# Patient Record
Sex: Female | Born: 1945 | Race: White | Hispanic: No | Marital: Single | Smoking: Former smoker
Health system: Southern US, Community
[De-identification: ages and names within clinical notes are randomized; demographics above are authoritative.]

## PROBLEM LIST (undated history)

## (undated) DIAGNOSIS — K648 Other hemorrhoids: Secondary | ICD-10-CM

## (undated) DIAGNOSIS — Z9289 Personal history of other medical treatment: Secondary | ICD-10-CM

## (undated) DIAGNOSIS — Z923 Personal history of irradiation: Secondary | ICD-10-CM

## (undated) DIAGNOSIS — F419 Anxiety disorder, unspecified: Secondary | ICD-10-CM

## (undated) DIAGNOSIS — K5790 Diverticulosis of intestine, part unspecified, without perforation or abscess without bleeding: Secondary | ICD-10-CM

## (undated) DIAGNOSIS — J45909 Unspecified asthma, uncomplicated: Secondary | ICD-10-CM

## (undated) DIAGNOSIS — I4819 Other persistent atrial fibrillation: Secondary | ICD-10-CM

## (undated) DIAGNOSIS — C3490 Malignant neoplasm of unspecified part of unspecified bronchus or lung: Secondary | ICD-10-CM

## (undated) DIAGNOSIS — J189 Pneumonia, unspecified organism: Secondary | ICD-10-CM

## (undated) DIAGNOSIS — Z8719 Personal history of other diseases of the digestive system: Secondary | ICD-10-CM

## (undated) DIAGNOSIS — I495 Sick sinus syndrome: Secondary | ICD-10-CM

## (undated) DIAGNOSIS — M1990 Unspecified osteoarthritis, unspecified site: Secondary | ICD-10-CM

## (undated) DIAGNOSIS — Z950 Presence of cardiac pacemaker: Secondary | ICD-10-CM

## (undated) DIAGNOSIS — Z87442 Personal history of urinary calculi: Secondary | ICD-10-CM

## (undated) DIAGNOSIS — K219 Gastro-esophageal reflux disease without esophagitis: Secondary | ICD-10-CM

## (undated) DIAGNOSIS — Z9221 Personal history of antineoplastic chemotherapy: Secondary | ICD-10-CM

## (undated) DIAGNOSIS — I480 Paroxysmal atrial fibrillation: Principal | ICD-10-CM

## (undated) DIAGNOSIS — R911 Solitary pulmonary nodule: Secondary | ICD-10-CM

## (undated) DIAGNOSIS — K8020 Calculus of gallbladder without cholecystitis without obstruction: Secondary | ICD-10-CM

## (undated) DIAGNOSIS — Z5112 Encounter for antineoplastic immunotherapy: Secondary | ICD-10-CM

## (undated) DIAGNOSIS — I4892 Unspecified atrial flutter: Secondary | ICD-10-CM

## (undated) DIAGNOSIS — J309 Allergic rhinitis, unspecified: Secondary | ICD-10-CM

## (undated) HISTORY — PX: ESOPHAGOGASTRODUODENOSCOPY: SHX1529

## (undated) HISTORY — DX: Encounter for antineoplastic immunotherapy: Z51.12

## (undated) HISTORY — DX: Paroxysmal atrial fibrillation: I48.0

## (undated) HISTORY — DX: Solitary pulmonary nodule: R91.1

## (undated) HISTORY — DX: Unspecified asthma, uncomplicated: J45.909

## (undated) HISTORY — DX: Calculus of gallbladder without cholecystitis without obstruction: K80.20

## (undated) HISTORY — PX: PARTIAL HYSTERECTOMY: SHX80

## (undated) HISTORY — DX: Unspecified atrial flutter: I48.92

## (undated) HISTORY — PX: COLONOSCOPY: SHX174

## (undated) HISTORY — DX: Allergic rhinitis, unspecified: J30.9

## (undated) HISTORY — DX: Personal history of urinary calculi: Z87.442

## (undated) HISTORY — DX: Other persistent atrial fibrillation: I48.19

## (undated) HISTORY — DX: Sick sinus syndrome: I49.5

---

## 2012-06-20 DIAGNOSIS — I4891 Unspecified atrial fibrillation: Secondary | ICD-10-CM

## 2012-06-22 ENCOUNTER — Other Ambulatory Visit: Payer: Self-pay | Admitting: *Deleted

## 2012-06-22 DIAGNOSIS — I480 Paroxysmal atrial fibrillation: Secondary | ICD-10-CM

## 2012-06-24 ENCOUNTER — Encounter (INDEPENDENT_AMBULATORY_CARE_PROVIDER_SITE_OTHER): Payer: Medicare Other | Admitting: *Deleted

## 2012-06-24 ENCOUNTER — Encounter: Payer: Self-pay | Admitting: *Deleted

## 2012-06-24 DIAGNOSIS — I4891 Unspecified atrial fibrillation: Secondary | ICD-10-CM

## 2012-06-24 DIAGNOSIS — I4892 Unspecified atrial flutter: Secondary | ICD-10-CM

## 2012-06-24 DIAGNOSIS — I480 Paroxysmal atrial fibrillation: Secondary | ICD-10-CM

## 2012-07-08 ENCOUNTER — Ambulatory Visit (INDEPENDENT_AMBULATORY_CARE_PROVIDER_SITE_OTHER): Payer: Medicare Other | Admitting: Physician Assistant

## 2012-07-08 ENCOUNTER — Encounter: Payer: Self-pay | Admitting: Physician Assistant

## 2012-07-08 VITALS — BP 147/80 | HR 70 | Ht 62.0 in | Wt 158.8 lb

## 2012-07-08 DIAGNOSIS — I4891 Unspecified atrial fibrillation: Secondary | ICD-10-CM

## 2012-07-08 DIAGNOSIS — I480 Paroxysmal atrial fibrillation: Secondary | ICD-10-CM

## 2012-07-08 MED ORDER — DILTIAZEM HCL ER COATED BEADS 120 MG PO CP24: 120.0000 mg | ORAL_CAPSULE | Freq: Every day | ORAL | Status: DC

## 2012-07-08 MED ORDER — ASPIRIN EC 81 MG PO TBEC: 81.0000 mg | DELAYED_RELEASE_TABLET | Freq: Every day | ORAL | Status: DC

## 2012-07-08 NOTE — Assessment & Plan Note (Addendum)
The results of the recent 48-hour Holter monitor were reviewed with Dr. Antoine Poche, who felt that the rhythm strips are most suggestive of atrial flutter. However, the initial twelve-lead EKG at [facility] was most suggestive of atrial fibrillation, at approximately 140 bpm. Given this, plan is to refer her to Dr. Johney Frame of our EP service, to discuss the various possible therapeutic options for her. Although we considered the possibility of RF ablation, we note that this may not be feasible, given documented strips which appeared to be more suggestive of atrial fibrillation. She may be a good candidate for flecainide therapy. Of note, she presents with no known history of heart disease, and we will screen with a baseline echocardiogram for assessment of EF and rule out of any underlying structural abnormalities. In the meanwhile, we will decrease Cardizem to 120 mg daily, in light of the noted multiple pauses. We will also start her on ASA 81 mg daily, given her CHADS score of 0. If echocardiogram is within normal limits, then no further workup is indicated and we will have her return to Dr. Antoine Poche, here in our [place] clinic, on an as-needed basis.

## 2012-07-08 NOTE — Patient Instructions (Addendum)
   Echo  Office will contact with results  Begin Aspirin 81mg  daily  Decrease caffeine  Decrease Cardizem CD to 120mg  daily  Refer to Dr. Johney Frame  Continue all current other medications.  Follow up as needed

## 2012-07-08 NOTE — Progress Notes (Signed)
Primary Cardiologist: Rollene Rotunda, MD (new)  HPI: Post hospital followup from [facility], seen in consultation for new onset PAF. Assessed a CHADS of 0, and discharged on calcium channel blocker. Recommendation was to pursue outpatient evaluation with a 48-hour Holter monitor to assess AF burden.   - Holter Monitor: Intermittent AF/flutter; max HR approximately 160 bpm; multiple post termination pauses of greater than 2.5 seconds, 1 pause of 3.1 seconds.  Since her recent rehospitalization, she denies any recurrent palpitations. Of note, she did not actually feel tachycardia palpitations at time of her presentation, but has had these symptoms intermittently in the past. She actually presented because of symptoms related to her sinusitis.  While wearing the Holter monitor, she did experience occasional "weak spells", but denied any frank syncope or falls.  Allergies  Allergen Reactions  . Penicillins   . Prednisone   . Septra (Sulfamethoxazole W-Trimethoprim)   . Vioxx (Rofecoxib)     Current Outpatient Prescriptions  Medication Sig Dispense Refill  . fluticasone (FLONASE) 50 MCG/ACT nasal spray Place 2 sprays into the nose daily.      [name] aspirin EC 81 MG tablet Take 1 tablet (81 mg total) by mouth daily.      [name] diltiazem (CARDIZEM CD) 120 MG 24 hr capsule Take 1 capsule (120 mg total) by mouth daily.  30 capsule  6    Past Medical History  Diagnosis Date  . Asthma   . History of sinusitis   . Allergic rhinitis   . History of kidney stones   . Paroxysmal atrial fibrillation     CHADS score: 0    Past Surgical History  Procedure Date  . Partial hysterectomy     History   Social History  . Marital Status: Divorced    Spouse Name: N/A    Number of Children: N/A  . Years of Education: N/A   Occupational History  . Not on file.   Social History Main Topics  . Smoking status: Former Smoker -- 2.0 packs/day for 40 years    Types: Cigarettes    Quit date: 08/04/1981  .  Smokeless tobacco: Not on file  . Alcohol Use: No  . Drug Use: No  . Sexually Active: Not on file   Other Topics Concern  . Not on file   Social History Narrative   Patient lives alonePatient has 1 living daughter     Family History  Problem Relation Age of Onset  . Heart disease Mother   . Cancer Mother   . Diabetes Mother   . Asthma Father   . Heart disease Father   . Cancer Brother   . Diabetes Sister   . Cancer Sister   . Diabetes Brother   . Heart attack Son     ROS: no nausea, vomiting; no fever, chills; no melena, hematochezia; no claudication  PHYSICAL EXAM: BP 147/80  Pulse 70  Ht 5\' 2"  (1.575 m)  Wt 158 lb 12.8 oz (72.031 kg)  BMI 29.04 kg/m2 GENERAL: 66 year old female; NAD HEENT: NCAT, PERRLA, EOMI; sclera clear; no xanthelasma NECK: palpable bilateral carotid pulses, no bruits; no JVD; no TM LUNGS: CTA bilaterally CARDIAC: RRR (S1, S2); no significant murmurs; no rubs or gallops ABDOMEN: Protuberant EXTREMETIES: no significant peripheral edema SKIN: warm/dry; no obvious rash/lesions MUSCULOSKELETAL: no joint deformity NEURO: no focal deficit; NL affect   EKG: reviewed and available in Electronic Records   ASSESSMENT & PLAN:  Paroxysmal atrial fibrillation The results of the recent 48-hour Holter monitor were  reviewed with Dr. Antoine Poche, who felt that the rhythm strips are most suggestive of atrial flutter. However, the initial twelve-lead EKG at [facility] was most suggestive of atrial fibrillation, at approximately 140 bpm. Given this, plan is to refer her to Dr. Johney Frame of our EP service, to discuss the various possible therapeutic options for her. Although we considered the possibility of RF ablation, we note that this may not be feasible, given documented strips which appeared to be more suggestive of atrial fibrillation. She may be a good candidate for flecainide therapy. Of note, she presents with no known history of heart disease, and we  will screen with a baseline echocardiogram for assessment of EF and rule out of any underlying structural abnormalities. In the meanwhile, we will decrease Cardizem to 120 mg daily, in light of the noted multiple pauses. We will also start her on ASA 81 mg daily, given her CHADS score of 0. If echocardiogram is within normal limits, then no further workup is indicated and we will have her return to Dr. Antoine Poche, here in our [place] clinic, on an as-needed basis.    Gene Melodie Ashworth, PAC

## 2012-07-12 ENCOUNTER — Other Ambulatory Visit: Payer: Self-pay

## 2012-08-05 ENCOUNTER — Other Ambulatory Visit (INDEPENDENT_AMBULATORY_CARE_PROVIDER_SITE_OTHER): Payer: Medicaid Other

## 2012-08-05 ENCOUNTER — Other Ambulatory Visit: Payer: Self-pay

## 2012-08-05 DIAGNOSIS — I480 Paroxysmal atrial fibrillation: Secondary | ICD-10-CM

## 2012-08-05 DIAGNOSIS — I4891 Unspecified atrial fibrillation: Secondary | ICD-10-CM

## 2012-08-06 ENCOUNTER — Telehealth: Payer: Self-pay | Admitting: *Deleted

## 2012-08-06 NOTE — Telephone Encounter (Signed)
Message copied by Lesle Chris on Fri Aug 06, 2012 10:51 AM ------      Message from: Nancy Hodges      Created: Thu Aug 05, 2012  1:57 PM       NL LVF. No further w/u. Pt to f/u with [facility] on prn basis.

## 2012-08-06 NOTE — Telephone Encounter (Signed)
Notes Recorded by Lesle Chris, LPN on 08/09/1094 at 10:51 AM Patient notified. ------  Notes Recorded by Lesle Chris, LPN on 0/11/5407 at 9:28 AM Left message to return call.

## 2012-08-20 ENCOUNTER — Institutional Professional Consult (permissible substitution): Payer: Medicare Other | Admitting: Internal Medicine

## 2012-09-06 ENCOUNTER — Encounter: Payer: Self-pay | Admitting: Internal Medicine

## 2012-09-06 ENCOUNTER — Ambulatory Visit (INDEPENDENT_AMBULATORY_CARE_PROVIDER_SITE_OTHER): Payer: Medicare Other | Admitting: Internal Medicine

## 2012-09-06 VITALS — BP 114/68 | HR 76 | Ht 62.0 in | Wt 162.0 lb

## 2012-09-06 DIAGNOSIS — I480 Paroxysmal atrial fibrillation: Secondary | ICD-10-CM

## 2012-09-06 DIAGNOSIS — I4891 Unspecified atrial fibrillation: Secondary | ICD-10-CM

## 2012-09-06 DIAGNOSIS — I495 Sick sinus syndrome: Secondary | ICD-10-CM | POA: Insufficient documentation

## 2012-09-06 NOTE — Assessment & Plan Note (Signed)
The patient has minimally symptomatic atrial fibrillation and atrial flutter.  Recent post termination pauses have made rate control difficult.  She is on low dose metoprolol at this time.  Though her CHADS2 score is 0, her CHASDS2VASC score is 2 (female, age >70).  We has a long discussion today regarding the need for anticoagulation.  She is very clear in her decision to decline anticoagulation going forward. Given her paucity of symptoms, I do not think that she would receive benefit from an AAD or ablation presently.

## 2012-09-06 NOTE — Progress Notes (Signed)
Primary Care Physician: [facility] Referring Physician:  Gene Serpe   Nancy Hodges is a 67 y.o. female with a h/o recently diagnosed atrial fibrillation who presents today for EP consultation.  She reports that in October that she developed a URI with cough.  She presented to [facility] and was found to have afib with RVR.  She was admitted to [facility] for medical therapy.  She was treated with diltiazem for rate control.  She had a holter monitor placed which documented intermittent atrial fibrillation and atrial flutter with rapid ventricular rates up to 160 bpm as well as several pauses up to 3.1 seconds. She denies presyncope or syncope.  She had several "weak" spells lasting several seconds likely due to pauses.   Her diltiazem was decreased to 120mg  daily.  The "weak spells" have stopped since that time.  Today, she denies symptoms of palpitations, chest pain, shortness of breath, orthopnea, PND, lower extremity edema, or neurologic sequela. The patient is tolerating medications without difficulties and is otherwise without complaint today.   Past Medical History  Diagnosis Date  . Asthma   . History of sinusitis   . Allergic rhinitis   . History of kidney stones   . Paroxysmal atrial fibrillation     CHADS score: 0  . Atrial flutter    Past Surgical History  Procedure Date  . Partial hysterectomy     Current Outpatient Prescriptions  Medication Sig Dispense Refill  . aspirin EC 81 MG tablet Take 1 tablet (81 mg total) by mouth daily.      [name] diltiazem (CARDIZEM CD) 120 MG 24 hr capsule Take 1 capsule (120 mg total) by mouth daily.  30 capsule  6  . fluticasone (FLONASE) 50 MCG/ACT nasal spray Place 2 sprays into the nose daily as needed.         Allergies  Allergen Reactions  . Penicillins   . Prednisone   . Septra (Sulfamethoxazole W-Trimethoprim)   . Vioxx (Rofecoxib)     History   Social History    . Marital Status: Divorced    Spouse Name: N/A    Number of Children: N/A  . Years of Education: N/A   Occupational History  . Not on file.   Social History Main Topics  . Smoking status: Former Smoker -- 2.0 packs/day for 40 years    Types: Cigarettes    Quit date: 08/04/1981  . Smokeless tobacco: Not on file  . Alcohol Use: No  . Drug Use: No  . Sexually Active: Not on file   Other Topics Concern  . 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  Divorced.Patient has 1 living daughter     Family History  Problem Relation Age of Onset  . Heart disease Mother   . Cancer Mother   . Diabetes Mother   . Asthma Father   . Heart disease Father   . Cancer Brother   . Diabetes Sister   . Cancer Sister   . Diabetes Brother   . Heart attack Son     ROS- All systems are reviewed and negative except as per the HPI above  Physical Exam: Filed Vitals:   09/06/12 1026  BP: 114/68  Pulse: 76  Height: 5\' 2"  (1.575 m)  Weight: 162 lb (73.483 kg)    GEN- The patient is well appearing, alert and oriented x 3 today.  Head- normocephalic, atraumatic Eyes-  Sclera clear, conjunctiva pink Ears- hearing intact Oropharynx- clear Neck- supple, no JVP Lymph- no cervical lymphadenopathy Lungs- Clear to ausculation bilaterally, normal work of breathing Heart- irregular rate and rhythm, no murmurs, rubs or gallops, PMI not laterally displaced GI- soft, NT, ND, + BS Extremities- no clubbing, cyanosis, or edema MS- no significant deformity or atrophy Skin- no rash or lesion Psych- euthymic mood, full affect Neuro- strength and sensation are intact  Echo, EKG and event monitor are reviewed in epic  Assessment and Plan:

## 2012-09-06 NOTE — Patient Instructions (Addendum)
Your physician recommends that you schedule a follow-up appointment in: 2 months with Dr Allred  

## 2012-09-06 NOTE — Assessment & Plan Note (Signed)
Recent holter documents afib with RVR as well as post termination pauses.  She has had pauses of 3 seconds which have made rate control difficult.  She reports brief "weakness" with these events but has not had frank syncope. I have strongly recommended pacemaker implantation at this time.  I have offered enrollment in our Leadless II pacemaker trial as I think that VVI backup pacing would be sufficient to resolve pauses and allow for adequate rate control of her afib.  She is clear in her decision to decline any EP procedures at this point. I have instructed her to contact me immediately should she develop worsening presyncope or syncope.  She voices understanding and willingness to comply.  She is aware of risks of syncope and injury with pauses. I will make no medicine changes today.

## 2012-09-07 ENCOUNTER — Telehealth: Payer: Self-pay | Admitting: Internal Medicine

## 2012-09-07 NOTE — Telephone Encounter (Signed)
Tachycardia-bradycardia syndrome - Hillis Range, MD 09/06/2012 10:03 PM Signed  Recent holter documents afib with RVR as well as post termination pauses. She has had pauses of 3 seconds which have made rate control difficult. She reports brief "weakness" with these events but has not had frank syncope.  I have strongly recommended pacemaker implantation at this time. I have offered enrollment in our Leadless II pacemaker trial as I think that VVI backup pacing would be sufficient to resolve pauses and allow for adequate rate control of her afib. She is clear in her decision to decline any EP procedures at this point.  I have instructed her to contact me immediately should she develop worsening presyncope or syncope. She voices understanding and willingness to comply. She is aware of risks of syncope and injury with pauses.  I will make no medicine changes today.    I have called patient and lmom for her that I will call her back on Thurs after I discuss with Dr Johney Frame and get her plan of care

## 2012-09-07 NOTE — Telephone Encounter (Signed)
New Problem:    Patient called in wanting to schedule her ICD implant procedure as soon as possible.  Please call back.

## 2012-09-13 NOTE — Telephone Encounter (Signed)
Patient is going to be at the hospital tomorrow for the leadless PPM implant at 1

## 2012-09-13 NOTE — Telephone Encounter (Signed)
Follow-up:    Patient called in to follow-up on her previous call.  Please call back. After 2pm please call [phone].

## 2012-09-13 NOTE — Telephone Encounter (Signed)
lmom for patient to call me back. 

## 2012-09-14 ENCOUNTER — Encounter (HOSPITAL_COMMUNITY)
Admission: RE | Disposition: A | Discharge status: Home / Self Care | Payer: Self-pay | Source: Ambulatory Visit | Attending: Internal Medicine

## 2012-09-14 ENCOUNTER — Other Ambulatory Visit: Payer: Self-pay | Admitting: *Deleted

## 2012-09-14 ENCOUNTER — Encounter (HOSPITAL_COMMUNITY): Payer: Self-pay | Admitting: *Deleted

## 2012-09-14 ENCOUNTER — Ambulatory Visit (HOSPITAL_COMMUNITY)
Admission: RE | Admit: 2012-09-14 | Discharge: 2012-09-15 | Disposition: A | Discharge status: Home / Self Care | Payer: Medicare Other | Source: Ambulatory Visit | Attending: Internal Medicine | Admitting: Internal Medicine

## 2012-09-14 DIAGNOSIS — Z23 Encounter for immunization: Secondary | ICD-10-CM | POA: Insufficient documentation

## 2012-09-14 DIAGNOSIS — I495 Sick sinus syndrome: Secondary | ICD-10-CM

## 2012-09-14 DIAGNOSIS — I4891 Unspecified atrial fibrillation: Secondary | ICD-10-CM

## 2012-09-14 DIAGNOSIS — I4892 Unspecified atrial flutter: Secondary | ICD-10-CM | POA: Insufficient documentation

## 2012-09-14 DIAGNOSIS — I480 Paroxysmal atrial fibrillation: Secondary | ICD-10-CM | POA: Diagnosis present

## 2012-09-14 DIAGNOSIS — J45909 Unspecified asthma, uncomplicated: Secondary | ICD-10-CM | POA: Insufficient documentation

## 2012-09-14 DIAGNOSIS — R911 Solitary pulmonary nodule: Secondary | ICD-10-CM | POA: Insufficient documentation

## 2012-09-14 HISTORY — DX: Personal history of other diseases of the digestive system: Z87.19

## 2012-09-14 HISTORY — PX: PERMANENT PACEMAKER INSERTION: SHX5480

## 2012-09-14 HISTORY — DX: Gastro-esophageal reflux disease without esophagitis: K21.9

## 2012-09-14 LAB — CBC
MCH: 32.9 pg (ref 26.0–34.0)
MCHC: 35.7 g/dL (ref 30.0–36.0)
MCV: 92.1 fL (ref 78.0–100.0)
Platelets: 244 10*3/uL (ref 150–400)
RBC: 4.32 MIL/uL (ref 3.87–5.11)
RDW: 12.3 % (ref 11.5–15.5)

## 2012-09-14 LAB — BASIC METABOLIC PANEL
CO2: 25 mEq/L (ref 19–32)
Calcium: 9.5 mg/dL (ref 8.4–10.5)
Creatinine, Ser: 0.54 mg/dL (ref 0.50–1.10)

## 2012-09-14 LAB — PROTIME-INR: Prothrombin Time: 13.2 seconds (ref 11.6–15.2)

## 2012-09-14 SURGERY — PERMANENT PACEMAKER INSERTION
Anesthesia: LOCAL

## 2012-09-14 MED ORDER — MIDAZOLAM HCL 5 MG/5ML IJ SOLN
INTRAMUSCULAR | Status: AC
  Filled 2012-09-14: qty 5

## 2012-09-14 MED ORDER — SODIUM CHLORIDE 0.9 % IV SOLN
INTRAVENOUS | Status: DC
  Administered 2012-09-14: 22:00:00 via INTRAVENOUS

## 2012-09-14 MED ORDER — HEPARIN (PORCINE) IN NACL 2-0.9 UNIT/ML-% IJ SOLN
INTRAMUSCULAR | Status: AC
  Filled 2012-09-14: qty 2000

## 2012-09-14 MED ORDER — FENTANYL CITRATE 0.05 MG/ML IJ SOLN
INTRAMUSCULAR | Status: AC
  Filled 2012-09-14: qty 2

## 2012-09-14 MED ORDER — ONDANSETRON HCL 4 MG/2ML IJ SOLN: 4.0000 mg | Freq: Four times a day (QID) | INTRAMUSCULAR | Status: DC | PRN

## 2012-09-14 MED ORDER — MUPIROCIN 2 % EX OINT
TOPICAL_OINTMENT | CUTANEOUS | Status: AC
  Filled 2012-09-14: qty 22

## 2012-09-14 MED ORDER — PNEUMOCOCCAL VAC POLYVALENT 25 MCG/0.5ML IJ INJ
0.5000 mL | INJECTION | INTRAMUSCULAR | Status: AC
  Administered 2012-09-15: 0.5 mL via INTRAMUSCULAR
  Filled 2012-09-14 (×2): qty 0.5

## 2012-09-14 MED ORDER — MUPIROCIN 2 % EX OINT: TOPICAL_OINTMENT | Freq: Two times a day (BID) | CUTANEOUS | Status: DC

## 2012-09-14 MED ORDER — DILTIAZEM HCL ER COATED BEADS 120 MG PO CP24
120.0000 mg | ORAL_CAPSULE | Freq: Every day | ORAL | Status: DC
  Administered 2012-09-15: 09:00:00 120 mg via ORAL
  Filled 2012-09-14 (×2): qty 1

## 2012-09-14 MED ORDER — VANCOMYCIN HCL IN DEXTROSE 1-5 GM/200ML-% IV SOLN
1000.0000 mg | INTRAVENOUS | Status: DC
  Filled 2012-09-14: qty 200

## 2012-09-14 MED ORDER — SODIUM CHLORIDE 0.45 % IV SOLN
INTRAVENOUS | Status: DC
  Administered 2012-09-14: 13:00:00 via INTRAVENOUS

## 2012-09-14 MED ORDER — BUPIVACAINE HCL (PF) 0.25 % IJ SOLN
INTRAMUSCULAR | Status: AC
  Filled 2012-09-14: qty 60

## 2012-09-14 MED ORDER — VANCOMYCIN HCL IN DEXTROSE 1-5 GM/200ML-% IV SOLN
INTRAVENOUS | Status: AC
  Filled 2012-09-14: qty 200

## 2012-09-14 MED ORDER — HYDROCODONE-ACETAMINOPHEN 5-325 MG PO TABS: 1.0000 | ORAL_TABLET | ORAL | Status: DC | PRN

## 2012-09-14 MED ORDER — VANCOMYCIN HCL IN DEXTROSE 1-5 GM/200ML-% IV SOLN
1000.0000 mg | Freq: Two times a day (BID) | INTRAVENOUS | Status: AC
  Administered 2012-09-14: 23:00:00 1000 mg via INTRAVENOUS
  Filled 2012-09-14: qty 200

## 2012-09-14 MED ORDER — ADULT MULTIVITAMIN W/MINERALS CH
1.0000 | ORAL_TABLET | Freq: Every day | ORAL | Status: DC
  Administered 2012-09-14 – 2012-09-15 (×2): 1 via ORAL
  Filled 2012-09-14 (×3): qty 1

## 2012-09-14 MED ORDER — ACETAMINOPHEN 325 MG PO TABS
325.0000 mg | ORAL_TABLET | ORAL | Status: DC | PRN
  Administered 2012-09-15: 650 mg via ORAL
  Filled 2012-09-14: qty 2

## 2012-09-14 NOTE — Interval H&P Note (Signed)
History and Physical Interval Note:  09/14/2012 2:35 PM  Nancy Hodges  has presented today for surgery, with the diagnosis of tachycardia bradycardia syndrome.  The various methods of treatment have been discussed with the patient and family. After consideration of risks, benefits and other options for treatment, the patient has consented to  Procedure(s): PERMANENT PACEMAKER INSERTION (N/A) as a surgical intervention .  The patient's history has been reviewed, patient examined, no change in status, stable for surgery.  I have reviewed the patient's chart and labs.  Questions were answered to the patient's satisfaction.    She continues to have symptoms of both tachycardia and bradycardia.  Pacemaker implantation is therefore necessary at this time.  I discussed both traditional and leadless pacemaker technology. Risks, benefits, alternatives to leadless pacemaker implantation were discussed in detail with the patient today. The patient understands that the risks include but are not limited to bleeding, infection, pneumothorax, perforation, tamponade, vascular damage, renal failure, MI, stroke, death,  and device dislodgement and wishes to proceed. We will therefore proceed at this time.  Hillis Range

## 2012-09-14 NOTE — H&P (View-Only) (Signed)
 Primary Care Physician: [facility] Referring Physician:  Gene Serpe   Nancy Hodges is a 66 y.o. female with a h/o recently diagnosed atrial fibrillation who presents today for EP consultation.  She reports that in October that she developed a URI with cough.  She presented to [facility] and was found to have afib with RVR.  She was admitted to [facility] for medical therapy.  She was treated with diltiazem for rate control.  She had a holter monitor placed which documented intermittent atrial fibrillation and atrial flutter with rapid ventricular rates up to 160 bpm as well as several pauses up to 3.1 seconds. She denies presyncope or syncope.  She had several "weak" spells lasting several seconds likely due to pauses.   Her diltiazem was decreased to 120mg daily.  The "weak spells" have stopped since that time.  Today, she denies symptoms of palpitations, chest pain, shortness of breath, orthopnea, PND, lower extremity edema, or neurologic sequela. The patient is tolerating medications without difficulties and is otherwise without complaint today.   Past Medical History  Diagnosis Date  . Asthma   . History of sinusitis   . Allergic rhinitis   . History of kidney stones   . Paroxysmal atrial fibrillation     CHADS score: 0  . Atrial flutter    Past Surgical History  Procedure Date  . Partial hysterectomy     Current Outpatient Prescriptions  Medication Sig Dispense Refill  . aspirin EC 81 MG tablet Take 1 tablet (81 mg total) by mouth daily.      . diltiazem (CARDIZEM CD) 120 MG 24 hr capsule Take 1 capsule (120 mg total) by mouth daily.  30 capsule  6  . fluticasone (FLONASE) 50 MCG/ACT nasal spray Place 2 sprays into the nose daily as needed.         Allergies  Allergen Reactions  . Penicillins   . Prednisone   . Septra (Sulfamethoxazole W-Trimethoprim)   . Vioxx (Rofecoxib)     History   Social History    . Marital Status: Divorced    Spouse Name: N/A    Number of Children: N/A  . Years of Education: N/A   Occupational History  . Not on file.   Social History Main Topics  . Smoking status: Former Smoker -- 2.0 packs/day for 40 years    Types: Cigarettes    Quit date: 08/04/1981  . Smokeless tobacco: Not on file  . Alcohol Use: No  . Drug Use: No  . Sexually Active: Not on file   Other Topics Concern  . Not on file   Social History Narrative   Patient lives alone in [place].Retired from [employer].  Divorced.Patient has 1 living daughter     Family History  Problem Relation Age of Onset  . Heart disease Mother   . Cancer Mother   . Diabetes Mother   . Asthma Father   . Heart disease Father   . Cancer Brother   . Diabetes Sister   . Cancer Sister   . Diabetes Brother   . Heart attack Son     ROS- All systems are reviewed and negative except as per the HPI above  Physical Exam: Filed Vitals:   09/06/12 1026  BP: 114/68  Pulse: 76  Height: 5' 2" (1.575 m)  Weight: 162 lb (73.483 kg)    GEN- The patient is well appearing, alert and oriented x 3 today.     Head- normocephalic, atraumatic Eyes-  Sclera clear, conjunctiva pink Ears- hearing intact Oropharynx- clear Neck- supple, no JVP Lymph- no cervical lymphadenopathy Lungs- Clear to ausculation bilaterally, normal work of breathing Heart- irregular rate and rhythm, no murmurs, rubs or gallops, PMI not laterally displaced GI- soft, NT, ND, + BS Extremities- no clubbing, cyanosis, or edema MS- no significant deformity or atrophy Skin- no rash or lesion Psych- euthymic mood, full affect Neuro- strength and sensation are intact  Echo, EKG and event monitor are reviewed in epic  Assessment and Plan:  

## 2012-09-14 NOTE — Op Note (Signed)
SURGEON: Hillis Range, MD  Assistant:  Lewayne Bunting, MD  PREPROCEDURE DIAGNOSIS: Symptomatic tachycardia bradycardia syndrome, atrial fibrillation   POSTPROCEDURE DIAGNOSIS: Symptomatic tachycardia bradycardia syndrome, atrial fibrillation   PROCEDURES:  1. Right Ventriculogram.  2. Leadless Pacemaker implantation.   INTRODUCTION:  Nancy Hodges is a 67 y.o. female with a history of symptomatic tachycardia bradycardia syndrome and atrial fibrillation who presents today for pacemaker implantation. The patient therefore presents today for pacemaker implantation.   DESCRIPTION OF PROCEDURE: Informed written consent was obtained, and the patient was brought to the electrophysiology lab in a fasting state. The patient received IV Versed and Fentanyl as sedation for the procedure today. Using a percutaneous Seldinger technique, an 8-French was placed into the right Hodges femoral vein.  A venogram of the right ventricle was performed by hand injection of nonionic contrast. This demonstrated a rather normal appearing right ventricle. The 8 french sheath was exchanged for an 18 french sheath. A [employer] Nanostim Leadless Cardiac Pacemaker model [id number]  (SN [id number]) was advanced through the right femoral vein into the right ventricular apex position and actively fixed to the myocardial wall. In this location pacing threshold was initially elevated (> 2 volts).  Two repositioning attempts were required.  A third more inferior apical position was then found.  In this location, R waves measured >11mV with an impedance of 712 Ohms and a threshold of 0.7V@0 . . Stability of the device was demonstrated with a "tug test". The device was disconnected from the implantation apparatus which was then removed from the body and the sheaths were aspirated and flushed. The sheaths were removed and hemostasis was assured. There were no early apparent complications.   CONCLUSIONS:  1.Successful implantation of a  SJM nannostim VVI investigational pacemaker.  2. No early apparent complications.   Hillis Range, MD  09/14/2012  6:19 PM

## 2012-09-15 ENCOUNTER — Ambulatory Visit (HOSPITAL_COMMUNITY): Payer: Medicare Other

## 2012-09-15 DIAGNOSIS — I495 Sick sinus syndrome: Secondary | ICD-10-CM

## 2012-09-15 MED ORDER — DILTIAZEM HCL ER COATED BEADS 120 MG PO CP24: 240.0000 mg | ORAL_CAPSULE | Freq: Every day | ORAL | Status: DC

## 2012-09-15 MED ORDER — DIPHENHYDRAMINE HCL 12.5 MG/5ML PO ELIX
12.5000 mg | ORAL_SOLUTION | Freq: Three times a day (TID) | ORAL | Status: DC | PRN
  Filled 2012-09-15: qty 5

## 2012-09-15 MED ORDER — YOU HAVE A PACEMAKER BOOK
Freq: Once | Status: AC
  Administered 2012-09-15: 06:00:00
  Filled 2012-09-15: qty 1

## 2012-09-15 NOTE — Discharge Instructions (Addendum)
No heavy lifting or strenuous activity for 1 week. No driving for 72 hoursDiltiazem tablets What is this medicine? DILTIAZEM (dil TYE a zem) is a calcium-channel blocker. It affects the amount of calcium found in your heart and muscle cells. This relaxes your blood vessels, which can reduce the amount of work the heart has to do. This medicine is used to treat chest pain caused by angina. This medicine may be used for other purposes; ask your health care provider or pharmacist if you have questions. What should I tell my health care provider before I take this medicine? They need to know if you have any of these conditions: -heart problems, low blood pressure, irregular heartbeat -liver disease -previous heart attack -an unusual or allergic reaction to diltiazem, other medicines, foods, dyes, or preservatives -pregnant or trying to get pregnant -breast-feeding How should I use this medicine? Take this medicine by mouth with a glass of water. Follow the directions on the prescription label. This medicine is usually taken before meals and at bedtime. Take your doses at regular intervals. Do not take your medicine more often then directed. Do not stop taking except on the advice of your doctor or health care professional. Talk to your pediatrician regarding the use of this medicine in children. Special care may be needed. Overdosage: If you think you have taken too much of this medicine contact a poison control center or emergency room at once. NOTE: This medicine is only for you. Do not share this medicine with others. What if I miss a dose? If you miss a dose, take it as soon as you can. If it is almost time for your next dose, take only that dose. Do not take double or extra doses. What may interact with this medicine? Do not take this medicine with any of the following: -cisapride -hawthorn -pimozide -ranolazine -red yeast rice This medicine may also interact with the following  medications: -buspirone -carbamazepine -cimetidine -cyclosporine -digoxin -local anesthetics or general anesthetics -lovastatin -medicines for anxiety or difficulty sleeping like midazolam and triazolam -medicines for high blood pressure or heart problems -quinidine -rifampin, rifabutin, or rifapentine This list may not describe all possible interactions. Give your health care provider a list of all the medicines, herbs, non-prescription drugs, or dietary supplements you use. Also tell them if you smoke, drink alcohol, or use illegal drugs. Some items may interact with your medicine. What should I watch for while using this medicine? Check your blood pressure and pulse rate regularly. Ask your doctor or health care professional what your blood pressure and pulse rate should be and when you should contact him or her. You may feel dizzy or lightheaded. Do not drive, use machinery, or do anything that needs mental alertness until you know how this medicine affects you. To reduce the risk of dizzy or fainting spells, do not sit or stand up quickly, especially if you are an older patient. Alcohol can make you more dizzy or increase flushing and rapid heartbeats. Avoid alcoholic drinks. What side effects may I notice from receiving this medicine? Side effects that you should report to your doctor or health care professional as soon as possible: -allergic reactions like skin rash, itching or hives, swelling of the face, lips, or tongue -confusion, mental depression -feeling faint or lightheaded, falls -pinpoint red spots on the skin -redness, blistering, peeling or loosening of the skin, including inside the mouth -slow, irregular heartbeat -swelling of the ankles, feet -unusual bleeding or bruising Side effects that usually  do not require medical attention (report to your doctor or health care professional if they continue or are bothersome): -change in sex drive or performance -constipation or  diarrhea -flushing of the face -headache -nausea, vomiting -tired or weak -trouble sleeping This list may not describe all possible side effects. Call your doctor for medical advice about side effects. You may report side effects to FDA at 1-800-FDA-1088. Where should I keep my medicine? Keep out of the reach of children. Store at room temperature between 20 and 25 degrees C (68 and 77 degrees F). Protect from light. Keep container tightly closed. Throw away any unused medicine after the expiration date. NOTE: This sheet is a summary. It may not cover all possible information. If you have questions about this medicine, talk to your doctor, pharmacist, or health care provider.  2012, [employer]. (11/11/2007 2:33:30 PM)

## 2012-09-15 NOTE — Progress Notes (Signed)
Utilization Review Completed Nevelyn Mellott J. Harvey Matlack, RN, BSN, NCM [phone]  

## 2012-09-15 NOTE — Progress Notes (Addendum)
   ELECTROPHYSIOLOGY ROUNDING NOTE    Patient Name: Nancy Hodges Date of Encounter: 09-15-2012    SUBJECTIVE:Patient feels well.  No chest pain or shortness of breath.  S/p leadless cardiac pacemaker 09-14-2012.    TELEMETRY: Reviewed telemetry pt in sinus rhythm:  Physical Exam: Filed Vitals:   09/15/12 0009 09/15/12 0027 09/15/12 0521 09/15/12 0524  BP:  125/55  129/65  Pulse:      Temp: 97.8 F (36.6 C)  97.6 F (36.4 C)   TempSrc: Oral  Oral   Resp:  18  21  Height: 5\' 2"  (1.575 m)     Weight: 158 lb 4.6 oz (71.8 kg)     SpO2: 96%  99%     GEN- The patient is well appearing, alert and oriented x 3 today.   Head- normocephalic, atraumatic Eyes-  Sclera clear, conjunctiva pink Ears- hearing intact Oropharynx- clear Neck- supple, no JVP Lymph- no cervical lymphadenopathy Lungs- Clear to ausculation bilaterally, normal work of breathing Heart- Regular rate and rhythm, no murmurs, rubs or gallops, PMI not laterally displaced GI- soft, NT, ND, + BS Extremities- no clubbing, cyanosis, or edema MS- no significant deformity or atrophy Skin- no rash or lesion Psych- euthymic mood, full affect Neuro- strength and sensation are intact  LABS: Basic Metabolic Panel:  Recent Labs  16/10/96 1306  NA 138  K 4.7  CL 103  CO2 25  GLUCOSE 149*  BUN 11  CREATININE 0.54  CALCIUM 9.5   CBC:  Recent Labs  09/14/12 1306  WBC 6.4  HGB 14.2  HCT 39.8  MCV 92.1  PLT 244    Radiology/Studies:  Final result pending.  LCP in stable position  DEVICE INTERROGATION: Device interrogation reviewed and normal  1. Tachy/brady syndrome 2. Afib Medication up-titration had been limited by presence of pre-syncope and documented pauses.  Pacemaker therefore implanted for tachy-brady syndrome.  She is doing very well s/p Leadless VVI pacemaker implantation. I will increase diltiazem to Cardizem CD 240mg  daily. Consider starting anticoagulation on follow-up wound  check.  Wound care, restrictions reviewed with patient (no driving 3-4 days, no stairs for 3-4 days, no exertional activity 3-4 days).  Follow up per research protocol.   DC to home today Jarold Song

## 2012-09-15 NOTE — Progress Notes (Signed)
Vancomycin infusion completed and pt c/o itching to left hand lateral to iv site.  Pt c/o hand "red" but skin pink in color no different than right hand.  Pt visibly scratching.  IV site flushed well with NS.  Pt states benadryl "knocks her out" and refused to take.  Was going to get hydrocortisone cream per cardiology prn order but contraindicated due to reported prednisone allergy.  Pt requested benadryl cream.  Called Dr. Terressa Koyanagi, he advised to give 12.5 mg benadryl since benadryl cream would not work if it is a medication reaction.  Advised pt RN could give half the usual dose of benadryl but pt states "not itching anymore."  Will continue to monitor.  Tylenol given for c/o headache. BP when sleeping was 91/65.  Once awake and mildly anxious bp recheck 125/55.  Rt groin level 0, denies groin or flank tenderness.

## 2012-09-15 NOTE — Discharge Summary (Addendum)
ELECTROPHYSIOLOGY PROCEDURE DISCHARGE SUMMARY    Patient ID: Nancy Hodges,  MRN: [id number], DOB/AGE: [birth year] 69 y.o.  Admit date: 09/14/2012 Discharge date: 09/15/2012  Primary Care Physician: [facility] Primary Cardiologist: Rozell Searing, [place]  Primary Discharge Diagnosis:  Tachy-brady syndrome status post leadless cardiac pacemaker this admission.   Secondary Discharge Diagnosis:  1. Atrial fibrillation  2. Asthma  Procedures This Admission:  1.  Implantation of a leadless cardiac pacemaker on 09-14-2012 by Dr Johney Frame.  The patient received at [facility] pacemaker implanted in the RV apex.  There were no early apparent complications.  2.  CXR on 09-15-2012 demonstrated stable placement of pacemaker.  There was a small RUL nodule observed.  Brief HPI: Nancy Hodges is a 67 year old female who has recently been found to have symptomatic tachy-brady syndrome with rates up to 160 bpm and pauses of 3 seconds.  She was referred to Dr Johney Frame and pacemaker implantation was recommended.  She was evaluated for the Leadless II pacemaker study.  Risks, benefits, and alternatives were reviewed with the patient who wished to proceed.    Hospital Course:  The patient was admitted and underwent implantation of a leadless cardiac pacemaker on 09-14-2012 by Dr Johney Frame.  CXR was obtained which demonstrated stable appearance of pacemaker.  She was monitored on telemetry overnight which demonstrated sinus rhythm.  Device was interrogated and found to be functioning normally.  Her Diltiazem was increased by Dr Johney Frame prior to discharge.  She had previously declined anti-coagulation but is now willing to consider.  Dr Johney Frame will discuss anti-coagulation with patient at two week follow up visit.  She was evaluated by Dr Johney Frame and considered stable for discharge to home.   She was observed to have a RUL lung nodule on CXR.  She will need a CT scan arranged when she returns for follow-up  in 2 weeks.  Discharge Vitals: Blood pressure 129/65, pulse 81, temperature 97.6 F (36.4 C), temperature source Oral, resp. rate 21, height 5\' 2"  (1.575 m), weight 158 lb 4.6 oz (71.8 kg), SpO2 99.00%.    Labs:   Lab Results  Component Value Date   WBC 6.4 09/14/2012   HGB 14.2 09/14/2012   HCT 39.8 09/14/2012   MCV 92.1 09/14/2012   PLT 244 09/14/2012     Recent Labs Lab 09/14/12 1306  NA 138  K 4.7  CL 103  CO2 25  BUN 11  CREATININE 0.54  CALCIUM 9.5  GLUCOSE 149*    Discharge Medications:    Medication List    TAKE these medications       aspirin EC 81 MG tablet  Take 1 tablet (81 mg total) by mouth daily.     diltiazem 120 MG 24 hr capsule  Commonly known as:  CARDIZEM CD  Take 2 capsules (240 mg total) by mouth daily.     fluticasone 50 MCG/ACT nasal spray  Commonly known as:  FLONASE  Place 2 sprays into the nose daily as needed.     multivitamin with minerals Tabs  Take 1 tablet by mouth daily.        Disposition:   Future Appointments Provider Department Dept Phone   09/29/2012 11:30 AM Hillis Range, MD [facility] Main Office [place]) [phone]   11/08/2012 10:00 AM Hillis Range, MD [facility] Main Office [place]) [phone]     Follow-up Information   Follow up with Hillis Range, MD On 09/29/2012. (at 11:30am)    Contact  information:   [address], [address] [place] [address] [phone]       Duration of Discharge Encounter: Greater than 30 minutes including physician time.  Signed, Jarold Song

## 2012-09-15 NOTE — Research (Signed)
Leadless II Informed Consent   Subject Name: Nancy Hodges  Subject met inclusion and exclusion criteria.  The informed consent form, study requirements and expectations were reviewed with the subject and questions and concerns were addressed prior to the signing of the consent form.  The subject verbalized understanding of the trail requirements.  The subject agreed to participate in the Leadless II trial and signed the informed consent.  The informed consent was obtained prior to performance of any protocol-specific procedures for the subject.  A copy of the signed informed consent was given to the subject and a copy was placed in the subject's medical record.  Loletha Carrow 09/14/12 14:50

## 2012-09-16 ENCOUNTER — Telehealth: Payer: Self-pay | Admitting: Internal Medicine

## 2012-09-16 DIAGNOSIS — R911 Solitary pulmonary nodule: Secondary | ICD-10-CM

## 2012-09-16 NOTE — Telephone Encounter (Signed)
I informed patient of the nodule seen on Chest Xray. She will need noncontrast CT of the chest on 09/29/12.

## 2012-09-21 NOTE — Telephone Encounter (Signed)
Spoke with Rose, scheduled for 2-26 at 11AM.  Pt aware to arrive at 10:45AM.  Pt aware to be NPO beginning at 9AM.  Left message on machine with instructions.

## 2012-09-29 ENCOUNTER — Ambulatory Visit (INDEPENDENT_AMBULATORY_CARE_PROVIDER_SITE_OTHER): Payer: Medicare Other | Admitting: Internal Medicine

## 2012-09-29 ENCOUNTER — Ambulatory Visit (INDEPENDENT_AMBULATORY_CARE_PROVIDER_SITE_OTHER)
Admission: RE | Admit: 2012-09-29 | Discharge: 2012-09-29 | Disposition: A | Discharge status: Home / Self Care | Payer: Medicaid Other | Source: Ambulatory Visit | Attending: Internal Medicine | Admitting: Internal Medicine

## 2012-09-29 ENCOUNTER — Encounter: Payer: Self-pay | Admitting: Internal Medicine

## 2012-09-29 VITALS — BP 165/82 | HR 85 | Ht 62.0 in | Wt 162.5 lb

## 2012-09-29 DIAGNOSIS — I480 Paroxysmal atrial fibrillation: Secondary | ICD-10-CM

## 2012-09-29 DIAGNOSIS — I4891 Unspecified atrial fibrillation: Secondary | ICD-10-CM

## 2012-09-29 DIAGNOSIS — C3410 Malignant neoplasm of upper lobe, unspecified bronchus or lung: Secondary | ICD-10-CM

## 2012-09-29 DIAGNOSIS — I495 Sick sinus syndrome: Secondary | ICD-10-CM

## 2012-09-29 DIAGNOSIS — R911 Solitary pulmonary nodule: Secondary | ICD-10-CM

## 2012-09-29 LAB — PACEMAKER DEVICE OBSERVATION

## 2012-09-29 MED ORDER — IOHEXOL 300 MG/ML  SOLN
80.0000 mL | Freq: Once | INTRAMUSCULAR | Status: AC | PRN
  Administered 2012-09-29: 80 mL via INTRAVENOUS

## 2012-09-29 MED ORDER — DILTIAZEM HCL ER COATED BEADS 240 MG PO CP24: 240.0000 mg | ORAL_CAPSULE | Freq: Every day | ORAL | Status: DC

## 2012-09-29 MED ORDER — RIVAROXABAN 20 MG PO TABS: 20.0000 mg | ORAL_TABLET | Freq: Every day | ORAL | Status: DC

## 2012-09-29 NOTE — Patient Instructions (Addendum)
Your physician recommends that you schedule a follow-up appointment as scheduled    Your physician has recommended you make the following change in your medication:  1) Stop Aspirin 2) Start Xarelto 20mg  daily    CT results reviewed with patient ---- 1)Full body PET scan 2) Your physician has recommended that you have a pulmonary function test. Pulmonary Function Tests are a group of tests that measure how well air moves in and out of your lungs.  Appointment on Thurs at 3pm with Dr Dorris Fetch [phone]

## 2012-09-29 NOTE — Progress Notes (Signed)
  Nancy Hodges is a 67 y.o. female who presents today for routine electrophysiology followup.  Since her recent pacemaker implant, the patient reports doing very well.  Today, she denies symptoms of chest pain, shortness of breath,  lower extremity edema, dizziness, presyncope, or syncope.  Her palpitations have improved.  The patient is otherwise without complaint today.   Past Medical History  Diagnosis Date  . Asthma   . History of sinusitis   . Allergic rhinitis   . History of kidney stones   . Paroxysmal atrial fibrillation     CHADS score: 0  . Atrial flutter   . 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 H/O hiatal hernia   . GERD (gastroesophageal reflux disease)   . Tachycardia-bradycardia syndrome   . Pulmonary nodule    Past Surgical History  Procedure Laterality Date  . Partial hysterectomy    . Pacemaker insertion  09/14/2012    Nanostim (SJM) leadless pacemaker (LEADLESS II STUDY PATIENT) implanted by Dr Johney Frame    Current Outpatient Prescriptions  Medication Sig Dispense Refill  . diltiazem (CARDIZEM CD) 240 MG 24 hr capsule Take 1 capsule (240 mg total) by mouth daily.  30 capsule  6  . fluticasone (FLONASE) 50 MCG/ACT nasal spray Place 2 sprays into the nose daily as needed.       . Multiple Vitamin (MULTIVITAMIN WITH MINERALS) TABS Take 1 tablet by mouth daily.      . Rivaroxaban (XARELTO) 20 MG TABS Take 1 tablet (20 mg total) by mouth daily.  30 tablet  11   No current facility-administered medications for this visit.    Physical Exam: Filed Vitals:   09/29/12 1115  BP: 165/82  Pulse: 85  Height: 5\' 2"  (1.575 m)  Weight: 162 lb 8 oz (73.71 kg)    GEN- The patient is well appearing, alert and oriented x 3 today.   Head- normocephalic, atraumatic Eyes-  Sclera clear, conjunctiva pink Ears- hearing intact Oropharynx- clear Lungs- Clear to ausculation bilaterally, normal work of breathing Heart- Regular rate and rhythm, no murmurs,  rubs or gallops, PMI not laterally displaced GI- soft, NT, ND, + BS Extremities- no clubbing, cyanosis, or edema  Pacemaker interrogation- reviewed in detail today,  See PACEART report  Assessment and Plan:  1. Tachycardia/ Bradycardia Normal pacemaker function See Pace Art report No changes today  2. afib Stop ASA  Start xarelto 20mg  daily Continue rate control long term  3. Pulmonary nodule- chest CT today reveals 1.8cm spiculated nodule Will refer to the Thoracic surgery clinic PET and PFTs ordered Pt is aware.

## 2012-09-30 ENCOUNTER — Telehealth: Payer: Self-pay | Admitting: Internal Medicine

## 2012-09-30 NOTE — Telephone Encounter (Signed)
New Problem:    Patient called in wanting to know her instructions for going to the hospital.  Please call back.

## 2012-09-30 NOTE — Telephone Encounter (Signed)
Spoke with patient, she is scheduled for both her PFT's and PET scan and aware of both times.  She also knows of her appointment with Dr Dorris Fetch on 10/08/12

## 2012-10-01 ENCOUNTER — Ambulatory Visit (HOSPITAL_COMMUNITY)
Admission: RE | Admit: 2012-10-01 | Discharge: 2012-10-01 | Disposition: A | Discharge status: Home / Self Care | Payer: Medicaid Other | Source: Ambulatory Visit | Attending: Internal Medicine | Admitting: Internal Medicine

## 2012-10-01 DIAGNOSIS — C3490 Malignant neoplasm of unspecified part of unspecified bronchus or lung: Secondary | ICD-10-CM | POA: Insufficient documentation

## 2012-10-01 DIAGNOSIS — R942 Abnormal results of pulmonary function studies: Secondary | ICD-10-CM | POA: Insufficient documentation

## 2012-10-01 DIAGNOSIS — R0609 Other forms of dyspnea: Secondary | ICD-10-CM | POA: Insufficient documentation

## 2012-10-01 DIAGNOSIS — R0989 Other specified symptoms and signs involving the circulatory and respiratory systems: Secondary | ICD-10-CM | POA: Insufficient documentation

## 2012-10-01 DIAGNOSIS — R062 Wheezing: Secondary | ICD-10-CM | POA: Insufficient documentation

## 2012-10-01 DIAGNOSIS — Z87891 Personal history of nicotine dependence: Secondary | ICD-10-CM | POA: Insufficient documentation

## 2012-10-01 LAB — PULMONARY FUNCTION TEST

## 2012-10-01 MED ORDER — ALBUTEROL SULFATE (5 MG/ML) 0.5% IN NEBU
2.5000 mg | INHALATION_SOLUTION | Freq: Once | RESPIRATORY_TRACT | Status: AC
  Administered 2012-10-01: 2.5 mg via RESPIRATORY_TRACT

## 2012-10-05 ENCOUNTER — Ambulatory Visit (HOSPITAL_COMMUNITY): Admission: RE | Admit: 2012-10-05 | Payer: Medicare Other | Source: Ambulatory Visit

## 2012-10-05 ENCOUNTER — Encounter: Payer: Medicare Other | Admitting: Thoracic Surgery (Cardiothoracic Vascular Surgery)

## 2012-10-05 ENCOUNTER — Telehealth: Payer: Self-pay | Admitting: Internal Medicine

## 2012-10-05 NOTE — Telephone Encounter (Signed)
Spoke with patient and let her know she is going to see Dr Dorris Fetch He is a Cardiovascular surgeon

## 2012-10-05 NOTE — Telephone Encounter (Signed)
New Prob    Needs clarification on physician she was referred to. Would like to speak to nurse.

## 2012-10-08 ENCOUNTER — Ambulatory Visit (HOSPITAL_COMMUNITY): Payer: Medicare Other

## 2012-10-12 ENCOUNTER — Encounter (HOSPITAL_COMMUNITY)
Admission: RE | Admit: 2012-10-12 | Discharge: 2012-10-12 | Disposition: A | Discharge status: Home / Self Care | Payer: Medicare Other | Source: Ambulatory Visit | Attending: Internal Medicine | Admitting: Internal Medicine

## 2012-10-12 ENCOUNTER — Encounter: Payer: Medicare Other | Admitting: Thoracic Surgery (Cardiothoracic Vascular Surgery)

## 2012-10-12 ENCOUNTER — Ambulatory Visit (INDEPENDENT_AMBULATORY_CARE_PROVIDER_SITE_OTHER): Payer: Medicare Other | Admitting: Thoracic Surgery (Cardiothoracic Vascular Surgery)

## 2012-10-12 ENCOUNTER — Encounter: Payer: Self-pay | Admitting: Thoracic Surgery (Cardiothoracic Vascular Surgery)

## 2012-10-12 VITALS — BP 150/79 | HR 79 | Resp 20 | Ht 62.0 in | Wt 162.0 lb

## 2012-10-12 DIAGNOSIS — K449 Diaphragmatic hernia without obstruction or gangrene: Secondary | ICD-10-CM | POA: Insufficient documentation

## 2012-10-12 DIAGNOSIS — R911 Solitary pulmonary nodule: Secondary | ICD-10-CM | POA: Insufficient documentation

## 2012-10-12 DIAGNOSIS — K7689 Other specified diseases of liver: Secondary | ICD-10-CM | POA: Insufficient documentation

## 2012-10-12 DIAGNOSIS — I2510 Atherosclerotic heart disease of native coronary artery without angina pectoris: Secondary | ICD-10-CM | POA: Insufficient documentation

## 2012-10-12 DIAGNOSIS — Z90710 Acquired absence of both cervix and uterus: Secondary | ICD-10-CM | POA: Insufficient documentation

## 2012-10-12 DIAGNOSIS — R918 Other nonspecific abnormal finding of lung field: Secondary | ICD-10-CM

## 2012-10-12 DIAGNOSIS — K8020 Calculus of gallbladder without cholecystitis without obstruction: Secondary | ICD-10-CM | POA: Insufficient documentation

## 2012-10-12 MED ORDER — FLUDEOXYGLUCOSE F - 18 (FDG) INJECTION
19.3000 | Freq: Once | INTRAVENOUS | Status: AC | PRN
  Administered 2012-10-12: 19.3 via INTRAVENOUS

## 2012-10-12 NOTE — Progress Notes (Signed)
PCP is Calla Kicks, MD Referring Provider is Hillis Range, MD  Chief Complaint  Patient presents with  . Lung Mass    Referral from Dr Johney Frame for surgical eval on lung mass, PET Scan and PFT's 10/12/12     HPI: Presents with chief complaint of a lung mass.  Nancy Hodges is a 67 year old woman with a past history of heavy tobacco abuse who recently was being treated for atrial fibrillation and tachybradycardia syndrome. During her evaluation for pacemaker placement she was found to have a right lung nodule on chest x-ray. She then had a CT of the chest which showed a 1.8 cm mass in the right upper lobe, there was no mediastinal or hilar adenopathy. Today she had a PET/CT which showed the lesion to be hypermetabolic. There is no evidence of regional or distant metastases.  She states that she's been having dizzy spells and palpitations prior to treatment of her tachybradycardia syndrome. Those have resolved since she had her pacemaker. She has occasional wheezing mostly in the spring with allergies. She's not had any exertional chest pain or shortness of breath. She's not had any unusual cough and denies hemoptysis. She lives alone and takes care of all her home needs.  Zubrod score is 0.  Past Medical History  Diagnosis Date  . Asthma   . History of sinusitis   . Allergic rhinitis   . History of kidney stones   . Paroxysmal atrial fibrillation     CHADS score: 0  . Atrial flutter   . 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 H/O hiatal hernia   . GERD (gastroesophageal reflux disease)   . Tachycardia-bradycardia syndrome   . Pulmonary nodule     Past Surgical History  Procedure Laterality Date  . Partial hysterectomy    . Pacemaker insertion  09/14/2012    Nanostim (SJM) leadless pacemaker (LEADLESS II STUDY PATIENT) implanted by Dr Johney Frame    Family History  Problem Relation Age of Onset  . Heart disease Mother   . Cancer Mother   . Diabetes Mother   .  Asthma Father   . Heart disease Father   . Cancer Brother   . Diabetes Sister   . Cancer Sister   . Diabetes Brother   . Heart attack Son     Social History History  Substance Use Topics  . Smoking status: Former Smoker -- 2.00 packs/day for 40 years    Types: Cigarettes    Quit date: 08/04/1981  . Smokeless tobacco: Not on file  . Alcohol Use: No    Current Outpatient Prescriptions  Medication Sig Dispense Refill  . diltiazem (CARDIZEM CD) 240 MG 24 hr capsule Take 1 capsule (240 mg total) by mouth daily.  30 capsule  6  . fluticasone (FLONASE) 50 MCG/ACT nasal spray Place 2 sprays into the nose daily as needed.       . Multiple Vitamin (MULTIVITAMIN WITH MINERALS) TABS Take 1 tablet by mouth daily.      . Rivaroxaban (XARELTO) 20 MG TABS Take 1 tablet (20 mg total) by mouth daily.  30 tablet  11   No current facility-administered medications for this visit.    Allergies  Allergen Reactions  . Penicillins   . Prednisone   . Septra (Sulfamethoxazole W-Trimethoprim)   . Vioxx (Rofecoxib)     Review of Systems  Constitutional: Negative for fever, chills, appetite change and unexpected weight change.  HENT: Positive for sneezing (allergies).   Eyes:  Negative for visual disturbance.  Respiratory: Positive for wheezing (with allergies, rarely uses inhaler). Negative for cough, chest tightness, shortness of breath and stridor.   Cardiovascular: Positive for palpitations. Negative for chest pain and leg swelling.  Gastrointestinal:       Reflux, hiatal hernia  Genitourinary: Negative.   Neurological: Positive for dizziness.  Hematological: Does not bruise/bleed easily.  All other systems reviewed and are negative.    BP 150/79  Pulse 79  Resp 20  Ht 5\' 2"  (1.575 m)  Wt 162 lb (73.483 kg)  BMI 29.62 kg/m2  SpO2 96% Physical Exam  Vitals reviewed. Constitutional: She is oriented to person, place, and time. She appears well-developed and well-nourished. No distress.   HENT:  Head: Normocephalic and atraumatic.  Eyes: EOM are normal. Pupils are equal, round, and reactive to light.  Neck: Neck supple. No thyromegaly present.  Cardiovascular: Normal rate, regular rhythm, normal heart sounds and intact distal pulses.  Exam reveals no friction rub.   No murmur heard. Pulmonary/Chest: Effort normal and breath sounds normal. She has no wheezes. She has no rales.  Abdominal: Soft. There is no tenderness.  Musculoskeletal: She exhibits no edema.  Lymphadenopathy:    She has no cervical adenopathy.  Neurological: She is alert and oriented to person, place, and time. No cranial nerve deficit.  Skin: Skin is warm and dry.  Psychiatric: She has a normal mood and affect.     Diagnostic Tests: CT CHEST WITH CONTRAST  Technique: Multidetector CT imaging of the chest was performed  following the standard protocol during bolus administration of  intravenous contrast.  Contrast: 80mL OMNIPAQUE IOHEXOL 300 MG/ML SOLN  Comparison: Chest radiograph on 09/15/2012  Findings: 1.8 cm spiculated nodule is seen in the posterior right  upper lobe corresponding to the nodular density seen on chest  radiograph. This is consistent with bronchogenic carcinoma. No  other suspicious pulmonary nodules or masses are identified.  No evidence of pleural or pericardial effusion. No evidence of  hilar or mediastinal lymphadenopathy. No adenopathy seen elsewhere  within the thorax.  A small hiatal hernia is noted. No evidence of chest wall mass or  suspicious bone lesions. Both adrenal glands are normal  appearance. Hepatic steatosis incidentally noted.  IMPRESSION:  1. 1.8 cm spiculated nodule in the posterior right upper lobe,  consistent with bronchogenic carcinoma Consultation with the [facility] [phone]) should be  considered.  2. No evidence of adenopathy or other signs of metastatic disease.  3. Incidentally noted small hiatal  hernia and hepatic steatosis.  Original Report Authenticated By: Myles Rosenthal, M.D.  PET/CT 10/12/2012  Reading pending Right upper lobe nodule is hypermetabolic No obvious regional or distant metastases.  Pulmonary function testing 10/01/12 FVC 2.36 (84% predicted) FEV1 1.72 (81% predicted) TLC 4.91 (103% predicted) DLCO 71%  Impression: 67 year old woman history of heavy tobacco abuse who was found to have a new right lung nodule. This is a 1.8 cm mass in the right upper lobe. It is hypermetabolic by PET. 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 It needs to be considered a lung cancer unless it can be proven otherwise. The only way to definitively prove otherwise would be to do an excisional biopsy. I don't think a needle biopsy would be helpful in this case, because of positive she would need surgery and if negative the index of suspicion is so high would still need a confirmatory wedge resection.  My recommendation to Nancy Hodges is to proceed  with a right VATS, wedge resection, possible right upper lobectomy if frozen section confirms that this is a lung cancer. Fortunately this does appear to be early stage (clinical stage IA). We will of course review the official report on the PET CT once it is available, but I do not see any evidence of metastatic disease.  I have discussed with the patient the general nature of the procedure, need for general anesthesia, and the incisions to be used. I discussed the expected hospital stay, overall recovery and short and long term outcomes. She understands the risks include, but are not limited to, death, stroke, MI, DVT/PE, bleeding, possible need for transfusion, infections, cardiac arrhythmias, prolonged air leaks, and other organ system dysfunction including respiratory, renal, or GI complications. She accepts these risks and wishes to proceed.  She is uncertain about timing because she lives alone and needs to arrange for assistance his  discharge. I recommended to her that we proceed as soon as she can make arrangements and that we may be able to assist her if assisted living type situation is needed.   Plan: Right VATS, wedge resection, possible right upper lobectomy based on intraoperative frozen section findings. Patient will call to schedule.

## 2012-10-13 ENCOUNTER — Other Ambulatory Visit: Payer: Self-pay | Admitting: *Deleted

## 2012-10-13 ENCOUNTER — Encounter (HOSPITAL_COMMUNITY): Payer: Self-pay | Admitting: Pharmacy Technician

## 2012-10-14 LAB — PACEMAKER DEVICE OBSERVATION
BRDY-0002RV: 40 {beats}/min
DEVICE MODEL PM: 1095
RV LEAD THRESHOLD: 1.25 V

## 2012-10-23 NOTE — Pre-Procedure Instructions (Signed)
Nancy Hodges  10/23/2012   Your procedure is scheduled on:  Wed, Mar 26 @ 8:30 AM  Report to [facility] at 6:30 AM.  Call this number if you have problems the morning of surgery: [phone]   Remember:   Do not eat food or drink liquids after midnight.   Take these medicines the morning of surgery with A SIP OF WATER: Cardizem(Diltiazem) and Flonase(Fluticasone)   Do not wear jewelry, make-up or nail polish.  Do not wear lotions, powders, or perfumes. You may wear deodorant.  Do not shave 48 hours prior to surgery.   Do not bring valuables to the hospital.  Contacts, dentures or bridgework may not be worn into surgery.  Leave suitcase in the car. After surgery it may be brought to your room.  For patients admitted to the hospital, checkout time is 11:00 AM the day of  discharge.   Patients discharged the day of surgery will not be allowed to drive  home.    Special Instructions: Shower using CHG 2 nights before surgery and the night before surgery.  If you shower the day of surgery use CHG.  Use special wash - you have one bottle of CHG for all showers.  You should use approximately 1/3 of the bottle for each shower.   Please read over the following fact sheets that you were given: Pain Booklet, Coughing and Deep Breathing, Blood Transfusion Information, MRSA Information and Surgical Site Infection Prevention

## 2012-10-25 ENCOUNTER — Encounter (HOSPITAL_COMMUNITY)
Admission: RE | Admit: 2012-10-25 | Discharge: 2012-10-25 | Disposition: A | Discharge status: Home / Self Care | Payer: Medicare Other | Source: Ambulatory Visit | Attending: Thoracic Surgery (Cardiothoracic Vascular Surgery) | Admitting: Thoracic Surgery (Cardiothoracic Vascular Surgery)

## 2012-10-25 ENCOUNTER — Encounter (HOSPITAL_COMMUNITY): Payer: Self-pay

## 2012-10-25 ENCOUNTER — Ambulatory Visit (INDEPENDENT_AMBULATORY_CARE_PROVIDER_SITE_OTHER): Payer: Medicare Other | Admitting: *Deleted

## 2012-10-25 VITALS — BP 127/74 | HR 70 | Temp 98.2°F | Resp 20 | Ht 62.0 in | Wt 165.2 lb

## 2012-10-25 DIAGNOSIS — Z950 Presence of cardiac pacemaker: Secondary | ICD-10-CM

## 2012-10-25 DIAGNOSIS — R0989 Other specified symptoms and signs involving the circulatory and respiratory systems: Secondary | ICD-10-CM

## 2012-10-25 DIAGNOSIS — I495 Sick sinus syndrome: Secondary | ICD-10-CM

## 2012-10-25 DIAGNOSIS — R918 Other nonspecific abnormal finding of lung field: Secondary | ICD-10-CM

## 2012-10-25 HISTORY — DX: Anxiety disorder, unspecified: F41.9

## 2012-10-25 LAB — COMPREHENSIVE METABOLIC PANEL
Albumin: 4 g/dL (ref 3.5–5.2)
Alkaline Phosphatase: 103 U/L (ref 39–117)
BUN: 10 mg/dL (ref 6–23)
CO2: 23 mEq/L (ref 19–32)
Chloride: 101 mEq/L (ref 96–112)
Creatinine, Ser: 0.56 mg/dL (ref 0.50–1.10)
GFR calc non Af Amer: >90 mL/min (ref >90)
Potassium: 4 mEq/L (ref 3.5–5.1)
Total Bilirubin: 0.5 mg/dL (ref 0.3–1.2)

## 2012-10-25 LAB — BLOOD GAS, ARTERIAL
Acid-Base Excess: 1.6 mmol/L (ref 0.0–2.0)
Bicarbonate: 25.4 mEq/L — ABNORMAL HIGH (ref 20.0–24.0)
TCO2: 26.6 mmol/L (ref 0–100)
pCO2 arterial: 38.8 mmHg (ref 35.0–45.0)
pH, Arterial: 7.433 (ref 7.350–7.450)
pO2, Arterial: 108 mmHg — ABNORMAL HIGH (ref 80.0–100.0)

## 2012-10-25 LAB — CBC
HCT: 39.9 % (ref 36.0–46.0)
Hemoglobin: 14.3 g/dL (ref 12.0–15.0)
MCH: 31.6 pg (ref 26.0–34.0)
MCHC: 35.8 g/dL (ref 30.0–36.0)
MCV: 88.3 fL (ref 78.0–100.0)
Platelets: 292 10*3/uL (ref 150–400)
RBC: 4.52 MIL/uL (ref 3.87–5.11)
RDW: 12.4 % (ref 11.5–15.5)
WBC: 9 10*3/uL (ref 4.0–10.5)

## 2012-10-25 LAB — URINALYSIS, ROUTINE W REFLEX MICROSCOPIC
Bilirubin Urine: NEGATIVE
Glucose, UA: NEGATIVE mg/dL
Leukocytes, UA: NEGATIVE
Nitrite: NEGATIVE
Specific Gravity, Urine: 1.011 (ref 1.005–1.030)
pH: 7.5 (ref 5.0–8.0)

## 2012-10-25 LAB — TYPE AND SCREEN
ABO/RH(D): O POS
Antibody Screen: NEGATIVE

## 2012-10-25 LAB — ABO/RH: ABO/RH(D): O POS

## 2012-10-25 LAB — SURGICAL PCR SCREEN
MRSA, PCR: NEGATIVE
Staphylococcus aureus: NEGATIVE

## 2012-10-25 LAB — PROTIME-INR
INR: 1.04 (ref 0.00–1.49)
Prothrombin Time: 13.5 s (ref 11.6–15.2)

## 2012-10-25 NOTE — Progress Notes (Signed)
Peri-operative Implanted Device orders faxed to [facility] device clinic.

## 2012-10-26 MED ORDER — VANCOMYCIN HCL IN DEXTROSE 1-5 GM/200ML-% IV SOLN
1000.0000 mg | INTRAVENOUS | Status: AC
  Administered 2012-10-27: 1000 mg via INTRAVENOUS
  Filled 2012-10-26: qty 200

## 2012-10-27 ENCOUNTER — Encounter (HOSPITAL_COMMUNITY): Payer: Self-pay | Admitting: Anesthesiology

## 2012-10-27 ENCOUNTER — Inpatient Hospital Stay (HOSPITAL_COMMUNITY)
Admission: RE | Admit: 2012-10-27 | Discharge: 2012-11-05 | DRG: 164 | Disposition: A | Discharge status: Home Health | Payer: Medicare Other | Source: Ambulatory Visit | Attending: Thoracic Surgery (Cardiothoracic Vascular Surgery) | Admitting: Thoracic Surgery (Cardiothoracic Vascular Surgery)

## 2012-10-27 ENCOUNTER — Encounter (HOSPITAL_COMMUNITY)
Admission: RE | Disposition: A | Discharge status: Home Health | Payer: Self-pay | Source: Ambulatory Visit | Attending: Thoracic Surgery (Cardiothoracic Vascular Surgery)

## 2012-10-27 ENCOUNTER — Inpatient Hospital Stay (HOSPITAL_COMMUNITY): Payer: Medicare Other

## 2012-10-27 ENCOUNTER — Inpatient Hospital Stay (HOSPITAL_COMMUNITY): Payer: Medicare Other | Admitting: Anesthesiology

## 2012-10-27 DIAGNOSIS — Z87891 Personal history of nicotine dependence: Secondary | ICD-10-CM

## 2012-10-27 DIAGNOSIS — E875 Hyperkalemia: Secondary | ICD-10-CM | POA: Diagnosis not present

## 2012-10-27 DIAGNOSIS — I495 Sick sinus syndrome: Secondary | ICD-10-CM | POA: Diagnosis present

## 2012-10-27 DIAGNOSIS — C3410 Malignant neoplasm of upper lobe, unspecified bronchus or lung: Principal | ICD-10-CM | POA: Diagnosis present

## 2012-10-27 DIAGNOSIS — J309 Allergic rhinitis, unspecified: Secondary | ICD-10-CM | POA: Diagnosis present

## 2012-10-27 DIAGNOSIS — Z01812 Encounter for preprocedural laboratory examination: Secondary | ICD-10-CM

## 2012-10-27 DIAGNOSIS — K219 Gastro-esophageal reflux disease without esophagitis: Secondary | ICD-10-CM | POA: Diagnosis present

## 2012-10-27 DIAGNOSIS — E871 Hypo-osmolality and hyponatremia: Secondary | ICD-10-CM | POA: Diagnosis not present

## 2012-10-27 DIAGNOSIS — Z950 Presence of cardiac pacemaker: Secondary | ICD-10-CM

## 2012-10-27 DIAGNOSIS — R338 Other retention of urine: Secondary | ICD-10-CM | POA: Diagnosis not present

## 2012-10-27 DIAGNOSIS — J9819 Other pulmonary collapse: Secondary | ICD-10-CM | POA: Diagnosis not present

## 2012-10-27 DIAGNOSIS — J95812 Postprocedural air leak: Secondary | ICD-10-CM | POA: Diagnosis not present

## 2012-10-27 DIAGNOSIS — Z01818 Encounter for other preprocedural examination: Secondary | ICD-10-CM

## 2012-10-27 DIAGNOSIS — R5381 Other malaise: Secondary | ICD-10-CM | POA: Diagnosis present

## 2012-10-27 DIAGNOSIS — R918 Other nonspecific abnormal finding of lung field: Secondary | ICD-10-CM

## 2012-10-27 DIAGNOSIS — I4891 Unspecified atrial fibrillation: Secondary | ICD-10-CM | POA: Diagnosis present

## 2012-10-27 DIAGNOSIS — I480 Paroxysmal atrial fibrillation: Secondary | ICD-10-CM

## 2012-10-27 DIAGNOSIS — F411 Generalized anxiety disorder: Secondary | ICD-10-CM | POA: Diagnosis present

## 2012-10-27 DIAGNOSIS — Y849 Medical procedure, unspecified as the cause of abnormal reaction of the patient, or of later complication, without mention of misadventure at the time of the procedure: Secondary | ICD-10-CM | POA: Diagnosis present

## 2012-10-27 HISTORY — PX: LOBECTOMY: SHX5089

## 2012-10-27 HISTORY — PX: VIDEO ASSISTED THORACOSCOPY (VATS)/WEDGE RESECTION: SHX6174

## 2012-10-27 SURGERY — VIDEO ASSISTED THORACOSCOPY (VATS)/WEDGE RESECTION
Anesthesia: General | Site: Chest | Laterality: Right | Wound class: Clean Contaminated

## 2012-10-27 MED ORDER — ONDANSETRON HCL 4 MG/2ML IJ SOLN
4.0000 mg | Freq: Four times a day (QID) | INTRAMUSCULAR | Status: DC | PRN
  Filled 2012-10-27 (×2): qty 2

## 2012-10-27 MED ORDER — ONDANSETRON HCL 4 MG/2ML IJ SOLN
INTRAMUSCULAR | Status: DC | PRN
  Administered 2012-10-27: 4 mg via INTRAVENOUS

## 2012-10-27 MED ORDER — NALOXONE HCL 0.4 MG/ML IJ SOLN: 0.4000 mg | INTRAMUSCULAR | Status: DC | PRN

## 2012-10-27 MED ORDER — HYDROMORPHONE HCL PF 1 MG/ML IJ SOLN
INTRAMUSCULAR | Status: AC
Start: 2012-10-27 — End: 2012-10-27
  Administered 2012-10-27: 0.5 mg
  Filled 2012-10-27: qty 1

## 2012-10-27 MED ORDER — KCL IN DEXTROSE-NACL 20-5-0.45 MEQ/L-%-% IV SOLN
INTRAVENOUS | Status: AC
  Filled 2012-10-27: qty 1000

## 2012-10-27 MED ORDER — HYDROMORPHONE HCL PF 1 MG/ML IJ SOLN
0.2500 mg | INTRAMUSCULAR | Status: DC | PRN
  Administered 2012-10-27 (×3): 0.5 mg via INTRAVENOUS

## 2012-10-27 MED ORDER — BISACODYL 5 MG PO TBEC
10.0000 mg | DELAYED_RELEASE_TABLET | Freq: Every day | ORAL | Status: DC
  Administered 2012-10-28 – 2012-11-05 (×9): 10 mg via ORAL
  Filled 2012-10-27 (×9): qty 2

## 2012-10-27 MED ORDER — ALBUTEROL SULFATE HFA 108 (90 BASE) MCG/ACT IN AERS
4.0000 | INHALATION_SPRAY | Freq: Four times a day (QID) | RESPIRATORY_TRACT | Status: DC
  Filled 2012-10-27: qty 6.7

## 2012-10-27 MED ORDER — FENTANYL 10 MCG/ML IV SOLN
INTRAVENOUS | Status: DC
  Administered 2012-10-27: via INTRAVENOUS
  Administered 2012-10-27 (×2): 150 ug via INTRAVENOUS
  Administered 2012-10-27: 14:00:00 via INTRAVENOUS
  Administered 2012-10-28: 30 ug via INTRAVENOUS
  Administered 2012-10-28: 210 ug via INTRAVENOUS
  Administered 2012-10-28: 220 ug via INTRAVENOUS
  Administered 2012-10-28: 08:00:00 via INTRAVENOUS
  Administered 2012-10-28: 100 ug via INTRAVENOUS
  Administered 2012-10-28: 130 ug via INTRAVENOUS
  Administered 2012-10-29: 40 ug via INTRAVENOUS
  Administered 2012-10-29: 20 ug via INTRAVENOUS
  Administered 2012-10-29: 40 ug via INTRAVENOUS
  Filled 2012-10-27 (×3): qty 50

## 2012-10-27 MED ORDER — ACETAMINOPHEN 10 MG/ML IV SOLN
INTRAVENOUS | Status: AC
  Filled 2012-10-27: qty 100

## 2012-10-27 MED ORDER — BUPIVACAINE ON-Q PAIN PUMP (FOR ORDER SET NO CHG)
INJECTION | Status: DC
  Filled 2012-10-27: qty 1

## 2012-10-27 MED ORDER — SODIUM CHLORIDE 0.9 % IJ SOLN: 9.0000 mL | INTRAMUSCULAR | Status: DC | PRN

## 2012-10-27 MED ORDER — LACTATED RINGERS IV SOLN
INTRAVENOUS | Status: DC | PRN
  Administered 2012-10-27: 08:00:00 via INTRAVENOUS

## 2012-10-27 MED ORDER — PHENYLEPHRINE HCL 10 MG/ML IJ SOLN
INTRAMUSCULAR | Status: DC | PRN
  Administered 2012-10-27: 40 ug via INTRAVENOUS
  Administered 2012-10-27: 20 ug via INTRAVENOUS

## 2012-10-27 MED ORDER — NEOSTIGMINE METHYLSULFATE 1 MG/ML IJ SOLN
INTRAMUSCULAR | Status: DC | PRN
  Administered 2012-10-27: 3 mg via INTRAVENOUS

## 2012-10-27 MED ORDER — BUPIVACAINE 0.5 % ON-Q PUMP SINGLE CATH 400 ML
INJECTION | Status: DC | PRN
  Administered 2012-10-27: 400 mL

## 2012-10-27 MED ORDER — ONDANSETRON HCL 4 MG/2ML IJ SOLN: 4.0000 mg | Freq: Four times a day (QID) | INTRAMUSCULAR | Status: DC | PRN

## 2012-10-27 MED ORDER — OXYCODONE HCL 5 MG/5ML PO SOLN: 5.0000 mg | Freq: Once | ORAL | Status: DC | PRN

## 2012-10-27 MED ORDER — ONDANSETRON HCL 4 MG/2ML IJ SOLN
4.0000 mg | Freq: Four times a day (QID) | INTRAMUSCULAR | Status: DC | PRN
  Administered 2012-10-27 – 2012-10-28 (×2): 4 mg via INTRAVENOUS

## 2012-10-27 MED ORDER — BUPIVACAINE 0.5 % ON-Q PUMP SINGLE CATH 400 ML
400.0000 mL | INJECTION | Status: DC
  Filled 2012-10-27: qty 400

## 2012-10-27 MED ORDER — ACETAMINOPHEN 10 MG/ML IV SOLN
1000.0000 mg | Freq: Four times a day (QID) | INTRAVENOUS | Status: AC
  Administered 2012-10-27 – 2012-10-28 (×4): 1000 mg via INTRAVENOUS
  Filled 2012-10-27 (×3): qty 100

## 2012-10-27 MED ORDER — GLYCOPYRROLATE 0.2 MG/ML IJ SOLN
INTRAMUSCULAR | Status: DC | PRN
  Administered 2012-10-27: 0.4 mg via INTRAVENOUS

## 2012-10-27 MED ORDER — LORATADINE 10 MG PO TABS
10.0000 mg | ORAL_TABLET | Freq: Every day | ORAL | Status: DC
  Administered 2012-10-28 – 2012-11-05 (×9): 10 mg via ORAL
  Filled 2012-10-27 (×10): qty 1

## 2012-10-27 MED ORDER — LIDOCAINE HCL (CARDIAC) 20 MG/ML IV SOLN
INTRAVENOUS | Status: DC | PRN
  Administered 2012-10-27: 40 mg via INTRAVENOUS

## 2012-10-27 MED ORDER — POTASSIUM CHLORIDE 10 MEQ/50ML IV SOLN
10.0000 meq | Freq: Every day | INTRAVENOUS | Status: DC | PRN
  Filled 2012-10-27: qty 200

## 2012-10-27 MED ORDER — DIPHENHYDRAMINE HCL 12.5 MG/5ML PO ELIX
12.5000 mg | ORAL_SOLUTION | Freq: Four times a day (QID) | ORAL | Status: DC | PRN
  Filled 2012-10-27: qty 5

## 2012-10-27 MED ORDER — OXYCODONE HCL 5 MG PO TABS: 5.0000 mg | ORAL_TABLET | Freq: Once | ORAL | Status: DC | PRN

## 2012-10-27 MED ORDER — DILTIAZEM HCL ER COATED BEADS 240 MG PO CP24
240.0000 mg | ORAL_CAPSULE | Freq: Every day | ORAL | Status: DC
  Administered 2012-10-28 – 2012-11-05 (×9): 240 mg via ORAL
  Filled 2012-10-27 (×10): qty 1

## 2012-10-27 MED ORDER — FENTANYL CITRATE 0.05 MG/ML IJ SOLN
INTRAMUSCULAR | Status: DC | PRN
  Administered 2012-10-27: 50 ug via INTRAVENOUS
  Administered 2012-10-27: 100 ug via INTRAVENOUS
  Administered 2012-10-27 (×2): 50 ug via INTRAVENOUS

## 2012-10-27 MED ORDER — HEMOSTATIC AGENTS (NO CHARGE) OPTIME
TOPICAL | Status: DC | PRN
  Administered 2012-10-27: 1 via TOPICAL

## 2012-10-27 MED ORDER — KCL IN DEXTROSE-NACL 20-5-0.45 MEQ/L-%-% IV SOLN
INTRAVENOUS | Status: DC
  Administered 2012-10-27 – 2012-10-28 (×2): via INTRAVENOUS
  Filled 2012-10-27 (×6): qty 1000

## 2012-10-27 MED ORDER — HYDROMORPHONE HCL PF 1 MG/ML IJ SOLN
INTRAMUSCULAR | Status: AC
  Filled 2012-10-27: qty 1

## 2012-10-27 MED ORDER — DIPHENHYDRAMINE HCL 50 MG/ML IJ SOLN: 12.5000 mg | Freq: Four times a day (QID) | INTRAMUSCULAR | Status: DC | PRN

## 2012-10-27 MED ORDER — ROCURONIUM BROMIDE 100 MG/10ML IV SOLN
INTRAVENOUS | Status: DC | PRN
  Administered 2012-10-27: 50 mg via INTRAVENOUS
  Administered 2012-10-27: 20 mg via INTRAVENOUS
  Administered 2012-10-27: 5 mg via INTRAVENOUS

## 2012-10-27 MED ORDER — SENNOSIDES-DOCUSATE SODIUM 8.6-50 MG PO TABS
1.0000 | ORAL_TABLET | Freq: Every evening | ORAL | Status: DC | PRN
  Filled 2012-10-27: qty 1

## 2012-10-27 MED ORDER — VANCOMYCIN HCL IN DEXTROSE 1-5 GM/200ML-% IV SOLN
1000.0000 mg | Freq: Two times a day (BID) | INTRAVENOUS | Status: AC
  Administered 2012-10-27: 1000 mg via INTRAVENOUS
  Filled 2012-10-27: qty 200

## 2012-10-27 MED ORDER — OXYCODONE HCL 5 MG PO TABS: 5.0000 mg | ORAL_TABLET | ORAL | Status: AC | PRN

## 2012-10-27 MED ORDER — OXYCODONE-ACETAMINOPHEN 5-325 MG PO TABS
1.0000 | ORAL_TABLET | ORAL | Status: DC | PRN
  Administered 2012-10-28 – 2012-10-29 (×2): 1 via ORAL
  Filled 2012-10-27 (×3): qty 1

## 2012-10-27 MED ORDER — PROPOFOL 10 MG/ML IV BOLUS
INTRAVENOUS | Status: DC | PRN
  Administered 2012-10-27: 130 mg via INTRAVENOUS

## 2012-10-27 MED ORDER — MIDAZOLAM HCL 5 MG/5ML IJ SOLN
INTRAMUSCULAR | Status: DC | PRN
  Administered 2012-10-27 (×2): 1 mg via INTRAVENOUS

## 2012-10-27 MED ORDER — 0.9 % SODIUM CHLORIDE (POUR BTL) OPTIME
TOPICAL | Status: DC | PRN
  Administered 2012-10-27: 3000 mL

## 2012-10-27 MED ORDER — TRAMADOL HCL 50 MG PO TABS
50.0000 mg | ORAL_TABLET | Freq: Four times a day (QID) | ORAL | Status: DC | PRN
  Administered 2012-10-28 (×2): 100 mg via ORAL
  Filled 2012-10-27 (×2): qty 2

## 2012-10-27 MED ORDER — ALBUTEROL SULFATE HFA 108 (90 BASE) MCG/ACT IN AERS
4.0000 | INHALATION_SPRAY | Freq: Four times a day (QID) | RESPIRATORY_TRACT | Status: AC
  Administered 2012-10-27 – 2012-10-29 (×6): 4 via RESPIRATORY_TRACT
  Filled 2012-10-27: qty 6.7

## 2012-10-27 SURGICAL SUPPLY — 79 items
BENZOIN TINCTURE PRP APPL 2/3 (GAUZE/BANDAGES/DRESSINGS) IMPLANT
CANISTER SUCTION 2500CC (MISCELLANEOUS) ×4 IMPLANT
CATH KIT ON Q 5IN SLV (PAIN MANAGEMENT) ×2 IMPLANT
CATH THORACIC 28FR (CATHETERS) ×2 IMPLANT
CATH THORACIC 36FR (CATHETERS) IMPLANT
CATH THORACIC 36FR RT ANG (CATHETERS) ×2 IMPLANT
CLIP TI MEDIUM 6 (CLIP) ×4 IMPLANT
CLOTH BEACON ORANGE TIMEOUT ST (SAFETY) ×2 IMPLANT
CONT SPEC 4OZ CLIKSEAL STRL BL (MISCELLANEOUS) ×6 IMPLANT
DERMABOND ADVANCED (GAUZE/BANDAGES/DRESSINGS) ×1
DERMABOND ADVANCED .7 DNX12 (GAUZE/BANDAGES/DRESSINGS) ×1 IMPLANT
DRAIN CHANNEL 28F RND 3/8 FF (WOUND CARE) IMPLANT
DRAIN CHANNEL 32F RND 10.7 FF (WOUND CARE) IMPLANT
DRAPE LAPAROSCOPIC ABDOMINAL (DRAPES) ×2 IMPLANT
DRAPE WARM FLUID 44X44 (DRAPE) ×2 IMPLANT
DRSG TEGADERM 4X4.75 (GAUZE/BANDAGES/DRESSINGS) ×2 IMPLANT
ELECT BLADE 6.5 EXT (BLADE) ×4 IMPLANT
ELECT REM PT RETURN 9FT ADLT (ELECTROSURGICAL) ×2
ELECTRODE REM PT RTRN 9FT ADLT (ELECTROSURGICAL) ×1 IMPLANT
GLOVE BIOGEL M STER SZ 6 (GLOVE) ×2 IMPLANT
GLOVE BIOGEL PI IND STRL 6.5 (GLOVE) ×3 IMPLANT
GLOVE BIOGEL PI INDICATOR 6.5 (GLOVE) ×3
GLOVE ECLIPSE 6.5 STRL STRAW (GLOVE) ×2 IMPLANT
GLOVE EUDERMIC 7 POWDERFREE (GLOVE) ×4 IMPLANT
GOWN PREVENTION PLUS XLARGE (GOWN DISPOSABLE) ×4 IMPLANT
GOWN STRL NON-REIN LRG LVL3 (GOWN DISPOSABLE) ×4 IMPLANT
HANDLE STAPLE ENDO GIA SHORT (STAPLE) ×1
HEMOSTAT SURGICEL 2X14 (HEMOSTASIS) ×2 IMPLANT
KIT BASIN OR (CUSTOM PROCEDURE TRAY) ×2 IMPLANT
KIT ROOM TURNOVER OR (KITS) ×2 IMPLANT
KIT SUCTION CATH 14FR (SUCTIONS) ×2 IMPLANT
NS IRRIG 1000ML POUR BTL (IV SOLUTION) ×6 IMPLANT
PACK CHEST (CUSTOM PROCEDURE TRAY) ×2 IMPLANT
PAD ARMBOARD 7.5X6 YLW CONV (MISCELLANEOUS) ×6 IMPLANT
PENCIL BUTTON HOLSTER BLD 10FT (ELECTRODE) ×6 IMPLANT
POUCH ENDO CATCH II 15MM (MISCELLANEOUS) ×2 IMPLANT
POUCH SPECIMEN RETRIEVAL 10MM (ENDOMECHANICALS) ×2 IMPLANT
RELOAD EGIA 45 MED/THCK PURPLE (STAPLE) ×4 IMPLANT
RELOAD EGIA 45 TAN VASC (STAPLE) ×8 IMPLANT
RELOAD EGIA 60 MED/THCK PURPLE (STAPLE) ×14 IMPLANT
RELOAD EGIA TRIS TAN 45 CVD (STAPLE) ×4 IMPLANT
RELOAD TRI 2.0 60 XTHK VAS SUL (STAPLE) ×2 IMPLANT
SEALANT PROGEL (MISCELLANEOUS) ×2 IMPLANT
SEALANT SURG COSEAL 4ML (VASCULAR PRODUCTS) IMPLANT
SEALANT SURG COSEAL 8ML (VASCULAR PRODUCTS) IMPLANT
SOLUTION ANTI FOG 6CC (MISCELLANEOUS) ×2 IMPLANT
SPECIMEN JAR MEDIUM (MISCELLANEOUS) ×2 IMPLANT
SPONGE GAUZE 4X4 12PLY (GAUZE/BANDAGES/DRESSINGS) ×4 IMPLANT
SPONGE INTESTINAL PEANUT (DISPOSABLE) ×2 IMPLANT
STAPLER ENDO GIA 12MM SHORT (STAPLE) ×1 IMPLANT
SUT PROLENE 4 0 RB 1 (SUTURE)
SUT PROLENE 4-0 RB1 .5 CRCL 36 (SUTURE) IMPLANT
SUT PROLENE 5 0 C 1 36 (SUTURE) ×4 IMPLANT
SUT SILK  1 MH (SUTURE) ×1
SUT SILK 1 MH (SUTURE) ×1 IMPLANT
SUT SILK 2 0SH CR/8 30 (SUTURE) IMPLANT
SUT SILK 3 0SH CR/8 30 (SUTURE) ×2 IMPLANT
SUT VIC AB 0 CTX 27 (SUTURE) IMPLANT
SUT VIC AB 1 CTX 27 (SUTURE) ×2 IMPLANT
SUT VIC AB 2-0 CT1 27 (SUTURE)
SUT VIC AB 2-0 CT1 TAPERPNT 27 (SUTURE) IMPLANT
SUT VIC AB 2-0 CTX 36 (SUTURE) ×2 IMPLANT
SUT VIC AB 3-0 MH 27 (SUTURE) ×4 IMPLANT
SUT VIC AB 3-0 SH 27 (SUTURE) ×2
SUT VIC AB 3-0 SH 27X BRD (SUTURE) ×2 IMPLANT
SUT VIC AB 3-0 X1 27 (SUTURE) ×2 IMPLANT
SUT VICRYL 0 UR6 27IN ABS (SUTURE) ×4 IMPLANT
SUT VICRYL 2 TP 1 (SUTURE) ×2 IMPLANT
SWAB COLLECTION DEVICE MRSA (MISCELLANEOUS) IMPLANT
SYSTEM SAHARA CHEST DRAIN ATS (WOUND CARE) ×2 IMPLANT
TAPE CLOTH SURG 4X10 WHT LF (GAUZE/BANDAGES/DRESSINGS) ×4 IMPLANT
TIP APPLICATOR SPRAY EXTEND 16 (VASCULAR PRODUCTS) IMPLANT
TOWEL OR 17X24 6PK STRL BLUE (TOWEL DISPOSABLE) ×2 IMPLANT
TOWEL OR 17X26 10 PK STRL BLUE (TOWEL DISPOSABLE) ×4 IMPLANT
TRAP SPECIMEN MUCOUS 40CC (MISCELLANEOUS) IMPLANT
TRAY FOLEY CATH 14FRSI W/METER (CATHETERS) ×2 IMPLANT
TUBE ANAEROBIC SPECIMEN COL (MISCELLANEOUS) IMPLANT
TUNNELER SHEATH ON-Q 16GX12 DP (PAIN MANAGEMENT) ×2 IMPLANT
WATER STERILE IRR 1000ML POUR (IV SOLUTION) ×4 IMPLANT

## 2012-10-27 NOTE — Brief Op Note (Addendum)
10/27/2012  12:27 PM  PATIENT:  Nancy Hodges  67 y.o. female  PRE-OPERATIVE DIAGNOSIS:  RIGHT UPPER LOBE MASS  POST-OPERATIVE DIAGNOSIS: NON-SMALL CELL CARCINOMA, RIGHT UPPER LOBE  PROCEDURE:   RIGHT VIDEO ASSISTED THORACOSCOPY, RIGHT UPPER LOBECTOMY, MEDIASTINAL LYMPH NODE DISSECTION, ON-Q CATHETER PLACEMENT  SURGEON:  Surgeon(s): Loreli Slot, MD  ASSISTANT: Coral Ceo, PA-C  ANESTHESIA:   general  SPECIMEN:  Right upper lobe, lymph nodes  DISPOSITION OF SPECIMEN:  Pathology  DRAINS: 28 Fr CT x 1, 36 Fr CT x 1  PATIENT CONDITION:  PACU - hemodynamically stable.  Frozen- non-small cell, likely adenocarcinoma. Clinical stage IA

## 2012-10-27 NOTE — Progress Notes (Signed)
Patient received to room 3309 from PACU in her bed already. Patient has been settled and oriented to unit, dghtr. At bedside.

## 2012-10-27 NOTE — Transfer of Care (Signed)
Immediate Anesthesia Transfer of Care Note  Patient: Nancy Hodges  Procedure(s) Performed: Procedure(s) with comments: VIDEO ASSISTED THORACOSCOPY (VATS),RGHT UPPER LOBE LUNG WEDGE RESECTION (Right) LOBECTOMY (Right) -  RIGHT UPPER LOBECTOMY & Node Dissection  Patient Location: PACU  Anesthesia Type:General  Level of Consciousness: awake, alert  and oriented  Airway & Oxygen Therapy: Patient Spontanous Breathing and Patient connected to nasal cannula oxygen  Post-op Assessment: Report given to PACU RN and Post -op Vital signs reviewed and stable  Post vital signs: Reviewed and stable  Complications: No apparent anesthesia complications

## 2012-10-27 NOTE — Preoperative (Signed)
Beta Blockers   Reason not to administer Beta Blockers:Not Applicable 

## 2012-10-27 NOTE — Progress Notes (Signed)
Utilization review completed.  

## 2012-10-27 NOTE — Progress Notes (Signed)
Giving lunch relief  

## 2012-10-27 NOTE — H&P (View-Only) (Signed)
PCP is Calla Kicks, MD Referring Provider is Hillis Range, MD  Chief Complaint  Patient presents with  . Lung Mass    Referral from Dr Johney Frame for surgical eval on lung mass, PET Scan and PFT's 10/12/12     HPI: Presents with chief complaint of a lung mass.  Nancy Hodges is a 67 year old woman with a past history of heavy tobacco abuse who recently was being treated for atrial fibrillation and tachybradycardia syndrome. During her evaluation for pacemaker placement she was found to have a right lung nodule on chest x-ray. She then had a CT of the chest which showed a 1.8 cm mass in the right upper lobe, there was no mediastinal or hilar adenopathy. Today she had a PET/CT which showed the lesion to be hypermetabolic. There is no evidence of regional or distant metastases.  She states that she's been having dizzy spells and palpitations prior to treatment of her tachybradycardia syndrome. Those have resolved since she had her pacemaker. She has occasional wheezing mostly in the spring with allergies. She's not had any exertional chest pain or shortness of breath. She's not had any unusual cough and denies hemoptysis. She lives alone and takes care of all her home needs.  Zubrod score is 0.  Past Medical History  Diagnosis Date  . Asthma   . History of sinusitis   . Allergic rhinitis   . History of kidney stones   . Paroxysmal atrial fibrillation     CHADS score: 0  . Atrial flutter   . 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 H/O hiatal hernia   . GERD (gastroesophageal reflux disease)   . Tachycardia-bradycardia syndrome   . Pulmonary nodule     Past Surgical History  Procedure Laterality Date  . Partial hysterectomy    . Pacemaker insertion  09/14/2012    Nanostim (SJM) leadless pacemaker (LEADLESS II STUDY PATIENT) implanted by Dr Johney Frame    Family History  Problem Relation Age of Onset  . Heart disease Mother   . Cancer Mother   . Diabetes Mother   .  Asthma Father   . Heart disease Father   . Cancer Brother   . Diabetes Sister   . Cancer Sister   . Diabetes Brother   . Heart attack Son     Social History History  Substance Use Topics  . Smoking status: Former Smoker -- 2.00 packs/day for 40 years    Types: Cigarettes    Quit date: 08/04/1981  . Smokeless tobacco: Not on file  . Alcohol Use: No    Current Outpatient Prescriptions  Medication Sig Dispense Refill  . diltiazem (CARDIZEM CD) 240 MG 24 hr capsule Take 1 capsule (240 mg total) by mouth daily.  30 capsule  6  . fluticasone (FLONASE) 50 MCG/ACT nasal spray Place 2 sprays into the nose daily as needed.       . Multiple Vitamin (MULTIVITAMIN WITH MINERALS) TABS Take 1 tablet by mouth daily.      . Rivaroxaban (XARELTO) 20 MG TABS Take 1 tablet (20 mg total) by mouth daily.  30 tablet  11   No current facility-administered medications for this visit.    Allergies  Allergen Reactions  . Penicillins   . Prednisone   . Septra (Sulfamethoxazole W-Trimethoprim)   . Vioxx (Rofecoxib)     Review of Systems  Constitutional: Negative for fever, chills, appetite change and unexpected weight change.  HENT: Positive for sneezing (allergies).   Eyes:  Negative for visual disturbance.  Respiratory: Positive for wheezing (with allergies, rarely uses inhaler). Negative for cough, chest tightness, shortness of breath and stridor.   Cardiovascular: Positive for palpitations. Negative for chest pain and leg swelling.  Gastrointestinal:       Reflux, hiatal hernia  Genitourinary: Negative.   Neurological: Positive for dizziness.  Hematological: Does not bruise/bleed easily.  All other systems reviewed and are negative.    BP 150/79  Pulse 79  Resp 20  Ht 5\' 2"  (1.575 m)  Wt 162 lb (73.483 kg)  BMI 29.62 kg/m2  SpO2 96% Physical Exam  Vitals reviewed. Constitutional: She is oriented to person, place, and time. She appears well-developed and well-nourished. No distress.   HENT:  Head: Normocephalic and atraumatic.  Eyes: EOM are normal. Pupils are equal, round, and reactive to light.  Neck: Neck supple. No thyromegaly present.  Cardiovascular: Normal rate, regular rhythm, normal heart sounds and intact distal pulses.  Exam reveals no friction rub.   No murmur heard. Pulmonary/Chest: Effort normal and breath sounds normal. She has no wheezes. She has no rales.  Abdominal: Soft. There is no tenderness.  Musculoskeletal: She exhibits no edema.  Lymphadenopathy:    She has no cervical adenopathy.  Neurological: She is alert and oriented to person, place, and time. No cranial nerve deficit.  Skin: Skin is warm and dry.  Psychiatric: She has a normal mood and affect.     Diagnostic Tests: CT CHEST WITH CONTRAST  Technique: Multidetector CT imaging of the chest was performed  following the standard protocol during bolus administration of  intravenous contrast.  Contrast: 80mL OMNIPAQUE IOHEXOL 300 MG/ML SOLN  Comparison: Chest radiograph on 09/15/2012  Findings: 1.8 cm spiculated nodule is seen in the posterior right  upper lobe corresponding to the nodular density seen on chest  radiograph. This is consistent with bronchogenic carcinoma. No  other suspicious pulmonary nodules or masses are identified.  No evidence of pleural or pericardial effusion. No evidence of  hilar or mediastinal lymphadenopathy. No adenopathy seen elsewhere  within the thorax.  A small hiatal hernia is noted. No evidence of chest wall mass or  suspicious bone lesions. Both adrenal glands are normal  appearance. Hepatic steatosis incidentally noted.  IMPRESSION:  1. 1.8 cm spiculated nodule in the posterior right upper lobe,  consistent with bronchogenic carcinoma Consultation with the [facility] [phone]) should be  considered.  2. No evidence of adenopathy or other signs of metastatic disease.  3. Incidentally noted small hiatal  hernia and hepatic steatosis.  Original Report Authenticated By: Myles Rosenthal, M.D.  PET/CT 10/12/2012  Reading pending Right upper lobe nodule is hypermetabolic No obvious regional or distant metastases.  Pulmonary function testing 10/01/12 FVC 2.36 (84% predicted) FEV1 1.72 (81% predicted) TLC 4.91 (103% predicted) DLCO 71%  Impression: 67 year old woman history of heavy tobacco abuse who was found to have a new right lung nodule. This is a 1.8 cm mass in the right upper lobe. It is hypermetabolic by PET. 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 It needs to be considered a lung cancer unless it can be proven otherwise. The only way to definitively prove otherwise would be to do an excisional biopsy. I don't think a needle biopsy would be helpful in this case, because of positive she would need surgery and if negative the index of suspicion is so high would still need a confirmatory wedge resection.  My recommendation to Nancy Hodges is to proceed  with a right VATS, wedge resection, possible right upper lobectomy if frozen section confirms that this is a lung cancer. Fortunately this does appear to be early stage (clinical stage IA). We will of course review the official report on the PET CT once it is available, but I do not see any evidence of metastatic disease.  I have discussed with the patient the general nature of the procedure, need for general anesthesia, and the incisions to be used. I discussed the expected hospital stay, overall recovery and short and long term outcomes. She understands the risks include, but are not limited to, death, stroke, MI, DVT/PE, bleeding, possible need for transfusion, infections, cardiac arrhythmias, prolonged air leaks, and other organ system dysfunction including respiratory, renal, or GI complications. She accepts these risks and wishes to proceed.  She is uncertain about timing because she lives alone and needs to arrange for assistance his  discharge. I recommended to her that we proceed as soon as she can make arrangements and that we may be able to assist her if assisted living type situation is needed.   Plan: Right VATS, wedge resection, possible right upper lobectomy based on intraoperative frozen section findings. Patient will call to schedule.

## 2012-10-27 NOTE — Interval H&P Note (Signed)
History and Physical Interval Note:  10/27/2012 8:34 AM  Nancy Hodges  has presented today for surgery, with the diagnosis of RIGHT UPPER LOBE MASS  The various methods of treatment have been discussed with the patient and family. After consideration of risks, benefits and other options for treatment, the patient has consented to  Procedure(s) with comments: VIDEO ASSISTED THORACOSCOPY (VATS)/WEDGE RESECTION (Right) LOBECTOMY (Right) - POSS RIGHT UPPER LOBECTOMY as a surgical intervention .  The patient's history has been reviewed, patient examined, no change in status, stable for surgery.  I have reviewed the patient's chart and labs.  Questions were answered to the patient's satisfaction.     Erek Kowal C

## 2012-10-27 NOTE — Anesthesia Postprocedure Evaluation (Signed)
Anesthesia Post Note  Patient: Nancy Hodges  Procedure(s) Performed: Procedure(s) (LRB): VIDEO ASSISTED THORACOSCOPY (VATS),RGHT UPPER LOBE LUNG WEDGE RESECTION (Right) LOBECTOMY (Right)  Anesthesia type: General  Patient location: PACU  Post pain: Pain level controlled and Adequate analgesia  Post assessment: Post-op Vital signs reviewed, Patient's Cardiovascular Status Stable, Respiratory Function Stable, Patent Airway and Pain level controlled  Last Vitals:  Filed Vitals:   10/27/12 1355  BP:   Pulse:   Temp:   Resp: 11    Post vital signs: Reviewed and stable  Level of consciousness: awake, alert  and oriented  Complications: No apparent anesthesia complications

## 2012-10-27 NOTE — Anesthesia Preprocedure Evaluation (Addendum)
Anesthesia Evaluation  Patient identified by MRN, date of birth, ID band Patient awake    Reviewed: Allergy & Precautions, H&P , NPO status , Patient's Chart, lab work & pertinent test results, reviewed documented beta blocker date and time   History of Anesthesia Complications Negative for: history of anesthetic complications  Airway Mallampati: II TM Distance: <3 FB Neck ROM: Full    Dental  (+) Edentulous Upper, Poor Dentition and Loose,    Pulmonary asthma , former smoker,  breath sounds clear to auscultation        Cardiovascular + dysrhythmias Atrial Fibrillation + pacemaker Rhythm:Regular Rate:Normal     Neuro/Psych  Headaches, PSYCHIATRIC DISORDERS Anxiety negative psych ROS   GI/Hepatic Neg liver ROS, hiatal hernia, GERD-  Controlled,  Endo/Other  negative endocrine ROS  Renal/GU negative Renal ROS     Musculoskeletal negative musculoskeletal ROS (+)   Abdominal   Peds  Hematology negative hematology ROS (+)   Anesthesia Other Findings Two bottom teeth very loose, pt aware.tb  Reproductive/Obstetrics negative OB ROS                         Anesthesia Physical Anesthesia Plan  ASA: III  Anesthesia Plan: General   Post-op Pain Management:    Induction: Intravenous  Airway Management Planned: Double Lumen EBT  Additional Equipment: Arterial line and CVP  Intra-op Plan:   Post-operative Plan: Extubation in OR  Informed Consent: I have reviewed the patients History and Physical, chart, labs and discussed the procedure including the risks, benefits and alternatives for the proposed anesthesia with the patient or authorized representative who has indicated his/her understanding and acceptance.   Dental advisory given  Plan Discussed with: CRNA and Surgeon  Anesthesia Plan Comments: (R. Upper lobe lung mass Brady-tachy syndrome with leadless pacemaker Loose lower teeth pt  aware of risks of dislodgement H/O asthma GERD  Plan GA )        Anesthesia Quick Evaluation

## 2012-10-28 ENCOUNTER — Encounter (HOSPITAL_COMMUNITY): Payer: Self-pay

## 2012-10-28 ENCOUNTER — Inpatient Hospital Stay (HOSPITAL_COMMUNITY): Payer: Medicare Other

## 2012-10-28 LAB — CBC
HCT: 35.2 % — ABNORMAL LOW (ref 36.0–46.0)
MCHC: 34.9 g/dL (ref 30.0–36.0)
Platelets: 252 10*3/uL (ref 150–400)
RDW: 12.7 % (ref 11.5–15.5)
WBC: 17.3 10*3/uL — ABNORMAL HIGH (ref 4.0–10.5)

## 2012-10-28 LAB — BLOOD GAS, ARTERIAL
Bicarbonate: 23.8 mEq/L (ref 20.0–24.0)
O2 Saturation: 98.8 %
Patient temperature: 98.6
TCO2: 25.2 mmol/L (ref 0–100)
pH, Arterial: 7.337 — ABNORMAL LOW (ref 7.350–7.450)

## 2012-10-28 LAB — BASIC METABOLIC PANEL
CO2: 28 mEq/L (ref 19–32)
Calcium: 8.8 mg/dL (ref 8.4–10.5)
Creatinine, Ser: 0.55 mg/dL (ref 0.50–1.10)
GFR calc non Af Amer: >90 mL/min (ref >90)
Glucose, Bld: 190 mg/dL — ABNORMAL HIGH (ref 70–99)
Sodium: 133 mEq/L — ABNORMAL LOW (ref 135–145)

## 2012-10-28 MED ORDER — POTASSIUM CHLORIDE IN NACL 20-0.45 MEQ/L-% IV SOLN
INTRAVENOUS | Status: DC
Start: 2012-10-28 — End: 2012-10-29
  Administered 2012-10-28 (×2): via INTRAVENOUS
  Filled 2012-10-28 (×3): qty 1000

## 2012-10-28 MED ORDER — PANTOPRAZOLE SODIUM 40 MG PO TBEC
40.0000 mg | DELAYED_RELEASE_TABLET | Freq: Every day | ORAL | Status: DC
  Administered 2012-10-28 – 2012-11-05 (×9): 40 mg via ORAL
  Filled 2012-10-28 (×8): qty 1

## 2012-10-28 MED ORDER — CALCIUM CARBONATE ANTACID 500 MG PO CHEW
1.0000 | CHEWABLE_TABLET | Freq: Three times a day (TID) | ORAL | Status: DC | PRN
  Administered 2012-10-28 – 2012-11-03 (×4): 200 mg via ORAL
  Filled 2012-10-28 (×4): qty 1

## 2012-10-28 MED ORDER — METOCLOPRAMIDE HCL 5 MG/ML IJ SOLN
10.0000 mg | Freq: Four times a day (QID) | INTRAMUSCULAR | Status: AC
  Administered 2012-10-28 – 2012-10-29 (×4): 10 mg via INTRAVENOUS
  Filled 2012-10-28 (×4): qty 2

## 2012-10-28 MED ORDER — SODIUM CHLORIDE 0.9 % IJ SOLN
INTRAMUSCULAR | Status: AC
  Administered 2012-10-28: 10 mL
  Filled 2012-10-28: qty 10

## 2012-10-28 MED ORDER — ENOXAPARIN SODIUM 30 MG/0.3ML ~~LOC~~ SOLN
40.0000 mg | SUBCUTANEOUS | Status: DC
  Administered 2012-10-28 – 2012-10-31 (×4): 40 mg via SUBCUTANEOUS
  Filled 2012-10-28 (×6): qty 0.4

## 2012-10-28 NOTE — Progress Notes (Addendum)
1 Day Post-Op Procedure(s) (LRB): VIDEO ASSISTED THORACOSCOPY (VATS),RGHT UPPER LOBE LUNG WEDGE RESECTION (Right) LOBECTOMY (Right) Subjective:  Nancy Hodges complains of reflux symptoms this morning.  She is requesting some TUMS for relief.  She also states that she has a very dry cough which is making her uncomfortable  Objective: Vital signs in last 24 hours: Temp:  [96.8 F (36 C)-98.5 F (36.9 C)] 97.5 F (36.4 C) (03/27 0742) Pulse Rate:  [65-91] 65 (03/27 0347) Cardiac Rhythm:  [-] Normal sinus rhythm (03/27 0347) Resp:  [9-22] 12 (03/27 0742) BP: (106-128)/(46-71) 114/48 mmHg (03/27 0742) SpO2:  [96 %-99 %] 97 % (03/27 0742) Arterial Line BP: (87-131)/(48-69) 107/69 mmHg (03/26 2325) Weight:  [165 lb (74.844 kg)] 165 lb (74.844 kg) (03/26 1550)  Intake/Output from previous day: 03/26 0701 - 03/27 0700 In: 3000 [I.V.:2700; IV Piggyback:300] Out: 1705 [Urine:1255; Blood:100; Chest Tube:350]  General appearance: alert, cooperative and no distress Heart: regular rate and rhythm Lungs: clear to auscultation bilaterally Abdomen: soft, non-tender; bowel sounds normal; no masses,  no organomegaly Wound: clean and dry  Lab Results:  Recent Labs  10/25/12 1300 10/28/12 0425  WBC 9.0 17.3*  HGB 14.3 12.3  HCT 39.9 35.2*  PLT 292 252   BMET:  Recent Labs  10/25/12 1300 10/28/12 0425  NA 138 133*  K 4.0 4.4  CL 101 98  CO2 23 28  GLUCOSE 101* 190*  BUN 10 8  CREATININE 0.56 0.55  CALCIUM 11.0* 8.8    PT/INR:  Recent Labs  10/25/12 1300  LABPROT 13.5  INR 1.04   ABG    Component Value Date/Time   PHART 7.337* 10/28/2012 0251   HCO3 23.8 10/28/2012 0251   TCO2 25.2 10/28/2012 0251   ACIDBASEDEF 1.2 10/28/2012 0251   O2SAT 98.8 10/28/2012 0251   CBG (last 3)  No results found for this basename: GLUCAP,  in the last 72 hours  Assessment/Plan: S/P Procedure(s) (LRB): VIDEO ASSISTED THORACOSCOPY (VATS),RGHT UPPER LOBE LUNG WEDGE RESECTION (Right) LOBECTOMY  (Right)   1. Chest tube- + air leak, on suction 350 cc output since surgery, leave tube on suction for now 2. Reflux- patient requesting TUMS, declined PPI at this time 3. Expected Blood Loss  Anemia- mild Hgb 12.3 4. CXR- post surgical changes, tiny basilar pneumothorax 5. Dispo- patient stable, will order TUMS, d/c arterial lines, foley   LOS: 1 day    Raford Pitcher, ERIN 10/28/2012  Patient seen and examined. Agree with above. Keep both CT to suction today Lots of nausea- will give reglan for 24 hours

## 2012-10-29 ENCOUNTER — Inpatient Hospital Stay (HOSPITAL_COMMUNITY): Payer: Medicare Other

## 2012-10-29 LAB — COMPREHENSIVE METABOLIC PANEL
ALT: 37 U/L — ABNORMAL HIGH (ref 0–35)
AST: 30 U/L (ref 0–37)
Albumin: 3 g/dL — ABNORMAL LOW (ref 3.5–5.2)
Calcium: 9.2 mg/dL (ref 8.4–10.5)
Creatinine, Ser: 0.77 mg/dL (ref 0.50–1.10)
GFR calc non Af Amer: 84 mL/min — ABNORMAL LOW (ref >90)
Sodium: 130 mEq/L — ABNORMAL LOW (ref 135–145)
Total Protein: 6.7 g/dL (ref 6.0–8.3)

## 2012-10-29 LAB — CBC
MCH: 32.1 pg (ref 26.0–34.0)
MCHC: 33.7 g/dL (ref 30.0–36.0)
MCV: 95.3 fL (ref 78.0–100.0)
Platelets: 258 10*3/uL (ref 150–400)
RDW: 13.1 % (ref 11.5–15.5)

## 2012-10-29 MED ORDER — TRAMADOL HCL 50 MG PO TABS
50.0000 mg | ORAL_TABLET | Freq: Four times a day (QID) | ORAL | Status: DC | PRN
  Administered 2012-10-29 – 2012-11-05 (×16): 50 mg via ORAL
  Filled 2012-10-29 (×17): qty 1

## 2012-10-29 MED ORDER — DEXTROSE-NACL 5-0.9 % IV SOLN
INTRAVENOUS | Status: DC
  Administered 2012-10-29 – 2012-11-01 (×4): via INTRAVENOUS

## 2012-10-29 NOTE — Progress Notes (Signed)
Pt observed to be sleepy throughout day. Pt helped to chair. Pain PCA discouraged by RN due to increased sleeping, PA rounded on pt and aware of current status, nursing will cont to monitor

## 2012-10-29 NOTE — Progress Notes (Signed)
RT got patient to performed flutter patient gave moderate performance. Patient had no productive cough. RT will continue to monitor.

## 2012-10-29 NOTE — Progress Notes (Addendum)
                   [address]            Nancy Hodges [id number]          [phone]    2 Days Post-Op Procedure(s) (LRB): VIDEO ASSISTED THORACOSCOPY (VATS),RGHT UPPER LOBE LUNG WEDGE RESECTION (Right) LOBECTOMY (Right)  Subjective: Patient with some nausea this am. She is also "sleepy" but arousable.  Objective: Vital signs in last 24 hours: Temp:  [97.9 F (36.6 C)-99.1 F (37.3 C)] 98.5 F (36.9 C) (03/28 0743) Pulse Rate:  [76-89] 89 (03/28 0340) Cardiac Rhythm:  [-] Normal sinus rhythm (03/28 0340) Resp:  [12-26] 14 (03/28 0340) BP: (139-156)/(49-87) 141/56 mmHg (03/28 0340) SpO2:  [95 %-100 %] 97 % (03/28 0340)     Intake/Output from previous day: 03/27 0701 - 03/28 0700 In: 2313.8 [P.O.:710; I.V.:1603.8] Out: 2455 [Urine:1875; Chest Tube:580]   Physical Exam:  Cardiovascular: RRR Pulmonary: Clear on left;coarse breath sounds on the right; no rales, wheezes, or rhonchi. Abdomen: Soft, non tender, bowel sounds present. Extremities: Mild bilateral lower extremity edema. Wounds: Dressing is clean and dry.   Chest Tube: to suction, intermittent +1 air leak  Lab Results: CBC: Recent Labs  10/28/12 0425 10/29/12 0418  WBC 17.3* 19.3*  HGB 12.3 12.4  HCT 35.2* 36.8  PLT 252 258   BMET:  Recent Labs  10/28/12 0425 10/29/12 0418  NA 133* 130*  K 4.4 5.5*  CL 98 97  CO2 28 26  GLUCOSE 190* 155*  BUN 8 12  CREATININE 0.55 0.77  CALCIUM 8.8 9.2    PT/INR: No results found for this basename: LABPROT, INR,  in the last 72 hours ABG:  INR: Will add last result for INR, ABG once components are confirmed Will add last 4 CBG results once components are confirmed  Assessment/Plan:  1. CV - SR 2.  Pulmonary - Chest tube with 580 cc of output and there is an intermittent +1 air leak.CXR this am appears stable.Chest tube to remain for now. Check CXR in am.Encourage incentive spirometer and flutter valve. 3.Hyponatremia-sodium  130 4.Hyperkalemia-potassium 5.5. Will change IVF and re check later today 5.Nausea-no emesis or abdominal pain. Zofran PRN and monitor  ZIMMERMAN,Nancy Hodges MPA-C 10/29/2012,8:10 AM  Very sleepy, opens eyes  arousable  Will decrease pain medication  I have seen and examined Nancy Hodges and agree with the above assessment  and plan.  Delight Ovens MD Beeper [phone] Office [phone] 10/29/2012 4:01 PM

## 2012-10-29 NOTE — Clinical Documentation Improvement (Signed)
Abnormal Labs Clarification  THIS DOCUMENT IS NOT A PERMANENT PART OF THE MEDICAL RECORD  TO RESPOND TO THE THIS QUERY, FOLLOW THE INSTRUCTIONS BELOW:  1. If needed, update documentation for the patient's encounter via the notes activity.  2. Access this query again and click edit on the [employer].  3. After updating, or not, click F2 to complete all highlighted (required) fields concerning your review. Select "additional documentation in the medical record" OR "no additional documentation provided".  4. Click Sign note button.  5. The deficiency will fall out of your InBasket *Please let us know if you are not able to complete this workflow by phone or e-mail (listed below).  Please update your documentation within the medical record to reflect your response to this query.                                                                                   10/29/12  Dear Doree Fudge PA-C [facility]  In a better effort to capture your patient's severity of illness, reflect appropriate length of stay and utilization of resources, a review of the medical record has revealed the following indicators.    Based on your clinical judgment, please clarify and document in a progress note and/or discharge summary the clinical condition associated with the following supporting information:  In responding to this query please exercise your independent judgment.  The fact that a query is asked, does not imply that any particular answer is desired or expected.  Abnormal findings (laboratory, x-ray, pathologic, and other diagnostic results) are not coded and reported unless the physician indicates their clinical significance.   The medical record reflects the following clinical findings, please clarify the diagnostic and/or clinical significance:      Noted "bibasilar atelectasis" on chest xray.  Pt on 3L [place] satting in upper 90's, ordered on flutter valve, ISSP and albuterol inhalers q 4  hours. .  If finding is an appropriate secondary diagnosis please document in chart.  Thank you   Possible Clinical Conditions?                                   bibasilar atelectasis                                  Other Condition  Cannot Clinically Determine         Treatment: flutter valve, ISSP,  and albuterol inhalers q 4 hours.        DG CHEST PORT 1 VIEW 10/29/12 Impression: 3. Bibasilar atelectasis, right greater than left   DG CHEST PORT 1 VIEW 10/28/12  IMPRESSION:  Tiny right basilar pneumothorax with two chest tubes in place.    Evaluation/  Monitor: chest x rays x 2     Reviewed:leukocytosis likely atelectasis note by Dr. Wynetta Fines 10/31/12  Thank You,  Lavonda Jumbo  Clinical Documentation Specialist: Pager [phone]  [facility]

## 2012-10-29 NOTE — Progress Notes (Signed)
RT got patient to perform incentive and patient was only able to get at the most.

## 2012-10-30 ENCOUNTER — Inpatient Hospital Stay (HOSPITAL_COMMUNITY): Payer: Medicare Other

## 2012-10-30 DIAGNOSIS — I4891 Unspecified atrial fibrillation: Secondary | ICD-10-CM

## 2012-10-30 LAB — URINALYSIS, ROUTINE W REFLEX MICROSCOPIC
Bilirubin Urine: NEGATIVE
Glucose, UA: 250 mg/dL — AB
Ketones, ur: NEGATIVE mg/dL
Protein, ur: NEGATIVE mg/dL

## 2012-10-30 LAB — BASIC METABOLIC PANEL
CO2: 28 mEq/L (ref 19–32)
Chloride: 99 mEq/L (ref 96–112)
GFR calc non Af Amer: >90 mL/min (ref >90)
Glucose, Bld: 165 mg/dL — ABNORMAL HIGH (ref 70–99)
Potassium: 4.2 mEq/L (ref 3.5–5.1)
Sodium: 132 mEq/L — ABNORMAL LOW (ref 135–145)

## 2012-10-30 MED ORDER — DILTIAZEM HCL ER COATED BEADS 120 MG PO CP24
120.0000 mg | ORAL_CAPSULE | Freq: Once | ORAL | Status: AC
  Administered 2012-10-30: 120 mg via ORAL
  Filled 2012-10-30: qty 1

## 2012-10-30 NOTE — Progress Notes (Signed)
I wasted 25mL Fentanyl with Oscar La, RN.  [place], [place], Charity fundraiser

## 2012-10-30 NOTE — Consult Note (Signed)
Reason for Consult: Atrial fibrillation with rapid ventricular response Referring Physician: TCTS Primary cardiologist: Dr. Irven Baltimore Nancy Hodges is an 67 y.o. female.  HPI: This pleasant 67 year old woman is seen in cardiology consultation because of atrial fibrillation with rapid ventricular response.  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 Hillis Range on 09/14/12.  She has a SJM nannostim VVI investigational pacemaker.  During her evaluation for pacemaker she was found to have a right lung nodule on chest x-ray and a CT of the chest showed a 1.8 cm mass in the right upper lobe.  PET scan showed that the mass was hypermetabolic.  The patient underwent right video-assisted thoracoscopy with right upper lobectomy and mediastinal lymph node dissection and On-Q catheter placement by Dr. Dorris Fetch on 10/27/12.  She was in normal sinus rhythm on term late this morning when she went into atrial fibrillation with rapid ventricular response.  She has received her daily dose of diltiazem 240 mg daily.  She denies any chest discomfort or shortness of breath.  She still has some residual chest wall soreness on the right side from her previous recent thoracoscopy.  Past Medical History  Diagnosis Date  . Asthma   . History of sinusitis   . Allergic rhinitis   . History of kidney stones   . 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 H/O hiatal hernia   . GERD (gastroesophageal reflux disease)   . Pulmonary nodule   . Paroxysmal atrial fibrillation     CHADS score: 0  . Atrial flutter   . Tachycardia-bradycardia syndrome   . Anxiety   . Headache     sinus    Past Surgical History  Procedure Laterality Date  . Partial hysterectomy    . Pacemaker insertion  09/14/2012    Nanostim (SJM) leadless pacemaker (LEADLESS II STUDY PATIENT) implanted by Dr Johney Frame  . Video assisted thoracoscopy  (vats)/wedge resection Right 10/27/2012    Procedure: VIDEO ASSISTED THORACOSCOPY (VATS),RGHT UPPER LOBE LUNG WEDGE RESECTION;  Surgeon: Loreli Slot, MD;  Location: [facility] OR;  Service: Thoracic;  Laterality: Right;  . Lobectomy Right 10/27/2012    Procedure: LOBECTOMY;  Surgeon: Loreli Slot, MD;  Location: [facility] OR;  Service: Thoracic;  Laterality: Right;   RIGHT UPPER LOBECTOMY & Node Dissection    Family History  Problem Relation Age of Onset  . Heart disease Mother   . Cancer Mother   . Diabetes Mother   . Asthma Father   . Heart disease Father   . Cancer Brother   . Diabetes Sister   . Cancer Sister   . Diabetes Brother   . Heart attack Son     Social History:  reports that she quit smoking about 17 years ago. Her smoking use included Cigarettes. She has a 80 pack-year smoking history. She does not have any smokeless tobacco history on file. She reports that she does not drink alcohol or use illicit drugs.  Allergies:  Allergies  Allergen Reactions  . Prednisone Shortness Of Breath and Other (See Comments)    REACTION: double vision  . Septra (Sulfamethoxazole W-Trimethoprim) Shortness Of Breath  . Vioxx (Rofecoxib) Other (See Comments)    REACTION:  Stomach cramps  . Penicillins Swelling and Rash    Medications:  Scheduled: . bisacodyl  10 mg Oral Daily  . diltiazem  240 mg Oral Daily  . enoxaparin  40 mg  Subcutaneous Q24H  . loratadine  10 mg Oral Daily  . pantoprazole  40 mg Oral Q1200    Results for orders placed during the hospital encounter of 10/27/12 (from the past 48 hour(s))  CBC     Status: Abnormal   Collection Time    10/29/12  4:18 AM      Result Value Range   WBC 19.3 (*) 4.0 - 10.5 K/uL   RBC 3.86 (*) 3.87 - 5.11 MIL/uL   Hemoglobin 12.4  12.0 - 15.0 g/dL   HCT 01.0  27.2 - 53.6 %   MCV 95.3  78.0 - 100.0 fL   MCH 32.1  26.0 - 34.0 pg   MCHC 33.7  30.0 - 36.0 g/dL   RDW 64.4  03.4 - 74.2 %   Platelets 258  150 - 400 K/uL    COMPREHENSIVE METABOLIC PANEL     Status: Abnormal   Collection Time    10/29/12  4:18 AM      Result Value Range   Sodium 130 (*) 135 - 145 mEq/L   Potassium 5.5 (*) 3.5 - 5.1 mEq/L   Comment: DELTA CHECK NOTED     NO VISIBLE HEMOLYSIS   Chloride 97  96 - 112 mEq/L   CO2 26  19 - 32 mEq/L   Glucose, Bld 155 (*) 70 - 99 mg/dL   BUN 12  6 - 23 mg/dL   Creatinine, Ser 5.95  0.50 - 1.10 mg/dL   Calcium 9.2  8.4 - 63.8 mg/dL   Total Protein 6.7  6.0 - 8.3 g/dL   Albumin 3.0 (*) 3.5 - 5.2 g/dL   AST 30  0 - 37 U/L   ALT 37 (*) 0 - 35 U/L   Alkaline Phosphatase 84  39 - 117 U/L   Total Bilirubin 0.9  0.3 - 1.2 mg/dL   GFR calc non Af Amer 84 (*) >90 mL/min   GFR calc Af Amer >90  >90 mL/min   Comment:            The eGFR has been calculated     using the CKD EPI equation.     This calculation has not been     validated in all clinical     situations.     eGFR's persistently     <90 mL/min signify     possible Chronic Kidney Disease.  POTASSIUM     Status: Abnormal   Collection Time    10/29/12 11:00 AM      Result Value Range   Potassium 5.4 (*) 3.5 - 5.1 mEq/L  BASIC METABOLIC PANEL     Status: Abnormal   Collection Time    10/30/12  4:29 AM      Result Value Range   Sodium 132 (*) 135 - 145 mEq/L   Potassium 4.2  3.5 - 5.1 mEq/L   Comment: DELTA CHECK NOTED     NO VISIBLE HEMOLYSIS   Chloride 99  96 - 112 mEq/L   CO2 28  19 - 32 mEq/L   Glucose, Bld 165 (*) 70 - 99 mg/dL   BUN 10  6 - 23 mg/dL   Creatinine, Ser 7.56  0.50 - 1.10 mg/dL   Comment: DELTA CHECK NOTED   Calcium 8.5  8.4 - 10.5 mg/dL   GFR calc non Af Amer >90  >90 mL/min   GFR calc Af Amer >90  >90 mL/min   Comment:  The eGFR has been calculated     using the CKD EPI equation.     This calculation has not been     validated in all clinical     situations.     eGFR's persistently     <90 mL/min signify     possible Chronic Kidney Disease.  URINALYSIS, ROUTINE W REFLEX MICROSCOPIC      Status: Abnormal   Collection Time    10/30/12 12:04 PM      Result Value Range   Color, Urine YELLOW  YELLOW   APPearance CLOUDY (*) CLEAR   Specific Gravity, Urine 1.011  1.005 - 1.030   pH 6.0  5.0 - 8.0   Glucose, UA 250 (*) NEGATIVE mg/dL   Hgb urine dipstick NEGATIVE  NEGATIVE   Bilirubin Urine NEGATIVE  NEGATIVE   Ketones, ur NEGATIVE  NEGATIVE mg/dL   Protein, ur NEGATIVE  NEGATIVE mg/dL   Urobilinogen, UA 0.2  0.0 - 1.0 mg/dL   Nitrite NEGATIVE  NEGATIVE   Leukocytes, UA TRACE (*) NEGATIVE  URINE MICROSCOPIC-ADD ON     Status: None   Collection Time    10/30/12 12:04 PM      Result Value Range   Squamous Epithelial / LPF RARE  RARE   WBC, UA 0-2  <3 WBC/hpf    Dg Chest Port 1 View  10/30/2012  *RADIOLOGY REPORT*  Clinical Data: Postop day three for right VATS and right upper lobectomy with mediastinal lymph node dissection due to 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  Chest tubes in place.  PORTABLE CHEST - 1 VIEW  Comparison: 10/29/2012  Findings: Support apparatus stable with right IJ line tip in the lower SVC, with two right-sided chest tubes with similar orientation and position to yesterday's exam.  Volume loss of the right chest noted with indistinct opacities at the right lung base and right mid lung.  Cardiac and mediastinal contour differences from prior exam are due to mild leftward rotation on today's exam and rightward rotation on the prior exam.  There is minimal subsegmental atelectasis at the left lung base. No pneumothorax is identified.  Heart size within normal limits.  IMPRESSION:  1.  Vague opacity in the right midlung and right lung base favors atelectasis over pneumonia. 2.  Subsegmental atelectasis along the left hemidiaphragm. 3.  Stable support apparatus with no pneumothorax observed.   Original Report Authenticated By: Gaylyn Rong, M.D.    Dg Chest Port 1 View  10/29/2012  *RADIOLOGY REPORT*  Clinical Data: Pneumothorax.  Chest tube  placement.  PORTABLE CHEST - 1 VIEW  Comparison: One-view chest 10/28/2012.  Findings: The patient is rotated to the right.  To right-sided chest tubes are in place.  No definite pneumothorax is present on today's exam.  A double-lumen right IJ catheter is stable in position.  Mild interstitial edema is stable.  Bibasilar atelectasis persists, worse on the right.  IMPRESSION:  1.  Right-sided chest tubes are in place.  No visible pneumothorax is present. 2.  Stable mild interstitial edema. 3.  Bibasilar atelectasis, right greater than left.   Original Report Authenticated By: Marin Roberts, M.D.     Review of systems otherwise unremarkable except as noted in present illness. Blood pressure 161/69, pulse 98, temperature 99.2 F (37.3 C), temperature source Oral, resp. rate 22, height 5\' 2"  (1.575 m), weight 165 lb (74.844 kg), SpO2 94.00%. The patient appears to be in no distress.  Head and neck exam reveals that  the pupils are equal and reactive.  The extraocular movements are full.  There is no scleral icterus.  Mouth and pharynx are benign.  No lymphadenopathy.  No carotid bruits.  The jugular venous pressure is normal.  Thyroid is not enlarged or tender.  Chest reveals decreased breath sounds at right base.  Heart reveals no abnormal lift or heave.  First and second heart sounds are normal.  There is no murmur gallop rub or click.  The rhythm is irregular.  The abdomen is soft and nontender.  Bowel sounds are normoactive.  There is no hepatosplenomegaly or mass.  There are no abdominal bruits.  Extremities reveal no phlebitis or edema.  Pedal pulses are good.  There is no cyanosis or clubbing.  Neurologic exam is normal strength and no lateralizing weakness.  No sensory deficits.  Integument reveals no rash 2D Echo on 08/05/12: Study Conclusions  - Left ventricle: The cavity size was normal. There was mild focal basal hypertrophy of the septum. Systolic function was normal. The  estimated ejection fraction was in the range of 55% to 60%. Wall motion was normal; there were no regional wall motion abnormalities. Doppler parameters are consistent with abnormal left ventricular relaxation (grade 1 diastolic dysfunction). - Aortic valve: Valve area: 1.91cm^2(VTI). Valve area: 2.08cm^2 (Vmax). - Mitral valve: Calcified annulus. Mild regurgitation. - Left atrium: The atrium was mildly dilated.  EKG: Pending Assessment/Plan: 1. recurrent atrial fibrillation postoperatively in a patient with known paroxysmal atrial fibrillation and tachybradycardia syndrome.  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 Patient is tolerating atrial fib without new symptoms. 2. S/P recent RUL lung wedge resection.  Plan:  Will give additional oral diltiazem now for rate control. Presently on medical dose Lovenox. Consider increase to full dose lovenox or restart of xarelto when okay from surgery standpoint.  Will follow with you.  Cassell Clement 10/30/2012, 3:09 PM

## 2012-10-30 NOTE — Progress Notes (Addendum)
                    [address]            [place] [address]          [phone]     3 Days Post-Op Procedure(s) (LRB): VIDEO ASSISTED THORACOSCOPY (VATS),RGHT UPPER LOBE LUNG WEDGE RESECTION (Right) LOBECTOMY (Right)  Subjective: RN reports that pt has been having voiding difficulty since Foley removed.  She has been voiding small amounts, but not completely emptying.  Last PVR was around 500 ml, and required I/O cath.   She reports she had issues with voiding after childbirth, but no other GU history.  Weak, still very sleepy.  +Cough.  Pain controlled.   Objective: Vital signs in last 24 hours: Patient Vitals for the past 24 hrs:  BP Temp Temp src Pulse Resp SpO2  10/30/12 0450 144/55 mmHg 99.4 F (37.4 C) Oral 88 14 98 %  10/30/12 0105 117/44 mmHg 99.3 F (37.4 C) Oral 80 8 99 %  10/29/12 2035 139/49 mmHg 99.1 F (37.3 C) Oral 84 13 100 %  10/29/12 1644 122/61 mmHg 98.2 F (36.8 C) Oral - 8 99 %  10/29/12 1200 - - - - 6 97 %  10/29/12 1117 154/62 mmHg 99.4 F (37.4 C) Oral 92 6 97 %  10/29/12 0900 - - - - - 96 %  10/29/12 0800 - - - - 16 98 %   Current Weight  10/27/12 165 lb (74.844 kg)     Intake/Output from previous day: 03/28 0701 - 03/29 0700 In: 2467.5 [P.O.:780; I.V.:1687.5] Out: 1315 [Urine:925; Chest Tube:390]    PHYSICAL EXAM:  Heart: RRR Lungs:Coarse rhonchi bilaterally Wound: Clean and dry Chest tube: continuous 1/7 air leak, increased with cough   Lab Results: CBC: Recent Labs  10/28/12 0425 10/29/12 0418  WBC 17.3* 19.3*  HGB 12.3 12.4  HCT 35.2* 36.8  PLT 252 258   BMET:  Recent Labs  10/29/12 0418 10/29/12 1100 10/30/12 0429  NA 130*  --  132*  K 5.5* 5.4* 4.2  CL 97  --  99  CO2 26  --  28  GLUCOSE 155*  --  165*  BUN 12  --  10  CREATININE 0.77  --  0.51  CALCIUM 9.2  --  8.5    PT/INR: No results found for this basename: LABPROT, INR,  in the last 72 hours    Assessment/Plan: S/P  Procedure(s) (LRB): VIDEO ASSISTED THORACOSCOPY (VATS),RGHT UPPER LOBE LUNG WEDGE RESECTION (Right) LOBECTOMY (Right) CT still with high output, + air leak.  Will leave CT to suction for now. Remains drowsy, but arousable, and improved from yesterday.  Minimize narcotics and watch. Leukocytosis, no fever.  Likely atelectasis, but with GU issues, will check U/A to rule out UTI. GU- urinary retention.  Will try to I/O cath once more if she is unable to void.  If output is high, will replace Foley and watch. Deconditioning- hard to mobilize.  Will order PT consult. Encouraged increased pulm toilet/IS/flutter valve.   LOS: 3 days    COLLINS,GINA H 10/30/2012  Much more alert today since seen last night Still with air leak, leave ct in place I have seen and examined Leory Plowman and agree with the above assessment  and plan.  Delight Ovens MD Beeper [phone] Office [phone] 10/30/2012 11:48 AM

## 2012-10-30 NOTE — Progress Notes (Signed)
On-q d/c per protocol

## 2012-10-30 NOTE — Progress Notes (Signed)
Wasted 25ml of  fentynal with BMB (rn)

## 2012-10-30 NOTE — Progress Notes (Signed)
Pt HR 140-150, observered to be in Afib, pt with current hx, MD Tyrone Sage called and notified, [facility] notified of pt admission and to f/u per MD Tyrone Sage.

## 2012-10-31 ENCOUNTER — Inpatient Hospital Stay (HOSPITAL_COMMUNITY): Payer: Medicare Other

## 2012-10-31 LAB — BASIC METABOLIC PANEL
BUN: 10 mg/dL (ref 6–23)
CO2: 30 mEq/L (ref 19–32)
Calcium: 8.4 mg/dL (ref 8.4–10.5)
Chloride: 99 mEq/L (ref 96–112)
Creatinine, Ser: 0.44 mg/dL — ABNORMAL LOW (ref 0.50–1.10)
Glucose, Bld: 159 mg/dL — ABNORMAL HIGH (ref 70–99)

## 2012-10-31 LAB — CBC
HCT: 29.7 % — ABNORMAL LOW (ref 36.0–46.0)
MCH: 32.5 pg (ref 26.0–34.0)
MCV: 94.6 fL (ref 78.0–100.0)
Platelets: 217 10*3/uL (ref 150–400)
RBC: 3.14 MIL/uL — ABNORMAL LOW (ref 3.87–5.11)

## 2012-10-31 MED ORDER — AMIODARONE HCL IN DEXTROSE 360-4.14 MG/200ML-% IV SOLN
60.0000 mg/h | INTRAVENOUS | Status: DC
  Administered 2012-10-31: 60 mg/h via INTRAVENOUS
  Administered 2012-10-31: 30 mg/h via INTRAVENOUS
  Administered 2012-11-02: 30.06 mg/h via INTRAVENOUS
  Filled 2012-10-31 (×14): qty 200

## 2012-10-31 NOTE — Evaluation (Signed)
Physical Therapy Evaluation Patient Details Name: Nancy Hodges MRN: [id number] DOB: Jul 17, 1946 Today's Date: 10/31/2012 Time: 7829-5621 PT Time Calculation (min): 36 min  PT Assessment / Plan / Recommendation Clinical Impression  67 yo previously independent presents with significant limitations to mobility due to muscle weakness and early fatigue.  Pt reports history or motion provoked dizziness consistent with vestibular dysfunction, recommend assessment once invasive medical equipment is d/c and pt more freely mobile.  Improved strength today compared to RN report of previous day, still limited by muscular endurance deficits. Pt has friend who can provide care at d/c though pt confided she prefers to d/c home alone if possible.  Discussed several d/c scenarios including HHPT vs. short term SNF -- anticipate HHPT at this point.  Plan to continue mobility retraining as detailed below.    PT Assessment  Patient needs continued PT services    Follow Up Recommendations  Home health PT    Does the patient have the potential to tolerate intense rehabilitation      Barriers to Discharge None As above, pt has friend willing to help at d/c.    Equipment Recommendations  Rolling walker with 5" wheels    Recommendations for Other Services     Frequency Min 3X/week    Precautions / Restrictions Precautions Precautions: Other (comment) (chest tube Right) Restrictions Weight Bearing Restrictions: No   Pertinent Vitals/Pain R chest wall 5/10 breathing ('mild') vs. 9/10 with movement      Mobility  Bed Mobility Bed Mobility: Supine to Sit Supine to Sit: 3: Mod assist;HOB elevated Details for Bed Mobility Assistance: physical assist to raise trunk, maneuver hips to right EOB and initially come to sitting Transfers Transfers: Sit to Stand;Stand to Sit;Stand Pivot Transfers Sit to Stand: 4: Min assist;From bed;With upper extremity assist Stand to Sit: 4: Min assist;To  chair/3-in-1 Stand Pivot Transfers: 3: Mod assist Details for Transfer Assistance: contact assist to steady and for safety moving into/out of standing, verbal cues for control of speed and alignment,  Ambulation/Gait Ambulation/Gait Assistance Details: not assessed due to chest tube to wall    Exercises     PT Diagnosis: Generalized weakness;Acute pain  PT Problem List: Decreased strength;Decreased range of motion;Decreased activity tolerance;Decreased mobility;Cardiopulmonary status limiting activity;Pain PT Treatment Interventions: DME instruction;Gait training;Functional mobility training;Therapeutic activities;Patient/family education (assess vestibular function)   PT Goals Acute Rehab PT Goals PT Goal Formulation: With patient Time For Goal Achievement: 11/14/12 Potential to Achieve Goals: Good Pt will go Supine/Side to Sit: with supervision;with HOB 0 degrees PT Goal: Supine/Side to Sit - Progress: Goal set today Pt will go Sit to Supine/Side: with supervision;with HOB 0 degrees PT Goal: Sit to Supine/Side - Progress: Goal set today Pt will go Sit to Stand: with supervision;with upper extremity assist PT Goal: Sit to Stand - Progress: Goal set today Pt will go Stand to Sit: with supervision;with upper extremity assist PT Goal: Stand to Sit - Progress: Goal set today Pt will Transfer Bed to Chair/Chair to Bed: with supervision PT Transfer Goal: Bed to Chair/Chair to Bed - Progress: Goal set today Pt will Ambulate: 51 - 150 feet;with supervision;with least restrictive assistive device PT Goal: Ambulate - Progress: Goal set today  Visit Information  Last PT Received On: 10/31/12 Assistance Needed: +1    Subjective Data  Subjective: I really want to go home alone instead of going home with my friend, but I don't want to hurt her feelings. Patient Stated Goal: return to home without help  Prior Functioning  Home Living Lives With: Alone Available Help at Discharge:  Friend(s) Type of Home: House Home Access: Level entry Home Layout: One level Home Adaptive Equipment: None Prior Function Level of Independence: Independent Able to Take Stairs?: Yes Driving: Yes Communication Communication: No difficulties    Cognition  Cognition Overall Cognitive Status: Appears within functional limits for tasks assessed/performed Arousal/Alertness: Awake/alert Orientation Level: Appears intact for tasks assessed Behavior During Session: [facility] for tasks performed    Extremity/Trunk Assessment Right Upper Extremity Assessment RUE ROM/Strength/Tone: Due to pain (hurts when moving right arm) Left Upper Extremity Assessment LUE ROM/Strength/Tone: WFL for tasks assessed Right Lower Extremity Assessment RLE ROM/Strength/Tone: Deficits RLE ROM/Strength/Tone Deficits: generalized weakness for functional tasks Left Lower Extremity Assessment LLE ROM/Strength/Tone: Deficits LLE ROM/Strength/Tone Deficits: generalize weakness for functional tasks   Balance    End of Session PT - End of Session Equipment Utilized During Treatment: Oxygen Activity Tolerance: Patient limited by fatigue;Patient limited by pain Patient left: in chair;with chair alarm set;with call bell/phone within reach Nurse Communication: Mobility status  GP     Dennis Bast 10/31/2012, 11:47 AM

## 2012-10-31 NOTE — Progress Notes (Addendum)
                    [address]            [place] [address]          [phone]     4 Days Post-Op Procedure(s) (LRB): VIDEO ASSISTED THORACOSCOPY (VATS),RGHT UPPER LOBE LUNG WEDGE RESECTION (Right) LOBECTOMY (Right)  Subjective: Feels better today. Was able to void finally, nausea resolved and eating some.  Went into rapid AF yesterday, asymptomatic.  This am, alternating between SR and rate controlled AF.   Objective: Vital signs in last 24 hours: Patient Vitals for the past 24 hrs:  BP Temp Temp src Pulse Resp SpO2  10/31/12 0400 113/58 mmHg 98.5 F (36.9 C) Oral 82 12 95 %  10/30/12 2336 124/61 mmHg 97.8 F (36.6 C) Oral 95 14 96 %  10/30/12 2000 139/62 mmHg 98.7 F (37.1 C) Oral 100 - 97 %  10/30/12 1532 109/52 mmHg 98.5 F (36.9 C) Oral 96 17 97 %  10/30/12 1204 - 99.2 F (37.3 C) Oral - - -  10/30/12 1150 161/69 mmHg - - 98 22 94 %  10/30/12 0758 137/60 mmHg 100 F (37.8 C) Oral 91 21 96 %   Current Weight  10/27/12 165 lb (74.844 kg)     Intake/Output from previous day: 03/29 0701 - 03/30 0700 In: 2409.6 [P.O.:1080; I.V.:1329.6] Out: 2180 [Urine:1750; Chest Tube:430]    PHYSICAL EXAM:  Heart: Irr irr, rates in 80s Lungs: Exp wheezes throughout, but overall clearer today Wound: Clean and dry Chest tube: +1-2/7 air leak with cough    Lab Results: CBC: Recent Labs  10/29/12 0418 10/31/12 0500  WBC 19.3* 11.2*  HGB 12.4 10.2*  HCT 36.8 29.7*  PLT 258 217   BMET:  Recent Labs  10/30/12 0429 10/31/12 0500  NA 132* 136  K 4.2 3.8  CL 99 99  CO2 28 30  GLUCOSE 165* 159*  BUN 10 10  CREATININE 0.51 0.44*  CALCIUM 8.5 8.4    PT/INR: No results found for this basename: LABPROT, INR,  in the last 72 hours    Assessment/Plan: S/P Procedure(s) (LRB): VIDEO ASSISTED THORACOSCOPY (VATS),RGHT UPPER LOBE LUNG WEDGE RESECTION (Right) LOBECTOMY (Right)  CT still with small air leak and increased serous drainage.  CXR not  done yet this am.  Will continue CT to suction for now and follow up CXR.  CV- rate controlled AF this am.  Continue current meds.  Appreciate cardiology's assistance.  GU- voiding without issue now.  Leukocytosis- likely atelectasis.  UA negative.  Continue pulm toilet and watch.  Mobilize as tolerated. PT ordered.   LOS: 4 days    Hodges,Nancy H 10/31/2012  Still in afib Still with air leak and chest tube in place On low dose Lovenox for dvt Will wait on full dose of anticoagulation I have seen and examined Nancy Hodges and agree with the above assessment  and plan.  Delight Ovens MD [facility] [phone] Office [phone] 10/31/2012 12:21 PM

## 2012-10-31 NOTE — Progress Notes (Addendum)
Subjective:  Still in atrial fib this am with ventricular rate 90-100. No chest pain or dyspnea.  Objective:  Vital Signs in the last 24 hours: Temp:  [97.8 F (36.6 C)-99.2 F (37.3 C)] 98.5 F (36.9 C) (03/30 0700) Pulse Rate:  [82-100] 85 (03/30 0734) Resp:  [12-17] 15 (03/30 0734) BP: (109-139)/(52-62) 125/58 mmHg (03/30 0734) SpO2:  [95 %-97 %] 97 % (03/30 0734)  Intake/Output from previous day: 03/29 0701 - 03/30 0700 In: 2409.6 [P.O.:1080; I.V.:1329.6] Out: 2180 [Urine:1750; Chest Tube:430] Intake/Output from this shift: Total I/O In: 20 [I.V.:20] Out: 60 [Chest Tube:60]  . bisacodyl  10 mg Oral Daily  . diltiazem  240 mg Oral Daily  . enoxaparin  40 mg Subcutaneous Q24H  . loratadine  10 mg Oral Daily  . pantoprazole  40 mg Oral Q1200   . dextrose 5 % and 0.9% NaCl 20 mL/hr at 10/31/12 2440    Physical Exam: The patient appears to be in no distress.  Head and neck exam reveals that the pupils are equal and reactive.  The extraocular movements are full.  There is no scleral icterus.  Mouth and pharynx are benign.  No lymphadenopathy.  No carotid bruits.  The jugular venous pressure is normal.  Thyroid is not enlarged or tender.  Chest: Decreased breath sounds right base.  Heart reveals no abnormal lift or heave.  First and second heart sounds are normal.  There is no murmur gallop rub or click.  The abdomen is soft and nontender.  Bowel sounds are normoactive.  There is no hepatosplenomegaly or mass.  There are no abdominal bruits.  Extremities reveal no phlebitis or edema.  Pedal pulses are good.  There is no cyanosis or clubbing.  Neurologic exam is normal strength and no lateralizing weakness.  No sensory deficits.  Integument reveals no rash  Lab Results:  Recent Labs  10/29/12 0418 10/31/12 0500  WBC 19.3* 11.2*  HGB 12.4 10.2*  PLT 258 217    Recent Labs  10/30/12 0429 10/31/12 0500  NA 132* 136  K 4.2 3.8  CL 99 99  CO2 28 30    GLUCOSE 165* 159*  BUN 10 10  CREATININE 0.51 0.44*   No results found for this basename: TROPONINI, CK, MB,  in the last 72 hours Hepatic Function Panel  Recent Labs  10/29/12 0418  PROT 6.7  ALBUMIN 3.0*  AST 30  ALT 37*  ALKPHOS 84  BILITOT 0.9   No results found for this basename: CHOL,  in the last 72 hours No results found for this basename: PROTIME,  in the last 72 hours  Imaging: Dg Chest Port 1 View  10/31/2012  *RADIOLOGY REPORT*  Clinical Data: Chest congestion and fever.  Right chest tubes.  PORTABLE CHEST - 1 VIEW  Comparison: Yesterday.  Findings: Stable right jugular catheter and to the right chest tubes.  No significant change in a right lateral subcutaneous emphysema.  No pneumothorax seen.  Mildly improved aeration bilaterally.  The cardiac silhouette remains borderline enlarged.  IMPRESSION:  1.  Mildly improved bilateral pneumonia or atelectasis. 2.  No pneumothorax.   Original Report Authenticated By: Beckie Salts, M.D.    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  Chest tubes in place.  PORTABLE CHEST - 1 VIEW  Comparison:  10/29/2012  Findings: Support apparatus stable with right IJ line tip in the lower SVC, with two right-sided chest tubes with similar orientation and position to yesterday's exam.  Volume loss of the right chest noted with indistinct opacities at the right lung base and right mid lung.  Cardiac and mediastinal contour differences from prior exam are due to mild leftward rotation on today's exam and rightward rotation on the prior exam.  There is minimal subsegmental atelectasis at the left lung base. No pneumothorax is identified.  Heart size within normal limits.  IMPRESSION:  1.  Vague opacity in the right midlung and right lung base favors atelectasis over pneumonia. 2.   Subsegmental atelectasis along the left hemidiaphragm. 3.  Stable support apparatus with no pneumothorax observed.   Original Report Authenticated By: Gaylyn Rong, M.D.     Cardiac Studies:  Assessment/Plan:  1. recurrent atrial fibrillation postoperatively in a patient with known paroxysmal atrial fibrillation and tachybradycardia syndrome. 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  2. S/P recent RUL lung wedge resection. Chest tube still in place.  Plan: Continue oral diltiazem.  Add IV amiodarone drip.           Advance to full anticoagulation for atrial fib when okay with surgeons.   LOS: 4 days    Cassell Clement 10/31/2012, 12:03 PM

## 2012-11-01 ENCOUNTER — Inpatient Hospital Stay (HOSPITAL_COMMUNITY): Payer: Medicare Other

## 2012-11-01 DIAGNOSIS — R222 Localized swelling, mass and lump, trunk: Secondary | ICD-10-CM

## 2012-11-01 MED ORDER — RIVAROXABAN 20 MG PO TABS
20.0000 mg | ORAL_TABLET | Freq: Every day | ORAL | Status: DC
  Administered 2012-11-01 – 2012-11-04 (×4): 20 mg via ORAL
  Filled 2012-11-01 (×5): qty 1

## 2012-11-01 NOTE — Progress Notes (Signed)
Pharmacy note: Xarelto  67 yo female to restart Xarelto ( on PTA for afib) s/p VATs and noted on enoxaparin for VTE prophylaxis  -No VTE prophylaxis is needed as patient is to start Kerin Salen -Will discontinue enoxaparin  Please call pharmacy with any concerns.  Thank you, Harland German, Pharm D 11/01/2012 8:21 AM

## 2012-11-01 NOTE — Progress Notes (Signed)
Patient: Nancy Hodges Date of Encounter: 11/01/2012, 8:49 AM Admit date: 10/27/2012     Subjective  Ms. Castanon reports persistent sharp CP, worse with inspiration accompanied by SOB, ongoing since surgery. She also reports intermittent tachypalpitations but none currently.    Objective  Physical Exam: Vitals: BP 115/58  Pulse 96  Temp(Src) 97.5 F (36.4 C) (Oral)  Resp 19  Ht 5\' 2"  (1.575 m)  Wt 165 lb (74.844 kg)  BMI 30.17 kg/m2  SpO2 94% General: Well developed, ill appearing 67 year old female in no acute distress. Neck: Supple. No JVD. Lungs: Rales on right otherwise clear to auscultation. No wheezes or rhonchi. Breathing is unlabored. Right chest tube noted. Heart: Irregularly irregular S1 S2 without murmur, rub or gallop.  Abdomen: Soft, non-distended. Extremities: No clubbing or cyanosis. No edema.   Neuro: Alert and oriented X 3. Moves all extremities spontaneously. No focal deficits.  Intake/Output:  Intake/Output Summary (Last 24 hours) at 11/01/12 0849 Last data filed at 11/01/12 0800  Gross per 24 hour  Intake 2441.94 ml  Output   1750 ml  Net 691.94 ml    Inpatient Medications:  . bisacodyl  10 mg Oral Daily  . diltiazem  240 mg Oral Daily  . loratadine  10 mg Oral Daily  . pantoprazole  40 mg Oral Q1200  . Rivaroxaban  20 mg Oral Q supper   . amiodarone (NEXTERONE PREMIX) 360 mg/200 mL dextrose 30 mg/hr (11/01/12 0800)  . dextrose 5 % and 0.9% NaCl 20 mL/hr at 10/31/12 1900    Labs:  Recent Labs  10/30/12 0429 10/31/12 0500  NA 132* 136  K 4.2 3.8  CL 99 99  CO2 28 30  GLUCOSE 165* 159*  BUN 10 10  CREATININE 0.51 0.44*  CALCIUM 8.5 8.4    Recent Labs  10/31/12 0500  WBC 11.2*  HGB 10.2*  HCT 29.7*  MCV 94.6  PLT 217    Radiology/Studies: Dg Chest Port 1 View  11/01/2012  *RADIOLOGY REPORT*  Clinical Data: Lung cancer, right upper lobectomy.  PORTABLE CHEST - 1 VIEW  Comparison: 10/31/2012.  Findings: Trachea is  midline.  Heart size stable.  Right IJ central line tip projects over the SVC.  Two right chest tubes remain in place.  There are postoperative changes and volume loss in the right hemithorax with stable pleural parenchymal opacification.  No definite pneumothorax.  Minimal left basilar atelectasis.  Small amount of subcutaneous emphysema along the lower right lateral chest wall.  IMPRESSION:  1.  Postoperative changes and volume loss in the right hemithorax with stable pleural parenchymal opacification. 2.  No pneumothorax with two right chest tubes in place. 3.  Mild left basilar atelectasis.   Original Report Authenticated By: Leanna Battles, M.D.    Dg Chest Port 1 View  10/31/2012  *RADIOLOGY REPORT*  Clinical Data: Chest congestion and fever.  Right chest tubes.  PORTABLE CHEST - 1 VIEW  Comparison: Yesterday.  Findings: Stable right jugular catheter and to the right chest tubes.  No significant change in a right lateral subcutaneous emphysema.  No pneumothorax seen.  Mildly improved aeration bilaterally.  The cardiac silhouette remains borderline enlarged.  IMPRESSION:  1.  Mildly improved bilateral pneumonia or atelectasis. 2.  No pneumothorax.   Original Report Authenticated By: Beckie Salts, M.D.     Telemetry: currently AF in the 90s    Assessment and Plan  1. Recurrent atrial fibrillation - postoperatively; with known PAF and  tachy-brady syndrome; has a leadless ventricular pacemaker ([employer] research) in place; will notify research; continue PO diltiazem and consider transitioning to PO amiodarone; Xarelto restarted yesterday 2. s/p recent RUL lung wedge resection - chest tube still in place; per TCTS   Dr. Graciela Husbands to see Signed, EDMISTEN, BROOKE PA-C PAF  As above, now in sinus  Continue amio IV as still naueseated;  Will change to po in am  Rivaroxaban written to be resumed today

## 2012-11-01 NOTE — Progress Notes (Signed)
Physical Therapy Treatment Patient Details Name: Nancy Hodges MRN: [id number] DOB: 15-Nov-1945 Today's Date: 11/01/2012 Time: 4098-1191 PT Time Calculation (min): 27 min  PT Assessment / Plan / Recommendation Comments on Treatment Session  67 y.o. female admitted to [facility] for R UL lung resection with post-op chest tube and A-fib.  She presents today moving better and was able to tolerate increased gait distance with RW.  She reports she will be staying with a friend who can provide 24 hour care at discharge.  She will need a RW and a 3-in-1 BSC as well as HHPT at discharge.      Follow Up Recommendations  Home health PT     Does the patient have the potential to tolerate intense rehabilitation    NA  Barriers to Discharge   none      Equipment Recommendations  Rolling walker with 5" wheels (3-in-1 Bedside commode)    Recommendations for Other Services   none  Frequency Min 3X/week   Plan Discharge plan remains appropriate;Frequency remains appropriate    Precautions / Restrictions Precautions Precaution Comments: chest tube right side Restrictions Weight Bearing Restrictions: No   Pertinent Vitals/Pain O2 sats 92% on RA with gait, DOE 2/4, pain 3/10 at chest tube side right flank.    Mobility  Bed Mobility Bed Mobility: Sit to Supine Sit to Supine: 3: Mod assist;HOB elevated;With rail Details for Bed Mobility Assistance: mod assist to help with bil legs back into bed.  Transfers Transfers: Sit to Stand;Stand to Sit Sit to Stand: 4: Min assist;With upper extremity assist;From chair/3-in-1;With armrests Stand to Sit: 4: Min assist;With armrests;To bed;To chair/3-in-1 Details for Transfer Assistance: min assist to support trunk over weak legs to get up onto feet.   Ambulation/Gait Ambulation/Gait Assistance: 4: Min assist Ambulation Distance (Feet): 55 Feet Assistive device: Rolling walker Ambulation/Gait Assistance Details: min assist to support trunk over weak legs.  Pt  with slow, short, shuffle steps.  Even with cueing she was not able to increase stride length.  O2 sats 92% on RA with gait and DOE 2/4.   Gait Pattern: Step-through pattern;Shuffle;Decreased stride length;Trunk flexed Gait velocity: much less than 1.8 ft/sec putting her at high risk for recurrent falls    Exercises General Exercises - Lower Extremity Long Arc Quad: AROM;Both;10 reps;Seated Hip Flexion/Marching: AROM;Both;10 reps;Seated Toe Raises: AROM;Both;10 reps;Seated Heel Raises: AROM;Both;10 reps;Seated    PT Goals Acute Rehab PT Goals PT Goal: Sit to Supine/Side - Progress: Progressing toward goal PT Goal: Sit to Stand - Progress: Progressing toward goal PT Goal: Stand to Sit - Progress: Progressing toward goal PT Goal: Ambulate - Progress: Progressing toward goal  Visit Information  Last PT Received On: 11/01/12 Assistance Needed: +1    Subjective Data  Subjective: Pt reports she is feeling better every day.   Patient Stated Goal: return home, she is going to stay with a friend.     Cognition  Cognition Overall Cognitive Status: Appears within functional limits for tasks assessed/performed Arousal/Alertness: Awake/alert Orientation Level: Appears intact for tasks assessed Behavior During Session: [facility] for tasks performed Cognition - Other Comments: Not specifically tested, conversation normal       End of Session PT - End of Session Activity Tolerance: Patient limited by fatigue;Patient limited by pain Patient left: in bed;with call bell/phone within reach Nurse Communication: Mobility status    Lurena Joiner B. Johnell Bas, PT, DPT [phone]   11/01/2012, 9:49 AM

## 2012-11-01 NOTE — Progress Notes (Signed)
Utilization review completed.  

## 2012-11-01 NOTE — Progress Notes (Signed)
5 Days Post-Op Procedure(s) (LRB): VIDEO ASSISTED THORACOSCOPY (VATS),RGHT UPPER LOBE LUNG WEDGE RESECTION (Right) LOBECTOMY (Right) Subjective: Some pain from tubes   Objective: Vital signs in last 24 hours: Temp:  [97.5 F (36.4 C)-98.8 F (37.1 C)] 97.5 F (36.4 C) (03/31 0755) Pulse Rate:  [73-96] 96 (03/31 0755) Cardiac Rhythm:  [-] Atrial fibrillation (03/31 0755) Resp:  [12-23] 19 (03/31 0755) BP: (97-141)/(50-66) 115/58 mmHg (03/31 0755) SpO2:  [94 %-100 %] 94 % (03/31 0755)  Hemodynamic parameters for last 24 hours:    Intake/Output from previous day: 03/30 0701 - 03/31 0700 In: 2368.5 [P.O.:1560; I.V.:808.5] Out: 1690 [Urine:1400; Chest Tube:290] Intake/Output this shift:    General appearance: alert and no distress Heart: irregularly irregular rhythm Lungs: diminished breath sounds bibasilar Wound: clean and dry small air leak, serous drainage  Lab Results:  Recent Labs  10/31/12 0500  WBC 11.2*  HGB 10.2*  HCT 29.7*  PLT 217   BMET:  Recent Labs  10/30/12 0429 10/31/12 0500  NA 132* 136  K 4.2 3.8  CL 99 99  CO2 28 30  GLUCOSE 165* 159*  BUN 10 10  CREATININE 0.51 0.44*  CALCIUM 8.5 8.4    PT/INR: No results found for this basename: LABPROT, INR,  in the last 72 hours ABG    Component Value Date/Time   PHART 7.337* 10/28/2012 0251   HCO3 23.8 10/28/2012 0251   TCO2 25.2 10/28/2012 0251   ACIDBASEDEF 1.2 10/28/2012 0251   O2SAT 98.8 10/28/2012 0251   CBG (last 3)  No results found for this basename: GLUCAP,  in the last 72 hours  Assessment/Plan: S/P Procedure(s) (LRB): VIDEO ASSISTED THORACOSCOPY (VATS),RGHT UPPER LOBE LUNG WEDGE RESECTION (Right) LOBECTOMY (Right) atrial fibrillation- was on xarelto PTA- will resume  Continue lovenox another 24 hours Drainage decreasing- dc posterior CT Will keep anterior CT to water seal - still has a small air leak Ambulate PATH- T2aN0- stage IB- patient informed   LOS: 5 days     Traven Davids C 11/01/2012

## 2012-11-02 ENCOUNTER — Inpatient Hospital Stay (HOSPITAL_COMMUNITY): Payer: Medicare Other

## 2012-11-02 DIAGNOSIS — I495 Sick sinus syndrome: Secondary | ICD-10-CM

## 2012-11-02 LAB — BASIC METABOLIC PANEL
BUN: 8 mg/dL (ref 6–23)
Creatinine, Ser: 0.5 mg/dL (ref 0.50–1.10)
GFR calc Af Amer: >90 mL/min (ref >90)
GFR calc non Af Amer: >90 mL/min (ref >90)
Glucose, Bld: 135 mg/dL — ABNORMAL HIGH (ref 70–99)

## 2012-11-02 LAB — CBC
HCT: 30.3 % — ABNORMAL LOW (ref 36.0–46.0)
Hemoglobin: 10.6 g/dL — ABNORMAL LOW (ref 12.0–15.0)
MCH: 32.1 pg (ref 26.0–34.0)
MCHC: 35 g/dL (ref 30.0–36.0)
MCV: 91.8 fL (ref 78.0–100.0)
RDW: 12.9 % (ref 11.5–15.5)

## 2012-11-02 MED ORDER — POTASSIUM CHLORIDE 10 MEQ/50ML IV SOLN
10.0000 meq | INTRAVENOUS | Status: AC
  Administered 2012-11-02 (×4): 10 meq via INTRAVENOUS

## 2012-11-02 MED ORDER — AMIODARONE HCL 200 MG PO TABS
200.0000 mg | ORAL_TABLET | Freq: Two times a day (BID) | ORAL | Status: DC
  Administered 2012-11-02 – 2012-11-05 (×7): 200 mg via ORAL
  Filled 2012-11-02 (×8): qty 1

## 2012-11-02 NOTE — Progress Notes (Signed)
   SUBJECTIVE: The patient is doing well today. She has anticipated post op pain.  Nausea is resolved.  At this time, she denies shortness of breath, or any new concerns.  [name] amiodarone  200 mg Oral BID  . bisacodyl  10 mg Oral Daily  . diltiazem  240 mg Oral Daily  . loratadine  10 mg Oral Daily  . pantoprazole  40 mg Oral Q1200  . potassium chloride  10 mEq Intravenous Q1 Hr x 4  . Rivaroxaban  20 mg Oral Q supper   . dextrose 5 % and 0.9% NaCl 20 mL/hr at 11/01/12 2100    OBJECTIVE: Physical Exam: Filed Vitals:   11/01/12 2015 11/02/12 0050 11/02/12 0320 11/02/12 0729  BP: 151/67 138/61 147/62   Pulse: 83 81 82   Temp: 98.7 F (37.1 C) 98.4 F (36.9 C) 97.8 F (36.6 C) 98.1 F (36.7 C)  TempSrc: Oral Oral Oral Oral  Resp: 20 19 19    Height:      Weight:      SpO2: 98% 98% 99%     Intake/Output Summary (Last 24 hours) at 11/02/12 0914 Last data filed at 11/02/12 0729  Gross per 24 hour  Intake  770.7 ml  Output   1830 ml  Net -1059.3 ml    Telemetry reveals sinus rhythm,  Afib noted yesterday with mostly controlled V rates.  Demand V pacing  GEN- The patient is well appearing, alert and oriented x 3 today.   Head- normocephalic, atraumatic Eyes-  Sclera clear, conjunctiva pink Ears- hearing intact Oropharynx- clear Neck- supple, no JVP Lymph- no cervical lymphadenopathy Lungs- Clear to ausculation bilaterally, normal work of breathing Heart- Regular rate and rhythm, no murmurs, rubs or gallops, PMI not laterally displaced GI- soft, NT, ND, + BS Extremities- no clubbing, cyanosis, or edema  LABS: Basic Metabolic Panel:  Recent Labs  29/56/21 0500 11/02/12 0435  NA 136 139  K 3.8 3.6  CL 99 99  CO2 30 34*  GLUCOSE 159* 135*  BUN 10 8  CREATININE 0.44* 0.50  CALCIUM 8.4 8.6   Liver Function Tests: No results found for this basename: AST, ALT, ALKPHOS, BILITOT, PROT, ALBUMIN,  in the last 72 hours No results found for this basename: LIPASE,  AMYLASE,  in the last 72 hours CBC:  Recent Labs  10/31/12 0500 11/02/12 0435  WBC 11.2* 7.8  HGB 10.2* 10.6*  HCT 29.7* 30.3*  MCV 94.6 91.8  PLT 217 249   Cardiac Enzymes: No results found for this basename: CKTOTAL, CKMB, CKMBINDEX, TROPONINI,  in the last 72 hours BNP: No components found with this basename: POCBNP,  D-Dimer: No results found for this basename: DDIMER,  in the last 72 hours Hemoglobin A1C: No results found for this basename: HGBA1C,  in the last 72 hours Fasting Lipid Panel: No results found for this basename: CHOL, HDL, LDLCALC, TRIG, CHOLHDL, LDLDIRECT,  in the last 72 hours Thyroid Function Tests:  Recent Labs  11/01/12 0510  TSH 1.790   Anemia Panel: No results found for this basename: VITAMINB12, FOLATE, FERRITIN, TIBC, IRON, RETICCTPCT,  in the last 72 hours   ASSESSMENT AND PLAN:  1. Afib- improved with Amidorone Will convert amiodarone to PO today Continue xarelto  2. Tachy/brady Normal pacemaker function  No new CV recs Will see as needed Call with questions.  Hillis Range, MD 11/02/2012 9:14 AM

## 2012-11-02 NOTE — Progress Notes (Signed)
Discoloration noted in distal lumen of central line. IV team paged to look at lumen, no concern noted. Will continue to monitor.  [place], [place], Charity fundraiser

## 2012-11-02 NOTE — Progress Notes (Signed)
6 Days Post-Op Procedure(s) (LRB): VIDEO ASSISTED THORACOSCOPY (VATS),RGHT UPPER LOBE LUNG WEDGE RESECTION (Right) LOBECTOMY (Right) Subjective: C/o pain from CT Appetite poor No nausea this AM Ambulated this AM  Objective: Vital signs in last 24 hours: Temp:  [97.5 F (36.4 C)-98.7 F (37.1 C)] 98.1 F (36.7 C) (04/01 0729) Pulse Rate:  [72-96] 82 (04/01 0320) Cardiac Rhythm:  [-] Normal sinus rhythm (04/01 0320) Resp:  [14-27] 19 (04/01 0320) BP: (115-161)/(52-76) 147/62 mmHg (04/01 0320) SpO2:  [94 %-99 %] 99 % (04/01 0320)  Hemodynamic parameters for last 24 hours:    Intake/Output from previous day: 03/31 0701 - 04/01 0700 In: 844.1 [I.V.:844.1] Out: 1950 [Urine:1650; Chest Tube:300] Intake/Output this shift:    General appearance: alert and no distress Heart: regular rate and rhythm Lungs: diminished breath sounds right base Wound: clean and dry CT with serous fluid,  minimal air leak  Lab Results:  Recent Labs  10/31/12 0500 11/02/12 0435  WBC 11.2* 7.8  HGB 10.2* 10.6*  HCT 29.7* 30.3*  PLT 217 249   BMET:  Recent Labs  10/31/12 0500 11/02/12 0435  NA 136 139  K 3.8 3.6  CL 99 99  CO2 30 34*  GLUCOSE 159* 135*  BUN 10 8  CREATININE 0.44* 0.50  CALCIUM 8.4 8.6    PT/INR: No results found for this basename: LABPROT, INR,  in the last 72 hours ABG    Component Value Date/Time   PHART 7.337* 10/28/2012 0251   HCO3 23.8 10/28/2012 0251   TCO2 25.2 10/28/2012 0251   ACIDBASEDEF 1.2 10/28/2012 0251   O2SAT 98.8 10/28/2012 0251   CBG (last 3)  No results found for this basename: GLUCAP,  in the last 72 hours  Assessment/Plan: S/P Procedure(s) (LRB): VIDEO ASSISTED THORACOSCOPY (VATS),RGHT UPPER LOBE LUNG WEDGE RESECTION (Right) LOBECTOMY (Right) - CV- in SR, tachy-brady, atrial fib, pacemaker- on IV amiodarone- plan per Cardiology  On Xarelto  RESP- s/p RUL- CXR looks good, has a very minimal air leak- keep tube today  RENAL- supplement  K  DC central line if peripheral can be obtained  Continue ambulation   LOS: 6 days    Joe Tanney C 11/02/2012

## 2012-11-03 ENCOUNTER — Inpatient Hospital Stay (HOSPITAL_COMMUNITY): Payer: Medicare Other

## 2012-11-03 LAB — BASIC METABOLIC PANEL
BUN: 7 mg/dL (ref 6–23)
CO2: 31 mEq/L (ref 19–32)
Glucose, Bld: 116 mg/dL — ABNORMAL HIGH (ref 70–99)
Potassium: 3.8 mEq/L (ref 3.5–5.1)
Sodium: 136 mEq/L (ref 135–145)

## 2012-11-03 LAB — GLUCOSE, CAPILLARY

## 2012-11-03 MED ORDER — POTASSIUM CHLORIDE CRYS ER 20 MEQ PO TBCR
20.0000 meq | EXTENDED_RELEASE_TABLET | Freq: Two times a day (BID) | ORAL | Status: AC
  Administered 2012-11-03 (×2): 20 meq via ORAL
  Filled 2012-11-03 (×2): qty 1

## 2012-11-03 MED ORDER — ACETAMINOPHEN 325 MG PO TABS
650.0000 mg | ORAL_TABLET | ORAL | Status: DC | PRN
  Administered 2012-11-03 (×2): 650 mg via ORAL
  Filled 2012-11-03 (×2): qty 2

## 2012-11-03 NOTE — Discharge Summary (Signed)
[address]            Nancy Hodges [id number]          [phone]         Discharge Summary  Name: Nancy Hodges DOB: 12/09/1945 67 y.o. MRN: [id number]   Admission Date: 10/27/2012 Discharge Date:     Admitting Diagnosis: Right lung mass   Discharge Diagnosis:  Invasive moderately differentiated adenocarcinoma (T2a, N0) Postoperative hyponatremia, resolved   Past Medical History  Diagnosis Date  . Asthma   . History of sinusitis   . Allergic rhinitis   . History of kidney stones   . 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 H/O hiatal hernia   . GERD (gastroesophageal reflux disease)   . Pulmonary nodule   . Paroxysmal atrial fibrillation     CHADS score: 0  . Atrial flutter   . Tachycardia-bradycardia syndrome   . Anxiety   . Headache     sinus    Procedures: RIGHT VIDEO ASSISTED THORACOSCOPY, RIGHT UPPER LOBECTOMY, LYMPH NODE DISSECTION on 10/27/2012   HPI:  The patient is a 67 y.o. female with a past history of heavy tobacco abuse who recently was being treated for atrial fibrillation and tachybradycardia syndrome. During her evaluation for pacemaker placement she was found to have a right lung nodule on chest x-ray. She then had a CT of the chest which showed a 1.8 cm mass in the right upper lobe, there was no mediastinal or hilar adenopathy. A PET/CT was performed which showed the lesion to be hypermetabolic. There is no evidence of regional or distant metastases.She denies any exertional chest pain, shortness of breath, cough or hemoptysis.  She was referred to Dr. Dorris Fetch for thoracic surgery evaluation.  He felt that the lesion was likely a bronchogenic carcinoma, which would require VATS/wedge resection/possible lobectomy for diagnosis and treatment.  All risks, benefits and alternatives of surgery were explained in detail, and the patient agreed to proceed.    Hospital Course:  The patient was  admitted to [facility] on 10/27/2012. The patient was taken to the operating room and underwent the above procedure.    The postoperative course has generally been uneventful.  She had some initial sedation and nausea thought to be related to narcotics.  These were minimized, and her symptoms improved.  She had an episode of rapid atrial fibrillation on postop day 3, and was seen by cardiology. She was treated with extra Diltiazem and IV Amiodarone with improvement in her rates.  She has been intermittently in sinus rhythm and rate controlled a-fib, and has been switched to po Amiodarone.  She was also restarted on Xarelto.  She has also had a persistent small air leak from her chest tube. Her chest x-rays have remained stable, and we plan to clamp her chest tube today.  If her x-ray remains stable, her tube will be removed.  She is ambulating in the halls and tolerating a regular diet. Her pathology showed a Stage 1B adenocarcinoma.   We anticipate discharge in 24-48 hours provided she remains stable following chest tube removal.    Recent vital signs:  Filed Vitals:   11/05/12 0732  BP:   Pulse:   Temp: 98.5 F (36.9 C)  Resp:     Recent laboratory studies:  CBC:No results found for this basename: WBC, HGB, HCT, PLT,  in  the last 72 hours BMET:   Recent Labs  11/03/12 0405  NA 136  K 3.8  CL 98  CO2 31  GLUCOSE 116*  BUN 7  CREATININE 0.49*  CALCIUM 8.9    PT/INR: No results found for this basename: LABPROT, INR,  in the last 72 hours   Discharge Medications:     Medication List    STOP taking these medications       doxycycline 100 MG capsule  Commonly known as:  MONODOX      TAKE these medications       acetaminophen 500 MG tablet  Commonly known as:  TYLENOL  Take 500 mg by mouth every 6 (six) hours as needed for pain.     amiodarone 200 MG tablet  Commonly known as:  PACERONE  Take 1 tablet (200 mg total) by mouth 2 (two) times daily.     diltiazem 240 MG  24 hr capsule  Commonly known as:  CARDIZEM CD  Take 240 mg by mouth daily.     fexofenadine 180 MG tablet  Commonly known as:  ALLEGRA  Take 180 mg by mouth daily.     fluticasone 50 MCG/ACT nasal spray  Commonly known as:  FLONASE  Place 2 sprays into the nose daily as needed (for congestion).     multivitamin with minerals Tabs  Take 1 tablet by mouth daily.     Rivaroxaban 20 MG Tabs  Commonly known as:  XARELTO  Take 20 mg by mouth daily.     traMADol 50 MG tablet  Commonly known as:  ULTRAM  Take 1 tablet (50 mg total) by mouth every 6 (six) hours as needed.         Discharge Instructions:  The patient is to refrain from driving, heavy lifting or strenuous activity.  May shower daily and clean incisions with soap and water.  May resume regular diet.   Follow Up: Follow-up Information   Follow up with HENDRICKSON,STEVEN C, MD In 3 weeks. (Office will call to schedule appointment)    Contact information:   [address] [employer] [address] [place] [address] [phone]       Follow up with [facility] NUR[facility] In 1 week. (Dr. Sunday Corn nurse will remove sutures in 1 week- office will call with appointment)       Follow up with Hillis Range, MD. (Follow up as directed)    Contact information:   [address] [place] [address] [phone]       Follow-up Information   Follow up with HENDRICKSON,STEVEN C, MD In 3 weeks. (Office will call to schedule appointment)    Contact information:   [address] [employer] [address] [place] [address] [phone]       Follow up with [facility]UR[facility] In 1 week. (Dr. Sunday Corn nurse will remove sutures in 1 week- office will call with appointment)       Follow up with Hillis Range, MD. (Follow up as directed)    Contact information:   [address], [address] [place] [address] [phone]        [name] E 11/05/2012, 8:56 AM    Addendum:  The chest tube has been removed and  chest x-ray is stable with a small right apical pneumothorax. She remains clinically stable and is felt to be adequately recovered for discharge on today's date.

## 2012-11-03 NOTE — Progress Notes (Signed)
  MEDICARE-CERTIFIED HOME HEALTH AGENCIES [place]  Agencies that are Medicare-Certified and affiliated with [facility]   Home Health Agency  Telephone Number Address  [facility]  The [facility] has ownership interest in this company; however, you are under no obligation to use this agency. [phone] or [phone] [address] [place], [place]  [address] [email or web address]  Agencies that are Medicare-Certified and are not affiliated with [facility] Agency  Telephone Number Address  [facility] [phone] Fax [phone] [address] [place], [place]  [address] [email or web address]  [facility]  Agency [phone]. [phone] Fax [phone] [address] [place], [place]  [address] [email or web address]  [facility]  [phone] Fax [phone] [address]  [place], [place]  [address] [email or web address]  [facility] [phone] or [phone] [address] [place], [place] [address]  [facility] [phone]  [address] [place], [place] [address] [email or web address]  [facility] [phone] Fax [phone] [address] [place], [place]  [address] [email or web address]  [facility] [phone] Fax [phone] [address] [place], [place]  [address]         Agencies that are NOT Medicare-Certified and are not affiliated with The [facility] Agency  Telephone Number Address  [facility] [phone] Fax [phone] [address] [place], [place]  [address] [email or web address]  [facility]  [phone] [address] [place], [place] [email or web address]  [employer] [phone] Fax  [phone] [address], [place] [address]  [facility] [phone] [address] [place], [place] [address] [email or web address]  [facility]  [phone] Fax [phone] [address] [place], [place]  [address] [email or web address]  [facility] [phone] [address] [place], [place]  [address] [email or web address]      [facility] Agency  Telephone Number  [facility] [phone] Fax [phone] [email or web address]  [facility] [phone] Fax [phone] [email or web address]  [facility] [phone] Fax [phone] [email or web address]   [facility] [phone] Fax [phone]  [facility] [phone] Fax [phone]  [facility] [phone] Fax [phone]  Home Health Agency  Telephone Number  [facility] [phone] Fax [phone]  [facility] [phone] Fax [phone]

## 2012-11-03 NOTE — Care Management Note (Signed)
    Page 1 of 2   11/05/2012     11:40:22 AM   CARE MANAGEMENT NOTE 11/05/2012  Patient:  Nancy Hodges, Nancy Hodges   Account Number:  [id number]  Date Initiated:  11/01/2012  Documentation initiated by:  Donn Pierini  Subjective/Objective Assessment:   Pt admitted s/p VATS with wedge resection     Action/Plan:   PTA pt lived at home - NCM to follow pt progression for d/c needs   Anticipated DC Date:  11/05/2012   Anticipated DC Plan:  HOME W HOME HEALTH SERVICES      DC Planning Services  CM consult      [facility] Choice  HOME HEALTH  DURABLE MEDICAL EQUIPMENT   Choice offered to / List presented to:  C-1 Patient   DME arranged  3-N-1  Levan Hurst      DME agency  [facility]     HH arranged  HH-2 PT      HH agency  [facility]  [facility]   Status of service:  Completed, signed off Medicare Important Message given?   (If response is "NO", the following Medicare IM given date fields will be blank) Date Medicare IM given:   Date Additional Medicare IM given:    Discharge Disposition:  HOME W HOME HEALTH SERVICES  Per UR Regulation:  Reviewed for med. necessity/level of care/duration of stay  If discussed at Long Length of Stay Meetings, dates discussed:   11/02/2012  11/04/2012    Comments:  11/05/12- 1030- Donn Pierini RN, BSN [phone] Pt for d/c today, [facility] to deliver DME- 3n1, RW to room prior to discharge- have HH order for PT- call made to [facility] and message left for Jola Babinski in intake/referral- and orders [facility]- spoke with pt regarding d/c plans and per conversation pt still in agreement to previous discussion regarding HH plans-    11/03/12- 1200- Donn Pierini RN, BSN [phone] Spoke with pt regarding d/c plans- per pt the plan is for her to go stay with a friend who lives in [place] for at least a week- then return to her home- pt is agreeable to [facility] services and would  like DME- RW and 3n1 for home. List of both [place] and [facility] agencies given to pt and per pt choice she would like to use [facility] while at her friends and then when she returns home transition to [facility]- spoke with both [facility]) and Lupita Leash with [facility] who state that pt can begin services in [place] and then d/c from [facility] and they can refer to [facility] when she returns home. Will need HH-PT orders and orders for DME-RW and 3n1- when orders received will coordinate [facility] referrals.

## 2012-11-03 NOTE — Progress Notes (Signed)
7 Days Post-Op Procedure(s) (LRB): VIDEO ASSISTED THORACOSCOPY (VATS),RGHT UPPER LOBE LUNG WEDGE RESECTION (Right) LOBECTOMY (Right) Subjective: C/o headache this AM Overall feeling better  Objective: Vital signs in last 24 hours: Temp:  [98.2 F (36.8 C)-98.7 F (37.1 C)] 98.5 F (36.9 C) (04/02 0745) Pulse Rate:  [86-95] 86 (04/02 0744) Cardiac Rhythm:  [-] Atrial fibrillation (04/02 0744) Resp:  [18-25] 18 (04/02 0744) BP: (137-143)/(44-91) 140/63 mmHg (04/02 0744) SpO2:  [93 %-99 %] 93 % (04/02 0744)  Hemodynamic parameters for last 24 hours:    Intake/Output from previous day: 04/01 0701 - 04/02 0700 In: 310.1 [P.O.:120; I.V.:90.1; IV Piggyback:100] Out: 480 [Urine:450; Chest Tube:30] Intake/Output this shift: Total I/O In: 460 [I.V.:460] Out: 160 [Urine:150; Chest Tube:10]  General appearance: alert and no distress Heart: irregularly irregular rhythm Lungs: diminished breath sounds bibasilar minimal intermittent air leak  Lab Results:  Recent Labs  11/02/12 0435  WBC 7.8  HGB 10.6*  HCT 30.3*  PLT 249   BMET:  Recent Labs  11/02/12 0435 11/03/12 0405  NA 139 136  K 3.6 3.8  CL 99 98  CO2 34* 31  GLUCOSE 135* 116*  BUN 8 7  CREATININE 0.50 0.49*  CALCIUM 8.6 8.9    PT/INR: No results found for this basename: LABPROT, INR,  in the last 72 hours ABG    Component Value Date/Time   PHART 7.337* 10/28/2012 0251   HCO3 23.8 10/28/2012 0251   TCO2 25.2 10/28/2012 0251   ACIDBASEDEF 1.2 10/28/2012 0251   O2SAT 98.8 10/28/2012 0251   CBG (last 3)  No results found for this basename: GLUCAP,  in the last 72 hours  Assessment/Plan: S/P Procedure(s) (LRB): VIDEO ASSISTED THORACOSCOPY (VATS),RGHT UPPER LOBE LUNG WEDGE RESECTION (Right) LOBECTOMY (Right) - CV- back in a fib- rate mildly elevated- on rhythm meds and xarelto  RESP- she only occasionally has a bubble come through with coughing- will clamp tube this AM and repeat CXR at noon- dc CT later  today if CXR ok  RENAL- supplement K     LOS: 7 days    Espiridion Supinski C 11/03/2012

## 2012-11-03 NOTE — Progress Notes (Signed)
Has tolerated tube clamping well  BP 124/56  Pulse 82  Temp(Src) 97.6 F (36.4 C) (Oral)  Resp 17  Ht 5\' 2"  (1.575 m)  Wt 165 lb (74.844 kg)  BMI 30.17 kg/m2  SpO2 94%  CXR shows no pneumo  When tube unclamped there was initially a small rush of air through water seal chamber with 1st cough- will leave clamped overnight and dc in AM if she tolerates it

## 2012-11-03 NOTE — Progress Notes (Signed)
Physical Therapy Treatment Patient Details Name: Nancy Hodges MRN: [id number] DOB: 19-Mar-1946 Today's Date: 11/03/2012 Time: 9562-1308 PT Time Calculation (min): 17 min  PT Assessment / Plan / Recommendation Comments on Treatment Session  67 y.o. female admitted to [facility] for R UL lung resection with post-op chest tube and A-fib.  She was able to ambulate further today and with less assistance.  She is slowly recovering making progess every session with therapy.      Follow Up Recommendations  Home health PT     Does the patient have the potential to tolerate intense rehabilitation    NA  Barriers to Discharge   none      Equipment Recommendations  Rolling walker with 5" wheels (3-in-1 BSC)    Recommendations for Other Services   none  Frequency Min 3X/week   Plan Discharge plan remains appropriate;Frequency remains appropriate    Precautions / Restrictions Precautions Precautions: Other (comment) Precaution Comments: chest tube right side Restrictions Weight Bearing Restrictions: No   Pertinent Vitals/Pain See vitals flow sheet   Mobility  Bed Mobility Sit to Supine: 5: Supervision;HOB flat;With rail Details for Bed Mobility Assistance: supervision for safety and line management.  Pt able to get bil legs into the bed today Transfers Sit to Stand: 5: Supervision;With upper extremity assist;From bed Stand to Sit: 5: Supervision;With upper extremity assist;To bed Details for Transfer Assistance: supervision for safety and line management Ambulation/Gait Ambulation/Gait Assistance: 4: Min guard Ambulation Distance (Feet): 75 Feet Assistive device: Rolling walker Ambulation/Gait Assistance Details: min guard assist to support pt for balance during gait.   Gait Pattern: Step-through pattern;Shuffle;Decreased stride length;Trunk flexed Gait velocity: less than 1.8 ft/sec indicating risk for recurrent falls General Gait Details: Increased coughing and increased RR to 30 with  gait.  DOE 3/4.  O2 sats remain stable in the 90s on RA    Exercises Other Exercises Other Exercises: incintive spirometer x 5 reps with verbal cues on correct use.  Max 750 mL.  Pt was not using the IS correctly.     PT Goals Acute Rehab PT Goals Pt will go Sit to Supine/Side: with modified independence;with HOB 0 degrees PT Goal: Sit to Supine/Side - Progress: Updated due to goal met Pt will go Sit to Stand: with modified independence;with upper extremity assist PT Goal: Sit to Stand - Progress: Updated due to goal met Pt will go Stand to Sit: with modified independence;with upper extremity assist PT Goal: Stand to Sit - Progress: Updated due to goals met PT Goal: Ambulate - Progress: Progressing toward goal  Visit Information  Last PT Received On: 11/03/12 Assistance Needed: +1    Subjective Data  Subjective: Pt reports cough today.  Is worried that when they take the CT out that they might have to put it back in.   Patient Stated Goal: return home, she is going to stay with a friend.     Cognition  Cognition Overall Cognitive Status: Appears within functional limits for tasks assessed/performed Arousal/Alertness: Awake/alert Orientation Level: Appears intact for tasks assessed Behavior During Session: [facility] for tasks performed Cognition - Other Comments: Not specifically tested, conversation normal       End of Session PT - End of Session Activity Tolerance: Patient limited by fatigue Patient left: in bed;with call bell/phone within reach    [place] B. Jamerion Cabello, PT, DPT [phone]   11/03/2012, 10:29 AM

## 2012-11-04 ENCOUNTER — Inpatient Hospital Stay (HOSPITAL_COMMUNITY): Payer: Medicare Other

## 2012-11-04 NOTE — Progress Notes (Signed)
8 Days Post-Op Procedure(s) (LRB): VIDEO ASSISTED THORACOSCOPY (VATS),RGHT UPPER LOBE LUNG WEDGE RESECTION (Right) LOBECTOMY (Right) Subjective: Some incisional pain No difficulties with breathing with tube clamped overnight  Objective: Vital signs in last 24 hours: Temp:  [97.5 F (36.4 C)-98.4 F (36.9 C)] 98.1 F (36.7 C) (04/03 0742) Pulse Rate:  [72-84] 77 (04/03 0000) Cardiac Rhythm:  [-] Normal sinus rhythm (04/03 0303) Resp:  [13-22] 13 (04/03 0303) BP: (124-145)/(48-62) 143/58 mmHg (04/03 0303) SpO2:  [90 %-94 %] 92 % (04/03 0303)  Hemodynamic parameters for last 24 hours:    Intake/Output from previous day: 04/02 0701 - 04/03 0700 In: 1360 [P.O.:960; I.V.:400] Out: 160 [Urine:150; Chest Tube:10] Intake/Output this shift:    General appearance: alert and no distress Heart: regular rate and rhythm Lungs: diminished breath sounds bibasilar Wound: clean and dry  Lab Results:  Recent Labs  11/02/12 0435  WBC 7.8  HGB 10.6*  HCT 30.3*  PLT 249   BMET:  Recent Labs  11/02/12 0435 11/03/12 0405  NA 139 136  K 3.6 3.8  CL 99 98  CO2 34* 31  GLUCOSE 135* 116*  BUN 8 7  CREATININE 0.50 0.49*  CALCIUM 8.6 8.9    PT/INR: No results found for this basename: LABPROT, INR,  in the last 72 hours ABG    Component Value Date/Time   PHART 7.337* 10/28/2012 0251   HCO3 23.8 10/28/2012 0251   TCO2 25.2 10/28/2012 0251   ACIDBASEDEF 1.2 10/28/2012 0251   O2SAT 98.8 10/28/2012 0251   CBG (last 3)   Recent Labs  11/02/12 2139  GLUCAP 94    Assessment/Plan: S/P Procedure(s) (LRB): VIDEO ASSISTED THORACOSCOPY (VATS),RGHT UPPER LOBE LUNG WEDGE RESECTION (Right) LOBECTOMY (Right) - Tolerated having tube clamped- initial puff of air from tube after unclamped but no ongoing air leak, will dc CT  Continue ambulation  Discussed at [facility] conference- no adjuvant therapy  Possibly home tomorrow   LOS: 8 days    Newell Wafer C 11/04/2012

## 2012-11-04 NOTE — Progress Notes (Signed)
Utilization review completed.  

## 2012-11-05 ENCOUNTER — Inpatient Hospital Stay (HOSPITAL_COMMUNITY): Payer: Medicare Other

## 2012-11-05 ENCOUNTER — Other Ambulatory Visit: Payer: Self-pay | Admitting: *Deleted

## 2012-11-05 DIAGNOSIS — R911 Solitary pulmonary nodule: Secondary | ICD-10-CM

## 2012-11-05 MED ORDER — TRAMADOL HCL 50 MG PO TABS: 50.0000 mg | ORAL_TABLET | Freq: Four times a day (QID) | ORAL | Status: DC | PRN

## 2012-11-05 MED ORDER — AMIODARONE HCL 200 MG PO TABS: 200.0000 mg | ORAL_TABLET | Freq: Two times a day (BID) | ORAL | Status: DC

## 2012-11-05 NOTE — Progress Notes (Signed)
Patient discharged home with friend. Discharge instructions given and reviewed with patient.

## 2012-11-05 NOTE — Progress Notes (Signed)
9 Days Post-Op Procedure(s) (LRB): VIDEO ASSISTED THORACOSCOPY (VATS),RGHT UPPER LOBE LUNG WEDGE RESECTION (Right) LOBECTOMY (Right) Subjective: Feels better this AM  Objective: Vital signs in last 24 hours: Temp:  [97.9 F (36.6 C)-98.7 F (37.1 C)] 98.5 F (36.9 C) (04/04 0732) Pulse Rate:  [73-87] 73 (04/04 0400) Cardiac Rhythm:  [-] Normal sinus rhythm (04/04 0400) Resp:  [15-22] 15 (04/04 0400) BP: (137-166)/(58-66) 151/65 mmHg (04/04 0400) SpO2:  [95 %-99 %] 96 % (04/04 0400)  Hemodynamic parameters for last 24 hours:    Intake/Output from previous day: 04/03 0701 - 04/04 0700 In: 360 [P.O.:360] Out: 0  Intake/Output this shift:    General appearance: alert and no distress Lungs: diminished breath sounds bilaterally Wound: clean and dry  Lab Results: No results found for this basename: WBC, HGB, HCT, PLT,  in the last 72 hours BMET:  Recent Labs  11/03/12 0405  NA 136  K 3.8  CL 98  CO2 31  GLUCOSE 116*  BUN 7  CREATININE 0.49*  CALCIUM 8.9    PT/INR: No results found for this basename: LABPROT, INR,  in the last 72 hours ABG    Component Value Date/Time   PHART 7.337* 10/28/2012 0251   HCO3 23.8 10/28/2012 0251   TCO2 25.2 10/28/2012 0251   ACIDBASEDEF 1.2 10/28/2012 0251   O2SAT 98.8 10/28/2012 0251   CBG (last 3)   Recent Labs  11/02/12 2139  GLUCAP 94    Assessment/Plan: S/P Procedure(s) (LRB): VIDEO ASSISTED THORACOSCOPY (VATS),RGHT UPPER LOBE LUNG WEDGE RESECTION (Right) LOBECTOMY (Right) Plan for discharge: see discharge orders IF CXR Ok Arrange HHPT and rolling walker Awaiting CXR - will dc if OK Discussed at [facility]- no adjuvant treatment   LOS: 9 days    Athol Bolds C 11/05/2012

## 2012-11-05 NOTE — Progress Notes (Signed)
Physical Therapy Treatment Patient Details Name: Nancy Hodges MRN: [id number] DOB: November 26, 1945 Today's Date: 11/05/2012 Time: 9562-1308 PT Time Calculation (min): 28 min  PT Assessment / Plan / Recommendation Comments on Treatment Session  35 y/p female adm. to [facility] for RUL lung resection. Chest tube d/c'd yesterday with some residual soreness at incision and to her right breast. Plans to d/c home today with friend. Ambulating well today with RW. Does fatigue with increased activity but much improved from last session and vitals remained stable. Practiced log roll for bed mobility. Patient denies further concerns for d/c home.     Follow Up Recommendations  Home health PT     Does the patient have the potential to tolerate intense rehabilitation     Barriers to Discharge        Equipment Recommendations  Rolling walker with 5" wheels    Recommendations for Other Services    Frequency Min 3X/week   Plan Discharge plan remains appropriate;Frequency remains appropriate    Precautions / Restrictions Precautions Precautions: Fall Restrictions Weight Bearing Restrictions: No   Pertinent Vitals/Pain Reports soreness at chest tube site    Mobility  Bed Mobility Bed Mobility: Rolling Left;Left Sidelying to Sit;Sit to Sidelying Left;Sit to Sidelying Right Rolling Left: 5: Supervision Left Sidelying to Sit: 4: Min assist;HOB flat Sit to Sidelying Left: 5: Supervision;HOB flat Details for Bed Mobility Assistance: verbal sequencing cues throughout, min facilitation to elevate trunk from bed; education provided on benefits of log roll to decrease thoracic pain with bed mobility Transfers Transfers: Sit to Stand;Stand to Sit Sit to Stand: 5: Supervision;With upper extremity assist Stand to Sit: 5: Supervision;With upper extremity assist Details for Transfer Assistance: cues for hand placement Ambulation/Gait Ambulation/Gait Assistance: 5: Supervision;6: Modified independent  (Device/Increase time) Ambulation Distance (Feet): 280 Feet Assistive device: Rolling walker Ambulation/Gait Assistance Details:  modified independence throughout needing closer gaurding towards the end as she began to fatigue, no balance problems with RW today Gait Pattern: Within Functional Limits General Gait Details: Sao2 maintained stable in the 90s on RA, did need to stop to cough a few times    Exercises General Exercises - Lower Extremity Long Arc Quad: AROM;Both;10 reps;Seated Hip Flexion/Marching: AROM;Both;20 reps;Seated Toe Raises: AROM;Both;10 reps;Seated Heel Raises: AROM;Both;10 reps;Seated    PT Goals Acute Rehab PT Goals PT Goal: Supine/Side to Sit - Progress: Progressing toward goal PT Goal: Sit to Supine/Side - Progress: Progressing toward goal PT Goal: Sit to Stand - Progress: Progressing toward goal PT Goal: Stand to Sit - Progress: Progressing toward goal PT Goal: Ambulate - Progress: Met  Visit Information  Last PT Received On: 11/05/12 Assistance Needed: +1    Subjective Data  Subjective: I am so happy with all the care I have had here. I am glad to be leaving but will be sad to say Goodbye.   Cognition  Cognition Overall Cognitive Status: Appears within functional limits for tasks assessed/performed Arousal/Alertness: Awake/alert Orientation Level: Appears intact for tasks assessed Behavior During Session: [facility] for tasks performed    Balance     End of Session PT - End of Session Equipment Utilized During Treatment: Gait belt Activity Tolerance: Patient tolerated treatment well Patient left: in bed;with call bell/phone within reach Nurse Communication: Mobility status   GP     [facility] HELEN 11/05/2012, 12:49 PM

## 2012-11-08 ENCOUNTER — Ambulatory Visit: Payer: Medicare Other | Admitting: Internal Medicine

## 2012-11-10 ENCOUNTER — Ambulatory Visit (INDEPENDENT_AMBULATORY_CARE_PROVIDER_SITE_OTHER): Payer: Self-pay

## 2012-11-10 DIAGNOSIS — Z09 Encounter for follow-up examination after completed treatment for conditions other than malignant neoplasm: Secondary | ICD-10-CM

## 2012-11-10 DIAGNOSIS — Z4802 Encounter for removal of sutures: Secondary | ICD-10-CM

## 2012-11-10 DIAGNOSIS — I2510 Atherosclerotic heart disease of native coronary artery without angina pectoris: Secondary | ICD-10-CM

## 2012-11-10 NOTE — Progress Notes (Unsigned)
[name] returns for suture removal of two previous chest tube sites.  The right thoracotomy incision has been closed subcutaneously and dermabond applied.  All area are well healed.  She is progressing well.  She is staying with a friend in [place] before going home in the near future.  She is requesting a shower chair which I will arrange with home health. Her only complaint is some congestion that she has difficulty expectorating.  I suggested Muccinex and she agreed.  She will return as scheduled with a cxr.

## 2012-11-19 NOTE — Op Note (Signed)
NAMEGLENETTE, BOOKWALTER NO.:  [id number]  MEDICAL RECORD NO.:  [id number]  LOCATION:  3309                         FACILITY:  [facility]  PHYSICIAN:  Salvatore Decent. Dorris Fetch, M.D.DATE OF BIRTH:  1946/07/21  DATE OF PROCEDURE:  10/27/2012 DATE OF DISCHARGE:  11/05/2012                              OPERATIVE REPORT   PREOPERATIVE DIAGNOSIS:  Right upper lobe mass.  POSTOPERATIVE DIAGNOSIS:  Non-small-cell carcinoma, right upper lobe, clinical stage IA.  PROCEDURE:  Right video-assisted thoracoscopy, wedge resection, right upper lobectomy, mediastinal lymph node dissection, On-Q catheter placement.  SURGEON:  Loreli Slot, MD.  ASSISTANT:  Coral Ceo, PA.  ANESTHESIA:  General.  FINDINGS:  A 2-cm mass in the right upper lobe.  Frozen section revealed non-small-cell carcinoma, likely adenocarcinoma. Bronchial margins clear. Normal appearing mediastinal lymph nodes.  CLINICAL NOTE:  This patient is a 67 year old woman who recently had been evaluated for atrial fibrillation and tachycardia-bradycardia syndrome.  She had a pacemaker placement.  Her chest x-ray showed a 1.8- cm mass in the right upper lobe.  A PET-CT showed that a lesion was in fact present and was hypermetabolic. There was no evidence of metastases.  She was advised to undergo right VATS with wedge resection to be followed by lobectomy if the lesion was confirmed to be a cancer. The indications of risks, benefits, and alternatives were discussed in detail with the patient.  She understood and accepted the risks and agreed to proceed.  OPERATIVE NOTE:  This patient was brought to the operating room on October 27, 2012.  She was anesthetized and intubated with a double-lumen endotracheal tube.  Intravenous antibiotics were administered, a Foley catheter was placed.  Sequential compressive devices were placed on the legs for DVT prophylaxis.  She was placed in a left lateral decubitus position  and the right chest was prepped and draped in the usual sterile fashion.  Single lung ventilation of the left lung was carried out and was tolerated well throughout the procedure.  An incision was made in the eighth intercostal space in the midaxillary Line. It was carried through the skin and subcutaneous tissue.  The chest was entered bluntly using a hemostat, a port was inserted and the thoracoscope was placed in the chest.  A small utility incision was made in the anterolateral chest in the fourth to fifth interspace area.  No rib spreading was performed during the procedure.  The chest was inspected, there was no effusion and no abnormality of the parietal pleura.  The mass was identified in the right upper lobe and a wedge resection was performed with sequential firings of an Endo-GIA stapler. The specimen was placed into an endoscopic retrieval bag and removed, it was sent for frozen section which returned non-small-cell carcinoma, probable adenocarcinoma.  As planned, given the positive diagnosis, the right upper lobectomy was completed.  The inferior pulmonary ligament was divided. The pleural reflection was divided medially along on the hilum.  The pulmonary veins were identified.  The superior pulmonary vein was identified, the middle lobe branches were identified and preserved.  The upper lobe branches were encircled and divided with an endoscopic stapler. Lymph nodes that were encountered during the dissection of  the vessels were taken and sent as a separate specimens as they were encountered. The dissection was carried more superiorly, the upper lobe arterial branches were isolated and divided with an endoscopic GIA stapler.  The minor and major fissures were completed with sequential firings of an endoscopic stapler.  The posterior ascending pulmonary arterial branch of the right upper lobe was identified and divided.  The lymph nodes along the right upper lobe bronchus  were dissected and taken as part of the specimen.  A stapler was placed across the bronchial stump and closed but not fired.  A test inflation revealed good aeration of the lower and middle lobes.  The stapler then was fired dividing the bronchus.  The remainder of the right upper lobe was removed and sent for pathology.  Frozen section of the bronchial margin was negative.  The bronchial stump was intact.  The level 7 subcarinal nodes were dissected out as were the level 4R paratracheal nodes.  These were sent as separate specimens.  All of the lymph nodes appeared grossly benign throughout the procedure.  The chest was filled with warm saline. A test inflation to 30 cm of water revealed minimal air leak from the staple lines in the fissure and no leak from the bronchial stump. Hemostasis was achieved. An On-Q local anesthetic catheter was placed through a separate stab wound posteriorly and tunneled in a subpleural plane. It was secured at the skin with a 3-0 silk suture. The catheter was primed with 5 ml of marcaine. A 28 F chest tube was placed through the original post site and directed anteriorly to the apex. A 32 F drain was placed through a separate incision and directed posteriorly. The lung was reinflated and there was good reexpansion of both the lower and middle lobes. The incision was closed with a 0 Vicryl fascial suture, a 2-0 vicryl subcutaneous suture and a 3-0 Vicryl subcuticular suture.  She was placed back in a supine position. She was extubated in the operating room and taken to the PACU in good condition.  All sponge, needle and instrument counts were correct.    THIS IS A REDICTATION OF PREVIOUSLY DICTATED NOTE    Salvatore Decent. Dorris Fetch, M.D.     SCH/MEDQ  D:  11/18/2012  T:  11/19/2012  Job:  [id number]

## 2012-11-23 ENCOUNTER — Encounter: Payer: Self-pay | Admitting: Thoracic Surgery (Cardiothoracic Vascular Surgery)

## 2012-11-23 ENCOUNTER — Ambulatory Visit (INDEPENDENT_AMBULATORY_CARE_PROVIDER_SITE_OTHER): Payer: Self-pay | Admitting: Thoracic Surgery (Cardiothoracic Vascular Surgery)

## 2012-11-23 ENCOUNTER — Ambulatory Visit
Admission: RE | Admit: 2012-11-23 | Discharge: 2012-11-23 | Disposition: A | Discharge status: Home / Self Care | Payer: Medicare Other | Source: Ambulatory Visit | Attending: Thoracic Surgery (Cardiothoracic Vascular Surgery) | Admitting: Thoracic Surgery (Cardiothoracic Vascular Surgery)

## 2012-11-23 VITALS — BP 139/76 | HR 74 | Resp 20 | Ht 62.0 in | Wt 154.0 lb

## 2012-11-23 DIAGNOSIS — Z9889 Other specified postprocedural states: Secondary | ICD-10-CM

## 2012-11-23 DIAGNOSIS — Z902 Acquired absence of lung [part of]: Secondary | ICD-10-CM

## 2012-11-23 DIAGNOSIS — R911 Solitary pulmonary nodule: Secondary | ICD-10-CM

## 2012-11-23 DIAGNOSIS — Z483 Aftercare following surgery for neoplasm: Secondary | ICD-10-CM

## 2012-11-23 DIAGNOSIS — C3491 Malignant neoplasm of unspecified part of right bronchus or lung: Secondary | ICD-10-CM

## 2012-11-23 DIAGNOSIS — Z09 Encounter for follow-up examination after completed treatment for conditions other than malignant neoplasm: Secondary | ICD-10-CM

## 2012-11-23 DIAGNOSIS — C3490 Malignant neoplasm of unspecified part of unspecified bronchus or lung: Secondary | ICD-10-CM

## 2012-11-23 NOTE — Patient Instructions (Signed)
Continue present medications until your follow up with Dr. Johney Frame  May resume normal activities- advised to build into activities gradually  May drive - use extra caution  Return in 3 months

## 2012-11-23 NOTE — Progress Notes (Signed)
HPI:  [name] per is a 67 year old woman who was found to have a right upper lobe mass at the time of pacemaker placement. She underwent surgical resection with a thoracoscopic lobectomy on 10/27/2012. Her postoperative course was uncomplicated. Her lesion turned out to be a stage IB non-small cell carcinoma.  She says that she has been feeling well. Her exercise tolerance is good and continues to improve. She's not having any difficulty with shortness of breath or wheezing. She has stopped taking pain medication. She still does have some discomfort, but is not severe. Overall she feels well and is anxious to resume full activities.  Past Medical History  Diagnosis Date  . Asthma   . History of sinusitis   . Allergic rhinitis   . History of kidney stones   . 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 H/O hiatal hernia   . GERD (gastroesophageal reflux disease)   . Pulmonary nodule   . Paroxysmal atrial fibrillation     CHADS score: 0  . Atrial flutter   . Tachycardia-bradycardia syndrome   . Anxiety   . Headache     sinus   on cancer, non-small cell, stage IB, status post thoracoscopic right upper lobectomy   Current Outpatient Prescriptions  Medication Sig Dispense Refill  . acetaminophen (TYLENOL) 500 MG tablet Take 500 mg by mouth every 6 (six) hours as needed for pain.      [name] amiodarone (PACERONE) 200 MG tablet Take 1 tablet (200 mg total) by mouth 2 (two) times daily.  60 tablet  1  . diltiazem (CARDIZEM CD) 240 MG 24 hr capsule Take 240 mg by mouth daily.      . fexofenadine (ALLEGRA) 180 MG tablet Take 180 mg by mouth daily.      . fluticasone (FLONASE) 50 MCG/ACT nasal spray Place 2 sprays into the nose daily as needed (for congestion).      . Multiple Vitamin (MULTIVITAMIN WITH MINERALS) TABS Take 1 tablet by mouth daily.      . Rivaroxaban (XARELTO) 20 MG TABS Take 20 mg by mouth daily.      . traMADol (ULTRAM) 50 MG tablet Take 1 tablet (50 mg total) by  mouth every 6 (six) hours as needed.  50 tablet  0   No current facility-administered medications for this visit.    Physical Exam BP 139/76  Pulse 74  Resp 20  Ht 5\' 2"  (1.575 m)  Wt 154 lb (69.854 kg)  BMI 28.16 kg/m2  SpO37 34% 67 year old woman in no acute distress General appears well Lungs clear bilaterally Cardiac regular rate and rhythm 2/6 systolic murmur Incisions clean dry and intact  Diagnostic Tests: Chest x-ray 11/23/2012  Impression: 67 year old woman now about a month out from thoracoscopic lobectomy. She's doing extremely well at this point in time. Her exercise tolerance is good and continues to improve. I see no reason she cannot resume full activities. I did caution her to build into new activities gradually to avoid overdoing it initially. She may drive, appropriate precautions were discussed.  She had questions related to her cardiac medications. I recommended that she continue all medications until she sees Dr. Johney Frame in May.  We did discuss her case it [facility] conference. She does not need any adjuvant therapy at this time. We will plan to follow her every 4 months for the first 2 years.   Plan:  Return in 3 months for a 4 month followup visit.  CT of chest  at that time

## 2012-12-29 ENCOUNTER — Ambulatory Visit (INDEPENDENT_AMBULATORY_CARE_PROVIDER_SITE_OTHER): Payer: Medicare Other | Admitting: Internal Medicine

## 2012-12-29 ENCOUNTER — Encounter: Payer: Self-pay | Admitting: Internal Medicine

## 2012-12-29 VITALS — BP 133/51 | HR 75 | Ht 62.0 in | Wt 152.8 lb

## 2012-12-29 DIAGNOSIS — I495 Sick sinus syndrome: Secondary | ICD-10-CM

## 2012-12-29 DIAGNOSIS — I480 Paroxysmal atrial fibrillation: Secondary | ICD-10-CM

## 2012-12-29 DIAGNOSIS — I4891 Unspecified atrial fibrillation: Secondary | ICD-10-CM

## 2012-12-29 MED ORDER — AMIODARONE HCL 200 MG PO TABS: 200.0000 mg | ORAL_TABLET | Freq: Every day | ORAL | Status: DC

## 2012-12-29 NOTE — Patient Instructions (Addendum)
Your physician recommends that you schedule a follow-up appointment in: 3 months with Dr Allred    Your physician has recommended you make the following change in your medication:  1) Decrease Amiodarone to 200mg daily   

## 2012-12-29 NOTE — Progress Notes (Signed)
  Nancy Hodges is a 67 y.o. female who presents today for routine electrophysiology followup.  She is recovering nicely from lung surgery.  Today, she denies symptoms of chest pain, shortness of breath,  lower extremity edema, dizziness, presyncope, or syncope.  Her palpitations have improved.  The patient is otherwise without complaint today.   Past Medical History  Diagnosis Date  . Asthma   . History of sinusitis   . Allergic rhinitis   . History of kidney stones   . 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 H/O hiatal hernia   . GERD (gastroesophageal reflux disease)   . Pulmonary nodule   . Paroxysmal atrial fibrillation     CHADS score: 0  . Atrial flutter   . Tachycardia-bradycardia syndrome   . Anxiety   . Headache(784.0)     sinus   Past Surgical History  Procedure Laterality Date  . Partial hysterectomy    . Pacemaker insertion  09/14/2012    Nanostim (SJM) leadless pacemaker (LEADLESS II STUDY PATIENT) implanted by Dr Johney Frame  . Video assisted thoracoscopy (vats)/wedge resection Right 10/27/2012    Procedure: VIDEO ASSISTED THORACOSCOPY (VATS),RGHT UPPER LOBE LUNG WEDGE RESECTION;  Surgeon: Loreli Slot, MD;  Location: [facility] OR;  Service: Thoracic;  Laterality: Right;  . Lobectomy Right 10/27/2012    Procedure: LOBECTOMY;  Surgeon: Loreli Slot, MD;  Location: [facility] OR;  Service: Thoracic;  Laterality: Right;   RIGHT UPPER LOBECTOMY & Node Dissection    Current Outpatient Prescriptions  Medication Sig Dispense Refill  . acetaminophen (TYLENOL) 500 MG tablet Take 500 mg by mouth every 6 (six) hours as needed for pain.      [name] amiodarone (PACERONE) 200 MG tablet Take 1 tablet (200 mg total) by mouth 2 (two) times daily.  60 tablet  1  . diltiazem (CARDIZEM CD) 240 MG 24 hr capsule Take 240 mg by mouth daily.      . fluticasone (FLONASE) 50 MCG/ACT nasal spray Place 2 sprays into the nose daily as needed (for congestion).      [name] loratadine  (CLARITIN) 10 MG tablet Take 10 mg by mouth daily.      . Multiple Vitamin (MULTIVITAMIN WITH MINERALS) TABS Take 1 tablet by mouth daily.      . Rivaroxaban (XARELTO) 20 MG TABS Take 20 mg by mouth daily.       No current facility-administered medications for this visit.    Physical Exam: Filed Vitals:   12/29/12 1408  BP: 133/51  Pulse: 75  Height: 5\' 2"  (1.575 m)  Weight: 152 lb 12.8 oz (69.31 kg)    GEN- The patient is well appearing, alert and oriented x 3 today.   Head- normocephalic, atraumatic Eyes-  Sclera clear, conjunctiva pink Ears- hearing intact Oropharynx- clear Lungs- Clear to ausculation bilaterally, normal work of breathing Heart- Regular rate and rhythm, no murmurs, rubs or gallops, PMI not laterally displaced GI- soft, NT, ND, + BS Extremities- no clubbing, cyanosis, or edema  Pacemaker interrogation- reviewed in detail today,  See PACEART report  Assessment and Plan:  1. Tachycardia/ Bradycardia Normal pacemaker function See Pace Art report No changes today  2. afib continue xarelto 20mg  daily Decrease amiodarone to 200mg  daily Consider decreasing to 100mg  daily upon return  Return in 3 months

## 2012-12-30 LAB — PACEMAKER DEVICE OBSERVATION
BATTERY VOLTAGE: 3.3 V
RV LEAD AMPLITUDE: 12 mv
RV LEAD IMPEDENCE PM: 590 Ohm
RV LEAD THRESHOLD: 0.5 V
VENTRICULAR PACING PM: 0

## 2013-01-11 ENCOUNTER — Other Ambulatory Visit: Payer: Self-pay | Admitting: *Deleted

## 2013-01-11 MED ORDER — AMIODARONE HCL 200 MG PO TABS: 200.0000 mg | ORAL_TABLET | Freq: Every day | ORAL | Status: DC

## 2013-01-11 NOTE — Telephone Encounter (Signed)
Fax Received. Refill Completed. Nancy Hodges (R.M.A)   

## 2013-01-17 DIAGNOSIS — R042 Hemoptysis: Secondary | ICD-10-CM

## 2013-01-18 ENCOUNTER — Inpatient Hospital Stay (HOSPITAL_COMMUNITY): Payer: Medicare Other

## 2013-01-18 ENCOUNTER — Inpatient Hospital Stay (HOSPITAL_COMMUNITY)
Admission: AD | Admit: 2013-01-18 | Discharge: 2013-01-21 | DRG: 204 | Disposition: A | Discharge status: Home / Self Care | Payer: Medicare Other | Source: Other Acute Inpatient Hospital | Attending: Pulmonary Disease | Admitting: Pulmonary Disease

## 2013-01-18 ENCOUNTER — Encounter (HOSPITAL_COMMUNITY): Payer: Self-pay | Admitting: *Deleted

## 2013-01-18 DIAGNOSIS — Z950 Presence of cardiac pacemaker: Secondary | ICD-10-CM

## 2013-01-18 DIAGNOSIS — J45909 Unspecified asthma, uncomplicated: Secondary | ICD-10-CM | POA: Diagnosis present

## 2013-01-18 DIAGNOSIS — I480 Paroxysmal atrial fibrillation: Secondary | ICD-10-CM

## 2013-01-18 DIAGNOSIS — C3490 Malignant neoplasm of unspecified part of unspecified bronchus or lung: Secondary | ICD-10-CM | POA: Diagnosis present

## 2013-01-18 DIAGNOSIS — D689 Coagulation defect, unspecified: Secondary | ICD-10-CM

## 2013-01-18 DIAGNOSIS — Z87891 Personal history of nicotine dependence: Secondary | ICD-10-CM

## 2013-01-18 DIAGNOSIS — K219 Gastro-esophageal reflux disease without esophagitis: Secondary | ICD-10-CM | POA: Diagnosis present

## 2013-01-18 DIAGNOSIS — I4891 Unspecified atrial fibrillation: Secondary | ICD-10-CM | POA: Diagnosis present

## 2013-01-18 DIAGNOSIS — R042 Hemoptysis: Principal | ICD-10-CM

## 2013-01-18 DIAGNOSIS — F411 Generalized anxiety disorder: Secondary | ICD-10-CM | POA: Diagnosis present

## 2013-01-18 DIAGNOSIS — I4892 Unspecified atrial flutter: Secondary | ICD-10-CM | POA: Diagnosis present

## 2013-01-18 DIAGNOSIS — C3491 Malignant neoplasm of unspecified part of right bronchus or lung: Secondary | ICD-10-CM

## 2013-01-18 DIAGNOSIS — I495 Sick sinus syndrome: Secondary | ICD-10-CM

## 2013-01-18 DIAGNOSIS — R911 Solitary pulmonary nodule: Secondary | ICD-10-CM

## 2013-01-18 DIAGNOSIS — J189 Pneumonia, unspecified organism: Secondary | ICD-10-CM

## 2013-01-18 HISTORY — DX: Malignant neoplasm of unspecified part of unspecified bronchus or lung: C34.90

## 2013-01-18 LAB — PROTIME-INR: Prothrombin Time: 24 seconds — ABNORMAL HIGH (ref 11.6–15.2)

## 2013-01-18 LAB — COMPREHENSIVE METABOLIC PANEL
ALT: 48 U/L — ABNORMAL HIGH (ref 0–35)
Alkaline Phosphatase: 100 U/L (ref 39–117)
CO2: 27 mEq/L (ref 19–32)
Chloride: 104 mEq/L (ref 96–112)
GFR calc Af Amer: >90 mL/min (ref >90)
GFR calc non Af Amer: 88 mL/min — ABNORMAL LOW (ref >90)
Glucose, Bld: 121 mg/dL — ABNORMAL HIGH (ref 70–99)
Potassium: 4 mEq/L (ref 3.5–5.1)
Sodium: 139 mEq/L (ref 135–145)
Total Bilirubin: 0.3 mg/dL (ref 0.3–1.2)

## 2013-01-18 LAB — CBC WITH DIFFERENTIAL/PLATELET
Basophils Absolute: 0 10*3/uL (ref 0.0–0.1)
Eosinophils Relative: 1 % (ref 0–5)
Lymphocytes Relative: 30 % (ref 12–46)
MCV: 90.2 fL (ref 78.0–100.0)
Platelets: 300 10*3/uL (ref 150–400)
RDW: 13.3 % (ref 11.5–15.5)
WBC: 8.7 10*3/uL (ref 4.0–10.5)

## 2013-01-18 LAB — CORTISOL: Cortisol, Plasma: 2.9 ug/dL

## 2013-01-18 LAB — PRO B NATRIURETIC PEPTIDE: Pro B Natriuretic peptide (BNP): 160.3 pg/mL — ABNORMAL HIGH (ref 0–125)

## 2013-01-18 LAB — TROPONIN I: Troponin I: <0.3 ng/mL (ref <0.30)

## 2013-01-18 LAB — CBC
Hemoglobin: 11.2 g/dL — ABNORMAL LOW (ref 12.0–15.0)
MCH: 30.3 pg (ref 26.0–34.0)
RBC: 3.7 MIL/uL — ABNORMAL LOW (ref 3.87–5.11)

## 2013-01-18 LAB — MRSA PCR SCREENING: MRSA by PCR: NEGATIVE

## 2013-01-18 LAB — LIPASE, BLOOD: Lipase: 46 U/L (ref 11–59)

## 2013-01-18 LAB — PROCALCITONIN: Procalcitonin: <0.1 ng/mL

## 2013-01-18 LAB — LACTIC ACID, PLASMA: Lactic Acid, Venous: 1.5 mmol/L (ref 0.5–2.2)

## 2013-01-18 LAB — D-DIMER, QUANTITATIVE: D-Dimer, Quant: 0.44 ug/mL-FEU (ref 0.00–0.48)

## 2013-01-18 MED ORDER — HYDROCOD POLST-CHLORPHEN POLST 10-8 MG/5ML PO LQCR: 5.0000 mL | Freq: Two times a day (BID) | ORAL | Status: DC | PRN

## 2013-01-18 MED ORDER — AMIODARONE HCL 200 MG PO TABS
200.0000 mg | ORAL_TABLET | Freq: Every day | ORAL | Status: DC
  Administered 2013-01-18 – 2013-01-21 (×4): 200 mg via ORAL
  Filled 2013-01-18 (×4): qty 1

## 2013-01-18 MED ORDER — ALBUTEROL SULFATE (5 MG/ML) 0.5% IN NEBU: 2.5000 mg | INHALATION_SOLUTION | RESPIRATORY_TRACT | Status: DC | PRN

## 2013-01-18 MED ORDER — PANTOPRAZOLE SODIUM 40 MG PO TBEC: 40.0000 mg | DELAYED_RELEASE_TABLET | Freq: Every day | ORAL | Status: DC

## 2013-01-18 MED ORDER — IPRATROPIUM BROMIDE 0.02 % IN SOLN
0.5000 mg | Freq: Four times a day (QID) | RESPIRATORY_TRACT | Status: DC | PRN
  Administered 2013-01-18 – 2013-01-19 (×4): 0.5 mg via RESPIRATORY_TRACT
  Filled 2013-01-18 (×4): qty 2.5

## 2013-01-18 MED ORDER — SODIUM CHLORIDE 0.9 % IV SOLN: 250.0000 mL | INTRAVENOUS | Status: DC | PRN

## 2013-01-18 MED ORDER — LEVOFLOXACIN IN D5W 750 MG/150ML IV SOLN
750.0000 mg | Freq: Every day | INTRAVENOUS | Status: DC
  Administered 2013-01-18: 750 mg via INTRAVENOUS
  Filled 2013-01-18: qty 150

## 2013-01-18 MED ORDER — ALBUTEROL SULFATE (5 MG/ML) 0.5% IN NEBU
2.5000 mg | INHALATION_SOLUTION | Freq: Four times a day (QID) | RESPIRATORY_TRACT | Status: DC | PRN
  Administered 2013-01-18 – 2013-01-19 (×4): 2.5 mg via RESPIRATORY_TRACT
  Filled 2013-01-18 (×4): qty 0.5

## 2013-01-18 MED ORDER — HYDROCOD POLST-CHLORPHEN POLST 10-8 MG/5ML PO LQCR
5.0000 mL | Freq: Two times a day (BID) | ORAL | Status: DC
  Administered 2013-01-18 – 2013-01-21 (×7): 5 mL via ORAL
  Filled 2013-01-18 (×7): qty 5

## 2013-01-18 MED ORDER — IPRATROPIUM BROMIDE 0.02 % IN SOLN
0.5000 mg | RESPIRATORY_TRACT | Status: DC
  Administered 2013-01-18 (×3): 0.5 mg via RESPIRATORY_TRACT
  Filled 2013-01-18 (×3): qty 2.5

## 2013-01-18 MED ORDER — DILTIAZEM HCL ER COATED BEADS 240 MG PO CP24
240.0000 mg | ORAL_CAPSULE | Freq: Every day | ORAL | Status: DC
  Administered 2013-01-18 – 2013-01-21 (×4): 240 mg via ORAL
  Filled 2013-01-18 (×4): qty 1

## 2013-01-18 MED ORDER — ALBUTEROL SULFATE (5 MG/ML) 0.5% IN NEBU
2.5000 mg | INHALATION_SOLUTION | RESPIRATORY_TRACT | Status: DC
  Administered 2013-01-18 (×3): 2.5 mg via RESPIRATORY_TRACT
  Filled 2013-01-18 (×3): qty 0.5

## 2013-01-18 MED ORDER — LORATADINE 10 MG PO TABS
10.0000 mg | ORAL_TABLET | Freq: Every day | ORAL | Status: DC
  Administered 2013-01-18 – 2013-01-21 (×4): 10 mg via ORAL
  Filled 2013-01-18 (×4): qty 1

## 2013-01-18 MED ORDER — LEVOFLOXACIN 500 MG PO TABS
500.0000 mg | ORAL_TABLET | Freq: Every day | ORAL | Status: DC
  Administered 2013-01-18 – 2013-01-21 (×4): 500 mg via ORAL
  Filled 2013-01-18 (×5): qty 1

## 2013-01-18 MED ORDER — SODIUM CHLORIDE 0.9 % IV SOLN
INTRAVENOUS | Status: DC
  Administered 2013-01-18 (×2): via INTRAVENOUS

## 2013-01-18 NOTE — H&P (Signed)
PULMONARY  / CRITICAL CARE MEDICINE  Name: Nancy Hodges MRN: [id number] DOB: 1945/10/29    ADMISSION DATE:  01/18/2013 CONSULTATION DATE:  01/18/2013  REFERRING MD :  Maryruth Bun EDP PRIMARY SERVICE:  PCCM  CHIEF COMPLAINT:  Hemoptysis  BRIEF PATIENT DESCRIPTION: 67 yo with past medical history of AF on Xarelto, recent RU lobectomy (Hendrickson) and chronic cough brought to [facility] ED with complaints of hemoptysis.  Transferred to [facility] for further management.  Reports chronic cough since returning home after surgery.  There was no significant dyspnea, fever or secretions.  On the day of the admission felt blood in her mouth after coughinh paroxysm.  Denies epistaxis, hematemesis or bleeding form any other source.  SIGNIFICANT EVENTS / STUDIES:  6/16 Chest CT [facility]) >>> RLL / RML airspace disease / blood, multiple nonspecific pulmonary nodules, s/p RU lobectomy, cholelithiasis without cholecystitis   LINES / TUBES:  CULTURES:  ANTIBIOTICS: Levaquin 6/27 >>>  HISTORY OF PRESENT ILLNESS:  67 yo with past medical history of AF on Xarelto, recent RU lobectomy (Hendrickson) and chronic cough brought to [facility] ED with complaints of hemoptysis.  Transferred to [facility] for further management.  Reports chronic cough since returning home after surgery.  There was no significant dyspnea, fever or secretions.  On the day of the admission felt blood in her mouth after coughinh paroxysm.  Denies epistaxis, hematemesis or bleeding form any other source.  PAST MEDICAL HISTORY :  Past Medical History  Diagnosis Date  . Asthma   . History of sinusitis   . Allergic rhinitis   . History of kidney stones   . 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 H/O hiatal hernia   . GERD (gastroesophageal reflux disease)   . Pulmonary nodule   . Paroxysmal atrial fibrillation     CHADS score: 0  . Atrial flutter   . Tachycardia-bradycardia syndrome   . Anxiety   . Headache(784.0)     sinus    Past Surgical History  Procedure Laterality Date  . Partial hysterectomy    . Pacemaker insertion  09/14/2012    Nanostim (SJM) leadless pacemaker (LEADLESS II STUDY PATIENT) implanted by Dr Johney Frame  . Video assisted thoracoscopy (vats)/wedge resection Right 10/27/2012    Procedure: VIDEO ASSISTED THORACOSCOPY (VATS),RGHT UPPER LOBE LUNG WEDGE RESECTION;  Surgeon: Loreli Slot, MD;  Location: [facility] OR;  Service: Thoracic;  Laterality: Right;  . Lobectomy Right 10/27/2012    Procedure: LOBECTOMY;  Surgeon: Loreli Slot, MD;  Location: [facility] OR;  Service: Thoracic;  Laterality: Right;   RIGHT UPPER LOBECTOMY & Node Dissection   Prior to Admission medications   Medication Sig Start Date End Date Taking? Authorizing Provider  acetaminophen (TYLENOL) 500 MG tablet Take 500 mg by mouth every 6 (six) hours as needed for pain.    Historical Provider, MD  amiodarone (PACERONE) 200 MG tablet Take 1 tablet (200 mg total) by mouth daily. 01/11/13   Hillis Range, MD  diltiazem (CARDIZEM CD) 240 MG 24 hr capsule Take 240 mg by mouth daily.    Historical Provider, MD  fluticasone (FLONASE) 50 MCG/ACT nasal spray Place 2 sprays into the nose daily as needed (for congestion).    Historical Provider, MD  loratadine (CLARITIN) 10 MG tablet Take 10 mg by mouth daily.    Historical Provider, MD  Multiple Vitamin (MULTIVITAMIN WITH MINERALS) TABS Take 1 tablet by mouth daily.    Historical Provider, MD  Rivaroxaban (XARELTO) 20 MG TABS Take 20  mg by mouth daily.    Historical Provider, MD   Allergies  Allergen Reactions  . Prednisone Shortness Of Breath and Other (See Comments)    REACTION: double vision  . Septra (Sulfamethoxazole W-Trimethoprim) Shortness Of Breath  . Vioxx (Rofecoxib) Other (See Comments)    REACTION:  Stomach cramps  . Penicillins Swelling and Rash   FAMILY HISTORY:  Family History  Problem Relation Age of Onset  . Heart disease Mother   . Cancer Mother   . Diabetes Mother    . Asthma Father   . Heart disease Father   . Cancer Brother   . Diabetes Sister   . Cancer Sister   . Diabetes Brother   . Heart attack Son    SOCIAL HISTORY:  reports that she quit smoking about 17 years ago. Her smoking use included Cigarettes. She has a 80 pack-year smoking history. She does not have any smokeless tobacco history on file. She reports that she does not drink alcohol or use illicit drugs.  REVIEW OF SYSTEMS:  Negative except as in HPI.  INTERVAL HISTORY:  VITAL SIGNS: Temp:  [98.6 F (37 C)] 98.6 F (37 C) (06/17 0049) Pulse Rate:  [77-95] 77 (06/17 0200) Resp:  [16-27] 23 (06/17 0200) BP: (133-173)/(39-62) 133/39 mmHg (06/17 0200) SpO2:  [94 %-100 %] 94 % (06/17 0200) Weight:  [69 kg (152 lb 1.9 oz)] 69 kg (152 lb 1.9 oz) (06/17 0130)  HEMODYNAMICS:   VENTILATOR SETTINGS:   INTAKE / OUTPUT: Intake/Output     06/16 0701 - 06/17 0700   I.V. (mL/kg) 16.3 (0.2)   Total Intake(mL/kg) 16.3 (0.2)   Net +16.3       Emesis Occurrence 1 x    PHYSICAL EXAMINATION: General:  Anxious, no acute distress Neuro:  Awake, alert, oriented HEENT:  PERRL, no blood noted oropharynx, nares  Cardiovascular:  RRR, no m/r/g Lungs:  Bilateral diminished air entry, few expiratory wheezes R lung Abdomen:  Soft, nontender, bowel sounds diminished Musculoskeletal:  Moves all extremities, no edema Skin:  Intact  LABS:  Recent Labs Lab 01/18/13 0120  HGB 12.2  WBC 8.7  PLT 300  NA 139  K 4.0  CL 104  CO2 27  GLUCOSE 121*  BUN 16  CREATININE 0.66  CALCIUM 9.6  MG 2.1  PHOS 2.6  AST 41*  ALT 48*  ALKPHOS 100  BILITOT 0.3  PROT 7.4  ALBUMIN 3.6  APTT 49*  INR 2.26*  LATICACIDVEN 1.5  TROPONINI <0.30  PROBNP 160.3*   No results found for this basename: GLUCAP,  in the last 168 hours  CXR:  6/17 >>> Postop changes, otherwise nad  ASSESSMENT / PLAN:  PAF / tachy-brady syndrome on Xarelto Coagulopathy secondary to above Chronic cough, possible  reactive airway disease Possible bronchitis vs bronchopneumonia (CAP) Hemoptysis in above setting, suspected source R lung S/p RU lobectomy for stage IA NSCLCA  -->  TCTS aware -->  Hold Xarelto, may need to use Feiba if life threatening hemorrhage -->  If hemorgage continues past 24 hours or increases, flexible bronchoscopy to localize side -->  If life threatening hemorrhage, to IR for angio / embolization, presuming R bronchial artery source -->  CBC q6h -->  PCT and empirical Levaquin -->  Albuterol / Atrovent scheduled / PRN -->  Supplemental oxygen PRN goal SpO2>92 -->  Continue Amiodarone, Cardizem, Claritin  -->  SCDs for DVT Px -->  GI Px is not required  I have personally obtained a history,  examined the patient, evaluated laboratory and imaging results, formulated the assessment and plan and placed orders.  CRITICAL CARE:  The patient is critically ill with multiple organ systems failure and requires high complexity decision making for assessment and support, frequent evaluation and titration of therapies, application of advanced monitoring technologies and extensive interpretation of multiple databases. Critical Care Time devoted to patient care services described in this note is 40 minutes.   Lonia Farber, MD Pulmonary and Critical Care Medicine [facility] Pager: [phone]  01/18/2013, 2:24 AM

## 2013-01-18 NOTE — Progress Notes (Signed)
Patient was transferred to 4700 today from 2900.  Patient is still coughing up a  moderate amount of bloody sputum.  Patient stable. Alert and oriented x 4. Ambulatory in room without assist.  VS WNL. HR REG. No edema noted upon assessment.

## 2013-01-18 NOTE — Consult Note (Signed)
Reason for Consult:Hemoptysis Referring Physician: Dr. Erick Blinks Nancy Hodges is an 67 y.o. female.  HPI: 67 yo woman well known to me. She had a lung nodule noted incidentally on CXR done at time of pacemaker implantation. 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 She has recently been having a severe cough. Thsi has been persistent over the past few weeks. Last night she began having hemoptysis. She went to ED at [facility]. A CT showed some intra-alveolar blood but no endobronchial lesions and no adenopathy. She was transferred to [facility] . She continued to have some hemoptysis but her cough has now resolved.  Past Medical History  Diagnosis Date  . Asthma   . History of sinusitis   . Allergic rhinitis   . History of kidney stones   . 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 H/O hiatal hernia   . GERD (gastroesophageal reflux disease)   . Pulmonary nodule   . Paroxysmal atrial fibrillation     CHADS score: 0  . Atrial flutter   . Tachycardia-bradycardia syndrome   . Anxiety   . Headache(784.0)     sinus    Past Surgical History  Procedure Laterality Date  . Partial hysterectomy    . Pacemaker insertion  09/14/2012    Nanostim (SJM) leadless pacemaker (LEADLESS II STUDY PATIENT) implanted by Dr Johney Frame  . Video assisted thoracoscopy (vats)/wedge resection Right 10/27/2012    Procedure: VIDEO ASSISTED THORACOSCOPY (VATS),RGHT UPPER LOBE LUNG WEDGE RESECTION;  Surgeon: Loreli Slot, MD;  Location: [facility] OR;  Service: Thoracic;  Laterality: Right;  . Lobectomy Right 10/27/2012    Procedure: LOBECTOMY;  Surgeon: Loreli Slot, MD;  Location: [facility] OR;  Service: Thoracic;  Laterality: Right;   RIGHT UPPER LOBECTOMY & Node Dissection    Family History  Problem Relation Age of Onset  . Heart disease Mother   . Cancer Mother   . Diabetes Mother   . Asthma Father   . Heart disease Father   . Cancer Brother   . Diabetes Sister    . Cancer Sister   . Diabetes Brother   . Heart attack Son     Social History:  reports that she quit smoking about 17 years ago. Her smoking use included Cigarettes. She has a 80 pack-year smoking history. She does not have any smokeless tobacco history on file. She reports that she does not drink alcohol or use illicit drugs.  Allergies:  Allergies  Allergen Reactions  . Prednisone Shortness Of Breath and Other (See Comments)    REACTION: double vision  . Septra (Sulfamethoxazole W-Trimethoprim) Shortness Of Breath  . Vioxx (Rofecoxib) Other (See Comments)    REACTION:  Stomach cramps  . Penicillins Swelling and Rash    Medications:  Prior to Admission:  Prescriptions prior to admission  Medication Sig Dispense Refill  . acetaminophen (TYLENOL) 500 MG tablet Take 500 mg by mouth every 6 (six) hours as needed for pain.      [name] amiodarone (PACERONE) 200 MG tablet Take 1 tablet (200 mg total) by mouth daily.  30 tablet  6  . diltiazem (CARDIZEM CD) 240 MG 24 hr capsule Take 240 mg by mouth daily.      . fluticasone (FLONASE) 50 MCG/ACT nasal spray Place 2 sprays into the nose daily as needed (for congestion).      [name] loratadine (CLARITIN) 10 MG tablet Take 10 mg by mouth daily.      [name]  Multiple Vitamin (MULTIVITAMIN WITH MINERALS) TABS Take 1 tablet by mouth daily.      . Rivaroxaban (XARELTO) 20 MG TABS Take 20 mg by mouth daily.        Results for orders placed during the hospital encounter of 01/18/13 (from the past 48 hour(s))  COMPREHENSIVE METABOLIC PANEL     Status: Abnormal   Collection Time    01/18/13  1:20 AM      Result Value Range   Sodium 139  135 - 145 mEq/L   Potassium 4.0  3.5 - 5.1 mEq/L   Chloride 104  96 - 112 mEq/L   CO2 27  19 - 32 mEq/L   Glucose, Bld 121 (*) 70 - 99 mg/dL   BUN 16  6 - 23 mg/dL   Creatinine, Ser 9.56  0.50 - 1.10 mg/dL   Calcium 9.6  8.4 - 21.3 mg/dL   Total Protein 7.4  6.0 - 8.3 g/dL   Albumin 3.6  3.5 - 5.2 g/dL   AST 41 (*) 0 -  37 U/L   ALT 48 (*) 0 - 35 U/L   Alkaline Phosphatase 100  39 - 117 U/L   Total Bilirubin 0.3  0.3 - 1.2 mg/dL   GFR calc non Af Amer 88 (*) >90 mL/min   GFR calc Af Amer >90  >90 mL/min   Comment:            The eGFR has been calculated     using the CKD EPI equation.     This calculation has not been     validated in all clinical     situations.     eGFR's persistently     <90 mL/min signify     possible Chronic Kidney Disease.  MAGNESIUM     Status: None   Collection Time    01/18/13  1:20 AM      Result Value Range   Magnesium 2.1  1.5 - 2.5 mg/dL  PHOSPHORUS     Status: None   Collection Time    01/18/13  1:20 AM      Result Value Range   Phosphorus 2.6  2.3 - 4.6 mg/dL  AMYLASE     Status: None   Collection Time    01/18/13  1:20 AM      Result Value Range   Amylase 80  0 - 105 U/L  LIPASE, BLOOD     Status: None   Collection Time    01/18/13  1:20 AM      Result Value Range   Lipase 46  11 - 59 U/L  TROPONIN I     Status: None   Collection Time    01/18/13  1:20 AM      Result Value Range   Troponin I <0.30  <0.30 ng/mL   Comment:            Due to the release kinetics of cTnI,     a negative result within the first hours     of the onset of symptoms does not rule out     myocardial infarction with certainty.     If myocardial infarction is still suspected,     repeat the test at appropriate intervals.  LACTIC ACID, PLASMA     Status: None   Collection Time    01/18/13  1:20 AM      Result Value Range   Lactic Acid, Venous 1.5  0.5 - 2.2 mmol/L  PRO  B NATRIURETIC PEPTIDE     Status: Abnormal   Collection Time    01/18/13  1:20 AM      Result Value Range   Pro B Natriuretic peptide (BNP) 160.3 (*) 0 - 125 pg/mL  CBC WITH DIFFERENTIAL     Status: None   Collection Time    01/18/13  1:20 AM      Result Value Range   WBC 8.7  4.0 - 10.5 K/uL   RBC 3.99  3.87 - 5.11 MIL/uL   Hemoglobin 12.2  12.0 - 15.0 g/dL   HCT 16.1  09.6 - 04.5 %   MCV 90.2  78.0  - 100.0 fL   MCH 30.6  26.0 - 34.0 pg   MCHC 33.9  30.0 - 36.0 g/dL   RDW 40.9  81.1 - 91.4 %   Platelets 300  150 - 400 K/uL   Neutrophils Relative % 63  43 - 77 %   Neutro Abs 5.5  1.7 - 7.7 K/uL   Lymphocytes Relative 30  12 - 46 %   Lymphs Abs 2.6  0.7 - 4.0 K/uL   Monocytes Relative 5  3 - 12 %   Monocytes Absolute 0.5  0.1 - 1.0 K/uL   Eosinophils Relative 1  0 - 5 %   Eosinophils Absolute 0.1  0.0 - 0.7 K/uL   Basophils Relative 1  0 - 1 %   Basophils Absolute 0.0  0.0 - 0.1 K/uL  PROTIME-INR     Status: Abnormal   Collection Time    01/18/13  1:20 AM      Result Value Range   Prothrombin Time 24.0 (*) 11.6 - 15.2 seconds   INR 2.26 (*) 0.00 - 1.49  APTT     Status: Abnormal   Collection Time    01/18/13  1:20 AM      Result Value Range   aPTT 49 (*) 24 - 37 seconds   Comment:            IF BASELINE aPTT IS ELEVATED,     SUGGEST PATIENT RISK ASSESSMENT     BE USED TO DETERMINE APPROPRIATE     ANTICOAGULANT THERAPY.  D-DIMER, QUANTITATIVE     Status: None   Collection Time    01/18/13  1:20 AM      Result Value Range   D-Dimer, Quant 0.44  0.00 - 0.48 ug/mL-FEU   Comment:            AT THE INHOUSE ESTABLISHED CUTOFF     VALUE OF 0.48 ug/mL FEU,     THIS ASSAY HAS BEEN DOCUMENTED     IN THE LITERATURE TO HAVE     A SENSITIVITY AND NEGATIVE     PREDICTIVE VALUE OF AT LEAST     98 TO 99%.  THE TEST RESULT     SHOULD BE CORRELATED WITH     AN ASSESSMENT OF THE CLINICAL     PROBABILITY OF DVT / VTE.  TYPE AND SCREEN     Status: None   Collection Time    01/18/13  1:20 AM      Result Value Range   ABO/RH(D) O POS     Antibody Screen NEG     Sample Expiration 01/21/2013    PROCALCITONIN     Status: None   Collection Time    01/18/13  2:52 AM      Result Value Range   Procalcitonin <0.10     Comment:  Interpretation:     PCT (Procalcitonin) <= 0.5 ng/mL:     Systemic infection (sepsis) is not likely.     Local bacterial infection is possible.      (NOTE)             ICU PCT Algorithm               Non ICU PCT Algorithm        ----------------------------     ------------------------------             PCT < 0.25 ng/mL                 PCT < 0.1 ng/mL         Stopping of antibiotics            Stopping of antibiotics           strongly encouraged.               strongly encouraged.        ----------------------------     ------------------------------           PCT level decrease by               PCT < 0.25 ng/mL           >= 80% from peak PCT           OR PCT 0.25 - 0.5 ng/mL          Stopping of antibiotics                                                 encouraged.         Stopping of antibiotics               encouraged.        ----------------------------     ------------------------------           PCT level decrease by              PCT >= 0.25 ng/mL           < 80% from peak PCT            AND PCT >= 0.5 ng/mL            Continuing antibiotics                                                  encouraged.           Continuing antibiotics                encouraged.        ----------------------------     ------------------------------         PCT level increase compared          PCT > 0.5 ng/mL             with peak PCT AND              PCT >= 0.5 ng/mL             Escalation of antibiotics  strongly encouraged.          Escalation of antibiotics            strongly encouraged.  MRSA PCR SCREENING     Status: None   Collection Time    01/18/13  4:31 AM      Result Value Range   MRSA by PCR NEGATIVE  NEGATIVE   Comment:            The GeneXpert MRSA Assay (FDA     approved for NASAL specimens     only), is one component of a     comprehensive MRSA colonization     surveillance program. It is not     intended to diagnose MRSA     infection nor to guide or     monitor treatment for     MRSA infections.    Dg Chest Port 1 View  01/18/2013   *RADIOLOGY REPORT*  Clinical  Data: Hemoptysis.  Shortness of breath.  PORTABLE CHEST - 1 VIEW  Comparison: Chest x-ray chest x-ray 11/23/2012.  Findings: Elevation of the right hemidiaphragm and blunting of the right costophrenic sulcus, similar to the prior study, favored to reflect chronic pleural parenchymal scarring.  Postoperative changes of right upper lobectomy.  Left lung is clear.  No evidence of pulmonary edema.  Heart size is within normal limits. The patient is rotated to the right on today's exam, resulting in distortion of the mediastinal contours and reduced diagnostic sensitivity and specificity for mediastinal pathology.  An electronic device projects over the heart, likely to represent a leadless pacemaker device in the right ventricle.  IMPRESSION: 1.  No radiographic evidence of acute cardiopulmonary disease. 2.  Postoperative changes right upper lobectomy with some mild chronic pleural parenchymal scarring in the base of the right hemithorax, similar to prior studies.   Original Report Authenticated By: Trudie Reed, M.D.    Review of Systems  Constitutional: Negative for fever, chills and weight loss.  Respiratory: Positive for cough, hemoptysis and wheezing.   Cardiovascular: Negative for chest pain, palpitations and orthopnea.   Blood pressure 102/38, pulse 66, temperature 98.4 F (36.9 C), temperature source Oral, resp. rate 19, height 5\' 2"  (1.575 m), weight 152 lb 1.9 oz (69 kg), SpO2 98.00%. Physical Exam  Vitals reviewed. Constitutional: She is oriented to person, place, and time. She appears well-developed and well-nourished. No distress.  HENT:  Head: Normocephalic and atraumatic.  Eyes: EOM are normal. Pupils are equal, round, and reactive to light.  Neck: Neck supple. No thyromegaly present.  Cardiovascular: Normal rate, regular rhythm and normal heart sounds.   Respiratory: Effort normal and breath sounds normal. She has no wheezes. She has no rales.  GI: Soft. There is no tenderness.   Musculoskeletal: She exhibits no edema.  Lymphadenopathy:    She has no cervical adenopathy.  Neurological: She is alert and oriented to person, place, and time. No cranial nerve deficit.  Skin: Skin is warm and dry.    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 She presents with hemoptysis. She has been on Xarelto for atrial fibrillation. She is currently stable with good BP i SR and 100 % saturated on 2L [place].  CT of the chest shows no evidence of recurrence and no clear source of hemoptysis.  Xarelto needs to be stopped, as it has been.   Observe closely. May need a bronchoscopy at some point  Danaye Sobh C 01/18/2013, 8:57 AM

## 2013-01-18 NOTE — Progress Notes (Signed)
Patient c/o difficulty breathing.  RT called and to come and give patient a breathing treatment. RN stayed with patient and reassured patient.  Patient calmed down and difficulty breathing resolved prior to RT arriving on floor to see patient. Patient stated that she has anxiety sometimes, but she is okay now.

## 2013-01-18 NOTE — Progress Notes (Signed)
PULMONARY  / CRITICAL CARE MEDICINE  Name: Nancy Hodges MRN: [id number] DOB: 1945-09-26    ADMISSION DATE:  01/18/2013 CONSULTATION DATE:  01/18/2013  REFERRING MD :  Maryruth Bun EDP PRIMARY SERVICE:  PCCM  CHIEF COMPLAINT:  Hemoptysis  BRIEF PATIENT DESCRIPTION: 67 yo with past medical history of AF on Xarelto, recent RU lobectomy (Hendrickson) and chronic cough since spring brought to [facility] ED with complaints of hemoptysis.  Transferred to [facility] for further management.  Reports chronic cough since returning home after surgery.  There was no significant dyspnea, fever or secretions.  On the day of the admission felt blood in her mouth after coughing paroxysm.  Denies epistaxis, hematemesis or bleeding form any other source. Patient was an International aid/development worker for a living.  From [place], [place].  Patient reports 75-80 pack year history of smoking, quit 2000, Pre-op FEV 1 81%  SIGNIFICANT EVENTS / STUDIES:  6/16 Chest CT [facility]) >>> RLL / RML airspace disease / blood, multiple nonspecific pulmonary nodules, s/p RU lobectomy, cholelithiasis without cholecystitis   LINES / TUBES:  CULTURES:  ANTIBIOTICS: Levaquin 6/27 >>>         INTERVAL HISTORY:  VITAL SIGNS: Temp:  [98.6 F (37 C)] 98.6 F (37 C) (06/17 0049) Pulse Rate:  [64-95] 64 (06/17 0600) Resp:  [16-27] 18 (06/17 0600) BP: (101-173)/(35-62) 105/35 mmHg (06/17 0600) SpO2:  [93 %-100 %] 100 % (06/17 0600) Weight:  [69 kg (152 lb 1.9 oz)] 69 kg (152 lb 1.9 oz) (06/17 0130)  HEMODYNAMICS:   VENTILATOR SETTINGS:   INTAKE / OUTPUT: Intake/Output     06/16 0701 - 06/17 0700 06/17 0701 - 06/18 0700   I.V. (mL/kg) 41.3 (0.6)    IV Piggyback 150    Total Intake(mL/kg) 191.3 (2.8)    Net +191.3          Emesis Occurrence 1 x     PHYSICAL EXAMINATION: General:  Fatigued appearing white female in NAD Neuro:  Awake, alert, oriented, no focal defecits of gross neuro exam HEENT:  PERRL, no blood noted oropharynx,  nares, no palpable cervical adenopathy  Cardiovascular:  RRR, no m/r/g, 2+ DP pulses bilaterally Lungs:  Bilateral diminished air entry, expiratory wheezes R lung, no evidence of clubbing Abdomen:  BS+, abdomen soft, nontender Musculoskeletal:  Moves all extremities, no edema Skin:  Intact  LABS:  Recent Labs Lab 01/18/13 0120 01/18/13 0252  HGB 12.2  --   WBC 8.7  --   PLT 300  --   NA 139  --   K 4.0  --   CL 104  --   CO2 27  --   GLUCOSE 121*  --   BUN 16  --   CREATININE 0.66  --   CALCIUM 9.6  --   MG 2.1  --   PHOS 2.6  --   AST 41*  --   ALT 48*  --   ALKPHOS 100  --   BILITOT 0.3  --   PROT 7.4  --   ALBUMIN 3.6  --   APTT 49*  --   INR 2.26*  --   LATICACIDVEN 1.5  --   TROPONINI <0.30  --   PROCALCITON  --  <0.10  PROBNP 160.3*  --    No results found for this basename: GLUCAP,  in the last 168 hours  CXR:  6/17 >>> Postop changes, otherwise nad  ASSESSMENT / PLAN:  PAF / tachy-brady syndrome on Xarelto Coagulopathy secondary to above  Chronic cough, possible reactive airway disease Possible bronchitis vs bronchopneumonia (CAP)- unlikely due normal wbc, and afebrile. Hemoptysis in above setting, suspected source R lung S/p RU lobectomy for stage IA NSCLCA  -->  Hold Xarelto, appreciate input from Dr. Johney Frame for restart date -->  Hemorrhage much improved, defer bscopy for now, CT reviewed with dr hendrickson, not impressive, favor aspirated blood -->  If life threatening hemorrhage, to IR for angio / embolization, presuming R bronchial artery source -->  CBC q12h -->  empirical Levaquin PO x 5ds -->cont tussionex to suppress cough -->  Albuterol / Atrovent / PRN -->  Supplemental oxygen PRN goal SpO2>92 -->  Continue Amiodarone, Cardizem, Claritin  -->  SCDs for DVT Px Patient okay for telemetry floor.  Will need an outpatient visit with pulmonary.  Plan discussed with pt & daughter  Terri Piedra  Care during the described time  interval was provided by me and/or other providers on the critical care team.  I have reviewed this patient's available data, including medical history, events of note, physical examination and test results as part of my evaluation  [facility] V.  [phone]  Pulmonary and Critical Care Medicine [facility] Pager: [phone]  01/18/2013, 8:16 AM

## 2013-01-19 ENCOUNTER — Inpatient Hospital Stay (HOSPITAL_COMMUNITY): Payer: Medicare Other

## 2013-01-19 ENCOUNTER — Encounter (HOSPITAL_COMMUNITY): Payer: Self-pay | Admitting: Physician Assistant

## 2013-01-19 ENCOUNTER — Other Ambulatory Visit: Payer: Self-pay

## 2013-01-19 DIAGNOSIS — C3490 Malignant neoplasm of unspecified part of unspecified bronchus or lung: Secondary | ICD-10-CM

## 2013-01-19 DIAGNOSIS — D381 Neoplasm of uncertain behavior of trachea, bronchus and lung: Secondary | ICD-10-CM

## 2013-01-19 LAB — CBC
HCT: 34 % — ABNORMAL LOW (ref 36.0–46.0)
Hemoglobin: 11.3 g/dL — ABNORMAL LOW (ref 12.0–15.0)
RBC: 3.7 MIL/uL — ABNORMAL LOW (ref 3.87–5.11)
WBC: 6.4 10*3/uL (ref 4.0–10.5)

## 2013-01-19 LAB — PROCALCITONIN: Procalcitonin: <0.1 ng/mL

## 2013-01-19 MED ORDER — SODIUM CHLORIDE 0.9 % IJ SOLN
3.0000 mL | Freq: Two times a day (BID) | INTRAMUSCULAR | Status: DC
  Administered 2013-01-19 – 2013-01-21 (×5): 3 mL via INTRAVENOUS

## 2013-01-19 NOTE — Progress Notes (Signed)
PULMONARY  / CRITICAL CARE MEDICINE  Name: Nancy Hodges MRN: [id number] DOB: 1946-01-11    ADMISSION DATE:  01/18/2013 CONSULTATION DATE:  01/18/2013  REFERRING MD :  Maryruth Bun EDP PRIMARY SERVICE:  PCCM  CHIEF COMPLAINT:  Hemoptysis  BRIEF PATIENT DESCRIPTION: 67 yo with past medical history of AF on Xarelto, recent RU lobectomy (Hendrickson) and chronic cough since spring brought to [facility] ED with complaints of hemoptysis.  Transferred to [facility] for further management.  Reports chronic cough since returning home after surgery.  There was no significant dyspnea, fever or secretions.  On the day of the admission felt blood in her mouth after coughing paroxysm.  Denies epistaxis, hematemesis or bleeding form any other source. 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  Patient reports 75-80 pack year history of smoking, quit 2000, Pre-op FEV 1 81%  SIGNIFICANT EVENTS / STUDIES:  6/16 Chest CT [facility]) >>> RLL / RML airspace disease / blood, multiple nonspecific pulmonary nodules, s/p RU lobectomy, cholelithiasis without cholecystitis   LINES / TUBES: PIV CULTURES:  ANTIBIOTICS: Levaquin 6/27 >>>       INTERVAL HISTORY: Less hemoptysis.  No chest pain.   Less dyspnea.    VITAL SIGNS: Temp:  [98 F (36.7 C)-98.8 F (37.1 C)] 98.2 F (36.8 C) (06/18 0528) Pulse Rate:  [72-80] 79 (06/18 0937) Resp:  [19-20] 19 (06/18 0937) BP: (90-122)/(50-69) 103/50 mmHg (06/18 0937) SpO2:  [93 %-99 %] 97 % (06/18 0937) Weight:  [68.448 kg (150 lb 14.4 oz)-68.856 kg (151 lb 12.8 oz)] 68.448 kg (150 lb 14.4 oz) (06/18 0528)    INTAKE / OUTPUT: Intake/Output     06/17 0701 - 06/18 0700 06/18 0701 - 06/19 0700   P.O. 940 240   I.V. (mL/kg) 20 (0.3) 3 (0)   IV Piggyback     Total Intake(mL/kg) 960 (14) 243 (3.6)   Urine (mL/kg/hr) 950 (0.6)    Total Output 950     Net +10 +243        Urine Occurrence 1 x     PHYSICAL EXAMINATION: General:  Fatigued  appearing white female in NAD Neuro:  Awake, alert, oriented, no focal defecits of gross neuro exam HEENT:  PERRL, no blood noted oropharynx, nares, no palpable cervical adenopathy  Cardiovascular:  RRR, no m/r/g, 2+ DP pulses bilaterally Lungs:  Bilateral diminished air entry, scattered exp wheeze on L  Abdomen:  BS+, abdomen soft, nontender Musculoskeletal:  Moves all extremities, no edema Skin:  Intact  LABS:  Recent Labs Lab 01/18/13 0120 01/18/13 0252 01/18/13 0812 01/18/13 1407 01/18/13 2041 01/19/13 0810  HGB 12.2  --   --   --  11.2* 11.3*  WBC 8.7  --   --   --  7.4 6.4  PLT 300  --   --   --  282 268  NA 139  --   --   --   --   --   K 4.0  --   --   --   --   --   CL 104  --   --   --   --   --   CO2 27  --   --   --   --   --   GLUCOSE 121*  --   --   --   --   --   BUN 16  --   --   --   --   --  CREATININE 0.66  --   --   --   --   --   CALCIUM 9.6  --   --   --   --   --   MG 2.1  --   --   --   --   --   PHOS 2.6  --   --   --   --   --   AST 41*  --   --   --   --   --   ALT 48*  --   --   --   --   --   ALKPHOS 100  --   --   --   --   --   BILITOT 0.3  --   --   --   --   --   PROT 7.4  --   --   --   --   --   ALBUMIN 3.6  --   --   --   --   --   APTT 49*  --   --   --   --   --   INR 2.26*  --   --   --   --   --   LATICACIDVEN 1.5  --   --   --   --   --   TROPONINI <0.30  --  <0.30 <0.30  --   --   PROCALCITON  --  <0.10  --   --   --   --   PROBNP 160.3*  --   --   --   --   --    No results found for this basename: GLUCAP,  in the last 168 hours  CXR: No film 6/18  ASSESSMENT / PLAN:  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 Hemoptysis in above setting, suspected source R lung S/p RU lobectomy for stage IA NSCLCA  -->  Cont to Hold Xarelto -->  Hemorrhage much improved,  defer bscopy for now, CT reviewed with dr hendrickson, not impressive, favor aspirated blood>>if rebleeds off Xarelto will need to do FOB -->  If life threatening hemorrhage, to IR for angio / embolization, presuming R bronchial artery source --> cont  empirical Levaquin PO x 5ds -->cont tussionex to suppress cough -->  Cont Albuterol / Atrovent / PRN -->  Supplemental oxygen PRN goal SpO2>92 -->  Continue Amiodarone, Cardizem, Claritin  -->  SCDs for DVT Px Patient okay for telemetry floor.  Will need an outpatient visit with pulmonary.   Dorcas Carrow Beeper  [phone]  Cell  [phone]  If no response or cell goes to voicemail, call beeper [phone]  Pulmonary and Critical Care Medicine [facility] Pager: [phone]  01/19/2013, 10:10 AM

## 2013-01-19 NOTE — Progress Notes (Signed)
Feels better  Still has a cough and is coughing up very small amounts of old blood  BP 104/41  Pulse 72  Temp(Src) 98.1 F (36.7 C) (Oral)  Resp 20  Ht 5\' 2"  (1.575 m)  Wt 150 lb 14.4 oz (68.448 kg)  BMI 27.59 kg/m2  SpO2 97%  Xarelto on hold, on empiric antibiotics  Agree with plan outlined by Dr. Delford Field

## 2013-01-19 NOTE — Progress Notes (Signed)
UR COMPLETED  

## 2013-01-19 NOTE — Progress Notes (Signed)
Pt O4x. Cough up small amount of blood. Pt states amount has been decreasing over the past few days. Pt Heart Block 1st degree PR .22 this AM.

## 2013-01-20 LAB — BASIC METABOLIC PANEL
BUN: 15 mg/dL (ref 6–23)
CO2: 25 mEq/L (ref 19–32)
Calcium: 9.3 mg/dL (ref 8.4–10.5)
GFR calc non Af Amer: 84 mL/min — ABNORMAL LOW (ref >90)
Glucose, Bld: 95 mg/dL (ref 70–99)
Potassium: 3.9 mEq/L (ref 3.5–5.1)
Sodium: 139 mEq/L (ref 135–145)

## 2013-01-20 LAB — CBC
HCT: 34.5 % — ABNORMAL LOW (ref 36.0–46.0)
Hemoglobin: 11.4 g/dL — ABNORMAL LOW (ref 12.0–15.0)
MCH: 30.4 pg (ref 26.0–34.0)
MCHC: 33 g/dL (ref 30.0–36.0)
RBC: 3.75 MIL/uL — ABNORMAL LOW (ref 3.87–5.11)

## 2013-01-20 NOTE — Progress Notes (Signed)
Pt cough put small dark blood septum. Pt no complaints. O4x

## 2013-01-20 NOTE — Progress Notes (Signed)
PT ambulated  in hall, self.

## 2013-01-20 NOTE — Plan of Care (Signed)
Problem: Phase I Progression Outcomes Goal: Pain controlled with appropriate interventions Outcome: Completed/Met Date Met:  01/20/13 Patient continues to have cough with small yellow/red tinged mucous.  No complaints of shortness of breath.  Right side chest discomfort relieved with breathing treatments.  No other complications.  Will continue to monitor.

## 2013-01-20 NOTE — Progress Notes (Signed)
PULMONARY  / CRITICAL CARE MEDICINE  Name: Nancy Hodges MRN: [id number] DOB: 10-05-1945    ADMISSION DATE:  01/18/2013 CONSULTATION DATE:  01/18/2013  REFERRING MD :  Maryruth Bun EDP PRIMARY SERVICE:  PCCM  CHIEF COMPLAINT:  Hemoptysis  BRIEF PATIENT DESCRIPTION: 67 yo with past medical history of AF on Xarelto, recent RU lobectomy (Hendrickson) and chronic cough since spring brought to [facility] ED with complaints of hemoptysis.  Transferred to [facility] for further management.  Reports chronic cough since returning home after surgery.  There was no significant dyspnea, fever or secretions.  On the day of the admission felt blood in her mouth after coughing paroxysm.  Denies epistaxis, hematemesis or bleeding form any other source. Patient was an International aid/development worker for a living.  From [place], [place].  Patient reports 75-80 pack year history of smoking, quit 2000, Pre-op FEV 1 81%  SIGNIFICANT EVENTS / STUDIES:  6/16 Chest CT [facility]) >>> RLL / RML airspace disease / blood, multiple nonspecific pulmonary nodules, s/p RU lobectomy, cholelithiasis without cholecystitis   LINES / TUBES: PIV CULTURES:  ANTIBIOTICS: Levaquin 6/27 >>>       INTERVAL HISTORY: Less hemoptysis.  No chest pain.   Less dyspnea.    VITAL SIGNS: Temp:  [98.1 F (36.7 C)-98.7 F (37.1 C)] 98.7 F (37.1 C) (06/19 0532) Pulse Rate:  [72-79] 74 (06/19 0927) Resp:  [18-20] 18 (06/19 0927) BP: (97-126)/(41-67) 105/67 mmHg (06/19 0927) SpO2:  [94 %-97 %] 94 % (06/19 0927) Weight:  [67.631 kg (149 lb 1.6 oz)] 67.631 kg (149 lb 1.6 oz) (06/19 0532)    INTAKE / OUTPUT: Intake/Output     06/18 0701 - 06/19 0700 06/19 0701 - 06/20 0700   P.O. 940 240   I.V. (mL/kg) 3 (0)    Total Intake(mL/kg) 943 (13.9) 240 (3.5)   Urine (mL/kg/hr) 2750 (1.7)    Total Output 2750     Net -1807 +240         PHYSICAL EXAMINATION: General:  Fatigued appearing white female in NAD Neuro:  Awake, alert, oriented, no focal defecits  of gross neuro exam HEENT:  PERRL, no blood noted oropharynx, nares, no palpable cervical adenopathy  Cardiovascular:  RRR, no m/r/g, 2+ DP pulses bilaterally Lungs:  Bilateral diminished air entry,  Abdomen:  BS+, abdomen soft, nontender Musculoskeletal:  Moves all extremities, no edema Skin:  Intact  LABS:  Recent Labs Lab 01/18/13 0120 01/18/13 0252 01/18/13 0812 01/18/13 1407 01/18/13 2041 01/19/13 0810 01/20/13 0505  HGB 12.2  --   --   --  11.2* 11.3* 11.4*  WBC 8.7  --   --   --  7.4 6.4 6.9  PLT 300  --   --   --  282 268 266  NA 139  --   --   --   --   --  139  K 4.0  --   --   --   --   --  3.9  CL 104  --   --   --   --   --  103  CO2 27  --   --   --   --   --  25  GLUCOSE 121*  --   --   --   --   --  95  BUN 16  --   --   --   --   --  15  CREATININE 0.66  --   --   --   --   --  0.76  CALCIUM 9.6  --   --   --   --   --  9.3  MG 2.1  --   --   --   --   --   --   PHOS 2.6  --   --   --   --   --   --   AST 41*  --   --   --   --   --   --   ALT 48*  --   --   --   --   --   --   ALKPHOS 100  --   --   --   --   --   --   BILITOT 0.3  --   --   --   --   --   --   PROT 7.4  --   --   --   --   --   --   ALBUMIN 3.6  --   --   --   --   --   --   APTT 49*  --   --   --   --   --   --   INR 2.26*  --   --   --   --   --   --   LATICACIDVEN 1.5  --   --   --   --   --   --   TROPONINI <0.30  --  <0.30 <0.30  --   --   --   PROCALCITON  --  <0.10  --   --   --  <0.10  --   PROBNP 160.3*  --   --   --   --   --   --    No results found for this basename: GLUCAP,  in the last 168 hours  CXR: 6/19 : clear  ASSESSMENT / PLAN:  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 Hemoptysis in above setting, suspected source R lung S/p RU lobectomy for stage IA NSCLCA  -->  Cont to Hold Xarelto -->   Hemorrhage much improved, defer bscopy for now, CT reviewed with dr hendrickson, not impressive, favor aspirated blood>>if rebleeds off Xarelto will need to do FOB --> cont  empirical Levaquin PO x 5ds -->cont tussionex to suppress cough -->  Cont Albuterol / Atrovent / PRN>>>change to Symbicort  -->  Supplemental oxygen PRN goal SpO2>92 -->  Continue Amiodarone, Cardizem, Claritin  -->  SCDs for DVT Px Patient okay for telemetry floor.  Will need an outpatient visit with pulmonary.  --> 6-19 2 episodes of small hemoptyses, will watch for 24 hours then possible dc. Complete 7 days of abx. Will need outpt pulm and cards f/u Plan d/c 6/20  [facility] Minor ACNP Adolph Pollack PCCM Pager [phone] till 3 pm If no answer page [phone] 01/20/2013, 9:34 AM

## 2013-01-21 MED ORDER — LEVOFLOXACIN 500 MG PO TABS: ORAL_TABLET | ORAL | Status: DC

## 2013-01-21 MED ORDER — GUAIFENESIN-DM 100-10 MG/5ML PO SYRP: 5.0000 mL | ORAL_SOLUTION | Freq: Three times a day (TID) | ORAL | Status: DC | PRN

## 2013-01-21 MED ORDER — BUDESONIDE-FORMOTEROL FUMARATE 160-4.5 MCG/ACT IN AERO: 2.0000 | INHALATION_SPRAY | Freq: Two times a day (BID) | RESPIRATORY_TRACT | Status: DC

## 2013-01-21 NOTE — Discharge Summary (Signed)
Physician Discharge Summary  Patient ID: Nancy Hodges MRN: [id number] DOB/AGE: [birth year] 69 y.o.  Admit date: 01/18/2013 Discharge date: 01/21/2013  Problem List Active Problems:   Tachycardia-bradycardia syndrome   Non-small cell carcinoma of right lung, stage 1   Hemoptysis   Coagulopathy   CAP (community acquired pneumonia)  HPI: 67 yo with past medical history of AF on Xarelto, recent RU lobectomy (Hendrickson) and chronic cough brought to [facility] ED with complaints of hemoptysis. Transferred to [facility] for further management. Reports chronic cough since returning home after surgery. There was no significant dyspnea, fever or secretions. On the day of the admission felt blood in her mouth after coughinh paroxysm. Denies epistaxis, hematemesis or bleeding form any other source  Hospital Course:  SIGNIFICANT EVENTS / STUDIES:  6/16 Chest CT [facility]) >>> RLL / RML airspace disease / blood, multiple nonspecific pulmonary nodules, s/p RU lobectomy, cholelithiasis without cholecystitis  LINES / TUBES:  PIV  CULTURES:  ANTIBIOTICS:  Levaquin 6/17 >>> 6-20  ASSESSMENT / PLAN:  PAF / tachy-brady syndrome on Xarelto  Coagulopathy secondary to above  Chronic cough, possible reactive airway disease  Possible bronchitis vs bronchopneumonia (CAP)- unlikely due normal wbc, and afebrile.  Hemoptysis in above setting, suspected source R lung  S/p RU lobectomy for stage IA NSCLCA  --> Cont to Hold Xarelto  --> Hemorrhage much improved, defer bscopy for now, CT reviewed with dr hendrickson, not impressive, favor aspirated blood>>if rebleeds off Xarelto will need to do FOB  --> cont empirical Levaquin PO x 4 more days  -->cont  to suppress cough  --> Cont Albuterol / Atrovent / PRN>>>change to Symbicort  --> Supplemental oxygen PRN goal SpO2>92  --> Continue Amiodarone, Cardizem, Claritin  --> SCDs for DVT Px  Patient okay for telemetry floor. Will need an outpatient visit with  pulmonary.  --> 6-19 2 episodes of small hemoptyses, will watch for 24 hours then possible dc. Complete 7 days of abx.  Will need outpt pulm and cards f/u. Appointment have been set up.     Labs at discharge Lab Results  Component Value Date   CREATININE 0.76 01/20/2013   BUN 15 01/20/2013   NA 139 01/20/2013   K 3.9 01/20/2013   CL 103 01/20/2013   CO2 25 01/20/2013   Lab Results  Component Value Date   WBC 6.9 01/20/2013   HGB 11.4* 01/20/2013   HCT 34.5* 01/20/2013   MCV 92.0 01/20/2013   PLT 266 01/20/2013   Lab Results  Component Value Date   ALT 48* 01/18/2013   AST 41* 01/18/2013   ALKPHOS 100 01/18/2013   BILITOT 0.3 01/18/2013   Lab Results  Component Value Date   INR 2.26* 01/18/2013   INR 1.04 10/25/2012   INR 1.01 09/14/2012    Current radiology studies Dg Chest 2 View  01/19/2013   *RADIOLOGY REPORT*  Clinical Data: Weakness and hemoptysis  CHEST - 2 VIEW  Comparison: 01/18/2013  Findings: Postsurgical changes are again noted on the right. Cardiac shadow is stable.  The lungs are clear.  No sizable effusion is noted.  Stable device overlying the right heart is noted.  IMPRESSION: No acute abnormality is seen.   Original Report Authenticated By: Alcide Clever, M.D.    Disposition:  06-Home-Health Care Svc      Discharge Orders   Future Appointments Provider Department Dept Phone   02/08/2013 12:20 PM [facility] [phone]   Patient to arrive 14  minutes prior to appointment time.   02/08/2013 2:00 PM Loreli Slot, MD [facility] [phone]   02/11/2013 10:30 AM Storm Frisk, MD [facility] [phone]   03/30/2013 10:45 AM Hillis Range, MD [facility] Main Office [place]) [phone]   03/30/2013 10:45 AM [facility] Nurse [facility] [phone]   Future Orders Complete By Expires     Discharge patient  As directed          Medication List    TAKE these medications       acetaminophen 500 MG tablet  Commonly known as:  TYLENOL  Take 500 mg by mouth every 6 (six) hours as needed for pain.     amiodarone 200 MG tablet  Commonly known as:  PACERONE  Take 1 tablet (200 mg total) by mouth daily.     budesonide-formoterol 160-4.5 MCG/ACT inhaler  Commonly known as:  SYMBICORT  Inhale 2 puffs into the lungs 2 (two) times daily.     diltiazem 240 MG 24 hr capsule  Commonly known as:  CARDIZEM CD  Take 240 mg by mouth daily.     fluticasone 50 MCG/ACT nasal spray  Commonly known as:  FLONASE  Place 2 sprays into the nose daily as needed (for congestion).     guaiFENesin-dextromethorphan 100-10 MG/5ML syrup  Commonly known as:  ROBITUSSIN DM  Take 5 mLs by mouth 3 (three) times daily as needed for cough.     levofloxacin 500 MG tablet  Commonly known as:  LEVAQUIN  Take for 4 more days     loratadine 10 MG tablet  Commonly known as:  CLARITIN  Take 10 mg by mouth daily.       Follow-up Information   Follow up with Shan Levans, MD On 02/11/2013. (10:30 am)    Contact information:   [address] [place] [address] [phone]        Discharged Condition: good  Time spent on discharge greater than 40 minutes.  Vital signs at Discharge. Temp:  [98 F (36.7 C)-98.6 F (37 C)] 98 F (36.7 C) (06/20 0541) Pulse Rate:  [72-73] 72 (06/20 0541) Resp:  [18] 18 (06/20 0541) BP: (98-122)/(42-61) 106/42 mmHg (06/20 0912) SpO2:  [93 %-95 %] 93 % (06/20 0541) Weight:  [68.13 kg (150 lb 3.2 oz)] 68.13 kg (150 lb 3.2 oz) (06/20 0541) Office follow up Special Information or instructions. She will follow up with Dr. Delford Field. Her hemoptysis has resolved at time of discharge.  Signed: Brett Canales Minor ACNP Adolph Pollack PCCM Pager [phone] till 3 pm If no answer page [phone] 01/21/2013, 9:47 AM  Pt seen and examined with Minor and I agree with the above note Caryl Bis   [phone]  Cell  [phone]  If no response or cell goes to voicemail, call beeper [phone]

## 2013-01-22 ENCOUNTER — Telehealth: Payer: Self-pay | Admitting: Internal Medicine

## 2013-01-22 DIAGNOSIS — J449 Chronic obstructive pulmonary disease, unspecified: Secondary | ICD-10-CM

## 2013-01-22 NOTE — Telephone Encounter (Signed)
Daughter called and patient called  Patient sent home after change of combivent neb to symbicort but no albuterol prn Daughter and patient upset about this fact Daughter had to come from 1h away and give neb machine 01/22/2013 because patient very dyspneic Now after neb, patient better  Rx Daughter to stay at home with patient tonight with daughter;s neb machine to give albuterol prn Continue to use q2h prn albuterol neb but I will tell triage to send this on 01/24/13 AM (Did not send neb albuterol order) If worse, go to ER DME order for neb machine sent   Given albuterol mdi order  - [facility] in [place], [place]  - Pharmacy Phone  - [phone]   Dr. Kalman Shan, M.D., F.C.C.P Pulmonary and Critical Care Medicine Staff Physician Portal System [facility] Pager: [phone], If no answer or between  15:00h - 7:00h: call [phone]  01/22/2013 7:30 PM

## 2013-01-24 ENCOUNTER — Telehealth: Payer: Self-pay | Admitting: Critical Care Medicine

## 2013-01-24 MED ORDER — ALBUTEROL SULFATE (2.5 MG/3ML) 0.083% IN NEBU: 2.5000 mg | INHALATION_SOLUTION | RESPIRATORY_TRACT | Status: DC | PRN

## 2013-01-24 NOTE — Telephone Encounter (Signed)
I spoke with the pt and neb rx has been sent to [facility] in [place]. Carron Curie, CMA

## 2013-01-24 NOTE — Telephone Encounter (Signed)
I spoke with the pt and advised that the order was sent for nebulizer machine over the weekend and that we are working on sending this to a DME company. Pt has daughters neb machine at this time. I also advised that PW is not in the office on the AM of 02-08-13 so the pt states she will keep appt with PW on 02-11-13. Carron Curie, CMA

## 2013-01-24 NOTE — Telephone Encounter (Signed)
(  continued) out of town.  Can pt be worked in anywhere since she is coming out of town & would prefer not to have appts on separate days.  [name]

## 2013-02-08 ENCOUNTER — Ambulatory Visit (INDEPENDENT_AMBULATORY_CARE_PROVIDER_SITE_OTHER): Payer: Medicare Other | Admitting: Thoracic Surgery (Cardiothoracic Vascular Surgery)

## 2013-02-08 ENCOUNTER — Ambulatory Visit
Admit: 2013-02-08 | Discharge: 2013-02-08 | Disposition: A | Discharge status: Home / Self Care | Payer: Medicare Other | Attending: Thoracic Surgery (Cardiothoracic Vascular Surgery) | Admitting: Thoracic Surgery (Cardiothoracic Vascular Surgery)

## 2013-02-08 ENCOUNTER — Encounter: Payer: Self-pay | Admitting: Thoracic Surgery (Cardiothoracic Vascular Surgery)

## 2013-02-08 DIAGNOSIS — Z712 Person consulting for explanation of examination or test findings: Secondary | ICD-10-CM

## 2013-02-08 DIAGNOSIS — D381 Neoplasm of uncertain behavior of trachea, bronchus and lung: Secondary | ICD-10-CM

## 2013-02-08 DIAGNOSIS — Z902 Acquired absence of lung [part of]: Secondary | ICD-10-CM

## 2013-02-08 DIAGNOSIS — Z9889 Other specified postprocedural states: Secondary | ICD-10-CM

## 2013-02-08 DIAGNOSIS — C3410 Malignant neoplasm of upper lobe, unspecified bronchus or lung: Secondary | ICD-10-CM

## 2013-02-08 DIAGNOSIS — Z7189 Other specified counseling: Secondary | ICD-10-CM

## 2013-02-08 NOTE — Progress Notes (Signed)
HPI:  Nancy Hodges returns today for a scheduled for month followup visit.  She is a 67 year old woman who had an incidentally noted lung nodule on chest x-ray when she was having a pacemaker placed for tachycardia-bradycardia syndrome. CT confirmed a right lung mass. She underwent a thoracoscopic right upper lobectomy in March for what turned out to be a stage IB non-small cell carcinoma.  She was readmitted in June with hemoptysis. She had been having a great deal of coughing and then began coughing up blood. She was on Xarelto. That was discontinued. She did have a CT of more had which showed some blood in the lung parenchyma, but no evidence of recurrence. Since that episode she's not had any further hemoptysis. Her cough resolved after she was started on a bronchodilator. Her weight has been stable. She feels well.  Past Medical History  Diagnosis Date  . Asthma   . History of sinusitis   . Allergic rhinitis   . History of kidney stones   . 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 H/O hiatal hernia   . GERD (gastroesophageal reflux disease)   . Pulmonary nodule   . Paroxysmal atrial fibrillation     CHADS score: 0  . Atrial flutter   . Tachycardia-bradycardia syndrome     a. 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  [name] Anxiety   . Headache(784.0)     sinus  . Lung cancer     a. Stage IB non-small cell carcinoma, s/p R VATS, wedge resection, RU lobectomy 10/2012.      Current Outpatient Prescriptions  Medication Sig Dispense Refill  . acetaminophen (TYLENOL) 500 MG tablet Take 500 mg by mouth every 6 (six) hours as needed for pain.      [name] albuterol (PROVENTIL) (2.5 MG/3ML) 0.083% nebulizer solution Take 3 mLs (2.5 mg total) by nebulization every 2 (two) hours as needed for shortness of breath.  120 mL  0  . albuterol-ipratropium (COMBIVENT) 18-103 MCG/ACT inhaler Inhale 2 puffs into the lungs every 6 (six) hours as needed for wheezing.      [name] amiodarone  (PACERONE) 200 MG tablet Take 1 tablet (200 mg total) by mouth daily.  30 tablet  6  . budesonide-formoterol (SYMBICORT) 160-4.5 MCG/ACT inhaler Inhale 2 puffs into the lungs 2 (two) times daily.  1 Inhaler  12  . diltiazem (CARDIZEM CD) 240 MG 24 hr capsule Take 240 mg by mouth daily.      . fluticasone (FLONASE) 50 MCG/ACT nasal spray Place 2 sprays into the nose daily as needed (for congestion).      [name] guaiFENesin-dextromethorphan (ROBITUSSIN DM) 100-10 MG/5ML syrup Take 5 mLs by mouth 3 (three) times daily as needed for cough.  118 mL  0  . loratadine (CLARITIN) 10 MG tablet Take 10 mg by mouth daily.       No current facility-administered medications for this visit.   Review of systems  See history of present illness  All other systems are negative   Physical Exam BP 124/69  Pulse 69  Resp 16  Ht 5\' 2"  (1.575 m)  Wt 149 lb (67.586 kg)  BMI 27.25 kg/m2  SpO43 73% 67 year old woman in no acute distress Neurologic alert and oriented x3 with no focal deficits No cervical or suprapubic or adenopathy Cardiac regular rate and rhythm normal S1 and S2, 2/6 systolic murmur Lungs clear with equal breath sounds bilaterally Incisions well healed  Diagnostic Tests: CT of chest 02/08/2013 *RADIOLOGY REPORT*  Clinical Data: Right lung cancer.  CT CHEST WITHOUT CONTRAST  Technique: Multidetector CT imaging of the chest was performed  following the standard protocol without IV contrast.  Comparison: Multiple exams, including 10/12/2012 and 01/19/2013  Findings: No pathologic adenopathy observed. Postoperative  findings from right upper lobectomy. Atherosclerotic aortic arch  and coronary artery atherosclerosis noted.  A leadless pacemaker is present in the right ventricle. Gallstones  are present in the visualized portion the gallbladder. Several  punctate hepatic calcifications are most likely from old  granulomatous disease. No mass of the visualized adrenal glands.  Emphysema  noted. No worrisome new lung nodule or mass.  IMPRESSION:  1. No recurrent malignancy identified.  2. Atherosclerosis.  3. Cholelithiasis.  4. Leadless pacemaker in the right ventricle.  Original Report Authenticated By: Gaylyn Rong, M.D.   Impression: 67 year old woman who is now 4 months out from a thoracoscopic right upper lobectomy for a stage IB non-small cell carcinoma. She has no evidence of recurrent disease.  She did have an episode of hemoptysis back in June. She likely had a pneumonia at that time and was also on Xarelto then. She has not had any further hemoptysis since her blood thinner was discontinued and she was treated.  Plan: Return in 4 months with CT of chest

## 2013-02-11 ENCOUNTER — Encounter: Payer: Self-pay | Admitting: Critical Care Medicine

## 2013-02-11 ENCOUNTER — Ambulatory Visit (INDEPENDENT_AMBULATORY_CARE_PROVIDER_SITE_OTHER): Payer: Medicare Other | Admitting: Critical Care Medicine

## 2013-02-11 VITALS — BP 136/62 | HR 70 | Temp 98.4°F | Ht 62.0 in | Wt 154.4 lb

## 2013-02-11 DIAGNOSIS — R042 Hemoptysis: Secondary | ICD-10-CM

## 2013-02-11 DIAGNOSIS — J449 Chronic obstructive pulmonary disease, unspecified: Secondary | ICD-10-CM

## 2013-02-11 MED ORDER — RIVAROXABAN 20 MG PO TABS: 20.0000 mg | ORAL_TABLET | Freq: Every day | ORAL | Status: DC

## 2013-02-11 MED ORDER — BUDESONIDE-FORMOTEROL FUMARATE 160-4.5 MCG/ACT IN AERO: 2.0000 | INHALATION_SPRAY | Freq: Two times a day (BID) | RESPIRATORY_TRACT | Status: DC

## 2013-02-11 NOTE — Progress Notes (Signed)
Subjective:    Patient ID: Nancy Hodges, female    DOB: 09/13/1945, 67 y.o.   MRN: [id number]  HPI 67 y.o. F    Chief Complaint  Patient presents with  . HFU    Breathing has improved since discharge.  Does have runny nose and PND.  No whezing, chest tightness, chest pain, or cough.  Would like to discuss xarelto.  Hx of afib. Was on xarelto.  S/p recent RUL lobectomy per [facility].  Pt adm 6/17-6/20 for hemoptysis d/t bronchitis. Pt responded to ABX and steroids and pt d/c to home Since that time doing well. No further bleeding Pt denies any significant sore throat, nasal congestion or excess secretions, fever, chills, sweats, unintended weight loss, pleurtic or exertional chest pain, orthopnea PND, or leg swelling Pt denies any increase in rescue therapy over baseline, denies waking up needing it or having any early am or nocturnal exacerbations of coughing/wheezing/or dyspnea. Pt also denies any obvious fluctuation in symptoms with  weather or environmental change or other alleviating or aggravating factors     Review of Systems Constitutional:   No  weight loss, night sweats,  Fevers, chills, fatigue, lassitude. HEENT:   No headaches,  Difficulty swallowing,  Tooth/dental problems,  Sore throat,                No sneezing, itching, ear ache, +++nasal congestion, +++post nasal drip,   CV:  No chest pain,  Orthopnea, PND, swelling in lower extremities, anasarca, dizziness, palpitations  GI  No heartburn, indigestion, abdominal pain, nausea, vomiting, diarrhea, change in bowel habits, loss of appetite  Resp: No shortness of breath with exertion or at rest.  No excess mucus, no productive cough,  No non-productive cough,  No coughing up of blood.  No change in color of mucus.  No wheezing.  No chest wall deformity  Skin: no rash or lesions.  GU: no dysuria, change in color of urine, no urgency or frequency.  No flank pain.  MS:  No joint pain or swelling.  No decreased range of  motion.  No back pain.  Psych:  No change in mood or affect. No depression or anxiety.  No memory loss.     Objective:   Physical Exam Filed Vitals:   02/11/13 0933  BP: 136/62  Pulse: 70  Temp: 98.4 F (36.9 C)  TempSrc: Oral  Height: 5\' 2"  (1.575 m)  Weight: 154 lb 6.4 oz (70.035 kg)  SpO2: 97%    Gen: Pleasant, well-nourished, in no distress,  normal affect  ENT: No lesions,  mouth clear,  oropharynx clear, no postnasal drip  Neck: No JVD, no TMG, no carotid bruits  Lungs: No use of accessory muscles, no dullness to percussion, exp wheeze, improved airflow  Cardiovascular: RRR, heart sounds normal, no murmur or gallops, no peripheral edema  Abdomen: soft and NT, no HSM,  BS normal  Musculoskeletal: No deformities, no cyanosis or clubbing  Neuro: alert, non focal  Skin: Warm, no lesions or rashes  No results found.        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 acetaminophen (TYLENOL) 500 MG tablet Take 500 mg by mouth every 6 (six) hours as needed for pain.      [name] albuterol (PROVENTIL) (2.5 MG/3ML) 0.083% nebulizer solution Take 3 mLs (2.5 mg total) by nebulization every 2 (two) hours as needed for shortness of breath.  120 mL  0  . albuterol-ipratropium (COMBIVENT) 18-103 MCG/ACT inhaler Inhale 2 puffs into the lungs every 6 (six) hours as needed for wheezing.      [name] amiodarone (PACERONE) 200 MG tablet Take 1 tablet (200 mg total) by mouth daily.  30 tablet  6  . budesonide-formoterol (SYMBICORT) 160-4.5 MCG/ACT inhaler  Inhale 2 puffs into the lungs 2 (two) times daily.  1 Inhaler  12  . diltiazem (CARDIZEM CD) 240 MG 24 hr capsule Take 240 mg by mouth daily.      . fluticasone (FLONASE) 50 MCG/ACT nasal spray Place 2 sprays into the nose daily as needed (for congestion).      [name] guaiFENesin-dextromethorphan (ROBITUSSIN DM) 100-10 MG/5ML syrup Take 5 mLs by mouth 3 (three) times daily as needed for cough.  118 mL  0  . loratadine (CLARITIN) 10 MG tablet Take 10 mg by mouth daily.      . [DISCONTINUED] budesonide-formoterol (SYMBICORT) 160-4.5 MCG/ACT inhaler Inhale 2 puffs into the lungs 2 (two) times daily.  1 Inhaler  12  . Rivaroxaban (XARELTO) 20 MG TABS Take 1 tablet (20 mg total) by mouth daily.  30 tablet  11   No facility-administered encounter medications on file as of 02/11/2013.

## 2013-02-11 NOTE — Patient Instructions (Addendum)
Stay on symbicort two puff twice daily combivent as needed Resume xarelto 20mg  daily Resume normal activity Return November 2014

## 2013-02-12 NOTE — Assessment & Plan Note (Signed)
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

## 2013-02-12 NOTE — Assessment & Plan Note (Signed)
Hemoptysis has resolved d/t bronchitis Ok to resume xarelto

## 2013-03-11 ENCOUNTER — Telehealth: Payer: Self-pay | Admitting: Critical Care Medicine

## 2013-03-11 MED ORDER — BUDESONIDE-FORMOTEROL FUMARATE 160-4.5 MCG/ACT IN AERO: 2.0000 | INHALATION_SPRAY | Freq: Two times a day (BID) | RESPIRATORY_TRACT | Status: DC

## 2013-03-11 NOTE — Telephone Encounter (Signed)
Rx has been sent in. Pt is aware. 

## 2013-03-30 ENCOUNTER — Encounter: Payer: Self-pay | Admitting: Internal Medicine

## 2013-03-30 ENCOUNTER — Ambulatory Visit (INDEPENDENT_AMBULATORY_CARE_PROVIDER_SITE_OTHER): Payer: Medicaid Other | Admitting: Internal Medicine

## 2013-03-30 VITALS — BP 137/68 | HR 71 | Ht 62.0 in | Wt 155.0 lb

## 2013-03-30 DIAGNOSIS — I4891 Unspecified atrial fibrillation: Secondary | ICD-10-CM

## 2013-03-30 DIAGNOSIS — I480 Paroxysmal atrial fibrillation: Secondary | ICD-10-CM

## 2013-03-30 DIAGNOSIS — I495 Sick sinus syndrome: Secondary | ICD-10-CM

## 2013-03-30 MED ORDER — DILTIAZEM HCL ER COATED BEADS 240 MG PO CP24: 240.0000 mg | ORAL_CAPSULE | Freq: Every day | ORAL | Status: DC

## 2013-03-30 MED ORDER — AMIODARONE HCL 100 MG PO TABS: 100.0000 mg | ORAL_TABLET | Freq: Every day | ORAL | Status: DC

## 2013-03-30 NOTE — Patient Instructions (Addendum)
Your physician has recommended you make the following change in your medication: decrease Amiodarone to 100 mg daily  Your physician recommends that you return for lab work in: today  Your physician wants you to follow-up in: 6 months. You will receive a reminder letter in the mail two months in advance. If you don't receive a letter, please call our office to schedule the follow-up appointment.

## 2013-03-30 NOTE — Progress Notes (Signed)
Nancy Hodges is a 67 y.o. female who presents today for routine electrophysiology followup.  She is doing very well.  Today, she denies symptoms of chest pain, shortness of breath,  lower extremity edema, dizziness, presyncope, or syncope. She has only very rare palpitations. The patient is otherwise without complaint today.   Past Medical History  Diagnosis Date  . Asthma   . History of sinusitis   . Allergic rhinitis   . History of kidney stones   . 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 H/O hiatal hernia   . GERD (gastroesophageal reflux disease)   . Pulmonary nodule   . Paroxysmal atrial fibrillation     CHADS score: 0  . Atrial flutter   . Tachycardia-bradycardia syndrome     a. 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  [name] Anxiety   . Headache(784.0)     sinus  . Lung cancer     a. Stage IB non-small cell carcinoma, s/p R VATS, wedge resection, RU lobectomy 10/2012.   Past Surgical History  Procedure Laterality Date  . Partial hysterectomy    . Pacemaker insertion  09/14/2012    Nanostim (SJM) leadless pacemaker (LEADLESS II STUDY PATIENT) implanted by Dr Johney Frame  . Video assisted thoracoscopy (vats)/wedge resection Right 10/27/2012    Procedure: VIDEO ASSISTED THORACOSCOPY (VATS),RGHT UPPER LOBE LUNG WEDGE RESECTION;  Surgeon: Loreli Slot, MD;  Location: [facility] OR;  Service: Thoracic;  Laterality: Right;  . Lobectomy Right 10/27/2012    Procedure: LOBECTOMY;  Surgeon: Loreli Slot, MD;  Location: [facility] OR;  Service: Thoracic;  Laterality: Right;   RIGHT UPPER LOBECTOMY & Node Dissection    Current Outpatient Prescriptions  Medication Sig Dispense Refill  . acetaminophen (TYLENOL) 500 MG tablet Take 500 mg by mouth every 6 (six) hours as needed for pain.      [name] albuterol (PROVENTIL) (2.5 MG/3ML) 0.083% nebulizer solution Take 3 mLs (2.5 mg total) by nebulization every 2 (two) hours as needed for shortness of breath.  120 mL  0  .  albuterol-ipratropium (COMBIVENT) 18-103 MCG/ACT inhaler Inhale 2 puffs into the lungs every 6 (six) hours as needed for wheezing.      [name] amiodarone (PACERONE) 200 MG tablet Take 1 tablet (200 mg total) by mouth daily.  30 tablet  6  . budesonide-formoterol (SYMBICORT) 160-4.5 MCG/ACT inhaler Inhale 2 puffs into the lungs 2 (two) times daily.  1 Inhaler  12  . diltiazem (CARDIZEM CD) 240 MG 24 hr capsule Take 240 mg by mouth daily.      . fluticasone (FLONASE) 50 MCG/ACT nasal spray Place 2 sprays into the nose daily as needed (for congestion).      [name] guaiFENesin-dextromethorphan (ROBITUSSIN DM) 100-10 MG/5ML syrup Take 5 mLs by mouth 3 (three) times daily as needed for cough.  118 mL  0  . loratadine (CLARITIN) 10 MG tablet Take 10 mg by mouth daily.      . Rivaroxaban (XARELTO) 20 MG TABS Take 1 tablet (20 mg total) by mouth daily.  30 tablet  11   No current facility-administered medications for this visit.    Physical Exam: Filed Vitals:   03/30/13 1051  BP: 137/68  Pulse: 71  Height: 5\' 2"  (1.575 m)  Weight: 155 lb (70.308 kg)    GEN- The patient is well appearing, alert and oriented x 3 today.   Head- normocephalic, atraumatic Eyes-  Sclera clear, conjunctiva pink Ears- hearing intact Oropharynx- clear Lungs- Clear to ausculation  bilaterally, normal work of breathing Heart- Regular rate and rhythm, no murmurs, rubs or gallops, PMI not laterally displaced GI- soft, NT, ND, + BS Extremities- no clubbing, cyanosis, or edema  Pacemaker interrogation- reviewed in detail today,  See PACEART report  Assessment and Plan:  1. Tachycardia/ Bradycardia Normal pacemaker function See Pace Art report No changes today  2. afib continue xarelto 20mg  daily Decrease amiodarone to 100mg  daily Check LFTs/TFTs today  Return in 6 months

## 2013-03-31 LAB — PACEMAKER DEVICE OBSERVATION
BRDY-0002RV: 40 {beats}/min
RV LEAD AMPLITUDE: 12 mv
RV LEAD IMPEDENCE PM: 570 Ohm
RV LEAD THRESHOLD: 0.5 V

## 2013-03-31 LAB — HEPATIC FUNCTION PANEL
ALT: 65 U/L — ABNORMAL HIGH (ref 0–35)
AST: 65 U/L — ABNORMAL HIGH (ref 0–37)
Bilirubin, Direct: 0.1 mg/dL (ref 0.0–0.3)
Total Bilirubin: 0.5 mg/dL (ref 0.3–1.2)
Total Protein: 7.7 g/dL (ref 6.0–8.3)

## 2013-04-07 ENCOUNTER — Telehealth: Payer: Self-pay | Admitting: Internal Medicine

## 2013-04-07 NOTE — Telephone Encounter (Signed)
Will fax her labs to Dr Tawana Scale office  Patient aware

## 2013-04-07 NOTE — Telephone Encounter (Signed)
New problem      Discuss blood work .  seen today by Dr. Andrey Campanile office  # [phone]  / Fax # [phone] office has question regarding her liver enzymes.

## 2013-04-18 ENCOUNTER — Telehealth: Payer: Self-pay | Admitting: Internal Medicine

## 2013-04-18 NOTE — Telephone Encounter (Signed)
New Problem  pt called and spoke w/ kelly on Sept 4th/ states she was supposed to fax information back to Dr. Mickle Mallory for liver enzymes// Dr. Andrey Campanile states she has not received them.   Fax # [phone]

## 2013-04-18 NOTE — Telephone Encounter (Signed)
Faxed again today.

## 2013-06-03 ENCOUNTER — Other Ambulatory Visit: Payer: Self-pay | Admitting: *Deleted

## 2013-06-03 DIAGNOSIS — C3490 Malignant neoplasm of unspecified part of unspecified bronchus or lung: Secondary | ICD-10-CM

## 2013-06-10 IMAGING — CR DG CHEST 2V
2 series · 2 of 2 positions shown · non-contrast
Comparison: PET 10/12/2012 and chest radiograph 09/15/2012.

CLINICAL DATA: Preop lung mass.

CHEST - 2 VIEW

[view not recorded (1 of 2)]
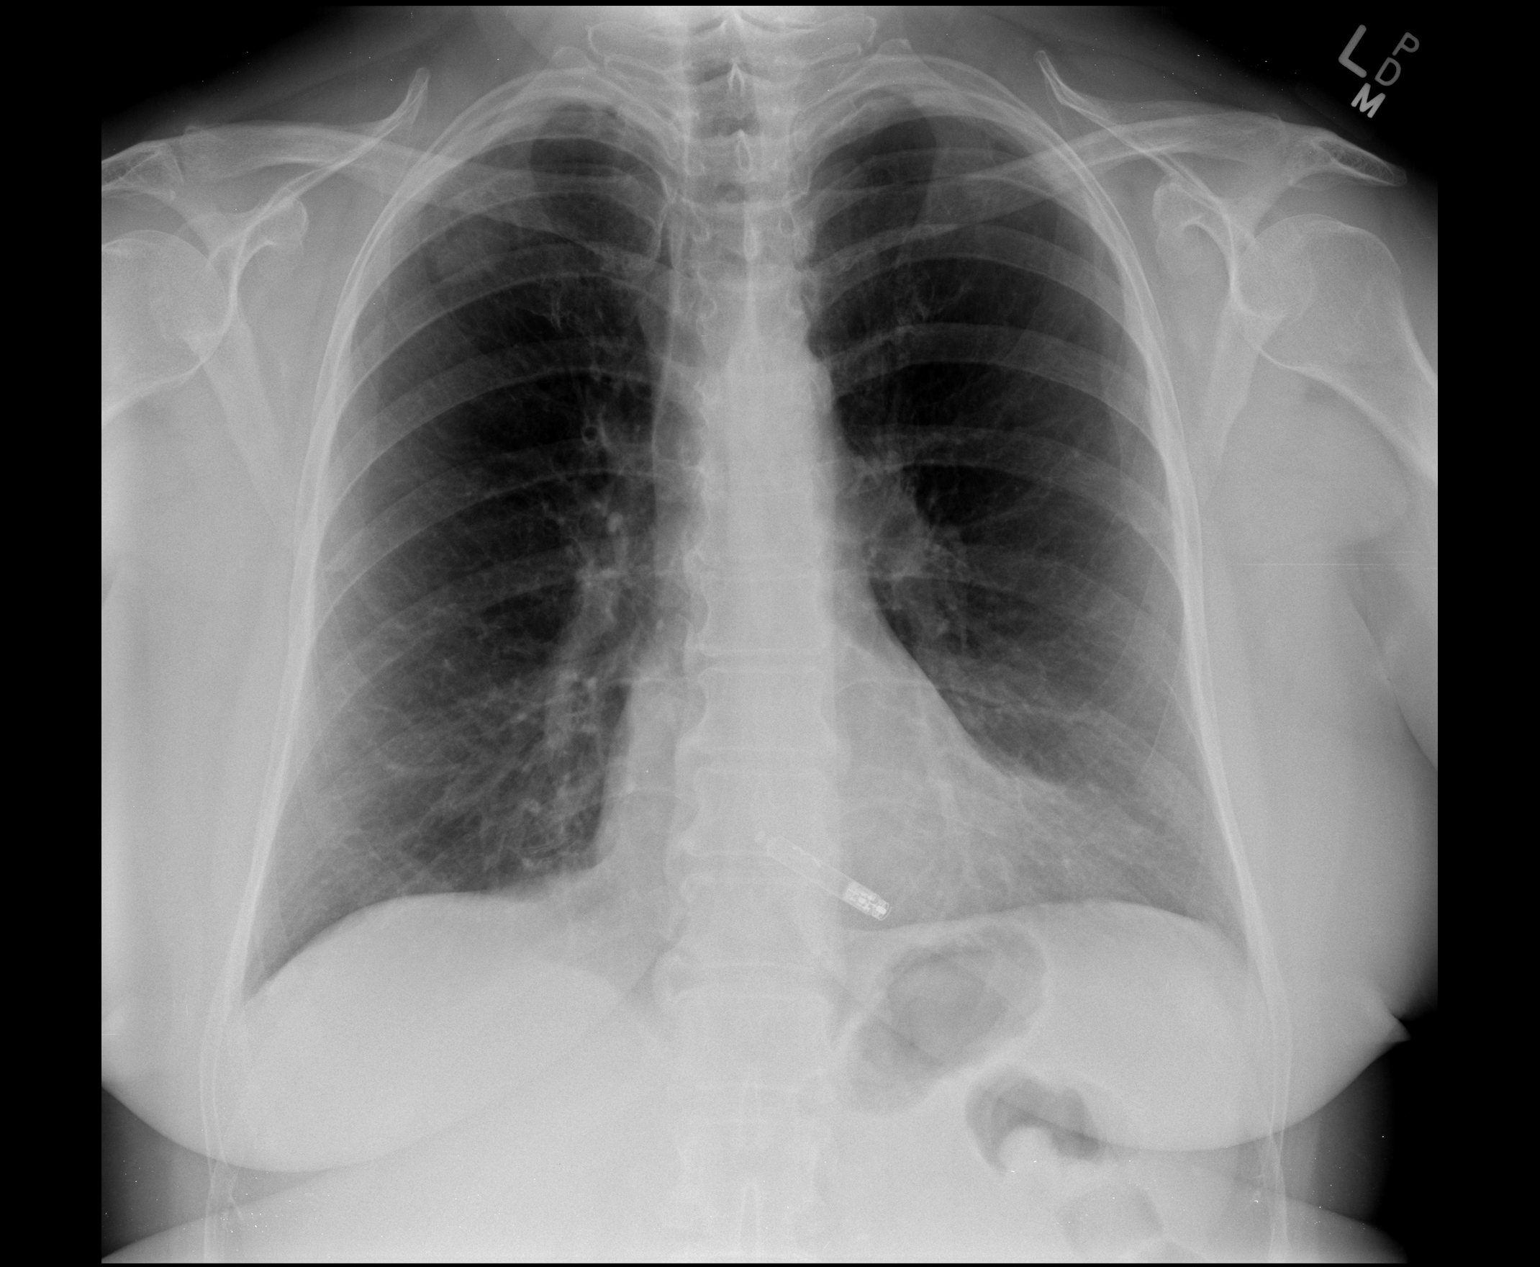

[view not recorded (2 of 2)]
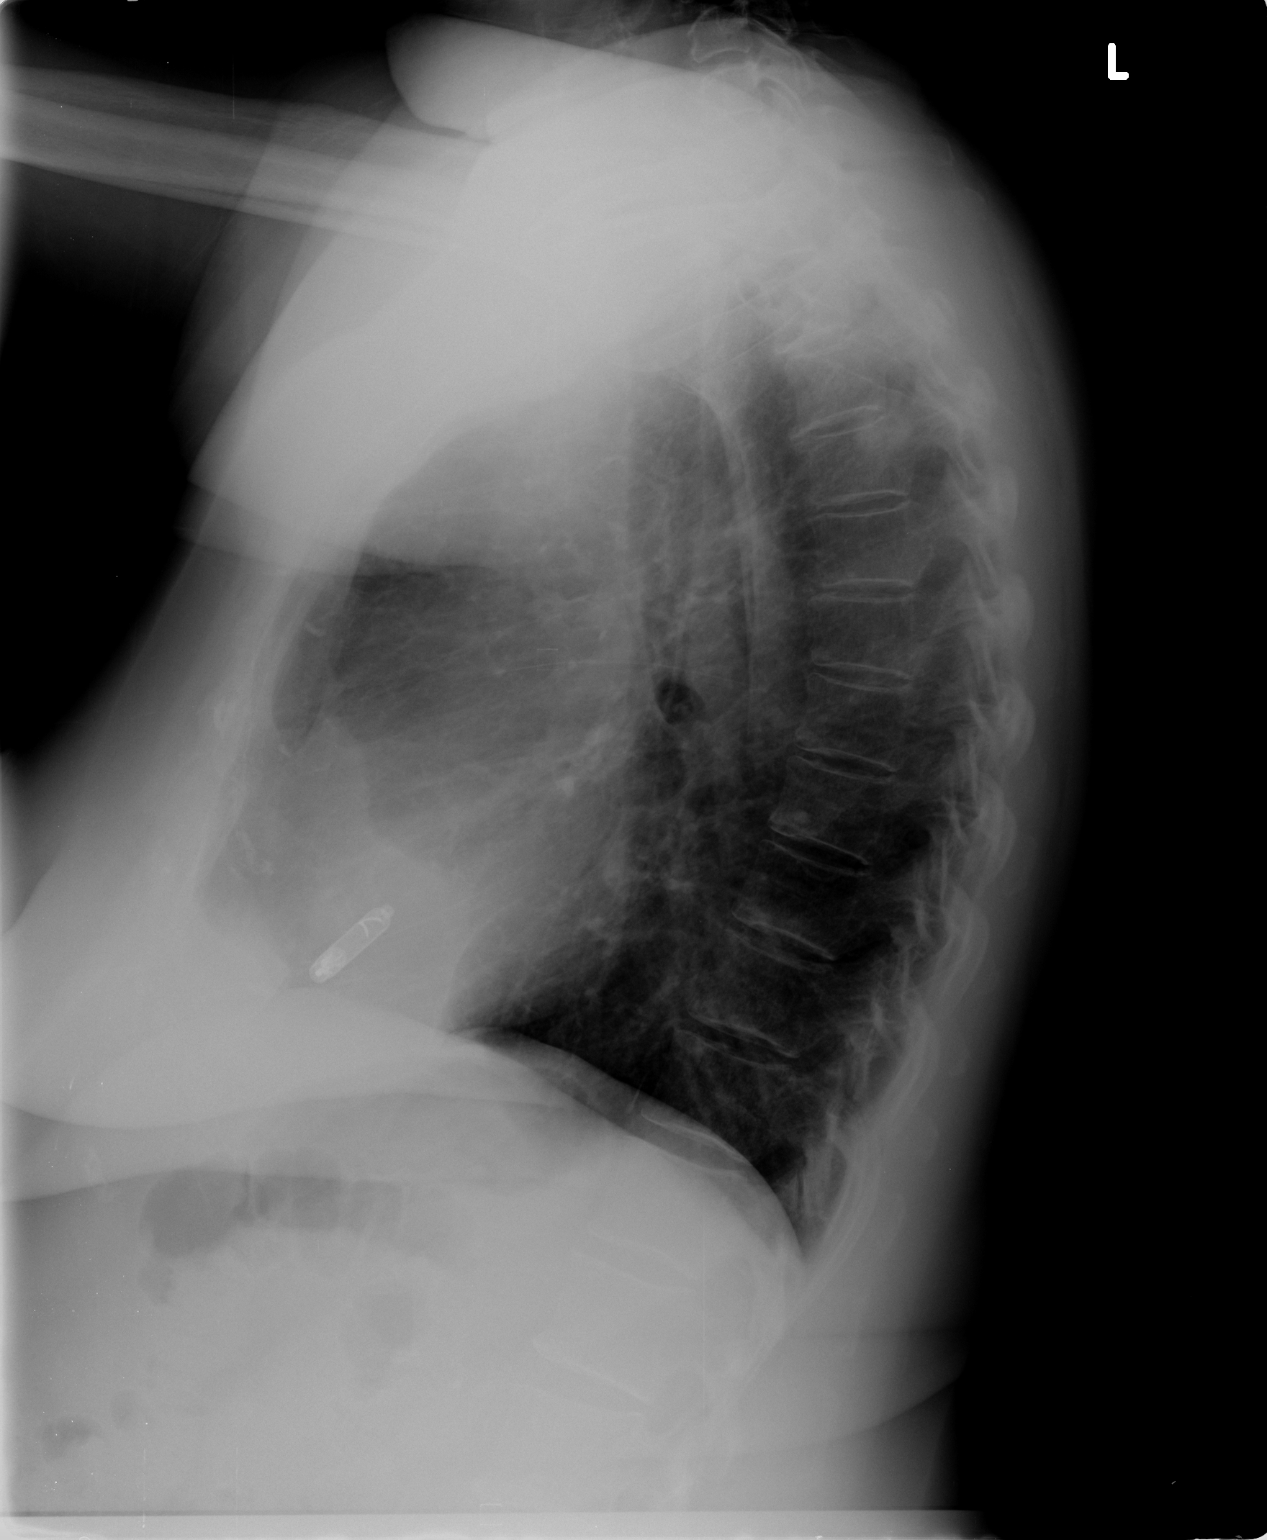

[2 of 2 positions shown; findings below may reference images not displayed]

FINDINGS: Trachea is midline.  Heart size normal.  An implantable
device projects over the right ventricle.  Right upper lobe nodule
is again noted.  Lungs are otherwise clear.  No pleural fluid.
IMPRESSION: Right upper lobe nodule, most consistent with primary bronchogenic
carcinoma.

## 2013-06-28 ENCOUNTER — Encounter: Payer: Self-pay | Admitting: Thoracic Surgery (Cardiothoracic Vascular Surgery)

## 2013-06-28 ENCOUNTER — Ambulatory Visit (INDEPENDENT_AMBULATORY_CARE_PROVIDER_SITE_OTHER): Payer: Medicare Other | Admitting: Critical Care Medicine

## 2013-06-28 ENCOUNTER — Ambulatory Visit (INDEPENDENT_AMBULATORY_CARE_PROVIDER_SITE_OTHER): Payer: Medicare Other | Admitting: Thoracic Surgery (Cardiothoracic Vascular Surgery)

## 2013-06-28 ENCOUNTER — Ambulatory Visit: Payer: Medicare Other | Admitting: Thoracic Surgery (Cardiothoracic Vascular Surgery)

## 2013-06-28 ENCOUNTER — Other Ambulatory Visit: Payer: Self-pay | Admitting: *Deleted

## 2013-06-28 ENCOUNTER — Ambulatory Visit: Payer: Medicaid Other | Admitting: Thoracic Surgery (Cardiothoracic Vascular Surgery)

## 2013-06-28 ENCOUNTER — Encounter: Payer: Self-pay | Admitting: Critical Care Medicine

## 2013-06-28 ENCOUNTER — Other Ambulatory Visit: Payer: Self-pay | Admitting: Thoracic Surgery (Cardiothoracic Vascular Surgery)

## 2013-06-28 ENCOUNTER — Ambulatory Visit
Admission: RE | Admit: 2013-06-28 | Discharge: 2013-06-28 | Disposition: A | Discharge status: Home / Self Care | Payer: Medicaid Other | Source: Ambulatory Visit | Attending: Thoracic Surgery (Cardiothoracic Vascular Surgery) | Admitting: Thoracic Surgery (Cardiothoracic Vascular Surgery)

## 2013-06-28 VITALS — BP 125/60 | HR 70 | Resp 20 | Ht 62.0 in | Wt 155.0 lb

## 2013-06-28 VITALS — BP 124/60 | HR 69 | Temp 98.4°F | Ht 62.0 in | Wt 152.2 lb

## 2013-06-28 DIAGNOSIS — C3490 Malignant neoplasm of unspecified part of unspecified bronchus or lung: Secondary | ICD-10-CM

## 2013-06-28 DIAGNOSIS — Z85118 Personal history of other malignant neoplasm of bronchus and lung: Secondary | ICD-10-CM

## 2013-06-28 DIAGNOSIS — Z09 Encounter for follow-up examination after completed treatment for conditions other than malignant neoplasm: Secondary | ICD-10-CM

## 2013-06-28 DIAGNOSIS — Z9889 Other specified postprocedural states: Secondary | ICD-10-CM

## 2013-06-28 DIAGNOSIS — Z902 Acquired absence of lung [part of]: Secondary | ICD-10-CM

## 2013-06-28 DIAGNOSIS — J449 Chronic obstructive pulmonary disease, unspecified: Secondary | ICD-10-CM

## 2013-06-28 NOTE — Progress Notes (Signed)
Subjective:    Patient ID: Nancy Hodges, female    DOB: 1945/08/07, 67 y.o.   MRN: [id number]  HPI  67 y.o. F     06/28/2013 Chief Complaint  Patient presents with  . 4 month follow up    Reports PND and sinus pressure at times.  Cough with very little mucus.  No SOB, wheezing, or chest tightness/pain.  No further bleeding.  Only using symbicort as needed Only uses Saba 1-2 times weekly.  Notes mostly pndrip. Mucus is clear.  Notes some pndrip.  Notes some sinus pressure. ABX for sinus infections this fall.    Review of Systems  Constitutional:   No  weight loss, night sweats,  Fevers, chills, fatigue, lassitude. HEENT:   No headaches,  Difficulty swallowing,  Tooth/dental problems,  Sore throat,                No sneezing, itching, ear ache, +++nasal congestion, +++post nasal drip,   CV:  No chest pain,  Orthopnea, PND, swelling in lower extremities, anasarca, dizziness, palpitations  GI  No heartburn, indigestion, abdominal pain, nausea, vomiting, diarrhea, change in bowel habits, loss of appetite  Resp: No shortness of breath with exertion or at rest.  No excess mucus, no productive cough,  No non-productive cough,  No coughing up of blood.  No change in color of mucus.  No wheezing.  No chest wall deformity  Skin: no rash or lesions.  GU: no dysuria, change in color of urine, no urgency or frequency.  No flank pain.  MS:  No joint pain or swelling.  No decreased range of motion.  No back pain.  Psych:  No change in mood or affect. No depression or anxiety.  No memory loss.     Objective:   Physical Exam  Filed Vitals:   06/28/13 1343  BP: 124/60  Pulse: 69  Temp: 98.4 F (36.9 C)  TempSrc: Oral  Height: 5\' 2"  (1.575 m)  Weight: 152 lb 3.2 oz (69.037 kg)  SpO2: 96%    Gen: Pleasant, well-nourished, in no distress,  normal affect  ENT: No lesions,  mouth clear,  oropharynx clear, no postnasal drip  Neck: No JVD, no TMG, no carotid bruits  Lungs: No  use of accessory muscles, no dullness to percussion, exp wheeze, improved airflow  Cardiovascular: RRR, heart sounds normal, no murmur or gallops, no peripheral edema  Abdomen: soft and NT, no HSM,  BS normal  Musculoskeletal: No deformities, no cyanosis or clubbing  Neuro: alert, non focal  Skin: Warm, no lesions or rashes  Ct Chest Wo Contrast  06/28/2013   CLINICAL DATA:  History right lung cancer.  Follow-up.  Cough.  EXAM: CT CHEST WITHOUT CONTRAST  TECHNIQUE: Multidetector CT imaging of the chest was performed following the standard protocol without IV contrast.  COMPARISON:  02/08/2013  FINDINGS: Postsurgical changes in the right upper lobe. Linear scarring is stable. No recurrent or new mass. No visible pulmonary nodules. Linear scarring in the lung bases bilaterally.  Lead left pacer is again noted in the right ventricle, unchanged. Heart is normal size. Coronary artery calcifications are noted. No mediastinal, hilar, or axillary adenopathy. Chest wall soft tissues are unremarkable.  No pleural or pericardial effusion. Imaging into the upper abdomen shows no acute findings. Layering gallstones again noted within the gallbladder.  No acute bony abnormality or focal lesion.  IMPRESSION: Postsurgical changes in the right upper lobe from right upper lobectomy. No evidence of recurrent or metastatic  disease.  Cholelithiasis.   Electronically Signed   By: Charlett Nose M.D.   On: 06/28/2013 12:42          Assessment & Plan:   Obstructive chronic bronchitis without exacerbation Gold A  Chronic obstructive lung disease gold stage A now stable at this time This patient is doing well off Symbicort and can be maintained only on an as needed short acting bronchodilator Plan Discontinue Symbicort Maintain Combivent as needed    Updated Medication List Outpatient Encounter Prescriptions as of 06/28/2013  Medication Sig  . acetaminophen (TYLENOL) 500 MG tablet Take 500 mg by mouth every 6  (six) hours as needed for pain.  [name] albuterol (PROVENTIL) (2.5 MG/3ML) 0.083% nebulizer solution Take 3 mLs (2.5 mg total) by nebulization every 2 (two) hours as needed for shortness of breath.  [name] albuterol-ipratropium (COMBIVENT) 18-103 MCG/ACT inhaler Inhale 2 puffs into the lungs every 6 (six) hours as needed for wheezing.  [name] amiodarone (PACERONE) 100 MG tablet Take 1 tablet (100 mg total) by mouth daily.  [name] diltiazem (CARDIZEM CD) 240 MG 24 hr capsule Take 1 capsule (240 mg total) by mouth daily.  . fexofenadine (ALLEGRA) 180 MG tablet Take 180 mg by mouth daily.  . fluticasone (FLONASE) 50 MCG/ACT nasal spray Place 2 sprays into the nose daily as needed (for congestion).  [name] guaiFENesin-dextromethorphan (ROBITUSSIN DM) 100-10 MG/5ML syrup Take 5 mLs by mouth 3 (three) times daily as needed for cough.  . Rivaroxaban (XARELTO) 20 MG TABS Take 1 tablet (20 mg total) by mouth daily.  . [DISCONTINUED] budesonide-formoterol (SYMBICORT) 160-4.5 MCG/ACT inhaler Inhale 2 puffs into the lungs 2 (two) times daily.  . [DISCONTINUED] budesonide-formoterol (SYMBICORT) 160-4.5 MCG/ACT inhaler Inhale 2 puffs into the lungs 2 (two) times daily as needed.

## 2013-06-28 NOTE — Patient Instructions (Signed)
Stop symbicort If you use rescue inhaler consistently more than twice a week then resume symbicort two puff twice daily and call us to tell us Use flonase daily two puff each nostril Return 6 months

## 2013-06-28 NOTE — Progress Notes (Signed)
HPI:  Nancy Hodges returns today for a scheduled for month followup visit.  She is a 67 year old woman who was having a pacemaker placed earlier this year. A chest x-ray showed a right upper lobe nodule. We did a right upper lobectomy thoracoscopically on her in March. It turned out to be a stage IB non-small cell carcinoma. She was readmitted in July with hemoptysis. She was on Xarelto at the time. We did a bronchoscopy and it did not show any significant abnormalities. The Xarelto was discontinued. We last saw her in the office in July. That time she was doing well with no evidence of recurrent disease.  Since her last visit she has been started on Symbicort by Dr. Delford Field for COPD. She says she does not like this medication and is not using it every day. She says her weight has been stable. She's not had any unusual headaches or visual changes. She denied any new bone or joint pain. She has had occasional cough associated with sinus drainage. She's not had any additional hemoptysis since the episode in July. She has been started back on Xarelto for her atrial fibrillation.  Past Medical History  Diagnosis Date  . Asthma   . History of sinusitis   . Allergic rhinitis   . History of kidney stones   . 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 H/O hiatal hernia   . GERD (gastroesophageal reflux disease)   . Pulmonary nodule   . Paroxysmal atrial fibrillation     CHADS score: 0  . Atrial flutter   . Tachycardia-bradycardia syndrome     a. 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  [name] Anxiety   . Headache(784.0)     sinus  . Lung cancer     a. Stage IB non-small cell carcinoma, s/p R VATS, wedge resection, RU lobectomy 10/2012.      Current Outpatient Prescriptions  Medication Sig Dispense Refill  . acetaminophen (TYLENOL) 500 MG tablet Take 500 mg by mouth every 6 (six) hours as needed for pain.      [name] albuterol (PROVENTIL) (2.5 MG/3ML) 0.083% nebulizer solution Take 3 mLs  (2.5 mg total) by nebulization every 2 (two) hours as needed for shortness of breath.  120 mL  0  . albuterol-ipratropium (COMBIVENT) 18-103 MCG/ACT inhaler Inhale 2 puffs into the lungs every 6 (six) hours as needed for wheezing.      [name] amiodarone (PACERONE) 100 MG tablet Take 1 tablet (100 mg total) by mouth daily.  30 tablet  6  . budesonide-formoterol (SYMBICORT) 160-4.5 MCG/ACT inhaler Inhale 2 puffs into the lungs 2 (two) times daily.  1 Inhaler  12  . diltiazem (CARDIZEM CD) 240 MG 24 hr capsule Take 1 capsule (240 mg total) by mouth daily.  30 capsule  6  . fexofenadine (ALLEGRA) 180 MG tablet Take 180 mg by mouth daily.      . fluticasone (FLONASE) 50 MCG/ACT nasal spray Place 2 sprays into the nose daily as needed (for congestion).      [name] guaiFENesin-dextromethorphan (ROBITUSSIN DM) 100-10 MG/5ML syrup Take 5 mLs by mouth 3 (three) times daily as needed for cough.  118 mL  0  . Rivaroxaban (XARELTO) 20 MG TABS Take 1 tablet (20 mg total) by mouth daily.  30 tablet  11   No current facility-administered medications for this visit.    Physical Exam BP 125/60  Pulse 70  Resp 20  Ht 5\' 2"  (1.575 m)  Wt 155 lb (70.308  kg)  BMI 28.34 kg/m2  SpO2 96% 67 yo woman in no acute distress Neuro alert and oriented x3 no focal deficits No cervical or suprapubic or adenopathy Lungs clear with equal breath sounds bilaterally Cardiac regular rate and rhythm  Diagnostic Tests: CT chest 06/28/2013 CT CHEST WITHOUT CONTRAST  TECHNIQUE:  Multidetector CT imaging of the chest was performed following the  standard protocol without IV contrast.  COMPARISON: 02/08/2013  FINDINGS:  Postsurgical changes in the right upper lobe. Linear scarring is  stable. No recurrent or new mass. No visible pulmonary nodules.  Linear scarring in the lung bases bilaterally.  Lead left pacer is again noted in the right ventricle, unchanged.  Heart is normal size. Coronary artery calcifications are noted. No   mediastinal, hilar, or axillary adenopathy. Chest wall soft tissues  are unremarkable.  No pleural or pericardial effusion. Imaging into the upper abdomen  shows no acute findings. Layering gallstones again noted within the  gallbladder.  No acute bony abnormality or focal lesion.  IMPRESSION:  Postsurgical changes in the right upper lobe from right upper  lobectomy. No evidence of recurrent or metastatic disease.  Cholelithiasis.  Electronically Signed  By: Charlett Nose M.D.  On: 06/28/2013 12:42  Impression: 67 year old woman who is now 8 months post thoracoscopic right upper lobectomy for a stage IB non-small cell carcinoma. She's doing well clinically. Her CT shows no evidence recurrent disease. There is an area of scarring at the right base but appears slightly more nodular to me that had on her PET/CT. I suspect this may be round atelectasis. But we will need to pay attention to this on followup.  Plan: Return in 4 months with CT of chest for one year followup visit.

## 2013-06-29 NOTE — Assessment & Plan Note (Signed)
Chronic obstructive lung disease gold stage A now stable at this time This patient is doing well off Symbicort and can be maintained only on an as needed short acting bronchodilator Plan Discontinue Symbicort Maintain Combivent as needed

## 2013-07-05 ENCOUNTER — Other Ambulatory Visit: Payer: Medicare Other

## 2013-07-05 ENCOUNTER — Ambulatory Visit: Payer: Medicare Other | Admitting: Thoracic Surgery (Cardiothoracic Vascular Surgery)

## 2013-08-04 HISTORY — PX: OTHER SURGICAL HISTORY: SHX169

## 2013-09-28 ENCOUNTER — Encounter: Payer: Medicare Other | Admitting: Internal Medicine

## 2013-10-05 ENCOUNTER — Ambulatory Visit (INDEPENDENT_AMBULATORY_CARE_PROVIDER_SITE_OTHER): Payer: Medicare Other | Admitting: Internal Medicine

## 2013-10-05 ENCOUNTER — Encounter: Payer: Self-pay | Admitting: Internal Medicine

## 2013-10-05 VITALS — BP 130/60 | HR 67 | Ht 62.0 in | Wt 140.4 lb

## 2013-10-05 DIAGNOSIS — I480 Paroxysmal atrial fibrillation: Secondary | ICD-10-CM

## 2013-10-05 DIAGNOSIS — M2550 Pain in unspecified joint: Secondary | ICD-10-CM

## 2013-10-05 DIAGNOSIS — I4891 Unspecified atrial fibrillation: Secondary | ICD-10-CM

## 2013-10-05 DIAGNOSIS — IMO0001 Reserved for inherently not codable concepts without codable children: Secondary | ICD-10-CM

## 2013-10-05 DIAGNOSIS — I495 Sick sinus syndrome: Secondary | ICD-10-CM

## 2013-10-05 DIAGNOSIS — M791 Myalgia, unspecified site: Secondary | ICD-10-CM

## 2013-10-05 LAB — CK: Total CK: 23 U/L (ref 7–177)

## 2013-10-05 LAB — HEPATIC FUNCTION PANEL
ALK PHOS: 183 U/L — AB (ref 39–117)
ALT: 27 U/L (ref 0–35)
AST: 47 U/L — AB (ref 0–37)
Albumin: 3.4 g/dL — ABNORMAL LOW (ref 3.5–5.2)
BILIRUBIN DIRECT: 0.2 mg/dL (ref 0.0–0.3)
BILIRUBIN TOTAL: 1.3 mg/dL — AB (ref 0.3–1.2)
Total Protein: 7.7 g/dL (ref 6.0–8.3)

## 2013-10-05 LAB — TSH: TSH: 1.72 u[IU]/mL (ref 0.35–5.50)

## 2013-10-05 NOTE — Patient Instructions (Addendum)
Your physician wants you to follow-up in:6 months with Andi Hence and 12 months with Dr [name] and  You will receive a reminder letter in the mail two months in advance. If you don't receive a letter, please call our office to schedule the follow-up appointment.  Your physician recommends that you return for lab work today

## 2013-10-05 NOTE — Addendum Note (Signed)
Addended by: Janan Halter F on: 10/05/2013 03:06 PM   Modules accepted: Orders

## 2013-10-05 NOTE — Progress Notes (Signed)
Nancy Hodges is a 68 y.o. female who presents today for routine electrophysiology followup.  She is doing very well.  Her primary concern is with difficuse myalgias/ arthralgias.  Today, she denies symptoms of chest pain, shortness of breath,  lower extremity edema, dizziness, presyncope, or syncope. She has only very rare palpitations. The patient is otherwise without complaint today.   Past Medical History  Diagnosis Date  . Asthma   . History of sinusitis   . Allergic rhinitis   . History of kidney stones   . 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 H/O hiatal hernia   . GERD (gastroesophageal reflux disease)   . Pulmonary nodule   . Paroxysmal atrial fibrillation     CHADS score: 0  . Atrial flutter   . Tachycardia-bradycardia syndrome     a. 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  [name] Anxiety   . Headache(784.0)     sinus  . Lung cancer     a. Stage IB non-small cell carcinoma, s/p R VATS, wedge resection, RU lobectomy 10/2012.   Past Surgical History  Procedure Laterality Date  . Partial hysterectomy    . Pacemaker insertion  09/14/2012    Nanostim (SJM) leadless pacemaker (LEADLESS II STUDY PATIENT) implanted by Dr [name]  . Video assisted thoracoscopy (vats)/wedge resection Right 10/27/2012    Procedure: VIDEO ASSISTED THORACOSCOPY (VATS),RGHT UPPER LOBE LUNG WEDGE RESECTION;  Surgeon: [name], MD;  Location: [facility];  Service: Thoracic;  Laterality: Right;  . Lobectomy Right 10/27/2012    Procedure: LOBECTOMY;  Surgeon: [name], MD;  Location: [facility];  Service: Thoracic;  Laterality: Right;   RIGHT UPPER LOBECTOMY & Node Dissection    Current Outpatient Prescriptions  Medication Sig Dispense Refill  . acetaminophen (TYLENOL) 500 MG tablet Take 500 mg by mouth every 6 (six) hours as needed for pain.      [name] albuterol (PROVENTIL) (2.5 MG/3ML) 0.083% nebulizer solution Take 3 mLs (2.5 mg total) by nebulization every 2 (two) hours as  needed for shortness of breath.  120 mL  0  . albuterol-ipratropium (COMBIVENT) 18-103 MCG/ACT inhaler Inhale 2 puffs into the lungs every 6 (six) hours as needed for wheezing.      [name] amiodarone (PACERONE) 100 MG tablet Take 1 tablet (100 mg total) by mouth daily.  30 tablet  6  . diltiazem (CARDIZEM CD) 240 MG 24 hr capsule Take 1 capsule (240 mg total) by mouth daily.  30 capsule  6  . fexofenadine (ALLEGRA) 180 MG tablet Take 180 mg by mouth daily.      . fluticasone (FLONASE) 50 MCG/ACT nasal spray Place 2 sprays into the nose daily as needed (for congestion).      [name] guaiFENesin-dextromethorphan (ROBITUSSIN DM) 100-10 MG/5ML syrup Take 5 mLs by mouth 3 (three) times daily as needed for cough.  118 mL  0  . Rivaroxaban (XARELTO) 20 MG TABS Take 1 tablet (20 mg total) by mouth daily.  30 tablet  11  . traMADol (ULTRAM) 50 MG tablet Take 50 mg by mouth every 6 (six) hours as needed (one every 4-6 hours prn for pain).       No current facility-administered medications for this visit.    Physical Exam: Filed Vitals:   10/05/13 1422  BP: 130/60  Pulse: 67  Height: 5' 2" (1.575 m)  Weight: 140 lb 6 oz (63.674 kg)    GEN- The patient is well appearing, alert and oriented x  3 today.   Head- normocephalic, atraumatic Eyes-  Sclera clear, conjunctiva pink Ears- hearing intact Oropharynx- clear Lungs- Clear to ausculation bilaterally, normal work of breathing Heart- Regular rate and rhythm, no murmurs, rubs or gallops, PMI not laterally displaced GI- soft, NT, ND, + BS Extremities- no clubbing, cyanosis, or edema  Pacemaker interrogation- reviewed in detail today,  See PACEART report  Assessment and Plan:  1. Tachycardia/ Bradycardia Normal pacemaker function See Pace Art report No changes today  2. afib continue xarelto 63m daily amiodarone to 1065mdaily Check LFTs/TFTs today  3. Arthralgias Check esr and ck today Follow-up with primary care She may benefit from referral  to rheumatology if symptoms persist  Return in 6 months to see BrJerene Pitch will see in a year

## 2013-10-06 LAB — SEDIMENTATION RATE: Sed Rate: 80 mm/hr — ABNORMAL HIGH (ref 0–22)

## 2013-10-10 LAB — MDC_IDC_ENUM_SESS_TYPE_INCLINIC
Implantable Pulse Generator Serial Number: 1095
Lead Channel Impedance Value: 550 Ohm
Lead Channel Pacing Threshold Amplitude: 0.75 V
Lead Channel Pacing Threshold Pulse Width: 0.4 ms
Lead Channel Sensing Intrinsic Amplitude: 12 mV
Lead Channel Setting Pacing Amplitude: 2.5 V
MDC IDC MSMT BATTERY VOLTAGE: 3.3 V
MDC IDC SET LEADCHNL RV PACING PULSEWIDTH: 0.4 ms
MDC IDC SET LEADCHNL RV SENSING SENSITIVITY: 2 mV
MDC IDC STAT BRADY RV PERCENT PACED: 0 %

## 2013-10-25 ENCOUNTER — Ambulatory Visit
Admission: RE | Admit: 2013-10-25 | Discharge: 2013-10-25 | Disposition: A | Discharge status: Home / Self Care | Payer: Medicare Other | Source: Ambulatory Visit | Attending: Thoracic Surgery (Cardiothoracic Vascular Surgery) | Admitting: Thoracic Surgery (Cardiothoracic Vascular Surgery)

## 2013-10-25 ENCOUNTER — Ambulatory Visit (INDEPENDENT_AMBULATORY_CARE_PROVIDER_SITE_OTHER): Payer: Medicare Other | Admitting: Thoracic Surgery (Cardiothoracic Vascular Surgery)

## 2013-10-25 ENCOUNTER — Encounter: Payer: Self-pay | Admitting: Thoracic Surgery (Cardiothoracic Vascular Surgery)

## 2013-10-25 ENCOUNTER — Encounter: Payer: Medicaid Other | Admitting: Cardiology

## 2013-10-25 VITALS — BP 125/58 | HR 74 | Resp 16 | Ht 62.0 in | Wt 140.0 lb

## 2013-10-25 DIAGNOSIS — Z9889 Other specified postprocedural states: Secondary | ICD-10-CM

## 2013-10-25 DIAGNOSIS — C3410 Malignant neoplasm of upper lobe, unspecified bronchus or lung: Secondary | ICD-10-CM

## 2013-10-25 DIAGNOSIS — C3490 Malignant neoplasm of unspecified part of unspecified bronchus or lung: Secondary | ICD-10-CM

## 2013-10-25 DIAGNOSIS — Z902 Acquired absence of lung [part of]: Secondary | ICD-10-CM

## 2013-10-25 NOTE — Progress Notes (Signed)
HPI:  Nancy Hodges returns today for her one-year followup.  She is a 68 year old woman who had a thoracoscopic right upper lobectomy for a stage IB non-small cell carcinoma in March 2014. Her cancer was discovered on a chest x-ray prior to a pacemaker implant. She recently saw Dr. [name] and her pacemaker is functioning well. She is on amiodarone and Xarelto for her paroxysmal atrial fibrillation. She does have COPD which is followed by Dr. Saralyn Pilar right. She has been off of Symbicort and only has to use her Combivent occasionally. She did not have any COPD exacerbations over the winter.  She does still have some pain referable to her incision, but is not severe and she does not take anything for that. She has been having bilateral knee pain and says her appetite is been poor. She thinks she had a virus. She says that cortisone injections been recommended for knees but she does not want to do that present time.  She has not had any hemoptysis.  Past Medical History  Diagnosis Date  . Asthma   . History of sinusitis   . Allergic rhinitis   . History of kidney stones   . 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 H/O hiatal hernia   . GERD (gastroesophageal reflux disease)   . Pulmonary nodule   . Paroxysmal atrial fibrillation     CHADS score: 0  . Atrial flutter   . Tachycardia-bradycardia syndrome     a. 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  [name] Anxiety   . Headache(784.0)     sinus  . Lung cancer     a. Stage IB non-small cell carcinoma, s/p R VATS, wedge resection, RU lobectomy 10/2012.      Current Outpatient Prescriptions  Medication Sig Dispense Refill  . acetaminophen (TYLENOL) 500 MG tablet Take 500 mg by mouth every 6 (six) hours as needed for pain.      [name] albuterol (PROVENTIL) (2.5 MG/3ML) 0.083% nebulizer solution Take 3 mLs (2.5 mg total) by nebulization every 2 (two) hours as needed for shortness of breath.  120 mL  0  . albuterol-ipratropium  (COMBIVENT) 18-103 MCG/ACT inhaler Inhale 2 puffs into the lungs every 6 (six) hours as needed for wheezing.      [name] amiodarone (PACERONE) 100 MG tablet Take 1 tablet (100 mg total) by mouth daily.  30 tablet  6  . diltiazem (CARDIZEM CD) 240 MG 24 hr capsule Take 1 capsule (240 mg total) by mouth daily.  30 capsule  6  . fexofenadine (ALLEGRA) 180 MG tablet Take 180 mg by mouth daily.      . fluticasone (FLONASE) 50 MCG/ACT nasal spray Place 2 sprays into the nose daily as needed (for congestion).      [name] guaiFENesin-dextromethorphan (ROBITUSSIN DM) 100-10 MG/5ML syrup Take 5 mLs by mouth 3 (three) times daily as needed for cough.  118 mL  0  . Rivaroxaban (XARELTO) 20 MG TABS Take 1 tablet (20 mg total) by mouth daily.  30 tablet  11  . traMADol (ULTRAM) 50 MG tablet Take 50 mg by mouth every 6 (six) hours as needed (one every 4-6 hours prn for pain).       No current facility-administered medications for this visit.    Physical Exam BP 125/58  Pulse 74  Resp 16  Ht 5\' 2"  (1.575 m)  Wt 140 lb (63.504 kg)  BMI 25.60 kg/m2  SpO42 22% 68 year old woman in no acute distress Alert and oriented  x3 with no focal neurologic deficits No cervical or subclavicular adenopathy Lungs clear, breath sounds equal, no wheezes Cardiac regular rate and rhythm Right chest incisions well healed  Diagnostic Tests: CT CHEST WITHOUT CONTRAST  TECHNIQUE:  Multidetector CT imaging of the chest was performed following the  standard protocol without IV contrast.  COMPARISON: 06/28/2013  FINDINGS:  Postsurgical changes from prior right upper lobectomy remains  stable. Mild pleural-parenchymal scarring in the right mid and lower  lung remains stable. No suspicious pulmonary nodules or masses are  visualized in either lung. No evidence of pulmonary infiltrate or  central endobronchial lesion.  No evidence of pleural or pericardial effusion. No evidence of hilar  or mediastinal lymphadenopathy. No adenopathy  seen elsewhere within  the thorax.  No evidence of chest wall mass or suspicious bone lesions.  Visualized portions of the adrenal glands are unremarkable.  IMPRESSION:  Stable postop changes in right hemi thorax. No evidence of recurrent  or metastatic carcinoma or other active disease.  Electronically Signed  By: Earle Gell M.D.  On: 10/25/2013 10:04    Impression: 68 year old woman who is now one year removed from a thoracoscopic right upper lobectomy for stage Ib non-small cell carcinoma. She did not require adjuvant therapy. She is doing well from a surgical standpoint. She has no evidence recurrent disease.   Plan: Return in 6 months with CT of chest

## 2013-10-26 ENCOUNTER — Other Ambulatory Visit: Payer: Self-pay | Admitting: Internal Medicine

## 2013-11-21 ENCOUNTER — Other Ambulatory Visit: Payer: Self-pay | Admitting: *Deleted

## 2013-11-21 MED ORDER — DILTIAZEM HCL ER COATED BEADS 240 MG PO CP24: 240.0000 mg | ORAL_CAPSULE | Freq: Every day | ORAL | Status: DC

## 2014-01-03 ENCOUNTER — Telehealth: Payer: Self-pay | Admitting: Internal Medicine

## 2014-01-03 NOTE — Telephone Encounter (Signed)
New message     Office calling regarding patient pacemaker. Seen in ed couple of days ago neck / shoulder pain .   hemoccult positive stools.      colonoscopy & EGD surgery on 6/15.

## 2014-01-03 NOTE — Telephone Encounter (Signed)
Called and spoke with Nancy Hodges and said ok to proceed with colonoscopy and EGD

## 2014-01-06 ENCOUNTER — Telehealth: Payer: Self-pay | Admitting: Internal Medicine

## 2014-01-06 NOTE — Telephone Encounter (Signed)
Nancy Hodges is going to call her and set up both research for the same day

## 2014-01-06 NOTE — Telephone Encounter (Signed)
New Prob    Pt is requesting to speak to a nurse regarding a pacer check. Please call.

## 2014-01-18 ENCOUNTER — Encounter: Payer: Self-pay | Admitting: Cardiology

## 2014-02-28 ENCOUNTER — Other Ambulatory Visit: Payer: Self-pay | Admitting: *Deleted

## 2014-02-28 ENCOUNTER — Other Ambulatory Visit: Payer: Self-pay | Admitting: Critical Care Medicine

## 2014-02-28 MED ORDER — RIVAROXABAN 20 MG PO TABS: 20.0000 mg | ORAL_TABLET | Freq: Every day | ORAL | Status: DC

## 2014-02-28 NOTE — Telephone Encounter (Signed)
Empty refill encounter

## 2014-03-17 ENCOUNTER — Other Ambulatory Visit: Payer: Self-pay | Admitting: *Deleted

## 2014-03-17 DIAGNOSIS — C3490 Malignant neoplasm of unspecified part of unspecified bronchus or lung: Secondary | ICD-10-CM

## 2014-04-04 ENCOUNTER — Encounter: Payer: Medicare Other | Admitting: Cardiology

## 2014-04-05 ENCOUNTER — Ambulatory Visit (INDEPENDENT_AMBULATORY_CARE_PROVIDER_SITE_OTHER): Payer: Medicare Other | Admitting: Internal Medicine

## 2014-04-05 ENCOUNTER — Encounter: Payer: Self-pay | Admitting: Internal Medicine

## 2014-04-05 VITALS — BP 126/60 | HR 71 | Ht 62.0 in | Wt 144.0 lb

## 2014-04-05 DIAGNOSIS — I480 Paroxysmal atrial fibrillation: Secondary | ICD-10-CM

## 2014-04-05 DIAGNOSIS — I4891 Unspecified atrial fibrillation: Secondary | ICD-10-CM

## 2014-04-05 DIAGNOSIS — I495 Sick sinus syndrome: Secondary | ICD-10-CM

## 2014-04-05 LAB — MDC_IDC_ENUM_SESS_TYPE_INCLINIC
Brady Statistic RV Percent Paced: 0 %
Implantable Pulse Generator Serial Number: 1095
Lead Channel Sensing Intrinsic Amplitude: 12 mV
Lead Channel Setting Pacing Pulse Width: 0.4 ms
MDC IDC SET LEADCHNL RV PACING AMPLITUDE: 2.5 V
MDC IDC SET LEADCHNL RV SENSING SENSITIVITY: 2 mV

## 2014-04-05 MED ORDER — AMIODARONE HCL 100 MG PO TABS: 100.0000 mg | ORAL_TABLET | Freq: Every day | ORAL | Status: DC

## 2014-04-05 MED ORDER — DILTIAZEM HCL ER COATED BEADS 240 MG PO CP24: 240.0000 mg | ORAL_CAPSULE | Freq: Every day | ORAL | Status: DC

## 2014-04-05 MED ORDER — RIVAROXABAN 20 MG PO TABS: 20.0000 mg | ORAL_TABLET | Freq: Every day | ORAL | Status: DC

## 2014-04-05 NOTE — Patient Instructions (Addendum)
Your physician recommends that you follow-up with research in 6 months - Nancy Hodges will call you  Your physician wants you to follow-up in: 12 months with Dr Vallery Ridge will receive a reminder letter in the mail two months in advance. If you don't receive a letter, please call our office to schedule the follow-up appointment.

## 2014-04-05 NOTE — Progress Notes (Signed)
PPCP: Young Berry, MD  Nancy Hodges is a 68 y.o. female who presents today for routine electrophysiology followup.  She is doing very well.  Today, she denies symptoms of chest pain, shortness of breath,  lower extremity edema, dizziness, presyncope, or syncope. She has only very rare palpitations. The patient is otherwise without complaint today.   Past Medical History  Diagnosis Date  . Asthma   . History of sinusitis   . Allergic rhinitis   . History of kidney stones   . 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 H/O hiatal hernia   . GERD (gastroesophageal reflux disease)   . Pulmonary nodule   . Paroxysmal atrial fibrillation     CHADS score: 0  . Atrial flutter   . Tachycardia-bradycardia syndrome     a. 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  [name] Anxiety   . Headache(784.0)     sinus  . Lung cancer     a. Stage IB non-small cell carcinoma, s/p R VATS, wedge resection, RU lobectomy 10/2012.   Past Surgical History  Procedure Laterality Date  . Partial hysterectomy    . Pacemaker insertion  09/14/2012    Nanostim (SJM) leadless pacemaker (LEADLESS II STUDY PATIENT) implanted by Dr [name]  . Video assisted thoracoscopy (vats)/wedge resection Right 10/27/2012    Procedure: VIDEO ASSISTED THORACOSCOPY (VATS),RGHT UPPER LOBE LUNG WEDGE RESECTION;  Surgeon: [name], MD;  Location: [facility];  Service: Thoracic;  Laterality: Right;  . Lobectomy Right 10/27/2012    Procedure: LOBECTOMY;  Surgeon: [name], MD;  Location: [facility];  Service: Thoracic;  Laterality: Right;   RIGHT UPPER LOBECTOMY & Node Dissection    Current Outpatient Prescriptions  Medication Sig Dispense Refill  . acetaminophen (TYLENOL) 500 MG tablet Take 500 mg by mouth every 6 (six) hours as needed for pain.      [name] albuterol (PROVENTIL) (2.5 MG/3ML) 0.083% nebulizer solution Take 3 mLs (2.5 mg total) by nebulization every 2 (two) hours as needed for shortness of breath.   120 mL  0  . albuterol-ipratropium (COMBIVENT) 18-103 MCG/ACT inhaler Inhale 2 puffs into the lungs every 6 (six) hours as needed for wheezing.      [name] amiodarone (PACERONE) 100 MG tablet Take 1 tablet (100 mg total) by mouth daily.  30 tablet  11  . diltiazem (CARDIZEM CD) 240 MG 24 hr capsule Take 1 capsule (240 mg total) by mouth daily.  30 capsule  11  . fexofenadine (ALLEGRA) 180 MG tablet Take 180 mg by mouth daily.      . fluticasone (FLONASE) 50 MCG/ACT nasal spray Place 2 sprays into the nose daily as needed (for congestion).      [name] guaiFENesin (MUCINEX) 600 MG 12 hr tablet Take 600 mg by mouth daily as needed for cough or to loosen phlegm.      [name] guaiFENesin-dextromethorphan (ROBITUSSIN DM) 100-10 MG/5ML syrup Take 5 mLs by mouth 3 (three) times daily as needed for cough.  118 mL  0  . rivaroxaban (XARELTO) 20 MG TABS tablet Take 1 tablet (20 mg total) by mouth daily.  30 tablet  11   No current facility-administered medications for this visit.    Physical Exam: Filed Vitals:   04/05/14 1047  BP: 126/60  Pulse: 71  Height: 5\' 2"  (1.575 m)  Weight: 144 lb (65.318 kg)    GEN- The patient is well appearing, alert and oriented x 3 today.   Head- normocephalic, atraumatic  Eyes-  Sclera clear, conjunctiva pink Ears- hearing intact Oropharynx- clear Lungs- Clear to ausculation bilaterally, normal work of breathing Heart- Regular rate and rhythm, no murmurs, rubs or gallops, PMI not laterally displaced GI- soft, NT, ND, + BS Extremities- no clubbing, cyanosis, or edema  Pacemaker interrogation- reviewed in detail today,  See PACEART report  Assessment and Plan:  1. Tachycardia/ Bradycardia Normal pacemaker function See Pace Art report No changes today  2. afib continue xarelto 20mg  daily amiodarone to 100mg  daily She says that she would prefer for primary care to follow LFTs/TFTs and bmet   Return in 6 months

## 2014-04-12 ENCOUNTER — Telehealth: Payer: Self-pay | Admitting: Internal Medicine

## 2014-04-12 NOTE — Telephone Encounter (Addendum)
June had 2 polyps removed and the GI (Dr Amanda Pea) saw 2 internal hemorrhoids. She had labs done by her PCP and was told her hemoglobin is 9.9  They have asked her to call her Cardiologist to be seen.  I discussed with Dr [name] and we will have her follow up with Elberta Leatherwood, Pharm D in the anti coagulation clinic  She did have repeat labs today and I am going to get the results of these tomorrow and will get her an appointment with Gay Filler  Patient is aware

## 2014-04-12 NOTE — Telephone Encounter (Signed)
New message    Patient calling to discuss respond from her PCP told she anemia - possible to be bleeding inside.

## 2014-04-14 NOTE — Telephone Encounter (Signed)
Called patient back and her hemoglobin was 9.9 glucose 159 non fasting  hematocrit 30.9.  She is seeing Dr Koleen Nimrod on 9/22.  She is going to come her to have them redrawn.  She says the PCP told her not to worry today and she is not feeling good about her care with them.

## 2014-04-14 NOTE — Telephone Encounter (Signed)
Spoke with patient and she has not heard the results yet.  She will call them today.

## 2014-04-14 NOTE — Telephone Encounter (Signed)
Follow up ° ° ° ° °Want lab results °

## 2014-04-14 NOTE — Telephone Encounter (Signed)
Follow up     Patient calling back to speak with Claiborne Billings regarding blood work .

## 2014-04-25 ENCOUNTER — Encounter: Payer: Self-pay | Admitting: Thoracic Surgery (Cardiothoracic Vascular Surgery)

## 2014-04-25 ENCOUNTER — Ambulatory Visit (INDEPENDENT_AMBULATORY_CARE_PROVIDER_SITE_OTHER): Payer: Medicare Other | Admitting: Thoracic Surgery (Cardiothoracic Vascular Surgery)

## 2014-04-25 ENCOUNTER — Other Ambulatory Visit: Payer: Self-pay

## 2014-04-25 ENCOUNTER — Ambulatory Visit
Admission: RE | Admit: 2014-04-25 | Discharge: 2014-04-25 | Disposition: A | Discharge status: Home / Self Care | Payer: Medicare Other | Source: Ambulatory Visit | Attending: Thoracic Surgery (Cardiothoracic Vascular Surgery) | Admitting: Thoracic Surgery (Cardiothoracic Vascular Surgery)

## 2014-04-25 VITALS — BP 130/65 | HR 70 | Resp 20 | Ht 62.0 in | Wt 144.0 lb

## 2014-04-25 DIAGNOSIS — C3410 Malignant neoplasm of upper lobe, unspecified bronchus or lung: Secondary | ICD-10-CM

## 2014-04-25 DIAGNOSIS — C3490 Malignant neoplasm of unspecified part of unspecified bronchus or lung: Secondary | ICD-10-CM

## 2014-04-25 DIAGNOSIS — R590 Localized enlarged lymph nodes: Secondary | ICD-10-CM

## 2014-04-25 NOTE — Progress Notes (Signed)
HPI:  68 year old woman with a history of a thoracoscopic right upper lobectomy for a stage IB non-small cell carcinoma in March of 2014. The mass was discovered incidentally on a chest x-ray prior to a pacemaker implant. She was last in the office in March which time she was doing well with no evidence recurrent disease.  She says that she has not been feeling well lately. She has been fatigued. She said her recent blood work showed anemia. She had a recent colonoscopy and 2 polyps were removed. She has a cough with clear mucus which she attributes allergies. She has not had any hemoptysis. She's not had any significant weight loss. She says that she did have a mammogram about 4 months ago at [facility] and was told it was okay. She has not noticed any lumps or bumps on her body.  Past Medical History  Diagnosis Date  . Asthma   . History of sinusitis   . Allergic rhinitis   . History of kidney stones   . 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 H/O hiatal hernia   . GERD (gastroesophageal reflux disease)   . Pulmonary nodule   . Paroxysmal atrial fibrillation     CHADS score: 0  . Atrial flutter   . Tachycardia-bradycardia syndrome     a. 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  [name] Anxiety   . Headache(784.0)     sinus  . Lung cancer     a. Stage IB non-small cell carcinoma, s/p R VATS, wedge resection, RU lobectomy 10/2012.    Past Surgical History  Procedure Laterality Date  . Partial hysterectomy    . Pacemaker insertion  09/14/2012    Nanostim (SJM) leadless pacemaker (LEADLESS II STUDY PATIENT) implanted by Dr [name]  . Video assisted thoracoscopy (vats)/wedge resection Right 10/27/2012    Procedure: VIDEO ASSISTED THORACOSCOPY (VATS),RGHT UPPER LOBE LUNG WEDGE RESECTION;  Surgeon: [name], MD;  Location: [place];  Service: Thoracic;  Laterality: Right;  . Lobectomy Right 10/27/2012    Procedure: LOBECTOMY;  Surgeon: [name], MD;  Location: [place];  Service: Thoracic;  Laterality: Right;   RIGHT UPPER LOBECTOMY & Node Dissection    Current Outpatient Prescriptions  Medication Sig Dispense Refill  . acetaminophen (TYLENOL) 500 MG tablet Take 500 mg by mouth every 6 (six) hours as needed for pain.      [name] albuterol (PROVENTIL) (2.5 MG/3ML) 0.083% nebulizer solution Take 3 mLs (2.5 mg total) by nebulization every 2 (two) hours as needed for shortness of breath.  120 mL  0  . albuterol-ipratropium (COMBIVENT) 18-103 MCG/ACT inhaler Inhale 2 puffs into the lungs every 6 (six) hours as needed for wheezing.      [name] amiodarone (PACERONE) 100 MG tablet Take 1 tablet (100 mg total) by mouth daily.  30 tablet  11  . diltiazem (CARDIZEM CD) 240 MG 24 hr capsule Take 1 capsule (240 mg total) by mouth daily.  30 capsule  11  . fexofenadine (ALLEGRA) 180 MG tablet Take 180 mg by mouth daily.      . fluticasone (FLONASE) 50 MCG/ACT nasal spray Place 2 sprays into the nose daily as needed (for congestion).      [name] guaiFENesin (MUCINEX) 600 MG 12 hr tablet Take 600 mg by mouth daily as needed for cough or to loosen phlegm.      [name] guaiFENesin-dextromethorphan (ROBITUSSIN DM) 100-10 MG/5ML syrup Take 5 mLs by mouth 3 (three)  times daily as needed for cough.  118 mL  0  . rivaroxaban (XARELTO) 20 MG TABS tablet Take 1 tablet (20 mg total) by mouth daily.  30 tablet  11   No current facility-administered medications for this visit.   Family History  Problem Relation Age of Onset  . Heart disease Mother   . Cancer Mother   . Diabetes Mother   . Asthma Father   . Heart disease Father   . Cancer Brother   . Diabetes Sister   . Cancer Sister   . Diabetes Brother   . Heart attack Son    History   Social History  . Marital Status: Single    Spouse Name: N/A    Number of Children: N/A  . Years of Education: N/A   Occupational History  . Not on file.   Social History Main Topics  . Smoking status: Former Smoker --  2.00 packs/day for 40 years    Types: Cigarettes    Quit date: 08/05/1995  . Smokeless tobacco: Never Used  . Alcohol Use: No  . Drug Use: No  . Sexual Activity: Not on file   Other Topics Concern  . Not on file   Social History Narrative   Patient lives alone in [place].   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   Patient has 1 living daughter      Physical Exam  Diagnostic Tests: CT CHEST WITHOUT CONTRAST  TECHNIQUE:  Multidetector CT imaging of the chest was performed following the  standard protocol without IV contrast.  COMPARISON: Chest CT 10/25/2013.  FINDINGS:  Mediastinum: Heart size is mildly enlarged. Electronic device in the  right ventricular apex appears to represent an implantable  pacemaker. There is no significant pericardial fluid, thickening or  pericardial calcification. No pathologically enlarged mediastinal or  hilar lymph nodes. Please note that accurate exclusion of hilar  adenopathy is limited on noncontrast CT scans. Small hiatal hernia.  There is atherosclerosis of the thoracic aorta, the great vessels of  the mediastinum and the coronary arteries, including calcified  atherosclerotic plaque in the left anterior descending and left  circumflex coronary arteries.  Lungs/Pleura: Postoperative changes of right upper lobectomy. There  is some mild postoperative scarring and architectural distortion in  the periphery of the right lower and middle lobes. Narrowing of the  distal aspect of the bronchus intermedius and proximal aspects of  the right middle and lower lobe bronchi is noted, similar to the  prior examination. No large areas of atelectasis are identified at  this time, and no overt postobstructive changes are noted in the  right lung. There is a background of mild centrilobular emphysema.  No suspicious appearing pulmonary nodules or masses are noted.  Upper Abdomen: The spleen is incompletely visualized, but is clearly  enlarged  measuring at least 15.5 x 9.9 cm on axial images. Small  calcified granuloma in the liver.  Musculoskeletal: There are no aggressive appearing lytic or blastic  lesions noted in the visualized portions of the skeleton. Enlarged  right axillary lymph node, new compared to the prior study,  measuring 2.5 x 1.7 cm.  IMPRESSION:  1. Status post right upper lobectomy with stable postoperative  changes in the right hemithorax. No findings to suggest local  recurrence of disease or metastatic disease in the chest.  2. However, there is a new markedly enlarged right axillary lymph  node. This measures 2.5 x 1.7 cm. Correlation with mammography is  strongly recommended  in the near future to exclude an underlying  primary breast lesion.  3. Splenomegaly.  4. Mild centrilobular emphysema.  5. Atherosclerosis, including 2 vessel coronary artery disease.  Assessment for potential risk factor modification, dietary therapy  or pharmacologic therapy may be warranted, if clinically indicated.  6. Additional incidental findings, as above.  These results will be called to the ordering clinician or  representative by the Radiologist Assistant, and communication  documented in the PACS or zVision Dashboard.  Electronically Signed  By: Vinnie Langton M.D.  On: 04/25/2014 11:46   Impression: 67 year old woman with a history of a stage IB non-small cell carcinoma. Her CT today shows a markedly enlarged right axillary node. This is clearly palpable on exam. It is mobile and very mildly tender. This needs to be biopsied. We will plan to do that early next week under local with sedation.  I described the proposed procedure, right axillary lymph node biopsy, to Nancy Hodges. She understands the indications, risks, benefits, and alternatives. She understands the risks of bleeding and infection. She understands that this is a diagnostic and not a therapeutic procedure.  Plan:  Right axillary lymph node biopsy  using local anesthesia with sedation on Monday, 05/01/2014

## 2014-04-26 ENCOUNTER — Encounter (HOSPITAL_COMMUNITY): Payer: Self-pay | Admitting: Pharmacy Technician

## 2014-04-27 ENCOUNTER — Encounter (HOSPITAL_COMMUNITY)
Admission: RE | Admit: 2014-04-27 | Discharge: 2014-04-27 | Disposition: A | Discharge status: Home / Self Care | Payer: Medicare Other | Source: Ambulatory Visit | Attending: Thoracic Surgery (Cardiothoracic Vascular Surgery) | Admitting: Thoracic Surgery (Cardiothoracic Vascular Surgery)

## 2014-04-27 ENCOUNTER — Ambulatory Visit (HOSPITAL_COMMUNITY)
Admission: RE | Admit: 2014-04-27 | Discharge: 2014-04-27 | Disposition: A | Discharge status: Home / Self Care | Payer: Medicare Other | Source: Ambulatory Visit | Attending: Thoracic Surgery (Cardiothoracic Vascular Surgery) | Admitting: Thoracic Surgery (Cardiothoracic Vascular Surgery)

## 2014-04-27 ENCOUNTER — Encounter (HOSPITAL_COMMUNITY): Payer: Self-pay

## 2014-04-27 VITALS — BP 142/45 | HR 68 | Temp 98.5°F | Resp 20 | Ht 62.0 in | Wt 146.2 lb

## 2014-04-27 DIAGNOSIS — R0602 Shortness of breath: Secondary | ICD-10-CM | POA: Insufficient documentation

## 2014-04-27 DIAGNOSIS — R05 Cough: Secondary | ICD-10-CM | POA: Insufficient documentation

## 2014-04-27 DIAGNOSIS — R059 Cough, unspecified: Secondary | ICD-10-CM | POA: Insufficient documentation

## 2014-04-27 DIAGNOSIS — R590 Localized enlarged lymph nodes: Secondary | ICD-10-CM

## 2014-04-27 HISTORY — DX: Diverticulosis of intestine, part unspecified, without perforation or abscess without bleeding: K57.90

## 2014-04-27 HISTORY — DX: Pneumonia, unspecified organism: J18.9

## 2014-04-27 HISTORY — DX: Other hemorrhoids: K64.8

## 2014-04-27 HISTORY — DX: Unspecified osteoarthritis, unspecified site: M19.90

## 2014-04-27 LAB — COMPREHENSIVE METABOLIC PANEL
ALBUMIN: 3.3 g/dL — AB (ref 3.5–5.2)
ALT: 22 U/L (ref 0–35)
ANION GAP: 10 (ref 5–15)
AST: 34 U/L (ref 0–37)
Alkaline Phosphatase: 173 U/L — ABNORMAL HIGH (ref 39–117)
BUN: 12 mg/dL (ref 6–23)
CO2: 25 mEq/L (ref 19–32)
CREATININE: 0.6 mg/dL (ref 0.50–1.10)
Calcium: 9.5 mg/dL (ref 8.4–10.5)
Chloride: 103 mEq/L (ref 96–112)
GFR calc Af Amer: >90 mL/min (ref >90)
GFR calc non Af Amer: >90 mL/min (ref >90)
Glucose, Bld: 174 mg/dL — ABNORMAL HIGH (ref 70–99)
POTASSIUM: 4.2 meq/L (ref 3.7–5.3)
Sodium: 138 mEq/L (ref 137–147)
Total Bilirubin: 0.9 mg/dL (ref 0.3–1.2)
Total Protein: 7.5 g/dL (ref 6.0–8.3)

## 2014-04-27 LAB — CBC
HEMATOCRIT: 30.8 % — AB (ref 36.0–46.0)
Hemoglobin: 9.7 g/dL — ABNORMAL LOW (ref 12.0–15.0)
MCH: 27 pg (ref 26.0–34.0)
MCHC: 31.5 g/dL (ref 30.0–36.0)
MCV: 85.8 fL (ref 78.0–100.0)
Platelets: 253 10*3/uL (ref 150–400)
RBC: 3.59 MIL/uL — ABNORMAL LOW (ref 3.87–5.11)
RDW: 16.3 % — AB (ref 11.5–15.5)
WBC: 4.7 10*3/uL (ref 4.0–10.5)

## 2014-04-27 LAB — PROTIME-INR
INR: 2.07 — AB (ref 0.00–1.49)
PROTHROMBIN TIME: 23.3 s — AB (ref 11.6–15.2)

## 2014-04-27 LAB — APTT: APTT: 56 s — AB (ref 24–37)

## 2014-04-27 LAB — TYPE AND SCREEN
ABO/RH(D): O POS
ANTIBODY SCREEN: NEGATIVE

## 2014-04-27 NOTE — Pre-Procedure Instructions (Signed)
Nancy Hodges  04/27/2014   Your procedure is scheduled on:  Monday May 01, 2014 at 10:34 AM.  Report to [facility] Admitting at 8:30 AM.  Call this number if you have problems the morning of surgery: [phone]   Remember:   Do not eat food or drink liquids after midnight.   Take these medicines the morning of surgery with A SIP OF WATER: Acetaminophen (Tylenol) if needed, Albuterol nebulizer if needed, Combivent inhaler if needed, Amiodarone (Pacerone), Diltiazem (Cardizem), Fexofenadine (Allegra), Flonase nasal spray if needed   Do not wear jewelry, make-up or nail polish.  Do not wear lotions, powders, or perfumes. You may NOT wear deodorant.  Do not shave 48 hours prior to surgery.   Do not bring valuables to the hospital.  [facility] is not responsible for any belongings or valuables.               Contacts, dentures or bridgework may not be worn into surgery.  Leave suitcase in the car. After surgery it may be brought to your room.  For patients admitted to the hospital, discharge time is determined by your treatment team.               Patients discharged the day of surgery will not be allowed to drive home.  Name and phone number of your driver: Family/Friend  Special Instructions: Shower using CHG soap the night before and the morning of your surgery   Please read over the following fact sheets that you were given: Pain Booklet, Coughing and Deep Breathing, Blood Transfusion Information and Surgical Site Infection Prevention

## 2014-04-27 NOTE — Progress Notes (Signed)
Nancy Hodges, Nursing tech approached Nurse and told Nurse that while obtaining vital signs that patients blood pressure was 134/36. Nurse went to assess patient and patient explained to Nurse that while she was sitting in the chair and looking at the wall that she could see the "motion" of her heart beat on the wall. Patient stated "I can look at the wall and see my heart beat. I know it's hard to explain, but it has happened before and it comes and goes and I have no idea as to what causes it". She also stated that she recently had a office appointment with Dr. Roxan Hockey and mentioned it to him however he thought it may have been stress related. Nurse asked patient if she felt as though she needed to go to the ER, and patient stated "no." However at 1004 Nurse asked patient if she could still feel/see the "motion" of her heart beat and she stated it had gone away and she was "fine now." Patient denied having any acute chest pain or shortness of breath. Patient informed Nurse that she last used Albuterol nebulizer 2 days ago, and last used Combivent inhaler 2 days before last using the Albuterol inhaler. Patient stated she was instructed by Dr. Roxan Hockey to stop Xarelto a day or two before surgery and patient informed Nurse that she would stop taking it on Friday 04/28/14.    Perioperative device programming sheet faxed to Dr. [name].

## 2014-04-28 NOTE — Progress Notes (Signed)
Anesthesia chart review: Patient is a 68 year old female scheduled for right axillary lymph node biopsy on 05/01/14 by Dr. Roxan Hockey.  History includes [place] lung cancer s/p right VATS for RU lobectomy with mediastinal LN dissection 10/27/12, symptomatic tachy-brady syndrome with afib s/p leadless (SJM nannostim VVI investigational) pacemaker implantation 09/14/12, anemia (recent work-up with two colon polyps removed and diagnosis of internal hemorrhoids by Dr. Amanda Pea), asthma, GERD, hiatal hernia, anxiety, diverticulosis, bruises easily, hysterectomy. Patient reported her last dose of Xarelto before surgery would be 04/28/14. EP cardiologist is Dr. [name], last visit 04/05/14. PCP is listed as Dr. Young Berry.  EKG on 04/27/14 showed: NSR.  Echo on 08/05/12 showed: - Left ventricle: The cavity size was normal. There was mild focal basal hypertrophy of the septum. Systolic function was normal. The estimated ejection fraction was in the range of 55% to 60%. Wall motion was normal; there were no regional wall motion abnormalities. Doppler parameters are consistent with abnormal left ventricular relaxation (grade 1 diastolic dysfunction). - Aortic valve: Valve area: 1.91cm^2(VTI). Valve area: 2.08cm^2 (Vmax). - Mitral valve: Calcified annulus. Mild regurgitation. - Left atrium: The atrium was mildly dilated.  CXR impression from 04/27/14 showed:  1. No acute cardiopulmonary disease. No change from the prior chest radiograph.  2. Changes from right lung surgery.   Preoperative labs noted.  H/H 9.7/30.8.  PT/INR 23.3/2.07, PTT 56.  Glucose 174, no reported history of DM. T&S done. There are notes in Epic (see Madilyn Fireman, RN notations for 04/14/14) that report patient's recent H/H were 9.9/30.9 with recent GI work-up and notification to Dr. [name] since she is on Xarelto. Dr. Roxan Hockey is also aware of recent evaluation for anemia. Since she has known recent diagnosis of anemia with what sounds like an on-going  work-up and case is posted for LN biopsy only, I would anticipate that she could proceed as planned.  T&S was done, but will defer and decision for perioperatively transfusion to Dr. Roxan Hockey and/or her anesthesiologist. She will need a repeat PT/PTT on arrival.  I'll also order a CBG since her glucose was > 150.  George Hugh [facility] Stay [facility]/Anesthesiology Phone [phone] 04/28/2014 10:07 AM

## 2014-04-30 MED ORDER — VANCOMYCIN HCL IN DEXTROSE 1-5 GM/200ML-% IV SOLN
1000.0000 mg | INTRAVENOUS | Status: AC
  Administered 2014-05-01: 1000 mg via INTRAVENOUS
  Filled 2014-04-30: qty 200

## 2014-05-01 ENCOUNTER — Encounter (HOSPITAL_COMMUNITY)
Admission: RE | Disposition: A | Discharge status: Home / Self Care | Payer: Self-pay | Source: Ambulatory Visit | Attending: Thoracic Surgery (Cardiothoracic Vascular Surgery)

## 2014-05-01 ENCOUNTER — Ambulatory Visit (HOSPITAL_COMMUNITY): Payer: Medicare Other | Admitting: Certified Registered"

## 2014-05-01 ENCOUNTER — Ambulatory Visit (HOSPITAL_COMMUNITY)
Admission: RE | Admit: 2014-05-01 | Discharge: 2014-05-01 | Disposition: A | Discharge status: Home / Self Care | Payer: Medicare Other | Source: Ambulatory Visit | Attending: Thoracic Surgery (Cardiothoracic Vascular Surgery) | Admitting: Thoracic Surgery (Cardiothoracic Vascular Surgery)

## 2014-05-01 ENCOUNTER — Encounter (HOSPITAL_COMMUNITY): Payer: Medicare Other | Admitting: Emergency Medicine

## 2014-05-01 ENCOUNTER — Encounter (HOSPITAL_COMMUNITY): Payer: Self-pay | Admitting: *Deleted

## 2014-05-01 DIAGNOSIS — R599 Enlarged lymph nodes, unspecified: Secondary | ICD-10-CM | POA: Diagnosis present

## 2014-05-01 DIAGNOSIS — Z950 Presence of cardiac pacemaker: Secondary | ICD-10-CM | POA: Insufficient documentation

## 2014-05-01 DIAGNOSIS — C3410 Malignant neoplasm of upper lobe, unspecified bronchus or lung: Secondary | ICD-10-CM

## 2014-05-01 DIAGNOSIS — Z85118 Personal history of other malignant neoplasm of bronchus and lung: Secondary | ICD-10-CM | POA: Diagnosis not present

## 2014-05-01 DIAGNOSIS — I4891 Unspecified atrial fibrillation: Secondary | ICD-10-CM | POA: Diagnosis not present

## 2014-05-01 DIAGNOSIS — Z79899 Other long term (current) drug therapy: Secondary | ICD-10-CM | POA: Diagnosis not present

## 2014-05-01 DIAGNOSIS — I10 Essential (primary) hypertension: Secondary | ICD-10-CM | POA: Diagnosis not present

## 2014-05-01 DIAGNOSIS — Z87891 Personal history of nicotine dependence: Secondary | ICD-10-CM | POA: Insufficient documentation

## 2014-05-01 DIAGNOSIS — J449 Chronic obstructive pulmonary disease, unspecified: Secondary | ICD-10-CM | POA: Diagnosis not present

## 2014-05-01 DIAGNOSIS — K219 Gastro-esophageal reflux disease without esophagitis: Secondary | ICD-10-CM | POA: Diagnosis not present

## 2014-05-01 DIAGNOSIS — F411 Generalized anxiety disorder: Secondary | ICD-10-CM | POA: Insufficient documentation

## 2014-05-01 DIAGNOSIS — Z902 Acquired absence of lung [part of]: Secondary | ICD-10-CM | POA: Diagnosis not present

## 2014-05-01 DIAGNOSIS — C773 Secondary and unspecified malignant neoplasm of axilla and upper limb lymph nodes: Secondary | ICD-10-CM | POA: Insufficient documentation

## 2014-05-01 DIAGNOSIS — R590 Localized enlarged lymph nodes: Secondary | ICD-10-CM

## 2014-05-01 DIAGNOSIS — J4489 Other specified chronic obstructive pulmonary disease: Secondary | ICD-10-CM | POA: Insufficient documentation

## 2014-05-01 HISTORY — PX: LYMPH NODE BIOPSY: SHX201

## 2014-05-01 LAB — APTT: aPTT: 39 seconds — ABNORMAL HIGH (ref 24–37)

## 2014-05-01 LAB — PROTIME-INR
INR: 1.12 (ref 0.00–1.49)
Prothrombin Time: 14.4 seconds (ref 11.6–15.2)

## 2014-05-01 LAB — GLUCOSE, CAPILLARY: Glucose-Capillary: 130 mg/dL — ABNORMAL HIGH (ref 70–99)

## 2014-05-01 SURGERY — LYMPH NODE BIOPSY
Anesthesia: Monitor Anesthesia Care | Site: Axilla | Laterality: Right

## 2014-05-01 MED ORDER — FENTANYL CITRATE 0.05 MG/ML IJ SOLN
INTRAMUSCULAR | Status: DC | PRN
  Administered 2014-05-01: 50 ug via INTRAVENOUS
  Administered 2014-05-01: 25 ug via INTRAVENOUS

## 2014-05-01 MED ORDER — TRAMADOL HCL 50 MG PO TABS
ORAL_TABLET | ORAL | Status: AC
  Filled 2014-05-01: qty 1

## 2014-05-01 MED ORDER — OXYCODONE HCL 5 MG PO TABS: 5.0000 mg | ORAL_TABLET | Freq: Once | ORAL | Status: DC | PRN

## 2014-05-01 MED ORDER — OXYCODONE HCL 5 MG PO TABS: 5.0000 mg | ORAL_TABLET | ORAL | Status: DC | PRN

## 2014-05-01 MED ORDER — MEPERIDINE HCL 25 MG/ML IJ SOLN: 6.2500 mg | INTRAMUSCULAR | Status: DC | PRN

## 2014-05-01 MED ORDER — FENTANYL CITRATE 0.05 MG/ML IJ SOLN: 25.0000 ug | INTRAMUSCULAR | Status: DC | PRN

## 2014-05-01 MED ORDER — LIDOCAINE HCL (CARDIAC) 20 MG/ML IV SOLN
INTRAVENOUS | Status: DC | PRN
  Administered 2014-05-01: 20 mg via INTRAVENOUS

## 2014-05-01 MED ORDER — TRAMADOL HCL 50 MG PO TABS: 50.0000 mg | ORAL_TABLET | Freq: Four times a day (QID) | ORAL | Status: DC | PRN

## 2014-05-01 MED ORDER — LACTATED RINGERS IV SOLN
INTRAVENOUS | Status: DC
  Administered 2014-05-01: 09:00:00 via INTRAVENOUS

## 2014-05-01 MED ORDER — CHLORHEXIDINE GLUCONATE 4 % EX LIQD
1.0000 "application " | Freq: Once | CUTANEOUS | Status: DC
  Filled 2014-05-01: qty 15

## 2014-05-01 MED ORDER — PROPOFOL 10 MG/ML IV BOLUS
INTRAVENOUS | Status: AC
  Filled 2014-05-01: qty 20

## 2014-05-01 MED ORDER — LIDOCAINE HCL (CARDIAC) 20 MG/ML IV SOLN
INTRAVENOUS | Status: AC
  Filled 2014-05-01: qty 5

## 2014-05-01 MED ORDER — TRAMADOL HCL 50 MG PO TABS
50.0000 mg | ORAL_TABLET | Freq: Once | ORAL | Status: AC
  Administered 2014-05-01: 50 mg via ORAL

## 2014-05-01 MED ORDER — LIDOCAINE-EPINEPHRINE (PF) 1 %-1:200000 IJ SOLN
INTRAMUSCULAR | Status: DC | PRN
  Administered 2014-05-01: 30 mL

## 2014-05-01 MED ORDER — MIDAZOLAM HCL 5 MG/5ML IJ SOLN
INTRAMUSCULAR | Status: DC | PRN
  Administered 2014-05-01: 1 mg via INTRAVENOUS

## 2014-05-01 MED ORDER — ONDANSETRON HCL 4 MG/2ML IJ SOLN
INTRAMUSCULAR | Status: DC | PRN
  Administered 2014-05-01: 4 mg via INTRAVENOUS

## 2014-05-01 MED ORDER — PROMETHAZINE HCL 25 MG/ML IJ SOLN: 6.2500 mg | INTRAMUSCULAR | Status: DC | PRN

## 2014-05-01 MED ORDER — ACETAMINOPHEN 325 MG PO TABS: 650.0000 mg | ORAL_TABLET | Freq: Four times a day (QID) | ORAL | Status: DC | PRN

## 2014-05-01 MED ORDER — OXYCODONE HCL 5 MG/5ML PO SOLN: 5.0000 mg | Freq: Once | ORAL | Status: DC | PRN

## 2014-05-01 MED ORDER — PROPOFOL INFUSION 10 MG/ML OPTIME
INTRAVENOUS | Status: DC | PRN
Start: 2014-05-01 — End: 2014-05-01
  Administered 2014-05-01: 50 ug/kg/min via INTRAVENOUS

## 2014-05-01 MED ORDER — FENTANYL CITRATE 0.05 MG/ML IJ SOLN
INTRAMUSCULAR | Status: AC
  Filled 2014-05-01: qty 5

## 2014-05-01 MED ORDER — MIDAZOLAM HCL 2 MG/2ML IJ SOLN: 0.5000 mg | Freq: Once | INTRAMUSCULAR | Status: DC | PRN

## 2014-05-01 MED ORDER — LACTATED RINGERS IV SOLN
INTRAVENOUS | Status: DC | PRN
  Administered 2014-05-01: 11:00:00 via INTRAVENOUS

## 2014-05-01 MED ORDER — 0.9 % SODIUM CHLORIDE (POUR BTL) OPTIME
TOPICAL | Status: DC | PRN
  Administered 2014-05-01: 1000 mL

## 2014-05-01 MED ORDER — MIDAZOLAM HCL 2 MG/2ML IJ SOLN
INTRAMUSCULAR | Status: AC
  Filled 2014-05-01: qty 2

## 2014-05-01 MED ORDER — LIDOCAINE-EPINEPHRINE (PF) 1 %-1:200000 IJ SOLN
INTRAMUSCULAR | Status: AC
  Filled 2014-05-01: qty 10

## 2014-05-01 MED ORDER — OXYCODONE HCL 5 MG PO TABS
ORAL_TABLET | ORAL | Status: AC
  Filled 2014-05-01: qty 1

## 2014-05-01 SURGICAL SUPPLY — 39 items
CANISTER SUCTION 2500CC (MISCELLANEOUS) ×3 IMPLANT
CLIP TI MEDIUM 6 (CLIP) ×3 IMPLANT
CLIP TI WIDE RED SMALL 6 (CLIP) ×3 IMPLANT
CONT SPEC 4OZ CLIKSEAL STRL BL (MISCELLANEOUS) ×3 IMPLANT
COVER SURGICAL LIGHT HANDLE (MISCELLANEOUS) ×6 IMPLANT
DERMABOND ADVANCED (GAUZE/BANDAGES/DRESSINGS) ×2
DERMABOND ADVANCED .7 DNX12 (GAUZE/BANDAGES/DRESSINGS) ×1 IMPLANT
DRAPE CHEST BREAST 15X10 FENES (DRAPES) ×3 IMPLANT
DRAPE LAPAROSCOPIC ABDOMINAL (DRAPES) ×3 IMPLANT
ELECT CAUTERY BLADE 6.4 (BLADE) ×3 IMPLANT
ELECT REM PT RETURN 9FT ADLT (ELECTROSURGICAL) ×3
ELECTRODE REM PT RTRN 9FT ADLT (ELECTROSURGICAL) ×1 IMPLANT
GAUZE SPONGE 4X4 12PLY STRL (GAUZE/BANDAGES/DRESSINGS) ×3 IMPLANT
GLOVE BIO SURGEON STRL SZ 6 (GLOVE) ×3 IMPLANT
GLOVE BIOGEL PI IND STRL 6.5 (GLOVE) ×1 IMPLANT
GLOVE BIOGEL PI INDICATOR 6.5 (GLOVE) ×2
GLOVE EUDERMIC 7 POWDERFREE (GLOVE) ×3 IMPLANT
GOWN STRL REUS W/ TWL LRG LVL3 (GOWN DISPOSABLE) ×2 IMPLANT
GOWN STRL REUS W/TWL LRG LVL3 (GOWN DISPOSABLE) ×4
KIT BASIN OR (CUSTOM PROCEDURE TRAY) ×3 IMPLANT
KIT ROOM TURNOVER OR (KITS) ×3 IMPLANT
NEEDLE 22X1 1/2 (OR ONLY) (NEEDLE) IMPLANT
NS IRRIG 1000ML POUR BTL (IV SOLUTION) ×3 IMPLANT
PACK GENERAL/GYN (CUSTOM PROCEDURE TRAY) ×3 IMPLANT
PAD ARMBOARD 7.5X6 YLW CONV (MISCELLANEOUS) ×6 IMPLANT
SPONGE GAUZE 4X4 12PLY STER LF (GAUZE/BANDAGES/DRESSINGS) ×3 IMPLANT
SPONGE INTESTINAL PEANUT (DISPOSABLE) ×3 IMPLANT
SUT SILK 2 0 TIES 10X30 (SUTURE) ×3 IMPLANT
SUT VIC AB 2-0 CT1 27 (SUTURE) ×2
SUT VIC AB 2-0 CT1 TAPERPNT 27 (SUTURE) ×1 IMPLANT
SUT VIC AB 3-0 SH 27 (SUTURE) ×8
SUT VIC AB 3-0 SH 27X BRD (SUTURE) ×4 IMPLANT
SUT VIC AB 3-0 X1 27 (SUTURE) ×3 IMPLANT
SUT VICRYL 4-0 PS2 18IN ABS (SUTURE) ×6 IMPLANT
SYR CONTROL 10ML LL (SYRINGE) IMPLANT
TAPE CLOTH SURG 4X10 WHT LF (GAUZE/BANDAGES/DRESSINGS) ×3 IMPLANT
TOWEL OR 17X24 6PK STRL BLUE (TOWEL DISPOSABLE) ×3 IMPLANT
TOWEL OR 17X26 10 PK STRL BLUE (TOWEL DISPOSABLE) ×3 IMPLANT
WATER STERILE IRR 1000ML POUR (IV SOLUTION) ×3 IMPLANT

## 2014-05-01 NOTE — Anesthesia Postprocedure Evaluation (Signed)
  Anesthesia Post-op Note  Patient: Nancy Hodges  Procedure(s) Performed: Procedure(s): LYMPH NODE BIOPSY, Right Axillary (Right)  Patient Location: PACU  Anesthesia Type:MAC  Level of Consciousness: awake, alert , oriented and patient cooperative  Airway and Oxygen Therapy: Patient Spontanous Breathing  Post-op Pain: mild  Post-op Assessment: Post-op Vital signs reviewed, Patient's Cardiovascular Status Stable, Respiratory Function Stable, Patent Airway, No signs of Nausea or vomiting and Pain level controlled  Post-op Vital Signs: Reviewed and stable  Last Vitals:  Filed Vitals:   05/01/14 1215  BP: 120/35  Pulse: 65  Temp:   Resp: 19    Complications: No apparent anesthesia complications

## 2014-05-01 NOTE — Interval H&P Note (Signed)
History and Physical Interval Note:  05/01/2014 10:37 AM  Nancy Hodges  has presented today for surgery, with the diagnosis of Right axillary lymph node adenopathy  The various methods of treatment have been discussed with the patient and family. After consideration of risks, benefits and other options for treatment, the patient has consented to  Procedure(s): LYMPH NODE BIOPSY, Right Axillary (Right) as a surgical intervention .  The patient's history has been reviewed, patient examined, no change in status, stable for surgery.  I have reviewed the patient's chart and labs.  Questions were answered to the patient's satisfaction.     Bobbye Petti C

## 2014-05-01 NOTE — Transfer of Care (Signed)
Immediate Anesthesia Transfer of Care Note  Patient: Nancy Hodges  Procedure(s) Performed: Procedure(s): LYMPH NODE BIOPSY, Right Axillary (Right)  Patient Location: PACU  Anesthesia Type:MAC  Level of Consciousness: awake, alert  and oriented  Airway & Oxygen Therapy: Patient Spontanous Breathing  Post-op Assessment: Report given to PACU RN, Post -op Vital signs reviewed and stable and Patient moving all extremities X 4  Post vital signs: Reviewed and stable  Complications: No apparent anesthesia complications

## 2014-05-01 NOTE — Brief Op Note (Signed)
05/01/2014  12:09 PM  PATIENT:  Jarome Matin Pohlman  68 y.o. female  PRE-OPERATIVE DIAGNOSIS:  Right axillary lymph node adenopathy  POST-OPERATIVE DIAGNOSIS:  Right axillary lymph node adenopathy- adenocarcinoma  PROCEDURE:  Procedure(s): LYMPH NODE BIOPSY, Right Axillary (Right)  SURGEON:  Surgeon(s) and Role:    * [name], MD - Primary  ANESTHESIA:   local and IV sedation  EBL:  Total I/O In: 800 [I.V.:800] Out: -   BLOOD ADMINISTERED:none  DRAINS: none   LOCAL MEDICATIONS USED:  LIDOCAINE  and Amount: 17 ml  SPECIMEN:  Source of Specimen:  right axillary lymph node  DISPOSITION OF SPECIMEN:  PATHOLOGY  COUNTS:  YES  PLAN OF CARE: Discharge to home after PACU  PATIENT DISPOSITION:  PACU - hemodynamically stable.   Delay start of Pharmacological VTE agent (>24hrs) due to surgical blood loss or risk of bleeding: not applicable  Frozen- adenocarcinoma lung vs breast primary

## 2014-05-01 NOTE — Anesthesia Preprocedure Evaluation (Addendum)
Anesthesia Evaluation  Patient identified by MRN, date of birth, ID band Patient awake    Reviewed: Allergy & Precautions, H&P , NPO status , Patient's Chart, lab work & pertinent test results  History of Anesthesia Complications Negative for: history of anesthetic complications  Airway Mallampati: II TM Distance: >3 FB Neck ROM: Full    Dental  (+) Poor Dentition, Missing, Edentulous Upper, Dental Advisory Given   Pulmonary asthma , COPD COPD inhaler, former smoker (quit 1997),  Non-small cell lung Ca: s/p RULobectomy breath sounds clear to auscultation        Cardiovascular hypertension, Pt. on medications - angina+ dysrhythmias (xarelto) Atrial Fibrillation + pacemaker (for tachy-brady) Rhythm:Irregular Rate:Normal  '14 ECHO: EF 03-21%, grade 1 diastolic dysfunction, valves OK   Neuro/Psych  Headaches,    GI/Hepatic Neg liver ROS, hiatal hernia, GERD-  Medicated and Controlled,  Endo/Other  negative endocrine ROS  Renal/GU negative Renal ROS     Musculoskeletal   Abdominal   Peds  Hematology  (+) Blood dyscrasia (Hb 9.8, Xarelto), ,   Anesthesia Other Findings   Reproductive/Obstetrics                         Anesthesia Physical Anesthesia Plan  ASA: III  Anesthesia Plan: MAC   Post-op Pain Management:    Induction:   Airway Management Planned: Simple Face Mask and Natural Airway  Additional Equipment:   Intra-op Plan:   Post-operative Plan:   Informed Consent: I have reviewed the patients History and Physical, chart, labs and discussed the procedure including the risks, benefits and alternatives for the proposed anesthesia with the patient or authorized representative who has indicated his/her understanding and acceptance.   Dental advisory given  Plan Discussed with: CRNA and Surgeon  Anesthesia Plan Comments: (Plan routine monitors, MAC)        Anesthesia Quick  Evaluation

## 2014-05-01 NOTE — Discharge Instructions (Addendum)
Do not drive or engage in heavy physical activity for 24 hours  Do not lift anything over 10 pounds for a week.  You may shower tomorrow  You have a prescription for a pain medication. It is a narcotic- do not drive while taking it  You may use Tylenol or ibuprofen in addition to or instead of the prescription pain medication  There is a medical adhesive over the incision. It will begin to peel off in 7-10 days  My office will contact you with a follow up information  Call [phone] if you develop chest pain, shortness of breath, fever > 101, drainage from the incision or redness around the incision  What to eat:  For your first meals, you should eat lightly; only small meals initially.  If you do not have nausea, you may eat larger meals.  Avoid spicy, greasy and heavy food.    General Anesthesia, Adult, Care After  Refer to this sheet in the next few weeks. These instructions provide you with information on caring for yourself after your procedure. Your health care provider may also give you more specific instructions. Your treatment has been planned according to current medical practices, but problems sometimes occur. Call your health care provider if you have any problems or questions after your procedure.  WHAT TO EXPECT AFTER THE PROCEDURE  After the procedure, it is typical to experience:  Sleepiness.  Nausea and vomiting. HOME CARE INSTRUCTIONS  For the first 24 hours after general anesthesia:  Have a responsible person with you.  Do not drive a car. If you are alone, do not take public transportation.  Do not drink alcohol.  Do not take medicine that has not been prescribed by your health care provider.  Do not sign important papers or make important decisions.  You may resume a normal diet and activities as directed by your health care provider.  Change bandages (dressings) as directed.  If you have questions or problems that seem related to general anesthesia, call the  hospital and ask for the anesthetist or anesthesiologist on call. SEEK MEDICAL CARE IF:  You have nausea and vomiting that continue the day after anesthesia.  You develop a rash. SEEK IMMEDIATE MEDICAL CARE IF:  You have difficulty breathing.  You have chest pain.  You have any allergic problems. Document Released: 10/27/2000 Document Revised: 03/23/2013 Document Reviewed: 02/03/2013  [facility] Patient Information 2014 [place], [place].

## 2014-05-01 NOTE — H&P (View-Only) (Signed)
HPI:  68 year old woman with a history of a thoracoscopic right upper lobectomy for a stage IB non-small cell carcinoma in March of 2014. The mass was discovered incidentally on a chest x-ray prior to a pacemaker implant. She was last in the office in March which time she was doing well with no evidence recurrent disease.  She says that she has not been feeling well lately. She has been fatigued. She said her recent blood work showed anemia. She had a recent colonoscopy and 2 polyps were removed. She has a cough with clear mucus which she attributes allergies. She has not had any hemoptysis. She's not had any significant weight loss. She says that she did have a [facility] about 4 months ago [facility] and was told it was okay. She has not noticed any lumps or bumps on her body.  Past Medical History  Diagnosis Date  . Asthma   . History of sinusitis   . Allergic rhinitis   . History of kidney stones   . 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 H/O hiatal hernia   . GERD (gastroesophageal reflux disease)   . Pulmonary nodule   . Paroxysmal atrial fibrillation     CHADS score: 0  . Atrial flutter   . Tachycardia-bradycardia syndrome     a. 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  [name] Anxiety   . Headache(784.0)     sinus  . Lung cancer     a. Stage IB non-small cell carcinoma, s/p R VATS, wedge resection, RU lobectomy 10/2012.    Past Surgical History  Procedure Laterality Date  . Partial hysterectomy    . Pacemaker insertion  09/14/2012    Nanostim (SJM) leadless pacemaker (LEADLESS II STUDY PATIENT) implanted by Dr [name]  . Video assisted thoracoscopy (vats)/wedge resection Right 10/27/2012    Procedure: VIDEO ASSISTED THORACOSCOPY (VATS),RGHT UPPER LOBE LUNG WEDGE RESECTION;  Surgeon: [name], MD;  Location: [facility];  Service: Thoracic;  Laterality: Right;  . Lobectomy Right 10/27/2012    Procedure: LOBECTOMY;  Surgeon: [name], MD;  Location: [facility];  Service: Thoracic;  Laterality: Right;   RIGHT UPPER LOBECTOMY & Node Dissection    Current Outpatient Prescriptions  Medication Sig Dispense Refill  . acetaminophen (TYLENOL) 500 MG tablet Take 500 mg by mouth every 6 (six) hours as needed for pain.      [name] albuterol (PROVENTIL) (2.5 MG/3ML) 0.083% nebulizer solution Take 3 mLs (2.5 mg total) by nebulization every 2 (two) hours as needed for shortness of breath.  120 mL  0  . albuterol-ipratropium (COMBIVENT) 18-103 MCG/ACT inhaler Inhale 2 puffs into the lungs every 6 (six) hours as needed for wheezing.      [name] amiodarone (PACERONE) 100 MG tablet Take 1 tablet (100 mg total) by mouth daily.  30 tablet  11  . diltiazem (CARDIZEM CD) 240 MG 24 hr capsule Take 1 capsule (240 mg total) by mouth daily.  30 capsule  11  . fexofenadine (ALLEGRA) 180 MG tablet Take 180 mg by mouth daily.      . fluticasone (FLONASE) 50 MCG/ACT nasal spray Place 2 sprays into the nose daily as needed (for congestion).      [name] guaiFENesin (MUCINEX) 600 MG 12 hr tablet Take 600 mg by mouth daily as needed for cough or to loosen phlegm.      [name] guaiFENesin-dextromethorphan (ROBITUSSIN DM) 100-10 MG/5ML syrup Take 5 mLs by mouth 3 (three)  times daily as needed for cough.  118 mL  0  . rivaroxaban (XARELTO) 20 MG TABS tablet Take 1 tablet (20 mg total) by mouth daily.  30 tablet  11   No current facility-administered medications for this visit.   Family History  Problem Relation Age of Onset  . Heart disease Mother   . Cancer Mother   . Diabetes Mother   . Asthma Father   . Heart disease Father   . Cancer Brother   . Diabetes Sister   . Cancer Sister   . Diabetes Brother   . Heart attack Son    History   Social History  . Marital Status: Single    Spouse Name: N/A    Number of Children: N/A  . Years of Education: N/A   Occupational History  . Not on file.   Social History Main Topics  . Smoking status: Former Smoker --  2.00 packs/day for 40 years    Types: Cigarettes    Quit date: 08/05/1995  . Smokeless tobacco: Never Used  . Alcohol Use: No  . Drug Use: No  . Sexual Activity: Not on file   Other Topics Concern  . Not on file   Social History Narrative   Patient lives alone in [place].   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   Patient has 1 living daughter      Physical Exam  Diagnostic Tests: CT CHEST WITHOUT CONTRAST  TECHNIQUE:  Multidetector CT imaging of the chest was performed following the  standard protocol without IV contrast.  COMPARISON: Chest CT 10/25/2013.  FINDINGS:  Mediastinum: Heart size is mildly enlarged. Electronic device in the  right ventricular apex appears to represent an implantable  pacemaker. There is no significant pericardial fluid, thickening or  pericardial calcification. No pathologically enlarged mediastinal or  hilar lymph nodes. Please note that accurate exclusion of hilar  adenopathy is limited on noncontrast CT scans. Small hiatal hernia.  There is atherosclerosis of the thoracic aorta, the great vessels of  the mediastinum and the coronary arteries, including calcified  atherosclerotic plaque in the left anterior descending and left  circumflex coronary arteries.  Lungs/Pleura: Postoperative changes of right upper lobectomy. There  is some mild postoperative scarring and architectural distortion in  the periphery of the right lower and middle lobes. Narrowing of the  distal aspect of the bronchus intermedius and proximal aspects of  the right middle and lower lobe bronchi is noted, similar to the  prior examination. No large areas of atelectasis are identified at  this time, and no overt postobstructive changes are noted in the  right lung. There is a background of mild centrilobular emphysema.  No suspicious appearing pulmonary nodules or masses are noted.  Upper Abdomen: The spleen is incompletely visualized, but is clearly  enlarged  measuring at least 15.5 x 9.9 cm on axial images. Small  calcified granuloma in the liver.  Musculoskeletal: There are no aggressive appearing lytic or blastic  lesions noted in the visualized portions of the skeleton. Enlarged  right axillary lymph node, new compared to the prior study,  measuring 2.5 x 1.7 cm.  IMPRESSION:  1. Status post right upper lobectomy with stable postoperative  changes in the right hemithorax. No findings to suggest local  recurrence of disease or metastatic disease in the chest.  2. However, there is a new markedly enlarged right axillary lymph  node. This measures 2.5 x 1.7 cm. Correlation with mammography is  strongly recommended  in the near future to exclude an underlying  primary breast lesion.  3. Splenomegaly.  4. Mild centrilobular emphysema.  5. Atherosclerosis, including 2 vessel coronary artery disease.  Assessment for potential risk factor modification, dietary therapy  or pharmacologic therapy may be warranted, if clinically indicated.  6. Additional incidental findings, as above.  These results will be called to the ordering clinician or  representative by the Radiologist Assistant, and communication  documented in the PACS or zVision Dashboard.  Electronically Signed  By: Vinnie Langton M.D.  On: 04/25/2014 11:46   Impression: 68 year old woman with a history of a stage IB non-small cell carcinoma. Her CT today shows a markedly enlarged right axillary node. This is clearly palpable on exam. It is mobile and very mildly tender. This needs to be biopsied. We will plan to do that early next week under local with sedation.  I described the proposed procedure, right axillary lymph node biopsy, to Mrs. Olivera. She understands the indications, risks, benefits, and alternatives. She understands the risks of bleeding and infection. She understands that this is a diagnostic and not a therapeutic procedure.  Plan:  Right axillary lymph node biopsy  using local anesthesia with sedation on Monday, 05/01/2014

## 2014-05-02 NOTE — Op Note (Signed)
NAMEAMARISE, Hodges NO.:  [id number]  MEDICAL RECORD NO.:  [id number]  LOCATION:  [facility]                         FACILITY:  [facility]  PHYSICIAN:  Revonda Standard. Roxan Hockey, M.D.DATE OF BIRTH:  1946/07/22  DATE OF PROCEDURE:  05/01/2014 DATE OF DISCHARGE:  05/01/2014                              OPERATIVE REPORT   PREOPERATIVE DIAGNOSIS:  Right axillary adenopathy.  POSTOPERATIVE DIAGNOSIS:  Metastatic adenocarcinoma.  PROCEDURE:  Right axillary lymph node biopsy.  SURGEON:  Revonda Standard. Roxan Hockey, M.D.  ASSISTANT:  None.  ANESTHESIA:  Local with intravenous sedation (17 mL of 1% lidocaine with epinephrine.)  FINDINGS:  A 2.5 cm round encapsulated lymph node.  Frozen section revealed adenocarcinoma suspicious for breast primary.  CLINICAL NOTE:  [name] is a 69 year old woman with a history of non- small cell lung cancer.  She was treated with a wedge resection and node Dissection in 2014. This was a stage IB lesion.  She recently presented for followup and had a CT of the chest.  On exam, she had a palpable right axillary lymph node.  CT confirmed the presence of the node.  She was advised to undergo lymph node biopsy.  The indications, risks, benefits, and alternatives were discussed in detail with the patient.  She understood and accepted the risks and agreed to proceed.  OPERATIVE NOTE:  [name] was brought to the operating room on May 01, 2014.  She had intravenous sedation monitored and administered by the Anesthesia Service.  The right axilla was prepped and draped in the usual sterile fashion.  The node was palpated and the skin overlying the node was anesthetized with 1% lidocaine with epinephrine.  The deeper tissues surrounding the lymph node likewise were anesthetized.  An incision was made.  It was carried through the skin and subcutaneous tissue.  The dissection was carried down to the node.  This was a 2.5 cm firm slightly irregular  node that was well encapsulated.  Feeding blood vessels were clipped and divided.  The node was removed and sent for frozen section.  The wound was copiously irrigated.  Hemostasis was achieved.  The wound was closed with 3-0 Vicryl interrupted subcutaneous sutures and a 4-0 Vicryl subcuticular suture.  Dermabond was applied. The patient then was taken from the operating room to the postanesthetic care unit in good condition.  All sponge, needle, and instrument counts were correct.     Revonda Standard Roxan Hockey, M.D.     SCH/MEDQ  D:  05/01/2014  T:  05/02/2014  Job:  [id number]

## 2014-05-02 NOTE — Telephone Encounter (Signed)
Spoke with patient and she is doing okay.  Waiting to hear back from Dr Roxan Hockey in regards to follow up with oncology

## 2014-05-03 ENCOUNTER — Encounter (HOSPITAL_COMMUNITY): Payer: Self-pay | Admitting: Thoracic Surgery (Cardiothoracic Vascular Surgery)

## 2014-05-05 ENCOUNTER — Other Ambulatory Visit: Payer: Self-pay | Admitting: *Deleted

## 2014-05-05 ENCOUNTER — Telehealth: Payer: Self-pay | Admitting: *Deleted

## 2014-05-05 ENCOUNTER — Telehealth: Payer: Self-pay | Admitting: Internal Medicine

## 2014-05-05 DIAGNOSIS — C3490 Malignant neoplasm of unspecified part of unspecified bronchus or lung: Secondary | ICD-10-CM | POA: Insufficient documentation

## 2014-05-05 DIAGNOSIS — C3491 Malignant neoplasm of unspecified part of right bronchus or lung: Secondary | ICD-10-CM

## 2014-05-05 NOTE — Telephone Encounter (Signed)
Called patient with appt for thoracic clinic on 05/11/14 at 12:30.  She verbalized understanding of appt time and location

## 2014-05-05 NOTE — Telephone Encounter (Signed)
New message      Want Nancy Hodges to know that pt sees Dr Julien Nordmann on Oct 8th

## 2014-05-05 NOTE — Telephone Encounter (Addendum)
Spoke with patient and she is very anxious about her upcoming appointment.  She is just going to call Dr Roxan Hockey and get the results of pathology of lymph node.

## 2014-05-09 ENCOUNTER — Other Ambulatory Visit: Payer: Self-pay | Admitting: *Deleted

## 2014-05-09 ENCOUNTER — Other Ambulatory Visit: Payer: Self-pay | Admitting: Internal Medicine

## 2014-05-09 ENCOUNTER — Telehealth: Payer: Self-pay | Admitting: *Deleted

## 2014-05-09 DIAGNOSIS — C3491 Malignant neoplasm of unspecified part of right bronchus or lung: Secondary | ICD-10-CM

## 2014-05-09 MED ORDER — DILTIAZEM HCL ER COATED BEADS 240 MG PO CP24: 240.0000 mg | ORAL_CAPSULE | Freq: Every day | ORAL | Status: DC

## 2014-05-09 NOTE — Telephone Encounter (Signed)
Called and spoke with pt.  I informed her of her head and PET scan.  She verbalized understanding of appt

## 2014-05-09 NOTE — Telephone Encounter (Signed)
Spoke with tech at [employer] [facility] and she stated that they did receive the year supply refill for the diltiazem in September, but for some reason it was cancelled. I will re-send this.

## 2014-05-11 ENCOUNTER — Encounter: Payer: Self-pay | Admitting: Internal Medicine

## 2014-05-11 ENCOUNTER — Ambulatory Visit (INDEPENDENT_AMBULATORY_CARE_PROVIDER_SITE_OTHER): Payer: Medicare Other | Admitting: Adult Health

## 2014-05-11 ENCOUNTER — Telehealth: Payer: Self-pay | Admitting: Internal Medicine

## 2014-05-11 ENCOUNTER — Ambulatory Visit (HOSPITAL_BASED_OUTPATIENT_CLINIC_OR_DEPARTMENT_OTHER): Payer: Medicare Other | Admitting: Internal Medicine

## 2014-05-11 ENCOUNTER — Encounter: Payer: Self-pay | Admitting: *Deleted

## 2014-05-11 ENCOUNTER — Encounter (INDEPENDENT_AMBULATORY_CARE_PROVIDER_SITE_OTHER): Payer: Self-pay

## 2014-05-11 ENCOUNTER — Other Ambulatory Visit (HOSPITAL_BASED_OUTPATIENT_CLINIC_OR_DEPARTMENT_OTHER): Payer: Medicare Other

## 2014-05-11 ENCOUNTER — Encounter: Payer: Self-pay | Admitting: Adult Health

## 2014-05-11 ENCOUNTER — Ambulatory Visit: Payer: Medicare Other | Attending: Internal Medicine | Admitting: Physical Therapy

## 2014-05-11 VITALS — BP 118/78 | HR 70 | Temp 98.2°F | Ht 62.0 in | Wt 149.6 lb

## 2014-05-11 VITALS — BP 138/51 | HR 72 | Temp 98.4°F | Resp 17 | Ht 62.0 in | Wt 146.3 lb

## 2014-05-11 DIAGNOSIS — C3411 Malignant neoplasm of upper lobe, right bronchus or lung: Secondary | ICD-10-CM

## 2014-05-11 DIAGNOSIS — J449 Chronic obstructive pulmonary disease, unspecified: Secondary | ICD-10-CM

## 2014-05-11 DIAGNOSIS — Z87891 Personal history of nicotine dependence: Secondary | ICD-10-CM

## 2014-05-11 DIAGNOSIS — C3491 Malignant neoplasm of unspecified part of right bronchus or lung: Secondary | ICD-10-CM

## 2014-05-11 DIAGNOSIS — Z8051 Family history of malignant neoplasm of kidney: Secondary | ICD-10-CM

## 2014-05-11 DIAGNOSIS — C799 Secondary malignant neoplasm of unspecified site: Secondary | ICD-10-CM

## 2014-05-11 DIAGNOSIS — Z801 Family history of malignant neoplasm of trachea, bronchus and lung: Secondary | ICD-10-CM

## 2014-05-11 DIAGNOSIS — C773 Secondary and unspecified malignant neoplasm of axilla and upper limb lymph nodes: Secondary | ICD-10-CM

## 2014-05-11 DIAGNOSIS — Z23 Encounter for immunization: Secondary | ICD-10-CM

## 2014-05-11 LAB — COMPREHENSIVE METABOLIC PANEL (CC13)
ALBUMIN: 3.4 g/dL — AB (ref 3.5–5.0)
ALK PHOS: 155 U/L — AB (ref 40–150)
ALT: 26 U/L (ref 0–55)
AST: 37 U/L — AB (ref 5–34)
Anion Gap: 5 mEq/L (ref 3–11)
BILIRUBIN TOTAL: 1 mg/dL (ref 0.20–1.20)
BUN: 13.3 mg/dL (ref 7.0–26.0)
CO2: 29 mEq/L (ref 22–29)
Calcium: 9.7 mg/dL (ref 8.4–10.4)
Chloride: 106 mEq/L (ref 98–109)
Creatinine: 0.7 mg/dL (ref 0.6–1.1)
Glucose: 96 mg/dl (ref 70–140)
POTASSIUM: 4.4 meq/L (ref 3.5–5.1)
SODIUM: 141 meq/L (ref 136–145)
TOTAL PROTEIN: 7.9 g/dL (ref 6.4–8.3)

## 2014-05-11 LAB — CBC WITH DIFFERENTIAL/PLATELET
BASO%: 1.2 % (ref 0.0–2.0)
Basophils Absolute: 0.1 10*3/uL (ref 0.0–0.1)
EOS ABS: 0.2 10*3/uL (ref 0.0–0.5)
EOS%: 3.2 % (ref 0.0–7.0)
HCT: 32.5 % — ABNORMAL LOW (ref 34.8–46.6)
HGB: 10.2 g/dL — ABNORMAL LOW (ref 11.6–15.9)
LYMPH%: 27.1 % (ref 14.0–49.7)
MCH: 27 pg (ref 25.1–34.0)
MCHC: 31.5 g/dL (ref 31.5–36.0)
MCV: 85.7 fL (ref 79.5–101.0)
MONO#: 0.7 10*3/uL (ref 0.1–0.9)
MONO%: 11.3 % (ref 0.0–14.0)
NEUT%: 57.2 % (ref 38.4–76.8)
NEUTROS ABS: 3.4 10*3/uL (ref 1.5–6.5)
PLATELETS: 265 10*3/uL (ref 145–400)
RBC: 3.79 10*6/uL (ref 3.70–5.45)
RDW: 17.4 % — AB (ref 11.2–14.5)
WBC: 5.9 10*3/uL (ref 3.9–10.3)
lymph#: 1.6 10*3/uL (ref 0.9–3.3)

## 2014-05-11 NOTE — Assessment & Plan Note (Signed)
Stage IB non small cell carcinoma 10/2012 s/p R VATS, wedge resection, RU lobectomy  04/2014 BX  w/ metastatic adenocarcinoma with right axillary lymph node  Pt is to follow up with Dr. Inda Merlin as planned today  PET and CT head for next week.

## 2014-05-11 NOTE — Progress Notes (Signed)
   Subjective:    Patient ID: Nancy Hodges, female    DOB: 11-22-1945, 68 y.o.   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   05/11/2014 Acute OV  Pt returns for follow up of her COPD -mild in nature vs chronic bronchitis .  Does report some increased SOB, prod cough with clear mucus, head congestion with PND with the weather change.  Has a lot of drainage in throat , feels she has to cough it up all the time. No orthopnea, discolored mucus , hemoptysis, f/c/s, n/v/d.   Pt with recent follow up with Dr. Roxan Hockey, TCTS, for follow up for lung cancer . CT showed a enlarged right axillary node requiring subsequent bx that revealed metastatic adenocarcinoma.  Has ov with Dr. Inda Merlin today to discuss further treatment .  PET and CT head set up for next week.   Spirometry today shows preserved lung function with little change from last year.  FEV1- 72%, ratio 77, decreased mid flows ~39%  Previously on symbicort last year did not feel it helped change her breathing . Uses combivent and albuterol neb As needed  -says she uses maybe couple times a week .        Review of Systems Constitutional:   No  weight loss, night sweats,  Fevers, chills,  +fatigue, or  lassitude.  HEENT:   No headaches,  Difficulty swallowing,  Tooth/dental problems, or  Sore throat,                No sneezing, itching, ear ache,  +nasal congestion, post nasal drip,   CV:  No chest pain,  Orthopnea, PND, swelling in lower extremities, anasarca, dizziness, palpitations, syncope.   GI  No heartburn, indigestion, abdominal pain, nausea, vomiting, diarrhea, change in bowel habits, loss of appetite, bloody stools.   Resp:    No chest wall deformity  Skin: no rash or lesions.  GU: no dysuria, change in color  of urine, no urgency or frequency.  No flank pain, no hematuria   MS:  No joint pain or swelling.  No decreased range of motion.  No back pain.  Psych:  No change in mood or affect. No depression or anxiety.  No memory loss.         Objective:   Physical Exam GEN: A/Ox3; pleasant , NAD elderly   HEENT:  [place]/AT,  EACs-clear, TMs-wnl, NOSE-clear drainage  THROAT-clear, no lesions, no postnasal drip or exudate noted.   NECK:  Supple w/ fair ROM; no JVD; normal carotid impulses w/o bruits; no thyromegaly or nodules palpated; no lymphadenopathy.  RESP  Clear  P & A; w/o, wheezes/ rales/ or rhonchi.no accessory muscle use, no dullness to percussion  CARD:  RRR, no m/r/g  , no peripheral edema, pulses intact, no cyanosis or clubbing.  GI:   Soft & nt; nml bowel sounds; no organomegaly or masses detected.  Musco: Warm bil, no deformities or joint swelling noted.   Neuro: alert, no focal deficits noted.    Skin: Warm, no lesions or rashes Along right axillary healing incision , well approximated w/ no redness, tender to touch          Assessment & Plan:

## 2014-05-11 NOTE — Assessment & Plan Note (Addendum)
Mild flare with AR  Spirometry today with preserved lung fxn and min aiflow obstruction except in small airways.  Treat for triggers .   Plan  Begin Allegra 180mg  daily for drainage in throat  Deslym 2 tsp Twice daily  As needed  Cough.  Begin Symbicort 80 2 puffs Twice daily  , until sample is gone, please rinse after use.  Flu shot today  Follow up Dr. Joya Gaskins  In 6 weeks .  Follow up with Dr. Inda Merlin today as planned.  Please contact office for sooner follow up if symptoms do not improve or worsen or seek emergency care

## 2014-05-11 NOTE — Progress Notes (Signed)
[place] Clinical Social Work  Clinical Social Work met with patient and Futures trader at [employer] appointment to offer support and assess for psychosocial needs.  Medical oncologist reviewed patient's diagnosis and recommended treatment plan with patient. Ms. Quashie was tearful during visit- she shared her main concern at this time is sharing the diagnosis with her daughter.  Patient reports her daughter lost her father to lung cancer a few months ago.  Ms. Gomm relies heavily on her faith to "get through tough times".  The patient lives alone in [place], [place] ([place]  Ms. Yerby does not drive, her neighbor drives her to all appointments.  CSW encouraged patient to call if she would like information re: transportation grants.     Clinical Social Work briefly discussed Clinical Social Work role and [employer] support programs/services.  Clinical Social Work encouraged patient to call with any additional questions or concerns.   Polo Riley, MSW, LCSW, OSW-C Clinical Social Worker [facility] [phone]

## 2014-05-11 NOTE — Progress Notes (Signed)
Spoke with patient today at thoracic clinic.  Treatment plan is for patient to have more scans for work up and then come back to speak with Dr. Julien Nordmann for follow up.  I gave her information on lung cancer and resources at [facility].

## 2014-05-11 NOTE — Telephone Encounter (Signed)
[place] pt - no pof sent. s/w Hinton Dyer and per Hinton Dyer give pt copy of schedule for 10/14 scans and let her know we will get back to her re f/u w/MM for about 2 1/2 wks.

## 2014-05-11 NOTE — Patient Instructions (Signed)
Begin Allegra 180mg  daily for drainage in throat  Deslym 2 tsp Twice daily  As needed  Cough.  Begin Symbicort 80 2 puffs Twice daily  , until sample is gone, please rinse after use.  Flu shot today  Follow up Dr. Joya Gaskins  In 6 weeks .  Follow up with Dr. Inda Merlin today as planned.  Please contact office for sooner follow up if symptoms do not improve or worsen or seek emergency care

## 2014-05-11 NOTE — Progress Notes (Unsigned)
   Thoracic Treatment Summary Name:Nancy Hodges Date:05/11/2014 DOB:1946/04/15 Your Medical Team Medical Oncologist:Dr. Mohamed  Type and Stage of Lung Cancer Non-Small Cell Carcinoma: Adenocarcinoma  Clinical Stage: Recurrence No matching staging information was found for the patient.   Clinical stage is based on radiology exams.  Pathological stage will be determined after surgery.  Staging is based on the size of the tumor, involvement of lymph nodes or not, and whether or not the cancer center has spread. Recommendations Recommendations: Further scans for more information then possible chemotherapy  These recommendations are based on information available as of today's consult.  This is subject to change depending further testing or exams. Next Steps Next Step: Medical Oncology will set up follow up appointments Barriers to Care What do you perceive as a potential barrier that may prevent you from receiving your treatment plan? Education IT trainer will set up with Development worker, community aware  Resources Given: [place] on [employer] at [employer].org 1-(651)673-6589 {Blank single:19197::"NCI Booklet on Lung Cancer","Support/Resources Services at [place].org 1-(651)673-6589","What to expect at [facility] information"," "}   Questions Norton Blizzard, RN BSN Thoracic Oncology Nurse Navigator at [employer] is a nurse navigator that is available to assist you through your cancer journey.  She can answer your questions and/or provide resources regarding your treatment plan, emotional support, or financial concerns.

## 2014-05-13 NOTE — Progress Notes (Signed)
[place] [phone]   [phone]  CONSULT NOTE  REFERRING PHYSICIAN: Dr. Modesto Charon  REASON FOR CONSULTATION:  68 years old white female recently diagnosed with metastatic lung cancer.  HPI Nancy Hodges is a 68 y.o. female with past medical history significant for her paroxysmal atrial fibrillation, asthma, kidney stone, GERD, anxiety and long history of smoking but quit 15 years ago. The patient was also diagnosed in March of 2014 with Stage IB lung cancer during preoperative evaluation for a pacemaker placement. She underwent Right video-assisted thoracoscopy, wedge resection, right upper lobectomy, mediastinal lymph node dissection under the care of Dr. Roxan Hockey on 10/27/2012. The final pathology showed invasive moderately differentiated adenocarcinoma measuring 1.6 CM with lymphovascular invasion and visceral pleural invasion identified. The tumor was tested for EGFR mutation and the ALK gene translocation. The available report showed negative ALK gene translocation but I don't see the results for the EGFR mutation. She was followed by observation. The previous imaging studies showed no evidence for disease recurrence. On 04/25/2014 the patient had CT scan of the chest without contrast and it showed a new markedly enlarged right axillary lymph node measuring 2.5 x 1.7 CM. She was seen by Dr. Roxan Hockey and she underwent right axillary lymph node excisional biopsy. The final pathology (Accession: [id number]) was positive for metastatic adenocarcinoma. In the current specimen, there is extensive involvement of lymph node by metastatic adenocarcinoma. The adenocarcinoma demonstrates the following immunophenotype: TTF-1 - strong diffuse expression, GCDFP - negative expression, Estrogen receptor - negative expression Napsin-A - strong diffuse expression, Cytokeratin 7 - strong diffuse expression, Cytokeratin 20 - negative expression, Overall,  the morphology and immunophenotype are that of metastatic adenocarcinoma, primary to lung. Dr. Roxan Hockey kindly referred the patient to me today for evaluation and recommendation regarding treatment of her newly metastatic disease. When seen today the patient is feeling fine with no specific complaints except for mild soreness in the right axilla and shortness breath with exertion. She also has cough productive of clear sputum but no significant chest pain or hemoptysis. The patient denied having any significant weight loss or night sweats. She has no headache or visual changes. Family history significant for mother with heart disease and kidney cancer. Father had heart disease and she had a sister with lung cancer. The patient is single and has one living daughter. She has a history of smoking more than one pack per day for around 40 years but quit 15 years ago. She has no history of alcohol or drug abuse.   HPI  Past Medical History  Diagnosis Date  . Asthma   . History of sinusitis   . Allergic rhinitis   . History of kidney stones   . 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 H/O hiatal hernia   . GERD (gastroesophageal reflux disease)   . Pulmonary nodule   . Paroxysmal atrial fibrillation     CHADS score: 0  . Atrial flutter   . Tachycardia-bradycardia syndrome     a. 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  [name] Anxiety   . Headache(784.0)     sinus  . Lung cancer     a. Stage IB non-small cell carcinoma, s/p R VATS, wedge resection, RU lobectomy 10/2012.  . Diverticulosis   . Internal hemorrhoid   . Dysrhythmia   . Pneumonia June 2014  . Arthritis   . Thin skin   . Bruises easily     patient on Xarelto  Past Surgical History  Procedure Laterality Date  . Partial hysterectomy    . Pacemaker insertion  09/14/2012    Nanostim (SJM) leadless pacemaker (LEADLESS II STUDY PATIENT) implanted by Dr [name]  . Video assisted thoracoscopy (vats)/wedge resection  Right 10/27/2012    Procedure: VIDEO ASSISTED THORACOSCOPY (VATS),RGHT UPPER LOBE LUNG WEDGE RESECTION;  Surgeon: [name], MD;  Location: [facility];  Service: Thoracic;  Laterality: Right;  . Lobectomy Right 10/27/2012    Procedure: LOBECTOMY;  Surgeon: [name], MD;  Location: [facility];  Service: Thoracic;  Laterality: Right;   RIGHT UPPER LOBECTOMY & Node Dissection  . Abdominal hysterectomy    . Insert / replace / remove pacemaker      St. Jude  . Colonoscopy w/ polypectomy  2015  . Lymph node biopsy Right 05/01/2014    Procedure: LYMPH NODE BIOPSY, Right Axillary;  Surgeon: [name], MD;  Location: [facility];  Service: Thoracic;  Laterality: Right;    Family History  Problem Relation Age of Onset  . Heart disease Mother   . Cancer Mother   . Diabetes Mother   . Asthma Father   . Heart disease Father   . Cancer Brother   . Diabetes Sister   . Cancer Sister   . Diabetes Brother   . Heart attack Son     Social History History  Substance Use Topics  . Smoking status: Former Smoker -- 2.00 packs/day for 40 years    Types: Cigarettes    Quit date: 08/05/1995  . Smokeless tobacco: Never Used  . Alcohol Use: No    Allergies  Allergen Reactions  . Prednisone Shortness Of Breath and Other (See Comments)    REACTION: double vision  . Septra [Sulfamethoxazole-Trimethoprim] Shortness Of Breath  . Penicillins Swelling and Rash  . Vioxx [Rofecoxib] Other (See Comments)    REACTION:  Stomach cramps    Current Outpatient Prescriptions  Medication Sig Dispense Refill  . acetaminophen (TYLENOL) 500 MG tablet Take 500 mg by mouth every 6 (six) hours as needed for pain.      [name] albuterol (PROVENTIL) (2.5 MG/3ML) 0.083% nebulizer solution Take 3 mLs (2.5 mg total) by nebulization every 2 (two) hours as needed for shortness of breath.  120 mL  0  . albuterol-ipratropium (COMBIVENT) 18-103 MCG/ACT inhaler Inhale 2 puffs into the lungs every 6 (six) hours as needed  for wheezing.      [name] amiodarone (PACERONE) 100 MG tablet Take 1 tablet (100 mg total) by mouth daily.  30 tablet  11  . diltiazem (CARDIZEM CD) 240 MG 24 hr capsule Take 1 capsule (240 mg total) by mouth daily.  30 capsule  10  . fexofenadine (ALLEGRA) 180 MG tablet Take 180 mg by mouth daily.      . fluticasone (FLONASE) 50 MCG/ACT nasal spray Place 2 sprays into the nose daily as needed (for congestion).      [name] guaiFENesin (MUCINEX) 600 MG 12 hr tablet Take 600 mg by mouth daily as needed for cough or to loosen phlegm.      [name] guaiFENesin-dextromethorphan (ROBITUSSIN DM) 100-10 MG/5ML syrup Take 5 mLs by mouth 3 (three) times daily as needed for cough.  118 mL  0  . rivaroxaban (XARELTO) 20 MG TABS tablet Take 1 tablet (20 mg total) by mouth daily.  30 tablet  11  . traMADol (ULTRAM) 50 MG tablet Take 1-2 tablets (50-100 mg total) by mouth every 6 (six) hours as needed (pain).  30 tablet  0   No current facility-administered medications for this visit.    Review of Systems  Constitutional: positive for anorexia and fatigue Eyes: negative Ears, nose, mouth, throat, and face: negative Respiratory: positive for cough and dyspnea on exertion Cardiovascular: negative Gastrointestinal: negative Genitourinary:negative Integument/breast: negative Hematologic/lymphatic: negative Musculoskeletal:negative Neurological: negative Behavioral/Psych: negative Endocrine: negative Allergic/Immunologic: negative  Physical Exam  ITG:PQDIY, healthy, no distress, well nourished and well developed SKIN: skin color, texture, turgor are normal, no rashes or significant lesions HEAD: Normocephalic, No masses, lesions, tenderness or abnormalities EYES: normal, PERRLA EARS: External ears normal, Canals clear OROPHARYNX:no exudate, no erythema and lips, buccal mucosa, and tongue normal  NECK: supple, no adenopathy, no JVD LYMPH:  no palpable lymphadenopathy, no hepatosplenomegaly BREAST:not  examined LUNGS: clear to auscultation , and palpation HEART: regular rate & rhythm, no murmurs and no gallops ABDOMEN:abdomen soft, non-tender, normal bowel sounds and no masses or organomegaly BACK: Back symmetric, no curvature., No CVA tenderness EXTREMITIES:no joint deformities, effusion, or inflammation, no edema, no skin discoloration, no clubbing  NEURO: alert & oriented x 3 with fluent speech, no focal motor/sensory deficits  PERFORMANCE STATUS: ECOG 1  LABORATORY DATA: Lab Results  Component Value Date   WBC 5.9 05/11/2014   HGB 10.2* 05/11/2014   HCT 32.5* 05/11/2014   MCV 85.7 05/11/2014   PLT 265 05/11/2014      Chemistry      Component Value Date/Time   NA 141 05/11/2014 1222   NA 138 04/27/2014 1031   K 4.4 05/11/2014 1222   K 4.2 04/27/2014 1031   CL 103 04/27/2014 1031   CO2 29 05/11/2014 1222   CO2 25 04/27/2014 1031   BUN 13.3 05/11/2014 1222   BUN 12 04/27/2014 1031   CREATININE 0.7 05/11/2014 1222   CREATININE 0.60 04/27/2014 1031      Component Value Date/Time   CALCIUM 9.7 05/11/2014 1222   CALCIUM 9.5 04/27/2014 1031   ALKPHOS 155* 05/11/2014 1222   ALKPHOS 173* 04/27/2014 1031   AST 37* 05/11/2014 1222   AST 34 04/27/2014 1031   ALT 26 05/11/2014 1222   ALT 22 04/27/2014 1031   BILITOT 1.00 05/11/2014 1222   BILITOT 0.9 04/27/2014 1031       RADIOGRAPHIC STUDIES: Chest 2 View  04/27/2014   CLINICAL DATA:  Cough.  Short of breath.  EXAM: CHEST  2 VIEW  COMPARISON:  Chest CT, 04/25/2014.  Chest radiograph, 12/28/2013.  FINDINGS: Postsurgical changes on the right are noted with a pulmonary anastomosis staple line and vascular clips superimposed over the right hilum. There is volume loss on the right. Minor scarring is noted in the right lung base and medial right upper lobe along the pulmonary anastomosis staple line. This is stable.  Left lung is hyperexpanded. No lung consolidation or edema. No pulmonary mass or nodule.  No pleural effusion or pneumothorax.  Cardiac  silhouette is normal in size. No mediastinal or hilar masses or evidence of adenopathy.  Bony thorax is demineralized but intact.  IMPRESSION: 1. No acute cardiopulmonary disease. No change from the prior chest radiograph. 2. Changes from right lung surgery.   Electronically Signed   By: Lajean Manes M.D.   On: 04/27/2014 10:47   Ct Chest Wo Contrast  04/25/2014   CLINICAL DATA:  History of right sided lung cancer, status post right upper lobectomy.  EXAM: CT CHEST WITHOUT CONTRAST  TECHNIQUE: Multidetector CT imaging of the chest was performed following the standard protocol without IV  contrast.  COMPARISON:  Chest CT 10/25/2013.  FINDINGS: Mediastinum: Heart size is mildly enlarged. Electronic device in the right ventricular apex appears to represent an implantable pacemaker. There is no significant pericardial fluid, thickening or pericardial calcification. No pathologically enlarged mediastinal or hilar lymph nodes. Please note that accurate exclusion of hilar adenopathy is limited on noncontrast CT scans. Small hiatal hernia. There is atherosclerosis of the thoracic aorta, the great vessels of the mediastinum and the coronary arteries, including calcified atherosclerotic plaque in the left anterior descending and left circumflex coronary arteries.  Lungs/Pleura: Postoperative changes of right upper lobectomy. There is some mild postoperative scarring and architectural distortion in the periphery of the right lower and middle lobes. Narrowing of the distal aspect of the bronchus intermedius and proximal aspects of the right middle and lower lobe bronchi is noted, similar to the prior examination. No large areas of atelectasis are identified at this time, and no overt postobstructive changes are noted in the right lung. There is a background of mild centrilobular emphysema. No suspicious appearing pulmonary nodules or masses are noted.  Upper Abdomen: The spleen is incompletely visualized, but is clearly  enlarged measuring at least 15.5 x 9.9 cm on axial images. Small calcified granuloma in the liver.  Musculoskeletal: There are no aggressive appearing lytic or blastic lesions noted in the visualized portions of the skeleton. Enlarged right axillary lymph node, new compared to the prior study, measuring 2.5 x 1.7 cm.  IMPRESSION: 1. Status post right upper lobectomy with stable postoperative changes in the right hemithorax. No findings to suggest local recurrence of disease or metastatic disease in the chest. 2. However, there is a new markedly enlarged right axillary lymph node. This measures 2.5 x 1.7 cm. Correlation with mammography is strongly recommended in the near future to exclude an underlying primary breast lesion. 3. Splenomegaly. 4. Mild centrilobular emphysema. 5. Atherosclerosis, including 2 vessel coronary artery disease. Assessment for potential risk factor modification, dietary therapy or pharmacologic therapy may be warranted, if clinically indicated. 6. Additional incidental findings, as above. These results will be called to the ordering clinician or representative by the Radiologist Assistant, and communication documented in the PACS or zVision Dashboard.   Electronically Signed   By: Vinnie Langton M.D.   On: 04/25/2014 11:46    ASSESSMENT: This is a very pleasant 68 years old white female recently diagnosed with metastatic non-small cell lung cancer, adenocarcinoma. This was initially diagnosed as stage IB (T2a, N0, M0) In March of 2014, status post right upper lobectomy with lymph node dissection but the patient has evidence for disease recurrence in the right axilla status post resection.   PLAN: I had a lengthy discussion with the patient today about her current disease stage, prognosis and treatment options. I explained to the patient that she has stage IV disease and technically this is not curable but after the excision of the right axillary metastatic lymph node, she may be  cured if there is no evidence for other metastatic disease but definitely she will need at least 4 cycles of adjuvant chemotherapy. She may also benefit from adjuvant radiotherapy to the right axilla since there was some evidence for extracapsular extension based on discussion with the pathologist. Dr. Tammi Klippel will followup on this issue and consider the patient for radiation if she has no other evidence of metastatic disease. The patient had a PET scan as well as MRI of the brain scheduled for evaluation of her disease and to rule out any other  metastatic lesions.  If she has evidence for other metastatic disease, she would be considered for standard palliative chemotherapy. I will also check with the pathology Department about the status of her EGFR mutation. I will arrange for the patient a followup appointment in one-2 weeks for evaluation and discussion of her treatment options based on the final restaging workup. She was advised to call immediately if she has any concerning symptoms in the interval. The patient was seen doing the multidisciplinary thoracic oncology clinic today by medical oncology, thoracic navigator, social worker, survivorship coordinator as well as physical therapy.  The patient voices understanding of current disease status and treatment options and is in agreement with the current care plan.  All questions were answered. The patient knows to call the clinic with any problems, questions or concerns. We can certainly see the patient much sooner if necessary.  Thank you so much for allowing me to participate in the care of Nancy Hodges. I will continue to follow up the patient with you and assist in her care.  I spent 40 minutes counseling the patient face to face. The total time spent in the appointment was 60 minutes.  Disclaimer: This note was dictated with voice recognition software. Similar sounding words can inadvertently be transcribed and may not be corrected  upon review.   Nancy Hodges K. 05/13/2014, 11:15 AM

## 2014-05-15 ENCOUNTER — Telehealth: Payer: Self-pay | Admitting: Internal Medicine

## 2014-05-15 NOTE — Telephone Encounter (Signed)
s/w pt re appt for 10/22. no other orders per 10/10 pof

## 2014-05-16 ENCOUNTER — Other Ambulatory Visit (HOSPITAL_COMMUNITY)
Admission: RE | Admit: 2014-05-16 | Discharge: 2014-05-16 | Disposition: A | Discharge status: Home / Self Care | Payer: Medicare Other | Source: Ambulatory Visit | Attending: Internal Medicine | Admitting: Internal Medicine

## 2014-05-16 DIAGNOSIS — C3490 Malignant neoplasm of unspecified part of unspecified bronchus or lung: Secondary | ICD-10-CM | POA: Insufficient documentation

## 2014-05-17 ENCOUNTER — Ambulatory Visit (HOSPITAL_COMMUNITY)
Admission: RE | Admit: 2014-05-17 | Discharge: 2014-05-17 | Disposition: A | Discharge status: Home / Self Care | Payer: Medicare Other | Source: Ambulatory Visit | Attending: Thoracic Surgery (Cardiothoracic Vascular Surgery) | Admitting: Thoracic Surgery (Cardiothoracic Vascular Surgery)

## 2014-05-17 ENCOUNTER — Other Ambulatory Visit: Payer: Self-pay | Admitting: *Deleted

## 2014-05-17 ENCOUNTER — Encounter (HOSPITAL_COMMUNITY): Payer: Self-pay

## 2014-05-17 ENCOUNTER — Encounter (HOSPITAL_COMMUNITY)
Admission: RE | Admit: 2014-05-17 | Discharge: 2014-05-17 | Disposition: A | Discharge status: Home / Self Care | Payer: Medicare Other | Source: Ambulatory Visit | Attending: Diagnostic Radiology | Admitting: Diagnostic Radiology

## 2014-05-17 DIAGNOSIS — C773 Secondary and unspecified malignant neoplasm of axilla and upper limb lymph nodes: Secondary | ICD-10-CM | POA: Insufficient documentation

## 2014-05-17 DIAGNOSIS — Z79899 Other long term (current) drug therapy: Secondary | ICD-10-CM | POA: Diagnosis not present

## 2014-05-17 DIAGNOSIS — Z902 Acquired absence of lung [part of]: Secondary | ICD-10-CM | POA: Diagnosis not present

## 2014-05-17 DIAGNOSIS — C799 Secondary malignant neoplasm of unspecified site: Secondary | ICD-10-CM | POA: Insufficient documentation

## 2014-05-17 DIAGNOSIS — C3491 Malignant neoplasm of unspecified part of right bronchus or lung: Secondary | ICD-10-CM | POA: Insufficient documentation

## 2014-05-17 LAB — GLUCOSE, CAPILLARY: GLUCOSE-CAPILLARY: 129 mg/dL — AB (ref 70–99)

## 2014-05-17 MED ORDER — FLUDEOXYGLUCOSE F - 18 (FDG) INJECTION
7.2000 | Freq: Once | INTRAVENOUS | Status: AC | PRN
  Administered 2014-05-17: 7.2 via INTRAVENOUS

## 2014-05-17 MED ORDER — IOHEXOL 300 MG/ML  SOLN
100.0000 mL | Freq: Once | INTRAMUSCULAR | Status: AC | PRN
  Administered 2014-05-17: 100 mL via INTRAVENOUS

## 2014-05-18 ENCOUNTER — Telehealth: Payer: Self-pay | Admitting: Nurse Practitioner

## 2014-05-18 ENCOUNTER — Telehealth: Payer: Self-pay | Admitting: Internal Medicine

## 2014-05-18 NOTE — Telephone Encounter (Signed)
Added F/C apt after Dr.Mohamed per 05/18/14 [facility]

## 2014-05-18 NOTE — Telephone Encounter (Signed)
Fax of sample received 05/18/14; [facility] reference number [id number]

## 2014-05-22 ENCOUNTER — Telehealth: Payer: Self-pay | Admitting: *Deleted

## 2014-05-22 NOTE — Telephone Encounter (Signed)
Received message from Dr. Tammi Klippel stating he would like to see patient if Dr. Julien Nordmann considered her for chemotherapy.  I follow up with Dr. Julien Nordmann and he is considering her for chemo.  I set up appt with Dr. Tammi Klippel to see patient at thoracic clinic on 05/23/14 at 1:00.  She verbalized understanding.

## 2014-05-24 ENCOUNTER — Encounter (HOSPITAL_COMMUNITY): Payer: Self-pay

## 2014-05-25 ENCOUNTER — Ambulatory Visit: Payer: Medicare Other

## 2014-05-25 ENCOUNTER — Ambulatory Visit
Admission: RE | Admit: 2014-05-25 | Discharge: 2014-05-25 | Disposition: A | Discharge status: Home / Self Care | Payer: Medicare Other | Source: Ambulatory Visit | Attending: Radiation Oncology | Admitting: Radiation Oncology

## 2014-05-25 ENCOUNTER — Telehealth: Payer: Self-pay | Admitting: Internal Medicine

## 2014-05-25 ENCOUNTER — Encounter: Payer: Self-pay | Admitting: Internal Medicine

## 2014-05-25 ENCOUNTER — Ambulatory Visit (HOSPITAL_BASED_OUTPATIENT_CLINIC_OR_DEPARTMENT_OTHER): Payer: Medicare Other | Admitting: Internal Medicine

## 2014-05-25 ENCOUNTER — Encounter: Payer: Self-pay | Admitting: Radiation Oncology

## 2014-05-25 VITALS — BP 149/46 | HR 72 | Temp 98.3°F | Resp 18 | Ht 62.0 in | Wt 146.1 lb

## 2014-05-25 VITALS — BP 134/54 | HR 75 | Temp 98.1°F | Resp 17 | Ht 62.0 in | Wt 146.0 lb

## 2014-05-25 DIAGNOSIS — C773 Secondary and unspecified malignant neoplasm of axilla and upper limb lymph nodes: Secondary | ICD-10-CM | POA: Diagnosis not present

## 2014-05-25 DIAGNOSIS — C3411 Malignant neoplasm of upper lobe, right bronchus or lung: Secondary | ICD-10-CM | POA: Diagnosis present

## 2014-05-25 DIAGNOSIS — C3491 Malignant neoplasm of unspecified part of right bronchus or lung: Secondary | ICD-10-CM

## 2014-05-25 MED ORDER — CYANOCOBALAMIN 1000 MCG/ML IJ SOLN
1000.0000 ug | Freq: Once | INTRAMUSCULAR | Status: AC
  Administered 2014-05-25: 1000 ug via INTRAMUSCULAR

## 2014-05-25 MED ORDER — CYANOCOBALAMIN 1000 MCG/ML IJ SOLN
INTRAMUSCULAR | Status: AC
  Filled 2014-05-25: qty 1

## 2014-05-25 NOTE — Progress Notes (Signed)
Checked in new patient with [employer]. She has appt card and will call me if any asst is needed.

## 2014-05-25 NOTE — Progress Notes (Signed)
[facility] [phone]   [phone]  OFFICE PROGRESS NOTE  No PCP Per Patient No address on file  DIAGNOSIS: metastatic non-small cell lung cancer, adenocarcinoma. This was initially diagnosed as stage IB (T2a, N0, M0) In March of 2014, status post right upper lobectomy with lymph node dissection but the patient has evidence for disease recurrence in the right axilla status post resection.  PRIOR THERAPY: Status post right axillary lymph node biopsy on 05/01/2014 under the care of Dr. Roxan Hockey  CURRENT THERAPY: Systemic chemotherapy with carboplatin for AUC of 5 and Alimta 500 mg/M2 every 3 weeks. First cycle 05/31/2014.  INTERVAL HISTORY: Nancy Hodges 68 y.o. female returns to the clinic today for followup visit accompanied by a friend. The patient is feeling fine today with no specific complaints. She recently underwent several studies for restaging of her disease including CT scan of the head as well as PET scan. She is not today for evaluation and discussion of her treatment options. She denied having any significant chest pain, shortness breath, cough or hemoptysis. She has no weight loss or night sweats. She denied having any significant nausea or vomiting, no fever or chills.   MEDICAL HISTORY: Past Medical History  Diagnosis Date  . Asthma   . History of sinusitis   . Allergic rhinitis   . History of kidney stones   . 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 H/O hiatal hernia   . GERD (gastroesophageal reflux disease)   . Pulmonary nodule   . Paroxysmal atrial fibrillation     CHADS score: 0  . Atrial flutter   . Tachycardia-bradycardia syndrome     a. 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  [name] Anxiety   . Headache(784.0)     sinus  . Lung cancer     a. Stage IB non-small cell carcinoma, s/p R VATS, wedge resection, RU lobectomy 10/2012.  . Diverticulosis   . Internal hemorrhoid   . Dysrhythmia   .  Pneumonia June 2014  . Arthritis   . Thin skin   . Bruises easily     patient on Xarelto    ALLERGIES:  is allergic to prednisone; septra; penicillins; and vioxx.  MEDICATIONS:  Current Outpatient Prescriptions  Medication Sig Dispense Refill  . acetaminophen (TYLENOL) 500 MG tablet Take 500 mg by mouth every 6 (six) hours as needed for pain.      [name] albuterol (PROVENTIL) (2.5 MG/3ML) 0.083% nebulizer solution Take 3 mLs (2.5 mg total) by nebulization every 2 (two) hours as needed for shortness of breath.  120 mL  0  . albuterol-ipratropium (COMBIVENT) 18-103 MCG/ACT inhaler Inhale 2 puffs into the lungs every 6 (six) hours as needed for wheezing.      [name] amiodarone (PACERONE) 100 MG tablet Take 1 tablet (100 mg total) by mouth daily.  30 tablet  11  . diltiazem (CARDIZEM CD) 240 MG 24 hr capsule Take 1 capsule (240 mg total) by mouth daily.  30 capsule  10  . fexofenadine (ALLEGRA) 180 MG tablet Take 180 mg by mouth daily.      . fluticasone (FLONASE) 50 MCG/ACT nasal spray Place 2 sprays into the nose daily as needed (for congestion).      [name] guaiFENesin (MUCINEX) 600 MG 12 hr tablet Take 600 mg by mouth daily as needed for cough or to loosen phlegm.      [name] guaiFENesin-dextromethorphan (ROBITUSSIN DM) 100-10 MG/5ML syrup Take 5 mLs  by mouth 3 (three) times daily as needed for cough.  118 mL  0  . rivaroxaban (XARELTO) 20 MG TABS tablet Take 1 tablet (20 mg total) by mouth daily.  30 tablet  11  . traMADol (ULTRAM) 50 MG tablet Take 1-2 tablets (50-100 mg total) by mouth every 6 (six) hours as needed (pain).  30 tablet  0   No current facility-administered medications for this visit.    SURGICAL HISTORY:  Past Surgical History  Procedure Laterality Date  . Partial hysterectomy    . Pacemaker insertion  09/14/2012    Nanostim (SJM) leadless pacemaker (LEADLESS II STUDY PATIENT) implanted by Dr [name]  . Video assisted thoracoscopy (vats)/wedge resection Right 10/27/2012    Procedure:  VIDEO ASSISTED THORACOSCOPY (VATS),RGHT UPPER LOBE LUNG WEDGE RESECTION;  Surgeon: [name], MD;  Location: [facility];  Service: Thoracic;  Laterality: Right;  . Lobectomy Right 10/27/2012    Procedure: LOBECTOMY;  Surgeon: [name], MD;  Location: [facility];  Service: Thoracic;  Laterality: Right;   RIGHT UPPER LOBECTOMY & Node Dissection  . Abdominal hysterectomy    . Insert / replace / remove pacemaker      [employer]  . Colonoscopy w/ polypectomy  2015  . Lymph node biopsy Right 05/01/2014    Procedure: LYMPH NODE BIOPSY, Right Axillary;  Surgeon: [name], MD;  Location: [facility];  Service: Thoracic;  Laterality: Right;    REVIEW OF SYSTEMS:  Constitutional: negative Eyes: negative Ears, nose, mouth, throat, and face: negative Respiratory: negative Cardiovascular: negative Gastrointestinal: negative Genitourinary:negative Integument/breast: negative Hematologic/lymphatic: negative Musculoskeletal:negative Neurological: negative Behavioral/Psych: negative Endocrine: negative Allergic/Immunologic: negative   PHYSICAL EXAMINATION: General appearance: alert, cooperative and no distress Head: Normocephalic, without obvious abnormality, atraumatic Neck: no adenopathy, no JVD, supple, symmetrical, trachea midline and thyroid not enlarged, symmetric, no tenderness/mass/nodules Lymph nodes: Cervical, supraclavicular, and axillary nodes normal. Resp: clear to auscultation bilaterally Back: symmetric, no curvature. ROM normal. No CVA tenderness. Cardio: regular rate and rhythm, S1, S2 normal, no murmur, click, rub or gallop GI: soft, non-tender; bowel sounds normal; no masses,  no organomegaly Extremities: extremities normal, atraumatic, no cyanosis or edema Neurologic: Alert and oriented X 3, normal strength and tone. Normal symmetric reflexes. Normal coordination and gait  ECOG PERFORMANCE STATUS: 1 - Symptomatic but completely ambulatory  Blood pressure  149/46, pulse 72, temperature 98.3 F (36.8 C), temperature source Oral, resp. rate 18, height 5' 2"  (1.575 m), weight 146 lb 1.6 oz (66.271 kg), SpO2 99.00%.  LABORATORY DATA: Lab Results  Component Value Date   WBC 5.9 05/11/2014   HGB 10.2* 05/11/2014   HCT 32.5* 05/11/2014   MCV 85.7 05/11/2014   PLT 265 05/11/2014      Chemistry      Component Value Date/Time   NA 141 05/11/2014 1222   NA 138 04/27/2014 1031   K 4.4 05/11/2014 1222   K 4.2 04/27/2014 1031   CL 103 04/27/2014 1031   CO2 29 05/11/2014 1222   CO2 25 04/27/2014 1031   BUN 13.3 05/11/2014 1222   BUN 12 04/27/2014 1031   CREATININE 0.7 05/11/2014 1222   CREATININE 0.60 04/27/2014 1031      Component Value Date/Time   CALCIUM 9.7 05/11/2014 1222   CALCIUM 9.5 04/27/2014 1031   ALKPHOS 155* 05/11/2014 1222   ALKPHOS 173* 04/27/2014 1031   AST 37* 05/11/2014 1222   AST 34 04/27/2014 1031   ALT 26 05/11/2014 1222   ALT 22 04/27/2014  1031   BILITOT 1.00 05/11/2014 1222   BILITOT 0.9 04/27/2014 1031       RADIOGRAPHIC STUDIES: Chest 2 View  04/27/2014   CLINICAL DATA:  Cough.  Short of breath.  EXAM: CHEST  2 VIEW  COMPARISON:  Chest CT, 04/25/2014.  Chest radiograph, 12/28/2013.  FINDINGS: Postsurgical changes on the right are noted with a pulmonary anastomosis staple line and vascular clips superimposed over the right hilum. There is volume loss on the right. Minor scarring is noted in the right lung base and medial right upper lobe along the pulmonary anastomosis staple line. This is stable.  Left lung is hyperexpanded. No lung consolidation or edema. No pulmonary mass or nodule.  No pleural effusion or pneumothorax.  Cardiac silhouette is normal in size. No mediastinal or hilar masses or evidence of adenopathy.  Bony thorax is demineralized but intact.  IMPRESSION: 1. No acute cardiopulmonary disease. No change from the prior chest radiograph. 2. Changes from right lung surgery.   Electronically Signed   By: Lajean Manes M.D.   On:  04/27/2014 10:47   Ct Head W Wo Contrast  05/17/2014   CLINICAL DATA:  Lung cancer recurrence.  Restaging.  EXAM: CT HEAD WITHOUT AND WITH CONTRAST  TECHNIQUE: Contiguous axial images were obtained from the base of the skull through the vertex without and with intravenous contrast  CONTRAST:  185m OMNIPAQUE IOHEXOL 300 MG/ML  SOLN  COMPARISON:  None.  FINDINGS: No evidence for acute infarction, hemorrhage, mass lesion, hydrocephalus, or extra-axial fluid. Mild atrophy. Hypoattenuation white matter representing chronic microvascular ischemic change without evidence for vasogenic edema. Old RIGHT cerebellar infarct. Post infusion, no abnormal enhancement. No calvarial lesions. Extracranial soft tissues unremarkable.  IMPRESSION: No evidence for intracranial metastatic disease. Chronic changes as described.   Electronically Signed   By: JRolla FlattenM.D.   On: 05/17/2014 10:01   Nm Pet Image Restag (ps) Skull Base To Thigh  05/17/2014   CLINICAL DATA:  Subsequent treatment strategy for lung cancer. Right lung cancer status post right upper lobe wedge resection and lobectomy. Mildly differentiated adenocarcinoma. Recent biopsy of enlarged right axial lymph node demonstrated metastatic adenocarcinoma.  EXAM: NUCLEAR MEDICINE PET SKULL BASE TO THIGH  TECHNIQUE: 7.2 mCi F-18 FDG was injected intravenously. Full-ring PET imaging was performed from the skull base to thigh after the radiotracer. CT data was obtained and used for attenuation correction and anatomic localization.  FASTING BLOOD GLUCOSE:  Value: 129 mg/dl  COMPARISON:  CT 12/23/2013, PET-CT scan 10/12/2012  FINDINGS: NECK  No hypermetabolic lymph nodes in the neck.  CHEST  There is moderate metabolic activity in the right axilla at the surgical site. There is a small mildly hypermetabolic left axillary lymph node measuring 8 mm (image 50, series 4). No hypermetabolic supraclavicular nodes. No hypermetabolic mediastinal lymph nodes. No suspicious  pulmonary nodules. Postsurgical change in the right hemi thorax.  ABDOMEN/PELVIS  There is increased metabolic activity associated with periportal/ peripancreatic lymph nodes. These lymph nodes are difficult to define on the non contrast CT. Lymph node measuring 12 mm short axis (image 109) on image with SUV max 5.4. A second lymph node positioned inferior to the pancreatic head and second portion duodenum measuring 13 mm (image 109) with SUV max equal 5.0.  No abnormal metabolic activity within the liver. No hypermetabolic retroperitoneal nodes. No hypermetabolic pelvic nodes  SKELETON  No focal hypermetabolic activity to suggest skeletal metastasis.  IMPRESSION: 1. Hypermetabolic periportal lymph nodes are indeterminate but concerning in  light of the metastatic right axial lymph node. 2. Small hypermetabolic left axillary lymph node is indeterminate. 3. No additional evidence of metastatic lung cancer. 4. Postoperative change in the right hemi thorax without evidence of local recurrence.   Electronically Signed   By: Suzy Bouchard M.D.   On: 05/17/2014 13:36    ASSESSMENT AND PLAN: This is a very pleasant 68 years old white female recently diagnosed with metastatic non-small cell lung cancer, adenocarcinoma with negative EGFR mutation and negative ALK gene translocation. This was initially diagnosed as stage IB status post right upper lobectomy but the patient had evidence for disease recurrence in the right axilla status post resection. The recent PET scan showed no other evidence for metastatic disease but there is increased metabolic activity associated with periportal/ peripancreatic lymph nodes. This will need close monitoring on the upcoming scans. I have a lengthy discussion with the patient and her friend today about her treatment options. I recommended for her systemic chemotherapy with carboplatin for AUC of 5 and Alimta 500 mg/M2 every 3 weeks for a total of 4-6 cycles. I discussed with the  patient adverse effect of this treatment including but not limited to alopecia, myelosuppression, nausea and vomiting, peripheral neuropathy, liver or renal dysfunction. She is expected to start the first cycle of this treatment next week. I will arrange for the patient to receive vitamin B12 injection today. I will also call her pharmacy with prescription for Compazine 10 mg by mouth every 6 hours as needed for nausea, folic acid 1 mg by mouth daily in addition to Decadron 4 mg by mouth twice a day the day before, day of and day after the chemotherapy every 3 weeks. The patient will have a chemotherapy education class before starting the first dose of her treatment. The patient was also seen today by Dr. Tammi Klippel for evaluation and discussion of palliative radiotherapy to the right axillary area because of the extracapsular features of the resected lymph node. This can be done after completion of the chemotherapy. During the multidisciplinary thoracic oncology clinic today, the patient was seen by medical oncology, radiation oncology, thoracic Navigator, social worker and physical therapist. She would come back for followup visit in 2 weeks for reevaluation and management of any adverse effect of her treatment. She was advised to call immediately if she has any concerning symptoms in the interval. The patient voices understanding of current disease status and treatment options and is in agreement with the current care plan.  All questions were answered. The patient knows to call the clinic with any problems, questions or concerns. We can certainly see the patient much sooner if necessary.  I spent 20 minutes counseling the patient face to face. The total time spent in the appointment was 30 minutes.  Disclaimer: This note was dictated with voice recognition software. Similar sounding words can inadvertently be transcribed and may not be corrected upon review.

## 2014-05-25 NOTE — Telephone Encounter (Signed)
gv adn printed appt sched and avs for pt for OCT and NVO....sed added tx.

## 2014-05-25 NOTE — Progress Notes (Signed)
Radiation Oncology         [phone] ________________________________   Multidisciplinary Thoracic Oncology Clinic [facility]) Initial Outpatient Consultation  Name: Nancy Hodges MRN: [id number]  Date: 05/25/2014  DOB: 1946-03-19  CC:No PCP Per Patient  Curt Bears, MD   REFERRING PHYSICIAN: Curt Bears, MD  DIAGNOSIS: The encounter diagnosis was Non-small cell carcinoma of right lung, stage 4.    ICD-9-CM ICD-10-CM  1. Non-small cell carcinoma of right lung, stage 4 162.9 C34.91    HISTORY OF PRESENT ILLNESS::Nancy Hodges is a 68 y.o. female who is with a history of a thoracoscopic right upper lobectomy for a stage IB non-small cell carcinoma in March of 2014. The mass was discovered incidentally on a chest x-ray prior to a pacemaker implant. She had not been feeling well lately.   Follow-up chest CT on 9/22 showed a new markedly enlarged right axillary lymph node. This measured 2.5 x 1.7 cm.  She underwent right axillary lymph node excisional biopsy.  Pathology revealed metastatic adenocarcinoma with diffuse extracapsular extension.  The patient underwent restaging studies including brain MRI and PET scan. The PET scan showed some low-level postoperative activity in the axilla as well as a metastatic lymph node in the portal region of the upper abdomen.  PREVIOUS RADIATION THERAPY: No  PAST MEDICAL HISTORY:  has a past medical history of Asthma; History of sinusitis; Allergic rhinitis; History of kidney stones; Pacemaker (09/14/2012); H/O hiatal hernia; GERD (gastroesophageal reflux disease); Pulmonary nodule; Paroxysmal atrial fibrillation; Atrial flutter; Tachycardia-bradycardia syndrome; Anxiety; Headache(784.0); Lung cancer; Diverticulosis; Internal hemorrhoid; Dysrhythmia; Pneumonia (June 2014); Arthritis; Thin skin; and Bruises easily.    PAST SURGICAL HISTORY: Past Surgical History  Procedure Laterality Date  . Partial hysterectomy    . Pacemaker  insertion  09/14/2012    Nanostim (SJM) leadless pacemaker (LEADLESS II STUDY PATIENT) implanted by Dr [name]  . Video assisted thoracoscopy (vats)/wedge resection Right 10/27/2012    Procedure: VIDEO ASSISTED THORACOSCOPY (VATS),RGHT UPPER LOBE LUNG WEDGE RESECTION;  Surgeon: [name], MD;  Location: [facility];  Service: Thoracic;  Laterality: Right;  . Lobectomy Right 10/27/2012    Procedure: LOBECTOMY;  Surgeon: [name], MD;  Location: [place] [facility];  Service: Thoracic;  Laterality: Right;   RIGHT UPPER LOBECTOMY & Node Dissection  . Abdominal hysterectomy    . Insert / replace / remove pacemaker      St. Jude  . Colonoscopy w/ polypectomy  2015  . Lymph node biopsy Right 05/01/2014    Procedure: LYMPH NODE BIOPSY, Right Axillary;  Surgeon: [name], MD;  Location: [facility] OR;  Service: Thoracic;  Laterality: Right;    FAMILY HISTORY: family history includes Asthma in her father; Cancer in her brother, mother, and sister; Diabetes in her brother, mother, and sister; Heart attack in her son; Heart disease in her father and mother.  SOCIAL HISTORY:  reports that she quit smoking about 18 years ago. Her smoking use included Cigarettes. She has a 80 pack-year smoking history. She has never used smokeless tobacco. She reports that she does not drink alcohol or use illicit drugs.  ALLERGIES: Prednisone; Septra; Penicillins; and Vioxx  MEDICATIONS:  Current Outpatient Prescriptions  Medication Sig Dispense Refill  . acetaminophen (TYLENOL) 500 MG tablet Take 500 mg by mouth every 6 (six) hours as needed for pain.      [name] albuterol (PROVENTIL) (2.5 MG/3ML) 0.083% nebulizer solution Take 3 mLs (2.5 mg total) by nebulization every 2 (two) hours as needed for shortness  of breath.  120 mL  0  . albuterol-ipratropium (COMBIVENT) 18-103 MCG/ACT inhaler Inhale 2 puffs into the lungs every 6 (six) hours as needed for wheezing.      [name] amiodarone (PACERONE) 100 MG tablet Take 1 tablet (100  mg total) by mouth daily.  30 tablet  11  . diltiazem (CARDIZEM CD) 240 MG 24 hr capsule Take 1 capsule (240 mg total) by mouth daily.  30 capsule  10  . fexofenadine (ALLEGRA) 180 MG tablet Take 180 mg by mouth daily.      . fluticasone (FLONASE) 50 MCG/ACT nasal spray Place 2 sprays into the nose daily as needed (for congestion).      [name] guaiFENesin (MUCINEX) 600 MG 12 hr tablet Take 600 mg by mouth daily as needed for cough or to loosen phlegm.      [name] guaiFENesin-dextromethorphan (ROBITUSSIN DM) 100-10 MG/5ML syrup Take 5 mLs by mouth 3 (three) times daily as needed for cough.  118 mL  0  . rivaroxaban (XARELTO) 20 MG TABS tablet Take 1 tablet (20 mg total) by mouth daily.  30 tablet  11  . traMADol (ULTRAM) 50 MG tablet Take 1-2 tablets (50-100 mg total) by mouth every 6 (six) hours as needed (pain).  30 tablet  0   No current facility-administered medications for this encounter.    REVIEW OF SYSTEMS:  A 15 point review of systems is documented in the electronic medical record. This was obtained by the nursing staff. However, I reviewed this with the patient to discuss relevant findings and make appropriate changes.  A comprehensive review of systems was negative. the patient had experienced some fatigue prior to her diagnosis with axillary recurrence, but was found to be anemic and attributes the fatigue to that. This has improved.   PHYSICAL EXAM:  height is 5\' 2"  (1.575 m) and weight is 146 lb (66.225 kg). Her temperature is 98.1 F (36.7 C). Her blood pressure is 134/54 and her pulse is 75. Her respiration is 17 and oxygen saturation is 98%.  further detailed physical exams deferred today.   KPS = 90  100 - Normal; no complaints; no evidence of disease. 90   - Able to carry on normal activity; minor signs or symptoms of disease. 80   - Normal activity with effort; some signs or symptoms of disease. 33   - Cares for self; unable to carry on normal activity or to do active work. 60   -  Requires occasional assistance, but is able to care for most of his personal needs. 50   - Requires considerable assistance and frequent medical care. 25   - Disabled; requires special care and assistance. 59   - Severely disabled; hospital admission is indicated although death not imminent. 55   - Very sick; hospital admission necessary; active supportive treatment necessary. 10   - Moribund; fatal processes progressing rapidly. 0     - Dead  Karnofsky DA, Abelmann [place], Craver LS and Burchenal [facility] [phone]) The use of the nitrogen mustards in the palliative treatment of carcinoma: with particular reference to bronchogenic carcinoma Cancer 1 634-56  LABORATORY DATA:  Lab Results  Component Value Date   WBC 5.9 05/11/2014   HGB 10.2* 05/11/2014   HCT 32.5* 05/11/2014   MCV 85.7 05/11/2014   PLT 265 05/11/2014   Lab Results  Component Value Date   NA 141 05/11/2014   K 4.4 05/11/2014   CL 103 04/27/2014   CO2 29 05/11/2014  Lab Results  Component Value Date   ALT 26 05/11/2014   AST 37* 05/11/2014   ALKPHOS 155* 05/11/2014   BILITOT 1.00 05/11/2014    PULMONARY FUNCTION TEST:  Not applicable   RADIOGRAPHY: Chest 2 View  04/27/2014   CLINICAL DATA:  Cough.  Short of breath.  EXAM: CHEST  2 VIEW  COMPARISON:  Chest CT, 04/25/2014.  Chest radiograph, 12/28/2013.  FINDINGS: Postsurgical changes on the right are noted with a pulmonary anastomosis staple line and vascular clips superimposed over the right hilum. There is volume loss on the right. Minor scarring is noted in the right lung base and medial right upper lobe along the pulmonary anastomosis staple line. This is stable.  Left lung is hyperexpanded. No lung consolidation or edema. No pulmonary mass or nodule.  No pleural effusion or pneumothorax.  Cardiac silhouette is normal in size. No mediastinal or hilar masses or evidence of adenopathy.  Bony thorax is demineralized but intact.  IMPRESSION: 1. No acute cardiopulmonary disease. No change  from the prior chest radiograph. 2. Changes from right lung surgery.   Electronically Signed   By: Lajean Manes M.D.   On: 04/27/2014 10:47   Ct Head W Wo Contrast  05/17/2014   CLINICAL DATA:  Lung cancer recurrence.  Restaging.  EXAM: CT HEAD WITHOUT AND WITH CONTRAST  TECHNIQUE: Contiguous axial images were obtained from the base of the skull through the vertex without and with intravenous contrast  CONTRAST:  172mL OMNIPAQUE IOHEXOL 300 MG/ML  SOLN  COMPARISON:  None.  FINDINGS: No evidence for acute infarction, hemorrhage, mass lesion, hydrocephalus, or extra-axial fluid. Mild atrophy. Hypoattenuation white matter representing chronic microvascular ischemic change without evidence for vasogenic edema. Old RIGHT cerebellar infarct. Post infusion, no abnormal enhancement. No calvarial lesions. Extracranial soft tissues unremarkable.  IMPRESSION: No evidence for intracranial metastatic disease. Chronic changes as described.   Electronically Signed   By: Rolla Flatten M.D.   On: 05/17/2014 10:01   Ct Chest Wo Contrast  04/25/2014   CLINICAL DATA:  History of right sided lung cancer, status post right upper lobectomy.  EXAM: CT CHEST WITHOUT CONTRAST  TECHNIQUE: Multidetector CT imaging of the chest was performed following the standard protocol without IV contrast.  COMPARISON:  Chest CT 10/25/2013.  FINDINGS: Mediastinum: Heart size is mildly enlarged. Electronic device in the right ventricular apex appears to represent an implantable pacemaker. There is no significant pericardial fluid, thickening or pericardial calcification. No pathologically enlarged mediastinal or hilar lymph nodes. Please note that accurate exclusion of hilar adenopathy is limited on noncontrast CT scans. Small hiatal hernia. There is atherosclerosis of the thoracic aorta, the great vessels of the mediastinum and the coronary arteries, including calcified atherosclerotic plaque in the left anterior descending and left circumflex  coronary arteries.  Lungs/Pleura: Postoperative changes of right upper lobectomy. There is some mild postoperative scarring and architectural distortion in the periphery of the right lower and middle lobes. Narrowing of the distal aspect of the bronchus intermedius and proximal aspects of the right middle and lower lobe bronchi is noted, similar to the prior examination. No large areas of atelectasis are identified at this time, and no overt postobstructive changes are noted in the right lung. There is a background of mild centrilobular emphysema. No suspicious appearing pulmonary nodules or masses are noted.  Upper Abdomen: The spleen is incompletely visualized, but is clearly enlarged measuring at least 15.5 x 9.9 cm on axial images. Small calcified granuloma in the liver.  Musculoskeletal: There are no aggressive appearing lytic or blastic lesions noted in the visualized portions of the skeleton. Enlarged right axillary lymph node, new compared to the prior study, measuring 2.5 x 1.7 cm.  IMPRESSION: 1. Status post right upper lobectomy with stable postoperative changes in the right hemithorax. No findings to suggest local recurrence of disease or metastatic disease in the chest. 2. However, there is a new markedly enlarged right axillary lymph node. This measures 2.5 x 1.7 cm. Correlation with mammography is strongly recommended in the near future to exclude an underlying primary breast lesion. 3. Splenomegaly. 4. Mild centrilobular emphysema. 5. Atherosclerosis, including 2 vessel coronary artery disease. Assessment for potential risk factor modification, dietary therapy or pharmacologic therapy may be warranted, if clinically indicated. 6. Additional incidental findings, as above. These results will be called to the ordering clinician or representative by the Radiologist Assistant, and communication documented in the PACS or zVision Dashboard.   Electronically Signed   By: Vinnie Langton M.D.   On:  04/25/2014 11:46   Nm Pet Image Restag (ps) Skull Base To Thigh  05/17/2014   CLINICAL DATA:  Subsequent treatment strategy for lung cancer. Right lung cancer status post right upper lobe wedge resection and lobectomy. Mildly differentiated adenocarcinoma. Recent biopsy of enlarged right axial lymph node demonstrated metastatic adenocarcinoma.  EXAM: NUCLEAR MEDICINE PET SKULL BASE TO THIGH  TECHNIQUE: 7.2 mCi F-18 FDG was injected intravenously. Full-ring PET imaging was performed from the skull base to thigh after the radiotracer. CT data was obtained and used for attenuation correction and anatomic localization.  FASTING BLOOD GLUCOSE:  Value: 129 mg/dl  COMPARISON:  CT 12/23/2013, PET-CT scan 10/12/2012  FINDINGS: NECK  No hypermetabolic lymph nodes in the neck.  CHEST  There is moderate metabolic activity in the right axilla at the surgical site. There is a small mildly hypermetabolic left axillary lymph node measuring 8 mm (image 50, series 4). No hypermetabolic supraclavicular nodes. No hypermetabolic mediastinal lymph nodes. No suspicious pulmonary nodules. Postsurgical change in the right hemi thorax.  ABDOMEN/PELVIS  There is increased metabolic activity associated with periportal/ peripancreatic lymph nodes. These lymph nodes are difficult to define on the non contrast CT. Lymph node measuring 12 mm short axis (image 109) on image with SUV max 5.4. A second lymph node positioned inferior to the pancreatic head and second portion duodenum measuring 13 mm (image 109) with SUV max equal 5.0.  No abnormal metabolic activity within the liver. No hypermetabolic retroperitoneal nodes. No hypermetabolic pelvic nodes  SKELETON  No focal hypermetabolic activity to suggest skeletal metastasis.  IMPRESSION: 1. Hypermetabolic periportal lymph nodes are indeterminate but concerning in light of the metastatic right axial lymph node. 2. Small hypermetabolic left axillary lymph node is indeterminate. 3. No additional  evidence of metastatic lung cancer. 4. Postoperative change in the right hemi thorax without evidence of local recurrence.   Electronically Signed   By: Suzy Bouchard M.D.   On: 05/17/2014 13:36      IMPRESSION: Patient is a 68 year old woman status post right axillary lymph node excision for oligo metastatic recurrence of adenocarcinoma lung. The extracapsular extension of the lymph nodes in her axilla leaves her at risk for local recurrence. Addition, patient has metastatic adenopathy in the portal region of the upper abdomen. Clearly, this patient will benefit from systemic chemotherapy. However, given the presence of only 2 metastatic deposits, it may be worthwhile to consider consolidative radiotherapy to prevent recurrence and progression in the axilla and region  respectively.  PLAN:Today, I talked to the patient and family about the findings and work-up thus far.  We discussed the natural history of oligo metastatic disease and general treatment, highlighting the role of radiotherapy in the management.  We discussed the available radiation techniques, and focused on the details of logistics and delivery.  We reviewed the anticipated acute and late sequelae associated with radiation in this setting.  The patient was encouraged to ask questions that I answered to the best of my ability.   The patient would like to proceed with chemotherapy and potentially returned to discuss localized radiotherapy in the future.   I spent 20 minutes minutes face to face with the patient and more than 50% of that time was spent in counseling and/or coordination of care.    ------------------------------------------------  Sheral Apley. Tammi Klippel, M.D.

## 2014-05-26 ENCOUNTER — Encounter: Payer: Self-pay | Admitting: *Deleted

## 2014-05-26 MED ORDER — FOLIC ACID 1 MG PO TABS: 1.0000 mg | ORAL_TABLET | Freq: Every day | ORAL | Status: DC

## 2014-05-26 MED ORDER — PROCHLORPERAZINE MALEATE 10 MG PO TABS: 10.0000 mg | ORAL_TABLET | Freq: Four times a day (QID) | ORAL | Status: DC | PRN

## 2014-05-26 MED ORDER — DEXAMETHASONE 4 MG PO TABS: ORAL_TABLET | ORAL | Status: DC

## 2014-05-26 NOTE — [facility] Oncology Navigator Note (Unsigned)
Patient called me today.  She stated that her medications are not at the pharmacy.  I stated I will speak with Dr. Julien Nordmann to order and sent to her pharmacy.  We also spoke about her experience yesterday at the cancer center.  She stated everyone was very nice.  I listened as she spoke highly of the cancer center and the staff.  I asked patient if she would call our social worker due to her not seeing her yesterday.  She stated she already spoke with her today.

## 2014-05-26 NOTE — Addendum Note (Signed)
Addended by: Curt Bears on: 05/26/2014 04:55 PM   Modules accepted: Orders

## 2014-05-29 ENCOUNTER — Encounter (HOSPITAL_COMMUNITY): Payer: Self-pay

## 2014-05-31 ENCOUNTER — Encounter: Payer: Self-pay | Admitting: *Deleted

## 2014-05-31 ENCOUNTER — Ambulatory Visit (HOSPITAL_BASED_OUTPATIENT_CLINIC_OR_DEPARTMENT_OTHER): Payer: Medicare Other

## 2014-05-31 ENCOUNTER — Other Ambulatory Visit (HOSPITAL_BASED_OUTPATIENT_CLINIC_OR_DEPARTMENT_OTHER): Payer: Medicare Other

## 2014-05-31 ENCOUNTER — Other Ambulatory Visit: Payer: Medicare Other

## 2014-05-31 VITALS — BP 141/43 | HR 87 | Temp 98.3°F | Resp 20

## 2014-05-31 DIAGNOSIS — C773 Secondary and unspecified malignant neoplasm of axilla and upper limb lymph nodes: Secondary | ICD-10-CM

## 2014-05-31 DIAGNOSIS — C3411 Malignant neoplasm of upper lobe, right bronchus or lung: Secondary | ICD-10-CM

## 2014-05-31 DIAGNOSIS — Z5111 Encounter for antineoplastic chemotherapy: Secondary | ICD-10-CM

## 2014-05-31 DIAGNOSIS — C3491 Malignant neoplasm of unspecified part of right bronchus or lung: Secondary | ICD-10-CM

## 2014-05-31 LAB — COMPREHENSIVE METABOLIC PANEL (CC13)
ALT: 28 U/L (ref 0–55)
AST: 25 U/L (ref 5–34)
Albumin: 3.5 g/dL (ref 3.5–5.0)
Alkaline Phosphatase: 146 U/L (ref 40–150)
Anion Gap: 6 mEq/L (ref 3–11)
BILIRUBIN TOTAL: 0.81 mg/dL (ref 0.20–1.20)
BUN: 11.8 mg/dL (ref 7.0–26.0)
CO2: 25 meq/L (ref 22–29)
CREATININE: 0.8 mg/dL (ref 0.6–1.1)
Calcium: 9.9 mg/dL (ref 8.4–10.4)
Chloride: 104 mEq/L (ref 98–109)
Glucose: 325 mg/dl — ABNORMAL HIGH (ref 70–140)
Potassium: 4.6 mEq/L (ref 3.5–5.1)
Sodium: 134 mEq/L — ABNORMAL LOW (ref 136–145)
Total Protein: 7.6 g/dL (ref 6.4–8.3)

## 2014-05-31 LAB — CBC WITH DIFFERENTIAL/PLATELET
BASO%: 0 % (ref 0.0–2.0)
BASOS ABS: 0 10*3/uL (ref 0.0–0.1)
EOS%: 0 % (ref 0.0–7.0)
Eosinophils Absolute: 0 10*3/uL (ref 0.0–0.5)
HEMATOCRIT: 29.6 % — AB (ref 34.8–46.6)
HEMOGLOBIN: 9.7 g/dL — AB (ref 11.6–15.9)
LYMPH#: 0.9 10*3/uL (ref 0.9–3.3)
LYMPH%: 6.6 % — AB (ref 14.0–49.7)
MCH: 27.8 pg (ref 25.1–34.0)
MCHC: 32.8 g/dL (ref 31.5–36.0)
MCV: 84.8 fL (ref 79.5–101.0)
MONO#: 0.4 10*3/uL (ref 0.1–0.9)
MONO%: 3.2 % (ref 0.0–14.0)
NEUT#: 11.7 10*3/uL — ABNORMAL HIGH (ref 1.5–6.5)
NEUT%: 90.2 % — ABNORMAL HIGH (ref 38.4–76.8)
Platelets: 244 10*3/uL (ref 145–400)
RBC: 3.49 10*6/uL — ABNORMAL LOW (ref 3.70–5.45)
RDW: 16.2 % — ABNORMAL HIGH (ref 11.2–14.5)
WBC: 13 10*3/uL — ABNORMAL HIGH (ref 3.9–10.3)

## 2014-05-31 MED ORDER — SODIUM CHLORIDE 0.9 % IV SOLN
500.0000 mg/m2 | Freq: Once | INTRAVENOUS | Status: AC
  Administered 2014-05-31: 850 mg via INTRAVENOUS
  Filled 2014-05-31: qty 34

## 2014-05-31 MED ORDER — SODIUM CHLORIDE 0.9 % IV SOLN
Freq: Once | INTRAVENOUS | Status: AC
  Administered 2014-05-31: 14:00:00 via INTRAVENOUS

## 2014-05-31 MED ORDER — SODIUM CHLORIDE 0.9 % IV SOLN
400.0000 mg | Freq: Once | INTRAVENOUS | Status: AC
  Administered 2014-05-31: 400 mg via INTRAVENOUS
  Filled 2014-05-31: qty 40

## 2014-05-31 MED ORDER — SODIUM CHLORIDE 0.9 % IV SOLN: 399.0000 mg | Freq: Once | INTRAVENOUS | Status: DC

## 2014-05-31 MED ORDER — DEXAMETHASONE SODIUM PHOSPHATE 20 MG/5ML IJ SOLN
20.0000 mg | Freq: Once | INTRAMUSCULAR | Status: AC
  Administered 2014-05-31: 20 mg via INTRAVENOUS

## 2014-05-31 MED ORDER — ONDANSETRON 16 MG/50ML IVPB (CHCC)
INTRAVENOUS | Status: AC
  Filled 2014-05-31: qty 16

## 2014-05-31 MED ORDER — ONDANSETRON 16 MG/50ML IVPB (CHCC)
16.0000 mg | Freq: Once | INTRAVENOUS | Status: AC
  Administered 2014-05-31: 16 mg via INTRAVENOUS

## 2014-05-31 MED ORDER — DEXAMETHASONE SODIUM PHOSPHATE 20 MG/5ML IJ SOLN
INTRAMUSCULAR | Status: AC
  Filled 2014-05-31: qty 5

## 2014-05-31 NOTE — Patient Instructions (Signed)
[facility] Discharge Instructions for Patients Receiving Chemotherapy  Today you received the following chemotherapy agents :  Alimta,  Carboplatin.  To help prevent nausea and vomiting after your treatment, we encourage you to take your nausea medication as prescribed by your physician.  Take Dexamethasone as specifically instructed by Dr. Julien Nordmann.   Take  Compazine 10mg  by mouth every 6 hours as needed for nausea.  DO NOT  Drive  After  Taking  Compazine  As this  Med  Can  Cause  Drowsiness. DO Drink  Lots  Of  Fluids  As  Tolerated.   DO NOT  Eat  Greasy  Nor  Spicy  Foods.   If you develop nausea and vomiting that is not controlled by your nausea medication, call the clinic.   BELOW ARE SYMPTOMS THAT SHOULD BE REPORTED IMMEDIATELY:  *FEVER GREATER THAN 100.5 F  *CHILLS WITH OR WITHOUT FEVER  NAUSEA AND VOMITING THAT IS NOT CONTROLLED WITH YOUR NAUSEA MEDICATION  *UNUSUAL SHORTNESS OF BREATH  *UNUSUAL BRUISING OR BLEEDING  TENDERNESS IN MOUTH AND THROAT WITH OR WITHOUT PRESENCE OF ULCERS  *URINARY PROBLEMS  *BOWEL PROBLEMS  UNUSUAL RASH Items with * indicate a potential emergency and should be followed up as soon as possible.  Feel free to call the clinic you have any questions or concerns. The clinic phone number is [phone].

## 2014-06-01 ENCOUNTER — Encounter: Payer: Self-pay | Admitting: *Deleted

## 2014-06-01 ENCOUNTER — Telehealth: Payer: Self-pay | Admitting: *Deleted

## 2014-06-01 NOTE — Telephone Encounter (Signed)
Message copied by Patton Salles on Thu Jun 01, 2014 12:15 PM ------      Message from: Elray Buba LE      Created: Wed May 31, 2014  3:49 PM      Regarding: Chemo follow up       First time  Alimta, Carboplatin,  Dr. Vista Mink,  TB, RN. ------

## 2014-06-01 NOTE — Telephone Encounter (Signed)
Pt called back. States she is doing well, denies nausea, vomiting or diarrhea. States she has been having some sinus problems and slight headache. Discussed taking tylenol for headache- she knows this can mask a fever, but she denies any fever at present. Will call back if has any problems.

## 2014-06-01 NOTE — Telephone Encounter (Signed)
Message copied by Patton Salles on Thu Jun 01, 2014 12:09 PM ------      Message from: Elray Buba LE      Created: Wed May 31, 2014  3:49 PM      Regarding: Chemo follow up       First time  Alimta, Carboplatin,  Dr. Vista Mink,  TB, RN. ------

## 2014-06-01 NOTE — Telephone Encounter (Signed)
Left message to call [facility] Triage. Follow up on 11/28

## 2014-06-05 ENCOUNTER — Other Ambulatory Visit (HOSPITAL_COMMUNITY): Payer: Self-pay

## 2014-06-05 DIAGNOSIS — C3490 Malignant neoplasm of unspecified part of unspecified bronchus or lung: Secondary | ICD-10-CM

## 2014-06-05 DIAGNOSIS — C3491 Malignant neoplasm of unspecified part of right bronchus or lung: Secondary | ICD-10-CM

## 2014-06-08 ENCOUNTER — Telehealth: Payer: Self-pay | Admitting: Physician Assistant

## 2014-06-08 ENCOUNTER — Ambulatory Visit (HOSPITAL_BASED_OUTPATIENT_CLINIC_OR_DEPARTMENT_OTHER): Payer: Medicare Other | Admitting: Physician Assistant

## 2014-06-08 ENCOUNTER — Telehealth: Payer: Self-pay | Admitting: *Deleted

## 2014-06-08 ENCOUNTER — Encounter: Payer: Self-pay | Admitting: Physician Assistant

## 2014-06-08 ENCOUNTER — Other Ambulatory Visit (HOSPITAL_BASED_OUTPATIENT_CLINIC_OR_DEPARTMENT_OTHER): Payer: Medicare Other

## 2014-06-08 VITALS — BP 147/56 | HR 75 | Temp 98.8°F | Resp 18 | Ht 62.0 in | Wt 145.1 lb

## 2014-06-08 DIAGNOSIS — C3411 Malignant neoplasm of upper lobe, right bronchus or lung: Secondary | ICD-10-CM

## 2014-06-08 DIAGNOSIS — I4891 Unspecified atrial fibrillation: Secondary | ICD-10-CM

## 2014-06-08 DIAGNOSIS — C3491 Malignant neoplasm of unspecified part of right bronchus or lung: Secondary | ICD-10-CM

## 2014-06-08 LAB — COMPREHENSIVE METABOLIC PANEL (CC13)
ALBUMIN: 3.3 g/dL — AB (ref 3.5–5.0)
ALT: 45 U/L (ref 0–55)
ANION GAP: 6 meq/L (ref 3–11)
AST: 36 U/L — AB (ref 5–34)
Alkaline Phosphatase: 122 U/L (ref 40–150)
BUN: 10.3 mg/dL (ref 7.0–26.0)
CALCIUM: 9.5 mg/dL (ref 8.4–10.4)
CHLORIDE: 103 meq/L (ref 98–109)
CO2: 28 meq/L (ref 22–29)
Creatinine: 0.8 mg/dL (ref 0.6–1.1)
GLUCOSE: 135 mg/dL (ref 70–140)
POTASSIUM: 4 meq/L (ref 3.5–5.1)
Sodium: 137 mEq/L (ref 136–145)
Total Bilirubin: 0.67 mg/dL (ref 0.20–1.20)
Total Protein: 7 g/dL (ref 6.4–8.3)

## 2014-06-08 LAB — CBC WITH DIFFERENTIAL/PLATELET
BASO%: 0.2 % (ref 0.0–2.0)
BASOS ABS: 0 10*3/uL (ref 0.0–0.1)
EOS%: 2.1 % (ref 0.0–7.0)
Eosinophils Absolute: 0.1 10*3/uL (ref 0.0–0.5)
HEMATOCRIT: 31.4 % — AB (ref 34.8–46.6)
HEMOGLOBIN: 10.1 g/dL — AB (ref 11.6–15.9)
LYMPH#: 1.1 10*3/uL (ref 0.9–3.3)
LYMPH%: 33.7 % (ref 14.0–49.7)
MCH: 27.3 pg (ref 25.1–34.0)
MCHC: 32.2 g/dL (ref 31.5–36.0)
MCV: 84.8 fL (ref 79.5–101.0)
MONO#: 0.5 10*3/uL (ref 0.1–0.9)
MONO%: 15 % — ABNORMAL HIGH (ref 0.0–14.0)
NEUT#: 1.6 10*3/uL (ref 1.5–6.5)
NEUT%: 49 % (ref 38.4–76.8)
Platelets: 152 10*3/uL (ref 145–400)
RBC: 3.7 10*6/uL (ref 3.70–5.45)
RDW: 16.6 % — ABNORMAL HIGH (ref 11.2–14.5)
WBC: 3.4 10*3/uL — ABNORMAL LOW (ref 3.9–10.3)

## 2014-06-08 NOTE — Telephone Encounter (Signed)
Gave avs & cal for Nov/Dec. Sent mess to sch tx. Faxed [facility] date for next labs.

## 2014-06-08 NOTE — Progress Notes (Signed)
[place] [phone]   [phone]  OFFICE PROGRESS NOTE  No PCP Per Patient No address on file  DIAGNOSIS: metastatic non-small cell lung cancer, adenocarcinoma. This was initially diagnosed as stage IB (T2a, N0, M0) In March of 2014, status post right upper lobectomy with lymph node dissection but the patient has evidence for disease recurrence in the right axilla status post resection.  PRIOR THERAPY: Status post right axillary lymph node biopsy on 05/01/2014 under the care of Dr. Roxan Hockey  CURRENT THERAPY: Systemic chemotherapy with carboplatin for AUC of 5 and Alimta 500 mg/M2 every 3 weeks. First cycle 05/31/2014. Status post 1 cycle.  INTERVAL HISTORY: Nancy Hodges 68 y.o. female returns to the clinic today for followup visit accompanied by a friend. She presents for symptom management visit after receiving her first cycle of chemotherapy with carboplatin and Alimta. Overall she tolerated her first cycle of chemotherapy relatively well. She did have some mild nausea and fatigue however the symptoms have resolved. She notes improvement in her shortness of breath. She weighs no other specific complaints today. She denied any significant chest pain, cough or hemoptysis. She denied weight loss or night sweats. She denied any fever or chills.  MEDICAL HISTORY: Past Medical History  Diagnosis Date  . Asthma   . History of sinusitis   . Allergic rhinitis   . History of kidney stones   . 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 H/O hiatal hernia   . GERD (gastroesophageal reflux disease)   . Pulmonary nodule   . Paroxysmal atrial fibrillation     CHADS score: 0  . Atrial flutter   . Tachycardia-bradycardia syndrome     a. 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  [name] Anxiety   . Headache(784.0)     sinus  . Lung cancer     a. Stage IB non-small cell carcinoma, s/p R VATS, wedge resection, RU lobectomy 10/2012.  .  Diverticulosis   . Internal hemorrhoid   . Dysrhythmia   . Pneumonia June 2014  . Arthritis   . Thin skin   . Bruises easily     patient on Xarelto    ALLERGIES:  is allergic to prednisone; septra; penicillins; and vioxx.  MEDICATIONS:  Current Outpatient Prescriptions  Medication Sig Dispense Refill  . acetaminophen (TYLENOL) 500 MG tablet Take 500 mg by mouth every 6 (six) hours as needed for pain.    [name] albuterol (PROVENTIL) (2.5 MG/3ML) 0.083% nebulizer solution Take 3 mLs (2.5 mg total) by nebulization every 2 (two) hours as needed for shortness of breath. 120 mL 0  . albuterol-ipratropium (COMBIVENT) 18-103 MCG/ACT inhaler Inhale 2 puffs into the lungs every 6 (six) hours as needed for wheezing.    [name] amiodarone (PACERONE) 100 MG tablet Take 1 tablet (100 mg total) by mouth daily. 30 tablet 11  . dexamethasone (DECADRON) 4 MG tablet 4 mg po bid the day before, day of and day after chemo 40 tablet 1  . diltiazem (CARDIZEM CD) 240 MG 24 hr capsule Take 1 capsule (240 mg total) by mouth daily. 30 capsule 10  . fexofenadine (ALLEGRA) 180 MG tablet Take 180 mg by mouth daily.    . fluticasone (FLONASE) 50 MCG/ACT nasal spray Place 2 sprays into the nose daily as needed (for congestion).    . folic acid (FOLVITE) 1 MG tablet Take 1 tablet (1 mg total) by mouth daily. 30 tablet 4  . rivaroxaban (XARELTO)  20 MG TABS tablet Take 1 tablet (20 mg total) by mouth daily. 30 tablet 11  . traMADol (ULTRAM) 50 MG tablet Take 1-2 tablets (50-100 mg total) by mouth every 6 (six) hours as needed (pain). 30 tablet 0  . guaiFENesin (MUCINEX) 600 MG 12 hr tablet Take 600 mg by mouth daily as needed for cough or to loosen phlegm.    [name] guaiFENesin-dextromethorphan (ROBITUSSIN DM) 100-10 MG/5ML syrup Take 5 mLs by mouth 3 (three) times daily as needed for cough. 118 mL 0  . prochlorperazine (COMPAZINE) 10 MG tablet Take 1 tablet (10 mg total) by mouth every 6 (six) hours as needed for nausea or vomiting. 30  tablet 0   No current facility-administered medications for this visit.    SURGICAL HISTORY:  Past Surgical History  Procedure Laterality Date  . Partial hysterectomy    . Pacemaker insertion  09/14/2012    Nanostim (SJM) leadless pacemaker (LEADLESS II STUDY PATIENT) implanted by Dr [name]  . Video assisted thoracoscopy (vats)/wedge resection Right 10/27/2012    Procedure: VIDEO ASSISTED THORACOSCOPY (VATS),RGHT UPPER LOBE LUNG WEDGE RESECTION;  Surgeon: [name], MD;  Location: [place];  Service: Thoracic;  Laterality: Right;  . Lobectomy Right 10/27/2012    Procedure: LOBECTOMY;  Surgeon: [name], MD;  Location: [facility];  Service: Thoracic;  Laterality: Right;   RIGHT UPPER LOBECTOMY & Node Dissection  . Abdominal hysterectomy    . Insert / replace / remove pacemaker      St. Jude  . Colonoscopy w/ polypectomy  2015  . Lymph node biopsy Right 05/01/2014    Procedure: LYMPH NODE BIOPSY, Right Axillary;  Surgeon: [name], MD;  Location: [place];  Service: Thoracic;  Laterality: Right;    REVIEW OF SYSTEMS:  Constitutional: positive for fatigue Eyes: negative Ears, nose, mouth, throat, and face: negative Respiratory: negative Cardiovascular: negative Gastrointestinal: positive for nausea Genitourinary:negative Integument/breast: negative Hematologic/lymphatic: negative Musculoskeletal:negative Neurological: negative Behavioral/Psych: negative Endocrine: negative Allergic/Immunologic: negative   PHYSICAL EXAMINATION: General appearance: alert, cooperative and no distress Head: Normocephalic, without obvious abnormality, atraumatic Neck: no adenopathy, no JVD, supple, symmetrical, trachea midline and thyroid not enlarged, symmetric, no tenderness/mass/nodules Lymph nodes: Cervical, supraclavicular, and axillary nodes normal. Resp: clear to auscultation bilaterally Back: symmetric, no curvature. ROM normal. No CVA tenderness. Cardio: regular rate  and rhythm, S1, S2 normal, no murmur, click, rub or gallop GI: soft, non-tender; bowel sounds normal; no masses,  no organomegaly Extremities: extremities normal, atraumatic, no cyanosis or edema Neurologic: Alert and oriented X 3, normal strength and tone. Normal symmetric reflexes. Normal coordination and gait  ECOG PERFORMANCE STATUS: 1 - Symptomatic but completely ambulatory  Blood pressure 147/56, pulse 75, temperature 98.8 F (37.1 C), temperature source Oral, resp. rate 18, height 5' 2"  (1.575 m), weight 145 lb 1.6 oz (65.817 kg).  LABORATORY DATA: Lab Results  Component Value Date   WBC 3.4* 06/08/2014   HGB 10.1* 06/08/2014   HCT 31.4* 06/08/2014   MCV 84.8 06/08/2014   PLT 152 06/08/2014      Chemistry      Component Value Date/Time   NA 137 06/08/2014 1100   NA 138 04/27/2014 1031   K 4.0 06/08/2014 1100   K 4.2 04/27/2014 1031   CL 103 04/27/2014 1031   CO2 28 06/08/2014 1100   CO2 25 04/27/2014 1031   BUN 10.3 06/08/2014 1100   BUN 12 04/27/2014 1031   CREATININE 0.8 06/08/2014 1100   CREATININE 0.60 04/27/2014  1031      Component Value Date/Time   CALCIUM 9.5 06/08/2014 1100   CALCIUM 9.5 04/27/2014 1031   ALKPHOS 122 06/08/2014 1100   ALKPHOS 173* 04/27/2014 1031   AST 36* 06/08/2014 1100   AST 34 04/27/2014 1031   ALT 45 06/08/2014 1100   ALT 22 04/27/2014 1031   BILITOT 0.67 06/08/2014 1100   BILITOT 0.9 04/27/2014 1031       RADIOGRAPHIC STUDIES:  ASSESSMENT AND PLAN: This is a very pleasant 68 years old white female recently diagnosed with metastatic non-small cell lung cancer, adenocarcinoma with negative EGFR mutation and negative ALK gene translocation. This was initially diagnosed as stage IB status post right upper lobectomy but the patient had evidence for disease recurrence in the right axilla status post resection. She is status post one cycle of systemic chemotherapy with carboplatin for an invasive 5 Alimta 500 mg/m. Overall she  tolerated first cycle relatively well. Patient was discussed with and also seen by Dr. Julien Nordmann. She will continue with weekly labs as scheduled. She'll follow-up in 2 weeks prior to the start of cycle #2. She was advised to call immediately if she has any concerning symptoms in the interval. The patient voices understanding of current disease status and treatment options and is in agreement with the current care plan.  All questions were answered. The patient knows to call the clinic with any problems, questions or concerns. We can certainly see the patient much sooner if necessary.  Carlton Adam PA-C   ADDENDUM: Hematology/Oncology Attending: I had a face to face encounter with the patient. I recommended her care plan. This is a very pleasant 68 years old white female with metastatic non-small cell lung cancer, adenocarcinoma currently undergoing systemic chemotherapy with carboplatin and Alimta status post 1 cycle. The patient tolerated the first cycle of her treatment fairly well with no significant adverse effects except for mild fatigue and nausea. I recommended for her to proceed with cycle #2 In 2 weeks as scheduled. The patient would come back for follow-up visit in 3 weeks for reevaluation before starting cycle #3 of her treatment. She was advised to call immediately if she has any concerning symptoms in the interval.  Disclaimer: This note was dictated with voice recognition software. Similar sounding words can inadvertently be transcribed and may not be corrected upon review. Eilleen Kempf., MD   06/10/2014

## 2014-06-08 NOTE — Telephone Encounter (Signed)
Per staff message and POF I have scheduled appts. Advised scheduler of appts. JMW  

## 2014-06-11 NOTE — Patient Instructions (Signed)
Continue with weekly labs as scheduled Follow-up in 2 weeks prior to starting next scheduled cycle of chemotherapy

## 2014-06-14 ENCOUNTER — Other Ambulatory Visit (HOSPITAL_COMMUNITY): Payer: Medicare Other

## 2014-06-14 ENCOUNTER — Encounter (HOSPITAL_COMMUNITY): Payer: Medicare Other | Attending: Internal Medicine

## 2014-06-14 DIAGNOSIS — C3411 Malignant neoplasm of upper lobe, right bronchus or lung: Secondary | ICD-10-CM

## 2014-06-14 DIAGNOSIS — C773 Secondary and unspecified malignant neoplasm of axilla and upper limb lymph nodes: Secondary | ICD-10-CM

## 2014-06-14 DIAGNOSIS — C3491 Malignant neoplasm of unspecified part of right bronchus or lung: Secondary | ICD-10-CM | POA: Diagnosis present

## 2014-06-14 LAB — CBC WITH DIFFERENTIAL/PLATELET
Basophils Absolute: 0 10*3/uL (ref 0.0–0.1)
Basophils Relative: 0 % (ref 0–1)
EOS ABS: 0 10*3/uL (ref 0.0–0.7)
Eosinophils Relative: 1 % (ref 0–5)
HCT: 29.6 % — ABNORMAL LOW (ref 36.0–46.0)
Hemoglobin: 9.7 g/dL — ABNORMAL LOW (ref 12.0–15.0)
LYMPHS ABS: 0.7 10*3/uL (ref 0.7–4.0)
Lymphocytes Relative: 16 % (ref 12–46)
MCH: 28 pg (ref 26.0–34.0)
MCHC: 32.8 g/dL (ref 30.0–36.0)
MCV: 85.3 fL (ref 78.0–100.0)
Monocytes Absolute: 0.3 10*3/uL (ref 0.1–1.0)
Monocytes Relative: 7 % (ref 3–12)
NEUTROS ABS: 3 10*3/uL (ref 1.7–7.7)
NEUTROS PCT: 76 % (ref 43–77)
PLATELETS: 118 10*3/uL — AB (ref 150–400)
RBC: 3.47 MIL/uL — AB (ref 3.87–5.11)
RDW: 16.3 % — ABNORMAL HIGH (ref 11.5–15.5)
WBC: 4 10*3/uL (ref 4.0–10.5)

## 2014-06-14 LAB — COMPREHENSIVE METABOLIC PANEL
ALT: 35 U/L (ref 0–35)
AST: 41 U/L — ABNORMAL HIGH (ref 0–37)
Albumin: 3.5 g/dL (ref 3.5–5.2)
Alkaline Phosphatase: 168 U/L — ABNORMAL HIGH (ref 39–117)
Anion gap: 10 (ref 5–15)
BUN: 7 mg/dL (ref 6–23)
CO2: 27 mEq/L (ref 19–32)
Calcium: 9.1 mg/dL (ref 8.4–10.5)
Chloride: 100 mEq/L (ref 96–112)
Creatinine, Ser: 0.62 mg/dL (ref 0.50–1.10)
GFR calc Af Amer: >90 mL/min (ref >90)
GFR calc non Af Amer: 89 mL/min — ABNORMAL LOW (ref >90)
GLUCOSE: 202 mg/dL — AB (ref 70–99)
POTASSIUM: 4.2 meq/L (ref 3.7–5.3)
SODIUM: 137 meq/L (ref 137–147)
TOTAL PROTEIN: 7.6 g/dL (ref 6.0–8.3)
Total Bilirubin: 1.7 mg/dL — ABNORMAL HIGH (ref 0.3–1.2)

## 2014-06-14 NOTE — Progress Notes (Signed)
LABS FOR CMP,CBCD

## 2014-06-21 ENCOUNTER — Telehealth: Payer: Self-pay | Admitting: *Deleted

## 2014-06-21 ENCOUNTER — Ambulatory Visit: Payer: Medicare Other

## 2014-06-21 ENCOUNTER — Telehealth: Payer: Self-pay | Admitting: Physician Assistant

## 2014-06-21 ENCOUNTER — Other Ambulatory Visit (HOSPITAL_BASED_OUTPATIENT_CLINIC_OR_DEPARTMENT_OTHER): Payer: Medicare Other

## 2014-06-21 ENCOUNTER — Ambulatory Visit (HOSPITAL_BASED_OUTPATIENT_CLINIC_OR_DEPARTMENT_OTHER): Payer: Medicare Other | Admitting: Physician Assistant

## 2014-06-21 ENCOUNTER — Ambulatory Visit (HOSPITAL_BASED_OUTPATIENT_CLINIC_OR_DEPARTMENT_OTHER): Payer: Medicare Other

## 2014-06-21 ENCOUNTER — Encounter: Payer: Self-pay | Admitting: Physician Assistant

## 2014-06-21 VITALS — BP 137/52 | HR 83 | Temp 97.5°F | Resp 18 | Ht 62.0 in | Wt 147.6 lb

## 2014-06-21 DIAGNOSIS — C3491 Malignant neoplasm of unspecified part of right bronchus or lung: Secondary | ICD-10-CM

## 2014-06-21 DIAGNOSIS — C773 Secondary and unspecified malignant neoplasm of axilla and upper limb lymph nodes: Secondary | ICD-10-CM

## 2014-06-21 DIAGNOSIS — R739 Hyperglycemia, unspecified: Secondary | ICD-10-CM

## 2014-06-21 DIAGNOSIS — C3411 Malignant neoplasm of upper lobe, right bronchus or lung: Secondary | ICD-10-CM

## 2014-06-21 DIAGNOSIS — Z5111 Encounter for antineoplastic chemotherapy: Secondary | ICD-10-CM

## 2014-06-21 LAB — COMPREHENSIVE METABOLIC PANEL (CC13)
ALK PHOS: 163 U/L — AB (ref 40–150)
ALT: 19 U/L (ref 0–55)
AST: 22 U/L (ref 5–34)
Albumin: 3.4 g/dL — ABNORMAL LOW (ref 3.5–5.0)
Anion Gap: 8 mEq/L (ref 3–11)
BILIRUBIN TOTAL: 0.76 mg/dL (ref 0.20–1.20)
BUN: 9.2 mg/dL (ref 7.0–26.0)
CO2: 24 mEq/L (ref 22–29)
Calcium: 9.8 mg/dL (ref 8.4–10.4)
Chloride: 103 mEq/L (ref 98–109)
Creatinine: 0.8 mg/dL (ref 0.6–1.1)
Glucose: 326 mg/dl — ABNORMAL HIGH (ref 70–140)
Potassium: 4.5 mEq/L (ref 3.5–5.1)
SODIUM: 135 meq/L — AB (ref 136–145)
TOTAL PROTEIN: 7.2 g/dL (ref 6.4–8.3)

## 2014-06-21 LAB — CBC WITH DIFFERENTIAL/PLATELET
BASO%: 0.2 % (ref 0.0–2.0)
Basophils Absolute: 0 10*3/uL (ref 0.0–0.1)
EOS%: 0 % (ref 0.0–7.0)
Eosinophils Absolute: 0 10*3/uL (ref 0.0–0.5)
HEMATOCRIT: 29 % — AB (ref 34.8–46.6)
HEMOGLOBIN: 9.5 g/dL — AB (ref 11.6–15.9)
LYMPH%: 10.6 % — ABNORMAL LOW (ref 14.0–49.7)
MCH: 28 pg (ref 25.1–34.0)
MCHC: 32.8 g/dL (ref 31.5–36.0)
MCV: 85.3 fL (ref 79.5–101.0)
MONO#: 0.6 10*3/uL (ref 0.1–0.9)
MONO%: 10.2 % (ref 0.0–14.0)
NEUT#: 4.5 10*3/uL (ref 1.5–6.5)
NEUT%: 79 % — ABNORMAL HIGH (ref 38.4–76.8)
Platelets: 372 10*3/uL (ref 145–400)
RBC: 3.39 10*6/uL — ABNORMAL LOW (ref 3.70–5.45)
RDW: 17.8 % — ABNORMAL HIGH (ref 11.2–14.5)
WBC: 5.7 10*3/uL (ref 3.9–10.3)
lymph#: 0.6 10*3/uL — ABNORMAL LOW (ref 0.9–3.3)

## 2014-06-21 MED ORDER — SODIUM CHLORIDE 0.9 % IV SOLN
525.0000 mg/m2 | Freq: Once | INTRAVENOUS | Status: AC
  Administered 2014-06-21: 900 mg via INTRAVENOUS
  Filled 2014-06-21: qty 36

## 2014-06-21 MED ORDER — ONDANSETRON 16 MG/50ML IVPB (CHCC)
INTRAVENOUS | Status: AC
  Filled 2014-06-21: qty 16

## 2014-06-21 MED ORDER — DEXAMETHASONE SODIUM PHOSPHATE 20 MG/5ML IJ SOLN
20.0000 mg | Freq: Once | INTRAMUSCULAR | Status: AC
  Administered 2014-06-21: 20 mg via INTRAVENOUS

## 2014-06-21 MED ORDER — SODIUM CHLORIDE 0.9 % IV SOLN
399.0000 mg | Freq: Once | INTRAVENOUS | Status: AC
  Administered 2014-06-21: 400 mg via INTRAVENOUS
  Filled 2014-06-21: qty 40

## 2014-06-21 MED ORDER — DEXAMETHASONE SODIUM PHOSPHATE 20 MG/5ML IJ SOLN
INTRAMUSCULAR | Status: AC
  Filled 2014-06-21: qty 5

## 2014-06-21 MED ORDER — SODIUM CHLORIDE 0.9 % IV SOLN
Freq: Once | INTRAVENOUS | Status: AC
  Administered 2014-06-21: 13:00:00 via INTRAVENOUS

## 2014-06-21 MED ORDER — ONDANSETRON 16 MG/50ML IVPB (CHCC)
16.0000 mg | Freq: Once | INTRAVENOUS | Status: AC
  Administered 2014-06-21: 16 mg via INTRAVENOUS

## 2014-06-21 NOTE — Patient Instructions (Signed)
[facility] Discharge Instructions for Patients Receiving Chemotherapy  Today you received the following chemotherapy agents Alimta/Carboplatin.   To help prevent nausea and vomiting after your treatment, we encourage you to take your nausea medication as directed.    If you develop nausea and vomiting that is not controlled by your nausea medication, call the clinic.   BELOW ARE SYMPTOMS THAT SHOULD BE REPORTED IMMEDIATELY:  *FEVER GREATER THAN 100.5 F  *CHILLS WITH OR WITHOUT FEVER  NAUSEA AND VOMITING THAT IS NOT CONTROLLED WITH YOUR NAUSEA MEDICATION  *UNUSUAL SHORTNESS OF BREATH  *UNUSUAL BRUISING OR BLEEDING  TENDERNESS IN MOUTH AND THROAT WITH OR WITHOUT PRESENCE OF ULCERS  *URINARY PROBLEMS  *BOWEL PROBLEMS  UNUSUAL RASH Items with * indicate a potential emergency and should be followed up as soon as possible.  Feel free to call the clinic you have any questions or concerns. The clinic phone number is [phone].

## 2014-06-21 NOTE — Progress Notes (Addendum)
[facility] [phone]   [phone]  OFFICE PROGRESS NOTE  No PCP Per Patient No address on file  DIAGNOSIS: metastatic non-small cell lung cancer, adenocarcinoma. This was initially diagnosed as stage IB (T2a, N0, M0) In March of 2014, status post right upper lobectomy with lymph node dissection but the patient has evidence for disease recurrence in the right axilla status post resection.  PRIOR THERAPY: Status post right axillary lymph node biopsy on 05/01/2014 under the care of Dr. Roxan Hockey  CURRENT THERAPY: Systemic chemotherapy with carboplatin for AUC of 5 and Alimta 500 mg/M2 every 3 weeks. First cycle 05/31/2014. Status post 1 cycle.  INTERVAL HISTORY: Nancy Hodges 68 y.o. female returns to the clinic today for followup visit accompanied by a friend. She is status post one cycle of systemic chemotherapy with carboplatin and Alimta.  Overall she tolerated her first cycle of chemotherapy relatively well. She did have some mild nausea and fatigue however the symptoms have resolved. She notes improvement in her shortness of breath. Recently she's had some increased difficulty with shortness of breath due to the weather changes as well as some sinus congestion and face pressure. She has been using Mucinex, Allegra and Flonase with improvement in her symptoms. She voices no other specific complaints today. She denied any significant chest pain, cough or hemoptysis. She denied weight loss or night sweats. She denied any fever or chills.  MEDICAL HISTORY: Past Medical History  Diagnosis Date  . Asthma   . History of sinusitis   . Allergic rhinitis   . History of kidney stones   . 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 H/O hiatal hernia   . GERD (gastroesophageal reflux disease)   . Pulmonary nodule   . Paroxysmal atrial fibrillation     CHADS score: 0  . Atrial flutter   . Tachycardia-bradycardia syndrome     a. s/p  Nanostim Leadless pacemaker 09/2012.  [name] Anxiety   . Headache(784.0)     sinus  . Lung cancer     a. Stage IB non-small cell carcinoma, s/p R VATS, wedge resection, RU lobectomy 10/2012.  . Diverticulosis   . Internal hemorrhoid   . Dysrhythmia   . Pneumonia June 2014  . Arthritis   . Thin skin   . Bruises easily     patient on Xarelto    ALLERGIES:  is allergic to prednisone; septra; penicillins; and vioxx.  MEDICATIONS:  Current Outpatient Prescriptions  Medication Sig Dispense Refill  . acetaminophen (TYLENOL) 500 MG tablet Take 500 mg by mouth every 6 (six) hours as needed for pain.    [name] albuterol (PROVENTIL) (2.5 MG/3ML) 0.083% nebulizer solution Take 3 mLs (2.5 mg total) by nebulization every 2 (two) hours as needed for shortness of breath. 120 mL 0  . albuterol-ipratropium (COMBIVENT) 18-103 MCG/ACT inhaler Inhale 2 puffs into the lungs every 6 (six) hours as needed for wheezing.    [name] amiodarone (PACERONE) 100 MG tablet Take 1 tablet (100 mg total) by mouth daily. 30 tablet 11  . dexamethasone (DECADRON) 4 MG tablet 4 mg po bid the day before, day of and day after chemo 40 tablet 1  . diltiazem (CARDIZEM CD) 240 MG 24 hr capsule Take 1 capsule (240 mg total) by mouth daily. 30 capsule 10  . fexofenadine (ALLEGRA) 180 MG tablet Take 180 mg by mouth daily.    . fluticasone (FLONASE) 50 MCG/ACT nasal spray Place 2 sprays  into the nose daily as needed (for congestion).    . folic acid (FOLVITE) 1 MG tablet Take 1 tablet (1 mg total) by mouth daily. 30 tablet 4  . guaiFENesin (MUCINEX) 600 MG 12 hr tablet Take 600 mg by mouth daily as needed for cough or to loosen phlegm.    [name] guaiFENesin-dextromethorphan (ROBITUSSIN DM) 100-10 MG/5ML syrup Take 5 mLs by mouth 3 (three) times daily as needed for cough. 118 mL 0  . prochlorperazine (COMPAZINE) 10 MG tablet Take 1 tablet (10 mg total) by mouth every 6 (six) hours as needed for nausea or vomiting. 30 tablet 0  . rivaroxaban (XARELTO) 20  MG TABS tablet Take 1 tablet (20 mg total) by mouth daily. 30 tablet 11  . traMADol (ULTRAM) 50 MG tablet Take 1-2 tablets (50-100 mg total) by mouth every 6 (six) hours as needed (pain). 30 tablet 0   No current facility-administered medications for this visit.    SURGICAL HISTORY:  Past Surgical History  Procedure Laterality Date  . Partial hysterectomy    . Pacemaker insertion  09/14/2012    Nanostim (SJM) leadless pacemaker (LEADLESS II STUDY PATIENT) implanted by Dr [name]  . Video assisted thoracoscopy (vats)/wedge resection Right 10/27/2012    Procedure: VIDEO ASSISTED THORACOSCOPY (VATS),RGHT UPPER LOBE LUNG WEDGE RESECTION;  Surgeon: [name], MD;  Location: [place];  Service: Thoracic;  Laterality: Right;  . Lobectomy Right 10/27/2012    Procedure: LOBECTOMY;  Surgeon: [name], MD;  Location: [place];  Service: Thoracic;  Laterality: Right;   RIGHT UPPER LOBECTOMY & Node Dissection  . Abdominal hysterectomy    . Insert / replace / remove pacemaker      St. Jude  . Colonoscopy w/ polypectomy  2015  . Lymph node biopsy Right 05/01/2014    Procedure: LYMPH NODE BIOPSY, Right Axillary;  Surgeon: [name], MD;  Location: [facility];  Service: Thoracic;  Laterality: Right;    REVIEW OF SYSTEMS:  Constitutional: positive for fatigue Eyes: negative Ears, nose, mouth, throat, and face: negative Respiratory: negative Cardiovascular: negative Gastrointestinal: positive for nausea Genitourinary:negative Integument/breast: negative Hematologic/lymphatic: negative Musculoskeletal:negative Neurological: negative Behavioral/Psych: negative Endocrine: negative Allergic/Immunologic: negative   PHYSICAL EXAMINATION: General appearance: alert, cooperative and no distress Head: Normocephalic, without obvious abnormality, atraumatic Neck: no adenopathy, no JVD, supple, symmetrical, trachea midline and thyroid not enlarged, symmetric, no  tenderness/mass/nodules Lymph nodes: Cervical, supraclavicular, and axillary nodes normal. Resp: clear to auscultation bilaterally Back: symmetric, no curvature. ROM normal. No CVA tenderness. Cardio: regular rate and rhythm, S1, S2 normal, no murmur, click, rub or gallop GI: soft, non-tender; bowel sounds normal; no masses,  no organomegaly Extremities: extremities normal, atraumatic, no cyanosis or edema Neurologic: Alert and oriented X 3, normal strength and tone. Normal symmetric reflexes. Normal coordination and gait  ECOG PERFORMANCE STATUS: 1 - Symptomatic but completely ambulatory  Blood pressure 137/52, pulse 83, temperature 97.5 F (36.4 C), temperature source Oral, resp. rate 18, height _0  (1.575 m), weight 147 lb 9.6 oz (66.951 kg), SpO2 99 %.  LABORATORY DATA: Lab Results  Component Value Date   WBC 5.7 06/21/2014   HGB 9.5* 06/21/2014   HCT 29.0* 06/21/2014   MCV 85.3 06/21/2014   PLT 372 06/21/2014      Chemistry      Component Value Date/Time   NA 135* 06/21/2014 1052   NA 137 06/14/2014 0904   K 4.5 06/21/2014 1052   K 4.2 06/14/2014 0904   CL 100  06/14/2014 0904   CO2 24 06/21/2014 1052   CO2 27 06/14/2014 0904   BUN 9.2 06/21/2014 1052   BUN 7 06/14/2014 0904   CREATININE 0.8 06/21/2014 1052   CREATININE 0.62 06/14/2014 0904      Component Value Date/Time   CALCIUM 9.8 06/21/2014 1052   CALCIUM 9.1 06/14/2014 0904   ALKPHOS 163* 06/21/2014 1052   ALKPHOS 168* 06/14/2014 0904   AST 22 06/21/2014 1052   AST 41* 06/14/2014 0904   ALT 19 06/21/2014 1052   ALT 35 06/14/2014 0904   BILITOT 0.76 06/21/2014 1052   BILITOT 1.7* 06/14/2014 0904       RADIOGRAPHIC STUDIES:  ASSESSMENT AND PLAN: This is a very pleasant 68 years old white female recently diagnosed with metastatic non-small cell lung cancer, adenocarcinoma with negative EGFR mutation and negative ALK gene translocation. This was initially diagnosed as stage IB status post right upper  lobectomy but the patient had evidence for disease recurrence in the right axilla status post resection. She is status post one cycle of systemic chemotherapy with carboplatin for an invasive 5 Alimta 500 mg/m. Overall she tolerated first cycle relatively well. Patient was discussed with and also seen by Dr. Julien Nordmann. She'll proceed with cycle #2 today as scheduled. She will continue with weekly labs as scheduled. She'll follow-up in 3 weeks prior to the start of cycle #3. She was advised to call immediately if she has any concerning symptoms in the interval. The patient voices understanding of current disease status and treatment options and is in agreement with the current care plan.  All questions were answered. The patient knows to call the clinic with any problems, questions or concerns. We can certainly see the patient much sooner if necessary.   Disclaimer: This note was dictated with voice recognition software. Similar sounding words can inadvertently be transcribed and may not be corrected upon review. Carlton Adam, PA-C   06/21/2014   ADDENDUM: Hematology/Oncology Attending: I had a face to face encounter with the patient. I recommended her care plan. This is a very pleasant 68 years old white female with metastatic non-small cell lung cancer, adenocarcinoma currently undergoing systemic chemotherapy with carboplatin and Alimta status post 1 cycle. The patient tolerated the first cycle of her treatment fairly well with no significant adverse effects. She denied having any significant fatigue or weakness. The patient denied having any nausea or vomiting. We'll proceed with cycle #2 today as a scheduled. She would come back for follow-up visit in 3 weeks with the start of cycle #3. The patient was advised to call immediately if she has any concerning symptoms in the interval.  Disclaimer: This note was dictated with voice recognition software. Similar sounding words can inadvertently be  transcribed and may be missed upon review. Eilleen Kempf., MD 06/24/2014

## 2014-06-21 NOTE — Telephone Encounter (Signed)
I have adjusted 12/9 appt

## 2014-06-21 NOTE — Telephone Encounter (Signed)
Pt confirmed labs/ov per 11/18 POF, gave pt AVS.... Cherylann Banas, S/w Sharyn Lull at [facility] scheduled pt for referral for 12/03 @10am  pt confirmed..... KJ

## 2014-06-22 ENCOUNTER — Other Ambulatory Visit (HOSPITAL_COMMUNITY): Payer: Self-pay

## 2014-06-24 NOTE — Patient Instructions (Signed)
Continue weekly labs as scheduled Follow-up in 3 weeks prior to the start of her next scheduled cycle of chemotherapy

## 2014-06-26 ENCOUNTER — Ambulatory Visit: Payer: Medicare Other | Admitting: Critical Care Medicine

## 2014-06-28 ENCOUNTER — Encounter (HOSPITAL_BASED_OUTPATIENT_CLINIC_OR_DEPARTMENT_OTHER): Payer: Medicare Other

## 2014-06-28 ENCOUNTER — Other Ambulatory Visit (HOSPITAL_COMMUNITY): Payer: Medicare Other

## 2014-06-28 DIAGNOSIS — C3491 Malignant neoplasm of unspecified part of right bronchus or lung: Secondary | ICD-10-CM

## 2014-06-28 DIAGNOSIS — C773 Secondary and unspecified malignant neoplasm of axilla and upper limb lymph nodes: Secondary | ICD-10-CM

## 2014-06-28 DIAGNOSIS — C3411 Malignant neoplasm of upper lobe, right bronchus or lung: Secondary | ICD-10-CM

## 2014-06-28 DIAGNOSIS — C3490 Malignant neoplasm of unspecified part of unspecified bronchus or lung: Secondary | ICD-10-CM

## 2014-06-28 LAB — COMPREHENSIVE METABOLIC PANEL
ALBUMIN: 3.5 g/dL (ref 3.5–5.2)
ALK PHOS: 131 U/L — AB (ref 39–117)
ALT: 30 U/L (ref 0–35)
ANION GAP: 10 (ref 5–15)
AST: 32 U/L (ref 0–37)
BUN: 11 mg/dL (ref 6–23)
CHLORIDE: 99 meq/L (ref 96–112)
CO2: 28 mEq/L (ref 19–32)
Calcium: 9.7 mg/dL (ref 8.4–10.5)
Creatinine, Ser: 0.58 mg/dL (ref 0.50–1.10)
GFR calc Af Amer: >90 mL/min (ref >90)
GFR calc non Af Amer: >90 mL/min (ref >90)
Glucose, Bld: 177 mg/dL — ABNORMAL HIGH (ref 70–99)
POTASSIUM: 4.6 meq/L (ref 3.7–5.3)
SODIUM: 137 meq/L (ref 137–147)
Total Bilirubin: 0.6 mg/dL (ref 0.3–1.2)
Total Protein: 7.3 g/dL (ref 6.0–8.3)

## 2014-06-28 LAB — CBC WITH DIFFERENTIAL/PLATELET
Basophils Absolute: 0 10*3/uL (ref 0.0–0.1)
Basophils Relative: 0 % (ref 0–1)
EOS ABS: 0 10*3/uL (ref 0.0–0.7)
Eosinophils Relative: 2 % (ref 0–5)
HCT: 30.3 % — ABNORMAL LOW (ref 36.0–46.0)
Hemoglobin: 9.8 g/dL — ABNORMAL LOW (ref 12.0–15.0)
LYMPHS PCT: 52 % — AB (ref 12–46)
Lymphs Abs: 1.3 10*3/uL (ref 0.7–4.0)
MCH: 27.7 pg (ref 26.0–34.0)
MCHC: 32.3 g/dL (ref 30.0–36.0)
MCV: 85.6 fL (ref 78.0–100.0)
Monocytes Absolute: 0.3 10*3/uL (ref 0.1–1.0)
Monocytes Relative: 12 % (ref 3–12)
NEUTROS PCT: 34 % — AB (ref 43–77)
Neutro Abs: 0.8 10*3/uL — ABNORMAL LOW (ref 1.7–7.7)
Platelets: 195 10*3/uL (ref 150–400)
RBC: 3.54 MIL/uL — ABNORMAL LOW (ref 3.87–5.11)
RDW: 16.7 % — ABNORMAL HIGH (ref 11.5–15.5)
WBC: 2.4 10*3/uL — ABNORMAL LOW (ref 4.0–10.5)

## 2014-06-28 NOTE — Progress Notes (Signed)
LABS FOR CBCD,CMP

## 2014-07-05 ENCOUNTER — Encounter (HOSPITAL_COMMUNITY): Payer: Medicare Other | Attending: Internal Medicine

## 2014-07-05 DIAGNOSIS — C3491 Malignant neoplasm of unspecified part of right bronchus or lung: Secondary | ICD-10-CM | POA: Diagnosis not present

## 2014-07-05 DIAGNOSIS — C3411 Malignant neoplasm of upper lobe, right bronchus or lung: Secondary | ICD-10-CM

## 2014-07-05 DIAGNOSIS — C3490 Malignant neoplasm of unspecified part of unspecified bronchus or lung: Secondary | ICD-10-CM

## 2014-07-05 LAB — COMPREHENSIVE METABOLIC PANEL
ALT: 27 U/L (ref 0–35)
ANION GAP: 11 (ref 5–15)
AST: 37 U/L (ref 0–37)
Albumin: 3.5 g/dL (ref 3.5–5.2)
Alkaline Phosphatase: 143 U/L — ABNORMAL HIGH (ref 39–117)
BILIRUBIN TOTAL: 1.1 mg/dL (ref 0.3–1.2)
BUN: 8 mg/dL (ref 6–23)
CHLORIDE: 102 meq/L (ref 96–112)
CO2: 27 meq/L (ref 19–32)
CREATININE: 0.67 mg/dL (ref 0.50–1.10)
Calcium: 9.4 mg/dL (ref 8.4–10.5)
GFR calc Af Amer: >90 mL/min (ref >90)
GFR calc non Af Amer: 87 mL/min — ABNORMAL LOW (ref >90)
Glucose, Bld: 158 mg/dL — ABNORMAL HIGH (ref 70–99)
Potassium: 4.3 mEq/L (ref 3.7–5.3)
Sodium: 140 mEq/L (ref 137–147)
Total Protein: 7.3 g/dL (ref 6.0–8.3)

## 2014-07-05 LAB — CBC WITH DIFFERENTIAL/PLATELET
Basophils Absolute: 0 10*3/uL (ref 0.0–0.1)
Basophils Relative: 0 % (ref 0–1)
EOS PCT: 1 % (ref 0–5)
Eosinophils Absolute: 0 10*3/uL (ref 0.0–0.7)
HEMATOCRIT: 28.5 % — AB (ref 36.0–46.0)
HEMOGLOBIN: 9.2 g/dL — AB (ref 12.0–15.0)
Lymphocytes Relative: 28 % (ref 12–46)
Lymphs Abs: 1 10*3/uL (ref 0.7–4.0)
MCH: 28 pg (ref 26.0–34.0)
MCHC: 32.3 g/dL (ref 30.0–36.0)
MCV: 86.9 fL (ref 78.0–100.0)
MONOS PCT: 10 % (ref 3–12)
Monocytes Absolute: 0.3 10*3/uL (ref 0.1–1.0)
NEUTROS ABS: 2.2 10*3/uL (ref 1.7–7.7)
Neutrophils Relative %: 61 % (ref 43–77)
Platelets: 86 10*3/uL — ABNORMAL LOW (ref 150–400)
RBC: 3.28 MIL/uL — ABNORMAL LOW (ref 3.87–5.11)
RDW: 18.1 % — ABNORMAL HIGH (ref 11.5–15.5)
WBC: 3.6 10*3/uL — ABNORMAL LOW (ref 4.0–10.5)

## 2014-07-05 NOTE — Progress Notes (Signed)
LABS FOR CMP,CBCD

## 2014-07-12 ENCOUNTER — Encounter: Payer: Self-pay | Admitting: Internal Medicine

## 2014-07-12 ENCOUNTER — Telehealth: Payer: Self-pay | Admitting: Internal Medicine

## 2014-07-12 ENCOUNTER — Other Ambulatory Visit (HOSPITAL_BASED_OUTPATIENT_CLINIC_OR_DEPARTMENT_OTHER): Payer: Medicare Other

## 2014-07-12 ENCOUNTER — Ambulatory Visit: Payer: Medicare Other

## 2014-07-12 ENCOUNTER — Ambulatory Visit (HOSPITAL_BASED_OUTPATIENT_CLINIC_OR_DEPARTMENT_OTHER): Payer: Medicare Other | Admitting: Internal Medicine

## 2014-07-12 VITALS — BP 142/54 | HR 88 | Temp 97.9°F | Resp 18 | Ht 62.0 in | Wt 148.7 lb

## 2014-07-12 DIAGNOSIS — C3411 Malignant neoplasm of upper lobe, right bronchus or lung: Secondary | ICD-10-CM

## 2014-07-12 DIAGNOSIS — C3491 Malignant neoplasm of unspecified part of right bronchus or lung: Secondary | ICD-10-CM

## 2014-07-12 DIAGNOSIS — C773 Secondary and unspecified malignant neoplasm of axilla and upper limb lymph nodes: Secondary | ICD-10-CM

## 2014-07-12 LAB — COMPREHENSIVE METABOLIC PANEL (CC13)
ALT: 24 U/L (ref 0–55)
ANION GAP: 11 meq/L (ref 3–11)
AST: 26 U/L (ref 5–34)
Albumin: 3.6 g/dL (ref 3.5–5.0)
Alkaline Phosphatase: 153 U/L — ABNORMAL HIGH (ref 40–150)
BUN: 9.4 mg/dL (ref 7.0–26.0)
CHLORIDE: 103 meq/L (ref 98–109)
CO2: 24 meq/L (ref 22–29)
Calcium: 10 mg/dL (ref 8.4–10.4)
Creatinine: 0.7 mg/dL (ref 0.6–1.1)
EGFR: 82 mL/min/{1.73_m2} — ABNORMAL LOW (ref >90)
Glucose: 237 mg/dl — ABNORMAL HIGH (ref 70–140)
Potassium: 4.3 mEq/L (ref 3.5–5.1)
Sodium: 138 mEq/L (ref 136–145)
TOTAL PROTEIN: 7.2 g/dL (ref 6.4–8.3)
Total Bilirubin: 0.9 mg/dL (ref 0.20–1.20)

## 2014-07-12 LAB — CBC WITH DIFFERENTIAL/PLATELET
BASO%: 0 % (ref 0.0–2.0)
Basophils Absolute: 0 10*3/uL (ref 0.0–0.1)
EOS%: 0 % (ref 0.0–7.0)
Eosinophils Absolute: 0 10*3/uL (ref 0.0–0.5)
HCT: 29.4 % — ABNORMAL LOW (ref 34.8–46.6)
HGB: 9.5 g/dL — ABNORMAL LOW (ref 11.6–15.9)
LYMPH%: 8.7 % — ABNORMAL LOW (ref 14.0–49.7)
MCH: 28.4 pg (ref 25.1–34.0)
MCHC: 32.3 g/dL (ref 31.5–36.0)
MCV: 87.8 fL (ref 79.5–101.0)
MONO#: 0.6 10*3/uL (ref 0.1–0.9)
MONO%: 9.9 % (ref 0.0–14.0)
NEUT#: 4.7 10*3/uL (ref 1.5–6.5)
NEUT%: 81.4 % — ABNORMAL HIGH (ref 38.4–76.8)
Platelets: 288 10*3/uL (ref 145–400)
RBC: 3.35 10*6/uL — AB (ref 3.70–5.45)
RDW: 20.4 % — ABNORMAL HIGH (ref 11.2–14.5)
WBC: 5.8 10*3/uL (ref 3.9–10.3)
lymph#: 0.5 10*3/uL — ABNORMAL LOW (ref 0.9–3.3)

## 2014-07-12 NOTE — Telephone Encounter (Signed)
Gave avs & cal for Dec. °

## 2014-07-12 NOTE — Progress Notes (Signed)
[facility] [phone]   [phone]  OFFICE PROGRESS NOTE  No PCP Per Patient No address on file  DIAGNOSIS: Metastatic non-small cell lung cancer, adenocarcinoma. This was initially diagnosed as stage IB (T2a, N0, M0) In March of 2014, status post right upper lobectomy with lymph node dissection but the patient has evidence for disease recurrence in the right axilla status post resection.  PRIOR THERAPY: Status post right axillary lymph node biopsy on 05/01/2014 under the care of Dr. Roxan Hockey  CURRENT THERAPY: Systemic chemotherapy with carboplatin for AUC of 5 and Alimta 500 mg/M2 every 3 weeks. First cycle 05/31/2014. Status post 2 cycles.  INTERVAL HISTORY: Nancy Hodges 68 y.o. female returns to the clinic today for followup visit. The patient is currently undergoing systemic chemotherapy with carboplatin and Alimta status post 2 cycles and tolerating her treatment fairly well She denied having any significant chest pain, shortness of breath, cough or hemoptysis. She has no weight loss or night sweats. She denied having any significant nausea or vomiting, no fever or chills. She is here today to start cycle #3.  MEDICAL HISTORY: Past Medical History  Diagnosis Date  . Asthma   . History of sinusitis   . Allergic rhinitis   . History of kidney stones   . 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 H/O hiatal hernia   . GERD (gastroesophageal reflux disease)   . Pulmonary nodule   . Paroxysmal atrial fibrillation     CHADS score: 0  . Atrial flutter   . Tachycardia-bradycardia syndrome     a. 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  [name] Anxiety   . Headache(784.0)     sinus  . Lung cancer     a. Stage IB non-small cell carcinoma, s/p R VATS, wedge resection, RU lobectomy 10/2012.  . Diverticulosis   . Internal hemorrhoid   . Dysrhythmia   . Pneumonia June 2014  . Arthritis   . Thin skin   . Bruises easily    patient on Xarelto    ALLERGIES:  is allergic to prednisone; septra; penicillins; and vioxx.  MEDICATIONS:  Current Outpatient Prescriptions  Medication Sig Dispense Refill  . acetaminophen (TYLENOL) 500 MG tablet Take 500 mg by mouth every 6 (six) hours as needed for pain.    [name] albuterol (PROVENTIL) (2.5 MG/3ML) 0.083% nebulizer solution Take 3 mLs (2.5 mg total) by nebulization every 2 (two) hours as needed for shortness of breath. 120 mL 0  . albuterol-ipratropium (COMBIVENT) 18-103 MCG/ACT inhaler Inhale 2 puffs into the lungs every 6 (six) hours as needed for wheezing.    [name] amiodarone (PACERONE) 100 MG tablet Take 1 tablet (100 mg total) by mouth daily. 30 tablet 11  . dexamethasone (DECADRON) 4 MG tablet 4 mg po bid the day before, day of and day after chemo 40 tablet 1  . diltiazem (CARDIZEM CD) 240 MG 24 hr capsule Take 1 capsule (240 mg total) by mouth daily. 30 capsule 10  . fexofenadine (ALLEGRA) 180 MG tablet Take 180 mg by mouth daily.    . fluticasone (FLONASE) 50 MCG/ACT nasal spray Place 2 sprays into the nose daily as needed (for congestion).    . folic acid (FOLVITE) 1 MG tablet Take 1 tablet (1 mg total) by mouth daily. 30 tablet 4  . guaiFENesin (MUCINEX) 600 MG 12 hr tablet Take 600 mg by mouth daily as needed for cough or to loosen phlegm.    [name]  guaiFENesin-dextromethorphan (ROBITUSSIN DM) 100-10 MG/5ML syrup Take 5 mLs by mouth 3 (three) times daily as needed for cough. 118 mL 0  . JANUVIA 50 MG tablet Take 50 mg by mouth daily.  3  . Olopatadine HCl (PATADAY) 0.2 % SOLN Apply 1 drop to eye daily as needed.    . rivaroxaban (XARELTO) 20 MG TABS tablet Take 1 tablet (20 mg total) by mouth daily. 30 tablet 11  . prochlorperazine (COMPAZINE) 10 MG tablet Take 1 tablet (10 mg total) by mouth every 6 (six) hours as needed for nausea or vomiting. (Patient not taking: Reported on 07/12/2014) 30 tablet 0  . traMADol (ULTRAM) 50 MG tablet Take 1-2 tablets (50-100 mg total) by mouth  every 6 (six) hours as needed (pain). (Patient not taking: Reported on 07/12/2014) 30 tablet 0   No current facility-administered medications for this visit.    SURGICAL HISTORY:  Past Surgical History  Procedure Laterality Date  . Partial hysterectomy    . Pacemaker insertion  09/14/2012    Nanostim (SJM) leadless pacemaker (LEADLESS II STUDY PATIENT) implanted by Dr [name]  . Video assisted thoracoscopy (vats)/wedge resection Right 10/27/2012    Procedure: VIDEO ASSISTED THORACOSCOPY (VATS),RGHT UPPER LOBE LUNG WEDGE RESECTION;  Surgeon: [name], MD;  Location: [facility];  Service: Thoracic;  Laterality: Right;  . Lobectomy Right 10/27/2012    Procedure: LOBECTOMY;  Surgeon: [name], MD;  Location: [facility];  Service: Thoracic;  Laterality: Right;   RIGHT UPPER LOBECTOMY & Node Dissection  . Abdominal hysterectomy    . Insert / replace / remove pacemaker      [employer]  . Colonoscopy w/ polypectomy  2015  . Lymph node biopsy Right 05/01/2014    Procedure: LYMPH NODE BIOPSY, Right Axillary;  Surgeon: [name], MD;  Location: [facility];  Service: Thoracic;  Laterality: Right;    REVIEW OF SYSTEMS:  Constitutional: negative Eyes: negative Ears, nose, mouth, throat, and face: negative Respiratory: negative Cardiovascular: negative Gastrointestinal: negative Genitourinary:negative Integument/breast: negative Hematologic/lymphatic: negative Musculoskeletal:negative Neurological: negative Behavioral/Psych: negative Endocrine: negative Allergic/Immunologic: negative   PHYSICAL EXAMINATION: General appearance: alert, cooperative and no distress Head: Normocephalic, without obvious abnormality, atraumatic Neck: no adenopathy, no JVD, supple, symmetrical, trachea midline and thyroid not enlarged, symmetric, no tenderness/mass/nodules Lymph nodes: Cervical, supraclavicular, and axillary nodes normal. Resp: clear to auscultation bilaterally Back: symmetric, no  curvature. ROM normal. No CVA tenderness. Cardio: regular rate and rhythm, S1, S2 normal, no murmur, click, rub or gallop GI: soft, non-tender; bowel sounds normal; no masses,  no organomegaly Extremities: extremities normal, atraumatic, no cyanosis or edema Neurologic: Alert and oriented X 3, normal strength and tone. Normal symmetric reflexes. Normal coordination and gait  ECOG PERFORMANCE STATUS: 1 - Symptomatic but completely ambulatory  Blood pressure 142/54, pulse 88, temperature 97.9 F (36.6 C), temperature source Oral, resp. rate 18, height 5' 2"  (1.575 m), weight 148 lb 11.2 oz (67.45 kg).  LABORATORY DATA: Lab Results  Component Value Date   WBC 5.8 07/12/2014   HGB 9.5* 07/12/2014   HCT 29.4* 07/12/2014   MCV 87.8 07/12/2014   PLT 288 07/12/2014      Chemistry      Component Value Date/Time   NA 138 07/12/2014 0959   NA 140 07/05/2014 0847   K 4.3 07/12/2014 0959   K 4.3 07/05/2014 0847   CL 102 07/05/2014 0847   CO2 24 07/12/2014 0959   CO2 27 07/05/2014 0847   BUN 9.4 07/12/2014 0959  BUN 8 07/05/2014 0847   CREATININE 0.7 07/12/2014 0959   CREATININE 0.67 07/05/2014 0847      Component Value Date/Time   CALCIUM 10.0 07/12/2014 0959   CALCIUM 9.4 07/05/2014 0847   ALKPHOS 153* 07/12/2014 0959   ALKPHOS 143* 07/05/2014 0847   AST 26 07/12/2014 0959   AST 37 07/05/2014 0847   ALT 24 07/12/2014 0959   ALT 27 07/05/2014 0847   BILITOT 0.90 07/12/2014 0959   BILITOT 1.1 07/05/2014 0847       RADIOGRAPHIC STUDIES:  ASSESSMENT AND PLAN: This is a very pleasant 68 years old white female recently diagnosed with metastatic non-small cell lung cancer, adenocarcinoma with negative EGFR mutation and negative ALK gene translocation. This was initially diagnosed as stage IB status post right upper lobectomy but the patient had evidence for disease recurrence in the right axilla status post resection. She is currently undergoing systemic chemotherapy with  carboplatin and Alimta status post 2 cycles and to rating her treatment well. I recommended for the patient to proceed with cycle #3 today as a scheduled. She will come back for follow-up visit in 3 weeks after repeating CT scan of the chest for restaging of her disease. She was advised to call immediately if she has any concerning symptoms in the interval. The patient voices understanding of current disease status and treatment options and is in agreement with the current care plan.  All questions were answered. The patient knows to call the clinic with any problems, questions or concerns. We can certainly see the patient much sooner if necessary.  Disclaimer: This note was dictated with voice recognition software. Similar sounding words can inadvertently be transcribed and may not be corrected upon review.

## 2014-07-12 NOTE — Telephone Encounter (Signed)
New message      Pt want to make an appt with Adonis Huguenin not Dr [name].  I do not know who Adonis Huguenin is.  Please call

## 2014-07-13 ENCOUNTER — Encounter (HOSPITAL_COMMUNITY): Payer: Self-pay | Admitting: Internal Medicine

## 2014-07-13 ENCOUNTER — Ambulatory Visit (HOSPITAL_BASED_OUTPATIENT_CLINIC_OR_DEPARTMENT_OTHER): Payer: Medicare Other

## 2014-07-13 DIAGNOSIS — C3411 Malignant neoplasm of upper lobe, right bronchus or lung: Secondary | ICD-10-CM

## 2014-07-13 DIAGNOSIS — C773 Secondary and unspecified malignant neoplasm of axilla and upper limb lymph nodes: Secondary | ICD-10-CM

## 2014-07-13 DIAGNOSIS — Z5111 Encounter for antineoplastic chemotherapy: Secondary | ICD-10-CM

## 2014-07-13 DIAGNOSIS — C3491 Malignant neoplasm of unspecified part of right bronchus or lung: Secondary | ICD-10-CM

## 2014-07-13 MED ORDER — DEXAMETHASONE SODIUM PHOSPHATE 20 MG/5ML IJ SOLN
INTRAMUSCULAR | Status: AC
  Filled 2014-07-13: qty 5

## 2014-07-13 MED ORDER — ONDANSETRON 16 MG/50ML IVPB (CHCC)
16.0000 mg | Freq: Once | INTRAVENOUS | Status: AC
  Administered 2014-07-13: 16 mg via INTRAVENOUS

## 2014-07-13 MED ORDER — SODIUM CHLORIDE 0.9 % IV SOLN
Freq: Once | INTRAVENOUS | Status: AC
  Administered 2014-07-13: 14:00:00 via INTRAVENOUS

## 2014-07-13 MED ORDER — SODIUM CHLORIDE 0.9 % IV SOLN
500.0000 mg/m2 | Freq: Once | INTRAVENOUS | Status: AC
  Administered 2014-07-13: 850 mg via INTRAVENOUS
  Filled 2014-07-13: qty 34

## 2014-07-13 MED ORDER — SODIUM CHLORIDE 0.9 % IV SOLN
399.0000 mg | Freq: Once | INTRAVENOUS | Status: AC
  Administered 2014-07-13: 400 mg via INTRAVENOUS
  Filled 2014-07-13: qty 40

## 2014-07-13 MED ORDER — HEPARIN SOD (PORK) LOCK FLUSH 100 UNIT/ML IV SOLN
500.0000 [IU] | Freq: Once | INTRAVENOUS | Status: DC | PRN
  Filled 2014-07-13: qty 5

## 2014-07-13 MED ORDER — DEXAMETHASONE SODIUM PHOSPHATE 20 MG/5ML IJ SOLN
20.0000 mg | Freq: Once | INTRAMUSCULAR | Status: AC
Start: 2014-07-13 — End: 2014-07-13
  Administered 2014-07-13: 20 mg via INTRAVENOUS

## 2014-07-13 MED ORDER — ONDANSETRON 16 MG/50ML IVPB (CHCC)
INTRAVENOUS | Status: AC
  Filled 2014-07-13: qty 16

## 2014-07-13 MED ORDER — CYANOCOBALAMIN 1000 MCG/ML IJ SOLN
1000.0000 ug | Freq: Once | INTRAMUSCULAR | Status: AC
  Administered 2014-07-13: 1000 ug via INTRAMUSCULAR

## 2014-07-13 MED ORDER — SODIUM CHLORIDE 0.9 % IJ SOLN
10.0000 mL | INTRAMUSCULAR | Status: DC | PRN
  Filled 2014-07-13: qty 10

## 2014-07-13 MED ORDER — CYANOCOBALAMIN 1000 MCG/ML IJ SOLN
INTRAMUSCULAR | Status: AC
  Filled 2014-07-13: qty 1

## 2014-07-13 NOTE — Telephone Encounter (Signed)
Follow up      Pt want to make an appt to see Adonis Huguenin.  Please call

## 2014-07-13 NOTE — Telephone Encounter (Signed)
Nancy Hodges is calling patient to explain she needs to be set up with [facility]

## 2014-07-13 NOTE — Patient Instructions (Signed)
[facility] Discharge Instructions for Patients Receiving Chemotherapy  Today you received the following chemotherapy agents Alimta/Carboplatin.   To help prevent nausea and vomiting after your treatment, we encourage you to take your nausea medication as directed.    If you develop nausea and vomiting that is not controlled by your nausea medication, call the clinic.   BELOW ARE SYMPTOMS THAT SHOULD BE REPORTED IMMEDIATELY:  *FEVER GREATER THAN 100.5 F  *CHILLS WITH OR WITHOUT FEVER  NAUSEA AND VOMITING THAT IS NOT CONTROLLED WITH YOUR NAUSEA MEDICATION  *UNUSUAL SHORTNESS OF BREATH  *UNUSUAL BRUISING OR BLEEDING  TENDERNESS IN MOUTH AND THROAT WITH OR WITHOUT PRESENCE OF ULCERS  *URINARY PROBLEMS  *BOWEL PROBLEMS  UNUSUAL RASH Items with * indicate a potential emergency and should be followed up as soon as possible.  Feel free to call the clinic you have any questions or concerns. The clinic phone number is [phone].

## 2014-07-19 ENCOUNTER — Encounter (HOSPITAL_BASED_OUTPATIENT_CLINIC_OR_DEPARTMENT_OTHER): Payer: Medicare Other

## 2014-07-19 DIAGNOSIS — C3491 Malignant neoplasm of unspecified part of right bronchus or lung: Secondary | ICD-10-CM

## 2014-07-19 DIAGNOSIS — C3411 Malignant neoplasm of upper lobe, right bronchus or lung: Secondary | ICD-10-CM

## 2014-07-19 DIAGNOSIS — C773 Secondary and unspecified malignant neoplasm of axilla and upper limb lymph nodes: Secondary | ICD-10-CM

## 2014-07-19 DIAGNOSIS — C3490 Malignant neoplasm of unspecified part of unspecified bronchus or lung: Secondary | ICD-10-CM

## 2014-07-19 LAB — CBC WITH DIFFERENTIAL/PLATELET
Basophils Absolute: 0 10*3/uL (ref 0.0–0.1)
Basophils Relative: 0 % (ref 0–1)
EOS PCT: 2 % (ref 0–5)
Eosinophils Absolute: 0.1 10*3/uL (ref 0.0–0.7)
HEMATOCRIT: 30.5 % — AB (ref 36.0–46.0)
HEMOGLOBIN: 9.8 g/dL — AB (ref 12.0–15.0)
LYMPHS PCT: 31 % (ref 12–46)
Lymphs Abs: 0.9 10*3/uL (ref 0.7–4.0)
MCH: 28.2 pg (ref 26.0–34.0)
MCHC: 32.1 g/dL (ref 30.0–36.0)
MCV: 87.6 fL (ref 78.0–100.0)
MONOS PCT: 12 % (ref 3–12)
Monocytes Absolute: 0.4 10*3/uL (ref 0.1–1.0)
NEUTROS ABS: 1.7 10*3/uL (ref 1.7–7.7)
Neutrophils Relative %: 55 % (ref 43–77)
Platelets: 216 10*3/uL (ref 150–400)
RBC: 3.48 MIL/uL — AB (ref 3.87–5.11)
RDW: 18.6 % — ABNORMAL HIGH (ref 11.5–15.5)
WBC: 3 10*3/uL — AB (ref 4.0–10.5)

## 2014-07-19 LAB — COMPREHENSIVE METABOLIC PANEL
ALT: 33 U/L (ref 0–35)
AST: 35 U/L (ref 0–37)
Albumin: 3.4 g/dL — ABNORMAL LOW (ref 3.5–5.2)
Alkaline Phosphatase: 117 U/L (ref 39–117)
Anion gap: 9 (ref 5–15)
BILIRUBIN TOTAL: 0.7 mg/dL (ref 0.3–1.2)
BUN: 11 mg/dL (ref 6–23)
CHLORIDE: 102 meq/L (ref 96–112)
CO2: 28 meq/L (ref 19–32)
Calcium: 9.2 mg/dL (ref 8.4–10.5)
Creatinine, Ser: 0.6 mg/dL (ref 0.50–1.10)
Glucose, Bld: 115 mg/dL — ABNORMAL HIGH (ref 70–99)
Potassium: 4.8 mEq/L (ref 3.7–5.3)
Sodium: 139 mEq/L (ref 137–147)
Total Protein: 7.1 g/dL (ref 6.0–8.3)

## 2014-07-19 NOTE — Progress Notes (Signed)
LABS FOR CMP,CBC

## 2014-07-22 ENCOUNTER — Emergency Department (HOSPITAL_COMMUNITY): Payer: Medicare Other

## 2014-07-22 ENCOUNTER — Emergency Department (HOSPITAL_COMMUNITY)
Admission: EM | Admit: 2014-07-22 | Discharge: 2014-07-22 | Disposition: A | Discharge status: Home / Self Care | Payer: Medicare Other | Attending: Emergency Medicine | Admitting: Emergency Medicine

## 2014-07-22 ENCOUNTER — Encounter (HOSPITAL_COMMUNITY): Payer: Self-pay | Admitting: Emergency Medicine

## 2014-07-22 DIAGNOSIS — K219 Gastro-esophageal reflux disease without esophagitis: Secondary | ICD-10-CM | POA: Insufficient documentation

## 2014-07-22 DIAGNOSIS — Z87442 Personal history of urinary calculi: Secondary | ICD-10-CM | POA: Insufficient documentation

## 2014-07-22 DIAGNOSIS — F419 Anxiety disorder, unspecified: Secondary | ICD-10-CM | POA: Insufficient documentation

## 2014-07-22 DIAGNOSIS — J069 Acute upper respiratory infection, unspecified: Secondary | ICD-10-CM | POA: Diagnosis not present

## 2014-07-22 DIAGNOSIS — Z8701 Personal history of pneumonia (recurrent): Secondary | ICD-10-CM | POA: Diagnosis not present

## 2014-07-22 DIAGNOSIS — Z950 Presence of cardiac pacemaker: Secondary | ICD-10-CM | POA: Diagnosis not present

## 2014-07-22 DIAGNOSIS — Z85118 Personal history of other malignant neoplasm of bronchus and lung: Secondary | ICD-10-CM | POA: Diagnosis not present

## 2014-07-22 DIAGNOSIS — Z7952 Long term (current) use of systemic steroids: Secondary | ICD-10-CM | POA: Diagnosis not present

## 2014-07-22 DIAGNOSIS — Z880 Allergy status to penicillin: Secondary | ICD-10-CM | POA: Diagnosis not present

## 2014-07-22 DIAGNOSIS — Z7901 Long term (current) use of anticoagulants: Secondary | ICD-10-CM | POA: Insufficient documentation

## 2014-07-22 DIAGNOSIS — R509 Fever, unspecified: Secondary | ICD-10-CM

## 2014-07-22 DIAGNOSIS — Z87891 Personal history of nicotine dependence: Secondary | ICD-10-CM | POA: Diagnosis not present

## 2014-07-22 DIAGNOSIS — J45909 Unspecified asthma, uncomplicated: Secondary | ICD-10-CM | POA: Insufficient documentation

## 2014-07-22 DIAGNOSIS — M1990 Unspecified osteoarthritis, unspecified site: Secondary | ICD-10-CM | POA: Diagnosis not present

## 2014-07-22 DIAGNOSIS — R51 Headache: Secondary | ICD-10-CM | POA: Diagnosis present

## 2014-07-22 LAB — CBC WITH DIFFERENTIAL/PLATELET
Basophils Absolute: 0 10*3/uL (ref 0.0–0.1)
Basophils Relative: 0 % (ref 0–1)
Eosinophils Absolute: 0 10*3/uL (ref 0.0–0.7)
Eosinophils Relative: 0 % (ref 0–5)
HEMATOCRIT: 23 % — AB (ref 36.0–46.0)
HEMOGLOBIN: 7.5 g/dL — AB (ref 12.0–15.0)
Lymphocytes Relative: 28 % (ref 12–46)
Lymphs Abs: 0.8 10*3/uL (ref 0.7–4.0)
MCH: 27.9 pg (ref 26.0–34.0)
MCHC: 32.6 g/dL (ref 30.0–36.0)
MCV: 85.5 fL (ref 78.0–100.0)
MONO ABS: 0.6 10*3/uL (ref 0.1–1.0)
MONOS PCT: 22 % — AB (ref 3–12)
NEUTROS ABS: 1.3 10*3/uL — AB (ref 1.7–7.7)
Neutrophils Relative %: 49 % (ref 43–77)
Platelets: 118 10*3/uL — ABNORMAL LOW (ref 150–400)
RBC: 2.69 MIL/uL — ABNORMAL LOW (ref 3.87–5.11)
RDW: 18.5 % — ABNORMAL HIGH (ref 11.5–15.5)
WBC: 2.7 10*3/uL — AB (ref 4.0–10.5)

## 2014-07-22 LAB — URINALYSIS, ROUTINE W REFLEX MICROSCOPIC
Bilirubin Urine: NEGATIVE
Glucose, UA: NEGATIVE mg/dL
HGB URINE DIPSTICK: NEGATIVE
Ketones, ur: NEGATIVE mg/dL
Leukocytes, UA: NEGATIVE
Nitrite: NEGATIVE
PROTEIN: NEGATIVE mg/dL
Specific Gravity, Urine: 1.027 (ref 1.005–1.030)
UROBILINOGEN UA: 1 mg/dL (ref 0.0–1.0)
pH: 5.5 (ref 5.0–8.0)

## 2014-07-22 LAB — COMPREHENSIVE METABOLIC PANEL
ALT: 43 U/L — ABNORMAL HIGH (ref 0–35)
ANION GAP: 13 (ref 5–15)
AST: 51 U/L — ABNORMAL HIGH (ref 0–37)
Albumin: 3.5 g/dL (ref 3.5–5.2)
Alkaline Phosphatase: 133 U/L — ABNORMAL HIGH (ref 39–117)
BILIRUBIN TOTAL: 1 mg/dL (ref 0.3–1.2)
BUN: 10 mg/dL (ref 6–23)
CO2: 23 meq/L (ref 19–32)
Calcium: 9.3 mg/dL (ref 8.4–10.5)
Chloride: 100 mEq/L (ref 96–112)
Creatinine, Ser: 0.55 mg/dL (ref 0.50–1.10)
GFR calc Af Amer: >90 mL/min (ref >90)
Glucose, Bld: 97 mg/dL (ref 70–99)
Potassium: 3.8 mEq/L (ref 3.7–5.3)
Sodium: 136 mEq/L — ABNORMAL LOW (ref 137–147)
Total Protein: 7 g/dL (ref 6.0–8.3)

## 2014-07-22 LAB — I-STAT CG4 LACTIC ACID, ED: LACTIC ACID, VENOUS: 1.51 mmol/L (ref 0.5–2.2)

## 2014-07-22 MED ORDER — IPRATROPIUM-ALBUTEROL 0.5-2.5 (3) MG/3ML IN SOLN
3.0000 mL | RESPIRATORY_TRACT | Status: DC
  Filled 2014-07-22: qty 3

## 2014-07-22 MED ORDER — IPRATROPIUM-ALBUTEROL 0.5-2.5 (3) MG/3ML IN SOLN
3.0000 mL | Freq: Once | RESPIRATORY_TRACT | Status: AC
  Administered 2014-07-22: 3 mL via RESPIRATORY_TRACT

## 2014-07-22 MED ORDER — ALBUTEROL SULFATE (2.5 MG/3ML) 0.083% IN NEBU: 2.5000 mg | INHALATION_SOLUTION | Freq: Once | RESPIRATORY_TRACT | Status: DC

## 2014-07-22 MED ORDER — ACETAMINOPHEN 325 MG PO TABS
650.0000 mg | ORAL_TABLET | Freq: Once | ORAL | Status: AC
  Administered 2014-07-22: 650 mg via ORAL
  Filled 2014-07-22: qty 2

## 2014-07-22 MED ORDER — DOXYCYCLINE HYCLATE 100 MG PO CAPS: 100.0000 mg | ORAL_CAPSULE | Freq: Two times a day (BID) | ORAL | Status: DC

## 2014-07-22 MED ORDER — DOXYCYCLINE HYCLATE 100 MG PO TABS
100.0000 mg | ORAL_TABLET | Freq: Once | ORAL | Status: AC
  Administered 2014-07-22: 100 mg via ORAL
  Filled 2014-07-22: qty 1

## 2014-07-22 MED ORDER — IPRATROPIUM BROMIDE 0.02 % IN SOLN: 0.5000 mg | Freq: Once | RESPIRATORY_TRACT | Status: DC

## 2014-07-22 NOTE — ED Notes (Addendum)
Pt to ed co headache, chills and congestion for three days. Pt is cancer pt, last chemo 12/10. Pt reports high temp of 100.3 measured at home. Pt took 500  mg tylenol at 3 am.

## 2014-07-22 NOTE — Discharge Instructions (Signed)
Take Tylenol, if needed for fever or pain. Use your Flonase nasal spray, twice a day for 3 weeks. You can use Afrin nasal spray twice a day for nasal congestion, while you are taking the Flonase. See your doctor, if not better in 2 or 3 days.   Upper Respiratory Infection, Adult An upper respiratory infection (URI) is also sometimes known as the common cold. The upper respiratory tract includes the nose, sinuses, throat, trachea, and bronchi. Bronchi are the airways leading to the lungs. Most people improve within 1 week, but symptoms can last up to 2 weeks. A residual cough may last even longer.  CAUSES Many different viruses can infect the tissues lining the upper respiratory tract. The tissues become irritated and inflamed and often become very moist. Mucus production is also common. A cold is contagious. You can easily spread the virus to others by oral contact. This includes kissing, sharing a glass, coughing, or sneezing. Touching your mouth or nose and then touching a surface, which is then touched by another person, can also spread the virus. SYMPTOMS  Symptoms typically develop 1 to 3 days after you come in contact with a cold virus. Symptoms vary from person to person. They may include:  Runny nose.  Sneezing.  Nasal congestion.  Sinus irritation.  Sore throat.  Loss of voice (laryngitis).  Cough.  Fatigue.  Muscle aches.  Loss of appetite.  Headache.  Low-grade fever. DIAGNOSIS  You might diagnose your own cold based on familiar symptoms, since most people get a cold 2 to 3 times a year. Your caregiver can confirm this based on your exam. Most importantly, your caregiver can check that your symptoms are not due to another disease such as strep throat, sinusitis, pneumonia, asthma, or epiglottitis. Blood tests, throat tests, and X-rays are not necessary to diagnose a common cold, but they may sometimes be helpful in excluding other more serious diseases. Your caregiver  will decide if any further tests are required. RISKS AND COMPLICATIONS  You may be at risk for a more severe case of the common cold if you smoke cigarettes, have chronic heart disease (such as heart failure) or lung disease (such as asthma), or if you have a weakened immune system. The very young and very old are also at risk for more serious infections. Bacterial sinusitis, middle ear infections, and bacterial pneumonia can complicate the common cold. The common cold can worsen asthma and chronic obstructive pulmonary disease (COPD). Sometimes, these complications can require emergency medical care and may be life-threatening. PREVENTION  The best way to protect against getting a cold is to practice good hygiene. Avoid oral or hand contact with people with cold symptoms. Wash your hands often if contact occurs. There is no clear evidence that vitamin C, vitamin E, echinacea, or exercise reduces the chance of developing a cold. However, it is always recommended to get plenty of rest and practice good nutrition. TREATMENT  Treatment is directed at relieving symptoms. There is no cure. Antibiotics are not effective, because the infection is caused by a virus, not by bacteria. Treatment may include:  Increased fluid intake. Sports drinks offer valuable electrolytes, sugars, and fluids.  Breathing heated mist or steam (vaporizer or shower).  Eating chicken soup or other clear broths, and maintaining good nutrition.  Getting plenty of rest.  Using gargles or lozenges for comfort.  Controlling fevers with ibuprofen or acetaminophen as directed by your caregiver.  Increasing usage of your inhaler if you have asthma. Zinc  gel and zinc lozenges, taken in the first 24 hours of the common cold, can shorten the duration and lessen the severity of symptoms. Pain medicines may help with fever, muscle aches, and throat pain. A variety of non-prescription medicines are available to treat congestion and runny  nose. Your caregiver can make recommendations and may suggest nasal or lung inhalers for other symptoms.  HOME CARE INSTRUCTIONS   Only take over-the-counter or prescription medicines for pain, discomfort, or fever as directed by your caregiver.  Use a warm mist humidifier or inhale steam from a shower to increase air moisture. This may keep secretions moist and make it easier to breathe.  Drink enough water and fluids to keep your urine clear or pale yellow.  Rest as needed.  Return to work when your temperature has returned to normal or as your caregiver advises. You may need to stay home longer to avoid infecting others. You can also use a face mask and careful hand washing to prevent spread of the virus. SEEK MEDICAL CARE IF:   After the first few days, you feel you are getting worse rather than better.  You need your caregiver's advice about medicines to control symptoms.  You develop chills, worsening shortness of breath, or brown or red sputum. These may be signs of pneumonia.  You develop yellow or brown nasal discharge or pain in the face, especially when you bend forward. These may be signs of sinusitis.  You develop a fever, swollen neck glands, pain with swallowing, or white areas in the back of your throat. These may be signs of strep throat. SEEK IMMEDIATE MEDICAL CARE IF:   You have a fever.  You develop severe or persistent headache, ear pain, sinus pain, or chest pain.  You develop wheezing, a prolonged cough, cough up blood, or have a change in your usual mucus (if you have chronic lung disease).  You develop sore muscles or a stiff neck. Document Released: 01/14/2001 Document Revised: 10/13/2011 Document Reviewed: 10/26/2013 [facility] Patient Information 2015 [place], [place]. This information is not intended to replace advice given to you by your health care provider. Make sure you discuss any questions you have with your health care provider.

## 2014-07-22 NOTE — ED Provider Notes (Signed)
CSN: [id number]     Arrival date & time 07/22/14  1350 History   First MD Initiated Contact with Patient 07/22/14 1512     Chief Complaint  Patient presents with  . Headache     (Consider location/radiation/quality/duration/timing/severity/associated sxs/prior Treatment) HPI  Nancy Hodges is a 68 y.o. female  who presents for evaluation of headache, chills, sore throat and cough.  She feels like this is another one of her recurrent sinus infections.  Last chemotherapy, was 07/13/2014.  She feels that she has had a low-grade temperature for several days.  She did not eat today.  She is having postnasal drip which she is able to expectorate as clear mucus.  She denies blood in mucus, or productive cough.  She denies chest pain, back pain, abdominal pain, change in bowel or urinary habits.  She is taking her usual medications without relief.  Her last oral dose of Tylenol was 3 AM, this morning.  She states that her sinus infections are typically treated with doxycycline, with improvement.  There are no other known modifying factors.  Past Medical History  Diagnosis Date  . Asthma   . History of sinusitis   . Allergic rhinitis   . History of kidney stones   . 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 H/O hiatal hernia   . GERD (gastroesophageal reflux disease)   . Pulmonary nodule   . Paroxysmal atrial fibrillation     CHADS score: 0  . Atrial flutter   . Tachycardia-bradycardia syndrome     a. 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  [name] Anxiety   . Headache(784.0)     sinus  . Lung cancer     a. Stage IB non-small cell carcinoma, s/p R VATS, wedge resection, RU lobectomy 10/2012.  . Diverticulosis   . Internal hemorrhoid   . Dysrhythmia   . Pneumonia June 2014  . Arthritis   . Thin skin   . Bruises easily     patient on Xarelto   Past Surgical History  Procedure Laterality Date  . Partial hysterectomy    . Pacemaker insertion  09/14/2012    Nanostim  (SJM) leadless pacemaker (LEADLESS II STUDY PATIENT) implanted by Dr [name]  . Video assisted thoracoscopy (vats)/wedge resection Right 10/27/2012    Procedure: VIDEO ASSISTED THORACOSCOPY (VATS),RGHT UPPER LOBE LUNG WEDGE RESECTION;  Surgeon: [name], MD;  Location: [facility];  Service: Thoracic;  Laterality: Right;  . Lobectomy Right 10/27/2012    Procedure: LOBECTOMY;  Surgeon: [name], MD;  Location: [facility];  Service: Thoracic;  Laterality: Right;   RIGHT UPPER LOBECTOMY & Node Dissection  . Abdominal hysterectomy    . Insert / replace / remove pacemaker      St. Jude  . Colonoscopy w/ polypectomy  2015  . Lymph node biopsy Right 05/01/2014    Procedure: LYMPH NODE BIOPSY, Right Axillary;  Surgeon: [name], MD;  Location: [facility];  Service: Thoracic;  Laterality: Right;  . Permanent pacemaker insertion N/A 09/14/2012    Procedure: PERMANENT PACEMAKER INSERTION;  Surgeon: [name], MD;  Location: [facility] CATH LAB;  Service: Cardiovascular;  Laterality: N/A;   Family History  Problem Relation Age of Onset  . Heart disease Mother   . Cancer Mother   . Diabetes Mother   . Asthma Father   . Heart disease Father   . Cancer Brother   . Diabetes Sister   . Cancer Sister   . Diabetes  Brother   . Heart attack Son    History  Substance Use Topics  . Smoking status: Former Smoker -- 2.00 packs/day for 40 years    Types: Cigarettes    Quit date: 08/05/1995  . Smokeless tobacco: Never Used  . Alcohol Use: No   OB History    No data available     Review of Systems  All other systems reviewed and are negative.     Allergies  Prednisone; Septra; Penicillins; and Vioxx  Home Medications   Prior to Admission medications   Medication Sig Start Date End Date Taking? Authorizing Provider  acetaminophen (TYLENOL) 500 MG tablet Take 500 mg by mouth every 6 (six) hours as needed for pain.   Yes Historical Provider, MD  albuterol (PROVENTIL HFA;VENTOLIN HFA)  108 (90 BASE) MCG/ACT inhaler Inhale 1-2 puffs into the lungs every 6 (six) hours as needed for wheezing or shortness of breath.   Yes Historical Provider, MD  albuterol (PROVENTIL) (2.5 MG/3ML) 0.083% nebulizer solution Take 3 mLs (2.5 mg total) by nebulization every 2 (two) hours as needed for shortness of breath. 01/24/13  Yes Brand Males, MD  amiodarone (PACERONE) 100 MG tablet Take 1 tablet (100 mg total) by mouth daily. 04/05/14  Yes [name], MD  dexamethasone (DECADRON) 4 MG tablet 4 mg po bid the day before, day of and day after chemo 05/26/14  Yes Curt Bears, MD  diltiazem (CARDIZEM CD) 240 MG 24 hr capsule Take 1 capsule (240 mg total) by mouth daily. 05/09/14  Yes [name], MD  fexofenadine (ALLEGRA) 180 MG tablet Take 180 mg by mouth daily.   Yes Historical Provider, MD  fluticasone (FLONASE) 50 MCG/ACT nasal spray Place 2 sprays into the nose daily as needed (for congestion).   Yes Historical Provider, MD  folic acid (FOLVITE) 1 MG tablet Take 1 tablet (1 mg total) by mouth daily. 05/26/14  Yes Curt Bears, MD  guaiFENesin (MUCINEX) 600 MG 12 hr tablet Take 600 mg by mouth daily as needed for cough or to loosen phlegm.   Yes Historical Provider, MD  guaiFENesin-dextromethorphan (ROBITUSSIN DM) 100-10 MG/5ML syrup Take 5 mLs by mouth 3 (three) times daily as needed for cough. 01/21/13  Yes Elsie Stain, MD  JANUVIA 50 MG tablet Take 50 mg by mouth daily. 07/06/14  Yes Historical Provider, MD  Olopatadine HCl (PATADAY) 0.2 % SOLN Place 1 drop into both eyes daily as needed (for alleriges).    Yes Historical Provider, MD  [place]   Yes Historical Provider, MD  prochlorperazine (COMPAZINE) 10 MG tablet Take 1 tablet (10 mg total) by mouth every 6 (six) hours as needed for nausea or vomiting. 05/26/14  Yes Curt Bears, MD  rivaroxaban (XARELTO) 20 MG TABS tablet Take 1 tablet (20 mg total) by mouth daily. Patient taking differently: Take 20  mg by mouth daily with supper.  04/05/14  Yes [name], MD  traMADol (ULTRAM) 50 MG tablet Take 1-2 tablets (50-100 mg total) by mouth every 6 (six) hours as needed (pain). 05/01/14  Yes [name], MD   BP 143/67 mmHg  Pulse 91  Temp(Src) 100.5 F (38.1 C) (Rectal)  Resp 20  SpO2 98% Physical Exam  Constitutional: She is oriented to person, place, and time. She appears well-developed.  Elderly, frail  HENT:  Head: Normocephalic and atraumatic.  Right Ear: External ear normal.  Left Ear: External ear normal.  Myofascial tenderness with percussion over frontal maxillary sinuses,  bilaterally.  Eyes: Conjunctivae and EOM are normal. Pupils are equal, round, and reactive to light.  Neck: Normal range of motion and phonation normal. Neck supple.  Cardiovascular: Normal rate, regular rhythm and normal heart sounds.   Pulmonary/Chest: Effort normal and breath sounds normal. No stridor. No respiratory distress. She has no rales. She exhibits no tenderness and no bony tenderness.  Scattered rhonchi, and a few scattered wheezes.  Abdominal: Soft. There is no tenderness.  Musculoskeletal: Normal range of motion.  Neurological: She is alert and oriented to person, place, and time. No cranial nerve deficit or sensory deficit. She exhibits normal muscle tone. Coordination normal.  Skin: Skin is warm, dry and intact.  Psychiatric: She has a normal mood and affect. Her behavior is normal. Judgment and thought content normal.  Nursing note and vitals reviewed.   ED Course  Procedures (including critical care time)  Medications  doxycycline (VIBRA-TABS) tablet 100 mg (not administered)  ipratropium-albuterol (DUONEB) 0.5-2.5 (3) MG/3ML nebulizer solution 3 mL (3 mLs Nebulization Given 07/22/14 1636)    Patient Vitals for the past 24 hrs:  BP Temp Temp src Pulse Resp SpO2  07/22/14 1521 143/67 mmHg 100.5 F (38.1 C) Rectal 91 20 98 %  07/22/14 1514 - 100.5 F (38.1 C) Rectal - - -   07/22/14 1417 - 100 F (37.8 C) Oral - - -  07/22/14 1407 173/89 mmHg 99.3 F (37.4 C) Oral 100 20 100 %   Consultation oncology- discussed.  Leukocytosis without evidence for absolute neutropenia.  The patient was given Neulasta, and Dr. Julien Nordmann believes that her white count will improve.  He states that she does not need repeat labs, and agrees with discharge with home treatment.   4:40 PM Reevaluation with update and discussion. After initial assessment and treatment, an updated evaluation reveals patient is comfortable.  Findings discussed with patient and daughter, all questions answered.Daleen Bo L     Labs Review Labs Reviewed  CBC WITH DIFFERENTIAL - Abnormal; Notable for the following:    WBC 2.7 (*)    RBC 2.69 (*)    Hemoglobin 7.5 (*)    HCT 23.0 (*)    RDW 18.5 (*)    Platelets 118 (*)    Neutro Abs 1.3 (*)    Monocytes Relative 22 (*)    All other components within normal limits  COMPREHENSIVE METABOLIC PANEL - Abnormal; Notable for the following:    Sodium 136 (*)    AST 51 (*)    ALT 43 (*)    Alkaline Phosphatase 133 (*)    All other components within normal limits  URINALYSIS, ROUTINE W REFLEX MICROSCOPIC - Abnormal; Notable for the following:    APPearance CLOUDY (*)    All other components within normal limits  CULTURE, BLOOD (ROUTINE X 2)  CULTURE, BLOOD (ROUTINE X 2)  URINE CULTURE  I-STAT CG4 LACTIC ACID, ED    Imaging Review Dg Chest Port 1 View  07/22/2014   CLINICAL DATA:  Fever for 3 days, cough, congestion for 3-4 days.  EXAM: PORTABLE CHEST - 1 VIEW  COMPARISON:  PET-CT 05/17/2014  FINDINGS: There is bibasilar scarring. There is no focal consolidation, pleural effusion or pneumothorax. There is right lateral pleural thickening unchanged from the prior exam. Stable cardiomediastinal silhouette. No acute osseous abnormality. Surgical clips in the right axilla.  IMPRESSION: No acute cardiopulmonary disease.   Electronically Signed   By:  Kathreen Devoid   On: 07/22/2014 14:42     EKG  Interpretation None      MDM   Final diagnoses:  Fever  URI, acute    Signs and symptoms of URI with out clear evidence for influenza.  The patient was immunized against seasonal influenza on 03/04/2014.  There is no evidence for community acquired pneumonia, respiratory distress or serious bacterial infection.  She has a relative leukocytosis without absolute neutropenia.   Nursing Notes Reviewed/ Care Coordinated Applicable Imaging Reviewed Interpretation of Laboratory Data incorporated into ED treatment  The patient appears reasonably screened and/or stabilized for discharge and I doubt any other medical condition or other [facility] requiring further screening, evaluation, or treatment in the ED at this time prior to discharge.  Plan: Home Medications- Doxycycline; Home Treatments- rest; return here if the recommended treatment, does not improve the symptoms; Recommended follow up- PCP prn  Richarda Blade, MD 07/22/14 1724

## 2014-07-23 LAB — URINE CULTURE: Colony Count: 9000

## 2014-07-26 ENCOUNTER — Encounter (HOSPITAL_BASED_OUTPATIENT_CLINIC_OR_DEPARTMENT_OTHER): Payer: Medicare Other

## 2014-07-26 DIAGNOSIS — C3491 Malignant neoplasm of unspecified part of right bronchus or lung: Secondary | ICD-10-CM | POA: Diagnosis not present

## 2014-07-26 DIAGNOSIS — C3411 Malignant neoplasm of upper lobe, right bronchus or lung: Secondary | ICD-10-CM

## 2014-07-26 DIAGNOSIS — C3490 Malignant neoplasm of unspecified part of unspecified bronchus or lung: Secondary | ICD-10-CM

## 2014-07-26 LAB — COMPREHENSIVE METABOLIC PANEL
ALK PHOS: 108 U/L (ref 39–117)
ALT: 27 U/L (ref 0–35)
AST: 38 U/L — AB (ref 0–37)
Albumin: 3.7 g/dL (ref 3.5–5.2)
Anion gap: 6 (ref 5–15)
BUN: 9 mg/dL (ref 6–23)
CHLORIDE: 107 meq/L (ref 96–112)
CO2: 26 mmol/L (ref 19–32)
Calcium: 9.1 mg/dL (ref 8.4–10.5)
Creatinine, Ser: 0.51 mg/dL (ref 0.50–1.10)
GFR calc non Af Amer: >90 mL/min (ref >90)
GLUCOSE: 148 mg/dL — AB (ref 70–99)
Potassium: 4.2 mmol/L (ref 3.5–5.1)
Sodium: 139 mmol/L (ref 135–145)
Total Bilirubin: 0.9 mg/dL (ref 0.3–1.2)
Total Protein: 7 g/dL (ref 6.0–8.3)

## 2014-07-26 LAB — CBC WITH DIFFERENTIAL/PLATELET
Basophils Absolute: 0 10*3/uL (ref 0.0–0.1)
Basophils Relative: 0 % (ref 0–1)
Eosinophils Absolute: 0 10*3/uL (ref 0.0–0.7)
Eosinophils Relative: 1 % (ref 0–5)
HCT: 24.3 % — ABNORMAL LOW (ref 36.0–46.0)
Hemoglobin: 7.8 g/dL — ABNORMAL LOW (ref 12.0–15.0)
LYMPHS ABS: 1 10*3/uL (ref 0.7–4.0)
LYMPHS PCT: 27 % (ref 12–46)
MCH: 28.7 pg (ref 26.0–34.0)
MCHC: 32.1 g/dL (ref 30.0–36.0)
MCV: 89.3 fL (ref 78.0–100.0)
Monocytes Absolute: 0.3 10*3/uL (ref 0.1–1.0)
Monocytes Relative: 9 % (ref 3–12)
NEUTROS ABS: 2.3 10*3/uL (ref 1.7–7.7)
Neutrophils Relative %: 64 % (ref 43–77)
PLATELETS: 57 10*3/uL — AB (ref 150–400)
RBC: 2.72 MIL/uL — ABNORMAL LOW (ref 3.87–5.11)
RDW: 20.3 % — ABNORMAL HIGH (ref 11.5–15.5)
WBC: 3.6 10*3/uL — AB (ref 4.0–10.5)

## 2014-07-26 NOTE — Progress Notes (Signed)
LABS FOR CMP,CBCD

## 2014-07-26 NOTE — Progress Notes (Signed)
Quick Note:  Call patient with the result and arrange for 1 or 2 units of PRBCs transfusion tomorrow or Saturday. ______

## 2014-07-27 ENCOUNTER — Ambulatory Visit (HOSPITAL_COMMUNITY)
Admission: RE | Admit: 2014-07-27 | Discharge: 2014-07-27 | Disposition: A | Discharge status: Home / Self Care | Payer: Medicare Other | Source: Ambulatory Visit | Attending: Internal Medicine | Admitting: Internal Medicine

## 2014-07-27 ENCOUNTER — Other Ambulatory Visit: Payer: Self-pay | Admitting: Medical Oncology

## 2014-07-27 ENCOUNTER — Telehealth: Payer: Self-pay | Admitting: Medical Oncology

## 2014-07-27 ENCOUNTER — Ambulatory Visit: Payer: Medicare Other | Admitting: Lab

## 2014-07-27 DIAGNOSIS — T451X5A Adverse effect of antineoplastic and immunosuppressive drugs, initial encounter: Principal | ICD-10-CM

## 2014-07-27 DIAGNOSIS — C799 Secondary malignant neoplasm of unspecified site: Secondary | ICD-10-CM | POA: Insufficient documentation

## 2014-07-27 DIAGNOSIS — D6481 Anemia due to antineoplastic chemotherapy: Secondary | ICD-10-CM | POA: Insufficient documentation

## 2014-07-27 DIAGNOSIS — C3490 Malignant neoplasm of unspecified part of unspecified bronchus or lung: Secondary | ICD-10-CM | POA: Insufficient documentation

## 2014-07-27 DIAGNOSIS — R195 Other fecal abnormalities: Secondary | ICD-10-CM | POA: Diagnosis not present

## 2014-07-27 DIAGNOSIS — K921 Melena: Secondary | ICD-10-CM | POA: Diagnosis not present

## 2014-07-27 LAB — ABO/RH: ABO/RH(D): O POS

## 2014-07-27 NOTE — Telephone Encounter (Signed)
Called pt to come today for type and cross . She needs to arrange transportation today and will call me back.

## 2014-07-27 NOTE — Telephone Encounter (Signed)
-----   Message from Curt Bears, MD sent at 07/26/2014  7:37 PM EST ----- Call patient with the result and arrange for 1 or 2 units of PRBCs transfusion tomorrow or Saturday.

## 2014-07-28 LAB — CULTURE, BLOOD (ROUTINE X 2)
Culture: NO GROWTH
Culture: NO GROWTH

## 2014-07-29 ENCOUNTER — Emergency Department (HOSPITAL_COMMUNITY): Payer: Medicare Other

## 2014-07-29 ENCOUNTER — Encounter (HOSPITAL_COMMUNITY): Payer: Self-pay | Admitting: Emergency Medicine

## 2014-07-29 ENCOUNTER — Ambulatory Visit: Payer: Medicare Other

## 2014-07-29 ENCOUNTER — Other Ambulatory Visit: Payer: Self-pay

## 2014-07-29 ENCOUNTER — Inpatient Hospital Stay (HOSPITAL_COMMUNITY)
Admission: EM | Admit: 2014-07-29 | Discharge: 2014-08-01 | DRG: 378 | Disposition: A | Discharge status: Home / Self Care | Payer: Medicare Other | Attending: Internal Medicine | Admitting: Internal Medicine

## 2014-07-29 DIAGNOSIS — C773 Secondary and unspecified malignant neoplasm of axilla and upper limb lymph nodes: Secondary | ICD-10-CM | POA: Diagnosis present

## 2014-07-29 DIAGNOSIS — Z7901 Long term (current) use of anticoagulants: Secondary | ICD-10-CM

## 2014-07-29 DIAGNOSIS — R040 Epistaxis: Secondary | ICD-10-CM | POA: Diagnosis present

## 2014-07-29 DIAGNOSIS — Z902 Acquired absence of lung [part of]: Secondary | ICD-10-CM | POA: Diagnosis present

## 2014-07-29 DIAGNOSIS — Z79899 Other long term (current) drug therapy: Secondary | ICD-10-CM | POA: Diagnosis not present

## 2014-07-29 DIAGNOSIS — K219 Gastro-esophageal reflux disease without esophagitis: Secondary | ICD-10-CM | POA: Diagnosis present

## 2014-07-29 DIAGNOSIS — Z87442 Personal history of urinary calculi: Secondary | ICD-10-CM | POA: Diagnosis not present

## 2014-07-29 DIAGNOSIS — J449 Chronic obstructive pulmonary disease, unspecified: Secondary | ICD-10-CM | POA: Diagnosis present

## 2014-07-29 DIAGNOSIS — I10 Essential (primary) hypertension: Secondary | ICD-10-CM | POA: Diagnosis present

## 2014-07-29 DIAGNOSIS — K8020 Calculus of gallbladder without cholecystitis without obstruction: Secondary | ICD-10-CM | POA: Diagnosis present

## 2014-07-29 DIAGNOSIS — D6481 Anemia due to antineoplastic chemotherapy: Secondary | ICD-10-CM | POA: Diagnosis present

## 2014-07-29 DIAGNOSIS — Z950 Presence of cardiac pacemaker: Secondary | ICD-10-CM | POA: Diagnosis not present

## 2014-07-29 DIAGNOSIS — J45901 Unspecified asthma with (acute) exacerbation: Secondary | ICD-10-CM | POA: Diagnosis present

## 2014-07-29 DIAGNOSIS — Z87891 Personal history of nicotine dependence: Secondary | ICD-10-CM

## 2014-07-29 DIAGNOSIS — C3411 Malignant neoplasm of upper lobe, right bronchus or lung: Secondary | ICD-10-CM | POA: Diagnosis present

## 2014-07-29 DIAGNOSIS — C3491 Malignant neoplasm of unspecified part of right bronchus or lung: Secondary | ICD-10-CM | POA: Diagnosis present

## 2014-07-29 DIAGNOSIS — K922 Gastrointestinal hemorrhage, unspecified: Secondary | ICD-10-CM | POA: Diagnosis not present

## 2014-07-29 DIAGNOSIS — C3490 Malignant neoplasm of unspecified part of unspecified bronchus or lung: Secondary | ICD-10-CM

## 2014-07-29 DIAGNOSIS — K921 Melena: Principal | ICD-10-CM | POA: Diagnosis present

## 2014-07-29 DIAGNOSIS — J069 Acute upper respiratory infection, unspecified: Secondary | ICD-10-CM | POA: Diagnosis present

## 2014-07-29 DIAGNOSIS — I480 Paroxysmal atrial fibrillation: Secondary | ICD-10-CM | POA: Diagnosis present

## 2014-07-29 DIAGNOSIS — R059 Cough, unspecified: Secondary | ICD-10-CM

## 2014-07-29 DIAGNOSIS — K5730 Diverticulosis of large intestine without perforation or abscess without bleeding: Secondary | ICD-10-CM | POA: Diagnosis present

## 2014-07-29 DIAGNOSIS — R05 Cough: Secondary | ICD-10-CM

## 2014-07-29 DIAGNOSIS — R195 Other fecal abnormalities: Secondary | ICD-10-CM | POA: Diagnosis present

## 2014-07-29 LAB — URINALYSIS, ROUTINE W REFLEX MICROSCOPIC
Bilirubin Urine: NEGATIVE
Glucose, UA: NEGATIVE mg/dL
Hgb urine dipstick: NEGATIVE
Ketones, ur: NEGATIVE mg/dL
Leukocytes, UA: NEGATIVE
Nitrite: NEGATIVE
PROTEIN: NEGATIVE mg/dL
SPECIFIC GRAVITY, URINE: 1.006 (ref 1.005–1.030)
UROBILINOGEN UA: 0.2 mg/dL (ref 0.0–1.0)
pH: 6.5 (ref 5.0–8.0)

## 2014-07-29 LAB — PREPARE RBC (CROSSMATCH)

## 2014-07-29 LAB — CBC WITH DIFFERENTIAL/PLATELET
BASOS ABS: 0 10*3/uL (ref 0.0–0.1)
BASOS PCT: 0 % (ref 0–1)
Eosinophils Absolute: 0 10*3/uL (ref 0.0–0.7)
Eosinophils Relative: 1 % (ref 0–5)
HCT: 26.8 % — ABNORMAL LOW (ref 36.0–46.0)
Hemoglobin: 8.7 g/dL — ABNORMAL LOW (ref 12.0–15.0)
LYMPHS PCT: 24 % (ref 12–46)
Lymphs Abs: 0.9 10*3/uL (ref 0.7–4.0)
MCH: 29 pg (ref 26.0–34.0)
MCHC: 32.5 g/dL (ref 30.0–36.0)
MCV: 89.3 fL (ref 78.0–100.0)
Monocytes Absolute: 0.4 10*3/uL (ref 0.1–1.0)
Monocytes Relative: 9 % (ref 3–12)
NEUTROS ABS: 2.6 10*3/uL (ref 1.7–7.7)
NEUTROS PCT: 66 % (ref 43–77)
Platelets: 154 10*3/uL (ref 150–400)
RBC: 3 MIL/uL — ABNORMAL LOW (ref 3.87–5.11)
RDW: 22.1 % — AB (ref 11.5–15.5)
WBC: 3.9 10*3/uL — AB (ref 4.0–10.5)

## 2014-07-29 LAB — LIPASE, BLOOD: Lipase: 48 U/L (ref 11–59)

## 2014-07-29 LAB — PROTIME-INR
INR: 1.89 — ABNORMAL HIGH (ref 0.00–1.49)
Prothrombin Time: 21.9 seconds — ABNORMAL HIGH (ref 11.6–15.2)

## 2014-07-29 LAB — COMPREHENSIVE METABOLIC PANEL
ALT: 24 U/L (ref 0–35)
ANION GAP: 8 (ref 5–15)
AST: 38 U/L — ABNORMAL HIGH (ref 0–37)
Albumin: 4.1 g/dL (ref 3.5–5.2)
Alkaline Phosphatase: 123 U/L — ABNORMAL HIGH (ref 39–117)
BUN: 13 mg/dL (ref 6–23)
CO2: 23 mmol/L (ref 19–32)
Calcium: 9.4 mg/dL (ref 8.4–10.5)
Chloride: 104 mEq/L (ref 96–112)
Creatinine, Ser: 0.61 mg/dL (ref 0.50–1.10)
GFR calc Af Amer: >90 mL/min (ref >90)
GFR calc non Af Amer: 90 mL/min — ABNORMAL LOW (ref >90)
Glucose, Bld: 113 mg/dL — ABNORMAL HIGH (ref 70–99)
POTASSIUM: 4.3 mmol/L (ref 3.5–5.1)
SODIUM: 135 mmol/L (ref 135–145)
TOTAL PROTEIN: 7.4 g/dL (ref 6.0–8.3)
Total Bilirubin: 0.9 mg/dL (ref 0.3–1.2)

## 2014-07-29 LAB — I-STAT CHEM 8, ED
BUN: 11 mg/dL (ref 6–23)
Calcium, Ion: 1.26 mmol/L (ref 1.13–1.30)
Chloride: 105 mEq/L (ref 96–112)
Creatinine, Ser: 0.7 mg/dL (ref 0.50–1.10)
Glucose, Bld: 113 mg/dL — ABNORMAL HIGH (ref 70–99)
HCT: 30 % — ABNORMAL LOW (ref 36.0–46.0)
Hemoglobin: 10.2 g/dL — ABNORMAL LOW (ref 12.0–15.0)
Potassium: 4.3 mmol/L (ref 3.5–5.1)
SODIUM: 139 mmol/L (ref 135–145)
TCO2: 22 mmol/L (ref 0–100)

## 2014-07-29 LAB — HEMOGLOBIN AND HEMATOCRIT, BLOOD
HCT: 25.5 % — ABNORMAL LOW (ref 36.0–46.0)
HCT: 24.4 % — ABNORMAL LOW (ref 36.0–46.0)
Hemoglobin: 8.3 g/dL — ABNORMAL LOW (ref 12.0–15.0)
Hemoglobin: 7.7 g/dL — ABNORMAL LOW (ref 12.0–15.0)

## 2014-07-29 LAB — I-STAT TROPONIN, ED: Troponin i, poc: 0 ng/mL (ref 0.00–0.08)

## 2014-07-29 LAB — POC OCCULT BLOOD, ED: FECAL OCCULT BLD: POSITIVE — AB

## 2014-07-29 LAB — I-STAT CG4 LACTIC ACID, ED: LACTIC ACID, VENOUS: 0.9 mmol/L (ref 0.5–2.2)

## 2014-07-29 LAB — MAGNESIUM: Magnesium: 1.8 mg/dL (ref 1.5–2.5)

## 2014-07-29 MED ORDER — IPRATROPIUM-ALBUTEROL 0.5-2.5 (3) MG/3ML IN SOLN
3.0000 mL | RESPIRATORY_TRACT | Status: DC
  Administered 2014-07-29 (×2): 3 mL via RESPIRATORY_TRACT
  Filled 2014-07-29 (×2): qty 3

## 2014-07-29 MED ORDER — ONDANSETRON HCL 4 MG/2ML IJ SOLN
4.0000 mg | Freq: Four times a day (QID) | INTRAMUSCULAR | Status: DC | PRN
Start: 2014-07-29 — End: 2014-08-01

## 2014-07-29 MED ORDER — FOLIC ACID 1 MG PO TABS
1.0000 mg | ORAL_TABLET | Freq: Every day | ORAL | Status: DC
  Administered 2014-07-30 – 2014-08-01 (×3): 1 mg via ORAL
  Filled 2014-07-29 (×4): qty 1

## 2014-07-29 MED ORDER — TRAMADOL HCL 50 MG PO TABS
50.0000 mg | ORAL_TABLET | Freq: Four times a day (QID) | ORAL | Status: DC | PRN
  Administered 2014-07-29 – 2014-08-01 (×4): 50 mg via ORAL
  Filled 2014-07-29 (×4): qty 1

## 2014-07-29 MED ORDER — SODIUM CHLORIDE 0.9 % IV BOLUS (SEPSIS)
1000.0000 mL | Freq: Once | INTRAVENOUS | Status: AC
  Administered 2014-07-29: 1000 mL via INTRAVENOUS

## 2014-07-29 MED ORDER — FLUTICASONE PROPIONATE 50 MCG/ACT NA SUSP
2.0000 | Freq: Every day | NASAL | Status: DC | PRN
  Filled 2014-07-29: qty 16

## 2014-07-29 MED ORDER — LEVOFLOXACIN IN D5W 500 MG/100ML IV SOLN
500.0000 mg | INTRAVENOUS | Status: DC
  Administered 2014-07-29 – 2014-07-30 (×2): 500 mg via INTRAVENOUS
  Filled 2014-07-29 (×4): qty 100

## 2014-07-29 MED ORDER — ALBUTEROL SULFATE (2.5 MG/3ML) 0.083% IN NEBU: 2.5000 mg | INHALATION_SOLUTION | RESPIRATORY_TRACT | Status: DC | PRN

## 2014-07-29 MED ORDER — LORATADINE 10 MG PO TABS
10.0000 mg | ORAL_TABLET | Freq: Every day | ORAL | Status: DC
  Filled 2014-07-29: qty 1

## 2014-07-29 MED ORDER — IOHEXOL 300 MG/ML  SOLN
100.0000 mL | Freq: Once | INTRAMUSCULAR | Status: AC | PRN
  Administered 2014-07-29: 100 mL via INTRAVENOUS

## 2014-07-29 MED ORDER — PANTOPRAZOLE SODIUM 40 MG IV SOLR
40.0000 mg | INTRAVENOUS | Status: DC
  Administered 2014-07-30 – 2014-07-31 (×2): 40 mg via INTRAVENOUS
  Filled 2014-07-29 (×2): qty 40

## 2014-07-29 MED ORDER — CETYLPYRIDINIUM CHLORIDE 0.05 % MT LIQD
7.0000 mL | Freq: Two times a day (BID) | OROMUCOSAL | Status: DC
  Administered 2014-07-30 – 2014-08-01 (×5): 7 mL via OROMUCOSAL

## 2014-07-29 MED ORDER — GUAIFENESIN 100 MG/5ML PO SOLN
200.0000 mg | ORAL | Status: DC | PRN
  Filled 2014-07-29: qty 10

## 2014-07-29 MED ORDER — GUAIFENESIN-DM 100-10 MG/5ML PO SYRP
5.0000 mL | ORAL_SOLUTION | Freq: Three times a day (TID) | ORAL | Status: DC | PRN
  Administered 2014-07-29 – 2014-07-30 (×2): 5 mL via ORAL
  Filled 2014-07-29 (×2): qty 10

## 2014-07-29 MED ORDER — IOHEXOL 300 MG/ML  SOLN
50.0000 mL | Freq: Once | INTRAMUSCULAR | Status: AC | PRN
  Administered 2014-07-29: 50 mL via ORAL

## 2014-07-29 MED ORDER — PANTOPRAZOLE SODIUM 40 MG IV SOLR
40.0000 mg | Freq: Once | INTRAVENOUS | Status: AC
  Administered 2014-07-29: 40 mg via INTRAVENOUS
  Filled 2014-07-29: qty 40

## 2014-07-29 MED ORDER — AMIODARONE HCL 100 MG PO TABS
100.0000 mg | ORAL_TABLET | Freq: Every day | ORAL | Status: DC
  Administered 2014-07-30 – 2014-08-01 (×3): 100 mg via ORAL
  Filled 2014-07-29 (×3): qty 1

## 2014-07-29 MED ORDER — DILTIAZEM HCL ER COATED BEADS 240 MG PO CP24
240.0000 mg | ORAL_CAPSULE | Freq: Every day | ORAL | Status: DC
  Administered 2014-07-30 – 2014-08-01 (×3): 240 mg via ORAL
  Filled 2014-07-29 (×3): qty 1

## 2014-07-29 MED ORDER — ONDANSETRON HCL 4 MG PO TABS: 4.0000 mg | ORAL_TABLET | Freq: Four times a day (QID) | ORAL | Status: DC | PRN

## 2014-07-29 MED ORDER — SODIUM CHLORIDE 0.9 % IV SOLN: 10.0000 mL/h | Freq: Once | INTRAVENOUS | Status: DC

## 2014-07-29 MED ORDER — VITAMINS A & D EX OINT
TOPICAL_OINTMENT | CUTANEOUS | Status: AC
  Administered 2014-07-29: 21:00:00
  Filled 2014-07-29: qty 5

## 2014-07-29 MED ORDER — FEXOFENADINE HCL 180 MG PO TABS
180.0000 mg | ORAL_TABLET | Freq: Every day | ORAL | Status: DC
  Administered 2014-07-29 – 2014-08-01 (×4): 180 mg via ORAL
  Filled 2014-07-29 (×4): qty 1

## 2014-07-29 MED ORDER — IPRATROPIUM-ALBUTEROL 0.5-2.5 (3) MG/3ML IN SOLN
3.0000 mL | Freq: Four times a day (QID) | RESPIRATORY_TRACT | Status: DC
  Administered 2014-07-30 – 2014-08-01 (×10): 3 mL via RESPIRATORY_TRACT
  Filled 2014-07-29 (×10): qty 3

## 2014-07-29 NOTE — ED Provider Notes (Signed)
Medical screening examination/treatment/procedure(s) were conducted as a shared visit with non-physician practitioner(s) and myself.  I personally evaluated the patient during the encounter.   EKG Interpretation   Date/Time:  Saturday July 29 2014 10:00:45 EST Ventricular Rate:  82 PR Interval:  202 QRS Duration: 101 QT Interval:  398 QTC Calculation: 465 R Axis:   70 Text Interpretation:  Sinus rhythm Low voltage, precordial leads RSR' in  V1 or V2, right VCD or RVH Baseline wander in lead(s) V3 No significant  change was found Confirmed by CAMPOS  MD, Lennette Bihari ([id number]) on 07/29/2014  10:04:20 AM      68 yo female with hx of lung cancer who presents from the cancer center where she was to receive a blood transfusion until she reported worsening lower abdominal pain and dark stools.  On exam, well appearing, nontoxic, not distressed, normal respiratory effort, normal perfusion, abdomen soft but mildly tender in upper abdomen and moderate tenderness in lower abdomen.  Plan labs and CT.    Per PA [place], stool melanic.  Admitted for further treatment of anemia and GI bleeding.   Clinical Impression: 1. Gastrointestinal hemorrhage, unspecified gastritis, unspecified gastrointestinal hemorrhage type   2. Cough   3. Malignant neoplasm of lung, unspecified laterality, unspecified part of lung     Artis Delay, MD 07/29/14 [id number]

## 2014-07-29 NOTE — ED Provider Notes (Signed)
CSN: [id number]     Arrival date & time 07/29/14  3419 History   First MD Initiated Contact with Patient 07/29/14 0919     Chief Complaint  Patient presents with  . Blood In Stools  . Abdominal Cramping  . coughing up blood      (Consider location/radiation/quality/duration/timing/severity/associated sxs/prior Treatment) The history is provided by the patient. No language interpreter was used.  Nancy Hodges is a 68 y/o F with PMHx of Stage IB Non-small cell lung carcinoma diagnosed 2 years ago currently undergoing chemo - third session 08/02/2014 - last session 07/12/2014, kidney stones, Afib with St. Jude PM, presenting to the ED with lower abdominal pain and blood in the stools that have been ongoing for the past couple of days. Patient reported that the blood in her stools is a dark black that has become somewhat mucus consistency. Patient reported that with each bowel movement she has there is dark stools. Stated that she has been having increased abdominal pain localized to the lower portion of her abdomen described as a cramping sensation that is constant with radiation to her lower back. Patient reported that she has been feeling nauseous, but denies episodes of emesis. Stated that she has been having fatigue, chills, and intermittent fevers. Patient reported that she has been having light-headedness that has worsened over the past couple of days. Stated that she did have a colonoscopy in July 2015 with removal of 2 polyps, diagnosis of internal hemorrhoids and diverticulosis. Stated that she has been having a cough and was seen in the ED where she was diagnosed with a URI - stated that she was prescribed albuterol, doxycycline that she has been taking as prescribed. Patient reported that she was due to get blood transfused today and stated that she told the [facility] about this and stated that she was recommended to come to the ED to be further assessed. Stated that she had an episode  of epistaxis on Wednesday night and stated that she was coughing up blood only then, but denied bright red blood - stated that she was coughing up since she swallowed the blood from the epistaxis. Denied bright red blood, vomiting, diarrhea, chest pain, shortness of breath, dizziness, syncopal episodes. PCP in [place], [place] [facility] in [place], [place]  Past Medical History  Diagnosis Date  . Asthma   . History of sinusitis   . Allergic rhinitis   . History of kidney stones   . 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 H/O hiatal hernia   . GERD (gastroesophageal reflux disease)   . Pulmonary nodule   . Paroxysmal atrial fibrillation     CHADS score: 0  . Atrial flutter   . Tachycardia-bradycardia syndrome     a. 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  [name] Anxiety   . Headache(784.0)     sinus  . Lung cancer     a. Stage IB non-small cell carcinoma, s/p R VATS, wedge resection, RU lobectomy 10/2012.  . Diverticulosis   . Internal hemorrhoid   . Dysrhythmia   . Pneumonia June 2014  . Arthritis   . Thin skin   . Bruises easily     patient on Xarelto   Past Surgical History  Procedure Laterality Date  . Partial hysterectomy    . Pacemaker insertion  09/14/2012    Nanostim (SJM) leadless pacemaker (LEADLESS II STUDY PATIENT) implanted by Dr [name]  . Video assisted thoracoscopy (vats)/wedge resection Right 10/27/2012  Procedure: VIDEO ASSISTED THORACOSCOPY (VATS),RGHT UPPER LOBE LUNG WEDGE RESECTION;  Surgeon: [name], MD;  Location: [facility];  Service: Thoracic;  Laterality: Right;  . Lobectomy Right 10/27/2012    Procedure: LOBECTOMY;  Surgeon: [name], MD;  Location: [facility];  Service: Thoracic;  Laterality: Right;   RIGHT UPPER LOBECTOMY & Node Dissection  . Abdominal hysterectomy    . Insert / replace / remove pacemaker      St. Jude  . Colonoscopy w/ polypectomy  2015  . Lymph node biopsy Right 05/01/2014    Procedure: LYMPH NODE  BIOPSY, Right Axillary;  Surgeon: [name], MD;  Location: [facility];  Service: Thoracic;  Laterality: Right;  . Permanent pacemaker insertion N/A 09/14/2012    Procedure: PERMANENT PACEMAKER INSERTION;  Surgeon: [name], MD;  Location: [facility];  Service: Cardiovascular;  Laterality: N/A;   Family History  Problem Relation Age of Onset  . Heart disease Mother   . Cancer Mother   . Diabetes Mother   . Asthma Father   . Heart disease Father   . Cancer Brother   . Diabetes Sister   . Cancer Sister   . Diabetes Brother   . Heart attack Son    History  Substance Use Topics  . Smoking status: Former Smoker -- 2.00 packs/day for 40 years    Types: Cigarettes    Quit date: 08/05/1995  . Smokeless tobacco: Never Used  . Alcohol Use: No   OB History    No data available     Review of Systems  Constitutional: Positive for fever, chills and fatigue.  Respiratory: Positive for cough. Negative for chest tightness and shortness of breath.   Cardiovascular: Negative for chest pain.  Gastrointestinal: Positive for nausea, abdominal pain and blood in stool. Negative for vomiting, diarrhea, constipation and anal bleeding.  Musculoskeletal: Negative for back pain and neck pain.  Neurological: Positive for light-headedness. Negative for dizziness, weakness and headaches.      Allergies  Prednisone; Septra; Penicillins; and Vioxx  Home Medications   Prior to Admission medications   Medication Sig Start Date End Date Taking? Authorizing Provider  acetaminophen (TYLENOL) 500 MG tablet Take 500 mg by mouth every 6 (six) hours as needed for moderate pain or fever.    Yes Historical Provider, MD  albuterol (PROVENTIL HFA;VENTOLIN HFA) 108 (90 BASE) MCG/ACT inhaler Inhale 1-2 puffs into the lungs every 6 (six) hours as needed for wheezing or shortness of breath.   Yes Historical Provider, MD  albuterol (PROVENTIL) (2.5 MG/3ML) 0.083% nebulizer solution Take 3 mLs (2.5 mg total)  by nebulization every 2 (two) hours as needed for shortness of breath. 01/24/13  Yes Brand Males, MD  amiodarone (PACERONE) 100 MG tablet Take 1 tablet (100 mg total) by mouth daily. 04/05/14  Yes [name], MD  dexamethasone (DECADRON) 4 MG tablet 4 mg po bid the day before, day of and day after chemo Patient taking differently: Take 4 mg by mouth 2 (two) times daily. the day before, day of and day after chemo 05/26/14  Yes Curt Bears, MD  diltiazem (CARDIZEM CD) 240 MG 24 hr capsule Take 1 capsule (240 mg total) by mouth daily. 05/09/14  Yes [name], MD  doxycycline (VIBRAMYCIN) 100 MG capsule Take 1 capsule (100 mg total) by mouth 2 (two) times daily. 07/22/14  Yes Richarda Blade, MD  fexofenadine (ALLEGRA) 180 MG tablet Take 180 mg by mouth daily.   Yes Historical Provider,  MD  fluticasone (FLONASE) 50 MCG/ACT nasal spray Place 2 sprays into the nose daily as needed (for congestion).   Yes Historical Provider, MD  folic acid (FOLVITE) 1 MG tablet Take 1 tablet (1 mg total) by mouth daily. 05/26/14  Yes Curt Bears, MD  guaiFENesin (MUCINEX) 600 MG 12 hr tablet Take 600 mg by mouth daily as needed for cough or to loosen phlegm.   Yes Historical Provider, MD  guaiFENesin-dextromethorphan (ROBITUSSIN DM) 100-10 MG/5ML syrup Take 5 mLs by mouth 3 (three) times daily as needed for cough. 01/21/13  Yes Elsie Stain, MD  JANUVIA 50 MG tablet Take 50 mg by mouth daily. 07/06/14  Yes Historical Provider, MD  [place]   Yes Historical Provider, MD  prochlorperazine (COMPAZINE) 10 MG tablet Take 1 tablet (10 mg total) by mouth every 6 (six) hours as needed for nausea or vomiting. 05/26/14  Yes Curt Bears, MD  rivaroxaban (XARELTO) 20 MG TABS tablet Take 1 tablet (20 mg total) by mouth daily. Patient taking differently: Take 20 mg by mouth daily with supper.  04/05/14  Yes [name], MD  traMADol (ULTRAM) 50 MG tablet Take 1-2 tablets (50-100 mg total)  by mouth every 6 (six) hours as needed (pain). 05/01/14  Yes [name], MD   BP 152/55 mmHg  Pulse 93  Temp(Src) 98.7 F (37.1 C) (Oral)  Resp 18  SpO2 94% Physical Exam  Constitutional: She is oriented to person, place, and time. She appears well-developed and well-nourished. No distress.  HENT:  Head: Normocephalic and atraumatic.  Mouth/Throat: Oropharynx is clear and moist. No oropharyngeal exudate.  Eyes: Conjunctivae and EOM are normal. Pupils are equal, round, and reactive to light. Right eye exhibits no discharge. Left eye exhibits no discharge.  Neck: Normal range of motion. Neck supple. No tracheal deviation present.  Negative neck stiffness Negative nuchal rigidity  Negative cervical lymphadenopathy  Negative meningeal signs  Cardiovascular: Normal rate, regular rhythm and normal heart sounds.  Exam reveals no friction rub.   No murmur heard. Negative swelling or pitting edema noted to the lower extremities bilaterally   Pulmonary/Chest: Effort normal and breath sounds normal. No respiratory distress. She has no wheezes. She has no rales.  Abdominal: Soft. Bowel sounds are normal. She exhibits no distension. There is tenderness. There is no rebound and no guarding.  Negative abdominal distention Bowel sounds normoactive in all 4 quadrants Abdomen soft upon palpation Discomfort upon palpation to the lower quadrants of the abdomenright lower quadrant, left lower quadrant Negative peritoneal signs  Genitourinary:  Rectal Exem: Negative BRBPR. Negative hemorrhoids, lesions, sores, deformities. Negative active bleeding or oozing noted. Strong sphincter tone. Small amount of dark stools on glove, negative bright red blood.  Exam chaperoned with nurse, Charlann Boxer  Musculoskeletal: Normal range of motion.  Full ROM to upper and lower extremities without difficulty noted, negative ataxia noted.  Lymphadenopathy:    She has no cervical adenopathy.  Neurological: She is  alert and oriented to person, place, and time. No cranial nerve deficit. She exhibits normal muscle tone. Coordination normal.  Cranial nerves III-XII grossly intact Strength 5+/5+ to upper and lower extremities bilaterally with resistance applied, equal distribution noted Negative facial droop Negative slurred speech Negative aphasia Negative arm drift Fine motor skills intact Patient follows commands well  Patient responds to questions appropriately   Skin: Skin is warm and dry. No rash noted. She is not diaphoretic. No erythema.  Psychiatric: She has a normal mood  and affect. Her behavior is normal. Thought content normal.  Nursing note and vitals reviewed.   ED Course  Procedures (including critical care time) Labs Review Labs Reviewed  CBC WITH DIFFERENTIAL - Abnormal; Notable for the following:    WBC 3.9 (*)    RBC 3.00 (*)    Hemoglobin 8.7 (*)    HCT 26.8 (*)    RDW 22.1 (*)    All other components within normal limits  COMPREHENSIVE METABOLIC PANEL - Abnormal; Notable for the following:    Glucose, Bld 113 (*)    AST 38 (*)    Alkaline Phosphatase 123 (*)    GFR calc non Af Amer 90 (*)    All other components within normal limits  PROTIME-INR - Abnormal; Notable for the following:    Prothrombin Time 21.9 (*)    INR 1.89 (*)    All other components within normal limits  I-STAT CHEM 8, ED - Abnormal; Notable for the following:    Glucose, Bld 113 (*)    Hemoglobin 10.2 (*)    HCT 30.0 (*)    All other components within normal limits  POC OCCULT BLOOD, ED - Abnormal; Notable for the following:    Fecal Occult Bld POSITIVE (*)    All other components within normal limits  LIPASE, BLOOD  URINALYSIS, ROUTINE W REFLEX MICROSCOPIC  I-STAT TROPOININ, ED  I-STAT CG4 LACTIC ACID, ED    Imaging Review Ct Abdomen Pelvis W Contrast  07/29/2014   CLINICAL DATA:  Onset of lower abdominal cramping during blood transfusion today, dark stools, pressure feeling to have  bowel movement, productive cough, history asthma, atrial fibrillation, lung cancer, GERD  EXAM: CT ABDOMEN AND PELVIS WITH CONTRAST  TECHNIQUE: Multidetector CT imaging of the abdomen and pelvis was performed using the standard protocol following bolus administration of intravenous contrast. Sagittal and coronal MPR images reconstructed from axial data set.  CONTRAST:  74mL OMNIPAQUE IOHEXOL 300 MG/ML SOLN PO, 155mL OMNIPAQUE IOHEXOL 300 MG/ML SOLN IV  COMPARISON:  PET-CT 05/17/2014  FINDINGS: Scarring at anterior and lateral RIGHT lung base.  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  Calcified gallstones in gallbladder.  Spleen minimally prominent in size.  Liver, spleen, pancreas, kidneys, and adrenal glands otherwise grossly normal.  Scattered atherosclerotic calcifications.  Multiple normal and upper normal sized periportal lymph nodes decreased in size is since prior PET-CT.  Normal appendix.  Sigmoid diverticulosis without evidence of diverticulitis.  Stomach and remaining bowel loops normal appearance.  Colon incompletely distended, no additional gross abnormality seen.  Stomach and small bowel loops normal appearance.  Uterus surgically absent with unremarkable bladder, ureters, and ovaries.  No mass, adenopathy, free fluid, free air, inflammatory process, hernia, or acute bone lesion.  IMPRESSION: Cholelithiasis.  Sigmoid diverticulosis without evidence of diverticulitis.  Decreased periportal adenopathy since prior PET-CT.  No acute intra-abdominal or intrapelvic abnormalities.   Electronically Signed   By: Lavonia Dana M.D.   On: 07/29/2014 11:47   Dg Chest Portable 1 View  07/29/2014   CLINICAL DATA:  Hemoptysis post blood transfusion and cancer center today. Lung cancer.  EXAM: PORTABLE CHEST - 1 VIEW  COMPARISON:  07/22/2014  FINDINGS: Patient is slightly rotated to the right. Lungs are adequately inflated with postsurgical volume loss of the right lung unchanged. Mild stable blunting of  the right costophrenic angle. Surgical sutures over the right perihilar/paramediastinal region. Cardiomediastinal silhouette and remainder of the exam is unchanged.  IMPRESSION: No active disease.  Postsurgical change/volume loss  of the right lung.   Electronically Signed   By: Marin Olp M.D.   On: 07/29/2014 10:37     EKG Interpretation   Date/Time:  Saturday July 29 2014 10:00:45 EST Ventricular Rate:  82 PR Interval:  202 QRS Duration: 101 QT Interval:  398 QTC Calculation: 465 R Axis:   70 Text Interpretation:  Sinus rhythm Low voltage, precordial leads RSR' in  V1 or V2, right VCD or RVH Baseline wander in lead(s) V3 No significant  change was found Confirmed by CAMPOS  MD, Lennette Bihari ([id number]) on 07/29/2014  10:04:20 AM       12:23 PM This provider spoke with Dr. Deatra Ina, GI - discussed case in great detail. Patient to be seen and assessed by GI.   12:57 PM This provider spoke with Dr. Karleen Hampshire, [facility] - discussed case, labs, imaging, vitals, ED course in great detail. Patient to be admitted to Telemetry.  1:08 PM This provider spoke with Dr. Lindi Adie, on-call physician for Oncology. Discussed case in great detail. Reported that oncology will come and see patient. Recommended in basket message to be sent to Dr. Earlie Server so that he is aware.   MDM   Final diagnoses:  Cough  Gastrointestinal hemorrhage, unspecified gastritis, unspecified gastrointestinal hemorrhage type  Malignant neoplasm of lung, unspecified laterality, unspecified part of lung    Medications  pantoprazole (PROTONIX) injection 40 mg (not administered)  sodium chloride 0.9 % bolus 1,000 mL (0 mLs Intravenous Stopped 07/29/14 1231)  iohexol (OMNIPAQUE) 300 MG/ML solution 50 mL (50 mLs Oral Contrast Given 07/29/14 1026)  iohexol (OMNIPAQUE) 300 MG/ML solution 100 mL (100 mLs Intravenous Contrast Given 07/29/14 1109)  sodium chloride 0.9 % bolus 1,000 mL (1,000 mLs Intravenous New Bag/Given 07/29/14  1248)  pantoprazole (PROTONIX) injection 40 mg (40 mg Intravenous Given 07/29/14 1316)   Filed Vitals:   07/29/14 0914 07/29/14 1349  BP: 142/75 152/55  Pulse: 80 93  Temp: 98 F (36.7 C) 98.7 F (37.1 C)  TempSrc: Oral Oral  Resp: 19 18  SpO2: 95% 94%   EKG noted sinus rhythm with a heart rate of 82 bpm-no significant change since last tracing. I-STAT troponin negative elevation. CBC noted white blood cell count of 3.9. Hemoglobin 8.7, hematocrit 26.8.CMP noted elevated AST of 38, elevated alkaline phosphatase of 123. Bilirubin 0.9. Glucose 113. Lipase negative elevation. INR 1.89. Lactic acid negative elevation. Fecal occult positive. Urinalysis unremarkable-negative finding of nitrites, leukocytes. Negative findings of hemoglobin. Chest x-ray no active cardiopulmonary disease-volume loss of right lung secondary to poor surgical procedures. CT abdomen and pelvis with contrast noted cholelithiasis. Sigmoid diverticulosis without any sign of diverticulitis. No intra-abdominal or intra-pelvic abnormalities. CT abdomen and pelvis with contrast negative for acute intra-abdominal processes-negative on is a perforation. Patient presenting to the ED with what appears to be an upper GI bleed. Hemoglobin 8.7-type and screen has already been performed. IV fluids and protonix given. GI and Oncology consulted. Patient to be seen by specialists. Patient stable, afebrile. Patient not septic appearing. Patient stable for transfer.   Jamse Mead, PA-C 07/29/14 [place], MD 07/29/14 [phone]

## 2014-07-29 NOTE — H&P (Signed)
[facility] History and Physical  Nancy Hodges FBP:[id number] DOB: 09-Sep-1945 DOA: 07/29/2014  Referring physician: EDP PCP: No PCP Per Patient   Chief Complaint: Black stools since 2 days.  HPI: Nancy Hodges is a 68 y.o. female with prior h/o paroxysmal atrial fibrillation on xarelto, metastatic non small cell lung cancer s/p resection and chemotherapy, with last chemo on 12/9, hypertension, diverticulosis, hemorrhoids,  comes in to ED for prbc transfusion for a hemoglobin of 7 on 12/24. On arrival to ED her hemoglobin was found to be 8.7 and 10.2 drawn on two different samples at the same time. She also reports being in ED a week ago for URI infection and nose bleeds, for about a week and has been on doxycycline without much improvement. She also reports low grade temp over the last one week. She has malena without frank blood since 2 days. She denies any other complaints. She was referred to hospitalist service for admission for evaluation of malena and possible prbc transfusion if needed. Gastroenterology consulted by EDP. Dr Julien Nordmann aware of patient's admission.    Review of Systems:  Constitutional:  Low grade fevers.  HEENT:  No headaches, Difficulty swallowing,Tooth/dental problems,Sore throat,  Sneezing , nasal congestion, post nasal drip, congestion since one week.  Cardio-vascular:  No chest pain, Orthopnea, PND, swelling in lower extremities, anasarca, dizziness, palpitations  GI:  Abdominal pain upper quadrant more. No nausea or vomiting.  Resp:  Mild productive cough . No sob or chest pain  Skin:  no rash or lesions.  GU:  no dysuria, change in color of urine, no urgency or frequency. No flank pain.  Musculoskeletal:  No joint pain or swelling. No decreased range of motion. No back pain.  Psych:  Irritated.   Past Medical History  Diagnosis Date  . Asthma   . History of sinusitis   . Allergic rhinitis   . History of kidney stones   . Pacemaker  09/14/2012    Nanostim Leadless pacemaker  . H/O hiatal hernia   . GERD (gastroesophageal reflux disease)   . Pulmonary nodule   . Paroxysmal atrial fibrillation     CHADS score: 0  . Atrial flutter   . Tachycardia-bradycardia syndrome     a. 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  [name] Anxiety   . Headache(784.0)     sinus  . Lung cancer     a. Stage IB non-small cell carcinoma, s/p R VATS, wedge resection, RU lobectomy 10/2012.  . Diverticulosis   . Internal hemorrhoid   . Dysrhythmia   . Pneumonia June 2014  . Arthritis   . Thin skin   . Bruises easily     patient on Xarelto   Past Surgical History  Procedure Laterality Date  . Partial hysterectomy    . Pacemaker insertion  09/14/2012    Nanostim (SJM) leadless pacemaker (LEADLESS II STUDY PATIENT) implanted by Dr [name]  . Video assisted thoracoscopy (vats)/wedge resection Right 10/27/2012    Procedure: VIDEO ASSISTED THORACOSCOPY (VATS),RGHT UPPER LOBE LUNG WEDGE RESECTION;  Surgeon: [name], MD;  Location: [place];  Service: Thoracic;  Laterality: Right;  . Lobectomy Right 10/27/2012    Procedure: LOBECTOMY;  Surgeon: [name], MD;  Location: [place];  Service: Thoracic;  Laterality: Right;   RIGHT UPPER LOBECTOMY & Node Dissection  . Abdominal hysterectomy    . Insert / replace / remove pacemaker      St. Jude  . Colonoscopy w/ polypectomy  2015  .  Lymph node biopsy Right 05/01/2014    Procedure: LYMPH NODE BIOPSY, Right Axillary;  Surgeon: [name], MD;  Location: [place];  Service: Thoracic;  Laterality: Right;  . Permanent pacemaker insertion N/A 09/14/2012    Procedure: PERMANENT PACEMAKER INSERTION;  Surgeon: [name], MD;  Location: [facility] CATH LAB;  Service: Cardiovascular;  Laterality: N/A;   Social History:  reports that she quit smoking about 18 years ago. Her smoking use included Cigarettes. She has a 80 pack-year smoking history. She has never used smokeless tobacco. She reports that  she does not drink alcohol or use illicit drugs.  Allergies  Allergen Reactions  . Prednisone Shortness Of Breath and Other (See Comments)    REACTION: double vision  . Septra [Sulfamethoxazole-Trimethoprim] Shortness Of Breath  . Penicillins Swelling and Rash  . Vioxx [Rofecoxib] Other (See Comments)    REACTION:  Stomach cramps    Family History  Problem Relation Age of Onset  . Heart disease Mother   . Cancer Mother   . Diabetes Mother   . Asthma Father   . Heart disease Father   . Cancer Brother   . Diabetes Sister   . Cancer Sister   . Diabetes Brother   . Heart attack Son      Prior to Admission medications   Medication Sig Start Date End Date Taking? Authorizing Provider  acetaminophen (TYLENOL) 500 MG tablet Take 500 mg by mouth every 6 (six) hours as needed for moderate pain or fever.    Yes Historical Provider, MD  albuterol (PROVENTIL HFA;VENTOLIN HFA) 108 (90 BASE) MCG/ACT inhaler Inhale 1-2 puffs into the lungs every 6 (six) hours as needed for wheezing or shortness of breath.   Yes Historical Provider, MD  albuterol (PROVENTIL) (2.5 MG/3ML) 0.083% nebulizer solution Take 3 mLs (2.5 mg total) by nebulization every 2 (two) hours as needed for shortness of breath. 01/24/13  Yes Brand Males, MD  amiodarone (PACERONE) 100 MG tablet Take 1 tablet (100 mg total) by mouth daily. 04/05/14  Yes [name], MD  dexamethasone (DECADRON) 4 MG tablet 4 mg po bid the day before, day of and day after chemo Patient taking differently: Take 4 mg by mouth 2 (two) times daily. the day before, day of and day after chemo 05/26/14  Yes Curt Bears, MD  diltiazem (CARDIZEM CD) 240 MG 24 hr capsule Take 1 capsule (240 mg total) by mouth daily. 05/09/14  Yes [name], MD  doxycycline (VIBRAMYCIN) 100 MG capsule Take 1 capsule (100 mg total) by mouth 2 (two) times daily. 07/22/14  Yes Richarda Blade, MD  fexofenadine (ALLEGRA) 180 MG tablet Take 180 mg by mouth daily.   Yes  Historical Provider, MD  fluticasone (FLONASE) 50 MCG/ACT nasal spray Place 2 sprays into the nose daily as needed (for congestion).   Yes Historical Provider, MD  folic acid (FOLVITE) 1 MG tablet Take 1 tablet (1 mg total) by mouth daily. 05/26/14  Yes Curt Bears, MD  guaiFENesin (MUCINEX) 600 MG 12 hr tablet Take 600 mg by mouth daily as needed for cough or to loosen phlegm.   Yes Historical Provider, MD  guaiFENesin-dextromethorphan (ROBITUSSIN DM) 100-10 MG/5ML syrup Take 5 mLs by mouth 3 (three) times daily as needed for cough. 01/21/13  Yes Elsie Stain, MD  JANUVIA 50 MG tablet Take 50 mg by mouth daily. 07/06/14  Yes Historical Provider, MD  P[place]   Yes Historical Provider, MD  prochlorperazine (COMPAZINE) 10  MG tablet Take 1 tablet (10 mg total) by mouth every 6 (six) hours as needed for nausea or vomiting. 05/26/14  Yes Curt Bears, MD  rivaroxaban (XARELTO) 20 MG TABS tablet Take 1 tablet (20 mg total) by mouth daily. Patient taking differently: Take 20 mg by mouth daily with supper.  04/05/14  Yes [name], MD  traMADol (ULTRAM) 50 MG tablet Take 1-2 tablets (50-100 mg total) by mouth every 6 (six) hours as needed (pain). 05/01/14  Yes [name], MD   Physical Exam: Filed Vitals:   07/29/14 0914 07/29/14 1349  BP: 142/75 152/55  Pulse: 80 93  Temp: 98 F (36.7 C) 98.7 F (37.1 C)  TempSrc: Oral Oral  Resp: 19 18  SpO2: 95% 94%    Wt Readings from Last 3 Encounters:  07/12/14 67.45 kg (148 lb 11.2 oz)  06/21/14 66.951 kg (147 lb 9.6 oz)  06/08/14 65.817 kg (145 lb 1.6 oz)    General:  Appears restless and angry Eyes: PERRL, normal lids, irises & conjunctiva Neck: no LAD, masses or thyromegaly Cardiovascular: RRR, no m/r/g. No LE edema. Respiratory: CTA bilaterally wheezing heard exp bilateral.  Abdomen: soft,mild epigastric tenderness.  Skin: no rash or induration seen on limited exam Musculoskeletal: grossly  normal tone BUE/BLE Neurologic: grossly non-focal.          Labs on Admission:  Basic Metabolic Panel:  Recent Labs Lab 07/26/14 0917 07/29/14 0936 07/29/14 0947  NA 139 135 139  K 4.2 4.3 4.3  CL 107 104 105  CO2 26 23  --   GLUCOSE 148* 113* 113*  BUN 9 13 11   CREATININE 0.51 0.61 0.70  CALCIUM 9.1 9.4  --    Liver Function Tests:  Recent Labs Lab 07/26/14 0917 07/29/14 0936  AST 38* 38*  ALT 27 24  ALKPHOS 108 123*  BILITOT 0.9 0.9  PROT 7.0 7.4  ALBUMIN 3.7 4.1    Recent Labs Lab 07/29/14 0936  LIPASE 48   No results for input(s): AMMONIA in the last 168 hours. CBC:  Recent Labs Lab 07/26/14 0917 07/29/14 0936 07/29/14 0947  WBC 3.6* 3.9*  --   NEUTROABS 2.3 2.6  --   HGB 7.8* 8.7* 10.2*  HCT 24.3* 26.8* 30.0*  MCV 89.3 89.3  --   PLT 57* 154  --    Cardiac Enzymes: No results for input(s): CKTOTAL, CKMB, CKMBINDEX, TROPONINI in the last 168 hours.  BNP (last 3 results) No results for input(s): PROBNP in the last 8760 hours. CBG: No results for input(s): GLUCAP in the last 168 hours.  Radiological Exams on Admission: Ct Abdomen Pelvis W Contrast  07/29/2014   CLINICAL DATA:  Onset of lower abdominal cramping during blood transfusion today, dark stools, pressure feeling to have bowel movement, productive cough, history asthma, atrial fibrillation, lung cancer, GERD  EXAM: CT ABDOMEN AND PELVIS WITH CONTRAST  TECHNIQUE: Multidetector CT imaging of the abdomen and pelvis was performed using the standard protocol following bolus administration of intravenous contrast. Sagittal and coronal MPR images reconstructed from axial data set.  CONTRAST:  86mL OMNIPAQUE IOHEXOL 300 MG/ML SOLN PO, 1105mL OMNIPAQUE IOHEXOL 300 MG/ML SOLN IV  COMPARISON:  PET-CT 05/17/2014  FINDINGS: Scarring at anterior and lateral RIGHT lung base.  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  Calcified gallstones in gallbladder.  Spleen minimally prominent in size.   Liver, spleen, pancreas, kidneys, and adrenal glands otherwise grossly normal.  Scattered atherosclerotic calcifications.  Multiple normal and upper normal  sized periportal lymph nodes decreased in size is since prior PET-CT.  Normal appendix.  Sigmoid diverticulosis without evidence of diverticulitis.  Stomach and remaining bowel loops normal appearance.  Colon incompletely distended, no additional gross abnormality seen.  Stomach and small bowel loops normal appearance.  Uterus surgically absent with unremarkable bladder, ureters, and ovaries.  No mass, adenopathy, free fluid, free air, inflammatory process, hernia, or acute bone lesion.  IMPRESSION: Cholelithiasis.  Sigmoid diverticulosis without evidence of diverticulitis.  Decreased periportal adenopathy since prior PET-CT.  No acute intra-abdominal or intrapelvic abnormalities.   Electronically Signed   By: Lavonia Dana M.D.   On: 07/29/2014 11:47   Dg Chest Portable 1 View  07/29/2014   CLINICAL DATA:  Hemoptysis post blood transfusion and cancer center today. Lung cancer.  EXAM: PORTABLE CHEST - 1 VIEW  COMPARISON:  07/22/2014  FINDINGS: Patient is slightly rotated to the right. Lungs are adequately inflated with postsurgical volume loss of the right lung unchanged. Mild stable blunting of the right costophrenic angle. Surgical sutures over the right perihilar/paramediastinal region. Cardiomediastinal silhouette and remainder of the exam is unchanged.  IMPRESSION: No active disease.  Postsurgical change/volume loss of the right lung.   Electronically Signed   By: Marin Olp M.D.   On: 07/29/2014 10:37    EKG: sinus rhythm  Assessment/Plan Active Problems:   Acute GI bleeding   GI bleed   Malena since 2 days.  Admit the patient for evaluation of GI bleed.  She denies the use of NSAIDS. CLEAR  Liquid diet  IV PPI. Last EGD and colonoscopy was in June 2015. CT abdomen and pelvis today showed diverticulosis without  diverticulitis Gastroenterology on board.  H&h every 8 hours.    Upper respiratory infection with bronchitis.  Nasal decongestant and levaquin for bronchitis.    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    Paroxysmal afib on xarelto: ? Resume anticoagulation. Await gastroenterology recommendations.    Mild Asthma exacerbation: Mild wheezing heard. Will start her Scheduled nebulizer treatments and monitor. She is not hypoxic.    DVT prophylaxis.   Code Status: presumed full code.  DVT Prophylaxis: Family Communication: family at bedside Disposition Plan: admit to telemetry  Time spent: 55 min  [facility] Pager [phone]

## 2014-07-29 NOTE — Progress Notes (Signed)
  Amiodarone Drug - Drug Interaction Consult Note  Recommendations: Pt ordered Levaquin for bronchitis.  Amiodarone is metabolized by the cytochrome P450 system and therefore has the potential to cause many drug interactions. Amiodarone has an average plasma half-life of 50 days (range 20 to 100 days).   There is potential for drug interactions to occur several weeks or months after stopping treatment and the onset of drug interactions may be slow after initiating amiodarone.   []  Statins: Increased risk of myopathy. Simvastatin- restrict dose to 20mg  daily. Other statins: counsel patients to report any muscle pain or weakness immediately.  []  Anticoagulants: Amiodarone can increase anticoagulant effect. Consider warfarin dose reduction. Patients should be monitored closely and the dose of anticoagulant altered accordingly, remembering that amiodarone levels take several weeks to stabilize.  []  Antiepileptics: Amiodarone can increase plasma concentration of phenytoin, the dose should be reduced. Note that small changes in phenytoin dose can result in large changes in levels. Monitor patient and counsel on signs of toxicity.  []  Beta blockers: increased risk of bradycardia, AV block and myocardial depression. Sotalol - avoid concomitant use.  []   Calcium channel blockers (diltiazem and verapamil): increased risk of bradycardia, AV block and myocardial depression.  []   Cyclosporine: Amiodarone increases levels of cyclosporine. Reduced dose of cyclosporine is recommended.  []  Digoxin dose should be halved when amiodarone is started.  []  Diuretics: increased risk of cardiotoxicity if hypokalemia occurs.  []  Oral hypoglycemic agents (glyburide, glipizide, glimepiride): increased risk of hypoglycemia. Patient's glucose levels should be monitored closely when initiating amiodarone therapy.   [x]  Drugs that prolong the QT interval:  Torsades de pointes risk may be increased with concurrent use -  avoid if possible.  Monitor QTc, also keep magnesium/potassium WNL if concurrent therapy can't be avoided. [name] Antibiotics: e.g. fluoroquinolones, erythromycin. . Antiarrhythmics: e.g. quinidine, procainamide, disopyramide, sotalol. . Antipsychotics: e.g. phenothiazines, haloperidol.  . Lithium, tricyclic antidepressants, and methadone.  QTc=465,  K= 4.3, Consider checking a Magnesium  Thank You,  Nancy Hodges, Nancy Hodges  07/29/2014 3:22 PM

## 2014-07-29 NOTE — ED Notes (Signed)
Pt went to Cancer center to get blood transfusion but never had one before, pt states that she has had dark, sticky stools and coughing up blood clots over past couple of days.

## 2014-07-29 NOTE — Progress Notes (Signed)
Pt came for blood transfusion and immediately started telling us her lower abdomen was painful all around to both sides. Was getting worse. Was crampy or pressure like she had to have BM. She states her stools are darker and slimey. She has cough with some production. Her family stated she has been coughing up what look like blood clots. She is currently on doxycycline for URI. Pt is very pale. Pt was amenable to going to ER for further evaluation. Pt escorted in wheelchair to ER.

## 2014-07-29 NOTE — Consult Note (Signed)
Referring Provider: No ref. provider found Primary Care Physician:  No PCP Per Patient Primary Gastroenterologist:  Althia Forts, GI in [facility]  Reason for Consultation:  Melena/heme positive stool  HPI: Nancy Hodges is a 68 y.o. female with Stage IB lung cancer s/p resection and chemo (follows with Dr. 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  Is chronically anemia but had Hgb in 7 gram range and was going to transfused as outpatient.  She then reported black stools over the past two days so was sent to the ED.  In the ED Hgb was found to be 8.7 grams then repeat was actually 10.2 grams, which are improved compared to 7 gram range previously.  Baseline is 9-10 gram range recently.  She admits that her stools have been black in color over the past 2 days but she also admits that she had a large nose bleed with clots on 12/23.  Says that she's had some lower abdominal cramping recently as well.  Admits to RUQ pain but pain seems to be over her ribcage and she says that she has been coughing a lot; no hematemesis or hemoptysis.  CT scan of the abdomen and pelvis with contrast showed no acute issues.  Has seen GI in [place].  Reports that she had an EGD and colonoscopy in 02/2014 at which time she was found to have 2 polyps that were removed and diverticulosis but evaluation was otherwise unremarkable to her knowledge.     Past Medical History  Diagnosis Date  . Asthma   . History of sinusitis   . Allergic rhinitis   . History of kidney stones   . 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 H/O hiatal hernia   . GERD (gastroesophageal reflux disease)   . Pulmonary nodule   . Paroxysmal atrial fibrillation     CHADS score: 0  . Atrial flutter   . Tachycardia-bradycardia syndrome     a. 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  [name] Anxiety   . Headache(784.0)     sinus  . Lung cancer     a. Stage IB non-small cell carcinoma, s/p R VATS, wedge  resection, RU lobectomy 10/2012.  . Diverticulosis   . Internal hemorrhoid   . Dysrhythmia   . Pneumonia June 2014  . Arthritis   . Thin skin   . Bruises easily     patient on Xarelto    Past Surgical History  Procedure Laterality Date  . Partial hysterectomy    . Pacemaker insertion  09/14/2012    Nanostim (SJM) leadless pacemaker (LEADLESS II STUDY PATIENT) implanted by Dr [name]  . Video assisted thoracoscopy (vats)/wedge resection Right 10/27/2012    Procedure: VIDEO ASSISTED THORACOSCOPY (VATS),RGHT UPPER LOBE LUNG WEDGE RESECTION;  Surgeon: [name], MD;  Location: [place];  Service: Thoracic;  Laterality: Right;  . Lobectomy Right 10/27/2012    Procedure: LOBECTOMY;  Surgeon: [name], MD;  Location: [place];  Service: Thoracic;  Laterality: Right;   RIGHT UPPER LOBECTOMY & Node Dissection  . Abdominal hysterectomy    . Insert / replace / remove pacemaker      St. Jude  . Colonoscopy w/ polypectomy  2015  . Lymph node biopsy Right 05/01/2014    Procedure: LYMPH NODE BIOPSY, Right Axillary;  Surgeon: [name], MD;  Location: [place];  Service: Thoracic;  Laterality: Right;  . Permanent pacemaker insertion N/A 09/14/2012    Procedure: PERMANENT PACEMAKER INSERTION;  Surgeon: [name], MD;  Location: [facility];  Service: Cardiovascular;  Laterality: N/A;    Prior to Admission medications   Medication Sig Start Date End Date Taking? Authorizing Provider  acetaminophen (TYLENOL) 500 MG tablet Take 500 mg by mouth every 6 (six) hours as needed for moderate pain or fever.    Yes Historical Provider, MD  albuterol (PROVENTIL HFA;VENTOLIN HFA) 108 (90 BASE) MCG/ACT inhaler Inhale 1-2 puffs into the lungs every 6 (six) hours as needed for wheezing or shortness of breath.   Yes Historical Provider, MD  albuterol (PROVENTIL) (2.5 MG/3ML) 0.083% nebulizer solution Take 3 mLs (2.5 mg total) by nebulization every 2 (two) hours as needed for shortness of breath.  01/24/13  Yes Brand Males, MD  amiodarone (PACERONE) 100 MG tablet Take 1 tablet (100 mg total) by mouth daily. 04/05/14  Yes [name], MD  dexamethasone (DECADRON) 4 MG tablet 4 mg po bid the day before, day of and day after chemo Patient taking differently: Take 4 mg by mouth 2 (two) times daily. the day before, day of and day after chemo 05/26/14  Yes Curt Bears, MD  diltiazem (CARDIZEM CD) 240 MG 24 hr capsule Take 1 capsule (240 mg total) by mouth daily. 05/09/14  Yes [name], MD  doxycycline (VIBRAMYCIN) 100 MG capsule Take 1 capsule (100 mg total) by mouth 2 (two) times daily. 07/22/14  Yes Richarda Blade, MD  fexofenadine (ALLEGRA) 180 MG tablet Take 180 mg by mouth daily.   Yes Historical Provider, MD  fluticasone (FLONASE) 50 MCG/ACT nasal spray Place 2 sprays into the nose daily as needed (for congestion).   Yes Historical Provider, MD  folic acid (FOLVITE) 1 MG tablet Take 1 tablet (1 mg total) by mouth daily. 05/26/14  Yes Curt Bears, MD  guaiFENesin (MUCINEX) 600 MG 12 hr tablet Take 600 mg by mouth daily as needed for cough or to loosen phlegm.   Yes Historical Provider, MD  guaiFENesin-dextromethorphan (ROBITUSSIN DM) 100-10 MG/5ML syrup Take 5 mLs by mouth 3 (three) times daily as needed for cough. 01/21/13  Yes Elsie Stain, MD  JANUVIA 50 MG tablet Take 50 mg by mouth daily. 07/06/14  Yes Historical Provider, MD  [place]   Yes Historical Provider, MD  prochlorperazine (COMPAZINE) 10 MG tablet Take 1 tablet (10 mg total) by mouth every 6 (six) hours as needed for nausea or vomiting. 05/26/14  Yes Curt Bears, MD  rivaroxaban (XARELTO) 20 MG TABS tablet Take 1 tablet (20 mg total) by mouth daily. Patient taking differently: Take 20 mg by mouth daily with supper.  04/05/14  Yes [name], MD  traMADol (ULTRAM) 50 MG tablet Take 1-2 tablets (50-100 mg total) by mouth every 6 (six) hours as needed (pain). 05/01/14  Yes [name], MD    Current Facility-Administered Medications  Medication Dose Route Frequency Provider Last Rate Last Dose  . sodium chloride 0.9 % bolus 1,000 mL  1,000 mL Intravenous Once Jamse Mead, PA-C       Current Outpatient Prescriptions  Medication Sig Dispense Refill  . acetaminophen (TYLENOL) 500 MG tablet Take 500 mg by mouth every 6 (six) hours as needed for moderate pain or fever.     [name] albuterol (PROVENTIL HFA;VENTOLIN HFA) 108 (90 BASE) MCG/ACT inhaler Inhale 1-2 puffs into the lungs every 6 (six) hours as needed for wheezing or shortness of breath.    [name] albuterol (PROVENTIL) (2.5 MG/3ML) 0.083% nebulizer solution Take  3 mLs (2.5 mg total) by nebulization every 2 (two) hours as needed for shortness of breath. 120 mL 0  . amiodarone (PACERONE) 100 MG tablet Take 1 tablet (100 mg total) by mouth daily. 30 tablet 11  . dexamethasone (DECADRON) 4 MG tablet 4 mg po bid the day before, day of and day after chemo (Patient taking differently: Take 4 mg by mouth 2 (two) times daily. the day before, day of and day after chemo) 40 tablet 1  . diltiazem (CARDIZEM CD) 240 MG 24 hr capsule Take 1 capsule (240 mg total) by mouth daily. 30 capsule 10  . doxycycline (VIBRAMYCIN) 100 MG capsule Take 1 capsule (100 mg total) by mouth 2 (two) times daily. 20 capsule 0  . fexofenadine (ALLEGRA) 180 MG tablet Take 180 mg by mouth daily.    . fluticasone (FLONASE) 50 MCG/ACT nasal spray Place 2 sprays into the nose daily as needed (for congestion).    . folic acid (FOLVITE) 1 MG tablet Take 1 tablet (1 mg total) by mouth daily. 30 tablet 4  . guaiFENesin (MUCINEX) 600 MG 12 hr tablet Take 600 mg by mouth daily as needed for cough or to loosen phlegm.    [name] guaiFENesin-dextromethorphan (ROBITUSSIN DM) 100-10 MG/5ML syrup Take 5 mLs by mouth 3 (three) times daily as needed for cough. 118 mL 0  . JANUVIA 50 MG tablet Take 50 mg by mouth daily.  3  . PRESCRIPTION MEDICATION Chemo - [facility]    .  prochlorperazine (COMPAZINE) 10 MG tablet Take 1 tablet (10 mg total) by mouth every 6 (six) hours as needed for nausea or vomiting. 30 tablet 0  . rivaroxaban (XARELTO) 20 MG TABS tablet Take 1 tablet (20 mg total) by mouth daily. (Patient taking differently: Take 20 mg by mouth daily with supper. ) 30 tablet 11  . traMADol (ULTRAM) 50 MG tablet Take 1-2 tablets (50-100 mg total) by mouth every 6 (six) hours as needed (pain). 30 tablet 0    Allergies as of 07/29/2014 - Review Complete 07/29/2014  Allergen Reaction Noted  . Prednisone Shortness Of Breath and Other (See Comments) 06/24/2012  . Septra [sulfamethoxazole-trimethoprim] Shortness Of Breath 06/24/2012  . Penicillins Swelling and Rash 06/24/2012  . Vioxx [rofecoxib] Other (See Comments) 06/24/2012    Family History  Problem Relation Age of Onset  . Heart disease Mother   . Cancer Mother   . Diabetes Mother   . Asthma Father   . Heart disease Father   . Cancer Brother   . Diabetes Sister   . Cancer Sister   . Diabetes Brother   . Heart attack Son     History   Social History  . Marital Status: Single    Spouse Name: N/A    Number of Children: N/A  . Years of Education: N/A   Occupational History  . Not on file.   Social History Main Topics  . Smoking status: Former Smoker -- 2.00 packs/day for 40 years    Types: Cigarettes    Quit date: 08/05/1995  . Smokeless tobacco: Never Used  . Alcohol Use: No  . Drug Use: No  . Sexual Activity: Not on file   Other Topics Concern  . Not on file   Social History Narrative   Patient lives alone in [place].   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   Patient has 1 living daughter     Review of Systems: Ten point ROS is O/W negative except as  mentioned in HPI.  Physical Exam: Vital signs in last 24 hours: Temp:  [98 F (36.7 C)-98.1 F (36.7 C)] 98 F (36.7 C) (12/26 0914) Pulse Rate:  [80] 80 (12/26 0914) Resp:  [19-24] 19 (12/26 0914) BP:  (139-142)/(45-75) 142/75 mmHg (12/26 0914) SpO2:  [95 %] 95 % (12/26 0914)   General:  Alert, Well-developed, well-nourished, pleasant and cooperative in NAD; appear pale Head:  Normocephalic and atraumatic. Eyes:  Sclera clear, no icterus.  Conjunctiva pink. Ears:  Normal auditory acuity. Mouth:  No deformity or lesions.  Poor dentition.   Lungs:  Clear throughout to auscultation.  No wheezes, crackles, or rhonchi.  Heart:  Regular rate and rhythm; no murmurs, clicks, rubs, or gallops. Abdomen:  Soft, non-distended.  BS present.  Mild abdominal TTP although most tenderness is located over her lower ribcage.  Rectal:  Deferred.  Heme positive in the ED.  Msk:  Symmetrical without gross deformities. Pulses:  Normal pulses noted. Extremities:  Without clubbing or edema. Neurologic:  Alert and  oriented x4;  grossly normal neurologically. Skin:  Intact without significant lesions or rashes. Psych:  Alert and cooperative. Normal mood and affect.  Lab Results:  Recent Labs  07/29/14 0936 07/29/14 0947  WBC 3.9*  --   HGB 8.7* 10.2*  HCT 26.8* 30.0*  PLT 154  --    BMET  Recent Labs  07/29/14 0936 07/29/14 0947  NA 135 139  K 4.3 4.3  CL 104 105  CO2 23  --   GLUCOSE 113* 113*  BUN 13 11  CREATININE 0.61 0.70  CALCIUM 9.4  --    LFT  Recent Labs  07/29/14 0936  PROT 7.4  ALBUMIN 4.1  AST 38*  ALT 24  ALKPHOS 123*  BILITOT 0.9   PT/INR  Recent Labs  07/29/14 0936  LABPROT 21.9*  INR 1.89*   Studies/Results: Ct Abdomen Pelvis W Contrast  07/29/2014   CLINICAL DATA:  Onset of lower abdominal cramping during blood transfusion today, dark stools, pressure feeling to have bowel movement, productive cough, history asthma, atrial fibrillation, lung cancer, GERD  EXAM: CT ABDOMEN AND PELVIS WITH CONTRAST  TECHNIQUE: Multidetector CT imaging of the abdomen and pelvis was performed using the standard protocol following bolus administration of intravenous contrast.  Sagittal and coronal MPR images reconstructed from axial data set.  CONTRAST:  67mL OMNIPAQUE IOHEXOL 300 MG/ML SOLN PO, 166mL OMNIPAQUE IOHEXOL 300 MG/ML SOLN IV  COMPARISON:  PET-CT 05/17/2014  FINDINGS: Scarring at anterior and lateral RIGHT lung base.  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  Calcified gallstones in gallbladder.  Spleen minimally prominent in size.  Liver, spleen, pancreas, kidneys, and adrenal glands otherwise grossly normal.  Scattered atherosclerotic calcifications.  Multiple normal and upper normal sized periportal lymph nodes decreased in size is since prior PET-CT.  Normal appendix.  Sigmoid diverticulosis without evidence of diverticulitis.  Stomach and remaining bowel loops normal appearance.  Colon incompletely distended, no additional gross abnormality seen.  Stomach and small bowel loops normal appearance.  Uterus surgically absent with unremarkable bladder, ureters, and ovaries.  No mass, adenopathy, free fluid, free air, inflammatory process, hernia, or acute bone lesion.  IMPRESSION: Cholelithiasis.  Sigmoid diverticulosis without evidence of diverticulitis.  Decreased periportal adenopathy since prior PET-CT.  No acute intra-abdominal or intrapelvic abnormalities.   Electronically Signed   By: Lavonia Dana M.D.   On: 07/29/2014 11:47   Dg Chest Portable 1 View  07/29/2014   CLINICAL DATA:  Hemoptysis post  blood transfusion and cancer center today. Lung cancer.  EXAM: PORTABLE CHEST - 1 VIEW  COMPARISON:  07/22/2014  FINDINGS: Patient is slightly rotated to the right. Lungs are adequately inflated with postsurgical volume loss of the right lung unchanged. Mild stable blunting of the right costophrenic angle. Surgical sutures over the right perihilar/paramediastinal region. Cardiomediastinal silhouette and remainder of the exam is unchanged.  IMPRESSION: No active disease.  Postsurgical change/volume loss of the right lung.   Electronically Signed   By: Marin Olp  M.D.   On: 07/29/2014 10:37    IMPRESSION:  -Black heme positive stools:  Patient had a large nosebleed with clots on 12/23.  She reports recent EGD and colonoscopy in [place] in July of this year that were relatively unremarkable. -Anemia:  Acute on chronic. -Atrial fibrillation, tachy-brady syndrome s/p pacer:  On Xarelto, last dose 12/25.  PLAN: -I think that we can plan to just observe her for now.  Would try to obtain records from endoscopic procedures in [place], however, I do not know how easy that will be over the weekend.   -Monitor Hgb and transfuse prn. -Pantoprazole 40 mg IV daily for now.  ZEHR, JESSICA D.  07/29/2014, 12:33 PM  Pager number [phone]  GI Attending Note   Chart was reviewed and patient was examined. X-rays and lab were reviewed.    I agree with management and plans.  I think it is very possible that melena is due to her nosebleed.  Any bleeding is exacerbated by anticoagulation therapy.  Since active peptic disease is a possibility, would continue empiric PPI therapy.  Okay to continue Xarelto.  Should bleeding recur in the absence of a nosebleed, would proceed with upper endoscopy.  Sandy Salaam. Deatra Ina, M.D., [facility] [phone]

## 2014-07-29 NOTE — ED Notes (Signed)
Patient transported to CT 

## 2014-07-29 NOTE — ED Notes (Signed)
MD at bedside. 

## 2014-07-29 NOTE — ED Notes (Signed)
Attending at the bedside.

## 2014-07-30 DIAGNOSIS — K921 Melena: Principal | ICD-10-CM

## 2014-07-30 LAB — BASIC METABOLIC PANEL
Anion gap: 5 (ref 5–15)
BUN: 7 mg/dL (ref 6–23)
CHLORIDE: 107 meq/L (ref 96–112)
CO2: 25 mmol/L (ref 19–32)
Calcium: 8.7 mg/dL (ref 8.4–10.5)
Creatinine, Ser: 0.51 mg/dL (ref 0.50–1.10)
GFR calc Af Amer: >90 mL/min (ref >90)
GFR calc non Af Amer: >90 mL/min (ref >90)
GLUCOSE: 88 mg/dL (ref 70–99)
POTASSIUM: 3.8 mmol/L (ref 3.5–5.1)
Sodium: 137 mmol/L (ref 135–145)

## 2014-07-30 LAB — HEMOGLOBIN AND HEMATOCRIT, BLOOD
HCT: 23.6 % — ABNORMAL LOW (ref 36.0–46.0)
Hemoglobin: 7.4 g/dL — ABNORMAL LOW (ref 12.0–15.0)

## 2014-07-30 LAB — PREPARE RBC (CROSSMATCH)

## 2014-07-30 MED ORDER — SODIUM CHLORIDE 0.9 % IV SOLN
Freq: Once | INTRAVENOUS | Status: AC
Start: 2014-07-30 — End: 2014-07-30
  Administered 2014-07-30: 11:00:00 via INTRAVENOUS

## 2014-07-30 NOTE — Progress Notes (Signed)
Progress Note   Subjective  **No complaints except for fatigue.  Has not had a bowel movement since admission.*   Objective  Vital signs in last 24 hours: Temp:  [98.1 F (36.7 C)-98.7 F (37.1 C)] 98.1 F (36.7 C) (12/27 0517) Pulse Rate:  [78-93] 78 (12/27 0517) Resp:  [16-18] 18 (12/27 0517) BP: (116-152)/(46-55) 116/48 mmHg (12/27 0517) SpO2:  [94 %-97 %] 96 % (12/27 0812) Weight:  [140 lb 3.2 oz (63.594 kg)] 140 lb 3.2 oz (63.594 kg) (12/26 1529) Last BM Date: 07/29/14 General:   Alert,  Well-developed,   in NAD Heart:  Regular rate and rhythm; no murmurs Abdomen:  Soft, nontender and nondistended. Normal bowel sounds, without guarding, and without rebound.   Extremities:  Without edema. Neurologic:  Alert and  oriented x4;  grossly normal neurologically. Psych:  Alert and cooperative. Normal mood and affect.  Intake/Output from previous day: 12/26 0701 - 12/27 0700 In: 340 [P.O.:240; IV Piggyback:100] Out: 700 [Urine:700] Intake/Output this shift: Total I/O In: 360 [P.O.:360] Out: -   Lab Results:  Recent Labs  07/29/14 0936  07/29/14 1556 07/29/14 2315 07/30/14 0508  WBC 3.9*  --   --   --   --   HGB 8.7*  < > 8.3* 7.7* 7.4*  HCT 26.8*  < > 25.5* 24.4* 23.6*  PLT 154  --   --   --   --   < > = values in this interval not displayed. BMET  Recent Labs  07/29/14 0936 07/29/14 0947 07/30/14 0508  NA 135 139 137  K 4.3 4.3 3.8  CL 104 105 107  CO2 23  --  25  GLUCOSE 113* 113* 88  BUN 13 11 7   CREATININE 0.61 0.70 0.51  CALCIUM 9.4  --  8.7   LFT  Recent Labs  07/29/14 0936  PROT 7.4  ALBUMIN 4.1  AST 38*  ALT 24  ALKPHOS 123*  BILITOT 0.9   PT/INR  Recent Labs  07/29/14 0936  LABPROT 21.9*  INR 1.89*   Hepatitis Panel No results for input(s): HEPBSAG, HCVAB, HEPAIGM, HEPBIGM in the last 72 hours.  Studies/Results: Ct Abdomen Pelvis W Contrast  07/29/2014   CLINICAL DATA:  Onset of lower abdominal cramping during blood  transfusion today, dark stools, pressure feeling to have bowel movement, productive cough, history asthma, atrial fibrillation, lung cancer, GERD  EXAM: CT ABDOMEN AND PELVIS WITH CONTRAST  TECHNIQUE: Multidetector CT imaging of the abdomen and pelvis was performed using the standard protocol following bolus administration of intravenous contrast. Sagittal and coronal MPR images reconstructed from axial data set.  CONTRAST:  103mL OMNIPAQUE IOHEXOL 300 MG/ML SOLN PO, 156mL OMNIPAQUE IOHEXOL 300 MG/ML SOLN IV  COMPARISON:  PET-CT 05/17/2014  FINDINGS: Scarring at anterior and lateral RIGHT lung base.  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  Calcified gallstones in gallbladder.  Spleen minimally prominent in size.  Liver, spleen, pancreas, kidneys, and adrenal glands otherwise grossly normal.  Scattered atherosclerotic calcifications.  Multiple normal and upper normal sized periportal lymph nodes decreased in size is since prior PET-CT.  Normal appendix.  Sigmoid diverticulosis without evidence of diverticulitis.  Stomach and remaining bowel loops normal appearance.  Colon incompletely distended, no additional gross abnormality seen.  Stomach and small bowel loops normal appearance.  Uterus surgically absent with unremarkable bladder, ureters, and ovaries.  No mass, adenopathy, free fluid, free air, inflammatory process, hernia, or acute bone lesion.  IMPRESSION: Cholelithiasis.  Sigmoid diverticulosis  without evidence of diverticulitis.  Decreased periportal adenopathy since prior PET-CT.  No acute intra-abdominal or intrapelvic abnormalities.   Electronically Signed   By: Lavonia Dana M.D.   On: 07/29/2014 11:47   Dg Chest Portable 1 View  07/29/2014   CLINICAL DATA:  Hemoptysis post blood transfusion and cancer center today. Lung cancer.  EXAM: PORTABLE CHEST - 1 VIEW  COMPARISON:  07/22/2014  FINDINGS: Patient is slightly rotated to the right. Lungs are adequately inflated with postsurgical volume  loss of the right lung unchanged. Mild stable blunting of the right costophrenic angle. Surgical sutures over the right perihilar/paramediastinal region. Cardiomediastinal silhouette and remainder of the exam is unchanged.  IMPRESSION: No active disease.  Postsurgical change/volume loss of the right lung.   Electronically Signed   By: Marin Olp M.D.   On: 07/29/2014 10:37      Assessment & Plan  *No overt GI bleeding although hemoglobin has drifted down.*GI bleed probably secondary to nosebleed.*  Recommendations 1.  Continue to hold her alto 2.  Advance diet 3.  Hold EGD unless hemoglobin continues to decrease  Active Problems:   Paroxysmal atrial fibrillation   Non-small cell carcinoma of right lung, stage 4   Obstructive chronic bronchitis without exacerbation Gold A    Acute GI bleeding   GI bleed     LOS: 1 day   Erskine Emery  07/30/2014, 10:38 AM

## 2014-07-30 NOTE — Progress Notes (Signed)
[facility] PROGRESS NOTE  Nancy Hodges VZC:[id number] DOB: 10-02-1945 DOA: 07/29/2014 PCP: No PCP Per Patient  Assessment/Plan:  Malena since 2 days.  Admitted the patient for evaluation of GI bleed.  She denies the use of NSAIDS. Advanced diet. Brown stool today .  IV PPI. Last EGD and colonoscopy was in June 2015. CT abdomen and pelvis today showed diverticulosis without diverticulitis Gastroenterology on board.  H&h every 8 hours.  Drop in hemoglobin to 7.4 and 2 units of prbc transfusion ordered.    Upper respiratory infection with bronchitis.  Nasal decongestant and levaquin for bronchitis. Wills top levaquin after tomorrow's dose.    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    Paroxysmal afib on xarelto: Rate controlled. Holding xarelto.    Mild Asthma exacerbation: Improved with neb treatments.   Code Status: full code.  Family Communication: none at bedside Disposition Plan:pending.    Consultants: gastroenterology Procedures:  2units prbc transfusion.   Antibiotics:  none  HPI/Subjective: Abdominal pain improved.   Objective: Filed Vitals:   07/30/14 1700  BP: 134/47  Pulse: 84  Temp: 98.5 F (36.9 C)  Resp: 16    Intake/Output Summary (Last 24 hours) at 07/30/14 1730 Last data filed at 07/30/14 1700  Gross per 24 hour  Intake   1510 ml  Output      0 ml  Net   1510 ml   Filed Weights   07/29/14 1529  Weight: 63.594 kg (140 lb 3.2 oz)    Exam:   General:  Alert afebrile comfortable  Cardiovascular: s1s2  Respiratory: ctab  Abdomen: soft non tender non distended bowel sounds heard  Musculoskeletal: no pedal edema.   Data Reviewed: Basic Metabolic Panel:  Recent Labs Lab 07/26/14 0917 07/29/14 0936 07/29/14 0947 07/30/14 0508  NA 139 135 139 137  K 4.2 4.3 4.3 3.8  CL 107 104 105 107  CO2 26 23  --  25  GLUCOSE 148* 113* 113* 88  BUN 9 13 11 7   CREATININE  0.51 0.61 0.70 0.51  CALCIUM 9.1 9.4  --  8.7  MG  --  1.8  --   --    Liver Function Tests:  Recent Labs Lab 07/26/14 0917 07/29/14 0936  AST 38* 38*  ALT 27 24  ALKPHOS 108 123*  BILITOT 0.9 0.9  PROT 7.0 7.4  ALBUMIN 3.7 4.1    Recent Labs Lab 07/29/14 0936  LIPASE 48   No results for input(s): AMMONIA in the last 168 hours. CBC:  Recent Labs Lab 07/26/14 0917 07/29/14 0936 07/29/14 0947 07/29/14 1556 07/29/14 2315 07/30/14 0508  WBC 3.6* 3.9*  --   --   --   --   NEUTROABS 2.3 2.6  --   --   --   --   HGB 7.8* 8.7* 10.2* 8.3* 7.7* 7.4*  HCT 24.3* 26.8* 30.0* 25.5* 24.4* 23.6*  MCV 89.3 89.3  --   --   --   --   PLT 57* 154  --   --   --   --    Cardiac Enzymes: No results for input(s): CKTOTAL, CKMB, CKMBINDEX, TROPONINI in the last 168 hours. BNP (last 3 results) No results for input(s): PROBNP in the last 8760 hours. CBG: No results for input(s): GLUCAP in the last 168 hours.  Recent Results (from the past 240 hour(s))  Urine culture     Status: None   Collection Time:  07/22/14  2:22 PM  Result Value Ref Range Status   Specimen Description URINE, CLEAN CATCH  Final   Special Requests NONE  Final   Culture  Setup Time   Final    07/22/2014 20:18 Performed at [facility]   Final    9,000 COLONIES/ML Performed at [facility]    Culture   Final    INSIGNIFICANT GROWTH Performed at [facility]    Report Status 07/23/2014 FINAL  Final  Culture, blood (routine x 2)     Status: None   Collection Time: 07/22/14  2:35 PM  Result Value Ref Range Status   Specimen Description BLOOD LEFT HAND  Final   Special Requests BOTTLES DRAWN AEROBIC AND ANAEROBIC [facility] EACH  Final   Culture  Setup Time   Final    07/22/2014 18:59 Performed at [facility]    Culture   Final    NO GROWTH 5 DAYS Performed at [facility]    Report Status 07/28/2014 FINAL  Final  Culture, blood (routine x 2)      Status: None   Collection Time: 07/22/14  3:12 PM  Result Value Ref Range Status   Specimen Description BLOOD LEFT ARM  Final   Special Requests   Final    BOTTLES DRAWN AEROBIC AND ANAEROBIC 5CC BOTH BOTTLES   Culture  Setup Time   Final    07/22/2014 18:59 Performed at [facility]    Culture   Final    NO GROWTH 5 DAYS Performed at [facility]    Report Status 07/28/2014 FINAL  Final     Studies: Ct Abdomen Pelvis W Contrast  07/29/2014   CLINICAL DATA:  Onset of lower abdominal cramping during blood transfusion today, dark stools, pressure feeling to have bowel movement, productive cough, history asthma, atrial fibrillation, lung cancer, GERD  EXAM: CT ABDOMEN AND PELVIS WITH CONTRAST  TECHNIQUE: Multidetector CT imaging of the abdomen and pelvis was performed using the standard protocol following bolus administration of intravenous contrast. Sagittal and coronal MPR images reconstructed from axial data set.  CONTRAST:  42mL OMNIPAQUE IOHEXOL 300 MG/ML SOLN PO, 13mL OMNIPAQUE IOHEXOL 300 MG/ML SOLN IV  COMPARISON:  PET-CT 05/17/2014  FINDINGS: Scarring at anterior and lateral RIGHT lung base.  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  Calcified gallstones in gallbladder.  Spleen minimally prominent in size.  Liver, spleen, pancreas, kidneys, and adrenal glands otherwise grossly normal.  Scattered atherosclerotic calcifications.  Multiple normal and upper normal sized periportal lymph nodes decreased in size is since prior PET-CT.  Normal appendix.  Sigmoid diverticulosis without evidence of diverticulitis.  Stomach and remaining bowel loops normal appearance.  Colon incompletely distended, no additional gross abnormality seen.  Stomach and small bowel loops normal appearance.  Uterus surgically absent with unremarkable bladder, ureters, and ovaries.  No mass, adenopathy, free fluid, free air, inflammatory process, hernia, or acute bone lesion.  IMPRESSION:  Cholelithiasis.  Sigmoid diverticulosis without evidence of diverticulitis.  Decreased periportal adenopathy since prior PET-CT.  No acute intra-abdominal or intrapelvic abnormalities.   Electronically Signed   By: Lavonia Dana M.D.   On: 07/29/2014 11:47   Dg Chest Portable 1 View  07/29/2014   CLINICAL DATA:  Hemoptysis post blood transfusion and cancer center today. Lung cancer.  EXAM: PORTABLE CHEST - 1 VIEW  COMPARISON:  07/22/2014  FINDINGS: Patient is slightly rotated to the right. Lungs are adequately inflated with  postsurgical volume loss of the right lung unchanged. Mild stable blunting of the right costophrenic angle. Surgical sutures over the right perihilar/paramediastinal region. Cardiomediastinal silhouette and remainder of the exam is unchanged.  IMPRESSION: No active disease.  Postsurgical change/volume loss of the right lung.   Electronically Signed   By: Marin Olp M.D.   On: 07/29/2014 10:37    Scheduled Meds: . amiodarone  100 mg Oral Daily  . antiseptic oral rinse  7 mL Mouth Rinse BID  . diltiazem  240 mg Oral Daily  . fexofenadine  180 mg Oral Daily  . folic acid  1 mg Oral Daily  . ipratropium-albuterol  3 mL Nebulization QID  . levofloxacin (LEVAQUIN) IV  500 mg Intravenous Q24H  . pantoprazole (PROTONIX) IV  40 mg Intravenous Q24H   Continuous Infusions:   Active Problems:   Paroxysmal atrial fibrillation   Non-small cell carcinoma of right lung, stage 4   Obstructive chronic bronchitis without exacerbation Gold A    Acute GI bleeding   GI bleed    Time spent: 25 min    [facility] [place] [facility] Pager [phone]. If 7PM-7AM, please contact night-coverage at [email or web address], password [facility] 07/30/2014, 5:30 PM  LOS: 1 day

## 2014-07-31 DIAGNOSIS — D6481 Anemia due to antineoplastic chemotherapy: Secondary | ICD-10-CM

## 2014-07-31 DIAGNOSIS — C3491 Malignant neoplasm of unspecified part of right bronchus or lung: Secondary | ICD-10-CM

## 2014-07-31 DIAGNOSIS — K921 Melena: Secondary | ICD-10-CM | POA: Insufficient documentation

## 2014-07-31 DIAGNOSIS — C3411 Malignant neoplasm of upper lobe, right bronchus or lung: Secondary | ICD-10-CM

## 2014-07-31 DIAGNOSIS — R109 Unspecified abdominal pain: Secondary | ICD-10-CM

## 2014-07-31 DIAGNOSIS — C773 Secondary and unspecified malignant neoplasm of axilla and upper limb lymph nodes: Secondary | ICD-10-CM

## 2014-07-31 LAB — TYPE AND SCREEN
ABO/RH(D): O POS
Antibody Screen: NEGATIVE
Unit division: 0
Unit division: 0

## 2014-07-31 LAB — CBC
HCT: 34.6 % — ABNORMAL LOW (ref 36.0–46.0)
HEMOGLOBIN: 11.2 g/dL — AB (ref 12.0–15.0)
MCH: 27.9 pg (ref 26.0–34.0)
MCHC: 32.4 g/dL (ref 30.0–36.0)
MCV: 86.1 fL (ref 78.0–100.0)
Platelets: 176 10*3/uL (ref 150–400)
RBC: 4.02 MIL/uL (ref 3.87–5.11)
RDW: 21 % — ABNORMAL HIGH (ref 11.5–15.5)
WBC: 3 10*3/uL — ABNORMAL LOW (ref 4.0–10.5)

## 2014-07-31 LAB — HEMOGLOBIN AND HEMATOCRIT, BLOOD
HEMATOCRIT: 32.2 % — AB (ref 36.0–46.0)
HEMOGLOBIN: 10.4 g/dL — AB (ref 12.0–15.0)

## 2014-07-31 MED ORDER — RIVAROXABAN 20 MG PO TABS
20.0000 mg | ORAL_TABLET | Freq: Every day | ORAL | Status: DC
  Administered 2014-07-31 – 2014-08-01 (×2): 20 mg via ORAL
  Filled 2014-07-31 (×2): qty 1

## 2014-07-31 MED ORDER — PANTOPRAZOLE SODIUM 40 MG PO TBEC
40.0000 mg | DELAYED_RELEASE_TABLET | Freq: Every day | ORAL | Status: DC
  Administered 2014-08-01: 40 mg via ORAL
  Filled 2014-07-31: qty 1

## 2014-07-31 MED ORDER — ACETAMINOPHEN 325 MG PO TABS
650.0000 mg | ORAL_TABLET | ORAL | Status: DC | PRN
Start: 2014-07-31 — End: 2014-08-01

## 2014-07-31 MED ORDER — LEVOFLOXACIN 500 MG PO TABS
500.0000 mg | ORAL_TABLET | Freq: Once | ORAL | Status: AC
  Administered 2014-07-31: 500 mg via ORAL
  Filled 2014-07-31: qty 1

## 2014-07-31 NOTE — Progress Notes (Signed)
Progress Note   Subjective  feels okay. Stools dark but not black now   Objective   Vital signs in last 24 hours: Temp:  [98 F (36.7 C)-98.5 F (36.9 C)] 98.2 F (36.8 C) (12/28 0616) Pulse Rate:  [72-89] 72 (12/28 0616) Resp:  [16-20] 20 (12/28 0616) BP: (118-151)/(43-60) 119/44 mmHg (12/28 0616) SpO2:  [94 %-98 %] 95 % (12/28 0803) Last BM Date: 07/30/14 General:    white female in NAD Abdomen:  Soft, nontender and nondistended. Normal bowel sounds. Extremities:  Without edema. Neurologic:  Alert and oriented,  grossly normal neurologically. Psych:  Cooperative. Normal mood and affect.    Lab Results:  Recent Labs  07/29/14 0936  07/30/14 0508 07/30/14 1907 07/31/14 0403  WBC 3.9*  --   --   --  3.0*  HGB 8.7*  < > 7.4* 10.4* 11.2*  HCT 26.8*  < > 23.6* 32.2* 34.6*  PLT 154  --   --   --  176  < > = values in this interval not displayed. BMET  Recent Labs  07/29/14 0936 07/29/14 0947 07/30/14 0508  NA 135 139 137  K 4.3 4.3 3.8  CL 104 105 107  CO2 23  --  25  GLUCOSE 113* 113* 88  BUN 13 11 7   CREATININE 0.61 0.70 0.51  CALCIUM 9.4  --  8.7   LFT  Recent Labs  07/29/14 0936  PROT 7.4  ALBUMIN 4.1  AST 38*  ALT 24  ALKPHOS 123*  BILITOT 0.9   PT/INR  Recent Labs  07/29/14 0936  LABPROT 21.9*  INR 1.89*    Studies/Results: Ct Abdomen Pelvis W Contrast  07/29/2014   CLINICAL DATA:  Onset of lower abdominal cramping during blood transfusion today, dark stools, pressure feeling to have bowel movement, productive cough, history asthma, atrial fibrillation, lung cancer, GERD  EXAM: CT ABDOMEN AND PELVIS WITH CONTRAST  TECHNIQUE: Multidetector CT imaging of the abdomen and pelvis was performed using the standard protocol following bolus administration of intravenous contrast. Sagittal and coronal MPR images reconstructed from axial data set.  CONTRAST:  15mL OMNIPAQUE IOHEXOL 300 MG/ML SOLN PO, 180mL OMNIPAQUE IOHEXOL 300 MG/ML SOLN IV   COMPARISON:  PET-CT 05/17/2014  FINDINGS: Scarring at anterior and lateral RIGHT lung base.  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  Calcified gallstones in gallbladder.  Spleen minimally prominent in size.  Liver, spleen, pancreas, kidneys, and adrenal glands otherwise grossly normal.  Scattered atherosclerotic calcifications.  Multiple normal and upper normal sized periportal lymph nodes decreased in size is since prior PET-CT.  Normal appendix.  Sigmoid diverticulosis without evidence of diverticulitis.  Stomach and remaining bowel loops normal appearance.  Colon incompletely distended, no additional gross abnormality seen.  Stomach and small bowel loops normal appearance.  Uterus surgically absent with unremarkable bladder, ureters, and ovaries.  No mass, adenopathy, free fluid, free air, inflammatory process, hernia, or acute bone lesion.  IMPRESSION: Cholelithiasis.  Sigmoid diverticulosis without evidence of diverticulitis.  Decreased periportal adenopathy since prior PET-CT.  No acute intra-abdominal or intrapelvic abnormalities.   Electronically Signed   By: Lavonia Dana M.D.   On: 07/29/2014 11:47     Assessment / Plan:    1. Acute on chronic anemia / black heme positive stools in setting of Xarelto. .  Unrevealing EGD/colonoscopy in [place] in July per patient (procedures done for overt bleeding in form of black stools). She reportedly had a large nosebleed a few days back and  there is question of whether this contributed to blood in stool /worsening anemia.  Continue PPI. Exact cause of bleeding undetermined for sure and she is at risk for recurrent bleeding on Xarelto.  2. Acute on chronic anemia / black, heme positive stools. Hgb this admission fell to 7.4 (about 2 units below her baseline of mid 9). She had a better than expected response to 2 units of blood. Hgb rose to 11.2 today.  3. Lung cancer. Patient scheduled for chest CTscan to be done tomorrow. She would like to see if it  can be done inpatient to prevent another trip from [place], [place] to [place]. Will defer to admitting team     LOS: 2 days   Tye Savoy  07/31/2014, 9:25 AM  [facility] Attending  I have also seen and assessed the patient and agree with the above note.  No more signs of bleeding. Seems like epistaxis and chemoTx caused anemia. Would resume Xarelto  Will see as needed - signing off.  Gatha Mayer, MD, [facility] [phone] (pager) 07/31/2014 3:01 PM

## 2014-07-31 NOTE — Progress Notes (Signed)
[facility] PROGRESS NOTE  Nancy Hodges EGB:[id number] DOB: Jul 30, 1946 DOA: 07/29/2014 PCP: No PCP Per Patient  Assessment/Plan:  Malena since 2 days.  Admitted the patient for evaluation of GI bleed.  She denies the use of NSAIDS. Advanced diet. Brown stool today .  IV PPI since admission, plan to change to oral. Last EGD and colonoscopy was in June 2015. CT abdomen and pelvis today showed diverticulosis without diverticulitis Gastroenterology on board.  Drop in hemoglobin to 7.4 and 2 units of prbc transfusion ordered. Repeat H& h much better. Resume xarelto and watch for bleeding.    Upper respiratory infection with bronchitis.  Nasal decongestant and levaquin for bronchitis. Improving.    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 Repeat CT ordered for tomorrow.   Paroxysmal afib on xarelto: Rate controlled. Resume  xarelto and watch for bleeding.    Mild Asthma exacerbation: Improved with neb treatments.   Code Status: full code.  Family Communication: none at bedside Disposition Plan:pending.    Consultants: gastroenterology Procedures:  2units prbc transfusion.   Antibiotics:  none  HPI/Subjective: Abdominal pain improved.   Objective: Filed Vitals:   07/31/14 1347  BP: 137/51  Pulse: 81  Temp: 98.3 F (36.8 C)  Resp: 18    Intake/Output Summary (Last 24 hours) at 07/31/14 1520 Last data filed at 07/31/14 0900  Gross per 24 hour  Intake    930 ml  Output      0 ml  Net    930 ml   Filed Weights   07/29/14 1529  Weight: 63.594 kg (140 lb 3.2 oz)    Exam:   General:  Alert afebrile comfortable  Cardiovascular: s1s2  Respiratory: ctab  Abdomen: soft non tender non distended bowel sounds heard  Musculoskeletal: no pedal edema.   Data Reviewed: Basic Metabolic Panel:  Recent Labs Lab 07/26/14 0917 07/29/14 0936 07/29/14 0947 07/30/14 0508  NA 139 135 139 137  K 4.2 4.3  4.3 3.8  CL 107 104 105 107  CO2 26 23  --  25  GLUCOSE 148* 113* 113* 88  BUN 9 13 11 7   CREATININE 0.51 0.61 0.70 0.51  CALCIUM 9.1 9.4  --  8.7  MG  --  1.8  --   --    Liver Function Tests:  Recent Labs Lab 07/26/14 0917 07/29/14 0936  AST 38* 38*  ALT 27 24  ALKPHOS 108 123*  BILITOT 0.9 0.9  PROT 7.0 7.4  ALBUMIN 3.7 4.1    Recent Labs Lab 07/29/14 0936  LIPASE 48   No results for input(s): AMMONIA in the last 168 hours. CBC:  Recent Labs Lab 07/26/14 0917 07/29/14 0936  07/29/14 1556 07/29/14 2315 07/30/14 0508 07/30/14 1907 07/31/14 0403  WBC 3.6* 3.9*  --   --   --   --   --  3.0*  NEUTROABS 2.3 2.6  --   --   --   --   --   --   HGB 7.8* 8.7*  < > 8.3* 7.7* 7.4* 10.4* 11.2*  HCT 24.3* 26.8*  < > 25.5* 24.4* 23.6* 32.2* 34.6*  MCV 89.3 89.3  --   --   --   --   --  86.1  PLT 57* 154  --   --   --   --   --  176  < > = values in this interval not displayed. Cardiac Enzymes: No results for  input(s): CKTOTAL, CKMB, CKMBINDEX, TROPONINI in the last 168 hours. BNP (last 3 results) No results for input(s): PROBNP in the last 8760 hours. CBG: No results for input(s): GLUCAP in the last 168 hours.  Recent Results (from the past 240 hour(s))  Urine culture     Status: None   Collection Time: 07/22/14  2:22 PM  Result Value Ref Range Status   Specimen Description URINE, CLEAN CATCH  Final   Special Requests NONE  Final   Culture  Setup Time   Final    07/22/2014 20:18 Performed at [place]   Final    9,000 COLONIES/ML Performed at [facility]    Culture   Final    INSIGNIFICANT GROWTH Performed at [facility]    Report Status 07/23/2014 FINAL  Final  Culture, blood (routine x 2)     Status: None   Collection Time: 07/22/14  2:35 PM  Result Value Ref Range Status   Specimen Description BLOOD LEFT HAND  Final   Special Requests BOTTLES DRAWN AEROBIC AND ANAEROBIC [facility] EACH  Final   Culture  Setup  Time   Final    07/22/2014 18:59 Performed at [facility]    Culture   Final    NO GROWTH 5 DAYS Performed at [facility]    Report Status 07/28/2014 FINAL  Final  Culture, blood (routine x 2)     Status: None   Collection Time: 07/22/14  3:12 PM  Result Value Ref Range Status   Specimen Description BLOOD LEFT ARM  Final   Special Requests   Final    BOTTLES DRAWN AEROBIC AND ANAEROBIC 5CC BOTH BOTTLES   Culture  Setup Time   Final    07/22/2014 18:59 Performed at [place]   Final    NO GROWTH 5 DAYS Performed at [facility]    Report Status 07/28/2014 FINAL  Final     Studies: No results found.  Scheduled Meds: . amiodarone  100 mg Oral Daily  . antiseptic oral rinse  7 mL Mouth Rinse BID  . diltiazem  240 mg Oral Daily  . fexofenadine  180 mg Oral Daily  . folic acid  1 mg Oral Daily  . ipratropium-albuterol  3 mL Nebulization QID  . levofloxacin  500 mg Oral Once  . pantoprazole (PROTONIX) IV  40 mg Intravenous Q24H  . rivaroxaban  20 mg Oral Q supper   Continuous Infusions:   Active Problems:   Paroxysmal atrial fibrillation   Non-small cell carcinoma of right lung, stage 4   Obstructive chronic bronchitis without exacerbation Gold A    Acute GI bleeding   GI bleed   Melena    Time spent: 25 min    [place] [facility] Pager [phone]. If 7PM-7AM, please contact night-coverage at [email or web address], password [facility] 07/31/2014, 3:20 PM  LOS: 2 days

## 2014-07-31 NOTE — Progress Notes (Signed)
DIAGNOSIS: Metastatic non-small cell lung cancer, adenocarcinoma. This was initially diagnosed as stage IB (T2a, N0, M0) In March of 2014, status post right upper lobectomy with lymph node dissection but the patient has evidence for disease recurrence in the right axilla status post resection.  PRIOR THERAPY: Status post right axillary lymph node biopsy on 05/01/2014 under the care of Dr. Roxan Hockey  CURRENT THERAPY: Systemic chemotherapy with carboplatin for AUC of 5 and Alimta 500 mg/M2 every 3 weeks. First cycle 05/31/2014. Status post 3 cycles.  Subjective: The patient is seen and examined today. She is feeling fine with no specific complaints except for mild neck pain as well as sinus pain. She denied having any significant fever or chills, no nausea or vomiting, no abdominal pain. She was admitted recently complaining of abdominal pain and CT scan of the abdomen and pelvis showed cholelithiasis well as sigmoid diverticulosis. There was no acute intra-abdominal or intrapelvic abnormalities. She also received 2 units of PRBCs transfusion during this admission.  Objective: Vital signs in last 24 hours: Temp:  [98 F (36.7 C)-98.5 F (36.9 C)] 98.2 F (36.8 C) (12/28 0616) Pulse Rate:  [72-89] 72 (12/28 0616) Resp:  [16-20] 20 (12/28 0616) BP: (118-151)/(43-60) 119/44 mmHg (12/28 0616) SpO2:  [94 %-98 %] 95 % (12/28 0803)  Intake/Output from previous day: 12/27 0701 - 12/28 0700 In: 1870 [P.O.:960; I.V.:250; Blood:660] Out: -  Intake/Output this shift:    General appearance: alert, cooperative and no distress Resp: clear to auscultation bilaterally Cardio: regular rate and rhythm, S1, S2 normal, no murmur, click, rub or gallop GI: soft, non-tender; bowel sounds normal; no masses,  no organomegaly Extremities: extremities normal, atraumatic, no cyanosis or edema  Lab Results:   Recent Labs  07/29/14 0936  07/30/14 1907 07/31/14 0403  WBC 3.9*  --   --  3.0*  HGB 8.7*  < >  10.4* 11.2*  HCT 26.8*  < > 32.2* 34.6*  PLT 154  --   --  176  < > = values in this interval not displayed. BMET  Recent Labs  07/29/14 0936 07/29/14 0947 07/30/14 0508  NA 135 139 137  K 4.3 4.3 3.8  CL 104 105 107  CO2 23  --  25  GLUCOSE 113* 113* 88  BUN 13 11 7   CREATININE 0.61 0.70 0.51  CALCIUM 9.4  --  8.7    Studies/Results: Ct Abdomen Pelvis W Contrast  07/29/2014   CLINICAL DATA:  Onset of lower abdominal cramping during blood transfusion today, dark stools, pressure feeling to have bowel movement, productive cough, history asthma, atrial fibrillation, lung cancer, GERD  EXAM: CT ABDOMEN AND PELVIS WITH CONTRAST  TECHNIQUE: Multidetector CT imaging of the abdomen and pelvis was performed using the standard protocol following bolus administration of intravenous contrast. Sagittal and coronal MPR images reconstructed from axial data set.  CONTRAST:  45mL OMNIPAQUE IOHEXOL 300 MG/ML SOLN PO, 126mL OMNIPAQUE IOHEXOL 300 MG/ML SOLN IV  COMPARISON:  PET-CT 05/17/2014  FINDINGS: Scarring at anterior and lateral RIGHT lung base.  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  Calcified gallstones in gallbladder.  Spleen minimally prominent in size.  Liver, spleen, pancreas, kidneys, and adrenal glands otherwise grossly normal.  Scattered atherosclerotic calcifications.  Multiple normal and upper normal sized periportal lymph nodes decreased in size is since prior PET-CT.  Normal appendix.  Sigmoid diverticulosis without evidence of diverticulitis.  Stomach and remaining bowel loops normal appearance.  Colon incompletely distended, no additional gross abnormality seen.  Stomach and small bowel loops normal appearance.  Uterus surgically absent with unremarkable bladder, ureters, and ovaries.  No mass, adenopathy, free fluid, free air, inflammatory process, hernia, or acute bone lesion.  IMPRESSION: Cholelithiasis.  Sigmoid diverticulosis without evidence of diverticulitis.  Decreased  periportal adenopathy since prior PET-CT.  No acute intra-abdominal or intrapelvic abnormalities.   Electronically Signed   By: Lavonia Dana M.D.   On: 07/29/2014 11:47   Dg Chest Portable 1 View  07/29/2014   CLINICAL DATA:  Hemoptysis post blood transfusion and cancer center today. Lung cancer.  EXAM: PORTABLE CHEST - 1 VIEW  COMPARISON:  07/22/2014  FINDINGS: Patient is slightly rotated to the right. Lungs are adequately inflated with postsurgical volume loss of the right lung unchanged. Mild stable blunting of the right costophrenic angle. Surgical sutures over the right perihilar/paramediastinal region. Cardiomediastinal silhouette and remainder of the exam is unchanged.  IMPRESSION: No active disease.  Postsurgical change/volume loss of the right lung.   Electronically Signed   By: Marin Olp M.D.   On: 07/29/2014 10:37    Medications: I have reviewed the patient's current medications.   Assessment/Plan: 1) metastatic non-small cell lung cancer, undergoing systemic chemotherapy with carboplatin and Alimta status post 3 cycles. The patient is scheduled for repeat CT scan of the chest tomorrow. She is followed in she would like to have her CT scan of the chest performed before her discharge which is reasonable in her condition. She has a follow-up appointment with me later this week for evaluation before proceeding with cycle #4. 2) abdominal pain most likely secondary to cholelithiasis and diverticulosis. She is feeling much better. 3) chemotherapy-induced anemia: Improved after PRBCs transfusion. 4) disposition: The patient feels good and she may be ready for discharge after CT scan of the chest. Thank you so much for taking good care of Ms. Gleaves, I will continue to follow up the patient with you and assist in her management on as-needed basis.   LOS: 2 days    Kayleigh Broadwell K. 07/31/2014

## 2014-07-31 NOTE — Progress Notes (Signed)
ANTICOAGULATION CONSULT NOTE - Initial Consult  Pharmacy Consult for Xarelto Indication: Paroxysmal Atrial Fibrillation  Allergies  Allergen Reactions  . Prednisone Shortness Of Breath and Other (See Comments)    REACTION: double vision  . Septra [Sulfamethoxazole-Trimethoprim] Shortness Of Breath  . Penicillins Swelling and Rash  . Vioxx [Rofecoxib] Other (See Comments)    REACTION:  Stomach cramps    Patient Measurements: Height: 5\' 2"  (157.5 cm) Weight: 140 lb 3.2 oz (63.594 kg) IBW/kg (Calculated) : 50.1 Heparin Dosing Weight:   Vital Signs: Temp: 98.3 F (36.8 C) (12/28 1347) Temp Source: Oral (12/28 1347) BP: 137/51 mmHg (12/28 1347) Pulse Rate: 81 (12/28 1347)  Labs:  Recent Labs  07/29/14 0936 07/29/14 0947  07/30/14 0508 07/30/14 1907 07/31/14 0403  HGB 8.7* 10.2*  < > 7.4* 10.4* 11.2*  HCT 26.8* 30.0*  < > 23.6* 32.2* 34.6*  PLT 154  --   --   --   --  176  LABPROT 21.9*  --   --   --   --   --   INR 1.89*  --   --   --   --   --   CREATININE 0.61 0.70  --  0.51  --   --   < > = values in this interval not displayed.  Estimated Creatinine Clearance: 57.3 mL/min (by C-G formula based on Cr of 0.51).   Medical History: Past Medical History  Diagnosis Date  . Asthma   . History of sinusitis   . Allergic rhinitis   . History of kidney stones   . 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 H/O hiatal hernia   . GERD (gastroesophageal reflux disease)   . Pulmonary nodule   . Paroxysmal atrial fibrillation     CHADS score: 0  . Atrial flutter   . Tachycardia-bradycardia syndrome     a. 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  [name] Anxiety   . Headache(784.0)     sinus  . Lung cancer     a. Stage IB non-small cell carcinoma, s/p R VATS, wedge resection, RU lobectomy 10/2012.  . Diverticulosis   . Internal hemorrhoid   . Dysrhythmia   . Pneumonia June 2014  . Arthritis   . Thin skin   . Bruises easily     patient on Xarelto    Assessment: 29 yoF on chronic anticoagulation for PAF, also with metastatic NSCLC undergoing chemotherapy and acute on chronic anemia admitted with black, heme positive stools and low hemoglobin.  Xarelto held initiallly and now GI ok with resuming as no further s/sxs bleeding and likely 2/2 chemotherapy.  Pharmacy consulted to resume xarelto.  Last dose xarelto 12/25.    12/28: Hgb improved to 11.2.  Platelets WNL.  No issues noted with renal function.    Goal of Therapy:   Monitor platelets by anticoagulation protocol: Yes   Plan:  Resume home dose xarelto 20mg  po qday.  Monitor CBC, s/sxs bleeding.   Ralene Bathe, PharmD, BCPS 07/31/2014, 3:10 PM  Pager: [phone]

## 2014-08-01 ENCOUNTER — Inpatient Hospital Stay (HOSPITAL_COMMUNITY): Payer: Medicare Other

## 2014-08-01 ENCOUNTER — Encounter (HOSPITAL_COMMUNITY): Payer: Self-pay | Admitting: Radiology

## 2014-08-01 ENCOUNTER — Ambulatory Visit (HOSPITAL_COMMUNITY): Payer: Medicare Other

## 2014-08-01 ENCOUNTER — Other Ambulatory Visit: Payer: Self-pay | Admitting: *Deleted

## 2014-08-01 DIAGNOSIS — I480 Paroxysmal atrial fibrillation: Secondary | ICD-10-CM

## 2014-08-01 LAB — CBC
HEMATOCRIT: 33.3 % — AB (ref 36.0–46.0)
HEMOGLOBIN: 10.5 g/dL — AB (ref 12.0–15.0)
MCH: 27.5 pg (ref 26.0–34.0)
MCHC: 31.5 g/dL (ref 30.0–36.0)
MCV: 87.2 fL (ref 78.0–100.0)
Platelets: 208 10*3/uL (ref 150–400)
RBC: 3.82 MIL/uL — AB (ref 3.87–5.11)
RDW: 21.1 % — ABNORMAL HIGH (ref 11.5–15.5)
WBC: 2.8 10*3/uL — AB (ref 4.0–10.5)

## 2014-08-01 LAB — BASIC METABOLIC PANEL
Anion gap: 5 (ref 5–15)
BUN: 9 mg/dL (ref 6–23)
CHLORIDE: 108 meq/L (ref 96–112)
CO2: 26 mmol/L (ref 19–32)
Calcium: 8.8 mg/dL (ref 8.4–10.5)
Creatinine, Ser: 0.54 mg/dL (ref 0.50–1.10)
GFR calc Af Amer: >90 mL/min (ref >90)
GFR calc non Af Amer: >90 mL/min (ref >90)
Glucose, Bld: 106 mg/dL — ABNORMAL HIGH (ref 70–99)
Potassium: 4 mmol/L (ref 3.5–5.1)
Sodium: 139 mmol/L (ref 135–145)

## 2014-08-01 MED ORDER — IPRATROPIUM-ALBUTEROL 0.5-2.5 (3) MG/3ML IN SOLN: 3.0000 mL | Freq: Three times a day (TID) | RESPIRATORY_TRACT | Status: DC

## 2014-08-01 MED ORDER — METHOCARBAMOL 1000 MG/10ML IJ SOLN
500.0000 mg | Freq: Once | INTRAVENOUS | Status: AC
  Administered 2014-08-01: 500 mg via INTRAVENOUS
  Filled 2014-08-01: qty 5

## 2014-08-01 MED ORDER — IPRATROPIUM-ALBUTEROL 0.5-2.5 (3) MG/3ML IN SOLN: 3.0000 mL | Freq: Four times a day (QID) | RESPIRATORY_TRACT | Status: DC | PRN

## 2014-08-01 MED ORDER — IOHEXOL 300 MG/ML  SOLN
100.0000 mL | Freq: Once | INTRAMUSCULAR | Status: AC | PRN
  Administered 2014-08-01: 100 mL via INTRAVENOUS

## 2014-08-01 NOTE — Discharge Summary (Signed)
Physician Discharge Summary  Nancy Hodges WVP:[id number] DOB: [birth year] DOA: 07/29/2014  PCP: No PCP Per Patient  Admit date: 07/29/2014 Discharge date: 08/01/2014  Time spent: 30 minutes  Recommendations for Outpatient Follow-up:  1. Follow up with oncology as recommended.   Discharge Diagnoses:  Active Problems:   Paroxysmal atrial fibrillation   Non-small cell carcinoma of right lung, stage 4   Obstructive chronic bronchitis without exacerbation Gold A    Acute GI bleeding   GI bleed   Melena   Discharge Condition: improved.   Diet recommendation: regular diet  Filed Weights   07/29/14 1529  Weight: 63.594 kg (140 lb 3.2 oz)    History of present illness:  Nancy Hodges is a 68 y.o. female with prior h/o paroxysmal atrial fibrillation on xarelto, metastatic non small cell lung cancer s/p resection and chemotherapy, with last chemo on 12/9, hypertension, diverticulosis, hemorrhoids, comes in to ED for prbc transfusion for a hemoglobin of 7 on 12/24. She was admitted to hospitalist service for questionable GI bleed.  Hospital Course:  [facility]  Admitted the patient for evaluation of GI bleed.  She denies the use of NSAIDS. Advanced diet. Brown stools. IV PPI changed to po protonix.  Last EGD and colonoscopy was in June 2015. CT abdomen and pelvis today showed diverticulosis without diverticulitis Gastroenterology on board.  Drop in hemoglobin to 7.4 and 2 units of prbc transfusion ordered. Repeat H& h much better. Resume xarelto and repeat hemoglobin is stable.    Upper respiratory infection with bronchitis.  Nasal decongestant and levaquin for bronchitis. Improved.    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    Paroxysmal afib on xarelto: Rate controlled. Resume xarelto and watch for bleeding.    Mild Asthma exacerbation: Improved with neb treatments.   Procedures:  CT chest with contrast.    Consultations: Oncology gastroenteroloy  Discharge Exam: Filed Vitals:   08/01/14 1331  BP: 128/52  Pulse: 80  Temp: 98.7 F (37.1 C)  Resp:     General: alert afebrile comfortable Cardiovascular: s1s2 Respiratory: ctab  Discharge Instructions   Discharge Instructions    Diet - low sodium heart healthy    Complete by:  As directed      Discharge instructions    Complete by:  As directed   Follow up with Dr Julien Nordmann as recommended.  Follow up with CBC in one week.          Current Discharge Medication List    START taking these medications   Details  ipratropium-albuterol (DUONEB) 0.5-2.5 (3) MG/3ML SOLN Take 3 mLs by nebulization every 6 (six) hours as needed. Qty: 360 mL, Refills: 1      CONTINUE these medications which have NOT CHANGED   Details  acetaminophen (TYLENOL) 500 MG tablet Take 500 mg by mouth every 6 (six) hours as needed for moderate pain or fever.     albuterol (PROVENTIL HFA;VENTOLIN HFA) 108 (90 BASE) MCG/ACT inhaler Inhale 1-2 puffs into the lungs every 6 (six) hours as needed for wheezing or shortness of breath.    albuterol (PROVENTIL) (2.5 MG/3ML) 0.083% nebulizer solution Take 3 mLs (2.5 mg total) by nebulization every 2 (two) hours as needed for shortness of breath. Qty: 120 mL, Refills: 0    amiodarone (PACERONE) 100 MG tablet Take 1 tablet (100 mg total) by mouth daily. Qty: 30 tablet, Refills: 11    dexamethasone (DECADRON) 4 MG tablet 4 mg po bid  the day before, day of and day after chemo Qty: 40 tablet, Refills: 1    diltiazem (CARDIZEM CD) 240 MG 24 hr capsule Take 1 capsule (240 mg total) by mouth daily. Qty: 30 capsule, Refills: 10    fluticasone (FLONASE) 50 MCG/ACT nasal spray Place 2 sprays into the nose daily as needed (for congestion).    folic acid (FOLVITE) 1 MG tablet Take 1 tablet (1 mg total) by mouth daily. Qty: 30 tablet, Refills: 4    guaiFENesin-dextromethorphan (ROBITUSSIN DM) 100-10 MG/5ML syrup Take 5  mLs by mouth 3 (three) times daily as needed for cough. Qty: 118 mL, Refills: 0    PRESCRIPTION MEDICATION Chemo - [facility]    prochlorperazine (COMPAZINE) 10 MG tablet Take 1 tablet (10 mg total) by mouth every 6 (six) hours as needed for nausea or vomiting. Qty: 30 tablet, Refills: 0    rivaroxaban (XARELTO) 20 MG TABS tablet Take 1 tablet (20 mg total) by mouth daily. Qty: 30 tablet, Refills: 11    traMADol (ULTRAM) 50 MG tablet Take 1-2 tablets (50-100 mg total) by mouth every 6 (six) hours as needed (pain). Qty: 30 tablet, Refills: 0      STOP taking these medications     doxycycline (VIBRAMYCIN) 100 MG capsule      fexofenadine (ALLEGRA) 180 MG tablet      guaiFENesin (MUCINEX) 600 MG 12 hr tablet      JANUVIA 50 MG tablet        Allergies  Allergen Reactions  . Prednisone Shortness Of Breath and Other (See Comments)    REACTION: double vision  . Septra [Sulfamethoxazole-Trimethoprim] Shortness Of Breath  . Penicillins Swelling and Rash  . Vioxx [Rofecoxib] Other (See Comments)    REACTION:  Stomach cramps      The results of significant diagnostics from this hospitalization (including imaging, microbiology, ancillary and laboratory) are listed below for reference.    Significant Diagnostic Studies: Ct Chest W Contrast  08/01/2014   CLINICAL DATA:  68 year old female with history of lung cancer, currently with cough, shortness of breath and possible upper respiratory tract infection  EXAM: CT CHEST WITH CONTRAST  TECHNIQUE: Multidetector CT imaging of the chest was performed during intravenous contrast administration.  CONTRAST:  151mL OMNIPAQUE IOHEXOL 300 MG/ML  SOLN  COMPARISON:  04/25/2014  FINDINGS: Heart/mediastinum: An electronic device in the region of the right ventricular apex likely represents an implanted pacemaker. The heart size is mildly enlarged. There is no pericardial effusion. A few scattered atherosclerotic aortic and coronary artery calcifications  are present. Mitral annular calcifications are also noted.  No pathologically enlarged mediastinal, hilar or axillary lymph nodes are identified. Surgical clips are noted within the right axilla.  Chest: Postsurgical changes from a prior right upper lobectomy are noted. A few areas of linear scarring are noted within the right middle lobe and along the pleural of the right lower lobe which are similar to prior. Areas of dependent atelectasis are noted in the lung bases. There is no suspicious pulmonary nodule or mass. Evidence of mild centrilobular emphysema is noted. There is no pleural effusion or pneumothorax.  Upper abdomen: The liver is diffusely low in attenuation, compatible with hepatic steatosis. The spleen is enlarged and measures up to 14.1 cm. A few dependent gallstones are present. The remaining partially visualized upper abdomen is grossly unremarkable.  Osseous structures: Multilevel degenerative changes of the spine are present. No acute osseous abnormality is identified.  IMPRESSION: 1. Postsurgical changes from prior right  upper lobectomy. 2. No definite evidence of disease recurrence or metastatic disease involving the chest. 3. Splenomegaly. 4. Mild centrilobular emphysema. 5. Atherosclerotic coronary artery disease. 6. Hepatic steatosis.   Electronically Signed   By: Rosemarie Ax   On: 08/01/2014 10:51   Ct Abdomen Pelvis W Contrast  07/29/2014   CLINICAL DATA:  Onset of lower abdominal cramping during blood transfusion today, dark stools, pressure feeling to have bowel movement, productive cough, history asthma, atrial fibrillation, lung cancer, GERD  EXAM: CT ABDOMEN AND PELVIS WITH CONTRAST  TECHNIQUE: Multidetector CT imaging of the abdomen and pelvis was performed using the standard protocol following bolus administration of intravenous contrast. Sagittal and coronal MPR images reconstructed from axial data set.  CONTRAST:  33mL OMNIPAQUE IOHEXOL 300 MG/ML SOLN PO, 118mL OMNIPAQUE  IOHEXOL 300 MG/ML SOLN IV  COMPARISON:  PET-CT 05/17/2014  FINDINGS: Scarring at anterior and lateral RIGHT lung base.  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  Calcified gallstones in gallbladder.  Spleen minimally prominent in size.  Liver, spleen, pancreas, kidneys, and adrenal glands otherwise grossly normal.  Scattered atherosclerotic calcifications.  Multiple normal and upper normal sized periportal lymph nodes decreased in size is since prior PET-CT.  Normal appendix.  Sigmoid diverticulosis without evidence of diverticulitis.  Stomach and remaining bowel loops normal appearance.  Colon incompletely distended, no additional gross abnormality seen.  Stomach and small bowel loops normal appearance.  Uterus surgically absent with unremarkable bladder, ureters, and ovaries.  No mass, adenopathy, free fluid, free air, inflammatory process, hernia, or acute bone lesion.  IMPRESSION: Cholelithiasis.  Sigmoid diverticulosis without evidence of diverticulitis.  Decreased periportal adenopathy since prior PET-CT.  No acute intra-abdominal or intrapelvic abnormalities.   Electronically Signed   By: Lavonia Dana M.D.   On: 07/29/2014 11:47   Dg Chest Portable 1 View  07/29/2014   CLINICAL DATA:  Hemoptysis post blood transfusion and cancer center today. Lung cancer.  EXAM: PORTABLE CHEST - 1 VIEW  COMPARISON:  07/22/2014  FINDINGS: Patient is slightly rotated to the right. Lungs are adequately inflated with postsurgical volume loss of the right lung unchanged. Mild stable blunting of the right costophrenic angle. Surgical sutures over the right perihilar/paramediastinal region. Cardiomediastinal silhouette and remainder of the exam is unchanged.  IMPRESSION: No active disease.  Postsurgical change/volume loss of the right lung.   Electronically Signed   By: Marin Olp M.D.   On: 07/29/2014 10:37   Dg Chest Port 1 View  07/22/2014   CLINICAL DATA:  Fever for 3 days, cough, congestion for 3-4 days.   EXAM: PORTABLE CHEST - 1 VIEW  COMPARISON:  PET-CT 05/17/2014  FINDINGS: There is bibasilar scarring. There is no focal consolidation, pleural effusion or pneumothorax. There is right lateral pleural thickening unchanged from the prior exam. Stable cardiomediastinal silhouette. No acute osseous abnormality. Surgical clips in the right axilla.  IMPRESSION: No acute cardiopulmonary disease.   Electronically Signed   By: Kathreen Devoid   On: 07/22/2014 14:42    Microbiology: No results found for this or any previous visit (from the past 240 hour(s)).   Labs: Basic Metabolic Panel:  Recent Labs Lab 07/26/14 0917 07/29/14 0936 07/29/14 0947 07/30/14 0508 08/01/14 0418  NA 139 135 139 137 139  K 4.2 4.3 4.3 3.8 4.0  CL 107 104 105 107 108  CO2 26 23  --  25 26  GLUCOSE 148* 113* 113* 88 106*  BUN 9 13 11 7 9   CREATININE 0.51 0.61 0.70  0.51 0.54  CALCIUM 9.1 9.4  --  8.7 8.8  MG  --  1.8  --   --   --    Liver Function Tests:  Recent Labs Lab 07/26/14 0917 07/29/14 0936  AST 38* 38*  ALT 27 24  ALKPHOS 108 123*  BILITOT 0.9 0.9  PROT 7.0 7.4  ALBUMIN 3.7 4.1    Recent Labs Lab 07/29/14 0936  LIPASE 48   No results for input(s): AMMONIA in the last 168 hours. CBC:  Recent Labs Lab 07/26/14 0917 07/29/14 0936  07/29/14 2315 07/30/14 0508 07/30/14 1907 07/31/14 0403 08/01/14 0418  WBC 3.6* 3.9*  --   --   --   --  3.0* 2.8*  NEUTROABS 2.3 2.6  --   --   --   --   --   --   HGB 7.8* 8.7*  < > 7.7* 7.4* 10.4* 11.2* 10.5*  HCT 24.3* 26.8*  < > 24.4* 23.6* 32.2* 34.6* 33.3*  MCV 89.3 89.3  --   --   --   --  86.1 87.2  PLT 57* 154  --   --   --   --  176 208  < > = values in this interval not displayed. Cardiac Enzymes: No results for input(s): CKTOTAL, CKMB, CKMBINDEX, TROPONINI in the last 168 hours. BNP: BNP (last 3 results) No results for input(s): PROBNP in the last 8760 hours. CBG: No results for input(s): GLUCAP in the last 168  hours.     SignedHosie Poisson  [facility] 08/01/2014, 3:57 PM

## 2014-08-02 ENCOUNTER — Ambulatory Visit: Payer: Medicare Other

## 2014-08-02 ENCOUNTER — Other Ambulatory Visit: Payer: Medicare Other

## 2014-08-02 ENCOUNTER — Telehealth: Payer: Self-pay | Admitting: Internal Medicine

## 2014-08-02 ENCOUNTER — Ambulatory Visit: Payer: Medicare Other | Admitting: Physician Assistant

## 2014-08-02 NOTE — Telephone Encounter (Signed)
pt called to r/s appt due to just getting out of the hospital....done...pt ok and aware

## 2014-08-08 ENCOUNTER — Other Ambulatory Visit: Payer: Medicare Other

## 2014-08-08 ENCOUNTER — Ambulatory Visit: Payer: Medicare Other | Admitting: Oncology

## 2014-08-10 ENCOUNTER — Other Ambulatory Visit: Payer: Self-pay | Admitting: *Deleted

## 2014-08-11 ENCOUNTER — Ambulatory Visit (HOSPITAL_BASED_OUTPATIENT_CLINIC_OR_DEPARTMENT_OTHER): Payer: Medicare Other

## 2014-08-11 ENCOUNTER — Telehealth: Payer: Self-pay | Admitting: Internal Medicine

## 2014-08-11 ENCOUNTER — Other Ambulatory Visit (HOSPITAL_BASED_OUTPATIENT_CLINIC_OR_DEPARTMENT_OTHER): Payer: Medicare Other

## 2014-08-11 ENCOUNTER — Encounter: Payer: Self-pay | Admitting: Physician Assistant

## 2014-08-11 ENCOUNTER — Ambulatory Visit (HOSPITAL_BASED_OUTPATIENT_CLINIC_OR_DEPARTMENT_OTHER): Payer: Medicare Other | Admitting: Physician Assistant

## 2014-08-11 VITALS — BP 151/57 | HR 87 | Temp 98.0°F | Resp 18 | Ht 62.0 in | Wt 144.7 lb

## 2014-08-11 DIAGNOSIS — K5790 Diverticulosis of intestine, part unspecified, without perforation or abscess without bleeding: Secondary | ICD-10-CM

## 2014-08-11 DIAGNOSIS — R739 Hyperglycemia, unspecified: Secondary | ICD-10-CM | POA: Diagnosis not present

## 2014-08-11 DIAGNOSIS — K921 Melena: Secondary | ICD-10-CM

## 2014-08-11 DIAGNOSIS — C3411 Malignant neoplasm of upper lobe, right bronchus or lung: Secondary | ICD-10-CM

## 2014-08-11 DIAGNOSIS — Z5111 Encounter for antineoplastic chemotherapy: Secondary | ICD-10-CM | POA: Diagnosis not present

## 2014-08-11 DIAGNOSIS — K922 Gastrointestinal hemorrhage, unspecified: Secondary | ICD-10-CM

## 2014-08-11 DIAGNOSIS — C3491 Malignant neoplasm of unspecified part of right bronchus or lung: Secondary | ICD-10-CM

## 2014-08-11 LAB — COMPREHENSIVE METABOLIC PANEL (CC13)
ALT: 29 U/L (ref 0–55)
ANION GAP: 8 meq/L (ref 3–11)
AST: 36 U/L — ABNORMAL HIGH (ref 5–34)
Albumin: 3.7 g/dL (ref 3.5–5.0)
Alkaline Phosphatase: 142 U/L (ref 40–150)
BILIRUBIN TOTAL: 0.71 mg/dL (ref 0.20–1.20)
BUN: 8.4 mg/dL (ref 7.0–26.0)
CALCIUM: 9.9 mg/dL (ref 8.4–10.4)
CHLORIDE: 103 meq/L (ref 98–109)
CO2: 25 meq/L (ref 22–29)
Creatinine: 0.7 mg/dL (ref 0.6–1.1)
EGFR: 84 mL/min/{1.73_m2} — ABNORMAL LOW (ref >90)
GLUCOSE: 254 mg/dL — AB (ref 70–140)
POTASSIUM: 4.3 meq/L (ref 3.5–5.1)
Sodium: 137 mEq/L (ref 136–145)
TOTAL PROTEIN: 7.4 g/dL (ref 6.4–8.3)

## 2014-08-11 LAB — CBC WITH DIFFERENTIAL/PLATELET
BASO%: 0.5 % (ref 0.0–2.0)
BASOS ABS: 0.1 10*3/uL (ref 0.0–0.1)
EOS%: 0 % (ref 0.0–7.0)
Eosinophils Absolute: 0 10*3/uL (ref 0.0–0.5)
HEMATOCRIT: 39.2 % (ref 34.8–46.6)
HGB: 12.4 g/dL (ref 11.6–15.9)
LYMPH#: 0.9 10*3/uL (ref 0.9–3.3)
LYMPH%: 9.1 % — ABNORMAL LOW (ref 14.0–49.7)
MCH: 27.6 pg (ref 25.1–34.0)
MCHC: 31.7 g/dL (ref 31.5–36.0)
MCV: 87 fL (ref 79.5–101.0)
MONO#: 0.5 10*3/uL (ref 0.1–0.9)
MONO%: 5.4 % (ref 0.0–14.0)
NEUT#: 8.6 10*3/uL — ABNORMAL HIGH (ref 1.5–6.5)
NEUT%: 85 % — ABNORMAL HIGH (ref 38.4–76.8)
PLATELETS: 225 10*3/uL (ref 145–400)
RBC: 4.5 10*6/uL (ref 3.70–5.45)
RDW: 20.8 % — ABNORMAL HIGH (ref 11.2–14.5)
WBC: 10.1 10*3/uL (ref 3.9–10.3)

## 2014-08-11 MED ORDER — SODIUM CHLORIDE 0.9 % IV SOLN
400.0000 mg | Freq: Once | INTRAVENOUS | Status: AC
  Administered 2014-08-11: 400 mg via INTRAVENOUS
  Filled 2014-08-11: qty 40

## 2014-08-11 MED ORDER — SODIUM CHLORIDE 0.9 % IV SOLN
399.0000 mg | Freq: Once | INTRAVENOUS | Status: DC
  Filled 2014-08-11: qty 40

## 2014-08-11 MED ORDER — ONDANSETRON 16 MG/50ML IVPB (CHCC)
INTRAVENOUS | Status: AC
  Filled 2014-08-11: qty 16

## 2014-08-11 MED ORDER — DEXAMETHASONE SODIUM PHOSPHATE 20 MG/5ML IJ SOLN
INTRAMUSCULAR | Status: AC
  Filled 2014-08-11: qty 5

## 2014-08-11 MED ORDER — SODIUM CHLORIDE 0.9 % IV SOLN
Freq: Once | INTRAVENOUS | Status: AC
  Administered 2014-08-11: 12:00:00 via INTRAVENOUS

## 2014-08-11 MED ORDER — DEXAMETHASONE SODIUM PHOSPHATE 20 MG/5ML IJ SOLN
20.0000 mg | Freq: Once | INTRAMUSCULAR | Status: AC
  Administered 2014-08-11: 20 mg via INTRAVENOUS

## 2014-08-11 MED ORDER — SODIUM CHLORIDE 0.9 % IV SOLN
500.0000 mg/m2 | Freq: Once | INTRAVENOUS | Status: AC
  Administered 2014-08-11: 850 mg via INTRAVENOUS
  Filled 2014-08-11: qty 34

## 2014-08-11 MED ORDER — ONDANSETRON 16 MG/50ML IVPB (CHCC)
16.0000 mg | Freq: Once | INTRAVENOUS | Status: AC
  Administered 2014-08-11: 16 mg via INTRAVENOUS

## 2014-08-11 NOTE — Telephone Encounter (Signed)
gv adn printed appt sched and avs for pt for Jan and Feb....sed added tx. °

## 2014-08-11 NOTE — Patient Instructions (Signed)
[facility] Discharge Instructions for Patients Receiving Chemotherapy  Today you received the following chemotherapy agents Alimta/Carboplatin.  To help prevent nausea and vomiting after your treatment, we encourage you to take your nausea medication as directed.    If you develop nausea and vomiting that is not controlled by your nausea medication, call the clinic.   BELOW ARE SYMPTOMS THAT SHOULD BE REPORTED IMMEDIATELY:  *FEVER GREATER THAN 100.5 F  *CHILLS WITH OR WITHOUT FEVER  NAUSEA AND VOMITING THAT IS NOT CONTROLLED WITH YOUR NAUSEA MEDICATION  *UNUSUAL SHORTNESS OF BREATH  *UNUSUAL BRUISING OR BLEEDING  TENDERNESS IN MOUTH AND THROAT WITH OR WITHOUT PRESENCE OF ULCERS  *URINARY PROBLEMS  *BOWEL PROBLEMS  UNUSUAL RASH Items with * indicate a potential emergency and should be followed up as soon as possible.  Feel free to call the clinic you have any questions or concerns. The clinic phone number is [phone].

## 2014-08-11 NOTE — Patient Instructions (Signed)
Your recent CT scan revealed no evidence of disease recurrence of progression Continue weekly labs drawn at [facility] Follow up in 3 weeks, prior to your next scheduled cycle of chemotherapy

## 2014-08-11 NOTE — Progress Notes (Addendum)
[place] [phone]   [phone]  OFFICE PROGRESS NOTE  [name], PA-C [address] [place] [address]  DIAGNOSIS: Metastatic non-small cell lung cancer, adenocarcinoma. This was initially diagnosed as stage IB (T2a, N0, M0) In March of 2014, status post right upper lobectomy with lymph node dissection but the patient has evidence for disease recurrence in the right axilla status post resection.  PRIOR THERAPY: Status post right axillary lymph node biopsy on 05/01/2014 under the care of Dr. Roxan Hockey  CURRENT THERAPY: Systemic chemotherapy with carboplatin for AUC of 5 and Alimta 500 mg/M2 every 3 weeks. First cycle 05/31/2014. Status post 3 cycles.  INTERVAL HISTORY: Nancy Hodges 69 y.o. female returns to the clinic today for followup visit. The patient is currently undergoing systemic chemotherapy with carboplatin and Alimta status post 3 cycles and tolerating her treatment fairly well She denied having any significant chest pain, shortness of breath, cough or hemoptysis. She has no weight loss or night sweats. She denied having any significant nausea or vomiting, no fever or chills. She has been started on Januvia for her hyperglycemia. She reports that she gets her weekly labs done at [facility]. She is here today to start cycle #4.  MEDICAL HISTORY: Past Medical History  Diagnosis Date  . Asthma   . History of sinusitis   . Allergic rhinitis   . History of kidney stones   . 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 H/O hiatal hernia   . GERD (gastroesophageal reflux disease)   . Pulmonary nodule   . Paroxysmal atrial fibrillation     CHADS score: 0  . Atrial flutter   . Tachycardia-bradycardia syndrome     a. 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  [name] Anxiety   . Headache(784.0)     sinus  . Diverticulosis   . Internal hemorrhoid   . Dysrhythmia   . Pneumonia June 2014  . Arthritis   .  Thin skin   . Bruises easily     patient on Xarelto  . Lung cancer     a. Stage IB non-small cell carcinoma, s/p R VATS, wedge resection, RU lobectomy 10/2012.    ALLERGIES:  is allergic to prednisone; septra; penicillins; and vioxx.  MEDICATIONS:  Current Outpatient Prescriptions  Medication Sig Dispense Refill  . acetaminophen (TYLENOL) 500 MG tablet Take 500 mg by mouth every 6 (six) hours as needed for moderate pain or fever.     [name] albuterol (PROVENTIL HFA;VENTOLIN HFA) 108 (90 BASE) MCG/ACT inhaler Inhale 1-2 puffs into the lungs every 6 (six) hours as needed for wheezing or shortness of breath.    [name] albuterol (PROVENTIL) (2.5 MG/3ML) 0.083% nebulizer solution Take 3 mLs (2.5 mg total) by nebulization every 2 (two) hours as needed for shortness of breath. 120 mL 0  . amiodarone (PACERONE) 100 MG tablet Take 1 tablet (100 mg total) by mouth daily. 30 tablet 11  . dexamethasone (DECADRON) 4 MG tablet 4 mg po bid the day before, day of and day after chemo (Patient taking differently: Take 4 mg by mouth 2 (two) times daily. the day before, day of and day after chemo) 40 tablet 1  . diltiazem (CARDIZEM CD) 240 MG 24 hr capsule Take 1 capsule (240 mg total) by mouth daily. 30 capsule 10  . fluticasone (FLONASE) 50 MCG/ACT nasal spray Place 2 sprays into the nose daily as needed (for congestion).    [name]  folic acid (FOLVITE) 1 MG tablet Take 1 tablet (1 mg total) by mouth daily. 30 tablet 4  . ipratropium-albuterol (DUONEB) 0.5-2.5 (3) MG/3ML SOLN Take 3 mLs by nebulization every 6 (six) hours as needed. 360 mL 1  . PRESCRIPTION MEDICATION Chemo - [facility]    . rivaroxaban (XARELTO) 20 MG TABS tablet Take 1 tablet (20 mg total) by mouth daily. (Patient taking differently: Take 20 mg by mouth daily with supper. ) 30 tablet 11  . traMADol (ULTRAM) 50 MG tablet Take 1-2 tablets (50-100 mg total) by mouth every 6 (six) hours as needed (pain). 30 tablet 0  . guaiFENesin-dextromethorphan (ROBITUSSIN DM)  100-10 MG/5ML syrup Take 5 mLs by mouth 3 (three) times daily as needed for cough. (Patient not taking: Reported on 08/11/2014) 118 mL 0  . JANUVIA 50 MG tablet   3  . prochlorperazine (COMPAZINE) 10 MG tablet Take 1 tablet (10 mg total) by mouth every 6 (six) hours as needed for nausea or vomiting. (Patient not taking: Reported on 08/11/2014) 30 tablet 0   No current facility-administered medications for this visit.    SURGICAL HISTORY:  Past Surgical History  Procedure Laterality Date  . Partial hysterectomy    . Pacemaker insertion  09/14/2012    Nanostim (SJM) leadless pacemaker (LEADLESS II STUDY PATIENT) implanted by Dr [name]  . Video assisted thoracoscopy (vats)/wedge resection Right 10/27/2012    Procedure: VIDEO ASSISTED THORACOSCOPY (VATS),RGHT UPPER LOBE LUNG WEDGE RESECTION;  Surgeon: [name], MD;  Location: [facility];  Service: Thoracic;  Laterality: Right;  . Lobectomy Right 10/27/2012    Procedure: LOBECTOMY;  Surgeon: [name], MD;  Location: [facility];  Service: Thoracic;  Laterality: Right;   RIGHT UPPER LOBECTOMY & Node Dissection  . Abdominal hysterectomy    . Insert / replace / remove pacemaker      [employer]  . Colonoscopy w/ polypectomy  2015  . Lymph node biopsy Right 05/01/2014    Procedure: LYMPH NODE BIOPSY, Right Axillary;  Surgeon: [name], MD;  Location: [facility];  Service: Thoracic;  Laterality: Right;  . Permanent pacemaker insertion N/A 09/14/2012    Procedure: PERMANENT PACEMAKER INSERTION;  Surgeon: [name], MD;  Location: [facility] CATH LAB;  Service: Cardiovascular;  Laterality: N/A;    REVIEW OF SYSTEMS:  Constitutional: negative Eyes: negative Ears, nose, mouth, throat, and face: negative Respiratory: negative Cardiovascular: negative Gastrointestinal: negative Genitourinary:negative Integument/breast: negative Hematologic/lymphatic: negative Musculoskeletal:negative Neurological: negative Behavioral/Psych:  negative Endocrine: negative Allergic/Immunologic: negative   PHYSICAL EXAMINATION: General appearance: alert, cooperative and no distress Head: Normocephalic, without obvious abnormality, atraumatic Neck: no adenopathy, no JVD, supple, symmetrical, trachea midline and thyroid not enlarged, symmetric, no tenderness/mass/nodules Lymph nodes: Cervical, supraclavicular, and axillary nodes normal. Resp: clear to auscultation bilaterally Back: symmetric, no curvature. ROM normal. No CVA tenderness. Cardio: regular rate and rhythm, S1, S2 normal, no murmur, click, rub or gallop GI: soft, non-tender; bowel sounds normal; no masses,  no organomegaly Extremities: extremities normal, atraumatic, no cyanosis or edema Neurologic: Alert and oriented X 3, normal strength and tone. Normal symmetric reflexes. Normal coordination and gait  ECOG PERFORMANCE STATUS: 1 - Symptomatic but completely ambulatory  Blood pressure 151/57, pulse 87, temperature 98 F (36.7 C), temperature source Oral, resp. rate 18, height _0  (1.575 m), weight 144 lb 11.2 oz (65.635 kg), SpO2 97 %.  LABORATORY DATA: Lab Results  Component Value Date   WBC 10.1 08/11/2014   HGB 12.4 08/11/2014   HCT 39.2  08/11/2014   MCV 87.0 08/11/2014   PLT 225 08/11/2014      Chemistry      Component Value Date/Time   NA 137 08/11/2014 0955   NA 139 08/01/2014 0418   K 4.3 08/11/2014 0955   K 4.0 08/01/2014 0418   CL 108 08/01/2014 0418   CO2 25 08/11/2014 0955   CO2 26 08/01/2014 0418   BUN 8.4 08/11/2014 0955   BUN 9 08/01/2014 0418   CREATININE 0.7 08/11/2014 0955   CREATININE 0.54 08/01/2014 0418      Component Value Date/Time   CALCIUM 9.9 08/11/2014 0955   CALCIUM 8.8 08/01/2014 0418   ALKPHOS 142 08/11/2014 0955   ALKPHOS 123* 07/29/2014 0936   AST 36* 08/11/2014 0955   AST 38* 07/29/2014 0936   ALT 29 08/11/2014 0955   ALT 24 07/29/2014 0936   BILITOT 0.71 08/11/2014 0955   BILITOT 0.9 07/29/2014 0936        RADIOGRAPHIC STUDIES: Ct Chest W Contrast  08/01/2014   CLINICAL DATA:  69 year old female with history of lung cancer, currently with cough, shortness of breath and possible upper respiratory tract infection  EXAM: CT CHEST WITH CONTRAST  TECHNIQUE: Multidetector CT imaging of the chest was performed during intravenous contrast administration.  CONTRAST:  120m OMNIPAQUE IOHEXOL 300 MG/ML  SOLN  COMPARISON:  04/25/2014  FINDINGS: Heart/mediastinum: An electronic device in the region of the right ventricular apex likely represents an implanted pacemaker. The heart size is mildly enlarged. There is no pericardial effusion. A few scattered atherosclerotic aortic and coronary artery calcifications are present. Mitral annular calcifications are also noted.  No pathologically enlarged mediastinal, hilar or axillary lymph nodes are identified. Surgical clips are noted within the right axilla.  Chest: Postsurgical changes from a prior right upper lobectomy are noted. A few areas of linear scarring are noted within the right middle lobe and along the pleural of the right lower lobe which are similar to prior. Areas of dependent atelectasis are noted in the lung bases. There is no suspicious pulmonary nodule or mass. Evidence of mild centrilobular emphysema is noted. There is no pleural effusion or pneumothorax.  Upper abdomen: The liver is diffusely low in attenuation, compatible with hepatic steatosis. The spleen is enlarged and measures up to 14.1 cm. A few dependent gallstones are present. The remaining partially visualized upper abdomen is grossly unremarkable.  Osseous structures: Multilevel degenerative changes of the spine are present. No acute osseous abnormality is identified.  IMPRESSION: 1. Postsurgical changes from prior right upper lobectomy. 2. No definite evidence of disease recurrence or metastatic disease involving the chest. 3. Splenomegaly. 4. Mild centrilobular emphysema. 5. Atherosclerotic  coronary artery disease. 6. Hepatic steatosis.   Electronically Signed   By: KRosemarie Ax  On: 08/01/2014 10:51   Ct Abdomen Pelvis W Contrast  07/29/2014   CLINICAL DATA:  Onset of lower abdominal cramping during blood transfusion today, dark stools, pressure feeling to have bowel movement, productive cough, history asthma, atrial fibrillation, lung cancer, GERD  EXAM: CT ABDOMEN AND PELVIS WITH CONTRAST  TECHNIQUE: Multidetector CT imaging of the abdomen and pelvis was performed using the standard protocol following bolus administration of intravenous contrast. Sagittal and coronal MPR images reconstructed from axial data set.  CONTRAST:  540mOMNIPAQUE IOHEXOL 300 MG/ML SOLN PO, 10046mMNIPAQUE IOHEXOL 300 MG/ML SOLN IV  COMPARISON:  PET-CT 05/17/2014  FINDINGS: Scarring at anterior and lateral RIGHT lung base.  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  Calcified  gallstones in gallbladder.  Spleen minimally prominent in size.  Liver, spleen, pancreas, kidneys, and adrenal glands otherwise grossly normal.  Scattered atherosclerotic calcifications.  Multiple normal and upper normal sized periportal lymph nodes decreased in size is since prior PET-CT.  Normal appendix.  Sigmoid diverticulosis without evidence of diverticulitis.  Stomach and remaining bowel loops normal appearance.  Colon incompletely distended, no additional gross abnormality seen.  Stomach and small bowel loops normal appearance.  Uterus surgically absent with unremarkable bladder, ureters, and ovaries.  No mass, adenopathy, free fluid, free air, inflammatory process, hernia, or acute bone lesion.  IMPRESSION: Cholelithiasis.  Sigmoid diverticulosis without evidence of diverticulitis.  Decreased periportal adenopathy since prior PET-CT.  No acute intra-abdominal or intrapelvic abnormalities.   Electronically Signed   By: Lavonia Dana M.D.   On: 07/29/2014 11:47   Dg Chest Portable 1 View  07/29/2014   CLINICAL DATA:  Hemoptysis  post blood transfusion and cancer center today. Lung cancer.  EXAM: PORTABLE CHEST - 1 VIEW  COMPARISON:  07/22/2014  FINDINGS: Patient is slightly rotated to the right. Lungs are adequately inflated with postsurgical volume loss of the right lung unchanged. Mild stable blunting of the right costophrenic angle. Surgical sutures over the right perihilar/paramediastinal region. Cardiomediastinal silhouette and remainder of the exam is unchanged.  IMPRESSION: No active disease.  Postsurgical change/volume loss of the right lung.   Electronically Signed   By: Marin Olp M.D.   On: 07/29/2014 10:37   Dg Chest Port 1 View  07/22/2014   CLINICAL DATA:  Fever for 3 days, cough, congestion for 3-4 days.  EXAM: PORTABLE CHEST - 1 VIEW  COMPARISON:  PET-CT 05/17/2014  FINDINGS: There is bibasilar scarring. There is no focal consolidation, pleural effusion or pneumothorax. There is right lateral pleural thickening unchanged from the prior exam. Stable cardiomediastinal silhouette. No acute osseous abnormality. Surgical clips in the right axilla.  IMPRESSION: No acute cardiopulmonary disease.   Electronically Signed   By: Kathreen Devoid   On: 07/22/2014 14:42   ASSESSMENT AND PLAN: This is a very pleasant 69 years old white female recently diagnosed with metastatic non-small cell lung cancer, adenocarcinoma with negative EGFR mutation and negative ALK gene translocation. This was initially diagnosed as stage IB status post right upper lobectomy but the patient had evidence for disease recurrence in the right axilla status post resection. She is currently undergoing systemic chemotherapy with carboplatin and Alimta status post 3 cycles and to rating her treatment well. Patient was discussed with and also seen by Dr. Julien Nordmann. Her recent restaging CT scan revealed no evidence for recurrence or progressive disease. She'll proceed with cycle #4 of her systemic chemotherapy with carboplatin and Alimta as scheduled today. She  will continue with her weekly labs drawn at [facility]. She'll follow-up in 3 weeks prior to cycle #5  She was advised to call immediately if she has any concerning symptoms in the interval. The patient voices understanding of current disease status and treatment options and is in agreement with the current care plan.  All questions were answered. The patient knows to call the clinic with any problems, questions or concerns. We can certainly see the patient much sooner if necessary.  Carlton Adam, PA-C 08/11/2014  ADDENDUM: Hematology/Oncology Attending: I had a face to face encounter with the patient. I recommended her care plan. This is a very pleasant 69 years old white female with metastatic non-small cell lung cancer, adenocarcinoma currently undergoing systemic chemotherapy was carboplatin and  Alimta status post 3 cycles. The patient tolerated the last 3 cycles of her treatment fairly well with no significant adverse effects. She was recently admitted to [facility] with acute GI bleed which resolved spontaneously. CT scan of the abdomen pelvis during her admission showed diverticulosis without diverticulitis. The patient received 2 units of PRBCs transfusion during her admission. She is feeling much better today. She also had repeat CT scan of the chest chest showed no evidence for disease progression. I discussed the scan results with the patient and recommended for her to proceed with her systemic chemotherapy was carboplatin and Alimta as scheduled. She would come back for follow-up visit in 3 weeks with the next cycle of her treatment.  The patient was advised to call immediately if she has any concerning symptoms in the interval.  Disclaimer: This note was dictated with voice recognition software. Similar sounding words can inadvertently be transcribed and may not be corrected upon review. Eilleen Kempf., MD 08/12/2014

## 2014-08-18 ENCOUNTER — Encounter (HOSPITAL_COMMUNITY): Payer: Medicare Other | Attending: Internal Medicine

## 2014-08-18 DIAGNOSIS — C3491 Malignant neoplasm of unspecified part of right bronchus or lung: Secondary | ICD-10-CM | POA: Insufficient documentation

## 2014-08-18 DIAGNOSIS — C3411 Malignant neoplasm of upper lobe, right bronchus or lung: Secondary | ICD-10-CM

## 2014-08-18 LAB — CBC WITH DIFFERENTIAL/PLATELET
Basophils Absolute: 0 10*3/uL (ref 0.0–0.1)
Basophils Relative: 0 % (ref 0–1)
EOS PCT: 1 % (ref 0–5)
Eosinophils Absolute: 0 10*3/uL (ref 0.0–0.7)
HEMATOCRIT: 34.5 % — AB (ref 36.0–46.0)
Hemoglobin: 11.2 g/dL — ABNORMAL LOW (ref 12.0–15.0)
LYMPHS ABS: 1.1 10*3/uL (ref 0.7–4.0)
Lymphocytes Relative: 43 % (ref 12–46)
MCH: 28.5 pg (ref 26.0–34.0)
MCHC: 32.5 g/dL (ref 30.0–36.0)
MCV: 87.8 fL (ref 78.0–100.0)
MONO ABS: 0.3 10*3/uL (ref 0.1–1.0)
Monocytes Relative: 11 % (ref 3–12)
Neutro Abs: 1.2 10*3/uL — ABNORMAL LOW (ref 1.7–7.7)
Neutrophils Relative %: 45 % (ref 43–77)
Platelets: 106 10*3/uL — ABNORMAL LOW (ref 150–400)
RBC: 3.93 MIL/uL (ref 3.87–5.11)
RDW: 18 % — AB (ref 11.5–15.5)
WBC: 2.6 10*3/uL — ABNORMAL LOW (ref 4.0–10.5)

## 2014-08-18 LAB — COMPREHENSIVE METABOLIC PANEL
ALK PHOS: 105 U/L (ref 39–117)
ALT: 38 U/L — ABNORMAL HIGH (ref 0–35)
ANION GAP: 6 (ref 5–15)
AST: 40 U/L — AB (ref 0–37)
Albumin: 3.8 g/dL (ref 3.5–5.2)
BUN: 11 mg/dL (ref 6–23)
CHLORIDE: 105 meq/L (ref 96–112)
CO2: 29 mmol/L (ref 19–32)
Calcium: 9.6 mg/dL (ref 8.4–10.5)
Creatinine, Ser: 0.6 mg/dL (ref 0.50–1.10)
Glucose, Bld: 110 mg/dL — ABNORMAL HIGH (ref 70–99)
Potassium: 4.2 mmol/L (ref 3.5–5.1)
Sodium: 140 mmol/L (ref 135–145)
Total Bilirubin: 0.9 mg/dL (ref 0.3–1.2)
Total Protein: 6.8 g/dL (ref 6.0–8.3)

## 2014-08-18 NOTE — Progress Notes (Signed)
Labs for cbcd,cmp

## 2014-08-23 ENCOUNTER — Encounter (HOSPITAL_BASED_OUTPATIENT_CLINIC_OR_DEPARTMENT_OTHER): Payer: Medicare Other

## 2014-08-23 DIAGNOSIS — C3411 Malignant neoplasm of upper lobe, right bronchus or lung: Secondary | ICD-10-CM

## 2014-08-23 DIAGNOSIS — C3491 Malignant neoplasm of unspecified part of right bronchus or lung: Secondary | ICD-10-CM

## 2014-08-23 LAB — COMPREHENSIVE METABOLIC PANEL
ALK PHOS: 110 U/L (ref 39–117)
ALT: 41 U/L — AB (ref 0–35)
AST: 42 U/L — ABNORMAL HIGH (ref 0–37)
Albumin: 3.7 g/dL (ref 3.5–5.2)
Anion gap: 6 (ref 5–15)
BUN: 7 mg/dL (ref 6–23)
CALCIUM: 9.1 mg/dL (ref 8.4–10.5)
CO2: 28 mmol/L (ref 19–32)
Chloride: 104 mEq/L (ref 96–112)
Creatinine, Ser: 0.7 mg/dL (ref 0.50–1.10)
GFR, EST NON AFRICAN AMERICAN: 86 mL/min — AB (ref >90)
GLUCOSE: 168 mg/dL — AB (ref 70–99)
POTASSIUM: 3.6 mmol/L (ref 3.5–5.1)
Sodium: 138 mmol/L (ref 135–145)
Total Bilirubin: 1 mg/dL (ref 0.3–1.2)
Total Protein: 6.5 g/dL (ref 6.0–8.3)

## 2014-08-23 LAB — CBC WITH DIFFERENTIAL/PLATELET
Basophils Absolute: 0 10*3/uL (ref 0.0–0.1)
Basophils Relative: 0 % (ref 0–1)
Eosinophils Absolute: 0 10*3/uL (ref 0.0–0.7)
Eosinophils Relative: 1 % (ref 0–5)
HCT: 28.8 % — ABNORMAL LOW (ref 36.0–46.0)
Hemoglobin: 9.3 g/dL — ABNORMAL LOW (ref 12.0–15.0)
LYMPHS ABS: 0.8 10*3/uL (ref 0.7–4.0)
Lymphocytes Relative: 38 % (ref 12–46)
MCH: 28.2 pg (ref 26.0–34.0)
MCHC: 32.3 g/dL (ref 30.0–36.0)
MCV: 87.3 fL (ref 78.0–100.0)
MONO ABS: 0.2 10*3/uL (ref 0.1–1.0)
Monocytes Relative: 11 % (ref 3–12)
NEUTROS ABS: 1.1 10*3/uL — AB (ref 1.7–7.7)
NEUTROS PCT: 50 % (ref 43–77)
Platelets: 54 10*3/uL — ABNORMAL LOW (ref 150–400)
RBC: 3.3 MIL/uL — ABNORMAL LOW (ref 3.87–5.11)
RDW: 17.5 % — ABNORMAL HIGH (ref 11.5–15.5)
WBC: 2.1 10*3/uL — AB (ref 4.0–10.5)

## 2014-08-23 NOTE — Progress Notes (Signed)
Labs for cmp,cbcd

## 2014-08-24 ENCOUNTER — Other Ambulatory Visit (HOSPITAL_COMMUNITY): Payer: Medicare Other

## 2014-08-25 ENCOUNTER — Other Ambulatory Visit (HOSPITAL_COMMUNITY): Payer: Medicare Other

## 2014-09-01 ENCOUNTER — Ambulatory Visit (HOSPITAL_BASED_OUTPATIENT_CLINIC_OR_DEPARTMENT_OTHER): Payer: Medicare Other | Admitting: Physician Assistant

## 2014-09-01 ENCOUNTER — Encounter: Payer: Self-pay | Admitting: Physician Assistant

## 2014-09-01 ENCOUNTER — Other Ambulatory Visit (HOSPITAL_BASED_OUTPATIENT_CLINIC_OR_DEPARTMENT_OTHER): Payer: Medicare Other

## 2014-09-01 ENCOUNTER — Telehealth: Payer: Self-pay

## 2014-09-01 ENCOUNTER — Telehealth: Payer: Self-pay | Admitting: Internal Medicine

## 2014-09-01 ENCOUNTER — Ambulatory Visit (HOSPITAL_BASED_OUTPATIENT_CLINIC_OR_DEPARTMENT_OTHER): Payer: Medicare Other

## 2014-09-01 ENCOUNTER — Encounter: Payer: Self-pay | Admitting: Oncology

## 2014-09-01 DIAGNOSIS — C3491 Malignant neoplasm of unspecified part of right bronchus or lung: Secondary | ICD-10-CM

## 2014-09-01 DIAGNOSIS — R739 Hyperglycemia, unspecified: Secondary | ICD-10-CM

## 2014-09-01 DIAGNOSIS — C773 Secondary and unspecified malignant neoplasm of axilla and upper limb lymph nodes: Secondary | ICD-10-CM

## 2014-09-01 DIAGNOSIS — C3411 Malignant neoplasm of upper lobe, right bronchus or lung: Secondary | ICD-10-CM

## 2014-09-01 DIAGNOSIS — Z5111 Encounter for antineoplastic chemotherapy: Secondary | ICD-10-CM

## 2014-09-01 LAB — COMPREHENSIVE METABOLIC PANEL (CC13)
ALBUMIN: 3.8 g/dL (ref 3.5–5.0)
ALT: 33 U/L (ref 0–55)
AST: 36 U/L — ABNORMAL HIGH (ref 5–34)
Alkaline Phosphatase: 131 U/L (ref 40–150)
Anion Gap: 11 mEq/L (ref 3–11)
BUN: 9.3 mg/dL (ref 7.0–26.0)
CALCIUM: 10 mg/dL (ref 8.4–10.4)
CO2: 22 mEq/L (ref 22–29)
CREATININE: 0.7 mg/dL (ref 0.6–1.1)
Chloride: 106 mEq/L (ref 98–109)
EGFR: 83 mL/min/{1.73_m2} — AB (ref >90)
GLUCOSE: 252 mg/dL — AB (ref 70–140)
Potassium: 4.3 mEq/L (ref 3.5–5.1)
Sodium: 139 mEq/L (ref 136–145)
Total Bilirubin: 0.96 mg/dL (ref 0.20–1.20)
Total Protein: 7.2 g/dL (ref 6.4–8.3)

## 2014-09-01 LAB — CBC WITH DIFFERENTIAL/PLATELET
BASO%: 0.1 % (ref 0.0–2.0)
BASOS ABS: 0 10*3/uL (ref 0.0–0.1)
EOS%: 0 % (ref 0.0–7.0)
Eosinophils Absolute: 0 10*3/uL (ref 0.0–0.5)
HEMATOCRIT: 34.2 % — AB (ref 34.8–46.6)
HEMOGLOBIN: 11.1 g/dL — AB (ref 11.6–15.9)
LYMPH%: 9.7 % — ABNORMAL LOW (ref 14.0–49.7)
MCH: 28.6 pg (ref 25.1–34.0)
MCHC: 32.4 g/dL (ref 31.5–36.0)
MCV: 88.4 fL (ref 79.5–101.0)
MONO#: 0.4 10*3/uL (ref 0.1–0.9)
MONO%: 10.7 % (ref 0.0–14.0)
NEUT#: 3.3 10*3/uL (ref 1.5–6.5)
NEUT%: 79.5 % — ABNORMAL HIGH (ref 38.4–76.8)
Platelets: 289 10*3/uL (ref 145–400)
RBC: 3.87 10*6/uL (ref 3.70–5.45)
RDW: 23.3 % — AB (ref 11.2–14.5)
WBC: 4.1 10*3/uL (ref 3.9–10.3)
lymph#: 0.4 10*3/uL — ABNORMAL LOW (ref 0.9–3.3)

## 2014-09-01 MED ORDER — SODIUM CHLORIDE 0.9 % IV SOLN
500.0000 mg/m2 | Freq: Once | INTRAVENOUS | Status: AC
  Administered 2014-09-01: 850 mg via INTRAVENOUS
  Filled 2014-09-01: qty 34

## 2014-09-01 MED ORDER — DEXAMETHASONE SODIUM PHOSPHATE 10 MG/ML IJ SOLN
5.0000 mg | Freq: Once | INTRAMUSCULAR | Status: AC
  Administered 2014-09-01: 5 mg via INTRAVENOUS

## 2014-09-01 MED ORDER — SODIUM CHLORIDE 0.9 % IV SOLN
Freq: Once | INTRAVENOUS | Status: AC
  Administered 2014-09-01: 12:00:00 via INTRAVENOUS

## 2014-09-01 MED ORDER — SODIUM CHLORIDE 0.9 % IV SOLN
400.0000 mg | Freq: Once | INTRAVENOUS | Status: AC
  Administered 2014-09-01: 400 mg via INTRAVENOUS
  Filled 2014-09-01: qty 40

## 2014-09-01 MED ORDER — DEXAMETHASONE SODIUM PHOSPHATE 10 MG/ML IJ SOLN
INTRAMUSCULAR | Status: AC
  Filled 2014-09-01: qty 1

## 2014-09-01 MED ORDER — ONDANSETRON 16 MG/50ML IVPB (CHCC)
16.0000 mg | Freq: Once | INTRAVENOUS | Status: AC
  Administered 2014-09-01: 16 mg via INTRAVENOUS

## 2014-09-01 MED ORDER — ONDANSETRON 16 MG/50ML IVPB (CHCC)
INTRAVENOUS | Status: AC
  Filled 2014-09-01: qty 16

## 2014-09-01 NOTE — Patient Instructions (Signed)
[facility] Discharge Instructions for Patients Receiving Chemotherapy  Today you received the following chemotherapy agents Alimta and Carboplatin  To help prevent nausea and vomiting after your treatment, we encourage you to take your nausea medication as prescribed.   If you develop nausea and vomiting that is not controlled by your nausea medication, call the clinic.   BELOW ARE SYMPTOMS THAT SHOULD BE REPORTED IMMEDIATELY:  *FEVER GREATER THAN 100.5 F  *CHILLS WITH OR WITHOUT FEVER  NAUSEA AND VOMITING THAT IS NOT CONTROLLED WITH YOUR NAUSEA MEDICATION  *UNUSUAL SHORTNESS OF BREATH  *UNUSUAL BRUISING OR BLEEDING  TENDERNESS IN MOUTH AND THROAT WITH OR WITHOUT PRESENCE OF ULCERS  *URINARY PROBLEMS  *BOWEL PROBLEMS  UNUSUAL RASH Items with * indicate a potential emergency and should be followed up as soon as possible.  Feel free to call the clinic you have any questions or concerns. The clinic phone number is [phone].

## 2014-09-01 NOTE — Telephone Encounter (Signed)
Called [facility] pharmacy to see about a refill on Duoneb rx. Pt had one refill left. Medicare was not covering this rx. Pt was using discount card, pt states she was not going to pay 51 dollar again or this prescription. Spoke with the pharmacist who checked through pt part d plan and sent a prior authorization request to see if pt prescription is covered through part d for the prescription. Informed the patient of this process and that someone from the cancer center should call and inform the pt if this rx was accepted. Prior Nancy Hodges was received via fax, placed in [place] box.

## 2014-09-01 NOTE — Progress Notes (Signed)
[place] [phone]   Fax:(336) [phone]  OFFICE PROGRESS NOTE  Nancy Fairy, PA-C [address] [place] [address]  DIAGNOSIS: Metastatic non-small cell lung cancer, adenocarcinoma. This was initially diagnosed as stage IB (T2a, N0, M0) In March of 2014, status post right upper lobectomy with lymph node dissection but the patient has evidence for disease recurrence in the right axilla status post resection.  PRIOR THERAPY: Status post right axillary lymph node biopsy on 05/01/2014 under the care of Dr. Roxan Hockey  CURRENT THERAPY: Systemic chemotherapy with carboplatin for AUC of 5 and Alimta 500 mg/M2 every 3 weeks. First cycle 05/31/2014. Status post 4 cycles.  INTERVAL HISTORY: Nancy Hodges 69 y.o. female returns to the clinic today for followup visit. The patient is currently undergoing systemic chemotherapy with carboplatin and Alimta status post 4 cycles and tolerating her treatment fairly well She denied having any significant chest pain, shortness of breath, cough or hemoptysis. She has no weight loss or night sweats. She denied having any significant nausea or vomiting, no fever or chills. She has been started on Januvia for her hyperglycemia. She reports that she gets her weekly labs done at [facility]. She is here today to start cycle #5.She is having trouble getting her DuoNeb filled. Further investigation revealed she requires a prior authorization for this medication. We will assist her with this issue.  MEDICAL HISTORY: Past Medical History  Diagnosis Date  . Asthma   . History of sinusitis   . Allergic rhinitis   . History of kidney stones   . 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 H/O hiatal hernia   . GERD (gastroesophageal reflux disease)   . Pulmonary nodule   . Paroxysmal atrial fibrillation     CHADS score: 0  . Atrial flutter   . Tachycardia-bradycardia syndrome     a. s/p Nanostim  Leadless pacemaker 09/2012.  [name] Anxiety   . Headache(784.0)     sinus  . Diverticulosis   . Internal hemorrhoid   . Dysrhythmia   . Pneumonia June 2014  . Arthritis   . Thin skin   . Bruises easily     patient on Xarelto  . Lung cancer     a. Stage IB non-small cell carcinoma, s/p R VATS, wedge resection, RU lobectomy 10/2012.    ALLERGIES:  is allergic to prednisone; septra; penicillins; and vioxx.  MEDICATIONS:  Current Outpatient Prescriptions  Medication Sig Dispense Refill  . acetaminophen (TYLENOL) 500 MG tablet Take 500 mg by mouth every 6 (six) hours as needed for moderate pain or fever.     [name] albuterol (PROVENTIL HFA;VENTOLIN HFA) 108 (90 BASE) MCG/ACT inhaler Inhale 1-2 puffs into the lungs every 6 (six) hours as needed for wheezing or shortness of breath.    [name] albuterol (PROVENTIL) (2.5 MG/3ML) 0.083% nebulizer solution Take 3 mLs (2.5 mg total) by nebulization every 2 (two) hours as needed for shortness of breath. 120 mL 0  . amiodarone (PACERONE) 100 MG tablet Take 1 tablet (100 mg total) by mouth daily. 30 tablet 11  . dexamethasone (DECADRON) 4 MG tablet 4 mg po bid the day before, day of and day after chemo (Patient taking differently: Take 4 mg by mouth 2 (two) times daily. the day before, day of and day after chemo) 40 tablet 1  . diltiazem (CARDIZEM CD) 240 MG 24 hr capsule Take 1 capsule (240 mg total) by mouth  daily. 30 capsule 10  . fluticasone (FLONASE) 50 MCG/ACT nasal spray Place 2 sprays into the nose daily as needed (for congestion).    . folic acid (FOLVITE) 1 MG tablet Take 1 tablet (1 mg total) by mouth daily. 30 tablet 4  . ipratropium-albuterol (DUONEB) 0.5-2.5 (3) MG/3ML SOLN Take 3 mLs by nebulization every 6 (six) hours as needed. 360 mL 1  . JANUVIA 50 MG tablet   3  . PRESCRIPTION MEDICATION Chemo - [facility]    . rivaroxaban (XARELTO) 20 MG TABS tablet Take 1 tablet (20 mg total) by mouth daily. (Patient taking differently: Take 20 mg by mouth daily with  supper. ) 30 tablet 11  . traMADol (ULTRAM) 50 MG tablet Take 1-2 tablets (50-100 mg total) by mouth every 6 (six) hours as needed (pain). 30 tablet 0  . guaiFENesin-dextromethorphan (ROBITUSSIN DM) 100-10 MG/5ML syrup Take 5 mLs by mouth 3 (three) times daily as needed for cough. (Patient not taking: Reported on 08/11/2014) 118 mL 0  . prochlorperazine (COMPAZINE) 10 MG tablet Take 1 tablet (10 mg total) by mouth every 6 (six) hours as needed for nausea or vomiting. (Patient not taking: Reported on 08/11/2014) 30 tablet 0   No current facility-administered medications for this visit.    SURGICAL HISTORY:  Past Surgical History  Procedure Laterality Date  . Partial hysterectomy    . Pacemaker insertion  09/14/2012    Nanostim (SJM) leadless pacemaker (LEADLESS II STUDY PATIENT) implanted by Dr [name]  . Video assisted thoracoscopy (vats)/wedge resection Right 10/27/2012    Procedure: VIDEO ASSISTED THORACOSCOPY (VATS),RGHT UPPER LOBE LUNG WEDGE RESECTION;  Surgeon: [name], MD;  Location: [place];  Service: Thoracic;  Laterality: Right;  . Lobectomy Right 10/27/2012    Procedure: LOBECTOMY;  Surgeon: [name], MD;  Location: [place];  Service: Thoracic;  Laterality: Right;   RIGHT UPPER LOBECTOMY & Node Dissection  . Abdominal hysterectomy    . Insert / replace / remove pacemaker      St. Jude  . Colonoscopy w/ polypectomy  2015  . Lymph node biopsy Right 05/01/2014    Procedure: LYMPH NODE BIOPSY, Right Axillary;  Surgeon: [name], MD;  Location: [place];  Service: Thoracic;  Laterality: Right;  . Permanent pacemaker insertion N/A 09/14/2012    Procedure: PERMANENT PACEMAKER INSERTION;  Surgeon: [name], MD;  Location: [facility] CATH LAB;  Service: Cardiovascular;  Laterality: N/A;    REVIEW OF SYSTEMS:  Constitutional: negative Eyes: negative Ears, nose, mouth, throat, and face: negative Respiratory: negative Cardiovascular: negative Gastrointestinal:  negative Genitourinary:negative Integument/breast: negative Hematologic/lymphatic: negative Musculoskeletal:negative Neurological: negative Behavioral/Psych: negative Endocrine: negative Allergic/Immunologic: negative   PHYSICAL EXAMINATION: General appearance: alert, cooperative and no distress Head: Normocephalic, without obvious abnormality, atraumatic Neck: no adenopathy, no JVD, supple, symmetrical, trachea midline and thyroid not enlarged, symmetric, no tenderness/mass/nodules Lymph nodes: Cervical, supraclavicular, and axillary nodes normal. Resp: clear to auscultation bilaterally Back: symmetric, no curvature. ROM normal. No CVA tenderness. Cardio: regular rate and rhythm, S1, S2 normal, no murmur, click, rub or gallop GI: soft, non-tender; bowel sounds normal; no masses,  no organomegaly Extremities: extremities normal, atraumatic, no cyanosis or edema Neurologic: Alert and oriented X 3, normal strength and tone. Normal symmetric reflexes. Normal coordination and gait  ECOG PERFORMANCE STATUS: 1 - Symptomatic but completely ambulatory  There were no vitals taken for this visit.  LABORATORY DATA: Lab Results  Component Value Date   WBC 4.1 09/01/2014   HGB 11.1* 09/01/2014  HCT 34.2* 09/01/2014   MCV 88.4 09/01/2014   PLT 289 09/01/2014      Chemistry      Component Value Date/Time   NA 139 09/01/2014 0857   NA 138 08/23/2014 1150   K 4.3 09/01/2014 0857   K 3.6 08/23/2014 1150   CL 104 08/23/2014 1150   CO2 22 09/01/2014 0857   CO2 28 08/23/2014 1150   BUN 9.3 09/01/2014 0857   BUN 7 08/23/2014 1150   CREATININE 0.7 09/01/2014 0857   CREATININE 0.70 08/23/2014 1150      Component Value Date/Time   CALCIUM 10.0 09/01/2014 0857   CALCIUM 9.1 08/23/2014 1150   ALKPHOS 131 09/01/2014 0857   ALKPHOS 110 08/23/2014 1150   AST 36* 09/01/2014 0857   AST 42* 08/23/2014 1150   ALT 33 09/01/2014 0857   ALT 41* 08/23/2014 1150   BILITOT 0.96 09/01/2014 0857    BILITOT 1.0 08/23/2014 1150       RADIOGRAPHIC STUDIES: No results found. ASSESSMENT AND PLAN: This is a very pleasant 69 years old white female recently diagnosed with metastatic non-small cell lung cancer, adenocarcinoma with negative EGFR mutation and negative ALK gene translocation. This was initially diagnosed as stage IB status post right upper lobectomy but the patient had evidence for disease recurrence in the right axilla status post resection. She is currently undergoing systemic chemotherapy with carboplatin and Alimta status post 4 cycles and to rating her treatment well.  Her recent restaging CT scan revealed no evidence for recurrence or progressive disease. She'll proceed with cycle #5 of her systemic chemotherapy with carboplatin and Alimta as scheduled today. She will continue with her weekly labs drawn at [facility]. She'll follow-up in 3 weeks prior to cycle #6  She was advised to call immediately if she has any concerning symptoms in the interval. The patient voices understanding of current disease status and treatment options and is in agreement with the current care plan.  All questions were answered. The patient knows to call the clinic with any problems, questions or concerns. We can certainly see the patient much sooner if necessary.  Carlton Adam, PA-C 09/01/2014

## 2014-09-01 NOTE — Progress Notes (Signed)
Per Awilda Metro PA, she will decrease Decadron today from 20mg  to 5mg  due to blood sugar of 252. Per PA, pt to return to clinic hospital if blood sugar is above 400 this weekend. Pt aware and verbalizes understanding of same.

## 2014-09-01 NOTE — Telephone Encounter (Signed)
added appt per pof.Marland KitchenMarland Kitchenpt will get sched from tx.

## 2014-09-04 NOTE — Patient Instructions (Signed)
Continue weekly labs as scheduled Follow up in 3 weeks, prior to your next scheduled cycle of chemotherapy

## 2014-09-05 ENCOUNTER — Encounter: Payer: Self-pay | Admitting: *Deleted

## 2014-09-05 NOTE — Progress Notes (Signed)
[facility] Healthcare Advance Directives Clinical Social Work  Clinical Social Work was referred by patient to review and complete healthcare advance directives.  Clinical Social Worker met with patient in infusion room.  The patient designated daughter Teresa as their primary healthcare agent and no secondary agent.  Patient also completed healthcare living will.    Clinical Social Worker notarized documents and made copies for patient/family. Clinical Social Worker will send documents to medical records to be scanned into patient's chart. Clinical Social Worker encouraged patient/family to contact with any additional questions or concerns.  Lauren Mullis, MSW, LCSW Clinical Social Worker [facility] [phone]        

## 2014-09-08 ENCOUNTER — Encounter (HOSPITAL_COMMUNITY): Payer: Medicare Other | Attending: Internal Medicine

## 2014-09-08 DIAGNOSIS — C3491 Malignant neoplasm of unspecified part of right bronchus or lung: Secondary | ICD-10-CM | POA: Insufficient documentation

## 2014-09-08 DIAGNOSIS — C3411 Malignant neoplasm of upper lobe, right bronchus or lung: Secondary | ICD-10-CM

## 2014-09-08 LAB — CBC WITH DIFFERENTIAL/PLATELET
Basophils Absolute: 0 10*3/uL (ref 0.0–0.1)
Basophils Relative: 0 % (ref 0–1)
EOS ABS: 0 10*3/uL (ref 0.0–0.7)
Eosinophils Relative: 1 % (ref 0–5)
HCT: 34.6 % — ABNORMAL LOW (ref 36.0–46.0)
Hemoglobin: 11.2 g/dL — ABNORMAL LOW (ref 12.0–15.0)
LYMPHS PCT: 48 % — AB (ref 12–46)
Lymphs Abs: 1.3 10*3/uL (ref 0.7–4.0)
MCH: 29 pg (ref 26.0–34.0)
MCHC: 32.4 g/dL (ref 30.0–36.0)
MCV: 89.6 fL (ref 78.0–100.0)
MONO ABS: 0.3 10*3/uL (ref 0.1–1.0)
Monocytes Relative: 11 % (ref 3–12)
NEUTROS ABS: 1.1 10*3/uL — AB (ref 1.7–7.7)
Neutrophils Relative %: 40 % — ABNORMAL LOW (ref 43–77)
Platelets: 140 10*3/uL — ABNORMAL LOW (ref 150–400)
RBC: 3.86 MIL/uL — ABNORMAL LOW (ref 3.87–5.11)
RDW: 18.7 % — ABNORMAL HIGH (ref 11.5–15.5)
WBC: 2.7 10*3/uL — ABNORMAL LOW (ref 4.0–10.5)

## 2014-09-08 LAB — COMPREHENSIVE METABOLIC PANEL
ALT: 38 U/L — ABNORMAL HIGH (ref 0–35)
AST: 41 U/L — ABNORMAL HIGH (ref 0–37)
Albumin: 3.9 g/dL (ref 3.5–5.2)
Alkaline Phosphatase: 104 U/L (ref 39–117)
BUN: 13 mg/dL (ref 6–23)
CO2: 27 mmol/L (ref 19–32)
Calcium: 9.3 mg/dL (ref 8.4–10.5)
Chloride: 106 mmol/L (ref 96–112)
Creatinine, Ser: 0.57 mg/dL (ref 0.50–1.10)
GFR calc non Af Amer: >90 mL/min (ref >90)
Glucose, Bld: 111 mg/dL — ABNORMAL HIGH (ref 70–99)
Potassium: 4.1 mmol/L (ref 3.5–5.1)
SODIUM: 135 mmol/L (ref 135–145)
Total Bilirubin: 0.9 mg/dL (ref 0.3–1.2)
Total Protein: 7.1 g/dL (ref 6.0–8.3)

## 2014-09-08 NOTE — Progress Notes (Signed)
Labs for cmp,cbcd

## 2014-09-15 ENCOUNTER — Encounter (HOSPITAL_COMMUNITY): Payer: Medicare Other

## 2014-09-15 DIAGNOSIS — C3491 Malignant neoplasm of unspecified part of right bronchus or lung: Secondary | ICD-10-CM | POA: Diagnosis not present

## 2014-09-15 LAB — CBC WITH DIFFERENTIAL/PLATELET
Basophils Absolute: 0 10*3/uL (ref 0.0–0.1)
Basophils Relative: 0 % (ref 0–1)
Eosinophils Absolute: 0 10*3/uL (ref 0.0–0.7)
Eosinophils Relative: 0 % (ref 0–5)
HCT: 27.7 % — ABNORMAL LOW (ref 36.0–46.0)
Hemoglobin: 9 g/dL — ABNORMAL LOW (ref 12.0–15.0)
LYMPHS ABS: 0.9 10*3/uL (ref 0.7–4.0)
LYMPHS PCT: 31 % (ref 12–46)
MCH: 29.6 pg (ref 26.0–34.0)
MCHC: 32.5 g/dL (ref 30.0–36.0)
MCV: 91.1 fL (ref 78.0–100.0)
MONOS PCT: 10 % (ref 3–12)
Monocytes Absolute: 0.3 10*3/uL (ref 0.1–1.0)
NEUTROS ABS: 1.7 10*3/uL (ref 1.7–7.7)
NEUTROS PCT: 59 % (ref 43–77)
PLATELETS: 55 10*3/uL — AB (ref 150–400)
RBC: 3.04 MIL/uL — AB (ref 3.87–5.11)
RDW: 19.1 % — ABNORMAL HIGH (ref 11.5–15.5)
WBC: 3 10*3/uL — AB (ref 4.0–10.5)

## 2014-09-15 LAB — COMPREHENSIVE METABOLIC PANEL
ALK PHOS: 119 U/L — AB (ref 39–117)
ALT: 40 U/L — ABNORMAL HIGH (ref 0–35)
ANION GAP: 5 (ref 5–15)
AST: 47 U/L — ABNORMAL HIGH (ref 0–37)
Albumin: 3.8 g/dL (ref 3.5–5.2)
BILIRUBIN TOTAL: 1.2 mg/dL (ref 0.3–1.2)
BUN: 9 mg/dL (ref 6–23)
CHLORIDE: 108 mmol/L (ref 96–112)
CO2: 25 mmol/L (ref 19–32)
Calcium: 8.9 mg/dL (ref 8.4–10.5)
Creatinine, Ser: 0.53 mg/dL (ref 0.50–1.10)
GFR calc non Af Amer: >90 mL/min (ref >90)
GLUCOSE: 127 mg/dL — AB (ref 70–99)
POTASSIUM: 3.7 mmol/L (ref 3.5–5.1)
SODIUM: 138 mmol/L (ref 135–145)
TOTAL PROTEIN: 6.7 g/dL (ref 6.0–8.3)

## 2014-09-15 NOTE — Progress Notes (Signed)
LABS FOR CBCD,CMP

## 2014-09-22 ENCOUNTER — Ambulatory Visit (HOSPITAL_BASED_OUTPATIENT_CLINIC_OR_DEPARTMENT_OTHER): Payer: Medicare Other | Admitting: Physician Assistant

## 2014-09-22 ENCOUNTER — Encounter: Payer: Self-pay | Admitting: Oncology

## 2014-09-22 ENCOUNTER — Ambulatory Visit (HOSPITAL_BASED_OUTPATIENT_CLINIC_OR_DEPARTMENT_OTHER): Payer: Medicare Other

## 2014-09-22 ENCOUNTER — Other Ambulatory Visit (HOSPITAL_BASED_OUTPATIENT_CLINIC_OR_DEPARTMENT_OTHER): Payer: Medicare Other

## 2014-09-22 ENCOUNTER — Encounter: Payer: Self-pay | Admitting: Physician Assistant

## 2014-09-22 ENCOUNTER — Telehealth: Payer: Self-pay | Admitting: Internal Medicine

## 2014-09-22 VITALS — BP 147/57 | HR 80 | Temp 98.1°F | Resp 18 | Ht 62.0 in | Wt 143.7 lb

## 2014-09-22 DIAGNOSIS — C3491 Malignant neoplasm of unspecified part of right bronchus or lung: Secondary | ICD-10-CM

## 2014-09-22 DIAGNOSIS — C3411 Malignant neoplasm of upper lobe, right bronchus or lung: Secondary | ICD-10-CM

## 2014-09-22 DIAGNOSIS — Z5111 Encounter for antineoplastic chemotherapy: Secondary | ICD-10-CM

## 2014-09-22 DIAGNOSIS — C773 Secondary and unspecified malignant neoplasm of axilla and upper limb lymph nodes: Secondary | ICD-10-CM

## 2014-09-22 DIAGNOSIS — C3490 Malignant neoplasm of unspecified part of unspecified bronchus or lung: Secondary | ICD-10-CM

## 2014-09-22 DIAGNOSIS — R739 Hyperglycemia, unspecified: Secondary | ICD-10-CM

## 2014-09-22 LAB — COMPREHENSIVE METABOLIC PANEL (CC13)
ALBUMIN: 3.7 g/dL (ref 3.5–5.0)
ALT: 27 U/L (ref 0–55)
ANION GAP: 12 meq/L — AB (ref 3–11)
AST: 38 U/L — AB (ref 5–34)
Alkaline Phosphatase: 144 U/L (ref 40–150)
BUN: 8.6 mg/dL (ref 7.0–26.0)
CO2: 20 meq/L — AB (ref 22–29)
Calcium: 10.2 mg/dL (ref 8.4–10.4)
Chloride: 106 mEq/L (ref 98–109)
Creatinine: 0.8 mg/dL (ref 0.6–1.1)
EGFR: 80 mL/min/{1.73_m2} — ABNORMAL LOW (ref >90)
Glucose: 315 mg/dl — ABNORMAL HIGH (ref 70–140)
Potassium: 4.7 mEq/L (ref 3.5–5.1)
SODIUM: 137 meq/L (ref 136–145)
Total Bilirubin: 1.18 mg/dL (ref 0.20–1.20)
Total Protein: 7.1 g/dL (ref 6.4–8.3)

## 2014-09-22 LAB — CBC WITH DIFFERENTIAL/PLATELET
BASO%: 0.5 % (ref 0.0–2.0)
Basophils Absolute: 0 10*3/uL (ref 0.0–0.1)
EOS ABS: 0 10*3/uL (ref 0.0–0.5)
EOS%: 0.1 % (ref 0.0–7.0)
HCT: 31.9 % — ABNORMAL LOW (ref 34.8–46.6)
HGB: 10.4 g/dL — ABNORMAL LOW (ref 11.6–15.9)
LYMPH%: 12.7 % — ABNORMAL LOW (ref 14.0–49.7)
MCH: 30 pg (ref 25.1–34.0)
MCHC: 32.5 g/dL (ref 31.5–36.0)
MCV: 92.3 fL (ref 79.5–101.0)
MONO#: 0.2 10*3/uL (ref 0.1–0.9)
MONO%: 6.3 % (ref 0.0–14.0)
NEUT#: 2.6 10*3/uL (ref 1.5–6.5)
NEUT%: 80.4 % — ABNORMAL HIGH (ref 38.4–76.8)
Platelets: 247 10*3/uL (ref 145–400)
RBC: 3.46 10*6/uL — AB (ref 3.70–5.45)
RDW: 25.1 % — AB (ref 11.2–14.5)
WBC: 3.2 10*3/uL — ABNORMAL LOW (ref 3.9–10.3)
lymph#: 0.4 10*3/uL — ABNORMAL LOW (ref 0.9–3.3)

## 2014-09-22 MED ORDER — DEXAMETHASONE SODIUM PHOSPHATE 10 MG/ML IJ SOLN
5.0000 mg | Freq: Once | INTRAMUSCULAR | Status: AC
  Administered 2014-09-22: 5 mg via INTRAVENOUS

## 2014-09-22 MED ORDER — CYANOCOBALAMIN 1000 MCG/ML IJ SOLN
INTRAMUSCULAR | Status: AC
  Filled 2014-09-22: qty 1

## 2014-09-22 MED ORDER — SODIUM CHLORIDE 0.9 % IV SOLN
500.0000 mg/m2 | Freq: Once | INTRAVENOUS | Status: AC
  Administered 2014-09-22: 850 mg via INTRAVENOUS
  Filled 2014-09-22: qty 34

## 2014-09-22 MED ORDER — HEPARIN SOD (PORK) LOCK FLUSH 100 UNIT/ML IV SOLN
500.0000 [IU] | Freq: Once | INTRAVENOUS | Status: DC | PRN
  Filled 2014-09-22: qty 5

## 2014-09-22 MED ORDER — DEXAMETHASONE SODIUM PHOSPHATE 10 MG/ML IJ SOLN
INTRAMUSCULAR | Status: AC
Start: 2014-09-22 — End: 2014-09-22
  Filled 2014-09-22: qty 1

## 2014-09-22 MED ORDER — ONDANSETRON 16 MG/50ML IVPB (CHCC)
INTRAVENOUS | Status: AC
Start: 2014-09-22 — End: 2014-09-22
  Filled 2014-09-22: qty 16

## 2014-09-22 MED ORDER — FOLIC ACID 1 MG PO TABS: 1.0000 mg | ORAL_TABLET | Freq: Every day | ORAL | Status: DC

## 2014-09-22 MED ORDER — CYANOCOBALAMIN 1000 MCG/ML IJ SOLN
1000.0000 ug | Freq: Once | INTRAMUSCULAR | Status: AC
  Administered 2014-09-22: 1000 ug via INTRAMUSCULAR

## 2014-09-22 MED ORDER — SODIUM CHLORIDE 0.9 % IV SOLN
Freq: Once | INTRAVENOUS | Status: AC
  Administered 2014-09-22: 11:00:00 via INTRAVENOUS

## 2014-09-22 MED ORDER — ONDANSETRON 16 MG/50ML IVPB (CHCC)
16.0000 mg | Freq: Once | INTRAVENOUS | Status: AC
  Administered 2014-09-22: 16 mg via INTRAVENOUS

## 2014-09-22 MED ORDER — SODIUM CHLORIDE 0.9 % IV SOLN
400.0000 mg | Freq: Once | INTRAVENOUS | Status: AC
  Administered 2014-09-22: 400 mg via INTRAVENOUS
  Filled 2014-09-22: qty 40

## 2014-09-22 MED ORDER — SODIUM CHLORIDE 0.9 % IJ SOLN
10.0000 mL | INTRAMUSCULAR | Status: DC | PRN
  Filled 2014-09-22: qty 10

## 2014-09-22 NOTE — Progress Notes (Signed)
[place] Telephone:(336) [phone]   Fax:(336) [phone]  OFFICE PROGRESS NOTE  [name], PA-C [address] [place] [address]  DIAGNOSIS: Metastatic non-small cell lung cancer, adenocarcinoma. This was initially diagnosed as stage IB (T2a, N0, M0) In March of 2014, status post right upper lobectomy with lymph node dissection but the patient has evidence for disease recurrence in the right axilla status post resection.  PRIOR THERAPY: Status post right axillary lymph node biopsy on 05/01/2014 under the care of Dr. Roxan Hockey  CURRENT THERAPY: Systemic chemotherapy with carboplatin for AUC of 5 and Alimta 500 mg/M2 every 3 weeks. First cycle 05/31/2014. Status post 5 cycles.  INTERVAL HISTORY: Nancy Hodges 69 y.o. female returns to the clinic today for followup visit. The patient is currently undergoing systemic chemotherapy with carboplatin and Alimta status post 5 cycles and tolerating her treatment fairly well She denied having any significant chest pain, shortness of breath, cough or hemoptysis. She has no weight loss or night sweats. She denied having any significant nausea or vomiting, no fever or chills. She has been started on Januvia for her hyperglycemia. She reports that she gets her weekly labs done at [facility]. She is here today to start cycle #6. She requests a refill for her folic acid.  MEDICAL HISTORY: Past Medical History  Diagnosis Date  . Asthma   . History of sinusitis   . Allergic rhinitis   . History of kidney stones   . 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 H/O hiatal hernia   . GERD (gastroesophageal reflux disease)   . Pulmonary nodule   . Paroxysmal atrial fibrillation     CHADS score: 0  . Atrial flutter   . Tachycardia-bradycardia syndrome     a. 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  [name] Anxiety   . Headache(784.0)     sinus  . Diverticulosis   . Internal hemorrhoid   . Dysrhythmia   .  Pneumonia June 2014  . Arthritis   . Thin skin   . Bruises easily     patient on Xarelto  . Lung cancer     a. Stage IB non-small cell carcinoma, s/p R VATS, wedge resection, RU lobectomy 10/2012.    ALLERGIES:  is allergic to prednisone; septra; penicillins; and vioxx.  MEDICATIONS:  Current Outpatient Prescriptions  Medication Sig Dispense Refill  . acetaminophen (TYLENOL) 500 MG tablet Take 500 mg by mouth every 6 (six) hours as needed for moderate pain or fever.     [name] albuterol (PROVENTIL HFA;VENTOLIN HFA) 108 (90 BASE) MCG/ACT inhaler Inhale 1-2 puffs into the lungs every 6 (six) hours as needed for wheezing or shortness of breath.    [name] albuterol (PROVENTIL) (2.5 MG/3ML) 0.083% nebulizer solution Take 3 mLs (2.5 mg total) by nebulization every 2 (two) hours as needed for shortness of breath. 120 mL 0  . amiodarone (PACERONE) 100 MG tablet Take 1 tablet (100 mg total) by mouth daily. 30 tablet 11  . dexamethasone (DECADRON) 4 MG tablet 4 mg po bid the day before, day of and day after chemo (Patient taking differently: Take 4 mg by mouth 2 (two) times daily. the day before, day of and day after chemo) 40 tablet 1  . diltiazem (CARDIZEM CD) 240 MG 24 hr capsule Take 1 capsule (240 mg total) by mouth daily. 30 capsule 10  . fluticasone (FLONASE) 50 MCG/ACT nasal spray Place 2 sprays into the  nose daily as needed (for congestion).    . folic acid (FOLVITE) 1 MG tablet Take 1 tablet (1 mg total) by mouth daily. 30 tablet 4  . guaiFENesin-dextromethorphan (ROBITUSSIN DM) 100-10 MG/5ML syrup Take 5 mLs by mouth 3 (three) times daily as needed for cough. 118 mL 0  . ipratropium-albuterol (DUONEB) 0.5-2.5 (3) MG/3ML SOLN Take 3 mLs by nebulization every 6 (six) hours as needed. 360 mL 1  . JANUVIA 50 MG tablet   3  . PRESCRIPTION MEDICATION Chemo - [facility]    . prochlorperazine (COMPAZINE) 10 MG tablet Take 1 tablet (10 mg total) by mouth every 6 (six) hours as needed for nausea or vomiting. 30  tablet 0  . rivaroxaban (XARELTO) 20 MG TABS tablet Take 1 tablet (20 mg total) by mouth daily. (Patient taking differently: Take 20 mg by mouth daily with supper. ) 30 tablet 11  . traMADol (ULTRAM) 50 MG tablet Take 1-2 tablets (50-100 mg total) by mouth every 6 (six) hours as needed (pain). 30 tablet 0   No current facility-administered medications for this visit.   Facility-Administered Medications Ordered in Other Visits  Medication Dose Route Frequency Provider Last Rate Last Dose  . heparin lock flush 100 unit/mL  500 Units Intracatheter Once PRN Mariya Mottley E Jaeleen Inzunza, PA-C      . sodium chloride 0.9 % injection 10 mL  10 mL Intracatheter PRN Carlton Adam, PA-C        SURGICAL HISTORY:  Past Surgical History  Procedure Laterality Date  . Partial hysterectomy    . Pacemaker insertion  09/14/2012    Nanostim (SJM) leadless pacemaker (LEADLESS II STUDY PATIENT) implanted by Dr [name]  . Video assisted thoracoscopy (vats)/wedge resection Right 10/27/2012    Procedure: VIDEO ASSISTED THORACOSCOPY (VATS),RGHT UPPER LOBE LUNG WEDGE RESECTION;  Surgeon: [name], MD;  Location: [place];  Service: Thoracic;  Laterality: Right;  . Lobectomy Right 10/27/2012    Procedure: LOBECTOMY;  Surgeon: [name], MD;  Location: [place];  Service: Thoracic;  Laterality: Right;   RIGHT UPPER LOBECTOMY & Node Dissection  . Abdominal hysterectomy    . Insert / replace / remove pacemaker      St. Jude  . Colonoscopy w/ polypectomy  2015  . Lymph node biopsy Right 05/01/2014    Procedure: LYMPH NODE BIOPSY, Right Axillary;  Surgeon: [name], MD;  Location: [place];  Service: Thoracic;  Laterality: Right;  . Permanent pacemaker insertion N/A 09/14/2012    Procedure: PERMANENT PACEMAKER INSERTION;  Surgeon: [name], MD;  Location: [facility] CATH LAB;  Service: Cardiovascular;  Laterality: N/A;    REVIEW OF SYSTEMS:  Constitutional: negative Eyes: negative Ears, nose, mouth, throat,  and face: negative Respiratory: negative Cardiovascular: negative Gastrointestinal: negative Genitourinary:negative Integument/breast: negative Hematologic/lymphatic: negative Musculoskeletal:negative Neurological: negative Behavioral/Psych: negative Endocrine: negative Allergic/Immunologic: negative   PHYSICAL EXAMINATION: General appearance: alert, cooperative and no distress Head: Normocephalic, without obvious abnormality, atraumatic Neck: no adenopathy, no JVD, supple, symmetrical, trachea midline and thyroid not enlarged, symmetric, no tenderness/mass/nodules Lymph nodes: Cervical, supraclavicular, and axillary nodes normal. Resp: clear to auscultation bilaterally Back: symmetric, no curvature. ROM normal. No CVA tenderness. Cardio: regular rate and rhythm, S1, S2 normal, no murmur, click, rub or gallop GI: soft, non-tender; bowel sounds normal; no masses,  no organomegaly Extremities: extremities normal, atraumatic, no cyanosis or edema Neurologic: Alert and oriented X 3, normal strength and tone. Normal symmetric reflexes. Normal coordination and gait  ECOG PERFORMANCE STATUS: 1 -  Symptomatic but completely ambulatory  Blood pressure 147/57, pulse 80, temperature 98.1 F (36.7 C), temperature source Oral, resp. rate 18, height 5' 2"  (1.575 m), weight 143 lb 11.2 oz (65.182 kg), SpO2 98 %.  LABORATORY DATA: Lab Results  Component Value Date   WBC 3.2* 09/22/2014   HGB 10.4* 09/22/2014   HCT 31.9* 09/22/2014   MCV 92.3 09/22/2014   PLT 247 09/22/2014      Chemistry      Component Value Date/Time   NA 137 09/22/2014 0858   NA 138 09/15/2014 0908   K 4.7 09/22/2014 0858   K 3.7 09/15/2014 0908   CL 108 09/15/2014 0908   CO2 20* 09/22/2014 0858   CO2 25 09/15/2014 0908   BUN 8.6 09/22/2014 0858   BUN 9 09/15/2014 0908   CREATININE 0.8 09/22/2014 0858   CREATININE 0.53 09/15/2014 0908      Component Value Date/Time   CALCIUM 10.2 09/22/2014 0858   CALCIUM 8.9  09/15/2014 0908   ALKPHOS 144 09/22/2014 0858   ALKPHOS 119* 09/15/2014 0908   AST 38* 09/22/2014 0858   AST 47* 09/15/2014 0908   ALT 27 09/22/2014 0858   ALT 40* 09/15/2014 0908   BILITOT 1.18 09/22/2014 0858   BILITOT 1.2 09/15/2014 0908       RADIOGRAPHIC STUDIES: No results found. ASSESSMENT AND PLAN: This is a very pleasant 69 years old white female recently diagnosed with metastatic non-small cell lung cancer, adenocarcinoma with negative EGFR mutation and negative ALK gene translocation. This was initially diagnosed as stage IB status post right upper lobectomy but the patient had evidence for disease recurrence in the right axilla status post resection. She is currently undergoing systemic chemotherapy with carboplatin and Alimta status post 5 cycles and to rating her treatment well.  Her last restaging CT scan revealed no evidence for recurrence or progressive disease. She'll proceed with cycle #6 of her systemic chemotherapy with carboplatin and Alimta as scheduled today. She will continue with her weekly labs drawn at [facility]. She'll follow-up in 3 weeks with a restaging CT scan of her chest, abdomen and pelvis with contrast to re-evaluate her disease. A refill for her folic acid was sent her pharmacy of record via E scribe.  She was advised to call immediately if she has any concerning symptoms in the interval. The patient voices understanding of current disease status and treatment options and is in agreement with the current care plan.  All questions were answered. The patient knows to call the clinic with any problems, questions or concerns. We can certainly see the patient much sooner if necessary.  Carlton Adam, PA-C 09/22/2014

## 2014-09-22 NOTE — Patient Instructions (Signed)
[place] Discharge Instructions for Patients Receiving Chemotherapy  Today you received the following chemotherapy agents alimta/carboplatin  To help prevent nausea and vomiting after your treatment, we encourage you to take your nausea medication as directed   If you develop nausea and vomiting that is not controlled by your nausea medication, call the clinic.   BELOW ARE SYMPTOMS THAT SHOULD BE REPORTED IMMEDIATELY:  *FEVER GREATER THAN 100.5 F  *CHILLS WITH OR WITHOUT FEVER  NAUSEA AND VOMITING THAT IS NOT CONTROLLED WITH YOUR NAUSEA MEDICATION  *UNUSUAL SHORTNESS OF BREATH  *UNUSUAL BRUISING OR BLEEDING  TENDERNESS IN MOUTH AND THROAT WITH OR WITHOUT PRESENCE OF ULCERS  *URINARY PROBLEMS  *BOWEL PROBLEMS  UNUSUAL RASH Items with * indicate a potential emergency and should be followed up as soon as possible.  Feel free to call the clinic you have any questions or concerns. The clinic phone number is [phone].

## 2014-09-22 NOTE — Telephone Encounter (Signed)
gv and printed appt sched and avs for pt for Feb adn March

## 2014-09-26 ENCOUNTER — Other Ambulatory Visit (HOSPITAL_COMMUNITY): Payer: Medicare Other

## 2014-09-28 ENCOUNTER — Encounter (HOSPITAL_BASED_OUTPATIENT_CLINIC_OR_DEPARTMENT_OTHER): Payer: Medicare Other

## 2014-09-28 DIAGNOSIS — C3491 Malignant neoplasm of unspecified part of right bronchus or lung: Secondary | ICD-10-CM | POA: Diagnosis not present

## 2014-09-28 LAB — COMPREHENSIVE METABOLIC PANEL
ALT: 32 U/L (ref 0–35)
AST: 34 U/L (ref 0–37)
Albumin: 3.7 g/dL (ref 3.5–5.2)
Alkaline Phosphatase: 95 U/L (ref 39–117)
BUN: 15 mg/dL (ref 6–23)
CALCIUM: 9.1 mg/dL (ref 8.4–10.5)
CO2: 28 mmol/L (ref 19–32)
CREATININE: 0.54 mg/dL (ref 0.50–1.10)
Chloride: 104 mmol/L (ref 96–112)
GFR calc Af Amer: >90 mL/min (ref >90)
GLUCOSE: 116 mg/dL — AB (ref 70–99)
Potassium: 4.2 mmol/L (ref 3.5–5.1)
Sodium: 134 mmol/L — ABNORMAL LOW (ref 135–145)
Total Bilirubin: 0.8 mg/dL (ref 0.3–1.2)
Total Protein: 6.5 g/dL (ref 6.0–8.3)

## 2014-09-28 LAB — CBC WITH DIFFERENTIAL/PLATELET
BASOS PCT: 0 % (ref 0–1)
Basophils Absolute: 0 10*3/uL (ref 0.0–0.1)
EOS ABS: 0 10*3/uL (ref 0.0–0.7)
Eosinophils Relative: 1 % (ref 0–5)
HCT: 29.3 % — ABNORMAL LOW (ref 36.0–46.0)
HEMOGLOBIN: 9.8 g/dL — AB (ref 12.0–15.0)
LYMPHS ABS: 0.9 10*3/uL (ref 0.7–4.0)
Lymphocytes Relative: 51 % — ABNORMAL HIGH (ref 12–46)
MCH: 30.7 pg (ref 26.0–34.0)
MCHC: 33.4 g/dL (ref 30.0–36.0)
MCV: 91.8 fL (ref 78.0–100.0)
MONO ABS: 0.2 10*3/uL (ref 0.1–1.0)
MONOS PCT: 11 % (ref 3–12)
NEUTROS ABS: 0.7 10*3/uL — AB (ref 1.7–7.7)
NEUTROS PCT: 37 % — AB (ref 43–77)
Platelets: 141 10*3/uL — ABNORMAL LOW (ref 150–400)
RBC: 3.19 MIL/uL — ABNORMAL LOW (ref 3.87–5.11)
RDW: 19.6 % — ABNORMAL HIGH (ref 11.5–15.5)
WBC: 1.8 10*3/uL — ABNORMAL LOW (ref 4.0–10.5)

## 2014-09-28 NOTE — Patient Instructions (Signed)
Continue weekly labs as scheduled Follow-up in 3 weeks with a restaging CT scan of your chest, abdomen and pelvis to reevaluate your disease

## 2014-09-28 NOTE — Progress Notes (Signed)
Labs for cbcd,cmp

## 2014-10-03 ENCOUNTER — Encounter (HOSPITAL_COMMUNITY): Payer: Medicare Other | Attending: Internal Medicine

## 2014-10-03 DIAGNOSIS — C3491 Malignant neoplasm of unspecified part of right bronchus or lung: Secondary | ICD-10-CM | POA: Diagnosis present

## 2014-10-03 DIAGNOSIS — C3411 Malignant neoplasm of upper lobe, right bronchus or lung: Secondary | ICD-10-CM

## 2014-10-03 LAB — COMPREHENSIVE METABOLIC PANEL
ALT: 39 U/L — ABNORMAL HIGH (ref 0–35)
AST: 48 U/L — ABNORMAL HIGH (ref 0–37)
Albumin: 3.8 g/dL (ref 3.5–5.2)
Alkaline Phosphatase: 110 U/L (ref 39–117)
Anion gap: 4 — ABNORMAL LOW (ref 5–15)
BUN: 8 mg/dL (ref 6–23)
CHLORIDE: 107 mmol/L (ref 96–112)
CO2: 28 mmol/L (ref 19–32)
Calcium: 9.2 mg/dL (ref 8.4–10.5)
Creatinine, Ser: 0.53 mg/dL (ref 0.50–1.10)
GFR calc non Af Amer: >90 mL/min (ref >90)
GLUCOSE: 115 mg/dL — AB (ref 70–99)
Potassium: 3.8 mmol/L (ref 3.5–5.1)
SODIUM: 139 mmol/L (ref 135–145)
Total Bilirubin: 1.1 mg/dL (ref 0.3–1.2)
Total Protein: 6.6 g/dL (ref 6.0–8.3)

## 2014-10-03 LAB — CBC WITH DIFFERENTIAL/PLATELET
Basophils Absolute: 0 10*3/uL (ref 0.0–0.1)
Basophils Relative: 0 % (ref 0–1)
EOS PCT: 0 % (ref 0–5)
Eosinophils Absolute: 0 10*3/uL (ref 0.0–0.7)
HEMATOCRIT: 25.2 % — AB (ref 36.0–46.0)
Hemoglobin: 8.5 g/dL — ABNORMAL LOW (ref 12.0–15.0)
Lymphocytes Relative: 41 % (ref 12–46)
Lymphs Abs: 1 10*3/uL (ref 0.7–4.0)
MCH: 30.9 pg (ref 26.0–34.0)
MCHC: 33.7 g/dL (ref 30.0–36.0)
MCV: 91.6 fL (ref 78.0–100.0)
MONO ABS: 0.4 10*3/uL (ref 0.1–1.0)
MONOS PCT: 18 % — AB (ref 3–12)
Neutro Abs: 1 10*3/uL — ABNORMAL LOW (ref 1.7–7.7)
Neutrophils Relative %: 42 % — ABNORMAL LOW (ref 43–77)
Platelets: 55 10*3/uL — ABNORMAL LOW (ref 150–400)
RBC: 2.75 MIL/uL — ABNORMAL LOW (ref 3.87–5.11)
RDW: 19.4 % — AB (ref 11.5–15.5)
WBC: 2.4 10*3/uL — AB (ref 4.0–10.5)

## 2014-10-04 ENCOUNTER — Ambulatory Visit (HOSPITAL_COMMUNITY): Payer: Medicare Other

## 2014-10-04 ENCOUNTER — Telehealth: Payer: Self-pay | Admitting: *Deleted

## 2014-10-04 ENCOUNTER — Ambulatory Visit (HOSPITAL_COMMUNITY)
Admission: RE | Admit: 2014-10-04 | Discharge: 2014-10-04 | Disposition: A | Discharge status: Home / Self Care | Payer: Medicare Other | Source: Ambulatory Visit | Attending: Physician Assistant | Admitting: Physician Assistant

## 2014-10-04 ENCOUNTER — Encounter (HOSPITAL_COMMUNITY): Payer: Self-pay

## 2014-10-04 DIAGNOSIS — C3490 Malignant neoplasm of unspecified part of unspecified bronchus or lung: Secondary | ICD-10-CM | POA: Diagnosis not present

## 2014-10-04 DIAGNOSIS — C3491 Malignant neoplasm of unspecified part of right bronchus or lung: Secondary | ICD-10-CM

## 2014-10-04 MED ORDER — IOHEXOL 300 MG/ML  SOLN
100.0000 mL | Freq: Once | INTRAMUSCULAR | Status: AC | PRN
  Administered 2014-10-04: 100 mL via INTRAVENOUS

## 2014-10-04 NOTE — Progress Notes (Signed)
Labs for cbcd,cmp

## 2014-10-04 NOTE — Telephone Encounter (Signed)
Patient called and left a vm message regarding confusion with appt.  I called her back and clarified.  She was thankful for the call.

## 2014-10-05 ENCOUNTER — Other Ambulatory Visit (HOSPITAL_COMMUNITY): Payer: Medicare Other

## 2014-10-08 ENCOUNTER — Encounter: Payer: Self-pay | Admitting: Nurse Practitioner

## 2014-10-08 NOTE — Progress Notes (Signed)
Electrophysiology Office Note Date: 10/09/2014  ID:  Nancy Hodges, Nancy Hodges 08-06-1945, MRN [id number]  PCP: [name], PA-C Electrophysiologist: Kathlee Nations is a 69 y.o. female is seen today for Dr. [name].  She presents today for routine electrophysiology followup as part of research protocol.  Since last being seen in our clinic, the patient reports doing very well. She has completed her recent course of chemotherapy and is scheduled to see Dr Inda Merlin next week for further discussions about treatment plan for lung cancer.    She has had intermittent tachypalpitations.  These have been short in duration and not increasing in frequency.   Recent labwork is notable for platelets of 55, Hgb 8.5 and slightly elevated LFT"s in the setting of chemotherapy for lung cancer.  She denies hemoptysis, BRBPR, hematuria.  She has had occasional nose bleeds which are chronic for her.    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    Past Medical History  Diagnosis Date  . Asthma   . Allergic rhinitis   . History of kidney stones   . H/O hiatal hernia   . GERD (gastroesophageal reflux disease)   . Pulmonary nodule   . Paroxysmal atrial fibrillation     a. anticoagulated with Xarelto  . Atrial flutter   . Tachycardia-bradycardia syndrome     a. 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  [name] Anxiety   . Diverticulosis   . Internal hemorrhoid   . Arthritis   . Lung cancer     a. Stage IB non-small cell carcinoma, s/p R VATS, wedge resection, RU lobectomy 10/2012.  . Diabetes mellitus without complication     steroid induced from chemo   Past Surgical History  Procedure Laterality Date  . Partial hysterectomy    . Video assisted thoracoscopy (vats)/wedge resection Right 10/27/2012    Procedure: VIDEO ASSISTED THORACOSCOPY (VATS),RGHT UPPER LOBE LUNG WEDGE RESECTION;  Surgeon: [name], MD;  Location: [facility];  Service: Thoracic;   Laterality: Right;  . Lobectomy Right 10/27/2012    Procedure: LOBECTOMY;  Surgeon: [name], MD;  Location: [facility];  Service: Thoracic;  Laterality: Right;   RIGHT UPPER LOBECTOMY & Node Dissection  . Colonoscopy with polypectomy  2015  . Lymph node biopsy Right 05/01/2014    Procedure: LYMPH NODE BIOPSY, Right Axillary;  Surgeon: [name], MD;  Location: [facility];  Service: Thoracic;  Laterality: Right;  . Permanent pacemaker insertion N/A 09/14/2012    Nanostim (SJM) leadless pacemaker (LEADLESS II STUDY PATIENT) implanted by Dr [name]    Current Outpatient Prescriptions  Medication Sig Dispense Refill  . acetaminophen (TYLENOL) 500 MG tablet Take 500 mg by mouth every 6 (six) hours as needed for moderate pain or fever.     [name] albuterol (PROVENTIL HFA;VENTOLIN HFA) 108 (90 BASE) MCG/ACT inhaler Inhale 1-2 puffs into the lungs every 6 (six) hours as needed for wheezing or shortness of breath.    [name] albuterol (PROVENTIL) (2.5 MG/3ML) 0.083% nebulizer solution Take 3 mLs (2.5 mg total) by nebulization every 2 (two) hours as needed for shortness of breath. 120 mL 0  . amiodarone (PACERONE) 100 MG tablet Take 1 tablet (100 mg total) by mouth daily. 30 tablet 11  . diltiazem (CARDIZEM CD) 240 MG 24 hr capsule Take 1 capsule (240 mg total) by mouth daily. 30 capsule 10  . fluticasone (FLONASE) 50 MCG/ACT nasal spray Place 2 sprays into the nose daily as  needed (for congestion).    . folic acid (FOLVITE) 1 MG tablet Take 1 tablet (1 mg total) by mouth daily. 30 tablet 4  . guaiFENesin-dextromethorphan (ROBITUSSIN DM) 100-10 MG/5ML syrup Take 5 mLs by mouth 3 (three) times daily as needed for cough. 118 mL 0  . ipratropium-albuterol (DUONEB) 0.5-2.5 (3) MG/3ML SOLN Take 3 mLs by nebulization every 6 (six) hours as needed. 360 mL 1  . JANUVIA 50 MG tablet Take 50 mg by mouth daily.   3  . PRESCRIPTION MEDICATION Chemo - [facility]    . prochlorperazine (COMPAZINE) 10 MG tablet Take 1 tablet  (10 mg total) by mouth every 6 (six) hours as needed for nausea or vomiting. 30 tablet 0  . rivaroxaban (XARELTO) 20 MG TABS tablet Take 1 tablet (20 mg total) by mouth daily. (Patient taking differently: Take 20 mg by mouth daily with supper. ) 30 tablet 11  . traMADol (ULTRAM) 50 MG tablet Take 1-2 tablets (50-100 mg total) by mouth every 6 (six) hours as needed (pain). 30 tablet 0  . dexamethasone (DECADRON) 4 MG tablet 4 mg po bid the day before, day of and day after chemo (Patient not taking: Reported on 10/09/2014) 40 tablet 1   No current facility-administered medications for this visit.    Allergies:   Prednisone; Septra; Penicillins; and Vioxx   Social History: History   Social History  . Marital Status: Single    Spouse Name: N/A  . Number of Children: N/A  . Years of Education: N/A   Occupational History  . Not on file.   Social History Main Topics  . Smoking status: Former Smoker -- 2.00 packs/day for 40 years    Types: Cigarettes    Quit date: 08/05/1995  . Smokeless tobacco: Never Used  . Alcohol Use: No  . Drug Use: No  . Sexual Activity: Not on file   Other Topics Concern  . Not on file   Social History Narrative   Patient lives alone in [place].   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   Patient has 1 living daughter     Family History: Family History  Problem Relation Age of Onset  . Heart disease Mother   . Cancer Mother   . Diabetes Mother   . Asthma Father   . Heart disease Father   . Cancer Brother   . Diabetes Sister   . Cancer Sister   . Diabetes Brother   . Heart attack Son     Review of Systems: General: No chills, fever, night sweats or weight changes  Cardiovascular:  No chest pain, dyspnea on exertion, edema, orthopnea,  paroxysmal nocturnal dyspnea Dermatological: + easy bruising, no rash, lesions or masses Respiratory: No cough, dyspnea Urologic: No hematuria, dysuria Abdominal: No nausea, vomiting, diarrhea, bright red  blood per rectum, melena, or hematemesis Neurologic: No visual changes, weakness, changes in mental status All other systems reviewed and are otherwise negative except as noted above.   Physical Exam: VS:  BP 122/62 mmHg  Pulse 73  Ht 5\' 2"  (1.575 m)  Wt 142 lb (64.411 kg)  BMI 25.97 kg/m2 , BMI Body mass index is 25.97 kg/(m^2).  GEN- The patient is elderly appearing, alert and oriented x 3 today.   HEENT: normocephalic, atraumatic; sclera clear, conjunctiva pink; hearing intact; oropharynx clear; neck supple, no JVP Lymph- no cervical lymphadenopathy Lungs- Clear to ausculation bilaterally, decreased right sided breath sounds, normal work of breathing.  No wheezes, rales, rhonchi Heart-  Regular rate and rhythm, no murmurs, rubs or gallops GI- soft, non-tender, non-distended, bowel sounds present, no hepatosplenomegaly Extremities- no clubbing, cyanosis, or edema; DP/PT/radial pulses 1+ bilaterally MS- no significant deformity or atrophy Skin- warm and dry, multiple bruises Psych- euthymic mood, full affect Neuro- strength and sensation are intact  PPMinterrogation- reviewed in detail today,  See PACEART report  EKG:  EKG is ordered today. The ekg ordered today shows sinus rhythm, rate 73, normal intervals  Recent Labs: 07/29/2014: Magnesium 1.8 10/03/2014: ALT 39*; BUN 8; Creatinine 0.53; Hemoglobin 8.5*; Platelets 55*; Potassium 3.8; Sodium 139   Wt Readings from Last 3 Encounters:  10/09/14 142 lb (64.411 kg)  09/22/14 143 lb 11.2 oz (65.182 kg)  08/11/14 144 lb 11.2 oz (65.635 kg)     Assessment and Plan:  1.  Tachy/Brady Normal PPM function See Pace Art report No changes today  2.  Paroxysmal atrial fibrillation Predominately maintaining SR per symptoms.  Amiodarone 100mg  daily - LFT's recently checked and slightly elevated, will follow.  TSH not done since 10/2013, will check today.  She has had a recent eye exam.  Continue xarelto - in setting of platelet count  of 55, will ask oncology to weigh in on continuation.  Confirmed with patient that she is not taking ASA.  This patients CHA2DS2-VASc Score and unadjusted Ischemic Stroke Rate (% per year) is equal to 3.2 % stroke rate/year from a score of 3 Above score calculated as 1 point each if present [CHF, HTN, DM, Vascular=MI/PAD/Aortic Plaque, Age if 65-74, or Female] Above score calculated as 2 points each if present [Age > 75, or Stroke/TIA/TE]  3.  Thrombocytopenia No s/s of bleeding aside from chronic nosebleeds Platelet count 55 at last labs with oncology Will ask oncology to weigh in as above on continued Xarelto use  Current medicines are reviewed at length with the patient today.   The patient does not have concerns regarding her medicines.  The following changes were made today:  none  Labs/ tests ordered today include:   Orders Placed This Encounter  Procedures  . TSH  . EKG 12-Lead     Disposition:   FU with Dr [name] 6 months   Signed, Chanetta Marshall, NP 10/09/2014 11:17 AM  [facility] [address] [place] [address] [phone] (office) [phone] (fax

## 2014-10-09 ENCOUNTER — Ambulatory Visit (INDEPENDENT_AMBULATORY_CARE_PROVIDER_SITE_OTHER): Payer: Medicare Other | Admitting: Nurse Practitioner

## 2014-10-09 ENCOUNTER — Other Ambulatory Visit: Payer: Self-pay | Admitting: Internal Medicine

## 2014-10-09 ENCOUNTER — Telehealth: Payer: Self-pay | Admitting: Nurse Practitioner

## 2014-10-09 ENCOUNTER — Encounter: Payer: Self-pay | Admitting: Nurse Practitioner

## 2014-10-09 VITALS — BP 122/62 | HR 73 | Ht 62.0 in | Wt 142.0 lb

## 2014-10-09 DIAGNOSIS — I495 Sick sinus syndrome: Secondary | ICD-10-CM

## 2014-10-09 DIAGNOSIS — I480 Paroxysmal atrial fibrillation: Secondary | ICD-10-CM | POA: Diagnosis not present

## 2014-10-09 DIAGNOSIS — Z79899 Other long term (current) drug therapy: Secondary | ICD-10-CM

## 2014-10-09 LAB — TSH: TSH: 1.75 u[IU]/mL (ref 0.35–4.50)

## 2014-10-09 NOTE — Telephone Encounter (Signed)
New message ° ° ° ° °Returning a nurses call to get lab results °

## 2014-10-09 NOTE — Patient Instructions (Signed)
Your physician recommends that you continue on your current medications as directed. Please refer to the Current Medication list given to you today.  Your physician wants you to follow-up in: 6 months with Dr. [name].  You will receive a reminder letter in the mail two months in advance. If you don't receive a letter, please call our office to schedule the follow-up appointment @ [phone].  Your physician recommends that you have lab work today: TSH

## 2014-10-11 LAB — MDC_IDC_ENUM_SESS_TYPE_INCLINIC
Battery Impedance: 500 Ohm
Battery Voltage: 3.3 V
Lead Channel Setting Pacing Amplitude: 2.5 V
Lead Channel Setting Sensing Sensitivity: 2 mV
MDC IDC MSMT LEADCHNL RV PACING THRESHOLD AMPLITUDE: 0.75 V
MDC IDC MSMT LEADCHNL RV PACING THRESHOLD PULSEWIDTH: 0.4 ms
MDC IDC MSMT LEADCHNL RV SENSING INTR AMPL: 12 mV
MDC IDC PG SERIAL: 1095
MDC IDC SET LEADCHNL RV PACING PULSEWIDTH: 0.4 ms

## 2014-10-12 ENCOUNTER — Ambulatory Visit: Payer: Medicare Other

## 2014-10-12 ENCOUNTER — Ambulatory Visit (HOSPITAL_BASED_OUTPATIENT_CLINIC_OR_DEPARTMENT_OTHER): Payer: Medicare Other | Admitting: Internal Medicine

## 2014-10-12 ENCOUNTER — Encounter: Payer: Self-pay | Admitting: Internal Medicine

## 2014-10-12 ENCOUNTER — Other Ambulatory Visit (HOSPITAL_BASED_OUTPATIENT_CLINIC_OR_DEPARTMENT_OTHER): Payer: Medicare Other

## 2014-10-12 ENCOUNTER — Telehealth: Payer: Self-pay | Admitting: Internal Medicine

## 2014-10-12 VITALS — BP 140/46 | HR 70 | Temp 98.4°F | Resp 18 | Ht 62.0 in | Wt 143.8 lb

## 2014-10-12 DIAGNOSIS — C3411 Malignant neoplasm of upper lobe, right bronchus or lung: Secondary | ICD-10-CM | POA: Diagnosis not present

## 2014-10-12 DIAGNOSIS — C3491 Malignant neoplasm of unspecified part of right bronchus or lung: Secondary | ICD-10-CM

## 2014-10-12 DIAGNOSIS — C773 Secondary and unspecified malignant neoplasm of axilla and upper limb lymph nodes: Secondary | ICD-10-CM

## 2014-10-12 LAB — CBC WITH DIFFERENTIAL/PLATELET
BASO%: 0.4 % (ref 0.0–2.0)
Basophils Absolute: 0 10*3/uL (ref 0.0–0.1)
EOS%: 0.9 % (ref 0.0–7.0)
Eosinophils Absolute: 0 10*3/uL (ref 0.0–0.5)
HCT: 28.4 % — ABNORMAL LOW (ref 34.8–46.6)
HEMOGLOBIN: 9.3 g/dL — AB (ref 11.6–15.9)
LYMPH#: 0.9 10*3/uL (ref 0.9–3.3)
LYMPH%: 40.8 % (ref 14.0–49.7)
MCH: 31.3 pg (ref 25.1–34.0)
MCHC: 32.7 g/dL (ref 31.5–36.0)
MCV: 95.6 fL (ref 79.5–101.0)
MONO#: 0.4 10*3/uL (ref 0.1–0.9)
MONO%: 17.5 % — ABNORMAL HIGH (ref 0.0–14.0)
NEUT#: 0.9 10*3/uL — ABNORMAL LOW (ref 1.5–6.5)
NEUT%: 40.4 % (ref 38.4–76.8)
Platelets: 173 10*3/uL (ref 145–400)
RBC: 2.97 10*6/uL — AB (ref 3.70–5.45)
RDW: 23.1 % — ABNORMAL HIGH (ref 11.2–14.5)
WBC: 2.2 10*3/uL — ABNORMAL LOW (ref 3.9–10.3)

## 2014-10-12 LAB — COMPREHENSIVE METABOLIC PANEL (CC13)
ALT: 25 U/L (ref 0–55)
ANION GAP: 10 meq/L (ref 3–11)
AST: 40 U/L — AB (ref 5–34)
Albumin: 3.6 g/dL (ref 3.5–5.0)
Alkaline Phosphatase: 126 U/L (ref 40–150)
BUN: 7.5 mg/dL (ref 7.0–26.0)
CO2: 25 mEq/L (ref 22–29)
Calcium: 9.3 mg/dL (ref 8.4–10.4)
Chloride: 108 mEq/L (ref 98–109)
Creatinine: 0.7 mg/dL (ref 0.6–1.1)
EGFR: 88 mL/min/{1.73_m2} — ABNORMAL LOW (ref >90)
Glucose: 95 mg/dl (ref 70–140)
Potassium: 3.8 mEq/L (ref 3.5–5.1)
Sodium: 143 mEq/L (ref 136–145)
Total Bilirubin: 1.09 mg/dL (ref 0.20–1.20)
Total Protein: 6.8 g/dL (ref 6.4–8.3)

## 2014-10-12 NOTE — Telephone Encounter (Signed)
Pt confirmed labs/ov per 03/10 POF, gave pt AVS..... KJ

## 2014-10-12 NOTE — Progress Notes (Signed)
[place] [place] [phone]   [phone]  OFFICE PROGRESS NOTE  [name], PA-C [address] [place] [address]  DIAGNOSIS: Metastatic non-small cell lung cancer, adenocarcinoma. This was initially diagnosed as stage IB (T2a, N0, M0) In March of 2014, status post right upper lobectomy with lymph node dissection but the patient has evidence for disease recurrence in the right axilla status post resection.  PRIOR THERAPY:  1) Status post right axillary lymph node biopsy on 05/01/2014 under the care of Dr. Roxan Hockey. 2) Systemic chemotherapy with carboplatin for AUC of 5 and Alimta 500 mg/M2 every 3 weeks. First cycle 05/31/2014. Status post 6 cycles. Last dose was given 09/22/2014.   CURRENT THERAPY: None.  INTERVAL HISTORY: Nancy Hodges 69 y.o. female returns to the clinic today for followup visit. The patient completed 6 cycles of systemic chemotherapy with carboplatin and Alimta and tolerated it fairly well. She denied having any significant chest pain, shortness of breath, cough or hemoptysis. She has no weight loss or night sweats. She denied having any significant nausea or vomiting, no fever or chills. She had repeat CT scan of the chest, abdomen and pelvis performed recently and she is here for evaluation and discussion of her scan results.  MEDICAL HISTORY: Past Medical History  Diagnosis Date  . Asthma   . Allergic rhinitis   . History of kidney stones   . H/O hiatal hernia   . GERD (gastroesophageal reflux disease)   . Pulmonary nodule   . Paroxysmal atrial fibrillation     a. anticoagulated with Xarelto  . Atrial flutter   . Tachycardia-bradycardia syndrome     a. 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  [name] Anxiety   . Diverticulosis   . Internal hemorrhoid   . Arthritis   . Lung cancer     a. Stage IB non-small cell carcinoma, s/p R VATS, wedge resection, RU lobectomy 10/2012.  . Diabetes mellitus without  complication     steroid induced from chemo    ALLERGIES:  is allergic to prednisone; septra; penicillins; and vioxx.  MEDICATIONS:  Current Outpatient Prescriptions  Medication Sig Dispense Refill  . acetaminophen (TYLENOL) 500 MG tablet Take 500 mg by mouth every 6 (six) hours as needed for moderate pain or fever.     [name] albuterol (PROVENTIL HFA;VENTOLIN HFA) 108 (90 BASE) MCG/ACT inhaler Inhale 1-2 puffs into the lungs every 6 (six) hours as needed for wheezing or shortness of breath.    [name] albuterol (PROVENTIL) (2.5 MG/3ML) 0.083% nebulizer solution Take 3 mLs (2.5 mg total) by nebulization every 2 (two) hours as needed for shortness of breath. 120 mL 0  . amiodarone (PACERONE) 100 MG tablet Take 1 tablet (100 mg total) by mouth daily. 30 tablet 11  . amiodarone (PACERONE) 200 MG tablet     . dexamethasone (DECADRON) 4 MG tablet 4 mg po bid the day before, day of and day after chemo 40 tablet 1  . diltiazem (CARDIZEM CD) 240 MG 24 hr capsule Take 1 capsule (240 mg total) by mouth daily. 30 capsule 10  . doxycycline (VIBRAMYCIN) 100 MG capsule     . fluticasone (FLONASE) 50 MCG/ACT nasal spray Place 2 sprays into the nose daily as needed (for congestion).    . folic acid (FOLVITE) 1 MG tablet Take 1 tablet (1 mg total) by mouth daily. 30 tablet 4  . guaiFENesin-dextromethorphan (ROBITUSSIN DM) 100-10 MG/5ML syrup Take 5 mLs by mouth  3 (three) times daily as needed for cough. 118 mL 0  . ipratropium-albuterol (DUONEB) 0.5-2.5 (3) MG/3ML SOLN Take 3 mLs by nebulization every 6 (six) hours as needed. 360 mL 1  . JANUVIA 50 MG tablet Take 50 mg by mouth daily.   3  . PATADAY 0.2 % SOLN     . PRESCRIPTION MEDICATION Chemo - [facility]    . prochlorperazine (COMPAZINE) 10 MG tablet Take 1 tablet (10 mg total) by mouth every 6 (six) hours as needed for nausea or vomiting. 30 tablet 0  . rivaroxaban (XARELTO) 20 MG TABS tablet Take 1 tablet (20 mg total) by mouth daily. (Patient taking differently: Take  20 mg by mouth daily with supper. ) 30 tablet 11  . traMADol (ULTRAM) 50 MG tablet Take 1-2 tablets (50-100 mg total) by mouth every 6 (six) hours as needed (pain). 30 tablet 0   No current facility-administered medications for this visit.    SURGICAL HISTORY:  Past Surgical History  Procedure Laterality Date  . Partial hysterectomy    . Video assisted thoracoscopy (vats)/wedge resection Right 10/27/2012    Procedure: VIDEO ASSISTED THORACOSCOPY (VATS),RGHT UPPER LOBE LUNG WEDGE RESECTION;  Surgeon: [name], MD;  Location: [facility];  Service: Thoracic;  Laterality: Right;  . Lobectomy Right 10/27/2012    Procedure: LOBECTOMY;  Surgeon: [name], MD;  Location: [facility];  Service: Thoracic;  Laterality: Right;   RIGHT UPPER LOBECTOMY & Node Dissection  . Colonoscopy with polypectomy  2015  . Lymph node biopsy Right 05/01/2014    Procedure: LYMPH NODE BIOPSY, Right Axillary;  Surgeon: [name], MD;  Location: [facility];  Service: Thoracic;  Laterality: Right;  . Permanent pacemaker insertion N/A 09/14/2012    Nanostim (SJM) leadless pacemaker (LEADLESS II STUDY PATIENT) implanted by Dr [name]    REVIEW OF SYSTEMS:  Constitutional: negative Eyes: negative Ears, nose, mouth, throat, and face: negative Respiratory: negative Cardiovascular: negative Gastrointestinal: negative Genitourinary:negative Integument/breast: negative Hematologic/lymphatic: negative Musculoskeletal:negative Neurological: negative Behavioral/Psych: negative Endocrine: negative Allergic/Immunologic: negative   PHYSICAL EXAMINATION: General appearance: alert, cooperative and no distress Head: Normocephalic, without obvious abnormality, atraumatic Neck: no adenopathy, no JVD, supple, symmetrical, trachea midline and thyroid not enlarged, symmetric, no tenderness/mass/nodules Lymph nodes: Cervical, supraclavicular, and axillary nodes normal. Resp: clear to auscultation bilaterally Back:  symmetric, no curvature. ROM normal. No CVA tenderness. Cardio: regular rate and rhythm, S1, S2 normal, no murmur, click, rub or gallop GI: soft, non-tender; bowel sounds normal; no masses,  no organomegaly Extremities: extremities normal, atraumatic, no cyanosis or edema Neurologic: Alert and oriented X 3, normal strength and tone. Normal symmetric reflexes. Normal coordination and gait  ECOG PERFORMANCE STATUS: 1 - Symptomatic but completely ambulatory  Blood pressure 140/46, pulse 70, temperature 98.4 F (36.9 C), temperature source Oral, resp. rate 18, height 5' 2" (1.575 m), weight 143 lb 12.8 oz (65.227 kg), SpO2 100 %.  LABORATORY DATA: Lab Results  Component Value Date   WBC 2.2* 10/12/2014   HGB 9.3* 10/12/2014   HCT 28.4* 10/12/2014   MCV 95.6 10/12/2014   PLT 173 10/12/2014      Chemistry      Component Value Date/Time   NA 143 10/12/2014 1011   NA 139 10/03/2014 1252   K 3.8 10/12/2014 1011   K 3.8 10/03/2014 1252   CL 107 10/03/2014 1252   CO2 25 10/12/2014 1011   CO2 28 10/03/2014 1252   BUN 7.5 10/12/2014 1011   BUN 8 10/03/2014  1252   CREATININE 0.7 10/12/2014 1011   CREATININE 0.53 10/03/2014 1252      Component Value Date/Time   CALCIUM 9.3 10/12/2014 1011   CALCIUM 9.2 10/03/2014 1252   ALKPHOS 126 10/12/2014 1011   ALKPHOS 110 10/03/2014 1252   AST 40* 10/12/2014 1011   AST 48* 10/03/2014 1252   ALT 25 10/12/2014 1011   ALT 39* 10/03/2014 1252   BILITOT 1.09 10/12/2014 1011   BILITOT 1.1 10/03/2014 1252       RADIOGRAPHIC STUDIES: Ct Chest W Contrast  10/04/2014   CLINICAL DATA:  Restaging metastatic non-small cell lung cancer. Subsequent treatment strategy. Lung enhancement metastatic lymph nodes. Right lobe resection. Finished chemotherapy 1 week prior.  EXAM: CT CHEST, ABDOMEN, AND PELVIS WITH CONTRAST  TECHNIQUE: Multidetector CT imaging of the chest, abdomen and pelvis was performed following the standard protocol during bolus  administration of intravenous contrast.  CONTRAST:  160m OMNIPAQUE IOHEXOL 300 MG/ML  SOLN  COMPARISON:  CT 07/29/2014, PET-CT 05/17/2014  FINDINGS: CT CHEST FINDINGS  Mediastinum/Nodes: Lymphadenectomy clips in the right axilla. This supraclavicular lymphadenopathy. No mediastinal or hilar lymphadenopathy. No pericardial. No central pulmonary embolism.  Lungs/Pleura: Postsurgical change in the right upper lobe mild linear thickening. No nodularity. Left lung is clear.  Musculoskeletal: No skeletal metastasis.  No chest wall abnormality  CT ABDOMEN AND PELVIS FINDINGS  Hepatobiliary: No focal hepatic lesion. Several gallstones lumen of the gallbladder. There are mildly enlarged periportal lymph nodes. 9 mm node on image 54, series 2 and 15 mm node on image 58, series 2 there were hypermetabolic nodes in the porta hepatis on comparison PET-CT scan. No new adenopathy.  Liver does have a lobular contour. This is mild enlargement of the spleen.  Kidneys:  Adrenal glands and kidneys are normal  Pancreas: Pancreatic parenchyma is normal.  No duct dilatation.  Spleen: Mild splenomegaly with calculus within volume 533 cc  Stomach/Bowel: Stomach, small bowel, appendix, cecum normal. The colon this several diverticula of the descending colon. No acute inflammation. Rectum is normal.  Vascular/Lymphatic: Abdominal aorta is normal caliber. There is no retroperitoneal or periportal lymphadenopathy. No pelvic lymphadenopathy.  Reproductive: Post hysterectomy.  No pelvic lymphadenopathy.  Other: No peritoneal disease or omental disease.  Musculoskeletal: No aggressive osseous lesion.  IMPRESSION: 1. Postsurgical change in the right upper lobe without evidence of local lung can't recurrence. 2. No evidence of metastatic adenopathy in the thorax. 3. Stable periportal lymphadenopathy. Spleen is mildly enlarged and there is mild lobular contour of the liver. Recommend correlation for early cirrhosis.   Electronically Signed   By:  SSuzy BouchardM.D.   On: 10/04/2014 12:58   Ct Abdomen Pelvis W Contrast  10/04/2014   CLINICAL DATA:  Restaging metastatic non-small cell lung cancer. Subsequent treatment strategy. Lung enhancement metastatic lymph nodes. Right lobe resection. Finished chemotherapy 1 week prior.  EXAM: CT CHEST, ABDOMEN, AND PELVIS WITH CONTRAST  TECHNIQUE: Multidetector CT imaging of the chest, abdomen and pelvis was performed following the standard protocol during bolus administration of intravenous contrast.  CONTRAST:  1034mOMNIPAQUE IOHEXOL 300 MG/ML  SOLN  COMPARISON:  CT 07/29/2014, PET-CT 05/17/2014  FINDINGS: CT CHEST FINDINGS  Mediastinum/Nodes: Lymphadenectomy clips in the right axilla. This supraclavicular lymphadenopathy. No mediastinal or hilar lymphadenopathy. No pericardial. No central pulmonary embolism.  Lungs/Pleura: Postsurgical change in the right upper lobe mild linear thickening. No nodularity. Left lung is clear.  Musculoskeletal: No skeletal metastasis.  No chest wall abnormality  CT ABDOMEN AND PELVIS FINDINGS  Hepatobiliary: No focal hepatic lesion. Several gallstones lumen of the gallbladder. There are mildly enlarged periportal lymph nodes. 9 mm node on image 54, series 2 and 15 mm node on image 58, series 2 there were hypermetabolic nodes in the porta hepatis on comparison PET-CT scan. No new adenopathy.  Liver does have a lobular contour. This is mild enlargement of the spleen.  Kidneys:  Adrenal glands and kidneys are normal  Pancreas: Pancreatic parenchyma is normal.  No duct dilatation.  Spleen: Mild splenomegaly with calculus within volume 533 cc  Stomach/Bowel: Stomach, small bowel, appendix, cecum normal. The colon this several diverticula of the descending colon. No acute inflammation. Rectum is normal.  Vascular/Lymphatic: Abdominal aorta is normal caliber. There is no retroperitoneal or periportal lymphadenopathy. No pelvic lymphadenopathy.  Reproductive: Post hysterectomy.  No pelvic  lymphadenopathy.  Other: No peritoneal disease or omental disease.  Musculoskeletal: No aggressive osseous lesion.  IMPRESSION: 1. Postsurgical change in the right upper lobe without evidence of local lung can't recurrence. 2. No evidence of metastatic adenopathy in the thorax. 3. Stable periportal lymphadenopathy. Spleen is mildly enlarged and there is mild lobular contour of the liver. Recommend correlation for early cirrhosis.   Electronically Signed   By: Suzy Bouchard M.D.   On: 10/04/2014 12:58   ASSESSMENT AND PLAN: This is a very pleasant 68 years old white female recently diagnosed with metastatic non-small cell lung cancer, adenocarcinoma with negative EGFR mutation and negative ALK gene translocation. This was initially diagnosed as stage IB status post right upper lobectomy but the patient had evidence for disease recurrence in the right axilla status post resection. She is also status post 6 cycles of systemic chemotherapy with carboplatin and Alimta. The patient tolerated her treatment fairly well with no significant adverse effects. The recent CT scan of the chest, abdomen and pelvis showed no evidence for disease recurrence. I discussed the scan results with the patient today. I recommended for her to continue on observation with repeat CT scan of the chest in 3 months for reevaluation of her disease. Chemotherapy-induced anemia and leukocytopenia, we will continue to monitor this closely for now and her count is expected to recover slowly over the next few weeks. She was advised to call immediately if she has any concerning symptoms in the interval. The patient voices understanding of current disease status and treatment options and is in agreement with the current care plan.  All questions were answered. The patient knows to call the clinic with any problems, questions or concerns. We can certainly see the patient much sooner if necessary.  Disclaimer: This note was dictated with  voice recognition software. Similar sounding words can inadvertently be transcribed and may not be corrected upon review.

## 2014-10-20 ENCOUNTER — Other Ambulatory Visit: Payer: Self-pay | Admitting: Medical Oncology

## 2014-10-20 ENCOUNTER — Telehealth: Payer: Self-pay | Admitting: *Deleted

## 2014-10-20 DIAGNOSIS — C3491 Malignant neoplasm of unspecified part of right bronchus or lung: Secondary | ICD-10-CM

## 2014-10-20 MED ORDER — FOLIC ACID 1 MG PO TABS: 1.0000 mg | ORAL_TABLET | Freq: Every day | ORAL | Status: DC

## 2014-10-20 NOTE — Telephone Encounter (Signed)
Patient called stating she needed a refill on folic acid.  I spoke with desk nurse to help with e prescribe.  She did.  I called the patient back and stated it was e prescribe and at the [facility] pharmacy.

## 2015-01-11 ENCOUNTER — Ambulatory Visit (HOSPITAL_COMMUNITY)
Admission: RE | Admit: 2015-01-11 | Discharge: 2015-01-11 | Disposition: A | Discharge status: Home / Self Care | Payer: Medicare Other | Source: Ambulatory Visit | Attending: Internal Medicine | Admitting: Internal Medicine

## 2015-01-11 ENCOUNTER — Other Ambulatory Visit (HOSPITAL_BASED_OUTPATIENT_CLINIC_OR_DEPARTMENT_OTHER): Payer: Medicare Other

## 2015-01-11 ENCOUNTER — Encounter (HOSPITAL_COMMUNITY): Payer: Self-pay

## 2015-01-11 DIAGNOSIS — I4891 Unspecified atrial fibrillation: Secondary | ICD-10-CM | POA: Insufficient documentation

## 2015-01-11 DIAGNOSIS — C3491 Malignant neoplasm of unspecified part of right bronchus or lung: Secondary | ICD-10-CM | POA: Diagnosis present

## 2015-01-11 DIAGNOSIS — Z9221 Personal history of antineoplastic chemotherapy: Secondary | ICD-10-CM | POA: Insufficient documentation

## 2015-01-11 DIAGNOSIS — C3411 Malignant neoplasm of upper lobe, right bronchus or lung: Secondary | ICD-10-CM

## 2015-01-11 DIAGNOSIS — R05 Cough: Secondary | ICD-10-CM | POA: Diagnosis not present

## 2015-01-11 DIAGNOSIS — Z87891 Personal history of nicotine dependence: Secondary | ICD-10-CM | POA: Diagnosis not present

## 2015-01-11 DIAGNOSIS — E119 Type 2 diabetes mellitus without complications: Secondary | ICD-10-CM | POA: Insufficient documentation

## 2015-01-11 DIAGNOSIS — R0602 Shortness of breath: Secondary | ICD-10-CM | POA: Insufficient documentation

## 2015-01-11 LAB — CBC WITH DIFFERENTIAL/PLATELET
BASO%: 0.6 % (ref 0.0–2.0)
BASOS ABS: 0 10*3/uL (ref 0.0–0.1)
EOS%: 3.5 % (ref 0.0–7.0)
Eosinophils Absolute: 0.1 10*3/uL (ref 0.0–0.5)
HEMATOCRIT: 36.7 % (ref 34.8–46.6)
HGB: 12 g/dL (ref 11.6–15.9)
LYMPH#: 1.1 10*3/uL (ref 0.9–3.3)
LYMPH%: 31.9 % (ref 14.0–49.7)
MCH: 29.6 pg (ref 25.1–34.0)
MCHC: 32.7 g/dL (ref 31.5–36.0)
MCV: 90.4 fL (ref 79.5–101.0)
MONO#: 0.3 10*3/uL (ref 0.1–0.9)
MONO%: 8.8 % (ref 0.0–14.0)
NEUT#: 1.9 10*3/uL (ref 1.5–6.5)
NEUT%: 55.2 % (ref 38.4–76.8)
Platelets: 156 10*3/uL (ref 145–400)
RBC: 4.06 10*6/uL (ref 3.70–5.45)
RDW: 15.3 % — AB (ref 11.2–14.5)
WBC: 3.4 10*3/uL — AB (ref 3.9–10.3)

## 2015-01-11 LAB — COMPREHENSIVE METABOLIC PANEL (CC13)
ALK PHOS: 146 U/L (ref 40–150)
ALT: 22 U/L (ref 0–55)
ANION GAP: 7 meq/L (ref 3–11)
AST: 38 U/L — ABNORMAL HIGH (ref 5–34)
Albumin: 3.8 g/dL (ref 3.5–5.0)
BUN: 13.3 mg/dL (ref 7.0–26.0)
CALCIUM: 9.5 mg/dL (ref 8.4–10.4)
CHLORIDE: 107 meq/L (ref 98–109)
CO2: 27 meq/L (ref 22–29)
CREATININE: 0.7 mg/dL (ref 0.6–1.1)
EGFR: 83 mL/min/{1.73_m2} — ABNORMAL LOW (ref >90)
Glucose: 125 mg/dl (ref 70–140)
Potassium: 4.4 mEq/L (ref 3.5–5.1)
SODIUM: 141 meq/L (ref 136–145)
Total Bilirubin: 1.44 mg/dL — ABNORMAL HIGH (ref 0.20–1.20)
Total Protein: 7.3 g/dL (ref 6.4–8.3)

## 2015-01-11 MED ORDER — IOHEXOL 300 MG/ML  SOLN
100.0000 mL | Freq: Once | INTRAMUSCULAR | Status: AC | PRN
  Administered 2015-01-11: 80 mL via INTRAVENOUS

## 2015-01-18 ENCOUNTER — Encounter: Payer: Self-pay | Admitting: Internal Medicine

## 2015-01-18 ENCOUNTER — Telehealth: Payer: Self-pay | Admitting: Internal Medicine

## 2015-01-18 ENCOUNTER — Ambulatory Visit (HOSPITAL_BASED_OUTPATIENT_CLINIC_OR_DEPARTMENT_OTHER): Payer: Medicare Other | Admitting: Internal Medicine

## 2015-01-18 ENCOUNTER — Other Ambulatory Visit: Payer: Medicare Other

## 2015-01-18 ENCOUNTER — Encounter: Payer: Self-pay | Admitting: *Deleted

## 2015-01-18 VITALS — BP 142/54 | HR 74 | Temp 98.6°F | Resp 17 | Ht 62.0 in | Wt 145.3 lb

## 2015-01-18 DIAGNOSIS — R599 Enlarged lymph nodes, unspecified: Secondary | ICD-10-CM | POA: Diagnosis not present

## 2015-01-18 DIAGNOSIS — Z85118 Personal history of other malignant neoplasm of bronchus and lung: Secondary | ICD-10-CM

## 2015-01-18 DIAGNOSIS — C3491 Malignant neoplasm of unspecified part of right bronchus or lung: Secondary | ICD-10-CM

## 2015-01-18 NOTE — Progress Notes (Signed)
01/18/2015 [place] study # [id number] Patient in clinic today to see  Dr. Julien Nordmann. Received referral from Dr. Julien Nordmann for the study.This RN met with the patient and her best friend to discuss the study.The patient was alert and oriented X3. This RN provided a copy of the consent and discussed the purpose of the study.This RN reviewed the risks and benefits with the patient. This RN explained that there would be no costs to the patient and that she would not be compensated for participating. She verbalized understanding of the 20 ml blood requirement from the vein. The patient expressed interest and requested to sign consent today, and requested to have blood drawn today.   This RN reviewed the consent in its entirety. She denied any questions about the study and stated she was happy to participate in research and was excited to know her physician was a proponent of research. Patient signed the consent [id number] version 2, dated 06/12/2014 at 1130. She was given a copy of the signed consent along with this RN's card with contact information and a copy of the clinical trials brochure. Patient assisted to lab per the research assistant for research labs.  Total time spent with patient = 18 minutes. Marcellus Scott, RN Clinical research

## 2015-01-18 NOTE — Telephone Encounter (Signed)
Gave and printed appt sched adn avs for pt for Aug ....gave barium

## 2015-01-18 NOTE — Progress Notes (Signed)
[facility] [phone]   Fax:([phone]) [phone]  OFFICE PROGRESS NOTE  [name], PA-C [address] [place] [address]  DIAGNOSIS: Metastatic non-small cell lung cancer, adenocarcinoma. This was initially diagnosed as stage IB (T2a, N0, M0) In March of 2014, status post right upper lobectomy with lymph node dissection but the patient has evidence for disease recurrence in the right axilla status post resection.  PRIOR THERAPY:  1) Status post right axillary lymph node biopsy on 05/01/2014 under the care of Dr. Roxan Hockey. 2) Systemic chemotherapy with carboplatin for AUC of 5 and Alimta 500 mg/M2 every 3 weeks. First cycle 05/31/2014. Status post 6 cycles. Last dose was given 09/22/2014.   CURRENT THERAPY: None.  INTERVAL HISTORY: Nancy Hodges 69 y.o. female returns to the clinic today for followup visit. The patient has been observation for the last 3 months. She is feeling fine today with no specific complaints. She denied having any significant chest pain, shortness of breath, cough or hemoptysis. She has no weight loss or night sweats. She denied having any significant nausea or vomiting, no fever or chills. She had repeat CT scan of the chest, abdomen and pelvis performed recently and she is here for evaluation and discussion of her scan results.  MEDICAL HISTORY: Past Medical History  Diagnosis Date  . Asthma   . Allergic rhinitis   . History of kidney stones   . H/O hiatal hernia   . GERD (gastroesophageal reflux disease)   . Pulmonary nodule   . Paroxysmal atrial fibrillation     a. anticoagulated with Xarelto  . Atrial flutter   . Tachycardia-bradycardia syndrome     a. 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  [name] Anxiety   . Diverticulosis   . Internal hemorrhoid   . Arthritis   . Lung cancer     a. Stage IB non-small cell carcinoma, s/p R VATS, wedge resection, RU lobectomy 10/2012.  . Diabetes mellitus without  complication     steroid induced from chemo    ALLERGIES:  is allergic to prednisone; septra; penicillins; and vioxx.  MEDICATIONS:  Current Outpatient Prescriptions  Medication Sig Dispense Refill  . acetaminophen (TYLENOL) 500 MG tablet Take 500 mg by mouth every 6 (six) hours as needed for moderate pain or fever.     [name] albuterol (PROVENTIL HFA;VENTOLIN HFA) 108 (90 BASE) MCG/ACT inhaler Inhale 1-2 puffs into the lungs every 6 (six) hours as needed for wheezing or shortness of breath.    [name] albuterol (PROVENTIL) (2.5 MG/3ML) 0.083% nebulizer solution Take 3 mLs (2.5 mg total) by nebulization every 2 (two) hours as needed for shortness of breath. 120 mL 0  . diltiazem (CARDIZEM CD) 240 MG 24 hr capsule Take 1 capsule (240 mg total) by mouth daily. 30 capsule 10  . fluticasone (FLONASE) 50 MCG/ACT nasal spray Place 2 sprays into the nose daily as needed (for congestion).    . rivaroxaban (XARELTO) 20 MG TABS tablet Take 1 tablet (20 mg total) by mouth daily. (Patient taking differently: Take 20 mg by mouth daily with supper. ) 30 tablet 11  . traMADol (ULTRAM) 50 MG tablet Take 1-2 tablets (50-100 mg total) by mouth every 6 (six) hours as needed (pain). 30 tablet 0  . JANUVIA 50 MG tablet Take 50 mg by mouth daily.   3   No current facility-administered medications for this visit.    SURGICAL HISTORY:  Past Surgical History  Procedure Laterality  Date  . Partial hysterectomy    . Video assisted thoracoscopy (vats)/wedge resection Right 10/27/2012    Procedure: VIDEO ASSISTED THORACOSCOPY (VATS),RGHT UPPER LOBE LUNG WEDGE RESECTION;  Surgeon: [name], MD;  Location: [facility];  Service: Thoracic;  Laterality: Right;  . Lobectomy Right 10/27/2012    Procedure: LOBECTOMY;  Surgeon: [name], MD;  Location: [facility];  Service: Thoracic;  Laterality: Right;   RIGHT UPPER LOBECTOMY & Node Dissection  . Colonoscopy with polypectomy  2015  . Lymph node biopsy Right 05/01/2014     Procedure: LYMPH NODE BIOPSY, Right Axillary;  Surgeon: [name], MD;  Location: [facility];  Service: Thoracic;  Laterality: Right;  . Permanent pacemaker insertion N/A 09/14/2012    Nanostim (SJM) leadless pacemaker (LEADLESS II STUDY PATIENT) implanted by Dr [name]    REVIEW OF SYSTEMS:  Constitutional: negative Eyes: negative Ears, nose, mouth, throat, and face: negative Respiratory: negative Cardiovascular: negative Gastrointestinal: negative Genitourinary:negative Integument/breast: negative Hematologic/lymphatic: negative Musculoskeletal:negative Neurological: negative Behavioral/Psych: negative Endocrine: negative Allergic/Immunologic: negative   PHYSICAL EXAMINATION: General appearance: alert, cooperative and no distress Head: Normocephalic, without obvious abnormality, atraumatic Neck: no adenopathy, no JVD, supple, symmetrical, trachea midline and thyroid not enlarged, symmetric, no tenderness/mass/nodules Lymph nodes: Cervical, supraclavicular, and axillary nodes normal. Resp: clear to auscultation bilaterally Back: symmetric, no curvature. ROM normal. No CVA tenderness. Cardio: regular rate and rhythm, S1, S2 normal, no murmur, click, rub or gallop GI: soft, non-tender; bowel sounds normal; no masses,  no organomegaly Extremities: extremities normal, atraumatic, no cyanosis or edema Neurologic: Alert and oriented X 3, normal strength and tone. Normal symmetric reflexes. Normal coordination and gait  ECOG PERFORMANCE STATUS: 1 - Symptomatic but completely ambulatory  Blood pressure 142/54, pulse 74, temperature 98.6 F (37 C), temperature source Oral, resp. rate 17, height 5' 2"  (1.575 m), weight 145 lb 4.8 oz (65.908 kg), SpO2 96 %.  LABORATORY DATA: Lab Results  Component Value Date   WBC 3.4* 01/11/2015   HGB 12.0 01/11/2015   HCT 36.7 01/11/2015   MCV 90.4 01/11/2015   PLT 156 01/11/2015      Chemistry      Component Value Date/Time   NA 141  01/11/2015 1008   NA 139 10/03/2014 1252   K 4.4 01/11/2015 1008   K 3.8 10/03/2014 1252   CL 107 10/03/2014 1252   CO2 27 01/11/2015 1008   CO2 28 10/03/2014 1252   BUN 13.3 01/11/2015 1008   BUN 8 10/03/2014 1252   CREATININE 0.7 01/11/2015 1008   CREATININE 0.53 10/03/2014 1252      Component Value Date/Time   CALCIUM 9.5 01/11/2015 1008   CALCIUM 9.2 10/03/2014 1252   ALKPHOS 146 01/11/2015 1008   ALKPHOS 110 10/03/2014 1252   AST 38* 01/11/2015 1008   AST 48* 10/03/2014 1252   ALT 22 01/11/2015 1008   ALT 39* 10/03/2014 1252   BILITOT 1.44* 01/11/2015 1008   BILITOT 1.1 10/03/2014 1252       RADIOGRAPHIC STUDIES: Ct Chest W Contrast  01/11/2015   CLINICAL DATA:  Stage IV non-small-cell carcinoma RIGHT lung, prior surgery and chemotherapy, experiencing cough and shortness of breath; past history asthma, former smoker, GERD, atrial fibrillation/ flutter, diabetes  EXAM: CT CHEST WITH CONTRAST  TECHNIQUE: Multidetector CT imaging of the chest was performed during intravenous contrast administration. Sagittal and coronal MPR images reconstructed from axial data set.  CONTRAST:  33m OMNIPAQUE IOHEXOL 300 MG/ML  SOLN IV.  COMPARISON:  10/04/2014  FINDINGS: Scattered atherosclerotic calcifications aorta, proximal great vessels, and coronary arteries.  Implantable defibrillator RIGHT ventricle.  9 mm short axis low-attenuation subcarinal node image 26 versus fluid.  No thoracic adenopathy.  Splenic enlargement, 14.9 cm greatest axial dimension.  Distended gallbladder with dependent calculi.  Enlarged periportal lymph node 16 mm short axis image 59 previously 15 mm.  Enlarged periportal lymph node 12 mm short axis image 55 previously 9 mm.  Enlarged portal caval lymph node 15 mm short axis image 59 unchanged.  Upper normal sized celiac node 9 mm short axis image 56 previously 7 mm.  Postsurgical changes of RIGHT axillary lymph node dissection and RIGHT upper lobectomy.  Narrowing of the  proximal RIGHT middle and RIGHT lower lobe bronchi.  Subsegmental atelectasis and scarring at peripheral RIGHT lung base.  Remaining lungs clear.  No infiltrate, pleural effusion, pneumothorax, or discrete mass/nodule.  No osseous metastases identified.  IMPRESSION: Postsurgical changes of RIGHT upper lobectomy and RIGHT axillary node dissection.  Narrowing of the origins of the RIGHT middle and RIGHT lower lobe bronchi.  Stable RIGHT basilar atelectasis versus scarring.  Periportal lymph nodes minimally increased since previous exam.  No new intrathoracic metastatic lesions identified.  Splenomegaly.   Electronically Signed   By: Lavonia Dana M.D.   On: 01/11/2015 12:24   ASSESSMENT AND PLAN: This is a very pleasant 69 years old white female recently diagnosed with metastatic non-small cell lung cancer, adenocarcinoma with negative EGFR mutation and negative ALK gene translocation. This was initially diagnosed as stage IB status post right upper lobectomy but the patient had evidence for disease recurrence in the right axilla status post resection. She is also status post 6 cycles of systemic chemotherapy with carboplatin and Alimta. The patient tolerated her treatment fairly well with no significant adverse effects. The recent CT scan of the chest, abdomen and pelvis showed no evidence for disease recurrence except for minimal enlargement of the periportal lymph nodes. Her serum bilirubin is also slightly elevated. I discussed the scan and lab results with the patient today. I recommended for her to continue on observation with repeat CT scan of the chest in 2 months for reevaluation of her disease. She was also advised to call me immediately if she develop any jaundice or had any other significant complaints. The patient voices understanding of current disease status and treatment options and is in agreement with the current care plan.  All questions were answered. The patient knows to call the clinic with  any problems, questions or concerns. We can certainly see the patient much sooner if necessary.  Disclaimer: This note was dictated with voice recognition software. Similar sounding words can inadvertently be transcribed and may not be corrected upon review.

## 2015-03-14 ENCOUNTER — Encounter (HOSPITAL_COMMUNITY): Payer: Self-pay

## 2015-03-14 ENCOUNTER — Other Ambulatory Visit (HOSPITAL_BASED_OUTPATIENT_CLINIC_OR_DEPARTMENT_OTHER): Payer: Medicare Other

## 2015-03-14 ENCOUNTER — Ambulatory Visit (HOSPITAL_COMMUNITY)
Admission: RE | Admit: 2015-03-14 | Discharge: 2015-03-14 | Disposition: A | Discharge status: Home / Self Care | Payer: Medicare Other | Source: Ambulatory Visit | Attending: Internal Medicine | Admitting: Internal Medicine

## 2015-03-14 DIAGNOSIS — J984 Other disorders of lung: Secondary | ICD-10-CM | POA: Diagnosis not present

## 2015-03-14 DIAGNOSIS — K8020 Calculus of gallbladder without cholecystitis without obstruction: Secondary | ICD-10-CM | POA: Diagnosis not present

## 2015-03-14 DIAGNOSIS — I2510 Atherosclerotic heart disease of native coronary artery without angina pectoris: Secondary | ICD-10-CM | POA: Insufficient documentation

## 2015-03-14 DIAGNOSIS — C3491 Malignant neoplasm of unspecified part of right bronchus or lung: Secondary | ICD-10-CM

## 2015-03-14 DIAGNOSIS — Z08 Encounter for follow-up examination after completed treatment for malignant neoplasm: Secondary | ICD-10-CM | POA: Insufficient documentation

## 2015-03-14 DIAGNOSIS — Z85118 Personal history of other malignant neoplasm of bronchus and lung: Secondary | ICD-10-CM

## 2015-03-14 DIAGNOSIS — Z9889 Other specified postprocedural states: Secondary | ICD-10-CM | POA: Insufficient documentation

## 2015-03-14 DIAGNOSIS — R161 Splenomegaly, not elsewhere classified: Secondary | ICD-10-CM | POA: Insufficient documentation

## 2015-03-14 DIAGNOSIS — I7090 Unspecified atherosclerosis: Secondary | ICD-10-CM | POA: Diagnosis not present

## 2015-03-14 DIAGNOSIS — K5730 Diverticulosis of large intestine without perforation or abscess without bleeding: Secondary | ICD-10-CM | POA: Diagnosis not present

## 2015-03-14 LAB — COMPREHENSIVE METABOLIC PANEL (CC13)
ALK PHOS: 121 U/L (ref 40–150)
ALT: 26 U/L (ref 0–55)
AST: 36 U/L — ABNORMAL HIGH (ref 5–34)
Albumin: 3.9 g/dL (ref 3.5–5.0)
Anion Gap: 6 mEq/L (ref 3–11)
BILIRUBIN TOTAL: 1.89 mg/dL — AB (ref 0.20–1.20)
BUN: 12 mg/dL (ref 7.0–26.0)
CALCIUM: 9.4 mg/dL (ref 8.4–10.4)
CO2: 29 mEq/L (ref 22–29)
CREATININE: 0.8 mg/dL (ref 0.6–1.1)
Chloride: 106 mEq/L (ref 98–109)
EGFR: 73 mL/min/{1.73_m2} — ABNORMAL LOW (ref >90)
GLUCOSE: 95 mg/dL (ref 70–140)
POTASSIUM: 4.4 meq/L (ref 3.5–5.1)
Sodium: 141 mEq/L (ref 136–145)
Total Protein: 7 g/dL (ref 6.4–8.3)

## 2015-03-14 LAB — CBC WITH DIFFERENTIAL/PLATELET
BASO%: 0.7 % (ref 0.0–2.0)
BASOS ABS: 0 10*3/uL (ref 0.0–0.1)
EOS%: 4.4 % (ref 0.0–7.0)
Eosinophils Absolute: 0.2 10*3/uL (ref 0.0–0.5)
HEMATOCRIT: 35.7 % (ref 34.8–46.6)
HEMOGLOBIN: 12.3 g/dL (ref 11.6–15.9)
LYMPH%: 25.6 % (ref 14.0–49.7)
MCH: 32.3 pg (ref 25.1–34.0)
MCHC: 34.5 g/dL (ref 31.5–36.0)
MCV: 93.7 fL (ref 79.5–101.0)
MONO#: 0.4 10*3/uL (ref 0.1–0.9)
MONO%: 8.6 % (ref 0.0–14.0)
NEUT#: 2.5 10*3/uL (ref 1.5–6.5)
NEUT%: 60.7 % (ref 38.4–76.8)
PLATELETS: 156 10*3/uL (ref 145–400)
RBC: 3.81 10*6/uL (ref 3.70–5.45)
RDW: 16.6 % — ABNORMAL HIGH (ref 11.2–14.5)
WBC: 4.1 10*3/uL (ref 3.9–10.3)
lymph#: 1 10*3/uL (ref 0.9–3.3)

## 2015-03-14 MED ORDER — IOHEXOL 300 MG/ML  SOLN
100.0000 mL | Freq: Once | INTRAMUSCULAR | Status: AC | PRN
  Administered 2015-03-14: 100 mL via INTRAVENOUS

## 2015-03-21 ENCOUNTER — Encounter: Payer: Self-pay | Admitting: Internal Medicine

## 2015-03-21 ENCOUNTER — Telehealth: Payer: Self-pay | Admitting: Internal Medicine

## 2015-03-21 ENCOUNTER — Ambulatory Visit (HOSPITAL_BASED_OUTPATIENT_CLINIC_OR_DEPARTMENT_OTHER): Payer: Medicare Other | Admitting: Internal Medicine

## 2015-03-21 VITALS — BP 123/45 | HR 68 | Temp 98.2°F | Resp 17 | Ht 62.0 in | Wt 144.5 lb

## 2015-03-21 DIAGNOSIS — K8020 Calculus of gallbladder without cholecystitis without obstruction: Secondary | ICD-10-CM | POA: Diagnosis not present

## 2015-03-21 DIAGNOSIS — Z85118 Personal history of other malignant neoplasm of bronchus and lung: Secondary | ICD-10-CM

## 2015-03-21 DIAGNOSIS — C3491 Malignant neoplasm of unspecified part of right bronchus or lung: Secondary | ICD-10-CM

## 2015-03-21 NOTE — Progress Notes (Signed)
[facility] [phone]   [phone]  OFFICE PROGRESS NOTE  [name], PA-C [address] [place] [address]  DIAGNOSIS: Metastatic non-small cell lung cancer, adenocarcinoma. This was initially diagnosed as stage IB (T2a, N0, M0) In March of 2014, status post right upper lobectomy with lymph node dissection but the patient has evidence for disease recurrence in the right axilla status post resection.  PRIOR THERAPY:  1) Status post right axillary lymph node biopsy on 05/01/2014 under the care of Dr. Roxan Hockey. 2) Systemic chemotherapy with carboplatin for AUC of 5 and Alimta 500 mg/M2 every 3 weeks. First cycle 05/31/2014. Status post 6 cycles. Last dose was given 09/22/2014.   CURRENT THERAPY: None.  INTERVAL HISTORY: Nancy Hodges 69 y.o. female returns to the clinic today for followup visit accompanied by friend. She is feeling fine today with no specific complaints except for occasional right upper quadrant abdominal pain. She denied having any significant chest pain, shortness of breath, cough or hemoptysis. She has no weight loss or night sweats. She denied having any significant nausea or vomiting, no fever or chills. She had repeat CT scan of the chest, abdomen and pelvis performed recently and she is here for evaluation and discussion of her scan results.  MEDICAL HISTORY: Past Medical History  Diagnosis Date  . Asthma   . Allergic rhinitis   . History of kidney stones   . H/O hiatal hernia   . GERD (gastroesophageal reflux disease)   . Pulmonary nodule   . Paroxysmal atrial fibrillation     a. anticoagulated with Xarelto  . Atrial flutter   . Tachycardia-bradycardia syndrome     a. 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  [name] Anxiety   . Diverticulosis   . Internal hemorrhoid   . Arthritis   . Lung cancer     a. Stage IB non-small cell carcinoma, s/p R VATS, wedge resection, RU lobectomy 10/2012.  .  Diabetes mellitus without complication     steroid induced from chemo    ALLERGIES:  is allergic to prednisone; septra; penicillins; and vioxx.  MEDICATIONS:  Current Outpatient Prescriptions  Medication Sig Dispense Refill  . acetaminophen (TYLENOL) 500 MG tablet Take 500 mg by mouth every 6 (six) hours as needed for moderate pain or fever.     [name] amiodarone (PACERONE) 100 MG tablet Take 100 mg by mouth daily.  11  . diltiazem (CARDIZEM CD) 240 MG 24 hr capsule Take 1 capsule (240 mg total) by mouth daily. 30 capsule 10  . fluticasone (FLONASE) 50 MCG/ACT nasal spray Place 2 sprays into the nose daily as needed (for congestion).    [name] JANUVIA 25 MG tablet Take 25 mg by mouth daily.  4  . montelukast (SINGULAIR) 10 MG tablet Take 10 mg by mouth daily.  5  . rivaroxaban (XARELTO) 20 MG TABS tablet Take 1 tablet (20 mg total) by mouth daily. (Patient taking differently: Take 20 mg by mouth daily with supper. ) 30 tablet 11  . traMADol (ULTRAM) 50 MG tablet Take 1-2 tablets (50-100 mg total) by mouth every 6 (six) hours as needed (pain). 30 tablet 0  . albuterol (PROVENTIL HFA;VENTOLIN HFA) 108 (90 BASE) MCG/ACT inhaler Inhale 1-2 puffs into the lungs every 6 (six) hours as needed for wheezing or shortness of breath.    [name] albuterol (PROVENTIL) (2.5 MG/3ML) 0.083% nebulizer solution Take 3 mLs (2.5 mg total) by nebulization every 2 (two) hours  as needed for shortness of breath. (Patient not taking: Reported on 03/21/2015) 120 mL 0   No current facility-administered medications for this visit.    SURGICAL HISTORY:  Past Surgical History  Procedure Laterality Date  . Partial hysterectomy    . Video assisted thoracoscopy (vats)/wedge resection Right 10/27/2012    Procedure: VIDEO ASSISTED THORACOSCOPY (VATS),RGHT UPPER LOBE LUNG WEDGE RESECTION;  Surgeon: [name], MD;  Location: [facility];  Service: Thoracic;  Laterality: Right;  . Lobectomy Right 10/27/2012    Procedure: LOBECTOMY;   Surgeon: [name], MD;  Location: [facility];  Service: Thoracic;  Laterality: Right;   RIGHT UPPER LOBECTOMY & Node Dissection  . Colonoscopy with polypectomy  2015  . Lymph node biopsy Right 05/01/2014    Procedure: LYMPH NODE BIOPSY, Right Axillary;  Surgeon: [name], MD;  Location: [facility];  Service: Thoracic;  Laterality: Right;  . Permanent pacemaker insertion N/A 09/14/2012    Nanostim (SJM) leadless pacemaker (LEADLESS II STUDY PATIENT) implanted by Dr [name]    REVIEW OF SYSTEMS:  A comprehensive review of systems was negative except for: Gastrointestinal: positive for abdominal pain   PHYSICAL EXAMINATION: General appearance: alert, cooperative and no distress Head: Normocephalic, without obvious abnormality, atraumatic Neck: no adenopathy, no JVD, supple, symmetrical, trachea midline and thyroid not enlarged, symmetric, no tenderness/mass/nodules Lymph nodes: Cervical, supraclavicular, and axillary nodes normal. Resp: clear to auscultation bilaterally Back: symmetric, no curvature. ROM normal. No CVA tenderness. Cardio: regular rate and rhythm, S1, S2 normal, no murmur, click, rub or gallop GI: soft, non-tender; bowel sounds normal; no masses,  no organomegaly Extremities: extremities normal, atraumatic, no cyanosis or edema Neurologic: Alert and oriented X 3, normal strength and tone. Normal symmetric reflexes. Normal coordination and gait  ECOG PERFORMANCE STATUS: 1 - Symptomatic but completely ambulatory  Blood pressure 123/45, pulse 68, temperature 98.2 F (36.8 C), temperature source Oral, resp. rate 17, height 5' 2" (1.575 m), weight 144 lb 8 oz (65.545 kg), SpO2 99 %.  LABORATORY DATA: Lab Results  Component Value Date   WBC 4.1 03/14/2015   HGB 12.3 03/14/2015   HCT 35.7 03/14/2015   MCV 93.7 03/14/2015   PLT 156 03/14/2015      Chemistry      Component Value Date/Time   NA 141 03/14/2015 0930   NA 139 10/03/2014 1252   K 4.4 03/14/2015 0930    K 3.8 10/03/2014 1252   CL 107 10/03/2014 1252   CO2 29 03/14/2015 0930   CO2 28 10/03/2014 1252   BUN 12.0 03/14/2015 0930   BUN 8 10/03/2014 1252   CREATININE 0.8 03/14/2015 0930   CREATININE 0.53 10/03/2014 1252      Component Value Date/Time   CALCIUM 9.4 03/14/2015 0930   CALCIUM 9.2 10/03/2014 1252   ALKPHOS 121 03/14/2015 0930   ALKPHOS 110 10/03/2014 1252   AST 36* 03/14/2015 0930   AST 48* 10/03/2014 1252   ALT 26 03/14/2015 0930   ALT 39* 10/03/2014 1252   BILITOT 1.89* 03/14/2015 0930   BILITOT 1.1 10/03/2014 1252       RADIOGRAPHIC STUDIES: Ct Chest W Contrast  03/14/2015   CLINICAL DATA:  69 year old female with history of non-small-cell carcinoma in the right upper lobe status post surgical resection and chemotherapy. Followup evaluation.  EXAM: CT CHEST, ABDOMEN, AND PELVIS WITH CONTRAST  TECHNIQUE: Multidetector CT imaging of the chest, abdomen and pelvis was performed following the standard protocol during bolus administration of intravenous contrast.  CONTRAST:  178m OMNIPAQUE IOHEXOL 300 MG/ML  SOLN  COMPARISON:  Multiple priors, most recently chest CT 01/11/2015 and CT the abdomen and pelvis 10/04/2014.  FINDINGS: CT CHEST FINDINGS  Mediastinum/Lymph Nodes: Heart size is mildly enlarged. There is no significant pericardial fluid, thickening or pericardial calcification. Electronic device in the apex of the right ventricle likely represents an implantable pacemaker. There is atherosclerosis of the thoracic aorta, the great vessels of the mediastinum and the coronary arteries, including calcified atherosclerotic plaque in the left anterior descending and left circumflex coronary arteries. No pathologically enlarged mediastinal or hilar lymph nodes. Small hiatal hernia. No axillary lymphadenopathy.  Lungs/Pleura: Status post right upper lobectomy. Compensatory hyperexpansion of the right middle and lower lobes. Areas of peripheral pleuroparenchymal scarring in the  residual portions of the right lung which appear very similar to prior examinations. No suspicious appearing pulmonary nodules or masses. No acute consolidative airspace disease. No pleural effusions. Narrowing of the proximal right middle lobe and right lower lobe bronchi is similar to prior examinations.  Musculoskeletal/Soft Tissues: There are no aggressive appearing lytic or blastic lesions noted in the visualized portions of the skeleton.  CT ABDOMEN AND PELVIS FINDINGS  Hepatobiliary: Several small calcified granulomas in the liver. No suspicious cystic or solid hepatic lesions are otherwise noted. No intra or extrahepatic biliary ductal dilatation. Calcified gallstones lying dependently in the gallbladder. No current findings to suggest an acute cholecystitis at this time.  Pancreas: No pancreatic mass. No pancreatic ductal dilatation. No pancreatic or peripancreatic fluid or inflammatory changes.  Spleen: The spleen is enlarged measuring 5.4 x 15.6 x 11.6 cm (estimated splenic volume of 489 mL).  Adrenals/Urinary Tract: The appearance of the kidneys and bilateral adrenal glands is normal. No hydroureteronephrosis. Urinary bladder is normal in appearance.  Stomach/Bowel: The appearance of the stomach is normal. No pathologic dilatation of small bowel or colon. A few scattered colonic diverticulae are noted, particularly in the sigmoid colon, without surrounding inflammatory changes to suggest an acute diverticulitis at this time. Normal appendix.  Vascular/Lymphatic: Atherosclerosis throughout the abdominal and pelvic vasculature, without evidence of aneurysm or dissection. No lymphadenopathy noted in the abdomen or pelvis on today's CT examination.  Reproductive: Status post hysterectomy.  Ovaries are atrophic.  Other: No significant volume of ascites.  No pneumoperitoneum.  Musculoskeletal: There are no aggressive appearing lytic or blastic lesions noted in the visualized portions of the skeleton.   IMPRESSION: 1. Status post right upper lobectomy. No findings to suggest local recurrence of disease or metastatic disease in the chest, abdomen or pelvis. 2. Narrowing at the origins of the right middle and right lower lobe bronchi, unchanged compared to prior examinations. 3. Persistent splenomegaly. 4. Atherosclerosis, including 2 vessel coronary artery disease. Assessment for potential risk factor modification, dietary therapy or pharmacologic therapy may be warranted, if clinically indicated. 5. Cholelithiasis without evidence of acute cholecystitis at this time. 6. Mild colonic diverticulosis without evidence of acute diverticulitis at this time. 7. Additional incidental findings, as above.   Electronically Signed   By: DVinnie LangtonM.D.   On: 03/14/2015 12:26   Ct Abdomen Pelvis W Contrast  03/14/2015   CLINICAL DATA:  69year old female with history of non-small-cell carcinoma in the right upper lobe status post surgical resection and chemotherapy. Followup evaluation.  EXAM: CT CHEST, ABDOMEN, AND PELVIS WITH CONTRAST  TECHNIQUE: Multidetector CT imaging of the chest, abdomen and pelvis was performed following the standard protocol during bolus administration of intravenous contrast.  CONTRAST:  1072m  OMNIPAQUE IOHEXOL 300 MG/ML  SOLN  COMPARISON:  Multiple priors, most recently chest CT 01/11/2015 and CT the abdomen and pelvis 10/04/2014.  FINDINGS: CT CHEST FINDINGS  Mediastinum/Lymph Nodes: Heart size is mildly enlarged. There is no significant pericardial fluid, thickening or pericardial calcification. Electronic device in the apex of the right ventricle likely represents an implantable pacemaker. There is atherosclerosis of the thoracic aorta, the great vessels of the mediastinum and the coronary arteries, including calcified atherosclerotic plaque in the left anterior descending and left circumflex coronary arteries. No pathologically enlarged mediastinal or hilar lymph nodes. Small hiatal  hernia. No axillary lymphadenopathy.  Lungs/Pleura: Status post right upper lobectomy. Compensatory hyperexpansion of the right middle and lower lobes. Areas of peripheral pleuroparenchymal scarring in the residual portions of the right lung which appear very similar to prior examinations. No suspicious appearing pulmonary nodules or masses. No acute consolidative airspace disease. No pleural effusions. Narrowing of the proximal right middle lobe and right lower lobe bronchi is similar to prior examinations.  Musculoskeletal/Soft Tissues: There are no aggressive appearing lytic or blastic lesions noted in the visualized portions of the skeleton.  CT ABDOMEN AND PELVIS FINDINGS  Hepatobiliary: Several small calcified granulomas in the liver. No suspicious cystic or solid hepatic lesions are otherwise noted. No intra or extrahepatic biliary ductal dilatation. Calcified gallstones lying dependently in the gallbladder. No current findings to suggest an acute cholecystitis at this time.  Pancreas: No pancreatic mass. No pancreatic ductal dilatation. No pancreatic or peripancreatic fluid or inflammatory changes.  Spleen: The spleen is enlarged measuring 5.4 x 15.6 x 11.6 cm (estimated splenic volume of 489 mL).  Adrenals/Urinary Tract: The appearance of the kidneys and bilateral adrenal glands is normal. No hydroureteronephrosis. Urinary bladder is normal in appearance.  Stomach/Bowel: The appearance of the stomach is normal. No pathologic dilatation of small bowel or colon. A few scattered colonic diverticulae are noted, particularly in the sigmoid colon, without surrounding inflammatory changes to suggest an acute diverticulitis at this time. Normal appendix.  Vascular/Lymphatic: Atherosclerosis throughout the abdominal and pelvic vasculature, without evidence of aneurysm or dissection. No lymphadenopathy noted in the abdomen or pelvis on today's CT examination.  Reproductive: Status post hysterectomy.  Ovaries are  atrophic.  Other: No significant volume of ascites.  No pneumoperitoneum.  Musculoskeletal: There are no aggressive appearing lytic or blastic lesions noted in the visualized portions of the skeleton.  IMPRESSION: 1. Status post right upper lobectomy. No findings to suggest local recurrence of disease or metastatic disease in the chest, abdomen or pelvis. 2. Narrowing at the origins of the right middle and right lower lobe bronchi, unchanged compared to prior examinations. 3. Persistent splenomegaly. 4. Atherosclerosis, including 2 vessel coronary artery disease. Assessment for potential risk factor modification, dietary therapy or pharmacologic therapy may be warranted, if clinically indicated. 5. Cholelithiasis without evidence of acute cholecystitis at this time. 6. Mild colonic diverticulosis without evidence of acute diverticulitis at this time. 7. Additional incidental findings, as above.   Electronically Signed   By: Vinnie Langton M.D.   On: 03/14/2015 12:26   ASSESSMENT AND PLAN: This is a very pleasant 69 years old white female recently diagnosed with metastatic non-small cell lung cancer, adenocarcinoma with negative EGFR mutation and negative ALK gene translocation. This was initially diagnosed as stage IB status post right upper lobectomy but the patient had evidence for disease recurrence in the right axilla status post resection. She is also status post 6 cycles of systemic chemotherapy with carboplatin and  Alimta. The patient tolerated her treatment fairly well with no significant adverse effects. The recent CT scan of the chest, abdomen and pelvis showed no evidence for local recurrence or metastatic disease. There was cholelithiasis without evidence of acute cholecystitis. I discussed the scan and lab results with the patient today. I recommended for her to continue on observation with repeat CT scan of the chest in 3 months for reevaluation of her disease.  I also advised the patient to  see gastroenterologist close to home for evaluation of her gallstones. I will also refer her to Dr. Jonelle Sidle to establish care with a primary care physician. She was also advised to call me immediately if she develop any jaundice or had any other significant complaints. The patient voices understanding of current disease status and treatment options and is in agreement with the current care plan.  All questions were answered. The patient knows to call the clinic with any problems, questions or concerns. We can certainly see the patient much sooner if necessary.  Disclaimer: This note was dictated with voice recognition software. Similar sounding words can inadvertently be transcribed and may not be corrected upon review.

## 2015-03-21 NOTE — Telephone Encounter (Signed)
Gave avs & calendar for November. Also gave instructions & contrast for ct scan.

## 2015-04-11 ENCOUNTER — Other Ambulatory Visit: Payer: Self-pay | Admitting: Internal Medicine

## 2015-04-11 ENCOUNTER — Encounter: Payer: Self-pay | Admitting: Internal Medicine

## 2015-04-11 ENCOUNTER — Ambulatory Visit (INDEPENDENT_AMBULATORY_CARE_PROVIDER_SITE_OTHER): Payer: Medicare Other | Admitting: Internal Medicine

## 2015-04-11 VITALS — BP 130/58 | HR 70 | Ht 62.0 in | Wt 147.6 lb

## 2015-04-11 DIAGNOSIS — I480 Paroxysmal atrial fibrillation: Secondary | ICD-10-CM | POA: Diagnosis not present

## 2015-04-11 DIAGNOSIS — C3491 Malignant neoplasm of unspecified part of right bronchus or lung: Secondary | ICD-10-CM | POA: Diagnosis not present

## 2015-04-11 DIAGNOSIS — I495 Sick sinus syndrome: Secondary | ICD-10-CM

## 2015-04-11 LAB — CUP PACEART INCLINIC DEVICE CHECK
Brady Statistic RV Percent Paced: 0 %
Date Time Interrogation Session: 20160907144045
Lead Channel Impedance Value: 480 Ohm
Lead Channel Pacing Threshold Amplitude: 0.5 V
Lead Channel Sensing Intrinsic Amplitude: 12 mV
MDC IDC MSMT BATTERY VOLTAGE: 3.27 V
MDC IDC MSMT LEADCHNL RV PACING THRESHOLD PULSEWIDTH: 0.4 ms
MDC IDC SET LEADCHNL RV PACING AMPLITUDE: 2.5 V
MDC IDC SET LEADCHNL RV PACING PULSEWIDTH: 0.4 ms
MDC IDC SET LEADCHNL RV SENSING SENSITIVITY: 2 mV
Pulse Gen Serial Number: 1095

## 2015-04-11 MED ORDER — DILTIAZEM HCL ER COATED BEADS 240 MG PO CP24: 240.0000 mg | ORAL_CAPSULE | Freq: Every day | ORAL | Status: DC

## 2015-04-11 MED ORDER — AMIODARONE HCL 100 MG PO TABS: 100.0000 mg | ORAL_TABLET | ORAL | Status: DC

## 2015-04-11 MED ORDER — RIVAROXABAN 20 MG PO TABS: 20.0000 mg | ORAL_TABLET | Freq: Every day | ORAL | Status: DC

## 2015-04-11 NOTE — Patient Instructions (Addendum)
Medication Instructions:  Your physician has recommended you make the following change in your medication:  1) Decrease Amiodarone to '100mg'$  every other day    Labwork: None ordered  Testing/Procedures: None ordered  Follow-Up: Your physician wants you to follow-up in: 6 months with Chanetta Marshall, NP and 12 months with Dr Vallery Ridge will receive a reminder letter in the mail two months in advance. If you don't receive a letter, please call our office to schedule the follow-up appointment.      Any Other Special Instructions Will Be Listed Below (If Applicable).

## 2015-04-11 NOTE — Progress Notes (Signed)
PPCP: [name], PA-C  Nancy Hodges is a 69 y.o. female who presents today for routine electrophysiology followup.  She is doing very well. No longer receiving chemotherapy.  SOB is stable.  Rare palpitations. Today, she denies symptoms of chest pain, lower extremity edema, dizziness, presyncope, or syncope. She has only very rare palpitations (every few months lasting about an hour). The patient is otherwise without complaint today.   Past Medical History  Diagnosis Date  . Asthma   . Allergic rhinitis   . History of kidney stones   . H/O hiatal hernia   . GERD (gastroesophageal reflux disease)   . Pulmonary nodule   . Paroxysmal atrial fibrillation     a. anticoagulated with Xarelto  . Atrial flutter   . Tachycardia-bradycardia syndrome     a. 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  [name] Anxiety   . Diverticulosis   . Internal hemorrhoid   . Arthritis   . Lung cancer     a. Stage IB non-small cell carcinoma, s/p R VATS, wedge resection, RU lobectomy 10/2012.  . Diabetes mellitus without complication     steroid induced from chemo   Past Surgical History  Procedure Laterality Date  . Partial hysterectomy    . Video assisted thoracoscopy (vats)/wedge resection Right 10/27/2012    Procedure: VIDEO ASSISTED THORACOSCOPY (VATS),RGHT UPPER LOBE LUNG WEDGE RESECTION;  Surgeon: [name], MD;  Location: [facility];  Service: Thoracic;  Laterality: Right;  . Lobectomy Right 10/27/2012    Procedure: LOBECTOMY;  Surgeon: [name], MD;  Location: [facility];  Service: Thoracic;  Laterality: Right;   RIGHT UPPER LOBECTOMY & Node Dissection  . Colonoscopy with polypectomy  2015  . Lymph node biopsy Right 05/01/2014    Procedure: LYMPH NODE BIOPSY, Right Axillary;  Surgeon: [name], MD;  Location: [facility];  Service: Thoracic;  Laterality: Right;  . Permanent pacemaker insertion N/A 09/14/2012    Nanostim (SJM) leadless pacemaker (LEADLESS II STUDY PATIENT)  implanted by Dr [name]    Current Outpatient Prescriptions  Medication Sig Dispense Refill  . acetaminophen (TYLENOL) 500 MG tablet Take 500 mg by mouth every 6 (six) hours as needed for moderate pain or fever.     [name] albuterol (PROVENTIL HFA;VENTOLIN HFA) 108 (90 BASE) MCG/ACT inhaler Inhale 1-2 puffs into the lungs every 6 (six) hours as needed for wheezing or shortness of breath.    [name] albuterol (PROVENTIL) (2.5 MG/3ML) 0.083% nebulizer solution Take 3 mLs (2.5 mg total) by nebulization every 2 (two) hours as needed for shortness of breath. 120 mL 0  . amiodarone (PACERONE) 100 MG tablet Take 100 mg by mouth daily.  11  . diltiazem (CARDIZEM CD) 240 MG 24 hr capsule Take 1 capsule (240 mg total) by mouth daily. 30 capsule 10  . fluticasone (FLONASE) 50 MCG/ACT nasal spray Place 2 sprays into the nose daily as needed (for congestion).    [name] JANUVIA 25 MG tablet Take 25 mg by mouth daily.  4  . montelukast (SINGULAIR) 10 MG tablet Take 10 mg by mouth at bedtime.   5  . rivaroxaban (XARELTO) 20 MG TABS tablet Take 1 tablet (20 mg total) by mouth daily. (Patient taking differently: Take 20 mg by mouth daily with supper. ) 30 tablet 11  . traMADol (ULTRAM) 50 MG tablet Take 1-2 tablets (50-100 mg total) by mouth every 6 (six) hours as needed (pain). 30 tablet 0   No current facility-administered medications for this  visit.    Physical Exam: Filed Vitals:   04/11/15 1051  BP: 130/58  Pulse: 70  Height: '5\' 2"'$  (1.575 m)  Weight: 66.951 kg (147 lb 9.6 oz)    GEN- The patient is well appearing, alert and oriented x 3 today.   Head- normocephalic, atraumatic Eyes-  Sclera clear, conjunctiva pink Ears- hearing intact Oropharynx- clear Lungs- Clear to ausculation bilaterally, normal work of breathing Heart- Regular rate and rhythm, no murmurs, rubs or gallops, PMI not laterally displaced GI- soft, NT, ND, + BS Extremities- no clubbing, cyanosis, or edema  Pacemaker interrogation- reviewed in  detail today,  See PACEART report ekg today reveals sinus rhythm  Assessment and Plan:  1. Tachycardia/ Bradycardia Normal pacemaker function See Pace Art report No changes today  2. afib continue xarelto '20mg'$  daily Decrease amiodarone to '100mg'$  QOD  Return in 6 months to see EP NP I will see in a year

## 2015-05-02 ENCOUNTER — Telehealth: Payer: Self-pay | Admitting: Internal Medicine

## 2015-05-02 DIAGNOSIS — C3491 Malignant neoplasm of unspecified part of right bronchus or lung: Secondary | ICD-10-CM

## 2015-05-02 MED ORDER — AMIODARONE HCL 100 MG PO TABS
100.0000 mg | ORAL_TABLET | Freq: Every day | ORAL | Status: DC
Start: 2015-05-02 — End: 2016-04-09

## 2015-05-02 NOTE — Telephone Encounter (Addendum)
Spoke with patient and her heart fluttered back and forth.  Today has been worse, she noticed it 3 days after decreasing the dose of her Amiodarone.  Lasting about 46mn to an hour 2-3 times a day.  She is questioning if she should go back to '100mg'$  daily of the Amiodarone to see if this helps.  I let her know I would discuss with Dr ARayann Hemanand call her back.  I discussed with him and he says okay to increase to daily and call back if this does not help  She is going to do this and says she has also been under a lot of stress which could also be the cause of her symptoms

## 2015-05-02 NOTE — Telephone Encounter (Signed)
New Message  Pt c/o of "fluttering"- pt thinks it is due to amioderone dosage change Patient c/o Palpitations:  High priority if patient c/o lightheadedness and shortness of breath.  1. How long have you been having palpitations? About 3 days  2. Are you currently experiencing lightheadedness and shortness of breath? Little lightheaded  3. Have you checked your BP and heart rate? (document readings) BP 9/28 am-- 135/52 p 72  4. Are you experiencing any other symptoms? weakness

## 2015-06-08 ENCOUNTER — Telehealth: Payer: Self-pay | Admitting: *Deleted

## 2015-06-08 NOTE — Telephone Encounter (Signed)
Patient made aware of recent battery malfunction with Leadless pacemkaer. Patient was informed to call Dr. Jackalyn Lombard office with any lightheadness, dizziness, or fainting. I explained to patient the malfunction of battery is a dry battery cell. Patient verbalized understanding .

## 2015-06-13 ENCOUNTER — Ambulatory Visit (HOSPITAL_COMMUNITY)
Admission: RE | Admit: 2015-06-13 | Discharge: 2015-06-13 | Disposition: A | Discharge status: Home / Self Care | Payer: Medicare Other | Source: Ambulatory Visit | Attending: Internal Medicine | Admitting: Internal Medicine

## 2015-06-13 ENCOUNTER — Other Ambulatory Visit (HOSPITAL_BASED_OUTPATIENT_CLINIC_OR_DEPARTMENT_OTHER): Payer: Medicare Other

## 2015-06-13 ENCOUNTER — Other Ambulatory Visit: Payer: Self-pay | Admitting: *Deleted

## 2015-06-13 ENCOUNTER — Encounter (HOSPITAL_COMMUNITY): Payer: Self-pay

## 2015-06-13 DIAGNOSIS — C3491 Malignant neoplasm of unspecified part of right bronchus or lung: Secondary | ICD-10-CM | POA: Insufficient documentation

## 2015-06-13 DIAGNOSIS — Z9889 Other specified postprocedural states: Secondary | ICD-10-CM | POA: Diagnosis not present

## 2015-06-13 DIAGNOSIS — I2510 Atherosclerotic heart disease of native coronary artery without angina pectoris: Secondary | ICD-10-CM | POA: Insufficient documentation

## 2015-06-13 DIAGNOSIS — K8020 Calculus of gallbladder without cholecystitis without obstruction: Secondary | ICD-10-CM | POA: Insufficient documentation

## 2015-06-13 DIAGNOSIS — C3490 Malignant neoplasm of unspecified part of unspecified bronchus or lung: Secondary | ICD-10-CM | POA: Diagnosis not present

## 2015-06-13 DIAGNOSIS — Z08 Encounter for follow-up examination after completed treatment for malignant neoplasm: Secondary | ICD-10-CM | POA: Diagnosis not present

## 2015-06-13 LAB — CBC WITH DIFFERENTIAL/PLATELET
BASO%: 1.4 % (ref 0.0–2.0)
BASOS ABS: 0.1 10*3/uL (ref 0.0–0.1)
EOS ABS: 0.2 10*3/uL (ref 0.0–0.5)
EOS%: 3.3 % (ref 0.0–7.0)
HCT: 36.1 % (ref 34.8–46.6)
HEMOGLOBIN: 12.6 g/dL (ref 11.6–15.9)
LYMPH%: 27.1 % (ref 14.0–49.7)
MCH: 34.2 pg — AB (ref 25.1–34.0)
MCHC: 34.8 g/dL (ref 31.5–36.0)
MCV: 98.2 fL (ref 79.5–101.0)
MONO#: 0.4 10*3/uL (ref 0.1–0.9)
MONO%: 8 % (ref 0.0–14.0)
NEUT%: 60.2 % (ref 38.4–76.8)
NEUTROS ABS: 2.7 10*3/uL (ref 1.5–6.5)
PLATELETS: 198 10*3/uL (ref 145–400)
RBC: 3.68 10*6/uL — ABNORMAL LOW (ref 3.70–5.45)
RDW: 14.5 % (ref 11.2–14.5)
WBC: 4.5 10*3/uL (ref 3.9–10.3)
lymph#: 1.2 10*3/uL (ref 0.9–3.3)

## 2015-06-13 LAB — COMPREHENSIVE METABOLIC PANEL (CC13)
ALT: 24 U/L (ref 0–55)
AST: 35 U/L — AB (ref 5–34)
Albumin: 3.7 g/dL (ref 3.5–5.0)
Alkaline Phosphatase: 121 U/L (ref 40–150)
Anion Gap: 8 mEq/L (ref 3–11)
BUN: 12.8 mg/dL (ref 7.0–26.0)
CALCIUM: 10 mg/dL (ref 8.4–10.4)
CHLORIDE: 105 meq/L (ref 98–109)
CO2: 28 meq/L (ref 22–29)
CREATININE: 0.7 mg/dL (ref 0.6–1.1)
EGFR: 81 mL/min/{1.73_m2} — ABNORMAL LOW (ref >90)
Glucose: 115 mg/dl (ref 70–140)
POTASSIUM: 4.6 meq/L (ref 3.5–5.1)
SODIUM: 141 meq/L (ref 136–145)
Total Bilirubin: 1.75 mg/dL — ABNORMAL HIGH (ref 0.20–1.20)
Total Protein: 6.9 g/dL (ref 6.4–8.3)

## 2015-06-13 MED ORDER — IOHEXOL 300 MG/ML  SOLN
50.0000 mL | Freq: Once | INTRAMUSCULAR | Status: AC | PRN
  Administered 2015-06-13: 50 mL via ORAL

## 2015-06-13 MED ORDER — IOHEXOL 300 MG/ML  SOLN
100.0000 mL | Freq: Once | INTRAMUSCULAR | Status: AC | PRN
  Administered 2015-06-13: 100 mL via INTRAVENOUS

## 2015-06-20 ENCOUNTER — Telehealth: Payer: Self-pay | Admitting: Internal Medicine

## 2015-06-20 ENCOUNTER — Ambulatory Visit (HOSPITAL_BASED_OUTPATIENT_CLINIC_OR_DEPARTMENT_OTHER): Payer: Medicare Other | Admitting: Internal Medicine

## 2015-06-20 ENCOUNTER — Encounter: Payer: Self-pay | Admitting: Internal Medicine

## 2015-06-20 VITALS — BP 136/54 | HR 79 | Temp 98.4°F | Resp 18 | Ht 62.0 in | Wt 150.6 lb

## 2015-06-20 DIAGNOSIS — C3491 Malignant neoplasm of unspecified part of right bronchus or lung: Secondary | ICD-10-CM

## 2015-06-20 DIAGNOSIS — C3411 Malignant neoplasm of upper lobe, right bronchus or lung: Secondary | ICD-10-CM

## 2015-06-20 DIAGNOSIS — C773 Secondary and unspecified malignant neoplasm of axilla and upper limb lymph nodes: Secondary | ICD-10-CM | POA: Diagnosis present

## 2015-06-20 NOTE — Telephone Encounter (Signed)
Gave and pritnd appt sched and avs for pt for Feb 2017

## 2015-06-20 NOTE — Progress Notes (Signed)
[place] [phone]   Fax:([phone]) [phone]  OFFICE PROGRESS NOTE  Nancy Fairy, PA-C [address] [place] [address]  DIAGNOSIS: Metastatic non-small cell lung cancer, adenocarcinoma. This was initially diagnosed as stage IB (T2a, N0, M0) In March of 2014, status post right upper lobectomy with lymph node dissection but the patient has evidence for disease recurrence in the right axilla status post resection.  PRIOR THERAPY:  1) Status post right axillary lymph node biopsy on 05/01/2014 under the care of Dr. Roxan Hockey. 2) Systemic chemotherapy with carboplatin for AUC of 5 and Alimta 500 mg/M2 every 3 weeks. First cycle 05/31/2014. Status post 6 cycles. Last dose was given 09/22/2014.   CURRENT THERAPY: None.  INTERVAL HISTORY: Nancy Hodges 69 y.o. female returns to the clinic today for followup visit accompanied by friend. She is feeling fine today with no specific complaints except for recurrent sinus infection recently. She was seen at the emergency department at [facility] and treated with a course of antibiotics. She is feeling a little bit better today. She denied having any significant chest pain, shortness of breath, cough or hemoptysis. She has no weight loss or night sweats. She denied having any significant nausea or vomiting, no fever or chills. She had repeat CT scan of the chest, abdomen and pelvis performed recently and she is here for evaluation and discussion of her scan results.  MEDICAL HISTORY: Past Medical History  Diagnosis Date  . Asthma   . Allergic rhinitis   . History of kidney stones   . H/O hiatal hernia   . GERD (gastroesophageal reflux disease)   . Pulmonary nodule   . Paroxysmal atrial fibrillation (HCC)     a. anticoagulated with Xarelto  . Atrial flutter ([place])   . Tachycardia-bradycardia syndrome ([place])     a. 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  [name] Anxiety   . Diverticulosis   .  Internal hemorrhoid   . Arthritis   . Lung cancer ([place])     a. Stage IB non-small cell carcinoma, s/p R VATS, wedge resection, RU lobectomy 10/2012.  . Diabetes mellitus without complication (HCC)     steroid induced from chemo    ALLERGIES:  is allergic to prednisone; septra; penicillins; and vioxx.  MEDICATIONS:  Current Outpatient Prescriptions  Medication Sig Dispense Refill  . acetaminophen (TYLENOL) 500 MG tablet Take 500 mg by mouth every 6 (six) hours as needed for moderate pain or fever.     [name] albuterol (PROVENTIL HFA;VENTOLIN HFA) 108 (90 BASE) MCG/ACT inhaler Inhale 1-2 puffs into the lungs every 6 (six) hours as needed for wheezing or shortness of breath.    [name] albuterol (PROVENTIL) (2.5 MG/3ML) 0.083% nebulizer solution Take 3 mLs (2.5 mg total) by nebulization every 2 (two) hours as needed for shortness of breath. 120 mL 0  . diltiazem (CARDIZEM CD) 240 MG 24 hr capsule Take 1 capsule (240 mg total) by mouth daily. 30 capsule 11  . fluticasone (FLONASE) 50 MCG/ACT nasal spray Place 2 sprays into the nose daily as needed (for congestion).    . montelukast (SINGULAIR) 10 MG tablet Take 10 mg by mouth at bedtime.   5  . rivaroxaban (XARELTO) 20 MG TABS tablet Take 1 tablet (20 mg total) by mouth daily with supper. 30 tablet 11  . traMADol (ULTRAM) 50 MG tablet Take 1-2 tablets (50-100 mg total) by mouth every 6 (six) hours as needed (pain). 30 tablet 0  .  amiodarone (PACERONE) 100 MG tablet Take 1 tablet (100 mg total) by mouth daily. 90 tablet 3  . JANUVIA 25 MG tablet Take 25 mg by mouth daily.  4   No current facility-administered medications for this visit.    SURGICAL HISTORY:  Past Surgical History  Procedure Laterality Date  . Partial hysterectomy    . Video assisted thoracoscopy (vats)/wedge resection Right 10/27/2012    Procedure: VIDEO ASSISTED THORACOSCOPY (VATS),RGHT UPPER LOBE LUNG WEDGE RESECTION;  Surgeon: [name], MD;  Location: [facility];  Service:  Thoracic;  Laterality: Right;  . Lobectomy Right 10/27/2012    Procedure: LOBECTOMY;  Surgeon: [name], MD;  Location: [facility];  Service: Thoracic;  Laterality: Right;   RIGHT UPPER LOBECTOMY & Node Dissection  . Colonoscopy with polypectomy  2015  . Lymph node biopsy Right 05/01/2014    Procedure: LYMPH NODE BIOPSY, Right Axillary;  Surgeon: [name], MD;  Location: [facility];  Service: Thoracic;  Laterality: Right;  . Permanent pacemaker insertion N/A 09/14/2012    Nanostim (SJM) leadless pacemaker (LEADLESS II STUDY PATIENT) implanted by Dr [name]    REVIEW OF SYSTEMS:  A comprehensive review of systems was negative.   PHYSICAL EXAMINATION: General appearance: alert, cooperative and no distress Head: Normocephalic, without obvious abnormality, atraumatic Neck: no adenopathy, no JVD, supple, symmetrical, trachea midline and thyroid not enlarged, symmetric, no tenderness/mass/nodules Lymph nodes: Cervical, supraclavicular, and axillary nodes normal. Resp: clear to auscultation bilaterally Back: symmetric, no curvature. ROM normal. No CVA tenderness. Cardio: regular rate and rhythm, S1, S2 normal, no murmur, click, rub or gallop GI: soft, non-tender; bowel sounds normal; no masses,  no organomegaly Extremities: extremities normal, atraumatic, no cyanosis or edema Neurologic: Alert and oriented X 3, normal strength and tone. Normal symmetric reflexes. Normal coordination and gait  ECOG PERFORMANCE STATUS: 1 - Symptomatic but completely ambulatory  There were no vitals taken for this visit.  LABORATORY DATA: Lab Results  Component Value Date   WBC 4.5 06/13/2015   HGB 12.6 06/13/2015   HCT 36.1 06/13/2015   MCV 98.2 06/13/2015   PLT 198 06/13/2015      Chemistry      Component Value Date/Time   NA 141 06/13/2015 0929   NA 139 10/03/2014 1252   K 4.6 06/13/2015 0929   K 3.8 10/03/2014 1252   CL 107 10/03/2014 1252   CO2 28 06/13/2015 0929   CO2 28 10/03/2014  1252   BUN 12.8 06/13/2015 0929   BUN 8 10/03/2014 1252   CREATININE 0.7 06/13/2015 0929   CREATININE 0.53 10/03/2014 1252      Component Value Date/Time   CALCIUM 10.0 06/13/2015 0929   CALCIUM 9.2 10/03/2014 1252   ALKPHOS 121 06/13/2015 0929   ALKPHOS 110 10/03/2014 1252   AST 35* 06/13/2015 0929   AST 48* 10/03/2014 1252   ALT 24 06/13/2015 0929   ALT 39* 10/03/2014 1252   BILITOT 1.75* 06/13/2015 0929   BILITOT 1.1 10/03/2014 1252       RADIOGRAPHIC STUDIES: Ct Chest W Contrast  06/13/2015  CLINICAL DATA:  Right lung cancer restaging, chemotherapy complete. EXAM: CT CHEST, ABDOMEN, AND PELVIS WITH CONTRAST TECHNIQUE: Multidetector CT imaging of the chest, abdomen and pelvis was performed following the standard protocol during bolus administration of intravenous contrast. CONTRAST:  114m OMNIPAQUE IOHEXOL 300 MG/ML SOLN, 533mOMNIPAQUE IOHEXOL 300 MG/ML SOLN COMPARISON:  03/14/2015. FINDINGS: CT CHEST FINDINGS Mediastinum/Nodes: No pathologically enlarged mediastinal, hilar or axillary lymph nodes. Coronary  artery calcification. Heart size normal. No pericardial effusion. Tiny hiatal hernia. Lungs/Pleura: Biapical pleural parenchymal scarring. Postoperative changes of right upper lobectomy. Mild centrilobular emphysema. No pleural fluid. Narrowing at the distal bronchus intermedius and right middle lobe bronchus, as before. Airway is otherwise unremarkable. Musculoskeletal: No worrisome lytic or sclerotic lesions. CT ABDOMEN PELVIS FINDINGS Hepatobiliary: Liver is unremarkable. Small stones are seen in the gallbladder. No biliary ductal dilatation. Pancreas: Negative. Spleen: 4 mm low-attenuation lesion in the lower aspect of the spleen is unchanged and likely a cyst or hemangioma. Adrenals/Urinary Tract: Adrenal glands are unremarkable. Renal cortical scarring bilaterally. Kidneys are otherwise unremarkable. Ureters are decompressed. Bladder is grossly unremarkable. Stomach/Bowel: Tiny  hiatal hernia. Stomach, small bowel, appendix and colon are unremarkable. Vascular/Lymphatic: Atherosclerotic calcification of the arterial vasculature without abdominal aortic aneurysm. Abdominal peritoneal ligament and porta hepatis lymph nodes measure up to 1.5 cm in the portacaval station (series 2, image 60), stable. No additional pathologically enlarged lymph nodes. Reproductive: Hysterectomy.  Ovaries are visualized. Other: No free fluid. Mesenteries and peritoneum are unremarkable. No free fluid. Musculoskeletal: No worrisome lytic or sclerotic lesions. IMPRESSION: 1. Right upper lobectomy without evidence of recurrent or metastatic disease. 2. Coronary artery calcification. 3. Cholelithiasis. Electronically Signed   By: Lorin Picket M.D.   On: 06/13/2015 13:12   Ct Abdomen Pelvis W Contrast  06/13/2015  CLINICAL DATA:  Right lung cancer restaging, chemotherapy complete. EXAM: CT CHEST, ABDOMEN, AND PELVIS WITH CONTRAST TECHNIQUE: Multidetector CT imaging of the chest, abdomen and pelvis was performed following the standard protocol during bolus administration of intravenous contrast. CONTRAST:  174m OMNIPAQUE IOHEXOL 300 MG/ML SOLN, 534mOMNIPAQUE IOHEXOL 300 MG/ML SOLN COMPARISON:  03/14/2015. FINDINGS: CT CHEST FINDINGS Mediastinum/Nodes: No pathologically enlarged mediastinal, hilar or axillary lymph nodes. Coronary artery calcification. Heart size normal. No pericardial effusion. Tiny hiatal hernia. Lungs/Pleura: Biapical pleural parenchymal scarring. Postoperative changes of right upper lobectomy. Mild centrilobular emphysema. No pleural fluid. Narrowing at the distal bronchus intermedius and right middle lobe bronchus, as before. Airway is otherwise unremarkable. Musculoskeletal: No worrisome lytic or sclerotic lesions. CT ABDOMEN PELVIS FINDINGS Hepatobiliary: Liver is unremarkable. Small stones are seen in the gallbladder. No biliary ductal dilatation. Pancreas: Negative. Spleen: 4 mm  low-attenuation lesion in the lower aspect of the spleen is unchanged and likely a cyst or hemangioma. Adrenals/Urinary Tract: Adrenal glands are unremarkable. Renal cortical scarring bilaterally. Kidneys are otherwise unremarkable. Ureters are decompressed. Bladder is grossly unremarkable. Stomach/Bowel: Tiny hiatal hernia. Stomach, small bowel, appendix and colon are unremarkable. Vascular/Lymphatic: Atherosclerotic calcification of the arterial vasculature without abdominal aortic aneurysm. Abdominal peritoneal ligament and porta hepatis lymph nodes measure up to 1.5 cm in the portacaval station (series 2, image 60), stable. No additional pathologically enlarged lymph nodes. Reproductive: Hysterectomy.  Ovaries are visualized. Other: No free fluid. Mesenteries and peritoneum are unremarkable. No free fluid. Musculoskeletal: No worrisome lytic or sclerotic lesions. IMPRESSION: 1. Right upper lobectomy without evidence of recurrent or metastatic disease. 2. Coronary artery calcification. 3. Cholelithiasis. Electronically Signed   By: MeLorin Picket.D.   On: 06/13/2015 13:12   ASSESSMENT AND PLAN: This is a very pleasant [age] old white female recently diagnosed with metastatic non-small cell lung cancer, adenocarcinoma with negative EGFR mutation and negative ALK gene translocation. This was initially diagnosed as stage IB status post right upper lobectomy but the patient had evidence for disease recurrence in the right axilla status post resection. She is also status post 6 cycles of systemic chemotherapy with carboplatin and  Alimta. The patient tolerated her treatment fairly well with no significant adverse effects. The recent CT scan of the chest, abdomen and pelvis showed no evidence for disease progression. I discussed the scan and lab results with the patient today. I recommended for her to continue on observation with repeat CT scan of the chest in 3 months for reevaluation of her disease.  She  was also advised to call me immediately if she develop any jaundice or had any other significant complaints. The patient voices understanding of current disease status and treatment options and is in agreement with the current care plan.  All questions were answered. The patient knows to call the clinic with any problems, questions or concerns. We can certainly see the patient much sooner if necessary.  Disclaimer: This note was dictated with voice recognition software. Similar sounding words can inadvertently be transcribed and may not be corrected upon review.

## 2015-06-20 NOTE — Telephone Encounter (Signed)
Gave and printed appt sched and avs fo rpt for Feb 2017 °

## 2015-07-31 ENCOUNTER — Encounter: Payer: Self-pay | Admitting: *Deleted

## 2015-07-31 NOTE — Progress Notes (Signed)
[place] Work  Clinical Social Work was referred by patient for assessment of psychosocial needs due to need for gas card.  Clinical Social Worker attempted to contact patient at home to offer support and assess for needs.  CSW left vm to return CSW call.    Loren Racer, [place] Worker [place]  [place] Phone: [phone] Fax: [phone]

## 2015-07-31 NOTE — Progress Notes (Unsigned)
[place] Work  Clinical Social Work was referred by patient for assessment of psychosocial needs.  Clinical Education officer, museum (met with or contacted) patient (at [facility], at home, or in the hospital) to offer support and assess for needs.      Clinical Social Work interventions:  Polo Riley, MSW, LCSW, OSW-C Clinical Social Worker [facility] [phone]

## 2015-08-06 ENCOUNTER — Emergency Department (HOSPITAL_COMMUNITY)
Admission: EM | Admit: 2015-08-06 | Discharge: 2015-08-06 | Disposition: A | Discharge status: Home / Self Care | Payer: Medicare Other | Attending: Emergency Medicine | Admitting: Emergency Medicine

## 2015-08-06 ENCOUNTER — Encounter (HOSPITAL_COMMUNITY): Payer: Self-pay | Admitting: Emergency Medicine

## 2015-08-06 ENCOUNTER — Emergency Department (HOSPITAL_COMMUNITY): Payer: Medicare Other

## 2015-08-06 DIAGNOSIS — Z7901 Long term (current) use of anticoagulants: Secondary | ICD-10-CM | POA: Diagnosis not present

## 2015-08-06 DIAGNOSIS — I480 Paroxysmal atrial fibrillation: Secondary | ICD-10-CM | POA: Insufficient documentation

## 2015-08-06 DIAGNOSIS — J45909 Unspecified asthma, uncomplicated: Secondary | ICD-10-CM | POA: Diagnosis not present

## 2015-08-06 DIAGNOSIS — H9203 Otalgia, bilateral: Secondary | ICD-10-CM | POA: Insufficient documentation

## 2015-08-06 DIAGNOSIS — E119 Type 2 diabetes mellitus without complications: Secondary | ICD-10-CM | POA: Diagnosis not present

## 2015-08-06 DIAGNOSIS — Z8659 Personal history of other mental and behavioral disorders: Secondary | ICD-10-CM | POA: Insufficient documentation

## 2015-08-06 DIAGNOSIS — R0981 Nasal congestion: Secondary | ICD-10-CM | POA: Insufficient documentation

## 2015-08-06 DIAGNOSIS — Z85118 Personal history of other malignant neoplasm of bronchus and lung: Secondary | ICD-10-CM | POA: Insufficient documentation

## 2015-08-06 DIAGNOSIS — Z880 Allergy status to penicillin: Secondary | ICD-10-CM | POA: Diagnosis not present

## 2015-08-06 DIAGNOSIS — Z8719 Personal history of other diseases of the digestive system: Secondary | ICD-10-CM | POA: Insufficient documentation

## 2015-08-06 DIAGNOSIS — Z87442 Personal history of urinary calculi: Secondary | ICD-10-CM | POA: Diagnosis not present

## 2015-08-06 DIAGNOSIS — Z950 Presence of cardiac pacemaker: Secondary | ICD-10-CM | POA: Diagnosis not present

## 2015-08-06 DIAGNOSIS — R42 Dizziness and giddiness: Secondary | ICD-10-CM | POA: Diagnosis not present

## 2015-08-06 DIAGNOSIS — Z87891 Personal history of nicotine dependence: Secondary | ICD-10-CM | POA: Insufficient documentation

## 2015-08-06 DIAGNOSIS — M7981 Nontraumatic hematoma of soft tissue: Secondary | ICD-10-CM | POA: Insufficient documentation

## 2015-08-06 DIAGNOSIS — Z79899 Other long term (current) drug therapy: Secondary | ICD-10-CM | POA: Diagnosis not present

## 2015-08-06 LAB — CBC WITH DIFFERENTIAL/PLATELET
Basophils Absolute: 0 10*3/uL (ref 0.0–0.1)
Basophils Relative: 1 %
EOS ABS: 0.1 10*3/uL (ref 0.0–0.7)
EOS PCT: 2 %
HCT: 33.8 % — ABNORMAL LOW (ref 36.0–46.0)
HEMOGLOBIN: 11.7 g/dL — AB (ref 12.0–15.0)
LYMPHS ABS: 1.2 10*3/uL (ref 0.7–4.0)
LYMPHS PCT: 24 %
MCH: 34.1 pg — AB (ref 26.0–34.0)
MCHC: 34.6 g/dL (ref 30.0–36.0)
MCV: 98.5 fL (ref 78.0–100.0)
MONOS PCT: 9 %
Monocytes Absolute: 0.4 10*3/uL (ref 0.1–1.0)
Neutro Abs: 3.2 10*3/uL (ref 1.7–7.7)
Neutrophils Relative %: 64 %
PLATELETS: 172 10*3/uL (ref 150–400)
RBC: 3.43 MIL/uL — AB (ref 3.87–5.11)
RDW: 14.2 % (ref 11.5–15.5)
WBC: 5 10*3/uL (ref 4.0–10.5)

## 2015-08-06 LAB — COMPREHENSIVE METABOLIC PANEL
ALK PHOS: 116 U/L (ref 38–126)
ALT: 25 U/L (ref 14–54)
AST: 37 U/L (ref 15–41)
Albumin: 4 g/dL (ref 3.5–5.0)
Anion gap: 6 (ref 5–15)
BUN: 17 mg/dL (ref 6–20)
CALCIUM: 9.7 mg/dL (ref 8.9–10.3)
CO2: 29 mmol/L (ref 22–32)
CREATININE: 0.66 mg/dL (ref 0.44–1.00)
Chloride: 106 mmol/L (ref 101–111)
Glucose, Bld: 151 mg/dL — ABNORMAL HIGH (ref 65–99)
Potassium: 4.3 mmol/L (ref 3.5–5.1)
SODIUM: 141 mmol/L (ref 135–145)
Total Bilirubin: 2 mg/dL — ABNORMAL HIGH (ref 0.3–1.2)
Total Protein: 7.1 g/dL (ref 6.5–8.1)

## 2015-08-06 MED ORDER — SODIUM CHLORIDE 0.9 % IV BOLUS (SEPSIS)
500.0000 mL | Freq: Once | INTRAVENOUS | Status: AC
  Administered 2015-08-06: 500 mL via INTRAVENOUS

## 2015-08-06 MED ORDER — ONDANSETRON HCL 4 MG/2ML IJ SOLN
4.0000 mg | Freq: Once | INTRAMUSCULAR | Status: AC
  Administered 2015-08-06: 4 mg via INTRAVENOUS
  Filled 2015-08-06: qty 2

## 2015-08-06 MED ORDER — SODIUM CHLORIDE 0.9 % IV SOLN
INTRAVENOUS | Status: DC
  Administered 2015-08-06: 14:00:00 via INTRAVENOUS

## 2015-08-06 MED ORDER — MECLIZINE HCL 12.5 MG PO TABS
25.0000 mg | ORAL_TABLET | Freq: Once | ORAL | Status: AC
  Administered 2015-08-06: 25 mg via ORAL
  Filled 2015-08-06 (×2): qty 2

## 2015-08-06 NOTE — Discharge Instructions (Signed)
Recommend taking the anti-of her on a regular basis. Recommend holding on the antibiotic prescription you get from the urgent care. Make an appointment to follow-up with your hematologist oncologist for follow-up of the lung lymph node identified. Return for any new or worse symptoms. At the vertigo symptoms persist further evaluation would normally mean MRI but should not candidate for MRI because of the pacemaker.

## 2015-08-06 NOTE — ED Notes (Signed)
Pt called for Triage, no answer. Pt not in waiting area.

## 2015-08-06 NOTE — ED Provider Notes (Signed)
CSN: [id number]     Arrival date & time 08/06/15  1211 History   First MD Initiated Contact with Patient 08/06/15 1333     Chief Complaint  Patient presents with  . Dizziness     (Consider location/radiation/quality/duration/timing/severity/associated sxs/prior Treatment) Patient is a 70 y.o. female presenting with dizziness. The history is provided by the patient.  Dizziness Associated symptoms: no chest pain, no diarrhea, no headaches, no nausea, no shortness of breath, no vomiting and no weakness    patient sent from urgent care for concerns for dehydration vertigo. Patient's had episodes of dizziness for a couple weeks with room spinning. Was worse yesterday. Also with a history of cough congestion. Also history of some bilateral ear pain that comes and goes. Patient with past history of lung CA with resection. Patient also has a pacemaker and patient is on Xarelto for atrial fibrillation.  Past Medical History  Diagnosis Date  . Asthma   . Allergic rhinitis   . History of kidney stones   . H/O hiatal hernia   . GERD (gastroesophageal reflux disease)   . Pulmonary nodule   . Paroxysmal atrial fibrillation (HCC)     a. anticoagulated with Xarelto  . Atrial flutter ([place])   . Tachycardia-bradycardia syndrome ([place])     a. 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  [name] Anxiety   . Diverticulosis   . Internal hemorrhoid   . Arthritis   . Lung cancer ([place])     a. Stage IB non-small cell carcinoma, s/p R VATS, wedge resection, RU lobectomy 10/2012.  . Diabetes mellitus without complication ([place])     steroid induced from chemo   Past Surgical History  Procedure Laterality Date  . Partial hysterectomy    . Video assisted thoracoscopy (vats)/wedge resection Right 10/27/2012    Procedure: VIDEO ASSISTED THORACOSCOPY (VATS),RGHT UPPER LOBE LUNG WEDGE RESECTION;  Surgeon: [name], MD;  Location: [place];  Service: Thoracic;  Laterality: Right;  . Lobectomy Right 10/27/2012     Procedure: LOBECTOMY;  Surgeon: [name], MD;  Location: [place];  Service: Thoracic;  Laterality: Right;   RIGHT UPPER LOBECTOMY & Node Dissection  . Colonoscopy with polypectomy  2015  . Lymph node biopsy Right 05/01/2014    Procedure: LYMPH NODE BIOPSY, Right Axillary;  Surgeon: [name], MD;  Location: [facility];  Service: Thoracic;  Laterality: Right;  . Permanent pacemaker insertion N/A 09/14/2012    Nanostim (SJM) leadless pacemaker (LEADLESS II STUDY PATIENT) implanted by Dr [name]   Family History  Problem Relation Age of Onset  . Heart disease Mother   . Cancer Mother   . Diabetes Mother   . Asthma Father   . Heart disease Father   . Cancer Brother   . Diabetes Sister   . Cancer Sister   . Diabetes Brother   . Heart attack Son    Social History  Substance Use Topics  . Smoking status: Former Smoker -- 2.00 packs/day for 40 years    Types: Cigarettes    Quit date: 08/05/1995  . Smokeless tobacco: Never Used  . Alcohol Use: No   OB History    Gravida Para Term Preterm AB TAB SAB Ectopic Multiple Living   '1 1 1            '$ Review of Systems  Constitutional: Negative for fever.  HENT: Positive for congestion and ear pain.   Eyes: Negative for redness.  Respiratory: Negative for shortness of breath.  Cardiovascular: Negative for chest pain.  Gastrointestinal: Negative for nausea, vomiting, abdominal pain and diarrhea.  Genitourinary: Negative for dysuria.  Musculoskeletal: Negative for back pain.  Skin: Negative for rash.  Neurological: Positive for dizziness. Negative for seizures, syncope, speech difficulty, weakness, numbness and headaches.  Hematological: Bruises/bleeds easily.  Psychiatric/Behavioral: Negative for confusion.      Allergies  Prednisone; Septra; Penicillins; and Vioxx  Home Medications   Prior to Admission medications   Medication Sig Start Date End Date Taking? Authorizing Provider  acetaminophen (TYLENOL) 500 MG  tablet Take 500 mg by mouth every 6 (six) hours as needed for moderate pain or fever.    Yes Historical Provider, MD  albuterol (PROVENTIL HFA;VENTOLIN HFA) 108 (90 BASE) MCG/ACT inhaler Inhale 1-2 puffs into the lungs every 6 (six) hours as needed for wheezing or shortness of breath.   Yes Historical Provider, MD  albuterol (PROVENTIL) (2.5 MG/3ML) 0.083% nebulizer solution Take 3 mLs (2.5 mg total) by nebulization every 2 (two) hours as needed for shortness of breath. 01/24/13  Yes Brand Males, MD  amiodarone (PACERONE) 100 MG tablet Take 1 tablet (100 mg total) by mouth daily. 05/02/15  Yes [name], MD  diltiazem (CARDIZEM CD) 240 MG 24 hr capsule Take 1 capsule (240 mg total) by mouth daily. 04/11/15  Yes [name], MD  fexofenadine (ALLEGRA) 180 MG tablet Take 180 mg by mouth daily.   Yes Historical Provider, MD  montelukast (SINGULAIR) 10 MG tablet Take 10 mg by mouth at bedtime.  03/10/15  Yes Historical Provider, MD  rivaroxaban (XARELTO) 20 MG TABS tablet Take 1 tablet (20 mg total) by mouth daily with supper. 04/11/15  Yes [name], MD   BP 149/54 mmHg  Pulse 69  Temp(Src) 98.4 F (36.9 C) (Tympanic)  Resp 17  Ht '5\' 2"'$  (1.575 m)  Wt 70.308 kg  BMI 28.34 kg/m2  SpO2 99% Physical Exam  Constitutional: She appears well-developed and well-nourished. No distress.  HENT:  Head: Normocephalic and atraumatic.  Right Ear: External ear normal.  Left Ear: External ear normal.  Mouth/Throat: Oropharynx is clear and moist.  Eyes: Conjunctivae and EOM are normal. Pupils are equal, round, and reactive to light.  Neck: Normal range of motion. Neck supple.  Cardiovascular: Normal rate, regular rhythm and normal heart sounds.   No murmur heard. Pulmonary/Chest: Effort normal and breath sounds normal. No respiratory distress.  Abdominal: Soft. Bowel sounds are normal. There is no tenderness.  Musculoskeletal: Normal range of motion.  Neurological: She is alert. No cranial nerve  deficit. She exhibits normal muscle tone. Coordination normal.  Skin: Skin is warm. No rash noted.  Nursing note and vitals reviewed.   ED Course  Procedures (including critical care time) Labs Review Labs Reviewed  CBC WITH DIFFERENTIAL/PLATELET - Abnormal; Notable for the following:    RBC 3.43 (*)    Hemoglobin 11.7 (*)    HCT 33.8 (*)    MCH 34.1 (*)    All other components within normal limits  COMPREHENSIVE METABOLIC PANEL - Abnormal; Notable for the following:    Glucose, Bld 151 (*)    Total Bilirubin 2.0 (*)    All other components within normal limits   Results for orders placed or performed during the hospital encounter of 08/06/15  CBC with Differential  Result Value Ref Range   WBC 5.0 4.0 - 10.5 K/uL   RBC 3.43 (L) 3.87 - 5.11 MIL/uL   Hemoglobin 11.7 (L) 12.0 - 15.0 g/dL   HCT  33.8 (L) 36.0 - 46.0 %   MCV 98.5 78.0 - 100.0 fL   MCH 34.1 (H) 26.0 - 34.0 pg   MCHC 34.6 30.0 - 36.0 g/dL   RDW 14.2 11.5 - 15.5 %   Platelets 172 150 - 400 K/uL   Neutrophils Relative % 64 %   Neutro Abs 3.2 1.7 - 7.7 K/uL   Lymphocytes Relative 24 %   Lymphs Abs 1.2 0.7 - 4.0 K/uL   Monocytes Relative 9 %   Monocytes Absolute 0.4 0.1 - 1.0 K/uL   Eosinophils Relative 2 %   Eosinophils Absolute 0.1 0.0 - 0.7 K/uL   Basophils Relative 1 %   Basophils Absolute 0.0 0.0 - 0.1 K/uL  Comprehensive metabolic panel  Result Value Ref Range   Sodium 141 135 - 145 mmol/L   Potassium 4.3 3.5 - 5.1 mmol/L   Chloride 106 101 - 111 mmol/L   CO2 29 22 - 32 mmol/L   Glucose, Bld 151 (H) 65 - 99 mg/dL   BUN 17 6 - 20 mg/dL   Creatinine, Ser 0.66 0.44 - 1.00 mg/dL   Calcium 9.7 8.9 - 10.3 mg/dL   Total Protein 7.1 6.5 - 8.1 g/dL   Albumin 4.0 3.5 - 5.0 g/dL   AST 37 15 - 41 U/L   ALT 25 14 - 54 U/L   Alkaline Phosphatase 116 38 - 126 U/L   Total Bilirubin 2.0 (H) 0.3 - 1.2 mg/dL   GFR calc non Af Amer >60 >60 mL/min   GFR calc Af Amer >60 >60 mL/min   Anion gap 6 5 - 15      Imaging Review Ct Head Wo Contrast  08/06/2015  CLINICAL DATA:  Cough and congestion for 2 weeks. Dizziness with sinusitis. No known injury. Lung cancer. EXAM: CT HEAD WITHOUT CONTRAST TECHNIQUE: Contiguous axial images were obtained from the base of the skull through the vertex without intravenous contrast. COMPARISON:  CT Chest reported separately. PET scan 05/17/2014. CT head 05/17/2014. FINDINGS: No evidence for acute infarction, hemorrhage, mass lesion, hydrocephalus, or extra-axial fluid. Mild to moderate atrophy. Chronic microvascular ischemic change. Remote RIGHT cerebellar infarct. No acute osseous findings. Sinuses and mastoids clear. Negative orbits. Similar appearance to priors. IMPRESSION: No intracranial metastatic disease is detected on this noncontrast scan. Electronically Signed   By: Staci Righter M.D.   On: 08/06/2015 15:25   Ct Chest Wo Contrast  08/06/2015  CLINICAL DATA:  Cough and congestion for 2 weeks. History of lung cancer. EXAM: CT CHEST WITHOUT CONTRAST TECHNIQUE: Multidetector CT imaging of the chest was performed following the standard protocol without IV contrast. COMPARISON:  06/13/2015 FINDINGS: Mediastinum/Nodes: No supraclavicular adenopathy. Aortic and branch vessel atherosclerosis. Normal heart size, without pericardial effusion. Lad and left circumflex coronary artery atherosclerosis. No mediastinal or definite hilar adenopathy, given limitations of unenhanced CT. Tiny hiatal hernia. Lungs/Pleura: No pleural fluid. Status post right upper lobectomy. Similar narrowing of the distal bronchus intermedius and origin of the right middle lobe bronchus. Example image 26/series 3. Right base scarring. A subpleural left lower lobe 3 mm nodule on image 28/series 3 is favored to represent a subpleural lymph node. Not readily apparent on 03/14/2015 or 06/13/2015. Upper abdomen: Old granulomatous disease in the liver. Cholelithiasis. Suspect mild splenomegaly, as before. Normal  imaged portions of the pancreas, adrenal glands, kidneys. Prominent porta hepatis nodes are not significantly changed and favored to be reactive. Musculoskeletal: No acute osseous abnormality. IMPRESSION: 1.  No acute process in the chest.  2. Status post right upper lobectomy. 3. Tiny left lower lobe pulmonary nodule is favored to represent a subpleural lymph node but is not readily apparent on prior exams. Recommend attention on follow-up. 4.  Atherosclerosis, including within the coronary arteries. 5. Cholelithiasis. Electronically Signed   By: Abigail Miyamoto M.D.   On: 08/06/2015 15:31   I have personally reviewed and evaluated these images and lab results as part of my medical decision-making.   EKG Interpretation   Date/Time:  Monday August 06 2015 13:39:36 EST Ventricular Rate:  70 PR Interval:  178 QRS Duration: 97 QT Interval:  417 QTC Calculation: 450 R Axis:   64 Text Interpretation:  Sinus rhythm RSR' in V1 or V2, probably normal  variant wandering baseline Confirmed by Seiya Silsby  MD, Corderius Saraceni [phone]) on  08/06/2015 2:15:50 PM      MDM   Final diagnoses:  Vertigo    Patient with history of bronchitis upper respiratory infection no evidence of ear infection. Patient clearly with vertigo workup for the vertigo includes a negative head CT. EKG without any significant arrhythmias. CT of chest shows no definite evidence of pneumonia or recurrent neoplasm. There is evidence of a tiny L lower lobe pulmonary nodule that will require follow-up. Patient will notify her hematology oncology doctor of this. Patient was significant improvement in her symptoms with Antivert. That will be continued. Patient will follow-up with her doctors will return for any new or worse symptoms. Labs without significant abnormalities.    Fredia Sorrow, MD 08/06/15 [phone]

## 2015-08-06 NOTE — ED Notes (Signed)
Pt seen by PCP this am, sent for eval for dehydration. Pt has sinusitis, has had dizziness x 2 weeks. Pt had CXR at PCP this am.

## 2015-08-07 ENCOUNTER — Telehealth: Payer: Self-pay | Admitting: *Deleted

## 2015-08-07 NOTE — Telephone Encounter (Signed)
Oncology Nurse Navigator Documentation  Oncology Nurse Navigator Flowsheets 08/07/2015  Navigator Encounter Type Telephone/patient called.  She had CT scan yesterday and was concerned about results.  She asked that I speak with Dr. Julien Nordmann.  I did. He stated everything looks ok and can see on follow up in Feb. I called patient back to update her.  She was thankful for the call.   Patient Visit Type Follow-up  Treatment Phase Other  Interventions Coordination of Care  Coordination of Care Other  Time Spent with Patient 15

## 2015-08-22 ENCOUNTER — Encounter: Payer: Self-pay | Admitting: *Deleted

## 2015-08-22 ENCOUNTER — Encounter: Payer: Self-pay | Admitting: Internal Medicine

## 2015-08-22 DIAGNOSIS — Z006 Encounter for examination for normal comparison and control in clinical research program: Secondary | ICD-10-CM

## 2015-08-22 NOTE — Progress Notes (Signed)
Patient called wanting itemized bills form 02/15-current. Gave patient number to billing at [phone].

## 2015-08-22 NOTE — Progress Notes (Signed)
Patient was seen for Leadless pacemaker battery check.The device did not have any issues and battery looked good. The patient is not pacer dependent so the next check will be in March at pacer clinic.

## 2015-09-14 ENCOUNTER — Encounter: Payer: Self-pay | Admitting: Internal Medicine

## 2015-09-19 ENCOUNTER — Other Ambulatory Visit (HOSPITAL_BASED_OUTPATIENT_CLINIC_OR_DEPARTMENT_OTHER): Payer: Medicare Other

## 2015-09-19 ENCOUNTER — Encounter (HOSPITAL_COMMUNITY): Payer: Self-pay

## 2015-09-19 ENCOUNTER — Ambulatory Visit (HOSPITAL_COMMUNITY)
Admission: RE | Admit: 2015-09-19 | Discharge: 2015-09-19 | Disposition: A | Discharge status: Home / Self Care | Payer: Medicare Other | Source: Ambulatory Visit | Attending: Internal Medicine | Admitting: Internal Medicine

## 2015-09-19 DIAGNOSIS — R911 Solitary pulmonary nodule: Secondary | ICD-10-CM | POA: Diagnosis not present

## 2015-09-19 DIAGNOSIS — K760 Fatty (change of) liver, not elsewhere classified: Secondary | ICD-10-CM | POA: Insufficient documentation

## 2015-09-19 DIAGNOSIS — K8020 Calculus of gallbladder without cholecystitis without obstruction: Secondary | ICD-10-CM | POA: Diagnosis not present

## 2015-09-19 DIAGNOSIS — C3411 Malignant neoplasm of upper lobe, right bronchus or lung: Secondary | ICD-10-CM | POA: Diagnosis present

## 2015-09-19 DIAGNOSIS — J439 Emphysema, unspecified: Secondary | ICD-10-CM | POA: Insufficient documentation

## 2015-09-19 DIAGNOSIS — C3491 Malignant neoplasm of unspecified part of right bronchus or lung: Secondary | ICD-10-CM | POA: Diagnosis not present

## 2015-09-19 DIAGNOSIS — D71 Functional disorders of polymorphonuclear neutrophils: Secondary | ICD-10-CM | POA: Insufficient documentation

## 2015-09-19 DIAGNOSIS — R161 Splenomegaly, not elsewhere classified: Secondary | ICD-10-CM | POA: Insufficient documentation

## 2015-09-19 DIAGNOSIS — K449 Diaphragmatic hernia without obstruction or gangrene: Secondary | ICD-10-CM | POA: Insufficient documentation

## 2015-09-19 DIAGNOSIS — I7090 Unspecified atherosclerosis: Secondary | ICD-10-CM | POA: Diagnosis not present

## 2015-09-19 LAB — COMPREHENSIVE METABOLIC PANEL
ALT: 26 U/L (ref 0–55)
AST: 40 U/L — AB (ref 5–34)
Albumin: 3.8 g/dL (ref 3.5–5.0)
Alkaline Phosphatase: 131 U/L (ref 40–150)
Anion Gap: 7 mEq/L (ref 3–11)
BUN: 11.7 mg/dL (ref 7.0–26.0)
CHLORIDE: 107 meq/L (ref 98–109)
CO2: 28 meq/L (ref 22–29)
Calcium: 9.4 mg/dL (ref 8.4–10.4)
Creatinine: 0.8 mg/dL (ref 0.6–1.1)
EGFR: 73 mL/min/{1.73_m2} — ABNORMAL LOW (ref >90)
GLUCOSE: 129 mg/dL (ref 70–140)
Potassium: 4.3 mEq/L (ref 3.5–5.1)
SODIUM: 142 meq/L (ref 136–145)
Total Bilirubin: 2.23 mg/dL — ABNORMAL HIGH (ref 0.20–1.20)
Total Protein: 7.1 g/dL (ref 6.4–8.3)

## 2015-09-19 LAB — CBC WITH DIFFERENTIAL/PLATELET
BASO%: 1 % (ref 0.0–2.0)
BASOS ABS: 0 10*3/uL (ref 0.0–0.1)
EOS ABS: 0.1 10*3/uL (ref 0.0–0.5)
EOS%: 3.8 % (ref 0.0–7.0)
HEMATOCRIT: 34.8 % (ref 34.8–46.6)
HGB: 12 g/dL (ref 11.6–15.9)
LYMPH#: 1.1 10*3/uL (ref 0.9–3.3)
LYMPH%: 28.8 % (ref 14.0–49.7)
MCH: 34.3 pg — AB (ref 25.1–34.0)
MCHC: 34.6 g/dL (ref 31.5–36.0)
MCV: 99 fL (ref 79.5–101.0)
MONO#: 0.3 10*3/uL (ref 0.1–0.9)
MONO%: 7.9 % (ref 0.0–14.0)
NEUT#: 2.3 10*3/uL (ref 1.5–6.5)
NEUT%: 58.5 % (ref 38.4–76.8)
PLATELETS: 185 10*3/uL (ref 145–400)
RBC: 3.51 10*6/uL — ABNORMAL LOW (ref 3.70–5.45)
RDW: 14.9 % — ABNORMAL HIGH (ref 11.2–14.5)
WBC: 3.9 10*3/uL (ref 3.9–10.3)

## 2015-09-19 MED ORDER — IOHEXOL 300 MG/ML  SOLN
75.0000 mL | Freq: Once | INTRAMUSCULAR | Status: AC | PRN
  Administered 2015-09-19: 75 mL via INTRAVENOUS

## 2015-09-26 ENCOUNTER — Telehealth: Payer: Self-pay | Admitting: Internal Medicine

## 2015-09-26 ENCOUNTER — Encounter: Payer: Self-pay | Admitting: Internal Medicine

## 2015-09-26 ENCOUNTER — Ambulatory Visit (HOSPITAL_BASED_OUTPATIENT_CLINIC_OR_DEPARTMENT_OTHER): Payer: Medicare Other | Admitting: Internal Medicine

## 2015-09-26 VITALS — BP 136/55 | HR 70 | Temp 97.8°F | Resp 18 | Ht 62.0 in | Wt 154.1 lb

## 2015-09-26 DIAGNOSIS — C773 Secondary and unspecified malignant neoplasm of axilla and upper limb lymph nodes: Secondary | ICD-10-CM | POA: Diagnosis present

## 2015-09-26 DIAGNOSIS — K829 Disease of gallbladder, unspecified: Secondary | ICD-10-CM | POA: Diagnosis not present

## 2015-09-26 DIAGNOSIS — K8020 Calculus of gallbladder without cholecystitis without obstruction: Secondary | ICD-10-CM | POA: Insufficient documentation

## 2015-09-26 DIAGNOSIS — C3411 Malignant neoplasm of upper lobe, right bronchus or lung: Secondary | ICD-10-CM | POA: Diagnosis not present

## 2015-09-26 DIAGNOSIS — C3491 Malignant neoplasm of unspecified part of right bronchus or lung: Secondary | ICD-10-CM

## 2015-09-26 DIAGNOSIS — K8010 Calculus of gallbladder with chronic cholecystitis without obstruction: Secondary | ICD-10-CM

## 2015-09-26 HISTORY — DX: Calculus of gallbladder without cholecystitis without obstruction: K80.20

## 2015-09-26 NOTE — Progress Notes (Signed)
[facility] Telephone:([phone]) [phone]   Fax:([phone]) [phone]  OFFICE PROGRESS NOTE  Nancy Fairy, Nancy Hodges [address] [place] [address]  DIAGNOSIS: Metastatic non-small cell lung cancer, adenocarcinoma. This was initially diagnosed as stage IB (T2a, N0, M0) In March of 2014, status post right upper lobectomy with lymph node dissection but the patient has evidence for disease recurrence in the right axilla status post resection.  PRIOR THERAPY:  1) Status post right axillary lymph node biopsy on 05/01/2014 under the care of Dr. Roxan Hockey. 2) Systemic chemotherapy with carboplatin for AUC of 5 and Alimta 500 mg/M2 every 3 weeks. First cycle 05/31/2014. Status post 6 cycles. Last dose was given 09/22/2014.   CURRENT THERAPY: None.  INTERVAL HISTORY: Nancy Hodges 70 y.o. female returns to the clinic today for followup visit. She is feeling fine today with no specific complaints except for intermittent right upper quadrant abdominal pain. She has a history of cholelithiasis but has not seen a surgeon yet. She is also on amiodarone for irregular heart rate. She denied having any significant chest pain, shortness of breath, cough or hemoptysis. She has no weight loss or night sweats. She denied having any significant nausea or vomiting, no fever or chills. She had repeat CT scan of the chest, abdomen and pelvis performed recently and she is here for evaluation and discussion of her scan results.  MEDICAL HISTORY: Past Medical History  Diagnosis Date  . Asthma   . Allergic rhinitis   . History of kidney stones   . H/O hiatal hernia   . GERD (gastroesophageal reflux disease)   . Pulmonary nodule   . Paroxysmal atrial fibrillation (HCC)     a. anticoagulated with Xarelto  . Atrial flutter ([place])   . Tachycardia-bradycardia syndrome ([place])     a. 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  [name] Anxiety   . Diverticulosis   . Internal hemorrhoid   . Arthritis     . Lung cancer ([place])     a. Stage IB non-small cell carcinoma, s/p R VATS, wedge resection, RU lobectomy 10/2012.  . Diabetes mellitus without complication (HCC)     steroid induced from chemo    ALLERGIES:  is allergic to prednisone; septra; penicillins; and vioxx.  MEDICATIONS:  Current Outpatient Prescriptions  Medication Sig Dispense Refill  . acetaminophen (TYLENOL) 500 MG tablet Take 500 mg by mouth every 6 (six) hours as needed for moderate pain or fever.     [name] albuterol (PROVENTIL HFA;VENTOLIN HFA) 108 (90 BASE) MCG/ACT inhaler Inhale 1-2 puffs into the lungs every 6 (six) hours as needed for wheezing or shortness of breath.    [name] albuterol (PROVENTIL) (2.5 MG/3ML) 0.083% nebulizer solution Take 3 mLs (2.5 mg total) by nebulization every 2 (two) hours as needed for shortness of breath. 120 mL 0  . amiodarone (PACERONE) 100 MG tablet Take 1 tablet (100 mg total) by mouth daily. 90 tablet 3  . diltiazem (CARDIZEM CD) 240 MG 24 hr capsule Take 1 capsule (240 mg total) by mouth daily. 30 capsule 11  . fexofenadine (ALLEGRA) 180 MG tablet Take 180 mg by mouth daily.    . montelukast (SINGULAIR) 10 MG tablet Take 10 mg by mouth at bedtime.   5  . rivaroxaban (XARELTO) 20 MG TABS tablet Take 1 tablet (20 mg total) by mouth daily with supper. 30 tablet 11   No current facility-administered medications for this visit.    SURGICAL HISTORY:  Past Surgical History  Procedure Laterality Date  . Partial hysterectomy    . Video assisted thoracoscopy (vats)/wedge resection Right 10/27/2012    Procedure: VIDEO ASSISTED THORACOSCOPY (VATS),RGHT UPPER LOBE LUNG WEDGE RESECTION;  Surgeon: [name], MD;  Location: [facility];  Service: Thoracic;  Laterality: Right;  . Lobectomy Right 10/27/2012    Procedure: LOBECTOMY;  Surgeon: [name], MD;  Location: [facility];  Service: Thoracic;  Laterality: Right;   RIGHT UPPER LOBECTOMY & Node Dissection  . Colonoscopy with polypectomy  2015  .  Lymph node biopsy Right 05/01/2014    Procedure: LYMPH NODE BIOPSY, Right Axillary;  Surgeon: [name], MD;  Location: [facility];  Service: Thoracic;  Laterality: Right;  . Permanent pacemaker insertion N/A 09/14/2012    Nanostim (SJM) leadless pacemaker (LEADLESS II STUDY PATIENT) implanted by Dr [name]    REVIEW OF SYSTEMS:  A comprehensive review of systems was negative except for: Constitutional: positive for fatigue Gastrointestinal: positive for abdominal pain   PHYSICAL EXAMINATION: General appearance: alert, cooperative and no distress Head: Normocephalic, without obvious abnormality, atraumatic Neck: no adenopathy, no JVD, supple, symmetrical, trachea midline and thyroid not enlarged, symmetric, no tenderness/mass/nodules Lymph nodes: Cervical, supraclavicular, and axillary nodes normal. Resp: clear to auscultation bilaterally Back: symmetric, no curvature. ROM normal. No CVA tenderness. Cardio: regular rate and rhythm, S1, S2 normal, no murmur, click, rub or gallop GI: soft, non-tender; bowel sounds normal; no masses,  no organomegaly Extremities: extremities normal, atraumatic, no cyanosis or edema Neurologic: Alert and oriented X 3, normal strength and tone. Normal symmetric reflexes. Normal coordination and gait  ECOG PERFORMANCE STATUS: 1 - Symptomatic but completely ambulatory  There were no vitals taken for this visit.  LABORATORY DATA: Lab Results  Component Value Date   WBC 3.9 09/19/2015   HGB 12.0 09/19/2015   HCT 34.8 09/19/2015   MCV 99.0 09/19/2015   PLT 185 09/19/2015      Chemistry      Component Value Date/Time   NA 142 09/19/2015 0958   NA 141 08/06/2015 1332   K 4.3 09/19/2015 0958   K 4.3 08/06/2015 1332   CL 106 08/06/2015 1332   CO2 28 09/19/2015 0958   CO2 29 08/06/2015 1332   BUN 11.7 09/19/2015 0958   BUN 17 08/06/2015 1332   CREATININE 0.8 09/19/2015 0958   CREATININE 0.66 08/06/2015 1332      Component Value Date/Time    CALCIUM 9.4 09/19/2015 0958   CALCIUM 9.7 08/06/2015 1332   ALKPHOS 131 09/19/2015 0958   ALKPHOS 116 08/06/2015 1332   AST 40* 09/19/2015 0958   AST 37 08/06/2015 1332   ALT 26 09/19/2015 0958   ALT 25 08/06/2015 1332   BILITOT 2.23* 09/19/2015 0958   BILITOT 2.0* 08/06/2015 1332       RADIOGRAPHIC STUDIES: Ct Chest W Contrast  09/19/2015  CLINICAL DATA:  Right lung cancer restaging. Right upper lobectomy. Intermittent right-sided back pain. EXAM: CT CHEST WITH CONTRAST TECHNIQUE: Multidetector CT imaging of the chest was performed during intravenous contrast administration. CONTRAST:  36m OMNIPAQUE IOHEXOL 300 MG/ML  SOLN COMPARISON:  08/06/2015 FINDINGS: Mediastinum/Nodes: Right internal mammary node 0.8 cm in short axis on image 21 series 2, formerly 0.7 cm. Left internal mammary node 0.5 cm in short axis on image 22 series 2, stable. Small type 1 hiatal hernia.  Right axillary node dissection. Coronary, aortic arch, and branch vessel atherosclerotic vascular disease. Lungs/Pleura: Right upper lobectomy. Stable scarring in the right  middle lobe and peripherally in the right lower lobe. Stable scarring posteriorly in the right lower lobe with adjacent slight pleural thickening. Emphysema noted. 3 mm left lower lobe nodule, image 24 series 5, present on 06/13/2015 but not well seen prior to that, merits continued attention. Narrowing of the right middle lobe bronchus is again observed. Upper abdomen: Old granulomatous disease involving the liver. Cholelithiasis. Diffuse hepatic steatosis. Small porta systemic vascular shunt adjacent to the falciform ligament. Gastrohepatic and peripancreatic lymph nodes are mildly prominent and stable. Index node anterior to the IVC on image 56 series 2 measures 10 mm in diameter. Borderline splenomegaly. Musculoskeletal: Mild upper thoracic spondylosis. IMPRESSION: 1. The 3 mm left lower lobe nodule is stable. This was present on 06/13/2015 but not well seen  prior to that. It merits continued attention. There no specific guidelines for follow up in this setting, but consider a three-month follow up chest CT. 2. Continued mild prominence of porta hepatis and peripancreatic lymph nodes, similar to prior. Also slightly prominent internal mammary lymph nodes, similar to prior. Continued observation suggested. 3. Right upper lobectomy. Narrowing of the right middle lobe bronchus, stable, likely incidental. 4. Other imaging findings of potential clinical significance: Small type 1 hiatal hernia. Coronary, aortic arch, and branch vessel atherosclerotic vascular disease. Emphysema. Old granulomatous disease involving the liver. Cholelithiasis. Diffuse hepatic steatosis. Small portosystemic shunt noted extending near the falciform ligament. Borderline splenomegaly. Electronically Signed   By: Van Clines M.D.   On: 09/19/2015 11:46   ASSESSMENT AND PLAN: This is a very pleasant 70 years old white female recently diagnosed with metastatic non-small cell lung cancer, adenocarcinoma with negative EGFR mutation and negative ALK gene translocation. This was initially diagnosed as stage IB status post right upper lobectomy but the patient had evidence for disease recurrence in the right axilla status post resection. She is also status post 6 cycles of systemic chemotherapy with carboplatin and Alimta. The patient tolerated her treatment fairly well with no significant adverse effects. The recent CT scan of the chest, abdomen and pelvis showed no evidence for disease progression. There is before meals millimeter left lower lobe nodule that need continuous observation. Her serum bilirubin continues to increase and this is most likely secondary to treatment with amiodarone as well as cholelithiasis. I recommended for the patient to see a general surgeon in [place] for evaluation of her gallbladder disease. She will also discuss her treatment with amiodarone with her  cardiologist because of the liver toxicity. I discussed the scan and lab results with the patient today. I recommended for her to continue on observation with repeat CT scan of the chest in 3 months for reevaluation of her disease.  She was also advised to call me immediately if she develop any jaundice or had any other significant complaints. The patient voices understanding of current disease status and treatment options and is in agreement with the current care plan.  All questions were answered. The patient knows to call the clinic with any problems, questions or concerns. We can certainly see the patient much sooner if necessary.  Disclaimer: This note was dictated with voice recognition software. Similar sounding words can inadvertently be transcribed and may not be corrected upon review.

## 2015-09-26 NOTE — Telephone Encounter (Signed)
Pt confirmed labs/ov per 02/22 POF, gave pt AVS and Calendar...Marland KitchenMarland KitchenCherylann Banas, gave pt barium

## 2015-10-01 NOTE — Progress Notes (Signed)
Electrophysiology Office Note Date: 10/02/2015  ID:  Michon, Kaczmarek [birth year], MRN [id number]  PCP: [name], PA-C Electrophysiologist: Kathlee Nations is a 70 y.o. female is seen today for Dr. [name].  She presents today for routine electrophysiology followup as part of research protocol.  Since last being seen in our clinic, the patient reports doing very well.  She denies recent chest pain, shortness of breath, LE edema, syncope, or pre-syncope. She has not had bleeding complications. She has a very low AF burden by symptoms. She has had some claudication symptoms while walking  Device History: STJ Leadless PPM implanted 09/14/12 for tachy-brady syndrome.    Past Medical History  Diagnosis Date  . Asthma   . Allergic rhinitis   . History of kidney stones   . H/O hiatal hernia   . GERD (gastroesophageal reflux disease)   . Pulmonary nodule   . Paroxysmal atrial fibrillation (HCC)     a. anticoagulated with Xarelto  . Atrial flutter ([place])   . Tachycardia-bradycardia syndrome ([place])     a. 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  [name] Anxiety   . Diverticulosis   . Internal hemorrhoid   . Arthritis   . Lung cancer ([place])     a. Stage IB non-small cell carcinoma, s/p R VATS, wedge resection, RU lobectomy 10/2012.  . Diabetes mellitus without complication ([place])     steroid induced from chemo  . Cholelithiases 09/26/2015   Past Surgical History  Procedure Laterality Date  . Partial hysterectomy    . Video assisted thoracoscopy (vats)/wedge resection Right 10/27/2012    Procedure: VIDEO ASSISTED THORACOSCOPY (VATS),RGHT UPPER LOBE LUNG WEDGE RESECTION;  Surgeon: [name], MD;  Location: [facility];  Service: Thoracic;  Laterality: Right;  . Lobectomy Right 10/27/2012    Procedure: LOBECTOMY;  Surgeon: [name], MD;  Location: [facility];  Service: Thoracic;  Laterality: Right;   RIGHT UPPER LOBECTOMY & Node Dissection  . Colonoscopy with  polypectomy  2015  . Lymph node biopsy Right 05/01/2014    Procedure: LYMPH NODE BIOPSY, Right Axillary;  Surgeon: [name], MD;  Location: [facility];  Service: Thoracic;  Laterality: Right;  . Permanent pacemaker insertion N/A 09/14/2012    Nanostim (SJM) leadless pacemaker (LEADLESS II STUDY PATIENT) implanted by Dr [name]    Current Outpatient Prescriptions  Medication Sig Dispense Refill  . acetaminophen (TYLENOL) 500 MG tablet Take 500 mg by mouth every 6 (six) hours as needed for moderate pain or fever.     [name] albuterol (PROVENTIL HFA;VENTOLIN HFA) 108 (90 BASE) MCG/ACT inhaler Inhale 1-2 puffs into the lungs every 6 (six) hours as needed for wheezing or shortness of breath.    [name] albuterol (PROVENTIL) (2.5 MG/3ML) 0.083% nebulizer solution Take 3 mLs (2.5 mg total) by nebulization every 2 (two) hours as needed for shortness of breath. 120 mL 0  . amiodarone (PACERONE) 100 MG tablet Take 1 tablet (100 mg total) by mouth daily. 90 tablet 3  . diltiazem (CARDIZEM CD) 240 MG 24 hr capsule Take 1 capsule (240 mg total) by mouth daily. 30 capsule 11  . fexofenadine (ALLEGRA) 180 MG tablet Take 180 mg by mouth daily.    . meclizine (ANTIVERT) 25 MG tablet Take 25 mg by mouth 3 (three) times daily as needed for dizziness or nausea.    . montelukast (SINGULAIR) 10 MG tablet Take 10 mg by mouth at bedtime.   5  . rivaroxaban (XARELTO)  20 MG TABS tablet Take 1 tablet (20 mg total) by mouth daily with supper. 30 tablet 11   No current facility-administered medications for this visit.    Allergies:   Prednisone; Septra; Penicillins; and Vioxx   Social History: Social History   Social History  . Marital Status: Single    Spouse Name: N/A  . Number of Children: N/A  . Years of Education: N/A   Occupational History  . Not on file.   Social History Main Topics  . Smoking status: Former Smoker -- 2.00 packs/day for 40 years    Types: Cigarettes    Quit date: 08/05/1995  . Smokeless  tobacco: Never Used  . Alcohol Use: No  . Drug Use: No  . Sexual Activity: Not on file   Other Topics Concern  . Not on file   Social History Narrative   Patient lives alone in [place].   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   Patient has 1 living daughter     Family History: Family History  Problem Relation Age of Onset  . Heart disease Mother   . Cancer Mother   . Diabetes Mother   . Asthma Father   . Heart disease Father   . Cancer Brother   . Diabetes Sister   . Cancer Sister   . Diabetes Brother   . Heart attack Son     Review of Systems: All other systems reviewed and are otherwise negative except as noted above.   Physical Exam: VS:  BP 120/60 mmHg  Pulse 79  Ht '5\' 2"'$  (1.575 m)  Wt 155 lb (70.308 kg)  BMI 28.34 kg/m2  SpO2 96% , BMI Body mass index is 28.34 kg/(m^2).  GEN- The patient is elderly appearing, alert and oriented x 3 today.   HEENT: normocephalic, atraumatic; sclera clear, conjunctiva pink; hearing intact; oropharynx clear; neck supple Lungs- Clear to ausculation bilaterally, decreased right sided breath sounds, normal work of breathing.  No wheezes, rales, rhonchi Heart- Regular rate and rhythm, no murmurs, rubs or gallops GI- soft, non-tender, non-distended, bowel sounds present Extremities- no clubbing, cyanosis, or edema; DP/PT/radial pulses 1+ bilaterally MS- no significant deformity or atrophy Skin- warm and dry, multiple bruises Psych- euthymic mood, full affect Neuro- strength and sensation are intact  PPMinterrogation- reviewed in detail today,  See PACEART report  EKG:  EKG is not ordered today.  Recent Labs: 10/09/2014: TSH 1.75 09/19/2015: ALT 26; BUN 11.7; Creatinine 0.8; HGB 12.0; Platelets 185; Potassium 4.3; Sodium 142   Wt Readings from Last 3 Encounters:  10/02/15 155 lb (70.308 kg)  09/26/15 154 lb 1.6 oz (69.899 kg)  08/06/15 155 lb (70.308 kg)     Assessment and Plan:  1.  Tachy/Brady Normal PPM  function See Pace Art report No changes today Pt aware of advisory on pacemaker. Discussed the recommendation from STJ is to not replace at this time. Follow per research recommendations.   2.  Paroxysmal atrial fibrillation Predominately maintaining SR per symptoms.  Amiodarone '100mg'$  daily - LFT's recently checked and slightly elevated, will follow (discussed with Dr [name] today).  TSH today.  Annual eye exams recommended Continue xarelto for [facility] of 3  Current medicines are reviewed at length with the patient today.   The patient does not have concerns regarding her medicines.  The following changes were made today:  none  Labs/ tests ordered today include:  TSH  Disposition:   FU with Dr [name] 6 months   Signed, [employer]  Lynnell Jude, NP 10/02/2015 3:11 PM  [place] [address] [address] [phone] (office) [phone] (fax)

## 2015-10-02 ENCOUNTER — Encounter: Payer: Self-pay | Admitting: Nurse Practitioner

## 2015-10-02 ENCOUNTER — Ambulatory Visit (INDEPENDENT_AMBULATORY_CARE_PROVIDER_SITE_OTHER): Payer: Medicare Other | Admitting: Nurse Practitioner

## 2015-10-02 VITALS — BP 120/60 | HR 79 | Ht 62.0 in | Wt 155.0 lb

## 2015-10-02 DIAGNOSIS — I495 Sick sinus syndrome: Secondary | ICD-10-CM | POA: Diagnosis not present

## 2015-10-02 DIAGNOSIS — I480 Paroxysmal atrial fibrillation: Secondary | ICD-10-CM

## 2015-10-02 LAB — CUP PACEART INCLINIC DEVICE CHECK
MDC IDC SESS DTM: 20170228161613
Pulse Gen Serial Number: 1095

## 2015-10-02 NOTE — Patient Instructions (Signed)
Medication Instructions:  Your physician recommends that you continue on your current medications as directed. Please refer to the Current Medication list given to you today.    If you need a refill on your cardiac medications before your next appointment, please call your pharmacy.  Labwork:  NONE ORDER TODAY    Testing/Procedures:  NONE ORDER TODAY    Follow-Up:  Your physician wants you to follow-up in:  IN  6  MONTHS WITH DR [name] You will receive a reminder letter in the mail two months in advance. If you don't receive a letter, please call our office to schedule the follow-up appointment.      Any Other Special Instructions Will Be Listed Below (If Applicable).

## 2015-10-04 ENCOUNTER — Encounter: Payer: Medicare Other | Admitting: Nurse Practitioner

## 2015-10-05 ENCOUNTER — Telehealth: Payer: Self-pay | Admitting: *Deleted

## 2015-10-05 NOTE — Telephone Encounter (Signed)
Pt called regarding recent referral to Dr,. Jenkins in [place]. Discussed with pt referral was entered on 2/22 by MD. Pt advised they need her records. Offered to transfer pt to medical records to check status of records released. Pt declined advised she will have his office call for the records. No further concerns. Message to HIM regarding pt concern

## 2015-10-09 ENCOUNTER — Telehealth: Payer: Self-pay | Admitting: Internal Medicine

## 2015-10-09 NOTE — Telephone Encounter (Signed)
Faxed pt medical records to Dr. Aviva Signs f- [phone].

## 2015-11-05 ENCOUNTER — Other Ambulatory Visit: Payer: Self-pay

## 2015-11-07 ENCOUNTER — Encounter: Payer: Self-pay | Admitting: Gastroenterology

## 2015-11-07 ENCOUNTER — Ambulatory Visit: Payer: Medicare Other | Admitting: Gastroenterology

## 2015-11-07 ENCOUNTER — Ambulatory Visit (INDEPENDENT_AMBULATORY_CARE_PROVIDER_SITE_OTHER): Payer: Medicare Other | Admitting: Gastroenterology

## 2015-11-07 VITALS — BP 137/63 | HR 77 | Temp 96.9°F | Ht 62.0 in | Wt 154.8 lb

## 2015-11-07 DIAGNOSIS — R7989 Other specified abnormal findings of blood chemistry: Secondary | ICD-10-CM

## 2015-11-07 DIAGNOSIS — R945 Abnormal results of liver function studies: Principal | ICD-10-CM

## 2015-11-07 NOTE — Patient Instructions (Signed)
I have scheduled you for a special ultrasound of your liver.  I have also ordered extensive tests to evaluate the cause of your elevated liver numbers.  Further recommendations to follow!

## 2015-11-07 NOTE — Progress Notes (Signed)
Primary Care Physician:  [name] Primary Gastroenterologist:  Dr. Oneida Alar   Chief Complaint  Patient presents with  . elevated LFT'S    HPI:   Nancy Hodges is a 70 y.o. female presenting today at the request of Dr. Arnoldo Morale for further evaluation of elevated LFTs. She was referred to Dr. Arnoldo Morale for a cholecystectomy, but has been referred to GI due to increasing bilirubin. She has a history of metastatic non-small cell lung cancer, diagnosed in March 2014, s/p right upper lobectomy with lymph node dissection but had evidence of disease recurrence in the right axilla, s/p resection on Sept 2015. Completed chemo Feb 2016. Her serum bilirubin has been slowly increasing. She is on amiodarone. CT abd/pelvis with cholelithiasis. No US abdomen completed. She has had very marginal elevation of AST up to 40, with bilirubin climbing into the 2 range over past year. Fractionated bilirubin not available.   She has very mild pain to the right of her umbilicus, intermittent. Sometimes 2-3 times a day, sometimes every few days. No association with eating or drinking. BM daily and has mild cramping at this same area. Denies nausea or vomiting. No dysphagia. No history of liver disease. Last colonoscopy/EGD at [facility] 2015: bile reflux in stomach, mild gastritis, hiatal hernia. Colonoscopy with scattered sigmoid diverticula, internal hemorrhoids, sessile polyp in ascending and mid transverse colon (tubular adenoma).   Past Medical History  Diagnosis Date  . Asthma   . Allergic rhinitis   . History of kidney stones   . H/O hiatal hernia   . GERD (gastroesophageal reflux disease)   . Pulmonary nodule   . Paroxysmal atrial fibrillation (HCC)     a. anticoagulated with Xarelto  . Atrial flutter ([place])   . Tachycardia-bradycardia syndrome ([place])     a. 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  [name] Anxiety   . Diverticulosis   . Internal hemorrhoid   . Arthritis   . Lung cancer ([place])    a. Stage IB non-small cell carcinoma, s/p R VATS, wedge resection, RU lobectomy 10/2012.  . Diabetes mellitus without complication ([place])     steroid induced from chemo  . Cholelithiases 09/26/2015    Past Surgical History  Procedure Laterality Date  . Partial hysterectomy    . Video assisted thoracoscopy (vats)/wedge resection Right 10/27/2012    Procedure: VIDEO ASSISTED THORACOSCOPY (VATS),RGHT UPPER LOBE LUNG WEDGE RESECTION;  Surgeon: [name], MD;  Location: [facility];  Service: Thoracic;  Laterality: Right;  . Lobectomy Right 10/27/2012    Procedure: LOBECTOMY;  Surgeon: [name], MD;  Location: [facility];  Service: Thoracic;  Laterality: Right;   RIGHT UPPER LOBECTOMY & Node Dissection  . Colonoscopy with polypectomy  2015  . Lymph node biopsy Right 05/01/2014    Procedure: LYMPH NODE BIOPSY, Right Axillary;  Surgeon: [name], MD;  Location: [facility];  Service: Thoracic;  Laterality: Right;  . Permanent pacemaker insertion N/A 09/14/2012    Nanostim (SJM) leadless pacemaker (LEADLESS II STUDY PATIENT) implanted by Dr [name]  . Esophagogastroduodenoscopy      [facility]: bile reflux in stomach, mild gastritis, hiatal hernia  . Colonoscopy      [facility]: cattered sigmoid diverticula, internal hemorrhoids, sessile polyp in ascending and mid transverse colon (tubular adenoma).     Current Outpatient Prescriptions  Medication Sig Dispense Refill  . acetaminophen (TYLENOL) 500 MG tablet Take 500 mg by mouth every 6 (six) hours as needed for moderate pain or fever.     [name]  albuterol (PROVENTIL HFA;VENTOLIN HFA) 108 (90 BASE) MCG/ACT inhaler Inhale 1-2 puffs into the lungs every 6 (six) hours as needed for wheezing or shortness of breath.    [name] albuterol (PROVENTIL) (2.5 MG/3ML) 0.083% nebulizer solution Take 3 mLs (2.5 mg total) by nebulization every 2 (two) hours as needed for shortness of breath. 120 mL 0  . amiodarone (PACERONE) 100 MG tablet Take 1 tablet (100 mg  total) by mouth daily. 90 tablet 3  . diltiazem (CARDIZEM CD) 240 MG 24 hr capsule Take 1 capsule (240 mg total) by mouth daily. 30 capsule 11  . fexofenadine (ALLEGRA) 180 MG tablet Take 180 mg by mouth daily.    . meclizine (ANTIVERT) 25 MG tablet Take 25 mg by mouth 3 (three) times daily as needed for dizziness or nausea.    . montelukast (SINGULAIR) 10 MG tablet Take 10 mg by mouth at bedtime.   5  . rivaroxaban (XARELTO) 20 MG TABS tablet Take 1 tablet (20 mg total) by mouth daily with supper. 30 tablet 11   No current facility-administered medications for this visit.    Allergies as of 11/07/2015 - Review Complete 11/07/2015  Allergen Reaction Noted  . Prednisone Shortness Of Breath and Other (See Comments) 06/24/2012  . Septra [sulfamethoxazole-trimethoprim] Shortness Of Breath 06/24/2012  . Penicillins Swelling and Rash 06/24/2012  . Vioxx [rofecoxib] Other (See Comments) 06/24/2012    Family History  Problem Relation Age of Onset  . Heart disease Mother   . Cancer Mother   . Diabetes Mother   . Asthma Father   . Heart disease Father   . Cancer Brother   . Diabetes Sister   . Cancer Sister   . Diabetes Brother   . Heart attack Son   . Colon cancer Neg Hx     Social History   Social History  . Marital Status: Single    Spouse Name: N/A  . Number of Children: N/A  . Years of Education: N/A   Occupational History  . Not on file.   Social History Main Topics  . Smoking status: Former Smoker -- 2.00 packs/day for 40 years    Types: Cigarettes    Quit date: 08/05/1995  . Smokeless tobacco: Never Used  . Alcohol Use: No  . Drug Use: No  . Sexual Activity: Not on file   Other Topics Concern  . Not on file   Social History Narrative   Patient lives alone in [place].   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   Patient has 1 living daughter     Review of Systems: As mentioned in HPI   Physical Exam: BP 137/63 mmHg  Pulse 77  Temp(Src) 96.9 F  (36.1 C) (Oral)  Ht '5\' 2"'$  (1.575 m)  Wt 154 lb 12.8 oz (70.217 kg)  BMI 28.31 kg/m2 General:   Alert and oriented. Pleasant and cooperative. Well-nourished and well-developed.  Head:  Normocephalic and atraumatic. Eyes:  Without icterus, sclera clear and conjunctiva pink.  Ears:  Normal auditory acuity. Nose:  No deformity, discharge,  or lesions. Mouth:  No deformity or lesions, oral mucosa pink.  Lungs:  Clear to auscultation bilaterally.  Heart:  S1, S2 present without murmurs appreciated.  Abdomen:  +BS, soft, non-tender and non-distended. No HSM noted. No guarding or rebound. No masses appreciated.  Rectal:  Deferred  Msk:  Symmetrical without gross deformities. Normal posture. Extremities:  Without  edema. Neurologic:  Alert and  oriented x4;  grossly normal neurologically. Psych:  Alert and cooperative. Normal mood and affect.    Lab Results  Component Value Date   WBC 3.9 09/19/2015   HGB 12.0 09/19/2015   HCT 34.8 09/19/2015   MCV 99.0 09/19/2015   PLT 185 09/19/2015

## 2015-11-08 LAB — HEPATIC FUNCTION PANEL
ALBUMIN: 4.2 g/dL (ref 3.6–5.1)
ALK PHOS: 123 U/L (ref 33–130)
ALT: 26 U/L (ref 6–29)
AST: 36 U/L — AB (ref 10–35)
Bilirubin, Direct: 0.4 mg/dL — ABNORMAL HIGH (ref <=0.2)
Indirect Bilirubin: 1.6 mg/dL — ABNORMAL HIGH (ref 0.2–1.2)
TOTAL PROTEIN: 7.3 g/dL (ref 6.1–8.1)
Total Bilirubin: 2 mg/dL — ABNORMAL HIGH (ref 0.2–1.2)

## 2015-11-08 LAB — IRON AND TIBC
%SAT: 34 % (ref 11–50)
IRON: 146 ug/dL (ref 45–160)
TIBC: 431 ug/dL (ref 250–450)
UIBC: 285 ug/dL (ref 125–400)

## 2015-11-08 LAB — BILIRUBIN,DIRECT & INDIRECT (FRACTIONATED)
BILIRUBIN DIRECT: 0.4 mg/dL — AB (ref <=0.2)
Indirect Bilirubin: 1.6 mg/dL — ABNORMAL HIGH (ref 0.2–1.2)

## 2015-11-08 LAB — BILIRUBIN, FRACTIONATED(TOT/DIR/INDIR)
BILIRUBIN DIRECT: 0.4 mg/dL — AB (ref <=0.2)
BILIRUBIN INDIRECT: 1.6 mg/dL — AB (ref 0.2–1.2)
Total Bilirubin: 2 mg/dL — ABNORMAL HIGH (ref 0.2–1.2)

## 2015-11-08 LAB — MITOCHONDRIAL ANTIBODIES: Mitochondrial M2 Ab, IgG: <=20 Units (ref <=20.0)

## 2015-11-08 LAB — HEPATITIS B SURFACE ANTIGEN: Hepatitis B Surface Ag: NEGATIVE

## 2015-11-08 LAB — ANA: ANA: NEGATIVE

## 2015-11-08 LAB — HEPATITIS C ANTIBODY: HCV AB: NEGATIVE

## 2015-11-08 LAB — FERRITIN: Ferritin: 28 ng/mL (ref 20–288)

## 2015-11-09 ENCOUNTER — Encounter: Payer: Self-pay | Admitting: Gastroenterology

## 2015-11-09 LAB — ANTI-SMOOTH MUSCLE ANTIBODY, IGG: Smooth Muscle Ab: <20 U (ref <20)

## 2015-11-09 NOTE — Assessment & Plan Note (Signed)
70 year old female with slowly increasing bilirubin, very marginal elevation of AST, otherwise normal LFTs, with no known history of liver disease. CT on file with cholelithiasis. Will obtain ultrasound with elastography. Need to fractionate bilirubin and will go ahead and order further labs. Her symptoms do not seem consistent with biliary disease and agree with holding on cholecystectomy as she is a poor surgical candidate. Further recommendations to follow.

## 2015-11-12 ENCOUNTER — Ambulatory Visit (HOSPITAL_COMMUNITY)
Admission: RE | Admit: 2015-11-12 | Discharge: 2015-11-12 | Disposition: A | Discharge status: Home / Self Care | Payer: Medicare Other | Source: Ambulatory Visit | Attending: Gastroenterology | Admitting: Gastroenterology

## 2015-11-12 DIAGNOSIS — R7989 Other specified abnormal findings of blood chemistry: Secondary | ICD-10-CM

## 2015-11-12 DIAGNOSIS — K760 Fatty (change of) liver, not elsewhere classified: Secondary | ICD-10-CM | POA: Diagnosis not present

## 2015-11-12 DIAGNOSIS — K8020 Calculus of gallbladder without cholecystitis without obstruction: Secondary | ICD-10-CM | POA: Diagnosis not present

## 2015-11-12 DIAGNOSIS — R945 Abnormal results of liver function studies: Secondary | ICD-10-CM

## 2015-11-12 NOTE — Progress Notes (Signed)
CC'ED TO PCP 

## 2015-11-22 ENCOUNTER — Telehealth: Payer: Self-pay | Admitting: Gastroenterology

## 2015-11-22 NOTE — Telephone Encounter (Signed)
Pt was seen on 4/5 and was checking to see if her lab results were available. Please call and if no answer leave a VM. [phone]

## 2015-11-23 ENCOUNTER — Other Ambulatory Visit: Payer: Self-pay | Admitting: Gastroenterology

## 2015-11-23 MED ORDER — PANTOPRAZOLE SODIUM 40 MG PO TBEC: 40.0000 mg | DELAYED_RELEASE_TABLET | Freq: Every day | ORAL | Status: DC

## 2015-11-23 NOTE — Progress Notes (Signed)
Quick Note:  I spoke with patient. She is complaining of burping, some "gas" discomfort. Belching. No PPI. History of gastritis. I have sent in Protonix to the pharmacy for her. ______

## 2015-11-23 NOTE — Progress Notes (Signed)
Quick Note:  I'm sorry, this needed to go to [facility]. Routing to her. ______

## 2015-11-23 NOTE — Progress Notes (Signed)
Quick Note:  Her LFTs show persistent total bilirubin marginally elevated, indirect more elevated than direct. No evidence of obstruction and transaminases are remaining pretty stable with just a marginal increase historically and most recent nearly normal (AST 36). Her extensive serologies are all negative as expected. Her elastography showed F3/F4, and I would just proceed with ultrasounds every 6 months and consider repeat elastography at 1 year. 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 I am discussing with Dr. Oneida Alar. ______

## 2015-11-23 NOTE — Telephone Encounter (Signed)
Called about labs and [place] results

## 2015-11-23 NOTE — Telephone Encounter (Signed)
I spoke with patient personally.

## 2015-11-26 NOTE — Progress Notes (Signed)
Quick Note:    Noted    ______

## 2015-11-27 ENCOUNTER — Encounter: Payer: Self-pay | Admitting: Internal Medicine

## 2015-12-04 ENCOUNTER — Ambulatory Visit (INDEPENDENT_AMBULATORY_CARE_PROVIDER_SITE_OTHER): Payer: Medicare Other | Admitting: Allergy and Immunology

## 2015-12-04 ENCOUNTER — Encounter: Payer: Self-pay | Admitting: Allergy and Immunology

## 2015-12-04 VITALS — BP 120/60 | HR 72 | Temp 98.4°F | Resp 20 | Ht 61.42 in | Wt 152.8 lb

## 2015-12-04 DIAGNOSIS — J309 Allergic rhinitis, unspecified: Secondary | ICD-10-CM | POA: Diagnosis not present

## 2015-12-04 DIAGNOSIS — R05 Cough: Secondary | ICD-10-CM

## 2015-12-04 DIAGNOSIS — H1010 Acute atopic conjunctivitis, unspecified eye: Secondary | ICD-10-CM

## 2015-12-04 DIAGNOSIS — R059 Cough, unspecified: Secondary | ICD-10-CM

## 2015-12-04 NOTE — Patient Instructions (Addendum)
Take Home Sheet  1. Avoidance: Mold   2. Antihistamine: Allegra '180mg'$  by mouth once daily for runny nose or itching.   3. Nasal Spray: Nasonex one spray(s) each nostril once daily for stuffy nose or drainage.    4. Inhalers:  Rescue: ProAir 2 puffs every 4 hours as needed for cough or wheeze.       -May use 2puffs 10-20 minutes prior to exercise.   Preventative: QVAR 54mg 2 puffs once daily (Rinse, gargle, and spit out after use).   5. Eye Drops: Pazeo one drop(s) each eye once daily for itchy eyes as needed   6. Other: Continue Singulair '10mg'$  each evening.   7. Nasal Saline wash each evening at shower time.   8. Follow up Visit: 2 months or sooner if needed.    May consider evaluation with pulmonology.  Websites that have reliable Patient information: 1. American Academy of Asthma, Allergy, & Immunology: www.aaaai.org 2. Food Allergy Network: www.foodallergy.org 3. Mothers of Asthmatics: www.aanma.org 4. [place] wDiningCalendar.de5. American College of Allergy, Asthma, & Immunology: whttps://robertson.info/or www.acaai.org  Reducing Pollen Exposure  The American Academy of Allergy, Asthma and Immunology suggests the following steps to reduce your exposure to pollen during allergy seasons.  1. Do not hang sheets or clothing out to dry; pollen may collect on these items. 2. Do not mow lawns or spend time around freshly cut grass; mowing stirs up pollen. 3. Keep windows closed at night.  Keep car windows closed while driving. 4. Minimize morning activities outdoors, a time when pollen counts are usually at their highest. 5. Stay indoors as much as possible when pollen counts or humidity is high and on windy days when pollen tends to remain in the air longer. 6. Use air conditioning when possible.  Many air conditioners have filters that trap the pollen spores. 7. Use a HEPA room air filter to remove pollen form the indoor air you  breathe.  Control of Mold Allergen  Mold and fungi can grow on a variety of surfaces provided certain temperature and moisture conditions exist.  Outdoor molds grow on plants, decaying vegetation and soil.  The major outdoor mold, Alternaria dn Cladosporium, are found in very high numbers during hot and dry conditions.  Generally, a late Summer - Fall peak is seen for common outdoor fungal spores.  Rain will temporarily lower outdoor mold spore count, but counts rise rapidly when the rainy period ends.  The most important indoor molds are Aspergillus and Penicillium.  Dark, humid and poorly ventilated basements are ideal sites for mold growth.  The next most common sites of mold growth are the bathroom and the kitchen.  Outdoor MDeere & Company1. Use air conditioning and keep windows closed 2. Avoid exposure to decaying vegetation. 3. Avoid leaf raking. 4. Avoid grain handling. 5. Consider wearing a face mask if working in moldy areas.  Indoor Mold Control 1. Maintain humidity below 50%. 2. Clean washable surfaces with 5% bleach solution. 3. Remove sources e.g. Contaminated carpets.

## 2015-12-04 NOTE — Progress Notes (Signed)
NEW PATIENT NOTE  RE: Nancy Hodges MRN: [id number] DOB: 10-May-1946 [facility] [place]. [address] [place], [place] [address] Date of Office Visit: 12/04/2015  Dear Nancy Fairy, PA:  I had the pleasure of seeing Nancy Hodges today in initial evaluation as you recall-- Subjective:  Nancy Hodges is a 70 y.o. female who presents today for Asthma; Allergy Testing; Cough; Sinusitis; Shortness of Breath; and Allergic Rhinitis   Assessment:   1. Cough, report of previous asthma diagnosis.   2. Allergic rhinoconjunctivitis, mild with component of non-allergic rhinitis.   3.      Unlikely any Prednisone allergy in patient who has recently tolerated dexamethasone. 4.      Complex medical history, including lung cancer s/p chemo. Plan:  1. Avoidance: Mold 2. Antihistamine: Allegra '180mg'$  by mouth once daily for runny nose or itching. 3. Nasal Spray: Nasonex one spray(s) each nostril once daily for stuffy nose or drainage.  4. Inhalers:  Rescue: ProAir 2 puffs every 4 hours as needed for cough or wheeze.       -May use 2puffs 10-20 minutes prior to exercise  Preventative: QVAR 70mg 2 puffs once daily (Rinse, gargle, and spit out after use). 5. Eye Drops: Pazeo one drop(s) each eye once daily for itchy eyes as needed 6. Other: Continue Singulair '10mg'$  each evening. 7. Nasal Saline wash each evening at shower time. 8. Follow up Visit: 2 months or sooner if needed.    May consider evaluation with pulmonology and will communicate with her Oncologist as well.  HPI: [name] who has a complex medical history including status post lung cancer(former smoker) presents to the office reporting a diagnosis of asthma approximately 15 years ago and recurring upper respiratory symptoms for more that 40 years.  She describes typically spring and fall as symptomatic time of year where pollen, fluctuant weather patterns, strong odors/perfumes and cooler temperatures appear to  trigger symptoms.  Allegra and Singulair seem to decrease recurring rhinorrhea, sneezing, nasal congestion, sneezing, itchy watery eyes, post nasal drip, cough and sinus pressure.  She reports wheezing on occasion with previous nocturnal and exercise induced symptoms, when she has used albuterol approximately once a week.  Recently she has discontinued Flonase because of nasal irritation.  She denies headache, sorethroat, difficulty in breathing or chest tightness.  Many years ago, she reports concern that an episode of shortness of breath and panic was related to Prednisone (though, she has recently taken dexamethasone through her Oncologist without issue).  Denies Urgent care visits or antibiotic courses.  Medical History: Past Medical History  Diagnosis Date  . Asthma   . Allergic rhinitis   . History of kidney stones   . H/O hiatal hernia   . GERD (gastroesophageal reflux disease)   . Pulmonary nodule   . Paroxysmal atrial fibrillation (HCC)     a. anticoagulated with Xarelto  . Atrial flutter ([facility]   . 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  .Marland KitchenAnxiety   . Diverticulosis   . Internal hemorrhoid   . Arthritis   . Lung cancer ([facility]     a. Stage IB non-small cell carcinoma, s/p R VATS, wedge resection, RU lobectomy 10/2012.  . Diabetes mellitus without complication ([facility]     steroid induced from chemo  . Cholelithiases 09/26/2015   Surgical History: Past Surgical History  Procedure Laterality Date  . Partial hysterectomy    . Video assisted thoracoscopy (vats)/wedge resection  Right 10/27/2012    Procedure: VIDEO ASSISTED THORACOSCOPY (VATS),RGHT UPPER LOBE LUNG WEDGE RESECTION;  Surgeon: [name], MD;  Location: [facility];  Service: Thoracic;  Laterality: Right;  . Lobectomy Right 10/27/2012    Procedure: LOBECTOMY;  Surgeon: [name], MD;  Location: [facility];  Service: Thoracic;  Laterality: Right;   RIGHT UPPER LOBECTOMY  & Node Dissection  . Colonoscopy with polypectomy  2015  . Lymph node biopsy Right 05/01/2014    Procedure: LYMPH NODE BIOPSY, Right Axillary;  Surgeon: [name], MD;  Location: [facility];  Service: Thoracic;  Laterality: Right;  . Permanent pacemaker insertion N/A 09/14/2012    Nanostim (SJM) leadless pacemaker (LEADLESS II STUDY PATIENT) implanted by Dr [name]  . Esophagogastroduodenoscopy      [place]: bile reflux in stomach, mild gastritis, hiatal hernia  . Colonoscopy      [place]: cattered sigmoid diverticula, internal hemorrhoids, sessile polyp in ascending and mid transverse colon (tubular adenoma).    Family History: Family History  Problem Relation Age of Onset  . Heart disease Mother   . Cancer Mother   . Diabetes Mother   . Asthma Father   . Heart disease Father   . Cancer Brother   . Diabetes Sister   . Cancer Sister   . Diabetes Brother   . Heart attack Son   . Colon cancer Neg Hx    Social History: Social History  . Marital Status: Single    Spouse Name: N/A  . Number of Children: N/A  . Years of Education: N/A   Social History Main Topics  . Smoking status: Former Smoker -- 2.00 packs/day for 40 years    Types: Cigarettes    Quit date: 08/05/1995  . Smokeless tobacco: Never Used  . Alcohol Use: No  . Drug Use: No  . Sexual Activity: Not on file   Social History Narrative   Patient lives alone in [place].   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   Patient has 1 living daughter    [name] has a current medication list which includes the following prescription(s): acetaminophen, albuterol, amiodarone, diltiazem, fexofenadine, fluticasone, ipratropium-albuterol, meclizine, montelukast, rivaroxaban, and pantoprazole.   Drug Allergies: Allergies  Allergen Reactions  . Prednisone Patient unsure of any reaction.  Sarina Ill [Sulfamethoxazole-Trimethoprim] Shortness Of Breath  . Penicillins Swelling and Rash      . Vioxx [Rofecoxib] Other  (See Comments)    REACTION:  Stomach cramps   Environmental History: Shatana lives in a 72 year old trailer for 22 years with carpet/tile floors, with central heat and air; stuffed mattress, non-feather pillow/comforter with bedroom humidifier and cat, without asmokers.   Review of Systems  Constitutional: Negative for fever, weight loss and malaise/fatigue.       Childhood varicella disease.  HENT: Positive for congestion. Negative for ear pain, hearing loss, nosebleeds and sore throat.   Eyes: Negative for discharge and redness.       Corrective eye glass lenses.  Respiratory: Positive for cough. Negative for hemoptysis, sputum production and shortness of breath.        Denies history of pneumonia.  Gastrointestinal: Negative for heartburn, nausea, vomiting, abdominal pain, diarrhea and constipation.  Genitourinary: Negative.   Musculoskeletal: Negative for myalgias and joint pain.  Skin: Negative.  Negative for itching and rash.  Neurological: Negative.  Negative for dizziness, seizures, weakness and headaches (rare associated witj nasal symptoms.).  Endo/Heme/Allergies: Positive for environmental allergies.  Denies sensitivity to aspirin, NSAIDs, stinging insects, foods, latex, jewelry and cosmetics. History of blood transfusion.  Immunological: No chronic or recurring infections. Objective:   Filed Vitals:   12/04/15 1424  BP: 120/60  Pulse: 72  Temp: 98.4 F (36.9 C)  Resp: 20   SpO2 Readings from Last 1 Encounters:  12/04/15 96%   Physical Exam  Constitutional: She is well-developed, well-nourished, and in no distress.  HENT:  Head: Atraumatic.  Right Ear: Tympanic membrane and ear canal normal.  Left Ear: Tympanic membrane and ear canal normal.  Nose: Mucosal edema present. No rhinorrhea. No epistaxis.  Mouth/Throat: Oropharynx is clear and moist and mucous membranes are normal. No oropharyngeal exudate, posterior oropharyngeal edema or posterior oropharyngeal  erythema.  Eyes: Conjunctivae are normal.  Neck: Neck supple.  Cardiovascular: Normal rate, S1 normal and S2 normal.   No murmur heard. Pulmonary/Chest: Effort normal. She has no wheezes. She has no rhonchi. She has no rales.  Abdominal: Soft. Normal appearance and bowel sounds are normal.  Musculoskeletal: She exhibits no edema.  Lymphadenopathy:    She has no cervical adenopathy.  Neurological: She is alert.  Skin: Skin is warm and intact. No rash noted. No cyanosis. Nails show no clubbing.   Diagnostics: Spirometry:  FVC 1.89--88%, FEV1 1.30--74%; Postbronchodilator improvement FVC 2.18-102%, FEV1 1.55--88%.    Skin testing: Mild reactivity to selected mold only.    Roselyn M. Ishmael Holter, MD   cc: Nancy Fairy, PA-C

## 2015-12-05 ENCOUNTER — Telehealth: Payer: Self-pay | Admitting: Allergy and Immunology

## 2015-12-05 MED ORDER — MOMETASONE FUROATE 50 MCG/ACT NA SUSP: 1.0000 | Freq: Every day | NASAL | Status: DC

## 2015-12-05 MED ORDER — OLOPATADINE HCL 0.7 % OP SOLN: 1.0000 [drp] | OPHTHALMIC | Status: DC

## 2015-12-05 NOTE — Telephone Encounter (Signed)
Left message for patient to office. Per Dr. Ishmael Holter, patient PCP is out of the office and she wants patient to hold Qvar until we can talk with Dr. Inda Merlin and her PCP to confirm that she can use a steroid inhaler. We will call her back.

## 2015-12-05 NOTE — Telephone Encounter (Signed)
Informed patient waiting for return call from her PCP and need to speak with Dr. Inda Merlin about Prednisone use. Patient states she is willing to try the Qvar.

## 2015-12-05 NOTE — Telephone Encounter (Signed)
She called asking why she wasn't called this morning at 10:00 as promised yesterday at her [facility] appointment. She wants to know if you have spoken with the doctors about calling Qvar in for her. She is also requesting something for her itchy eyes. She would like a call back please. She uses the [facility] on [address].

## 2015-12-06 ENCOUNTER — Other Ambulatory Visit: Payer: Self-pay | Admitting: Allergy and Immunology

## 2015-12-06 MED ORDER — BECLOMETHASONE DIPROPIONATE 40 MCG/ACT IN AERS
2.0000 | INHALATION_SPRAY | Freq: Every day | RESPIRATORY_TRACT | Status: DC
Start: 2015-12-06 — End: 2016-04-09

## 2015-12-06 NOTE — Telephone Encounter (Signed)
Informed patient that Dr. Ishmael Holter spoke with primary care and she can now start the Qvar. Patient also states that he insurance will not cover the nasal spray. Pharmacy will fax PA

## 2015-12-07 ENCOUNTER — Telehealth: Payer: Self-pay | Admitting: Allergy and Immunology

## 2015-12-07 NOTE — Telephone Encounter (Signed)
Please advise 

## 2015-12-07 NOTE — Telephone Encounter (Signed)
Pt was calling to talk with heather about her meds not been covered by her ins. She said that she has flonase but the qvar is $225.00.

## 2015-12-11 ENCOUNTER — Other Ambulatory Visit: Payer: Self-pay

## 2015-12-12 NOTE — Telephone Encounter (Signed)
Spoke with patient reviewed picking up sample of QVAR and Rhinocort from [facility] on Tuesday 5/17 and assess if helpful. Before trying to complete further prior authorization or changing medications.  And continue other medications for now and saline nasal wash daily as Flonase seems irritating to nasal passages.  Patient will call back with any further questions.  Please have samples available for her Tuesday pick up.

## 2015-12-12 NOTE — Telephone Encounter (Signed)
See previous TC.

## 2015-12-13 NOTE — Telephone Encounter (Signed)
Sample in box to go to [facility].

## 2015-12-25 ENCOUNTER — Encounter (HOSPITAL_COMMUNITY): Payer: Self-pay

## 2015-12-25 ENCOUNTER — Other Ambulatory Visit (HOSPITAL_BASED_OUTPATIENT_CLINIC_OR_DEPARTMENT_OTHER): Payer: Medicare Other

## 2015-12-25 ENCOUNTER — Ambulatory Visit (HOSPITAL_COMMUNITY)
Admission: RE | Admit: 2015-12-25 | Discharge: 2015-12-25 | Disposition: A | Discharge status: Home / Self Care | Payer: Medicare Other | Source: Ambulatory Visit | Attending: Internal Medicine | Admitting: Internal Medicine

## 2015-12-25 DIAGNOSIS — K7460 Unspecified cirrhosis of liver: Secondary | ICD-10-CM | POA: Insufficient documentation

## 2015-12-25 DIAGNOSIS — K766 Portal hypertension: Secondary | ICD-10-CM | POA: Insufficient documentation

## 2015-12-25 DIAGNOSIS — C3491 Malignant neoplasm of unspecified part of right bronchus or lung: Secondary | ICD-10-CM | POA: Diagnosis not present

## 2015-12-25 DIAGNOSIS — Z902 Acquired absence of lung [part of]: Secondary | ICD-10-CM | POA: Insufficient documentation

## 2015-12-25 DIAGNOSIS — D735 Infarction of spleen: Secondary | ICD-10-CM | POA: Insufficient documentation

## 2015-12-25 DIAGNOSIS — K8020 Calculus of gallbladder without cholecystitis without obstruction: Secondary | ICD-10-CM | POA: Insufficient documentation

## 2015-12-25 DIAGNOSIS — R590 Localized enlarged lymph nodes: Secondary | ICD-10-CM | POA: Diagnosis not present

## 2015-12-25 DIAGNOSIS — I7090 Unspecified atherosclerosis: Secondary | ICD-10-CM | POA: Insufficient documentation

## 2015-12-25 DIAGNOSIS — R918 Other nonspecific abnormal finding of lung field: Secondary | ICD-10-CM | POA: Diagnosis not present

## 2015-12-25 DIAGNOSIS — K8010 Calculus of gallbladder with chronic cholecystitis without obstruction: Secondary | ICD-10-CM | POA: Diagnosis present

## 2015-12-25 DIAGNOSIS — I2510 Atherosclerotic heart disease of native coronary artery without angina pectoris: Secondary | ICD-10-CM | POA: Insufficient documentation

## 2015-12-25 DIAGNOSIS — R161 Splenomegaly, not elsewhere classified: Secondary | ICD-10-CM | POA: Insufficient documentation

## 2015-12-25 DIAGNOSIS — Z90710 Acquired absence of both cervix and uterus: Secondary | ICD-10-CM | POA: Insufficient documentation

## 2015-12-25 DIAGNOSIS — C3411 Malignant neoplasm of upper lobe, right bronchus or lung: Secondary | ICD-10-CM

## 2015-12-25 LAB — COMPREHENSIVE METABOLIC PANEL
ALT: 25 U/L (ref 0–55)
AST: 35 U/L — AB (ref 5–34)
Albumin: 3.7 g/dL (ref 3.5–5.0)
Alkaline Phosphatase: 118 U/L (ref 40–150)
Anion Gap: 8 mEq/L (ref 3–11)
BUN: 11.1 mg/dL (ref 7.0–26.0)
CHLORIDE: 106 meq/L (ref 98–109)
CO2: 25 meq/L (ref 22–29)
CREATININE: 0.8 mg/dL (ref 0.6–1.1)
Calcium: 9.4 mg/dL (ref 8.4–10.4)
EGFR: 74 mL/min/{1.73_m2} — ABNORMAL LOW (ref >90)
GLUCOSE: 120 mg/dL (ref 70–140)
POTASSIUM: 4.7 meq/L (ref 3.5–5.1)
SODIUM: 139 meq/L (ref 136–145)
Total Bilirubin: 1.69 mg/dL — ABNORMAL HIGH (ref 0.20–1.20)
Total Protein: 7.3 g/dL (ref 6.4–8.3)

## 2015-12-25 LAB — CBC WITH DIFFERENTIAL/PLATELET
BASO%: 0.6 % (ref 0.0–2.0)
Basophils Absolute: 0 10*3/uL (ref 0.0–0.1)
EOS%: 2.5 % (ref 0.0–7.0)
Eosinophils Absolute: 0.1 10*3/uL (ref 0.0–0.5)
HEMATOCRIT: 36.9 % (ref 34.8–46.6)
HGB: 12.5 g/dL (ref 11.6–15.9)
LYMPH#: 1.2 10*3/uL (ref 0.9–3.3)
LYMPH%: 24.8 % (ref 14.0–49.7)
MCH: 33.4 pg (ref 25.1–34.0)
MCHC: 33.9 g/dL (ref 31.5–36.0)
MCV: 98.7 fL (ref 79.5–101.0)
MONO#: 0.3 10*3/uL (ref 0.1–0.9)
MONO%: 7.2 % (ref 0.0–14.0)
NEUT#: 3.1 10*3/uL (ref 1.5–6.5)
NEUT%: 64.9 % (ref 38.4–76.8)
Platelets: 168 10*3/uL (ref 145–400)
RBC: 3.74 10*6/uL (ref 3.70–5.45)
RDW: 14.4 % (ref 11.2–14.5)
WBC: 4.7 10*3/uL (ref 3.9–10.3)

## 2015-12-25 MED ORDER — IOPAMIDOL (ISOVUE-300) INJECTION 61%
100.0000 mL | Freq: Once | INTRAVENOUS | Status: AC | PRN
  Administered 2015-12-25: 100 mL via INTRAVENOUS

## 2016-01-01 ENCOUNTER — Telehealth: Payer: Self-pay | Admitting: Internal Medicine

## 2016-01-01 ENCOUNTER — Encounter: Payer: Self-pay | Admitting: Internal Medicine

## 2016-01-01 ENCOUNTER — Ambulatory Visit (HOSPITAL_BASED_OUTPATIENT_CLINIC_OR_DEPARTMENT_OTHER): Payer: Medicare Other | Admitting: Internal Medicine

## 2016-01-01 VITALS — BP 137/70 | HR 64 | Temp 97.9°F | Resp 18 | Ht 61.42 in | Wt 154.1 lb

## 2016-01-01 DIAGNOSIS — C3491 Malignant neoplasm of unspecified part of right bronchus or lung: Secondary | ICD-10-CM

## 2016-01-01 DIAGNOSIS — C3411 Malignant neoplasm of upper lobe, right bronchus or lung: Secondary | ICD-10-CM

## 2016-01-01 NOTE — Telephone Encounter (Signed)
Gave and printed appt sched and avs for pt for Aug 2017...gv barium

## 2016-01-01 NOTE — Progress Notes (Signed)
[place] [phone]   [phone]  OFFICE PROGRESS NOTE  [name], PA-C [address] [place] [address]  DIAGNOSIS: Metastatic non-small cell lung cancer, adenocarcinoma. This was initially diagnosed as stage IB (T2a, N0, M0) In March of 2014, status post right upper lobectomy with lymph node dissection but the patient has evidence for disease recurrence in the right axilla status post resection.  PRIOR THERAPY:  1) Status post right axillary lymph node biopsy on 05/01/2014 under the care of Dr. Roxan Hockey. 2) Systemic chemotherapy with carboplatin for AUC of 5 and Alimta 500 mg/M2 every 3 weeks. First cycle 05/31/2014. Status post 6 cycles. Last dose was given 09/22/2014.   CURRENT THERAPY: Observation.  INTERVAL HISTORY: Nancy Hodges 70 y.o. female returns to the clinic today for followup visit. She is feeling fine today with no specific complaints. She was recently evaluated by gastroenterology for intermittent abdominal pain and she was found to have cholelithiasis. She was seen by surgery and recommendation was to continue on observation. She denied having any significant chest pain, shortness of breath, cough or hemoptysis. She has no weight loss or night sweats. She denied having any significant nausea or vomiting, no fever or chills. She had repeat CT scan of the chest, abdomen and pelvis performed recently and she is here for evaluation and discussion of her scan results.  MEDICAL HISTORY: Past Medical History  Diagnosis Date  . Asthma   . Allergic rhinitis   . History of kidney stones   . H/O hiatal hernia   . GERD (gastroesophageal reflux disease)   . Pulmonary nodule   . Paroxysmal atrial fibrillation (HCC)     a. anticoagulated with Xarelto  . Atrial flutter ([facility])   . 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  [name] Anxiety   . Diverticulosis   . Internal  hemorrhoid   . Arthritis   . Lung cancer ([facility])     a. Stage IB non-small cell carcinoma, s/p R VATS, wedge resection, RU lobectomy 10/2012.  . Diabetes mellitus without complication ([facility])     steroid induced from chemo  . Cholelithiases 09/26/2015    ALLERGIES:  is allergic to prednisone; septra; penicillins; and vioxx.  MEDICATIONS:  Current Outpatient Prescriptions  Medication Sig Dispense Refill  . acetaminophen (TYLENOL) 500 MG tablet Take 500 mg by mouth every 6 (six) hours as needed for moderate pain or fever.     [name] albuterol (PROVENTIL HFA;VENTOLIN HFA) 108 (90 BASE) MCG/ACT inhaler Inhale 1-2 puffs into the lungs every 6 (six) hours as needed for wheezing or shortness of breath.    [name] albuterol (PROVENTIL) (2.5 MG/3ML) 0.083% nebulizer solution Take 3 mLs (2.5 mg total) by nebulization every 2 (two) hours as needed for shortness of breath. 120 mL 0  . amiodarone (PACERONE) 100 MG tablet Take 1 tablet (100 mg total) by mouth daily. 90 tablet 3  . beclomethasone (QVAR) 40 MCG/ACT inhaler Inhale 2 puffs into the lungs daily. 1 Inhaler 3  . diltiazem (CARDIZEM CD) 240 MG 24 hr capsule Take 1 capsule (240 mg total) by mouth daily. 30 capsule 11  . fexofenadine (ALLEGRA) 180 MG tablet Take 180 mg by mouth daily.    . fluticasone (FLONASE) 50 MCG/ACT nasal spray Place 2 sprays into both nostrils daily as needed for allergies or rhinitis.    [name] ipratropium-albuterol (DUONEB) 0.5-2.5 (3) MG/3ML SOLN Take 3 mLs by  nebulization every 6 (six) hours as needed.    . meclizine (ANTIVERT) 25 MG tablet Take 25 mg by mouth 3 (three) times daily as needed for dizziness or nausea.    . mometasone (NASONEX) 50 MCG/ACT nasal spray Place 1 spray into the nose daily. Two sprays each in each nostril 17 g 5  . montelukast (SINGULAIR) 10 MG tablet Take 10 mg by mouth at bedtime.   5  . Olopatadine HCl (PAZEO) 0.7 % SOLN Place 1 drop into both eyes 1 day or 1 dose. 1 Bottle 5  . pantoprazole (PROTONIX) 40 MG  tablet Take 1 tablet (40 mg total) by mouth daily. 30 minutes before breakfast 90 tablet 3  . rivaroxaban (XARELTO) 20 MG TABS tablet Take 1 tablet (20 mg total) by mouth daily with supper. 30 tablet 11   No current facility-administered medications for this visit.    SURGICAL HISTORY:  Past Surgical History  Procedure Laterality Date  . Partial hysterectomy    . Video assisted thoracoscopy (vats)/wedge resection Right 10/27/2012    Procedure: VIDEO ASSISTED THORACOSCOPY (VATS),RGHT UPPER LOBE LUNG WEDGE RESECTION;  Surgeon: [name], MD;  Location: [facility];  Service: Thoracic;  Laterality: Right;  . Lobectomy Right 10/27/2012    Procedure: LOBECTOMY;  Surgeon: [name], MD;  Location: [facility];  Service: Thoracic;  Laterality: Right;   RIGHT UPPER LOBECTOMY & Node Dissection  . Colonoscopy with polypectomy  2015  . Lymph node biopsy Right 05/01/2014    Procedure: LYMPH NODE BIOPSY, Right Axillary;  Surgeon: [name], MD;  Location: [facility];  Service: Thoracic;  Laterality: Right;  . Permanent pacemaker insertion N/A 09/14/2012    Nanostim (SJM) leadless pacemaker (LEADLESS II STUDY PATIENT) implanted by Dr [name]  . Esophagogastroduodenoscopy      [facility]: bile reflux in stomach, mild gastritis, hiatal hernia  . Colonoscopy      [facility]: cattered sigmoid diverticula, internal hemorrhoids, sessile polyp in ascending and mid transverse colon (tubular adenoma).     REVIEW OF SYSTEMS:  A comprehensive review of systems was negative.   PHYSICAL EXAMINATION: General appearance: alert, cooperative and no distress Head: Normocephalic, without obvious abnormality, atraumatic Neck: no adenopathy, no JVD, supple, symmetrical, trachea midline and thyroid not enlarged, symmetric, no tenderness/mass/nodules Lymph nodes: Cervical, supraclavicular, and axillary nodes normal. Resp: clear to auscultation bilaterally Back: symmetric, no curvature. ROM normal. No CVA  tenderness. Cardio: regular rate and rhythm, S1, S2 normal, no murmur, click, rub or gallop GI: soft, non-tender; bowel sounds normal; no masses,  no organomegaly Extremities: extremities normal, atraumatic, no cyanosis or edema Neurologic: Alert and oriented X 3, normal strength and tone. Normal symmetric reflexes. Normal coordination and gait  ECOG PERFORMANCE STATUS: 1 - Symptomatic but completely ambulatory  Blood pressure 137/70, pulse 64, temperature 97.9 F (36.6 C), temperature source Oral, resp. rate 18, height 5' 1.42" (1.56 m), weight 154 lb 1.6 oz (69.899 kg), SpO2 98 %.  LABORATORY DATA: Lab Results  Component Value Date   WBC 4.7 12/25/2015   HGB 12.5 12/25/2015   HCT 36.9 12/25/2015   MCV 98.7 12/25/2015   PLT 168 12/25/2015      Chemistry      Component Value Date/Time   NA 139 12/25/2015 0930   NA 141 08/06/2015 1332   K 4.7 12/25/2015 0930   K 4.3 08/06/2015 1332   CL 106 08/06/2015 1332   CO2 25 12/25/2015 0930   CO2 29 08/06/2015 1332   BUN  11.1 12/25/2015 0930   BUN 17 08/06/2015 1332   CREATININE 0.8 12/25/2015 0930   CREATININE 0.66 08/06/2015 1332      Component Value Date/Time   CALCIUM 9.4 12/25/2015 0930   CALCIUM 9.7 08/06/2015 1332   ALKPHOS 118 12/25/2015 0930   ALKPHOS 123 11/07/2015 1630   AST 35* 12/25/2015 0930   AST 36* 11/07/2015 1630   ALT 25 12/25/2015 0930   ALT 26 11/07/2015 1630   BILITOT 1.69* 12/25/2015 0930   BILITOT 2.0* 11/07/2015 1630   BILITOT 2.0* 11/07/2015 1630       RADIOGRAPHIC STUDIES: Ct Chest W Contrast  12/25/2015  CLINICAL DATA:  Right-sided non-small-cell lung cancer diagnosed in 2013 2015. Chemotherapy completed in 2016. Stage IV primary. History of cholelithiasis. Diabetes. Elevated liver function tests. EXAM: CT CHEST, ABDOMEN, AND PELVIS WITH CONTRAST TECHNIQUE: Multidetector CT imaging of the chest, abdomen and pelvis was performed following the standard protocol during bolus administration of  intravenous contrast. CONTRAST:  182m ISOVUE-300 IOPAMIDOL (ISOVUE-300) INJECTION 61% COMPARISON:  Chest CT 09/19/2015. Most recent abdominal pelvic CT of 06/13/2015. FINDINGS: CT CHEST FINDINGS Mediastinum/Lymph Nodes: No supraclavicular adenopathy. Aortic and branch vessel atherosclerosis. Normal heart size, without pericardial effusion. Multivessel coronary artery atherosclerosis. No central pulmonary embolism, on this non-dedicated study. No mediastinal or hilar adenopathy. Tiny hiatal hernia. Right larger than left internal mammary nodes. Example 8 mm right-sided node on image 21/ series 2, similar. Lungs/Pleura: No pleural fluid. Status post right upper lobectomy. Narrowing of the right middle lobe bronchus is smooth and similar. Subpleural 4 mm soft tissue density laterally within the right middle lobe is slightly increased in conspicuity today, favored to be due to thinner slice thickness. A right lower lobe pulmonary nodule measures 5 mm on image 111/series 4. Increased from 2 mm on image 43/series 5 of the prior. Left lower lobe pulmonary nodule measures 5 mm on image 83/series 2. Significantly enlarged from 1-2 mm on the prior exam. More cephalad left lower lobe pulmonary nodule measures 5 mm on image 64/series 4 versus 3 mm on the prior. Musculoskeletal: No acute osseous abnormality. CT ABDOMEN PELVIS FINDINGS Hepatobiliary: Mild hepatic steatosis. Mild cirrhosis, as evidenced by caudate and lateral segment left liver lobe enlargement. Mild medial segment left liver lobe atrophy. No focal liver lesion. Cholelithiasis. Pancreas: Normal, without mass or ductal dilatation. Spleen: Borderline to mild splenomegaly, including maximally 15.1 cm greatest transverse dimension. Hypoattenuation within is likely related to remote infarct, including image 52/series 2. Adrenals/Urinary Tract: Normal adrenal glands. Mild renal cortical scarring bilaterally. No hydronephrosis. Normal urinary bladder. Stomach/Bowel:  Normal remainder of the stomach. Normal colon, appendix, and terminal ileum. Vascular/Lymphatic: Aortic and branch vessel atherosclerosis. Recannulized paraumbilical vein. Prominent aortocaval node is not pathologic by size criterion, unchanged 8 mm. Portal caval adenopathy is likely reactive, mild. Example 1.4 cm on image 59/series 2. Reproductive: Hysterectomy.  No adnexal mass. Other: No significant free fluid. No evidence of omental or peritoneal disease. Musculoskeletal: No acute osseous abnormality. IMPRESSION: 1. Status post right upper lobectomy. Enlarging small pulmonary nodules, suspicious for pulmonary metastasis. 2. Similar nonspecific internal mammary adenopathy, mild. No progressive adenopathy to suggest nodal metastasis within the chest. 3. Mild cirrhosis with portal venous hypertension. 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 4. Cholelithiasis. 5.  Atherosclerosis, including within the coronary arteries. Electronically Signed   By: KAbigail MiyamotoM.D.   On: 12/25/2015 12:25   Ct Abdomen Pelvis W Contrast  12/25/2015  CLINICAL DATA:  Right-sided non-small-cell lung cancer  diagnosed in 2013 2015. Chemotherapy completed in 2016. Stage IV primary. History of cholelithiasis. Diabetes. Elevated liver function tests. EXAM: CT CHEST, ABDOMEN, AND PELVIS WITH CONTRAST TECHNIQUE: Multidetector CT imaging of the chest, abdomen and pelvis was performed following the standard protocol during bolus administration of intravenous contrast. CONTRAST:  162m ISOVUE-300 IOPAMIDOL (ISOVUE-300) INJECTION 61% COMPARISON:  Chest CT 09/19/2015. Most recent abdominal pelvic CT of 06/13/2015. FINDINGS: CT CHEST FINDINGS Mediastinum/Lymph Nodes: No supraclavicular adenopathy. Aortic and branch vessel atherosclerosis. Normal heart size, without pericardial effusion. Multivessel coronary artery atherosclerosis. No central pulmonary embolism, on this non-dedicated study. No mediastinal or hilar  adenopathy. Tiny hiatal hernia. Right larger than left internal mammary nodes. Example 8 mm right-sided node on image 21/ series 2, similar. Lungs/Pleura: No pleural fluid. Status post right upper lobectomy. Narrowing of the right middle lobe bronchus is smooth and similar. Subpleural 4 mm soft tissue density laterally within the right middle lobe is slightly increased in conspicuity today, favored to be due to thinner slice thickness. A right lower lobe pulmonary nodule measures 5 mm on image 111/series 4. Increased from 2 mm on image 43/series 5 of the prior. Left lower lobe pulmonary nodule measures 5 mm on image 83/series 2. Significantly enlarged from 1-2 mm on the prior exam. More cephalad left lower lobe pulmonary nodule measures 5 mm on image 64/series 4 versus 3 mm on the prior. Musculoskeletal: No acute osseous abnormality. CT ABDOMEN PELVIS FINDINGS Hepatobiliary: Mild hepatic steatosis. Mild cirrhosis, as evidenced by caudate and lateral segment left liver lobe enlargement. Mild medial segment left liver lobe atrophy. No focal liver lesion. Cholelithiasis. Pancreas: Normal, without mass or ductal dilatation. Spleen: Borderline to mild splenomegaly, including maximally 15.1 cm greatest transverse dimension. Hypoattenuation within is likely related to remote infarct, including image 52/series 2. Adrenals/Urinary Tract: Normal adrenal glands. Mild renal cortical scarring bilaterally. No hydronephrosis. Normal urinary bladder. Stomach/Bowel: Normal remainder of the stomach. Normal colon, appendix, and terminal ileum. Vascular/Lymphatic: Aortic and branch vessel atherosclerosis. Recannulized paraumbilical vein. Prominent aortocaval node is not pathologic by size criterion, unchanged 8 mm. Portal caval adenopathy is likely reactive, mild. Example 1.4 cm on image 59/series 2. Reproductive: Hysterectomy.  No adnexal mass. Other: No significant free fluid. No evidence of omental or peritoneal disease.  Musculoskeletal: No acute osseous abnormality. IMPRESSION: 1. Status post right upper lobectomy. Enlarging small pulmonary nodules, suspicious for pulmonary metastasis. 2. Similar nonspecific internal mammary adenopathy, mild. No progressive adenopathy to suggest nodal metastasis within the chest. 3. Mild cirrhosis with portal venous hypertension. 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 4. Cholelithiasis. 5.  Atherosclerosis, including within the coronary arteries. Electronically Signed   By: KAbigail MiyamotoM.D.   On: 12/25/2015 12:25   ASSESSMENT AND PLAN: This is a very pleasant 70years old white female recently diagnosed with metastatic non-small cell lung cancer, adenocarcinoma with negative EGFR mutation and negative ALK gene translocation. This was initially diagnosed as stage IB status post right upper lobectomy but the patient had evidence for disease recurrence in the right axilla status post resection. She is also status post 6 cycles of systemic chemotherapy with carboplatin and Alimta. The patient tolerated her treatment fairly well with no significant adverse effects. The recent CT scan of the chest, abdomen and pelvis showed enlargement of small pulmonary nodules suspicious for pulmonary metastasis.  I discussed the scan results and showed the images to the patient today. The pulmonary nodules are too small to biopsy or consideration of treatment at this  point. I discussed with the patient continuous observation with repeat CT scan of the chest, abdomen and pelvis in 3 months for reevaluation of her disease. She was also advised to call me immediately if she develop any jaundice or had any other significant complaints. The patient voices understanding of current disease status and treatment options and is in agreement with the current care plan.  All questions were answered. The patient knows to call the clinic with any problems, questions or concerns. We can  certainly see the patient much sooner if necessary.  Disclaimer: This note was dictated with voice recognition software. Similar sounding words can inadvertently be transcribed and may not be corrected upon review.

## 2016-01-03 NOTE — Progress Notes (Signed)
Quick Note:  LMOM to call. ______ 

## 2016-01-03 NOTE — Progress Notes (Addendum)
REVIEWED. AGREE. HFP C/W GILBERT'S SYNDROME. MILD ELEVATION IN AST NOT CLINICALLY SIGNIFICANT. NO ADDITIONAL RECOMMENDATIONS.

## 2016-01-03 NOTE — Progress Notes (Signed)
Quick Note:  Gilbert's syndrome. See addendum to office note by Dr. Oneida Alar. Let's have her return to see me in a few weeks to follow-up on PPI. ______

## 2016-01-04 NOTE — Progress Notes (Signed)
Quick Note:  PT is aware. OV with Laban Emperor, NP on 01/09/2016 at 3:00 Pm. ______

## 2016-01-09 ENCOUNTER — Telehealth: Payer: Self-pay

## 2016-01-09 ENCOUNTER — Ambulatory Visit (INDEPENDENT_AMBULATORY_CARE_PROVIDER_SITE_OTHER): Payer: Medicare Other | Admitting: Gastroenterology

## 2016-01-09 ENCOUNTER — Encounter: Payer: Self-pay | Admitting: Gastroenterology

## 2016-01-09 VITALS — BP 118/59 | HR 68 | Temp 97.6°F | Ht 62.0 in | Wt 153.6 lb

## 2016-01-09 DIAGNOSIS — K7460 Unspecified cirrhosis of liver: Secondary | ICD-10-CM | POA: Diagnosis not present

## 2016-01-09 NOTE — Telephone Encounter (Signed)
Dr. [name],   This patient was seen in our office today by Laban Emperor, NP and she would like to schedule the patient for an EGD in the very near future with Dr. Oneida Alar.  Please advise if pt may hold the Xarelto x 48 hours prior to procedure.  Thanks so much!  Libero Puthoff

## 2016-01-09 NOTE — Progress Notes (Signed)
Referring Provider: [name] Primary Care Physician:  [name], PA-C Primary GI: Dr. Oneida Alar   Chief Complaint  Patient presents with  . Follow-up    HPI:   Nancy Hodges is a 70 y.o. female presenting today with a history of elevated LFTs secondary to likely  Gilbert's. 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  She has been thoroughly evaluated. Elastography F3/F4. Due for [place] in Nov 2017. Prescribed Protonix at visit in April due to dyspepsia. Never took it. Symptoms improved after she stopped eating sugar free popsicles.   Last colonoscopy/EGD at [facility] 2015: bile reflux in stomach, mild gastritis, hiatal hernia. Colonoscopy with scattered sigmoid diverticula, internal hemorrhoids, sessile polyp in ascending and mid transverse colon (tubular adenoma).   Followed by Dr. Julien Nordmann with history of metastatic non-small cell lung cancer, s/p cheom with carboplatin and Alimta. Recent CT scan of chest/abd/pelvis with enlargement of small pulmonary nodules that are suspicious, with repeat CT in 3 months ordered per oncology.   Mild, intermittent pain to right of umbilicus that is sporadic. Does not appear to be biliary in nature. Saw Dr. Arnoldo Morale for consideration of elective cholecystectomy but was referred to [place] due to increased bilirubin, now felt to be secondary to Gilbert's.   Past Medical History  Diagnosis Date  . Asthma   . Allergic rhinitis   . History of kidney stones   . H/O hiatal hernia   . GERD (gastroesophageal reflux disease)   . Pulmonary nodule   . Paroxysmal atrial fibrillation (HCC)     a. anticoagulated with Xarelto  . Atrial flutter ([place])   . Tachycardia-bradycardia syndrome ([place])     a. 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  [name] Anxiety   . Diverticulosis   . Internal hemorrhoid   . Arthritis   . Lung  cancer ([place])     a. Stage IB non-small cell carcinoma, s/p R VATS, wedge resection, RU lobectomy 10/2012.  . Diabetes mellitus without complication ([place])     steroid induced from chemo  . Cholelithiases 09/26/2015    Past Surgical History  Procedure Laterality Date  . Partial hysterectomy    . Video assisted thoracoscopy (vats)/wedge resection Right 10/27/2012    Procedure: VIDEO ASSISTED THORACOSCOPY (VATS),RGHT UPPER LOBE LUNG WEDGE RESECTION;  Surgeon: [name], MD;  Location: [facility];  Service: Thoracic;  Laterality: Right;  . Lobectomy Right 10/27/2012    Procedure: LOBECTOMY;  Surgeon: [name], MD;  Location: [facility];  Service: Thoracic;  Laterality: Right;   RIGHT UPPER LOBECTOMY & Node Dissection  . Colonoscopy with polypectomy  2015  . Lymph node biopsy Right 05/01/2014    Procedure: LYMPH NODE BIOPSY, Right Axillary;  Surgeon: [name], MD;  Location: [facility];  Service: Thoracic;  Laterality: Right;  . Permanent pacemaker insertion N/A 09/14/2012    Nanostim (SJM) leadless pacemaker (LEADLESS II STUDY PATIENT) implanted by Dr [name]  . Esophagogastroduodenoscopy      [facility]: bile reflux in stomach, mild gastritis, hiatal hernia  . Colonoscopy      [facility]: cattered sigmoid diverticula, internal hemorrhoids, sessile polyp in ascending and mid transverse colon (tubular adenoma).     Current Outpatient Prescriptions  Medication Sig Dispense Refill  . acetaminophen (TYLENOL) 500 MG tablet Take 500 mg by mouth every 6 (six) hours as needed for moderate pain or  fever.     . albuterol (PROVENTIL HFA;VENTOLIN HFA) 108 (90 BASE) MCG/ACT inhaler Inhale 1-2 puffs into the lungs every 6 (six) hours as needed for wheezing or shortness of breath.    [name] albuterol (PROVENTIL) (2.5 MG/3ML) 0.083% nebulizer solution Take 3 mLs (2.5 mg total) by nebulization every 2 (two) hours as needed for shortness of breath. 120 mL 0  . amiodarone (PACERONE) 100 MG tablet Take 1  tablet (100 mg total) by mouth daily. 90 tablet 3  . beclomethasone (QVAR) 40 MCG/ACT inhaler Inhale 2 puffs into the lungs daily. 1 Inhaler 3  . diltiazem (CARDIZEM CD) 240 MG 24 hr capsule Take 1 capsule (240 mg total) by mouth daily. 30 capsule 11  . fexofenadine (ALLEGRA) 180 MG tablet Take 180 mg by mouth daily.    . fluticasone (FLONASE) 50 MCG/ACT nasal spray Place 2 sprays into both nostrils daily as needed for allergies or rhinitis.    [name] ipratropium-albuterol (DUONEB) 0.5-2.5 (3) MG/3ML SOLN Take 3 mLs by nebulization every 6 (six) hours as needed.    . meclizine (ANTIVERT) 25 MG tablet Take 25 mg by mouth 3 (three) times daily as needed for dizziness or nausea.    . mometasone (NASONEX) 50 MCG/ACT nasal spray Place 1 spray into the nose daily. Two sprays each in each nostril 17 g 5  . montelukast (SINGULAIR) 10 MG tablet Take 10 mg by mouth at bedtime.   5  . Olopatadine HCl (PAZEO) 0.7 % SOLN Place 1 drop into both eyes 1 day or 1 dose. 1 Bottle 5  . pantoprazole (PROTONIX) 40 MG tablet Take 1 tablet (40 mg total) by mouth daily. 30 minutes before breakfast 90 tablet 3  . rivaroxaban (XARELTO) 20 MG TABS tablet Take 1 tablet (20 mg total) by mouth daily with supper. 30 tablet 11   No current facility-administered medications for this visit.    Allergies as of 01/09/2016 - Review Complete 01/09/2016  Allergen Reaction Noted  . Prednisone Shortness Of Breath and Other (See Comments) 06/24/2012  . Septra [sulfamethoxazole-trimethoprim] Shortness Of Breath 06/24/2012  . Penicillins Swelling and Rash 06/24/2012  . Vioxx [rofecoxib] Other (See Comments) 06/24/2012    Family History  Problem Relation Age of Onset  . Heart disease Mother   . Cancer Mother   . Diabetes Mother   . Asthma Father   . Heart disease Father   . Cancer Brother   . Diabetes Sister   . Cancer Sister   . Diabetes Brother   . Heart attack Son   . Colon cancer Neg Hx     Social History   Social History   . Marital Status: Single    Spouse Name: N/A  . Number of Children: N/A  . Years of Education: N/A   Social History Main Topics  . Smoking status: Former Smoker -- 2.00 packs/day for 40 years    Types: Cigarettes    Quit date: 08/05/1995  . Smokeless tobacco: Never Used  . Alcohol Use: No  . Drug Use: No  . Sexual Activity: Not Asked   Other Topics Concern  . None   Social History Narrative   Patient lives alone in [place].   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   Patient has 1 living daughter     Review of Systems: As mentioned in HPI.   Physical Exam: BP 118/59 mmHg  Pulse 68  Temp(Src) 97.6 F (36.4 C)  Ht '5\' 2"'$  (1.575 m)  Wt 153  lb 9.6 oz (69.673 kg)  BMI 28.09 kg/m2 General:   Alert and oriented. No distress noted. Pleasant and cooperative.  Head:  Normocephalic and atraumatic. Eyes:  Conjuctiva clear without scleral icterus. Mouth:  Oral mucosa pink and moist. Good dentition. No lesions. Heart:  S1, S2 present without murmurs, rubs, or gallops. Regular rate and rhythm. Abdomen:  +BS, soft, non-tender and non-distended. No rebound or guarding. Fullness appreciated in RUQ Msk:  Symmetrical without gross deformities. Normal posture. Extremities:  Without edema. Neurologic:  Alert and  oriented x4;  grossly normal neurologically. Psych:  Alert and cooperative. Normal mood and affect.  Lab Results  Component Value Date   ALT 25 12/25/2015   AST 35* 12/25/2015   ALKPHOS 118 12/25/2015   BILITOT 1.69* 12/25/2015   Lab Results  Component Value Date   WBC 4.7 12/25/2015   HGB 12.5 12/25/2015   HCT 36.9 12/25/2015   MCV 98.7 12/25/2015   PLT 168 12/25/2015

## 2016-01-09 NOTE — Patient Instructions (Addendum)
We are scheduling an upper endoscopy to screen for enlarged vessels in your esophagus with Dr. Oneida Alar.  I am asking Dr. [name] if we can stop the Xarelto 48 hours before the procedure.  We are referring you back to Dr. Arnoldo Morale after the upper endoscopy.   Please complete the Hepatitis A/B vaccination.   We will do another ultrasound of your liver in 6 months. I will see you in 6 months.

## 2016-01-10 ENCOUNTER — Encounter: Payer: Self-pay | Admitting: Gastroenterology

## 2016-01-16 ENCOUNTER — Telehealth: Payer: Self-pay | Admitting: Gastroenterology

## 2016-01-16 ENCOUNTER — Telehealth: Payer: Self-pay | Admitting: Internal Medicine

## 2016-01-16 NOTE — Telephone Encounter (Signed)
New message      Request for surgical clearance:  What type of surgery is being performed? Upper endo When is this surgery scheduled? Pending clearance Are there any medications that need to be held prior to surgery and how long? Hold xarelto 48hr prior to test Name of physician performing surgery?  Dr Oneida Alar 1. What is your office phone and fax number? Fax [place]

## 2016-01-16 NOTE — Telephone Encounter (Signed)
Pt called to say that she was seen recently and was supposed to get Hepatitis shots, but her insurance will not cover it. She said her insurance gave her a number for [place] to call in helping her get the shots. [phone]. Pt would like a call back and if she isn't home to leave her message so she will know what to do. [phone]

## 2016-01-16 NOTE — Telephone Encounter (Signed)
I will forward to Dr [name] for review.

## 2016-01-16 NOTE — Telephone Encounter (Signed)
I called Dr. Jackalyn Lombard office in [place] at [phone] and spoke to Butch Penny who took the message to ask Dr. [name]. She has our phone number and fax number and will let us know.

## 2016-01-18 NOTE — Assessment & Plan Note (Addendum)
70 year old female originally referred to [place] for elevated bilirubin felt to be secondary to Gilbert's after extensive work-up, and elastography noting F3/F4. 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 Last EGD at [facility] without evidence of portal hypertension. Colonoscopy due again in 2020. With new finding of cirrhosis on CT, recommend EGD for variceal screening as well as Hep A and B vaccination. We will also follow her for cirrhosis care. She is on Xarelto for afib, and we will ask Dr. [name] if we may hold this for 48 hours prior to endoscopic evaluation.    Proceed with upper endoscopy in the near future with Dr. Oneida Alar. The risks, benefits, and alternatives have been discussed in detail with patient. They have stated understanding and desire to proceed.  HOLD XARELTO for 48 hours: patient aware, cleared by cardiology to hold as well.  [place] in 6 months Follow-up with Dr. Arnoldo Morale after endoscopy (previously evaluated for cholecystectomy, still with mild intermittent discomfort to right of umbilicus that is sporadic but does not appear biliary in nature).  Return in Dec and will order [place] for hepatoma screening at that time

## 2016-01-18 NOTE — Telephone Encounter (Signed)
Pt has a CHADS score of 1 and no history of TIA or stroke.  Okay per protocol to hold Xarelto x 48 hours prior to procedure.  Will fax clearance to Dr. Oneida Alar office.

## 2016-01-19 NOTE — Telephone Encounter (Signed)
Will forward to anticoagulation clinic for management per protocol 

## 2016-01-21 ENCOUNTER — Other Ambulatory Visit: Payer: Self-pay

## 2016-01-21 DIAGNOSIS — I8510 Secondary esophageal varices without bleeding: Secondary | ICD-10-CM

## 2016-01-21 NOTE — Telephone Encounter (Signed)
Noted and agree. 

## 2016-01-21 NOTE — Telephone Encounter (Signed)
Forwarding to [place] to schedule. FYI to Neil Crouch, PA in Laban Emperor, NP, absence.

## 2016-01-21 NOTE — Telephone Encounter (Signed)
Pt has a CHADS score of 1.  No history of stroke or TIA.  Okay to hold Xarelto x 48 hours per protocol.

## 2016-01-21 NOTE — Telephone Encounter (Signed)
Pt called back and said she called them and was told that she may go to the drug store and get the vaccinations, which we had told her. She was just concerned that if there was anything owed that she would not have the funds to pay. I told her she will have to check with them.

## 2016-01-21 NOTE — Telephone Encounter (Signed)
Pt is set up for 01/31/16 @ 12:00 pm. Pt is aware and instructions are in the mail

## 2016-01-21 NOTE — Telephone Encounter (Signed)
I was unable to speak to anyone at the number provided. I called pt and told her to let them know to fax any request to [place] at [phone], and she said that she would.

## 2016-01-21 NOTE — Progress Notes (Signed)
CC'D TO PCP °

## 2016-01-31 ENCOUNTER — Encounter (HOSPITAL_COMMUNITY): Payer: Self-pay | Admitting: *Deleted

## 2016-01-31 ENCOUNTER — Encounter (HOSPITAL_COMMUNITY)
Admission: RE | Disposition: A | Discharge status: Home / Self Care | Payer: Self-pay | Source: Ambulatory Visit | Attending: Gastroenterology

## 2016-01-31 ENCOUNTER — Ambulatory Visit (HOSPITAL_COMMUNITY)
Admission: RE | Admit: 2016-01-31 | Discharge: 2016-01-31 | Disposition: A | Discharge status: Home / Self Care | Payer: Medicare Other | Source: Ambulatory Visit | Attending: Gastroenterology | Admitting: Gastroenterology

## 2016-01-31 DIAGNOSIS — T380X5A Adverse effect of glucocorticoids and synthetic analogues, initial encounter: Secondary | ICD-10-CM | POA: Diagnosis not present

## 2016-01-31 DIAGNOSIS — K222 Esophageal obstruction: Secondary | ICD-10-CM | POA: Insufficient documentation

## 2016-01-31 DIAGNOSIS — Z7951 Long term (current) use of inhaled steroids: Secondary | ICD-10-CM | POA: Diagnosis not present

## 2016-01-31 DIAGNOSIS — K449 Diaphragmatic hernia without obstruction or gangrene: Secondary | ICD-10-CM | POA: Insufficient documentation

## 2016-01-31 DIAGNOSIS — E099 Drug or chemical induced diabetes mellitus without complications: Secondary | ICD-10-CM | POA: Diagnosis not present

## 2016-01-31 DIAGNOSIS — I8510 Secondary esophageal varices without bleeding: Secondary | ICD-10-CM | POA: Insufficient documentation

## 2016-01-31 DIAGNOSIS — I480 Paroxysmal atrial fibrillation: Secondary | ICD-10-CM | POA: Insufficient documentation

## 2016-01-31 DIAGNOSIS — J45909 Unspecified asthma, uncomplicated: Secondary | ICD-10-CM | POA: Insufficient documentation

## 2016-01-31 DIAGNOSIS — K219 Gastro-esophageal reflux disease without esophagitis: Secondary | ICD-10-CM | POA: Insufficient documentation

## 2016-01-31 DIAGNOSIS — M1990 Unspecified osteoarthritis, unspecified site: Secondary | ICD-10-CM | POA: Diagnosis not present

## 2016-01-31 DIAGNOSIS — K766 Portal hypertension: Secondary | ICD-10-CM | POA: Diagnosis not present

## 2016-01-31 DIAGNOSIS — K3189 Other diseases of stomach and duodenum: Secondary | ICD-10-CM | POA: Diagnosis not present

## 2016-01-31 DIAGNOSIS — Z13810 Encounter for screening for upper gastrointestinal disorder: Secondary | ICD-10-CM | POA: Insufficient documentation

## 2016-01-31 DIAGNOSIS — I495 Sick sinus syndrome: Secondary | ICD-10-CM | POA: Insufficient documentation

## 2016-01-31 DIAGNOSIS — Z85118 Personal history of other malignant neoplasm of bronchus and lung: Secondary | ICD-10-CM | POA: Diagnosis not present

## 2016-01-31 DIAGNOSIS — Z950 Presence of cardiac pacemaker: Secondary | ICD-10-CM | POA: Diagnosis not present

## 2016-01-31 DIAGNOSIS — Z79899 Other long term (current) drug therapy: Secondary | ICD-10-CM | POA: Diagnosis not present

## 2016-01-31 DIAGNOSIS — Z7902 Long term (current) use of antithrombotics/antiplatelets: Secondary | ICD-10-CM | POA: Insufficient documentation

## 2016-01-31 DIAGNOSIS — Z7901 Long term (current) use of anticoagulants: Secondary | ICD-10-CM | POA: Diagnosis not present

## 2016-01-31 DIAGNOSIS — I4892 Unspecified atrial flutter: Secondary | ICD-10-CM | POA: Insufficient documentation

## 2016-01-31 DIAGNOSIS — F419 Anxiety disorder, unspecified: Secondary | ICD-10-CM | POA: Diagnosis not present

## 2016-01-31 HISTORY — PX: ESOPHAGOGASTRODUODENOSCOPY: SHX5428

## 2016-01-31 SURGERY — EGD (ESOPHAGOGASTRODUODENOSCOPY)
Anesthesia: Moderate Sedation

## 2016-01-31 MED ORDER — STERILE WATER FOR IRRIGATION IR SOLN
Status: DC | PRN
  Administered 2016-01-31: 11:00:00

## 2016-01-31 MED ORDER — SODIUM CHLORIDE 0.9 % IV SOLN
INTRAVENOUS | Status: DC
  Administered 2016-01-31: 11:00:00 via INTRAVENOUS

## 2016-01-31 MED ORDER — LIDOCAINE VISCOUS 2 % MT SOLN
OROMUCOSAL | Status: AC
  Filled 2016-01-31: qty 15

## 2016-01-31 MED ORDER — MEPERIDINE HCL 100 MG/ML IJ SOLN
INTRAMUSCULAR | Status: AC
  Filled 2016-01-31: qty 2

## 2016-01-31 MED ORDER — MEPERIDINE HCL 100 MG/ML IJ SOLN
INTRAMUSCULAR | Status: DC | PRN
  Administered 2016-01-31: 50 mg
  Administered 2016-01-31: 25 mg

## 2016-01-31 MED ORDER — MIDAZOLAM HCL 5 MG/5ML IJ SOLN
INTRAMUSCULAR | Status: DC | PRN
  Administered 2016-01-31 (×2): 2 mg via INTRAVENOUS

## 2016-01-31 MED ORDER — MIDAZOLAM HCL 5 MG/5ML IJ SOLN
INTRAMUSCULAR | Status: AC
  Filled 2016-01-31: qty 10

## 2016-01-31 MED ORDER — PANTOPRAZOLE SODIUM 40 MG PO TBEC: DELAYED_RELEASE_TABLET | ORAL | Status: DC

## 2016-01-31 MED ORDER — LIDOCAINE VISCOUS 2 % MT SOLN
OROMUCOSAL | Status: DC | PRN
  Administered 2016-01-31: 4 mL via OROMUCOSAL

## 2016-01-31 NOTE — Op Note (Signed)
[facility] Patient Name: Nancy Hodges Procedure Date: 01/31/2016 11:21 AM MRN: [id number] Date of Birth: 1946-04-06 Attending MD: Barney Drain , MD CSN: [id number] Age: 70 Admit Type: Outpatient Procedure:                Upper GI endoscopy, SCREENING Indications:              Variceal screening (no known varices or prior                            bleeding).OCCASIONAL HEARTBURN. Providers:                Barney Drain, MD, Tammy Vaught, RN, Purcell Nails                            Lemons, Technician Referring MD:              Medicines:                Meperidine 75 mg IV, Midazolam 4 mg IV Complications:            No immediate complications. Estimated Blood Loss:     Estimated blood loss was minimal. Procedure:                Pre-Anesthesia Assessment:                           - Prior to the procedure, a History and Physical                            was performed, and patient medications and                            allergies were reviewed. The patient's tolerance of                            previous anesthesia was also reviewed. The risks                            and benefits of the procedure and the sedation                            options and risks were discussed with the patient.                            All questions were answered, and informed consent                            was obtained. Prior Anticoagulants: The patient has                            taken no previous anticoagulant or antiplatelet                            agents. ASA Grade Assessment: II - A patient with  mild systemic disease. After reviewing the risks                            and benefits, the patient was deemed in                            satisfactory condition to undergo the procedure.                            After obtaining informed consent, the endoscope was                            passed under direct vision. Throughout the                             procedure, the patient's blood pressure, pulse, and                            oxygen saturations were monitored continuously. The                            EG-[id number] ([id number]) scope was introduced through the                            mouth, and advanced to the second part of duodenum.                            The upper GI endoscopy was accomplished without                            difficulty. The patient tolerated the procedure                            well. Scope In: 11:41:14 AM Scope Out: 11:45:33 AM Total Procedure Duration: 0 hours 4 minutes 19 seconds  Findings:      A low-grade of narrowing Schatzki ring (acquired) was found at the       gastroesophageal junction.      A small hiatal hernia was present.      Mild portal hypertensive gastropathy was found in the gastric fundus and       in the gastric body.      The examined duodenum was normal. Impression:               - Low-grade of narrowing Schatzki ring.                           - Small hiatal hernia.                           - MILD Portal hypertensive gastropathy. Moderate Sedation:      Moderate (conscious) sedation was administered by the endoscopy nurse       and supervised by the endoscopist. The following parameters were       monitored: oxygen saturation, heart rate, blood pressure, and response       to care.  Total physician intraservice time was 12 minutes. Recommendation:           - Low fat diet.                           - Continue present medications.                           - Repeat upper endoscopy in 3 years for                            surveillance.                           - Return to my office in 6 months.                           -RE-START XARELTO TODAY.                           -PROTONIX 30 MINS PRIOR TO BREAKFAST DAILY.                           - Patient has a contact number available for                            emergencies. The signs and symptoms of potential                             delayed complications were discussed with the                            patient. Return to normal activities tomorrow.                            Written discharge instructions were provided to the                            patient. Procedure Code(s):        --- Professional ---                           [id number], Esophagogastroduodenoscopy, flexible,                            transoral; diagnostic, including collection of                            specimen(s) by brushing or washing, when performed                            (separate procedure)                           99152, Moderate sedation services provided by the  same physician or other qualified health care                            professional performing the diagnostic or                            therapeutic service that the sedation supports,                            requiring the presence of an independent trained                            observer to assist in the monitoring of the                            patient's level of consciousness and physiological                            status; initial 15 minutes of intraservice time,                            patient age 43 years or older Diagnosis Code(s):        --- Professional ---                           K22.2, Esophageal obstruction                           K44.9, Diaphragmatic hernia without obstruction or                            gangrene                           K76.6, Portal hypertension                           K31.89, Other diseases of stomach and duodenum                           Z13.810, Encounter for screening for upper                            gastrointestinal disorder CPT copyright 2016 [employer]. All rights reserved. The codes documented in this report are preliminary and upon coder review may  be revised to meet current compliance requirements. Barney Drain, MD Barney Drain,  MD 01/31/2016 12:00:53 PM This report has been signed electronically. Number of Addenda: 0

## 2016-01-31 NOTE — Discharge Instructions (Signed)
You DID NOT HAVE esophageal varices. YOU HAVE A LOWER ESOPHAGEAL RING AND A SMALL HIATAL HERNIA.    RE-START XARELTO TODAY.  DRINK WATER TO KEEP YOUR URINE LIGHT YELLOW.  FOLLOW A LOW FAT DIET. MEATS SHOULD BE BAKED, BROILED, OR BOILED. AVOID FRIED FOODS. SEE INFO BELOW.  AVOID THINGS THAT TRIGGER FOR REFLUX. SEE INFO BELOW.  CONTINUE PROTONIX. TAKE 30 MINUTES PRIOR TO BREAKFAST.  FOLLOW UP IN DEC 2017.  REPEAT EGD IN 3 YEARS.    UPPER ENDOSCOPY AFTER CARE Read the instructions outlined below and refer to this sheet in the next week. These discharge instructions provide you with general information on caring for yourself after you leave the hospital. While your treatment has been planned according to the most current medical practices available, unavoidable complications occasionally occur. If you have any problems or questions after discharge, call DR. Marek Nghiem, [phone].  ACTIVITY  You may resume your regular activity, but move at a slower pace for the next 24 hours.   Take frequent rest periods for the next 24 hours.   Walking will help get rid of the air and reduce the bloated feeling in your belly (abdomen).   No driving for 24 hours (because of the medicine (anesthesia) used during the test).   You may shower.   Do not sign any important legal documents or operate any machinery for 24 hours (because of the anesthesia used during the test).    NUTRITION  Drink plenty of fluids.   You may resume your normal diet as instructed by your doctor.   Begin with a light meal and progress to your normal diet. Heavy or fried foods are harder to digest and may make you feel sick to your stomach (nauseated).   Avoid alcoholic beverages for 24 hours or as instructed.    MEDICATIONS  You may resume your normal medications.   WHAT YOU CAN EXPECT TODAY  Some feelings of bloating in the abdomen.   Passage of more gas than usual.    IF YOU HAD A BIOPSY TAKEN DURING  THE UPPER ENDOSCOPY:  Eat a soft diet IF YOU HAVE NAUSEA, BLOATING, ABDOMINAL PAIN, OR VOMITING.    FINDING OUT THE RESULTS OF YOUR TEST Not all test results are available during your visit. DR. Oneida Alar WILL CALL YOU WITHIN 7 DAYS OF YOUR PROCEDUE WITH YOUR RESULTS. Do not assume everything is normal if you have not heard from DR. Tyannah Sane IN ONE WEEK, CALL HER OFFICE AT [phone].  SEEK IMMEDIATE MEDICAL ATTENTION AND CALL THE OFFICE: [phone] IF:  You have more than a spotting of blood in your stool.   Your belly is swollen (abdominal distention).   You are nauseated or vomiting.   You have a temperature over 101F.   You have abdominal pain or discomfort that is severe or gets worse throughout the day.     Lifestyle and home remedies TO HELP CONTROL REFLUX/HEARTBURN You may eliminate or reduce the frequency of heartburn by making the following lifestyle changes:   Control your weight. Being overweight is a major risk factor for heartburn and GERD. Excess pounds put pressure on your abdomen, pushing up your stomach and causing acid to back up into your esophagus.    Eat smaller meals. 4 TO 6 MEALS A DAY. This reduces pressure on the lower esophageal sphincter, helping to prevent the valve from opening and acid from washing back into your esophagus.    Loosen your belt. Clothes that fit tightly around your  waist put pressure on your abdomen and the lower esophageal sphincter.     Eliminate heartburn triggers. Everyone has specific triggers.Common triggers such as fatty or fried foods, spicy food, tomato sauce, carbonated beverages, alcohol, chocolate, mint, garlic, onion, caffeine and nicotine may make heartburn worse.    Avoid stooping or bending. Tying your shoes is OK. Bending over for longer periods to weed your garden isn't, especially soon after eating.    Don't lie down after a meal. Wait at least three to four hours after eating before going to bed OR BENDING  OVER.  Alternative medicine  Several home remedies exist for treating GERD, but they provide only temporary relief. They include drinking baking soda (sodium bicarbonate) added to water or drinking other fluids such as baking soda mixed with cream of tartar and water.  Although these liquids create temporary relief by neutralizing, washing away or buffering acids, eventually they aggravate the situation by adding gas and fluid to your stomach, increasing pressure and causing more acid reflux. Further, adding more sodium to your diet may increase your blood pressure and add stress to your heart, and excessive bicarbonate ingestion can alter the acid-base balance in your body.  Low-Fat Diet BREADS, CEREALS, PASTA, RICE, DRIED PEAS, AND BEANS These products are high in carbohydrates and most are low in fat. Therefore, they can be increased in the diet as substitutes for fatty foods. They too, however, contain calories and should not be eaten in excess. Cereals can be eaten for snacks as well as for breakfast.   FRUITS AND VEGETABLES It is good to eat fruits and vegetables. Besides being sources of fiber, both are rich in vitamins and some minerals. They help you get the daily allowances of these nutrients. Fruits and vegetables can be used for snacks and desserts.  MEATS Limit lean meat, chicken, [place], and fish to no more than 6 ounces per day. Beef, Pork, and Lamb Use lean cuts of beef, pork, and lamb. Lean cuts include:  Extra-lean ground beef.  Arm roast.  Sirloin tip.  Center-cut ham.  Round steak.  Loin chops.  Rump roast.  Tenderloin.  Trim all fat off the outside of meats before cooking. It is not necessary to severely decrease the intake of red meat, but lean choices should be made. Lean meat is rich in protein and contains a highly absorbable form of iron. Premenopausal women, in particular, should avoid reducing lean red meat because this could increase the risk for low red blood  cells (iron-deficiency anemia).  Chicken and [place] These are good sources of protein. The fat of poultry can be reduced by removing the skin and underlying fat layers before cooking. Chicken and [place] can be substituted for lean red meat in the diet. Poultry should not be fried or covered with high-fat sauces. Fish and Shellfish Fish is a good source of protein. Shellfish contain cholesterol, but they usually are low in saturated fatty acids. The preparation of fish is important. Like chicken and [place], they should not be fried or covered with high-fat sauces. EGGS Egg whites contain no fat or cholesterol. They can be eaten often. Try 1 to 2 egg whites instead of whole eggs in recipes or use egg substitutes that do not contain yolk. MILK AND DAIRY PRODUCTS Use skim or 1% milk instead of 2% or whole milk. Decrease whole milk, natural, and processed cheeses. Use nonfat or low-fat (2%) cottage cheese or low-fat cheeses made from vegetable oils. Choose nonfat or low-fat (1  to 2%) yogurt. Experiment with evaporated skim milk in recipes that call for heavy cream. Substitute low-fat yogurt or low-fat cottage cheese for sour cream in dips and salad dressings. Have at least 2 servings of low-fat dairy products, such as 2 glasses of skim (or 1%) milk each day to help get your daily calcium intake. FATS AND OILS Reduce the total intake of fats, especially saturated fat. Butterfat, lard, and beef fats are high in saturated fat and cholesterol. These should be avoided as much as possible. Vegetable fats do not contain cholesterol, but certain vegetable fats, such as coconut oil, palm oil, and palm kernel oil are very high in saturated fats. These should be limited. These fats are often used in bakery goods, processed foods, popcorn, oils, and nondairy creamers. Vegetable shortenings and some peanut butters contain hydrogenated oils, which are also saturated fats. Read the labels on these foods and check for  saturated vegetable oils. Unsaturated vegetable oils and fats do not raise blood cholesterol. However, they should be limited because they are fats and are high in calories. Total fat should still be limited to 30% of your daily caloric intake. Desirable liquid vegetable oils are corn oil, cottonseed oil, olive oil, canola oil, safflower oil, soybean oil, and sunflower oil. Peanut oil is not as good, but small amounts are acceptable. Buy a heart-healthy tub margarine that has no partially hydrogenated oils in the ingredients. Mayonnaise and salad dressings often are made from unsaturated fats, but they should also be limited because of their high calorie and fat content. Seeds, nuts, peanut butter, olives, and avocados are high in fat, but the fat is mainly the unsaturated type. These foods should be limited mainly to avoid excess calories and fat. OTHER EATING TIPS Snacks  Most sweets should be limited as snacks. They tend to be rich in calories and fats, and their caloric content outweighs their nutritional value. Some good choices in snacks are graham crackers, melba toast, soda crackers, bagels (no egg), English muffins, fruits, and vegetables. These snacks are preferable to snack crackers, [place] fries, TORTILLA CHIPS, and POTATO chips. Popcorn should be air-popped or cooked in small amounts of liquid vegetable oil. Desserts Eat fruit, low-fat yogurt, and fruit ices instead of pastries, cake, and cookies. Sherbet, angel food cake, gelatin dessert, frozen low-fat yogurt, or other frozen products that do not contain saturated fat (pure fruit juice bars, frozen ice pops) are also acceptable.  COOKING METHODS Choose those methods that use little or no fat. They include: Poaching.  Braising.  Steaming.  Grilling.  Baking.  Stir-frying.  Broiling.  Microwaving.  Foods can be cooked in a nonstick pan without added fat, or use a nonfat cooking spray in regular cookware. Limit fried foods and avoid  frying in saturated fat. Add moisture to lean meats by using water, broth, cooking wines, and other nonfat or low-fat sauces along with the cooking methods mentioned above. Soups and stews should be chilled after cooking. The fat that forms on top after a few hours in the refrigerator should be skimmed off. When preparing meals, avoid using excess salt. Salt can contribute to raising blood pressure in some people.  EATING AWAY FROM HOME Order entres, potatoes, and vegetables without sauces or butter. When meat exceeds the size of a deck of cards (3 to 4 ounces), the rest can be taken home for another meal. Choose vegetable or fruit salads and ask for low-calorie salad dressings to be served on the side. Use dressings sparingly.  Limit high-fat toppings, such as bacon, crumbled eggs, cheese, sunflower seeds, and olives. Ask for heart-healthy tub margarine instead of butter.

## 2016-01-31 NOTE — Progress Notes (Signed)
REVIEWED-NO ADDITIONAL RECOMMENDATIONS. 

## 2016-01-31 NOTE — H&P (Addendum)
Primary Care Physician:  [name] Primary Gastroenterologist:  Dr. Oneida Alar  Pre-Procedure History & Physical: HPI:  Nancy Hodges is a 70 y.o. female here for SCREENING FOR VARICES.  Past Medical History  Diagnosis Date  . Asthma   . Allergic rhinitis   . History of kidney stones   . H/O hiatal hernia   . GERD (gastroesophageal reflux disease)   . Pulmonary nodule   . Paroxysmal atrial fibrillation (HCC)     a. anticoagulated with Xarelto  . Atrial flutter ([facility])   . Tachycardia-bradycardia syndrome ([place])     a. 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  [name] Anxiety   . Diverticulosis   . Internal hemorrhoid   . Arthritis   . Lung cancer ([facility])     a. Stage IB non-small cell carcinoma, s/p R VATS, wedge resection, RU lobectomy 10/2012.  . Diabetes mellitus without complication ([facility])     steroid induced from chemo  . Cholelithiases 09/26/2015    Past Surgical History  Procedure Laterality Date  . Partial hysterectomy    . Video assisted thoracoscopy (vats)/wedge resection Right 10/27/2012    Procedure: VIDEO ASSISTED THORACOSCOPY (VATS),RGHT UPPER LOBE LUNG WEDGE RESECTION;  Surgeon: [name], MD;  Location: [facility];  Service: Thoracic;  Laterality: Right;  . Lobectomy Right 10/27/2012    Procedure: LOBECTOMY;  Surgeon: [name], MD;  Location: [facility];  Service: Thoracic;  Laterality: Right;   RIGHT UPPER LOBECTOMY & Node Dissection  . Colonoscopy with polypectomy  2015  . Lymph node biopsy Right 05/01/2014    Procedure: LYMPH NODE BIOPSY, Right Axillary;  Surgeon: [name], MD;  Location: [facility];  Service: Thoracic;  Laterality: Right;  . Permanent pacemaker insertion N/A 09/14/2012    Nanostim (SJM) leadless pacemaker (LEADLESS II STUDY PATIENT) implanted by Dr [name]  . Esophagogastroduodenoscopy      [place]: bile reflux in stomach, mild gastritis, hiatal hernia  . Colonoscopy      [place]: cattered sigmoid diverticula, internal  hemorrhoids, sessile polyp in ascending and mid transverse colon (tubular adenoma).     Prior to Admission medications   Medication Sig Start Date End Date Taking? Authorizing Provider  acetaminophen (TYLENOL) 500 MG tablet Take 500 mg by mouth every 6 (six) hours as needed for moderate pain or fever.    Yes Historical Provider, MD  albuterol (PROVENTIL HFA;VENTOLIN HFA) 108 (90 BASE) MCG/ACT inhaler Inhale 1-2 puffs into the lungs every 6 (six) hours as needed for wheezing or shortness of breath.   Yes Historical Provider, MD  amiodarone (PACERONE) 100 MG tablet Take 1 tablet (100 mg total) by mouth daily. 05/02/15  Yes [name], MD  beclomethasone (QVAR) 40 MCG/ACT inhaler Inhale 2 puffs into the lungs daily. 12/06/15  Yes Roselyn Malachy Moan, MD  diltiazem (CARDIZEM CD) 240 MG 24 hr capsule Take 1 capsule (240 mg total) by mouth daily. 04/11/15  Yes [name], MD  fexofenadine (ALLEGRA) 180 MG tablet Take 180 mg by mouth daily.   Yes Historical Provider, MD  meclizine (ANTIVERT) 25 MG tablet Take 25 mg by mouth 3 (three) times daily as needed for dizziness or nausea.   Yes Historical Provider, MD  montelukast (SINGULAIR) 10 MG tablet Take 10 mg by mouth at bedtime.  03/10/15  Yes Historical Provider, MD  Olopatadine HCl (PAZEO) 0.7 % SOLN Place 1 drop into both eyes 1 day or 1 dose. 12/05/15  Yes Roselyn Malachy Moan, MD  rivaroxaban Alveda Reasons) 20  MG TABS tablet Take 1 tablet (20 mg total) by mouth daily with supper. 04/11/15  Yes [name], MD  albuterol (PROVENTIL) (2.5 MG/3ML) 0.083% nebulizer solution Take 3 mLs (2.5 mg total) by nebulization every 2 (two) hours as needed for shortness of breath. 01/24/13   Brand Males, MD  fluticasone (FLONASE) 50 MCG/ACT nasal spray Place 2 sprays into both nostrils daily as needed for allergies or rhinitis.    Historical Provider, MD  ipratropium-albuterol (DUONEB) 0.5-2.5 (3) MG/3ML SOLN Take 3 mLs by nebulization every 6 (six) hours as needed.    Historical  Provider, MD  mometasone (NASONEX) 50 MCG/ACT nasal spray Place 1 spray into the nose daily. Two sprays each in each nostril 12/05/15   Roselyn Malachy Moan, MD  pantoprazole (PROTONIX) 40 MG tablet Take 1 tablet (40 mg total) by mouth daily. 30 minutes before breakfast 11/23/15   Orvil Feil, NP    Allergies as of 01/21/2016 - Review Complete 01/09/2016  Allergen Reaction Noted  . Prednisone Shortness Of Breath and Other (See Comments) 06/24/2012  . Septra [sulfamethoxazole-trimethoprim] Shortness Of Breath 06/24/2012  . Penicillins Swelling and Rash 06/24/2012  . Vioxx [rofecoxib] Other (See Comments) 06/24/2012    Family History  Problem Relation Age of Onset  . Heart disease Mother   . Cancer Mother   . Diabetes Mother   . Asthma Father   . Heart disease Father   . Cancer Brother   . Diabetes Sister   . Cancer Sister   . Diabetes Brother   . Heart attack Son   . Colon cancer Neg Hx     Social History   Social History  . Marital Status: Single    Spouse Name: N/A  . Number of Children: N/A  . Years of Education: N/A   Occupational History  . Not on file.   Social History Main Topics  . Smoking status: Former Smoker -- 2.00 packs/day for 40 years    Types: Cigarettes    Quit date: 08/05/1995  . Smokeless tobacco: Never Used  . Alcohol Use: No  . Drug Use: No  . Sexual Activity: Not on file   Other Topics Concern  . Not on file   Social History Narrative   Patient lives alone in [place].   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   Patient has 1 living daughter     Review of Systems: See HPI, otherwise negative ROS   Physical Exam: BP 140/51 mmHg  Pulse 72  Temp(Src) 98.2 F (36.8 C) (Oral)  Resp 13  Ht '5\' 2"'$  (1.575 m)  Wt 153 lb (69.4 kg)  BMI 27.98 kg/m2  SpO2 98% General:   Alert,  pleasant and cooperative in NAD Head:  Normocephalic and atraumatic. Neck:  Supple; Lungs:  Clear throughout to auscultation.    Heart:  Regular rate and  IRREGULAR rhythm. Abdomen:  Soft, nontender and nondistended. Normal bowel sounds, without guarding, and without rebound.   Neurologic:  Alert and  oriented x4;  grossly normal neurologically.  Impression/Plan:     SCREENING VARICES  PLAN:  1.EGD TODAY

## 2016-02-04 ENCOUNTER — Encounter (HOSPITAL_COMMUNITY): Payer: Self-pay | Admitting: Gastroenterology

## 2016-02-19 ENCOUNTER — Ambulatory Visit (INDEPENDENT_AMBULATORY_CARE_PROVIDER_SITE_OTHER): Payer: Medicare Other | Admitting: Allergy and Immunology

## 2016-02-19 VITALS — BP 120/50 | HR 70 | Temp 97.8°F | Resp 16

## 2016-02-19 DIAGNOSIS — H1010 Acute atopic conjunctivitis, unspecified eye: Secondary | ICD-10-CM | POA: Diagnosis not present

## 2016-02-19 DIAGNOSIS — R05 Cough: Secondary | ICD-10-CM

## 2016-02-19 DIAGNOSIS — J309 Allergic rhinitis, unspecified: Secondary | ICD-10-CM

## 2016-02-19 DIAGNOSIS — R059 Cough, unspecified: Secondary | ICD-10-CM

## 2016-02-19 MED ORDER — BUDESONIDE 90 MCG/ACT IN AEPB: INHALATION_SPRAY | RESPIRATORY_TRACT | Status: DC

## 2016-02-19 MED ORDER — AZELASTINE HCL 0.1 % NA SOLN: 1.0000 | Freq: Every day | NASAL | Status: DC | PRN

## 2016-02-19 NOTE — Progress Notes (Signed)
FOLLOW UP NOTE  RE: VELINDA WROBEL MRN: [id number] DOB: 14-May-1946 [facility] [address] [place], [place] [address] Date of Office Visit: 02/19/2016  Subjective:  JAKIYAH STEPNEY is a 70 y.o. female who presents today for Allergic Rhinitis  and Asthma  Assessment:   1. Cough, appears multi-factorial now improved. (previous Asthma diagnosis).  2. Allergic rhinoconjunctivitis.  3.      Complex medical history--including history of pacemaker and lung cancer in 2014 4.      History inconsistent with any prednisone Allergy. Plan:   Meds ordered this encounter  Medications  . Budesonide (PULMICORT FLEXHALER) 90 MCG/ACT inhaler    Sig: Use 1-2 puffs each morning then rinse, gargle and spit with water after each use.    Dispense:  1 Inhaler    Refill:  3  . azelastine (ASTELIN) 0.1 % nasal spray    Sig: Place 1 spray into both nostrils daily as needed.    Dispense:  30 mL    Refill:  3  1.  Stop QVAR. 2.  Begin Pulmicort 38mg 1-2 puffs each morning--then rinse, gargle and spit with water. 3.  Continue Allegra and Singulair daily. 4.  Azelastine one spray each nostril once daily as needed for congestion/drainage. 5.  ProAir or albuterol every 4 hours as needed---and reviewed at length the difference between her inhaler/neb medications and the importance of not overusing bronchodilator including Duoneb, which can contribute to increased heart rate. 6.  Follow-up in 2-3 months or sooner if needed---consider need for pulmonology referral, if any persisting symptoms or medication intolerances.  HPI: EFarrenreturns to the office in follow-up of allergic rhinoconjunctivitis and cough. Since her initial visit in May, she notices a great improvement in particular with the addition of maintenance medications.  However, she has recently discontinued the Qvar because she thought it triggered increased heart rate.  She states it was not Albuterol HFA or  neb, which she only used 3 times since May visit and has no heart rate changes in the recent weeks.  She is feeling overall better without wheeze, difficulty in breathing or shortness of breath. Nasonex is beneficial as well, only using a few times a week.  She denies any headache, fever, wheeze or other difficulties and now only slight cough off maintenance medications.  Denies ED or urgent care visits or antibiotic courses. Reports sleep and activity are normal.  EYarrowhas a current medication list which includes the following prescription(s): acetaminophen, albuterol, albuterol, amiodarone, diltiazem, fexofenadine, ipratropium-albuterol, meclizine, montelukast, olopatadine hcl, pantoprazole, rivaroxaban, twinrix.   Drug Allergies: Allergies  Allergen Reactions      . Septra [Sulfamethoxazole-Trimethoprim] Shortness Of Breath  . Penicillins Swelling and Rash  . Vioxx [Rofecoxib] Other (See Comments)    REACTION:  Stomach cramps   Objective:   Filed Vitals:   02/19/16 1008  BP: 120/50  Pulse: 70  Temp: 97.8 F (36.6 C)  Resp: 16   SpO2 Readings from Last 1 Encounters:  02/19/16 98%   Physical Exam  Constitutional: She is well-developed, well-nourished, and in no distress.  Alert interactive communicating easily in full sentences without cough.  HENT:  Head: Atraumatic.  Right Ear: Tympanic membrane and ear canal normal.  Left Ear: Tympanic membrane and ear canal normal.  Nose: Mucosal edema present. No rhinorrhea. No epistaxis.  Mouth/Throat: Oropharynx is clear and moist and mucous membranes are normal. No oropharyngeal exudate, posterior oropharyngeal edema or posterior oropharyngeal erythema.  Neck: Neck supple.  Cardiovascular: Normal rate, S1 normal and S2 normal.   No murmur heard. Pulmonary/Chest: Effort normal. She has no wheezes. She has no rhonchi. She has no rales.  Lymphadenopathy:    She has no cervical adenopathy.   Diagnostics: Spirometry:  FVC 1.74 ---  78%, FEV1 1.31 --- 71%.      Roselyn M. Ishmael Holter, MD  cc: [name], PA-C

## 2016-02-19 NOTE — Patient Instructions (Addendum)
    Stop QVAR.  Begin Pulmicort 70mg 1-2 puffs each morning--then rinse, gargle and spit with water.  Continue Allegra and Singulair daily.  Azelastine one spray each nostril once daily as needed for congestion/drainage.   ProAir or albuterol every 4 hours as needed.  Follow-up in 2-3 months or sooner if needed.

## 2016-02-23 ENCOUNTER — Encounter: Payer: Self-pay | Admitting: Allergy and Immunology

## 2016-02-29 ENCOUNTER — Telehealth: Payer: Self-pay

## 2016-02-29 NOTE — Telephone Encounter (Signed)
Lm for pt to call us back. Per Dr. Ishmael Holter she needs to know how she is doing and to let her know that her insurance is not going to cover Pulmicort. We may consider trial of Flovent after communication with patient. (Pt felt that she did not tolerate Qvar)

## 2016-03-25 ENCOUNTER — Other Ambulatory Visit (HOSPITAL_BASED_OUTPATIENT_CLINIC_OR_DEPARTMENT_OTHER): Payer: Medicare Other

## 2016-03-25 ENCOUNTER — Encounter (HOSPITAL_COMMUNITY): Payer: Self-pay

## 2016-03-25 ENCOUNTER — Ambulatory Visit (HOSPITAL_COMMUNITY)
Admission: RE | Admit: 2016-03-25 | Discharge: 2016-03-25 | Disposition: A | Discharge status: Home / Self Care | Payer: Medicare Other | Source: Ambulatory Visit | Attending: Internal Medicine | Admitting: Internal Medicine

## 2016-03-25 DIAGNOSIS — I700 Atherosclerosis of aorta: Secondary | ICD-10-CM | POA: Diagnosis not present

## 2016-03-25 DIAGNOSIS — R161 Splenomegaly, not elsewhere classified: Secondary | ICD-10-CM | POA: Diagnosis not present

## 2016-03-25 DIAGNOSIS — Z950 Presence of cardiac pacemaker: Secondary | ICD-10-CM | POA: Diagnosis not present

## 2016-03-25 DIAGNOSIS — J432 Centrilobular emphysema: Secondary | ICD-10-CM | POA: Insufficient documentation

## 2016-03-25 DIAGNOSIS — C3491 Malignant neoplasm of unspecified part of right bronchus or lung: Secondary | ICD-10-CM | POA: Insufficient documentation

## 2016-03-25 DIAGNOSIS — K760 Fatty (change of) liver, not elsewhere classified: Secondary | ICD-10-CM | POA: Diagnosis not present

## 2016-03-25 DIAGNOSIS — C3411 Malignant neoplasm of upper lobe, right bronchus or lung: Secondary | ICD-10-CM | POA: Diagnosis not present

## 2016-03-25 DIAGNOSIS — R911 Solitary pulmonary nodule: Secondary | ICD-10-CM | POA: Insufficient documentation

## 2016-03-25 DIAGNOSIS — R590 Localized enlarged lymph nodes: Secondary | ICD-10-CM | POA: Insufficient documentation

## 2016-03-25 DIAGNOSIS — K8020 Calculus of gallbladder without cholecystitis without obstruction: Secondary | ICD-10-CM | POA: Diagnosis not present

## 2016-03-25 DIAGNOSIS — I2510 Atherosclerotic heart disease of native coronary artery without angina pectoris: Secondary | ICD-10-CM | POA: Diagnosis not present

## 2016-03-25 DIAGNOSIS — N2889 Other specified disorders of kidney and ureter: Secondary | ICD-10-CM | POA: Diagnosis not present

## 2016-03-25 LAB — CBC WITH DIFFERENTIAL/PLATELET
BASO%: 0.5 % (ref 0.0–2.0)
BASOS ABS: 0 10*3/uL (ref 0.0–0.1)
EOS ABS: 0.2 10*3/uL (ref 0.0–0.5)
EOS%: 3.9 % (ref 0.0–7.0)
HCT: 34.1 % — ABNORMAL LOW (ref 34.8–46.6)
HGB: 11.7 g/dL (ref 11.6–15.9)
LYMPH%: 25.4 % (ref 14.0–49.7)
MCH: 33.3 pg (ref 25.1–34.0)
MCHC: 34.3 g/dL (ref 31.5–36.0)
MCV: 97.2 fL (ref 79.5–101.0)
MONO#: 0.4 10*3/uL (ref 0.1–0.9)
MONO%: 8.9 % (ref 0.0–14.0)
NEUT%: 61.3 % (ref 38.4–76.8)
NEUTROS ABS: 2.7 10*3/uL (ref 1.5–6.5)
Platelets: 160 10*3/uL (ref 145–400)
RBC: 3.51 10*6/uL — AB (ref 3.70–5.45)
RDW: 14.5 % (ref 11.2–14.5)
WBC: 4.4 10*3/uL (ref 3.9–10.3)
lymph#: 1.1 10*3/uL (ref 0.9–3.3)

## 2016-03-25 LAB — COMPREHENSIVE METABOLIC PANEL
ALT: 24 U/L (ref 0–55)
AST: 36 U/L — ABNORMAL HIGH (ref 5–34)
Albumin: 3.6 g/dL (ref 3.5–5.0)
Alkaline Phosphatase: 127 U/L (ref 40–150)
Anion Gap: 9 mEq/L (ref 3–11)
BUN: 12.1 mg/dL (ref 7.0–26.0)
CHLORIDE: 103 meq/L (ref 98–109)
CO2: 28 meq/L (ref 22–29)
Calcium: 9.9 mg/dL (ref 8.4–10.4)
Creatinine: 0.8 mg/dL (ref 0.6–1.1)
EGFR: 75 mL/min/{1.73_m2} — AB (ref >90)
GLUCOSE: 122 mg/dL (ref 70–140)
POTASSIUM: 4.8 meq/L (ref 3.5–5.1)
SODIUM: 140 meq/L (ref 136–145)
Total Bilirubin: 1.89 mg/dL — ABNORMAL HIGH (ref 0.20–1.20)
Total Protein: 7.2 g/dL (ref 6.4–8.3)

## 2016-03-25 MED ORDER — IOPAMIDOL (ISOVUE-300) INJECTION 61%
100.0000 mL | Freq: Once | INTRAVENOUS | Status: AC | PRN
  Administered 2016-03-25: 100 mL via INTRAVENOUS

## 2016-04-01 ENCOUNTER — Encounter: Payer: Self-pay | Admitting: Internal Medicine

## 2016-04-01 ENCOUNTER — Ambulatory Visit (HOSPITAL_BASED_OUTPATIENT_CLINIC_OR_DEPARTMENT_OTHER): Payer: Medicare Other | Admitting: Internal Medicine

## 2016-04-01 ENCOUNTER — Telehealth: Payer: Self-pay | Admitting: Internal Medicine

## 2016-04-01 VITALS — BP 149/48 | HR 65 | Temp 97.9°F | Resp 18 | Ht 62.0 in | Wt 152.6 lb

## 2016-04-01 DIAGNOSIS — C3411 Malignant neoplasm of upper lobe, right bronchus or lung: Secondary | ICD-10-CM | POA: Diagnosis not present

## 2016-04-01 DIAGNOSIS — C3491 Malignant neoplasm of unspecified part of right bronchus or lung: Secondary | ICD-10-CM

## 2016-04-01 DIAGNOSIS — C773 Secondary and unspecified malignant neoplasm of axilla and upper limb lymph nodes: Secondary | ICD-10-CM

## 2016-04-01 NOTE — Telephone Encounter (Signed)
Gave patient avs report and appointments for September. Central radiology will call re scan - patient aware.

## 2016-04-01 NOTE — Progress Notes (Signed)
[place] [phone]   [phone]  OFFICE PROGRESS NOTE  [name], PA-C [address] [place] [address]  DIAGNOSIS: Metastatic non-small cell lung cancer, adenocarcinoma. This was initially diagnosed as stage IB (T2a, N0, M0) In March of 2014, status post right upper lobectomy with lymph node dissection but the patient has evidence for disease recurrence in the right axilla status post resection.  PRIOR THERAPY:  1) Status post right axillary lymph node biopsy on 05/01/2014 under the care of Dr. Roxan Hockey. 2) Systemic chemotherapy with carboplatin for AUC of 5 and Alimta 500 mg/M2 every 3 weeks. First cycle 05/31/2014. Status post 6 cycles. Last dose was given 09/22/2014.  CURRENT THERAPY: Observation.  INTERVAL HISTORY: Nancy Hodges 70 y.o. female returns to the clinic today for followup visit accompanied by a friend. She is feeling fine today with no specific complaints except for mild fatigue and shortness of breath with exertion. She denied having any significant chest pain, cough or hemoptysis. She has no weight loss or night sweats. She denied having any significant nausea or vomiting, no fever or chills. She had repeat CT scan of the chest, abdomen and pelvis performed recently and she is here for evaluation and discussion of her scan results.  MEDICAL HISTORY: Past Medical History:  Diagnosis Date  . Allergic rhinitis   . Anxiety   . Arthritis   . Asthma   . Atrial flutter ([place])   . Cholelithiases 09/26/2015  . Diabetes mellitus without complication ([place])    steroid induced from chemo  . Diverticulosis   . GERD (gastroesophageal reflux disease)   . H/O hiatal hernia   . History of kidney stones   . Internal hemorrhoid   . Lung cancer ([place])    a. Stage IB non-small cell carcinoma, s/p R VATS, wedge resection, RU lobectomy 10/2012.  . Paroxysmal atrial fibrillation (H[facility])    a. anticoagulated with Xarelto    . Pulmonary nodule   . Tachycardia-bradycardia syndrome ([place])    a. 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    ALLERGIES:  is allergic to prednisone; septra [sulfamethoxazole-trimethoprim]; penicillins; and vioxx [rofecoxib].  MEDICATIONS:  Current Outpatient Prescriptions  Medication Sig Dispense Refill  . acetaminophen (TYLENOL) 500 MG tablet Take 500 mg by mouth every 6 (six) hours as needed for moderate pain or fever.     [name] albuterol (PROVENTIL HFA;VENTOLIN HFA) 108 (90 BASE) MCG/ACT inhaler Inhale 1-2 puffs into the lungs every 6 (six) hours as needed for wheezing or shortness of breath.    [name] albuterol (PROVENTIL) (2.5 MG/3ML) 0.083% nebulizer solution Take 3 mLs (2.5 mg total) by nebulization every 2 (two) hours as needed for shortness of breath. 120 mL 0  . amiodarone (PACERONE) 100 MG tablet Take 1 tablet (100 mg total) by mouth daily. 90 tablet 3  . diltiazem (CARDIZEM CD) 240 MG 24 hr capsule Take 1 capsule (240 mg total) by mouth daily. 30 capsule 11  . fexofenadine (ALLEGRA) 180 MG tablet Take 180 mg by mouth daily.    . fluticasone (FLONASE) 50 MCG/ACT nasal spray Place 2 sprays into both nostrils daily as needed for allergies or rhinitis. Reported on 02/19/2016    . ipratropium-albuterol (DUONEB) 0.5-2.5 (3) MG/3ML SOLN Take 3 mLs by nebulization every 6 (six) hours as needed.    . meclizine (ANTIVERT) 25 MG tablet Take 25 mg by mouth 3 (three) times daily as needed for dizziness or nausea.    [name]  montelukast (SINGULAIR) 10 MG tablet Take 10 mg by mouth at bedtime.   5  . Olopatadine HCl (PAZEO) 0.7 % SOLN Place 1 drop into both eyes 1 day or 1 dose. 1 Bottle 5  . pantoprazole (PROTONIX) 40 MG tablet 1 PO 30 minutes before breakfast 90 tablet 3  . rivaroxaban (XARELTO) 20 MG TABS tablet Take 1 tablet (20 mg total) by mouth daily with supper. 30 tablet 11  . azelastine (ASTELIN) 0.1 % nasal spray Place 1 spray into both nostrils daily as needed. (Patient not taking: Reported on  04/01/2016) 30 mL 3  . beclomethasone (QVAR) 40 MCG/ACT inhaler Inhale 2 puffs into the lungs daily. (Patient not taking: Reported on 02/19/2016) 1 Inhaler 3  . Budesonide (PULMICORT FLEXHALER) 90 MCG/ACT inhaler Use 1-2 puffs each morning then rinse, gargle and spit with water after each use. (Patient not taking: Reported on 04/01/2016) 1 Inhaler 3  . mometasone (NASONEX) 50 MCG/ACT nasal spray Place 1 spray into the nose daily. Two sprays each in each nostril (Patient not taking: Reported on 02/19/2016) 17 g 5  . TWINRIX 720-20 injection ADM 1ML IM UTD  0   No current facility-administered medications for this visit.     SURGICAL HISTORY:  Past Surgical History:  Procedure Laterality Date  . COLONOSCOPY     [facility]: cattered sigmoid diverticula, internal hemorrhoids, sessile polyp in ascending and mid transverse colon (tubular adenoma).   . colonoscopy with polypectomy  2015  . ESOPHAGOGASTRODUODENOSCOPY     [facility]: bile reflux in stomach, mild gastritis, hiatal hernia  . ESOPHAGOGASTRODUODENOSCOPY N/A 01/31/2016   Procedure: ESOPHAGOGASTRODUODENOSCOPY (EGD);  Surgeon: [name], MD;  [facility];  Service: Endoscopy;  Laterality: N/A;  1200  . LOBECTOMY Right 10/27/2012   Procedure: LOBECTOMY;  Surgeon: [name], MD;  Location: [facility];  Service: Thoracic;  Laterality: Right;   RIGHT UPPER LOBECTOMY & Node Dissection  . LYMPH NODE BIOPSY Right 05/01/2014   Procedure: LYMPH NODE BIOPSY, Right Axillary;  Surgeon: [name], MD;  Location: [facility];  Service: Thoracic;  Laterality: Right;  . PARTIAL HYSTERECTOMY    . PERMANENT PACEMAKER INSERTION N/A 09/14/2012   Nanostim (SJM) leadless pacemaker (LEADLESS II STUDY PATIENT) implanted by Dr [name]  . VIDEO ASSISTED THORACOSCOPY (VATS)/WEDGE RESECTION Right 10/27/2012   Procedure: VIDEO ASSISTED THORACOSCOPY (VATS),RGHT UPPER LOBE LUNG WEDGE RESECTION;  Surgeon: [name], MD;  Location: [facility];   Service: Thoracic;  Laterality: Right;    REVIEW OF SYSTEMS:  A comprehensive review of systems was negative except for: Constitutional: positive for fatigue Respiratory: positive for dyspnea on exertion   PHYSICAL EXAMINATION: General appearance: alert, cooperative and no distress Head: Normocephalic, without obvious abnormality, atraumatic Neck: no adenopathy, no JVD, supple, symmetrical, trachea midline and thyroid not enlarged, symmetric, no tenderness/mass/nodules Lymph nodes: Cervical, supraclavicular, and axillary nodes normal. Resp: clear to auscultation bilaterally Back: symmetric, no curvature. ROM normal. No CVA tenderness. Cardio: regular rate and rhythm, S1, S2 normal, no murmur, click, rub or gallop GI: soft, non-tender; bowel sounds normal; no masses,  no organomegaly Extremities: extremities normal, atraumatic, no cyanosis or edema Neurologic: Alert and oriented X 3, normal strength and tone. Normal symmetric reflexes. Normal coordination and gait  ECOG PERFORMANCE STATUS: 1 - Symptomatic but completely ambulatory  Blood pressure (!) 149/48, pulse 65, temperature 97.9 F (36.6 C), temperature source Oral, resp. rate 18, height 5' 2" (1.575 m), weight 152 lb 9.6 oz (69.2 kg), SpO2 98 %.  LABORATORY DATA: Lab Results  Component Value Date   WBC 4.4 03/25/2016   HGB 11.7 03/25/2016   HCT 34.1 (L) 03/25/2016   MCV 97.2 03/25/2016   PLT 160 03/25/2016      Chemistry      Component Value Date/Time   NA 140 03/25/2016 0959   K 4.8 03/25/2016 0959   CL 106 08/06/2015 1332   CO2 28 03/25/2016 0959   BUN 12.1 03/25/2016 0959   CREATININE 0.8 03/25/2016 0959      Component Value Date/Time   CALCIUM 9.9 03/25/2016 0959   ALKPHOS 127 03/25/2016 0959   AST 36 (H) 03/25/2016 0959   ALT 24 03/25/2016 0959   BILITOT 1.89 (H) 03/25/2016 0959       RADIOGRAPHIC STUDIES: Ct Chest W Contrast  Result Date: 03/25/2016 CLINICAL DATA:  Restaging of lung cancer,  chemotherapy completed in 2016. Right upper lobectomy. EXAM: CT CHEST, ABDOMEN, AND PELVIS WITH CONTRAST TECHNIQUE: Multidetector CT imaging of the chest, abdomen and pelvis was performed following the standard protocol during bolus administration of intravenous contrast. CONTRAST:  140m ISOVUE-300 IOPAMIDOL (ISOVUE-300) INJECTION 61% COMPARISON:  Multiple exams, including 12/25/2015 FINDINGS: CT CHEST FINDINGS Cardiovascular: Coronary, aortic arch, and branch vessel atherosclerotic vascular disease. Nanostem permanent pacemaker projects within the right ventricle. Mediastinum/Nodes: 8 mm in short axis right internal mammary lymph node, stable on image 23/2. On the same image there is a 5 mm left internal mammary lymph node. Lungs/Pleura: Right upper lobectomy.  Centrilobular emphysema. Mild nodularity along the lateral margin of the minor fissure, 4 mm in diameter, similar to prior. Right lower lobe pulmonary nodule, 0.8 by 0.6 cm, previously 0.6 by 0.5 cm by my measurements on 12/25/2015, indicating progressive enlargement. 0.5 by 0.6 cm left lower lobe nodule on image 63/4, formerly 0.5 cm on 12/25/2015 in formerly only about 2 mm in diameter on 06/13/2015. 4 mm in diameter right lower lobe nodule on image 81/4, previously 2-3 mm by my measurement. New 3 mm nodule in the left lower lobe on image 93/4. Lingular nodule 1.1 by 0.9 cm on image 78/4, previously 0.7 by 0.6 cm. Additional enlarging left upper lobe nodules observed. Musculoskeletal: Is unremarkable CT ABDOMEN PELVIS FINDINGS Hepatobiliary: Punctate calcifications in the liver, likely from remote inflammatory process. Morphologic findings of early hepatic cirrhosis. Mild hepatic steatosis. Multiple gallstones in the gallbladder up up to about 10 mm in diameter. No biliary dilatation. Pancreas: Unremarkable Spleen: The spleen measures 14.9 by 5.9 by 11.4 cm (volume = 520 cm^3), compatible splenomegaly. 0.8 cm hypodensity inferiorly in the spleen on image  62/2. Small accessory spleen just below the main spleen. Adrenals/Urinary Tract: Scarring in the left kidney, otherwise unremarkable. Stomach/Bowel: Descending colon diverticulosis. Vascular/Lymphatic: A peripancreatic node anterior to the portal vein measures 1.2 cm in short axis on image 64/2, stable. Portacaval node 1.4 cm in short axis on image 61/2, stable. A third porta hepatis node measuring 1.5 cm in short axis is present on image 62 series 2, stable. Aortocaval node 0.8 cm on image 70/2, previously 0.7 cm. Aortoiliac atherosclerotic vascular disease. Reproductive: Ovaries unremarkable.  Uterus not seen. Other: No supplemental non-categorized findings. Musculoskeletal: Moderate degenerative chondral thinning in both hips. IMPRESSION: 1. Continued enlargement of bilateral pulmonary nodules, favoring metastatic disease to the lungs. 2. Splenomegaly and adenopathy in the porta hepatis may be secondary to underlying cirrhosis rather than malignancy. 3. Other imaging findings of potential clinical significance: Coronary, aortic arch, and branch vessel atherosclerotic vascular disease. Permanent pacemaker in the  right ventricle. Centrilobular emphysema. Cholelithiasis. Mild hepatic steatosis. Mild scarring in the left kidney. Aortoiliac atherosclerotic vascular disease. Moderate degenerative chondral thinning in both hips. Electronically Signed   By: Van Clines M.D.   On: 03/25/2016 15:11   Ct Abdomen Pelvis W Contrast  Result Date: 03/25/2016 CLINICAL DATA:  Restaging of lung cancer, chemotherapy completed in 2016. Right upper lobectomy. EXAM: CT CHEST, ABDOMEN, AND PELVIS WITH CONTRAST TECHNIQUE: Multidetector CT imaging of the chest, abdomen and pelvis was performed following the standard protocol during bolus administration of intravenous contrast. CONTRAST:  138m ISOVUE-300 IOPAMIDOL (ISOVUE-300) INJECTION 61% COMPARISON:  Multiple exams, including 12/25/2015 FINDINGS: CT CHEST FINDINGS  Cardiovascular: Coronary, aortic arch, and branch vessel atherosclerotic vascular disease. Nanostem permanent pacemaker projects within the right ventricle. Mediastinum/Nodes: 8 mm in short axis right internal mammary lymph node, stable on image 23/2. On the same image there is a 5 mm left internal mammary lymph node. Lungs/Pleura: Right upper lobectomy.  Centrilobular emphysema. Mild nodularity along the lateral margin of the minor fissure, 4 mm in diameter, similar to prior. Right lower lobe pulmonary nodule, 0.8 by 0.6 cm, previously 0.6 by 0.5 cm by my measurements on 12/25/2015, indicating progressive enlargement. 0.5 by 0.6 cm left lower lobe nodule on image 63/4, formerly 0.5 cm on 12/25/2015 in formerly only about 2 mm in diameter on 06/13/2015. 4 mm in diameter right lower lobe nodule on image 81/4, previously 2-3 mm by my measurement. New 3 mm nodule in the left lower lobe on image 93/4. Lingular nodule 1.1 by 0.9 cm on image 78/4, previously 0.7 by 0.6 cm. Additional enlarging left upper lobe nodules observed. Musculoskeletal: Is unremarkable CT ABDOMEN PELVIS FINDINGS Hepatobiliary: Punctate calcifications in the liver, likely from remote inflammatory process. Morphologic findings of early hepatic cirrhosis. Mild hepatic steatosis. Multiple gallstones in the gallbladder up up to about 10 mm in diameter. No biliary dilatation. Pancreas: Unremarkable Spleen: The spleen measures 14.9 by 5.9 by 11.4 cm (volume = 520 cm^3), compatible splenomegaly. 0.8 cm hypodensity inferiorly in the spleen on image 62/2. Small accessory spleen just below the main spleen. Adrenals/Urinary Tract: Scarring in the left kidney, otherwise unremarkable. Stomach/Bowel: Descending colon diverticulosis. Vascular/Lymphatic: A peripancreatic node anterior to the portal vein measures 1.2 cm in short axis on image 64/2, stable. Portacaval node 1.4 cm in short axis on image 61/2, stable. A third porta hepatis node measuring 1.5 cm in  short axis is present on image 62 series 2, stable. Aortocaval node 0.8 cm on image 70/2, previously 0.7 cm. Aortoiliac atherosclerotic vascular disease. Reproductive: Ovaries unremarkable.  Uterus not seen. Other: No supplemental non-categorized findings. Musculoskeletal: Moderate degenerative chondral thinning in both hips. IMPRESSION: 1. Continued enlargement of bilateral pulmonary nodules, favoring metastatic disease to the lungs. 2. Splenomegaly and adenopathy in the porta hepatis may be secondary to underlying cirrhosis rather than malignancy. 3. Other imaging findings of potential clinical significance: Coronary, aortic arch, and branch vessel atherosclerotic vascular disease. Permanent pacemaker in the right ventricle. Centrilobular emphysema. Cholelithiasis. Mild hepatic steatosis. Mild scarring in the left kidney. Aortoiliac atherosclerotic vascular disease. Moderate degenerative chondral thinning in both hips. Electronically Signed   By: WVan ClinesM.D.   On: 03/25/2016 15:11   ASSESSMENT AND PLAN: This is a very pleasant 70years old white female recently diagnosed with metastatic non-small cell lung cancer, adenocarcinoma with negative EGFR mutation and negative ALK gene translocation. This was initially diagnosed as stage IB status post right upper lobectomy but the patient had evidence for  disease recurrence in the right axilla status post resection. She is also status post 6 cycles of systemic chemotherapy with carboplatin and Alimta. The patient tolerated her treatment fairly well with no significant adverse effects. The patient has been observation and the recent CT scan of the chest showed continued enlargement of bilateral pulmonary nodules concerning for metastatic disease to the lung. I discussed the scan results with the patient today. I recommended for her to have a PET scan for further evaluation of these pulmonary nodules. I would see her back for follow-up visit in 3 weeks for  evaluation and discussion of her PET scan results and further recommendation regarding her condition. She was also advised to call me immediately if she develop any jaundice or had any other significant complaints. The patient voices understanding of current disease status and treatment options and is in agreement with the current care plan.  All questions were answered. The patient knows to call the clinic with any problems, questions or concerns. We can certainly see the patient much sooner if necessary.  Disclaimer: This note was dictated with voice recognition software. Similar sounding words can inadvertently be transcribed and may not be corrected upon review.

## 2016-04-02 ENCOUNTER — Other Ambulatory Visit: Payer: Self-pay | Admitting: *Deleted

## 2016-04-02 MED ORDER — DILTIAZEM HCL ER COATED BEADS 240 MG PO CP24: 240.0000 mg | ORAL_CAPSULE | Freq: Every day | ORAL | 0 refills | Status: DC

## 2016-04-09 ENCOUNTER — Encounter: Payer: Self-pay | Admitting: Internal Medicine

## 2016-04-09 ENCOUNTER — Encounter: Payer: Medicare Other | Admitting: Internal Medicine

## 2016-04-09 ENCOUNTER — Encounter (INDEPENDENT_AMBULATORY_CARE_PROVIDER_SITE_OTHER): Payer: Self-pay

## 2016-04-09 ENCOUNTER — Ambulatory Visit (INDEPENDENT_AMBULATORY_CARE_PROVIDER_SITE_OTHER): Payer: Medicare Other | Admitting: Internal Medicine

## 2016-04-09 DIAGNOSIS — I480 Paroxysmal atrial fibrillation: Secondary | ICD-10-CM

## 2016-04-09 DIAGNOSIS — I495 Sick sinus syndrome: Secondary | ICD-10-CM | POA: Diagnosis not present

## 2016-04-09 DIAGNOSIS — C3491 Malignant neoplasm of unspecified part of right bronchus or lung: Secondary | ICD-10-CM

## 2016-04-09 LAB — CUP PACEART INCLINIC DEVICE CHECK
Brady Statistic RV Percent Paced: 0 %
Lead Channel Pacing Threshold Pulse Width: 0.4 ms
Lead Channel Sensing Intrinsic Amplitude: 12 mV
Lead Channel Setting Pacing Pulse Width: 0.4 ms
MDC IDC MSMT LEADCHNL RV PACING THRESHOLD AMPLITUDE: 0.5 V
MDC IDC SESS DTM: 20170906161914
MDC IDC SET LEADCHNL RV PACING AMPLITUDE: 1.5 V
MDC IDC SET LEADCHNL RV SENSING SENSITIVITY: 2 mV
Pulse Gen Serial Number: 1095

## 2016-04-09 MED ORDER — DILTIAZEM HCL ER COATED BEADS 240 MG PO CP24: 240.0000 mg | ORAL_CAPSULE | Freq: Every day | ORAL | 11 refills | Status: DC

## 2016-04-09 MED ORDER — RIVAROXABAN 20 MG PO TABS: 20.0000 mg | ORAL_TABLET | Freq: Every day | ORAL | 11 refills | Status: DC

## 2016-04-09 NOTE — Patient Instructions (Signed)
Medication Instructions:  Your physician has recommended you make the following change in your medication:  STOP: Amiodarone  Labwork: None ordered  Testing/Procedures: None ordered  Follow-Up: Your physician wants you to follow-up in: 6 months with Chanetta Marshall, NP and 12 months with Dr. [name].  You will receive a reminder letter in the mail two months in advance. If you don't receive a letter, please call our office to schedule the follow-up appointment.     If you need a refill on your cardiac medications before your next appointment, please call your pharmacy.

## 2016-04-09 NOTE — Progress Notes (Signed)
PPCP: [name], PA-C  Nancy Hodges is a 70 y.o. female who presents today for routine electrophysiology followup.  She is doing very well.   Rare palpitations.  She has been recently diagnosed with cirrhosis.  Today, she denies symptoms of chest pain, lower extremity edema, dizziness, presyncope, or syncope. She has only very rare palpitations (every few months lasting about an hour). The patient is otherwise without complaint today.   Past Medical History:  Diagnosis Date  . Allergic rhinitis   . Anxiety   . Arthritis   . Asthma   . Atrial flutter ([place])   . Cholelithiases 09/26/2015  . Diabetes mellitus without complication ([place])    steroid induced from chemo  . Diverticulosis   . GERD (gastroesophageal reflux disease)   . H/O hiatal hernia   . History of kidney stones   . Internal hemorrhoid   . Lung cancer ([place])    a. Stage IB non-small cell carcinoma, s/p R VATS, wedge resection, RU lobectomy 10/2012.  . Paroxysmal atrial fibrillation ([facility])    a. anticoagulated with Xarelto  . Pulmonary nodule   . Tachycardia-bradycardia syndrome ([facility])    a. 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   Past Surgical History:  Procedure Laterality Date  . COLONOSCOPY     [facility]: cattered sigmoid diverticula, internal hemorrhoids, sessile polyp in ascending and mid transverse colon (tubular adenoma).   . colonoscopy with polypectomy  2015  . ESOPHAGOGASTRODUODENOSCOPY     [facility]: bile reflux in stomach, mild gastritis, hiatal hernia  . ESOPHAGOGASTRODUODENOSCOPY N/A 01/31/2016   Procedure: ESOPHAGOGASTRODUODENOSCOPY (EGD);  Surgeon: [name], MD;  [facility];  Service: Endoscopy;  Laterality: N/A;  1200  . LOBECTOMY Right 10/27/2012   Procedure: LOBECTOMY;  Surgeon: [name], MD;  Location: [facility];  Service: Thoracic;  Laterality: Right;   RIGHT UPPER LOBECTOMY & Node Dissection  . LYMPH NODE BIOPSY Right 05/01/2014   Procedure: LYMPH NODE BIOPSY, Right  Axillary;  Surgeon: [name], MD;  Location: [facility];  Service: Thoracic;  Laterality: Right;  . PARTIAL HYSTERECTOMY    . PERMANENT PACEMAKER INSERTION N/A 09/14/2012   Nanostim (SJM) leadless pacemaker (LEADLESS II STUDY PATIENT) implanted by Dr [name]  . VIDEO ASSISTED THORACOSCOPY (VATS)/WEDGE RESECTION Right 10/27/2012   Procedure: VIDEO ASSISTED THORACOSCOPY (VATS),RGHT UPPER LOBE LUNG WEDGE RESECTION;  Surgeon: [name], MD;  Location: [facility];  Service: Thoracic;  Laterality: Right;    Current Outpatient Prescriptions  Medication Sig Dispense Refill  . acetaminophen (TYLENOL) 500 MG tablet Take 500 mg by mouth every 6 (six) hours as needed for moderate pain or fever.     [name] albuterol (PROVENTIL HFA;VENTOLIN HFA) 108 (90 BASE) MCG/ACT inhaler Inhale 1-2 puffs into the lungs every 6 (six) hours as needed for wheezing or shortness of breath.    [name] albuterol (PROVENTIL) (2.5 MG/3ML) 0.083% nebulizer solution Take 3 mLs (2.5 mg total) by nebulization every 2 (two) hours as needed for shortness of breath. 120 mL 0  . amiodarone (PACERONE) 100 MG tablet Take 1 tablet (100 mg total) by mouth daily. 90 tablet 3  . diltiazem (CARDIZEM CD) 240 MG 24 hr capsule Take 1 capsule (240 mg total) by mouth daily. 30 capsule 0  . fexofenadine (ALLEGRA) 180 MG tablet Take 180 mg by mouth daily.    . fluticasone (FLONASE) 50 MCG/ACT nasal spray Place 2 sprays into both nostrils daily as needed for allergies or rhinitis. Reported on 02/19/2016    .  hepatitis A-hepatitis B (TWINRIX) 720-20 ELU-MCG/ML injection Inject 1 mL into the muscle once.    [name] ipratropium-albuterol (DUONEB) 0.5-2.5 (3) MG/3ML SOLN Take 3 mLs by nebulization every 6 (six) hours as needed (shortness of breath or wheezing).     . meclizine (ANTIVERT) 25 MG tablet Take 25 mg by mouth 3 (three) times daily as needed for dizziness or nausea.    . montelukast (SINGULAIR) 10 MG tablet Take 10 mg by mouth at bedtime.   5  .  Olopatadine HCl (PAZEO) 0.7 % SOLN Place 1 drop into both eyes 1 day or 1 dose. 1 Bottle 5  . rivaroxaban (XARELTO) 20 MG TABS tablet Take 1 tablet (20 mg total) by mouth daily with supper. 30 tablet 11   No current facility-administered medications for this visit.     Physical Exam: Vitals:   04/09/16 1511  BP: 132/64  Pulse: 73  SpO2: 95%  Weight: 154 lb 12.8 oz (70.2 kg)  Height: '5\' 2"'$  (1.575 m)    GEN- The patient is well appearing, alert and oriented x 3 today.   Head- normocephalic, atraumatic Eyes-  Sclera clear, conjunctiva pink Ears- hearing intact Oropharynx- clear Lungs- Clear to ausculation bilaterally, normal work of breathing Heart- Regular rate and rhythm, no murmurs, rubs or gallops, PMI not laterally displaced GI- soft, NT, ND, + BS Extremities- no clubbing, cyanosis, or edema  Pacemaker interrogation- reviewed in detail today,  See PACEART report ekg today reveals sinus rhythm  Assessment and Plan:  1. Tachycardia/ Bradycardia Normal pacemaker function See Pace Art report No changes today  2. afib continue xarelto '20mg'$  daily Given recently diagnosed cirrhosis, will stop amiodarone. If she has further afib, we will consider sotalol vs tikosyn  Return in 6 months to see EP NP I will see in a year  [name] MD, [facility] 04/09/2016 4:03 PM

## 2016-04-22 ENCOUNTER — Ambulatory Visit: Payer: Medicare Other | Admitting: Allergy & Immunology

## 2016-04-22 ENCOUNTER — Ambulatory Visit: Payer: Medicare Other | Admitting: Internal Medicine

## 2016-04-24 ENCOUNTER — Encounter (HOSPITAL_COMMUNITY)
Admission: RE | Admit: 2016-04-24 | Discharge: 2016-04-24 | Disposition: A | Discharge status: Home / Self Care | Payer: Medicare Other | Source: Ambulatory Visit | Attending: Internal Medicine | Admitting: Internal Medicine

## 2016-04-24 DIAGNOSIS — C3491 Malignant neoplasm of unspecified part of right bronchus or lung: Secondary | ICD-10-CM | POA: Insufficient documentation

## 2016-04-24 LAB — GLUCOSE, CAPILLARY: Glucose-Capillary: 133 mg/dL — ABNORMAL HIGH (ref 65–99)

## 2016-04-24 MED ORDER — FLUDEOXYGLUCOSE F - 18 (FDG) INJECTION
7.5000 | Freq: Once | INTRAVENOUS | Status: AC | PRN
  Administered 2016-04-24: 7.5 via INTRAVENOUS

## 2016-04-28 ENCOUNTER — Ambulatory Visit (HOSPITAL_BASED_OUTPATIENT_CLINIC_OR_DEPARTMENT_OTHER): Payer: Medicare Other | Admitting: Internal Medicine

## 2016-04-28 ENCOUNTER — Encounter: Payer: Self-pay | Admitting: Internal Medicine

## 2016-04-28 VITALS — BP 148/47 | HR 71 | Temp 98.6°F | Resp 17 | Ht 62.0 in | Wt 152.2 lb

## 2016-04-28 DIAGNOSIS — C773 Secondary and unspecified malignant neoplasm of axilla and upper limb lymph nodes: Secondary | ICD-10-CM

## 2016-04-28 DIAGNOSIS — C3411 Malignant neoplasm of upper lobe, right bronchus or lung: Secondary | ICD-10-CM

## 2016-04-28 DIAGNOSIS — C3491 Malignant neoplasm of unspecified part of right bronchus or lung: Secondary | ICD-10-CM

## 2016-04-28 NOTE — Progress Notes (Signed)
[facility] [phone]   Fax:([phone]) [phone]  OFFICE PROGRESS NOTE  [name], PA-C [address] [place] [address]  DIAGNOSIS: Metastatic non-small cell lung cancer, adenocarcinoma. This was initially diagnosed as stage IB (T2a, N0, M0) In March of 2014, status post right upper lobectomy with lymph node dissection but the patient has evidence for disease recurrence in the right axilla status post resection.  PRIOR THERAPY:  1) Status post right axillary lymph node biopsy on 05/01/2014 under the care of Dr. Roxan Hockey. 2) Systemic chemotherapy with carboplatin for AUC of 5 and Alimta 500 mg/M2 every 3 weeks. First cycle 05/31/2014. Status post 6 cycles. Last dose was given 09/22/2014.  CURRENT THERAPY: The patient is expected to start palliative radiotherapy to the subcarinal lymph node under the care of Dr. Sondra Come in the next few weeks.  INTERVAL HISTORY: Nancy Hodges 70 y.o. female returns to the clinic today for followup visit accompanied by a friend. She is feeling fine today with no specific complaints except for mild fatigue and shortness of breath with exertion. She denied having any significant chest pain, cough or hemoptysis. She has no weight loss or night sweats. She denied having any significant nausea or vomiting, no fever or chills. She was found on previous CT scan of the chest to have enlargement of mediastinal lymph nodes. I ordered a PET scan and the patient is here today for evaluation and discussion of her scan results and treatment options.  MEDICAL HISTORY: Past Medical History:  Diagnosis Date  . Allergic rhinitis   . Anxiety   . Arthritis   . Asthma   . Atrial flutter ([facility])   . Cholelithiases 09/26/2015  . Diabetes mellitus without complication ([facility])    steroid induced from chemo  . Diverticulosis   . GERD (gastroesophageal reflux disease)   . H/O hiatal hernia   . History of kidney stones   . Internal  hemorrhoid   . Lung cancer ([place])    a. Stage IB non-small cell carcinoma, s/p R VATS, wedge resection, RU lobectomy 10/2012.  . Paroxysmal atrial fibrillation ([facility])    a. anticoagulated with Xarelto  . Pulmonary nodule   . Tachycardia-bradycardia syndrome ([place])    a. 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    ALLERGIES:  is allergic to prednisone; septra [sulfamethoxazole-trimethoprim]; penicillins; and vioxx [rofecoxib].  MEDICATIONS:  Current Outpatient Prescriptions  Medication Sig Dispense Refill  . acetaminophen (TYLENOL) 500 MG tablet Take 500 mg by mouth every 6 (six) hours as needed for moderate pain or fever.     [name] albuterol (PROVENTIL HFA;VENTOLIN HFA) 108 (90 BASE) MCG/ACT inhaler Inhale 1-2 puffs into the lungs every 6 (six) hours as needed for wheezing or shortness of breath.    [name] albuterol (PROVENTIL) (2.5 MG/3ML) 0.083% nebulizer solution Take 3 mLs (2.5 mg total) by nebulization every 2 (two) hours as needed for shortness of breath. 120 mL 0  . diltiazem (CARDIZEM CD) 240 MG 24 hr capsule Take 1 capsule (240 mg total) by mouth daily. 30 capsule 11  . fexofenadine (ALLEGRA) 180 MG tablet Take 180 mg by mouth daily.    . fluticasone (FLONASE) 50 MCG/ACT nasal spray Place 2 sprays into both nostrils daily as needed for allergies or rhinitis. Reported on 02/19/2016    . hepatitis A-hepatitis B (TWINRIX) 720-20 ELU-MCG/ML injection Inject 1 mL into the muscle once.    [name] ipratropium-albuterol (DUONEB) 0.5-2.5 (3) MG/3ML SOLN Take 3 mLs  by nebulization every 6 (six) hours as needed (shortness of breath or wheezing).     . meclizine (ANTIVERT) 25 MG tablet Take 25 mg by mouth 3 (three) times daily as needed for dizziness or nausea.    . montelukast (SINGULAIR) 10 MG tablet Take 10 mg by mouth at bedtime.   5  . Olopatadine HCl (PAZEO) 0.7 % SOLN Place 1 drop into both eyes 1 day or 1 dose. 1 Bottle 5  . rivaroxaban (XARELTO) 20 MG TABS tablet Take 1 tablet (20 mg total) by mouth daily  with supper. 30 tablet 11   No current facility-administered medications for this visit.     SURGICAL HISTORY:  Past Surgical History:  Procedure Laterality Date  . COLONOSCOPY     [facility]: cattered sigmoid diverticula, internal hemorrhoids, sessile polyp in ascending and mid transverse colon (tubular adenoma).   . colonoscopy with polypectomy  2015  . ESOPHAGOGASTRODUODENOSCOPY     [facility]: bile reflux in stomach, mild gastritis, hiatal hernia  . ESOPHAGOGASTRODUODENOSCOPY N/A 01/31/2016   Procedure: ESOPHAGOGASTRODUODENOSCOPY (EGD);  Surgeon: [name], MD;  [facility];  Service: Endoscopy;  Laterality: N/A;  1200  . LOBECTOMY Right 10/27/2012   Procedure: LOBECTOMY;  Surgeon: [name], MD;  Location: [facility];  Service: Thoracic;  Laterality: Right;   RIGHT UPPER LOBECTOMY & Node Dissection  . LYMPH NODE BIOPSY Right 05/01/2014   Procedure: LYMPH NODE BIOPSY, Right Axillary;  Surgeon: [name], MD;  Location: [facility];  Service: Thoracic;  Laterality: Right;  . PARTIAL HYSTERECTOMY    . PERMANENT PACEMAKER INSERTION N/A 09/14/2012   Nanostim (SJM) leadless pacemaker (LEADLESS II STUDY PATIENT) implanted by Dr [name]  . VIDEO ASSISTED THORACOSCOPY (VATS)/WEDGE RESECTION Right 10/27/2012   Procedure: VIDEO ASSISTED THORACOSCOPY (VATS),RGHT UPPER LOBE LUNG WEDGE RESECTION;  Surgeon: [name], MD;  Location: [facility];  Service: Thoracic;  Laterality: Right;    REVIEW OF SYSTEMS:  Constitutional: positive for fatigue Eyes: negative Ears, nose, mouth, throat, and face: negative Respiratory: positive for dyspnea on exertion Cardiovascular: negative Gastrointestinal: negative Genitourinary:negative Integument/breast: negative Hematologic/lymphatic: negative Musculoskeletal:negative Neurological: negative Behavioral/Psych: negative Endocrine: negative Allergic/Immunologic: negative   PHYSICAL EXAMINATION: General appearance: alert,  cooperative and no distress Head: Normocephalic, without obvious abnormality, atraumatic Neck: no adenopathy, no JVD, supple, symmetrical, trachea midline and thyroid not enlarged, symmetric, no tenderness/mass/nodules Lymph nodes: Cervical, supraclavicular, and axillary nodes normal. Resp: clear to auscultation bilaterally Back: symmetric, no curvature. ROM normal. No CVA tenderness. Cardio: regular rate and rhythm, S1, S2 normal, no murmur, click, rub or gallop GI: soft, non-tender; bowel sounds normal; no masses,  no organomegaly Extremities: extremities normal, atraumatic, no cyanosis or edema Neurologic: Alert and oriented X 3, normal strength and tone. Normal symmetric reflexes. Normal coordination and gait  ECOG PERFORMANCE STATUS: 1 - Symptomatic but completely ambulatory  Blood pressure (!) 148/47, pulse 71, temperature 98.6 F (37 C), temperature source Oral, resp. rate 17, height 5' 2"  (1.575 m), weight 152 lb 3.2 oz (69 kg), SpO2 100 %.  LABORATORY DATA: Lab Results  Component Value Date   WBC 4.4 03/25/2016   HGB 11.7 03/25/2016   HCT 34.1 (L) 03/25/2016   MCV 97.2 03/25/2016   PLT 160 03/25/2016      Chemistry      Component Value Date/Time   NA 140 03/25/2016 0959   K 4.8 03/25/2016 0959   CL 106 08/06/2015 1332   CO2 28 03/25/2016 0959   BUN 12.1 03/25/2016  5885   CREATININE 0.8 03/25/2016 0959      Component Value Date/Time   CALCIUM 9.9 03/25/2016 0959   ALKPHOS 127 03/25/2016 0959   AST 36 (H) 03/25/2016 0959   ALT 24 03/25/2016 0959   BILITOT 1.89 (H) 03/25/2016 0959       RADIOGRAPHIC STUDIES: Nm Pet Image Restag (ps) Skull Base To Thigh  Result Date: 04/24/2016 CLINICAL DATA:  Restaging treatment strategy for non-small cell lung cancer. EXAM: NUCLEAR MEDICINE PET SKULL BASE TO THIGH TECHNIQUE: 7.5 mCi F-18 FDG was injected intravenously. Full-ring PET imaging was performed from the skull base to thigh after the radiotracer. CT data was obtained  and used for attenuation correction and anatomic localization. FASTING BLOOD GLUCOSE:  Value: 133 mg/dl COMPARISON:  PET-CT 05/17/2014 and CT from 05/17/2014 FINDINGS: NECK No hypermetabolic lymph nodes in the neck. CHEST New hypermetabolic sub- carinal lymph node has an SUV max equal to 8.35. Right internal mammary lymph node measures 9 mm and has an SUV max equal to 4.07. On the previous exam this measured 9 mm and had an SUV max equal to 2.7. Mild changes of centrilobular emphysema. Pulmonary nodule in the left lower lobe measures 7 mm. This is compared with 6 mm previously and is too small to reliably characterize by PET-CT, image 31 of series 8. 6 mm left lower lobe lung nodules also too small to characterize by PET-CT, image 37 of series 8. Previously 4 mm. Right lung base nodule measures 7 mm, image 55 of series 8. Stable from previous exam. ABDOMEN/PELVIS Calcified granulomas identified within the liver. No suspicious liver abnormality. Morphologic features of cirrhosis are again noted. Gallstones are again identified. No biliary dilatation. The spleen is enlarged. Stable 9 mm low-attenuation structure within the spleen, image 105 of series 4. No abnormal areas of increased uptake within the spleen. Gallstones noted. No hypermetabolic lymph nodes in the abdomen or pelvis. Stable 10 mm periportal lymph nodes, image number 109 of series 4. No significant FDG uptake associated with this node. 1.1 cm portacaval node is identified, image 106 of series 4. SUV max associated this node is equal to 6.07. This is not significantly changed from CT dated 03/25/2016. Aortocaval node measures 7 mm and has an SUV max equal to the 3.5. This is unchanged in size from 03/25/2016. SKELETON No focal hypermetabolic activity to suggest skeletal metastasis. IMPRESSION: 1. Pulmonary nodules are stable to minimally increased in size when compared with CT dated 03/25/2016. 2. There is malignant range FDG uptake associated with sub  carinal lymph node which is new from PET-CT dated 05/17/2014. There is also mild increased radiotracer uptake associated with right internal mammary lymph node which measures 9 mm. 3. Stable appearance of borderline enlarged upper abdominal lymph nodes. 4. Cirrhosis and splenomegaly. Electronically Signed   By: Kerby Moors M.D.   On: 04/24/2016 15:53   ASSESSMENT AND PLAN: This is a very pleasant 70 years old white female recently diagnosed with metastatic non-small cell lung cancer, adenocarcinoma with negative EGFR mutation and negative ALK gene translocation. This was initially diagnosed as stage IB status post right upper lobectomy but the patient had evidence for disease recurrence in the right axilla status post resection. She is also status post 6 cycles of systemic chemotherapy with carboplatin and Alimta. The patient tolerated her treatment fairly well with no significant adverse effects. The patient has been observation and the recent CT scan of the chest showed continued enlargement of bilateral pulmonary nodules concerning for metastatic  disease to the lung.  The recent PET scan showed stable pulmonary nodules but increased FDG uptake in the subcarinal lymph node which is new compared to the previous PET scan. There is also mild increase uptake in the right internal mammary lymph node. I discussed the scan results and showed the images to the patient today. I recommended for her to see Dr. Sondra Come for consideration of palliative radiotherapy to the hypermetabolic subcarinal and right internal mammary lymph node. I will see her back for follow-up visit in 4 months for reevaluation with repeat CT scan of the chest, abdomen and pelvis for restaging of her disease. She was also advised to call me immediately if she develop any jaundice or had any other significant complaints. The patient voices understanding of current disease status and treatment options and is in agreement with the current care  plan.  All questions were answered. The patient knows to call the clinic with any problems, questions or concerns. We can certainly see the patient much sooner if necessary.  Disclaimer: This note was dictated with voice recognition software. Similar sounding words can inadvertently be transcribed and may not be corrected upon review.

## 2016-05-02 ENCOUNTER — Telehealth: Payer: Self-pay | Admitting: *Deleted

## 2016-05-02 ENCOUNTER — Telehealth: Payer: Self-pay | Admitting: Oncology

## 2016-05-02 NOTE — Telephone Encounter (Signed)
Oncology Nurse Navigator Documentation  Oncology Nurse Navigator Flowsheets 05/02/2016  Navigator Location [facility]  Navigator Encounter Type Telephone/Ms. Kube called and left me a vm message to call.  I called and spoke with her.  She asked if she could get the flu shot.  I asked Dr. Julien Nordmann.  He stated yes, ok to get flu shot.  I updated patient.  She was thankful for the call back.   Telephone Outgoing Call  Treatment Phase Follow-up  Barriers/Navigation Needs Education  Education Other  Interventions Education Method  Education Method Verbal  Acuity Level 1  Acuity Level 1 Minimal follow up required  Time Spent with Patient 30

## 2016-05-02 NOTE — Telephone Encounter (Signed)
Nancy Hodges called and asked if it would be OK to get a flu shot before she starts radiation.  Advised her from a radiation standpoint, she is OK to get a flu shot but she should check with Dr. Worthy Flank office as well.  Nancy Hodges verbalized understanding and agreement.

## 2016-05-04 ENCOUNTER — Telehealth: Payer: Self-pay | Admitting: Internal Medicine

## 2016-05-04 NOTE — Telephone Encounter (Signed)
Called and s/w pt, advised appts for lab 08/26/16 @ 10am and md 09/02/16 @ 1.30pm. Advised radiology will call to schedule scans and will need to pick up comtrast. Pt verbalized understanding. Also mailed pt calendar.

## 2016-05-05 NOTE — Progress Notes (Signed)
Thoracic Location of Tumor / Histology: Metastatic non-small cell lung cancer, adenocarcinoma, has evidence for disease recurrence in the right axilla status post resection - shown on PET scan from 04/24/16 "There is malignant range FDG uptake associated with sub carinal lymph node which is new from PET-CT dated 05/17/2014. There is also mild increased radiotracer uptake associated with right internal mammary lymph node which measures 9 mm."  Patient presented with symptoms of:   Biopsies revealed:   05/01/14 Diagnosis Lymph node, biopsy, Right Axillary mass - ONE LYMPH NODE, POSITIVE FOR METASTATIC ADENOCARCINOMA (1/1), SEE COMMENT.  Tobacco/Marijuana/Snuff/ETOH use: smoked 2 ppd for 40 years, quit in 1997, denies ETOH, marijuana, snuff use.  Past/Anticipated interventions by cardiothoracic surgery, if any: 10/27/12 - Procedure: VIDEO ASSISTED THORACOSCOPY (VATS),RGHT UPPER LOBE LUNG WEDGE RESECTION;  Surgeon: [name], MD;  Location: [facility];  Service: Thoracic;  Laterality: Right;  Past/Anticipated interventions by medical oncology, if any:  Systemic chemotherapy with carboplatin for AUC of 5 and Alimta 500 mg/M2 every 3 weeks. First cycle 05/31/2014. Status post 6 cycles. Last dose was given 09/22/2014.  Signs/Symptoms  Weight changes, if any:no  Respiratory complaints, if any: yes - reports having some shortness of breath  Hemoptysis, if any: no  Pain issues, if any:  no  SAFETY ISSUES:  Prior radiation? no  Pacemaker/ICD? yes - has a leadless pacemaker  Possible current pregnancy?no  Is the patient on methotrexate? no  Current Complaints / other details:  Dr. Julien Nordmann is recommending palliative radiotherapy to the hypermetabolic subcarinal and right internal mammary lymph node.  Patient would like to be treated in [place].  BP (!) 155/56 (BP Location: Left Arm, Patient Position: Sitting)   Pulse 75   Temp 98.4 F (36.9 C) (Oral)   Ht '5\' 2"'$  (1.575 m)   Wt 153 lb  6.4 oz (69.6 kg)   SpO2 99%   BMI 28.06 kg/m    Wt Readings from Last 3 Encounters:  05/07/16 153 lb 6.4 oz (69.6 kg)  04/28/16 152 lb 3.2 oz (69 kg)  04/09/16 154 lb 12.8 oz (70.2 kg)

## 2016-05-07 ENCOUNTER — Ambulatory Visit
Admission: RE | Admit: 2016-05-07 | Discharge: 2016-05-07 | Disposition: A | Discharge status: Home / Self Care | Payer: Medicare Other | Source: Ambulatory Visit | Attending: Radiation Oncology | Admitting: Radiation Oncology

## 2016-05-07 ENCOUNTER — Encounter: Payer: Self-pay | Admitting: Radiation Oncology

## 2016-05-07 VITALS — BP 155/56 | HR 75 | Temp 98.4°F | Ht 62.0 in | Wt 153.4 lb

## 2016-05-07 DIAGNOSIS — Z8249 Family history of ischemic heart disease and other diseases of the circulatory system: Secondary | ICD-10-CM | POA: Diagnosis not present

## 2016-05-07 DIAGNOSIS — F419 Anxiety disorder, unspecified: Secondary | ICD-10-CM | POA: Diagnosis not present

## 2016-05-07 DIAGNOSIS — Z800 Family history of malignant neoplasm of digestive organs: Secondary | ICD-10-CM | POA: Insufficient documentation

## 2016-05-07 DIAGNOSIS — C3490 Malignant neoplasm of unspecified part of unspecified bronchus or lung: Secondary | ICD-10-CM | POA: Diagnosis not present

## 2016-05-07 DIAGNOSIS — I4892 Unspecified atrial flutter: Secondary | ICD-10-CM | POA: Diagnosis not present

## 2016-05-07 DIAGNOSIS — J45909 Unspecified asthma, uncomplicated: Secondary | ICD-10-CM | POA: Insufficient documentation

## 2016-05-07 DIAGNOSIS — C3491 Malignant neoplasm of unspecified part of right bronchus or lung: Secondary | ICD-10-CM

## 2016-05-07 DIAGNOSIS — Z8051 Family history of malignant neoplasm of kidney: Secondary | ICD-10-CM | POA: Insufficient documentation

## 2016-05-07 DIAGNOSIS — E119 Type 2 diabetes mellitus without complications: Secondary | ICD-10-CM | POA: Insufficient documentation

## 2016-05-07 DIAGNOSIS — F17210 Nicotine dependence, cigarettes, uncomplicated: Secondary | ICD-10-CM | POA: Insufficient documentation

## 2016-05-07 DIAGNOSIS — Z9889 Other specified postprocedural states: Secondary | ICD-10-CM | POA: Diagnosis not present

## 2016-05-07 DIAGNOSIS — Z833 Family history of diabetes mellitus: Secondary | ICD-10-CM | POA: Insufficient documentation

## 2016-05-07 DIAGNOSIS — K219 Gastro-esophageal reflux disease without esophagitis: Secondary | ICD-10-CM | POA: Diagnosis not present

## 2016-05-07 DIAGNOSIS — K449 Diaphragmatic hernia without obstruction or gangrene: Secondary | ICD-10-CM | POA: Insufficient documentation

## 2016-05-07 DIAGNOSIS — Z90710 Acquired absence of both cervix and uterus: Secondary | ICD-10-CM | POA: Diagnosis not present

## 2016-05-07 DIAGNOSIS — R590 Localized enlarged lymph nodes: Secondary | ICD-10-CM | POA: Diagnosis not present

## 2016-05-07 DIAGNOSIS — Z7901 Long term (current) use of anticoagulants: Secondary | ICD-10-CM | POA: Insufficient documentation

## 2016-05-07 DIAGNOSIS — Z87442 Personal history of urinary calculi: Secondary | ICD-10-CM | POA: Diagnosis not present

## 2016-05-07 DIAGNOSIS — Z9221 Personal history of antineoplastic chemotherapy: Secondary | ICD-10-CM | POA: Diagnosis not present

## 2016-05-07 DIAGNOSIS — Z79899 Other long term (current) drug therapy: Secondary | ICD-10-CM | POA: Insufficient documentation

## 2016-05-07 DIAGNOSIS — R911 Solitary pulmonary nodule: Secondary | ICD-10-CM | POA: Insufficient documentation

## 2016-05-07 DIAGNOSIS — Z950 Presence of cardiac pacemaker: Secondary | ICD-10-CM | POA: Diagnosis not present

## 2016-05-07 DIAGNOSIS — Z801 Family history of malignant neoplasm of trachea, bronchus and lung: Secondary | ICD-10-CM | POA: Insufficient documentation

## 2016-05-07 DIAGNOSIS — Z888 Allergy status to other drugs, medicaments and biological substances status: Secondary | ICD-10-CM | POA: Insufficient documentation

## 2016-05-07 DIAGNOSIS — I480 Paroxysmal atrial fibrillation: Secondary | ICD-10-CM | POA: Diagnosis not present

## 2016-05-07 DIAGNOSIS — Z880 Allergy status to penicillin: Secondary | ICD-10-CM | POA: Insufficient documentation

## 2016-05-07 DIAGNOSIS — Z902 Acquired absence of lung [part of]: Secondary | ICD-10-CM | POA: Insufficient documentation

## 2016-05-07 DIAGNOSIS — Z825 Family history of asthma and other chronic lower respiratory diseases: Secondary | ICD-10-CM | POA: Diagnosis not present

## 2016-05-07 NOTE — Progress Notes (Signed)
Radiation Oncology         ([phone]) [phone] ________________________________  Initial Outpatient Consultation  Name: Nancy Hodges MRN: [id number]  Date: 05/07/2016  DOB: 01/06/1946  JA:[id number], Lennon Alstrom, PA-C  Curt Bears, MD   REFERRING PHYSICIAN: Curt Bears, MD  DIAGNOSIS: Metastatic non-small cell lung cancer, adenocarcinoma. This was initially diagnosed as stage IB (T2a, N0, M0) In March of 2014, status post right upper lobectomy with lymph node dissection but then patient developed recurrence in the right axilla status post resection.  HISTORY OF PRESENT ILLNESS::Nancy Hodges is a 70 y.o. female who has evidence for disease recurrence in the right axilla post resection. A recent PET scan on 04/24/16 revealed "there is malignant range FDG uptake associated with sub carinal lymph node which is new from PET-CT dated 05/17/14. There is also mild increased radiotracer uptake associated with right internal mammary lymph node which measures 9 mm."  Patient underwent video assisted thoracoscopy (vats), right upper lobe lung wedge resection on 10/27/12. This was performed by Dr. Roxan Hockey.  A lymph node biopsy of the right axillary mass performed on 05/01/14 revealed one lymph node positive for metastatic adenocarcinoma (1/1).  Patient participated in systemic chemotherapy with carboplatin for AUC of 5 and Alimta 500 mg/M2 every 3 weeks. First cycle occurred on 05/31/14. Status post 6 cycles. Last dose was given on 09/22/14.  Patient denies weight changes, hemoptysis, or pain at this time. She notes some shortness of breath.  PREVIOUS RADIATION THERAPY: No  PAST MEDICAL HISTORY:  has a past medical history of Allergic rhinitis; Anxiety; Arthritis; Asthma; Atrial flutter ([place]); Cholelithiases (09/26/2015); Diabetes mellitus without complication ([place]); Diverticulosis; GERD (gastroesophageal reflux disease); H/O hiatal hernia; History of kidney stones; Internal hemorrhoid; Lung  cancer ([place]); Paroxysmal atrial fibrillation ([place]); Pulmonary nodule; and Tachycardia-bradycardia syndrome ([place]).    PAST SURGICAL HISTORY: Past Surgical History:  Procedure Laterality Date  . COLONOSCOPY     [place]: cattered sigmoid diverticula, internal hemorrhoids, sessile polyp in ascending and mid transverse colon (tubular adenoma).   . colonoscopy with polypectomy  2015  . ESOPHAGOGASTRODUODENOSCOPY     [place]: bile reflux in stomach, mild gastritis, hiatal hernia  . ESOPHAGOGASTRODUODENOSCOPY N/A 01/31/2016   Procedure: ESOPHAGOGASTRODUODENOSCOPY (EGD);  Surgeon: [name], MD;  Location[facility];  Service: Endoscopy;  Laterality: N/A;  1200  . LOBECTOMY Right 10/27/2012   Procedure: LOBECTOMY;  Surgeon: [name], MD;  Location: [facility];  Service: Thoracic;  Laterality: Right;   RIGHT UPPER LOBECTOMY & Node Dissection  . LYMPH NODE BIOPSY Right 05/01/2014   Procedure: LYMPH NODE BIOPSY, Right Axillary;  Surgeon: [name], MD;  Location: [facility];  Service: Thoracic;  Laterality: Right;  . PARTIAL HYSTERECTOMY    . PERMANENT PACEMAKER INSERTION N/A 09/14/2012   Nanostim (SJM) leadless pacemaker (LEADLESS II STUDY PATIENT) implanted by Dr [name]  . VIDEO ASSISTED THORACOSCOPY (VATS)/WEDGE RESECTION Right 10/27/2012   Procedure: VIDEO ASSISTED THORACOSCOPY (VATS),RGHT UPPER LOBE LUNG WEDGE RESECTION;  Surgeon: [name], MD;  Location: [facility];  Service: Thoracic;  Laterality: Right;    FAMILY HISTORY: family history includes Asthma in her father; Diabetes in her brother, mother, and sister; Heart attack in her son; Heart disease in her father and mother; Kidney cancer in her mother; Lung cancer in her sister; Pancreatic cancer in her brother.  SOCIAL HISTORY:  reports that she quit smoking about 20 years ago. Her smoking use included Cigarettes. She has a 80.00 pack-year smoking history. She has  never used smokeless tobacco. She reports that she  does not drink alcohol or use drugs.  ALLERGIES: Prednisone; Septra [sulfamethoxazole-trimethoprim]; Penicillins; and Vioxx [rofecoxib]  MEDICATIONS:  Current Outpatient Prescriptions  Medication Sig Dispense Refill  . acetaminophen (TYLENOL) 500 MG tablet Take 500 mg by mouth every 6 (six) hours as needed for moderate pain or fever.     [name] albuterol (PROVENTIL HFA;VENTOLIN HFA) 108 (90 BASE) MCG/ACT inhaler Inhale 1-2 puffs into the lungs every 6 (six) hours as needed for wheezing or shortness of breath.    [name] albuterol (PROVENTIL) (2.5 MG/3ML) 0.083% nebulizer solution Take 3 mLs (2.5 mg total) by nebulization every 2 (two) hours as needed for shortness of breath. 120 mL 0  . diltiazem (CARDIZEM CD) 240 MG 24 hr capsule Take 1 capsule (240 mg total) by mouth daily. 30 capsule 11  . fexofenadine (ALLEGRA) 180 MG tablet Take 180 mg by mouth daily.    . fluticasone (FLONASE) 50 MCG/ACT nasal spray Place 2 sprays into both nostrils daily as needed for allergies or rhinitis. Reported on 02/19/2016    . hepatitis A-hepatitis B (TWINRIX) 720-20 ELU-MCG/ML injection Inject 1 mL into the muscle once.    [name] ipratropium-albuterol (DUONEB) 0.5-2.5 (3) MG/3ML SOLN Take 3 mLs by nebulization every 6 (six) hours as needed (shortness of breath or wheezing).     . meclizine (ANTIVERT) 25 MG tablet Take 25 mg by mouth 3 (three) times daily as needed for dizziness or nausea.    . montelukast (SINGULAIR) 10 MG tablet Take 10 mg by mouth at bedtime.   5  . Olopatadine HCl (PAZEO) 0.7 % SOLN Place 1 drop into both eyes 1 day or 1 dose. 1 Bottle 5  . rivaroxaban (XARELTO) 20 MG TABS tablet Take 1 tablet (20 mg total) by mouth daily with supper. 30 tablet 11   No current facility-administered medications for this encounter.     REVIEW OF SYSTEMS:  A 15 point review of systems is documented in the electronic medical record. This was obtained by the nursing staff. However, I reviewed this with the patient to discuss  relevant findings and make appropriate changes.  She denies any pain within the chest area difficulty with swallowing or pain with swallowing. She denies any significant cough or hemoptysis, the patient admits to blurred vision related to cataracts. She denies any headaches or double vision   PHYSICAL EXAM:  height is '5\' 2"'$  (1.575 m) and weight is 153 lb 6.4 oz (69.6 kg). Her oral temperature is 98.4 F (36.9 C). Her blood pressure is 155/56 (abnormal) and her pulse is 75. Her oxygen saturation is 99%.    General: Alert and oriented, in no acute distress, accompanied by her sister on evaluation today HEENT: Head is normocephalic. Extraocular movements are intact. Oropharynx is clear. Dentures on maxillary region. Poor dentitian with several teeth missing. Neck: Neck is supple, no palpable cervical or supraclavicular lymphadenopathy. Heart: Regular in rate and rhythm with no murmurs, rubs, or gallops. Chest: Clear to auscultation bilaterally, with no rhonchi, wheezes, or rales. Scar along right lateral chest. Small scar on axillary region from axillary node dissection. Abdomen: Soft, nontender, nondistended, with no rigidity or guarding. Extremities: No cyanosis or edema. Lymphatics: see Neck Exam Skin: No concerning lesions. Musculoskeletal: symmetric strength and muscle tone throughout. Neurologic: Cranial nerves II through XII are grossly intact. No obvious focalities. Speech is fluent. Coordination is intact. Psychiatric: Judgment and insight are intact. Affect is appropriate.  ECOG = 1  LABORATORY DATA:  Lab Results  Component Value Date   WBC 4.4 03/25/2016   HGB 11.7 03/25/2016   HCT 34.1 (L) 03/25/2016   MCV 97.2 03/25/2016   PLT 160 03/25/2016   NEUTROABS 2.7 03/25/2016   Lab Results  Component Value Date   NA 140 03/25/2016   K 4.8 03/25/2016   CL 106 08/06/2015   CO2 28 03/25/2016   GLUCOSE 122 03/25/2016   CREATININE 0.8 03/25/2016   CALCIUM 9.9 03/25/2016       RADIOGRAPHY: Nm Pet Image Restag (ps) Skull Base To Thigh  Result Date: 04/24/2016 CLINICAL DATA:  Restaging treatment strategy for non-small cell lung cancer. EXAM: NUCLEAR MEDICINE PET SKULL BASE TO THIGH TECHNIQUE: 7.5 mCi F-18 FDG was injected intravenously. Full-ring PET imaging was performed from the skull base to thigh after the radiotracer. CT data was obtained and used for attenuation correction and anatomic localization. FASTING BLOOD GLUCOSE:  Value: 133 mg/dl COMPARISON:  PET-CT 05/17/2014 and CT from 05/17/2014 FINDINGS: NECK No hypermetabolic lymph nodes in the neck. CHEST New hypermetabolic sub- carinal lymph node has an SUV max equal to 8.35. Right internal mammary lymph node measures 9 mm and has an SUV max equal to 4.07. On the previous exam this measured 9 mm and had an SUV max equal to 2.7. Mild changes of centrilobular emphysema. Pulmonary nodule in the left lower lobe measures 7 mm. This is compared with 6 mm previously and is too small to reliably characterize by PET-CT, image 31 of series 8. 6 mm left lower lobe lung nodules also too small to characterize by PET-CT, image 37 of series 8. Previously 4 mm. Right lung base nodule measures 7 mm, image 55 of series 8. Stable from previous exam. ABDOMEN/PELVIS Calcified granulomas identified within the liver. No suspicious liver abnormality. Morphologic features of cirrhosis are again noted. Gallstones are again identified. No biliary dilatation. The spleen is enlarged. Stable 9 mm low-attenuation structure within the spleen, image 105 of series 4. No abnormal areas of increased uptake within the spleen. Gallstones noted. No hypermetabolic lymph nodes in the abdomen or pelvis. Stable 10 mm periportal lymph nodes, image number 109 of series 4. No significant FDG uptake associated with this node. 1.1 cm portacaval node is identified, image 106 of series 4. SUV max associated this node is equal to 6.07. This is not significantly changed  from CT dated 03/25/2016. Aortocaval node measures 7 mm and has an SUV max equal to the 3.5. This is unchanged in size from 03/25/2016. SKELETON No focal hypermetabolic activity to suggest skeletal metastasis. IMPRESSION: 1. Pulmonary nodules are stable to minimally increased in size when compared with CT dated 03/25/2016. 2. There is malignant range FDG uptake associated with sub carinal lymph node which is new from PET-CT dated 05/17/2014. There is also mild increased radiotracer uptake associated with right internal mammary lymph node which measures 9 mm. 3. Stable appearance of borderline enlarged upper abdominal lymph nodes. 4. Cirrhosis and splenomegaly. Electronically Signed   By: Kerby Moors M.D.   On: 04/24/2016 15:53      IMPRESSION: Metastatic non-small cell lung cancer, adenocarcinoma. This was initially diagnosed as stage IB (T2a, N0, M0) In March of 2014, status post right upper lobectomy with lymph node dissection but the patient has evidence for disease recurrence in the right axilla status post resection. Her only site of active disease at this time is within subcarinal lymph node region and right internal mammary lymph node  on PET scan. Given this only area of involvement, the Patient would be a good candidate for aggressive palliative radiation treatment to the subcarinal lymph node and internal mammary lymph node. Discussed course of treatment, side effects, and toxicities of treatment. She wishes to proceed with treatment. Due to transportation issues she can not come to [place] but would be able to make it to [place] on a daily basis.  PLAN:  We will scheudle her for treatment at [facility] and anticipate 4-6 weeks of radiation therapy.  We will have to consult with cardiologist concerning her internal pacemaker.    ------------------------------------------------  Blair Promise, PhD, MD  This document serves as a record of services personally performed by Gery Pray, MD. It was  created on his behalf by Bethann Humble, a trained medical scribe. The creation of this record is based on the scribe's personal observations and the provider's statements to them. This document has been checked and approved by the attending provider.

## 2016-05-07 NOTE — Progress Notes (Signed)
Please see the Nurse Progress Note in the MD Initial Consult Encounter for this patient. 

## 2016-05-14 ENCOUNTER — Telehealth: Payer: Self-pay | Admitting: Oncology

## 2016-05-14 ENCOUNTER — Telehealth: Payer: Self-pay | Admitting: Internal Medicine

## 2016-05-14 NOTE — Telephone Encounter (Signed)
Pamala Hurry from [facility] called back and said that Dr. Jackalyn Lombard nurse will be faxing the pacemaker form back to [place] today.  Thanked her for looking for the form.

## 2016-05-14 NOTE — Telephone Encounter (Signed)
New message       Pt is scheduled to have radiation.  Calling to see if you received the clearance form because pt has a device.  Please call and give them the status on completing the form

## 2016-05-14 NOTE — Telephone Encounter (Signed)
Nancy Hodges regarding pacemaker form faxed to Dr. Jackalyn Lombard office on 05/08/16.  They are checking to see where the form is and will call us back.

## 2016-05-14 NOTE — Telephone Encounter (Signed)
LMOVM for Santiago Glad to call back.

## 2016-05-14 NOTE — Telephone Encounter (Signed)
Spoke w/ Santiago Glad at [facility] informed her the MD nurse was going to fax clearance for radiation.

## 2016-06-04 DIAGNOSIS — J189 Pneumonia, unspecified organism: Secondary | ICD-10-CM

## 2016-06-04 HISTORY — DX: Pneumonia, unspecified organism: J18.9

## 2016-06-09 NOTE — Patient Instructions (Signed)
Your procedure is scheduled on: 06/16/2016  Report to [facility] [address] at  650   AM.  Call this number if you have problems the morning of surgery: [phone]   Do not eat food or drink liquids :After Midnight.      Take these medicines the morning of surgery with A SIP OF WATER: cardiazem, alegra, antivert, singulair, protonix.   Do not wear jewelry, make-up or nail polish.  Do not wear lotions, powders, or perfumes. You may wear deodorant.  Do not shave 48 hours prior to surgery.  Do not bring valuables to the hospital.  Contacts, dentures or bridgework may not be worn into surgery.  Leave suitcase in the car. After surgery it may be brought to your room.  For patients admitted to the hospital, checkout time is 11:00 AM the day of discharge.   Patients discharged the day of surgery will not be allowed to drive home.  :     Please read over the following fact sheets that you were given: Coughing and Deep Breathing, Surgical Site Infection Prevention, Anesthesia Post-op Instructions and Care and Recovery After Surgery    Cataract A cataract is a clouding of the lens of the eye. When a lens becomes cloudy, vision is reduced based on the degree and nature of the clouding. Many cataracts reduce vision to some degree. Some cataracts make people more near-sighted as they develop. Other cataracts increase glare. Cataracts that are ignored and become worse can sometimes look white. The white color can be seen through the pupil. CAUSES   Aging. However, cataracts may occur at any age, even in newborns.   Certain drugs.   Trauma to the eye.   Certain diseases such as diabetes.   Specific eye diseases such as chronic inflammation inside the eye or a sudden attack of a rare form of glaucoma.   Inherited or acquired medical problems.  SYMPTOMS   Gradual, progressive drop in vision in the affected eye.   Severe, rapid visual loss. This most often happens when trauma is the cause.  DIAGNOSIS    To detect a cataract, an eye doctor examines the lens. Cataracts are best diagnosed with an exam of the eyes with the pupils enlarged (dilated) by drops.  TREATMENT  For an early cataract, vision may improve by using different eyeglasses or stronger lighting. If that does not help your vision, surgery is the only effective treatment. A cataract needs to be surgically removed when vision loss interferes with your everyday activities, such as driving, reading, or watching TV. A cataract may also have to be removed if it prevents examination or treatment of another eye problem. Surgery removes the cloudy lens and usually replaces it with a substitute lens (intraocular lens, IOL).  At a time when both you and your doctor agree, the cataract will be surgically removed. If you have cataracts in both eyes, only one is usually removed at a time. This allows the operated eye to heal and be out of danger from any possible problems after surgery (such as infection or poor wound healing). In rare cases, a cataract may be doing damage to your eye. In these cases, your caregiver may advise surgical removal right away. The vast majority of people who have cataract surgery have better vision afterward. HOME CARE INSTRUCTIONS  If you are not planning surgery, you may be asked to do the following:  Use different eyeglasses.   Use stronger or brighter lighting.   Ask your eye doctor  about reducing your medicine dose or changing medicines if it is thought that a medicine caused your cataract. Changing medicines does not make the cataract go away on its own.   Become familiar with your surroundings. Poor vision can lead to injury. Avoid bumping into things on the affected side. You are at a higher risk for tripping or falling.   Exercise extreme care when driving or operating machinery.   Wear sunglasses if you are sensitive to bright light or experiencing problems with glare.  SEEK IMMEDIATE MEDICAL CARE IF:   You  have a worsening or sudden vision loss.   You notice redness, swelling, or increasing pain in the eye.   You have a fever.  Document Released: 07/21/2005 Document Revised: 07/10/2011 Document Reviewed: 03/14/2011 [facility] Patient Information 2012 [place].PATIENT INSTRUCTIONS POST-ANESTHESIA  IMMEDIATELY FOLLOWING SURGERY:  Do not drive or operate machinery for the first twenty four hours after surgery.  Do not make any important decisions for twenty four hours after surgery or while taking narcotic pain medications or sedatives.  If you develop intractable nausea and vomiting or a severe headache please notify your doctor immediately.  FOLLOW-UP:  Please make an appointment with your surgeon as instructed. You do not need to follow up with anesthesia unless specifically instructed to do so.  WOUND CARE INSTRUCTIONS (if applicable):  Keep a dry clean dressing on the anesthesia/puncture wound site if there is drainage.  Once the wound has quit draining you may leave it open to air.  Generally you should leave the bandage intact for twenty four hours unless there is drainage.  If the epidural site drains for more than 36-48 hours please call the anesthesia department.  QUESTIONS?:  Please feel free to call your physician or the hospital operator if you have any questions, and they will be happy to assist you.

## 2016-06-10 ENCOUNTER — Encounter (HOSPITAL_COMMUNITY)
Admission: RE | Admit: 2016-06-10 | Discharge: 2016-06-10 | Disposition: A | Discharge status: Home / Self Care | Payer: Medicare Other | Source: Ambulatory Visit | Attending: Ophthalmology | Admitting: Ophthalmology
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
## 2016-06-16 ENCOUNTER — Ambulatory Visit (HOSPITAL_COMMUNITY): Admission: RE | Admit: 2016-06-16 | Payer: Medicare Other | Source: Ambulatory Visit | Admitting: Ophthalmology

## 2016-06-16 ENCOUNTER — Encounter (HOSPITAL_COMMUNITY): Admission: RE | Payer: Self-pay | Source: Ambulatory Visit

## 2016-06-16 SURGERY — PHACOEMULSIFICATION, CATARACT, WITH IOL INSERTION
Anesthesia: Monitor Anesthesia Care | Site: Eye | Laterality: Right

## 2016-07-14 ENCOUNTER — Ambulatory Visit: Payer: Medicare Other | Admitting: Gastroenterology

## 2016-08-07 NOTE — Patient Instructions (Signed)
Your procedure is scheduled on:08/14/2016                 Report to [facility] at    9:00 AM.  Call this number if you have problems the morning of surgery: [phone]   Remember:   Do not eat or drink :After Midnight.    Take these medicines the morning of surgery with A SIP OF WATER:         Diltiazem and use inhalers Duoneb, Pulmicort and albuterol   Do not wear jewelry, make-up or nail polish.  Do not wear lotions, powders, or perfumes. You may wear deodorant.  Do not bring valuables to the hospital.  Contacts, dentures or bridgework may not be worn into surgery.  Patients discharged the day of surgery will not be allowed to drive home.  Name and phone number of your driver:    '@[email or web address]'$ @ Cataract Surgery  A cataract is a clouding of the lens of the eye. When a lens becomes cloudy, vision is reduced based on the degree and nature of the clouding. Surgery may be needed to improve vision. Surgery removes the cloudy lens and usually replaces it with a substitute lens (intraocular lens, IOL). LET YOUR EYE DOCTOR KNOW ABOUT:  Allergies to food or medicine.   Medicines taken including herbs, eyedrops, over-the-counter medicines, and creams.   Use of steroids (by mouth or creams).   Previous problems with anesthetics or numbing medicine.   History of bleeding problems or blood clots.   Previous surgery.   Other health problems, including diabetes and kidney problems.   Possibility of pregnancy, if this applies.  RISKS AND COMPLICATIONS  Infection.   Inflammation of the eyeball (endophthalmitis) that can spread to both eyes (sympathetic ophthalmia).   Poor wound healing.   If an IOL is inserted, it can later fall out of proper position. This is very uncommon.   Clouding of the part of your eye that holds an IOL in place. This is called an "after-cataract." These are uncommon, but easily treated.  BEFORE THE PROCEDURE  Do not eat or drink anything except small  amounts of water for 8 to 12 before your surgery, or as directed by your caregiver.   Unless you are told otherwise, continue any eyedrops you have been prescribed.   Talk to your primary caregiver about all other medicines that you take (both prescription and non-prescription). In some cases, you may need to stop or change medicines near the time of your surgery. This is most important if you are taking blood-thinning medicine.Do not stop medicines unless you are told to do so.   Arrange for someone to drive you to and from the procedure.   Do not put contact lenses in either eye on the day of your surgery.  PROCEDURE There is more than one method for safely removing a cataract. Your doctor can explain the differences and help determine which is best for you. Phacoemulsification surgery is the most common form of cataract surgery.  An injection is given behind the eye or eyedrops are given to make this a painless procedure.   A small cut (incision) is made on the edge of the clear, dome-shaped surface that covers the front of the eye (cornea).   A tiny probe is painlessly inserted into the eye. This device gives off ultrasound waves that soften and break up the cloudy center of the lens. This makes it easier for the cloudy lens to be removed by suction.  An IOL may be implanted.   The normal lens of the eye is covered by a clear capsule. Part of that capsule is intentionally left in the eye to support the IOL.   Your surgeon may or may not use stitches to close the incision.  There are other forms of cataract surgery that require a larger incision and stiches to close the eye. This approach is taken in cases where the doctor feels that the cataract cannot be easily removed using phacoemulsification. AFTER THE PROCEDURE  When an IOL is implanted, it does not need care. It becomes a permanent part of your eye and cannot be seen or felt.   Your doctor will schedule follow-up exams to  check on your progress.   Review your other medicines with your doctor to see which can be resumed after surgery.   Use eyedrops or take medicine as prescribed by your doctor.  Document Released: 07/10/2011 Document Reviewed: 07/07/2011 [facility] Patient Information 2012 [place].  .Cataract Surgery Care After Refer to this sheet in the next few weeks. These instructions provide you with information on caring for yourself after your procedure. Your caregiver may also give you more specific instructions. Your treatment has been planned according to current medical practices, but problems sometimes occur. Call your caregiver if you have any problems or questions after your procedure.  HOME CARE INSTRUCTIONS   Avoid strenuous activities as directed by your caregiver.   Ask your caregiver when you can resume driving.   Use eyedrops or other medicines to help healing and control pressure inside your eye as directed by your caregiver.   Only take over-the-counter or prescription medicines for pain, discomfort, or fever as directed by your caregiver.   Do not to touch or rub your eyes.   You may be instructed to use a protective shield during the first few days and nights after surgery. If not, wear sunglasses to protect your eyes. This is to protect the eye from pressure or from being accidentally bumped.   Keep the area around your eye clean and dry. Avoid swimming or allowing water to hit you directly in the face while showering. Keep soap and shampoo out of your eyes.   Do not bend or lift heavy objects. Bending increases pressure in the eye. You can walk, climb stairs, and do light household chores.   Do not put a contact lens into the eye that had surgery until your caregiver says it is okay to do so.   Ask your doctor when you can return to work. This will depend on the kind of work that you do. If you work in a dusty environment, you may be advised to wear protective eyewear for  a period of time.   Ask your caregiver when it will be safe to engage in sexual activity.   Continue with your regular eye exams as directed by your caregiver.  What to expect:  It is normal to feel itching and mild discomfort for a few days after cataract surgery. Some fluid discharge is also common, and your eye may be sensitive to light and touch.   After 1 to 2 days, even moderate discomfort should disappear. In most cases, healing will take about 6 weeks.   If you received an intraocular lens (IOL), you may notice that colors are very bright or have a blue tinge. Also, if you have been in bright sunlight, everything may appear reddish for a few hours. If you see these color  tinges, it is because your lens is clear and no longer cloudy. Within a few months after receiving an IOL, these extra colors should go away. When you have healed, you will probably need new glasses.  SEEK MEDICAL CARE IF:   You have increased bruising around your eye.   You have discomfort not helped by medicine.  SEEK IMMEDIATE MEDICAL CARE IF:   You have a fever.   You have a worsening or sudden vision loss.   You have redness, swelling, or increasing pain in the eye.   You have a thick discharge from the eye that had surgery.  MAKE SURE YOU:  Understand these instructions.   Will watch your condition.   Will get help right away if you are not doing well or get worse.  Document Released: 02/07/2005 Document Revised: 07/10/2011 Document Reviewed: 03/14/2011 [facility] Patient Information 2012 [place].    Monitored Anesthesia Care  Monitored anesthesia care is an anesthesia service for a medical procedure. Anesthesia is the loss of the ability to feel pain. It is produced by medications called anesthetics. It may affect a small area of your body (local anesthesia), a large area of your body (regional anesthesia), or your entire body (general anesthesia). The need for monitored anesthesia care  depends your procedure, your condition, and the potential need for regional or general anesthesia. It is often provided during procedures where:   General anesthesia may be needed if there are complications. This is because you need special care when you are under general anesthesia.   You will be under local or regional anesthesia. This is so that you are able to have higher levels of anesthesia if needed.   You will receive calming medications (sedatives). This is especially the case if sedatives are given to put you in a semi-conscious state of relaxation (deep sedation). This is because the amount of sedative needed to produce this state can be hard to predict. Too much of a sedative can produce general anesthesia. Monitored anesthesia care is performed by one or more caregivers who have special training in all types of anesthesia. You will need to meet with these caregivers before your procedure. During this meeting, they will ask you about your medical history. They will also give you instructions to follow. (For example, you will need to stop eating and drinking before your procedure. You may also need to stop or change medications you are taking.) During your procedure, your caregivers will stay with you. They will:   Watch your condition. This includes watching you blood pressure, breathing, and level of pain.   Diagnose and treat problems that occur.   Give medications if they are needed. These may include calming medications (sedatives) and anesthetics.   Make sure you are comfortable.  Having monitored anesthesia care does not necessarily mean that you will be under anesthesia. It does mean that your caregivers will be able to manage anesthesia if you need it or if it occurs. It also means that you will be able to have a different type of anesthesia than you are having if you need it. When your procedure is complete, your caregivers will continue to watch your condition. They will  make sure any medications wear off before you are allowed to go home.  Document Released: 04/16/2005 Document Revised: 11/15/2012 Document Reviewed: 09/01/2012 [facility] Patient Information 2014 [place], [place].

## 2016-08-11 ENCOUNTER — Encounter (HOSPITAL_COMMUNITY)
Admission: RE | Admit: 2016-08-11 | Discharge: 2016-08-11 | Disposition: A | Discharge status: Home / Self Care | Payer: Medicare Other | Source: Ambulatory Visit | Attending: Ophthalmology | Admitting: Ophthalmology

## 2016-08-11 ENCOUNTER — Telehealth: Payer: Self-pay | Admitting: *Deleted

## 2016-08-11 NOTE — Telephone Encounter (Signed)
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 I explained to patient that there had been no serious injuries reported in association with device button. I will send patient letter explaining all clinical updates. We will continue to follow patient every 6 months. Patient verbalized understanding.

## 2016-08-12 ENCOUNTER — Encounter (HOSPITAL_COMMUNITY)
Admission: RE | Admit: 2016-08-12 | Discharge: 2016-08-12 | Disposition: A | Discharge status: Home / Self Care | Payer: Medicare Other | Source: Ambulatory Visit | Attending: Ophthalmology | Admitting: Ophthalmology

## 2016-08-12 ENCOUNTER — Other Ambulatory Visit: Payer: Self-pay

## 2016-08-12 ENCOUNTER — Encounter (HOSPITAL_COMMUNITY): Payer: Self-pay

## 2016-08-12 DIAGNOSIS — F419 Anxiety disorder, unspecified: Secondary | ICD-10-CM | POA: Diagnosis not present

## 2016-08-12 DIAGNOSIS — Z87891 Personal history of nicotine dependence: Secondary | ICD-10-CM | POA: Diagnosis not present

## 2016-08-12 DIAGNOSIS — K449 Diaphragmatic hernia without obstruction or gangrene: Secondary | ICD-10-CM | POA: Diagnosis not present

## 2016-08-12 DIAGNOSIS — K219 Gastro-esophageal reflux disease without esophagitis: Secondary | ICD-10-CM | POA: Diagnosis not present

## 2016-08-12 DIAGNOSIS — J449 Chronic obstructive pulmonary disease, unspecified: Secondary | ICD-10-CM | POA: Diagnosis not present

## 2016-08-12 DIAGNOSIS — E1136 Type 2 diabetes mellitus with diabetic cataract: Secondary | ICD-10-CM | POA: Diagnosis present

## 2016-08-12 DIAGNOSIS — K7460 Unspecified cirrhosis of liver: Secondary | ICD-10-CM | POA: Diagnosis not present

## 2016-08-12 LAB — CBC
HCT: 39.1 % (ref 36.0–46.0)
HEMOGLOBIN: 13.6 g/dL (ref 12.0–15.0)
MCH: 33.3 pg (ref 26.0–34.0)
MCHC: 34.8 g/dL (ref 30.0–36.0)
MCV: 95.6 fL (ref 78.0–100.0)
Platelets: 164 10*3/uL (ref 150–400)
RBC: 4.09 MIL/uL (ref 3.87–5.11)
RDW: 13.5 % (ref 11.5–15.5)
WBC: 3.7 10*3/uL — ABNORMAL LOW (ref 4.0–10.5)

## 2016-08-12 LAB — BASIC METABOLIC PANEL
ANION GAP: 7 (ref 5–15)
BUN: 10 mg/dL (ref 6–20)
CALCIUM: 9.6 mg/dL (ref 8.9–10.3)
CO2: 23 mmol/L (ref 22–32)
CREATININE: 0.52 mg/dL (ref 0.44–1.00)
Chloride: 107 mmol/L (ref 101–111)
GFR calc Af Amer: >60 mL/min (ref >60)
GLUCOSE: 119 mg/dL — AB (ref 65–99)
Potassium: 4.3 mmol/L (ref 3.5–5.1)
Sodium: 137 mmol/L (ref 135–145)

## 2016-08-14 ENCOUNTER — Ambulatory Visit (HOSPITAL_COMMUNITY): Payer: Medicare Other | Admitting: Anesthesiology

## 2016-08-14 ENCOUNTER — Encounter (HOSPITAL_COMMUNITY): Payer: Self-pay | Admitting: *Deleted

## 2016-08-14 ENCOUNTER — Encounter (HOSPITAL_COMMUNITY)
Admission: RE | Disposition: A | Discharge status: Home / Self Care | Payer: Self-pay | Source: Ambulatory Visit | Attending: Ophthalmology

## 2016-08-14 ENCOUNTER — Ambulatory Visit (HOSPITAL_COMMUNITY)
Admission: RE | Admit: 2016-08-14 | Discharge: 2016-08-14 | Disposition: A | Discharge status: Home / Self Care | Payer: Medicare Other | Source: Ambulatory Visit | Attending: Ophthalmology | Admitting: Ophthalmology

## 2016-08-14 DIAGNOSIS — K449 Diaphragmatic hernia without obstruction or gangrene: Secondary | ICD-10-CM | POA: Insufficient documentation

## 2016-08-14 DIAGNOSIS — Z87891 Personal history of nicotine dependence: Secondary | ICD-10-CM | POA: Insufficient documentation

## 2016-08-14 DIAGNOSIS — J449 Chronic obstructive pulmonary disease, unspecified: Secondary | ICD-10-CM | POA: Insufficient documentation

## 2016-08-14 DIAGNOSIS — F419 Anxiety disorder, unspecified: Secondary | ICD-10-CM | POA: Insufficient documentation

## 2016-08-14 DIAGNOSIS — K219 Gastro-esophageal reflux disease without esophagitis: Secondary | ICD-10-CM | POA: Insufficient documentation

## 2016-08-14 DIAGNOSIS — E1136 Type 2 diabetes mellitus with diabetic cataract: Secondary | ICD-10-CM | POA: Insufficient documentation

## 2016-08-14 DIAGNOSIS — K7460 Unspecified cirrhosis of liver: Secondary | ICD-10-CM | POA: Insufficient documentation

## 2016-08-14 HISTORY — PX: CATARACT EXTRACTION W/PHACO: SHX586

## 2016-08-14 LAB — GLUCOSE, CAPILLARY: Glucose-Capillary: 103 mg/dL — ABNORMAL HIGH (ref 65–99)

## 2016-08-14 SURGERY — PHACOEMULSIFICATION, CATARACT, WITH IOL INSERTION
Anesthesia: Monitor Anesthesia Care | Site: Eye | Laterality: Right

## 2016-08-14 MED ORDER — POVIDONE-IODINE 5 % OP SOLN
OPHTHALMIC | Status: DC | PRN
  Administered 2016-08-14: 1 via OPHTHALMIC

## 2016-08-14 MED ORDER — LACTATED RINGERS IV SOLN
INTRAVENOUS | Status: DC
  Administered 2016-08-14: 1000 mL via INTRAVENOUS

## 2016-08-14 MED ORDER — MIDAZOLAM HCL 2 MG/2ML IJ SOLN
0.5000 mg | INTRAMUSCULAR | Status: DC | PRN
  Administered 2016-08-14: 2 mg via INTRAVENOUS
  Filled 2016-08-14: qty 2

## 2016-08-14 MED ORDER — PHENYLEPHRINE HCL 2.5 % OP SOLN
1.0000 [drp] | OPHTHALMIC | Status: AC
  Administered 2016-08-14 (×3): 1 [drp] via OPHTHALMIC

## 2016-08-14 MED ORDER — NEOMYCIN-POLYMYXIN-DEXAMETH 3.5-10000-0.1 OP SUSP
OPHTHALMIC | Status: DC | PRN
  Administered 2016-08-14: 2 [drp] via OPHTHALMIC

## 2016-08-14 MED ORDER — LIDOCAINE HCL (PF) 1 % IJ SOLN
INTRAMUSCULAR | Status: DC | PRN
  Administered 2016-08-14: .6 mL

## 2016-08-14 MED ORDER — TETRACAINE HCL 0.5 % OP SOLN
1.0000 [drp] | OPHTHALMIC | Status: AC
  Administered 2016-08-14 (×3): 1 [drp] via OPHTHALMIC

## 2016-08-14 MED ORDER — CYCLOPENTOLATE-PHENYLEPHRINE 0.2-1 % OP SOLN
1.0000 [drp] | OPHTHALMIC | Status: AC
  Administered 2016-08-14 (×3): 1 [drp] via OPHTHALMIC

## 2016-08-14 MED ORDER — PROVISC 10 MG/ML IO SOLN
INTRAOCULAR | Status: DC | PRN
  Administered 2016-08-14: .85 mL via INTRAOCULAR

## 2016-08-14 MED ORDER — BSS IO SOLN
INTRAOCULAR | Status: DC | PRN
  Administered 2016-08-14: 15 mL via INTRAOCULAR

## 2016-08-14 MED ORDER — EPINEPHRINE PF 1 MG/ML IJ SOLN
INTRAOCULAR | Status: DC | PRN
  Administered 2016-08-14: 500 mL

## 2016-08-14 MED ORDER — FENTANYL CITRATE (PF) 100 MCG/2ML IJ SOLN
25.0000 ug | INTRAMUSCULAR | Status: AC | PRN
  Administered 2016-08-14 (×2): 25 ug via INTRAVENOUS
  Filled 2016-08-14: qty 2

## 2016-08-14 MED ORDER — EPINEPHRINE PF 1 MG/ML IJ SOLN
INTRAMUSCULAR | Status: AC
  Filled 2016-08-14: qty 1

## 2016-08-14 MED ORDER — LIDOCAINE 3.5 % OP GEL OPTIME - NO CHARGE
OPHTHALMIC | Status: DC | PRN
  Administered 2016-08-14: 1 [drp] via OPHTHALMIC

## 2016-08-14 SURGICAL SUPPLY — 21 items
CAPSULAR TENSION RING-AMO (OPHTHALMIC RELATED) IMPLANT
CLOTH BEACON ORANGE TIMEOUT ST (SAFETY) ×3 IMPLANT
EYE SHIELD UNIVERSAL CLEAR (GAUZE/BANDAGES/DRESSINGS) ×3 IMPLANT
GLOVE BIOGEL PI IND STRL 6.5 (GLOVE) ×1 IMPLANT
GLOVE BIOGEL PI IND STRL 7.0 (GLOVE) ×1 IMPLANT
GLOVE BIOGEL PI INDICATOR 6.5 (GLOVE) ×2
GLOVE BIOGEL PI INDICATOR 7.0 (GLOVE) ×2
GLOVE EXAM NITRILE LRG STRL (GLOVE) IMPLANT
GLOVE EXAM NITRILE MD LF STRL (GLOVE) IMPLANT
KIT VITRECTOMY (OPHTHALMIC RELATED) IMPLANT
PAD ARMBOARD 7.5X6 YLW CONV (MISCELLANEOUS) ×3 IMPLANT
PROC W NO LENS (INTRAOCULAR LENS)
PROC W SPEC LENS (INTRAOCULAR LENS)
PROCESS W NO LENS (INTRAOCULAR LENS) IMPLANT
PROCESS W SPEC LENS (INTRAOCULAR LENS) IMPLANT
RETRACTOR IRIS SIGHTPATH (OPHTHALMIC RELATED) IMPLANT
RING MALYGIN (MISCELLANEOUS) IMPLANT
SIGHTPATH CAT PROC W REG LENS (Ophthalmic Related) ×3 IMPLANT
SYRINGE LUER LOK 1CC (MISCELLANEOUS) ×3 IMPLANT
VISCOELASTIC ADDITIONAL (OPHTHALMIC RELATED) IMPLANT
WATER STERILE IRR 250ML POUR (IV SOLUTION) ×3 IMPLANT

## 2016-08-14 NOTE — Transfer of Care (Signed)
Immediate Anesthesia Transfer of Care Note  Patient: Nancy Hodges  Procedure(s) Performed: Procedure(s) with comments: CATARACT EXTRACTION PHACO AND INTRAOCULAR LENS PLACEMENT RIGHT EYE CDE 10.53 (Right) - right  Patient Location: Short Stay  Anesthesia Type:MAC  Level of Consciousness: awake, alert , oriented and patient cooperative  Airway & Oxygen Therapy: Patient Spontanous Breathing  Post-op Assessment: Report given to RN, Post -op Vital signs reviewed and stable and Patient moving all extremities  Post vital signs: Reviewed and stable  Last Vitals:  Vitals:   08/14/16 1030 08/14/16 1035  BP: (!) 175/81   Resp: 20 14    Last Pain: There were no vitals filed for this visit.       Complications: No apparent anesthesia complications

## 2016-08-14 NOTE — H&P (Signed)
I have reviewed the H&P, the patient was re-examined, and I have identified no interval changes in medical condition and plan of care since the history and physical of record  

## 2016-08-14 NOTE — Op Note (Signed)
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  Assistants: None  Anesthesia: Topical with MAC  Indications: Painless, progressive loss of vision with compromise of daily activities.  Surgery: Cataract Extraction with Intraocular lens Implant Right Eye  Discription: The patient had dilating drops and viscous lidocaine placed into the Right eye in the pre-op holding area. After transfer to the operating room, a time out was performed. The patient was then prepped and draped. Beginning with a 77 degree blade a paracentesis port was made at the surgeon's 2 o'clock position. The anterior chamber was then filled with 1% non-preserved lidocaine. This was followed by filling the anterior chamber with Provisc.  A 2.69m keratome blade was used to make a clear corneal incision at the temporal limbus.  A bent cystatome needle was used to create a continuous tear capsulotomy. Hydrodissection was performed with balanced salt solution on a Fine canula. The lens nucleus was then removed using the phacoemulsification handpiece. Residual cortex was removed with the I&A handpiece. The anterior chamber and capsular bag were refilled with Provisc. A posterior chamber intraocular lens was placed into the capsular bag with it's injector. The implant was positioned with the Kuglan hook. The Provisc was then removed from the anterior chamber and capsular bag with the I&A handpiece. Stromal hydration of the main incision and paracentesis port was performed with BSS on a Fine canula. The wounds were tested for leak which was negative. The patient tolerated the procedure well. There were no operative complications. The patient was then transferred to the recovery room in stable condition.  Complications: None  Specimen: None  EBL: None  Prosthetic device: Abbott Technis, PCB00,  power 22.5, SN [id number]

## 2016-08-14 NOTE — Anesthesia Preprocedure Evaluation (Signed)
Anesthesia Evaluation  Patient identified by MRN, date of birth, ID band Patient awake    Reviewed: Allergy & Precautions, NPO status , Patient's Chart, lab work & pertinent test results  Airway Mallampati: II  TM Distance: >3 FB     Dental  (+) Poor Dentition, Edentulous Upper   Pulmonary asthma , COPD, former smoker,  VATS - RU lobectomy for CA    breath sounds clear to auscultation       Cardiovascular  Rhythm:Regular Rate:Normal     Neuro/Psych Anxiety    GI/Hepatic hiatal hernia, GERD  ,(+) Cirrhosis   Esophageal Varices    ,   Endo/Other  diabetes, Type 2  Renal/GU      Musculoskeletal   Abdominal   Peds  Hematology   Anesthesia Other Findings   Reproductive/Obstetrics                             Anesthesia Physical Anesthesia Plan  ASA: IV  Anesthesia Plan: MAC   Post-op Pain Management:    Induction: Intravenous  Airway Management Planned: Nasal Cannula  Additional Equipment:   Intra-op Plan:   Post-operative Plan:   Informed Consent: I have reviewed the patients History and Physical, chart, labs and discussed the procedure including the risks, benefits and alternatives for the proposed anesthesia with the patient or authorized representative who has indicated his/her understanding and acceptance.     Plan Discussed with:   Anesthesia Plan Comments:         Anesthesia Quick Evaluation

## 2016-08-14 NOTE — Anesthesia Postprocedure Evaluation (Signed)
Anesthesia Post Note  Patient: Nancy Hodges  Procedure(s) Performed: Procedure(s) (LRB): CATARACT EXTRACTION PHACO AND INTRAOCULAR LENS PLACEMENT RIGHT EYE CDE 10.53 (Right)  Patient location during evaluation: Short Stay Anesthesia Type: MAC Level of consciousness: awake, awake and alert and oriented Pain management: pain level controlled Vital Signs Assessment: post-procedure vital signs reviewed and stable Respiratory status: spontaneous breathing, nonlabored ventilation and respiratory function stable Cardiovascular status: blood pressure returned to baseline Postop Assessment: no signs of nausea or vomiting Anesthetic complications: no     Last Vitals:  Vitals:   08/14/16 1030 08/14/16 1035  BP: (!) 175/81   Resp: 20 14    Last Pain: There were no vitals filed for this visit.               Zriyah Kopplin J

## 2016-08-14 NOTE — Discharge Instructions (Signed)

## 2016-08-15 ENCOUNTER — Encounter (HOSPITAL_COMMUNITY): Payer: Self-pay | Admitting: Ophthalmology

## 2016-08-26 ENCOUNTER — Encounter (HOSPITAL_COMMUNITY): Payer: Self-pay

## 2016-08-26 ENCOUNTER — Ambulatory Visit (HOSPITAL_COMMUNITY)
Admission: RE | Admit: 2016-08-26 | Discharge: 2016-08-26 | Disposition: A | Discharge status: Home / Self Care | Payer: Medicare Other | Source: Ambulatory Visit | Attending: Internal Medicine | Admitting: Internal Medicine

## 2016-08-26 ENCOUNTER — Other Ambulatory Visit (HOSPITAL_BASED_OUTPATIENT_CLINIC_OR_DEPARTMENT_OTHER): Payer: Medicare Other

## 2016-08-26 DIAGNOSIS — C3411 Malignant neoplasm of upper lobe, right bronchus or lung: Secondary | ICD-10-CM

## 2016-08-26 DIAGNOSIS — C3491 Malignant neoplasm of unspecified part of right bronchus or lung: Secondary | ICD-10-CM | POA: Diagnosis not present

## 2016-08-26 DIAGNOSIS — K7460 Unspecified cirrhosis of liver: Secondary | ICD-10-CM | POA: Insufficient documentation

## 2016-08-26 DIAGNOSIS — K8020 Calculus of gallbladder without cholecystitis without obstruction: Secondary | ICD-10-CM | POA: Insufficient documentation

## 2016-08-26 DIAGNOSIS — I700 Atherosclerosis of aorta: Secondary | ICD-10-CM | POA: Insufficient documentation

## 2016-08-26 DIAGNOSIS — R161 Splenomegaly, not elsewhere classified: Secondary | ICD-10-CM | POA: Diagnosis not present

## 2016-08-26 LAB — COMPREHENSIVE METABOLIC PANEL WITH GFR
ALT: 29 U/L (ref 0–55)
AST: 40 U/L — ABNORMAL HIGH (ref 5–34)
Albumin: 3.9 g/dL (ref 3.5–5.0)
Alkaline Phosphatase: 174 U/L — ABNORMAL HIGH (ref 40–150)
Anion Gap: 8 meq/L (ref 3–11)
BUN: 11.4 mg/dL (ref 7.0–26.0)
CO2: 26 meq/L (ref 22–29)
Calcium: 10.4 mg/dL (ref 8.4–10.4)
Chloride: 108 meq/L (ref 98–109)
Creatinine: 0.7 mg/dL (ref 0.6–1.1)
EGFR: 80 ml/min/1.73 m2 — ABNORMAL LOW
Glucose: 110 mg/dL (ref 70–140)
Potassium: 5 meq/L (ref 3.5–5.1)
Sodium: 142 meq/L (ref 136–145)
Total Bilirubin: 1.5 mg/dL — ABNORMAL HIGH (ref 0.20–1.20)
Total Protein: 7.3 g/dL (ref 6.4–8.3)

## 2016-08-26 LAB — CBC WITH DIFFERENTIAL/PLATELET
BASO%: 0.8 % (ref 0.0–2.0)
Basophils Absolute: 0 10e3/uL (ref 0.0–0.1)
EOS%: 2.6 % (ref 0.0–7.0)
Eosinophils Absolute: 0.1 10e3/uL (ref 0.0–0.5)
HCT: 39.3 % (ref 34.8–46.6)
HGB: 13.5 g/dL (ref 11.6–15.9)
LYMPH%: 23.9 % (ref 14.0–49.7)
MCH: 33 pg (ref 25.1–34.0)
MCHC: 34.3 g/dL (ref 31.5–36.0)
MCV: 96.1 fL (ref 79.5–101.0)
MONO#: 0.4 10e3/uL (ref 0.1–0.9)
MONO%: 7.9 % (ref 0.0–14.0)
NEUT#: 2.9 10e3/uL (ref 1.5–6.5)
NEUT%: 64.8 % (ref 38.4–76.8)
Platelets: 192 10e3/uL (ref 145–400)
RBC: 4.09 10e6/uL (ref 3.70–5.45)
RDW: 13.9 % (ref 11.2–14.5)
WBC: 4.5 10e3/uL (ref 3.9–10.3)
lymph#: 1.1 10e3/uL (ref 0.9–3.3)

## 2016-08-26 MED ORDER — IOPAMIDOL (ISOVUE-300) INJECTION 61%
INTRAVENOUS | Status: AC
  Filled 2016-08-26: qty 100

## 2016-08-26 MED ORDER — IOPAMIDOL (ISOVUE-300) INJECTION 61%
INTRAVENOUS | Status: AC
  Filled 2016-08-26: qty 30

## 2016-08-26 MED ORDER — IOPAMIDOL (ISOVUE-300) INJECTION 61%
30.0000 mL | Freq: Once | INTRAVENOUS | Status: AC | PRN
  Administered 2016-08-26: 30 mL via ORAL

## 2016-08-26 MED ORDER — IOPAMIDOL (ISOVUE-300) INJECTION 61%
100.0000 mL | Freq: Once | INTRAVENOUS | Status: AC | PRN
  Administered 2016-08-26: 100 mL via INTRAVENOUS

## 2016-09-02 ENCOUNTER — Telehealth: Payer: Self-pay | Admitting: Internal Medicine

## 2016-09-02 ENCOUNTER — Encounter: Payer: Self-pay | Admitting: Internal Medicine

## 2016-09-02 ENCOUNTER — Ambulatory Visit (HOSPITAL_BASED_OUTPATIENT_CLINIC_OR_DEPARTMENT_OTHER): Payer: Medicare Other | Admitting: Internal Medicine

## 2016-09-02 VITALS — BP 148/77 | HR 73 | Temp 98.0°F | Resp 18 | Wt 142.3 lb

## 2016-09-02 DIAGNOSIS — C773 Secondary and unspecified malignant neoplasm of axilla and upper limb lymph nodes: Secondary | ICD-10-CM | POA: Diagnosis present

## 2016-09-02 DIAGNOSIS — C3491 Malignant neoplasm of unspecified part of right bronchus or lung: Secondary | ICD-10-CM

## 2016-09-02 DIAGNOSIS — C3411 Malignant neoplasm of upper lobe, right bronchus or lung: Secondary | ICD-10-CM

## 2016-09-02 DIAGNOSIS — J449 Chronic obstructive pulmonary disease, unspecified: Secondary | ICD-10-CM

## 2016-09-02 NOTE — Progress Notes (Signed)
[place] [phone]   [phone]  OFFICE PROGRESS NOTE  [name], PA-C [address] [place] [address]  DIAGNOSIS: Metastatic non-small cell lung cancer, adenocarcinoma. This was initially diagnosed as stage IB (T2a, N0, M0) In March of 2014, status post right upper lobectomy with lymph node dissection but the patient has evidence for disease recurrence in the right axilla status post resection.  PRIOR THERAPY:  1) Status post right axillary lymph node biopsy on 05/01/2014 under the care of Dr. Roxan Hockey. 2) Systemic chemotherapy with carboplatin for AUC of 5 and Alimta 500 mg/M2 every 3 weeks. First cycle 05/31/2014. Status post 6 cycles. Last dose was given 09/22/2014. 3) palliative radiotherapy to the subcarinal lymph node under the care of Dr. Sondra Come  CURRENT THERAPY:  Observation.  INTERVAL HISTORY: Nancy Hodges 71 y.o. female came to the clinic today accompanied by her friend for follow-up visit. The patient is feeling fine today with no specific complaints. The patient is feeling fine today with no specific complaints. She tolerated the previous course of palliative radiotherapy to the mediastinal lymph nodes fairly well. She denied having any current chest pain, shortness of breath, cough or hemoptysis. She has no nausea or vomiting. She denied having any significant weight loss or night sweats. She had a cataract surgery performed recently. She had repeat CT scan of the chest, abdomen and pelvis and she is here for evaluation and discussion of her scan results.   MEDICAL HISTORY: Past Medical History:  Diagnosis Date  . Allergic rhinitis   . Anxiety   . Arthritis   . Asthma   . Atrial flutter ([place])   . Cholelithiases 09/26/2015  . Diabetes mellitus without complication ([facility])    steroid induced from chemo  . Diverticulosis   . GERD (gastroesophageal reflux disease)   . H/O hiatal hernia   . History of  kidney stones   . Internal hemorrhoid   . Lung cancer ([place])    a. Stage IB non-small cell carcinoma, s/p R VATS, wedge resection, RU lobectomy 10/2012.  . Paroxysmal atrial fibrillation ([facility])    a. anticoagulated with Xarelto  . Pneumonia 06/2016   [facility]  . Pulmonary nodule   . Tachycardia-bradycardia syndrome ([place])    a. 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    ALLERGIES:  is allergic to prednisone; septra [sulfamethoxazole-trimethoprim]; penicillins; and vioxx [rofecoxib].  MEDICATIONS:  Current Outpatient Prescriptions  Medication Sig Dispense Refill  . acetaminophen (TYLENOL) 500 MG tablet Take 500 mg by mouth every 6 (six) hours as needed for moderate pain or fever.     [name] albuterol (PROVENTIL HFA;VENTOLIN HFA) 108 (90 BASE) MCG/ACT inhaler Inhale 1-2 puffs into the lungs every 6 (six) hours as needed for wheezing or shortness of breath.    [name] albuterol (PROVENTIL) (2.5 MG/3ML) 0.083% nebulizer solution Take 3 mLs (2.5 mg total) by nebulization every 2 (two) hours as needed for shortness of breath. 120 mL 0  . BESIVANCE 0.6 % SUSP INSTILL ONE DROP IN SURGICAL EYE TID BEGINNING 3 DAYS PRIOR TO SURGERY AND CONTINUE FOR 1 WEEK AFTER SURGERY  1  . budesonide (PULMICORT) 0.5 MG/2ML nebulizer solution Inhale 1 mL into the lungs 2 (two) times daily.  0  . diltiazem (CARDIZEM CD) 240 MG 24 hr capsule Take 1 capsule (240 mg total) by mouth daily. 30 capsule 11  . fexofenadine (ALLEGRA) 180 MG tablet Take 180 mg by mouth daily.    [name]  ipratropium-albuterol (DUONEB) 0.5-2.5 (3) MG/3ML SOLN Take 3 mLs by nebulization every 6 (six) hours as needed (shortness of breath or wheezing).     [name] ketorolac (ACULAR) 0.5 % ophthalmic solution   1  . meclizine (ANTIVERT) 12.5 MG tablet Take 12.5 mg by mouth 3 (three) times daily as needed for dizziness.    . rivaroxaban (XARELTO) 20 MG TABS tablet Take 1 tablet (20 mg total) by mouth daily with supper. 30 tablet 11   No current  facility-administered medications for this visit.     SURGICAL HISTORY:  Past Surgical History:  Procedure Laterality Date  . CATARACT EXTRACTION W/PHACO Right 08/14/2016   Procedure: CATARACT EXTRACTION PHACO AND INTRAOCULAR LENS PLACEMENT RIGHT EYE CDE 10.53;  Surgeon: Tonny Branch, MD;  Location: [facility];  Service: Ophthalmology;  Laterality: Right;  right  . COLONOSCOPY     [facility]: cattered sigmoid diverticula, internal hemorrhoids, sessile polyp in ascending and mid transverse colon (tubular adenoma).   . colonoscopy with polypectomy  2015  . ESOPHAGOGASTRODUODENOSCOPY     [facility]: bile reflux in stomach, mild gastritis, hiatal hernia  . ESOPHAGOGASTRODUODENOSCOPY N/A 01/31/2016   Procedure: ESOPHAGOGASTRODUODENOSCOPY (EGD);  Surgeon: [name], MD;  Location: [facility];  Service: Endoscopy;  Laterality: N/A;  1200  . LOBECTOMY Right 10/27/2012   Procedure: LOBECTOMY;  Surgeon: [name], MD;  Location: [facility];  Service: Thoracic;  Laterality: Right;   RIGHT UPPER LOBECTOMY & Node Dissection  . LYMPH NODE BIOPSY Right 05/01/2014   Procedure: LYMPH NODE BIOPSY, Right Axillary;  Surgeon: [name], MD;  Location: [facility];  Service: Thoracic;  Laterality: Right;  . PARTIAL HYSTERECTOMY    . PERMANENT PACEMAKER INSERTION N/A 09/14/2012   Nanostim (SJM) leadless pacemaker (LEADLESS II STUDY PATIENT) implanted by Dr [name]  . VIDEO ASSISTED THORACOSCOPY (VATS)/WEDGE RESECTION Right 10/27/2012   Procedure: VIDEO ASSISTED THORACOSCOPY (VATS),RGHT UPPER LOBE LUNG WEDGE RESECTION;  Surgeon: [name], MD;  Location: [facility];  Service: Thoracic;  Laterality: Right;    REVIEW OF SYSTEMS:  A comprehensive review of systems was negative.   PHYSICAL EXAMINATION: General appearance: alert, cooperative and no distress Head: Normocephalic, without obvious abnormality, atraumatic Neck: no adenopathy, no JVD, supple, symmetrical, trachea midline and thyroid not enlarged,  symmetric, no tenderness/mass/nodules Lymph nodes: Cervical, supraclavicular, and axillary nodes normal. Resp: clear to auscultation bilaterally Back: symmetric, no curvature. ROM normal. No CVA tenderness. Cardio: regular rate and rhythm, S1, S2 normal, no murmur, click, rub or gallop GI: soft, non-tender; bowel sounds normal; no masses,  no organomegaly Extremities: extremities normal, atraumatic, no cyanosis or edema  ECOG PERFORMANCE STATUS: 0 - Asymptomatic  Blood pressure (!) 148/77, pulse 73, temperature 98 F (36.7 C), temperature source Oral, resp. rate 18, weight 142 lb 4.8 oz (64.5 kg), SpO2 97 %.  LABORATORY DATA: Lab Results  Component Value Date   WBC 4.5 08/26/2016   HGB 13.5 08/26/2016   HCT 39.3 08/26/2016   MCV 96.1 08/26/2016   PLT 192 08/26/2016      Chemistry      Component Value Date/Time   NA 142 08/26/2016 1124   K 5.0 08/26/2016 1124   CL 107 08/12/2016 0937   CO2 26 08/26/2016 1124   BUN 11.4 08/26/2016 1124   CREATININE 0.7 08/26/2016 1124      Component Value Date/Time   CALCIUM 10.4 08/26/2016 1124   ALKPHOS 174 (H) 08/26/2016 1124   AST 40 (H) 08/26/2016 1124   ALT 29  08/26/2016 1124   BILITOT 1.50 (H) 08/26/2016 1124       RADIOGRAPHIC STUDIES: Ct Chest W Contrast  Result Date: 08/26/2016 CLINICAL DATA:  Restaging non small cell lung cancer. EXAM: CT CHEST, ABDOMEN, AND PELVIS WITH CONTRAST TECHNIQUE: Multidetector CT imaging of the chest, abdomen and pelvis was performed following the standard protocol during bolus administration of intravenous contrast. CONTRAST:  51m ISOVUE-300 IOPAMIDOL (ISOVUE-300) INJECTION 61%, 1015mISOVUE-300 IOPAMIDOL (ISOVUE-300) INJECTION 61% COMPARISON:  Chest CT 05/12/2016 and PET-CT 04/24/2016 FINDINGS: CT CHEST FINDINGS Chest wall: Stable surgical changes involving the right breast and right axilla. No breast masses, supraclavicular or axillary lymphadenopathy. The thyroid gland is grossly normal.  Cardiovascular: The heart is normal in size. No pericardial effusion. The aorta is normal in caliber. No dissection. Moderate atherosclerotic calcifications. Stable coronary artery calcifications. Mediastinum/Nodes: The esophagus is grossly normal. No mediastinal or hilar mass or adenopathy. Subcarinal lymph node has resolved Lungs/Pleura: Surgical changes likely from a right upper lobe lobectomy. No findings to suggest recurrent tumor. Stable emphysematous changes and pulmonary scarring. Biapical pleuroparenchymal scarring. Stable dating ground-glass opacity at the left lung apex. 3.5 mm nodular density in the superior segment of the left lower lobe on image number 64 has decreased in size since the prior CT where it measured 5.5 mm. 9.5 x 9.0 mm left upper lobe pulmonary nodule on image number 80 previously measured 11.5 x 9.0 mm. 5 mm right lower lobe pulmonary nodule on image 93 is stable. 7 x 5 mm right lower lobe pulmonary nodule on image number 110 previously measured 7.5 x 6 mm new line no new pulmonary lesions. No acute pulmonary findings. Musculoskeletal: No significant bony findings. CT ABDOMEN PELVIS FINDINGS Hepatobiliary: Stable cirrhotic changes involving the liver but no focal hepatic lesions to suggest metastatic disease or hepatoma. Small calcified granulomas are noted. The gallbladder demonstrates layering calcified gallstones. No common bile duct dilatation. Pancreas: No mass, inflammation or ductal dilatation. Spleen: Stable splenomegaly.  Small stable cysts. Adrenals/Urinary Tract: The adrenal glands and kidneys are unremarkable and stable. Cortical scarring changes are noted. No mass or hydronephrosis. Stomach/Bowel: Small hiatal hernia noted. The stomach, duodenum, small bowel and colon are grossly normal. Vascular/Lymphatic: Atherosclerotic calcifications involving the aorta. No aneurysm or dissection. Branch vessel calcifications. The major venous structures are patent. No mesenteric or  retroperitoneal mass or adenopathy. Reproductive: The uterus is surgically absent. Both ovaries are still present and appear normal. Other: No pelvic mass or adenopathy. No free pelvic fluid collections. No inguinal mass or adenopathy. No abdominal wall hernia or subcutaneous lesions. Musculoskeletal: No significant bony findings. IMPRESSION: 1. Interval decrease in size of several pulmonary nodules since the prior examination. No new pulmonary nodules. 2. No mediastinal or hilar mass or lymphadenopathy. 3. Stable atherosclerotic calcifications involving the thoracic and abdominal aorta and branch vessels. 4. No CT findings for metastatic disease involving the abdomen/pelvis. 5. Cirrhosis and splenomegaly. 6. Cholelithiasis. Electronically Signed   By: P.Marijo Sanes.D.   On: 08/26/2016 16:32   Ct Abdomen Pelvis W Contrast  Result Date: 08/26/2016 CLINICAL DATA:  Restaging non small cell lung cancer. EXAM: CT CHEST, ABDOMEN, AND PELVIS WITH CONTRAST TECHNIQUE: Multidetector CT imaging of the chest, abdomen and pelvis was performed following the standard protocol during bolus administration of intravenous contrast. CONTRAST:  3013mSOVUE-300 IOPAMIDOL (ISOVUE-300) INJECTION 61%, 100m52mOVUE-300 IOPAMIDOL (ISOVUE-300) INJECTION 61% COMPARISON:  Chest CT 05/12/2016 and PET-CT 04/24/2016 FINDINGS: CT CHEST FINDINGS Chest wall: Stable surgical changes involving the right breast and right  axilla. No breast masses, supraclavicular or axillary lymphadenopathy. The thyroid gland is grossly normal. Cardiovascular: The heart is normal in size. No pericardial effusion. The aorta is normal in caliber. No dissection. Moderate atherosclerotic calcifications. Stable coronary artery calcifications. Mediastinum/Nodes: The esophagus is grossly normal. No mediastinal or hilar mass or adenopathy. Subcarinal lymph node has resolved Lungs/Pleura: Surgical changes likely from a right upper lobe lobectomy. No findings to suggest  recurrent tumor. Stable emphysematous changes and pulmonary scarring. Biapical pleuroparenchymal scarring. Stable dating ground-glass opacity at the left lung apex. 3.5 mm nodular density in the superior segment of the left lower lobe on image number 64 has decreased in size since the prior CT where it measured 5.5 mm. 9.5 x 9.0 mm left upper lobe pulmonary nodule on image number 80 previously measured 11.5 x 9.0 mm. 5 mm right lower lobe pulmonary nodule on image 93 is stable. 7 x 5 mm right lower lobe pulmonary nodule on image number 110 previously measured 7.5 x 6 mm new line no new pulmonary lesions. No acute pulmonary findings. Musculoskeletal: No significant bony findings. CT ABDOMEN PELVIS FINDINGS Hepatobiliary: Stable cirrhotic changes involving the liver but no focal hepatic lesions to suggest metastatic disease or hepatoma. Small calcified granulomas are noted. The gallbladder demonstrates layering calcified gallstones. No common bile duct dilatation. Pancreas: No mass, inflammation or ductal dilatation. Spleen: Stable splenomegaly.  Small stable cysts. Adrenals/Urinary Tract: The adrenal glands and kidneys are unremarkable and stable. Cortical scarring changes are noted. No mass or hydronephrosis. Stomach/Bowel: Small hiatal hernia noted. The stomach, duodenum, small bowel and colon are grossly normal. Vascular/Lymphatic: Atherosclerotic calcifications involving the aorta. No aneurysm or dissection. Branch vessel calcifications. The major venous structures are patent. No mesenteric or retroperitoneal mass or adenopathy. Reproductive: The uterus is surgically absent. Both ovaries are still present and appear normal. Other: No pelvic mass or adenopathy. No free pelvic fluid collections. No inguinal mass or adenopathy. No abdominal wall hernia or subcutaneous lesions. Musculoskeletal: No significant bony findings. IMPRESSION: 1. Interval decrease in size of several pulmonary nodules since the prior  examination. No new pulmonary nodules. 2. No mediastinal or hilar mass or lymphadenopathy. 3. Stable atherosclerotic calcifications involving the thoracic and abdominal aorta and branch vessels. 4. No CT findings for metastatic disease involving the abdomen/pelvis. 5. Cirrhosis and splenomegaly. 6. Cholelithiasis. Electronically Signed   By: Marijo Sanes M.D.   On: 08/26/2016 16:32   ASSESSMENT AND PLAN:  This is a very pleasant 71 years old white female with metastatic non-small cell lung cancer, adenocarcinoma with negative EGFR mutation and negative ALK gene translocation. She is status post right upper lobectomy with lymph node dissection that has evidence for disease recurrence in the right axilla status post resection. She status post systemic chemotherapy was carboplatin and Alimta She also recently underwent palliative radiotherapy to the mediastinal lymph nodes under the care of Dr. Sondra Come. She has repeat CT scan of the chest, abdomen and pelvis performed recently. The recent scan showed improvement in her disease was decrease in the size of several pulmonary nodules and no mediastinal or hilar adenopathy. I discussed the scan results with the patient today. I recommended for her to continue on observation with repeat CT scan of the chest, abdomen and pelvis in 3 months. She was advised to call immediately if she has any concerning symptoms in the interval. The patient voices understanding of current disease status and treatment options and is in agreement with the current care plan.  All questions were answered.  The patient knows to call the clinic with any problems, questions or concerns. We can certainly see the patient much sooner if necessary. I spent 10 minutes counseling the patient face to face. The total time spent in the appointment was 15 minutes.  Disclaimer: This note was dictated with voice recognition software. Similar sounding words can inadvertently be transcribed and may  not be corrected upon review.

## 2016-09-02 NOTE — Telephone Encounter (Signed)
Appointments scheduled per 1/30 LOS. Patient given AVS report and calendars with future scheduled appointments. Patient requested to have water-based contrast.

## 2016-09-03 ENCOUNTER — Telehealth: Payer: Self-pay | Admitting: Oncology

## 2016-09-03 NOTE — Telephone Encounter (Signed)
Nancy Hodges left a message asking if Dr. Sondra Come would be willing to manage her oxygen.  She said she was a patient at [facility] until November.  She was on oxygen but now her oxygen saturation is 97-98% and she does not need it anymore.  She is not able to find a doctor that will sign off on the order to pick up her oxygen tanks.

## 2016-09-22 ENCOUNTER — Encounter (HOSPITAL_COMMUNITY)
Admission: RE | Admit: 2016-09-22 | Discharge: 2016-09-22 | Disposition: A | Discharge status: Home / Self Care | Payer: Medicare Other | Source: Ambulatory Visit | Attending: Ophthalmology | Admitting: Ophthalmology

## 2016-09-22 ENCOUNTER — Encounter (HOSPITAL_COMMUNITY): Payer: Self-pay

## 2016-09-25 ENCOUNTER — Ambulatory Visit (HOSPITAL_COMMUNITY): Payer: Medicare Other | Admitting: Anesthesiology

## 2016-09-25 ENCOUNTER — Encounter (HOSPITAL_COMMUNITY)
Admission: RE | Disposition: A | Discharge status: Home / Self Care | Payer: Self-pay | Source: Ambulatory Visit | Attending: Ophthalmology

## 2016-09-25 ENCOUNTER — Ambulatory Visit (HOSPITAL_COMMUNITY)
Admission: RE | Admit: 2016-09-25 | Discharge: 2016-09-25 | Disposition: A | Discharge status: Home / Self Care | Payer: Medicare Other | Source: Ambulatory Visit | Attending: Ophthalmology | Admitting: Ophthalmology

## 2016-09-25 ENCOUNTER — Encounter (HOSPITAL_COMMUNITY): Payer: Self-pay | Admitting: *Deleted

## 2016-09-25 DIAGNOSIS — E119 Type 2 diabetes mellitus without complications: Secondary | ICD-10-CM | POA: Diagnosis not present

## 2016-09-25 DIAGNOSIS — F419 Anxiety disorder, unspecified: Secondary | ICD-10-CM | POA: Diagnosis not present

## 2016-09-25 DIAGNOSIS — Z87891 Personal history of nicotine dependence: Secondary | ICD-10-CM | POA: Diagnosis not present

## 2016-09-25 DIAGNOSIS — H25042 Posterior subcapsular polar age-related cataract, left eye: Secondary | ICD-10-CM | POA: Diagnosis not present

## 2016-09-25 DIAGNOSIS — I8510 Secondary esophageal varices without bleeding: Secondary | ICD-10-CM | POA: Insufficient documentation

## 2016-09-25 DIAGNOSIS — Z888 Allergy status to other drugs, medicaments and biological substances status: Secondary | ICD-10-CM | POA: Insufficient documentation

## 2016-09-25 DIAGNOSIS — K7460 Unspecified cirrhosis of liver: Secondary | ICD-10-CM | POA: Insufficient documentation

## 2016-09-25 DIAGNOSIS — H5202 Hypermetropia, left eye: Secondary | ICD-10-CM | POA: Insufficient documentation

## 2016-09-25 DIAGNOSIS — Z880 Allergy status to penicillin: Secondary | ICD-10-CM | POA: Diagnosis present

## 2016-09-25 DIAGNOSIS — K219 Gastro-esophageal reflux disease without esophagitis: Secondary | ICD-10-CM | POA: Diagnosis not present

## 2016-09-25 DIAGNOSIS — J45909 Unspecified asthma, uncomplicated: Secondary | ICD-10-CM | POA: Diagnosis not present

## 2016-09-25 DIAGNOSIS — Z882 Allergy status to sulfonamides status: Secondary | ICD-10-CM | POA: Diagnosis not present

## 2016-09-25 DIAGNOSIS — K449 Diaphragmatic hernia without obstruction or gangrene: Secondary | ICD-10-CM | POA: Diagnosis not present

## 2016-09-25 DIAGNOSIS — H2512 Age-related nuclear cataract, left eye: Secondary | ICD-10-CM | POA: Insufficient documentation

## 2016-09-25 DIAGNOSIS — H269 Unspecified cataract: Secondary | ICD-10-CM | POA: Diagnosis present

## 2016-09-25 DIAGNOSIS — H5231 Anisometropia: Secondary | ICD-10-CM | POA: Diagnosis not present

## 2016-09-25 HISTORY — PX: CATARACT EXTRACTION W/PHACO: SHX586

## 2016-09-25 SURGERY — PHACOEMULSIFICATION, CATARACT, WITH IOL INSERTION
Anesthesia: Monitor Anesthesia Care | Site: Eye | Laterality: Left

## 2016-09-25 MED ORDER — CYCLOPENTOLATE-PHENYLEPHRINE 0.2-1 % OP SOLN
1.0000 [drp] | OPHTHALMIC | Status: AC
  Administered 2016-09-25 (×3): 1 [drp] via OPHTHALMIC

## 2016-09-25 MED ORDER — POVIDONE-IODINE 5 % OP SOLN
OPHTHALMIC | Status: DC | PRN
  Administered 2016-09-25: 1 via OPHTHALMIC

## 2016-09-25 MED ORDER — LIDOCAINE HCL (PF) 1 % IJ SOLN
INTRAMUSCULAR | Status: DC | PRN
  Administered 2016-09-25: .7 mL

## 2016-09-25 MED ORDER — NEOMYCIN-POLYMYXIN-DEXAMETH 3.5-10000-0.1 OP SUSP
OPHTHALMIC | Status: DC | PRN
  Administered 2016-09-25: 2 [drp] via OPHTHALMIC

## 2016-09-25 MED ORDER — MIDAZOLAM HCL 2 MG/2ML IJ SOLN
1.0000 mg | INTRAMUSCULAR | Status: AC
  Administered 2016-09-25 (×2): 1 mg via INTRAVENOUS
  Filled 2016-09-25: qty 2

## 2016-09-25 MED ORDER — PHENYLEPHRINE HCL 2.5 % OP SOLN
1.0000 [drp] | OPHTHALMIC | Status: AC
  Administered 2016-09-25 (×3): 1 [drp] via OPHTHALMIC

## 2016-09-25 MED ORDER — LACTATED RINGERS IV SOLN
INTRAVENOUS | Status: DC
  Administered 2016-09-25: 09:00:00 via INTRAVENOUS

## 2016-09-25 MED ORDER — LIDOCAINE HCL 3.5 % OP GEL
1.0000 "application " | Freq: Once | OPHTHALMIC | Status: AC
  Administered 2016-09-25: 1 via OPHTHALMIC

## 2016-09-25 MED ORDER — TETRACAINE HCL 0.5 % OP SOLN
1.0000 [drp] | OPHTHALMIC | Status: AC
  Administered 2016-09-25 (×3): 1 [drp] via OPHTHALMIC

## 2016-09-25 MED ORDER — EPINEPHRINE PF 1 MG/ML IJ SOLN
INTRAOCULAR | Status: DC | PRN
  Administered 2016-09-25: 500 mL

## 2016-09-25 MED ORDER — BSS IO SOLN
INTRAOCULAR | Status: DC | PRN
  Administered 2016-09-25: 500 mL via INTRAOCULAR

## 2016-09-25 MED ORDER — PROVISC 10 MG/ML IO SOLN
INTRAOCULAR | Status: DC | PRN
  Administered 2016-09-25: 0.85 mL via INTRAOCULAR

## 2016-09-25 MED ORDER — FENTANYL CITRATE (PF) 100 MCG/2ML IJ SOLN
25.0000 ug | Freq: Once | INTRAMUSCULAR | Status: AC
  Administered 2016-09-25: 25 ug via INTRAVENOUS
  Filled 2016-09-25: qty 2

## 2016-09-25 SURGICAL SUPPLY — 10 items
EYE SHIELD UNIVERSAL CLEAR (GAUZE/BANDAGES/DRESSINGS) ×3 IMPLANT
GLOVE BIOGEL PI IND STRL 6.5 (GLOVE) ×1 IMPLANT
GLOVE BIOGEL PI IND STRL 8 (GLOVE) ×1 IMPLANT
GLOVE BIOGEL PI INDICATOR 6.5 (GLOVE) ×2
GLOVE BIOGEL PI INDICATOR 8 (GLOVE) ×2
PAD ARMBOARD 7.5X6 YLW CONV (MISCELLANEOUS) ×3 IMPLANT
SIGHTPATH CAT PROC W REG LENS (Ophthalmic Related) ×3 IMPLANT
SYRINGE LUER LOK 1CC (MISCELLANEOUS) ×3 IMPLANT
TAPE TRANSPARENT 1/2IN (GAUZE/BANDAGES/DRESSINGS) ×3 IMPLANT
WATER STERILE IRR 250ML POUR (IV SOLUTION) ×3 IMPLANT

## 2016-09-25 NOTE — Transfer of Care (Signed)
Immediate Anesthesia Transfer of Care Note  Patient: Nancy Hodges  Procedure(s) Performed: Procedure(s) with comments: CATARACT EXTRACTION PHACO AND INTRAOCULAR LENS PLACEMENT (IOC) (Left) - left CDE 8.81  Patient Location: Short Stay  Anesthesia Type:MAC  Level of Consciousness: awake, alert , oriented and patient cooperative  Airway & Oxygen Therapy: Patient Spontanous Breathing  Post-op Assessment: Report given to RN and Post -op Vital signs reviewed and stable  Post vital signs: Reviewed and stable  Last Vitals:  Vitals:   09/25/16 0940 09/25/16 0945  BP: 112/60 (!) 121/54  Pulse:    Resp: (!) 23 16  Temp:      Last Pain:  Vitals:   09/25/16 0908  TempSrc: Oral      Patients Stated Pain Goal: 7 (78/67/54 [id number])  Complications: No apparent anesthesia complications

## 2016-09-25 NOTE — Anesthesia Postprocedure Evaluation (Signed)
Anesthesia Post Note  Patient: Nancy Hodges  Procedure(s) Performed: Procedure(s) (LRB): CATARACT EXTRACTION PHACO AND INTRAOCULAR LENS PLACEMENT (IOC) (Left)  Patient location during evaluation: Short Stay Anesthesia Type: MAC Level of consciousness: awake and alert and oriented Pain management: pain level controlled Vital Signs Assessment: post-procedure vital signs reviewed and stable Respiratory status: spontaneous breathing Cardiovascular status: stable Postop Assessment: no signs of nausea or vomiting Anesthetic complications: no     Last Vitals:  Vitals:   09/25/16 0940 09/25/16 0945  BP: 112/60 (!) 121/54  Pulse:    Resp: (!) 23 16  Temp:      Last Pain:  Vitals:   09/25/16 0908  TempSrc: Oral                 ADAMS, AMY A

## 2016-09-25 NOTE — Anesthesia Procedure Notes (Signed)
Procedure Name: MAC Date/Time: 09/25/2016 9:56 AM Performed by: Andree Elk, Jason Hauge A Pre-anesthesia Checklist: Patient identified, Timeout performed, Emergency Drugs available, Suction available and Patient being monitored Oxygen Delivery Method: Nasal cannula

## 2016-09-25 NOTE — Discharge Instructions (Signed)

## 2016-09-25 NOTE — H&P (Signed)
I have reviewed the H&P, the patient was re-examined, and I have identified no interval changes in medical condition and plan of care since the history and physical of record  

## 2016-09-25 NOTE — Anesthesia Preprocedure Evaluation (Signed)
Anesthesia Evaluation  Patient identified by MRN, date of birth, ID band Patient awake    Reviewed: Allergy & Precautions, NPO status , Patient's Chart, lab work & pertinent test results  Airway Mallampati: II  TM Distance: >3 FB     Dental  (+) Poor Dentition, Edentulous Upper   Pulmonary asthma , COPD, former smoker,  VATS - RU lobectomy for CA    breath sounds clear to auscultation       Cardiovascular  Rhythm:Regular Rate:Normal     Neuro/Psych Anxiety    GI/Hepatic hiatal hernia, GERD  ,(+) Cirrhosis   Esophageal Varices    ,   Endo/Other  diabetes, Type 2  Renal/GU      Musculoskeletal   Abdominal   Peds  Hematology   Anesthesia Other Findings   Reproductive/Obstetrics                             Anesthesia Physical Anesthesia Plan  ASA: IV  Anesthesia Plan: MAC   Post-op Pain Management:    Induction: Intravenous  Airway Management Planned: Nasal Cannula  Additional Equipment:   Intra-op Plan:   Post-operative Plan:   Informed Consent: I have reviewed the patients History and Physical, chart, labs and discussed the procedure including the risks, benefits and alternatives for the proposed anesthesia with the patient or authorized representative who has indicated his/her understanding and acceptance.     Plan Discussed with:   Anesthesia Plan Comments:         Anesthesia Quick Evaluation

## 2016-09-25 NOTE — Op Note (Signed)
Date of Admission: 09/25/2016  Date of Surgery: 09/25/2016  Pre-Op Dx: Cataract Left  Eye  Post-Op Dx: Senile Combined Cataract  Left  Eye,  Dx Code J00.938  Surgeon: Tonny Branch, M.D.  Assistants: None  Anesthesia: Topical with MAC  Indications: Painless, progressive loss of vision with compromise of daily activities.  Surgery: Cataract Extraction with Intraocular lens Implant Left Eye  Discription: The patient had dilating drops and viscous lidocaine placed into the Left eye in the pre-op holding area. After transfer to the operating room, a time out was performed. The patient was then prepped and draped. Beginning with a 58 degree blade a paracentesis port was made at the surgeon's 2 o'clock position. The anterior chamber was then filled with 1% non-preserved lidocaine. This was followed by filling the anterior chamber with Provisc.  A 2.31m keratome blade was used to make a clear corneal incision at the temporal limbus.  A bent cystatome needle was used to create a continuous tear capsulotomy. Hydrodissection was performed with balanced salt solution on a Fine canula. The lens nucleus was then removed using the phacoemulsification handpiece. Residual cortex was removed with the I&A handpiece. The anterior chamber and capsular bag were refilled with Provisc. A posterior chamber intraocular lens was placed into the capsular bag with it's injector. The implant was positioned with the Kuglan hook. The Provisc was then removed from the anterior chamber and capsular bag with the I&A handpiece. Stromal hydration of the main incision and paracentesis port was performed with BSS on a Fine canula. The wounds were tested for leak which was negative. The patient tolerated the procedure well. There were no operative complications. The patient was then transferred to the recovery room in stable condition.  Complications: None  Specimen: None  EBL: None  Prosthetic device: Abbott Technis, PCB00, power  22.5, SN [id number]

## 2016-09-26 ENCOUNTER — Encounter (HOSPITAL_COMMUNITY): Payer: Self-pay | Admitting: Ophthalmology

## 2016-10-07 NOTE — Progress Notes (Signed)
Electrophysiology Office Note Date: 10/09/2016  ID:  Nancy Hodges, Nancy Hodges [birth year], MRN [id number]  PCP: [name], PA-C Electrophysiologist: Kathlee Nations is a 71 y.o. female is seen today for Dr. [name].  She presents today for routine electrophysiology followup as part of research protocol.  Since last being seen in our clinic, the patient reports doing very well.  She denies recent chest pain, shortness of breath, LE edema, syncope, or pre-syncope. She has not had bleeding complications. She has a very low AF burden by symptoms. She has had some claudication symptoms while walking  Device History: STJ Leadless PPM implanted 09/14/12 for tachy-brady syndrome.    Past Medical History:  Diagnosis Date  . Allergic rhinitis   . Anxiety   . Arthritis   . Asthma   . Atrial flutter ([place])   . Cholelithiases 09/26/2015  . Diverticulosis   . GERD (gastroesophageal reflux disease)   . H/O hiatal hernia   . History of kidney stones   . Internal hemorrhoid   . Lung cancer ([place])    a. Stage IB non-small cell carcinoma, s/p R VATS, wedge resection, RU lobectomy 10/2012.  [name] Other specified diabetes mellitus with hyperglycemia ([place])    hx of chemo induced high blood sugar  . Paroxysmal atrial fibrillation ([facility])    a. anticoagulated with Xarelto  . Pneumonia 06/2016   [facility]  . Pulmonary nodule   . Tachycardia-bradycardia syndrome ([place])    a. 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   Past Surgical History:  Procedure Laterality Date  . CATARACT EXTRACTION W/PHACO Right 08/14/2016   Procedure: CATARACT EXTRACTION PHACO AND INTRAOCULAR LENS PLACEMENT RIGHT EYE CDE 10.53;  Surgeon: Tonny Branch, MD;  Location: [facility];  Service: Ophthalmology;  Laterality: Right;  right  . CATARACT EXTRACTION W/PHACO Left 09/25/2016   Procedure: CATARACT EXTRACTION PHACO AND INTRAOCULAR LENS PLACEMENT (IOC);  Surgeon: Tonny Branch, MD;  Location: [facility];  Service: Ophthalmology;   Laterality: Left;  left CDE 8.81  . COLONOSCOPY     [facility]: cattered sigmoid diverticula, internal hemorrhoids, sessile polyp in ascending and mid transverse colon (tubular adenoma).   . colonoscopy with polypectomy  2015  . ESOPHAGOGASTRODUODENOSCOPY     [facility]: bile reflux in stomach, mild gastritis, hiatal hernia  . ESOPHAGOGASTRODUODENOSCOPY N/A 01/31/2016   Procedure: ESOPHAGOGASTRODUODENOSCOPY (EGD);  Surgeon: [name], MD;  [facility]: [facility];  Service: Endoscopy;  Laterality: N/A;  1200  . LOBECTOMY Right 10/27/2012   Procedure: LOBECTOMY;  Surgeon: [name], MD;  Location: [facility];  Service: Thoracic;  Laterality: Right;   RIGHT UPPER LOBECTOMY & Node Dissection  . LYMPH NODE BIOPSY Right 05/01/2014   Procedure: LYMPH NODE BIOPSY, Right Axillary;  Surgeon: [name], MD;  Location: [facility];  Service: Thoracic;  Laterality: Right;  . PARTIAL HYSTERECTOMY    . PERMANENT PACEMAKER INSERTION N/A 09/14/2012   Nanostim (SJM) leadless pacemaker (LEADLESS II STUDY PATIENT) implanted by Dr [name]  . VIDEO ASSISTED THORACOSCOPY (VATS)/WEDGE RESECTION Right 10/27/2012   Procedure: VIDEO ASSISTED THORACOSCOPY (VATS),RGHT UPPER LOBE LUNG WEDGE RESECTION;  Surgeon: [name], MD;  Location: [facility];  Service: Thoracic;  Laterality: Right;    Current Outpatient Prescriptions  Medication Sig Dispense Refill  . acetaminophen (TYLENOL) 500 MG tablet Take 500 mg by mouth every 6 (six) hours as needed for moderate pain or fever.     [name] albuterol (PROVENTIL HFA;VENTOLIN HFA) 108 (90 BASE) MCG/ACT inhaler Inhale 1-2  puffs into the lungs every 6 (six) hours as needed for wheezing or shortness of breath.    . BESIVANCE 0.6 % SUSP INSTILL ONE DROP IN SURGICAL EYE TID BEGINNING 3 DAYS PRIOR TO SURGERY AND CONTINUE FOR 1 WEEK AFTER SURGERY  1  . diltiazem (CARDIZEM CD) 240 MG 24 hr capsule Take 1 capsule (240 mg total) by mouth daily. 30 capsule 11  . fexofenadine  (ALLEGRA) 180 MG tablet Take 180 mg by mouth daily.    [name] ipratropium-albuterol (DUONEB) 0.5-2.5 (3) MG/3ML SOLN Take 3 mLs by nebulization every 6 (six) hours as needed (shortness of breath or wheezing).     [name] ketorolac (ACULAR) 0.5 % ophthalmic solution   1  . meclizine (ANTIVERT) 12.5 MG tablet Take 12.5 mg by mouth 3 (three) times daily as needed for dizziness.    . rivaroxaban (XARELTO) 20 MG TABS tablet Take 1 tablet (20 mg total) by mouth daily with supper. 30 tablet 11   No current facility-administered medications for this visit.     Allergies:   Prednisone; Septra [sulfamethoxazole-trimethoprim]; Penicillins; and Vioxx [rofecoxib]   Social History: Social History   Social History  . Marital status: Single    Spouse name: N/A  . Number of children: N/A  . Years of education: N/A   Occupational History  . Not on file.   Social History Main Topics  . Smoking status: Former Smoker    Packs/day: 2.00    Years: 40.00    Types: Cigarettes    Quit date: 08/05/1995  . Smokeless tobacco: Never Used  . Alcohol use No  . Drug use: No  . Sexual activity: Not Currently   Other Topics Concern  . Not on file   Social History Narrative   Patient lives alone in [place].   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   Patient has 1 living daughter     Family History: Family History  Problem Relation Age of Onset  . Heart disease Mother   . Diabetes Mother   . Kidney cancer Mother   . Asthma Father   . Heart disease Father   . Pancreatic cancer Brother   . Diabetes Sister   . Lung cancer Sister   . Diabetes Brother   . Heart attack Son   . Colon cancer Neg Hx     Review of Systems: All other systems reviewed and are otherwise negative except as noted above.   Physical Exam: VS:  BP 140/68   Pulse 82   Ht '5\' 2"'$  (1.575 m)   Wt 145 lb 12.8 oz (66.1 kg)   BMI 26.67 kg/m  , BMI Body mass index is 26.67 kg/m.  GEN- The patient is elderly appearing, alert and  oriented x 3 today.   HEENT: normocephalic, atraumatic; sclera clear, conjunctiva pink; hearing intact; oropharynx clear; neck supple Lungs- Clear to ausculation bilaterally, decreased right sided breath sounds, normal work of breathing.  No wheezes, rales, rhonchi Heart- Regular rate and rhythm, no murmurs, rubs or gallops GI- soft, non-tender, non-distended, bowel sounds present Extremities- no clubbing, cyanosis, or edema; DP/PT/radial pulses 1+ bilaterally MS- no significant deformity or atrophy Skin- warm and dry, multiple bruises Psych- euthymic mood, full affect Neuro- strength and sensation are intact  PPMinterrogation- reviewed in detail today,  See PACEART report  EKG:  EKG is not ordered today.  Recent Labs: 08/26/2016: ALT 29; BUN 11.4; Creatinine 0.7; HGB 13.5; Platelets 192; Potassium 5.0; Sodium 142   Wt Readings from  Last 3 Encounters:  10/09/16 145 lb 12.8 oz (66.1 kg)  09/25/16 142 lb (64.4 kg)  09/02/16 142 lb 4.8 oz (64.5 kg)     Assessment and Plan:  1.  Tachy/Brady Normal PPM function See Pace Art report No changes today Pt aware of advisory on pacemaker. Discussed the recommendation from [facility] is to not replace at this time. Follow per research recommendations.   2.  Paroxysmal atrial fibrillation Predominately maintaining SR per symptoms.  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 Will follow for now If she has recurrent symptomatic AF, could use Sotalol or Tikosyn  Continue xarelto for [facility] of [facility]  Current medicines are reviewed at length with the patient today.   The patient does not have concerns regarding her medicines.  The following changes were made today:  none  Labs/ tests ordered today include:  none  Disposition:   FU with Dr [name] 6 months   Signed, Chanetta Marshall, NP 10/09/2016 10:55 AM  [place] [address] [phone] (office) [phone] (fax)

## 2016-10-09 ENCOUNTER — Encounter: Payer: Self-pay | Admitting: Nurse Practitioner

## 2016-10-09 ENCOUNTER — Telehealth: Payer: Self-pay | Admitting: Internal Medicine

## 2016-10-09 ENCOUNTER — Ambulatory Visit (INDEPENDENT_AMBULATORY_CARE_PROVIDER_SITE_OTHER): Payer: Medicare Other | Admitting: Nurse Practitioner

## 2016-10-09 VITALS — BP 140/68 | HR 82 | Ht 62.0 in | Wt 145.8 lb

## 2016-10-09 DIAGNOSIS — I495 Sick sinus syndrome: Secondary | ICD-10-CM

## 2016-10-09 DIAGNOSIS — I480 Paroxysmal atrial fibrillation: Secondary | ICD-10-CM

## 2016-10-09 LAB — CUP PACEART INCLINIC DEVICE CHECK
Date Time Interrogation Session: 20180308105758
MDC IDC PG IMPLANT DT: 20140211
Pulse Gen Serial Number: 1095

## 2016-10-09 NOTE — Telephone Encounter (Signed)
New Message  Pt voiced she call the number on paperwork which was received.  Pt voiced they informed pt Chanetta Marshall would have to call...[phone] and fax [phone]  Please f/u

## 2016-10-09 NOTE — Telephone Encounter (Signed)
Nancy Marshall, NP wrote an order for patient to have her nocturnal O2 SAT checked and if >90% may d/c home O2 at night.  She is currently not wearing the O2 at night.  I will fax   Please check nocturnal oxygen SAT and if > 90 % may discontinue home O2.  Spoke with Raven at [facility] and will fax over to her. Patient aware faxing.  Patient has order with her

## 2016-10-09 NOTE — Patient Instructions (Addendum)
Medication Instructions:   Your physician recommends that you continue on your current medications as directed. Please refer to the Current Medication list given to you today.    If you need a refill on your cardiac medications before your next appointment, please call your pharmacy.  Labwork: NONE ORDERED  TODAY    Testing/Procedures: NONE ORDERED  TODAY    Follow-Up:  Your physician wants you to follow-up in:  IN  6 MONTHS WITH DR [name]   You will receive a reminder letter in the mail two months in advance. If you don't receive a letter, please call our office to schedule the follow-up appointment.      Any Other Special Instructions Will Be Listed Below (If Applicable).

## 2016-11-05 ENCOUNTER — Other Ambulatory Visit: Payer: Self-pay | Admitting: Physician Assistant

## 2016-11-05 ENCOUNTER — Telehealth: Payer: Self-pay | Admitting: Medical Oncology

## 2016-11-05 DIAGNOSIS — Z853 Personal history of malignant neoplasm of breast: Secondary | ICD-10-CM

## 2016-11-05 DIAGNOSIS — R928 Other abnormal and inconclusive findings on diagnostic imaging of breast: Secondary | ICD-10-CM

## 2016-11-05 NOTE — Telephone Encounter (Signed)
I left a message for pt to [facility]

## 2016-11-19 ENCOUNTER — Telehealth: Payer: Self-pay | Admitting: *Deleted

## 2016-11-19 ENCOUNTER — Ambulatory Visit
Admission: RE | Admit: 2016-11-19 | Discharge: 2016-11-19 | Disposition: A | Discharge status: Home / Self Care | Payer: Medicare Other | Source: Ambulatory Visit | Attending: Physician Assistant | Admitting: Physician Assistant

## 2016-11-19 ENCOUNTER — Other Ambulatory Visit: Payer: Medicare Other

## 2016-11-19 DIAGNOSIS — R928 Other abnormal and inconclusive findings on diagnostic imaging of breast: Secondary | ICD-10-CM

## 2016-11-19 DIAGNOSIS — Z853 Personal history of malignant neoplasm of breast: Secondary | ICD-10-CM

## 2016-11-19 NOTE — Telephone Encounter (Signed)
Oncology Nurse Navigator Documentation  Oncology Nurse Navigator Flowsheets 11/19/2016  Navigator Location [facility]  Navigator Encounter Type Telephone/I called and left Ms. Barabas a vm message to call me. I wanted to update her on mammogram.  Impression on mammogram "no evidence of malignancy".  Dr. Julien Nordmann aware.    Telephone Outgoing Call  Treatment Phase Abnormal Scans  Barriers/Navigation Needs Coordination of Care  Interventions Coordination of Care  Coordination of Care Other  Acuity Level 2  Time Spent with Patient 30

## 2016-11-25 ENCOUNTER — Telehealth: Payer: Self-pay | Admitting: *Deleted

## 2016-11-25 NOTE — Telephone Encounter (Signed)
Oncology Nurse Navigator Documentation  Oncology Nurse Navigator Flowsheets 11/25/2016  Navigator Location [facility]  Navigator Encounter Type Telephone/I called Ms. Dantes to follow up on her mammogram.  Per Dr. Worthy Flank ok, I updated her mammogram was ok and she needs yearly evaluation. She was thankful for the call and will be seeing Dr. Julien Nordmann in May.  I verified appt with Ms. Bechtol.  She is aware.    Telephone Outgoing Call  Treatment Phase Follow-up  Barriers/Navigation Needs Education  Education Other  Interventions Education  Education Method Verbal  Acuity Level 2  Time Spent with Patient 30

## 2016-12-01 ENCOUNTER — Other Ambulatory Visit (HOSPITAL_BASED_OUTPATIENT_CLINIC_OR_DEPARTMENT_OTHER): Payer: Medicare Other

## 2016-12-01 ENCOUNTER — Ambulatory Visit (HOSPITAL_COMMUNITY)
Admission: RE | Admit: 2016-12-01 | Discharge: 2016-12-01 | Disposition: A | Discharge status: Home / Self Care | Payer: Medicare Other | Source: Ambulatory Visit | Attending: Internal Medicine | Admitting: Internal Medicine

## 2016-12-01 ENCOUNTER — Encounter (HOSPITAL_COMMUNITY): Payer: Self-pay

## 2016-12-01 DIAGNOSIS — R918 Other nonspecific abnormal finding of lung field: Secondary | ICD-10-CM | POA: Insufficient documentation

## 2016-12-01 DIAGNOSIS — I2510 Atherosclerotic heart disease of native coronary artery without angina pectoris: Secondary | ICD-10-CM | POA: Insufficient documentation

## 2016-12-01 DIAGNOSIS — C773 Secondary and unspecified malignant neoplasm of axilla and upper limb lymph nodes: Secondary | ICD-10-CM | POA: Diagnosis not present

## 2016-12-01 DIAGNOSIS — C3491 Malignant neoplasm of unspecified part of right bronchus or lung: Secondary | ICD-10-CM | POA: Diagnosis present

## 2016-12-01 DIAGNOSIS — I700 Atherosclerosis of aorta: Secondary | ICD-10-CM | POA: Diagnosis not present

## 2016-12-01 DIAGNOSIS — C7800 Secondary malignant neoplasm of unspecified lung: Secondary | ICD-10-CM | POA: Insufficient documentation

## 2016-12-01 DIAGNOSIS — J449 Chronic obstructive pulmonary disease, unspecified: Secondary | ICD-10-CM | POA: Insufficient documentation

## 2016-12-01 DIAGNOSIS — C3411 Malignant neoplasm of upper lobe, right bronchus or lung: Secondary | ICD-10-CM

## 2016-12-01 LAB — CBC WITH DIFFERENTIAL/PLATELET
BASO%: 0.2 % (ref 0.0–2.0)
Basophils Absolute: 0 10*3/uL (ref 0.0–0.1)
EOS%: 1.9 % (ref 0.0–7.0)
Eosinophils Absolute: 0.1 10*3/uL (ref 0.0–0.5)
HEMATOCRIT: 38.8 % (ref 34.8–46.6)
HGB: 13.2 g/dL (ref 11.6–15.9)
LYMPH%: 19.7 % (ref 14.0–49.7)
MCH: 33.2 pg (ref 25.1–34.0)
MCHC: 34 g/dL (ref 31.5–36.0)
MCV: 97.5 fL (ref 79.5–101.0)
MONO#: 0.3 10*3/uL (ref 0.1–0.9)
MONO%: 7.1 % (ref 0.0–14.0)
NEUT#: 3.4 10*3/uL (ref 1.5–6.5)
NEUT%: 71.1 % (ref 38.4–76.8)
Platelets: 145 10*3/uL (ref 145–400)
RBC: 3.98 10*6/uL (ref 3.70–5.45)
RDW: 13.8 % (ref 11.2–14.5)
WBC: 4.8 10*3/uL (ref 3.9–10.3)
lymph#: 0.9 10*3/uL (ref 0.9–3.3)

## 2016-12-01 LAB — COMPREHENSIVE METABOLIC PANEL
ALT: 30 U/L (ref 0–55)
AST: 39 U/L — AB (ref 5–34)
Albumin: 4.1 g/dL (ref 3.5–5.0)
Alkaline Phosphatase: 137 U/L (ref 40–150)
Anion Gap: 8 mEq/L (ref 3–11)
BUN: 13.9 mg/dL (ref 7.0–26.0)
CALCIUM: 9.8 mg/dL (ref 8.4–10.4)
CHLORIDE: 105 meq/L (ref 98–109)
CO2: 27 meq/L (ref 22–29)
CREATININE: 0.8 mg/dL (ref 0.6–1.1)
EGFR: 79 mL/min/{1.73_m2} — ABNORMAL LOW (ref >90)
Glucose: 115 mg/dl (ref 70–140)
Potassium: 4.2 mEq/L (ref 3.5–5.1)
Sodium: 140 mEq/L (ref 136–145)
Total Bilirubin: 1.91 mg/dL — ABNORMAL HIGH (ref 0.20–1.20)
Total Protein: 7.2 g/dL (ref 6.4–8.3)

## 2016-12-01 MED ORDER — IOPAMIDOL (ISOVUE-300) INJECTION 61%
30.0000 mL | Freq: Once | INTRAVENOUS | Status: AC | PRN
  Administered 2016-12-01: 30 mL via ORAL

## 2016-12-01 MED ORDER — IOPAMIDOL (ISOVUE-300) INJECTION 61%
INTRAVENOUS | Status: AC
  Administered 2016-12-01: 30 mL via ORAL
  Filled 2016-12-01: qty 30

## 2016-12-09 ENCOUNTER — Ambulatory Visit (HOSPITAL_BASED_OUTPATIENT_CLINIC_OR_DEPARTMENT_OTHER): Payer: Medicare Other | Admitting: Internal Medicine

## 2016-12-09 ENCOUNTER — Encounter: Payer: Self-pay | Admitting: Internal Medicine

## 2016-12-09 ENCOUNTER — Ambulatory Visit: Payer: Medicare Other | Admitting: Internal Medicine

## 2016-12-09 ENCOUNTER — Telehealth: Payer: Self-pay | Admitting: Internal Medicine

## 2016-12-09 VITALS — BP 144/52 | HR 68 | Temp 97.8°F | Resp 19 | Ht 62.0 in | Wt 146.8 lb

## 2016-12-09 DIAGNOSIS — C3491 Malignant neoplasm of unspecified part of right bronchus or lung: Secondary | ICD-10-CM

## 2016-12-09 DIAGNOSIS — I10 Essential (primary) hypertension: Secondary | ICD-10-CM

## 2016-12-09 DIAGNOSIS — C3411 Malignant neoplasm of upper lobe, right bronchus or lung: Secondary | ICD-10-CM | POA: Diagnosis not present

## 2016-12-09 DIAGNOSIS — R5382 Chronic fatigue, unspecified: Secondary | ICD-10-CM

## 2016-12-09 DIAGNOSIS — C773 Secondary and unspecified malignant neoplasm of axilla and upper limb lymph nodes: Secondary | ICD-10-CM

## 2016-12-09 DIAGNOSIS — K7469 Other cirrhosis of liver: Secondary | ICD-10-CM

## 2016-12-09 DIAGNOSIS — Z7189 Other specified counseling: Secondary | ICD-10-CM

## 2016-12-09 DIAGNOSIS — Z5112 Encounter for antineoplastic immunotherapy: Secondary | ICD-10-CM | POA: Insufficient documentation

## 2016-12-09 HISTORY — DX: Encounter for antineoplastic immunotherapy: Z51.12

## 2016-12-09 NOTE — Progress Notes (Signed)
START ON PATHWAY REGIMEN - Non-Small Cell Lung     A cycle is every 14 days:     Nivolumab   **Always confirm dose/schedule in your pharmacy ordering system**    Patient Characteristics: Stage IV Metastatic, Non Squamous, Second Line - Chemotherapy/Immunotherapy, PS = 0, 1, No Prior PD-1/PD-L1  Inhibitor and Immunotherapy Candidate AJCC T Category: T2a Current Disease Status: Distant Metastases AJCC N Category: N2 AJCC M Category: M1a AJCC 8 Stage Grouping: IVA Histology: Non Squamous Cell ROS1 Rearrangement Status: Quantity Not Sufficient T790M Mutation Status: Not Applicable - EGFR Mutation Negative/Unknown Other Mutations/Biomarkers: No Other Actionable Mutations PD-L1 Expression Status: Quantity Not Sufficient Chemotherapy/Immunotherapy LOT: Second Line Chemotherapy/Immunotherapy Molecular Targeted Therapy: Not Appropriate ALK Translocation Status: Quantity Not Sufficient Would you be surprised if this patient died  in the next year? I would NOT be surprised if this patient died in the next year EGFR Mutation Status: Quantity Not Sufficient BRAF V600E Mutation Status: Quantity Not Sufficient Performance Status: PS = 0, 1 Immunotherapy Candidate Status: Candidate for Immunotherapy Prior Immunotherapy Status: No Prior PD-1/PD-L1 Inhibitor  Intent of Therapy: Non-Curative / Palliative Intent, Discussed with Patient

## 2016-12-09 NOTE — Telephone Encounter (Signed)
Appointments scheduled per 12/09/16 LOS. Patient given AVS report and calendars with future scheduled appointments.

## 2016-12-09 NOTE — Progress Notes (Signed)
[place] [phone]   [phone]  OFFICE PROGRESS NOTE  [name] [address] [place] [address]  DIAGNOSIS: Metastatic non-small cell lung cancer, adenocarcinoma. This was initially diagnosed as stage IB (T2a, N0, M0) In March of 2014, status post right upper lobectomy with lymph node dissection but the patient has evidence for disease recurrence in the right axilla status post resection.  PRIOR THERAPY:  1) Status post right axillary lymph node biopsy on 05/01/2014 under the care of Dr. Roxan Hockey. 2) Systemic chemotherapy with carboplatin for AUC of 5 and Alimta 500 mg/M2 every 3 weeks. First cycle 05/31/2014. Status post 6 cycles. Last dose was given 09/22/2014. 3) palliative radiotherapy to the subcarinal lymph node under the care of Dr. Sondra Come  CURRENT THERAPY:  Nivolumab 240 mg IV every 2 weeks. First dose 12/23/2016.  INTERVAL HISTORY: Nancy Hodges 71 y.o. female returns to the clinic today for follow-up visit accompanied by her friend. The patient is feeling fine today with no specific complaints. She denied having any chest pain, shortness of breath, cough or hemoptysis. She has no weight loss or night sweats. She has no nausea, vomiting, diarrhea or constipation. The patient has been on observation. She had repeat CT scan of the chest, abdomen and pelvis performed recently and she is here for evaluation and discussion of her scan results and recommendation regarding treatment of her condition.   MEDICAL HISTORY: Past Medical History:  Diagnosis Date  . Allergic rhinitis   . Anxiety   . Arthritis   . Asthma   . Atrial flutter ([place])   . Cholelithiases 09/26/2015  . Diverticulosis   . GERD (gastroesophageal reflux disease)   . H/O hiatal hernia   . History of kidney stones   . Internal hemorrhoid   . Lung cancer ([place])    a. Stage IB non-small cell carcinoma, s/p R VATS, wedge resection, RU lobectomy  10/2012.  [name] Other specified diabetes mellitus with hyperglycemia ([place])    hx of chemo induced high blood sugar  . Paroxysmal atrial fibrillation ([facility])    a. anticoagulated with Xarelto  . Pneumonia 06/2016   [facility]  . Pulmonary nodule   . Tachycardia-bradycardia syndrome ([place])    a. 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    ALLERGIES:  is allergic to prednisone; septra [sulfamethoxazole-trimethoprim]; penicillins; and vioxx [rofecoxib].  MEDICATIONS:  Current Outpatient Prescriptions  Medication Sig Dispense Refill  . albuterol (PROVENTIL HFA;VENTOLIN HFA) 108 (90 BASE) MCG/ACT inhaler Inhale 1-2 puffs into the lungs every 6 (six) hours as needed for wheezing or shortness of breath.    . diltiazem (CARDIZEM CD) 240 MG 24 hr capsule Take 1 capsule (240 mg total) by mouth daily. 30 capsule 11  . fexofenadine (ALLEGRA) 180 MG tablet Take 180 mg by mouth daily.    [name] ipratropium-albuterol (DUONEB) 0.5-2.5 (3) MG/3ML SOLN Take 3 mLs by nebulization every 6 (six) hours as needed (shortness of breath or wheezing).     . meclizine (ANTIVERT) 12.5 MG tablet Take 12.5 mg by mouth 3 (three) times daily as needed for dizziness.    . rivaroxaban (XARELTO) 20 MG TABS tablet Take 1 tablet (20 mg total) by mouth daily with supper. 30 tablet 11  . acetaminophen (TYLENOL) 500 MG tablet Take 500 mg by mouth every 6 (six) hours as needed for moderate pain or fever.      No current facility-administered medications for this visit.  SURGICAL HISTORY:  Past Surgical History:  Procedure Laterality Date  . CATARACT EXTRACTION W/PHACO Right 08/14/2016   Procedure: CATARACT EXTRACTION PHACO AND INTRAOCULAR LENS PLACEMENT RIGHT EYE CDE 10.53;  Surgeon: Tonny Branch, MD;  Location: [facility];  Service: Ophthalmology;  Laterality: Right;  right  . CATARACT EXTRACTION W/PHACO Left 09/25/2016   Procedure: CATARACT EXTRACTION PHACO AND INTRAOCULAR LENS PLACEMENT (IOC);  Surgeon: Tonny Branch, MD;  Location: [facility];  Service: Ophthalmology;  Laterality: Left;  left CDE 8.81  . COLONOSCOPY     [facility]: cattered sigmoid diverticula, internal hemorrhoids, sessile polyp in ascending and mid transverse colon (tubular adenoma).   . colonoscopy with polypectomy  2015  . ESOPHAGOGASTRODUODENOSCOPY     [facility]: bile reflux in stomach, mild gastritis, hiatal hernia  . ESOPHAGOGASTRODUODENOSCOPY N/A 01/31/2016   Procedure: ESOPHAGOGASTRODUODENOSCOPY (EGD);  Surgeon: [name], MD;  Location: [facility];  Service: Endoscopy;  Laterality: N/A;  1200  . LOBECTOMY Right 10/27/2012   Procedure: LOBECTOMY;  Surgeon: [name], MD;  Location: [facility];  Service: Thoracic;  Laterality: Right;   RIGHT UPPER LOBECTOMY & Node Dissection  . LYMPH NODE BIOPSY Right 05/01/2014   Procedure: LYMPH NODE BIOPSY, Right Axillary;  Surgeon: [name], MD;  Location: [facility];  Service: Thoracic;  Laterality: Right;  . PARTIAL HYSTERECTOMY    . PERMANENT PACEMAKER INSERTION N/A 09/14/2012   Nanostim (SJM) leadless pacemaker (LEADLESS II STUDY PATIENT) implanted by Dr [name]  . VIDEO ASSISTED THORACOSCOPY (VATS)/WEDGE RESECTION Right 10/27/2012   Procedure: VIDEO ASSISTED THORACOSCOPY (VATS),RGHT UPPER LOBE LUNG WEDGE RESECTION;  Surgeon: [name], MD;  Location: [facility];  Service: Thoracic;  Laterality: Right;    REVIEW OF SYSTEMS:  Constitutional: negative Eyes: negative Ears, nose, mouth, throat, and face: negative Respiratory: negative Cardiovascular: negative Gastrointestinal: negative Genitourinary:negative Integument/breast: negative Hematologic/lymphatic: negative Musculoskeletal:negative Neurological: negative Behavioral/Psych: negative Endocrine: negative Allergic/Immunologic: negative   PHYSICAL EXAMINATION: General appearance: alert, cooperative and no distress Head: Normocephalic, without obvious abnormality, atraumatic Neck: no adenopathy, no JVD, supple, symmetrical, trachea  midline and thyroid not enlarged, symmetric, no tenderness/mass/nodules Lymph nodes: Cervical, supraclavicular, and axillary nodes normal. Resp: clear to auscultation bilaterally Back: symmetric, no curvature. ROM normal. No CVA tenderness. Cardio: regular rate and rhythm, S1, S2 normal, no murmur, click, rub or gallop GI: soft, non-tender; bowel sounds normal; no masses,  no organomegaly Extremities: extremities normal, atraumatic, no cyanosis or edema Neurologic: Alert and oriented X 3, normal strength and tone. Normal symmetric reflexes. Normal coordination and gait  ECOG PERFORMANCE STATUS: 0 - Asymptomatic  Blood pressure (!) 144/52, pulse 68, temperature 97.8 F (36.6 C), temperature source Oral, resp. rate 19, height '5\' 2"'$  (1.575 m), weight 146 lb 12.8 oz (66.6 kg), SpO2 100 %.  LABORATORY DATA: Lab Results  Component Value Date   WBC 4.8 12/01/2016   HGB 13.2 12/01/2016   HCT 38.8 12/01/2016   MCV 97.5 12/01/2016   PLT 145 12/01/2016      Chemistry      Component Value Date/Time   NA 140 12/01/2016 0937   K 4.2 12/01/2016 0937   CL 107 08/12/2016 0937   CO2 27 12/01/2016 0937   BUN 13.9 12/01/2016 0937   CREATININE 0.8 12/01/2016 0937      Component Value Date/Time   CALCIUM 9.8 12/01/2016 0937   ALKPHOS 137 12/01/2016 0937   AST 39 (H) 12/01/2016 0937   ALT 30 12/01/2016 0937   BILITOT 1.91 (H) 12/01/2016 5176  RADIOGRAPHIC STUDIES: Ct Abdomen Pelvis Wo Contrast  Result Date: 12/01/2016 CLINICAL DATA:  71 year old female with history of stage IV non-small cell right-sided lung cancer originally diagnosed in 2013 status post right lobe wedge resection followed by chemotherapy and radiation therapy completed in October 2017. EXAM: CT CHEST, ABDOMEN AND PELVIS WITHOUT CONTRAST TECHNIQUE: Multidetector CT imaging of the chest, abdomen and pelvis was performed following the standard protocol without IV contrast. COMPARISON:  CT the chest, abdomen and pelvis  08/26/2016. FINDINGS: CT CHEST FINDINGS Cardiovascular: Heart size is normal. There is no significant pericardial fluid, thickening or pericardial calcification. There is aortic atherosclerosis, as well as atherosclerosis of the great vessels of the mediastinum and the coronary arteries, including calcified atherosclerotic plaque in the left anterior descending, left circumflex and right coronary arteries. Calcifications of the mitral valve. Electronic device in the right ventricle, presumably a leadless pacemaker device. Mediastinum/Nodes: No pathologically enlarged mediastinal or hilar lymph nodes. Please note that accurate exclusion of hilar adenopathy is limited on noncontrast CT scans. Esophagus is unremarkable in appearance. No axillary lymphadenopathy. Lungs/Pleura: Again noted are multiple pulmonary nodules scattered throughout the lungs bilaterally. The overall number of pulmonary nodules appears unchanged, however, many of the nodules have grown substantially compared to the prior study. This is best demonstrated by the largest pulmonary nodules in the lungs, which include a left upper lobe pulmonary nodule (image 81 of series 4) which currently measures 12 x 15 mm (previously 10 x 9 mm on 08/26/2016), and a right lower lobe pulmonary nodule (image 112 of series 4) which currently measures 10 x 12 mm (previously 7 x 5 mm on 08/26/2016). No acute consolidative airspace disease. No pleural effusions. Status post right upper lobectomy. Compensatory hyperexpansion of the right middle and lower lobes. Areas of architectural distortion are again noted in the periphery of the right lung, most compatible with areas of chronic postoperative scarring. Mild diffuse bronchial wall thickening with very mild centrilobular emphysema. Musculoskeletal: Surgical clips in the right axilla, suggesting prior lymph node dissection. There are no aggressive appearing lytic or blastic lesions noted in the visualized portions of  the skeleton. CT ABDOMEN PELVIS FINDINGS Hepatobiliary: Liver has a shrunken appearance and nodular contour, again compatible with underlying cirrhosis. Several small calcified granulomas are noted in the liver. No other definite suspicious cystic or solid hepatic lesions are noted on today's noncontrast CT examination. Multiple calcified gallstones lie dependently in the gallbladder. No evidence to suggest an acute cholecystitis at this time. Pancreas: No definite pancreatic mass or peripancreatic inflammatory changes are noted on today's noncontrast CT examination. Spleen: Spleen is enlarged measuring 15.1 x 5.4 x 10.5 cm (estimated splenic volume of 428 mL). Small splenule inferior to the spleen. Adrenals/Urinary Tract: Unenhanced appearance of the kidneys and bilateral adrenal glands is normal. No hydroureteronephrosis. Urinary bladder is normal in appearance. Stomach/Bowel: Unenhanced appearance of the stomach is normal. No pathologic dilatation of small bowel or colon. Numerous colonic diverticulae are noted, without surrounding inflammatory changes to suggest an acute diverticulitis at this time. Normal appendix. Vascular/Lymphatic: Aortic atherosclerosis, without definite aneurysm in the abdominal or pelvic vasculature. Portal vein is mildly dilated measuring 16 mm in diameter. No lymphadenopathy noted in the abdomen or pelvis. Reproductive: Status post hysterectomy. Ovaries are not confidently identified may be surgically absent or atrophic. Other: No significant volume of ascites.  No pneumoperitoneum. Musculoskeletal: There are no aggressive appearing lytic or blastic lesions noted in the visualized portions of the skeleton. IMPRESSION: 1. Today's study demonstrates progressive metastatic  disease in the lungs, as evidenced by increased size of numerous previously noted pulmonary nodules, as detailed above. There are no new pulmonary nodules, and no other new sites of metastatic disease noted elsewhere in  the abdomen or pelvis. 2. Stigmata of cirrhosis and portal hypertension redemonstrated, including splenomegaly, as detailed above. 3. Aortic atherosclerosis, in addition to three vessel coronary artery disease. Please note that although the presence of coronary artery calcium documents the presence of coronary artery disease, the severity of this disease and any potential stenosis cannot be assessed on this non-gated CT examination. Assessment for potential risk factor modification, dietary therapy or pharmacologic therapy may be warranted, if clinically indicated. 4. Additional incidental findings, as above. Electronically Signed   By: Vinnie Langton M.D.   On: 12/01/2016 13:44   Ct Chest Wo Contrast  Result Date: 12/01/2016 CLINICAL DATA:  71 year old female with history of stage IV non-small cell right-sided lung cancer originally diagnosed in 2013 status post right lobe wedge resection followed by chemotherapy and radiation therapy completed in October 2017. EXAM: CT CHEST, ABDOMEN AND PELVIS WITHOUT CONTRAST TECHNIQUE: Multidetector CT imaging of the chest, abdomen and pelvis was performed following the standard protocol without IV contrast. COMPARISON:  CT the chest, abdomen and pelvis 08/26/2016. FINDINGS: CT CHEST FINDINGS Cardiovascular: Heart size is normal. There is no significant pericardial fluid, thickening or pericardial calcification. There is aortic atherosclerosis, as well as atherosclerosis of the great vessels of the mediastinum and the coronary arteries, including calcified atherosclerotic plaque in the left anterior descending, left circumflex and right coronary arteries. Calcifications of the mitral valve. Electronic device in the right ventricle, presumably a leadless pacemaker device. Mediastinum/Nodes: No pathologically enlarged mediastinal or hilar lymph nodes. Please note that accurate exclusion of hilar adenopathy is limited on noncontrast CT scans. Esophagus is unremarkable in  appearance. No axillary lymphadenopathy. Lungs/Pleura: Again noted are multiple pulmonary nodules scattered throughout the lungs bilaterally. The overall number of pulmonary nodules appears unchanged, however, many of the nodules have grown substantially compared to the prior study. This is best demonstrated by the largest pulmonary nodules in the lungs, which include a left upper lobe pulmonary nodule (image 81 of series 4) which currently measures 12 x 15 mm (previously 10 x 9 mm on 08/26/2016), and a right lower lobe pulmonary nodule (image 112 of series 4) which currently measures 10 x 12 mm (previously 7 x 5 mm on 08/26/2016). No acute consolidative airspace disease. No pleural effusions. Status post right upper lobectomy. Compensatory hyperexpansion of the right middle and lower lobes. Areas of architectural distortion are again noted in the periphery of the right lung, most compatible with areas of chronic postoperative scarring. Mild diffuse bronchial wall thickening with very mild centrilobular emphysema. Musculoskeletal: Surgical clips in the right axilla, suggesting prior lymph node dissection. There are no aggressive appearing lytic or blastic lesions noted in the visualized portions of the skeleton. CT ABDOMEN PELVIS FINDINGS Hepatobiliary: Liver has a shrunken appearance and nodular contour, again compatible with underlying cirrhosis. Several small calcified granulomas are noted in the liver. No other definite suspicious cystic or solid hepatic lesions are noted on today's noncontrast CT examination. Multiple calcified gallstones lie dependently in the gallbladder. No evidence to suggest an acute cholecystitis at this time. Pancreas: No definite pancreatic mass or peripancreatic inflammatory changes are noted on today's noncontrast CT examination. Spleen: Spleen is enlarged measuring 15.1 x 5.4 x 10.5 cm (estimated splenic volume of 428 mL). Small splenule inferior to the spleen. Adrenals/Urinary  Tract: Unenhanced appearance of the kidneys and bilateral adrenal glands is normal. No hydroureteronephrosis. Urinary bladder is normal in appearance. Stomach/Bowel: Unenhanced appearance of the stomach is normal. No pathologic dilatation of small bowel or colon. Numerous colonic diverticulae are noted, without surrounding inflammatory changes to suggest an acute diverticulitis at this time. Normal appendix. Vascular/Lymphatic: Aortic atherosclerosis, without definite aneurysm in the abdominal or pelvic vasculature. Portal vein is mildly dilated measuring 16 mm in diameter. No lymphadenopathy noted in the abdomen or pelvis. Reproductive: Status post hysterectomy. Ovaries are not confidently identified may be surgically absent or atrophic. Other: No significant volume of ascites.  No pneumoperitoneum. Musculoskeletal: There are no aggressive appearing lytic or blastic lesions noted in the visualized portions of the skeleton. IMPRESSION: 1. Today's study demonstrates progressive metastatic disease in the lungs, as evidenced by increased size of numerous previously noted pulmonary nodules, as detailed above. There are no new pulmonary nodules, and no other new sites of metastatic disease noted elsewhere in the abdomen or pelvis. 2. Stigmata of cirrhosis and portal hypertension redemonstrated, including splenomegaly, as detailed above. 3. Aortic atherosclerosis, in addition to three vessel coronary artery disease. Please note that although the presence of coronary artery calcium documents the presence of coronary artery disease, the severity of this disease and any potential stenosis cannot be assessed on this non-gated CT examination. Assessment for potential risk factor modification, dietary therapy or pharmacologic therapy may be warranted, if clinically indicated. 4. Additional incidental findings, as above. Electronically Signed   By: Vinnie Langton M.D.   On: 12/01/2016 13:44   Mm Diag Breast Tomo Uni  Right  Result Date: 11/19/2016 CLINICAL DATA:  Patient presents today to evaluate a possible right breast asymmetry questioned on a recent outside screening mammogram. EXAM: 2D DIGITAL DIAGNOSTIC UNILATERAL RIGHT MAMMOGRAM WITH CAD AND ADJUNCT TOMO COMPARISON:  Previous exams including recent outside screening mammogram dated 10/20/2016. ACR Breast Density Category b: There are scattered areas of fibroglandular density. FINDINGS: On today's additional views with 3D tomosynthesis, there is no persistent asymmetry within the right breast indicating superimposition of normal fibroglandular tissues. There are no new dominant masses, suspicious calcifications or secondary signs of malignancy within the right breast on today's exam. Mammographic images were processed with CAD. IMPRESSION: No evidence of malignancy. Patient may return to routine annual bilateral screening mammogram schedule. RECOMMENDATION: Screening mammogram in one year.(Code:[id number]-[id number]) I have discussed the findings and recommendations with the patient. Results were also provided in writing at the conclusion of the visit. If applicable, a reminder letter will be sent to the patient regarding the next appointment. BI-RADS CATEGORY  1: Negative. Electronically Signed   By: Franki Cabot M.D.   On: 11/19/2016 10:53   ASSESSMENT AND PLAN:  This is a very pleasant 71 years old white female with metastatic non-small cell lung cancer, adenocarcinoma with no actionable mutations. The patient is status post systemic chemotherapy with carboplatin and Alimta in addition to palliative radiotherapy to the mediastinal lymph nodes. She has been observation for several months. The recent CT scan of the chest, abdomen and pelvis showed evidence for disease progression with further increase in multiple pulmonary nodules. I personally and independently reviewed the scan images and discuss the results with the patient and her friend. I gave her the option of  continuous observation and palliative care versus consideration of treatment with second line immunotherapy with Nivolumab 240 mg IV every 2 weeks. The patient is interested in proceeding with the treatment and she is expected to start  the first dose of this treatment in 2 weeks. I discussed with her the adverse effect of this treatment including but not limited to immune mediated skin rash, diarrhea, inflammation of the lung, kidney, liver, thyroid or other endocrine dysfunction. I also provided the patient with handouts about Nivolumab. For hypertension, she was advised to take her blood pressure medication as prescribed and to monitor her blood pressure closely at home. I will see her back for follow-up visit in 2 weeks with the start of the first cycle of her treatment. The patient was advised to call immediately if she has any concerning symptoms in the interval. The patient voices understanding of current disease status and treatment options and is in agreement with the current care plan. All questions were answered. The patient knows to call the clinic with any problems, questions or concerns. We can certainly see the patient much sooner if necessary.  Disclaimer: This note was dictated with voice recognition software. Similar sounding words can inadvertently be transcribed and may not be corrected upon review.

## 2016-12-12 ENCOUNTER — Telehealth: Payer: Self-pay

## 2016-12-12 NOTE — Telephone Encounter (Signed)
Pt had multiple questions about her new opdivo treatment. Questions were answered and pt expressed greater level of comfort emotionally about treatment.   1 how opdivo is classified 2 how it works 3 side effects 4 daughter and granddaughters coming with her 5 bringing copy of her living will 6 length of treatment  She mentioned that Dr Julien Nordmann stated he will see her before her treatment. Noted that first treatment on 5/22 does not have MD visit attached but the following treatments do. Please clarify that this is correct.

## 2016-12-23 ENCOUNTER — Other Ambulatory Visit (HOSPITAL_BASED_OUTPATIENT_CLINIC_OR_DEPARTMENT_OTHER): Payer: Medicare Other

## 2016-12-23 ENCOUNTER — Ambulatory Visit (HOSPITAL_BASED_OUTPATIENT_CLINIC_OR_DEPARTMENT_OTHER): Payer: Medicare Other

## 2016-12-23 VITALS — BP 152/61 | HR 71 | Temp 98.2°F | Resp 16

## 2016-12-23 DIAGNOSIS — C773 Secondary and unspecified malignant neoplasm of axilla and upper limb lymph nodes: Secondary | ICD-10-CM

## 2016-12-23 DIAGNOSIS — C3491 Malignant neoplasm of unspecified part of right bronchus or lung: Secondary | ICD-10-CM

## 2016-12-23 DIAGNOSIS — Z5112 Encounter for antineoplastic immunotherapy: Secondary | ICD-10-CM

## 2016-12-23 DIAGNOSIS — C3411 Malignant neoplasm of upper lobe, right bronchus or lung: Secondary | ICD-10-CM | POA: Diagnosis present

## 2016-12-23 DIAGNOSIS — R5382 Chronic fatigue, unspecified: Secondary | ICD-10-CM

## 2016-12-23 LAB — COMPREHENSIVE METABOLIC PANEL
ALK PHOS: 124 U/L (ref 40–150)
ALT: 32 U/L (ref 0–55)
AST: 37 U/L — AB (ref 5–34)
Albumin: 3.8 g/dL (ref 3.5–5.0)
Anion Gap: 7 mEq/L (ref 3–11)
BILIRUBIN TOTAL: 1.21 mg/dL — AB (ref 0.20–1.20)
BUN: 11.5 mg/dL (ref 7.0–26.0)
CO2: 27 meq/L (ref 22–29)
Calcium: 9.6 mg/dL (ref 8.4–10.4)
Chloride: 106 mEq/L (ref 98–109)
Creatinine: 0.7 mg/dL (ref 0.6–1.1)
EGFR: 84 mL/min/{1.73_m2} — AB (ref >90)
GLUCOSE: 115 mg/dL (ref 70–140)
POTASSIUM: 4.2 meq/L (ref 3.5–5.1)
SODIUM: 141 meq/L (ref 136–145)
TOTAL PROTEIN: 6.6 g/dL (ref 6.4–8.3)

## 2016-12-23 LAB — CBC WITH DIFFERENTIAL/PLATELET
BASO%: 0.6 % (ref 0.0–2.0)
Basophils Absolute: 0 10*3/uL (ref 0.0–0.1)
EOS%: 2.5 % (ref 0.0–7.0)
Eosinophils Absolute: 0.1 10*3/uL (ref 0.0–0.5)
HCT: 37.9 % (ref 34.8–46.6)
HGB: 13.2 g/dL (ref 11.6–15.9)
LYMPH%: 26.2 % (ref 14.0–49.7)
MCH: 34.2 pg — ABNORMAL HIGH (ref 25.1–34.0)
MCHC: 34.8 g/dL (ref 31.5–36.0)
MCV: 98.2 fL (ref 79.5–101.0)
MONO#: 0.4 10*3/uL (ref 0.1–0.9)
MONO%: 9.4 % (ref 0.0–14.0)
NEUT#: 2.6 10*3/uL (ref 1.5–6.5)
NEUT%: 61.3 % (ref 38.4–76.8)
Platelets: 139 10*3/uL — ABNORMAL LOW (ref 145–400)
RBC: 3.85 10*6/uL (ref 3.70–5.45)
RDW: 13.7 % (ref 11.2–14.5)
WBC: 4.2 10*3/uL (ref 3.9–10.3)
lymph#: 1.1 10*3/uL (ref 0.9–3.3)

## 2016-12-23 LAB — TSH: TSH: 2.169 m[IU]/L (ref 0.308–3.960)

## 2016-12-23 MED ORDER — SODIUM CHLORIDE 0.9 % IV SOLN
Freq: Once | INTRAVENOUS | Status: AC
  Administered 2016-12-23: 14:00:00 via INTRAVENOUS

## 2016-12-23 MED ORDER — SODIUM CHLORIDE 0.9 % IV SOLN
240.0000 mg | Freq: Once | INTRAVENOUS | Status: AC
  Administered 2016-12-23: 240 mg via INTRAVENOUS
  Filled 2016-12-23: qty 24

## 2016-12-23 NOTE — Patient Instructions (Signed)
[facility] Discharge Instructions for Patients Receiving Chemotherapy  Today you received the following chemotherapy agents Opdivo   To help prevent nausea and vomiting after your treatment, we encourage you to take your nausea medication as directed.    If you develop nausea and vomiting that is not controlled by your nausea medication, call the clinic.   BELOW ARE SYMPTOMS THAT SHOULD BE REPORTED IMMEDIATELY:  *FEVER GREATER THAN 100.5 F  *CHILLS WITH OR WITHOUT FEVER  NAUSEA AND VOMITING THAT IS NOT CONTROLLED WITH YOUR NAUSEA MEDICATION  *UNUSUAL SHORTNESS OF BREATH  *UNUSUAL BRUISING OR BLEEDING  TENDERNESS IN MOUTH AND THROAT WITH OR WITHOUT PRESENCE OF ULCERS  *URINARY PROBLEMS  *BOWEL PROBLEMS  UNUSUAL RASH Items with * indicate a potential emergency and should be followed up as soon as possible.  Feel free to call the clinic you have any questions or concerns. The clinic phone number is [phone].  Please show the CHEMO ALERT CARD at check-in to the Emergency Department and triage nurse.    Nivolumab injection What is this medicine? NIVOLUMAB (nye VOL ue mab) is a monoclonal antibody. It is used to treat melanoma, lung cancer, kidney cancer, head and neck cancer, Hodgkin lymphoma, urothelial cancer, colon cancer, and liver cancer. This medicine may be used for other purposes; ask your health care provider or pharmacist if you have questions. COMMON BRAND NAME(S): Opdivo What should I tell my health care provider before I take this medicine? They need to know if you have any of these conditions: -diabetes -immune system problems -kidney disease -liver disease -lung disease -organ transplant -stomach or intestine problems -thyroid disease -an unusual or allergic reaction to nivolumab, other medicines, foods, dyes, or preservatives -pregnant or trying to get pregnant -breast-feeding How should I use this medicine? This medicine is for  infusion into a vein. It is given by a health care professional in a hospital or clinic setting. A special MedGuide will be given to you before each treatment. Be sure to read this information carefully each time. Talk to your pediatrician regarding the use of this medicine in children. While this drug may be prescribed for children as Nancy Hodges as 12 years for selected conditions, precautions do apply. Overdosage: If you think you have taken too much of this medicine contact a poison control center or emergency room at once. NOTE: This medicine is only for you. Do not share this medicine with others. What if I miss a dose? It is important not to miss your dose. Call your doctor or health care professional if you are unable to keep an appointment. What may interact with this medicine? Interactions have not been studied. Give your health care provider a list of all the medicines, herbs, non-prescription drugs, or dietary supplements you use. Also tell them if you smoke, drink alcohol, or use illegal drugs. Some items may interact with your medicine. This list may not describe all possible interactions. Give your health care provider a list of all the medicines, herbs, non-prescription drugs, or dietary supplements you use. Also tell them if you smoke, drink alcohol, or use illegal drugs. Some items may interact with your medicine. What should I watch for while using this medicine? This drug may make you feel generally unwell. Continue your course of treatment even though you feel ill unless your doctor tells you to stop. You may need blood work done while you are taking this medicine. Do not become pregnant while taking this medicine or for 5   months after stopping it. Women should inform their doctor if they wish to become pregnant or think they might be pregnant. There is a potential for serious side effects to an unborn child. Talk to your health care professional or pharmacist for more information. Do  not breast-feed an infant while taking this medicine. What side effects may I notice from receiving this medicine? Side effects that you should report to your doctor or health care professional as soon as possible: -allergic reactions like skin rash, itching or hives, swelling of the face, lips, or tongue -black, tarry stools -blood in the urine -bloody or watery diarrhea -changes in vision -change in sex drive -changes in emotions or moods -chest pain -confusion -cough -decreased appetite -diarrhea -facial flushing -feeling faint or lightheaded -fever, chills -hair loss -hallucination, loss of contact with reality -headache -irritable -joint pain -loss of memory -muscle pain -muscle weakness -seizures -shortness of breath -signs and symptoms of high blood sugar such as dizziness; dry mouth; dry skin; fruity breath; nausea; stomach pain; increased hunger or thirst; increased urination -signs and symptoms of kidney injury like trouble passing urine or change in the amount of urine -signs and symptoms of liver injury like dark yellow or brown urine; general ill feeling or flu-like symptoms; light-colored stools; loss of appetite; nausea; right upper belly pain; unusually weak or tired; yellowing of the eyes or skin -stiff neck -swelling of the ankles, feet, hands -weight gain Side effects that usually do not require medical attention (report to your doctor or health care professional if they continue or are bothersome): -bone pain -constipation -tiredness -vomiting This list may not describe all possible side effects. Call your doctor for medical advice about side effects. You may report side effects to FDA at 1-800-FDA-1088. Where should I keep my medicine? This drug is given in a hospital or clinic and will not be stored at home. NOTE: This sheet is a summary. It may not cover all possible information. If you have questions about this medicine, talk to your doctor,  pharmacist, or health care provider.  2018 Elsevier/Gold Standard (2016-04-28 17:49:34)  

## 2016-12-25 ENCOUNTER — Telehealth: Payer: Self-pay | Admitting: Medical Oncology

## 2016-12-25 NOTE — Telephone Encounter (Signed)
Pt doing well after Opdivo- no complaints or problems.

## 2017-01-06 ENCOUNTER — Telehealth: Payer: Self-pay | Admitting: Internal Medicine

## 2017-01-06 ENCOUNTER — Ambulatory Visit (HOSPITAL_BASED_OUTPATIENT_CLINIC_OR_DEPARTMENT_OTHER): Payer: Medicare Other | Admitting: Internal Medicine

## 2017-01-06 ENCOUNTER — Encounter: Payer: Self-pay | Admitting: Internal Medicine

## 2017-01-06 ENCOUNTER — Ambulatory Visit (HOSPITAL_BASED_OUTPATIENT_CLINIC_OR_DEPARTMENT_OTHER): Payer: Medicare Other

## 2017-01-06 ENCOUNTER — Other Ambulatory Visit (HOSPITAL_BASED_OUTPATIENT_CLINIC_OR_DEPARTMENT_OTHER): Payer: Medicare Other

## 2017-01-06 VITALS — BP 153/54 | HR 76 | Temp 98.0°F | Resp 18 | Ht 62.0 in | Wt 147.2 lb

## 2017-01-06 DIAGNOSIS — C3411 Malignant neoplasm of upper lobe, right bronchus or lung: Secondary | ICD-10-CM | POA: Diagnosis not present

## 2017-01-06 DIAGNOSIS — C3491 Malignant neoplasm of unspecified part of right bronchus or lung: Secondary | ICD-10-CM

## 2017-01-06 DIAGNOSIS — C773 Secondary and unspecified malignant neoplasm of axilla and upper limb lymph nodes: Secondary | ICD-10-CM

## 2017-01-06 DIAGNOSIS — Z5112 Encounter for antineoplastic immunotherapy: Secondary | ICD-10-CM | POA: Diagnosis present

## 2017-01-06 LAB — CBC WITH DIFFERENTIAL/PLATELET
BASO%: 0.2 % (ref 0.0–2.0)
Basophils Absolute: 0 10*3/uL (ref 0.0–0.1)
EOS%: 3.1 % (ref 0.0–7.0)
Eosinophils Absolute: 0.1 10*3/uL (ref 0.0–0.5)
HCT: 37.8 % (ref 34.8–46.6)
HGB: 13.3 g/dL (ref 11.6–15.9)
LYMPH%: 22.5 % (ref 14.0–49.7)
MCH: 34.3 pg — ABNORMAL HIGH (ref 25.1–34.0)
MCHC: 35.2 g/dL (ref 31.5–36.0)
MCV: 97.4 fL (ref 79.5–101.0)
MONO#: 0.3 10*3/uL (ref 0.1–0.9)
MONO%: 7.1 % (ref 0.0–14.0)
NEUT%: 67.1 % (ref 38.4–76.8)
NEUTROS ABS: 2.8 10*3/uL (ref 1.5–6.5)
NRBC: 0 % (ref 0–0)
PLATELETS: 134 10*3/uL — AB (ref 145–400)
RBC: 3.88 10*6/uL (ref 3.70–5.45)
RDW: 13.1 % (ref 11.2–14.5)
WBC: 4.2 10*3/uL (ref 3.9–10.3)
lymph#: 1 10*3/uL (ref 0.9–3.3)

## 2017-01-06 LAB — COMPREHENSIVE METABOLIC PANEL
ALT: 31 U/L (ref 0–55)
ANION GAP: 10 meq/L (ref 3–11)
AST: 39 U/L — AB (ref 5–34)
Albumin: 4 g/dL (ref 3.5–5.0)
Alkaline Phosphatase: 131 U/L (ref 40–150)
BUN: 11.3 mg/dL (ref 7.0–26.0)
CHLORIDE: 106 meq/L (ref 98–109)
CO2: 27 meq/L (ref 22–29)
CREATININE: 0.8 mg/dL (ref 0.6–1.1)
Calcium: 10 mg/dL (ref 8.4–10.4)
EGFR: 75 mL/min/{1.73_m2} — ABNORMAL LOW (ref >90)
GLUCOSE: 137 mg/dL (ref 70–140)
Potassium: 4.2 mEq/L (ref 3.5–5.1)
SODIUM: 142 meq/L (ref 136–145)
TOTAL PROTEIN: 7.2 g/dL (ref 6.4–8.3)
Total Bilirubin: 1.62 mg/dL — ABNORMAL HIGH (ref 0.20–1.20)

## 2017-01-06 MED ORDER — SODIUM CHLORIDE 0.9 % IV SOLN
Freq: Once | INTRAVENOUS | Status: AC
  Administered 2017-01-06: 12:00:00 via INTRAVENOUS

## 2017-01-06 MED ORDER — SODIUM CHLORIDE 0.9 % IV SOLN
240.0000 mg | Freq: Once | INTRAVENOUS | Status: AC
  Administered 2017-01-06: 240 mg via INTRAVENOUS
  Filled 2017-01-06: qty 24

## 2017-01-06 NOTE — Patient Instructions (Signed)
[facility] Discharge Instructions for Patients Receiving Chemotherapy  Today you received the following chemotherapy agents Opdivo   To help prevent nausea and vomiting after your treatment, we encourage you to take your nausea medication as directed.    If you develop nausea and vomiting that is not controlled by your nausea medication, call the clinic.   BELOW ARE SYMPTOMS THAT SHOULD BE REPORTED IMMEDIATELY:  *FEVER GREATER THAN 100.5 F  *CHILLS WITH OR WITHOUT FEVER  NAUSEA AND VOMITING THAT IS NOT CONTROLLED WITH YOUR NAUSEA MEDICATION  *UNUSUAL SHORTNESS OF BREATH  *UNUSUAL BRUISING OR BLEEDING  TENDERNESS IN MOUTH AND THROAT WITH OR WITHOUT PRESENCE OF ULCERS  *URINARY PROBLEMS  *BOWEL PROBLEMS  UNUSUAL RASH Items with * indicate a potential emergency and should be followed up as soon as possible.  Feel free to call the clinic you have any questions or concerns. The clinic phone number is [phone].  Please show the [place] at check-in to the Emergency Department and triage nurse.    Nivolumab injection What is this medicine? NIVOLUMAB (nye VOL ue mab) is a monoclonal antibody. It is used to treat melanoma, lung cancer, kidney cancer, head and neck cancer, Hodgkin lymphoma, urothelial cancer, colon cancer, and liver cancer. This medicine may be used for other purposes; ask your health care provider or pharmacist if you have questions. COMMON BRAND NAME(S): Opdivo What should I tell my health care provider before I take this medicine? They need to know if you have any of these conditions: -diabetes -immune system problems -kidney disease -liver disease -lung disease -organ transplant -stomach or intestine problems -thyroid disease -an unusual or allergic reaction to nivolumab, other medicines, foods, dyes, or preservatives -pregnant or trying to get pregnant -breast-feeding How should I use this medicine? This medicine is for  infusion into a vein. It is given by a health care professional in a hospital or clinic setting. A special MedGuide will be given to you before each treatment. Be sure to read this information carefully each time. Talk to your pediatrician regarding the use of this medicine in children. While this drug may be prescribed for children as young as 12 years for selected conditions, precautions do apply. Overdosage: If you think you have taken too much of this medicine contact a poison control center or emergency room at once. NOTE: This medicine is only for you. Do not share this medicine with others. What if I miss a dose? It is important not to miss your dose. Call your doctor or health care professional if you are unable to keep an appointment. What may interact with this medicine? Interactions have not been studied. Give your health care provider a list of all the medicines, herbs, non-prescription drugs, or dietary supplements you use. Also tell them if you smoke, drink alcohol, or use illegal drugs. Some items may interact with your medicine. This list may not describe all possible interactions. Give your health care provider a list of all the medicines, herbs, non-prescription drugs, or dietary supplements you use. Also tell them if you smoke, drink alcohol, or use illegal drugs. Some items may interact with your medicine. What should I watch for while using this medicine? This drug may make you feel generally unwell. Continue your course of treatment even though you feel ill unless your doctor tells you to stop. You may need blood work done while you are taking this medicine. Do not become pregnant while taking this medicine or for 5  months after stopping it. Women should inform their doctor if they wish to become pregnant or think they might be pregnant. There is a potential for serious side effects to an unborn child. Talk to your health care professional or pharmacist for more information. Do  not breast-feed an infant while taking this medicine. What side effects may I notice from receiving this medicine? Side effects that you should report to your doctor or health care professional as soon as possible: -allergic reactions like skin rash, itching or hives, swelling of the face, lips, or tongue -black, tarry stools -blood in the urine -bloody or watery diarrhea -changes in vision -change in sex drive -changes in emotions or moods -chest pain -confusion -cough -decreased appetite -diarrhea -facial flushing -feeling faint or lightheaded -fever, chills -hair loss -hallucination, loss of contact with reality -headache -irritable -joint pain -loss of memory -muscle pain -muscle weakness -seizures -shortness of breath -signs and symptoms of high blood sugar such as dizziness; dry mouth; dry skin; fruity breath; nausea; stomach pain; increased hunger or thirst; increased urination -signs and symptoms of kidney injury like trouble passing urine or change in the amount of urine -signs and symptoms of liver injury like dark yellow or brown urine; general ill feeling or flu-like symptoms; light-colored stools; loss of appetite; nausea; right upper belly pain; unusually weak or tired; yellowing of the eyes or skin -stiff neck -swelling of the ankles, feet, hands -weight gain Side effects that usually do not require medical attention (report to your doctor or health care professional if they continue or are bothersome): -bone pain -constipation -tiredness -vomiting This list may not describe all possible side effects. Call your doctor for medical advice about side effects. You may report side effects to FDA at 1-800-FDA-1088. Where should I keep my medicine? This drug is given in a hospital or clinic and will not be stored at home. NOTE: This sheet is a summary. It may not cover all possible information. If you have questions about this medicine, talk to your doctor,  pharmacist, or health care provider.  2018 Elsevier/Gold Standard (2016-04-28 17:49:34)

## 2017-01-06 NOTE — Telephone Encounter (Signed)
All appts already scheduled per 6/5 LOS and treatment plan  ( 3cycles) - no additional appts needed at the moment.

## 2017-01-06 NOTE — Progress Notes (Signed)
[place] [place] [phone]   [phone]) [phone]  OFFICE PROGRESS NOTE  Nancy Hodges [address] [place] [address]  DIAGNOSIS: Metastatic non-small cell lung cancer, adenocarcinoma. This was initially diagnosed as stage IB (T2a, N0, M0) In March of 2014, status post right upper lobectomy with lymph node dissection but the patient has evidence for disease recurrence in the right axilla status post resection.  PRIOR THERAPY:  1) Status post right axillary lymph node biopsy on 05/01/2014 under the care of Dr. Roxan Hockey. 2) Systemic chemotherapy with carboplatin for AUC of 5 and Alimta 500 mg/M2 every 3 weeks. First cycle 05/31/2014. Status post 6 cycles. Last dose was given 09/22/2014. 3) palliative radiotherapy to the subcarinal lymph node under the care of Dr. Sondra Come  CURRENT THERAPY:  Nivolumab 240 mg IV every 2 weeks. First dose 12/23/2016.  INTERVAL HISTORY: Nancy Hodges 71 y.o. female returns to the clinic today for follow-up visit accompanied by her daughter, Nancy Hodges and her granddaughter. The patient was started on treatment with immunotherapy with Nivolumab and tolerated the first dose of her treatment fairly well. She denied having any nausea or vomiting. She has no fever or chills. She denied having any chest pain, shortness of breath, cough or hemoptysis. She still very active. She has no weight loss or night sweats. She 0 today for evaluation before starting cycle #2.  MEDICAL HISTORY: Past Medical History:  Diagnosis Date  . Allergic rhinitis   . Anxiety   . Arthritis   . Asthma   . Atrial flutter ([place])   . Cholelithiases 09/26/2015  . Diverticulosis   . Encounter for antineoplastic immunotherapy 12/09/2016  . GERD (gastroesophageal reflux disease)   . H/O hiatal hernia   . History of kidney stones   . Internal hemorrhoid   . Lung cancer ([facility])    a. Stage IB non-small cell carcinoma, s/p R VATS, wedge resection, RU  lobectomy 10/2012.  [name] Other specified diabetes mellitus with hyperglycemia ([facility])    hx of chemo induced high blood sugar  . Paroxysmal atrial fibrillation ([facility])    a. anticoagulated with Xarelto  . Pneumonia 06/2016   [facility]  . Pulmonary nodule   . Tachycardia-bradycardia syndrome ([place])    a. 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    ALLERGIES:  is allergic to prednisone; septra [sulfamethoxazole-trimethoprim]; penicillins; and vioxx [rofecoxib].  MEDICATIONS:  Current Outpatient Prescriptions  Medication Sig Dispense Refill  . acetaminophen (TYLENOL) 500 MG tablet Take 500 mg by mouth every 6 (six) hours as needed for moderate pain or fever.     [name] albuterol (PROVENTIL HFA;VENTOLIN HFA) 108 (90 BASE) MCG/ACT inhaler Inhale 1-2 puffs into the lungs every 6 (six) hours as needed for wheezing or shortness of breath.    . diltiazem (CARDIZEM CD) 240 MG 24 hr capsule Take 1 capsule (240 mg total) by mouth daily. 30 capsule 11  . fexofenadine (ALLEGRA) 180 MG tablet Take 180 mg by mouth daily.    [name] ipratropium-albuterol (DUONEB) 0.5-2.5 (3) MG/3ML SOLN Take 3 mLs by nebulization every 6 (six) hours as needed (shortness of breath or wheezing).     . meclizine (ANTIVERT) 12.5 MG tablet Take 12.5 mg by mouth 3 (three) times daily as needed for dizziness.    . rivaroxaban (XARELTO) 20 MG TABS tablet Take 1 tablet (20 mg total) by mouth daily with supper. 30 tablet 11   No current facility-administered medications for this visit.  SURGICAL HISTORY:  Past Surgical History:  Procedure Laterality Date  . CATARACT EXTRACTION W/PHACO Right 08/14/2016   Procedure: CATARACT EXTRACTION PHACO AND INTRAOCULAR LENS PLACEMENT RIGHT EYE CDE 10.53;  Surgeon: Tonny Branch, MD;  Location: [facility];  Service: Ophthalmology;  Laterality: Right;  right  . CATARACT EXTRACTION W/PHACO Left 09/25/2016   Procedure: CATARACT EXTRACTION PHACO AND INTRAOCULAR LENS PLACEMENT (IOC);  Surgeon: Tonny Branch, MD;   Location: [facility];  Service: Ophthalmology;  Laterality: Left;  left CDE 8.81  . COLONOSCOPY     [facility]: cattered sigmoid diverticula, internal hemorrhoids, sessile polyp in ascending and mid transverse colon (tubular adenoma).   . colonoscopy with polypectomy  2015  . ESOPHAGOGASTRODUODENOSCOPY     [facility]: bile reflux in stomach, mild gastritis, hiatal hernia  . ESOPHAGOGASTRODUODENOSCOPY N/A 01/31/2016   Procedure: ESOPHAGOGASTRODUODENOSCOPY (EGD);  Surgeon: [name], MD;  Location: [facility];  Service: Endoscopy;  Laterality: N/A;  1200  . LOBECTOMY Right 10/27/2012   Procedure: LOBECTOMY;  Surgeon: [name], MD;  Location: [facility];  Service: Thoracic;  Laterality: Right;   RIGHT UPPER LOBECTOMY & Node Dissection  . LYMPH NODE BIOPSY Right 05/01/2014   Procedure: LYMPH NODE BIOPSY, Right Axillary;  Surgeon: [name], MD;  Location: [facility];  Service: Thoracic;  Laterality: Right;  . PARTIAL HYSTERECTOMY    . PERMANENT PACEMAKER INSERTION N/A 09/14/2012   Nanostim (SJM) leadless pacemaker (LEADLESS II STUDY PATIENT) implanted by Dr [name]  . VIDEO ASSISTED THORACOSCOPY (VATS)/WEDGE RESECTION Right 10/27/2012   Procedure: VIDEO ASSISTED THORACOSCOPY (VATS),RGHT UPPER LOBE LUNG WEDGE RESECTION;  Surgeon: [name], MD;  Location: [facility];  Service: Thoracic;  Laterality: Right;    REVIEW OF SYSTEMS:  A comprehensive review of systems was negative.   PHYSICAL EXAMINATION: General appearance: alert, cooperative and no distress Head: Normocephalic, without obvious abnormality, atraumatic Neck: no adenopathy, no JVD, supple, symmetrical, trachea midline and thyroid not enlarged, symmetric, no tenderness/mass/nodules Lymph nodes: Cervical, supraclavicular, and axillary nodes normal. Resp: clear to auscultation bilaterally Back: symmetric, no curvature. ROM normal. No CVA tenderness. Cardio: regular rate and rhythm, S1, S2 normal, no murmur, click, rub or  gallop GI: soft, non-tender; bowel sounds normal; no masses,  no organomegaly Extremities: extremities normal, atraumatic, no cyanosis or edema  ECOG PERFORMANCE STATUS: 0 - Asymptomatic  Blood pressure (!) 153/54, pulse 76, temperature 98 F (36.7 C), temperature source Oral, resp. rate 18, height 5\' 2"  (1.575 m), weight 147 lb 3.2 oz (66.8 kg), SpO2 98 %.  LABORATORY DATA: Lab Results  Component Value Date   WBC 4.2 01/06/2017   HGB 13.3 01/06/2017   HCT 37.8 01/06/2017   MCV 97.4 01/06/2017   PLT 134 (L) 01/06/2017      Chemistry      Component Value Date/Time   NA 141 12/23/2016 1314   K 4.2 12/23/2016 1314   CL 107 08/12/2016 0937   CO2 27 12/23/2016 1314   BUN 11.5 12/23/2016 1314   CREATININE 0.7 12/23/2016 1314      Component Value Date/Time   CALCIUM 9.6 12/23/2016 1314   ALKPHOS 124 12/23/2016 1314   AST 37 (H) 12/23/2016 1314   ALT 32 12/23/2016 1314   BILITOT 1.21 (H) 12/23/2016 1314       RADIOGRAPHIC STUDIES: No results found. ASSESSMENT AND PLAN:  This is a very pleasant 71 years old white female with metastatic non-small cell lung cancer, adenocarcinoma with no actionable mutations. The patient is currently on treatment  with immunotherapy with Nivolumab status post one cycle. She tolerated the first cycle of her treatment well with no significant adverse effects. I recommended for her to proceed with cycle #2 today as a scheduled. I will see her back for follow-up visit in 2 weeks for evaluation before starting cycle #3. The patient was advised to call immediately if she has any concerning symptoms in the interval. The patient voices understanding of current disease status and treatment options and is in agreement with the current care plan. All questions were answered. The patient knows to call the clinic with any problems, questions or concerns. We can certainly see the patient much sooner if necessary. I spent 10 minutes counseling the patient face  to face. The total time spent in the appointment was 15 minutes.  Disclaimer: This note was dictated with voice recognition software. Similar sounding words can inadvertently be transcribed and may not be corrected upon review.

## 2017-01-06 NOTE — Progress Notes (Signed)
Per Dr Julien Nordmann, ok to tx with bilirubin of 1.62 today.

## 2017-01-20 ENCOUNTER — Encounter: Payer: Self-pay | Admitting: *Deleted

## 2017-01-20 ENCOUNTER — Encounter: Payer: Self-pay | Admitting: Internal Medicine

## 2017-01-20 ENCOUNTER — Telehealth: Payer: Self-pay | Admitting: Internal Medicine

## 2017-01-20 ENCOUNTER — Other Ambulatory Visit (HOSPITAL_BASED_OUTPATIENT_CLINIC_OR_DEPARTMENT_OTHER): Payer: Medicare Other

## 2017-01-20 ENCOUNTER — Ambulatory Visit (HOSPITAL_BASED_OUTPATIENT_CLINIC_OR_DEPARTMENT_OTHER): Payer: Medicare Other

## 2017-01-20 ENCOUNTER — Ambulatory Visit (HOSPITAL_BASED_OUTPATIENT_CLINIC_OR_DEPARTMENT_OTHER): Payer: Medicare Other | Admitting: Internal Medicine

## 2017-01-20 VITALS — BP 144/43 | HR 71 | Temp 98.1°F | Resp 17 | Ht 62.0 in | Wt 149.0 lb

## 2017-01-20 DIAGNOSIS — Z79899 Other long term (current) drug therapy: Secondary | ICD-10-CM

## 2017-01-20 DIAGNOSIS — C3411 Malignant neoplasm of upper lobe, right bronchus or lung: Secondary | ICD-10-CM

## 2017-01-20 DIAGNOSIS — C3491 Malignant neoplasm of unspecified part of right bronchus or lung: Secondary | ICD-10-CM

## 2017-01-20 DIAGNOSIS — Z5112 Encounter for antineoplastic immunotherapy: Secondary | ICD-10-CM

## 2017-01-20 DIAGNOSIS — C773 Secondary and unspecified malignant neoplasm of axilla and upper limb lymph nodes: Secondary | ICD-10-CM | POA: Diagnosis present

## 2017-01-20 DIAGNOSIS — R5382 Chronic fatigue, unspecified: Secondary | ICD-10-CM

## 2017-01-20 LAB — COMPREHENSIVE METABOLIC PANEL
ALT: 30 U/L (ref 0–55)
ANION GAP: 8 meq/L (ref 3–11)
AST: 38 U/L — ABNORMAL HIGH (ref 5–34)
Albumin: 3.7 g/dL (ref 3.5–5.0)
Alkaline Phosphatase: 115 U/L (ref 40–150)
BUN: 11 mg/dL (ref 7.0–26.0)
CO2: 25 meq/L (ref 22–29)
CREATININE: 0.8 mg/dL (ref 0.6–1.1)
Calcium: 10 mg/dL (ref 8.4–10.4)
Chloride: 108 mEq/L (ref 98–109)
EGFR: 79 mL/min/{1.73_m2} — ABNORMAL LOW (ref >90)
GLUCOSE: 105 mg/dL (ref 70–140)
Potassium: 4.4 mEq/L (ref 3.5–5.1)
Sodium: 141 mEq/L (ref 136–145)
Total Bilirubin: 1.57 mg/dL — ABNORMAL HIGH (ref 0.20–1.20)
Total Protein: 6.9 g/dL (ref 6.4–8.3)

## 2017-01-20 LAB — TSH: TSH: 2.117 m[IU]/L (ref 0.308–3.960)

## 2017-01-20 LAB — CBC WITH DIFFERENTIAL/PLATELET
BASO%: 1.2 % (ref 0.0–2.0)
Basophils Absolute: 0.1 10*3/uL (ref 0.0–0.1)
EOS%: 3 % (ref 0.0–7.0)
Eosinophils Absolute: 0.1 10*3/uL (ref 0.0–0.5)
HEMATOCRIT: 38.1 % (ref 34.8–46.6)
HGB: 13.3 g/dL (ref 11.6–15.9)
LYMPH#: 1.1 10*3/uL (ref 0.9–3.3)
LYMPH%: 25 % (ref 14.0–49.7)
MCH: 34.1 pg — ABNORMAL HIGH (ref 25.1–34.0)
MCHC: 34.9 g/dL (ref 31.5–36.0)
MCV: 97.9 fL (ref 79.5–101.0)
MONO#: 0.4 10*3/uL (ref 0.1–0.9)
MONO%: 8.7 % (ref 0.0–14.0)
NEUT#: 2.7 10*3/uL (ref 1.5–6.5)
NEUT%: 62.1 % (ref 38.4–76.8)
PLATELETS: 160 10*3/uL (ref 145–400)
RBC: 3.89 10*6/uL (ref 3.70–5.45)
RDW: 13 % (ref 11.2–14.5)
WBC: 4.4 10*3/uL (ref 3.9–10.3)

## 2017-01-20 MED ORDER — SODIUM CHLORIDE 0.9 % IV SOLN
Freq: Once | INTRAVENOUS | Status: AC
  Administered 2017-01-20: 13:00:00 via INTRAVENOUS

## 2017-01-20 MED ORDER — SODIUM CHLORIDE 0.9 % IV SOLN
240.0000 mg | Freq: Once | INTRAVENOUS | Status: AC
  Administered 2017-01-20: 240 mg via INTRAVENOUS
  Filled 2017-01-20: qty 24

## 2017-01-20 NOTE — Patient Instructions (Signed)
[facility] Discharge Instructions for Patients Receiving Chemotherapy  Today you received the following chemotherapy agents:  Nivolumab.  To help prevent nausea and vomiting after your treatment, we encourage you to take your nausea medication as directed.   If you develop nausea and vomiting that is not controlled by your nausea medication, call the clinic.   BELOW ARE SYMPTOMS THAT SHOULD BE REPORTED IMMEDIATELY:  *FEVER GREATER THAN 100.5 F  *CHILLS WITH OR WITHOUT FEVER  NAUSEA AND VOMITING THAT IS NOT CONTROLLED WITH YOUR NAUSEA MEDICATION  *UNUSUAL SHORTNESS OF BREATH  *UNUSUAL BRUISING OR BLEEDING  TENDERNESS IN MOUTH AND THROAT WITH OR WITHOUT PRESENCE OF ULCERS  *URINARY PROBLEMS  *BOWEL PROBLEMS  UNUSUAL RASH Items with * indicate a potential emergency and should be followed up as soon as possible.  Feel free to call the clinic you have any questions or concerns. The clinic phone number is [phone].  Please show the CHEMO ALERT CARD at check-in to the Emergency Department and triage nurse.   

## 2017-01-20 NOTE — Telephone Encounter (Signed)
Appts already scheduled per 6/19 los - already 3 cycles. Gave patient AVS and calender.

## 2017-01-20 NOTE — Progress Notes (Signed)
Oncology Nurse Navigator Documentation  Oncology Nurse Navigator Flowsheets 01/20/2017  Navigator Location [facility]  Navigator Encounter Type Treatment/I spoke with patient and explained side effects of IO therapy.  I also gave her a print out.  Patient Visit Type MedOnc  Treatment Phase Treatment  Barriers/Navigation Needs Education  Education Other  Interventions Education  Acuity Level 1  Time Spent with Patient 15

## 2017-01-20 NOTE — Progress Notes (Signed)
[facility] Telephone:(336) [phone]   Fax:(336) [phone]  OFFICE PROGRESS NOTE  [name] [address] [place] [address]  DIAGNOSIS: Metastatic non-small cell lung cancer, adenocarcinoma. This was initially diagnosed as stage IB (T2a, N0, M0) in March of 2014, status post right upper lobectomy with lymph node dissection but the patient has evidence for disease recurrence in the right axilla status post resection.  PRIOR THERAPY:  1) Status post right axillary lymph node biopsy on 05/01/2014 under the care of Dr. Roxan Hockey. 2) Systemic chemotherapy with carboplatin for AUC of 5 and Alimta 500 mg/M2 every 3 weeks. First cycle 05/31/2014. Status post 6 cycles. Last dose was given 09/22/2014. 3) palliative radiotherapy to the subcarinal lymph node under the care of Dr. Sondra Come  CURRENT THERAPY:  Nivolumab 240 mg IV every 2 weeks. First dose 12/23/2016. Status post 2 cycles.  INTERVAL HISTORY: Nancy Hodges 71 y.o. female returns to the clinic today for follow-up visit. The patient is feeling fine today with no specific complaints. She is tolerating her current treatment with Nivolumab fairly well. She denied having any chest pain, shortness of breath, cough or hemoptysis. She has no fever or chills. She denied having any nausea, vomiting, diarrhea or constipation. She is here today for evaluation before starting cycle #3.  MEDICAL HISTORY: Past Medical History:  Diagnosis Date  . Allergic rhinitis   . Anxiety   . Arthritis   . Asthma   . Atrial flutter ([place])   . Cholelithiases 09/26/2015  . Diverticulosis   . Encounter for antineoplastic immunotherapy 12/09/2016  . GERD (gastroesophageal reflux disease)   . H/O hiatal hernia   . History of kidney stones   . Internal hemorrhoid   . Lung cancer ([place])    a. Stage IB non-small cell carcinoma, s/p R VATS, wedge resection, RU lobectomy 10/2012.  [name] Other specified diabetes mellitus with  hyperglycemia ([place])    hx of chemo induced high blood sugar  . Paroxysmal atrial fibrillation ([facility])    a. anticoagulated with Xarelto  . Pneumonia 06/2016   [facility]  . Pulmonary nodule   . Tachycardia-bradycardia syndrome ([place])    a. 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    ALLERGIES:  is allergic to prednisone; septra [sulfamethoxazole-trimethoprim]; penicillins; and vioxx [rofecoxib].  MEDICATIONS:  Current Outpatient Prescriptions  Medication Sig Dispense Refill  . acetaminophen (TYLENOL) 500 MG tablet Take 500 mg by mouth every 6 (six) hours as needed for moderate pain or fever.     [name] albuterol (PROVENTIL HFA;VENTOLIN HFA) 108 (90 BASE) MCG/ACT inhaler Inhale 1-2 puffs into the lungs every 6 (six) hours as needed for wheezing or shortness of breath.    . diltiazem (CARDIZEM CD) 240 MG 24 hr capsule Take 1 capsule (240 mg total) by mouth daily. 30 capsule 11  . fexofenadine (ALLEGRA) 180 MG tablet Take 180 mg by mouth daily.    [name] ipratropium-albuterol (DUONEB) 0.5-2.5 (3) MG/3ML SOLN Take 3 mLs by nebulization every 6 (six) hours as needed (shortness of breath or wheezing).     . meclizine (ANTIVERT) 12.5 MG tablet Take 12.5 mg by mouth 3 (three) times daily as needed for dizziness.    . rivaroxaban (XARELTO) 20 MG TABS tablet Take 1 tablet (20 mg total) by mouth daily with supper. 30 tablet 11   No current facility-administered medications for this visit.     SURGICAL HISTORY:  Past Surgical History:  Procedure Laterality Date  .  CATARACT EXTRACTION W/PHACO Right 08/14/2016   Procedure: CATARACT EXTRACTION PHACO AND INTRAOCULAR LENS PLACEMENT RIGHT EYE CDE 10.53;  Surgeon: Tonny Branch, MD;  Location: [facility];  Service: Ophthalmology;  Laterality: Right;  right  . CATARACT EXTRACTION W/PHACO Left 09/25/2016   Procedure: CATARACT EXTRACTION PHACO AND INTRAOCULAR LENS PLACEMENT (IOC);  Surgeon: Tonny Branch, MD;  Location: [facility];  Service: Ophthalmology;  Laterality: Left;   left CDE 8.81  . COLONOSCOPY     [facility]: cattered sigmoid diverticula, internal hemorrhoids, sessile polyp in ascending and mid transverse colon (tubular adenoma).   . colonoscopy with polypectomy  2015  . ESOPHAGOGASTRODUODENOSCOPY     [facility]: bile reflux in stomach, mild gastritis, hiatal hernia  . ESOPHAGOGASTRODUODENOSCOPY N/A 01/31/2016   Procedure: ESOPHAGOGASTRODUODENOSCOPY (EGD);  Surgeon: [name], MD;  [facility];  Service: Endoscopy;  Laterality: N/A;  1200  . LOBECTOMY Right 10/27/2012   Procedure: LOBECTOMY;  Surgeon: [name], MD;  Location: [facility];  Service: Thoracic;  Laterality: Right;   RIGHT UPPER LOBECTOMY & Node Dissection  . LYMPH NODE BIOPSY Right 05/01/2014   Procedure: LYMPH NODE BIOPSY, Right Axillary;  Surgeon: [name], MD;  Location: [facility];  Service: Thoracic;  Laterality: Right;  . PARTIAL HYSTERECTOMY    . PERMANENT PACEMAKER INSERTION N/A 09/14/2012   Nanostim (SJM) leadless pacemaker (LEADLESS II STUDY PATIENT) implanted by Dr [name]  . VIDEO ASSISTED THORACOSCOPY (VATS)/WEDGE RESECTION Right 10/27/2012   Procedure: VIDEO ASSISTED THORACOSCOPY (VATS),RGHT UPPER LOBE LUNG WEDGE RESECTION;  Surgeon: [name], MD;  Location: [facility];  Service: Thoracic;  Laterality: Right;    REVIEW OF SYSTEMS:  A comprehensive review of systems was negative.   PHYSICAL EXAMINATION: General appearance: alert, cooperative and no distress Head: Normocephalic, without obvious abnormality, atraumatic Neck: no adenopathy, no JVD, supple, symmetrical, trachea midline and thyroid not enlarged, symmetric, no tenderness/mass/nodules Lymph nodes: Cervical, supraclavicular, and axillary nodes normal. Resp: clear to auscultation bilaterally Back: symmetric, no curvature. ROM normal. No CVA tenderness. Cardio: regular rate and rhythm, S1, S2 normal, no murmur, click, rub or gallop GI: soft, non-tender; bowel sounds normal; no masses,  no  organomegaly Extremities: extremities normal, atraumatic, no cyanosis or edema  ECOG PERFORMANCE STATUS: 0 - Asymptomatic  Blood pressure (!) 144/43, pulse 71, temperature 98.1 F (36.7 C), temperature source Oral, resp. rate 17, height 5\' 2"  (1.575 m), weight 149 lb (67.6 kg), SpO2 99 %.  LABORATORY DATA: Lab Results  Component Value Date   WBC 4.4 01/20/2017   HGB 13.3 01/20/2017   HCT 38.1 01/20/2017   MCV 97.9 01/20/2017   PLT 160 01/20/2017      Chemistry      Component Value Date/Time   NA 141 01/20/2017 0949   K 4.4 01/20/2017 0949   CL 107 08/12/2016 0937   CO2 25 01/20/2017 0949   BUN 11.0 01/20/2017 0949   CREATININE 0.8 01/20/2017 0949      Component Value Date/Time   CALCIUM 10.0 01/20/2017 0949   ALKPHOS 115 01/20/2017 0949   AST 38 (H) 01/20/2017 0949   ALT 30 01/20/2017 0949   BILITOT 1.57 (H) 01/20/2017 0949       RADIOGRAPHIC STUDIES: No results found. ASSESSMENT AND PLAN:  This is a very pleasant 71 years old white female with metastatic non-small cell lung cancer, adenocarcinoma with no actionable mutations status post previous treatment was carboplatin and Alimta and she is currently undergoing second line treatment with immunotherapy with Nivolumab status  post 2 cycles. She is tolerating her treatments well with no significant adverse effects. I recommended for the patient to proceed with cycle #3 today as scheduled. I will see her back for follow-up visit in 2 weeks for evaluation before starting cycle #4. She was advised to call immediately if she has any concerning symptoms in the interval. The patient voices understanding of current disease status and treatment options and is in agreement with the current care plan. All questions were answered. The patient knows to call the clinic with any problems, questions or concerns. We can certainly see the patient much sooner if necessary. I spent 10 minutes counseling the patient face to face. The total  time spent in the appointment was 15 minutes.  Disclaimer: This note was dictated with voice recognition software. Similar sounding words can inadvertently be transcribed and may not be corrected upon review.

## 2017-01-30 ENCOUNTER — Telehealth: Payer: Self-pay | Admitting: Internal Medicine

## 2017-01-30 NOTE — Telephone Encounter (Signed)
Left a message on VM about appointment changes

## 2017-02-03 ENCOUNTER — Other Ambulatory Visit (HOSPITAL_BASED_OUTPATIENT_CLINIC_OR_DEPARTMENT_OTHER): Payer: Medicare Other

## 2017-02-03 ENCOUNTER — Telehealth: Payer: Self-pay | Admitting: Internal Medicine

## 2017-02-03 ENCOUNTER — Encounter: Payer: Self-pay | Admitting: Internal Medicine

## 2017-02-03 ENCOUNTER — Ambulatory Visit (HOSPITAL_BASED_OUTPATIENT_CLINIC_OR_DEPARTMENT_OTHER): Payer: Medicare Other

## 2017-02-03 ENCOUNTER — Ambulatory Visit (HOSPITAL_BASED_OUTPATIENT_CLINIC_OR_DEPARTMENT_OTHER): Payer: Medicare Other | Admitting: Internal Medicine

## 2017-02-03 VITALS — BP 132/57 | HR 69 | Temp 98.0°F | Resp 18 | Ht 62.0 in | Wt 147.2 lb

## 2017-02-03 DIAGNOSIS — C773 Secondary and unspecified malignant neoplasm of axilla and upper limb lymph nodes: Secondary | ICD-10-CM | POA: Diagnosis not present

## 2017-02-03 DIAGNOSIS — C3491 Malignant neoplasm of unspecified part of right bronchus or lung: Secondary | ICD-10-CM

## 2017-02-03 DIAGNOSIS — C3411 Malignant neoplasm of upper lobe, right bronchus or lung: Secondary | ICD-10-CM

## 2017-02-03 DIAGNOSIS — Z5112 Encounter for antineoplastic immunotherapy: Secondary | ICD-10-CM

## 2017-02-03 DIAGNOSIS — R5382 Chronic fatigue, unspecified: Secondary | ICD-10-CM

## 2017-02-03 LAB — CBC WITH DIFFERENTIAL/PLATELET
BASO%: 0.7 % (ref 0.0–2.0)
BASOS ABS: 0 10*3/uL (ref 0.0–0.1)
EOS ABS: 0.1 10*3/uL (ref 0.0–0.5)
EOS%: 2.7 % (ref 0.0–7.0)
HCT: 39.1 % (ref 34.8–46.6)
HGB: 13.8 g/dL (ref 11.6–15.9)
LYMPH%: 26.9 % (ref 14.0–49.7)
MCH: 34.3 pg — ABNORMAL HIGH (ref 25.1–34.0)
MCHC: 35.4 g/dL (ref 31.5–36.0)
MCV: 97 fL (ref 79.5–101.0)
MONO#: 0.4 10*3/uL (ref 0.1–0.9)
MONO%: 9.7 % (ref 0.0–14.0)
NEUT%: 60 % (ref 38.4–76.8)
NEUTROS ABS: 2.8 10*3/uL (ref 1.5–6.5)
PLATELETS: 164 10*3/uL (ref 145–400)
RBC: 4.03 10*6/uL (ref 3.70–5.45)
RDW: 13.3 % (ref 11.2–14.5)
WBC: 4.6 10*3/uL (ref 3.9–10.3)
lymph#: 1.2 10*3/uL (ref 0.9–3.3)

## 2017-02-03 LAB — COMPREHENSIVE METABOLIC PANEL
ALK PHOS: 120 U/L (ref 40–150)
ALT: 28 U/L (ref 0–55)
ANION GAP: 9 meq/L (ref 3–11)
AST: 38 U/L — ABNORMAL HIGH (ref 5–34)
Albumin: 3.9 g/dL (ref 3.5–5.0)
BILIRUBIN TOTAL: 1.59 mg/dL — AB (ref 0.20–1.20)
BUN: 9.3 mg/dL (ref 7.0–26.0)
CO2: 27 mEq/L (ref 22–29)
Calcium: 10.1 mg/dL (ref 8.4–10.4)
Chloride: 106 mEq/L (ref 98–109)
Creatinine: 0.8 mg/dL (ref 0.6–1.1)
EGFR: 79 mL/min/{1.73_m2} — AB (ref >90)
Glucose: 104 mg/dl (ref 70–140)
Potassium: 4.2 mEq/L (ref 3.5–5.1)
Sodium: 141 mEq/L (ref 136–145)
TOTAL PROTEIN: 7.2 g/dL (ref 6.4–8.3)

## 2017-02-03 LAB — TSH: TSH: 1.971 m(IU)/L (ref 0.308–3.960)

## 2017-02-03 MED ORDER — SODIUM CHLORIDE 0.9 % IV SOLN
Freq: Once | INTRAVENOUS | Status: AC
  Administered 2017-02-03: 12:00:00 via INTRAVENOUS

## 2017-02-03 MED ORDER — NIVOLUMAB CHEMO INJECTION 100 MG/10ML
240.0000 mg | Freq: Once | INTRAVENOUS | Status: AC
  Administered 2017-02-03: 240 mg via INTRAVENOUS
  Filled 2017-02-03: qty 24

## 2017-02-03 NOTE — Progress Notes (Signed)
[facility] Telephone:(336) [phone]   Fax:(336) [phone]  OFFICE PROGRESS NOTE  [name] [address] [place] [address]  DIAGNOSIS: Metastatic non-small cell lung cancer, adenocarcinoma. This was initially diagnosed as stage IB (T2a, N0, M0) in March of 2014, status post right upper lobectomy with lymph node dissection but the patient has evidence for disease recurrence in the right axilla status post resection.  PRIOR THERAPY:  1) Status post right axillary lymph node biopsy on 05/01/2014 under the care of Dr. Roxan Hockey. 2) Systemic chemotherapy with carboplatin for AUC of 5 and Alimta 500 mg/M2 every 3 weeks. First cycle 05/31/2014. Status post 6 cycles. Last dose was given 09/22/2014. 3) palliative radiotherapy to the subcarinal lymph node under the care of Dr. Sondra Come  CURRENT THERAPY:  Nivolumab 240 mg IV every 2 weeks. First dose 12/23/2016. Status post 3 cycles.  INTERVAL HISTORY: Nancy Hodges 71 y.o. female returns to the clinic today for follow-up visit accompanied by her daughter. The patient is currently on treatment with immunotherapy with Nivolumab and is still tolerating it fairly well. She denied having any chest pain, shortness of breath, cough or hemoptysis. She denied having any weight loss or night sweats. She has no nausea, vomiting, diarrhea or constipation. The patient denied having any fever or chills. She is here today for evaluation before starting cycle #4 of her treatment.   MEDICAL HISTORY: Past Medical History:  Diagnosis Date  . Allergic rhinitis   . Anxiety   . Arthritis   . Asthma   . Atrial flutter ([place])   . Cholelithiases 09/26/2015  . Diverticulosis   . Encounter for antineoplastic immunotherapy 12/09/2016  . GERD (gastroesophageal reflux disease)   . H/O hiatal hernia   . History of kidney stones   . Internal hemorrhoid   . Lung cancer ([place])    a. Stage IB non-small cell carcinoma, s/p R VATS,  wedge resection, RU lobectomy 10/2012.  [name] Other specified diabetes mellitus with hyperglycemia ([place])    hx of chemo induced high blood sugar  . Paroxysmal atrial fibrillation ([facility])    a. anticoagulated with Xarelto  . Pneumonia 06/2016   [facility]  . Pulmonary nodule   . Tachycardia-bradycardia syndrome ([place])    a. 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    ALLERGIES:  is allergic to prednisone; septra [sulfamethoxazole-trimethoprim]; penicillins; and vioxx [rofecoxib].  MEDICATIONS:  Current Outpatient Prescriptions  Medication Sig Dispense Refill  . acetaminophen (TYLENOL) 500 MG tablet Take 500 mg by mouth every 6 (six) hours as needed for moderate pain or fever.     [name] albuterol (PROVENTIL HFA;VENTOLIN HFA) 108 (90 BASE) MCG/ACT inhaler Inhale 1-2 puffs into the lungs every 6 (six) hours as needed for wheezing or shortness of breath.    . diltiazem (CARDIZEM CD) 240 MG 24 hr capsule Take 1 capsule (240 mg total) by mouth daily. 30 capsule 11  . fexofenadine (ALLEGRA) 180 MG tablet Take 180 mg by mouth daily.    [name] ipratropium-albuterol (DUONEB) 0.5-2.5 (3) MG/3ML SOLN Take 3 mLs by nebulization every 6 (six) hours as needed (shortness of breath or wheezing).     . meclizine (ANTIVERT) 12.5 MG tablet Take 12.5 mg by mouth 3 (three) times daily as needed for dizziness.    . Multiple Vitamin (MULTIVITAMIN) tablet Take 1 tablet by mouth daily.    . multivitamin-iron-minerals-folic acid (CENTRUM) chewable tablet Chew 1 tablet by mouth daily.    [name]  rivaroxaban (XARELTO) 20 MG TABS tablet Take 1 tablet (20 mg total) by mouth daily with supper. 30 tablet 11   No current facility-administered medications for this visit.     SURGICAL HISTORY:  Past Surgical History:  Procedure Laterality Date  . CATARACT EXTRACTION W/PHACO Right 08/14/2016   Procedure: CATARACT EXTRACTION PHACO AND INTRAOCULAR LENS PLACEMENT RIGHT EYE CDE 10.53;  Surgeon: Tonny Branch, MD;  Location[facility];  Service:  Ophthalmology;  Laterality: Right;  right  . CATARACT EXTRACTION W/PHACO Left 09/25/2016   Procedure: CATARACT EXTRACTION PHACO AND INTRAOCULAR LENS PLACEMENT (IOC);  Surgeon: Tonny Branch, MD;  [facility];  Service: Ophthalmology;  Laterality: Left;  left CDE 8.81  . COLONOSCOPY     [facility]: cattered sigmoid diverticula, internal hemorrhoids, sessile polyp in ascending and mid transverse colon (tubular adenoma).   . colonoscopy with polypectomy  2015  . ESOPHAGOGASTRODUODENOSCOPY     [facility]: bile reflux in stomach, mild gastritis, hiatal hernia  . ESOPHAGOGASTRODUODENOSCOPY N/A 01/31/2016   Procedure: ESOPHAGOGASTRODUODENOSCOPY (EGD);  Surgeon: [name], MD;  [facility];  Service: Endoscopy;  Laterality: N/A;  1200  . LOBECTOMY Right 10/27/2012   Procedure: LOBECTOMY;  Surgeon: [name], MD;  Location: [facility];  Service: Thoracic;  Laterality: Right;   RIGHT UPPER LOBECTOMY & Node Dissection  . LYMPH NODE BIOPSY Right 05/01/2014   Procedure: LYMPH NODE BIOPSY, Right Axillary;  Surgeon: [name], MD;  Location: [facility];  Service: Thoracic;  Laterality: Right;  . PARTIAL HYSTERECTOMY    . PERMANENT PACEMAKER INSERTION N/A 09/14/2012   Nanostim (SJM) leadless pacemaker (LEADLESS II STUDY PATIENT) implanted by Dr [name]  . VIDEO ASSISTED THORACOSCOPY (VATS)/WEDGE RESECTION Right 10/27/2012   Procedure: VIDEO ASSISTED THORACOSCOPY (VATS),RGHT UPPER LOBE LUNG WEDGE RESECTION;  Surgeon: [name], MD;  Location: [facility];  Service: Thoracic;  Laterality: Right;    REVIEW OF SYSTEMS:  A comprehensive review of systems was negative.   PHYSICAL EXAMINATION: General appearance: alert, cooperative and no distress Head: Normocephalic, without obvious abnormality, atraumatic Neck: no adenopathy, no JVD, supple, symmetrical, trachea midline and thyroid not enlarged, symmetric, no tenderness/mass/nodules Lymph nodes: Cervical, supraclavicular, and axillary  nodes normal. Resp: clear to auscultation bilaterally Back: symmetric, no curvature. ROM normal. No CVA tenderness. Cardio: regular rate and rhythm, S1, S2 normal, no murmur, click, rub or gallop GI: soft, non-tender; bowel sounds normal; no masses,  no organomegaly Extremities: extremities normal, atraumatic, no cyanosis or edema  ECOG PERFORMANCE STATUS: 0 - Asymptomatic  Blood pressure (!) 132/57, pulse 69, temperature 98 F (36.7 C), temperature source Oral, resp. rate 18, height 5\' 2"  (1.575 m), weight 147 lb 3.2 oz (66.8 kg), SpO2 98 %.  LABORATORY DATA: Lab Results  Component Value Date   WBC 4.6 02/03/2017   HGB 13.8 02/03/2017   HCT 39.1 02/03/2017   MCV 97.0 02/03/2017   PLT 164 02/03/2017      Chemistry      Component Value Date/Time   NA 141 01/20/2017 0949   K 4.4 01/20/2017 0949   CL 107 08/12/2016 0937   CO2 25 01/20/2017 0949   BUN 11.0 01/20/2017 0949   CREATININE 0.8 01/20/2017 0949      Component Value Date/Time   CALCIUM 10.0 01/20/2017 0949   ALKPHOS 115 01/20/2017 0949   AST 38 (H) 01/20/2017 0949   ALT 30 01/20/2017 0949   BILITOT 1.57 (H) 01/20/2017 0949       RADIOGRAPHIC STUDIES: No results  found. ASSESSMENT AND PLAN:  This is a very pleasant 71 years old white female with metastatic non-small cell lung cancer, adenocarcinoma with no actionable mutations status post systemic chemotherapy with carboplatin and Alimta and she is currently undergoing treatment with second line immunotherapy with Nivolumab status post 3 cycles. The patient is tolerating the treatment well with no significant adverse effects. I recommended for her to proceed with cycle #4 today as a scheduled. I will see her back for follow-up visit in 2 weeks for evaluation after repeating CT scan of the chest, abdomen and pelvis for restaging of her disease. She was advised to call immediately if she has any concerning symptoms in the interval. The patient voices understanding  of current disease status and treatment options and is in agreement with the current care plan. All questions were answered. The patient knows to call the clinic with any problems, questions or concerns. We can certainly see the patient much sooner if necessary. I spent 10 minutes counseling the patient face to face. The total time spent in the appointment was 15 minutes.  Disclaimer: This note was dictated with voice recognition software. Similar sounding words can inadvertently be transcribed and may not be corrected upon review.

## 2017-02-03 NOTE — Progress Notes (Signed)
Okay to treat with total bilirubin of 1.59 per Dr. Earlie Server.

## 2017-02-03 NOTE — Telephone Encounter (Signed)
Scheduled appt per 7/3 los - Gave patient AVS and calender per los.  

## 2017-02-03 NOTE — Patient Instructions (Signed)
[facility] Discharge Instructions for Patients Receiving Chemotherapy  Today you received the following chemotherapy agents:  Nivolumab.  To help prevent nausea and vomiting after your treatment, we encourage you to take your nausea medication as directed.   If you develop nausea and vomiting that is not controlled by your nausea medication, call the clinic.   BELOW ARE SYMPTOMS THAT SHOULD BE REPORTED IMMEDIATELY:  *FEVER GREATER THAN 100.5 F  *CHILLS WITH OR WITHOUT FEVER  NAUSEA AND VOMITING THAT IS NOT CONTROLLED WITH YOUR NAUSEA MEDICATION  *UNUSUAL SHORTNESS OF BREATH  *UNUSUAL BRUISING OR BLEEDING  TENDERNESS IN MOUTH AND THROAT WITH OR WITHOUT PRESENCE OF ULCERS  *URINARY PROBLEMS  *BOWEL PROBLEMS  UNUSUAL RASH Items with * indicate a potential emergency and should be followed up as soon as possible.  Feel free to call the clinic you have any questions or concerns. The clinic phone number is [phone].  Please show the CHEMO ALERT CARD at check-in to the Emergency Department and triage nurse.   

## 2017-02-13 ENCOUNTER — Encounter: Payer: Self-pay | Admitting: *Deleted

## 2017-02-13 ENCOUNTER — Telehealth: Payer: Self-pay | Admitting: *Deleted

## 2017-02-13 NOTE — Telephone Encounter (Signed)
Oncology Nurse Navigator Documentation  Oncology Nurse Navigator Flowsheets 02/13/2017  Navigator Location [facility]  Navigator Encounter Type Telephone/Ms. Dack called. She states her CT scan has not been scheduled.  I updated authorization coordinator, Gaspar Bidding.  I asked that she expidite authorization.  I called Ms.  Botello to update.  She was thankful for the call.   Telephone Outgoing Call  Treatment Phase Treatment  Barriers/Navigation Needs Coordination of Care  Interventions Coordination of Care  Coordination of Care Other  Acuity Level 2  Time Spent with Patient 30

## 2017-02-13 NOTE — Progress Notes (Signed)
Authorization coordinator updated me that CT scan is approved.  I called patient and gave her central scheduling number to call to schedule scan.

## 2017-02-17 ENCOUNTER — Ambulatory Visit (HOSPITAL_COMMUNITY)
Admission: RE | Admit: 2017-02-17 | Discharge: 2017-02-17 | Disposition: A | Discharge status: Home / Self Care | Payer: Medicare Other | Source: Ambulatory Visit | Attending: Internal Medicine | Admitting: Internal Medicine

## 2017-02-17 ENCOUNTER — Ambulatory Visit: Payer: Medicare Other | Admitting: Internal Medicine

## 2017-02-17 ENCOUNTER — Ambulatory Visit: Payer: Medicare Other

## 2017-02-17 ENCOUNTER — Other Ambulatory Visit: Payer: Medicare Other

## 2017-02-17 DIAGNOSIS — Z902 Acquired absence of lung [part of]: Secondary | ICD-10-CM

## 2017-02-17 DIAGNOSIS — C3491 Malignant neoplasm of unspecified part of right bronchus or lung: Secondary | ICD-10-CM | POA: Insufficient documentation

## 2017-02-17 DIAGNOSIS — R918 Other nonspecific abnormal finding of lung field: Secondary | ICD-10-CM

## 2017-02-17 DIAGNOSIS — K7460 Unspecified cirrhosis of liver: Secondary | ICD-10-CM | POA: Insufficient documentation

## 2017-02-17 DIAGNOSIS — Z923 Personal history of irradiation: Secondary | ICD-10-CM | POA: Insufficient documentation

## 2017-02-17 DIAGNOSIS — Z5112 Encounter for antineoplastic immunotherapy: Secondary | ICD-10-CM

## 2017-02-17 DIAGNOSIS — N179 Acute kidney failure, unspecified: Secondary | ICD-10-CM | POA: Diagnosis not present

## 2017-02-17 MED ORDER — IOPAMIDOL (ISOVUE-300) INJECTION 61%
INTRAVENOUS | Status: AC
  Filled 2017-02-17: qty 30

## 2017-02-17 MED ORDER — IOPAMIDOL (ISOVUE-300) INJECTION 61%
100.0000 mL | Freq: Once | INTRAVENOUS | Status: AC | PRN
  Administered 2017-02-17: 100 mL via INTRAVENOUS

## 2017-02-18 ENCOUNTER — Encounter: Payer: Self-pay | Admitting: Oncology

## 2017-02-18 ENCOUNTER — Ambulatory Visit: Payer: Medicare Other

## 2017-02-18 ENCOUNTER — Encounter (HOSPITAL_COMMUNITY): Payer: Self-pay | Admitting: Obstetrics and Gynecology

## 2017-02-18 ENCOUNTER — Other Ambulatory Visit (HOSPITAL_BASED_OUTPATIENT_CLINIC_OR_DEPARTMENT_OTHER): Payer: Medicare Other

## 2017-02-18 ENCOUNTER — Emergency Department (HOSPITAL_COMMUNITY): Payer: Medicare Other

## 2017-02-18 ENCOUNTER — Inpatient Hospital Stay (HOSPITAL_COMMUNITY)
Admission: EM | Admit: 2017-02-18 | Discharge: 2017-02-20 | DRG: 683 | Disposition: A | Discharge status: Home / Self Care | Payer: Medicare Other | Attending: Internal Medicine | Admitting: Internal Medicine

## 2017-02-18 ENCOUNTER — Ambulatory Visit (HOSPITAL_BASED_OUTPATIENT_CLINIC_OR_DEPARTMENT_OTHER): Payer: Medicare Other

## 2017-02-18 ENCOUNTER — Ambulatory Visit (HOSPITAL_BASED_OUTPATIENT_CLINIC_OR_DEPARTMENT_OTHER): Payer: Medicare Other | Admitting: Oncology

## 2017-02-18 VITALS — BP 159/67 | HR 86 | Resp 18

## 2017-02-18 VITALS — BP 164/64 | HR 75 | Temp 98.1°F | Resp 18 | Ht 62.0 in | Wt 148.6 lb

## 2017-02-18 DIAGNOSIS — N179 Acute kidney failure, unspecified: Principal | ICD-10-CM | POA: Diagnosis present

## 2017-02-18 DIAGNOSIS — C3411 Malignant neoplasm of upper lobe, right bronchus or lung: Secondary | ICD-10-CM

## 2017-02-18 DIAGNOSIS — I951 Orthostatic hypotension: Secondary | ICD-10-CM | POA: Diagnosis present

## 2017-02-18 DIAGNOSIS — C773 Secondary and unspecified malignant neoplasm of axilla and upper limb lymph nodes: Secondary | ICD-10-CM

## 2017-02-18 DIAGNOSIS — M545 Low back pain, unspecified: Secondary | ICD-10-CM

## 2017-02-18 DIAGNOSIS — Z5112 Encounter for antineoplastic immunotherapy: Secondary | ICD-10-CM

## 2017-02-18 DIAGNOSIS — C3490 Malignant neoplasm of unspecified part of unspecified bronchus or lung: Secondary | ICD-10-CM

## 2017-02-18 DIAGNOSIS — R531 Weakness: Secondary | ICD-10-CM

## 2017-02-18 DIAGNOSIS — C3491 Malignant neoplasm of unspecified part of right bronchus or lung: Secondary | ICD-10-CM

## 2017-02-18 DIAGNOSIS — I480 Paroxysmal atrial fibrillation: Secondary | ICD-10-CM | POA: Diagnosis present

## 2017-02-18 DIAGNOSIS — K7460 Unspecified cirrhosis of liver: Secondary | ICD-10-CM | POA: Diagnosis present

## 2017-02-18 DIAGNOSIS — E860 Dehydration: Secondary | ICD-10-CM | POA: Diagnosis present

## 2017-02-18 DIAGNOSIS — D6481 Anemia due to antineoplastic chemotherapy: Secondary | ICD-10-CM | POA: Diagnosis present

## 2017-02-18 DIAGNOSIS — A419 Sepsis, unspecified organism: Secondary | ICD-10-CM

## 2017-02-18 DIAGNOSIS — R112 Nausea with vomiting, unspecified: Secondary | ICD-10-CM

## 2017-02-18 DIAGNOSIS — R6510 Systemic inflammatory response syndrome (SIRS) of non-infectious origin without acute organ dysfunction: Secondary | ICD-10-CM | POA: Diagnosis present

## 2017-02-18 LAB — URINALYSIS, ROUTINE W REFLEX MICROSCOPIC
Bilirubin Urine: NEGATIVE
Glucose, UA: 150 mg/dL — AB
Hgb urine dipstick: NEGATIVE
Ketones, ur: NEGATIVE mg/dL
LEUKOCYTES UA: NEGATIVE
NITRITE: NEGATIVE
PH: 7 (ref 5.0–8.0)
Protein, ur: NEGATIVE mg/dL
SPECIFIC GRAVITY, URINE: 1.012 (ref 1.005–1.030)

## 2017-02-18 LAB — COMPREHENSIVE METABOLIC PANEL
ALBUMIN: 3.7 g/dL (ref 3.5–5.0)
ALK PHOS: 109 U/L (ref 38–126)
ALT: 30 U/L (ref 0–55)
ALT: 33 U/L (ref 14–54)
ANION GAP: 10 (ref 5–15)
AST: 35 U/L — ABNORMAL HIGH (ref 5–34)
AST: 43 U/L — ABNORMAL HIGH (ref 15–41)
Albumin: 4.3 g/dL (ref 3.5–5.0)
Alkaline Phosphatase: 110 U/L (ref 40–150)
Anion Gap: 8 mEq/L (ref 3–11)
BILIRUBIN TOTAL: 1.56 mg/dL — AB (ref 0.20–1.20)
BUN: 8.7 mg/dL (ref 7.0–26.0)
BUN: 10 mg/dL (ref 6–20)
CALCIUM: 9.5 mg/dL (ref 8.4–10.4)
CALCIUM: 9.3 mg/dL (ref 8.9–10.3)
CO2: 26 meq/L (ref 22–29)
CO2: 26 mmol/L (ref 22–32)
CREATININE: 0.7 mg/dL (ref 0.6–1.1)
Chloride: 106 mEq/L (ref 98–109)
Chloride: 102 mmol/L (ref 101–111)
Creatinine, Ser: 0.6 mg/dL (ref 0.44–1.00)
EGFR: 80 mL/min/{1.73_m2} — AB (ref >90)
GLUCOSE: 142 mg/dL — AB (ref 70–140)
Glucose, Bld: 188 mg/dL — ABNORMAL HIGH (ref 65–99)
POTASSIUM: 4.1 meq/L (ref 3.5–5.1)
Potassium: 4 mmol/L (ref 3.5–5.1)
SODIUM: 140 meq/L (ref 136–145)
SODIUM: 138 mmol/L (ref 135–145)
TOTAL PROTEIN: 7.7 g/dL (ref 6.5–8.1)
Total Bilirubin: 1.8 mg/dL — ABNORMAL HIGH (ref 0.3–1.2)
Total Protein: 6.8 g/dL (ref 6.4–8.3)

## 2017-02-18 LAB — CBC WITH DIFFERENTIAL/PLATELET
BASO%: 0.9 % (ref 0.0–2.0)
BASOS ABS: 0 10*3/uL (ref 0.0–0.1)
BASOS ABS: 0 10*3/uL (ref 0.0–0.1)
BASOS PCT: 0 %
EOS ABS: 0.2 10*3/uL (ref 0.0–0.5)
EOS ABS: 0 10*3/uL (ref 0.0–0.7)
EOS%: 3.7 % (ref 0.0–7.0)
Eosinophils Relative: 0 %
HEMATOCRIT: 38.2 % (ref 34.8–46.6)
HEMATOCRIT: 39.2 % (ref 36.0–46.0)
HEMOGLOBIN: 13.4 g/dL (ref 11.6–15.9)
HEMOGLOBIN: 13.8 g/dL (ref 12.0–15.0)
LYMPH%: 23 % (ref 14.0–49.7)
Lymphocytes Relative: 8 %
Lymphs Abs: 0.5 10*3/uL — ABNORMAL LOW (ref 0.7–4.0)
MCH: 34 pg (ref 25.1–34.0)
MCH: 33.7 pg (ref 26.0–34.0)
MCHC: 35 g/dL (ref 31.5–36.0)
MCHC: 35.2 g/dL (ref 30.0–36.0)
MCV: 97.3 fL (ref 79.5–101.0)
MCV: 95.8 fL (ref 78.0–100.0)
MONO#: 0.4 10*3/uL (ref 0.1–0.9)
MONO%: 9.3 % (ref 0.0–14.0)
MONOS PCT: 3 %
Monocytes Absolute: 0.2 10*3/uL (ref 0.1–1.0)
NEUT#: 2.6 10*3/uL (ref 1.5–6.5)
NEUT%: 63.1 % (ref 38.4–76.8)
NEUTROS ABS: 5.8 10*3/uL (ref 1.7–7.7)
NEUTROS PCT: 89 %
Platelets: 147 10*3/uL (ref 145–400)
Platelets: 139 10*3/uL — ABNORMAL LOW (ref 150–400)
RBC: 3.93 10*6/uL (ref 3.70–5.45)
RBC: 4.09 MIL/uL (ref 3.87–5.11)
RDW: 13.5 % (ref 11.2–14.5)
RDW: 13.3 % (ref 11.5–15.5)
WBC: 4.2 10*3/uL (ref 3.9–10.3)
WBC: 6.5 10*3/uL (ref 4.0–10.5)
lymph#: 1 10*3/uL (ref 0.9–3.3)

## 2017-02-18 LAB — WHOLE BLOOD GLUCOSE
Glucose: 134 mg/dL — ABNORMAL HIGH (ref 70–100)
HRS PC: 6 Hours

## 2017-02-18 LAB — I-STAT CG4 LACTIC ACID, ED
LACTIC ACID, VENOUS: 3.29 mmol/L — AB (ref 0.5–1.9)
Lactic Acid, Venous: 4.34 mmol/L (ref 0.5–1.9)

## 2017-02-18 MED ORDER — ONDANSETRON HCL 4 MG/2ML IJ SOLN
INTRAMUSCULAR | Status: AC
  Filled 2017-02-18: qty 2

## 2017-02-18 MED ORDER — ENSURE ENLIVE PO LIQD
237.0000 mL | Freq: Two times a day (BID) | ORAL | Status: DC
  Administered 2017-02-18: 237 mL via ORAL
  Filled 2017-02-18: qty 237

## 2017-02-18 MED ORDER — SODIUM CHLORIDE 0.9 % IV SOLN: Freq: Once | INTRAVENOUS | Status: DC

## 2017-02-18 MED ORDER — VANCOMYCIN HCL IN DEXTROSE 1-5 GM/200ML-% IV SOLN
1000.0000 mg | INTRAVENOUS | Status: AC
  Administered 2017-02-18: 1000 mg via INTRAVENOUS
  Filled 2017-02-18: qty 200

## 2017-02-18 MED ORDER — DEXTROSE 5 % IV SOLN
2.0000 g | Freq: Three times a day (TID) | INTRAVENOUS | Status: DC
  Filled 2017-02-18: qty 2

## 2017-02-18 MED ORDER — HYDROMORPHONE HCL 1 MG/ML IJ SOLN
INTRAMUSCULAR | Status: AC
  Filled 2017-02-18: qty 1

## 2017-02-18 MED ORDER — AZTREONAM 2 G IJ SOLR
2.0000 g | INTRAMUSCULAR | Status: AC
  Administered 2017-02-18: 2 g via INTRAVENOUS
  Filled 2017-02-18: qty 2

## 2017-02-18 MED ORDER — FAMOTIDINE IN NACL 20-0.9 MG/50ML-% IV SOLN
20.0000 mg | Freq: Once | INTRAVENOUS | Status: AC
  Administered 2017-02-18: 20 mg via INTRAVENOUS

## 2017-02-18 MED ORDER — LEVOFLOXACIN IN D5W 750 MG/150ML IV SOLN
750.0000 mg | INTRAVENOUS | Status: AC
  Administered 2017-02-18: 750 mg via INTRAVENOUS
  Filled 2017-02-18: qty 150

## 2017-02-18 MED ORDER — SODIUM CHLORIDE 0.9 % IV SOLN
240.0000 mg | Freq: Once | INTRAVENOUS | Status: AC
  Administered 2017-02-18: 240 mg via INTRAVENOUS
  Filled 2017-02-18: qty 24

## 2017-02-18 MED ORDER — SODIUM CHLORIDE 0.9 % IV BOLUS (SEPSIS)
1000.0000 mL | Freq: Once | INTRAVENOUS | Status: AC
  Administered 2017-02-18: 1000 mL via INTRAVENOUS

## 2017-02-18 MED ORDER — SODIUM CHLORIDE 0.9 % IV BOLUS (SEPSIS)
250.0000 mL | Freq: Once | INTRAVENOUS | Status: AC
  Administered 2017-02-18: 250 mL via INTRAVENOUS

## 2017-02-18 MED ORDER — SODIUM CHLORIDE 0.9 % IV SOLN
Freq: Once | INTRAVENOUS | Status: AC
  Administered 2017-02-18: 12:00:00 via INTRAVENOUS

## 2017-02-18 MED ORDER — PROCHLORPERAZINE EDISYLATE 5 MG/ML IJ SOLN
5.0000 mg | Freq: Once | INTRAMUSCULAR | Status: AC
  Administered 2017-02-18: 5 mg via INTRAVENOUS
  Filled 2017-02-18: qty 2

## 2017-02-18 MED ORDER — VANCOMYCIN HCL 500 MG IV SOLR
500.0000 mg | Freq: Two times a day (BID) | INTRAVENOUS | Status: DC
  Filled 2017-02-18: qty 500

## 2017-02-18 MED ORDER — HYDROMORPHONE HCL 1 MG/ML IJ SOLN
1.0000 mg | Freq: Once | INTRAMUSCULAR | Status: AC
Start: 2017-02-18 — End: 2017-02-18
  Administered 2017-02-18: 1 mg via INTRAVENOUS

## 2017-02-18 MED ORDER — ENSURE ENLIVE PO LIQD: 237.0000 mL | Freq: Two times a day (BID) | ORAL | Status: DC

## 2017-02-18 MED ORDER — ONDANSETRON HCL 4 MG/2ML IJ SOLN
8.0000 mg | Freq: Once | INTRAMUSCULAR | Status: AC
  Administered 2017-02-18: 8 mg via INTRAVENOUS

## 2017-02-18 MED ORDER — FAMOTIDINE IN NACL 20-0.9 MG/50ML-% IV SOLN
INTRAVENOUS | Status: AC
  Filled 2017-02-18: qty 50

## 2017-02-18 MED ORDER — LEVOFLOXACIN IN D5W 750 MG/150ML IV SOLN: 750.0000 mg | INTRAVENOUS | Status: DC

## 2017-02-18 NOTE — ED Triage Notes (Signed)
Pt arrival from cancer center.  Nurse reports the pt was receiving chemo therapy and started screaming in pain saying she was having a burning sensation down her back and in her legs. Nurse reports that the pr proceeded to vomit approximately 5 times with emesis around 569mls.  Nurse reports patients CBG was 134 Pt was given 8mg  of Zofran, 20mg  of Pepcid and 1 mg of Dilaudid with no relief to the N/V.  Pt has a pacemaker

## 2017-02-18 NOTE — ED Notes (Signed)
Pt given orange juice, Pt requested an Ensure, MD made aware

## 2017-02-18 NOTE — Progress Notes (Signed)
Per Dr. Julien Nordmann, okay to treat patient with abnormal total bili of 1.56.

## 2017-02-18 NOTE — Progress Notes (Signed)
Asked to see patient and infusion during her Nivolumab infusion. Patient was complaining of severe back pain. Her Nivolumab infusion was placed on hold. When I saw the patient, she was complaining of pain in her lower back that radiated down her legs. She denied any other symptoms including chest tightness or pain, shortness of breath, wheezing, abdominal pain, nausea, and vomiting. The patient's pain improved when she stood up. Due to the severe nature of her pain she was given Dilaudid 1 mg IV with quick improvement in her pain. However, she developed nausea. She was given Zofran 8 mg IV along with Pepcid 20 mg IV. The patient did vomit shortly after Zofran, but she reported that she was starting to feel better and wanted to continue with her immunotherapy.  The Nivolumab was continued and completed. However, the patient continued to complain of nausea and vomiting. Vital signs remained stable. Nursing noted the patient was still not acting well and appeared pale. It was unclear what the cause of her symptoms were in the symptoms were not thought to be related to a reaction to her Nivolumab. This information was reported to the physician who recommended the patient be evaluated in the emergency room.

## 2017-02-18 NOTE — Progress Notes (Signed)
[place] [phone]   [phone]  OFFICE PROGRESS NOTE  [name] [address] [place] [address]  DIAGNOSIS: Metastatic non-small cell lung cancer, adenocarcinoma. This was initially diagnosed as stage IB (T2a, N0, M0) in March of 2014, status post right upper lobectomy with lymph node dissection but the patient has evidence for disease recurrence in the right axilla status post resection.  PRIOR THERAPY:  1) Status post right axillary lymph node biopsy on 05/01/2014 under the care of Dr. Roxan Hockey. 2) Systemic chemotherapy with carboplatin for AUC of 5 and Alimta 500 mg/M2 every 3 weeks. First cycle 05/31/2014. Status post 6 cycles. Last dose was given 09/22/2014. 3) palliative radiotherapy to the subcarinal lymph node under the care of Dr. Sondra Come  CURRENT THERAPY:  Nivolumab 240 mg IV every 2 weeks. First dose 12/23/2016. Status post 4 cycles.  INTERVAL HISTORY: Nancy Hodges 71 y.o. female returns to the clinic today for follow-up visit by herself. The patient is currently on treatment with immunotherapy with Nivolumab and is still tolerating it fairly well. She denied having any chest pain, shortness of breath, cough or hemoptysis. She denied having any weight loss or night sweats. She has no nausea, vomiting, diarrhea or constipation. The patient denied having any fever or chills. The patient had a recent restaging CT scan is here to discuss the results and for evaluation before starting cycle #5 of her treatment.   MEDICAL HISTORY: Past Medical History:  Diagnosis Date  . Allergic rhinitis   . Anxiety   . Arthritis   . Asthma   . Atrial flutter ([place])   . Cholelithiases 09/26/2015  . Diverticulosis   . Encounter for antineoplastic immunotherapy 12/09/2016  . GERD (gastroesophageal reflux disease)   . H/O hiatal hernia   . History of kidney stones   . Internal hemorrhoid   . Lung cancer ([facility])    a. Stage  IB non-small cell carcinoma, s/p R VATS, wedge resection, RU lobectomy 10/2012.  [name] Other specified diabetes mellitus with hyperglycemia ([facility])    hx of chemo induced high blood sugar  . Paroxysmal atrial fibrillation ([facility])    a. anticoagulated with Xarelto  . Pneumonia 06/2016   [facility]  . Pulmonary nodule   . Tachycardia-bradycardia syndrome ([place])    a. 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    ALLERGIES:  is allergic to prednisone; septra [sulfamethoxazole-trimethoprim]; penicillins; and vioxx [rofecoxib].  MEDICATIONS:  Current Outpatient Prescriptions  Medication Sig Dispense Refill  . acetaminophen (TYLENOL) 500 MG tablet Take 500 mg by mouth every 6 (six) hours as needed for moderate pain or fever.     [name] albuterol (PROVENTIL HFA;VENTOLIN HFA) 108 (90 BASE) MCG/ACT inhaler Inhale 1-2 puffs into the lungs every 6 (six) hours as needed for wheezing or shortness of breath.    . diltiazem (CARDIZEM CD) 240 MG 24 hr capsule Take 1 capsule (240 mg total) by mouth daily. 30 capsule 11  . fexofenadine (ALLEGRA) 180 MG tablet Take 180 mg by mouth daily.    [name] ipratropium-albuterol (DUONEB) 0.5-2.5 (3) MG/3ML SOLN Take 3 mLs by nebulization every 6 (six) hours as needed (shortness of breath or wheezing).     . meclizine (ANTIVERT) 12.5 MG tablet Take 12.5 mg by mouth 3 (three) times daily as needed for dizziness.    . Multiple Vitamin (MULTIVITAMIN) tablet Take 1 tablet by mouth daily.    . multivitamin-iron-minerals-folic acid (CENTRUM) chewable tablet Chew  1 tablet by mouth daily.    . rivaroxaban (XARELTO) 20 MG TABS tablet Take 1 tablet (20 mg total) by mouth daily with supper. 30 tablet 11   No current facility-administered medications for this visit.     SURGICAL HISTORY:  Past Surgical History:  Procedure Laterality Date  . CATARACT EXTRACTION W/PHACO Right 08/14/2016   Procedure: CATARACT EXTRACTION PHACO AND INTRAOCULAR LENS PLACEMENT RIGHT EYE CDE 10.53;  Surgeon: Tonny Branch, MD;  Location: [facility];  Service: Ophthalmology;  Laterality: Right;  right  . CATARACT EXTRACTION W/PHACO Left 09/25/2016   Procedure: CATARACT EXTRACTION PHACO AND INTRAOCULAR LENS PLACEMENT (IOC);  Surgeon: Tonny Branch, MD;  Location: [facility];  Service: Ophthalmology;  Laterality: Left;  left CDE 8.81  . COLONOSCOPY     [facility]: cattered sigmoid diverticula, internal hemorrhoids, sessile polyp in ascending and mid transverse colon (tubular adenoma).   . colonoscopy with polypectomy  2015  . ESOPHAGOGASTRODUODENOSCOPY     [facility]: bile reflux in stomach, mild gastritis, hiatal hernia  . ESOPHAGOGASTRODUODENOSCOPY N/A 01/31/2016   Procedure: ESOPHAGOGASTRODUODENOSCOPY (EGD);  Surgeon: [name], MD;  Location: [facility];  Service: Endoscopy;  Laterality: N/A;  1200  . LOBECTOMY Right 10/27/2012   Procedure: LOBECTOMY;  Surgeon: [name], MD;  Location: [facility];  Service: Thoracic;  Laterality: Right;   RIGHT UPPER LOBECTOMY & Node Dissection  . LYMPH NODE BIOPSY Right 05/01/2014   Procedure: LYMPH NODE BIOPSY, Right Axillary;  Surgeon: [name], MD;  Location: [facility];  Service: Thoracic;  Laterality: Right;  . PARTIAL HYSTERECTOMY    . PERMANENT PACEMAKER INSERTION N/A 09/14/2012   Nanostim (SJM) leadless pacemaker (LEADLESS II STUDY PATIENT) implanted by Dr [name]  . VIDEO ASSISTED THORACOSCOPY (VATS)/WEDGE RESECTION Right 10/27/2012   Procedure: VIDEO ASSISTED THORACOSCOPY (VATS),RGHT UPPER LOBE LUNG WEDGE RESECTION;  Surgeon: [name], MD;  Location: [facility];  Service: Thoracic;  Laterality: Right;    REVIEW OF SYSTEMS:  A comprehensive review of systems was negative.   PHYSICAL EXAMINATION: General appearance: alert, cooperative and no distress Head: Normocephalic, without obvious abnormality, atraumatic Neck: no adenopathy, no JVD, supple, symmetrical, trachea midline and thyroid not enlarged, symmetric, no tenderness/mass/nodules Lymph nodes:  Cervical, supraclavicular, and axillary nodes normal. Resp: clear to auscultation bilaterally Back: symmetric, no curvature. ROM normal. No CVA tenderness. Cardio: regular rate and rhythm, S1, S2 normal, no murmur, click, rub or gallop GI: soft, non-tender; bowel sounds normal; no masses,  no organomegaly Extremities: extremities normal, atraumatic, no cyanosis or edema  ECOG PERFORMANCE STATUS: 0 - Asymptomatic  Blood pressure (!) 164/64, pulse 75, temperature 98.1 F (36.7 C), temperature source Oral, resp. rate 18, height 5\' 2"  (1.575 m), weight 148 lb 9.6 oz (67.4 kg), SpO2 98 %.  LABORATORY DATA: Lab Results  Component Value Date   WBC 4.2 02/18/2017   HGB 13.4 02/18/2017   HCT 38.2 02/18/2017   MCV 97.3 02/18/2017   PLT 147 02/18/2017      Chemistry      Component Value Date/Time   NA 140 02/18/2017 0946   K 4.1 02/18/2017 0946   CL 107 08/12/2016 0937   CO2 26 02/18/2017 0946   BUN 8.7 02/18/2017 0946   CREATININE 0.7 02/18/2017 0946      Component Value Date/Time   CALCIUM 9.5 02/18/2017 0946   ALKPHOS 110 02/18/2017 0946   AST 35 (H) 02/18/2017 0946   ALT 30 02/18/2017 0946   BILITOT 1.56 (H) 02/18/2017 1950  RADIOGRAPHIC STUDIES: Ct Chest W Contrast  Result Date: 02/17/2017 CLINICAL DATA:  Non-small cell right lung cancer, stage IV, diagnosed 2013 status post right lobe wedge resection, chemotherapy, and XRT (completed 05/2016). Currently receiving chemotherapy. EXAM: CT CHEST, ABDOMEN, AND PELVIS WITH CONTRAST TECHNIQUE: Multidetector CT imaging of the chest, abdomen and pelvis was performed following the standard protocol during bolus administration of intravenous contrast. CONTRAST:  136mL ISOVUE-300 IOPAMIDOL (ISOVUE-300) INJECTION 61% COMPARISON:  12/01/2016 FINDINGS: CT CHEST FINDINGS Cardiovascular: Heart is normal in size.  No pericardial effusion. Coronary atherosclerosis the LAD and left circumflex. No evidence of thoracic aortic aneurysm. Mild  atherosclerotic calcifications of the aortic arch. Mediastinum/Nodes: No suspicious mediastinal, hilar, or axillary lymphadenopathy. Visualized thyroid is unremarkable. Lungs/Pleura: Status post right upper lobectomy. Near complete resolution of prior bilateral pulmonary nodules. Residual nodular scarring along the posterior aspect of the right middle lobe (series 6/ image 49). Residual nodular scarring in the posterior left upper lobe (series 6/image 72). Residual nodular scarring in the anterior right lower lobe (series 6/ image 90) and in the medial right lung base (series 6/image 105). Stable radiation changes in the medial right lower lobe (series 6/ image 52). Mild left apical pleural-parenchymal scarring. Mild subpleural scarring along the lateral right lower lobe. No focal consolidation. No pleural effusion or pneumothorax. Musculoskeletal: Mild degenerative changes of the thoracic spine. CT ABDOMEN PELVIS FINDINGS Hepatobiliary: Mildly nodular hepatic contour, suggesting cirrhosis. No focal hepatic lesion is seen. Layering gallstones (series 2/ image 61), without associated inflammatory changes. Pancreas: Within normal limits. Spleen: Spleen is mildly enlarged, measuring 15.1 cm in maximal axial dimension. 10 mm splenic cyst posteriorly (series 2/image 60). Adrenals/Urinary Tract: Adrenal glands are within normal limits. Kidneys are within normal limits.  No hydronephrosis. Bladder is within normal limits. Stomach/Bowel: Stomach is within normal limits. No evidence of bowel obstruction. Normal appendix (series 2/image 82). Mild sigmoid diverticulosis, without evidence of diverticulitis. Vascular/Lymphatic: No evidence of abdominal aortic aneurysm. Atherosclerotic calcifications of the abdominal aorta and branch vessels. Portal vein is patent. Recanalized periumbilical vein (series 2/image 59). Small perigastric varices. No suspicious abdominopelvic lymphadenopathy. Small upper abdominal lymph nodes  measuring up to 7 mm short axis, within normal limits. Reproductive: Status post hysterectomy. Bilateral ovaries are within normal limits. Other: No abdominopelvic ascites. Musculoskeletal: Mild degenerative changes of the lumbar spine. IMPRESSION: Status post right upper lobectomy. Radiation changes in the medial right lower lobe. Prior bilateral pulmonary nodules/ metastases have essentially resolved, with minimal residual nodular scarring. No evidence of metastatic disease in the abdomen/pelvis. Cirrhosis with stigmata of portal hypertension. Portal vein is patent. Additional stable ancillary findings as above. Electronically Signed   By: Julian Hy M.D.   On: 02/17/2017 15:08   Ct Abdomen Pelvis W Contrast  Result Date: 02/17/2017 CLINICAL DATA:  Non-small cell right lung cancer, stage IV, diagnosed 2013 status post right lobe wedge resection, chemotherapy, and XRT (completed 05/2016). Currently receiving chemotherapy. EXAM: CT CHEST, ABDOMEN, AND PELVIS WITH CONTRAST TECHNIQUE: Multidetector CT imaging of the chest, abdomen and pelvis was performed following the standard protocol during bolus administration of intravenous contrast. CONTRAST:  112mL ISOVUE-300 IOPAMIDOL (ISOVUE-300) INJECTION 61% COMPARISON:  12/01/2016 FINDINGS: CT CHEST FINDINGS Cardiovascular: Heart is normal in size.  No pericardial effusion. Coronary atherosclerosis the LAD and left circumflex. No evidence of thoracic aortic aneurysm. Mild atherosclerotic calcifications of the aortic arch. Mediastinum/Nodes: No suspicious mediastinal, hilar, or axillary lymphadenopathy. Visualized thyroid is unremarkable. Lungs/Pleura: Status post right upper lobectomy. Near complete resolution of prior bilateral pulmonary  nodules. Residual nodular scarring along the posterior aspect of the right middle lobe (series 6/ image 49). Residual nodular scarring in the posterior left upper lobe (series 6/image 72). Residual nodular scarring in the  anterior right lower lobe (series 6/ image 90) and in the medial right lung base (series 6/image 105). Stable radiation changes in the medial right lower lobe (series 6/ image 52). Mild left apical pleural-parenchymal scarring. Mild subpleural scarring along the lateral right lower lobe. No focal consolidation. No pleural effusion or pneumothorax. Musculoskeletal: Mild degenerative changes of the thoracic spine. CT ABDOMEN PELVIS FINDINGS Hepatobiliary: Mildly nodular hepatic contour, suggesting cirrhosis. No focal hepatic lesion is seen. Layering gallstones (series 2/ image 61), without associated inflammatory changes. Pancreas: Within normal limits. Spleen: Spleen is mildly enlarged, measuring 15.1 cm in maximal axial dimension. 10 mm splenic cyst posteriorly (series 2/image 60). Adrenals/Urinary Tract: Adrenal glands are within normal limits. Kidneys are within normal limits.  No hydronephrosis. Bladder is within normal limits. Stomach/Bowel: Stomach is within normal limits. No evidence of bowel obstruction. Normal appendix (series 2/image 82). Mild sigmoid diverticulosis, without evidence of diverticulitis. Vascular/Lymphatic: No evidence of abdominal aortic aneurysm. Atherosclerotic calcifications of the abdominal aorta and branch vessels. Portal vein is patent. Recanalized periumbilical vein (series 2/image 59). Small perigastric varices. No suspicious abdominopelvic lymphadenopathy. Small upper abdominal lymph nodes measuring up to 7 mm short axis, within normal limits. Reproductive: Status post hysterectomy. Bilateral ovaries are within normal limits. Other: No abdominopelvic ascites. Musculoskeletal: Mild degenerative changes of the lumbar spine. IMPRESSION: Status post right upper lobectomy. Radiation changes in the medial right lower lobe. Prior bilateral pulmonary nodules/ metastases have essentially resolved, with minimal residual nodular scarring. No evidence of metastatic disease in the  abdomen/pelvis. Cirrhosis with stigmata of portal hypertension. Portal vein is patent. Additional stable ancillary findings as above. Electronically Signed   By: Julian Hy M.D.   On: 02/17/2017 15:08   ASSESSMENT AND PLAN:  This is a very pleasant 71 years old white female with metastatic non-small cell lung cancer, adenocarcinoma with no actionable mutations status post systemic chemotherapy with carboplatin and Alimta and she is currently undergoing treatment with second line immunotherapy with Nivolumab status post 4 cycles. The patient is tolerating the treatment well with no significant adverse effects.   The patient was seen with Dr. Julien Nordmann. Restaging CT scan results were discussed with the patient which shows improvement of disease. Recommend that she proceed with cycle 5 of Nivolumab today as a scheduled. She will follow-up in 2 weeks for cycle 6 of Nivolumab.  She was advised to call immediately if she has any concerning symptoms in the interval. The patient voices understanding of current disease status and treatment options and is in agreement with the current care plan. All questions were answered. The patient knows to call the clinic with any problems, questions or concerns. We can certainly see the patient much sooner if necessary.   [name], DNP, AGPCNP-BC, AOCNP   ADDENDUM: Hematology/Oncology Attending: I had a face to face encounter with the patient. I recommended her care plan. This is a very pleasant 71 years old white female with metastatic non-small cell lung cancer, adenocarcinoma with no actionable mutations status post systemic chemotherapy in the past with carboplatin and Alimta. She is currently undergoing second line treatment with immunotherapy with Nivolumab status post 4 cycles and has been treating this treatment fairly well. The patient had repeat CT scan of the chest, abdomen and pelvis performed recently. I personally and independently  reviewed  the scan images and discuss the results with the patient today. Her scan showed improvement of her disease with resolution of the bilateral pulmonary nodules and no evidence of metastatic disease in the abdomen or pelvis. I recommended for the patient to continue her current treatment with Nivolumab and she will proceed with cycle #5 today. I would see her back for follow-up visit in 2 weeks for evaluation before starting the next cycle of her treatment. During her treatment in the chemotherapy room the patient complained of low back pain and she was given a dose of Dilaudid but this was followed by nausea and vomiting. She was sent to the emergency department for further evaluation and stabilization. The patient was advised to call if she has any other concerning symptoms in the interval. Disclaimer: This note was dictated with voice recognition software. Similar sounding words can inadvertently be transcribed and may be missed upon review. Eilleen Kempf., MD 02/19/17

## 2017-02-18 NOTE — ED Notes (Signed)
Bed: WA03 Expected date:  Expected time:  Means of arrival:  Comments: [place] pt.

## 2017-02-18 NOTE — Patient Instructions (Signed)
[facility] Discharge Instructions for Patients Receiving Chemotherapy  Today you received the following chemotherapy agents:  Nivolumab.  To help prevent nausea and vomiting after your treatment, we encourage you to take your nausea medication as directed.   If you develop nausea and vomiting that is not controlled by your nausea medication, call the clinic.   BELOW ARE SYMPTOMS THAT SHOULD BE REPORTED IMMEDIATELY:  *FEVER GREATER THAN 100.5 F  *CHILLS WITH OR WITHOUT FEVER  NAUSEA AND VOMITING THAT IS NOT CONTROLLED WITH YOUR NAUSEA MEDICATION  *UNUSUAL SHORTNESS OF BREATH  *UNUSUAL BRUISING OR BLEEDING  TENDERNESS IN MOUTH AND THROAT WITH OR WITHOUT PRESENCE OF ULCERS  *URINARY PROBLEMS  *BOWEL PROBLEMS  UNUSUAL RASH Items with * indicate a potential emergency and should be followed up as soon as possible.  Feel free to call the clinic you have any questions or concerns. The clinic phone number is [phone].  Please show the CHEMO ALERT CARD at check-in to the Emergency Department and triage nurse.   

## 2017-02-18 NOTE — Progress Notes (Signed)
Pharmacy Antibiotic Note  Nancy Hodges is a 71 y.o. female admitted on 02/18/2017 with sepsis.  Pharmacy has been consulted for Vancomycin, Levaquin, Aztreonam dosing. Sent over from [facility] after infusion reaction (sudden onset back pain radiating to bilateral upper legs) and nausea with emesis.  No known source of infection, but hypothermic and elevated lactic acid.  Temp 94.8 Lactic acid 4.34 WBC 4.2 SCr 0.7, CrCl ~ 56 ml/min   PCN allergy: swelling, rash.  No history of inpatient cephalosporin administration.  Plan:  Aztreonam 2g IV q8h  Levaquin 750 mg IV q24h  Vancomycin 1g IV x1 dose then 500mg  IV q12h.  Measure Vanc trough at steady state.  Follow up renal fxn, culture results, and clinical course.    Height: 5\' 2"  (157.5 cm) Weight: 148 lb 9 oz (67.4 kg) IBW/kg (Calculated) : 50.1  Temp (24hrs), Avg:96.5 F (35.8 C), Min:94.8 F (34.9 C), Max:98.1 F (36.7 C)   Recent Labs Lab 02/18/17 0946 02/18/17 1710  WBC 4.2  --   CREATININE 0.7  --   LATICACIDVEN  --  4.34*    Estimated Creatinine Clearance: 56.4 mL/min (by C-G formula based on SCr of 0.7 mg/dL).    Allergies  Allergen Reactions  . Prednisone Shortness Of Breath, Palpitations and Other (See Comments)    REACTION: double vision History is not consistent with any allergy.  Sarina Ill [Sulfamethoxazole-Trimethoprim] Shortness Of Breath  . Penicillins Swelling and Rash    Has patient had a PCN reaction causing immediate rash, facial/tongue/throat swelling, SOB or lightheadedness with hypotension: Yes Has patient had a PCN reaction causing severe rash involving mucus membranes or skin necrosis: No Has patient had a PCN reaction that required hospitalization No Has patient had a PCN reaction occurring within the last 10 years: No If all of the above answers are "NO", then may proceed with Cephalosporin use.   . Vioxx [Rofecoxib] Other (See Comments)    REACTION:  Stomach cramps    Thank you for  allowing pharmacy to be a part of this patient's care.  Gretta Arab PharmD, BCPS Pager [phone] 02/18/2017 5:59 PM

## 2017-02-18 NOTE — ED Provider Notes (Signed)
[facility] DEPT Provider Note   CSN: [id number] Arrival date & time: 02/18/17  1508     History   Chief Complaint Chief Complaint  Patient presents with  . Nausea  . Emesis    HPI Nancy Hodges is a 71 y.o. female.  The patient presents for evaluation after an episode at the chemotherapy infusion center.  She was receiving a new medication, for lung cancer, when she suddenly developed low back pain associated with radiation to the bilateral upper legs.  She was treated with narcotic pain medicine, intravenously then became very nauseated and vomited multiple times.  She was sent here for stabilization after this episode.  She has been receiving chemotherapy regularly recently.  She denies recent fever, chills, nausea, vomiting, cough, shortness of breath, weakness or dizziness.  She is taking her usual medications.  There are no other known modifying factors.  HPI  Past Medical History:  Diagnosis Date  . Allergic rhinitis   . Anxiety   . Arthritis   . Asthma   . Atrial flutter ([place])   . Cholelithiases 09/26/2015  . Diverticulosis   . Encounter for antineoplastic immunotherapy 12/09/2016  . GERD (gastroesophageal reflux disease)   . H/O hiatal hernia   . History of kidney stones   . Internal hemorrhoid   . Lung cancer ([place])    a. Stage IB non-small cell carcinoma, s/p R VATS, wedge resection, RU lobectomy 10/2012.  [name] Other specified diabetes mellitus with hyperglycemia ([place])    hx of chemo induced high blood sugar  . Paroxysmal atrial fibrillation ([facility])    a. anticoagulated with Xarelto  . Pneumonia 06/2016   [facility]  . Pulmonary nodule   . Tachycardia-bradycardia syndrome ([place])    a. 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    Patient Active Problem List   Diagnosis Date Noted  . Goals of care, counseling/discussion 12/09/2016  . Encounter for antineoplastic immunotherapy 12/09/2016  . Esophageal varices in other disease   . Hepatic cirrhosis  ([place]) 01/09/2016  . Elevated LFTs 11/07/2015  . Cholelithiases 09/26/2015  . Melena   . GI bleed 07/29/2014  . Lung cancer, primary, with metastasis from lung to other site [facility]) 05/05/2014  . Arthralgia 10/05/2013  . Obstructive chronic bronchitis without exacerbation Gold A  02/11/2013  . Non-small cell carcinoma of right lung, stage 4 ([place]) 09/29/2012  . Tachycardia-bradycardia syndrome ([place]) 09/06/2012  . Paroxysmal atrial fibrillation [facility])     Past Surgical History:  Procedure Laterality Date  . CATARACT EXTRACTION W/PHACO Right 08/14/2016   Procedure: CATARACT EXTRACTION PHACO AND INTRAOCULAR LENS PLACEMENT RIGHT EYE CDE 10.53;  Surgeon: Tonny Branch, MD;  Location: [facility];  Service: Ophthalmology;  Laterality: Right;  right  . CATARACT EXTRACTION W/PHACO Left 09/25/2016   Procedure: CATARACT EXTRACTION PHACO AND INTRAOCULAR LENS PLACEMENT (IOC);  Surgeon: Tonny Branch, MD;  Location: [facility];  Service: Ophthalmology;  Laterality: Left;  left CDE 8.81  . COLONOSCOPY     [facility]: cattered sigmoid diverticula, internal hemorrhoids, sessile polyp in ascending and mid transverse colon (tubular adenoma).   . colonoscopy with polypectomy  2015  . ESOPHAGOGASTRODUODENOSCOPY     [facility]: bile reflux in stomach, mild gastritis, hiatal hernia  . ESOPHAGOGASTRODUODENOSCOPY N/A 01/31/2016   Procedure: ESOPHAGOGASTRODUODENOSCOPY (EGD);  Surgeon: [name], MD;  Location: [facility];  Service: Endoscopy;  Laterality: N/A;  1200  . LOBECTOMY Right 10/27/2012   Procedure: LOBECTOMY;  Surgeon: [name], MD;  Location: [facility];  Service:  Thoracic;  Laterality: Right;   RIGHT UPPER LOBECTOMY & Node Dissection  . LYMPH NODE BIOPSY Right 05/01/2014   Procedure: LYMPH NODE BIOPSY, Right Axillary;  Surgeon: [name], MD;  Location: [facility];  Service: Thoracic;  Laterality: Right;  . PARTIAL HYSTERECTOMY    . PERMANENT PACEMAKER INSERTION N/A 09/14/2012   Nanostim (SJM) leadless  pacemaker (LEADLESS II STUDY PATIENT) implanted by Dr [name]  . VIDEO ASSISTED THORACOSCOPY (VATS)/WEDGE RESECTION Right 10/27/2012   Procedure: VIDEO ASSISTED THORACOSCOPY (VATS),RGHT UPPER LOBE LUNG WEDGE RESECTION;  Surgeon: [name], MD;  Location: [facility];  Service: Thoracic;  Laterality: Right;    OB History    Gravida Para Term Preterm AB Living   1 1 1          SAB TAB Ectopic Multiple Live Births                   Home Medications    Prior to Admission medications   Medication Sig Start Date End Date Taking? Authorizing Provider  acetaminophen (TYLENOL) 500 MG tablet Take 500 mg by mouth every 6 (six) hours as needed for moderate pain or fever.    Yes [provider]  albuterol (PROVENTIL HFA;VENTOLIN HFA) 108 (90 BASE) MCG/ACT inhaler Inhale 1-2 puffs into the lungs every 6 (six) hours as needed for wheezing or shortness of breath.   Yes [provider]  diltiazem (CARDIZEM CD) 240 MG 24 hr capsule Take 1 capsule (240 mg total) by mouth daily. 04/09/16  Yes Allred, Jeneen Rinks, MD  fexofenadine (ALLEGRA) 180 MG tablet Take 180 mg by mouth daily.   Yes [provider]  fluticasone (FLONASE) 50 MCG/ACT nasal spray Place 1 spray into both nostrils daily. 02/06/17  Yes [provider]  hydroxypropyl methylcellulose / hypromellose (ISOPTO TEARS / GONIOVISC) 2.5 % ophthalmic solution Place 1-2 drops into the left eye as needed for dry eyes.   Yes [provider]  ipratropium-albuterol (DUONEB) 0.5-2.5 (3) MG/3ML SOLN Take 3 mLs by nebulization every 6 (six) hours as needed (shortness of breath or wheezing).    Yes [provider]  meclizine (ANTIVERT) 12.5 MG tablet Take 12.5 mg by mouth 3 (three) times daily as needed for dizziness. 06/15/16  Yes [provider]  Multiple Vitamin (MULTIVITAMIN) tablet Take 1 tablet by mouth daily.   Yes [provider]  rivaroxaban (XARELTO) 20 MG TABS tablet Take 1 tablet (20 mg  total) by mouth daily with supper. 04/09/16  Yes AllredJeneen Rinks, MD    Family History Family History  Problem Relation Age of Onset  . Heart disease Mother   . Diabetes Mother   . Kidney cancer Mother   . Asthma Father   . Heart disease Father   . Pancreatic cancer Brother   . Diabetes Sister   . Lung cancer Sister   . Diabetes Brother   . Heart attack Son   . Colon cancer Neg Hx     Social History Social History  Substance Use Topics  . Smoking status: Former Smoker    Packs/day: 2.00    Years: 40.00    Types: Cigarettes    Quit date: 08/05/1995  . Smokeless tobacco: Never Used  . Alcohol use No     Allergies   Prednisone; Septra [sulfamethoxazole-trimethoprim]; Penicillins; and Vioxx [rofecoxib]   Review of Systems Review of Systems  All other systems reviewed and are negative.    Physical Exam Updated Vital Signs BP (!) 148/90 (BP Location:  Left Arm)   Pulse 79   Temp 97.9 F (36.6 C) (Oral)   Resp 14   Ht 5\' 2"  (1.575 m)   Wt 67.4 kg (148 lb 9 oz)   SpO2 97%   BMI 27.17 kg/m   Physical Exam  Constitutional: She is oriented to person, place, and time. She appears well-developed. No distress.  Obese, elderly  HENT:  Head: Normocephalic and atraumatic.  Eyes: Pupils are equal, round, and reactive to light. Conjunctivae and EOM are normal.  Neck: Normal range of motion and phonation normal. Neck supple.  Cardiovascular: Normal rate and regular rhythm.   Pulmonary/Chest: Effort normal and breath sounds normal. No respiratory distress. She has no wheezes. She exhibits no tenderness.  Abdominal: Soft. She exhibits no distension. There is no tenderness. There is no guarding.  Musculoskeletal: Normal range of motion.  Neurological: She is alert and oriented to person, place, and time. She exhibits normal muscle tone.  Skin: Skin is warm and dry.  Psychiatric: She has a normal mood and affect. Her behavior is normal. Judgment and thought content normal.    Nursing note and vitals reviewed.    ED Treatments / Results  Labs (all labs ordered are listed, but only abnormal results are displayed) Labs Reviewed  COMPREHENSIVE METABOLIC PANEL - Abnormal; Notable for the following:       Result Value   Glucose, Bld 188 (*)    AST 43 (*)    Total Bilirubin 1.8 (*)    All other components within normal limits  CBC WITH DIFFERENTIAL/PLATELET - Abnormal; Notable for the following:    Platelets 139 (*)    Lymphs Abs 0.5 (*)    All other components within normal limits  URINALYSIS, ROUTINE W REFLEX MICROSCOPIC - Abnormal; Notable for the following:    Glucose, UA 150 (*)    All other components within normal limits  I-STAT CG4 LACTIC ACID, ED - Abnormal; Notable for the following:    Lactic Acid, Venous 4.34 (*)    All other components within normal limits  I-STAT CG4 LACTIC ACID, ED - Abnormal; Notable for the following:    Lactic Acid, Venous 3.29 (*)    All other components within normal limits  CULTURE, BLOOD (ROUTINE X 2)  CULTURE, BLOOD (ROUTINE X 2)  URINE CULTURE    EKG  EKG Interpretation None       Radiology Ct Chest W Contrast  Result Date: 02/17/2017 CLINICAL DATA:  Non-small cell right lung cancer, stage IV, diagnosed 2013 status post right lobe wedge resection, chemotherapy, and XRT (completed 05/2016). Currently receiving chemotherapy. EXAM: CT CHEST, ABDOMEN, AND PELVIS WITH CONTRAST TECHNIQUE: Multidetector CT imaging of the chest, abdomen and pelvis was performed following the standard protocol during bolus administration of intravenous contrast. CONTRAST:  157mL ISOVUE-300 IOPAMIDOL (ISOVUE-300) INJECTION 61% COMPARISON:  12/01/2016 FINDINGS: CT CHEST FINDINGS Cardiovascular: Heart is normal in size.  No pericardial effusion. Coronary atherosclerosis the LAD and left circumflex. No evidence of thoracic aortic aneurysm. Mild atherosclerotic calcifications of the aortic arch. Mediastinum/Nodes: No suspicious mediastinal,  hilar, or axillary lymphadenopathy. Visualized thyroid is unremarkable. Lungs/Pleura: Status post right upper lobectomy. Near complete resolution of prior bilateral pulmonary nodules. Residual nodular scarring along the posterior aspect of the right middle lobe (series 6/ image 49). Residual nodular scarring in the posterior left upper lobe (series 6/image 72). Residual nodular scarring in the anterior right lower lobe (series 6/ image 90) and in the medial right lung base (series 6/image 105). Stable  radiation changes in the medial right lower lobe (series 6/ image 52). Mild left apical pleural-parenchymal scarring. Mild subpleural scarring along the lateral right lower lobe. No focal consolidation. No pleural effusion or pneumothorax. Musculoskeletal: Mild degenerative changes of the thoracic spine. CT ABDOMEN PELVIS FINDINGS Hepatobiliary: Mildly nodular hepatic contour, suggesting cirrhosis. No focal hepatic lesion is seen. Layering gallstones (series 2/ image 61), without associated inflammatory changes. Pancreas: Within normal limits. Spleen: Spleen is mildly enlarged, measuring 15.1 cm in maximal axial dimension. 10 mm splenic cyst posteriorly (series 2/image 60). Adrenals/Urinary Tract: Adrenal glands are within normal limits. Kidneys are within normal limits.  No hydronephrosis. Bladder is within normal limits. Stomach/Bowel: Stomach is within normal limits. No evidence of bowel obstruction. Normal appendix (series 2/image 82). Mild sigmoid diverticulosis, without evidence of diverticulitis. Vascular/Lymphatic: No evidence of abdominal aortic aneurysm. Atherosclerotic calcifications of the abdominal aorta and branch vessels. Portal vein is patent. Recanalized periumbilical vein (series 2/image 59). Small perigastric varices. No suspicious abdominopelvic lymphadenopathy. Small upper abdominal lymph nodes measuring up to 7 mm short axis, within normal limits. Reproductive: Status post hysterectomy. Bilateral  ovaries are within normal limits. Other: No abdominopelvic ascites. Musculoskeletal: Mild degenerative changes of the lumbar spine. IMPRESSION: Status post right upper lobectomy. Radiation changes in the medial right lower lobe. Prior bilateral pulmonary nodules/ metastases have essentially resolved, with minimal residual nodular scarring. No evidence of metastatic disease in the abdomen/pelvis. Cirrhosis with stigmata of portal hypertension. Portal vein is patent. Additional stable ancillary findings as above. Electronically Signed   By: Julian Hy M.D.   On: 02/17/2017 15:08   Ct Abdomen Pelvis W Contrast  Result Date: 02/17/2017 CLINICAL DATA:  Non-small cell right lung cancer, stage IV, diagnosed 2013 status post right lobe wedge resection, chemotherapy, and XRT (completed 05/2016). Currently receiving chemotherapy. EXAM: CT CHEST, ABDOMEN, AND PELVIS WITH CONTRAST TECHNIQUE: Multidetector CT imaging of the chest, abdomen and pelvis was performed following the standard protocol during bolus administration of intravenous contrast. CONTRAST:  173mL ISOVUE-300 IOPAMIDOL (ISOVUE-300) INJECTION 61% COMPARISON:  12/01/2016 FINDINGS: CT CHEST FINDINGS Cardiovascular: Heart is normal in size.  No pericardial effusion. Coronary atherosclerosis the LAD and left circumflex. No evidence of thoracic aortic aneurysm. Mild atherosclerotic calcifications of the aortic arch. Mediastinum/Nodes: No suspicious mediastinal, hilar, or axillary lymphadenopathy. Visualized thyroid is unremarkable. Lungs/Pleura: Status post right upper lobectomy. Near complete resolution of prior bilateral pulmonary nodules. Residual nodular scarring along the posterior aspect of the right middle lobe (series 6/ image 49). Residual nodular scarring in the posterior left upper lobe (series 6/image 72). Residual nodular scarring in the anterior right lower lobe (series 6/ image 90) and in the medial right lung base (series 6/image 105). Stable  radiation changes in the medial right lower lobe (series 6/ image 52). Mild left apical pleural-parenchymal scarring. Mild subpleural scarring along the lateral right lower lobe. No focal consolidation. No pleural effusion or pneumothorax. Musculoskeletal: Mild degenerative changes of the thoracic spine. CT ABDOMEN PELVIS FINDINGS Hepatobiliary: Mildly nodular hepatic contour, suggesting cirrhosis. No focal hepatic lesion is seen. Layering gallstones (series 2/ image 61), without associated inflammatory changes. Pancreas: Within normal limits. Spleen: Spleen is mildly enlarged, measuring 15.1 cm in maximal axial dimension. 10 mm splenic cyst posteriorly (series 2/image 60). Adrenals/Urinary Tract: Adrenal glands are within normal limits. Kidneys are within normal limits.  No hydronephrosis. Bladder is within normal limits. Stomach/Bowel: Stomach is within normal limits. No evidence of bowel obstruction. Normal appendix (series 2/image 82). Mild sigmoid diverticulosis, without evidence of  diverticulitis. Vascular/Lymphatic: No evidence of abdominal aortic aneurysm. Atherosclerotic calcifications of the abdominal aorta and branch vessels. Portal vein is patent. Recanalized periumbilical vein (series 2/image 59). Small perigastric varices. No suspicious abdominopelvic lymphadenopathy. Small upper abdominal lymph nodes measuring up to 7 mm short axis, within normal limits. Reproductive: Status post hysterectomy. Bilateral ovaries are within normal limits. Other: No abdominopelvic ascites. Musculoskeletal: Mild degenerative changes of the lumbar spine. IMPRESSION: Status post right upper lobectomy. Radiation changes in the medial right lower lobe. Prior bilateral pulmonary nodules/ metastases have essentially resolved, with minimal residual nodular scarring. No evidence of metastatic disease in the abdomen/pelvis. Cirrhosis with stigmata of portal hypertension. Portal vein is patent. Additional stable ancillary findings  as above. Electronically Signed   By: Julian Hy M.D.   On: 02/17/2017 15:08   Dg Chest Port 1 View  Result Date: 02/18/2017 CLINICAL DATA:  Nausea and vomiting EXAM: PORTABLE CHEST 1 VIEW COMPARISON:  06/14/2016 chest radiograph. FINDINGS: Stable cardiomediastinal silhouette with normal heart size. No pneumothorax. Surgical clips overlie the right axilla. Surgical sutures are seen in the medial right hemithorax with surgical clips overlying the right hilum, unchanged. Loop recorder overlies the lower heart. Stable chronic blunting of the right costophrenic angle compatible with pleural-parenchymal scarring. No pulmonary edema. Mild scarring versus atelectasis at the left costophrenic angle. No acute consolidative airspace disease. IMPRESSION: 1. Stable postsurgical changes in the right hemithorax. 2. Mild scarring versus atelectasis at the left costophrenic angle. Otherwise no active disease in the chest. Electronically Signed   By: Ilona Sorrel M.D.   On: 02/18/2017 18:07    Procedures Procedures (including critical care time)  Medications Ordered in ED Medications  vancomycin (VANCOCIN) 500 mg in sodium chloride 0.9 % 100 mL IVPB (not administered)  levofloxacin (LEVAQUIN) IVPB 750 mg (not administered)  aztreonam (AZACTAM) 2 g in dextrose 5 % 50 mL IVPB (not administered)  feeding supplement (ENSURE ENLIVE) (ENSURE ENLIVE) liquid 237 mL (237 mLs Oral Given 02/18/17 1851)  sodium chloride 0.9 % bolus 1,000 mL (0 mLs Intravenous Stopped 02/18/17 2148)    And  sodium chloride 0.9 % bolus 1,000 mL (1,000 mLs Intravenous New Bag/Given 02/18/17 1834)    And  sodium chloride 0.9 % bolus 250 mL (0 mLs Intravenous Stopped 02/18/17 1857)  levofloxacin (LEVAQUIN) IVPB 750 mg (0 mg Intravenous Stopped 02/18/17 2027)  aztreonam (AZACTAM) 2 g in dextrose 5 % 50 mL IVPB (0 g Intravenous Stopped 02/18/17 1939)  vancomycin (VANCOCIN) IVPB 1000 mg/200 mL premix (0 mg Intravenous Stopped 02/18/17 2148)      Initial Impression / Assessment and Plan / ED Course  I have reviewed the triage vital signs and the nursing notes.  Pertinent labs & imaging results that were available during my care of the patient were reviewed by me and considered in my medical decision making (see chart for details).  Clinical Course as of Feb 18 2200  Wed Feb 18, 2017  1700 After long discussion with the patient, she wanted only minimal testing done.  Therefore a i-STAT lactate was ordered.  [EW]    Clinical Course User Index [EW] Daleen Bo, MD     Patient Vitals for the past 24 hrs:  BP Temp Temp src Pulse Resp SpO2 Height Weight  02/18/17 1940 (!) 148/90 97.9 F (36.6 C) Oral 79 14 97 % - -  02/18/17 1730 - - - 76 (!) 21 - - -  02/18/17 1700 - - - 82 19 - - -  02/18/17  1547 - (!) 94.8 F (34.9 C) Rectal - - - - -  02/18/17 1530 127/65 - - 65 15 - - -  02/18/17 1529 - - - (!) 56 15 - - -  02/18/17 1522 - - - - - - 5\' 2"  (1.575 m) 67.4 kg (148 lb 9 oz)    5:25 PM Reevaluation with update and discussion. After initial assessment and treatment, an updated evaluation reveals no change in clinical status.  After long discussion with the patient, she agreed to proceed with evaluation and treatment for suspected sepsis associated with hypothermia and elevated lactate.  Initial blood pressure was normal.  She will be given high-volume saline boluses and empiric antibiotics for infection, source unknown.  She will require admission. Rahkeem Senft L   22: 00-lactate somewhat better, still elevated at 3.3.  Oral temperature improved.  Blood pressure is reassuring.  10:05 PM-Consult complete with hospitalist. Patient case explained and discussed.  He agrees to admit patient for further evaluation and treatment. Call ended at [place] Performed by: Richarda Blade Total critical care time: 65 minutes Critical care time was exclusive of separately billable procedures and treating other  patients. Critical care was necessary to treat or prevent imminent or life-threatening deterioration. Critical care was time spent personally by me on the following activities: development of treatment plan with patient and/or surrogate as well as nursing, discussions with consultants, evaluation of patient's response to treatment, examination of patient, obtaining history from patient or surrogate, ordering and performing treatments and interventions, ordering and review of laboratory studies, ordering and review of radiographic studies, pulse oximetry and re-evaluation of patient's condition.   Final Clinical Impressions(s) / ED Diagnoses   Final diagnoses:  Sepsis, due to unspecified organism ([place])  Nausea and vomiting, intractability of vomiting not specified, unspecified vomiting type  Low back pain without sciatica, unspecified back pain laterality, unspecified chronicity  Malignant neoplasm of lung, unspecified laterality, unspecified part of lung ([facility])    Nonspecific back pain, with hypothermia, and vomiting during chemotherapy.  She was receiving a new agent today.  Neck pain was essentially transient, so doubt spinal myelopathy.  Screening evaluation does not indicate a source of infection.  She has been treated with high-volume saline, boluses, and empiric antibiotics for infection, sepsis, source unknown.  Lactate has trended better.  Hypothermia improving.   Nursing Notes Reviewed/ Care Coordinated Applicable Imaging Reviewed Interpretation of Laboratory Data incorporated into ED treatment   Plan -admit for observation.     New Prescriptions New Prescriptions   No medications on file     Daleen Bo, MD 02/18/17 2234

## 2017-02-18 NOTE — ED Notes (Signed)
Notified EDP,Wentz,MD., pt. I-stat CG4 Lactic acid results 3.29 and RN,Kaitlyn,RN.

## 2017-02-18 NOTE — ED Notes (Signed)
20 minute timer started

## 2017-02-18 NOTE — ED Notes (Signed)
Pt ambulatory to bathroom, pt nauseas after walking and had a small amount of emesis

## 2017-02-18 NOTE — ED Notes (Signed)
X-ray at bedside

## 2017-02-19 ENCOUNTER — Encounter (HOSPITAL_COMMUNITY): Payer: Self-pay | Admitting: Internal Medicine

## 2017-02-19 DIAGNOSIS — R531 Weakness: Secondary | ICD-10-CM

## 2017-02-19 DIAGNOSIS — I951 Orthostatic hypotension: Secondary | ICD-10-CM

## 2017-02-19 DIAGNOSIS — R112 Nausea with vomiting, unspecified: Secondary | ICD-10-CM

## 2017-02-19 DIAGNOSIS — N179 Acute kidney failure, unspecified: Secondary | ICD-10-CM | POA: Diagnosis present

## 2017-02-19 DIAGNOSIS — K7460 Unspecified cirrhosis of liver: Secondary | ICD-10-CM | POA: Diagnosis present

## 2017-02-19 DIAGNOSIS — R6510 Systemic inflammatory response syndrome (SIRS) of non-infectious origin without acute organ dysfunction: Secondary | ICD-10-CM | POA: Diagnosis present

## 2017-02-19 DIAGNOSIS — I480 Paroxysmal atrial fibrillation: Secondary | ICD-10-CM | POA: Diagnosis present

## 2017-02-19 DIAGNOSIS — K7469 Other cirrhosis of liver: Secondary | ICD-10-CM | POA: Diagnosis not present

## 2017-02-19 DIAGNOSIS — M545 Low back pain: Secondary | ICD-10-CM | POA: Diagnosis not present

## 2017-02-19 DIAGNOSIS — E860 Dehydration: Secondary | ICD-10-CM | POA: Diagnosis present

## 2017-02-19 DIAGNOSIS — C3491 Malignant neoplasm of unspecified part of right bronchus or lung: Secondary | ICD-10-CM | POA: Diagnosis present

## 2017-02-19 DIAGNOSIS — D6481 Anemia due to antineoplastic chemotherapy: Secondary | ICD-10-CM | POA: Diagnosis present

## 2017-02-19 LAB — HEPATIC FUNCTION PANEL
ALK PHOS: 85 U/L (ref 38–126)
ALT: 26 U/L (ref 14–54)
AST: 31 U/L (ref 15–41)
Albumin: 3.5 g/dL (ref 3.5–5.0)
BILIRUBIN INDIRECT: 1.2 mg/dL — AB (ref 0.3–0.9)
BILIRUBIN TOTAL: 1.4 mg/dL — AB (ref 0.3–1.2)
Bilirubin, Direct: 0.2 mg/dL (ref 0.1–0.5)
TOTAL PROTEIN: 6.3 g/dL — AB (ref 6.5–8.1)

## 2017-02-19 LAB — CBC
HEMATOCRIT: 32.5 % — AB (ref 36.0–46.0)
HEMOGLOBIN: 11.7 g/dL — AB (ref 12.0–15.0)
MCH: 34.3 pg — AB (ref 26.0–34.0)
MCHC: 36 g/dL (ref 30.0–36.0)
MCV: 95.3 fL (ref 78.0–100.0)
Platelets: 123 10*3/uL — ABNORMAL LOW (ref 150–400)
RBC: 3.41 MIL/uL — AB (ref 3.87–5.11)
RDW: 13.3 % (ref 11.5–15.5)
WBC: 5.3 10*3/uL (ref 4.0–10.5)

## 2017-02-19 LAB — LACTIC ACID, PLASMA
Lactic Acid, Venous: 1.9 mmol/L (ref 0.5–1.9)
Lactic Acid, Venous: 0.9 mmol/L (ref 0.5–1.9)

## 2017-02-19 LAB — BASIC METABOLIC PANEL
ANION GAP: 5 (ref 5–15)
BUN: 8 mg/dL (ref 6–20)
CALCIUM: 8.7 mg/dL — AB (ref 8.9–10.3)
CHLORIDE: 111 mmol/L (ref 101–111)
CO2: 25 mmol/L (ref 22–32)
Creatinine, Ser: 0.44 mg/dL (ref 0.44–1.00)
GFR calc non Af Amer: >60 mL/min (ref >60)
GLUCOSE: 83 mg/dL (ref 65–99)
POTASSIUM: 4.1 mmol/L (ref 3.5–5.1)
Sodium: 141 mmol/L (ref 135–145)

## 2017-02-19 LAB — PROCALCITONIN: Procalcitonin: <0.1 ng/mL

## 2017-02-19 MED ORDER — DEXTROSE 5 % IV SOLN
1.0000 g | Freq: Two times a day (BID) | INTRAVENOUS | Status: DC
  Filled 2017-02-19: qty 1

## 2017-02-19 MED ORDER — FLUTICASONE PROPIONATE 50 MCG/ACT NA SUSP
1.0000 | Freq: Every day | NASAL | Status: DC
  Filled 2017-02-19: qty 16

## 2017-02-19 MED ORDER — ACETAMINOPHEN 650 MG RE SUPP: 650.0000 mg | Freq: Four times a day (QID) | RECTAL | Status: DC | PRN

## 2017-02-19 MED ORDER — ONDANSETRON HCL 4 MG/2ML IJ SOLN: 4.0000 mg | Freq: Four times a day (QID) | INTRAMUSCULAR | Status: DC | PRN

## 2017-02-19 MED ORDER — IPRATROPIUM-ALBUTEROL 0.5-2.5 (3) MG/3ML IN SOLN: 3.0000 mL | Freq: Four times a day (QID) | RESPIRATORY_TRACT | Status: DC | PRN

## 2017-02-19 MED ORDER — RIVAROXABAN 20 MG PO TABS
20.0000 mg | ORAL_TABLET | Freq: Every day | ORAL | Status: DC
  Administered 2017-02-19: 20 mg via ORAL
  Filled 2017-02-19: qty 1

## 2017-02-19 MED ORDER — ACETAMINOPHEN 325 MG PO TABS
650.0000 mg | ORAL_TABLET | Freq: Four times a day (QID) | ORAL | Status: DC | PRN
  Administered 2017-02-19: 650 mg via ORAL
  Filled 2017-02-19: qty 2

## 2017-02-19 MED ORDER — ONDANSETRON HCL 4 MG PO TABS: 4.0000 mg | ORAL_TABLET | Freq: Four times a day (QID) | ORAL | Status: DC | PRN

## 2017-02-19 MED ORDER — SODIUM CHLORIDE 0.9 % IV SOLN
INTRAVENOUS | Status: AC
  Administered 2017-02-19: 03:00:00 via INTRAVENOUS

## 2017-02-19 MED ORDER — DILTIAZEM HCL ER COATED BEADS 240 MG PO CP24
240.0000 mg | ORAL_CAPSULE | Freq: Every day | ORAL | Status: DC
  Administered 2017-02-19 – 2017-02-20 (×2): 240 mg via ORAL
  Filled 2017-02-19 (×2): qty 1

## 2017-02-19 MED ORDER — LORATADINE 10 MG PO TABS
10.0000 mg | ORAL_TABLET | Freq: Every day | ORAL | Status: DC
  Administered 2017-02-19 – 2017-02-20 (×2): 10 mg via ORAL
  Filled 2017-02-19 (×2): qty 1

## 2017-02-19 NOTE — Progress Notes (Signed)
[facility] PROGRESS NOTE    Progress Note  [name]  VQQ:[id number] DOB: 1946-06-18 DOA: 02/18/2017 PCP: Nancy Fairy, PA-C     Brief Narrative:   Nancy Hodges is an 71 y.o. female with past medical history of metastatic non-small cell cancer, paroxysmal atrial fibrillation and tachybradycardia syndrome status post pacemaker was referred to the ED after receiving Dilaudid during chemotherapy as she was complaining of back pain. In the ED she was found to be hypothermic and was elevated at 7.  Assessment/Plan:   Orthostatic hypotension: Likely due to dehydration, confusion of her chemotherapy and IV Dilaudid, she gives a good history about feeling lightheaded and weak as soon as she is due to which was made worse by IV Dilaudid which can further drop her blood pressure. Hypothermia has resolved, she only had a single measurement., Discontinue empiric IV antibiotics, blood cultures are pending. Lactic acidosis has resolved. Nausea vomiting has not resolved. Recheck orthostatics.  Acute kidney injury: Likely prerenal versus sepsis, has improved with IV fluid hydration.  Non-small cell carcinoma of right lung, stage 4 ([place]) Oncologist an outpatient.  Hepatic cirrhosis (HCC)  Anemia due to chemotherapy Mild drop in hemoglobin probably hemodilution so he was hemoconcentrated on admission.  Paroxysmal atrial fibrillation: Rate controlled continuous Xarelto on Cardizem. Chads 2 vasc score >  2  DVT prophylaxis: Xarelto Family Communication:none Disposition Plan/Barrier to D/C: Hopefully in the morning. Code Status:     Code Status Orders        Start     Ordered   02/19/17 0042  Full code  Continuous     02/19/17 0042    Code Status History    Date Active Date Inactive Code Status Order ID Comments User Context   07/29/2014  3:28 PM 08/01/2014  8:27 PM Full Code [id number]  Hosie Poisson, MD Inpatient   01/18/2013 12:40 AM 01/21/2013  3:56 PM Full  Code [id number]  Brand Males, MD Inpatient   10/27/2012  4:19 PM 11/05/2012  3:36 PM Full Code [id number]  Coolidge Breeze, PA-C Inpatient   09/14/2012  8:18 PM 09/15/2012  1:41 PM Full Code [id number]  [name], MD Inpatient    Advance Directive Documentation     Most Recent Value  Type of Advance Directive  Healthcare Power of Attorney  Pre-existing out of facility DNR order (yellow form or pink MOST form)  -  "MOST" Form in Place?  -        IV Access:    Peripheral IV   Procedures and diagnostic studies:   Ct Chest W Contrast  Result Date: 02/17/2017 CLINICAL DATA:  Non-small cell right lung cancer, stage IV, diagnosed 2013 status post right lobe wedge resection, chemotherapy, and XRT (completed 05/2016). Currently receiving chemotherapy. EXAM: CT CHEST, ABDOMEN, AND PELVIS WITH CONTRAST TECHNIQUE: Multidetector CT imaging of the chest, abdomen and pelvis was performed following the standard protocol during bolus administration of intravenous contrast. CONTRAST:  120mL ISOVUE-300 IOPAMIDOL (ISOVUE-300) INJECTION 61% COMPARISON:  12/01/2016 FINDINGS: CT CHEST FINDINGS Cardiovascular: Heart is normal in size.  No pericardial effusion. Coronary atherosclerosis the LAD and left circumflex. No evidence of thoracic aortic aneurysm. Mild atherosclerotic calcifications of the aortic arch. Mediastinum/Nodes: No suspicious mediastinal, hilar, or axillary lymphadenopathy. Visualized thyroid is unremarkable. Lungs/Pleura: Status post right upper lobectomy. Near complete resolution of prior bilateral pulmonary nodules. Residual nodular scarring along the posterior aspect of the right middle lobe (series 6/ image 49). Residual nodular scarring in the posterior left upper  lobe (series 6/image 72). Residual nodular scarring in the anterior right lower lobe (series 6/ image 90) and in the medial right lung base (series 6/image 105). Stable radiation changes in the medial right lower lobe (series 6/ image  52). Mild left apical pleural-parenchymal scarring. Mild subpleural scarring along the lateral right lower lobe. No focal consolidation. No pleural effusion or pneumothorax. Musculoskeletal: Mild degenerative changes of the thoracic spine. CT ABDOMEN PELVIS FINDINGS Hepatobiliary: Mildly nodular hepatic contour, suggesting cirrhosis. No focal hepatic lesion is seen. Layering gallstones (series 2/ image 61), without associated inflammatory changes. Pancreas: Within normal limits. Spleen: Spleen is mildly enlarged, measuring 15.1 cm in maximal axial dimension. 10 mm splenic cyst posteriorly (series 2/image 60). Adrenals/Urinary Tract: Adrenal glands are within normal limits. Kidneys are within normal limits.  No hydronephrosis. Bladder is within normal limits. Stomach/Bowel: Stomach is within normal limits. No evidence of bowel obstruction. Normal appendix (series 2/image 82). Mild sigmoid diverticulosis, without evidence of diverticulitis. Vascular/Lymphatic: No evidence of abdominal aortic aneurysm. Atherosclerotic calcifications of the abdominal aorta and branch vessels. Portal vein is patent. Recanalized periumbilical vein (series 2/image 59). Small perigastric varices. No suspicious abdominopelvic lymphadenopathy. Small upper abdominal lymph nodes measuring up to 7 mm short axis, within normal limits. Reproductive: Status post hysterectomy. Bilateral ovaries are within normal limits. Other: No abdominopelvic ascites. Musculoskeletal: Mild degenerative changes of the lumbar spine. IMPRESSION: Status post right upper lobectomy. Radiation changes in the medial right lower lobe. Prior bilateral pulmonary nodules/ metastases have essentially resolved, with minimal residual nodular scarring. No evidence of metastatic disease in the abdomen/pelvis. Cirrhosis with stigmata of portal hypertension. Portal vein is patent. Additional stable ancillary findings as above. Electronically Signed   By: Julian Hy M.D.   On:  02/17/2017 15:08   Ct Abdomen Pelvis W Contrast  Result Date: 02/17/2017 CLINICAL DATA:  Non-small cell right lung cancer, stage IV, diagnosed 2013 status post right lobe wedge resection, chemotherapy, and XRT (completed 05/2016). Currently receiving chemotherapy. EXAM: CT CHEST, ABDOMEN, AND PELVIS WITH CONTRAST TECHNIQUE: Multidetector CT imaging of the chest, abdomen and pelvis was performed following the standard protocol during bolus administration of intravenous contrast. CONTRAST:  188mL ISOVUE-300 IOPAMIDOL (ISOVUE-300) INJECTION 61% COMPARISON:  12/01/2016 FINDINGS: CT CHEST FINDINGS Cardiovascular: Heart is normal in size.  No pericardial effusion. Coronary atherosclerosis the LAD and left circumflex. No evidence of thoracic aortic aneurysm. Mild atherosclerotic calcifications of the aortic arch. Mediastinum/Nodes: No suspicious mediastinal, hilar, or axillary lymphadenopathy. Visualized thyroid is unremarkable. Lungs/Pleura: Status post right upper lobectomy. Near complete resolution of prior bilateral pulmonary nodules. Residual nodular scarring along the posterior aspect of the right middle lobe (series 6/ image 49). Residual nodular scarring in the posterior left upper lobe (series 6/image 72). Residual nodular scarring in the anterior right lower lobe (series 6/ image 90) and in the medial right lung base (series 6/image 105). Stable radiation changes in the medial right lower lobe (series 6/ image 52). Mild left apical pleural-parenchymal scarring. Mild subpleural scarring along the lateral right lower lobe. No focal consolidation. No pleural effusion or pneumothorax. Musculoskeletal: Mild degenerative changes of the thoracic spine. CT ABDOMEN PELVIS FINDINGS Hepatobiliary: Mildly nodular hepatic contour, suggesting cirrhosis. No focal hepatic lesion is seen. Layering gallstones (series 2/ image 61), without associated inflammatory changes. Pancreas: Within normal limits. Spleen: Spleen is mildly  enlarged, measuring 15.1 cm in maximal axial dimension. 10 mm splenic cyst posteriorly (series 2/image 60). Adrenals/Urinary Tract: Adrenal glands are within normal limits. Kidneys are within normal limits.  No hydronephrosis. Bladder is within normal limits. Stomach/Bowel: Stomach is within normal limits. No evidence of bowel obstruction. Normal appendix (series 2/image 82). Mild sigmoid diverticulosis, without evidence of diverticulitis. Vascular/Lymphatic: No evidence of abdominal aortic aneurysm. Atherosclerotic calcifications of the abdominal aorta and branch vessels. Portal vein is patent. Recanalized periumbilical vein (series 2/image 59). Small perigastric varices. No suspicious abdominopelvic lymphadenopathy. Small upper abdominal lymph nodes measuring up to 7 mm short axis, within normal limits. Reproductive: Status post hysterectomy. Bilateral ovaries are within normal limits. Other: No abdominopelvic ascites. Musculoskeletal: Mild degenerative changes of the lumbar spine. IMPRESSION: Status post right upper lobectomy. Radiation changes in the medial right lower lobe. Prior bilateral pulmonary nodules/ metastases have essentially resolved, with minimal residual nodular scarring. No evidence of metastatic disease in the abdomen/pelvis. Cirrhosis with stigmata of portal hypertension. Portal vein is patent. Additional stable ancillary findings as above. Electronically Signed   By: Julian Hy M.D.   On: 02/17/2017 15:08   Dg Chest Port 1 View  Result Date: 02/18/2017 CLINICAL DATA:  Nausea and vomiting EXAM: PORTABLE CHEST 1 VIEW COMPARISON:  06/14/2016 chest radiograph. FINDINGS: Stable cardiomediastinal silhouette with normal heart size. No pneumothorax. Surgical clips overlie the right axilla. Surgical sutures are seen in the medial right hemithorax with surgical clips overlying the right hilum, unchanged. Loop recorder overlies the lower heart. Stable chronic blunting of the right costophrenic  angle compatible with pleural-parenchymal scarring. No pulmonary edema. Mild scarring versus atelectasis at the left costophrenic angle. No acute consolidative airspace disease. IMPRESSION: 1. Stable postsurgical changes in the right hemithorax. 2. Mild scarring versus atelectasis at the left costophrenic angle. Otherwise no active disease in the chest. Electronically Signed   By: Ilona Sorrel M.D.   On: 02/18/2017 18:07     Medical Consultants:    None.  Anti-Infectives:   IV we have discontinued vancomycin and cefepime.  Subjective:    Nancy Hodges she relates she feels much better, she does not relate orthostatic symptoms upon standing.  Objective:    Vitals:   02/18/17 1940 02/18/17 2204 02/18/17 2325 02/19/17 0524  BP: (!) 148/90 (!) 131/51 134/62 (!) 133/52  Pulse: 79 77 80 85  Resp: 14 19 20 18   Temp: 97.9 F (36.6 C)  98 F (36.7 C) 98.2 F (36.8 C)  TempSrc: Oral  Oral Oral  SpO2: 97% 99% 98% 95%  Weight:   68.3 kg (150 lb 9.2 oz)   Height:   5\' 2"  (1.575 m)     Intake/Output Summary (Last 24 hours) at 02/19/17 0734 Last data filed at 02/19/17 0400  Gross per 24 hour  Intake             2705 ml  Output              350 ml  Net             2355 ml   Filed Weights   02/18/17 1522 02/18/17 2325  Weight: 67.4 kg (148 lb 9 oz) 68.3 kg (150 lb 9.2 oz)    Exam: General exam: In no acute distress. Respiratory system: Good air movement and clear to auscultation Cardiovascular system: Regular rate and rhythm with positive S1-S2 no JVD Gastrointestinal system: Diminished soft, nontender nondistended. Central nervous system:  Extremities: No pedal edema. Skin: No rashes, lesions or ulcers Psychiatry: Judgement and insight appear normal. Mood & affect appropriate.    Data Reviewed:    Labs: Basic Metabolic Panel:  Recent Labs Lab 02/18/17  0867 02/18/17 1749 02/19/17 0458  NA 140 138 141  K 4.1 4.0 4.1  CL  --  102 111  CO2 26 26 25   GLUCOSE  142* 188* 83  BUN 8.7 10 8   CREATININE 0.7 0.60 0.44  CALCIUM 9.5 9.3 8.7*   GFR Estimated Creatinine Clearance: 56.8 mL/min (by C-G formula based on SCr of 0.44 mg/dL). Liver Function Tests:  Recent Labs Lab 02/18/17 0946 02/18/17 1749 02/19/17 0458  AST 35* 43* 31  ALT 30 33 26  ALKPHOS 110 109 85  BILITOT 1.56* 1.8* 1.4*  PROT 6.8 7.7 6.3*  ALBUMIN 3.7 4.3 3.5   No results for input(s): LIPASE, AMYLASE in the last 168 hours. No results for input(s): AMMONIA in the last 168 hours. Coagulation profile No results for input(s): INR, PROTIME in the last 168 hours.  CBC:  Recent Labs Lab 02/18/17 0946 02/18/17 1749 02/19/17 0458  WBC 4.2 6.5 5.3  NEUTROABS 2.6 5.8  --   HGB 13.4 13.8 11.7*  HCT 38.2 39.2 32.5*  MCV 97.3 95.8 95.3  PLT 147 139* 123*   Cardiac Enzymes: No results for input(s): CKTOTAL, CKMB, CKMBINDEX, TROPONINI in the last 168 hours. BNP (last 3 results) No results for input(s): PROBNP in the last 8760 hours. CBG: No results for input(s): GLUCAP in the last 168 hours. D-Dimer: No results for input(s): DDIMER in the last 72 hours. Hgb A1c: No results for input(s): HGBA1C in the last 72 hours. Lipid Profile: No results for input(s): CHOL, HDL, LDLCALC, TRIG, CHOLHDL, LDLDIRECT in the last 72 hours. Thyroid function studies: No results for input(s): TSH, T4TOTAL, T3FREE, THYROIDAB in the last 72 hours.  Invalid input(s): FREET3 Anemia work up: No results for input(s): VITAMINB12, FOLATE, FERRITIN, TIBC, IRON, RETICCTPCT in the last 72 hours. Sepsis Labs:  Recent Labs Lab 02/18/17 0946 02/18/17 1710 02/18/17 1749 02/18/17 2039 02/19/17 0052 02/19/17 0458  PROCALCITON  --   --   --   --  <0.10  --   WBC 4.2  --  6.5  --   --  5.3  LATICACIDVEN  --  4.34*  --  3.29* 1.9 0.9   Microbiology No results found for this or any previous visit (from the past 240 hour(s)).   Medications:   . diltiazem  240 mg Oral Daily  . fluticasone  1  spray Each Nare Daily  . loratadine  10 mg Oral Daily  . rivaroxaban  20 mg Oral Q supper   Continuous Infusions: . sodium chloride 100 mL/hr at 02/19/17 0257      LOS: 0 days   Nancy Hodges  [facility] Pager [phone]  *Please refer to [email or web address], password [email or web address] to get updated schedule on who will round on this patient, as hospitalists switch teams weekly. If 7PM-7AM, please contact night-coverage at [email or web address], password [email or web address] for any overnight needs.  02/19/2017, 7:34 AM

## 2017-02-19 NOTE — Care Management Obs Status (Signed)
[facility] NOTIFICATION   Patient Details  Name: Nancy Hodges MRN: [id number] Date of Birth: 1945/08/19   Medicare Observation Status Notification Given:  Yes    MahabirJuliann Pulse, RN 02/19/2017, 11:01 AM

## 2017-02-19 NOTE — H&P (Signed)
History and Physical    Nancy Hodges IRJ:[id number] DOB: August 03, 1946 DOA: 02/18/2017  PCP: [name], PA-C  Patient coming from: Home.  Chief Complaint: Weakness and nausea vomiting.  HPI: Nancy Hodges is a 71 y.o. female with history of metastatic non-small cell lung cancer, paroxysmal atrial fibrillation and tachybradycardia syndrome status post pacemaker placement was referred to the ER after patient became after receiving Dilaudid during chemotherapy. Patient started complaining of low back pain during chemotherapy and was given Dilaudid. Following which patient became nauseated with multiple episodes of vomiting. Patient felt weak and was referred to the ER.   ED Course: In the ER patient was found to be hypothermic and lactate was elevated. Blood cultures were obtained and patient was started on empiric antibiotics. Patient was placed on warming blankets and sewn temperature is improved. Patient was given fluid boluses. Patient is being admitted for further observation.   Review of Systems: As per HPI, rest all negative.   Past Medical History:  Diagnosis Date  . Allergic rhinitis   . Anxiety   . Arthritis   . Asthma   . Atrial flutter ([place])   . Cholelithiases 09/26/2015  . Diverticulosis   . Encounter for antineoplastic immunotherapy 12/09/2016  . GERD (gastroesophageal reflux disease)   . H/O hiatal hernia   . History of kidney stones   . Internal hemorrhoid   . Lung cancer ([place])    a. Stage IB non-small cell carcinoma, s/p R VATS, wedge resection, RU lobectomy 10/2012.  [name] Other specified diabetes mellitus with hyperglycemia ([place])    hx of chemo induced high blood sugar  . Paroxysmal atrial fibrillation (HCC)    a. anticoagulated with Xarelto  . Pneumonia 06/2016   [facility]  . Pulmonary nodule   . Tachycardia-bradycardia syndrome ([place])    a. 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    Past Surgical History:  Procedure Laterality Date  .  CATARACT EXTRACTION W/PHACO Right 08/14/2016   Procedure: CATARACT EXTRACTION PHACO AND INTRAOCULAR LENS PLACEMENT RIGHT EYE CDE 10.53;  Surgeon: Tonny Branch, MD;  Location: [facility];  Service: Ophthalmology;  Laterality: Right;  right  . CATARACT EXTRACTION W/PHACO Left 09/25/2016   Procedure: CATARACT EXTRACTION PHACO AND INTRAOCULAR LENS PLACEMENT (IOC);  Surgeon: Tonny Branch, MD;  Location: [facility];  Service: Ophthalmology;  Laterality: Left;  left CDE 8.81  . COLONOSCOPY     [facility]: cattered sigmoid diverticula, internal hemorrhoids, sessile polyp in ascending and mid transverse colon (tubular adenoma).   . colonoscopy with polypectomy  2015  . ESOPHAGOGASTRODUODENOSCOPY     [facility]: bile reflux in stomach, mild gastritis, hiatal hernia  . ESOPHAGOGASTRODUODENOSCOPY N/A 01/31/2016   Procedure: ESOPHAGOGASTRODUODENOSCOPY (EGD);  Surgeon: [name], MD;  Location: [facility];  Service: Endoscopy;  Laterality: N/A;  1200  . LOBECTOMY Right 10/27/2012   Procedure: LOBECTOMY;  Surgeon: [name], MD;  Location: [facility];  Service: Thoracic;  Laterality: Right;   RIGHT UPPER LOBECTOMY & Node Dissection  . LYMPH NODE BIOPSY Right 05/01/2014   Procedure: LYMPH NODE BIOPSY, Right Axillary;  Surgeon: [name], MD;  Location: [facility];  Service: Thoracic;  Laterality: Right;  . PARTIAL HYSTERECTOMY    . PERMANENT PACEMAKER INSERTION N/A 09/14/2012   Nanostim (SJM) leadless pacemaker (LEADLESS II STUDY PATIENT) implanted by Dr [name]  . VIDEO ASSISTED THORACOSCOPY (VATS)/WEDGE RESECTION Right 10/27/2012   Procedure: VIDEO ASSISTED THORACOSCOPY (VATS),RGHT UPPER LOBE LUNG WEDGE RESECTION;  Surgeon: [name], MD;  Location: [facility];  Service: Thoracic;  Laterality: Right;     reports that she quit smoking about 21 years ago. Her smoking use included Cigarettes. She has a 80.00 pack-year smoking history. She has never used smokeless tobacco. She reports that she does not drink  alcohol or use drugs.  Allergies  Allergen Reactions  . Prednisone Shortness Of Breath, Palpitations and Other (See Comments)    REACTION: double vision History is not consistent with any allergy.  Sarina Ill [Sulfamethoxazole-Trimethoprim] Shortness Of Breath  . Penicillins Swelling and Rash    Has patient had a PCN reaction causing immediate rash, facial/tongue/throat swelling, SOB or lightheadedness with hypotension: Yes Has patient had a PCN reaction causing severe rash involving mucus membranes or skin necrosis: No Has patient had a PCN reaction that required hospitalization No Has patient had a PCN reaction occurring within the last 10 years: No If all of the above answers are "NO", then may proceed with Cephalosporin use.   . Vioxx [Rofecoxib] Other (See Comments)    REACTION:  Stomach cramps    Family History  Problem Relation Age of Onset  . Heart disease Mother   . Diabetes Mother   . Kidney cancer Mother   . Asthma Father   . Heart disease Father   . Pancreatic cancer Brother   . Diabetes Sister   . Lung cancer Sister   . Diabetes Brother   . Heart attack Son   . Colon cancer Neg Hx     Prior to Admission medications   Medication Sig Start Date End Date Taking? Authorizing Provider  acetaminophen (TYLENOL) 500 MG tablet Take 500 mg by mouth every 6 (six) hours as needed for moderate pain or fever.    Yes [provider]  albuterol (PROVENTIL HFA;VENTOLIN HFA) 108 (90 BASE) MCG/ACT inhaler Inhale 1-2 puffs into the lungs every 6 (six) hours as needed for wheezing or shortness of breath.   Yes [provider]  diltiazem (CARDIZEM CD) 240 MG 24 hr capsule Take 1 capsule (240 mg total) by mouth daily. 04/09/16  Yes Allred, Jeneen Rinks, MD  fexofenadine (ALLEGRA) 180 MG tablet Take 180 mg by mouth daily.   Yes [provider]  fluticasone (FLONASE) 50 MCG/ACT nasal spray Place 1 spray into both nostrils daily. 02/06/17  Yes [provider]    hydroxypropyl methylcellulose / hypromellose (ISOPTO TEARS / GONIOVISC) 2.5 % ophthalmic solution Place 1-2 drops into the left eye as needed for dry eyes.   Yes [provider]  ipratropium-albuterol (DUONEB) 0.5-2.5 (3) MG/3ML SOLN Take 3 mLs by nebulization every 6 (six) hours as needed (shortness of breath or wheezing).    Yes [provider]  meclizine (ANTIVERT) 12.5 MG tablet Take 12.5 mg by mouth 3 (three) times daily as needed for dizziness. 06/15/16  Yes [provider]  Multiple Vitamin (MULTIVITAMIN) tablet Take 1 tablet by mouth daily.   Yes [provider]  rivaroxaban (XARELTO) 20 MG TABS tablet Take 1 tablet (20 mg total) by mouth daily with supper. 04/09/16  Yes [name], MD    Physical Exam: Vitals:   02/18/17 1730 02/18/17 1940 02/18/17 2204 02/18/17 2325  BP:  (!) 148/90 (!) 131/51 134/62  Pulse: 76 79 77 80  Resp: (!) 21 14 19 20   Temp:  97.9 F (36.6 C)  98 F (36.7 C)  TempSrc:  Oral  Oral  SpO2:  97% 99% 98%  Weight:    68.3 kg (150 lb 9.2 oz)  Height:    5\' 2"  (1.575 m)      Constitutional: Moderately built and nourished. Vitals:   02/18/17 1730 02/18/17 1940 02/18/17 2204 02/18/17 2325  BP:  (!) 148/90 (!) 131/51 134/62  Pulse: 76 79 77 80  Resp: (!) 21 14 19 20   Temp:  97.9 F (36.6 C)  98 F (36.7 C)  TempSrc:  Oral  Oral  SpO2:  97% 99% 98%  Weight:    68.3 kg (150 lb 9.2 oz)  Height:    5\' 2"  (1.575 m)   Eyes: Anicteric no pallor. ENMT: No discharge from the ears eyes nose and mouth. Neck: No mass felt. No neck rigidity. Respiratory: No rhonchi or crepitations. Cardiovascular: S1 and S2 heard no murmurs appreciated. Abdomen: Soft nontender bowel sounds present. Musculoskeletal: No edema. No joint effusion. Skin: No rash. Skin appears warm. Neurologic: Alert awake oriented to time place and person. Moves all extremities. Psychiatric: Appears normal. Normal affect.   Labs on Admission: I have  personally reviewed following labs and imaging studies  CBC:  Recent Labs Lab 02/18/17 0946 02/18/17 1749  WBC 4.2 6.5  NEUTROABS 2.6 5.8  HGB 13.4 13.8  HCT 38.2 39.2  MCV 97.3 95.8  PLT 147 250*   Basic Metabolic Panel:  Recent Labs Lab 02/18/17 0946 02/18/17 1749  NA 140 138  K 4.1 4.0  CL  --  102  CO2 26 26  GLUCOSE 142* 188*  BUN 8.7 10  CREATININE 0.7 0.60  CALCIUM 9.5 9.3   GFR: Estimated Creatinine Clearance: 56.8 mL/min (by C-G formula based on SCr of 0.6 mg/dL). Liver Function Tests:  Recent Labs Lab 02/18/17 0946 02/18/17 1749  AST 35* 43*  ALT 30 33  ALKPHOS 110 109  BILITOT 1.56* 1.8*  PROT 6.8 7.7  ALBUMIN 3.7 4.3   No results for input(s): LIPASE, AMYLASE in the last 168 hours. No results for input(s): AMMONIA in the last 168 hours. Coagulation Profile: No results for input(s): INR, PROTIME in the last 168 hours. Cardiac Enzymes: No results for input(s): CKTOTAL, CKMB, CKMBINDEX, TROPONINI in the last 168 hours. BNP (last 3 results) No results for input(s): PROBNP in the last 8760 hours. HbA1C: No results for input(s): HGBA1C in the last 72 hours. CBG: No results for input(s): GLUCAP in the last 168 hours. Lipid Profile: No results for input(s): CHOL, HDL, LDLCALC, TRIG, CHOLHDL, LDLDIRECT in the last 72 hours. Thyroid Function Tests: No results for input(s): TSH, T4TOTAL, FREET4, T3FREE, THYROIDAB in the last 72 hours. Anemia Panel: No results for input(s): VITAMINB12, FOLATE, FERRITIN, TIBC, IRON, RETICCTPCT in the last 72 hours. Urine analysis:    Component Value Date/Time   COLORURINE YELLOW 02/18/2017 1858   APPEARANCEUR CLEAR 02/18/2017 1858   LABSPEC 1.012 02/18/2017 1858   PHURINE 7.0 02/18/2017 1858   GLUCOSEU 150 (A) 02/18/2017 1858   HGBUR NEGATIVE 02/18/2017 1858   BILIRUBINUR NEGATIVE 02/18/2017 1858   KETONESUR NEGATIVE 02/18/2017 1858   PROTEINUR NEGATIVE 02/18/2017 1858   UROBILINOGEN 0.2 07/29/2014 1028    NITRITE NEGATIVE 02/18/2017 1858   LEUKOCYTESUR NEGATIVE 02/18/2017 1858   Sepsis Labs: @LABRCNTIP (procalcitonin:4,lacticidven:4) )No results found for this or any previous visit (from the past 240 hour(s)).   Radiological Exams on Admission: Ct Chest W Contrast  Result Date: 02/17/2017 CLINICAL DATA:  Non-small cell right lung cancer, stage IV, diagnosed 2013 status post right lobe wedge resection, chemotherapy, and XRT (completed 05/2016). Currently receiving chemotherapy. EXAM: CT CHEST, ABDOMEN, AND PELVIS WITH CONTRAST  TECHNIQUE: Multidetector CT imaging of the chest, abdomen and pelvis was performed following the standard protocol during bolus administration of intravenous contrast. CONTRAST:  175mL ISOVUE-300 IOPAMIDOL (ISOVUE-300) INJECTION 61% COMPARISON:  12/01/2016 FINDINGS: CT CHEST FINDINGS Cardiovascular: Heart is normal in size.  No pericardial effusion. Coronary atherosclerosis the LAD and left circumflex. No evidence of thoracic aortic aneurysm. Mild atherosclerotic calcifications of the aortic arch. Mediastinum/Nodes: No suspicious mediastinal, hilar, or axillary lymphadenopathy. Visualized thyroid is unremarkable. Lungs/Pleura: Status post right upper lobectomy. Near complete resolution of prior bilateral pulmonary nodules. Residual nodular scarring along the posterior aspect of the right middle lobe (series 6/ image 49). Residual nodular scarring in the posterior left upper lobe (series 6/image 72). Residual nodular scarring in the anterior right lower lobe (series 6/ image 90) and in the medial right lung base (series 6/image 105). Stable radiation changes in the medial right lower lobe (series 6/ image 52). Mild left apical pleural-parenchymal scarring. Mild subpleural scarring along the lateral right lower lobe. No focal consolidation. No pleural effusion or pneumothorax. Musculoskeletal: Mild degenerative changes of the thoracic spine. CT ABDOMEN PELVIS FINDINGS Hepatobiliary:  Mildly nodular hepatic contour, suggesting cirrhosis. No focal hepatic lesion is seen. Layering gallstones (series 2/ image 61), without associated inflammatory changes. Pancreas: Within normal limits. Spleen: Spleen is mildly enlarged, measuring 15.1 cm in maximal axial dimension. 10 mm splenic cyst posteriorly (series 2/image 60). Adrenals/Urinary Tract: Adrenal glands are within normal limits. Kidneys are within normal limits.  No hydronephrosis. Bladder is within normal limits. Stomach/Bowel: Stomach is within normal limits. No evidence of bowel obstruction. Normal appendix (series 2/image 82). Mild sigmoid diverticulosis, without evidence of diverticulitis. Vascular/Lymphatic: No evidence of abdominal aortic aneurysm. Atherosclerotic calcifications of the abdominal aorta and branch vessels. Portal vein is patent. Recanalized periumbilical vein (series 2/image 59). Small perigastric varices. No suspicious abdominopelvic lymphadenopathy. Small upper abdominal lymph nodes measuring up to 7 mm short axis, within normal limits. Reproductive: Status post hysterectomy. Bilateral ovaries are within normal limits. Other: No abdominopelvic ascites. Musculoskeletal: Mild degenerative changes of the lumbar spine. IMPRESSION: Status post right upper lobectomy. Radiation changes in the medial right lower lobe. Prior bilateral pulmonary nodules/ metastases have essentially resolved, with minimal residual nodular scarring. No evidence of metastatic disease in the abdomen/pelvis. Cirrhosis with stigmata of portal hypertension. Portal vein is patent. Additional stable ancillary findings as above. Electronically Signed   By: Julian Hy M.D.   On: 02/17/2017 15:08   Ct Abdomen Pelvis W Contrast  Result Date: 02/17/2017 CLINICAL DATA:  Non-small cell right lung cancer, stage IV, diagnosed 2013 status post right lobe wedge resection, chemotherapy, and XRT (completed 05/2016). Currently receiving chemotherapy. EXAM: CT  CHEST, ABDOMEN, AND PELVIS WITH CONTRAST TECHNIQUE: Multidetector CT imaging of the chest, abdomen and pelvis was performed following the standard protocol during bolus administration of intravenous contrast. CONTRAST:  154mL ISOVUE-300 IOPAMIDOL (ISOVUE-300) INJECTION 61% COMPARISON:  12/01/2016 FINDINGS: CT CHEST FINDINGS Cardiovascular: Heart is normal in size.  No pericardial effusion. Coronary atherosclerosis the LAD and left circumflex. No evidence of thoracic aortic aneurysm. Mild atherosclerotic calcifications of the aortic arch. Mediastinum/Nodes: No suspicious mediastinal, hilar, or axillary lymphadenopathy. Visualized thyroid is unremarkable. Lungs/Pleura: Status post right upper lobectomy. Near complete resolution of prior bilateral pulmonary nodules. Residual nodular scarring along the posterior aspect of the right middle lobe (series 6/ image 49). Residual nodular scarring in the posterior left upper lobe (series 6/image 72). Residual nodular scarring in the anterior right lower lobe (series 6/ image 90) and in  the medial right lung base (series 6/image 105). Stable radiation changes in the medial right lower lobe (series 6/ image 52). Mild left apical pleural-parenchymal scarring. Mild subpleural scarring along the lateral right lower lobe. No focal consolidation. No pleural effusion or pneumothorax. Musculoskeletal: Mild degenerative changes of the thoracic spine. CT ABDOMEN PELVIS FINDINGS Hepatobiliary: Mildly nodular hepatic contour, suggesting cirrhosis. No focal hepatic lesion is seen. Layering gallstones (series 2/ image 61), without associated inflammatory changes. Pancreas: Within normal limits. Spleen: Spleen is mildly enlarged, measuring 15.1 cm in maximal axial dimension. 10 mm splenic cyst posteriorly (series 2/image 60). Adrenals/Urinary Tract: Adrenal glands are within normal limits. Kidneys are within normal limits.  No hydronephrosis. Bladder is within normal limits. Stomach/Bowel:  Stomach is within normal limits. No evidence of bowel obstruction. Normal appendix (series 2/image 82). Mild sigmoid diverticulosis, without evidence of diverticulitis. Vascular/Lymphatic: No evidence of abdominal aortic aneurysm. Atherosclerotic calcifications of the abdominal aorta and branch vessels. Portal vein is patent. Recanalized periumbilical vein (series 2/image 59). Small perigastric varices. No suspicious abdominopelvic lymphadenopathy. Small upper abdominal lymph nodes measuring up to 7 mm short axis, within normal limits. Reproductive: Status post hysterectomy. Bilateral ovaries are within normal limits. Other: No abdominopelvic ascites. Musculoskeletal: Mild degenerative changes of the lumbar spine. IMPRESSION: Status post right upper lobectomy. Radiation changes in the medial right lower lobe. Prior bilateral pulmonary nodules/ metastases have essentially resolved, with minimal residual nodular scarring. No evidence of metastatic disease in the abdomen/pelvis. Cirrhosis with stigmata of portal hypertension. Portal vein is patent. Additional stable ancillary findings as above. Electronically Signed   By: Julian Hy M.D.   On: 02/17/2017 15:08   Dg Chest Port 1 View  Result Date: 02/18/2017 CLINICAL DATA:  Nausea and vomiting EXAM: PORTABLE CHEST 1 VIEW COMPARISON:  06/14/2016 chest radiograph. FINDINGS: Stable cardiomediastinal silhouette with normal heart size. No pneumothorax. Surgical clips overlie the right axilla. Surgical sutures are seen in the medial right hemithorax with surgical clips overlying the right hilum, unchanged. Loop recorder overlies the lower heart. Stable chronic blunting of the right costophrenic angle compatible with pleural-parenchymal scarring. No pulmonary edema. Mild scarring versus atelectasis at the left costophrenic angle. No acute consolidative airspace disease. IMPRESSION: 1. Stable postsurgical changes in the right hemithorax. 2. Mild scarring versus  atelectasis at the left costophrenic angle. Otherwise no active disease in the chest. Electronically Signed   By: Ilona Sorrel M.D.   On: 02/18/2017 18:07     Assessment/Plan Principal Problem:   SIRS (systemic inflammatory response syndrome) (HCC) Active Problems:   Non-small cell carcinoma of right lung, stage 4 (HCC)   Hepatic cirrhosis (HCC)   Nausea and vomiting   Weakness    1. SIRS - source not clear. Patient was empirically started on antibiotics. Blood cultures were obtained. For now I will hold off any further antibiotics. And will check procalcitonin and if elevated will continue antibiotics until blood cultures results are available. Follow lactate levels. Continue hydration. 2. Nausea vomiting - probably secondary to Dilaudid which has improved. 3. Paroxysmal atrial fibrillation - on xarelto and Cardizem. Chads 2 vasc score more than 2. 4. Tachybradycardia syndrome status post pacemaker placement. 5. Metastatic lung cancer per oncology.   DVT prophylaxis: Xarelto. Code Status: Full code.  Family Communication: Discussed with patient.  Disposition Plan: Home.  Consults called: None.  Admission status: Observation.    Rise Patience MD [facility] Pager [phone].  If 7PM-7AM, please contact night-coverage [email or web address] Password [facility]  02/19/2017, 12:43 AM

## 2017-02-19 NOTE — Progress Notes (Signed)
02/19/17  1517  Paged MD regarding continuing or stopping fluids. Per MD stop fluids.

## 2017-02-19 NOTE — Progress Notes (Signed)
Report received from R. Mike Gip, RN. No change from initial assessment. Will continue to monitor and follow the POC.

## 2017-02-19 NOTE — Care Management Note (Signed)
Case Management Note  Patient Details  Name: Nancy Hodges MRN: [id number] Date of Birth: [birth year]  Subjective/Objective: 71 y/o f admitted w/SIRS. From home,alone.Has 3n1,cane,rw.                   Action/Plan:d/c plan home.   Expected Discharge Date:                  Expected Discharge Plan:  Home/Self Care  In-House Referral:     Discharge planning Services  CM Consult  Post Acute Care Choice:    Choice offered to:     DME Arranged:    DME Agency:     HH Arranged:    HH Agency:     Status of Service:  In process, will continue to follow  If discussed at Long Length of Stay Meetings, dates discussed:    Additional Comments:  Dessa Phi, RN 02/19/2017, 11:01 AM

## 2017-02-20 DIAGNOSIS — C3491 Malignant neoplasm of unspecified part of right bronchus or lung: Secondary | ICD-10-CM

## 2017-02-20 LAB — CBC
HCT: 36.5 % (ref 36.0–46.0)
Hemoglobin: 12.7 g/dL (ref 12.0–15.0)
MCH: 33.4 pg (ref 26.0–34.0)
MCHC: 34.8 g/dL (ref 30.0–36.0)
MCV: 96.1 fL (ref 78.0–100.0)
PLATELETS: 129 10*3/uL — AB (ref 150–400)
RBC: 3.8 MIL/uL — ABNORMAL LOW (ref 3.87–5.11)
RDW: 13.4 % (ref 11.5–15.5)
WBC: 6.6 10*3/uL (ref 4.0–10.5)

## 2017-02-20 LAB — URINE CULTURE

## 2017-02-20 LAB — EPSTEIN-BARR VIRUS VCA, IGG: EBV VCA IGG: 235 U/mL — AB (ref 0.0–17.9)

## 2017-02-20 LAB — EPSTEIN-BARR VIRUS VCA, IGM

## 2017-02-20 LAB — CMV IGM

## 2017-02-20 MED ORDER — SODIUM CHLORIDE 0.9% FLUSH: 3.0000 mL | Freq: Two times a day (BID) | INTRAVENOUS | Status: DC

## 2017-02-20 NOTE — Plan of Care (Signed)
Problem: Safety: Goal: Ability to remain free from injury will improve Outcome: Progressing Pt able to ambulate independently.

## 2017-02-20 NOTE — Discharge Summary (Signed)
Physician Discharge Summary  Nancy Hodges:[id number] DOB: 1946-05-16 DOA: 02/18/2017  PCP: [name], PA-C  Admit date: 02/18/2017 Discharge date: 02/20/2017  Admitted From: Home Disposition:  Home  Recommendations for Outpatient Follow-up:  1. Follow up with PCP in 1-2 weeks 2. Please obtain BMP in one week  Home Health:no NoneEquipment/Devices:None  Discharge Condition:stable CODE STATUS:full Diet recommendation: Heart Healthy  Brief/Interim Summary: 71 y.o. female with past medical history of metastatic non-small cell cancer, paroxysmal atrial fibrillation and tachybradycardia syndrome status post pacemaker was referred to the ED after receiving Dilaudid during chemotherapy as she was complaining of back pain. In the ED she was found to be hypothermic and lactic acid was  Elevated.  Discharge Diagnoses:  Principal Problem:   SIRS (systemic inflammatory response syndrome) (HCC) Active Problems:   Non-small cell carcinoma of right lung, stage 4 (HCC)   Hepatic cirrhosis (HCC)   Nausea and vomiting   Weakness  Orthostatic hypotension: Likely due to dehydration, in the setting of decreased oral intake chemotherapy infusion and IV Dilaudid. Patient given a good history about feeling lightheaded, nauseated with  and weak upon standing, which was made worse with IV Dilaudid. Orthostatics were not checked on admission. On admission her hemoglobin was 13.8 after fluid hydration it dropped her baseline of 11.7,, also on admission her creatinine was elevated which improved with IV fluid hydration to baseline she relates that after she was hydrated the next day her symptoms resolved.  Acute kidney injury: Likely. Renal inEtiology does resolve with IV fluid hydration.  Non-small cell carcinoma of the right lung stage IV: Follow-up with oncologist an outpatient.  Anemia due to chemotherapy: Her hemoglobin had a mild drop on admission likely was hemodilution, due to being  volume contracted.  Paroxysmal atrial fibrillation: She remained rate controlled no change her made her medication. Chads 2 vasc score >  2  Discharge Instructions  Discharge Instructions    Diet - low sodium heart healthy    Complete by:  As directed    Increase activity slowly    Complete by:  As directed      Allergies as of 02/20/2017      Reactions   Prednisone Shortness Of Breath, Palpitations, Other (See Comments)   REACTION: double vision History is not consistent with any allergy.   Septra [sulfamethoxazole-trimethoprim] Shortness Of Breath   Penicillins Swelling, Rash   Has patient had a PCN reaction causing immediate rash, facial/tongue/throat swelling, SOB or lightheadedness with hypotension: Yes Has patient had a PCN reaction causing severe rash involving mucus membranes or skin necrosis: No Has patient had a PCN reaction that required hospitalization No Has patient had a PCN reaction occurring within the last 10 years: No If all of the above answers are "NO", then may proceed with Cephalosporin use.   Vioxx [rofecoxib] Other (See Comments)   REACTION:  Stomach cramps      Medication List    TAKE these medications   acetaminophen 500 MG tablet Commonly known as:  TYLENOL Take 500 mg by mouth every 6 (six) hours as needed for moderate pain or fever.   albuterol 108 (90 Base) MCG/ACT inhaler Commonly known as:  PROVENTIL HFA;VENTOLIN HFA Inhale 1-2 puffs into the lungs every 6 (six) hours as needed for wheezing or shortness of breath.   diltiazem 240 MG 24 hr capsule Commonly known as:  CARDIZEM CD Take 1 capsule (240 mg total) by mouth daily.   fexofenadine 180 MG tablet Commonly known as:  ALLEGRA Take 180 mg by mouth daily.   fluticasone 50 MCG/ACT nasal spray Commonly known as:  FLONASE Place 1 spray into both nostrils daily.   hydroxypropyl methylcellulose / hypromellose 2.5 % ophthalmic solution Commonly known as:  ISOPTO TEARS / GONIOVISC Place  1-2 drops into the left eye as needed for dry eyes.   ipratropium-albuterol 0.5-2.5 (3) MG/3ML Soln Commonly known as:  DUONEB Take 3 mLs by nebulization every 6 (six) hours as needed (shortness of breath or wheezing).   meclizine 12.5 MG tablet Commonly known as:  ANTIVERT Take 12.5 mg by mouth 3 (three) times daily as needed for dizziness.   multivitamin tablet Take 1 tablet by mouth daily.   rivaroxaban 20 MG Tabs tablet Commonly known as:  XARELTO Take 1 tablet (20 mg total) by mouth daily with supper.       Allergies  Allergen Reactions  . Prednisone Shortness Of Breath, Palpitations and Other (See Comments)    REACTION: double vision History is not consistent with any allergy.  Sarina Ill [Sulfamethoxazole-Trimethoprim] Shortness Of Breath  . Penicillins Swelling and Rash    Has patient had a PCN reaction causing immediate rash, facial/tongue/throat swelling, SOB or lightheadedness with hypotension: Yes Has patient had a PCN reaction causing severe rash involving mucus membranes or skin necrosis: No Has patient had a PCN reaction that required hospitalization No Has patient had a PCN reaction occurring within the last 10 years: No If all of the above answers are "NO", then may proceed with Cephalosporin use.   . Vioxx [Rofecoxib] Other (See Comments)    REACTION:  Stomach cramps    Consultations:  None   Procedures/Studies: Ct Chest W Contrast  Result Date: 02/17/2017 CLINICAL DATA:  Non-small cell right lung cancer, stage IV, diagnosed 2013 status post right lobe wedge resection, chemotherapy, and XRT (completed 05/2016). Currently receiving chemotherapy. EXAM: CT CHEST, ABDOMEN, AND PELVIS WITH CONTRAST TECHNIQUE: Multidetector CT imaging of the chest, abdomen and pelvis was performed following the standard protocol during bolus administration of intravenous contrast. CONTRAST:  131mL ISOVUE-300 IOPAMIDOL (ISOVUE-300) INJECTION 61% COMPARISON:  12/01/2016 FINDINGS:  CT CHEST FINDINGS Cardiovascular: Heart is normal in size.  No pericardial effusion. Coronary atherosclerosis the LAD and left circumflex. No evidence of thoracic aortic aneurysm. Mild atherosclerotic calcifications of the aortic arch. Mediastinum/Nodes: No suspicious mediastinal, hilar, or axillary lymphadenopathy. Visualized thyroid is unremarkable. Lungs/Pleura: Status post right upper lobectomy. Near complete resolution of prior bilateral pulmonary nodules. Residual nodular scarring along the posterior aspect of the right middle lobe (series 6/ image 49). Residual nodular scarring in the posterior left upper lobe (series 6/image 72). Residual nodular scarring in the anterior right lower lobe (series 6/ image 90) and in the medial right lung base (series 6/image 105). Stable radiation changes in the medial right lower lobe (series 6/ image 52). Mild left apical pleural-parenchymal scarring. Mild subpleural scarring along the lateral right lower lobe. No focal consolidation. No pleural effusion or pneumothorax. Musculoskeletal: Mild degenerative changes of the thoracic spine. CT ABDOMEN PELVIS FINDINGS Hepatobiliary: Mildly nodular hepatic contour, suggesting cirrhosis. No focal hepatic lesion is seen. Layering gallstones (series 2/ image 61), without associated inflammatory changes. Pancreas: Within normal limits. Spleen: Spleen is mildly enlarged, measuring 15.1 cm in maximal axial dimension. 10 mm splenic cyst posteriorly (series 2/image 60). Adrenals/Urinary Tract: Adrenal glands are within normal limits. Kidneys are within normal limits.  No hydronephrosis. Bladder is within normal limits. Stomach/Bowel: Stomach is within normal limits. No evidence of bowel obstruction. Normal  appendix (series 2/image 82). Mild sigmoid diverticulosis, without evidence of diverticulitis. Vascular/Lymphatic: No evidence of abdominal aortic aneurysm. Atherosclerotic calcifications of the abdominal aorta and branch vessels.  Portal vein is patent. Recanalized periumbilical vein (series 2/image 59). Small perigastric varices. No suspicious abdominopelvic lymphadenopathy. Small upper abdominal lymph nodes measuring up to 7 mm short axis, within normal limits. Reproductive: Status post hysterectomy. Bilateral ovaries are within normal limits. Other: No abdominopelvic ascites. Musculoskeletal: Mild degenerative changes of the lumbar spine. IMPRESSION: Status post right upper lobectomy. Radiation changes in the medial right lower lobe. Prior bilateral pulmonary nodules/ metastases have essentially resolved, with minimal residual nodular scarring. No evidence of metastatic disease in the abdomen/pelvis. Cirrhosis with stigmata of portal hypertension. Portal vein is patent. Additional stable ancillary findings as above. Electronically Signed   By: Julian Hy M.D.   On: 02/17/2017 15:08   Ct Abdomen Pelvis W Contrast  Result Date: 02/17/2017 CLINICAL DATA:  Non-small cell right lung cancer, stage IV, diagnosed 2013 status post right lobe wedge resection, chemotherapy, and XRT (completed 05/2016). Currently receiving chemotherapy. EXAM: CT CHEST, ABDOMEN, AND PELVIS WITH CONTRAST TECHNIQUE: Multidetector CT imaging of the chest, abdomen and pelvis was performed following the standard protocol during bolus administration of intravenous contrast. CONTRAST:  116mL ISOVUE-300 IOPAMIDOL (ISOVUE-300) INJECTION 61% COMPARISON:  12/01/2016 FINDINGS: CT CHEST FINDINGS Cardiovascular: Heart is normal in size.  No pericardial effusion. Coronary atherosclerosis the LAD and left circumflex. No evidence of thoracic aortic aneurysm. Mild atherosclerotic calcifications of the aortic arch. Mediastinum/Nodes: No suspicious mediastinal, hilar, or axillary lymphadenopathy. Visualized thyroid is unremarkable. Lungs/Pleura: Status post right upper lobectomy. Near complete resolution of prior bilateral pulmonary nodules. Residual nodular scarring along the  posterior aspect of the right middle lobe (series 6/ image 49). Residual nodular scarring in the posterior left upper lobe (series 6/image 72). Residual nodular scarring in the anterior right lower lobe (series 6/ image 90) and in the medial right lung base (series 6/image 105). Stable radiation changes in the medial right lower lobe (series 6/ image 52). Mild left apical pleural-parenchymal scarring. Mild subpleural scarring along the lateral right lower lobe. No focal consolidation. No pleural effusion or pneumothorax. Musculoskeletal: Mild degenerative changes of the thoracic spine. CT ABDOMEN PELVIS FINDINGS Hepatobiliary: Mildly nodular hepatic contour, suggesting cirrhosis. No focal hepatic lesion is seen. Layering gallstones (series 2/ image 61), without associated inflammatory changes. Pancreas: Within normal limits. Spleen: Spleen is mildly enlarged, measuring 15.1 cm in maximal axial dimension. 10 mm splenic cyst posteriorly (series 2/image 60). Adrenals/Urinary Tract: Adrenal glands are within normal limits. Kidneys are within normal limits.  No hydronephrosis. Bladder is within normal limits. Stomach/Bowel: Stomach is within normal limits. No evidence of bowel obstruction. Normal appendix (series 2/image 82). Mild sigmoid diverticulosis, without evidence of diverticulitis. Vascular/Lymphatic: No evidence of abdominal aortic aneurysm. Atherosclerotic calcifications of the abdominal aorta and branch vessels. Portal vein is patent. Recanalized periumbilical vein (series 2/image 59). Small perigastric varices. No suspicious abdominopelvic lymphadenopathy. Small upper abdominal lymph nodes measuring up to 7 mm short axis, within normal limits. Reproductive: Status post hysterectomy. Bilateral ovaries are within normal limits. Other: No abdominopelvic ascites. Musculoskeletal: Mild degenerative changes of the lumbar spine. IMPRESSION: Status post right upper lobectomy. Radiation changes in the medial right  lower lobe. Prior bilateral pulmonary nodules/ metastases have essentially resolved, with minimal residual nodular scarring. No evidence of metastatic disease in the abdomen/pelvis. Cirrhosis with stigmata of portal hypertension. Portal vein is patent. Additional stable ancillary findings as above. Electronically Signed  By: Julian Hy M.D.   On: 02/17/2017 15:08   Dg Chest Port 1 View  Result Date: 02/18/2017 CLINICAL DATA:  Nausea and vomiting EXAM: PORTABLE CHEST 1 VIEW COMPARISON:  06/14/2016 chest radiograph. FINDINGS: Stable cardiomediastinal silhouette with normal heart size. No pneumothorax. Surgical clips overlie the right axilla. Surgical sutures are seen in the medial right hemithorax with surgical clips overlying the right hilum, unchanged. Loop recorder overlies the lower heart. Stable chronic blunting of the right costophrenic angle compatible with pleural-parenchymal scarring. No pulmonary edema. Mild scarring versus atelectasis at the left costophrenic angle. No acute consolidative airspace disease. IMPRESSION: 1. Stable postsurgical changes in the right hemithorax. 2. Mild scarring versus atelectasis at the left costophrenic angle. Otherwise no active disease in the chest. Electronically Signed   By: Ilona Sorrel M.D.   On: 02/18/2017 18:07    Subjective: She relates she feels great back to her baseline.  Discharge Exam: Vitals:   02/19/17 2328 02/20/17 0527  BP: (!) 117/50 133/70  Pulse: 82 74  Resp: 18 18  Temp: 98.6 F (37 C) 99.2 F (37.3 C)   Vitals:   02/19/17 1000 02/19/17 1333 02/19/17 2328 02/20/17 0527  BP: (!) 144/52 (!) 106/57 (!) 117/50 133/70  Pulse:  68 82 74  Resp:  18 18 18   Temp:  98.2 F (36.8 C) 98.6 F (37 C) 99.2 F (37.3 C)  TempSrc:  Oral Oral Oral  SpO2:  96% 96% 96%  Weight:      Height:        General: Alert and awake and oriented in no acute distress. Cardiovascular: Regular rate and rhythm with positive S1 no JVD Respiratory:  Good air movement and clear to auscultation. Abdominal: Positive bowel sounds soft nontender nondistended. Extremities: No edema or cyanosis.    The results of significant diagnostics from this hospitalization (including imaging, microbiology, ancillary and laboratory) are listed below for reference.     Microbiology: Recent Results (from the past 240 hour(s))  Blood Culture (routine x 2)     Status: None (Preliminary result)   Collection Time: 02/18/17  5:49 PM  Result Value Ref Range Status   Specimen Description BLOOD LEFT ANTECUBITAL  Final   Special Requests   Final    BOTTLES DRAWN AEROBIC AND ANAEROBIC Blood Culture adequate volume   Culture   Final    NO GROWTH 2 DAYS Performed at [facility], [address], [place], [place] [address]    Report Status PENDING  Incomplete  Blood Culture (routine x 2)     Status: None (Preliminary result)   Collection Time: 02/18/17  6:05 PM  Result Value Ref Range Status   Specimen Description BLOOD LEFT FOREARM  Final   Special Requests   Final    BOTTLES DRAWN AEROBIC ONLY Blood Culture adequate volume   Culture   Final    NO GROWTH 2 DAYS Performed at [facility], [place] [address], [place], [place] [address]    Report Status PENDING  Incomplete  Urine culture     Status: Abnormal   Collection Time: 02/18/17  6:58 PM  Result Value Ref Range Status   Specimen Description URINE, CLEAN CATCH  Final   Special Requests NONE  Final   Culture MULTIPLE SPECIES PRESENT, SUGGEST RECOLLECTION (A)  Final   Report Status 02/20/2017 FINAL  Final     Labs: BNP (last 3 results) No results for input(s): BNP in the last 8760 hours. Basic Metabolic  Panel:  Recent Labs Lab 02/18/17 0946 02/18/17 1749 02/19/17 0458  NA 140 138 141  K 4.1 4.0 4.1  CL  --  102 111  CO2 26 26 25   GLUCOSE 142* 188* 83  BUN 8.7 10 8   CREATININE 0.7 0.60 0.44  CALCIUM 9.5 9.3 8.7*   Liver Function Tests:  Recent Labs Lab 02/18/17 0946  02/18/17 1749 02/19/17 0458  AST 35* 43* 31  ALT 30 33 26  ALKPHOS 110 109 85  BILITOT 1.56* 1.8* 1.4*  PROT 6.8 7.7 6.3*  ALBUMIN 3.7 4.3 3.5   No results for input(s): LIPASE, AMYLASE in the last 168 hours. No results for input(s): AMMONIA in the last 168 hours. CBC:  Recent Labs Lab 02/18/17 0946 02/18/17 1749 02/19/17 0458 02/20/17 0446  WBC 4.2 6.5 5.3 6.6  NEUTROABS 2.6 5.8  --   --   HGB 13.4 13.8 11.7* 12.7  HCT 38.2 39.2 32.5* 36.5  MCV 97.3 95.8 95.3 96.1  PLT 147 139* 123* 129*   Cardiac Enzymes: No results for input(s): CKTOTAL, CKMB, CKMBINDEX, TROPONINI in the last 168 hours. BNP: Invalid input(s): POCBNP CBG: No results for input(s): GLUCAP in the last 168 hours. D-Dimer No results for input(s): DDIMER in the last 72 hours. Hgb A1c No results for input(s): HGBA1C in the last 72 hours. Lipid Profile No results for input(s): CHOL, HDL, LDLCALC, TRIG, CHOLHDL, LDLDIRECT in the last 72 hours. Thyroid function studies No results for input(s): TSH, T4TOTAL, T3FREE, THYROIDAB in the last 72 hours.  Invalid input(s): FREET3 Anemia work up No results for input(s): VITAMINB12, FOLATE, FERRITIN, TIBC, IRON, RETICCTPCT in the last 72 hours. Urinalysis    Component Value Date/Time   COLORURINE YELLOW 02/18/2017 1858   APPEARANCEUR CLEAR 02/18/2017 1858   LABSPEC 1.012 02/18/2017 1858   PHURINE 7.0 02/18/2017 1858   GLUCOSEU 150 (A) 02/18/2017 1858   HGBUR NEGATIVE 02/18/2017 1858   BILIRUBINUR NEGATIVE 02/18/2017 1858   KETONESUR NEGATIVE 02/18/2017 1858   PROTEINUR NEGATIVE 02/18/2017 1858   UROBILINOGEN 0.2 07/29/2014 1028   NITRITE NEGATIVE 02/18/2017 1858   LEUKOCYTESUR NEGATIVE 02/18/2017 1858   Sepsis Labs Invalid input(s): PROCALCITONIN,  WBC,  LACTICIDVEN Microbiology Recent Results (from the past 240 hour(s))  Blood Culture (routine x 2)     Status: None (Preliminary result)   Collection Time: 02/18/17  5:49 PM  Result Value Ref Range  Status   Specimen Description BLOOD LEFT ANTECUBITAL  Final   Special Requests   Final    BOTTLES DRAWN AEROBIC AND ANAEROBIC Blood Culture adequate volume   Culture   Final    NO GROWTH 2 DAYS Performed at [facility], [address], [place], [place] [address]    Report Status PENDING  Incomplete  Blood Culture (routine x 2)     Status: None (Preliminary result)   Collection Time: 02/18/17  6:05 PM  Result Value Ref Range Status   Specimen Description BLOOD LEFT FOREARM  Final   Special Requests   Final    BOTTLES DRAWN AEROBIC ONLY Blood Culture adequate volume   Culture   Final    NO GROWTH 2 DAYS Performed at [facility], [place] [address] [place], [place] [address]    Report Status PENDING  Incomplete  Urine culture     Status: Abnormal   Collection Time: 02/18/17  6:58 PM  Result Value Ref Range Status   Specimen Description URINE, CLEAN CATCH  Final   Special Requests  NONE  Final   Culture MULTIPLE SPECIES PRESENT, SUGGEST RECOLLECTION (A)  Final   Report Status 02/20/2017 FINAL  Final     Time coordinating discharge: Over 30 minutes  SIGNED:   Charlynne Cousins, MD  [facility] 02/20/2017, 10:45 AM Pager   If 7PM-7AM, please contact night-coverage [email or web address] Password [email or web address]

## 2017-02-20 NOTE — Care Management Note (Signed)
Case Management Note  Patient Details  Name: Nancy Hodges MRN: [id number] Date of Birth: 1946/05/07  Subjective/Objective:                    Action/Plan:d/c home.   Expected Discharge Date:                  Expected Discharge Plan:  Home/Self Care  In-House Referral:     Discharge planning Services  CM Consult  Post Acute Care Choice:    Choice offered to:     DME Arranged:    DME Agency:     HH Arranged:    [place] Agency:     Status of Service:  Completed, signed off  If discussed at [employer] of Stay Meetings, dates discussed:    Additional Comments:  Dessa Phi, RN 02/20/2017, 10:28 AM

## 2017-02-20 NOTE — Plan of Care (Signed)
Problem: Nutrition: Goal: Adequate nutrition will be maintained Outcome: Progressing Pt able to take in nourishment with no C/O nausea.

## 2017-02-21 ENCOUNTER — Telehealth: Payer: Self-pay

## 2017-02-21 LAB — RSV(RESPIRATORY SYNCYTIAL VIRUS) AB, BLOOD: RSV Ab: NEGATIVE

## 2017-02-21 NOTE — Telephone Encounter (Signed)
Called patient and she is aware of her new appt with kristin 7/31 Nancy Hodges

## 2017-02-23 LAB — CULTURE, BLOOD (ROUTINE X 2)
CULTURE: NO GROWTH
CULTURE: NO GROWTH
SPECIAL REQUESTS: ADEQUATE
Special Requests: ADEQUATE

## 2017-03-03 ENCOUNTER — Encounter: Payer: Self-pay | Admitting: Oncology

## 2017-03-03 ENCOUNTER — Ambulatory Visit (HOSPITAL_BASED_OUTPATIENT_CLINIC_OR_DEPARTMENT_OTHER): Payer: Medicare Other | Admitting: Oncology

## 2017-03-03 ENCOUNTER — Ambulatory Visit (HOSPITAL_BASED_OUTPATIENT_CLINIC_OR_DEPARTMENT_OTHER): Payer: Medicare Other

## 2017-03-03 ENCOUNTER — Other Ambulatory Visit (HOSPITAL_BASED_OUTPATIENT_CLINIC_OR_DEPARTMENT_OTHER): Payer: Medicare Other

## 2017-03-03 VITALS — BP 142/48 | HR 75 | Temp 98.2°F | Resp 18 | Ht 62.0 in | Wt 146.6 lb

## 2017-03-03 DIAGNOSIS — C3411 Malignant neoplasm of upper lobe, right bronchus or lung: Secondary | ICD-10-CM

## 2017-03-03 DIAGNOSIS — Z5112 Encounter for antineoplastic immunotherapy: Secondary | ICD-10-CM

## 2017-03-03 DIAGNOSIS — C773 Secondary and unspecified malignant neoplasm of axilla and upper limb lymph nodes: Secondary | ICD-10-CM

## 2017-03-03 DIAGNOSIS — C3491 Malignant neoplasm of unspecified part of right bronchus or lung: Secondary | ICD-10-CM

## 2017-03-03 DIAGNOSIS — Z79899 Other long term (current) drug therapy: Secondary | ICD-10-CM

## 2017-03-03 DIAGNOSIS — R5382 Chronic fatigue, unspecified: Secondary | ICD-10-CM

## 2017-03-03 LAB — COMPREHENSIVE METABOLIC PANEL
ALBUMIN: 3.9 g/dL (ref 3.5–5.0)
ALK PHOS: 114 U/L (ref 40–150)
ALT: 27 U/L (ref 0–55)
AST: 35 U/L — AB (ref 5–34)
Anion Gap: 8 mEq/L (ref 3–11)
BUN: 8.2 mg/dL (ref 7.0–26.0)
CALCIUM: 10.2 mg/dL (ref 8.4–10.4)
CO2: 26 mEq/L (ref 22–29)
Chloride: 106 mEq/L (ref 98–109)
Creatinine: 0.8 mg/dL (ref 0.6–1.1)
EGFR: 74 mL/min/{1.73_m2} — ABNORMAL LOW (ref >90)
GLUCOSE: 128 mg/dL (ref 70–140)
POTASSIUM: 4.2 meq/L (ref 3.5–5.1)
Sodium: 140 mEq/L (ref 136–145)
Total Bilirubin: 1.36 mg/dL — ABNORMAL HIGH (ref 0.20–1.20)
Total Protein: 7.2 g/dL (ref 6.4–8.3)

## 2017-03-03 LAB — CBC WITH DIFFERENTIAL/PLATELET
BASO%: 1.1 % (ref 0.0–2.0)
Basophils Absolute: 0.1 10*3/uL (ref 0.0–0.1)
EOS%: 2.4 % (ref 0.0–7.0)
Eosinophils Absolute: 0.1 10*3/uL (ref 0.0–0.5)
HEMATOCRIT: 41.6 % (ref 34.8–46.6)
HEMOGLOBIN: 14.4 g/dL (ref 11.6–15.9)
LYMPH#: 1.1 10*3/uL (ref 0.9–3.3)
LYMPH%: 21.6 % (ref 14.0–49.7)
MCH: 33.8 pg (ref 25.1–34.0)
MCHC: 34.7 g/dL (ref 31.5–36.0)
MCV: 97.4 fL (ref 79.5–101.0)
MONO#: 0.3 10*3/uL (ref 0.1–0.9)
MONO%: 6.2 % (ref 0.0–14.0)
NEUT%: 68.7 % (ref 38.4–76.8)
NEUTROS ABS: 3.4 10*3/uL (ref 1.5–6.5)
Platelets: 195 10*3/uL (ref 145–400)
RBC: 4.27 10*6/uL (ref 3.70–5.45)
RDW: 13.7 % (ref 11.2–14.5)
WBC: 4.9 10*3/uL (ref 3.9–10.3)

## 2017-03-03 LAB — TSH: TSH: 1.938 m[IU]/L (ref 0.308–3.960)

## 2017-03-03 MED ORDER — SODIUM CHLORIDE 0.9 % IV SOLN
Freq: Once | INTRAVENOUS | Status: AC
  Administered 2017-03-03: 12:00:00 via INTRAVENOUS

## 2017-03-03 MED ORDER — SODIUM CHLORIDE 0.9 % IV SOLN
240.0000 mg | Freq: Once | INTRAVENOUS | Status: AC
  Administered 2017-03-03: 240 mg via INTRAVENOUS
  Filled 2017-03-03: qty 24

## 2017-03-03 NOTE — Progress Notes (Signed)
At [facility] paused, complaining of lower back pain with pain down right buttocks towards leg. Assisted to stand up and move around, states she has a pinched nerve that bothers her at times. Restarted Opidivo at 1300 with no complaints for the remainder of infusion.

## 2017-03-03 NOTE — Patient Instructions (Signed)
[facility] Discharge Instructions for Patients Receiving Chemotherapy  Today you received the following chemotherapy agents:  Nivolumab.  To help prevent nausea and vomiting after your treatment, we encourage you to take your nausea medication as directed.   If you develop nausea and vomiting that is not controlled by your nausea medication, call the clinic.   BELOW ARE SYMPTOMS THAT SHOULD BE REPORTED IMMEDIATELY:  *FEVER GREATER THAN 100.5 F  *CHILLS WITH OR WITHOUT FEVER  NAUSEA AND VOMITING THAT IS NOT CONTROLLED WITH YOUR NAUSEA MEDICATION  *UNUSUAL SHORTNESS OF BREATH  *UNUSUAL BRUISING OR BLEEDING  TENDERNESS IN MOUTH AND THROAT WITH OR WITHOUT PRESENCE OF ULCERS  *URINARY PROBLEMS  *BOWEL PROBLEMS  UNUSUAL RASH Items with * indicate a potential emergency and should be followed up as soon as possible.  Feel free to call the clinic you have any questions or concerns. The clinic phone number is [phone].  Please show the CHEMO ALERT CARD at check-in to the Emergency Department and triage nurse.   

## 2017-03-03 NOTE — Progress Notes (Signed)
[place] Telephone:([phone]) [phone]   Fax:([phone]) [phone]  OFFICE PROGRESS NOTE  Nancy Hodges [address] Nancy Hodges [id number]  DIAGNOSIS: Metastatic non-small cell lung cancer, adenocarcinoma. This was initially diagnosed as stage IB (T2a, N0, M0) in March of 2014, status post right upper lobectomy with lymph node dissection but the patient has evidence for disease recurrence in the right axilla status post resection.  PRIOR THERAPY:  1) Status post right axillary lymph node biopsy on 05/01/2014 under the care of Dr. Roxan Hockey. 2) Systemic chemotherapy with carboplatin for AUC of 5 and Alimta 500 mg/M2 every 3 weeks. First cycle 05/31/2014. Status post 6 cycles. Last dose was given 09/22/2014. 3) palliative radiotherapy to the subcarinal lymph node under the care of Dr. Sondra Come  CURRENT THERAPY:  Nivolumab 240 mg IV every 2 weeks. First dose 12/23/2016. Status post 5 cycles.  INTERVAL HISTORY: Nancy Hodges 71 y.o. female returns to the clinic today for follow-up visit with her daughter. The patient is currently on treatment with immunotherapy with Nivolumab and is still tolerating it fairly well. She was admitted to the hospital following her last dose of Nivolumab. During the infusion she developed severe lower back pain and vomiting. The patient was not improving was sent to the emergency room for evaluation. She was noted to have an elevated lactic acid and was started on IV antibiotics for suspected sepsis. The patient was discharged without a source of infection noted. The patient was thought to be dehydrated. She is feeling better today. She denied having any chest pain, shortness of breath, cough or hemoptysis. She denied having any weight loss or night sweats. She has no nausea, vomiting, diarrhea or constipation. The patient denied having any fever or chills. The patient is here for evaluation before starting cycle #6 of her  treatment.   MEDICAL HISTORY: Past Medical History:  Diagnosis Date  . Allergic rhinitis   . Anxiety   . Arthritis   . Asthma   . Atrial flutter ([place])   . Cholelithiases 09/26/2015  . Diverticulosis   . Encounter for antineoplastic immunotherapy 12/09/2016  . GERD (gastroesophageal reflux disease)   . H/O hiatal hernia   . History of kidney stones   . Internal hemorrhoid   . Lung cancer ([place])    a. Stage IB non-small cell carcinoma, s/p R VATS, wedge resection, RU lobectomy 10/2012.  [name] Other specified diabetes mellitus with hyperglycemia ([place])    hx of chemo induced high blood sugar  . Paroxysmal atrial fibrillation ([facility])    a. anticoagulated with Xarelto  . Pneumonia 06/2016   [facility]  . Pulmonary nodule   . Tachycardia-bradycardia syndrome ([place])    a. 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    ALLERGIES:  is allergic to prednisone; septra [sulfamethoxazole-trimethoprim]; penicillins; and vioxx [rofecoxib].  MEDICATIONS:  Current Outpatient Prescriptions  Medication Sig Dispense Refill  . acetaminophen (TYLENOL) 500 MG tablet Take 500 mg by mouth every 6 (six) hours as needed for moderate pain or fever.     [name] albuterol (PROVENTIL HFA;VENTOLIN HFA) 108 (90 BASE) MCG/ACT inhaler Inhale 1-2 puffs into the lungs every 6 (six) hours as needed for wheezing or shortness of breath.    . diltiazem (CARDIZEM CD) 240 MG 24 hr capsule Take 1 capsule (240 mg total) by mouth daily. 30 capsule 11  . fexofenadine (ALLEGRA) 180 MG tablet Take 180 mg by mouth daily.    . fluticasone (FLONASE) 50  MCG/ACT nasal spray Place 1 spray into both nostrils daily.  3  . hydroxypropyl methylcellulose / hypromellose (ISOPTO TEARS / GONIOVISC) 2.5 % ophthalmic solution Place 1-2 drops into the left eye as needed for dry eyes.    [name] ipratropium-albuterol (DUONEB) 0.5-2.5 (3) MG/3ML SOLN Take 3 mLs by nebulization every 6 (six) hours as needed (shortness of breath or wheezing).     . meclizine  (ANTIVERT) 12.5 MG tablet Take 12.5 mg by mouth 3 (three) times daily as needed for dizziness.    . Multiple Vitamin (MULTIVITAMIN) tablet Take 1 tablet by mouth daily.    . rivaroxaban (XARELTO) 20 MG TABS tablet Take 1 tablet (20 mg total) by mouth daily with supper. 30 tablet 11   No current facility-administered medications for this visit.     SURGICAL HISTORY:  Past Surgical History:  Procedure Laterality Date  . CATARACT EXTRACTION W/PHACO Right 08/14/2016   Procedure: CATARACT EXTRACTION PHACO AND INTRAOCULAR LENS PLACEMENT RIGHT EYE CDE 10.53;  Surgeon: Tonny Branch, MD;  Location: [facility];  Service: Ophthalmology;  Laterality: Right;  right  . CATARACT EXTRACTION W/PHACO Left 09/25/2016   Procedure: CATARACT EXTRACTION PHACO AND INTRAOCULAR LENS PLACEMENT (IOC);  Surgeon: Tonny Branch, MD;  Location: [facility];  Service: Ophthalmology;  Laterality: Left;  left CDE 8.81  . COLONOSCOPY     [facility]: cattered sigmoid diverticula, internal hemorrhoids, sessile polyp in ascending and mid transverse colon (tubular adenoma).   . colonoscopy with polypectomy  2015  . ESOPHAGOGASTRODUODENOSCOPY     [facility]: bile reflux in stomach, mild gastritis, hiatal hernia  . ESOPHAGOGASTRODUODENOSCOPY N/A 01/31/2016   Procedure: ESOPHAGOGASTRODUODENOSCOPY (EGD);  Surgeon: [name], MD;  Location: [facility];  Service: Endoscopy;  Laterality: N/A;  1200  . LOBECTOMY Right 10/27/2012   Procedure: LOBECTOMY;  Surgeon: [name], MD;  Location: [facility];  Service: Thoracic;  Laterality: Right;   RIGHT UPPER LOBECTOMY & Node Dissection  . LYMPH NODE BIOPSY Right 05/01/2014   Procedure: LYMPH NODE BIOPSY, Right Axillary;  Surgeon: [name], MD;  Location: [facility];  Service: Thoracic;  Laterality: Right;  . PARTIAL HYSTERECTOMY    . PERMANENT PACEMAKER INSERTION N/A 09/14/2012   Nanostim (SJM) leadless pacemaker (LEADLESS II STUDY PATIENT) implanted by Dr [name]  . VIDEO ASSISTED THORACOSCOPY  (VATS)/WEDGE RESECTION Right 10/27/2012   Procedure: VIDEO ASSISTED THORACOSCOPY (VATS),RGHT UPPER LOBE LUNG WEDGE RESECTION;  Surgeon: [name], MD;  Location: [facility];  Service: Thoracic;  Laterality: Right;    REVIEW OF SYSTEMS:  A comprehensive review of systems was negative.   PHYSICAL EXAMINATION: General appearance: alert, cooperative and no distress Head: Normocephalic, without obvious abnormality, atraumatic Neck: no adenopathy, no JVD, supple, symmetrical, trachea midline and thyroid not enlarged, symmetric, no tenderness/mass/nodules Lymph nodes: Cervical, supraclavicular, and axillary nodes normal. Resp: clear to auscultation bilaterally Back: symmetric, no curvature. ROM normal. No CVA tenderness. Cardio: regular rate and rhythm, S1, S2 normal, no murmur, click, rub or gallop GI: soft, non-tender; bowel sounds normal; no masses,  no organomegaly Extremities: extremities normal, atraumatic, no cyanosis or edema  ECOG PERFORMANCE STATUS: 0 - Asymptomatic  Blood pressure (!) 142/48, pulse 75, temperature 98.2 F (36.8 C), temperature source Oral, resp. rate 18, height 5\' 2"  (1.575 m), weight 146 lb 9.6 oz (66.5 kg), SpO2 98 %.  LABORATORY DATA: Lab Results  Component Value Date   WBC 4.9 03/03/2017   HGB 14.4 03/03/2017   HCT 41.6 03/03/2017   MCV 97.4 03/03/2017  PLT 195 03/03/2017      Chemistry      Component Value Date/Time   NA 140 03/03/2017 1024   K 4.2 03/03/2017 1024   CL 111 02/19/2017 0458   CO2 26 03/03/2017 1024   BUN 8.2 03/03/2017 1024   CREATININE 0.8 03/03/2017 1024   GLU 134 (H) 02/18/2017 1410      Component Value Date/Time   CALCIUM 10.2 03/03/2017 1024   ALKPHOS 114 03/03/2017 1024   AST 35 (H) 03/03/2017 1024   ALT 27 03/03/2017 1024   BILITOT 1.36 (H) 03/03/2017 1024       RADIOGRAPHIC STUDIES: Ct Chest W Contrast  Result Date: 02/17/2017 CLINICAL DATA:  Non-small cell right lung cancer, stage IV, diagnosed 2013 status  post right lobe wedge resection, chemotherapy, and XRT (completed 05/2016). Currently receiving chemotherapy. EXAM: CT CHEST, ABDOMEN, AND PELVIS WITH CONTRAST TECHNIQUE: Multidetector CT imaging of the chest, abdomen and pelvis was performed following the standard protocol during bolus administration of intravenous contrast. CONTRAST:  157mL ISOVUE-300 IOPAMIDOL (ISOVUE-300) INJECTION 61% COMPARISON:  12/01/2016 FINDINGS: CT CHEST FINDINGS Cardiovascular: Heart is normal in size.  No pericardial effusion. Coronary atherosclerosis the LAD and left circumflex. No evidence of thoracic aortic aneurysm. Mild atherosclerotic calcifications of the aortic arch. Mediastinum/Nodes: No suspicious mediastinal, hilar, or axillary lymphadenopathy. Visualized thyroid is unremarkable. Lungs/Pleura: Status post right upper lobectomy. Near complete resolution of prior bilateral pulmonary nodules. Residual nodular scarring along the posterior aspect of the right middle lobe (series 6/ image 49). Residual nodular scarring in the posterior left upper lobe (series 6/image 72). Residual nodular scarring in the anterior right lower lobe (series 6/ image 90) and in the medial right lung base (series 6/image 105). Stable radiation changes in the medial right lower lobe (series 6/ image 52). Mild left apical pleural-parenchymal scarring. Mild subpleural scarring along the lateral right lower lobe. No focal consolidation. No pleural effusion or pneumothorax. Musculoskeletal: Mild degenerative changes of the thoracic spine. CT ABDOMEN PELVIS FINDINGS Hepatobiliary: Mildly nodular hepatic contour, suggesting cirrhosis. No focal hepatic lesion is seen. Layering gallstones (series 2/ image 61), without associated inflammatory changes. Pancreas: Within normal limits. Spleen: Spleen is mildly enlarged, measuring 15.1 cm in maximal axial dimension. 10 mm splenic cyst posteriorly (series 2/image 60). Adrenals/Urinary Tract: Adrenal glands are within  normal limits. Kidneys are within normal limits.  No hydronephrosis. Bladder is within normal limits. Stomach/Bowel: Stomach is within normal limits. No evidence of bowel obstruction. Normal appendix (series 2/image 82). Mild sigmoid diverticulosis, without evidence of diverticulitis. Vascular/Lymphatic: No evidence of abdominal aortic aneurysm. Atherosclerotic calcifications of the abdominal aorta and branch vessels. Portal vein is patent. Recanalized periumbilical vein (series 2/image 59). Small perigastric varices. No suspicious abdominopelvic lymphadenopathy. Small upper abdominal lymph nodes measuring up to 7 mm short axis, within normal limits. Reproductive: Status post hysterectomy. Bilateral ovaries are within normal limits. Other: No abdominopelvic ascites. Musculoskeletal: Mild degenerative changes of the lumbar spine. IMPRESSION: Status post right upper lobectomy. Radiation changes in the medial right lower lobe. Prior bilateral pulmonary nodules/ metastases have essentially resolved, with minimal residual nodular scarring. No evidence of metastatic disease in the abdomen/pelvis. Cirrhosis with stigmata of portal hypertension. Portal vein is patent. Additional stable ancillary findings as above. Electronically Signed   By: Julian Hy M.D.   On: 02/17/2017 15:08   Ct Abdomen Pelvis W Contrast  Result Date: 02/17/2017 CLINICAL DATA:  Non-small cell right lung cancer, stage IV, diagnosed 2013 status post right lobe wedge resection, chemotherapy, and  XRT (completed 05/2016). Currently receiving chemotherapy. EXAM: CT CHEST, ABDOMEN, AND PELVIS WITH CONTRAST TECHNIQUE: Multidetector CT imaging of the chest, abdomen and pelvis was performed following the standard protocol during bolus administration of intravenous contrast. CONTRAST:  154mL ISOVUE-300 IOPAMIDOL (ISOVUE-300) INJECTION 61% COMPARISON:  12/01/2016 FINDINGS: CT CHEST FINDINGS Cardiovascular: Heart is normal in size.  No pericardial  effusion. Coronary atherosclerosis the LAD and left circumflex. No evidence of thoracic aortic aneurysm. Mild atherosclerotic calcifications of the aortic arch. Mediastinum/Nodes: No suspicious mediastinal, hilar, or axillary lymphadenopathy. Visualized thyroid is unremarkable. Lungs/Pleura: Status post right upper lobectomy. Near complete resolution of prior bilateral pulmonary nodules. Residual nodular scarring along the posterior aspect of the right middle lobe (series 6/ image 49). Residual nodular scarring in the posterior left upper lobe (series 6/image 72). Residual nodular scarring in the anterior right lower lobe (series 6/ image 90) and in the medial right lung base (series 6/image 105). Stable radiation changes in the medial right lower lobe (series 6/ image 52). Mild left apical pleural-parenchymal scarring. Mild subpleural scarring along the lateral right lower lobe. No focal consolidation. No pleural effusion or pneumothorax. Musculoskeletal: Mild degenerative changes of the thoracic spine. CT ABDOMEN PELVIS FINDINGS Hepatobiliary: Mildly nodular hepatic contour, suggesting cirrhosis. No focal hepatic lesion is seen. Layering gallstones (series 2/ image 61), without associated inflammatory changes. Pancreas: Within normal limits. Spleen: Spleen is mildly enlarged, measuring 15.1 cm in maximal axial dimension. 10 mm splenic cyst posteriorly (series 2/image 60). Adrenals/Urinary Tract: Adrenal glands are within normal limits. Kidneys are within normal limits.  No hydronephrosis. Bladder is within normal limits. Stomach/Bowel: Stomach is within normal limits. No evidence of bowel obstruction. Normal appendix (series 2/image 82). Mild sigmoid diverticulosis, without evidence of diverticulitis. Vascular/Lymphatic: No evidence of abdominal aortic aneurysm. Atherosclerotic calcifications of the abdominal aorta and branch vessels. Portal vein is patent. Recanalized periumbilical vein (series 2/image 59). Small  perigastric varices. No suspicious abdominopelvic lymphadenopathy. Small upper abdominal lymph nodes measuring up to 7 mm short axis, within normal limits. Reproductive: Status post hysterectomy. Bilateral ovaries are within normal limits. Other: No abdominopelvic ascites. Musculoskeletal: Mild degenerative changes of the lumbar spine. IMPRESSION: Status post right upper lobectomy. Radiation changes in the medial right lower lobe. Prior bilateral pulmonary nodules/ metastases have essentially resolved, with minimal residual nodular scarring. No evidence of metastatic disease in the abdomen/pelvis. Cirrhosis with stigmata of portal hypertension. Portal vein is patent. Additional stable ancillary findings as above. Electronically Signed   By: Julian Hy M.D.   On: 02/17/2017 15:08   Dg Chest Port 1 View  Result Date: 02/18/2017 CLINICAL DATA:  Nausea and vomiting EXAM: PORTABLE CHEST 1 VIEW COMPARISON:  06/14/2016 chest radiograph. FINDINGS: Stable cardiomediastinal silhouette with normal heart size. No pneumothorax. Surgical clips overlie the right axilla. Surgical sutures are seen in the medial right hemithorax with surgical clips overlying the right hilum, unchanged. Loop recorder overlies the lower heart. Stable chronic blunting of the right costophrenic angle compatible with pleural-parenchymal scarring. No pulmonary edema. Mild scarring versus atelectasis at the left costophrenic angle. No acute consolidative airspace disease. IMPRESSION: 1. Stable postsurgical changes in the right hemithorax. 2. Mild scarring versus atelectasis at the left costophrenic angle. Otherwise no active disease in the chest. Electronically Signed   By: Ilona Sorrel M.D.   On: 02/18/2017 18:07   ASSESSMENT AND PLAN:  This is a very pleasant 71 year old white female with metastatic non-small cell lung cancer, adenocarcinoma with no actionable mutations status post systemic chemotherapy with  carboplatin and Alimta and she is  currently undergoing treatment with second line immunotherapy with Nivolumab status post 5 cycles. The patient is tolerating the treatment well with no significant adverse effects.  CT scan results that were provided to the patient last week were again reviewed with the daughter per her request. Recommend that she proceed with cycle 6 of Nivolumab today as a scheduled. She will follow-up in 2 weeks for cycle 7 of Nivolumab.  She was advised to call immediately if she has any concerning symptoms in the interval. The patient voices understanding of current disease status and treatment options and is in agreement with the current care plan. All questions were answered. The patient knows to call the clinic with any problems, questions or concerns. We can certainly see the patient much sooner if necessary.   [name], DNP, AGPCNP-BC, AOCNP

## 2017-03-17 ENCOUNTER — Telehealth: Payer: Self-pay | Admitting: Internal Medicine

## 2017-03-17 ENCOUNTER — Ambulatory Visit (HOSPITAL_BASED_OUTPATIENT_CLINIC_OR_DEPARTMENT_OTHER): Payer: Medicare Other | Admitting: Internal Medicine

## 2017-03-17 ENCOUNTER — Encounter: Payer: Self-pay | Admitting: Internal Medicine

## 2017-03-17 ENCOUNTER — Other Ambulatory Visit (HOSPITAL_BASED_OUTPATIENT_CLINIC_OR_DEPARTMENT_OTHER): Payer: Medicare Other

## 2017-03-17 ENCOUNTER — Ambulatory Visit (HOSPITAL_BASED_OUTPATIENT_CLINIC_OR_DEPARTMENT_OTHER): Payer: Medicare Other

## 2017-03-17 VITALS — BP 139/67 | HR 115 | Temp 98.3°F | Resp 18 | Ht 62.0 in | Wt 147.7 lb

## 2017-03-17 DIAGNOSIS — Z7189 Other specified counseling: Secondary | ICD-10-CM

## 2017-03-17 DIAGNOSIS — Z5112 Encounter for antineoplastic immunotherapy: Secondary | ICD-10-CM | POA: Diagnosis present

## 2017-03-17 DIAGNOSIS — C773 Secondary and unspecified malignant neoplasm of axilla and upper limb lymph nodes: Secondary | ICD-10-CM | POA: Diagnosis present

## 2017-03-17 DIAGNOSIS — C3491 Malignant neoplasm of unspecified part of right bronchus or lung: Secondary | ICD-10-CM

## 2017-03-17 DIAGNOSIS — C3411 Malignant neoplasm of upper lobe, right bronchus or lung: Secondary | ICD-10-CM

## 2017-03-17 LAB — CBC WITH DIFFERENTIAL/PLATELET
BASO%: 0.7 % (ref 0.0–2.0)
Basophils Absolute: 0 10*3/uL (ref 0.0–0.1)
EOS ABS: 0.1 10*3/uL (ref 0.0–0.5)
EOS%: 1.8 % (ref 0.0–7.0)
HEMATOCRIT: 40.4 % (ref 34.8–46.6)
HEMOGLOBIN: 14.1 g/dL (ref 11.6–15.9)
LYMPH#: 0.9 10*3/uL (ref 0.9–3.3)
LYMPH%: 19.3 % (ref 14.0–49.7)
MCH: 34.1 pg — AB (ref 25.1–34.0)
MCHC: 35 g/dL (ref 31.5–36.0)
MCV: 97.5 fL (ref 79.5–101.0)
MONO#: 0.3 10*3/uL (ref 0.1–0.9)
MONO%: 7.4 % (ref 0.0–14.0)
NEUT%: 70.8 % (ref 38.4–76.8)
NEUTROS ABS: 3.3 10*3/uL (ref 1.5–6.5)
PLATELETS: 184 10*3/uL (ref 145–400)
RBC: 4.15 10*6/uL (ref 3.70–5.45)
RDW: 13.4 % (ref 11.2–14.5)
WBC: 4.7 10*3/uL (ref 3.9–10.3)

## 2017-03-17 LAB — COMPREHENSIVE METABOLIC PANEL
ALT: 31 U/L (ref 0–55)
ANION GAP: 9 meq/L (ref 3–11)
AST: 37 U/L — ABNORMAL HIGH (ref 5–34)
Albumin: 3.8 g/dL (ref 3.5–5.0)
Alkaline Phosphatase: 123 U/L (ref 40–150)
BUN: 10 mg/dL (ref 7.0–26.0)
CALCIUM: 10.3 mg/dL (ref 8.4–10.4)
CHLORIDE: 108 meq/L (ref 98–109)
CO2: 24 meq/L (ref 22–29)
CREATININE: 0.8 mg/dL (ref 0.6–1.1)
EGFR: 74 mL/min/{1.73_m2} — ABNORMAL LOW (ref >90)
Glucose: 115 mg/dl (ref 70–140)
Potassium: 4 mEq/L (ref 3.5–5.1)
Sodium: 141 mEq/L (ref 136–145)
Total Bilirubin: 1.26 mg/dL — ABNORMAL HIGH (ref 0.20–1.20)
Total Protein: 7.2 g/dL (ref 6.4–8.3)

## 2017-03-17 MED ORDER — SODIUM CHLORIDE 0.9 % IV SOLN
Freq: Once | INTRAVENOUS | Status: AC
  Administered 2017-03-17: 12:00:00 via INTRAVENOUS

## 2017-03-17 MED ORDER — SODIUM CHLORIDE 0.9 % IV SOLN
480.0000 mg | Freq: Once | INTRAVENOUS | Status: AC
  Administered 2017-03-17: 480 mg via INTRAVENOUS
  Filled 2017-03-17: qty 48

## 2017-03-17 NOTE — Patient Instructions (Signed)
[facility] Discharge Instructions for Patients Receiving Chemotherapy  Today you received the following chemotherapy agents:  Nivolumab.  To help prevent nausea and vomiting after your treatment, we encourage you to take your nausea medication as directed.   If you develop nausea and vomiting that is not controlled by your nausea medication, call the clinic.   BELOW ARE SYMPTOMS THAT SHOULD BE REPORTED IMMEDIATELY:  *FEVER GREATER THAN 100.5 F  *CHILLS WITH OR WITHOUT FEVER  NAUSEA AND VOMITING THAT IS NOT CONTROLLED WITH YOUR NAUSEA MEDICATION  *UNUSUAL SHORTNESS OF BREATH  *UNUSUAL BRUISING OR BLEEDING  TENDERNESS IN MOUTH AND THROAT WITH OR WITHOUT PRESENCE OF ULCERS  *URINARY PROBLEMS  *BOWEL PROBLEMS  UNUSUAL RASH Items with * indicate a potential emergency and should be followed up as soon as possible.  Feel free to call the clinic you have any questions or concerns. The clinic phone number is [phone].  Please show the CHEMO ALERT CARD at check-in to the Emergency Department and triage nurse.   

## 2017-03-17 NOTE — Progress Notes (Signed)
[facility] [phone]   [phone]  OFFICE PROGRESS NOTE  Nancy [facility] [address] [place] [id number]  DIAGNOSIS: Metastatic non-small cell lung cancer, adenocarcinoma. This was initially diagnosed as stage IB (T2a, N0, M0) in March of 2014, status post right upper lobectomy with lymph node dissection but the patient has evidence for disease recurrence in the right axilla status post resection.  PRIOR THERAPY:  1) Status post right axillary lymph node biopsy on 05/01/2014 under the care of Dr. Roxan [place]. 2) Systemic chemotherapy with carboplatin for AUC of 5 and Alimta 500 mg/M2 every 3 weeks. First cycle 05/31/2014. Status post 6 cycles. Last dose was given 09/22/2014. 3) palliative radiotherapy to the subcarinal lymph node under the care of Dr. Sondra [place]. 4) Nivolumab 240 mg IV every 2 weeks. First dose 12/23/2016. Status post 6 cycles.  CURRENT THERAPY:  Nivolumab 480 mg IV every 4 weeks. First dose 03/17/2017.  INTERVAL HISTORY: Nancy [place] 71 y.o. female returns to the clinic today for follow-up visit. The patient is feeling fine today was no specific complaints. She continues to tolerate her treatment with Nivolumab fairly well. She denied having any fever or chills. She has no nausea, vomiting, diarrhea or constipation. She denied having any chest pain, shortness breath, cough or hemoptysis. She has occasional low back pain with radiation to the right leg. Here today for evaluation before starting the next dose of her treatment with Nivolumab.  MEDICAL HISTORY: Past Medical History:  Diagnosis Date  . Allergic rhinitis   . Anxiety   . Arthritis   . Asthma   . Atrial flutter ([facility])   . Cholelithiases 09/26/2015  . Diverticulosis   . Encounter for antineoplastic immunotherapy 12/09/2016  . GERD (gastroesophageal reflux disease)   . H/O hiatal hernia   . History of kidney stones   . Internal hemorrhoid   . Lung  cancer ([facility])    a. Stage IB non-small cell carcinoma, s/p R VATS, wedge resection, RU lobectomy 10/2012.  [name] Other specified diabetes mellitus with hyperglycemia ([facility])    hx of chemo induced high blood sugar  . Paroxysmal atrial fibrillation ([facility])    a. anticoagulated with Xarelto  . Pneumonia 06/2016   [facility]  . Pulmonary nodule   . 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 s/p Nancy [place] Leadless pacemaker 09/2012.    ALLERGIES:  is allergic to prednisone; septra [sulfamethoxazole-trimethoprim]; penicillins; and vioxx [rofecoxib].  MEDICATIONS:  Current Outpatient Prescriptions  Medication Sig Dispense Refill  . acetaminophen (TYLENOL) 500 MG tablet Take 500 mg by mouth every 6 (six) hours as needed for moderate pain or fever.     [name] albuterol (PROVENTIL HFA;VENTOLIN HFA) 108 (90 BASE) MCG/ACT inhaler Inhale 1-2 puffs into the lungs every 6 (six) hours as needed for wheezing or shortness of breath.    . diltiazem (CARDIZEM CD) 240 MG 24 hr capsule Take 1 capsule (240 mg total) by mouth daily. 30 capsule 11  . fexofenadine (ALLEGRA) 180 MG tablet Take 180 mg by mouth daily.    . fluticasone (FLONASE) 50 MCG/ACT nasal spray Place 1 spray into both nostrils daily.  3  . hydroxypropyl methylcellulose / hypromellose (ISOPTO TEARS / GONIOVISC) 2.5 % ophthalmic solution Place 1-2 drops into the left eye as needed for dry eyes.    [name] ipratropium-albuterol (DUONEB) 0.5-2.5 (3) MG/3ML SOLN Take 3 mLs by nebulization every 6 (six) hours as needed (shortness of breath or wheezing).     [name]  meclizine (ANTIVERT) 12.5 MG tablet Take 12.5 mg by mouth 3 (three) times daily as needed for dizziness.    . Multiple Vitamin (MULTIVITAMIN) tablet Take 1 tablet by mouth daily.    . rivaroxaban (XARELTO) 20 MG TABS tablet Take 1 tablet (20 mg total) by mouth daily with supper. 30 tablet 11   No current facility-administered medications for this visit.     SURGICAL HISTORY:  Past Surgical History:    Procedure Laterality Date  . CATARACT EXTRACTION W/PHACO Right 08/14/2016   Procedure: CATARACT EXTRACTION PHACO AND INTRAOCULAR LENS PLACEMENT RIGHT EYE CDE 10.53;  Surgeon: Nancy Branch, MD;  Location: [facility];  Service: Ophthalmology;  Laterality: Right;  right  . CATARACT EXTRACTION W/PHACO Left 09/25/2016   Procedure: CATARACT EXTRACTION PHACO AND INTRAOCULAR LENS PLACEMENT (IOC);  Surgeon: Nancy Branch, MD;  Location: [facility];  Service: Ophthalmology;  Laterality: Left;  left CDE 8.81  . COLONOSCOPY     [facility]: cattered sigmoid diverticula, internal hemorrhoids, sessile polyp in ascending and mid transverse colon (tubular adenoma).   . colonoscopy with polypectomy  2015  . ESOPHAGOGASTRODUODENOSCOPY     [facility]: bile reflux in stomach, mild gastritis, hiatal hernia  . ESOPHAGOGASTRODUODENOSCOPY N/A 01/31/2016   Procedure: ESOPHAGOGASTRODUODENOSCOPY (EGD);  Surgeon: Nancy Binder, MD;  Location: [facility];  Service: Endoscopy;  Laterality: N/A;  1200  . LOBECTOMY Right 10/27/2012   Procedure: LOBECTOMY;  Surgeon: Nancy Nakayama, MD;  Location: [facility];  Service: Thoracic;  Laterality: Right;   RIGHT UPPER LOBECTOMY & Node Dissection  . LYMPH NODE BIOPSY Right 05/01/2014   Procedure: LYMPH NODE BIOPSY, Right Axillary;  Surgeon: Nancy Nakayama, MD;  Location: [facility];  Service: Thoracic;  Laterality: Right;  . PARTIAL HYSTERECTOMY    . PERMANENT PACEMAKER INSERTION N/A 09/14/2012   Nancy [facility] (SJM) leadless pacemaker (LEADLESS II STUDY PATIENT) implanted by Dr Nancy [facility]  . VIDEO ASSISTED THORACOSCOPY (VATS)/WEDGE RESECTION Right 10/27/2012   Procedure: VIDEO ASSISTED THORACOSCOPY (VATS),RGHT UPPER LOBE LUNG WEDGE RESECTION;  Surgeon: Nancy Nakayama, MD;  Location: [facility];  Service: Thoracic;  Laterality: Right;    REVIEW OF SYSTEMS:  A comprehensive review of systems was negative except for: Musculoskeletal: positive for back pain   PHYSICAL EXAMINATION: General appearance: alert,  cooperative and no distress Head: Normocephalic, without obvious abnormality, atraumatic Neck: no adenopathy, no JVD, supple, symmetrical, trachea midline and thyroid not enlarged, symmetric, no tenderness/mass/nodules Lymph nodes: Cervical, supraclavicular, and axillary nodes normal. Resp: clear to auscultation bilaterally Back: symmetric, no curvature. ROM normal. No CVA tenderness. Cardio: regular rate and rhythm, S1, S2 normal, no murmur, click, rub or gallop GI: soft, non-tender; bowel sounds normal; no masses,  no organomegaly Extremities: extremities normal, atraumatic, no cyanosis or edema  ECOG PERFORMANCE STATUS: 0 - Asymptomatic  Blood pressure 139/67, pulse (!) 115, temperature 98.3 F (36.8 C), temperature source Oral, resp. rate 18, height 5\' 2"  (1.575 m), weight 147 lb 11.2 oz (67 kg), SpO2 95 %.  LABORATORY DATA: Lab Results  Component Value Date   WBC 4.7 03/17/2017   HGB 14.1 03/17/2017   HCT 40.4 03/17/2017   MCV 97.5 03/17/2017   PLT 184 03/17/2017      Chemistry      Component Value Date/Time   NA 141 03/17/2017 0955   K 4.0 03/17/2017 0955   CL 111 02/19/2017 0458   CO2 24 03/17/2017 0955   BUN 10.0 03/17/2017 0955   CREATININE 0.8 03/17/2017 0955   GLU 134 (H) 02/18/2017  1410      Component Value Date/Time   CALCIUM 10.3 03/17/2017 0955   ALKPHOS 123 03/17/2017 0955   AST 37 (H) 03/17/2017 0955   ALT 31 03/17/2017 0955   BILITOT 1.26 (H) 03/17/2017 0955       RADIOGRAPHIC STUDIES: Ct Chest W Contrast  Result Date: 02/17/2017 CLINICAL DATA:  Non-small cell right lung cancer, stage IV, diagnosed 2013 status post right lobe wedge resection, chemotherapy, and XRT (completed 05/2016). Currently receiving chemotherapy. EXAM: CT CHEST, ABDOMEN, AND PELVIS WITH CONTRAST TECHNIQUE: Multidetector CT imaging of the chest, abdomen and pelvis was performed following the standard protocol during bolus administration of intravenous contrast. CONTRAST:  152mL  ISOVUE-300 IOPAMIDOL (ISOVUE-300) INJECTION 61% COMPARISON:  12/01/2016 FINDINGS: CT CHEST FINDINGS Cardiovascular: Heart is normal in size.  No pericardial effusion. Coronary atherosclerosis the LAD and left circumflex. No evidence of thoracic aortic aneurysm. Mild atherosclerotic calcifications of the aortic arch. Mediastinum/Nodes: No suspicious mediastinal, hilar, or axillary lymphadenopathy. Visualized thyroid is unremarkable. Lungs/Pleura: Status post right upper lobectomy. Near complete resolution of prior bilateral pulmonary nodules. Residual nodular scarring along the posterior aspect of the right middle lobe (series 6/ image 49). Residual nodular scarring in the posterior left upper lobe (series 6/image 72). Residual nodular scarring in the anterior right lower lobe (series 6/ image 90) and in the medial right lung base (series 6/image 105). Stable radiation changes in the medial right lower lobe (series 6/ image 52). Mild left apical pleural-parenchymal scarring. Mild subpleural scarring along the lateral right lower lobe. No focal consolidation. No pleural effusion or pneumothorax. Musculoskeletal: Mild degenerative changes of the thoracic spine. CT ABDOMEN PELVIS FINDINGS Hepatobiliary: Mildly nodular hepatic contour, suggesting cirrhosis. No focal hepatic lesion is seen. Layering gallstones (series 2/ image 61), without associated inflammatory changes. Pancreas: Within normal limits. Spleen: Spleen is mildly enlarged, measuring 15.1 cm in maximal axial dimension. 10 mm splenic cyst posteriorly (series 2/image 60). Adrenals/Urinary Tract: Adrenal glands are within normal limits. Kidneys are within normal limits.  No hydronephrosis. Bladder is within normal limits. Stomach/Bowel: Stomach is within normal limits. No evidence of bowel obstruction. Normal appendix (series 2/image 82). Mild sigmoid diverticulosis, without evidence of diverticulitis. Vascular/Lymphatic: No evidence of abdominal aortic  aneurysm. Atherosclerotic calcifications of the abdominal aorta and [place] vessels. Portal vein is patent. Recanalized periumbilical vein (series 2/image 59). Small perigastric varices. No suspicious abdominopelvic lymphadenopathy. Small upper abdominal lymph nodes measuring up to 7 mm short axis, within normal limits. Reproductive: Status post hysterectomy. Bilateral ovaries are within normal limits. Other: No abdominopelvic ascites. Musculoskeletal: Mild degenerative changes of the lumbar spine. IMPRESSION: Status post right upper lobectomy. Radiation changes in the medial right lower lobe. Prior bilateral pulmonary nodules/ metastases have essentially resolved, with minimal residual nodular scarring. No evidence of metastatic disease in the abdomen/pelvis. Cirrhosis with stigmata of portal hypertension. Portal vein is patent. Additional stable ancillary findings as above. Electronically Signed   By: Julian Hy M.D.   On: 02/17/2017 15:08   Ct Abdomen Pelvis W Contrast  Result Date: 02/17/2017 CLINICAL DATA:  Non-small cell right lung cancer, stage IV, diagnosed 2013 status post right lobe wedge resection, chemotherapy, and XRT (completed 05/2016). Currently receiving chemotherapy. EXAM: CT CHEST, ABDOMEN, AND PELVIS WITH CONTRAST TECHNIQUE: Multidetector CT imaging of the chest, abdomen and pelvis was performed following the standard protocol during bolus administration of intravenous contrast. CONTRAST:  165mL ISOVUE-300 IOPAMIDOL (ISOVUE-300) INJECTION 61% COMPARISON:  12/01/2016 FINDINGS: CT CHEST FINDINGS Cardiovascular: Heart is normal in size.  No  pericardial effusion. Coronary atherosclerosis the LAD and left circumflex. No evidence of thoracic aortic aneurysm. Mild atherosclerotic calcifications of the aortic arch. Mediastinum/Nodes: No suspicious mediastinal, hilar, or axillary lymphadenopathy. Visualized thyroid is unremarkable. Lungs/Pleura: Status post right upper lobectomy. Near complete  resolution of prior bilateral pulmonary nodules. Residual nodular scarring along the posterior aspect of the right middle lobe (series 6/ image 49). Residual nodular scarring in the posterior left upper lobe (series 6/image 72). Residual nodular scarring in the anterior right lower lobe (series 6/ image 90) and in the medial right lung base (series 6/image 105). Stable radiation changes in the medial right lower lobe (series 6/ image 52). Mild left apical pleural-parenchymal scarring. Mild subpleural scarring along the lateral right lower lobe. No focal consolidation. No pleural effusion or pneumothorax. Musculoskeletal: Mild degenerative changes of the thoracic spine. CT ABDOMEN PELVIS FINDINGS Hepatobiliary: Mildly nodular hepatic contour, suggesting cirrhosis. No focal hepatic lesion is seen. Layering gallstones (series 2/ image 61), without associated inflammatory changes. Pancreas: Within normal limits. Spleen: Spleen is mildly enlarged, measuring 15.1 cm in maximal axial dimension. 10 mm splenic cyst posteriorly (series 2/image 60). Adrenals/Urinary Tract: Adrenal glands are within normal limits. Kidneys are within normal limits.  No hydronephrosis. Bladder is within normal limits. Stomach/Bowel: Stomach is within normal limits. No evidence of bowel obstruction. Normal appendix (series 2/image 82). Mild sigmoid diverticulosis, without evidence of diverticulitis. Vascular/Lymphatic: No evidence of abdominal aortic aneurysm. Atherosclerotic calcifications of the abdominal aorta and Hodges vessels. Portal vein is patent. Recanalized periumbilical vein (series 2/image 59). Small perigastric varices. No suspicious abdominopelvic lymphadenopathy. Small upper abdominal lymph nodes measuring up to 7 mm short axis, within normal limits. Reproductive: Status post hysterectomy. Bilateral ovaries are within normal limits. Other: No abdominopelvic ascites. Musculoskeletal: Mild degenerative changes of the lumbar spine.  IMPRESSION: Status post right upper lobectomy. Radiation changes in the medial right lower lobe. Prior bilateral pulmonary nodules/ metastases have essentially resolved, with minimal residual nodular scarring. No evidence of metastatic disease in the abdomen/pelvis. Cirrhosis with stigmata of portal hypertension. Portal vein is patent. Additional stable ancillary findings as above. Electronically Signed   By: Julian Hy M.D.   On: 02/17/2017 15:08   Dg Chest Port 1 View  Result Date: 02/18/2017 CLINICAL DATA:  Nausea and vomiting EXAM: PORTABLE CHEST 1 VIEW COMPARISON:  06/14/2016 chest radiograph. FINDINGS: Stable cardiomediastinal silhouette with normal heart size. No pneumothorax. Surgical clips overlie the right axilla. Surgical sutures are seen in the medial right hemithorax with surgical clips overlying the right hilum, unchanged. Loop recorder overlies the lower heart. Stable chronic blunting of the right costophrenic angle compatible with pleural-parenchymal scarring. No pulmonary edema. Mild scarring versus atelectasis at the left costophrenic angle. No acute consolidative airspace disease. IMPRESSION: 1. Stable postsurgical changes in the right hemithorax. 2. Mild scarring versus atelectasis at the left costophrenic angle. Otherwise no active disease in the chest. Electronically Signed   By: Ilona Sorrel M.D.   On: 02/18/2017 18:07   ASSESSMENT AND PLAN:  This is a very pleasant 71 years old white female with metastatic non-small cell lung cancer, adenocarcinoma with no actionable mutations status post systemic chemotherapy with carboplatin and Alimta and she is currently undergoing treatment with second line immunotherapy with Nivolumab status post 6 cycles. The patient is tolerating the treatment well with no significant adverse effects. I recommended for the patient to continue her current treatment with Nivolumab but I will change the dose and frequency to 480 milligram IV every 4 weeks  because the patient drives more than one hour to [place] to the infusion center every 2 weeks. The patient agreed to the current plan. I will see her back for follow-up visit in 4 weeks for evaluation after repeating CT scan of the chest, abdomen and pelvis for restaging of her disease. She was advised to call immediately if she has any concerning symptoms in the interval. The patient voices understanding of current disease status and treatment options and is in agreement with the current care plan. All questions were answered. The patient knows to call the clinic with any problems, questions or concerns. We can certainly see the patient much sooner if necessary. I spent 10 minutes counseling the patient face to face. The total time spent in the appointment was 15 minutes.  Disclaimer: This note was dictated with voice recognition software. Similar sounding words can inadvertently be transcribed and may not be corrected upon review.

## 2017-03-17 NOTE — Progress Notes (Signed)
Opdivo stopped at 1410. Pt c/o lower back pain radiating towards back of legs to knees. Pain was slow to resolve, pt says it is her sciatic nerve/ pinched nerve that bothers her at times. Pt stood up, walked around, and sat on bench seat. Opdivo restarted 1413, pain began to lessen. Pt offered heat pack, refused.     At discharge back pain had resolved, no other complaints. Pt in stable condition.

## 2017-03-17 NOTE — Progress Notes (Signed)
ON PATHWAY REGIMEN - Non-Small Cell Lung  No Change  Continue With Treatment as Ordered.     A cycle is every 14 days:     Nivolumab   **Always confirm dose/schedule in your pharmacy ordering system**    Patient Characteristics: Stage IV Metastatic, Non Squamous, Second Line - Chemotherapy/Immunotherapy, PS = 0, 1, No Prior PD-1/PD-L1  Inhibitor and Immunotherapy Candidate AJCC T Category: T2a Current Disease Status: Distant Metastases AJCC N Category: N2 AJCC M Category: M1a AJCC 8 Stage Grouping: IVA Histology: Non Squamous Cell ROS1 Rearrangement Status: Quantity Not Sufficient T790M Mutation Status: Not Applicable - EGFR Mutation Negative/Unknown Other Mutations/Biomarkers: No Other Actionable Mutations PD-L1 Expression Status: Quantity Not Sufficient Chemotherapy/Immunotherapy LOT: Second Line Chemotherapy/Immunotherapy Molecular Targeted Therapy: Not Appropriate ALK Translocation Status: Quantity Not Sufficient Would you be surprised if this patient died  in the next year? I would NOT be surprised if this patient died in the next year EGFR Mutation Status: Quantity Not Sufficient BRAF V600E Mutation Status: Quantity Not Sufficient Performance Status: PS = 0, 1 Immunotherapy Candidate Status: Candidate for Immunotherapy Prior Immunotherapy Status: No Prior PD-1/PD-L1 Inhibitor Intent of Therapy: Non-Curative / Palliative Intent, Discussed with Patient

## 2017-03-17 NOTE — Telephone Encounter (Signed)
Scheduled appt per 8/14 los - Gave patient AVS and calender per los.  

## 2017-03-30 ENCOUNTER — Encounter: Payer: Self-pay | Admitting: Internal Medicine

## 2017-03-30 ENCOUNTER — Encounter (INDEPENDENT_AMBULATORY_CARE_PROVIDER_SITE_OTHER): Payer: Self-pay

## 2017-03-30 ENCOUNTER — Ambulatory Visit (INDEPENDENT_AMBULATORY_CARE_PROVIDER_SITE_OTHER): Payer: Medicare Other | Admitting: Internal Medicine

## 2017-03-30 ENCOUNTER — Other Ambulatory Visit: Payer: Self-pay | Admitting: Medical Oncology

## 2017-03-30 VITALS — BP 142/60 | HR 79 | Ht 62.0 in | Wt 149.2 lb

## 2017-03-30 DIAGNOSIS — I495 Sick sinus syndrome: Secondary | ICD-10-CM

## 2017-03-30 DIAGNOSIS — I480 Paroxysmal atrial fibrillation: Secondary | ICD-10-CM | POA: Diagnosis not present

## 2017-03-30 DIAGNOSIS — C3491 Malignant neoplasm of unspecified part of right bronchus or lung: Secondary | ICD-10-CM

## 2017-03-30 LAB — CUP PACEART INCLINIC DEVICE CHECK
Battery Voltage: 3.23 V
MDC IDC MSMT BATTERY REMAINING LONGEVITY: 175 mo
MDC IDC MSMT LEADCHNL RV PACING THRESHOLD AMPLITUDE: 0.5 V
MDC IDC MSMT LEADCHNL RV PACING THRESHOLD PULSEWIDTH: 0.4 ms
MDC IDC MSMT LEADCHNL RV SENSING INTR AMPL: 12 mV
MDC IDC PG IMPLANT DT: 20140211
MDC IDC SESS DTM: 20180827150137
Pulse Gen Serial Number: 1095

## 2017-03-30 MED ORDER — RIVAROXABAN 20 MG PO TABS: 20.0000 mg | ORAL_TABLET | Freq: Every day | ORAL | 11 refills | Status: DC

## 2017-03-30 MED ORDER — DILTIAZEM HCL ER COATED BEADS 240 MG PO CP24: 240.0000 mg | ORAL_CAPSULE | Freq: Every day | ORAL | 3 refills | Status: DC

## 2017-03-30 NOTE — Patient Instructions (Addendum)
Medication Instructions:  Your physician recommends that you continue on your current medications as directed. Please refer to the Current Medication list given to you today.   Labwork: None ordered   Testing/Procedures: None ordered   Follow-Up:  Your physician wants you to follow-up in: 6 months with Chanetta Marshall, NP and 12 months with Dr Vallery Ridge will receive a reminder letter in the mail two months in advance. If you don't receive a letter, please call our office to schedule the follow-up appointment.    Thank you for choosing [facility]!!     Janan Halter, RN [phone]

## 2017-03-30 NOTE — Progress Notes (Signed)
PCP: [name], PA-C  Primary EP: Dr Kathlee Nations is a 71 y.o. female who presents today for routine electrophysiology followup.  Since last being seen in our clinic, the patient reports doing reasonably well.  She was hospitalized in July (records reviewed) for sepsis.  Blood cultures were negative.  She has recovered.  Today, she denies symptoms of palpitations, chest pain, shortness of breath,  lower extremity edema, dizziness, presyncope, or syncope.  The patient is otherwise without complaint today.   Past Medical History:  Diagnosis Date  . Allergic rhinitis   . Anxiety   . Arthritis   . Asthma   . Atrial flutter ([place])   . Cholelithiases 09/26/2015  . Diverticulosis   . Encounter for antineoplastic immunotherapy 12/09/2016  . GERD (gastroesophageal reflux disease)   . H/O hiatal hernia   . History of kidney stones   . Internal hemorrhoid   . Lung cancer ([place])    a. Stage IB non-small cell carcinoma, s/p R VATS, wedge resection, RU lobectomy 10/2012.  [name] Other specified diabetes mellitus with hyperglycemia ([place])    hx of chemo induced high blood sugar  . Paroxysmal atrial fibrillation ([facility])    a. anticoagulated with Xarelto  . Pneumonia 06/2016   [facility]  . Pulmonary nodule   . Tachycardia-bradycardia syndrome ([place])    a. 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   Past Surgical History:  Procedure Laterality Date  . CATARACT EXTRACTION W/PHACO Right 08/14/2016   Procedure: CATARACT EXTRACTION PHACO AND INTRAOCULAR LENS PLACEMENT RIGHT EYE CDE 10.53;  Surgeon: Tonny Branch, MD;  Location: [facility];  Service: Ophthalmology;  Laterality: Right;  right  . CATARACT EXTRACTION W/PHACO Left 09/25/2016   Procedure: CATARACT EXTRACTION PHACO AND INTRAOCULAR LENS PLACEMENT (IOC);  Surgeon: Tonny Branch, MD;  Location: [facility];  Service: Ophthalmology;  Laterality: Left;  left CDE 8.81  . COLONOSCOPY     [facility]: cattered sigmoid diverticula, internal  hemorrhoids, sessile polyp in ascending and mid transverse colon (tubular adenoma).   . colonoscopy with polypectomy  2015  . ESOPHAGOGASTRODUODENOSCOPY     [facility]: bile reflux in stomach, mild gastritis, hiatal hernia  . ESOPHAGOGASTRODUODENOSCOPY N/A 01/31/2016   Procedure: ESOPHAGOGASTRODUODENOSCOPY (EGD);  Surgeon: [name], MD;  Location: [facility];  Service: Endoscopy;  Laterality: N/A;  1200  . LOBECTOMY Right 10/27/2012   Procedure: LOBECTOMY;  Surgeon: [name], MD;  Location: [facility];  Service: Thoracic;  Laterality: Right;   RIGHT UPPER LOBECTOMY & Node Dissection  . LYMPH NODE BIOPSY Right 05/01/2014   Procedure: LYMPH NODE BIOPSY, Right Axillary;  Surgeon: [name], MD;  Location: [facility];  Service: Thoracic;  Laterality: Right;  . PARTIAL HYSTERECTOMY    . PERMANENT PACEMAKER INSERTION N/A 09/14/2012   Nanostim (SJM) leadless pacemaker (LEADLESS II STUDY PATIENT) implanted by Dr [name]  . VIDEO ASSISTED THORACOSCOPY (VATS)/WEDGE RESECTION Right 10/27/2012   Procedure: VIDEO ASSISTED THORACOSCOPY (VATS),RGHT UPPER LOBE LUNG WEDGE RESECTION;  Surgeon: [name], MD;  Location: [facility];  Service: Thoracic;  Laterality: Right;    ROS- all systems are reviewed and negatives except as per HPI above  Current Outpatient Prescriptions  Medication Sig Dispense Refill  . acetaminophen (TYLENOL) 500 MG tablet Take 500 mg by mouth every 6 (six) hours as needed for moderate pain or fever.     [name] albuterol (PROVENTIL HFA;VENTOLIN HFA) 108 (90 BASE) MCG/ACT inhaler Inhale 1-2 puffs into the lungs every 6 (six) hours  as needed for wheezing or shortness of breath.    . diltiazem (CARDIZEM CD) 240 MG 24 hr capsule Take 1 capsule (240 mg total) by mouth daily. 30 capsule 11  . fluticasone (FLONASE) 50 MCG/ACT nasal spray Place 1 spray into both nostrils daily as needed for allergies.   3  . hydroxypropyl methylcellulose / hypromellose (ISOPTO TEARS / GONIOVISC)  2.5 % ophthalmic solution Place 1-2 drops into the left eye as needed for dry eyes.    [name] ipratropium-albuterol (DUONEB) 0.5-2.5 (3) MG/3ML SOLN Take 3 mLs by nebulization every 6 (six) hours as needed (shortness of breath or wheezing).     [name] loratadine (CLARITIN) 10 MG tablet Take 10 mg by mouth daily.    . meclizine (ANTIVERT) 12.5 MG tablet Take 12.5 mg by mouth 3 (three) times daily as needed for dizziness.    . Multiple Vitamin (MULTIVITAMIN) tablet Take 1 tablet by mouth daily.    . rivaroxaban (XARELTO) 20 MG TABS tablet Take 1 tablet (20 mg total) by mouth daily with supper. 30 tablet 11   No current facility-administered medications for this visit.     Physical Exam: Vitals:   03/30/17 1346  BP: (!) 142/60  Pulse: 79  SpO2: 97%  Weight: 149 lb 3.2 oz (67.7 kg)  Height: 5\' 2"  (1.575 m)   GEN- The patient is well appearing, alert and oriented x 3 today.   Head- normocephalic, atraumatic Eyes-  Sclera clear, conjunctiva pink Ears- hearing intact Oropharynx- clear Lungs- Clear to ausculation bilaterally, normal work of breathing Heart- Regular rate and rhythm, no murmurs, rubs or gallops, PMI not laterally displaced GI- soft, NT, ND, + BS Extremities- no clubbing, cyanosis, or edema  EKG tracing ordered today is personally reviewed and shows sinus rhythm 79 bpm, otherwise normal ekg Leadless pacemaker interrogation today is reviewed and normal  Assessment and Plan:  1. Tachy/brady syndrome Normal pacemaker function See Pace Art report No changes today  2. Paroxysmal atrial fibrillation Doing well On xarelto (chads2vasc score is 3) Consider sotalol or tikosyn if afib burden increases.  Follow-up with EP NP in 6 months I will see in a year  [name] MD, [facility] 03/30/2017 2:00 PM

## 2017-03-30 NOTE — Addendum Note (Signed)
Addended by: Janan Halter F on: 03/30/2017 02:30 PM   Modules accepted: Orders

## 2017-03-31 ENCOUNTER — Ambulatory Visit: Payer: Medicare Other

## 2017-03-31 ENCOUNTER — Ambulatory Visit: Payer: Medicare Other | Admitting: Internal Medicine

## 2017-03-31 ENCOUNTER — Other Ambulatory Visit: Payer: Medicare Other

## 2017-04-10 ENCOUNTER — Telehealth: Payer: Self-pay

## 2017-04-10 ENCOUNTER — Ambulatory Visit (HOSPITAL_COMMUNITY)
Admission: RE | Admit: 2017-04-10 | Discharge: 2017-04-10 | Disposition: A | Discharge status: Home / Self Care | Payer: Medicare Other | Source: Ambulatory Visit | Attending: Internal Medicine | Admitting: Internal Medicine

## 2017-04-10 ENCOUNTER — Other Ambulatory Visit: Payer: Self-pay | Admitting: Internal Medicine

## 2017-04-10 DIAGNOSIS — R918 Other nonspecific abnormal finding of lung field: Secondary | ICD-10-CM | POA: Insufficient documentation

## 2017-04-10 DIAGNOSIS — I2510 Atherosclerotic heart disease of native coronary artery without angina pectoris: Secondary | ICD-10-CM | POA: Diagnosis not present

## 2017-04-10 DIAGNOSIS — Z5112 Encounter for antineoplastic immunotherapy: Secondary | ICD-10-CM | POA: Diagnosis present

## 2017-04-10 DIAGNOSIS — R161 Splenomegaly, not elsewhere classified: Secondary | ICD-10-CM | POA: Insufficient documentation

## 2017-04-10 DIAGNOSIS — R591 Generalized enlarged lymph nodes: Secondary | ICD-10-CM | POA: Insufficient documentation

## 2017-04-10 DIAGNOSIS — C3491 Malignant neoplasm of unspecified part of right bronchus or lung: Secondary | ICD-10-CM | POA: Insufficient documentation

## 2017-04-10 DIAGNOSIS — K7460 Unspecified cirrhosis of liver: Secondary | ICD-10-CM | POA: Diagnosis not present

## 2017-04-10 DIAGNOSIS — K5730 Diverticulosis of large intestine without perforation or abscess without bleeding: Secondary | ICD-10-CM | POA: Insufficient documentation

## 2017-04-10 DIAGNOSIS — K8020 Calculus of gallbladder without cholecystitis without obstruction: Secondary | ICD-10-CM | POA: Diagnosis not present

## 2017-04-10 DIAGNOSIS — K449 Diaphragmatic hernia without obstruction or gangrene: Secondary | ICD-10-CM | POA: Diagnosis not present

## 2017-04-10 DIAGNOSIS — Z7189 Other specified counseling: Secondary | ICD-10-CM | POA: Insufficient documentation

## 2017-04-10 MED ORDER — IOPAMIDOL (ISOVUE-300) INJECTION 61%
30.0000 mL | Freq: Once | INTRAVENOUS | Status: AC | PRN
  Administered 2017-04-10: 30 mL via ORAL

## 2017-04-10 MED ORDER — IOPAMIDOL (ISOVUE-300) INJECTION 61%
INTRAVENOUS | Status: AC
  Filled 2017-04-10: qty 30

## 2017-04-10 MED ORDER — IOPAMIDOL (ISOVUE-300) INJECTION 61%
INTRAVENOUS | Status: AC
  Filled 2017-04-10: qty 100

## 2017-04-10 MED ORDER — IOPAMIDOL (ISOVUE-300) INJECTION 61%
100.0000 mL | Freq: Once | INTRAVENOUS | Status: AC | PRN
  Administered 2017-04-10: 100 mL via INTRAVENOUS

## 2017-04-10 NOTE — Telephone Encounter (Signed)
CT called. Pt was originally ordered CT abd pelvis with and without contrast. They called for correction and CT abdomen with contrast was ordered. They were asking if the pelvis also needed scanning. Reordered again with CT abdomen and pelvis with contrast.   Got PA from [facility] and let CT know.

## 2017-04-14 ENCOUNTER — Ambulatory Visit (HOSPITAL_BASED_OUTPATIENT_CLINIC_OR_DEPARTMENT_OTHER): Payer: Medicare Other | Admitting: Internal Medicine

## 2017-04-14 ENCOUNTER — Ambulatory Visit (HOSPITAL_BASED_OUTPATIENT_CLINIC_OR_DEPARTMENT_OTHER): Payer: Medicare Other

## 2017-04-14 ENCOUNTER — Encounter: Payer: Self-pay | Admitting: Internal Medicine

## 2017-04-14 ENCOUNTER — Other Ambulatory Visit (HOSPITAL_BASED_OUTPATIENT_CLINIC_OR_DEPARTMENT_OTHER): Payer: Medicare Other

## 2017-04-14 VITALS — BP 160/65 | HR 118 | Temp 97.9°F | Resp 18 | Ht 62.0 in | Wt 147.6 lb

## 2017-04-14 DIAGNOSIS — C3491 Malignant neoplasm of unspecified part of right bronchus or lung: Secondary | ICD-10-CM

## 2017-04-14 DIAGNOSIS — Z79899 Other long term (current) drug therapy: Secondary | ICD-10-CM

## 2017-04-14 DIAGNOSIS — Z5112 Encounter for antineoplastic immunotherapy: Secondary | ICD-10-CM

## 2017-04-14 DIAGNOSIS — I10 Essential (primary) hypertension: Secondary | ICD-10-CM

## 2017-04-14 DIAGNOSIS — C773 Secondary and unspecified malignant neoplasm of axilla and upper limb lymph nodes: Secondary | ICD-10-CM

## 2017-04-14 DIAGNOSIS — R5382 Chronic fatigue, unspecified: Secondary | ICD-10-CM

## 2017-04-14 DIAGNOSIS — C3411 Malignant neoplasm of upper lobe, right bronchus or lung: Secondary | ICD-10-CM

## 2017-04-14 LAB — CBC WITH DIFFERENTIAL/PLATELET
BASO%: 0.6 % (ref 0.0–2.0)
BASOS ABS: 0 10*3/uL (ref 0.0–0.1)
EOS ABS: 0.1 10*3/uL (ref 0.0–0.5)
EOS%: 1.6 % (ref 0.0–7.0)
HEMATOCRIT: 38.6 % (ref 34.8–46.6)
HEMOGLOBIN: 13.7 g/dL (ref 11.6–15.9)
LYMPH#: 1.2 10*3/uL (ref 0.9–3.3)
LYMPH%: 24 % (ref 14.0–49.7)
MCH: 34.2 pg — AB (ref 25.1–34.0)
MCHC: 35.5 g/dL (ref 31.5–36.0)
MCV: 96.3 fL (ref 79.5–101.0)
MONO#: 0.4 10*3/uL (ref 0.1–0.9)
MONO%: 7.2 % (ref 0.0–14.0)
NEUT%: 66.6 % (ref 38.4–76.8)
NEUTROS ABS: 3.2 10*3/uL (ref 1.5–6.5)
Platelets: 148 10*3/uL (ref 145–400)
RBC: 4.01 10*6/uL (ref 3.70–5.45)
RDW: 13.1 % (ref 11.2–14.5)
WBC: 4.9 10*3/uL (ref 3.9–10.3)

## 2017-04-14 LAB — COMPREHENSIVE METABOLIC PANEL
ALBUMIN: 4.1 g/dL (ref 3.5–5.0)
ALK PHOS: 117 U/L (ref 40–150)
ALT: 31 U/L (ref 0–55)
AST: 41 U/L — AB (ref 5–34)
Anion Gap: 8 mEq/L (ref 3–11)
BILIRUBIN TOTAL: 1.15 mg/dL (ref 0.20–1.20)
BUN: 9.8 mg/dL (ref 7.0–26.0)
CALCIUM: 10.2 mg/dL (ref 8.4–10.4)
CO2: 25 mEq/L (ref 22–29)
Chloride: 107 mEq/L (ref 98–109)
Creatinine: 0.8 mg/dL (ref 0.6–1.1)
EGFR: 76 mL/min/{1.73_m2} — ABNORMAL LOW (ref >90)
GLUCOSE: 115 mg/dL (ref 70–140)
POTASSIUM: 4.2 meq/L (ref 3.5–5.1)
SODIUM: 140 meq/L (ref 136–145)
TOTAL PROTEIN: 7.5 g/dL (ref 6.4–8.3)

## 2017-04-14 LAB — TSH: TSH: 2.063 m(IU)/L (ref 0.308–3.960)

## 2017-04-14 MED ORDER — NIVOLUMAB CHEMO INJECTION 100 MG/10ML
480.0000 mg | Freq: Once | INTRAVENOUS | Status: AC
  Administered 2017-04-14: 480 mg via INTRAVENOUS
  Filled 2017-04-14: qty 48

## 2017-04-14 MED ORDER — SODIUM CHLORIDE 0.9 % IV SOLN
Freq: Once | INTRAVENOUS | Status: AC
  Administered 2017-04-14: 14:00:00 via INTRAVENOUS

## 2017-04-14 NOTE — Progress Notes (Signed)
[facility] [phone]) [phone]   [phone]  OFFICE PROGRESS NOTE  [name] [address] [place] [address]  DIAGNOSIS: Metastatic non-small cell lung cancer, adenocarcinoma. This was initially diagnosed as stage IB (T2a, N0, M0) in March of 2014, status post right upper lobectomy with lymph node dissection but the patient has evidence for disease recurrence in the right axilla status post resection.  PRIOR THERAPY:  1) Status post right axillary lymph node biopsy on 05/01/2014 under the care of Dr. Roxan Hockey. 2) Systemic chemotherapy with carboplatin for AUC of 5 and Alimta 500 mg/M2 every 3 weeks. First cycle 05/31/2014. Status post 6 cycles. Last dose was given 09/22/2014. 3) palliative radiotherapy to the subcarinal lymph node under the care of Dr. Sondra Come. 4) Nivolumab 240 mg IV every 2 weeks. First dose 12/23/2016. Status post 6 cycles.  CURRENT THERAPY:  Nivolumab 480 mg IV every 4 weeks. First dose 03/17/2017. Status post one cycle.  INTERVAL HISTORY: Nancy Hodges 71 y.o. female returns to the clinic today for follow-up visit. The patient tolerated the first cycle of the high-dose Nivolumab every 4 weeks fairly well. She denied having any nausea or vomiting. She denied having any skin rash or diarrhea. She has no fever or chills. She has no significant chest pain, shortness of breath, cough or hemoptysis. She was a little bit upset with the long wait at the at the lobby of the [facility]. She had repeat CT scan of the chest, abdomen and pelvis performed recently and she is here for evaluation and discussion of her scan results.  MEDICAL HISTORY: Past Medical History:  Diagnosis Date  . Allergic rhinitis   . Anxiety   . Arthritis   . Asthma   . Atrial flutter ([facility])   . Cholelithiases 09/26/2015  . Diverticulosis   . Encounter for antineoplastic immunotherapy 12/09/2016  . GERD (gastroesophageal reflux disease)   .  H/O hiatal hernia   . History of kidney stones   . Internal hemorrhoid   . Lung cancer ([facility])    a. Stage IB non-small cell carcinoma, s/p R VATS, wedge resection, RU lobectomy 10/2012.  [name] Other specified diabetes mellitus with hyperglycemia ([facility])    hx of chemo induced high blood sugar  . Paroxysmal atrial fibrillation ([facility])    a. anticoagulated with Xarelto  . Pneumonia 06/2016   [facility]  . Pulmonary nodule   . 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    ALLERGIES:  is allergic to prednisone; septra [sulfamethoxazole-trimethoprim]; penicillins; and vioxx [rofecoxib].  MEDICATIONS:  Current Outpatient Prescriptions  Medication Sig Dispense Refill  . acetaminophen (TYLENOL) 500 MG tablet Take 500 mg by mouth every 6 (six) hours as needed for moderate pain or fever.     [name] albuterol (PROVENTIL HFA;VENTOLIN HFA) 108 (90 BASE) MCG/ACT inhaler Inhale 1-2 puffs into the lungs every 6 (six) hours as needed for wheezing or shortness of breath.    . diltiazem (CARDIZEM CD) 240 MG 24 hr capsule Take 1 capsule (240 mg total) by mouth daily. 90 capsule 3  . fluticasone (FLONASE) 50 MCG/ACT nasal spray Place 1 spray into both nostrils daily as needed for allergies.   3  . hydroxypropyl methylcellulose / hypromellose (ISOPTO TEARS / GONIOVISC) 2.5 % ophthalmic solution Place 1-2 drops into the left eye as needed for dry eyes.    [name] ipratropium-albuterol (DUONEB) 0.5-2.5 (3) MG/3ML SOLN Take 3  mLs by nebulization every 6 (six) hours as needed (shortness of breath or wheezing).     [name] loratadine (CLARITIN) 10 MG tablet Take 10 mg by mouth daily.    . meclizine (ANTIVERT) 12.5 MG tablet Take 12.5 mg by mouth 3 (three) times daily as needed for dizziness.    . Multiple Vitamin (MULTIVITAMIN) tablet Take 1 tablet by mouth daily.    . rivaroxaban (XARELTO) 20 MG TABS tablet Take 1 tablet (20 mg total) by mouth daily with supper. 30 tablet 11   No current  facility-administered medications for this visit.     SURGICAL HISTORY:  Past Surgical History:  Procedure Laterality Date  . CATARACT EXTRACTION W/PHACO Right 08/14/2016   Procedure: CATARACT EXTRACTION PHACO AND INTRAOCULAR LENS PLACEMENT RIGHT EYE CDE 10.53;  Surgeon: Tonny Branch, MD;  Location: [facility];  Service: Ophthalmology;  Laterality: Right;  right  . CATARACT EXTRACTION W/PHACO Left 09/25/2016   Procedure: CATARACT EXTRACTION PHACO AND INTRAOCULAR LENS PLACEMENT (IOC);  Surgeon: Tonny Branch, MD;  Location: [facility];  Service: Ophthalmology;  Laterality: Left;  left CDE 8.81  . COLONOSCOPY     [facility]: cattered sigmoid diverticula, internal hemorrhoids, sessile polyp in ascending and mid transverse colon (tubular adenoma).   . colonoscopy with polypectomy  2015  . ESOPHAGOGASTRODUODENOSCOPY     [facility]: bile reflux in stomach, mild gastritis, hiatal hernia  . ESOPHAGOGASTRODUODENOSCOPY N/A 01/31/2016   Procedure: ESOPHAGOGASTRODUODENOSCOPY (EGD);  Surgeon: [name], MD;  Location: [facility];  Service: Endoscopy;  Laterality: N/A;  1200  . LOBECTOMY Right 10/27/2012   Procedure: LOBECTOMY;  Surgeon: [name], MD;  Location: [facility];  Service: Thoracic;  Laterality: Right;   RIGHT UPPER LOBECTOMY & Node Dissection  . LYMPH NODE BIOPSY Right 05/01/2014   Procedure: LYMPH NODE BIOPSY, Right Axillary;  Surgeon: [name], MD;  Location: [facility];  Service: Thoracic;  Laterality: Right;  . PARTIAL HYSTERECTOMY    . PERMANENT PACEMAKER INSERTION N/A 09/14/2012   Nanostim (SJM) leadless pacemaker (LEADLESS II STUDY PATIENT) implanted by Dr [name]  . VIDEO ASSISTED THORACOSCOPY (VATS)/WEDGE RESECTION Right 10/27/2012   Procedure: VIDEO ASSISTED THORACOSCOPY (VATS),RGHT UPPER LOBE LUNG WEDGE RESECTION;  Surgeon: [name], MD;  Location: [facility];  Service: Thoracic;  Laterality: Right;    REVIEW OF SYSTEMS:  Constitutional: negative Eyes: negative Ears, nose,  mouth, throat, and face: negative Respiratory: negative Cardiovascular: negative Gastrointestinal: negative Genitourinary:negative Integument/breast: negative Hematologic/lymphatic: negative Musculoskeletal:negative Neurological: negative Behavioral/Psych: negative Endocrine: negative Allergic/Immunologic: negative   PHYSICAL EXAMINATION: General appearance: alert, cooperative and no distress Head: Normocephalic, without obvious abnormality, atraumatic Neck: no adenopathy, no JVD, supple, symmetrical, trachea midline and thyroid not enlarged, symmetric, no tenderness/mass/nodules Lymph nodes: Cervical, supraclavicular, and axillary nodes normal. Resp: clear to auscultation bilaterally Back: symmetric, no curvature. ROM normal. No CVA tenderness. Cardio: regular rate and rhythm, S1, S2 normal, no murmur, click, rub or gallop GI: soft, non-tender; bowel sounds normal; no masses,  no organomegaly Extremities: extremities normal, atraumatic, no cyanosis or edema Neurologic: Alert and oriented X 3, normal strength and tone. Normal symmetric reflexes. Normal coordination and gait  ECOG PERFORMANCE STATUS: 0 - Asymptomatic  Blood pressure (!) 160/65, pulse (!) 118, temperature 97.9 F (36.6 C), temperature source Oral, resp. rate 18, height 5\' 2"  (1.575 m), weight 147 lb 9.6 oz (67 kg), SpO2 97 %.  LABORATORY DATA: Lab Results  Component Value Date   WBC 4.9 04/14/2017   HGB 13.7 04/14/2017   HCT  38.6 04/14/2017   MCV 96.3 04/14/2017   PLT 148 04/14/2017      Chemistry      Component Value Date/Time   NA 141 03/17/2017 0955   K 4.0 03/17/2017 0955   CL 111 02/19/2017 0458   CO2 24 03/17/2017 0955   BUN 10.0 03/17/2017 0955   CREATININE 0.8 03/17/2017 0955   GLU 134 (H) 02/18/2017 1410      Component Value Date/Time   CALCIUM 10.3 03/17/2017 0955   ALKPHOS 123 03/17/2017 0955   AST 37 (H) 03/17/2017 0955   ALT 31 03/17/2017 0955   BILITOT 1.26 (H) 03/17/2017 0955        RADIOGRAPHIC STUDIES: Ct Chest W Contrast  Result Date: 04/10/2017 CLINICAL DATA:  Stage IV oligometastatic non-small cell right upper lobe lung adenocarcinoma diagnosed 2014 status post right upper lobectomy, with right axillary nodal recurrence in September 2015 treated with chemotherapy, with palliative radiotherapy to a subcarinal lymph node, with ongoing immunotherapy. EXAM: CT CHEST, ABDOMEN, AND PELVIS WITH CONTRAST TECHNIQUE: Multidetector CT imaging of the chest, abdomen and pelvis was performed following the standard protocol during bolus administration of intravenous contrast. CONTRAST:  18mL ISOVUE-300 IOPAMIDOL (ISOVUE-300) INJECTION 61%, 160mL ISOVUE-300 IOPAMIDOL (ISOVUE-300) INJECTION 61% COMPARISON:  02/17/2017 CT chest, abdomen and pelvis. FINDINGS: CT CHEST FINDINGS Cardiovascular: Normal heart size. No significant pericardial fluid/thickening. Left anterior descending and left circumflex coronary atherosclerosis. Atherosclerotic nonaneurysmal thoracic aorta. Normal caliber pulmonary arteries. No central pulmonary emboli. Mediastinum/Nodes: No discrete thyroid nodules. Unremarkable esophagus. Surgical clips are again noted in the right axilla. No pathologically enlarged axillary, mediastinal or hilar lymph nodes. Asymmetrically prominent but nonenlarged 0.4 cm right internal mammary node is stable (series 2/ image 22). Treated subcarinal 0.5 cm node (series 2/ image 25) is not enlarged and stable. Lungs/Pleura: No pneumothorax. No pleural effusion. Status post right upper lobectomy. Stable sharply marginated opacity in the medial right lower lobe compatible with mild radiation fibrosis. No acute consolidative airspace disease or lung masses. A few tiny scattered pulmonary nodules in both lungs measuring up to the 4 mm in the medial basilar right lower lobe (series 4/ image 109) and 3 mm in the posterior left lower lobe (series 4/ image 62) are all stable. No new significant pulmonary  nodules. Mild centrilobular emphysema with mild diffuse bronchial wall thickening. Musculoskeletal: No aggressive appearing focal osseous lesions. Mild thoracic spondylosis. CT ABDOMEN PELVIS FINDINGS Hepatobiliary: Liver surface is diffusely irregular and there is relative hypertrophy of the lateral segment left liver lobe, compatible with cirrhosis, unchanged. There are 2 small vague foci of hyperenhancement in the superior liver, largest 1.4 cm (series 2/ image 44), not appreciably changed. No new liver lesions. Cholelithiasis. No gallbladder wall thickening or pericholecystic fluid. No biliary ductal dilatation. Pancreas: Normal, with no mass or duct dilation. Spleen: Mildly enlarged spleen is stable. Stable hypodense 1.0 cm inferior splenic lesion. No new splenic lesions. Adrenals/Urinary Tract: Normal adrenals. No hydronephrosis. Subcentimeter hypodense renal cortical lesions in the lower left kidney are too small to characterize and appear stable. No new renal lesions. Normal bladder. Stomach/Bowel: Small hiatal hernia. Otherwise grossly normal stomach. Normal caliber small bowel with no small bowel wall thickening. Normal appendix . Mild diverticulosis of the descending and sigmoid colon. No large bowel wall thickening or pericolonic fat stranding. Oral contrast reaches the distal rectum. Vascular/Lymphatic: Atherosclerotic nonaneurysmal abdominal aorta. Patent portal, splenic, hepatic and renal veins. Stable mildly enlarged 1.1 cm porta hepatis nodes (series 2/ image 61 and 59). No new pathologically  enlarged abdominopelvic nodes. Reproductive: Status post hysterectomy, with no abnormal findings at the vaginal cuff. No adnexal mass. Other: No pneumoperitoneum, ascites or focal fluid collection. Musculoskeletal: No aggressive appearing focal osseous lesions. Mild lumbar spondylosis. IMPRESSION: 1. No new or progressive metastatic disease in the chest, abdomen or pelvis. Tiny scattered bilateral pulmonary  nodules are stable. No recurrent thoracic adenopathy. 2. Cirrhosis. Two small vague foci of hyperenhancement in the superior liver are stable and indeterminate. Technically, evaluation of these lesions and cirrhotic liver surveillance would be best accomplished with MRI abdomen without and with IV contrast, as clinically warranted. 3. Stable mild porta hepatis lymphadenopathy, probably reactive . 4. Chronic findings include Aortic Atherosclerosis (ICD10-I70.0) and Emphysema (ICD10-J43.9), two-vessel coronary atherosclerosis, cholelithiasis, mild splenomegaly, small hiatal hernia and left colonic diverticulosis. Electronically Signed   By: Ilona Sorrel M.D.   On: 04/10/2017 15:41   Ct Abdomen Pelvis W Contrast  Result Date: 04/10/2017 CLINICAL DATA:  Stage IV oligometastatic non-small cell right upper lobe lung adenocarcinoma diagnosed 2014 status post right upper lobectomy, with right axillary nodal recurrence in September 2015 treated with chemotherapy, with palliative radiotherapy to a subcarinal lymph node, with ongoing immunotherapy. EXAM: CT CHEST, ABDOMEN, AND PELVIS WITH CONTRAST TECHNIQUE: Multidetector CT imaging of the chest, abdomen and pelvis was performed following the standard protocol during bolus administration of intravenous contrast. CONTRAST:  39mL ISOVUE-300 IOPAMIDOL (ISOVUE-300) INJECTION 61%, 113mL ISOVUE-300 IOPAMIDOL (ISOVUE-300) INJECTION 61% COMPARISON:  02/17/2017 CT chest, abdomen and pelvis. FINDINGS: CT CHEST FINDINGS Cardiovascular: Normal heart size. No significant pericardial fluid/thickening. Left anterior descending and left circumflex coronary atherosclerosis. Atherosclerotic nonaneurysmal thoracic aorta. Normal caliber pulmonary arteries. No central pulmonary emboli. Mediastinum/Nodes: No discrete thyroid nodules. Unremarkable esophagus. Surgical clips are again noted in the right axilla. No pathologically enlarged axillary, mediastinal or hilar lymph nodes. Asymmetrically  prominent but nonenlarged 0.4 cm right internal mammary node is stable (series 2/ image 22). Treated subcarinal 0.5 cm node (series 2/ image 25) is not enlarged and stable. Lungs/Pleura: No pneumothorax. No pleural effusion. Status post right upper lobectomy. Stable sharply marginated opacity in the medial right lower lobe compatible with mild radiation fibrosis. No acute consolidative airspace disease or lung masses. A few tiny scattered pulmonary nodules in both lungs measuring up to the 4 mm in the medial basilar right lower lobe (series 4/ image 109) and 3 mm in the posterior left lower lobe (series 4/ image 62) are all stable. No new significant pulmonary nodules. Mild centrilobular emphysema with mild diffuse bronchial wall thickening. Musculoskeletal: No aggressive appearing focal osseous lesions. Mild thoracic spondylosis. CT ABDOMEN PELVIS FINDINGS Hepatobiliary: Liver surface is diffusely irregular and there is relative hypertrophy of the lateral segment left liver lobe, compatible with cirrhosis, unchanged. There are 2 small vague foci of hyperenhancement in the superior liver, largest 1.4 cm (series 2/ image 44), not appreciably changed. No new liver lesions. Cholelithiasis. No gallbladder wall thickening or pericholecystic fluid. No biliary ductal dilatation. Pancreas: Normal, with no mass or duct dilation. Spleen: Mildly enlarged spleen is stable. Stable hypodense 1.0 cm inferior splenic lesion. No new splenic lesions. Adrenals/Urinary Tract: Normal adrenals. No hydronephrosis. Subcentimeter hypodense renal cortical lesions in the lower left kidney are too small to characterize and appear stable. No new renal lesions. Normal bladder. Stomach/Bowel: Small hiatal hernia. Otherwise grossly normal stomach. Normal caliber small bowel with no small bowel wall thickening. Normal appendix . Mild diverticulosis of the descending and sigmoid colon. No large bowel wall thickening or pericolonic fat stranding.  Oral  contrast reaches the distal rectum. Vascular/Lymphatic: Atherosclerotic nonaneurysmal abdominal aorta. Patent portal, splenic, hepatic and renal veins. Stable mildly enlarged 1.1 cm porta hepatis nodes (series 2/ image 61 and 59). No new pathologically enlarged abdominopelvic nodes. Reproductive: Status post hysterectomy, with no abnormal findings at the vaginal cuff. No adnexal mass. Other: No pneumoperitoneum, ascites or focal fluid collection. Musculoskeletal: No aggressive appearing focal osseous lesions. Mild lumbar spondylosis. IMPRESSION: 1. No new or progressive metastatic disease in the chest, abdomen or pelvis. Tiny scattered bilateral pulmonary nodules are stable. No recurrent thoracic adenopathy. 2. Cirrhosis. Two small vague foci of hyperenhancement in the superior liver are stable and indeterminate. Technically, evaluation of these lesions and cirrhotic liver surveillance would be best accomplished with MRI abdomen without and with IV contrast, as clinically warranted. 3. Stable mild porta hepatis lymphadenopathy, probably reactive . 4. Chronic findings include Aortic Atherosclerosis (ICD10-I70.0) and Emphysema (ICD10-J43.9), two-vessel coronary atherosclerosis, cholelithiasis, mild splenomegaly, small hiatal hernia and left colonic diverticulosis. Electronically Signed   By: Ilona Sorrel M.D.   On: 04/10/2017 15:41   ASSESSMENT AND PLAN:  This is a very pleasant 71 years old white female with metastatic non-small cell lung cancer, adenocarcinoma with no actionable mutations status post systemic chemotherapy with carboplatin and Alimta and she is currently undergoing treatment with second line immunotherapy with Nivolumab status post 6 cycles. This was followed by immunotherapy with Nivolumab 480 mg IV every 4 weeks status post 1 cycle. She tolerated the first cycle of this treatment fairly well. She had repeat CT scan of the chest, abdomen and pelvis performed recently. I personally and  independently reviewed the scans and discuss the results with the patient today. Her scan showed no evidence for disease progression. I recommended for the patient to continue her current treatment with Nivolumab 480 MG IV every 4 weeks and she will proceed with cycle #2 today. For hypertension, she was a little bit upset with the registration process in her blood pressure was high today. I recommended for her to monitor it closely at home and to reconsult with her primary care physician if elevated. I will see her back for follow-up visit in 4 weeks with the next cycle of her treatment. She was advised to call immediately if she has any concerning symptoms in the interval. The patient voices understanding of current disease status and treatment options and is in agreement with the current care plan. All questions were answered. The patient knows to call the clinic with any problems, questions or concerns. We can certainly see the patient much sooner if necessary.  Disclaimer: This note was dictated with voice recognition software. Similar sounding words can inadvertently be transcribed and may not be corrected upon review.

## 2017-04-14 NOTE — Patient Instructions (Signed)
Nivolumab injection What is this medicine? NIVOLUMAB (nye VOL ue mab) is a monoclonal antibody. It is used to treat melanoma, lung cancer, kidney cancer, head and neck cancer, Hodgkin lymphoma, urothelial cancer, colon cancer, and liver cancer. This medicine may be used for other purposes; ask your health care provider or pharmacist if you have questions. COMMON BRAND NAME(S): Opdivo What should I tell my health care provider before I take this medicine? They need to know if you have any of these conditions: -diabetes -immune system problems -kidney disease -liver disease -lung disease -organ transplant -stomach or intestine problems -thyroid disease -an unusual or allergic reaction to nivolumab, other medicines, foods, dyes, or preservatives -pregnant or trying to get pregnant -breast-feeding How should I use this medicine? This medicine is for infusion into a vein. It is given by a health care professional in a hospital or clinic setting. A special MedGuide will be given to you before each treatment. Be sure to read this information carefully each time. Talk to your pediatrician regarding the use of this medicine in children. While this drug may be prescribed for children as young as 12 years for selected conditions, precautions do apply. Overdosage: If you think you have taken too much of this medicine contact a poison control center or emergency room at once. NOTE: This medicine is only for you. Do not share this medicine with others. What if I miss a dose? It is important not to miss your dose. Call your doctor or health care professional if you are unable to keep an appointment. What may interact with this medicine? Interactions have not been studied. Give your health care provider a list of all the medicines, herbs, non-prescription drugs, or dietary supplements you use. Also tell them if you smoke, drink alcohol, or use illegal drugs. Some items may interact with your  medicine. This list may not describe all possible interactions. Give your health care provider a list of all the medicines, herbs, non-prescription drugs, or dietary supplements you use. Also tell them if you smoke, drink alcohol, or use illegal drugs. Some items may interact with your medicine. What should I watch for while using this medicine? This drug may make you feel generally unwell. Continue your course of treatment even though you feel ill unless your doctor tells you to stop. You may need blood work done while you are taking this medicine. Do not become pregnant while taking this medicine or for 5 months after stopping it. Women should inform their doctor if they wish to become pregnant or think they might be pregnant. There is a potential for serious side effects to an unborn child. Talk to your health care professional or pharmacist for more information. Do not breast-feed an infant while taking this medicine. What side effects may I notice from receiving this medicine? Side effects that you should report to your doctor or health care professional as soon as possible: -allergic reactions like skin rash, itching or hives, swelling of the face, lips, or tongue -black, tarry stools -blood in the urine -bloody or watery diarrhea -changes in vision -change in sex drive -changes in emotions or moods -chest pain -confusion -cough -decreased appetite -diarrhea -facial flushing -feeling faint or lightheaded -fever, chills -hair loss -hallucination, loss of contact with reality -headache -irritable -joint pain -loss of memory -muscle pain -muscle weakness -seizures -shortness of breath -signs and symptoms of high blood sugar such as dizziness; dry mouth; dry skin; fruity breath; nausea; stomach pain; increased hunger or thirst; increased   urination -signs and symptoms of kidney injury like trouble passing urine or change in the amount of urine -signs and symptoms of liver injury  like dark yellow or brown urine; general ill feeling or flu-like symptoms; light-colored stools; loss of appetite; nausea; right upper belly pain; unusually weak or tired; yellowing of the eyes or skin -stiff neck -swelling of the ankles, feet, hands -weight gain Side effects that usually do not require medical attention (report to your doctor or health care professional if they continue or are bothersome): -bone pain -constipation -tiredness -vomiting This list may not describe all possible side effects. Call your doctor for medical advice about side effects. You may report side effects to FDA at 1-800-FDA-1088. Where should I keep my medicine? This drug is given in a hospital or clinic and will not be stored at home. NOTE: This sheet is a summary. It may not cover all possible information. If you have questions about this medicine, talk to your doctor, pharmacist, or health care provider.  2018 Elsevier/Gold Standard (2016-04-28 17:49:34)  

## 2017-04-16 ENCOUNTER — Telehealth: Payer: Self-pay | Admitting: *Deleted

## 2017-04-16 ENCOUNTER — Telehealth: Payer: Self-pay | Admitting: Internal Medicine

## 2017-04-16 NOTE — Telephone Encounter (Signed)
Scheduled appt per 9/11 los - patient to get new schedule next treatment day .
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
## 2017-05-11 ENCOUNTER — Other Ambulatory Visit: Payer: Self-pay | Admitting: Internal Medicine

## 2017-05-12 ENCOUNTER — Encounter: Payer: Self-pay | Admitting: Oncology

## 2017-05-12 ENCOUNTER — Other Ambulatory Visit (HOSPITAL_BASED_OUTPATIENT_CLINIC_OR_DEPARTMENT_OTHER): Payer: Medicare Other

## 2017-05-12 ENCOUNTER — Ambulatory Visit (HOSPITAL_BASED_OUTPATIENT_CLINIC_OR_DEPARTMENT_OTHER): Payer: Medicare Other

## 2017-05-12 ENCOUNTER — Ambulatory Visit (HOSPITAL_BASED_OUTPATIENT_CLINIC_OR_DEPARTMENT_OTHER): Payer: Medicare Other | Admitting: Oncology

## 2017-05-12 VITALS — BP 138/50 | HR 70 | Temp 98.0°F | Resp 18 | Ht 62.0 in | Wt 148.4 lb

## 2017-05-12 DIAGNOSIS — Z5112 Encounter for antineoplastic immunotherapy: Secondary | ICD-10-CM

## 2017-05-12 DIAGNOSIS — Z79899 Other long term (current) drug therapy: Secondary | ICD-10-CM | POA: Diagnosis not present

## 2017-05-12 DIAGNOSIS — C3411 Malignant neoplasm of upper lobe, right bronchus or lung: Secondary | ICD-10-CM

## 2017-05-12 DIAGNOSIS — C773 Secondary and unspecified malignant neoplasm of axilla and upper limb lymph nodes: Secondary | ICD-10-CM

## 2017-05-12 DIAGNOSIS — R5382 Chronic fatigue, unspecified: Secondary | ICD-10-CM

## 2017-05-12 DIAGNOSIS — C3491 Malignant neoplasm of unspecified part of right bronchus or lung: Secondary | ICD-10-CM

## 2017-05-12 DIAGNOSIS — L821 Other seborrheic keratosis: Secondary | ICD-10-CM | POA: Diagnosis not present

## 2017-05-12 LAB — CBC WITH DIFFERENTIAL/PLATELET
BASO%: 0.7 % (ref 0.0–2.0)
Basophils Absolute: 0 10*3/uL (ref 0.0–0.1)
EOS ABS: 0.1 10*3/uL (ref 0.0–0.5)
EOS%: 1.4 % (ref 0.0–7.0)
HEMATOCRIT: 39.2 % (ref 34.8–46.6)
HGB: 13.9 g/dL (ref 11.6–15.9)
LYMPH#: 0.9 10*3/uL (ref 0.9–3.3)
LYMPH%: 19.3 % (ref 14.0–49.7)
MCH: 34.3 pg — ABNORMAL HIGH (ref 25.1–34.0)
MCHC: 35.4 g/dL (ref 31.5–36.0)
MCV: 96.9 fL (ref 79.5–101.0)
MONO#: 0.4 10*3/uL (ref 0.1–0.9)
MONO%: 7.3 % (ref 0.0–14.0)
NEUT#: 3.5 10*3/uL (ref 1.5–6.5)
NEUT%: 71.3 % (ref 38.4–76.8)
PLATELETS: 171 10*3/uL (ref 145–400)
RBC: 4.04 10*6/uL (ref 3.70–5.45)
RDW: 13.4 % (ref 11.2–14.5)
WBC: 4.9 10*3/uL (ref 3.9–10.3)

## 2017-05-12 LAB — COMPREHENSIVE METABOLIC PANEL
ALT: 25 U/L (ref 0–55)
ANION GAP: 7 meq/L (ref 3–11)
AST: 34 U/L (ref 5–34)
Albumin: 4.1 g/dL (ref 3.5–5.0)
Alkaline Phosphatase: 113 U/L (ref 40–150)
BILIRUBIN TOTAL: 1.58 mg/dL — AB (ref 0.20–1.20)
BUN: 11.1 mg/dL (ref 7.0–26.0)
CO2: 25 meq/L (ref 22–29)
CREATININE: 0.8 mg/dL (ref 0.6–1.1)
Calcium: 9.9 mg/dL (ref 8.4–10.4)
Chloride: 107 mEq/L (ref 98–109)
EGFR: 76 mL/min/{1.73_m2} — ABNORMAL LOW (ref >90)
GLUCOSE: 111 mg/dL (ref 70–140)
Potassium: 4 mEq/L (ref 3.5–5.1)
Sodium: 140 mEq/L (ref 136–145)
TOTAL PROTEIN: 7.5 g/dL (ref 6.4–8.3)

## 2017-05-12 LAB — TSH: TSH: 1.892 m[IU]/L (ref 0.308–3.960)

## 2017-05-12 MED ORDER — SODIUM CHLORIDE 0.9% FLUSH
10.0000 mL | INTRAVENOUS | Status: DC | PRN
  Filled 2017-05-12: qty 10

## 2017-05-12 MED ORDER — NIVOLUMAB CHEMO INJECTION 100 MG/10ML
480.0000 mg | Freq: Once | INTRAVENOUS | Status: AC
  Administered 2017-05-12: 480 mg via INTRAVENOUS
  Filled 2017-05-12: qty 48

## 2017-05-12 MED ORDER — SODIUM CHLORIDE 0.9 % IV SOLN
Freq: Once | INTRAVENOUS | Status: AC
  Administered 2017-05-12: 12:00:00 via INTRAVENOUS

## 2017-05-12 NOTE — Progress Notes (Signed)
[facility] Follow up:    [name] [address] [place] [address]   DIAGNOSIS: Metastatic non-small cell lung cancer, adenocarcinoma. This was initially diagnosed as stage IB (T2a, N0, M0) in March of 2014, status post right upper lobectomy with lymph node dissection but the patient has evidence for disease recurrence in the right axilla status post resection.  SUMMARY OF ONCOLOGIC HISTORY: Oncology History   Patient follow due to history NSCLC stage IB s/p resection 2014     Lung cancer, primary, with metastasis from lung to other site [facility])   04/25/2014 Imaging    CT Chest without contrast impression status post right upper lobectomy with stable postoperative changes in the right hemithorax.  No findings to suggest local recurrence of disease or metastatic disease in the chest.  (See scan for more information)      05/01/2014 Pathology Results    Lymph node biopsy right axillary mass one lymph node, postive for metastatic adenocarcinoma       05/01/2014 Surgery    Right axillary lynph node biopsy      05/05/2014 Initial Diagnosis    Lung cancer, primary, with metastasis from lung to other site      05/05/2014 Imaging    CT Chest/Abdomen/Pelvis impression interval progression of mediastinal lymphodenopathy compatible with worsening metastatic disease.  Donminate left-sided chest was lesion also shows clear interval progression (See scan for more information)      05/17/2014 Imaging    CT Head with and without contrast impression no evidence for intracranial metastatic diease.  Chronic changes as described.      05/17/2014 Imaging    PET impression hypermetabolic periportal lymph nodes are indeterminate but concerning in light of the metastatic right axial lymph node.  Small hypermetabolic left axillary lymph node is indeterminate.  No additional evidence of metastatic lung cancer.        PRIOR THERAPY:  1) Status post right  axillary lymph node biopsy on 05/01/2014 under the care of Dr. Roxan Hockey. 2) Systemic chemotherapy with carboplatin for AUC of 5 and Alimta 500 mg/M2 every 3 weeks. First cycle 05/31/2014. Status post 6 cycles. Last dose was given 09/22/2014. 3) palliative radiotherapy to the subcarinal lymph node under the care of Dr. Sondra Come. 4) Nivolumab 240 mg IV every 2 weeks. First dose 12/23/2016. Status post 6 cycles.  CURRENT THERAPY: Nivolumab 480 mg IV every 4 weeks. First dose 03/17/2017. Status post two cycles.  INTERVAL HISTORY: Nancy Hodges 71 y.o. female returns for routine follow-up by herself. The patient feels Hodges without any complaints except for a dry spot on her back. She has noticed a slightly elevated dry area underneath her bra line near her spine. The area is not itching and there is no leading or drainage noted. She denied having any nausea or vomiting. She denied having any skin rash or diarrhea. She has no fever or chills. She has no significant chest pain, shortness of breath, cough or hemoptysis. The patient is here for evaluation prior to cycle #3 of her Nivolumab 480 mg IV every 4 weeks..   Patient Active Problem List   Diagnosis Date Noted  . Seborrheic keratosis 05/12/2017  . Nausea and vomiting 02/19/2017  . Weakness 02/19/2017  . SIRS (systemic inflammatory response syndrome) ([place]) 02/19/2017  . Goals of care, counseling/discussion 12/09/2016  . Encounter for antineoplastic immunotherapy 12/09/2016  . Esophageal varices in other disease   . Hepatic cirrhosis ([place]) 01/09/2016  .  Elevated LFTs 11/07/2015  . Cholelithiases 09/26/2015  . Melena   . GI bleed 07/29/2014  . Lung cancer, primary, with metastasis from lung to other site [facility]) 05/05/2014  . Arthralgia 10/05/2013  . Obstructive chronic bronchitis without exacerbation Gold A  02/11/2013  . Non-small cell carcinoma of right lung, stage 4 ([place]) 09/29/2012  . 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 Paroxysmal atrial fibrillation (HCC)     is allergic to prednisone; septra [sulfamethoxazole-trimethoprim]; penicillins; and vioxx [rofecoxib].  MEDICAL HISTORY: Past Medical History:  Diagnosis Date  . Allergic rhinitis   . Anxiety   . Arthritis   . Asthma   . Atrial flutter ([place])   . Cholelithiases 09/26/2015  . Diverticulosis   . Encounter for antineoplastic immunotherapy 12/09/2016  . GERD (gastroesophageal reflux disease)   . H/O hiatal hernia   . History of kidney stones   . Internal hemorrhoid   . Lung cancer ([place])    a. Stage IB non-small cell carcinoma, s/p R VATS, wedge resection, RU lobectomy 10/2012.  [name] Other specified diabetes mellitus with hyperglycemia ([place])    hx of chemo induced high blood sugar  . Paroxysmal atrial fibrillation (HCC)    a. anticoagulated with Xarelto  . Pneumonia 06/2016   [facility]  . Pulmonary nodule   . Tachycardia-bradycardia syndrome ([place])    a. 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    SURGICAL HISTORY: Past Surgical History:  Procedure Laterality Date  . CATARACT EXTRACTION W/PHACO Right 08/14/2016   Procedure: CATARACT EXTRACTION PHACO AND INTRAOCULAR LENS PLACEMENT RIGHT EYE CDE 10.53;  Surgeon: Tonny Branch, MD;  Location: [facility];  Service: Ophthalmology;  Laterality: Right;  right  . CATARACT EXTRACTION W/PHACO Left 09/25/2016   Procedure: CATARACT EXTRACTION PHACO AND INTRAOCULAR LENS PLACEMENT (IOC);  Surgeon: Tonny Branch, MD;  Location: [facility];  Service: Ophthalmology;  Laterality: Left;  left CDE 8.81  . COLONOSCOPY     [facility]: cattered sigmoid diverticula, internal hemorrhoids, sessile polyp in ascending and mid transverse colon (tubular adenoma).   . colonoscopy with polypectomy  2015  . ESOPHAGOGASTRODUODENOSCOPY     [facility]: bile reflux in stomach, mild gastritis, hiatal hernia  . ESOPHAGOGASTRODUODENOSCOPY N/A 01/31/2016   Procedure: ESOPHAGOGASTRODUODENOSCOPY (EGD);  Surgeon: [name], MD;  Location: [facility]  ENDO SUITE;  Service: Endoscopy;  Laterality: N/A;  1200  . LOBECTOMY Right 10/27/2012   Procedure: LOBECTOMY;  Surgeon: [name], MD;  Location[place];  Service: Thoracic;  Laterality: Right;   RIGHT UPPER LOBECTOMY & Node Dissection  . LYMPH NODE BIOPSY Right 05/01/2014   Procedure: LYMPH NODE BIOPSY, Right Axillary;  Surgeon: [name], MD;  Location: [place];  Service: Thoracic;  Laterality: Right;  . PARTIAL HYSTERECTOMY    . PERMANENT PACEMAKER INSERTION N/A 09/14/2012   Nanostim (SJM) leadless pacemaker (LEADLESS II STUDY PATIENT) implanted by Dr [name]  . VIDEO ASSISTED THORACOSCOPY (VATS)/WEDGE RESECTION Right 10/27/2012   Procedure: VIDEO ASSISTED THORACOSCOPY (VATS),RGHT UPPER LOBE LUNG WEDGE RESECTION;  Surgeon: [name], MD;  Location: [place];  Service: Thoracic;  Laterality: Right;    SOCIAL HISTORY: Social History   Social History  . Marital status: Single    Spouse name: N/A  . Number of children: N/A  . Years of education: N/A   Occupational History  . Not on file.   Social History Main Topics  . Smoking status: Former Smoker    Packs/day: 2.00    Years: 40.00    Types: Cigarettes  Quit date: 08/05/1995  . Smokeless tobacco: Never Used  . Alcohol use No  . Drug use: No  . Sexual activity: Not Currently   Other Topics Concern  . Not on file   Social History Narrative   Patient lives alone in [place].   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   Patient has 1 living daughter     FAMILY HISTORY: Family History  Problem Relation Age of Onset  . Heart disease Mother   . Diabetes Mother   . Kidney cancer Mother   . Asthma Father   . Heart disease Father   . Pancreatic cancer Brother   . Diabetes Sister   . Lung cancer Sister   . Diabetes Brother   . Heart attack Son   . Colon cancer Neg Hx     Review of Systems  Constitutional: Negative.   HENT:  Negative.   Eyes: Negative.   Respiratory: Negative.    Cardiovascular: Negative.   Gastrointestinal: Negative.   Genitourinary: Negative.    Musculoskeletal: Negative.   Skin: Negative for rash.       Dry skin to the center of her back.  Neurological: Negative.   Hematological: Negative.   Psychiatric/Behavioral: Negative.       PHYSICAL EXAMINATION  ECOG PERFORMANCE STATUS: 1 - Symptomatic but completely ambulatory  Vitals:   05/12/17 1103  BP: (!) 138/50  Pulse: 70  Resp: 18  Temp: 98 F (36.7 C)  SpO2: 98%    Physical Exam  Constitutional: She is oriented to person, place, and time and Hodges-developed, Hodges-nourished, and in no distress. No distress.  HENT:  Head: Normocephalic.  Mouth/Throat: Oropharynx is clear and moist. No oropharyngeal exudate.  Eyes: Conjunctivae are normal. Right eye exhibits no discharge. Left eye exhibits no discharge. No scleral icterus.  Neck: Normal range of motion. Neck supple.  Cardiovascular: Normal rate, regular rhythm, normal heart sounds and intact distal pulses.   Pulmonary/Chest: Effort normal and breath sounds normal. No respiratory distress. She has no wheezes. She has no rales.  Abdominal: Soft. Bowel sounds are normal. She exhibits no distension and no mass. There is no tenderness.  Musculoskeletal: Normal range of motion. She exhibits no edema.  Lymphadenopathy:    She has no cervical adenopathy.  Neurological: She is alert and oriented to person, place, and time. She exhibits normal muscle tone. Gait normal. Coordination normal.  Skin: Skin is warm and dry. No rash noted. She is not diaphoretic. No erythema. No pallor.  The patient has an approximately 1/4 cm slightly elevated light brown spot to her mid back. No excoriation or drainage noted.  Vitals reviewed.   LABORATORY DATA:  CBC    Component Value Date/Time   WBC 4.9 05/12/2017 1020   WBC 6.6 02/20/2017 0446   RBC 4.04 05/12/2017 1020   RBC 3.80 (L) 02/20/2017 0446   HGB 13.9 05/12/2017 1020   HCT 39.2 05/12/2017  1020   PLT 171 05/12/2017 1020   MCV 96.9 05/12/2017 1020   MCH 34.3 (H) 05/12/2017 1020   MCH 33.4 02/20/2017 0446   MCHC 35.4 05/12/2017 1020   MCHC 34.8 02/20/2017 0446   RDW 13.4 05/12/2017 1020   LYMPHSABS 0.9 05/12/2017 1020   MONOABS 0.4 05/12/2017 1020   EOSABS 0.1 05/12/2017 1020   BASOSABS 0.0 05/12/2017 1020    CMP     Component Value Date/Time   NA 140 05/12/2017 1020   K 4.0 05/12/2017 1020   CL 111 02/19/2017 0458  CO2 25 05/12/2017 1020   GLUCOSE 111 05/12/2017 1020   BUN 11.1 05/12/2017 1020   CREATININE 0.8 05/12/2017 1020   CALCIUM 9.9 05/12/2017 1020   PROT 7.5 05/12/2017 1020   ALBUMIN 4.1 05/12/2017 1020   AST 34 05/12/2017 1020   ALT 25 05/12/2017 1020   ALKPHOS 113 05/12/2017 1020   BILITOT 1.58 (H) 05/12/2017 1020   GFRNONAA >60 02/19/2017 0458   GFRAA >60 02/19/2017 0458    RADIOGRAPHIC STUDIES:  No results found.  ASSESSMENT and THERAPY PLAN:   Non-small cell carcinoma of right lung, stage 4 (HCC) This is a very pleasant 71 year old white female with metastatic non-small cell lung cancer, adenocarcinoma with no actionable mutations status post systemic chemotherapy with carboplatin and Alimta and she is currently undergoing treatment with second line immunotherapy with Nivolumab status post 6 cycles. This was followed by immunotherapy with Nivolumab 480 mg IV every 4 weeks status post 2 cycles. She is tolerating treatment Hodges. I recommended for the patient to continue her current treatment with Nivolumab 480 MG IV every 4 weeks and she will proceed with cycle #3 today.  The patient will return in 4 weeks with the next cycle of her treatment. She was advised to call immediately if she has any concerning symptoms in the interval. The patient voices understanding of current disease status and treatment options and is in agreement with the current care plan. All questions were answered. The patient knows to call the clinic with any problems,  questions or concerns. We can certainly see the patient much sooner if necessary.  Seborrheic keratosis Lesion on the patient's back as consistent with seborrheic keratosis. The area is not infected or inflamed and it is not bothering her at this time. Recommend observation. If this area becomes inflamed or starts bleeding, recommend follow-up with dermatology.   No orders of the defined types were placed in this encounter.   All questions were answered. The patient knows to call the clinic with any problems, questions or concerns. We can certainly see the patient much sooner if necessary.  [name], NP 05/12/2017

## 2017-05-12 NOTE — Progress Notes (Signed)
Per [name] NP okay to treat with Bilirubin of 1.58.

## 2017-05-12 NOTE — Patient Instructions (Signed)
[facility] Discharge Instructions for Patients Receiving Chemotherapy  Today you received the following chemotherapy agents: Nivolumab  To help prevent nausea and vomiting after your treatment, we encourage you to take your nausea medication as directed.    If you develop nausea and vomiting that is not controlled by your nausea medication, call the clinic.   BELOW ARE SYMPTOMS THAT SHOULD BE REPORTED IMMEDIATELY:  *FEVER GREATER THAN 100.5 F  *CHILLS WITH OR WITHOUT FEVER  NAUSEA AND VOMITING THAT IS NOT CONTROLLED WITH YOUR NAUSEA MEDICATION  *UNUSUAL SHORTNESS OF BREATH  *UNUSUAL BRUISING OR BLEEDING  TENDERNESS IN MOUTH AND THROAT WITH OR WITHOUT PRESENCE OF ULCERS  *URINARY PROBLEMS  *BOWEL PROBLEMS  UNUSUAL RASH Items with * indicate a potential emergency and should be followed up as soon as possible.  Feel free to call the clinic should you have any questions or concerns. The clinic phone number is [phone].  Please show the CHEMO ALERT CARD at check-in to the Emergency Department and triage nurse.   

## 2017-05-12 NOTE — Assessment & Plan Note (Signed)
Lesion on the patient's back as consistent with seborrheic keratosis. The area is not infected or inflamed and it is not bothering her at this time. Recommend observation. If this area becomes inflamed or starts bleeding, recommend follow-up with dermatology.

## 2017-05-12 NOTE — Assessment & Plan Note (Signed)
This is a very pleasant 71 year old white female with metastatic non-small cell lung cancer, adenocarcinoma with no actionable mutations status post systemic chemotherapy with carboplatin and Alimta and she is currently undergoing treatment with second line immunotherapy with Nivolumab status post 6 cycles. This was followed by immunotherapy with Nivolumab 480 mg IV every 4 weeks status post 2 cycles. She is tolerating treatment well. I recommended for the patient to continue her current treatment with Nivolumab 480 MG IV every 4 weeks and she will proceed with cycle #3 today.  The patient will return in 4 weeks with the next cycle of her treatment. She was advised to call immediately if she has any concerning symptoms in the interval. The patient voices understanding of current disease status and treatment options and is in agreement with the current care plan. All questions were answered. The patient knows to call the clinic with any problems, questions or concerns. We can certainly see the patient much sooner if necessary.

## 2017-05-13 ENCOUNTER — Telehealth: Payer: Self-pay | Admitting: Oncology

## 2017-05-13 NOTE — Telephone Encounter (Signed)
No 10/9 los. °

## 2017-05-27 ENCOUNTER — Telehealth: Payer: Self-pay | Admitting: *Deleted

## 2017-05-27 ENCOUNTER — Other Ambulatory Visit: Payer: Self-pay | Admitting: *Deleted

## 2017-05-27 DIAGNOSIS — C3490 Malignant neoplasm of unspecified part of unspecified bronchus or lung: Secondary | ICD-10-CM

## 2017-05-27 NOTE — Telephone Encounter (Signed)
-----   Message from [facility] sent at 05/27/2017 11:14 AM EDT ----- Regarding: PORT PLACEMENT Patient called and requested to schedule a port placement . I let her know that that is scheduled with IR but there was no order in for IR for me to call.   Does she still need a port placed? Patient request morning of 11/23 - when the order is placed - that's the only time her daughter will be in town to help her.

## 2017-05-27 NOTE — Telephone Encounter (Signed)
Port a cath placement requested by patient, reviewed with MD.  Order placed, pt notified.  She will receive a call regarding port placement appt date.  Pt verbalized understanding, no further questions.

## 2017-05-28 ENCOUNTER — Other Ambulatory Visit: Payer: Self-pay | Admitting: Medical Oncology

## 2017-06-02 ENCOUNTER — Emergency Department (HOSPITAL_COMMUNITY): Payer: Medicare Other

## 2017-06-02 ENCOUNTER — Encounter (HOSPITAL_COMMUNITY): Payer: Self-pay | Admitting: *Deleted

## 2017-06-02 ENCOUNTER — Emergency Department (HOSPITAL_COMMUNITY)
Admission: EM | Admit: 2017-06-02 | Discharge: 2017-06-02 | Disposition: A | Discharge status: Home / Self Care | Payer: Medicare Other | Attending: Emergency Medicine | Admitting: Emergency Medicine

## 2017-06-02 DIAGNOSIS — R0981 Nasal congestion: Secondary | ICD-10-CM | POA: Diagnosis present

## 2017-06-02 DIAGNOSIS — R002 Palpitations: Secondary | ICD-10-CM | POA: Diagnosis not present

## 2017-06-02 DIAGNOSIS — J0100 Acute maxillary sinusitis, unspecified: Secondary | ICD-10-CM | POA: Insufficient documentation

## 2017-06-02 DIAGNOSIS — J45909 Unspecified asthma, uncomplicated: Secondary | ICD-10-CM | POA: Diagnosis not present

## 2017-06-02 DIAGNOSIS — Z7901 Long term (current) use of anticoagulants: Secondary | ICD-10-CM | POA: Insufficient documentation

## 2017-06-02 DIAGNOSIS — Z85118 Personal history of other malignant neoplasm of bronchus and lung: Secondary | ICD-10-CM | POA: Diagnosis not present

## 2017-06-02 DIAGNOSIS — Z87891 Personal history of nicotine dependence: Secondary | ICD-10-CM | POA: Diagnosis not present

## 2017-06-02 DIAGNOSIS — Z950 Presence of cardiac pacemaker: Secondary | ICD-10-CM | POA: Insufficient documentation

## 2017-06-02 DIAGNOSIS — Z79899 Other long term (current) drug therapy: Secondary | ICD-10-CM | POA: Diagnosis not present

## 2017-06-02 LAB — RAPID STREP SCREEN (MED CTR MEBANE ONLY): STREPTOCOCCUS, GROUP A SCREEN (DIRECT): NEGATIVE

## 2017-06-02 MED ORDER — CEPHALEXIN 500 MG PO CAPS: 500.0000 mg | ORAL_CAPSULE | Freq: Four times a day (QID) | ORAL | 0 refills | Status: DC

## 2017-06-02 NOTE — Discharge Instructions (Signed)
Follow-up with your family doctor in 1-2 weeks for your sinus problems.  Follow-up with your cardiologist next week for recheck

## 2017-06-02 NOTE — ED Provider Notes (Signed)
[facility] EMERGENCY DEPARTMENT Provider Note   CSN: [id number] Arrival date & time: 06/02/17  1753     History   Chief Complaint Chief Complaint  Patient presents with  . Abnormal ECG    HPI Nancy Hodges is a 71 y.o. female.  Patient complains of sinus congestion.  She is also was seen by her doctor today who knows she had irregular heartbeat.   The history is provided by the patient.  URI   This is a new problem. The current episode started 2 days ago. The problem has not changed since onset.There has been no fever. Pertinent negatives include no chest pain, no abdominal pain, no diarrhea, no congestion, no headaches, no cough and no rash. She has tried nothing for the symptoms. The treatment provided no relief.    Past Medical History:  Diagnosis Date  . Allergic rhinitis   . Anxiety   . Arthritis   . Asthma   . Atrial flutter ([place])   . Cholelithiases 09/26/2015  . Diverticulosis   . Encounter for antineoplastic immunotherapy 12/09/2016  . GERD (gastroesophageal reflux disease)   . H/O hiatal hernia   . History of kidney stones   . Internal hemorrhoid   . Lung cancer ([place])    a. Stage IB non-small cell carcinoma, s/p R VATS, wedge resection, RU lobectomy 10/2012.  . Paroxysmal atrial fibrillation ([facility])    a. anticoagulated with Xarelto  . Pneumonia 06/2016   [facility]  . Pulmonary nodule   . Tachycardia-bradycardia syndrome ([place])    a. 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    Patient Active Problem List   Diagnosis Date Noted  . Seborrheic keratosis 05/12/2017  . Nausea and vomiting 02/19/2017  . Weakness 02/19/2017  . SIRS (systemic inflammatory response syndrome) ([place]) 02/19/2017  . Goals of care, counseling/discussion 12/09/2016  . Encounter for antineoplastic immunotherapy 12/09/2016  . Esophageal varices in other disease   . Hepatic cirrhosis ([place]) 01/09/2016  . Elevated LFTs 11/07/2015  . Cholelithiases 09/26/2015  . Melena   .  GI bleed 07/29/2014  . Lung cancer, primary, with metastasis from lung to other site [facility]) 05/05/2014  . Arthralgia 10/05/2013  . Obstructive chronic bronchitis without exacerbation [facility] A  02/11/2013  . Non-small cell carcinoma of right lung, stage 4 ([place]) 09/29/2012  . Tachycardia-bradycardia syndrome ([place]) 09/06/2012  . Paroxysmal atrial fibrillation [facility])     Past Surgical History:  Procedure Laterality Date  . CATARACT EXTRACTION W/PHACO Right 08/14/2016   Procedure: CATARACT EXTRACTION PHACO AND INTRAOCULAR LENS PLACEMENT RIGHT EYE CDE 10.53;  Surgeon: Tonny Branch, MD;  Location: [facility] ORS;  Service: Ophthalmology;  Laterality: Right;  right  . CATARACT EXTRACTION W/PHACO Left 09/25/2016   Procedure: CATARACT EXTRACTION PHACO AND INTRAOCULAR LENS PLACEMENT (IOC);  Surgeon: Tonny Branch, MD;  Location: [facility] ORS;  Service: Ophthalmology;  Laterality: Left;  left CDE 8.81  . COLONOSCOPY     [facility]: cattered sigmoid diverticula, internal hemorrhoids, sessile polyp in ascending and mid transverse colon (tubular adenoma).   . colonoscopy with polypectomy  2015  . ESOPHAGOGASTRODUODENOSCOPY     [facility]: bile reflux in stomach, mild gastritis, hiatal hernia  . ESOPHAGOGASTRODUODENOSCOPY N/A 01/31/2016   Procedure: ESOPHAGOGASTRODUODENOSCOPY (EGD);  Surgeon: [name], MD;  Location: [facility] ENDO SUITE;  Service: Endoscopy;  Laterality: N/A;  1200  . LOBECTOMY Right 10/27/2012   Procedure: LOBECTOMY;  Surgeon: [name], MD;  Location: [place];  Service: Thoracic;  Laterality: Right;   RIGHT UPPER LOBECTOMY &  Node Dissection  . LYMPH NODE BIOPSY Right 05/01/2014   Procedure: LYMPH NODE BIOPSY, Right Axillary;  Surgeon: [name], MD;  Location: [place];  Service: Thoracic;  Laterality: Right;  . PARTIAL HYSTERECTOMY    . PERMANENT PACEMAKER INSERTION N/A 09/14/2012   Nanostim (SJM) leadless pacemaker (LEADLESS II STUDY PATIENT) implanted by Dr [name]  . VIDEO ASSISTED THORACOSCOPY  (VATS)/WEDGE RESECTION Right 10/27/2012   Procedure: VIDEO ASSISTED THORACOSCOPY (VATS),RGHT UPPER LOBE LUNG WEDGE RESECTION;  Surgeon: [name], MD;  Location: [facility] OR;  Service: Thoracic;  Laterality: Right;    OB History    Gravida Para Term Preterm AB Living   1 1 1          SAB TAB Ectopic Multiple Live Births                   Home Medications    Prior to Admission medications   Medication Sig Start Date End Date Taking? Authorizing Provider  acetaminophen (TYLENOL) 500 MG tablet Take 500 mg by mouth every 6 (six) hours as needed for moderate pain or fever.    Yes [provider]  albuterol (PROVENTIL HFA;VENTOLIN HFA) 108 (90 BASE) MCG/ACT inhaler Inhale 1-2 puffs into the lungs every 6 (six) hours as needed for wheezing or shortness of breath.   Yes [provider]  Chlorpheniramine-DM (CORICIDIN COUGH/COLD) 4-30 MG TABS Take 1-2 tablets by mouth daily as needed (FOR COLD SYMPTOMS).   Yes [provider]  diltiazem (CARDIZEM CD) 240 MG 24 hr capsule Take 1 capsule (240 mg total) by mouth daily. 03/30/17  Yes Allred, Jeneen Rinks, MD  hydroxypropyl methylcellulose / hypromellose (ISOPTO TEARS / GONIOVISC) 2.5 % ophthalmic solution Place 1-2 drops into the left eye as needed for dry eyes.   Yes [provider]  ipratropium-albuterol (DUONEB) 0.5-2.5 (3) MG/3ML SOLN Take 3 mLs by nebulization every 6 (six) hours as needed (shortness of breath or wheezing).    Yes [provider]  loratadine (CLARITIN) 10 MG tablet Take 10 mg by mouth daily.   Yes [provider]  Multiple Vitamin (MULTIVITAMIN) tablet Take 1 tablet by mouth daily.   Yes [provider]  rivaroxaban (XARELTO) 20 MG TABS tablet Take 1 tablet (20 mg total) by mouth daily with supper. 03/30/17  Yes Allred, Jeneen Rinks, MD  cephALEXin (KEFLEX) 500 MG capsule Take 1 capsule (500 mg total) by mouth 4 (four) times daily. 06/02/17   Milton Ferguson, MD    Family  History Family History  Problem Relation Age of Onset  . Heart disease Mother   . Diabetes Mother   . Kidney cancer Mother   . Asthma Father   . Heart disease Father   . Pancreatic cancer Brother   . Diabetes Sister   . Lung cancer Sister   . Diabetes Brother   . Heart attack Son   . Colon cancer Neg Hx     Social History Social History  Substance Use Topics  . Smoking status: Former Smoker    Packs/day: 2.00    Years: 40.00    Types: Cigarettes    Quit date: 08/05/1995  . Smokeless tobacco: Never Used  . Alcohol use No     Allergies   Prednisone; Septra [sulfamethoxazole-trimethoprim]; Penicillins; and Vioxx [rofecoxib]   Review of Systems Review of Systems  Constitutional: Negative for appetite change and fatigue.  HENT: Positive for sinus pressure. Negative for congestion and ear discharge.   Eyes: Negative for discharge.  Respiratory:  Negative for cough.   Cardiovascular: Positive for palpitations. Negative for chest pain.  Gastrointestinal: Negative for abdominal pain and diarrhea.  Genitourinary: Negative for frequency and hematuria.  Musculoskeletal: Negative for back pain.  Skin: Negative for rash.  Neurological: Negative for seizures and headaches.  Psychiatric/Behavioral: Negative for hallucinations.     Physical Exam Updated Vital Signs BP (!) 144/69 (BP Location: Left Arm)   Pulse 82   Temp 99 F (37.2 C) (Oral)   Resp (!) 21   Ht 5\' 2"  (1.575 m)   Wt 67.1 kg (148 lb)   SpO2 98%   BMI 27.07 kg/m   Physical Exam  Constitutional: She is oriented to person, place, and time. She appears well-developed.  HENT:  Head: Normocephalic.  Tender frontal sinus  Eyes: Conjunctivae and EOM are normal. No scleral icterus.  Neck: Neck supple. No thyromegaly present.  Cardiovascular: Exam reveals no gallop and no friction rub.   No murmur heard. Irregular heart rate  Pulmonary/Chest: No stridor. She has no wheezes. She has no rales. She exhibits no  tenderness.  Abdominal: She exhibits no distension. There is no tenderness. There is no rebound.  Musculoskeletal: Normal range of motion. She exhibits no edema.  Lymphadenopathy:    She has no cervical adenopathy.  Neurological: She is oriented to person, place, and time. She exhibits normal muscle tone. Coordination normal.  Skin: No rash noted. No erythema.  Psychiatric: She has a normal mood and affect. Her behavior is normal.     ED Treatments / Results  Labs (all labs ordered are listed, but only abnormal results are displayed) Labs Reviewed  RAPID STREP SCREEN (NOT AT [facility])  CULTURE, GROUP A STREP [facility])    EKG  EKG Interpretation  Date/Time:  Tuesday June 02 2017 18:07:17 EDT Ventricular Rate:  87 PR Interval:    QRS Duration: 82 QT Interval:  380 QTC Calculation: 457 R Axis:   77 Text Interpretation:  Atrial flutter with variable A-V block Abnormal ECG atrial flutter is new since last tracing Confirmed by Dorie Rank [phone]) on 06/02/2017 6:11:05 PM Also confirmed by Milton Ferguson [phone])  on 06/02/2017 7:55:48 PM       Radiology Dg Chest 2 View  Result Date: 06/02/2017 CLINICAL DATA:  Palpitations. EXAM: CHEST  2 VIEW COMPARISON:  02/18/2017 FINDINGS: Right upper lobe resection with scarring unchanged. Right basilar pleural scarring and elevated right hemidiaphragm unchanged. Chronic lung disease. Negative for heart failure pneumonia or effusion. Loop recorder. No change from prior studies. IMPRESSION: COPD with postop changes on the right.  No acute abnormality. Electronically Signed   By: Franchot Gallo M.D.   On: 06/02/2017 19:30    Procedures Procedures (including critical care time)  Medications Ordered in ED Medications - No data to display   Initial Impression / Assessment and Plan / ED Course  I have reviewed the triage vital signs and the nursing notes.  Pertinent labs & imaging results that were available during my care of the patient were  reviewed by me and considered in my medical decision making (see chart for details).     EKG shows patient to be in atrial fibrillation her.  She has had this before.  I spoke with cardiology and their recommendation was to just follow-up with her cardiologist in the next week and return if any symptoms start.  She also has a sinusitis she will be placed on Keflex  Final Clinical Impressions(s) / ED Diagnoses   Final diagnoses:  Acute  maxillary sinusitis, recurrence not specified    New Prescriptions New Prescriptions   CEPHALEXIN (KEFLEX) 500 MG CAPSULE    Take 1 capsule (500 mg total) by mouth 4 (four) times daily.     Milton Ferguson, MD 06/02/17 2144

## 2017-06-02 NOTE — ED Triage Notes (Addendum)
Pt was at her PCP today for c/o nasal congestion, cough, chest heaviness and blood pressure being elevated more than usual since last Friday. Pt reports she was sent to the ED from her doctors office due to an abnormal EKG. Pt denies SOB.

## 2017-06-02 NOTE — ED Notes (Signed)
Patient transported to X-ray 

## 2017-06-03 ENCOUNTER — Telehealth: Payer: Self-pay | Admitting: Internal Medicine

## 2017-06-03 NOTE — Telephone Encounter (Signed)
Pt would like a call back about her medications and has an irregular heart beat.   Pt would like to be seen by Dr [name].

## 2017-06-03 NOTE — Telephone Encounter (Signed)
Spoke with patient regarding her medication concerns.  She verbalized understanding.  She will contact office if she feels the need to follow up with Dr. [name].

## 2017-06-05 LAB — CULTURE, GROUP A STREP (THRC)

## 2017-06-09 ENCOUNTER — Ambulatory Visit (HOSPITAL_BASED_OUTPATIENT_CLINIC_OR_DEPARTMENT_OTHER): Payer: Medicare Other

## 2017-06-09 ENCOUNTER — Encounter: Payer: Self-pay | Admitting: *Deleted

## 2017-06-09 ENCOUNTER — Ambulatory Visit (HOSPITAL_BASED_OUTPATIENT_CLINIC_OR_DEPARTMENT_OTHER): Payer: Medicare Other | Admitting: Internal Medicine

## 2017-06-09 ENCOUNTER — Other Ambulatory Visit (HOSPITAL_BASED_OUTPATIENT_CLINIC_OR_DEPARTMENT_OTHER): Payer: Medicare Other

## 2017-06-09 ENCOUNTER — Encounter: Payer: Self-pay | Admitting: Internal Medicine

## 2017-06-09 ENCOUNTER — Telehealth: Payer: Self-pay

## 2017-06-09 DIAGNOSIS — C3491 Malignant neoplasm of unspecified part of right bronchus or lung: Secondary | ICD-10-CM

## 2017-06-09 DIAGNOSIS — C773 Secondary and unspecified malignant neoplasm of axilla and upper limb lymph nodes: Secondary | ICD-10-CM

## 2017-06-09 DIAGNOSIS — R5382 Chronic fatigue, unspecified: Secondary | ICD-10-CM

## 2017-06-09 DIAGNOSIS — Z5112 Encounter for antineoplastic immunotherapy: Secondary | ICD-10-CM

## 2017-06-09 DIAGNOSIS — C3411 Malignant neoplasm of upper lobe, right bronchus or lung: Secondary | ICD-10-CM

## 2017-06-09 DIAGNOSIS — C3490 Malignant neoplasm of unspecified part of unspecified bronchus or lung: Secondary | ICD-10-CM

## 2017-06-09 DIAGNOSIS — Z79899 Other long term (current) drug therapy: Secondary | ICD-10-CM | POA: Diagnosis not present

## 2017-06-09 LAB — CBC WITH DIFFERENTIAL/PLATELET
BASO%: 0.8 % (ref 0.0–2.0)
Basophils Absolute: 0 10*3/uL (ref 0.0–0.1)
EOS ABS: 0.1 10*3/uL (ref 0.0–0.5)
EOS%: 1.4 % (ref 0.0–7.0)
HCT: 39.9 % (ref 34.8–46.6)
HGB: 13.8 g/dL (ref 11.6–15.9)
LYMPH%: 18.1 % (ref 14.0–49.7)
MCH: 33.4 pg (ref 25.1–34.0)
MCHC: 34.7 g/dL (ref 31.5–36.0)
MCV: 96.1 fL (ref 79.5–101.0)
MONO#: 0.3 10*3/uL (ref 0.1–0.9)
MONO%: 6 % (ref 0.0–14.0)
NEUT#: 3.2 10*3/uL (ref 1.5–6.5)
NEUT%: 73.7 % (ref 38.4–76.8)
Platelets: 169 10*3/uL (ref 145–400)
RBC: 4.15 10*6/uL (ref 3.70–5.45)
RDW: 13.4 % (ref 11.2–14.5)
WBC: 4.3 10*3/uL (ref 3.9–10.3)
lymph#: 0.8 10*3/uL — ABNORMAL LOW (ref 0.9–3.3)

## 2017-06-09 LAB — COMPREHENSIVE METABOLIC PANEL
ALBUMIN: 3.8 g/dL (ref 3.5–5.0)
ALK PHOS: 105 U/L (ref 40–150)
ALT: 32 U/L (ref 0–55)
AST: 35 U/L — AB (ref 5–34)
Anion Gap: 8 mEq/L (ref 3–11)
BUN: 11.5 mg/dL (ref 7.0–26.0)
CHLORIDE: 106 meq/L (ref 98–109)
CO2: 25 mEq/L (ref 22–29)
CREATININE: 0.8 mg/dL (ref 0.6–1.1)
Calcium: 9.7 mg/dL (ref 8.4–10.4)
EGFR: >60 mL/min/{1.73_m2} (ref >60)
GLUCOSE: 214 mg/dL — AB (ref 70–140)
POTASSIUM: 4.3 meq/L (ref 3.5–5.1)
SODIUM: 139 meq/L (ref 136–145)
Total Bilirubin: 1.38 mg/dL — ABNORMAL HIGH (ref 0.20–1.20)
Total Protein: 7.2 g/dL (ref 6.4–8.3)

## 2017-06-09 LAB — TSH: TSH: 2.326 m[IU]/L (ref 0.308–3.960)

## 2017-06-09 MED ORDER — SODIUM CHLORIDE 0.9 % IV SOLN
Freq: Once | INTRAVENOUS | Status: AC
  Administered 2017-06-09: 13:00:00 via INTRAVENOUS

## 2017-06-09 MED ORDER — SODIUM CHLORIDE 0.9 % IV SOLN
480.0000 mg | Freq: Once | INTRAVENOUS | Status: AC
  Administered 2017-06-09: 480 mg via INTRAVENOUS
  Filled 2017-06-09: qty 48

## 2017-06-09 NOTE — Patient Instructions (Signed)
[facility] Discharge Instructions for Patients Receiving Chemotherapy  Today you received the following chemotherapy agents: Nivolumab  To help prevent nausea and vomiting after your treatment, we encourage you to take your nausea medication as directed.    If you develop nausea and vomiting that is not controlled by your nausea medication, call the clinic.   BELOW ARE SYMPTOMS THAT SHOULD BE REPORTED IMMEDIATELY:  *FEVER GREATER THAN 100.5 F  *CHILLS WITH OR WITHOUT FEVER  NAUSEA AND VOMITING THAT IS NOT CONTROLLED WITH YOUR NAUSEA MEDICATION  *UNUSUAL SHORTNESS OF BREATH  *UNUSUAL BRUISING OR BLEEDING  TENDERNESS IN MOUTH AND THROAT WITH OR WITHOUT PRESENCE OF ULCERS  *URINARY PROBLEMS  *BOWEL PROBLEMS  UNUSUAL RASH Items with * indicate a potential emergency and should be followed up as soon as possible.  Feel free to call the clinic should you have any questions or concerns. The clinic phone number is [phone].  Please show the CHEMO ALERT CARD at check-in to the Emergency Department and triage nurse.   

## 2017-06-09 NOTE — Telephone Encounter (Signed)
Printed avs and calender for upcoming appointment. Per 11/6 los

## 2017-06-09 NOTE — Progress Notes (Signed)
[facility] [phone]   [phone]  OFFICE PROGRESS NOTE  [name] [address] [place] [address]  DIAGNOSIS: Metastatic non-small cell lung cancer, adenocarcinoma. This was initially diagnosed as stage IB (T2a, N0, M0) in March of 2014, status post right upper lobectomy with lymph node dissection but the patient has evidence for disease recurrence in the right axilla status post resection.  PRIOR THERAPY:  1) Status post right axillary lymph node biopsy on 05/01/2014 under the care of Dr. Roxan Hockey. 2) Systemic chemotherapy with carboplatin for AUC of 5 and Alimta 500 mg/M2 every 3 weeks. First cycle 05/31/2014. Status post 6 cycles. Last dose was given 09/22/2014. 3) palliative radiotherapy to the subcarinal lymph node under the care of Dr. Sondra Come. 4) Nivolumab 240 mg IV every 2 weeks. First dose 12/23/2016. Status post 6 cycles.  CURRENT THERAPY:  Nivolumab 480 mg IV every 4 weeks. First dose 03/17/2017. Status post 3 cycles.  INTERVAL HISTORY: Nancy Hodges 71 y.o. female returns to the clinic today for follow-up visit.  The patient is feeling fine today with no specific complaints.  She was recently treated for sinus infection with a course of Keflex and she is still on the antibiotics.  She denied having any chest pain, shortness of breath, cough or hemoptysis.  She denied having any fever or chills.  She has no nausea, vomiting, diarrhea or constipation.  She continues to tolerate her treatment with Nivolumab fairly well.  She is here today for evaluation before starting cycle #4.   MEDICAL HISTORY: Past Medical History:  Diagnosis Date  . Allergic rhinitis   . Anxiety   . Arthritis   . Asthma   . Atrial flutter ([place])   . Cholelithiases 09/26/2015  . Diverticulosis   . Encounter for antineoplastic immunotherapy 12/09/2016  . GERD (gastroesophageal reflux disease)   . H/O hiatal hernia   . History of  kidney stones   . Internal hemorrhoid   . Lung cancer ([place])    a. Stage IB non-small cell carcinoma, s/p R VATS, wedge resection, RU lobectomy 10/2012.  . Paroxysmal atrial fibrillation ([facility])    a. anticoagulated with Xarelto  . Pneumonia 06/2016   [facility]  . Pulmonary nodule   . Tachycardia-bradycardia syndrome ([place])    a. 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    ALLERGIES:  is allergic to prednisone; septra [sulfamethoxazole-trimethoprim]; penicillins; and vioxx [rofecoxib].  MEDICATIONS:  Current Outpatient Medications  Medication Sig Dispense Refill  . acetaminophen (TYLENOL) 500 MG tablet Take 500 mg by mouth every 6 (six) hours as needed for moderate pain or fever.     [name] albuterol (PROVENTIL HFA;VENTOLIN HFA) 108 (90 BASE) MCG/ACT inhaler Inhale 1-2 puffs into the lungs every 6 (six) hours as needed for wheezing or shortness of breath.    . cephALEXin (KEFLEX) 500 MG capsule Take 1 capsule (500 mg total) by mouth 4 (four) times daily. 28 capsule 0  . Chlorpheniramine-DM (CORICIDIN COUGH/COLD) 4-30 MG TABS Take 1-2 tablets by mouth daily as needed (FOR COLD SYMPTOMS).    [name] diltiazem (CARDIZEM CD) 240 MG 24 hr capsule Take 1 capsule (240 mg total) by mouth daily. 90 capsule 3  . hydroxypropyl methylcellulose / hypromellose (ISOPTO TEARS / GONIOVISC) 2.5 % ophthalmic solution Place 1-2 drops into the left eye as needed for dry eyes.    [name] ipratropium-albuterol (DUONEB) 0.5-2.5 (3) MG/3ML SOLN Take 3 mLs by nebulization every 6 (six)  hours as needed (shortness of breath or wheezing).     [name] loratadine (CLARITIN) 10 MG tablet Take 10 mg by mouth daily.    . Multiple Vitamin (MULTIVITAMIN) tablet Take 1 tablet by mouth daily.    . rivaroxaban (XARELTO) 20 MG TABS tablet Take 1 tablet (20 mg total) by mouth daily with supper. 30 tablet 11   No current facility-administered medications for this visit.     SURGICAL HISTORY:  Past Surgical History:  Procedure Laterality Date    . COLONOSCOPY     [facility]: cattered sigmoid diverticula, internal hemorrhoids, sessile polyp in ascending and mid transverse colon (tubular adenoma).   . colonoscopy with polypectomy  2015  . ESOPHAGOGASTRODUODENOSCOPY     [facility]: bile reflux in stomach, mild gastritis, hiatal hernia  . PARTIAL HYSTERECTOMY      REVIEW OF SYSTEMS:  A comprehensive review of systems was negative.   PHYSICAL EXAMINATION: General appearance: alert, cooperative and no distress Head: Normocephalic, without obvious abnormality, atraumatic Neck: no adenopathy, no JVD, supple, symmetrical, trachea midline and thyroid not enlarged, symmetric, no tenderness/mass/nodules Lymph nodes: Cervical, supraclavicular, and axillary nodes normal. Resp: clear to auscultation bilaterally Back: symmetric, no curvature. ROM normal. No CVA tenderness. Cardio: regular rate and rhythm, S1, S2 normal, no murmur, click, rub or gallop GI: soft, non-tender; bowel sounds normal; no masses,  no organomegaly Extremities: extremities normal, atraumatic, no cyanosis or edema  ECOG PERFORMANCE STATUS: 0 - Asymptomatic  Blood pressure (!) 145/67, pulse 73, temperature 98.1 F (36.7 C), temperature source Oral, resp. rate 18, height 5\' 2"  (1.575 m), weight 147 lb 8 oz (66.9 kg), SpO2 96 %.  LABORATORY DATA: Lab Results  Component Value Date   WBC 4.3 06/09/2017   HGB 13.8 06/09/2017   HCT 39.9 06/09/2017   MCV 96.1 06/09/2017   PLT 169 06/09/2017      Chemistry      Component Value Date/Time   NA 139 06/09/2017 0949   K 4.3 06/09/2017 0949   CL 111 02/19/2017 0458   CO2 25 06/09/2017 0949   BUN 11.5 06/09/2017 0949   CREATININE 0.8 06/09/2017 0949   GLU 134 (H) 02/18/2017 1410      Component Value Date/Time   CALCIUM 9.7 06/09/2017 0949   ALKPHOS 105 06/09/2017 0949   AST 35 (H) 06/09/2017 0949   ALT 32 06/09/2017 0949   BILITOT 1.38 (H) 06/09/2017 0949       RADIOGRAPHIC STUDIES: Dg Chest 2 View  Result  Date: 06/02/2017 CLINICAL DATA:  Palpitations. EXAM: CHEST  2 VIEW COMPARISON:  02/18/2017 FINDINGS: Right upper lobe resection with scarring unchanged. Right basilar pleural scarring and elevated right hemidiaphragm unchanged. Chronic lung disease. Negative for heart failure pneumonia or effusion. Loop recorder. No change from prior studies. IMPRESSION: COPD with postop changes on the right.  No acute abnormality. Electronically Signed   By: Franchot Gallo M.D.   On: 06/02/2017 19:30   ASSESSMENT AND PLAN:  This is a very pleasant 71 years old white female with metastatic non-small cell lung cancer, adenocarcinoma with no actionable mutations status post systemic chemotherapy with carboplatin and Alimta and she is currently undergoing treatment with second line immunotherapy with Nivolumab status post 6 cycles. This was followed by immunotherapy with Nivolumab 480 mg IV every 4 weeks status post 3 cycles. The patient continues to tolerate this treatment well with no significant adverse effects. I recommended for her to proceed with cycle #4 today as a schedule. I will see her  back for follow-up visit in 4 weeks for evaluation after repeating CT scan of the chest, abdomen and pelvis for restaging of her disease. The patient was advised to call immediately if she has any concerning symptoms in the interval. The patient voices understanding of current disease status and treatment options and is in agreement with the current care plan. All questions were answered. The patient knows to call the clinic with any problems, questions or concerns. We can certainly see the patient much sooner if necessary.  Disclaimer: This note was dictated with voice recognition software. Similar sounding words can inadvertently be transcribed and may not be corrected upon review.

## 2017-06-10 ENCOUNTER — Other Ambulatory Visit (HOSPITAL_COMMUNITY): Payer: Medicare Other

## 2017-06-26 ENCOUNTER — Other Ambulatory Visit: Payer: Self-pay | Admitting: Radiology

## 2017-06-30 ENCOUNTER — Ambulatory Visit (HOSPITAL_COMMUNITY)
Admission: RE | Admit: 2017-06-30 | Discharge: 2017-06-30 | Disposition: A | Discharge status: Home / Self Care | Payer: Medicare Other | Source: Ambulatory Visit | Attending: Internal Medicine | Admitting: Internal Medicine

## 2017-06-30 ENCOUNTER — Encounter (HOSPITAL_COMMUNITY): Payer: Self-pay

## 2017-06-30 ENCOUNTER — Other Ambulatory Visit: Payer: Self-pay | Admitting: Internal Medicine

## 2017-06-30 DIAGNOSIS — Z87891 Personal history of nicotine dependence: Secondary | ICD-10-CM | POA: Diagnosis not present

## 2017-06-30 DIAGNOSIS — M1990 Unspecified osteoarthritis, unspecified site: Secondary | ICD-10-CM | POA: Insufficient documentation

## 2017-06-30 DIAGNOSIS — C3490 Malignant neoplasm of unspecified part of unspecified bronchus or lung: Secondary | ICD-10-CM | POA: Diagnosis present

## 2017-06-30 DIAGNOSIS — Z7901 Long term (current) use of anticoagulants: Secondary | ICD-10-CM | POA: Insufficient documentation

## 2017-06-30 DIAGNOSIS — C3411 Malignant neoplasm of upper lobe, right bronchus or lung: Secondary | ICD-10-CM | POA: Insufficient documentation

## 2017-06-30 DIAGNOSIS — K219 Gastro-esophageal reflux disease without esophagitis: Secondary | ICD-10-CM | POA: Insufficient documentation

## 2017-06-30 DIAGNOSIS — F419 Anxiety disorder, unspecified: Secondary | ICD-10-CM | POA: Diagnosis not present

## 2017-06-30 DIAGNOSIS — I480 Paroxysmal atrial fibrillation: Secondary | ICD-10-CM | POA: Diagnosis not present

## 2017-06-30 DIAGNOSIS — Z8249 Family history of ischemic heart disease and other diseases of the circulatory system: Secondary | ICD-10-CM | POA: Insufficient documentation

## 2017-06-30 DIAGNOSIS — Z882 Allergy status to sulfonamides status: Secondary | ICD-10-CM | POA: Insufficient documentation

## 2017-06-30 DIAGNOSIS — Z880 Allergy status to penicillin: Secondary | ICD-10-CM | POA: Insufficient documentation

## 2017-06-30 DIAGNOSIS — Z902 Acquired absence of lung [part of]: Secondary | ICD-10-CM | POA: Diagnosis not present

## 2017-06-30 DIAGNOSIS — Z95810 Presence of automatic (implantable) cardiac defibrillator: Secondary | ICD-10-CM | POA: Diagnosis not present

## 2017-06-30 DIAGNOSIS — I4892 Unspecified atrial flutter: Secondary | ICD-10-CM | POA: Insufficient documentation

## 2017-06-30 HISTORY — DX: Personal history of antineoplastic chemotherapy: Z92.21

## 2017-06-30 HISTORY — PX: IR US GUIDE VASC ACCESS RIGHT: IMG2390

## 2017-06-30 HISTORY — PX: IR FLUORO GUIDE PORT INSERTION RIGHT: IMG5741

## 2017-06-30 HISTORY — DX: Personal history of irradiation: Z92.3

## 2017-06-30 HISTORY — DX: Presence of cardiac pacemaker: Z95.0

## 2017-06-30 HISTORY — DX: Personal history of other medical treatment: Z92.89

## 2017-06-30 LAB — BASIC METABOLIC PANEL
ANION GAP: 8 (ref 5–15)
BUN: 10 mg/dL (ref 6–20)
CALCIUM: 9.4 mg/dL (ref 8.9–10.3)
CO2: 24 mmol/L (ref 22–32)
CREATININE: 0.6 mg/dL (ref 0.44–1.00)
Chloride: 108 mmol/L (ref 101–111)
GFR calc Af Amer: >60 mL/min (ref >60)
GLUCOSE: 110 mg/dL — AB (ref 65–99)
Potassium: 4.2 mmol/L (ref 3.5–5.1)
Sodium: 140 mmol/L (ref 135–145)

## 2017-06-30 LAB — CBC WITH DIFFERENTIAL/PLATELET
BASOS ABS: 0 10*3/uL (ref 0.0–0.1)
BASOS PCT: 1 %
EOS ABS: 0.1 10*3/uL (ref 0.0–0.7)
EOS PCT: 3 %
HCT: 37.9 % (ref 36.0–46.0)
Hemoglobin: 13.5 g/dL (ref 12.0–15.0)
Lymphocytes Relative: 21 %
Lymphs Abs: 1 10*3/uL (ref 0.7–4.0)
MCH: 34 pg (ref 26.0–34.0)
MCHC: 35.6 g/dL (ref 30.0–36.0)
MCV: 95.5 fL (ref 78.0–100.0)
MONO ABS: 0.3 10*3/uL (ref 0.1–1.0)
MONOS PCT: 7 %
NEUTROS ABS: 3.2 10*3/uL (ref 1.7–7.7)
Neutrophils Relative %: 68 %
Platelets: 168 10*3/uL (ref 150–400)
RBC: 3.97 MIL/uL (ref 3.87–5.11)
RDW: 13.1 % (ref 11.5–15.5)
WBC: 4.6 10*3/uL (ref 4.0–10.5)

## 2017-06-30 LAB — PROTIME-INR
INR: 1.17
Prothrombin Time: 14.8 seconds (ref 11.4–15.2)

## 2017-06-30 MED ORDER — MIDAZOLAM HCL 2 MG/2ML IJ SOLN
INTRAMUSCULAR | Status: AC
  Filled 2017-06-30: qty 4

## 2017-06-30 MED ORDER — LIDOCAINE-EPINEPHRINE (PF) 2 %-1:200000 IJ SOLN
INTRAMUSCULAR | Status: AC | PRN
  Administered 2017-06-30: 10 mL via INTRADERMAL

## 2017-06-30 MED ORDER — SODIUM CHLORIDE 0.9 % IV SOLN: INTRAVENOUS | Status: DC

## 2017-06-30 MED ORDER — LIDOCAINE-EPINEPHRINE (PF) 2 %-1:200000 IJ SOLN
INTRAMUSCULAR | Status: AC
  Filled 2017-06-30: qty 20

## 2017-06-30 MED ORDER — FLUMAZENIL 0.5 MG/5ML IV SOLN
INTRAVENOUS | Status: AC
  Filled 2017-06-30: qty 5

## 2017-06-30 MED ORDER — HEPARIN SOD (PORK) LOCK FLUSH 100 UNIT/ML IV SOLN
INTRAVENOUS | Status: AC
  Filled 2017-06-30: qty 5

## 2017-06-30 MED ORDER — CLINDAMYCIN PHOSPHATE 900 MG/50ML IV SOLN
900.0000 mg | Freq: Once | INTRAVENOUS | Status: AC
  Administered 2017-06-30: 900 mg via INTRAVENOUS

## 2017-06-30 MED ORDER — NALOXONE HCL 0.4 MG/ML IJ SOLN
INTRAMUSCULAR | Status: AC
  Filled 2017-06-30: qty 1

## 2017-06-30 MED ORDER — FENTANYL CITRATE (PF) 100 MCG/2ML IJ SOLN
INTRAMUSCULAR | Status: AC
  Filled 2017-06-30: qty 4

## 2017-06-30 MED ORDER — FENTANYL CITRATE (PF) 100 MCG/2ML IJ SOLN
INTRAMUSCULAR | Status: AC | PRN
  Administered 2017-06-30 (×2): 50 ug via INTRAVENOUS

## 2017-06-30 MED ORDER — CLINDAMYCIN PHOSPHATE 900 MG/50ML IV SOLN
INTRAVENOUS | Status: AC
  Filled 2017-06-30: qty 50

## 2017-06-30 MED ORDER — MIDAZOLAM HCL 2 MG/2ML IJ SOLN
INTRAMUSCULAR | Status: AC | PRN
  Administered 2017-06-30 (×2): 1 mg via INTRAVENOUS

## 2017-06-30 NOTE — Sedation Documentation (Signed)
Patient denies pain and is resting comfortably.  

## 2017-06-30 NOTE — Discharge Instructions (Signed)
Moderate Conscious Sedation, Adult, Care After °These instructions provide you with information about caring for yourself after your procedure. Your health care provider may also give you more specific instructions. Your treatment has been planned according to current medical practices, but problems sometimes occur. Call your health care provider if you have any problems or questions after your procedure. °What can I expect after the procedure? °After your procedure, it is common: °· To feel sleepy for several hours. °· To feel clumsy and have poor balance for several hours. °· To have poor judgment for several hours. °· To vomit if you eat too soon. ° °Follow these instructions at home: °For at least 24 hours after the procedure: ° °· Do not: °? Participate in activities where you could fall or become injured. °? Drive. °? Use heavy machinery. °? Drink alcohol. °? Take sleeping pills or medicines that cause drowsiness. °? Make important decisions or sign legal documents. °? Take care of children on your own. °· Rest. °Eating and drinking °· Follow the diet recommended by your health care provider. °· If you vomit: °? Drink water, juice, or soup when you can drink without vomiting. °? Make sure you have little or no nausea before eating solid foods. °General instructions °· Have a responsible adult stay with you until you are awake and alert. °· Take over-the-counter and prescription medicines only as told by your health care provider. °· If you smoke, do not smoke without supervision. °· Keep all follow-up visits as told by your health care provider. This is important. °Contact a health care provider if: °· You keep feeling nauseous or you keep vomiting. °· You feel light-headed. °· You develop a rash. °· You have a fever. °Get help right away if: °· You have trouble breathing. °This information is not intended to replace advice given to you by your health care provider. Make sure you discuss any questions you have  with your health care provider. °Document Released: 05/11/2013 Document Revised: 12/24/2015 Document Reviewed: 11/10/2015 °Elsevier Interactive Patient Education © 2018 [employer] °Implanted Port Home Guide °An implanted port is a type of central line that is placed under the skin. Central lines are used to provide IV access when treatment or nutrition needs to be given through a person’s veins. Implanted ports are used for long-term IV access. An implanted port may be placed because: °· You need IV medicine that would be irritating to the small veins in your hands or arms. °· You need long-term IV medicines, such as antibiotics. °· You need IV nutrition for a long period. °· You need frequent blood draws for lab tests. °· You need dialysis. ° °Implanted ports are usually placed in the chest area, but they can also be placed in the upper arm, the abdomen, or the leg. An implanted port has two main parts: °· Reservoir. The reservoir is round and will appear as a small, raised area under your skin. The reservoir is the part where a needle is inserted to give medicines or draw blood. °· Catheter. The catheter is a thin, flexible tube that extends from the reservoir. The catheter is placed into a large vein. Medicine that is inserted into the reservoir goes into the catheter and then into the vein. ° °How will I care for my incision site? °Do not get the incision site wet. Bathe or shower as directed by your health care provider. °How is my port accessed? °Special steps must be taken to access the port: °·   Before the port is accessed, a numbing cream can be placed on the skin. This helps numb the skin over the port site. °· Your health care provider uses a sterile technique to access the port. °? Your health care provider must put on a mask and sterile gloves. °? The skin over your port is cleaned carefully with an antiseptic and allowed to dry. °? The port is gently pinched between sterile gloves, and a needle is  inserted into the port. °· Only "non-coring" port needles should be used to access the port. Once the port is accessed, a blood return should be checked. This helps ensure that the port is in the vein and is not clogged. °· If your port needs to remain accessed for a constant infusion, a clear (transparent) bandage will be placed over the needle site. The bandage and needle will need to be changed every week, or as directed by your health care provider. °· Keep the bandage covering the needle clean and dry. Do not get it wet. Follow your health care provider’s instructions on how to take a shower or bath while the port is accessed. °· If your port does not need to stay accessed, no bandage is needed over the port. ° °What is flushing? °Flushing helps keep the port from getting clogged. Follow your health care provider’s instructions on how and when to flush the port. Ports are usually flushed with saline solution or a medicine called heparin. The need for flushing will depend on how the port is used. °· If the port is used for intermittent medicines or blood draws, the port will need to be flushed: °? After medicines have been given. °? After blood has been drawn. °? As part of routine maintenance. °· If a constant infusion is running, the port may not need to be flushed. ° °How long will my port stay implanted? °The port can stay in for as long as your health care provider thinks it is needed. When it is time for the port to come out, surgery will be done to remove it. The procedure is similar to the one performed when the port was put in. °When should I seek immediate medical care? °When you have an implanted port, you should seek immediate medical care if: °· You notice a bad smell coming from the incision site. °· You have swelling, redness, or drainage at the incision site. °· You have more swelling or pain at the port site or the surrounding area. °· You have a fever that is not controlled with  medicine. ° °This information is not intended to replace advice given to you by your health care provider. Make sure you discuss any questions you have with your health care provider. °Document Released: 07/21/2005 Document Revised: 12/27/2015 Document Reviewed: 03/28/2013 °Elsevier Interactive Patient Education © 2017 [employer]. ° °

## 2017-06-30 NOTE — Consult Note (Signed)
Chief Complaint: Patient was seen in consultation today for Port-A-Cath placement  Referring Physician(s): Mohamed,Mohamed  Supervising Physician: Sandi Mariscal  Patient Status: [place]  History of Present Illness: Nancy Hodges is a 71 y.o. 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  She has been subsequently treated with chemotherapy with palliative radiotherapy to a subcarinal lymph node.  She is currently on chemo/immunotherapy.  She has poor venous access and presents today for Port-A-Cath placement for additional treatment.  Past Medical History:  Diagnosis Date  . AICD (automatic cardioverter/defibrillator) present   . Allergic rhinitis   . Anxiety   . Arthritis   . Asthma   . Atrial flutter ([place])   . Cholelithiases 09/26/2015  . Diverticulosis   . Dysrhythmia   . Encounter for antineoplastic immunotherapy 12/09/2016  . GERD (gastroesophageal reflux disease)   . H/O hiatal hernia   . History of blood transfusion   . History of chemotherapy   . History of kidney stones   . History of radiation therapy   . Internal hemorrhoid   . Lung cancer ([place])    a. Stage IB non-small cell carcinoma, s/p R VATS, wedge resection, RU lobectomy 10/2012.  . Paroxysmal atrial fibrillation ([facility])    a. anticoagulated with Xarelto  . Pneumonia 06/2016   [facility]  . Presence of permanent cardiac pacemaker   . Pulmonary nodule   . Tachycardia-bradycardia syndrome ([place])    a. 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    Past Surgical History:  Procedure Laterality Date  . CATARACT EXTRACTION W/PHACO Right 08/14/2016   Procedure: CATARACT EXTRACTION PHACO AND INTRAOCULAR LENS PLACEMENT RIGHT EYE CDE 10.53;  Surgeon: Tonny Branch, MD;  Location: [facility];  Service: Ophthalmology;  Laterality: Right;  right  . CATARACT  EXTRACTION W/PHACO Left 09/25/2016   Procedure: CATARACT EXTRACTION PHACO AND INTRAOCULAR LENS PLACEMENT (IOC);  Surgeon: Tonny Branch, MD;  Location: [facility];  Service: Ophthalmology;  Laterality: Left;  left CDE 8.81  . COLONOSCOPY     [facility]: cattered sigmoid diverticula, internal hemorrhoids, sessile polyp in ascending and mid transverse colon (tubular adenoma).   . colonoscopy with polypectomy  2015  . ESOPHAGOGASTRODUODENOSCOPY     [facility]: bile reflux in stomach, mild gastritis, hiatal hernia  . ESOPHAGOGASTRODUODENOSCOPY N/A 01/31/2016   Procedure: ESOPHAGOGASTRODUODENOSCOPY (EGD);  Surgeon: [name], MD;  Location: [facility];  Service: Endoscopy;  Laterality: N/A;  1200  . LOBECTOMY Right 10/27/2012   Procedure: LOBECTOMY;  Surgeon: [name], MD;  Location: [place];  Service: Thoracic;  Laterality: Right;   RIGHT UPPER LOBECTOMY & Node Dissection  . LYMPH NODE BIOPSY Right 05/01/2014   Procedure: LYMPH NODE BIOPSY, Right Axillary;  Surgeon: [name], MD;  Location: [place];  Service: Thoracic;  Laterality: Right;  . PARTIAL HYSTERECTOMY    . PERMANENT PACEMAKER INSERTION N/A 09/14/2012   Nanostim (SJM) leadless pacemaker (LEADLESS II STUDY PATIENT) implanted by Dr [name]  . VIDEO ASSISTED THORACOSCOPY (VATS)/WEDGE RESECTION Right 10/27/2012   Procedure: VIDEO ASSISTED THORACOSCOPY (VATS),RGHT UPPER LOBE LUNG WEDGE RESECTION;  Surgeon: [name], MD;  Location: [place];  Service: Thoracic;  Laterality: Right;    Allergies: Chlorpheniramine-dm; Prednisone; Septra [sulfamethoxazole-trimethoprim]; Penicillins; and Vioxx [rofecoxib]  Medications: Prior to Admission medications   Medication Sig Start Date End Date Taking? Authorizing Provider  acetaminophen (TYLENOL) 500 MG  tablet Take 500 mg by mouth every 6 (six) hours as needed for moderate pain or fever.    Yes [provider]  albuterol (PROVENTIL HFA;VENTOLIN HFA) 108 (90 BASE) MCG/ACT  inhaler Inhale 1-2 puffs into the lungs every 6 (six) hours as needed for wheezing or shortness of breath.   Yes [provider]  diltiazem (CARDIZEM CD) 240 MG 24 hr capsule Take 1 capsule (240 mg total) by mouth daily. 03/30/17  Yes Allred, Jeneen Rinks, MD  hydroxypropyl methylcellulose / hypromellose (ISOPTO TEARS / GONIOVISC) 2.5 % ophthalmic solution Place 1-2 drops into the left eye as needed for dry eyes.   Yes [provider]  loratadine (CLARITIN) 10 MG tablet Take 10 mg by mouth daily.   Yes [provider]  Multiple Vitamin (MULTIVITAMIN) tablet Take 1 tablet by mouth daily.   Yes [provider]  cephALEXin (KEFLEX) 500 MG capsule Take 1 capsule (500 mg total) by mouth 4 (four) times daily. 06/02/17   Milton Ferguson, MD  Chlorpheniramine-DM (CORICIDIN COUGH/COLD) 4-30 MG TABS Take 1-2 tablets by mouth daily as needed (FOR COLD SYMPTOMS).    [provider]  ipratropium-albuterol (DUONEB) 0.5-2.5 (3) MG/3ML SOLN Take 3 mLs by nebulization every 6 (six) hours as needed (shortness of breath or wheezing).     [provider]  rivaroxaban (XARELTO) 20 MG TABS tablet Take 1 tablet (20 mg total) by mouth daily with supper. 03/30/17   [name], MD     Family History  Problem Relation Age of Onset  . Heart disease Mother   . Diabetes Mother   . Kidney cancer Mother   . Asthma Father   . Heart disease Father   . Pancreatic cancer Brother   . Diabetes Sister   . Lung cancer Sister   . Diabetes Brother   . Heart attack Son   . Colon cancer Neg Hx     Social History   Socioeconomic History  . Marital status: Single    Spouse name: None  . Number of children: None  . Years of education: None  . Highest education level: None  Social Needs  . Financial resource strain: None  . Food insecurity - worry: None  . Food insecurity - inability: None  . Transportation needs - medical: None  . Transportation needs - non-medical: None    Occupational History  . None  Tobacco Use  . Smoking status: Former Smoker    Packs/day: 2.00    Years: 40.00    Pack years: 80.00    Types: Cigarettes    Last attempt to quit: 08/05/1995    Years since quitting: 21.9  . Smokeless tobacco: Never Used  Substance and Sexual Activity  . Alcohol use: No  . Drug use: No  . Sexual activity: Not Currently  Other Topics Concern  . None  Social History Narrative   Patient lives alone in [place].   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   Patient has 1 living daughter      Review of Systems currently denies fever, headache, chest pain, abdominal pain, nausea, vomiting or bleeding.  She does have some discomfort under right breast, occasional dyspnea, occasional cough.  Vital Signs: BP (!) 156/74 (BP Location: Right Arm)   Pulse 79   Temp (!) 97.5 F (36.4 C) (Oral)   Resp 16   SpO2 98%   Physical Exam awake, alert.  Chest clear to auscultation bilaterally.  Heart with regular rate and rhythm.  Abdomen  soft, positive bowel sounds, nontender.  No lower extremity edema.  Imaging: Dg Chest 2 View  Result Date: 06/02/2017 CLINICAL DATA:  Palpitations. EXAM: CHEST  2 VIEW COMPARISON:  02/18/2017 FINDINGS: Right upper lobe resection with scarring unchanged. Right basilar pleural scarring and elevated right hemidiaphragm unchanged. Chronic lung disease. Negative for heart failure pneumonia or effusion. Loop recorder. No change from prior studies. IMPRESSION: COPD with postop changes on the right.  No acute abnormality. Electronically Signed   By: Franchot Gallo M.D.   On: 06/02/2017 19:30    Labs:  CBC: Recent Labs    03/17/17 0955 04/14/17 1156 05/12/17 1020 06/09/17 0949  WBC 4.7 4.9 4.9 4.3  HGB 14.1 13.7 13.9 13.8  HCT 40.4 38.6 39.2 39.9  PLT 184 148 171 169    COAGS: No results for input(s): INR, APTT in the last 8760 hours.  BMP: Recent Labs    08/12/16 0937  02/18/17 1749 02/19/17 0458  03/17/17 0955  04/14/17 1156 05/12/17 1020 06/09/17 0949  NA 137   < > 138 141   < > 141 140 140 139  K 4.3   < > 4.0 4.1   < > 4.0 4.2 4.0 4.3  CL 107  --  102 111  --   --   --   --   --   CO2 23   < > 26 25   < > 24 25 25 25   GLUCOSE 119*   < > 188* 83   < > 115 115 111 214*  BUN 10   < > 10 8   < > 10.0 9.8 11.1 11.5  CALCIUM 9.6   < > 9.3 8.7*   < > 10.3 10.2 9.9 9.7  CREATININE 0.52   < > 0.60 0.44   < > 0.8 0.8 0.8 0.8  GFRNONAA >60  --  >60 >60  --   --   --   --   --   GFRAA >60  --  >60 >60  --   --   --   --   --    < > = values in this interval not displayed.    LIVER FUNCTION TESTS: Recent Labs    03/17/17 0955 04/14/17 1156 05/12/17 1020 06/09/17 0949  BILITOT 1.26* 1.15 1.58* 1.38*  AST 37* 41* 34 35*  ALT 31 31 25  32  ALKPHOS 123 117 113 105  PROT 7.2 7.5 7.5 7.2  ALBUMIN 3.8 4.1 4.1 3.8    TUMOR MARKERS: No results for input(s): AFPTM, CEA, CA199, CHROMGRNA in the last 8760 hours.  Assessment and Plan:  71 y.o. 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  She has been subsequently treated with chemotherapy with palliative radiotherapy to a subcarinal lymph node.  She is currently on chemo/immunotherapy.  She has poor venous access and presents today for Port-A-Cath placement for additional treatment.Risks and benefits discussed with the patient/sister including, but not limited to bleeding, infection, pneumothorax, or fibrin sheath development and need for additional procedures.All of the patient's questions were answered, patient is agreeable to proceed.Consent signed and in chart.Labs pend.    Thank you for this interesting consult.  I greatly enjoyed meeting Nancy Hodges and look forward to participating in their care.  A copy of this report was sent to the requesting provider on this date.  Electronically  Signed: D. Rowe Robert, PA-C 06/30/2017, 10:21 AM   I spent a total of  25 minutes   in face to face in clinical consultation, greater than 50% of which was counseling/coordinating care for Port-A-Cath placement

## 2017-06-30 NOTE — Procedures (Signed)
Pre Procedure Dx: Lung Cancer Post Procedural Dx: Same  Successful placement of right IJ approach port-a-cath with tip at the superior caval atrial junction. The catheter is ready for immediate use.  Estimated Blood Loss: Minimal  Complications: None immediate.  Jay Kaelie Henigan, MD Pager #: [phone]   

## 2017-07-03 ENCOUNTER — Other Ambulatory Visit: Payer: Self-pay | Admitting: *Deleted

## 2017-07-03 ENCOUNTER — Other Ambulatory Visit (HOSPITAL_COMMUNITY): Payer: Medicare Other

## 2017-07-03 ENCOUNTER — Ambulatory Visit (HOSPITAL_COMMUNITY)
Admission: RE | Admit: 2017-07-03 | Discharge: 2017-07-03 | Disposition: A | Discharge status: Home / Self Care | Payer: Medicare Other | Source: Ambulatory Visit | Attending: Internal Medicine | Admitting: Internal Medicine

## 2017-07-03 DIAGNOSIS — K7460 Unspecified cirrhosis of liver: Secondary | ICD-10-CM | POA: Insufficient documentation

## 2017-07-03 DIAGNOSIS — R161 Splenomegaly, not elsewhere classified: Secondary | ICD-10-CM | POA: Diagnosis not present

## 2017-07-03 DIAGNOSIS — C3490 Malignant neoplasm of unspecified part of unspecified bronchus or lung: Secondary | ICD-10-CM | POA: Diagnosis present

## 2017-07-03 DIAGNOSIS — I700 Atherosclerosis of aorta: Secondary | ICD-10-CM | POA: Insufficient documentation

## 2017-07-03 DIAGNOSIS — K8020 Calculus of gallbladder without cholecystitis without obstruction: Secondary | ICD-10-CM | POA: Insufficient documentation

## 2017-07-03 DIAGNOSIS — K766 Portal hypertension: Secondary | ICD-10-CM | POA: Diagnosis not present

## 2017-07-03 MED ORDER — IOPAMIDOL (ISOVUE-300) INJECTION 61%
INTRAVENOUS | Status: AC
  Filled 2017-07-03: qty 100

## 2017-07-03 MED ORDER — LIDOCAINE-PRILOCAINE 2.5-2.5 % EX CREA: 1.0000 "application " | TOPICAL_CREAM | CUTANEOUS | 4 refills | Status: DC | PRN

## 2017-07-03 MED ORDER — IOPAMIDOL (ISOVUE-300) INJECTION 61%
100.0000 mL | Freq: Once | INTRAVENOUS | Status: AC | PRN
  Administered 2017-07-03: 100 mL via INTRAVENOUS

## 2017-07-03 NOTE — Telephone Encounter (Signed)
Rx for emla cream sent to pt pharmacy

## 2017-07-07 ENCOUNTER — Encounter: Payer: Self-pay | Admitting: Internal Medicine

## 2017-07-07 ENCOUNTER — Ambulatory Visit (HOSPITAL_BASED_OUTPATIENT_CLINIC_OR_DEPARTMENT_OTHER): Payer: Medicare Other | Admitting: Internal Medicine

## 2017-07-07 ENCOUNTER — Ambulatory Visit: Payer: Medicare Other

## 2017-07-07 ENCOUNTER — Ambulatory Visit (HOSPITAL_BASED_OUTPATIENT_CLINIC_OR_DEPARTMENT_OTHER): Payer: Medicare Other

## 2017-07-07 ENCOUNTER — Other Ambulatory Visit (HOSPITAL_BASED_OUTPATIENT_CLINIC_OR_DEPARTMENT_OTHER): Payer: Medicare Other

## 2017-07-07 VITALS — BP 148/73 | HR 82 | Temp 98.7°F | Resp 20 | Ht 62.0 in | Wt 148.2 lb

## 2017-07-07 DIAGNOSIS — R5382 Chronic fatigue, unspecified: Secondary | ICD-10-CM

## 2017-07-07 DIAGNOSIS — C3491 Malignant neoplasm of unspecified part of right bronchus or lung: Secondary | ICD-10-CM

## 2017-07-07 DIAGNOSIS — C773 Secondary and unspecified malignant neoplasm of axilla and upper limb lymph nodes: Secondary | ICD-10-CM | POA: Diagnosis present

## 2017-07-07 DIAGNOSIS — C3411 Malignant neoplasm of upper lobe, right bronchus or lung: Secondary | ICD-10-CM

## 2017-07-07 DIAGNOSIS — Z9221 Personal history of antineoplastic chemotherapy: Secondary | ICD-10-CM | POA: Diagnosis not present

## 2017-07-07 DIAGNOSIS — Z5112 Encounter for antineoplastic immunotherapy: Secondary | ICD-10-CM

## 2017-07-07 DIAGNOSIS — C3490 Malignant neoplasm of unspecified part of unspecified bronchus or lung: Secondary | ICD-10-CM

## 2017-07-07 DIAGNOSIS — Z95828 Presence of other vascular implants and grafts: Secondary | ICD-10-CM | POA: Insufficient documentation

## 2017-07-07 DIAGNOSIS — Z79899 Other long term (current) drug therapy: Secondary | ICD-10-CM | POA: Diagnosis not present

## 2017-07-07 LAB — COMPREHENSIVE METABOLIC PANEL
ALK PHOS: 118 U/L (ref 40–150)
ALT: 41 U/L (ref 0–55)
ANION GAP: 7 meq/L (ref 3–11)
AST: 50 U/L — ABNORMAL HIGH (ref 5–34)
Albumin: 3.9 g/dL (ref 3.5–5.0)
BILIRUBIN TOTAL: 1.18 mg/dL (ref 0.20–1.20)
BUN: 9 mg/dL (ref 7.0–26.0)
CO2: 27 meq/L (ref 22–29)
Calcium: 10.1 mg/dL (ref 8.4–10.4)
Chloride: 106 mEq/L (ref 98–109)
Creatinine: 0.8 mg/dL (ref 0.6–1.1)
Glucose: 120 mg/dl (ref 70–140)
Potassium: 4.2 mEq/L (ref 3.5–5.1)
Sodium: 140 mEq/L (ref 136–145)
TOTAL PROTEIN: 7.2 g/dL (ref 6.4–8.3)

## 2017-07-07 LAB — CBC WITH DIFFERENTIAL/PLATELET
BASO%: 0.8 % (ref 0.0–2.0)
BASOS ABS: 0 10*3/uL (ref 0.0–0.1)
EOS ABS: 0.1 10*3/uL (ref 0.0–0.5)
EOS%: 1.9 % (ref 0.0–7.0)
HCT: 37.5 % (ref 34.8–46.6)
HGB: 13.1 g/dL (ref 11.6–15.9)
LYMPH%: 17.6 % (ref 14.0–49.7)
MCH: 33.5 pg (ref 25.1–34.0)
MCHC: 34.8 g/dL (ref 31.5–36.0)
MCV: 96.3 fL (ref 79.5–101.0)
MONO#: 0.4 10*3/uL (ref 0.1–0.9)
MONO%: 8.4 % (ref 0.0–14.0)
NEUT%: 71.3 % (ref 38.4–76.8)
NEUTROS ABS: 3.4 10*3/uL (ref 1.5–6.5)
PLATELETS: 184 10*3/uL (ref 145–400)
RBC: 3.9 10*6/uL (ref 3.70–5.45)
RDW: 13.1 % (ref 11.2–14.5)
WBC: 4.8 10*3/uL (ref 3.9–10.3)
lymph#: 0.9 10*3/uL (ref 0.9–3.3)

## 2017-07-07 LAB — TSH: TSH: 3.005 m(IU)/L (ref 0.308–3.960)

## 2017-07-07 MED ORDER — SODIUM CHLORIDE 0.9 % IV SOLN
Freq: Once | INTRAVENOUS | Status: AC
  Administered 2017-07-07: 12:00:00 via INTRAVENOUS

## 2017-07-07 MED ORDER — SODIUM CHLORIDE 0.9 % IV SOLN
480.0000 mg | Freq: Once | INTRAVENOUS | Status: AC
  Administered 2017-07-07: 480 mg via INTRAVENOUS
  Filled 2017-07-07: qty 48

## 2017-07-07 MED ORDER — HEPARIN SOD (PORK) LOCK FLUSH 100 UNIT/ML IV SOLN
500.0000 [IU] | Freq: Once | INTRAVENOUS | Status: AC | PRN
  Administered 2017-07-07: 500 [IU]
  Filled 2017-07-07: qty 5

## 2017-07-07 MED ORDER — SODIUM CHLORIDE 0.9% FLUSH
10.0000 mL | INTRAVENOUS | Status: DC | PRN
  Administered 2017-07-07: 10 mL
  Filled 2017-07-07: qty 10

## 2017-07-07 MED ORDER — SODIUM CHLORIDE 0.9% FLUSH
10.0000 mL | INTRAVENOUS | Status: DC | PRN
  Administered 2017-07-07: 10 mL via INTRAVENOUS
  Filled 2017-07-07: qty 10

## 2017-07-07 NOTE — Progress Notes (Signed)
[place] [phone]   [phone]  OFFICE PROGRESS NOTE  [name] [address] [place] [address]  DIAGNOSIS: Metastatic non-small cell lung cancer, adenocarcinoma. This was initially diagnosed as stage IB (T2a, N0, M0) in March of 2014, status post right upper lobectomy with lymph node dissection but the patient has evidence for disease recurrence in the right axilla status post resection.  PRIOR THERAPY:  1) Status post right axillary lymph node biopsy on 05/01/2014 under the care of Dr. Roxan Hockey. 2) Systemic chemotherapy with carboplatin for AUC of 5 and Alimta 500 mg/M2 every 3 weeks. First cycle 05/31/2014. Status post 6 cycles. Last dose was given 09/22/2014. 3) palliative radiotherapy to the subcarinal lymph node under the care of Dr. Sondra Come. 4) Nivolumab 240 mg IV every 2 weeks. First dose 12/23/2016. Status post 6 cycles.  CURRENT THERAPY:  Nivolumab 480 mg IV every 4 weeks. First dose 03/17/2017. Status post 4 cycles.  INTERVAL HISTORY: Nancy Hodges 71 y.o. female returns to the clinic today for follow-up visit.  The patient is feeling fine today with no specific complaints except for pain in the right shoulder.  She denied having any recent chest pain, shortness of breath, cough or hemoptysis.  She denied having any weight loss or night sweats.  She has no nausea, vomiting, diarrhea or constipation.  The patient continues to tolerate her treatment with Nivolumab fairly well.  She is here for evaluation after repeating CT scan of the chest, abdomen pelvis for restaging of her disease.   MEDICAL HISTORY: Past Medical History:  Diagnosis Date  . AICD (automatic cardioverter/defibrillator) present   . Allergic rhinitis   . Anxiety   . Arthritis   . Asthma   . Atrial flutter ([facility])   . Cholelithiases 09/26/2015  . Diverticulosis   . Dysrhythmia   . Encounter for antineoplastic immunotherapy 12/09/2016  .  GERD (gastroesophageal reflux disease)   . H/O hiatal hernia   . History of blood transfusion   . History of chemotherapy   . History of kidney stones   . History of radiation therapy   . Internal hemorrhoid   . Lung cancer ([facility])    a. Stage IB non-small cell carcinoma, s/p R VATS, wedge resection, RU lobectomy 10/2012.  . Paroxysmal atrial fibrillation ([facility])    a. anticoagulated with Xarelto  . Pneumonia 06/2016   [facility]  . Presence of permanent cardiac pacemaker   . Pulmonary nodule   . 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    ALLERGIES:  is allergic to chlorpheniramine-dm; prednisone; septra [sulfamethoxazole-trimethoprim]; penicillins; and vioxx [rofecoxib].  MEDICATIONS:  Current Outpatient Medications  Medication Sig Dispense Refill  . acetaminophen (TYLENOL) 500 MG tablet Take 500 mg by mouth every 6 (six) hours as needed for moderate pain or fever.     [name] albuterol (PROVENTIL HFA;VENTOLIN HFA) 108 (90 BASE) MCG/ACT inhaler Inhale 1-2 puffs into the lungs every 6 (six) hours as needed for wheezing or shortness of breath.    . cephALEXin (KEFLEX) 500 MG capsule Take 1 capsule (500 mg total) by mouth 4 (four) times daily. 28 capsule 0  . Chlorpheniramine-DM (CORICIDIN COUGH/COLD) 4-30 MG TABS Take 1-2 tablets by mouth daily as needed (FOR COLD SYMPTOMS).    [name] diltiazem (CARDIZEM CD) 240 MG 24 hr capsule Take 1 capsule (240 mg total) by mouth daily. 90 capsule 3  . hydroxypropyl methylcellulose /  hypromellose (ISOPTO TEARS / GONIOVISC) 2.5 % ophthalmic solution Place 1-2 drops into the left eye as needed for dry eyes.    [name] ipratropium-albuterol (DUONEB) 0.5-2.5 (3) MG/3ML SOLN Take 3 mLs by nebulization every 6 (six) hours as needed (shortness of breath or wheezing).     [name] lidocaine-prilocaine (EMLA) cream Apply 1 application topically as needed. 30 g 4  . loratadine (CLARITIN) 10 MG tablet Take 10 mg by mouth daily.    .  Multiple Vitamin (MULTIVITAMIN) tablet Take 1 tablet by mouth daily.    . rivaroxaban (XARELTO) 20 MG TABS tablet Take 1 tablet (20 mg total) by mouth daily with supper. 30 tablet 11   No current facility-administered medications for this visit.     SURGICAL HISTORY:  Past Surgical History:  Procedure Laterality Date  . CATARACT EXTRACTION W/PHACO Right 08/14/2016   Procedure: CATARACT EXTRACTION PHACO AND INTRAOCULAR LENS PLACEMENT RIGHT EYE CDE 10.53;  Surgeon: Tonny Branch, MD;  Location: [facility];  Service: Ophthalmology;  Laterality: Right;  right  . CATARACT EXTRACTION W/PHACO Left 09/25/2016   Procedure: CATARACT EXTRACTION PHACO AND INTRAOCULAR LENS PLACEMENT (IOC);  Surgeon: Tonny Branch, MD;  Location: [facility];  Service: Ophthalmology;  Laterality: Left;  left CDE 8.81  . COLONOSCOPY     [facility]: cattered sigmoid diverticula, internal hemorrhoids, sessile polyp in ascending and mid transverse colon (tubular adenoma).   . colonoscopy with polypectomy  2015  . ESOPHAGOGASTRODUODENOSCOPY     [facility]: bile reflux in stomach, mild gastritis, hiatal hernia  . ESOPHAGOGASTRODUODENOSCOPY N/A 01/31/2016   Procedure: ESOPHAGOGASTRODUODENOSCOPY (EGD);  Surgeon: [name], MD;  Location: [facility];  Service: Endoscopy;  Laterality: N/A;  1200  . IR FLUORO GUIDE PORT INSERTION RIGHT  06/30/2017  . IR US GUIDE VASC ACCESS RIGHT  06/30/2017  . LOBECTOMY Right 10/27/2012   Procedure: LOBECTOMY;  Surgeon: [name], MD;  Location: [facility];  Service: Thoracic;  Laterality: Right;   RIGHT UPPER LOBECTOMY & Node Dissection  . LYMPH NODE BIOPSY Right 05/01/2014   Procedure: LYMPH NODE BIOPSY, Right Axillary;  Surgeon: [name], MD;  Location: [facility];  Service: Thoracic;  Laterality: Right;  . PARTIAL HYSTERECTOMY    . PERMANENT PACEMAKER INSERTION N/A 09/14/2012   Nanostim (SJM) leadless pacemaker (LEADLESS II STUDY PATIENT) implanted by Dr [name]  . VIDEO ASSISTED THORACOSCOPY  (VATS)/WEDGE RESECTION Right 10/27/2012   Procedure: VIDEO ASSISTED THORACOSCOPY (VATS),RGHT UPPER LOBE LUNG WEDGE RESECTION;  Surgeon: [name], MD;  Location: [facility];  Service: Thoracic;  Laterality: Right;    REVIEW OF SYSTEMS:  Constitutional: positive for fatigue Eyes: negative Ears, nose, mouth, throat, and face: negative Respiratory: negative Cardiovascular: negative Gastrointestinal: negative Genitourinary:negative Integument/breast: negative Hematologic/lymphatic: negative Musculoskeletal:positive for arthralgias Neurological: negative Behavioral/Psych: negative Endocrine: negative Allergic/Immunologic: negative   PHYSICAL EXAMINATION: General appearance: alert, cooperative, fatigued and no distress Head: Normocephalic, without obvious abnormality, atraumatic Neck: no adenopathy, no JVD, supple, symmetrical, trachea midline and thyroid not enlarged, symmetric, no tenderness/mass/nodules Lymph nodes: Cervical, supraclavicular, and axillary nodes normal. Resp: clear to auscultation bilaterally Back: symmetric, no curvature. ROM normal. No CVA tenderness. Cardio: regular rate and rhythm, S1, S2 normal, no murmur, click, rub or gallop GI: soft, non-tender; bowel sounds normal; no masses,  no organomegaly Extremities: extremities normal, atraumatic, no cyanosis or edema Neurologic: Alert and oriented X 3, normal strength and tone. Normal symmetric reflexes. Normal coordination and gait  ECOG PERFORMANCE STATUS: 1 - Symptomatic but completely ambulatory  Blood pressure [name])  148/73, pulse 82, temperature 98.7 F (37.1 C), temperature source Oral, resp. rate 20, height _0  (1.575 m), weight 148 lb 3.2 oz (67.2 kg), SpO2 98 %.  LABORATORY DATA: Lab Results  Component Value Date   WBC 4.8 07/07/2017   HGB 13.1 07/07/2017   HCT 37.5 07/07/2017   MCV 96.3 07/07/2017   PLT 184 07/07/2017      Chemistry      Component Value Date/Time   NA 140 07/07/2017 1008   K  4.2 07/07/2017 1008   CL 108 06/30/2017 1052   CO2 27 07/07/2017 1008   BUN 9.0 07/07/2017 1008   CREATININE 0.8 07/07/2017 1008   GLU 134 (H) 02/18/2017 1410      Component Value Date/Time   CALCIUM 10.1 07/07/2017 1008   ALKPHOS 118 07/07/2017 1008   AST 50 (H) 07/07/2017 1008   ALT 41 07/07/2017 1008   BILITOT 1.18 07/07/2017 1008       RADIOGRAPHIC STUDIES: Ct Chest W Contrast  Result Date: 07/03/2017 CLINICAL DATA:  Restaging metastatic non-small cell lung cancer. EXAM: CT CHEST, ABDOMEN, AND PELVIS WITH CONTRAST TECHNIQUE: Multidetector CT imaging of the chest, abdomen and pelvis was performed following the standard protocol during bolus administration of intravenous contrast. CONTRAST:  100 cc Isovue-300 COMPARISON:  Multiple prior examinations. The most recent is 04/10/2017 FINDINGS: CT CHEST FINDINGS Cardiovascular: The heart is normal in size. No pericardial effusion. Stable calcified metallic fragment noted in the wall of the right ventricle, likely an old pacer lead. Stable tortuosity and calcification of the thoracic aorta but no dissection or focal aneurysm. Stable coronary artery calcifications. Mediastinum/Nodes: No mediastinal or hilar mass or lymphadenopathy. The esophagus is grossly normal. Small hiatal hernia. Lungs/Pleura: Stable loss of volume in the right lung due to prior lobectomy possible radiation changes also involving the right paramediastinal long. No CT findings to suggest recurrent tumor. No metastatic pulmonary nodules. No acute pulmonary process. No pleural effusion. Stable emphysematous changes. Musculoskeletal: No breast masses, supraclavicular or axillary adenopathy. The right-sided Port-A-Cath is stable. No significant bony findings. CT ABDOMEN PELVIS FINDINGS Hepatobiliary: No focal hepatic lesions to suggest metastatic disease. Stable contour abnormality of the liver, prominent hepatic fissures and increased caudate to right lobe ratio consistent with  cirrhosis. There are also portal venous collaterals and splenomegaly. Gallstones are again noted the gallbladder. Pancreas: No mass, inflammation or ductal dilatation. Spleen: Splenomegaly and small stable cysts. Adrenals/Urinary Tract: The adrenal glands and kidneys are unremarkable and stable. Renal cortical scarring changes are noted. No ureteral or bladder calculi. No bladder mass. Stomach/Bowel: The stomach, duodenum, small bowel and colon are unremarkable. No acute inflammatory changes, mass lesions or obstructive findings. The terminal ileum and appendix are normal. Sigmoid diverticulosis without evidence of acute diverticulitis. Vascular/Lymphatic: Upper abdominal/ periportal lymph nodes likely due to the patient's cirrhosis. No retroperitoneal mass or adenopathy. Stable advanced atherosclerotic calcifications involving the aorta and branch vessels. No aneurysm. The major venous structures are patent. Reproductive: The uterus is surgically absent. Both ovaries are still present and appear normal. Other: No pelvic mass or adenopathy. No free pelvic fluid collections. No inguinal mass or adenopathy. No abdominal wall hernia or subcutaneous lesions. Musculoskeletal: No worrisome bone lesions to suggest metastatic disease. IMPRESSION: 1. Stable surgical and radiation changes involving the right lung. No findings suspicious for recurrent tumor or metastatic pulmonary disease. No mediastinal or hilar mass or adenopathy. 2. Stable cirrhotic changes involving the liver with portal venous hypertension, portal venous collaterals and splenomegaly. 3. No  findings for abdominal/pelvic metastatic disease or osseous metastatic disease. 4. Advanced atherosclerotic calcifications involving the thoracic and abdominal aorta and branch vessels. 5. Cholelithiasis. Electronically Signed   By: Marijo Sanes M.D.   On: 07/03/2017 15:25   Ct Abdomen Pelvis W Contrast  Result Date: 07/03/2017 CLINICAL DATA:  Restaging metastatic  non-small cell lung cancer. EXAM: CT CHEST, ABDOMEN, AND PELVIS WITH CONTRAST TECHNIQUE: Multidetector CT imaging of the chest, abdomen and pelvis was performed following the standard protocol during bolus administration of intravenous contrast. CONTRAST:  100 cc Isovue-300 COMPARISON:  Multiple prior examinations. The most recent is 04/10/2017 FINDINGS: CT CHEST FINDINGS Cardiovascular: The heart is normal in size. No pericardial effusion. Stable calcified metallic fragment noted in the wall of the right ventricle, likely an old pacer lead. Stable tortuosity and calcification of the thoracic aorta but no dissection or focal aneurysm. Stable coronary artery calcifications. Mediastinum/Nodes: No mediastinal or hilar mass or lymphadenopathy. The esophagus is grossly normal. Small hiatal hernia. Lungs/Pleura: Stable loss of volume in the right lung due to prior lobectomy possible radiation changes also involving the right paramediastinal long. No CT findings to suggest recurrent tumor. No metastatic pulmonary nodules. No acute pulmonary process. No pleural effusion. Stable emphysematous changes. Musculoskeletal: No breast masses, supraclavicular or axillary adenopathy. The right-sided Port-A-Cath is stable. No significant bony findings. CT ABDOMEN PELVIS FINDINGS Hepatobiliary: No focal hepatic lesions to suggest metastatic disease. Stable contour abnormality of the liver, prominent hepatic fissures and increased caudate to right lobe ratio consistent with cirrhosis. There are also portal venous collaterals and splenomegaly. Gallstones are again noted the gallbladder. Pancreas: No mass, inflammation or ductal dilatation. Spleen: Splenomegaly and small stable cysts. Adrenals/Urinary Tract: The adrenal glands and kidneys are unremarkable and stable. Renal cortical scarring changes are noted. No ureteral or bladder calculi. No bladder mass. Stomach/Bowel: The stomach, duodenum, small bowel and colon are unremarkable. No  acute inflammatory changes, mass lesions or obstructive findings. The terminal ileum and appendix are normal. Sigmoid diverticulosis without evidence of acute diverticulitis. Vascular/Lymphatic: Upper abdominal/ periportal lymph nodes likely due to the patient's cirrhosis. No retroperitoneal mass or adenopathy. Stable advanced atherosclerotic calcifications involving the aorta and branch vessels. No aneurysm. The major venous structures are patent. Reproductive: The uterus is surgically absent. Both ovaries are still present and appear normal. Other: No pelvic mass or adenopathy. No free pelvic fluid collections. No inguinal mass or adenopathy. No abdominal wall hernia or subcutaneous lesions. Musculoskeletal: No worrisome bone lesions to suggest metastatic disease. IMPRESSION: 1. Stable surgical and radiation changes involving the right lung. No findings suspicious for recurrent tumor or metastatic pulmonary disease. No mediastinal or hilar mass or adenopathy. 2. Stable cirrhotic changes involving the liver with portal venous hypertension, portal venous collaterals and splenomegaly. 3. No findings for abdominal/pelvic metastatic disease or osseous metastatic disease. 4. Advanced atherosclerotic calcifications involving the thoracic and abdominal aorta and branch vessels. 5. Cholelithiasis. Electronically Signed   By: Marijo Sanes M.D.   On: 07/03/2017 15:25   Ir US Guide Vasc Access Right  Result Date: 06/30/2017 INDICATION: History of lung cancer. In need of durable intravenous access for chemotherapy administration. EXAM: IMPLANTED PORT A CATH PLACEMENT WITH ULTRASOUND AND FLUOROSCOPIC GUIDANCE COMPARISON:  None. MEDICATIONS: Clindamycin 900 mg IV; The antibiotic was administered within an appropriate time interval prior to skin puncture. ANESTHESIA/SEDATION: Moderate (conscious) sedation was employed during this procedure. A total of Versed 2 mg and Fentanyl 100 mcg was administered intravenously. Moderate  Sedation Time: 25 minutes. The patient's  level of consciousness and vital signs were monitored continuously by radiology nursing throughout the procedure under my direct supervision. CONTRAST:  None FLUOROSCOPY TIME:  49 seconds (7 mGy) COMPLICATIONS: None immediate. PROCEDURE: The procedure, risks, benefits, and alternatives were explained to the patient. Questions regarding the procedure were encouraged and answered. The patient understands and consents to the procedure. The right neck and chest were prepped with chlorhexidine in a sterile fashion, and a sterile drape was applied covering the operative field. Maximum barrier sterile technique with sterile gowns and gloves were used for the procedure. A timeout was performed prior to the initiation of the procedure. Local anesthesia was provided with 1% lidocaine with epinephrine. After creating a small venotomy incision, a micropuncture kit was utilized to access the internal jugular vein. Real-time ultrasound guidance was utilized for vascular access including the acquisition of a permanent ultrasound image documenting patency of the accessed vessel. The microwire was utilized to measure appropriate catheter length. A subcutaneous port pocket was then created along the upper chest wall utilizing a combination of sharp and blunt dissection. The pocket was irrigated with sterile saline. A single lumen thin power injectable port was chosen for placement. The 8 Fr catheter was tunneled from the port pocket site to the venotomy incision. The port was placed in the pocket. The external catheter was trimmed to appropriate length. At the venotomy, an 8 Fr peel-away sheath was placed over a guidewire under fluoroscopic guidance. The catheter was then placed through the sheath and the sheath was removed. Final catheter positioning was confirmed and documented with a fluoroscopic spot radiograph. The port was accessed with a Huber needle, aspirated and flushed with  heparinized saline. The venotomy site was closed with an interrupted 4-0 Vicryl suture. The port pocket incision was closed with interrupted 2-0 Vicryl suture and the skin was opposed with a running subcuticular 4-0 Vicryl suture. Dermabond and Steri-strips were applied to both incisions. Dressings were placed. The patient tolerated the procedure well without immediate post procedural complication. FINDINGS: After catheter placement, the tip lies within the superior cavoatrial junction. The catheter aspirates and flushes normally and is ready for immediate use. IMPRESSION: Successful placement of a right internal jugular approach power injectable Port-A-Cath. The catheter is ready for immediate use. Electronically Signed   By: Sandi Mariscal M.D.   On: 06/30/2017 12:38   Ir Fluoro Guide Port Insertion Right  Result Date: 06/30/2017 INDICATION: History of lung cancer. In need of durable intravenous access for chemotherapy administration. EXAM: IMPLANTED PORT A CATH PLACEMENT WITH ULTRASOUND AND FLUOROSCOPIC GUIDANCE COMPARISON:  None. MEDICATIONS: Clindamycin 900 mg IV; The antibiotic was administered within an appropriate time interval prior to skin puncture. ANESTHESIA/SEDATION: Moderate (conscious) sedation was employed during this procedure. A total of Versed 2 mg and Fentanyl 100 mcg was administered intravenously. Moderate Sedation Time: 25 minutes. The patient's level of consciousness and vital signs were monitored continuously by radiology nursing throughout the procedure under my direct supervision. CONTRAST:  None FLUOROSCOPY TIME:  49 seconds (7 mGy) COMPLICATIONS: None immediate. PROCEDURE: The procedure, risks, benefits, and alternatives were explained to the patient. Questions regarding the procedure were encouraged and answered. The patient understands and consents to the procedure. The right neck and chest were prepped with chlorhexidine in a sterile fashion, and a sterile drape was applied covering  the operative field. Maximum barrier sterile technique with sterile gowns and gloves were used for the procedure. A timeout was performed prior to the initiation of the procedure. Local anesthesia  was provided with 1% lidocaine with epinephrine. After creating a small venotomy incision, a micropuncture kit was utilized to access the internal jugular vein. Real-time ultrasound guidance was utilized for vascular access including the acquisition of a permanent ultrasound image documenting patency of the accessed vessel. The microwire was utilized to measure appropriate catheter length. A subcutaneous port pocket was then created along the upper chest wall utilizing a combination of sharp and blunt dissection. The pocket was irrigated with sterile saline. A single lumen thin power injectable port was chosen for placement. The 8 Fr catheter was tunneled from the port pocket site to the venotomy incision. The port was placed in the pocket. The external catheter was trimmed to appropriate length. At the venotomy, an 8 Fr peel-away sheath was placed over a guidewire under fluoroscopic guidance. The catheter was then placed through the sheath and the sheath was removed. Final catheter positioning was confirmed and documented with a fluoroscopic spot radiograph. The port was accessed with a Huber needle, aspirated and flushed with heparinized saline. The venotomy site was closed with an interrupted 4-0 Vicryl suture. The port pocket incision was closed with interrupted 2-0 Vicryl suture and the skin was opposed with a running subcuticular 4-0 Vicryl suture. Dermabond and Steri-strips were applied to both incisions. Dressings were placed. The patient tolerated the procedure well without immediate post procedural complication. FINDINGS: After catheter placement, the tip lies within the superior cavoatrial junction. The catheter aspirates and flushes normally and is ready for immediate use. IMPRESSION: Successful placement of a  right internal jugular approach power injectable Port-A-Cath. The catheter is ready for immediate use. Electronically Signed   By: Sandi Mariscal M.D.   On: 06/30/2017 12:38   ASSESSMENT AND PLAN:  This is a very pleasant 71 years old white female with metastatic non-small cell lung cancer, adenocarcinoma with no actionable mutations status post systemic chemotherapy with carboplatin and Alimta and she is currently undergoing treatment with second line immunotherapy with Nivolumab status post 6 cycles. This was followed by immunotherapy with Nivolumab 480 mg IV every 4 weeks status post 4 cycles. The patient tolerated the last cycle of her treatment fairly well with no significant adverse effects. She had repeat CT scan of the chest, abdomen and pelvis that showed no evidence for disease progression.  I personally and independently reviewed the scan and discussed the results with the patient today. I recommended for her to proceed with cycle #5 of her immunotherapy today. I would see the patient back for follow-up visit in 4 weeks for evaluation before the next cycle of her treatment. The patient was advised to call immediately if she has any concerning symptoms in the interval. The patient voices understanding of current disease status and treatment options and is in agreement with the current care plan. All questions were answered. The patient knows to call the clinic with any problems, questions or concerns. We can certainly see the patient much sooner if necessary.  Disclaimer: This note was dictated with voice recognition software. Similar sounding words can inadvertently be transcribed and may not be corrected upon review.

## 2017-07-07 NOTE — Patient Instructions (Signed)
[facility] Discharge Instructions for Patients Receiving Chemotherapy  Today you received the following chemotherapy agents: Nivolumab  To help prevent nausea and vomiting after your treatment, we encourage you to take your nausea medication as directed.    If you develop nausea and vomiting that is not controlled by your nausea medication, call the clinic.   BELOW ARE SYMPTOMS THAT SHOULD BE REPORTED IMMEDIATELY:  *FEVER GREATER THAN 100.5 F  *CHILLS WITH OR WITHOUT FEVER  NAUSEA AND VOMITING THAT IS NOT CONTROLLED WITH YOUR NAUSEA MEDICATION  *UNUSUAL SHORTNESS OF BREATH  *UNUSUAL BRUISING OR BLEEDING  TENDERNESS IN MOUTH AND THROAT WITH OR WITHOUT PRESENCE OF ULCERS  *URINARY PROBLEMS  *BOWEL PROBLEMS  UNUSUAL RASH Items with * indicate a potential emergency and should be followed up as soon as possible.  Feel free to call the clinic should you have any questions or concerns. The clinic phone number is [phone].  Please show the CHEMO ALERT CARD at check-in to the Emergency Department and triage nurse.   

## 2017-07-08 ENCOUNTER — Telehealth: Payer: Self-pay | Admitting: Internal Medicine

## 2017-07-08 NOTE — Telephone Encounter (Signed)
Scheduled appt per 12/4 sch message - patient is aware of appt date and time. 

## 2017-07-10 ENCOUNTER — Telehealth: Payer: Self-pay | Admitting: *Deleted
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
## 2017-08-05 ENCOUNTER — Ambulatory Visit: Payer: Medicare Other

## 2017-08-05 ENCOUNTER — Encounter: Payer: Self-pay | Admitting: Internal Medicine

## 2017-08-05 ENCOUNTER — Ambulatory Visit (HOSPITAL_BASED_OUTPATIENT_CLINIC_OR_DEPARTMENT_OTHER): Payer: Medicare Other

## 2017-08-05 ENCOUNTER — Other Ambulatory Visit: Payer: Medicare Other

## 2017-08-05 ENCOUNTER — Other Ambulatory Visit (HOSPITAL_BASED_OUTPATIENT_CLINIC_OR_DEPARTMENT_OTHER): Payer: Medicare Other

## 2017-08-05 ENCOUNTER — Telehealth: Payer: Self-pay | Admitting: Internal Medicine

## 2017-08-05 ENCOUNTER — Ambulatory Visit (HOSPITAL_BASED_OUTPATIENT_CLINIC_OR_DEPARTMENT_OTHER): Payer: Medicare Other | Admitting: Internal Medicine

## 2017-08-05 DIAGNOSIS — Z79899 Other long term (current) drug therapy: Secondary | ICD-10-CM | POA: Diagnosis not present

## 2017-08-05 DIAGNOSIS — C3491 Malignant neoplasm of unspecified part of right bronchus or lung: Secondary | ICD-10-CM

## 2017-08-05 DIAGNOSIS — R5382 Chronic fatigue, unspecified: Secondary | ICD-10-CM

## 2017-08-05 DIAGNOSIS — C3411 Malignant neoplasm of upper lobe, right bronchus or lung: Secondary | ICD-10-CM

## 2017-08-05 DIAGNOSIS — C773 Secondary and unspecified malignant neoplasm of axilla and upper limb lymph nodes: Secondary | ICD-10-CM | POA: Diagnosis present

## 2017-08-05 DIAGNOSIS — Z95828 Presence of other vascular implants and grafts: Secondary | ICD-10-CM

## 2017-08-05 DIAGNOSIS — Z5112 Encounter for antineoplastic immunotherapy: Secondary | ICD-10-CM

## 2017-08-05 DIAGNOSIS — C3490 Malignant neoplasm of unspecified part of unspecified bronchus or lung: Secondary | ICD-10-CM

## 2017-08-05 LAB — CBC WITH DIFFERENTIAL/PLATELET
BASO%: 0.4 % (ref 0.0–2.0)
Basophils Absolute: 0 10*3/uL (ref 0.0–0.1)
EOS ABS: 0.1 10*3/uL (ref 0.0–0.5)
EOS%: 1.2 % (ref 0.0–7.0)
HEMATOCRIT: 35.5 % (ref 34.8–46.6)
HEMOGLOBIN: 12.4 g/dL (ref 11.6–15.9)
LYMPH#: 0.6 10*3/uL — AB (ref 0.9–3.3)
LYMPH%: 12.8 % — ABNORMAL LOW (ref 14.0–49.7)
MCH: 33.8 pg (ref 25.1–34.0)
MCHC: 35 g/dL (ref 31.5–36.0)
MCV: 96.5 fL (ref 79.5–101.0)
MONO#: 0.4 10*3/uL (ref 0.1–0.9)
MONO%: 7.2 % (ref 0.0–14.0)
NEUT%: 78.4 % — ABNORMAL HIGH (ref 38.4–76.8)
NEUTROS ABS: 3.9 10*3/uL (ref 1.5–6.5)
Platelets: 165 10*3/uL (ref 145–400)
RBC: 3.68 10*6/uL — ABNORMAL LOW (ref 3.70–5.45)
RDW: 13.2 % (ref 11.2–14.5)
WBC: 5 10*3/uL (ref 3.9–10.3)

## 2017-08-05 LAB — COMPREHENSIVE METABOLIC PANEL
ALT: 34 U/L (ref 0–55)
ANION GAP: 8 meq/L (ref 3–11)
AST: 33 U/L (ref 5–34)
Albumin: 3.7 g/dL (ref 3.5–5.0)
Alkaline Phosphatase: 111 U/L (ref 40–150)
BUN: 10.1 mg/dL (ref 7.0–26.0)
CALCIUM: 9.5 mg/dL (ref 8.4–10.4)
CHLORIDE: 104 meq/L (ref 98–109)
CO2: 28 mEq/L (ref 22–29)
CREATININE: 0.8 mg/dL (ref 0.6–1.1)
Glucose: 198 mg/dl — ABNORMAL HIGH (ref 70–140)
Potassium: 4.1 mEq/L (ref 3.5–5.1)
Sodium: 139 mEq/L (ref 136–145)
Total Bilirubin: 1.39 mg/dL — ABNORMAL HIGH (ref 0.20–1.20)
Total Protein: 6.7 g/dL (ref 6.4–8.3)

## 2017-08-05 LAB — TSH: TSH: 1.792 m[IU]/L (ref 0.308–3.960)

## 2017-08-05 MED ORDER — SODIUM CHLORIDE 0.9 % IV SOLN
480.0000 mg | Freq: Once | INTRAVENOUS | Status: AC
  Administered 2017-08-05: 480 mg via INTRAVENOUS
  Filled 2017-08-05: qty 48

## 2017-08-05 MED ORDER — SODIUM CHLORIDE 0.9% FLUSH
10.0000 mL | INTRAVENOUS | Status: DC | PRN
  Administered 2017-08-05: 10 mL via INTRAVENOUS
  Filled 2017-08-05: qty 10

## 2017-08-05 MED ORDER — SODIUM CHLORIDE 0.9% FLUSH
10.0000 mL | INTRAVENOUS | Status: DC | PRN
  Administered 2017-08-05: 10 mL
  Filled 2017-08-05: qty 10

## 2017-08-05 MED ORDER — SODIUM CHLORIDE 0.9 % IV SOLN
Freq: Once | INTRAVENOUS | Status: AC
  Administered 2017-08-05: 11:00:00 via INTRAVENOUS

## 2017-08-05 MED ORDER — HEPARIN SOD (PORK) LOCK FLUSH 100 UNIT/ML IV SOLN
500.0000 [IU] | Freq: Once | INTRAVENOUS | Status: AC | PRN
  Administered 2017-08-05: 500 [IU]
  Filled 2017-08-05: qty 5

## 2017-08-05 NOTE — Progress Notes (Signed)
[facility] [phone]   [phone]  OFFICE PROGRESS NOTE  [name] [address] [place] [address]  DIAGNOSIS: Metastatic non-small cell lung cancer, adenocarcinoma. This was initially diagnosed as stage IB (T2a, N0, M0) in March of 2014, status post right upper lobectomy with lymph node dissection but the patient has evidence for disease recurrence in the right axilla status post resection.  PRIOR THERAPY:  1) Status post right axillary lymph node biopsy on 05/01/2014 under the care of Dr. Roxan Hockey. 2) Systemic chemotherapy with carboplatin for AUC of 5 and Alimta 500 mg/M2 every 3 weeks. First cycle 05/31/2014. Status post 6 cycles. Last dose was given 09/22/2014. 3) palliative radiotherapy to the subcarinal lymph node under the care of Dr. Sondra Come. 4) Nivolumab 240 mg IV every 2 weeks. First dose 12/23/2016. Status post 6 cycles.  CURRENT THERAPY:  Nivolumab 480 mg IV every 4 weeks. First dose 03/17/2017. Status post 5 cycles.  INTERVAL HISTORY: Nancy Hodges 72 y.o. female returns to the clinic today for follow-up visit.  The patient is feeling fine today with no specific complaints except for mild fatigue.  She continues to tolerate this treatment with Nivolumab every 4 weeks fairly well.  She denied having any chest pain, shortness of breath, cough or hemoptysis.  She denied having any weight loss or night sweats.  She has no nausea, vomiting, diarrhea or constipation.  She is here today for evaluation before starting cycle #6.   MEDICAL HISTORY: Past Medical History:  Diagnosis Date  . AICD (automatic cardioverter/defibrillator) present   . Allergic rhinitis   . Anxiety   . Arthritis   . Asthma   . Atrial flutter (Nancy Hodges)   . Cholelithiases 09/26/2015  . Diverticulosis   . Dysrhythmia   . Encounter for antineoplastic immunotherapy 12/09/2016  . GERD (gastroesophageal reflux disease)   . H/O hiatal hernia     . History of blood transfusion   . History of chemotherapy   . History of kidney stones   . History of radiation therapy   . Internal hemorrhoid   . Lung cancer (Nancy Hodges)    a. Stage IB non-small cell carcinoma, s/p R VATS, wedge resection, RU lobectomy 10/2012.  . Paroxysmal atrial fibrillation ([facility])    a. anticoagulated with Xarelto  . Pneumonia 06/2016   [facility]  . Presence of permanent cardiac pacemaker   . Pulmonary nodule   . Tachycardia-bradycardia syndrome (Nancy Hodges)    a. 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    ALLERGIES:  is allergic to chlorpheniramine-dm; prednisone; septra [sulfamethoxazole-trimethoprim]; penicillins; and vioxx [rofecoxib].  MEDICATIONS:  Current Outpatient Medications  Medication Sig Dispense Refill  . acetaminophen (TYLENOL) 500 MG tablet Take 500 mg by mouth every 6 (six) hours as needed for moderate pain or fever.     [name] albuterol (PROVENTIL HFA;VENTOLIN HFA) 108 (90 BASE) MCG/ACT inhaler Inhale 1-2 puffs into the lungs every 6 (six) hours as needed for wheezing or shortness of breath.    . cephALEXin (KEFLEX) 500 MG capsule Take 1 capsule (500 mg total) by mouth 4 (four) times daily. 28 capsule 0  . Chlorpheniramine-DM (CORICIDIN COUGH/COLD) 4-30 MG TABS Take 1-2 tablets by mouth daily as needed (FOR COLD SYMPTOMS).    [name] diltiazem (CARDIZEM CD) 240 MG 24 hr capsule Take 1 capsule (240 mg total) by mouth daily. 90 capsule 3  . hydroxypropyl methylcellulose / hypromellose (ISOPTO TEARS / GONIOVISC) 2.5 % ophthalmic solution  Place 1-2 drops into the left eye as needed for dry eyes.    [name] ipratropium-albuterol (DUONEB) 0.5-2.5 (3) MG/3ML SOLN Take 3 mLs by nebulization every 6 (six) hours as needed (shortness of breath or wheezing).     [name] lidocaine-prilocaine (EMLA) cream Apply 1 application topically as needed. 30 g 4  . loratadine (CLARITIN) 10 MG tablet Take 10 mg by mouth daily.    . Multiple Vitamin (MULTIVITAMIN) tablet Take 1 tablet by mouth  daily.    . rivaroxaban (XARELTO) 20 MG TABS tablet Take 1 tablet (20 mg total) by mouth daily with supper. 30 tablet 11   No current facility-administered medications for this visit.     SURGICAL HISTORY:  Past Surgical History:  Procedure Laterality Date  . CATARACT EXTRACTION W/PHACO Right 08/14/2016   Procedure: CATARACT EXTRACTION PHACO AND INTRAOCULAR LENS PLACEMENT RIGHT EYE CDE 10.53;  Surgeon: Tonny Branch, MD;  Location: [facility];  Service: Ophthalmology;  Laterality: Right;  right  . CATARACT EXTRACTION W/PHACO Left 09/25/2016   Procedure: CATARACT EXTRACTION PHACO AND INTRAOCULAR LENS PLACEMENT (IOC);  Surgeon: Tonny Branch, MD;  Location: [facility];  Service: Ophthalmology;  Laterality: Left;  left CDE 8.81  . COLONOSCOPY     [facility]: cattered sigmoid diverticula, internal hemorrhoids, sessile polyp in ascending and mid transverse colon (tubular adenoma).   . colonoscopy with polypectomy  2015  . ESOPHAGOGASTRODUODENOSCOPY     [facility]: bile reflux in stomach, mild gastritis, hiatal hernia  . ESOPHAGOGASTRODUODENOSCOPY N/A 01/31/2016   Procedure: ESOPHAGOGASTRODUODENOSCOPY (EGD);  Surgeon: Nancy Binder, MD;  Location: [facility];  Service: Endoscopy;  Laterality: N/A;  1200  . IR FLUORO GUIDE PORT INSERTION RIGHT  06/30/2017  . IR US GUIDE VASC ACCESS RIGHT  06/30/2017  . LOBECTOMY Right 10/27/2012   Procedure: LOBECTOMY;  Surgeon: Nancy Nakayama, MD;  Location: [facility];  Service: Thoracic;  Laterality: Right;   RIGHT UPPER LOBECTOMY & Node Dissection  . LYMPH NODE BIOPSY Right 05/01/2014   Procedure: LYMPH NODE BIOPSY, Right Axillary;  Surgeon: Nancy Nakayama, MD;  Location: [facility];  Service: Thoracic;  Laterality: Right;  . PARTIAL HYSTERECTOMY    . PERMANENT PACEMAKER INSERTION N/A 09/14/2012   Nanostim (SJM) leadless pacemaker (LEADLESS II STUDY PATIENT) implanted by Dr Nancy Hodges  . VIDEO ASSISTED THORACOSCOPY (VATS)/WEDGE RESECTION Right 10/27/2012   Procedure: VIDEO  ASSISTED THORACOSCOPY (VATS),RGHT UPPER LOBE LUNG WEDGE RESECTION;  Surgeon: Nancy Nakayama, MD;  Location: [facility];  Service: Thoracic;  Laterality: Right;    REVIEW OF SYSTEMS:  A comprehensive review of systems was negative except for: Constitutional: positive for fatigue   PHYSICAL EXAMINATION: General appearance: alert, cooperative, fatigued and no distress Head: Normocephalic, without obvious abnormality, atraumatic Neck: no adenopathy, no JVD, supple, symmetrical, trachea midline and thyroid not enlarged, symmetric, no tenderness/mass/nodules Lymph nodes: Cervical, supraclavicular, and axillary nodes normal. Resp: clear to auscultation bilaterally Back: symmetric, no curvature. ROM normal. No CVA tenderness. Cardio: regular rate and rhythm, S1, S2 normal, no murmur, click, rub or gallop GI: soft, non-tender; bowel sounds normal; no masses,  no organomegaly Extremities: extremities normal, atraumatic, no cyanosis or edema  ECOG PERFORMANCE STATUS: 1 - Symptomatic but completely ambulatory  Blood pressure (!) 141/52, pulse 69, temperature 98 F (36.7 C), temperature source Oral, resp. rate (!) 22, height 5\' 2"  (1.575 m), weight 149 lb (67.6 kg), SpO2 99 %.  LABORATORY DATA: Lab Results  Component Value Date   WBC 5.0 08/05/2017   HGB 12.4 08/05/2017  HCT 35.5 08/05/2017   MCV 96.5 08/05/2017   PLT 165 08/05/2017      Chemistry      Component Value Date/Time   NA 140 07/07/2017 1008   K 4.2 07/07/2017 1008   CL 108 06/30/2017 1052   CO2 27 07/07/2017 1008   BUN 9.0 07/07/2017 1008   CREATININE 0.8 07/07/2017 1008   GLU 134 (H) 02/18/2017 1410      Component Value Date/Time   CALCIUM 10.1 07/07/2017 1008   ALKPHOS 118 07/07/2017 1008   AST 50 (H) 07/07/2017 1008   ALT 41 07/07/2017 1008   BILITOT 1.18 07/07/2017 1008       RADIOGRAPHIC STUDIES: No results found. ASSESSMENT AND PLAN:  This is a very pleasant 72 years old white female with metastatic  non-small cell lung cancer, adenocarcinoma with no actionable mutations status post systemic chemotherapy with carboplatin and Alimta and she is currently undergoing treatment with second line immunotherapy with Nivolumab status post 6 cycles. This was followed by immunotherapy with Nivolumab 480 mg IV every 4 weeks status post 5 cycles. The patient continues to tolerate her treatment with immunotherapy fairly well. I recommended for the patient to proceed with cycle #6 today as a schedule. I will see her back for follow-up visit in 4 weeks for evaluation after repeating CT scan of the chest, abdomen and pelvis for restaging of her disease. She was advised to call immediately if she has any concerning symptoms in the interval. The patient voices understanding of current disease status and treatment options and is in agreement with the current care plan. All questions were answered. The patient knows to call the clinic with any problems, questions or concerns. We can certainly see the patient much sooner if necessary.  Disclaimer: This note was dictated with voice recognition software. Similar sounding words can inadvertently be transcribed and may not be corrected upon review.

## 2017-08-05 NOTE — Patient Instructions (Signed)
[facility] Discharge Instructions for Patients Receiving Chemotherapy  Today you received the following chemotherapy agents Opdivo  To help prevent nausea and vomiting after your treatment, we encourage you to take your nausea medication as directed   If you develop nausea and vomiting that is not controlled by your nausea medication, call the clinic.   BELOW ARE SYMPTOMS THAT SHOULD BE REPORTED IMMEDIATELY:  *FEVER GREATER THAN 100.5 F  *CHILLS WITH OR WITHOUT FEVER  NAUSEA AND VOMITING THAT IS NOT CONTROLLED WITH YOUR NAUSEA MEDICATION  *UNUSUAL SHORTNESS OF BREATH  *UNUSUAL BRUISING OR BLEEDING  TENDERNESS IN MOUTH AND THROAT WITH OR WITHOUT PRESENCE OF ULCERS  *URINARY PROBLEMS  *BOWEL PROBLEMS  UNUSUAL RASH Items with * indicate a potential emergency and should be followed up as soon as possible.  Feel free to call the clinic should you have any questions or concerns. The clinic phone number is [phone].  Please show the CHEMO ALERT CARD at check-in to the Emergency Department and triage nurse.   

## 2017-08-05 NOTE — Telephone Encounter (Signed)
Gave patient avs report and appointments for January and February. Central radiology will call re scan.

## 2017-08-05 NOTE — Patient Instructions (Signed)
Implanted Port Home Guide An implanted port is a type of central line that is placed under the skin. Central lines are used to provide IV access when treatment or nutrition needs to be given through a person's veins. Implanted ports are used for long-term IV access. An implanted port may be placed because:  You need IV medicine that would be irritating to the small veins in your hands or arms.  You need long-term IV medicines, such as antibiotics.  You need IV nutrition for a long period.  You need frequent blood draws for lab tests.  You need dialysis.  Implanted ports are usually placed in the chest area, but they can also be placed in the upper arm, the abdomen, or the leg. An implanted port has two main parts:  Reservoir. The reservoir is round and will appear as a small, raised area under your skin. The reservoir is the part where a needle is inserted to give medicines or draw blood.  Catheter. The catheter is a thin, flexible tube that extends from the reservoir. The catheter is placed into a large vein. Medicine that is inserted into the reservoir goes into the catheter and then into the vein.  How will I care for my incision site? Do not get the incision site wet. Bathe or shower as directed by your health care provider. How is my port accessed? Special steps must be taken to access the port:  Before the port is accessed, a numbing cream can be placed on the skin. This helps numb the skin over the port site.  Your health care provider uses a sterile technique to access the port. ? Your health care provider must put on a mask and sterile gloves. ? The skin over your port is cleaned carefully with an antiseptic and allowed to dry. ? The port is gently pinched between sterile gloves, and a needle is inserted into the port.  Only "non-coring" port needles should be used to access the port. Once the port is accessed, a blood return should be checked. This helps ensure that the port  is in the vein and is not clogged.  If your port needs to remain accessed for a constant infusion, a clear (transparent) bandage will be placed over the needle site. The bandage and needle will need to be changed every week, or as directed by your health care provider.  Keep the bandage covering the needle clean and dry. Do not get it wet. Follow your health care provider's instructions on how to take a shower or bath while the port is accessed.  If your port does not need to stay accessed, no bandage is needed over the port.  What is flushing? Flushing helps keep the port from getting clogged. Follow your health care provider's instructions on how and when to flush the port. Ports are usually flushed with saline solution or a medicine called heparin. The need for flushing will depend on how the port is used.  If the port is used for intermittent medicines or blood draws, the port will need to be flushed: ? After medicines have been given. ? After blood has been drawn. ? As part of routine maintenance.  If a constant infusion is running, the port may not need to be flushed.  How long will my port stay implanted? The port can stay in for as long as your health care provider thinks it is needed. When it is time for the port to come out, surgery will be   done to remove it. The procedure is similar to the one performed when the port was put in. When should I seek immediate medical care? When you have an implanted port, you should seek immediate medical care if:  You notice a bad smell coming from the incision site.  You have swelling, redness, or drainage at the incision site.  You have more swelling or pain at the port site or the surrounding area.  You have a fever that is not controlled with medicine.  This information is not intended to replace advice given to you by your health care provider. Make sure you discuss any questions you have with your health care provider. Document  Released: 07/21/2005 Document Revised: 12/27/2015 Document Reviewed: 03/28/2013 Elsevier Interactive Patient Education  2017 [employer]  

## 2017-08-10 MED ORDER — PROCHLORPERAZINE MALEATE 10 MG PO TABS
ORAL_TABLET | ORAL | Status: AC
  Filled 2017-08-10: qty 1

## 2017-08-31 ENCOUNTER — Encounter (HOSPITAL_COMMUNITY): Payer: Self-pay

## 2017-08-31 ENCOUNTER — Ambulatory Visit (HOSPITAL_COMMUNITY)
Admission: RE | Admit: 2017-08-31 | Discharge: 2017-08-31 | Disposition: A | Discharge status: Home / Self Care | Payer: Medicare Other | Source: Ambulatory Visit | Attending: Internal Medicine | Admitting: Internal Medicine

## 2017-08-31 DIAGNOSIS — I8500 Esophageal varices without bleeding: Secondary | ICD-10-CM | POA: Insufficient documentation

## 2017-08-31 DIAGNOSIS — C3490 Malignant neoplasm of unspecified part of unspecified bronchus or lung: Secondary | ICD-10-CM | POA: Insufficient documentation

## 2017-08-31 DIAGNOSIS — I2510 Atherosclerotic heart disease of native coronary artery without angina pectoris: Secondary | ICD-10-CM | POA: Insufficient documentation

## 2017-08-31 DIAGNOSIS — K8020 Calculus of gallbladder without cholecystitis without obstruction: Secondary | ICD-10-CM | POA: Insufficient documentation

## 2017-08-31 DIAGNOSIS — I864 Gastric varices: Secondary | ICD-10-CM | POA: Diagnosis not present

## 2017-08-31 DIAGNOSIS — K7460 Unspecified cirrhosis of liver: Secondary | ICD-10-CM | POA: Insufficient documentation

## 2017-08-31 DIAGNOSIS — I700 Atherosclerosis of aorta: Secondary | ICD-10-CM | POA: Insufficient documentation

## 2017-08-31 MED ORDER — HEPARIN SOD (PORK) LOCK FLUSH 100 UNIT/ML IV SOLN
INTRAVENOUS | Status: AC
  Filled 2017-08-31: qty 5

## 2017-08-31 MED ORDER — IOPAMIDOL (ISOVUE-300) INJECTION 61%
INTRAVENOUS | Status: AC
  Filled 2017-08-31: qty 100

## 2017-08-31 MED ORDER — IOPAMIDOL (ISOVUE-300) INJECTION 61%
100.0000 mL | Freq: Once | INTRAVENOUS | Status: AC | PRN
  Administered 2017-08-31: 100 mL via INTRAVENOUS

## 2017-09-01 ENCOUNTER — Inpatient Hospital Stay: Payer: Medicare Other

## 2017-09-01 ENCOUNTER — Encounter: Payer: Self-pay | Admitting: Internal Medicine

## 2017-09-01 ENCOUNTER — Encounter: Payer: Self-pay | Admitting: *Deleted

## 2017-09-01 ENCOUNTER — Inpatient Hospital Stay: Payer: Medicare Other | Attending: Internal Medicine | Admitting: Internal Medicine

## 2017-09-01 VITALS — BP 145/46 | HR 72 | Temp 98.1°F | Resp 20 | Ht 62.0 in | Wt 150.0 lb

## 2017-09-01 DIAGNOSIS — I10 Essential (primary) hypertension: Secondary | ICD-10-CM | POA: Insufficient documentation

## 2017-09-01 DIAGNOSIS — C3491 Malignant neoplasm of unspecified part of right bronchus or lung: Secondary | ICD-10-CM

## 2017-09-01 DIAGNOSIS — Z7901 Long term (current) use of anticoagulants: Secondary | ICD-10-CM | POA: Diagnosis not present

## 2017-09-01 DIAGNOSIS — C3411 Malignant neoplasm of upper lobe, right bronchus or lung: Secondary | ICD-10-CM | POA: Diagnosis not present

## 2017-09-01 DIAGNOSIS — Z950 Presence of cardiac pacemaker: Secondary | ICD-10-CM | POA: Diagnosis not present

## 2017-09-01 DIAGNOSIS — Z5112 Encounter for antineoplastic immunotherapy: Secondary | ICD-10-CM | POA: Insufficient documentation

## 2017-09-01 DIAGNOSIS — I480 Paroxysmal atrial fibrillation: Secondary | ICD-10-CM

## 2017-09-01 DIAGNOSIS — C773 Secondary and unspecified malignant neoplasm of axilla and upper limb lymph nodes: Secondary | ICD-10-CM | POA: Insufficient documentation

## 2017-09-01 DIAGNOSIS — Z95828 Presence of other vascular implants and grafts: Secondary | ICD-10-CM

## 2017-09-01 DIAGNOSIS — Z7189 Other specified counseling: Secondary | ICD-10-CM

## 2017-09-01 LAB — COMPREHENSIVE METABOLIC PANEL
ALT: 24 U/L (ref 0–55)
AST: 30 U/L (ref 5–34)
Albumin: 3.8 g/dL (ref 3.5–5.0)
Alkaline Phosphatase: 95 U/L (ref 40–150)
Anion gap: 7 (ref 3–11)
BILIRUBIN TOTAL: 1.3 mg/dL — AB (ref 0.2–1.2)
BUN: 8 mg/dL (ref 7–26)
CHLORIDE: 106 mmol/L (ref 98–109)
CO2: 27 mmol/L (ref 22–29)
Calcium: 9.6 mg/dL (ref 8.4–10.4)
Creatinine, Ser: 0.79 mg/dL (ref 0.60–1.10)
GFR calc non Af Amer: >60 mL/min (ref >60)
Glucose, Bld: 160 mg/dL — ABNORMAL HIGH (ref 70–140)
POTASSIUM: 4.3 mmol/L (ref 3.3–4.7)
Sodium: 140 mmol/L (ref 136–145)
TOTAL PROTEIN: 6.8 g/dL (ref 6.4–8.3)

## 2017-09-01 LAB — CBC WITH DIFFERENTIAL/PLATELET
BASOS ABS: 0 10*3/uL (ref 0.0–0.1)
Basophils Relative: 1 %
EOS ABS: 0.1 10*3/uL (ref 0.0–0.5)
Eosinophils Relative: 3 %
HCT: 36.8 % (ref 34.8–46.6)
HEMOGLOBIN: 12.6 g/dL (ref 11.6–15.9)
LYMPHS ABS: 0.8 10*3/uL — AB (ref 0.9–3.3)
LYMPHS PCT: 22 %
MCH: 33.1 pg (ref 25.1–34.0)
MCHC: 34.3 g/dL (ref 31.5–36.0)
MCV: 96.5 fL (ref 79.5–101.0)
Monocytes Absolute: 0.2 10*3/uL (ref 0.1–0.9)
Monocytes Relative: 6 %
NEUTROS PCT: 68 %
Neutro Abs: 2.4 10*3/uL (ref 1.5–6.5)
PLATELETS: 157 10*3/uL (ref 145–400)
RBC: 3.82 MIL/uL (ref 3.70–5.45)
RDW: 13 % (ref 11.2–16.1)
WBC: 3.5 10*3/uL — AB (ref 3.9–10.3)

## 2017-09-01 MED ORDER — HEPARIN SOD (PORK) LOCK FLUSH 100 UNIT/ML IV SOLN
500.0000 [IU] | Freq: Once | INTRAVENOUS | Status: AC | PRN
  Administered 2017-09-01: 500 [IU]
  Filled 2017-09-01: qty 5

## 2017-09-01 MED ORDER — SODIUM CHLORIDE 0.9% FLUSH
10.0000 mL | INTRAVENOUS | Status: DC | PRN
  Administered 2017-09-01: 10 mL via INTRAVENOUS
  Filled 2017-09-01: qty 10

## 2017-09-01 MED ORDER — SODIUM CHLORIDE 0.9 % IV SOLN
Freq: Once | INTRAVENOUS | Status: AC
  Administered 2017-09-01: 10:00:00 via INTRAVENOUS

## 2017-09-01 MED ORDER — SODIUM CHLORIDE 0.9% FLUSH
10.0000 mL | INTRAVENOUS | Status: DC | PRN
  Administered 2017-09-01: 10 mL
  Filled 2017-09-01: qty 10

## 2017-09-01 MED ORDER — SODIUM CHLORIDE 0.9 % IV SOLN
480.0000 mg | Freq: Once | INTRAVENOUS | Status: AC
  Administered 2017-09-01: 480 mg via INTRAVENOUS
  Filled 2017-09-01: qty 48

## 2017-09-01 NOTE — Progress Notes (Signed)
[place] [phone]   [phone]) [phone]  OFFICE PROGRESS NOTE  Nancy Hodges [address] [place] [address]  DIAGNOSIS: Metastatic non-small cell lung cancer, adenocarcinoma. This was initially diagnosed as stage IB (T2a, N0, M0) in March of 2014, status post right upper lobectomy with lymph node dissection but the patient has evidence for disease recurrence in the right axilla status post resection.  PRIOR THERAPY:  1) Status post right axillary lymph node biopsy on 05/01/2014 under the care of Dr. Roxan Hockey. 2) Systemic chemotherapy with carboplatin for AUC of 5 and Alimta 500 mg/M2 every 3 weeks. First cycle 05/31/2014. Status post 6 cycles. Last dose was given 09/22/2014. 3) palliative radiotherapy to the subcarinal lymph node under the care of Dr. Sondra Come. 4) Nivolumab 240 mg IV every 2 weeks. First dose 12/23/2016. Status post 6 cycles.  CURRENT THERAPY:  Nivolumab 480 mg IV every 4 weeks. First dose 03/17/2017. Status post 6 cycles.  INTERVAL HISTORY: Nancy Hodges 72 y.o. female returns to the clinic today for follow-up visit.  The patient is feeling fine today with no specific complaints.  She denied having any chest pain, shortness breath, cough or hemoptysis.  She denied having any fever or chills.  She has no nausea, vomiting, diarrhea or constipation.  She denied having any significant weight loss or night sweats.  She continues to tolerate her treatment with Nivolumab every 4 weeks fairly well.  She is here today for evaluation after repeating CT scan of the chest, abdomen and pelvis for restaging of her disease.   MEDICAL HISTORY: Past Medical History:  Diagnosis Date  . AICD (automatic cardioverter/defibrillator) present   . Allergic rhinitis   . Anxiety   . Arthritis   . Asthma   . Atrial flutter ([place])   . Cholelithiases 09/26/2015  . Diverticulosis   . Dysrhythmia   . Encounter for antineoplastic  immunotherapy 12/09/2016  . GERD (gastroesophageal reflux disease)   . H/O hiatal hernia   . History of blood transfusion   . History of chemotherapy   . History of kidney stones   . History of radiation therapy   . Internal hemorrhoid   . Lung cancer ([place])    a. Stage IB non-small cell carcinoma, s/p R VATS, wedge resection, RU lobectomy 10/2012.  . Paroxysmal atrial fibrillation ([facility])    a. anticoagulated with Xarelto  . Pneumonia 06/2016   [facility]  . Presence of permanent cardiac pacemaker   . Pulmonary nodule   . Tachycardia-bradycardia syndrome ([place])    a. 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    ALLERGIES:  is allergic to chlorpheniramine-dm; prednisone; septra [sulfamethoxazole-trimethoprim]; penicillins; and vioxx [rofecoxib].  MEDICATIONS:  Current Outpatient Medications  Medication Sig Dispense Refill  . acetaminophen (TYLENOL) 500 MG tablet Take 500 mg by mouth every 6 (six) hours as needed for moderate pain or fever.     [name] albuterol (PROVENTIL HFA;VENTOLIN HFA) 108 (90 BASE) MCG/ACT inhaler Inhale 1-2 puffs into the lungs every 6 (six) hours as needed for wheezing or shortness of breath.    . diltiazem (CARDIZEM CD) 240 MG 24 hr capsule Take 1 capsule (240 mg total) by mouth daily. 90 capsule 3  . hydroxypropyl methylcellulose / hypromellose (ISOPTO TEARS / GONIOVISC) 2.5 % ophthalmic solution Place 1-2 drops into the left eye as needed for dry eyes.    [name] ipratropium-albuterol (DUONEB) 0.5-2.5 (3) MG/3ML SOLN Take 3 mLs by nebulization every 6 (  six) hours as needed (shortness of breath or wheezing).     [name] lidocaine-prilocaine (EMLA) cream Apply 1 application topically as needed. 30 g 4  . loratadine (CLARITIN) 10 MG tablet Take 10 mg by mouth daily.    . Multiple Vitamin (MULTIVITAMIN) tablet Take 1 tablet by mouth daily.    . rivaroxaban (XARELTO) 20 MG TABS tablet Take 1 tablet (20 mg total) by mouth daily with supper. 30 tablet 11  . cephALEXin (KEFLEX) 500  MG capsule Take 1 capsule (500 mg total) by mouth 4 (four) times daily. 28 capsule 0   No current facility-administered medications for this visit.     SURGICAL HISTORY:  Past Surgical History:  Procedure Laterality Date  . CATARACT EXTRACTION W/PHACO Right 08/14/2016   Procedure: CATARACT EXTRACTION PHACO AND INTRAOCULAR LENS PLACEMENT RIGHT EYE CDE 10.53;  Surgeon: Tonny Branch, MD;  Location: [facility];  Service: Ophthalmology;  Laterality: Right;  right  . CATARACT EXTRACTION W/PHACO Left 09/25/2016   Procedure: CATARACT EXTRACTION PHACO AND INTRAOCULAR LENS PLACEMENT (IOC);  Surgeon: Tonny Branch, MD;  Location: [facility];  Service: Ophthalmology;  Laterality: Left;  left CDE 8.81  . COLONOSCOPY     [facility]: cattered sigmoid diverticula, internal hemorrhoids, sessile polyp in ascending and mid transverse colon (tubular adenoma).   . colonoscopy with polypectomy  2015  . ESOPHAGOGASTRODUODENOSCOPY     [facility]: bile reflux in stomach, mild gastritis, hiatal hernia  . ESOPHAGOGASTRODUODENOSCOPY N/A 01/31/2016   Procedure: ESOPHAGOGASTRODUODENOSCOPY (EGD);  Surgeon: [name], MD;  Location: [facility];  Service: Endoscopy;  Laterality: N/A;  1200  . IR FLUORO GUIDE PORT INSERTION RIGHT  06/30/2017  . IR US GUIDE VASC ACCESS RIGHT  06/30/2017  . LOBECTOMY Right 10/27/2012   Procedure: LOBECTOMY;  Surgeon: [name], MD;  Location: [facility];  Service: Thoracic;  Laterality: Right;   RIGHT UPPER LOBECTOMY & Node Dissection  . LYMPH NODE BIOPSY Right 05/01/2014   Procedure: LYMPH NODE BIOPSY, Right Axillary;  Surgeon: [name], MD;  Location: [facility];  Service: Thoracic;  Laterality: Right;  . PARTIAL HYSTERECTOMY    . PERMANENT PACEMAKER INSERTION N/A 09/14/2012   Nanostim (SJM) leadless pacemaker (LEADLESS II STUDY PATIENT) implanted by Dr [name]  . VIDEO ASSISTED THORACOSCOPY (VATS)/WEDGE RESECTION Right 10/27/2012   Procedure: VIDEO ASSISTED THORACOSCOPY (VATS),RGHT UPPER  LOBE LUNG WEDGE RESECTION;  Surgeon: [name], MD;  Location: [facility];  Service: Thoracic;  Laterality: Right;    REVIEW OF SYSTEMS:  Constitutional: negative Eyes: negative Ears, nose, mouth, throat, and face: negative Respiratory: negative Cardiovascular: negative Gastrointestinal: negative Genitourinary:negative Integument/breast: negative Hematologic/lymphatic: negative Musculoskeletal:negative Neurological: negative Behavioral/Psych: negative Endocrine: negative Allergic/Immunologic: negative   PHYSICAL EXAMINATION: General appearance: alert, cooperative and no distress Head: Normocephalic, without obvious abnormality, atraumatic Neck: no adenopathy, no JVD, supple, symmetrical, trachea midline and thyroid not enlarged, symmetric, no tenderness/mass/nodules Lymph nodes: Cervical, supraclavicular, and axillary nodes normal. Resp: clear to auscultation bilaterally Back: symmetric, no curvature. ROM normal. No CVA tenderness. Cardio: regular rate and rhythm, S1, S2 normal, no murmur, click, rub or gallop GI: soft, non-tender; bowel sounds normal; no masses,  no organomegaly Extremities: extremities normal, atraumatic, no cyanosis or edema Neurologic: Alert and oriented X 3, normal strength and tone. Normal symmetric reflexes. Normal coordination and gait  ECOG PERFORMANCE STATUS: 1 - Symptomatic but completely ambulatory  Blood pressure (!) 145/46, pulse 72, temperature 98.1 F (36.7 C), resp. rate 20, height 5\' 2"  (1.575 m), weight 150 lb (68  kg), SpO2 98 %.  LABORATORY DATA: Lab Results  Component Value Date   WBC 3.5 (L) 09/01/2017   HGB 12.6 09/01/2017   HCT 36.8 09/01/2017   MCV 96.5 09/01/2017   PLT 157 09/01/2017      Chemistry      Component Value Date/Time   NA 140 09/01/2017 0837   NA 139 08/05/2017 0902   K 4.3 09/01/2017 0837   K 4.1 08/05/2017 0902   CL 106 09/01/2017 0837   CO2 27 09/01/2017 0837   CO2 28 08/05/2017 0902   BUN 8  09/01/2017 0837   BUN 10.1 08/05/2017 0902   CREATININE 0.79 09/01/2017 0837   CREATININE 0.8 08/05/2017 0902   GLU 134 (H) 02/18/2017 1410      Component Value Date/Time   CALCIUM 9.6 09/01/2017 0837   CALCIUM 9.5 08/05/2017 0902   ALKPHOS 95 09/01/2017 0837   ALKPHOS 111 08/05/2017 0902   AST 30 09/01/2017 0837   AST 33 08/05/2017 0902   ALT 24 09/01/2017 0837   ALT 34 08/05/2017 0902   BILITOT 1.3 (H) 09/01/2017 0837   BILITOT 1.39 (H) 08/05/2017 0902       RADIOGRAPHIC STUDIES: Ct Chest W Contrast  Result Date: 08/31/2017 CLINICAL DATA:  Non-small cell lung cancer, ongoing chemotherapy, radiation therapy complete. Intermittent cough. Shortness of breath on exertion. EXAM: CT CHEST, ABDOMEN, AND PELVIS WITH CONTRAST TECHNIQUE: Multidetector CT imaging of the chest, abdomen and pelvis was performed following the standard protocol during bolus administration of intravenous contrast. CONTRAST:  114mL ISOVUE-300 IOPAMIDOL (ISOVUE-300) INJECTION 61% COMPARISON:  07/03/2017. FINDINGS: CT CHEST FINDINGS Cardiovascular: Right IJ Port-A-Cath terminates in the low SVC. Atherosclerotic calcification of the arterial vasculature, including coronary arteries. Heart size normal. No pericardial effusion. Mediastinum/Nodes: No pathologically enlarged mediastinal, hilar or axillary lymph nodes. Surgical clips in the right axilla. Esophagus is grossly unremarkable. Small hiatal hernia with gastroesophageal varices. Lungs/Pleura: Biapical pleuroparenchymal scarring. Mild centrilobular emphysema. Right upper lobectomy with associated postsurgical changes in the right hemithorax and mild radiation scarring in the medial right lower lobe. Amorphous ground-glass in the apical left upper lobe is unchanged. Few scattered tiny pulmonary nodules are unchanged. No pleural fluid. Narrowing of the right middle lobe bronchus, as before. Airway is otherwise unremarkable. Musculoskeletal: No worrisome lytic or sclerotic  lesions. CT ABDOMEN PELVIS FINDINGS Hepatobiliary: Liver margin is irregular. Stones are seen in the gallbladder. No biliary ductal dilatation. Pancreas: Negative. Spleen: Borderline enlarged. 12 mm low-attenuation lesion in the posterior aspect is unchanged and too small to characterize. Adrenals/Urinary Tract: Adrenal glands are unremarkable. Renal cortical scarring. Ureters are decompressed. Bladder is grossly unremarkable. Stomach/Bowel: Tiny hiatal hernia. Stomach is decompressed. Small bowel, appendix and colon are unremarkable. Vascular/Lymphatic: Atherosclerotic calcification of the arterial vasculature without abdominal aortic aneurysm. Gastroesophageal varices. Porta hepatis lymph nodes measure up to 8 mm in short axis. Abdominal retroperitoneal lymph nodes are not enlarged by CT size criteria. Reproductive: Hysterectomy.  No adnexal mass. Other: No free fluid. Mesenteries and peritoneum are otherwise unremarkable. Musculoskeletal: No worrisome lytic or sclerotic lesions. Degenerative changes in the spine. IMPRESSION: 1. Right upper lobectomy without evidence of recurrent or metastatic disease. 2. Aortic atherosclerosis (ICD10-170.0). Coronary artery calcification. 3. Cirrhosis gastroesophageal varices. 4. Cholelithiasis. Electronically Signed   By: Lorin Picket M.D.   On: 08/31/2017 11:27   Ct Abdomen Pelvis W Contrast  Result Date: 08/31/2017 CLINICAL DATA:  Non-small cell lung cancer, ongoing chemotherapy, radiation therapy complete. Intermittent cough. Shortness of breath on exertion. EXAM: CT  CHEST, ABDOMEN, AND PELVIS WITH CONTRAST TECHNIQUE: Multidetector CT imaging of the chest, abdomen and pelvis was performed following the standard protocol during bolus administration of intravenous contrast. CONTRAST:  168mL ISOVUE-300 IOPAMIDOL (ISOVUE-300) INJECTION 61% COMPARISON:  07/03/2017. FINDINGS: CT CHEST FINDINGS Cardiovascular: Right IJ Port-A-Cath terminates in the low SVC. Atherosclerotic  calcification of the arterial vasculature, including coronary arteries. Heart size normal. No pericardial effusion. Mediastinum/Nodes: No pathologically enlarged mediastinal, hilar or axillary lymph nodes. Surgical clips in the right axilla. Esophagus is grossly unremarkable. Small hiatal hernia with gastroesophageal varices. Lungs/Pleura: Biapical pleuroparenchymal scarring. Mild centrilobular emphysema. Right upper lobectomy with associated postsurgical changes in the right hemithorax and mild radiation scarring in the medial right lower lobe. Amorphous ground-glass in the apical left upper lobe is unchanged. Few scattered tiny pulmonary nodules are unchanged. No pleural fluid. Narrowing of the right middle lobe bronchus, as before. Airway is otherwise unremarkable. Musculoskeletal: No worrisome lytic or sclerotic lesions. CT ABDOMEN PELVIS FINDINGS Hepatobiliary: Liver margin is irregular. Stones are seen in the gallbladder. No biliary ductal dilatation. Pancreas: Negative. Spleen: Borderline enlarged. 12 mm low-attenuation lesion in the posterior aspect is unchanged and too small to characterize. Adrenals/Urinary Tract: Adrenal glands are unremarkable. Renal cortical scarring. Ureters are decompressed. Bladder is grossly unremarkable. Stomach/Bowel: Tiny hiatal hernia. Stomach is decompressed. Small bowel, appendix and colon are unremarkable. Vascular/Lymphatic: Atherosclerotic calcification of the arterial vasculature without abdominal aortic aneurysm. Gastroesophageal varices. Porta hepatis lymph nodes measure up to 8 mm in short axis. Abdominal retroperitoneal lymph nodes are not enlarged by CT size criteria. Reproductive: Hysterectomy.  No adnexal mass. Other: No free fluid. Mesenteries and peritoneum are otherwise unremarkable. Musculoskeletal: No worrisome lytic or sclerotic lesions. Degenerative changes in the spine. IMPRESSION: 1. Right upper lobectomy without evidence of recurrent or metastatic disease.  2. Aortic atherosclerosis (ICD10-170.0). Coronary artery calcification. 3. Cirrhosis gastroesophageal varices. 4. Cholelithiasis. Electronically Signed   By: Lorin Picket M.D.   On: 08/31/2017 11:27   ASSESSMENT AND PLAN:  This is a very pleasant 72 years old white female with metastatic non-small cell lung cancer, adenocarcinoma with no actionable mutations status post systemic chemotherapy with carboplatin and Alimta and she is currently undergoing treatment with second line immunotherapy with Nivolumab status post 6 cycles. This was followed by immunotherapy with Nivolumab 480 mg IV every 4 weeks status post 6 cycles. She continues to tolerate this treatment fairly well with no concerning complaints. She had repeat CT scan of the chest, abdomen and pelvis performed recently.  I personally and independently reviewed the scans and discussed the results with the patient today.  Her scan showed no concerning findings for disease progression. I recommended for her to continue her current treatment with Nivolumab and she will proceed with cycle #7 today. For hypertension, she was advised to monitor her blood pressure closely at home and to discuss with her primary care physician for any adjustment of her medication. The patient will come back for follow-up visit in 4 weeks for evaluation before starting cycle #8. She was advised to call immediately if she has any concerning symptoms in the interval. The patient voices understanding of current disease status and treatment options and is in agreement with the current care plan. All questions were answered. The patient knows to call the clinic with any problems, questions or concerns. We can certainly see the patient much sooner if necessary.  Disclaimer: This note was dictated with voice recognition software. Similar sounding words can inadvertently be transcribed and may not be  corrected upon review.

## 2017-09-01 NOTE — Patient Instructions (Signed)
Implanted Port Home Guide An implanted port is a type of central line that is placed under the skin. Central lines are used to provide IV access when treatment or nutrition needs to be given through a person's veins. Implanted ports are used for long-term IV access. An implanted port may be placed because:  You need IV medicine that would be irritating to the small veins in your hands or arms.  You need long-term IV medicines, such as antibiotics.  You need IV nutrition for a long period.  You need frequent blood draws for lab tests.  You need dialysis.  Implanted ports are usually placed in the chest area, but they can also be placed in the upper arm, the abdomen, or the leg. An implanted port has two main parts:  Reservoir. The reservoir is round and will appear as a small, raised area under your skin. The reservoir is the part where a needle is inserted to give medicines or draw blood.  Catheter. The catheter is a thin, flexible tube that extends from the reservoir. The catheter is placed into a large vein. Medicine that is inserted into the reservoir goes into the catheter and then into the vein.  How will I care for my incision site? Do not get the incision site wet. Bathe or shower as directed by your health care provider. How is my port accessed? Special steps must be taken to access the port:  Before the port is accessed, a numbing cream can be placed on the skin. This helps numb the skin over the port site.  Your health care provider uses a sterile technique to access the port. ? Your health care provider must put on a mask and sterile gloves. ? The skin over your port is cleaned carefully with an antiseptic and allowed to dry. ? The port is gently pinched between sterile gloves, and a needle is inserted into the port.  Only "non-coring" port needles should be used to access the port. Once the port is accessed, a blood return should be checked. This helps ensure that the port  is in the vein and is not clogged.  If your port needs to remain accessed for a constant infusion, a clear (transparent) bandage will be placed over the needle site. The bandage and needle will need to be changed every week, or as directed by your health care provider.  Keep the bandage covering the needle clean and dry. Do not get it wet. Follow your health care provider's instructions on how to take a shower or bath while the port is accessed.  If your port does not need to stay accessed, no bandage is needed over the port.  What is flushing? Flushing helps keep the port from getting clogged. Follow your health care provider's instructions on how and when to flush the port. Ports are usually flushed with saline solution or a medicine called heparin. The need for flushing will depend on how the port is used.  If the port is used for intermittent medicines or blood draws, the port will need to be flushed: ? After medicines have been given. ? After blood has been drawn. ? As part of routine maintenance.  If a constant infusion is running, the port may not need to be flushed.  How long will my port stay implanted? The port can stay in for as long as your health care provider thinks it is needed. When it is time for the port to come out, surgery will be   done to remove it. The procedure is similar to the one performed when the port was put in. When should I seek immediate medical care? When you have an implanted port, you should seek immediate medical care if:  You notice a bad smell coming from the incision site.  You have swelling, redness, or drainage at the incision site.  You have more swelling or pain at the port site or the surrounding area.  You have a fever that is not controlled with medicine.  This information is not intended to replace advice given to you by your health care provider. Make sure you discuss any questions you have with your health care provider. Document  Released: 07/21/2005 Document Revised: 12/27/2015 Document Reviewed: 03/28/2013 Elsevier Interactive Patient Education  2017 [employer]  

## 2017-09-01 NOTE — Progress Notes (Signed)
Port access site is pink, with small scab noted over site. Pt stated they used her port to perform CT yesterday

## 2017-09-01 NOTE — Patient Instructions (Signed)
[facility] Discharge Instructions for Patients Receiving Chemotherapy  Today you received the following chemotherapy agents Opdivo  To help prevent nausea and vomiting after your treatment, we encourage you to take your nausea medication as directed   If you develop nausea and vomiting that is not controlled by your nausea medication, call the clinic.   BELOW ARE SYMPTOMS THAT SHOULD BE REPORTED IMMEDIATELY:  *FEVER GREATER THAN 100.5 F  *CHILLS WITH OR WITHOUT FEVER  NAUSEA AND VOMITING THAT IS NOT CONTROLLED WITH YOUR NAUSEA MEDICATION  *UNUSUAL SHORTNESS OF BREATH  *UNUSUAL BRUISING OR BLEEDING  TENDERNESS IN MOUTH AND THROAT WITH OR WITHOUT PRESENCE OF ULCERS  *URINARY PROBLEMS  *BOWEL PROBLEMS  UNUSUAL RASH Items with * indicate a potential emergency and should be followed up as soon as possible.  Feel free to call the clinic should you have any questions or concerns. The clinic phone number is [phone].  Please show the CHEMO ALERT CARD at check-in to the Emergency Department and triage nurse.   

## 2017-09-01 NOTE — Progress Notes (Signed)
Oncology Nurse Navigator Documentation  Oncology Nurse Navigator Flowsheets 09/01/2017  Navigator Location [facility]  Navigator Encounter Type Clinic/MDC/I spoke with patient today at the cancer center.  She is doing well with treatment and has no complaints at this time. I did ask how her heat was at home. She states she is using heating blankets and karosen heater.  I asked if she has CO2 detector she states no.  I said she needs one. I contacted fire dept in [place] to see if they could help Ms. Sow with a CO2 detector.  I was unable to reach them but did leave a vm message with my name and phone number to call.   Patient Visit Type MedOnc  Treatment Phase Treatment  Interventions Other  Acuity Level 2  Acuity Level 2 Other  Time Spent with Patient 30

## 2017-09-07 ENCOUNTER — Telehealth: Payer: Self-pay | Admitting: *Deleted

## 2017-09-07 ENCOUNTER — Encounter: Payer: Self-pay | Admitting: *Deleted

## 2017-09-07 NOTE — Progress Notes (Signed)
Patient called me back and update me that she will go to fire dept to get CO2 detector.

## 2017-09-07 NOTE — Telephone Encounter (Signed)
Oncology Nurse Navigator Documentation  Oncology Nurse Navigator Flowsheets 09/07/2017  Navigator Location [facility]  Navigator Encounter Type Telephone/I called patient today to follow up with her to see if she has gotten a CO2 detector. I was unable to reach her and left vm message for her to call me. I called fire dept in her area to see if they could help. I was unable to reach them and left vm message for them to call me with my name and phone number.   Telephone Outgoing Call  Treatment Phase Treatment  Interventions Other  Acuity Level 2  Time Spent with Patient 30

## 2017-09-08 ENCOUNTER — Other Ambulatory Visit: Payer: Medicare Other

## 2017-09-11 ENCOUNTER — Telehealth: Payer: Self-pay | Admitting: *Deleted

## 2017-09-11 NOTE — Telephone Encounter (Addendum)
Oncology Nurse Navigator Documentation  Oncology Nurse Navigator Flowsheets 09/11/2017  Navigator Location [facility]  Navigator Encounter Type Telephone/Patient called me with an update she has a sinus infection and was on antibiotic. She also had fever yesterday but today is without fever. I will updated Dr. Julien Nordmann. No new orders received from Dr. Julien Nordmann.   Telephone Outgoing Call;Incoming Call  Treatment Phase Treatment  Barriers/Navigation Needs Education  Education Other  Interventions Education  Education Method Verbal  Acuity Level 1  Time Spent with Patient 30

## 2017-09-13 ENCOUNTER — Encounter (HOSPITAL_COMMUNITY): Payer: Self-pay | Admitting: Emergency Medicine

## 2017-09-13 ENCOUNTER — Emergency Department (HOSPITAL_COMMUNITY)
Admission: EM | Admit: 2017-09-13 | Discharge: 2017-09-13 | Disposition: A | Discharge status: Home / Self Care | Payer: Medicare Other | Attending: Emergency Medicine | Admitting: Emergency Medicine

## 2017-09-13 ENCOUNTER — Other Ambulatory Visit: Payer: Self-pay

## 2017-09-13 ENCOUNTER — Emergency Department (HOSPITAL_COMMUNITY): Payer: Medicare Other

## 2017-09-13 DIAGNOSIS — Z79899 Other long term (current) drug therapy: Secondary | ICD-10-CM | POA: Insufficient documentation

## 2017-09-13 DIAGNOSIS — J45909 Unspecified asthma, uncomplicated: Secondary | ICD-10-CM | POA: Insufficient documentation

## 2017-09-13 DIAGNOSIS — K7460 Unspecified cirrhosis of liver: Secondary | ICD-10-CM | POA: Insufficient documentation

## 2017-09-13 DIAGNOSIS — R0981 Nasal congestion: Secondary | ICD-10-CM | POA: Diagnosis not present

## 2017-09-13 DIAGNOSIS — R05 Cough: Secondary | ICD-10-CM | POA: Insufficient documentation

## 2017-09-13 DIAGNOSIS — Z87891 Personal history of nicotine dependence: Secondary | ICD-10-CM | POA: Insufficient documentation

## 2017-09-13 DIAGNOSIS — R110 Nausea: Secondary | ICD-10-CM | POA: Insufficient documentation

## 2017-09-13 DIAGNOSIS — J029 Acute pharyngitis, unspecified: Secondary | ICD-10-CM | POA: Diagnosis present

## 2017-09-13 DIAGNOSIS — I4892 Unspecified atrial flutter: Secondary | ICD-10-CM | POA: Insufficient documentation

## 2017-09-13 DIAGNOSIS — M6281 Muscle weakness (generalized): Secondary | ICD-10-CM | POA: Diagnosis not present

## 2017-09-13 DIAGNOSIS — R531 Weakness: Secondary | ICD-10-CM

## 2017-09-13 LAB — RAPID STREP SCREEN (MED CTR MEBANE ONLY): Streptococcus, Group A Screen (Direct): NEGATIVE

## 2017-09-13 LAB — CBC
HEMATOCRIT: 43.5 % (ref 36.0–46.0)
Hemoglobin: 14.6 g/dL (ref 12.0–15.0)
MCH: 32.4 pg (ref 26.0–34.0)
MCHC: 33.6 g/dL (ref 30.0–36.0)
MCV: 96.5 fL (ref 78.0–100.0)
PLATELETS: 196 10*3/uL (ref 150–400)
RBC: 4.51 MIL/uL (ref 3.87–5.11)
RDW: 12.9 % (ref 11.5–15.5)
WBC: 5.3 10*3/uL (ref 4.0–10.5)

## 2017-09-13 LAB — INFLUENZA PANEL BY PCR (TYPE A & B)
Influenza A By PCR: NEGATIVE
Influenza B By PCR: NEGATIVE

## 2017-09-13 LAB — BASIC METABOLIC PANEL
Anion gap: 10 (ref 5–15)
BUN: 14 mg/dL (ref 6–20)
CO2: 26 mmol/L (ref 22–32)
CREATININE: 0.71 mg/dL (ref 0.44–1.00)
Calcium: 10.1 mg/dL (ref 8.9–10.3)
Chloride: 103 mmol/L (ref 101–111)
GFR calc Af Amer: >60 mL/min (ref >60)
GLUCOSE: 116 mg/dL — AB (ref 65–99)
POTASSIUM: 4.7 mmol/L (ref 3.5–5.1)
SODIUM: 139 mmol/L (ref 135–145)

## 2017-09-13 LAB — HEPATIC FUNCTION PANEL
ALBUMIN: 4.2 g/dL (ref 3.5–5.0)
ALT: 25 U/L (ref 14–54)
AST: 32 U/L (ref 15–41)
Alkaline Phosphatase: 98 U/L (ref 38–126)
BILIRUBIN INDIRECT: 1 mg/dL — AB (ref 0.3–0.9)
Bilirubin, Direct: 0.2 mg/dL (ref 0.1–0.5)
TOTAL PROTEIN: 7.7 g/dL (ref 6.5–8.1)
Total Bilirubin: 1.2 mg/dL (ref 0.3–1.2)

## 2017-09-13 LAB — URINALYSIS, ROUTINE W REFLEX MICROSCOPIC
BILIRUBIN URINE: NEGATIVE
GLUCOSE, UA: NEGATIVE mg/dL
HGB URINE DIPSTICK: NEGATIVE
KETONES UR: NEGATIVE mg/dL
Leukocytes, UA: NEGATIVE
Nitrite: NEGATIVE
PH: 6 (ref 5.0–8.0)
PROTEIN: NEGATIVE mg/dL
Specific Gravity, Urine: 1.017 (ref 1.005–1.030)

## 2017-09-13 LAB — LIPASE, BLOOD: LIPASE: 44 U/L (ref 11–51)

## 2017-09-13 LAB — CBG MONITORING, ED: Glucose-Capillary: 130 mg/dL — ABNORMAL HIGH (ref 65–99)

## 2017-09-13 LAB — TROPONIN I: Troponin I: <0.03 ng/mL (ref <0.03)

## 2017-09-13 MED ORDER — ONDANSETRON 4 MG PO TBDP
4.0000 mg | ORAL_TABLET | Freq: Once | ORAL | Status: AC
  Administered 2017-09-13: 4 mg via ORAL
  Filled 2017-09-13: qty 1

## 2017-09-13 MED ORDER — ONDANSETRON 4 MG PO TBDP: 4.0000 mg | ORAL_TABLET | Freq: Three times a day (TID) | ORAL | 0 refills | Status: DC | PRN

## 2017-09-13 NOTE — ED Triage Notes (Signed)
Patient c/o generalized weakness and nausea x1 week. Patient states got worse last night. Patient recently treated for sinus infection with doxycycline-last dose last night. Patient states headache and sinus pressure last night increased. Per family patient's blood pressure and blood sugar fluctuating.

## 2017-09-13 NOTE — Discharge Instructions (Signed)
Take the prescription as directed. Increase your fluid intake (ie:  Gatoraide) for the next few days.  Eat a bland diet and advance to your regular diet slowly as you can tolerate it.  Use over the counter normal saline nasal spray with frequent nose blowing, several times per day for the next 2 weeks.  Call your regular medical doctor tomorrow to schedule a follow up appointment in the next 2 days. Return to the Emergency Department immediately if worsening.

## 2017-09-13 NOTE — ED Provider Notes (Signed)
[facility] Provider Note   CSN: [id number] Arrival date & time: 09/13/17  1008     History   Chief Complaint Chief Complaint  Patient presents with  . Fatigue    HPI Nancy Hodges is a 72 y.o. female.  HPI  Pt was seen at 1405.  Per pt, c/o gradual onset and persistence of constant sore throat, runny/stuffy nose, sinus congestion, and cough for the past 1 week. Has been associated with generalized fatigue and nausea.  Pt was evaluated at [facility] this past week, dx "sinus infection," rx doxycycline. Pt states she "doesn't feel better" while taking the antibiotic. Denies fevers, no rash, no CP/palpitations, no SOB, no vomiting/diarrhea, no abd pain, no back pain.    Past Medical History:  Diagnosis Date  . AICD (automatic cardioverter/defibrillator) present   . Allergic rhinitis   . Anxiety   . Arthritis   . Asthma   . Atrial flutter ([place])   . Cholelithiases 09/26/2015  . Diverticulosis   . Dysrhythmia   . Encounter for antineoplastic immunotherapy 12/09/2016  . GERD (gastroesophageal reflux disease)   . H/O hiatal hernia   . History of blood transfusion   . History of chemotherapy   . History of kidney stones   . History of radiation therapy   . Internal hemorrhoid   . Lung cancer ([place])    a. Stage IB non-small cell carcinoma, s/p R VATS, wedge resection, RU lobectomy 10/2012.  . Paroxysmal atrial fibrillation (HCC)    a. anticoagulated with Xarelto  . Pneumonia 06/2016   [facility]  . Presence of permanent cardiac pacemaker   . Pulmonary nodule   . Tachycardia-bradycardia syndrome ([place])    a. 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    Patient Active Problem List   Diagnosis Date Noted  . Port-A-Cath in place 07/07/2017  . Seborrheic keratosis 05/12/2017  . Nausea and vomiting 02/19/2017  . Weakness 02/19/2017  . SIRS (systemic inflammatory response syndrome) ([place]) 02/19/2017  . Goals of care, counseling/discussion 12/09/2016  .  Encounter for antineoplastic immunotherapy 12/09/2016  . Esophageal varices in other disease   . Hepatic cirrhosis ([place]) 01/09/2016  . Elevated LFTs 11/07/2015  . Cholelithiases 09/26/2015  . Melena   . GI bleed 07/29/2014  . Lung cancer, primary, with metastasis from lung to other site [facility]) 05/05/2014  . Arthralgia 10/05/2013  . Obstructive chronic bronchitis without exacerbation Gold A  02/11/2013  . Non-small cell carcinoma of right lung, stage 4 ([place]) 09/29/2012  . Tachycardia-bradycardia syndrome ([place]) 09/06/2012  . Paroxysmal atrial fibrillation [facility])     Past Surgical History:  Procedure Laterality Date  . CATARACT EXTRACTION W/PHACO Right 08/14/2016   Procedure: CATARACT EXTRACTION PHACO AND INTRAOCULAR LENS PLACEMENT RIGHT EYE CDE 10.53;  Surgeon: Tonny Branch, MD;  Location: [facility];  Service: Ophthalmology;  Laterality: Right;  right  . CATARACT EXTRACTION W/PHACO Left 09/25/2016   Procedure: CATARACT EXTRACTION PHACO AND INTRAOCULAR LENS PLACEMENT (IOC);  Surgeon: Tonny Branch, MD;  Location: [facility] ORS;  Service: Ophthalmology;  Laterality: Left;  left CDE 8.81  . COLONOSCOPY     [facility]: cattered sigmoid diverticula, internal hemorrhoids, sessile polyp in ascending and mid transverse colon (tubular adenoma).   . colonoscopy with polypectomy  2015  . ESOPHAGOGASTRODUODENOSCOPY     [facility]: bile reflux in stomach, mild gastritis, hiatal hernia  . ESOPHAGOGASTRODUODENOSCOPY N/A 01/31/2016   Procedure: ESOPHAGOGASTRODUODENOSCOPY (EGD);  Surgeon: [name], MD;  [facility];  Service: Endoscopy;  Laterality:  N/A;  1200  . IR FLUORO GUIDE PORT INSERTION RIGHT  06/30/2017  . IR US GUIDE VASC ACCESS RIGHT  06/30/2017  . LOBECTOMY Right 10/27/2012   Procedure: LOBECTOMY;  Surgeon: [name], MD;  Location: [place];  Service: Thoracic;  Laterality: Right;   RIGHT UPPER LOBECTOMY & Node Dissection  . LYMPH NODE BIOPSY Right 05/01/2014   Procedure: LYMPH NODE  BIOPSY, Right Axillary;  Surgeon: [name], MD;  Location: [place];  Service: Thoracic;  Laterality: Right;  . PARTIAL HYSTERECTOMY    . PERMANENT PACEMAKER INSERTION N/A 09/14/2012   Nanostim (SJM) leadless pacemaker (LEADLESS II STUDY PATIENT) implanted by Dr [name]  . VIDEO ASSISTED THORACOSCOPY (VATS)/WEDGE RESECTION Right 10/27/2012   Procedure: VIDEO ASSISTED THORACOSCOPY (VATS),RGHT UPPER LOBE LUNG WEDGE RESECTION;  Surgeon: [name], MD;  [facility];  Service: Thoracic;  Laterality: Right;    OB History    Gravida Para Term Preterm AB Living   1 1 1          SAB TAB Ectopic Multiple Live Births                   Home Medications    Prior to Admission medications   Medication Sig Start Date End Date Taking? Authorizing Provider  acetaminophen (TYLENOL) 500 MG tablet Take 500 mg by mouth every 6 (six) hours as needed for moderate pain or fever.     [provider]  albuterol (PROVENTIL HFA;VENTOLIN HFA) 108 (90 BASE) MCG/ACT inhaler Inhale 1-2 puffs into the lungs every 6 (six) hours as needed for wheezing or shortness of breath.    [provider]  cephALEXin (KEFLEX) 500 MG capsule Take 1 capsule (500 mg total) by mouth 4 (four) times daily. 06/02/17   Milton Ferguson, MD  diltiazem (CARDIZEM CD) 240 MG 24 hr capsule Take 1 capsule (240 mg total) by mouth daily. 03/30/17   Allred, Jeneen Rinks, MD  hydroxypropyl methylcellulose / hypromellose (ISOPTO TEARS / GONIOVISC) 2.5 % ophthalmic solution Place 1-2 drops into the left eye as needed for dry eyes.    [provider]  ipratropium-albuterol (DUONEB) 0.5-2.5 (3) MG/3ML SOLN Take 3 mLs by nebulization every 6 (six) hours as needed (shortness of breath or wheezing).     [provider]  lidocaine-prilocaine (EMLA) cream Apply 1 application topically as needed. 07/03/17   Curt Bears, MD  loratadine (CLARITIN) 10 MG tablet Take 10 mg by mouth daily.    [provider]    Multiple Vitamin (MULTIVITAMIN) tablet Take 1 tablet by mouth daily.    [provider]  rivaroxaban (XARELTO) 20 MG TABS tablet Take 1 tablet (20 mg total) by mouth daily with supper. 03/30/17   [name], MD    Family History Family History  Problem Relation Age of Onset  . Heart disease Mother   . Diabetes Mother   . Kidney cancer Mother   . Asthma Father   . Heart disease Father   . Pancreatic cancer Brother   . Diabetes Sister   . Lung cancer Sister   . Diabetes Brother   . Heart attack Son   . Colon cancer Neg Hx     Social History Social History   Tobacco Use  . Smoking status: Former Smoker    Packs/day: 2.00    Years: 40.00    Pack years: 80.00    Types: Cigarettes    Last attempt to quit: 08/05/1995    Years since quitting:  22.1  . Smokeless tobacco: Never Used  Substance Use Topics  . Alcohol use: No  . Drug use: No     Allergies   Chlorpheniramine-dm; Prednisone; Septra [sulfamethoxazole-trimethoprim]; Penicillins; and Vioxx [rofecoxib]   Review of Systems Review of Systems ROS: Statement: All systems negative except as marked or noted in the HPI; Constitutional: Negative for fever and chills. +generalized fatigue.; ; Eyes: Negative for eye pain, redness and discharge. ; ; ENMT: Negative for ear pain, hoarseness, +nasal congestion, sinus pressure and sore throat. ; ; Cardiovascular: Negative for chest pain, palpitations, diaphoresis, dyspnea and peripheral edema. ; ; Respiratory: +cough. Negative for wheezing and stridor. ; ; Gastrointestinal: +nausea. Negative for vomiting, diarrhea, abdominal pain, blood in stool, hematemesis, jaundice and rectal bleeding. . ; ; Genitourinary: Negative for dysuria, flank pain and hematuria. ; ; Musculoskeletal: Negative for back pain and neck pain. Negative for swelling and trauma.; ; Skin: Negative for pruritus, rash, abrasions, blisters, bruising and skin lesion.; ; Neuro: Negative for headache, lightheadedness  and neck stiffness. Negative for altered level of consciousness, altered mental status, extremity weakness, paresthesias, involuntary movement, seizure and syncope.      Physical Exam Updated Vital Signs BP 139/65 (BP Location: Left Arm)   Pulse 78   Temp 98 F (36.7 C) (Oral)   Resp 18   Ht 5\' 2"  (1.575 m)   Wt 68 kg (150 lb)   SpO2 96%   BMI 27.44 kg/m     14:45:24 Orthostatic Vital Signs RG  Orthostatic Lying   BP- Lying: 132/52  Pulse- Lying: 74      Orthostatic Sitting  BP- Sitting: 124/66  Pulse- Sitting: 84      Orthostatic Standing at 0 minutes  BP- Standing at 0 minutes: 146/60  Pulse- Standing at 0 minutes: 80     Physical Exam 1410: Physical examination:  Nursing notes reviewed; Vital signs and O2 SAT reviewed;  Constitutional: Well developed, Well nourished, Well hydrated, In no acute distress; Head:  Normocephalic, atraumatic; Eyes: EOMI, PERRL, No scleral icterus; ENMT: TM's clear bilat. +edemetous nasal turbinates bilat with clear rhinorrhea. Mouth and pharynx without lesions. No tonsillar exudates. No intra-oral edema. No submandibular or sublingual edema. No hoarse voice, no drooling, no stridor. No pain with manipulation of larynx. No trismus.  Mouth and pharynx normal, Mucous membranes moist; Neck: Supple, Full range of motion, No lymphadenopathy. No meningeal signs; Cardiovascular: Irregular rate and rhythm, No gallop; Respiratory: Breath sounds clear & equal bilaterally, No wheezes. Speaking full sentences with ease, Normal respiratory effort/excursion; Chest: Nontender, Movement normal; Abdomen: Soft, Nontender, Nondistended, Normal bowel sounds; Genitourinary: No CVA tenderness; Extremities: Pulses normal, No tenderness, No edema, No calf edema or asymmetry.; Neuro: AA&Ox3, Major CN grossly intact. No facial droop. Speech clear. No gross focal motor or sensory deficits in extremities.; Skin: Color normal, Warm, Dry.; Psych:  Affect flat.     ED  Treatments / Results  Labs (all labs ordered are listed, but only abnormal results are displayed)   EKG  EKG Interpretation  Date/Time:  Sunday September 13 2017 14:59:14 EST Ventricular Rate:  78 PR Interval:    QRS Duration: 98 QT Interval:  391 QTC Calculation: 446 R Axis:   64 Text Interpretation:  Atrial fibrillation When compared with ECG of 06/02/2017 No significant change was found Confirmed by Francine Graven [phone]) on 09/13/2017 3:05:56 PM       Radiology   Procedures Procedures (including critical care time)  Medications Ordered in ED Medications  ondansetron (  ZOFRAN-ODT) disintegrating tablet 4 mg (4 mg Oral Given 09/13/17 1444)     Initial Impression / Assessment and Plan / ED Course  I have reviewed the triage vital signs and the nursing notes.  Pertinent labs & imaging results that were available during my care of the patient were reviewed by me and considered in my medical decision making (see chart for details).  MDM Reviewed: previous chart, nursing note and vitals Reviewed previous: labs and ECG Interpretation: labs, ECG and x-ray    Results for orders placed or performed during the hospital encounter of 09/13/17  Rapid strep screen  Result Value Ref Range   Streptococcus, Group A Screen (Direct) NEGATIVE NEGATIVE  Basic metabolic panel  Result Value Ref Range   Sodium 139 135 - 145 mmol/L   Potassium 4.7 3.5 - 5.1 mmol/L   Chloride 103 101 - 111 mmol/L   CO2 26 22 - 32 mmol/L   Glucose, Bld 116 (H) 65 - 99 mg/dL   BUN 14 6 - 20 mg/dL   Creatinine, Ser 0.71 0.44 - 1.00 mg/dL   Calcium 10.1 8.9 - 10.3 mg/dL   GFR calc non Af Amer >60 >60 mL/min   GFR calc Af Amer >60 >60 mL/min   Anion gap 10 5 - 15  CBC  Result Value Ref Range   WBC 5.3 4.0 - 10.5 K/uL   RBC 4.51 3.87 - 5.11 MIL/uL   Hemoglobin 14.6 12.0 - 15.0 g/dL   HCT 43.5 36.0 - 46.0 %   MCV 96.5 78.0 - 100.0 fL   MCH 32.4 26.0 - 34.0 pg   MCHC 33.6 30.0 - 36.0 g/dL   RDW  12.9 11.5 - 15.5 %   Platelets 196 150 - 400 K/uL  Urinalysis, Routine w reflex microscopic  Result Value Ref Range   Color, Urine YELLOW YELLOW   APPearance CLEAR CLEAR   Specific Gravity, Urine 1.017 1.005 - 1.030   pH 6.0 5.0 - 8.0   Glucose, UA NEGATIVE NEGATIVE mg/dL   Hgb urine dipstick NEGATIVE NEGATIVE   Bilirubin Urine NEGATIVE NEGATIVE   Ketones, ur NEGATIVE NEGATIVE mg/dL   Protein, ur NEGATIVE NEGATIVE mg/dL   Nitrite NEGATIVE NEGATIVE   Leukocytes, UA NEGATIVE NEGATIVE  Influenza panel by PCR (type A & B)  Result Value Ref Range   Influenza A By PCR NEGATIVE NEGATIVE   Influenza B By PCR NEGATIVE NEGATIVE  Troponin I  Result Value Ref Range   Troponin I <0.03 <0.03 ng/mL  Lipase, blood  Result Value Ref Range   Lipase 44 11 - 51 U/L  Hepatic function panel  Result Value Ref Range   Total Protein 7.7 6.5 - 8.1 g/dL   Albumin 4.2 3.5 - 5.0 g/dL   AST 32 15 - 41 U/L   ALT 25 14 - 54 U/L   Alkaline Phosphatase 98 38 - 126 U/L   Total Bilirubin 1.2 0.3 - 1.2 mg/dL   Bilirubin, Direct 0.2 0.1 - 0.5 mg/dL   Indirect Bilirubin 1.0 (H) 0.3 - 0.9 mg/dL  CBG monitoring, ED  Result Value Ref Range   Glucose-Capillary 130 (H) 65 - 99 mg/dL   Dg Chest 2 View Result Date: 09/13/2017 CLINICAL DATA:  Cough and nausea EXAM: CHEST  2 VIEW COMPARISON:  Chest radiograph June 02, 2017 and chest CT August 31, 2017 FINDINGS: Port-A-Cath tip is in the superior vena cava. No pneumothorax. There is mild scarring on the right. There is no edema or consolidation. Heart  size and pulmonary vascularity are normal. No adenopathy. There is aortic atherosclerosis. There is a loop recorder on the left. No bone lesions are appreciable. IMPRESSION: No edema or consolidation. Scarring on the right. Port-A-Cath tip in superior vena cava. Aortic atherosclerosis. Aortic Atherosclerosis (ICD10-I70.0). Electronically Signed   By: Lowella Grip III M.D.   On: 09/13/2017 15:25     1540:  Not  orthostatic on VS. Pt has tol PO well while in the ED without N/V.  No stooling while in the ED.  Abd remains benign, VSS. Feels better and wants to go home now. Workup reassuring. No clear indication for admission at this time. Tx symptomatically. Dx and testing d/w pt and family.  Questions answered.  Verb understanding, agreeable to d/c home with outpt f/u.          Final Clinical Impressions(s) / ED Diagnoses   Final diagnoses:  None    ED Discharge Orders    None       Francine Graven, DO 09/16/17 1222

## 2017-09-13 NOTE — ED Notes (Signed)
Did not mean to click off urinalysis

## 2017-09-16 LAB — CULTURE, GROUP A STREP (THRC)

## 2017-09-28 ENCOUNTER — Other Ambulatory Visit: Payer: Self-pay

## 2017-09-28 ENCOUNTER — Encounter: Payer: Medicare Other | Admitting: Physician Assistant

## 2017-09-28 DIAGNOSIS — C3490 Malignant neoplasm of unspecified part of unspecified bronchus or lung: Secondary | ICD-10-CM

## 2017-09-28 NOTE — Progress Notes (Signed)
Cardiology Office Note Date:  09/30/2017  Patient ID:  Nancy Hodges, Nancy Hodges 05/07/1946, MRN [id number] PCP:  [name], [name]  Electrophysiologist:  Dr. [name]    Chief Complaint: planned 6 mo EP/device visit  History of Present Illness: Nancy Hodges is a 72 y.o. female with history of tachy-brady w/PPM (leadless), lung Ca, Stage IB non-small cell carcinoma, s/p R VATS, wedge resection, RU lobectomy 10/2012, asthma, anxiety, PAFib.  I last saw her in Aug 2018, he noted a hospital stay with sepsis in July w/negative blood cx,she was doing well.     She had an ER visit 09/13/17 with cough/cold type illness   She is feeling well, no CP.  [place]ford Rile she is very sedentary, though denies any difficulties with her ADLs, she is looking forward to summer and getting outside more.  She denies any dizziness, near syncope or syncope.  She feels sluggish after her diltiazem in the morning for a couple hours.  She infrequently feel palpitations "flutters".  She has had a couple sinus infections/viral.  She is good about her xarelto, though thinks she may have missed a dose a week or so ago.  No bleeding or signs of bleeding.  She c/w chemo.   Device information: STJ Leadless PPM implanted 09/14/12 for tachy-brady syndrome.    Past Medical History:  Diagnosis Date  . AICD (automatic cardioverter/defibrillator) present   . Allergic rhinitis   . Anxiety   . Arthritis   . Asthma   . Atrial flutter ([place])   . Cholelithiases 09/26/2015  . Diverticulosis   . Dysrhythmia   . Encounter for antineoplastic immunotherapy 12/09/2016  . GERD (gastroesophageal reflux disease)   . H/O hiatal hernia   . History of blood transfusion   . History of chemotherapy   . History of kidney stones   . History of radiation therapy   . Internal hemorrhoid   . Lung cancer ([place])    a. Stage IB non-small cell carcinoma, s/p R VATS, wedge resection, RU lobectomy 10/2012.  . Paroxysmal atrial fibrillation ([facility])     a. anticoagulated with Xarelto  . Pneumonia 06/2016   [facility]  . Presence of permanent cardiac pacemaker   . Pulmonary nodule   . Tachycardia-bradycardia syndrome ([place])    a. 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    Past Surgical History:  Procedure Laterality Date  . CATARACT EXTRACTION W/PHACO Right 08/14/2016   Procedure: CATARACT EXTRACTION PHACO AND INTRAOCULAR LENS PLACEMENT RIGHT EYE CDE 10.53;  Surgeon: Tonny Branch, MD;  [facility];  Service: Ophthalmology;  Laterality: Right;  right  . CATARACT EXTRACTION W/PHACO Left 09/25/2016   Procedure: CATARACT EXTRACTION PHACO AND INTRAOCULAR LENS PLACEMENT (IOC);  Surgeon: Tonny Branch, MD;  [facility];  Service: Ophthalmology;  Laterality: Left;  left CDE 8.81  . COLONOSCOPY     [facility]: cattered sigmoid diverticula, internal hemorrhoids, sessile polyp in ascending and mid transverse colon (tubular adenoma).   . colonoscopy with polypectomy  2015  . ESOPHAGOGASTRODUODENOSCOPY     [facility]: bile reflux in stomach, mild gastritis, hiatal hernia  . ESOPHAGOGASTRODUODENOSCOPY N/A 01/31/2016   Procedure: ESOPHAGOGASTRODUODENOSCOPY (EGD);  Surgeon: [name], MD;  [facility];  Service: Endoscopy;  Laterality: N/A;  1200  . IR FLUORO GUIDE PORT INSERTION RIGHT  06/30/2017  . IR US GUIDE VASC ACCESS RIGHT  06/30/2017  . LOBECTOMY Right 10/27/2012   Procedure: LOBECTOMY;  Surgeon: [name], MD;  Location: [place];  Service:  Thoracic;  Laterality: Right;   RIGHT UPPER LOBECTOMY & Node Dissection  . LYMPH NODE BIOPSY Right 05/01/2014   Procedure: LYMPH NODE BIOPSY, Right Axillary;  Surgeon: [name], MD;  Location: [place];  Service: Thoracic;  Laterality: Right;  . PARTIAL HYSTERECTOMY    . PERMANENT PACEMAKER INSERTION N/A 09/14/2012   Nanostim (SJM) leadless pacemaker (LEADLESS II STUDY PATIENT) implanted by Dr [name]  . VIDEO ASSISTED THORACOSCOPY (VATS)/WEDGE RESECTION Right  10/27/2012   Procedure: VIDEO ASSISTED THORACOSCOPY (VATS),RGHT UPPER LOBE LUNG WEDGE RESECTION;  Surgeon: [name], MD;  Location: [place];  Service: Thoracic;  Laterality: Right;    Current Outpatient Medications  Medication Sig Dispense Refill  . acetaminophen (TYLENOL) 500 MG tablet Take 500 mg by mouth every 6 (six) hours as needed for moderate pain or fever.     [name] albuterol (PROVENTIL HFA;VENTOLIN HFA) 108 (90 BASE) MCG/ACT inhaler Inhale 1-2 puffs into the lungs every 6 (six) hours as needed for wheezing or shortness of breath.    . diltiazem (CARDIZEM CD) 240 MG 24 hr capsule Take 1 capsule (240 mg total) by mouth daily. 90 capsule 3  . hydroxypropyl methylcellulose / hypromellose (ISOPTO TEARS / GONIOVISC) 2.5 % ophthalmic solution Place 1-2 drops into the left eye as needed for dry eyes.    [name] ipratropium-albuterol (DUONEB) 0.5-2.5 (3) MG/3ML SOLN Take 3 mLs by nebulization every 6 (six) hours as needed (shortness of breath or wheezing).     [name] lidocaine-prilocaine (EMLA) cream Apply 1 application topically as needed. 30 g 4  . loratadine (CLARITIN) 10 MG tablet Take 10 mg by mouth daily.    . Multiple Vitamin (MULTIVITAMIN) tablet Take 1 tablet by mouth daily.    . ondansetron (ZOFRAN ODT) 4 MG disintegrating tablet Take 1 tablet (4 mg total) by mouth every 8 (eight) hours as needed for nausea or vomiting. 6 tablet 0  . rivaroxaban (XARELTO) 20 MG TABS tablet Take 1 tablet (20 mg total) by mouth daily with supper. 30 tablet 11   No current facility-administered medications for this visit.     Allergies:   Chlorpheniramine-dm; Prednisone; Septra [sulfamethoxazole-trimethoprim]; Penicillins; and Vioxx [rofecoxib]   Social History:  The patient  reports that she quit smoking about 22 years ago. Her smoking use included cigarettes. She has a 80.00 pack-year smoking history. she has never used smokeless tobacco. She reports that she does not drink alcohol or use drugs.   Family  History:  The patient's family history includes Asthma in her father; Diabetes in her brother, mother, and sister; Heart attack in her son; Heart disease in her father and mother; Kidney cancer in her mother; Lung cancer in her sister; Pancreatic cancer in her brother.  ROS:  Please see the history of present illness.  All other systems are reviewed and otherwise negative.   PHYSICAL EXAM:  VS:  BP (!) 122/58   Pulse 87   Ht 5\' 2"  (1.575 m)   Wt 149 lb 12.8 oz (67.9 kg)   BMI 27.40 kg/m  BMI: Body mass index is 27.4 kg/m. Well nourished, well developed, in no acute distress  HEENT: normocephalic, atraumatic  Neck: no JVD, carotid bruits or masses Cardiac:   RRR; no significant murmurs, no rubs, or gallops Lungs:   CTA b/l , no wheezing, rhonchi or rales  Abd: soft, nontender Nancy: no deformity or  atrophy Ext:  no edema  Skin: warm and dry, no rash Neuro:  No gross deficits appreciated Psych: euthymic  mood, full affect  EKG:  Not done today PPM interrogation done today and personally reviewed  08/05/12: TTE Study Conclusions - Left ventricle: The cavity size was normal. There was mild focal basal hypertrophy of the septum. Systolic function was normal. The estimated ejection fraction was in the range of 55% to 60%. Wall motion was normal; there were no regional wall motion abnormalities. Doppler parameters are consistent with abnormal left ventricular relaxation (grade 1 diastolic dysfunction). - Aortic valve: Valve area: 1.91cm^2(VTI). Valve area: 2.08cm^2 (Vmax). - Mitral valve: Calcified annulus. Mild regurgitation. - Left atrium: The atrium was mildly dilated.  Recent Labs: 08/05/2017: TSH 1.792 09/13/2017: Hemoglobin 14.6 09/29/2017: ALT 36; BUN 12; Creatinine 0.80; Platelet Count 168; Potassium 4.0; Sodium 140  No results found for requested labs within last 8760 hours.   Estimated Creatinine Clearance: 56.6 mL/min (by C-G formula based on SCr of 0.8  mg/dL).   Wt Readings from Last 3 Encounters:  09/30/17 149 lb 12.8 oz (67.9 kg)  09/29/17 149 lb 9.6 oz (67.9 kg)  09/13/17 150 lb (68 kg)     Other studies reviewed: Additional studies/records reviewed today include: summarized above  ASSESSMENT AND PLAN:  1. PPM     During interrogation, Nancy Hodges LCP battery failed.  Device does not appear to be functioning at this time.  We had a long discussion about premature battery advisory.  We discussed options of conservative "watchful waiting", replacement with transvenous pacing system, or removal of leadless pacemaker with Micra implantation.  After careful conversation, she would like to think about these options further.  We discussed risks and benefits of each option.  As she is not device dependant or currently bradycardic, I think it is reasonably to give this some thought.  We can further discuss once she has had some time to think about it.  I would like for her to return to see me in 1 week for further conversations.  2. 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  A high level of decision making was required today.  Of note, I also spoke at length with Dr Lovena Le about patient options.  We discussed pros and cons of each approach in great detail today.  [name] MD, [facility] 09/30/2017 9:35 PM

## 2017-09-29 ENCOUNTER — Encounter: Payer: Self-pay | Admitting: Internal Medicine

## 2017-09-29 ENCOUNTER — Inpatient Hospital Stay: Payer: Medicare Other

## 2017-09-29 ENCOUNTER — Inpatient Hospital Stay: Payer: Medicare Other | Attending: Internal Medicine | Admitting: Internal Medicine

## 2017-09-29 ENCOUNTER — Telehealth: Payer: Self-pay | Admitting: Internal Medicine

## 2017-09-29 ENCOUNTER — Other Ambulatory Visit: Payer: Self-pay | Admitting: Medical Oncology

## 2017-09-29 ENCOUNTER — Encounter: Payer: Self-pay | Admitting: *Deleted

## 2017-09-29 VITALS — BP 150/63 | HR 73 | Temp 98.5°F | Resp 17 | Ht 62.0 in | Wt 149.6 lb

## 2017-09-29 DIAGNOSIS — C3411 Malignant neoplasm of upper lobe, right bronchus or lung: Secondary | ICD-10-CM | POA: Diagnosis present

## 2017-09-29 DIAGNOSIS — C3490 Malignant neoplasm of unspecified part of unspecified bronchus or lung: Secondary | ICD-10-CM

## 2017-09-29 DIAGNOSIS — I10 Essential (primary) hypertension: Secondary | ICD-10-CM

## 2017-09-29 DIAGNOSIS — C3491 Malignant neoplasm of unspecified part of right bronchus or lung: Secondary | ICD-10-CM

## 2017-09-29 DIAGNOSIS — Z5112 Encounter for antineoplastic immunotherapy: Secondary | ICD-10-CM | POA: Diagnosis present

## 2017-09-29 DIAGNOSIS — Z95828 Presence of other vascular implants and grafts: Secondary | ICD-10-CM

## 2017-09-29 DIAGNOSIS — C773 Secondary and unspecified malignant neoplasm of axilla and upper limb lymph nodes: Secondary | ICD-10-CM | POA: Diagnosis present

## 2017-09-29 LAB — CBC WITH DIFFERENTIAL (CANCER CENTER ONLY)
BASOS PCT: 1 %
Basophils Absolute: 0 10*3/uL (ref 0.0–0.1)
EOS PCT: 3 %
Eosinophils Absolute: 0.1 10*3/uL (ref 0.0–0.5)
HCT: 39.6 % (ref 34.8–46.6)
Hemoglobin: 13.7 g/dL (ref 11.6–15.9)
Lymphocytes Relative: 20 %
Lymphs Abs: 0.9 10*3/uL (ref 0.9–3.3)
MCH: 33.2 pg (ref 25.1–34.0)
MCHC: 34.6 g/dL (ref 31.5–36.0)
MCV: 95.9 fL (ref 79.5–101.0)
MONO ABS: 0.3 10*3/uL (ref 0.1–0.9)
MONOS PCT: 6 %
Neutro Abs: 3.1 10*3/uL (ref 1.5–6.5)
Neutrophils Relative %: 70 %
Platelet Count: 168 10*3/uL (ref 145–400)
RBC: 4.13 MIL/uL (ref 3.70–5.45)
RDW: 12.9 % (ref 11.2–14.5)
WBC Count: 4.3 10*3/uL (ref 3.9–10.3)

## 2017-09-29 LAB — CMP (CANCER CENTER ONLY)
ALBUMIN: 3.9 g/dL (ref 3.5–5.0)
ALT: 36 U/L (ref 0–55)
ANION GAP: 8 (ref 3–11)
AST: 32 U/L (ref 5–34)
Alkaline Phosphatase: 112 U/L (ref 40–150)
BUN: 12 mg/dL (ref 7–26)
CO2: 26 mmol/L (ref 22–29)
Calcium: 10 mg/dL (ref 8.4–10.4)
Chloride: 106 mmol/L (ref 98–109)
Creatinine: 0.8 mg/dL (ref 0.60–1.10)
GFR, Estimated: >60 mL/min (ref >60)
GLUCOSE: 130 mg/dL (ref 70–140)
POTASSIUM: 4 mmol/L (ref 3.5–5.1)
Sodium: 140 mmol/L (ref 136–145)
TOTAL PROTEIN: 7.1 g/dL (ref 6.4–8.3)
Total Bilirubin: 0.9 mg/dL (ref 0.2–1.2)

## 2017-09-29 MED ORDER — HEPARIN SOD (PORK) LOCK FLUSH 100 UNIT/ML IV SOLN
500.0000 [IU] | Freq: Once | INTRAVENOUS | Status: AC | PRN
  Administered 2017-09-29: 500 [IU]
  Filled 2017-09-29: qty 5

## 2017-09-29 MED ORDER — SODIUM CHLORIDE 0.9% FLUSH
10.0000 mL | INTRAVENOUS | Status: DC | PRN
  Administered 2017-09-29: 10 mL via INTRAVENOUS
  Filled 2017-09-29: qty 10

## 2017-09-29 MED ORDER — SODIUM CHLORIDE 0.9 % IV SOLN
480.0000 mg | Freq: Once | INTRAVENOUS | Status: AC
  Administered 2017-09-29: 480 mg via INTRAVENOUS
  Filled 2017-09-29: qty 48

## 2017-09-29 MED ORDER — PEGFILGRASTIM 6 MG/0.6ML ~~LOC~~ PSKT
PREFILLED_SYRINGE | SUBCUTANEOUS | Status: AC
  Filled 2017-09-29: qty 0.6

## 2017-09-29 MED ORDER — SODIUM CHLORIDE 0.9 % IV SOLN
Freq: Once | INTRAVENOUS | Status: AC
  Administered 2017-09-29: 12:00:00 via INTRAVENOUS

## 2017-09-29 MED ORDER — SODIUM CHLORIDE 0.9% FLUSH
10.0000 mL | INTRAVENOUS | Status: DC | PRN
  Administered 2017-09-29: 10 mL
  Filled 2017-09-29: qty 10

## 2017-09-29 NOTE — Progress Notes (Signed)
Oncology Nurse Navigator Documentation  Oncology Nurse Navigator Flowsheets 09/29/2017  Navigator Location [facility]  Navigator Encounter Type Clinic/MDCI spoke with patient today at clinic. She is doing well without complaints. She is continuing on treatment plan. I help educate on plan of care. She verbalized understanding.   Treatment Phase Treatment  Barriers/Navigation Needs Education  Education Other  Interventions Education  Education Method Verbal  Acuity Level 1  Time Spent with Patient 30

## 2017-09-29 NOTE — Progress Notes (Signed)
[place] [phone]) [phone]   [phone]  OFFICE PROGRESS NOTE  Harris, [name], [name] [address] [place] [address]  DIAGNOSIS: Metastatic non-small cell lung cancer, adenocarcinoma. This was initially diagnosed as stage IB (T2a, N0, M0) in March of 2014, status post right upper lobectomy with lymph node dissection but the patient has evidence for disease recurrence in the right axilla status post resection.  PRIOR THERAPY:  1) Status post right axillary lymph node biopsy on 05/01/2014 under the care of Dr. Roxan Hockey. 2) Systemic chemotherapy with carboplatin for AUC of 5 and Alimta 500 mg/M2 every 3 weeks. First cycle 05/31/2014. Status post 6 cycles. Last dose was given 09/22/2014. 3) palliative radiotherapy to the subcarinal lymph node under the care of Dr. Sondra Come. 4) Nivolumab 240 mg IV every 2 weeks. First dose 12/23/2016. Status post 6 cycles.  CURRENT THERAPY:  Nivolumab 480 mg IV every 4 weeks. First dose 03/17/2017. Status post 7 cycles.  INTERVAL HISTORY: Nancy Hodges 72 y.o. female returns to the clinic today for follow-up visit.  The patient is feeling fine today with no specific complaints. She had flulike symptoms a few weeks ago but she recovered fairly well.  She continues to tolerate her treatment with immunotherapy fairly well.  She denied having any chest pain, shortness of breath, cough or hemoptysis.  She denied having any weight loss or night sweats.  She has no nausea, vomiting, diarrhea or constipation.  She is here today for evaluation before starting cycle #8.   MEDICAL HISTORY: Past Medical History:  Diagnosis Date  . AICD (automatic cardioverter/defibrillator) present   . Allergic rhinitis   . Anxiety   . Arthritis   . Asthma   . Atrial flutter ([facility])   . Cholelithiases 09/26/2015  . Diverticulosis   . Dysrhythmia   . Encounter for antineoplastic immunotherapy 12/09/2016  . GERD (gastroesophageal reflux  disease)   . H/O hiatal hernia   . History of blood transfusion   . History of chemotherapy   . History of kidney stones   . History of radiation therapy   . Internal hemorrhoid   . Lung cancer ([facility])    a. Stage IB non-small cell carcinoma, s/p R VATS, wedge resection, RU lobectomy 10/2012.  . Paroxysmal atrial fibrillation ([facility])    a. anticoagulated with Xarelto  . Pneumonia 06/2016   [facility]  . Presence of permanent cardiac pacemaker   . Pulmonary nodule   . 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    ALLERGIES:  is allergic to chlorpheniramine-dm; prednisone; septra [sulfamethoxazole-trimethoprim]; penicillins; and vioxx [rofecoxib].  MEDICATIONS:  Current Outpatient Medications  Medication Sig Dispense Refill  . acetaminophen (TYLENOL) 500 MG tablet Take 500 mg by mouth every 6 (six) hours as needed for moderate pain or fever.     [name] albuterol (PROVENTIL HFA;VENTOLIN HFA) 108 (90 BASE) MCG/ACT inhaler Inhale 1-2 puffs into the lungs every 6 (six) hours as needed for wheezing or shortness of breath.    . diltiazem (CARDIZEM CD) 240 MG 24 hr capsule Take 1 capsule (240 mg total) by mouth daily. 90 capsule 3  . hydroxypropyl methylcellulose / hypromellose (ISOPTO TEARS / GONIOVISC) 2.5 % ophthalmic solution Place 1-2 drops into the left eye as needed for dry eyes.    [name] ipratropium-albuterol (DUONEB) 0.5-2.5 (3) MG/3ML SOLN Take 3 mLs by nebulization every 6 (six) hours as needed (shortness of breath or wheezing).     [name]  lidocaine-prilocaine (EMLA) cream Apply 1 application topically as needed. 30 g 4  . loratadine (CLARITIN) 10 MG tablet Take 10 mg by mouth daily.    . Multiple Vitamin (MULTIVITAMIN) tablet Take 1 tablet by mouth daily.    . rivaroxaban (XARELTO) 20 MG TABS tablet Take 1 tablet (20 mg total) by mouth daily with supper. 30 tablet 11  . ondansetron (ZOFRAN ODT) 4 MG disintegrating tablet Take 1 tablet (4 mg total) by  mouth every 8 (eight) hours as needed for nausea or vomiting. (Patient not taking: Reported on 09/29/2017) 6 tablet 0   No current facility-administered medications for this visit.     SURGICAL HISTORY:  Past Surgical History:  Procedure Laterality Date  . CATARACT EXTRACTION W/PHACO Right 08/14/2016   Procedure: CATARACT EXTRACTION PHACO AND INTRAOCULAR LENS PLACEMENT RIGHT EYE CDE 10.53;  Surgeon: Tonny Branch, MD;  Location: [facility];  Service: Ophthalmology;  Laterality: Right;  right  . CATARACT EXTRACTION W/PHACO Left 09/25/2016   Procedure: CATARACT EXTRACTION PHACO AND INTRAOCULAR LENS PLACEMENT (IOC);  Surgeon: Tonny Branch, MD;  Location: [facility];  Service: Ophthalmology;  Laterality: Left;  left CDE 8.81  . COLONOSCOPY     [facility]: cattered sigmoid diverticula, internal hemorrhoids, sessile polyp in ascending and mid transverse colon (tubular adenoma).   . colonoscopy with polypectomy  2015  . ESOPHAGOGASTRODUODENOSCOPY     [facility]: bile reflux in stomach, mild gastritis, hiatal hernia  . ESOPHAGOGASTRODUODENOSCOPY N/A 01/31/2016   Procedure: ESOPHAGOGASTRODUODENOSCOPY (EGD);  Surgeon: [name], MD;  Location: [facility];  Service: Endoscopy;  Laterality: N/A;  1200  . IR FLUORO GUIDE PORT INSERTION RIGHT  06/30/2017  . IR US GUIDE VASC ACCESS RIGHT  06/30/2017  . LOBECTOMY Right 10/27/2012   Procedure: LOBECTOMY;  Surgeon: [name], MD;  Location: [facility];  Service: Thoracic;  Laterality: Right;   RIGHT UPPER LOBECTOMY & Node Dissection  . LYMPH NODE BIOPSY Right 05/01/2014   Procedure: LYMPH NODE BIOPSY, Right Axillary;  Surgeon: [name], MD;  Location: [facility];  Service: Thoracic;  Laterality: Right;  . PARTIAL HYSTERECTOMY    . PERMANENT PACEMAKER INSERTION N/A 09/14/2012   Nanostim (SJM) leadless pacemaker (LEADLESS II STUDY PATIENT) implanted by Dr [name]  . VIDEO ASSISTED THORACOSCOPY (VATS)/WEDGE RESECTION Right 10/27/2012   Procedure: VIDEO ASSISTED  THORACOSCOPY (VATS),RGHT UPPER LOBE LUNG WEDGE RESECTION;  Surgeon: [name], MD;  Location: [facility];  Service: Thoracic;  Laterality: Right;    REVIEW OF SYSTEMS:  A comprehensive review of systems was negative.   PHYSICAL EXAMINATION: General appearance: alert, cooperative and no distress Head: Normocephalic, without obvious abnormality, atraumatic Neck: no adenopathy, no JVD, supple, symmetrical, trachea midline and thyroid not enlarged, symmetric, no tenderness/mass/nodules Lymph nodes: Cervical, supraclavicular, and axillary nodes normal. Resp: clear to auscultation bilaterally Back: symmetric, no curvature. ROM normal. No CVA tenderness. Cardio: regular rate and rhythm, S1, S2 normal, no murmur, click, rub or gallop GI: soft, non-tender; bowel sounds normal; no masses,  no organomegaly Extremities: extremities normal, atraumatic, no cyanosis or edema  ECOG PERFORMANCE STATUS: 1 - Symptomatic but completely ambulatory  Blood pressure (!) 150/63, pulse 73, temperature 98.5 F (36.9 C), temperature source Oral, resp. rate 17, height 5\' 2"  (1.575 m), weight 149 lb 9.6 oz (67.9 kg), SpO2 97 %.  LABORATORY DATA: Lab Results  Component Value Date   WBC 4.3 09/29/2017   HGB 14.6 09/13/2017   HCT 39.6 09/29/2017   MCV 95.9 09/29/2017   PLT  168 09/29/2017      Chemistry      Component Value Date/Time   NA 140 09/29/2017 0944   NA 139 08/05/2017 0902   K 4.0 09/29/2017 0944   K 4.1 08/05/2017 0902   CL 106 09/29/2017 0944   CO2 26 09/29/2017 0944   CO2 28 08/05/2017 0902   BUN 12 09/29/2017 0944   BUN 10.1 08/05/2017 0902   CREATININE 0.80 09/29/2017 0944   CREATININE 0.8 08/05/2017 0902   GLU 134 (H) 02/18/2017 1410      Component Value Date/Time   CALCIUM 10.0 09/29/2017 0944   CALCIUM 9.5 08/05/2017 0902   ALKPHOS 112 09/29/2017 0944   ALKPHOS 111 08/05/2017 0902   AST 32 09/29/2017 0944   AST 33 08/05/2017 0902   ALT 36 09/29/2017 0944   ALT 34  08/05/2017 0902   BILITOT 0.9 09/29/2017 0944   BILITOT 1.39 (H) 08/05/2017 0902       RADIOGRAPHIC STUDIES: Dg Chest 2 View  Result Date: 09/13/2017 CLINICAL DATA:  Cough and nausea EXAM: CHEST  2 VIEW COMPARISON:  Chest radiograph June 02, 2017 and chest CT August 31, 2017 FINDINGS: Port-A-Cath tip is in the superior vena cava. No pneumothorax. There is mild scarring on the right. There is no edema or consolidation. Heart size and pulmonary vascularity are normal. No adenopathy. There is aortic atherosclerosis. There is a loop recorder on the left. No bone lesions are appreciable. IMPRESSION: No edema or consolidation. Scarring on the right. Port-A-Cath tip in superior vena cava. Aortic atherosclerosis. Aortic Atherosclerosis (ICD10-I70.0). Electronically Signed   By: Lowella Grip III M.D.   On: 09/13/2017 15:25   Ct Chest W Contrast  Result Date: 08/31/2017 CLINICAL DATA:  Non-small cell lung cancer, ongoing chemotherapy, radiation therapy complete. Intermittent cough. Shortness of breath on exertion. EXAM: CT CHEST, ABDOMEN, AND PELVIS WITH CONTRAST TECHNIQUE: Multidetector CT imaging of the chest, abdomen and pelvis was performed following the standard protocol during bolus administration of intravenous contrast. CONTRAST:  178mL ISOVUE-300 IOPAMIDOL (ISOVUE-300) INJECTION 61% COMPARISON:  07/03/2017. FINDINGS: CT CHEST FINDINGS Cardiovascular: Right IJ Port-A-Cath terminates in the low SVC. Atherosclerotic calcification of the arterial vasculature, including coronary arteries. Heart size normal. No pericardial effusion. Mediastinum/Nodes: No pathologically enlarged mediastinal, hilar or axillary lymph nodes. Surgical clips in the right axilla. Esophagus is grossly unremarkable. Small hiatal hernia with gastroesophageal varices. Lungs/Pleura: Biapical pleuroparenchymal scarring. Mild centrilobular emphysema. Right upper lobectomy with associated postsurgical changes in the right  hemithorax and mild radiation scarring in the medial right lower lobe. Amorphous ground-glass in the apical left upper lobe is unchanged. Few scattered tiny pulmonary nodules are unchanged. No pleural fluid. Narrowing of the right middle lobe bronchus, as before. Airway is otherwise unremarkable. Musculoskeletal: No worrisome lytic or sclerotic lesions. CT ABDOMEN PELVIS FINDINGS Hepatobiliary: Liver margin is irregular. Stones are seen in the gallbladder. No biliary ductal dilatation. Pancreas: Negative. Spleen: Borderline enlarged. 12 mm low-attenuation lesion in the posterior aspect is unchanged and too small to characterize. Adrenals/Urinary Tract: Adrenal glands are unremarkable. Renal cortical scarring. Ureters are decompressed. Bladder is grossly unremarkable. Stomach/Bowel: Tiny hiatal hernia. Stomach is decompressed. Small bowel, appendix and colon are unremarkable. Vascular/Lymphatic: Atherosclerotic calcification of the arterial vasculature without abdominal aortic aneurysm. Gastroesophageal varices. Porta hepatis lymph nodes measure up to 8 mm in short axis. Abdominal retroperitoneal lymph nodes are not enlarged by CT size criteria. Reproductive: Hysterectomy.  No adnexal mass. Other: No free fluid. Mesenteries and peritoneum are otherwise unremarkable. Musculoskeletal: No  worrisome lytic or sclerotic lesions. Degenerative changes in the spine. IMPRESSION: 1. Right upper lobectomy without evidence of recurrent or metastatic disease. 2. Aortic atherosclerosis (ICD10-170.0). Coronary artery calcification. 3. Cirrhosis gastroesophageal varices. 4. Cholelithiasis. Electronically Signed   By: Lorin Picket M.D.   On: 08/31/2017 11:27   Ct Abdomen Pelvis W Contrast  Result Date: 08/31/2017 CLINICAL DATA:  Non-small cell lung cancer, ongoing chemotherapy, radiation therapy complete. Intermittent cough. Shortness of breath on exertion. EXAM: CT CHEST, ABDOMEN, AND PELVIS WITH CONTRAST TECHNIQUE:  Multidetector CT imaging of the chest, abdomen and pelvis was performed following the standard protocol during bolus administration of intravenous contrast. CONTRAST:  113mL ISOVUE-300 IOPAMIDOL (ISOVUE-300) INJECTION 61% COMPARISON:  07/03/2017. FINDINGS: CT CHEST FINDINGS Cardiovascular: Right IJ Port-A-Cath terminates in the low SVC. Atherosclerotic calcification of the arterial vasculature, including coronary arteries. Heart size normal. No pericardial effusion. Mediastinum/Nodes: No pathologically enlarged mediastinal, hilar or axillary lymph nodes. Surgical clips in the right axilla. Esophagus is grossly unremarkable. Small hiatal hernia with gastroesophageal varices. Lungs/Pleura: Biapical pleuroparenchymal scarring. Mild centrilobular emphysema. Right upper lobectomy with associated postsurgical changes in the right hemithorax and mild radiation scarring in the medial right lower lobe. Amorphous ground-glass in the apical left upper lobe is unchanged. Few scattered tiny pulmonary nodules are unchanged. No pleural fluid. Narrowing of the right middle lobe bronchus, as before. Airway is otherwise unremarkable. Musculoskeletal: No worrisome lytic or sclerotic lesions. CT ABDOMEN PELVIS FINDINGS Hepatobiliary: Liver margin is irregular. Stones are seen in the gallbladder. No biliary ductal dilatation. Pancreas: Negative. Spleen: Borderline enlarged. 12 mm low-attenuation lesion in the posterior aspect is unchanged and too small to characterize. Adrenals/Urinary Tract: Adrenal glands are unremarkable. Renal cortical scarring. Ureters are decompressed. Bladder is grossly unremarkable. Stomach/Bowel: Tiny hiatal hernia. Stomach is decompressed. Small bowel, appendix and colon are unremarkable. Vascular/Lymphatic: Atherosclerotic calcification of the arterial vasculature without abdominal aortic aneurysm. Gastroesophageal varices. Porta hepatis lymph nodes measure up to 8 mm in short axis. Abdominal retroperitoneal  lymph nodes are not enlarged by CT size criteria. Reproductive: Hysterectomy.  No adnexal mass. Other: No free fluid. Mesenteries and peritoneum are otherwise unremarkable. Musculoskeletal: No worrisome lytic or sclerotic lesions. Degenerative changes in the spine. IMPRESSION: 1. Right upper lobectomy without evidence of recurrent or metastatic disease. 2. Aortic atherosclerosis (ICD10-170.0). Coronary artery calcification. 3. Cirrhosis gastroesophageal varices. 4. Cholelithiasis. Electronically Signed   By: Lorin Picket M.D.   On: 08/31/2017 11:27   ASSESSMENT AND PLAN:  This is a very pleasant 71 years old white female with metastatic non-small cell lung cancer, adenocarcinoma with no actionable mutations status post systemic chemotherapy with carboplatin and Alimta and she is currently undergoing treatment with second line immunotherapy with Nivolumab status post 6 cycles. This was followed by immunotherapy with Nivolumab 480 mg IV every 4 weeks status post 7 cycles. The patient continues to tolerate this treatment well with no concerning complaints. I recommended for her to proceed with cycle #8 today as a scheduled. I will see her back for follow-up visit in 4 weeks for evaluation before the next cycle of her treatment. For hypertension, she will continue to monitor her blood pressure closely at home and to report to her primary care physician for adjustment of her medication if needed. The patient was advised to call immediately if she has any concerning symptoms in the interval. The patient voices understanding of current disease status and treatment options and is in agreement with the current care plan. All questions were answered. The patient knows  to call the clinic with any problems, questions or concerns. We can certainly see the patient much sooner if necessary.  Disclaimer: This note was dictated with voice recognition software. Similar sounding words can inadvertently be transcribed  and may not be corrected upon review.

## 2017-09-29 NOTE — Telephone Encounter (Signed)
AVS/Calendar printed .  Appointments were already scheduled/ per 2/26 los

## 2017-09-29 NOTE — Patient Instructions (Signed)
[facility] Discharge Instructions for Patients Receiving Chemotherapy  Today you received the following chemotherapy agent: Opdivo  To help prevent nausea and vomiting after your treatment, we encourage you to take your nausea medication as directed   If you develop nausea and vomiting that is not controlled by your nausea medication, call the clinic.   BELOW ARE SYMPTOMS THAT SHOULD BE REPORTED IMMEDIATELY:  *FEVER GREATER THAN 100.5 F  *CHILLS WITH OR WITHOUT FEVER  NAUSEA AND VOMITING THAT IS NOT CONTROLLED WITH YOUR NAUSEA MEDICATION  *UNUSUAL SHORTNESS OF BREATH  *UNUSUAL BRUISING OR BLEEDING  TENDERNESS IN MOUTH AND THROAT WITH OR WITHOUT PRESENCE OF ULCERS  *URINARY PROBLEMS  *BOWEL PROBLEMS  UNUSUAL RASH Items with * indicate a potential emergency and should be followed up as soon as possible.  Feel free to call the clinic should you have any questions or concerns. The clinic phone number is [phone].  Please show the CHEMO ALERT CARD at check-in to the Emergency Department and triage nurse.   

## 2017-09-30 ENCOUNTER — Ambulatory Visit (INDEPENDENT_AMBULATORY_CARE_PROVIDER_SITE_OTHER): Payer: Medicare Other | Admitting: Internal Medicine

## 2017-09-30 ENCOUNTER — Encounter: Payer: Self-pay | Admitting: Physician Assistant

## 2017-09-30 VITALS — BP 122/58 | HR 87 | Ht 62.0 in | Wt 149.8 lb

## 2017-09-30 DIAGNOSIS — I495 Sick sinus syndrome: Secondary | ICD-10-CM | POA: Diagnosis not present

## 2017-09-30 DIAGNOSIS — I480 Paroxysmal atrial fibrillation: Secondary | ICD-10-CM | POA: Diagnosis not present

## 2017-09-30 DIAGNOSIS — Z950 Presence of cardiac pacemaker: Secondary | ICD-10-CM

## 2017-09-30 DIAGNOSIS — Z45010 Encounter for checking and testing of cardiac pacemaker pulse generator [battery]: Secondary | ICD-10-CM

## 2017-09-30 NOTE — Patient Instructions (Addendum)
Medication Instructions:  Your physician recommends that you continue on your current medications as directed. Please refer to the Current Medication list given to you today.  Labwork: None ordered.  Testing/Procedures: None ordered.  Follow-Up: Your physician wants you to follow-up with Dr. [name] on October 05, 2017 @ 10:30 am.  Any Other Special Instructions Will Be Listed Below (If Applicable).  If you need a refill on your cardiac medications before your next appointment, please call your pharmacy.

## 2017-10-05 ENCOUNTER — Ambulatory Visit (INDEPENDENT_AMBULATORY_CARE_PROVIDER_SITE_OTHER): Payer: Medicare Other | Admitting: Internal Medicine

## 2017-10-05 ENCOUNTER — Encounter: Payer: Self-pay | Admitting: Internal Medicine

## 2017-10-05 VITALS — BP 124/60 | HR 80 | Ht 62.0 in | Wt 150.0 lb

## 2017-10-05 DIAGNOSIS — I480 Paroxysmal atrial fibrillation: Secondary | ICD-10-CM

## 2017-10-05 DIAGNOSIS — R079 Chest pain, unspecified: Secondary | ICD-10-CM

## 2017-10-05 DIAGNOSIS — I495 Sick sinus syndrome: Secondary | ICD-10-CM

## 2017-10-05 DIAGNOSIS — Z45010 Encounter for checking and testing of cardiac pacemaker pulse generator [battery]: Secondary | ICD-10-CM | POA: Diagnosis not present

## 2017-10-05 NOTE — Patient Instructions (Addendum)
Medication Instructions:  Your physician recommends that you continue on your current medications as directed. Please refer to the Current Medication list given to you today.  Labwork: You will get lab work at the [facility] prior to your procedure (I will call).  Testing/Procedures:  Your physician has requested that you have an echocardiogram. Echocardiography is a painless test that uses sound waves to create images of your heart. It provides your doctor with information about the size and shape of your heart and how well your heart's chambers and valves are working. This procedure takes approximately one hour. There are no restrictions for this procedure.  Please schedule prior to pacemaker on October 30, 2017.  Your physician has recommended that you have a pacemaker inserted. A pacemaker is a small device that is placed under the skin of your chest or abdomen to help control abnormal heart rhythms. This device uses electrical pulses to prompt the heart to beat at a normal rate. Pacemakers are used to treat heart rhythms that are too slow. Wire (leads) are attached to the pacemaker that goes into the chambers of you heart. This is done in the hospital and usually requires and overnight stay. Please see the instruction sheet given to you today for more information.  Follow-Up: You will follow up with device clinic 10-14 days after your procedure for a wound check.  You will follow up with Dr. [name] 91 days after your procedure.  Any Other Special Instructions Will Be Listed Below (If Applicable).  Please arrive at the [facility] main entrance of [facility] hospital at:  1:30 pm on October 30, 2017. Use the CHG surgical scrub as directed. Do not eat or drink after 7:00 am on the morning of your procedure. Do not take any medications the morning of the procedure.  Take your last dose of Xarelto on October 28, 2017.   Plan for one night stay.  You will need someone to drive you home at  discharge.  If you need a refill on your cardiac medications before your next appointment, please call your pharmacy.

## 2017-10-05 NOTE — Progress Notes (Signed)
PCP: [name], [name]   Primary EP: Dr Lillette Boxer Nancy Hodges is a 72 y.o. female who presents today for routine electrophysiology followup.  Since last being seen in our clinic, the patient reports doing reasonably well.  She has occasional SOB.  She also has chest discomfort.  She describes pain at rest with radiation into her neck.  She also has occasional dizziness which she attributes to vertigo. Today, she denies symptoms of palpitations,   lower extremity edema,  presyncope, or syncope.  The patient is otherwise without complaint today.   Past Medical History:  Diagnosis Date  . Allergic rhinitis   . Anxiety   . Arthritis   . Asthma   . Atrial flutter ([place])   . Cholelithiases 09/26/2015  . Diverticulosis   . Encounter for antineoplastic immunotherapy 12/09/2016  . GERD (gastroesophageal reflux disease)   . H/O hiatal hernia   . History of blood transfusion   . History of chemotherapy   . History of kidney stones   . History of radiation therapy   . Internal hemorrhoid   . Lung cancer ([place])    a. Stage IB non-small cell carcinoma, s/p R VATS, wedge resection, RU lobectomy 10/2012.  . Paroxysmal atrial fibrillation (HCC)    a. anticoagulated with Xarelto  . Pneumonia 06/2016   [facility]  . Presence of permanent cardiac pacemaker   . Pulmonary nodule   . Tachycardia-bradycardia syndrome ([place])    a. 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   Past Surgical History:  Procedure Laterality Date  . CATARACT EXTRACTION W/PHACO Right 08/14/2016   Procedure: CATARACT EXTRACTION PHACO AND INTRAOCULAR LENS PLACEMENT RIGHT EYE CDE 10.53;  Surgeon: Tonny Branch, MD;  [facility]: [facility];  Service: Ophthalmology;  Laterality: Right;  right  . CATARACT EXTRACTION W/PHACO Left 09/25/2016   Procedure: CATARACT EXTRACTION PHACO AND INTRAOCULAR LENS PLACEMENT (IOC);  Surgeon: Tonny Branch, MD;  [facility]: [facility];  Service: Ophthalmology;  Laterality: Left;  left CDE 8.81  .  COLONOSCOPY     [facility]: cattered sigmoid diverticula, internal hemorrhoids, sessile polyp in ascending and mid transverse colon (tubular adenoma).   . colonoscopy with polypectomy  2015  . ESOPHAGOGASTRODUODENOSCOPY     [facility]: bile reflux in stomach, mild gastritis, hiatal hernia  . ESOPHAGOGASTRODUODENOSCOPY N/A 01/31/2016   Procedure: ESOPHAGOGASTRODUODENOSCOPY (EGD);  Surgeon: [name], MD;  [facility];  Service: Endoscopy;  Laterality: N/A;  1200  . IR FLUORO GUIDE PORT INSERTION RIGHT  06/30/2017  . IR US GUIDE VASC ACCESS RIGHT  06/30/2017  . LOBECTOMY Right 10/27/2012   Procedure: LOBECTOMY;  Surgeon: [name], MD;  Location: [place];  Service: Thoracic;  Laterality: Right;   RIGHT UPPER LOBECTOMY & Node Dissection  . LYMPH NODE BIOPSY Right 05/01/2014   Procedure: LYMPH NODE BIOPSY, Right Axillary;  Surgeon: [name], MD;  Location: [place];  Service: Thoracic;  Laterality: Right;  . PARTIAL HYSTERECTOMY    . PERMANENT PACEMAKER INSERTION N/A 09/14/2012   Nanostim (SJM) leadless pacemaker (LEADLESS II STUDY PATIENT) implanted by Dr [name]  . VIDEO ASSISTED THORACOSCOPY (VATS)/WEDGE RESECTION Right 10/27/2012   Procedure: VIDEO ASSISTED THORACOSCOPY (VATS),RGHT UPPER LOBE LUNG WEDGE RESECTION;  Surgeon: [name], MD;  Location: [place];  Service: Thoracic;  Laterality: Right;    ROS- all systems are reviewed and negatives except as per HPI above  Current Outpatient Medications  Medication Sig Dispense Refill  . acetaminophen (TYLENOL) 500 MG tablet Take  500 mg by mouth every 6 (six) hours as needed for moderate pain or fever.     [name] albuterol (PROVENTIL HFA;VENTOLIN HFA) 108 (90 BASE) MCG/ACT inhaler Inhale 1-2 puffs into the lungs every 6 (six) hours as needed for wheezing or shortness of breath.    . diltiazem (CARDIZEM CD) 240 MG 24 hr capsule Take 1 capsule (240 mg total) by mouth daily. 90 capsule 3  . hydroxypropyl  methylcellulose / hypromellose (ISOPTO TEARS / GONIOVISC) 2.5 % ophthalmic solution Place 1-2 drops into the left eye as needed for dry eyes.    [name] ipratropium-albuterol (DUONEB) 0.5-2.5 (3) MG/3ML SOLN Take 3 mLs by nebulization every 6 (six) hours as needed (shortness of breath or wheezing).     [name] lidocaine-prilocaine (EMLA) cream Apply 1 application topically as needed. 30 g 4  . loratadine (CLARITIN) 10 MG tablet Take 10 mg by mouth daily.    . Multiple Vitamin (MULTIVITAMIN) tablet Take 1 tablet by mouth daily.    . ondansetron (ZOFRAN ODT) 4 MG disintegrating tablet Take 1 tablet (4 mg total) by mouth every 8 (eight) hours as needed for nausea or vomiting. 6 tablet 0  . rivaroxaban (XARELTO) 20 MG TABS tablet Take 1 tablet (20 mg total) by mouth daily with supper. 30 tablet 11   No current facility-administered medications for this visit.     Physical Exam: Vitals:   10/05/17 1115  BP: 124/60  Pulse: 80  SpO2: 98%  Weight: 150 lb (68 kg)  Height: 5\' 2"  (1.575 m)    GEN- The patient is well appearing, alert and oriented x 3 today.   Head- normocephalic, atraumatic Eyes-  Sclera clear, conjunctiva pink Ears- hearing intact Oropharynx- clear Lungs- Clear to ausculation bilaterally, normal work of breathing Heart- Regular rate and rhythm, no murmurs, rubs or gallops, PMI not laterally displaced GI- soft, NT, ND, + BS Extremities- no clubbing, cyanosis, or edema   Assessment and Plan:  1. Tachy/brady syndrome Leadless pacemaker has failed The patient has symptomatic bradycardia.  I would therefore recommend pacemaker implantation at this time.  We discussed micra as well as transvenous pacing with her at length.  Given risks, I would advise transvenous PPM as our next step.  Risks, benefits, alternatives to pacemaker implantation were discussed in detail with the patient today. The patient understands that the risks include but are not limited to bleeding, infection, pneumothorax,  perforation, tamponade, vascular damage, renal failure, MI, stroke, death,  and lead dislodgement and wishes to proceed. I have advised urgent PPM.  She wishes to wait and have this done when her daughter is avaiable (she is a Education officer, museum).  2. Paroxysmal atrial fibrillation On xarelto (chads2vasc score is 3)  3. Chest pain Unclear etiology Both typical and atypical features Will proceed with lexiscan myoview to exclude ischemia as the cause.  [name] MD, [facility] 10/05/2017 11:25 AM

## 2017-10-05 NOTE — H&P (View-Only) (Signed)
PCP: [name], [name]   Primary EP: Dr Lillette Boxer Nancy Hodges is a 72 y.o. female who presents today for routine electrophysiology followup.  Since last being seen in our clinic, the patient reports doing reasonably well.  She has occasional SOB.  She also has chest discomfort.  She describes pain at rest with radiation into her neck.  She also has occasional dizziness which she attributes to vertigo. Today, she denies symptoms of palpitations,   lower extremity edema,  presyncope, or syncope.  The patient is otherwise without complaint today.   Past Medical History:  Diagnosis Date  . Allergic rhinitis   . Anxiety   . Arthritis   . Asthma   . Atrial flutter ([place])   . Cholelithiases 09/26/2015  . Diverticulosis   . Encounter for antineoplastic immunotherapy 12/09/2016  . GERD (gastroesophageal reflux disease)   . H/O hiatal hernia   . History of blood transfusion   . History of chemotherapy   . History of kidney stones   . History of radiation therapy   . Internal hemorrhoid   . Lung cancer ([place])    a. Stage IB non-small cell carcinoma, s/p R VATS, wedge resection, RU lobectomy 10/2012.  . Paroxysmal atrial fibrillation (HCC)    a. anticoagulated with Xarelto  . Pneumonia 06/2016   [facility]  . Presence of permanent cardiac pacemaker   . Pulmonary nodule   . Tachycardia-bradycardia syndrome ([place])    a. 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   Past Surgical History:  Procedure Laterality Date  . CATARACT EXTRACTION W/PHACO Right 08/14/2016   Procedure: CATARACT EXTRACTION PHACO AND INTRAOCULAR LENS PLACEMENT RIGHT EYE CDE 10.53;  Surgeon: Tonny Branch, MD;  [facility];  Service: Ophthalmology;  Laterality: Right;  right  . CATARACT EXTRACTION W/PHACO Left 09/25/2016   Procedure: CATARACT EXTRACTION PHACO AND INTRAOCULAR LENS PLACEMENT (IOC);  Surgeon: Tonny Branch, MD;  [facility];  Service: Ophthalmology;  Laterality: Left;  left CDE 8.81  .  COLONOSCOPY     [facility]: cattered sigmoid diverticula, internal hemorrhoids, sessile polyp in ascending and mid transverse colon (tubular adenoma).   . colonoscopy with polypectomy  2015  . ESOPHAGOGASTRODUODENOSCOPY     [facility]: bile reflux in stomach, mild gastritis, hiatal hernia  . ESOPHAGOGASTRODUODENOSCOPY N/A 01/31/2016   Procedure: ESOPHAGOGASTRODUODENOSCOPY (EGD);  Surgeon: [name], MD;  [facility];  Service: Endoscopy;  Laterality: N/A;  1200  . IR FLUORO GUIDE PORT INSERTION RIGHT  06/30/2017  . IR US GUIDE VASC ACCESS RIGHT  06/30/2017  . LOBECTOMY Right 10/27/2012   Procedure: LOBECTOMY;  Surgeon: [name], MD;  Location: [place];  Service: Thoracic;  Laterality: Right;   RIGHT UPPER LOBECTOMY & Node Dissection  . LYMPH NODE BIOPSY Right 05/01/2014   Procedure: LYMPH NODE BIOPSY, Right Axillary;  Surgeon: [name], MD;  Location: [place];  Service: Thoracic;  Laterality: Right;  . PARTIAL HYSTERECTOMY    . PERMANENT PACEMAKER INSERTION N/A 09/14/2012   Nanostim (SJM) leadless pacemaker (LEADLESS II STUDY PATIENT) implanted by Dr [name]  . VIDEO ASSISTED THORACOSCOPY (VATS)/WEDGE RESECTION Right 10/27/2012   Procedure: VIDEO ASSISTED THORACOSCOPY (VATS),RGHT UPPER LOBE LUNG WEDGE RESECTION;  Surgeon: [name], MD;  Location: [place];  Service: Thoracic;  Laterality: Right;    ROS- all systems are reviewed and negatives except as per HPI above  Current Outpatient Medications  Medication Sig Dispense Refill  . acetaminophen (TYLENOL) 500 MG tablet Take  500 mg by mouth every 6 (six) hours as needed for moderate pain or fever.     [name] albuterol (PROVENTIL HFA;VENTOLIN HFA) 108 (90 BASE) MCG/ACT inhaler Inhale 1-2 puffs into the lungs every 6 (six) hours as needed for wheezing or shortness of breath.    . diltiazem (CARDIZEM CD) 240 MG 24 hr capsule Take 1 capsule (240 mg total) by mouth daily. 90 capsule 3  . hydroxypropyl  methylcellulose / hypromellose (ISOPTO TEARS / GONIOVISC) 2.5 % ophthalmic solution Place 1-2 drops into the left eye as needed for dry eyes.    [name] ipratropium-albuterol (DUONEB) 0.5-2.5 (3) MG/3ML SOLN Take 3 mLs by nebulization every 6 (six) hours as needed (shortness of breath or wheezing).     [name] lidocaine-prilocaine (EMLA) cream Apply 1 application topically as needed. 30 g 4  . loratadine (CLARITIN) 10 MG tablet Take 10 mg by mouth daily.    . Multiple Vitamin (MULTIVITAMIN) tablet Take 1 tablet by mouth daily.    . ondansetron (ZOFRAN ODT) 4 MG disintegrating tablet Take 1 tablet (4 mg total) by mouth every 8 (eight) hours as needed for nausea or vomiting. 6 tablet 0  . rivaroxaban (XARELTO) 20 MG TABS tablet Take 1 tablet (20 mg total) by mouth daily with supper. 30 tablet 11   No current facility-administered medications for this visit.     Physical Exam: Vitals:   10/05/17 1115  BP: 124/60  Pulse: 80  SpO2: 98%  Weight: 150 lb (68 kg)  Height: 5\' 2"  (1.575 m)    GEN- The patient is well appearing, alert and oriented x 3 today.   Head- normocephalic, atraumatic Eyes-  Sclera clear, conjunctiva pink Ears- hearing intact Oropharynx- clear Lungs- Clear to ausculation bilaterally, normal work of breathing Heart- Regular rate and rhythm, no murmurs, rubs or gallops, PMI not laterally displaced GI- soft, NT, ND, + BS Extremities- no clubbing, cyanosis, or edema   Assessment and Plan:  1. Tachy/brady syndrome Leadless pacemaker has failed The patient has symptomatic bradycardia.  I would therefore recommend pacemaker implantation at this time.  We discussed micra as well as transvenous pacing with her at length.  Given risks, I would advise transvenous PPM as our next step.  Risks, benefits, alternatives to pacemaker implantation were discussed in detail with the patient today. The patient understands that the risks include but are not limited to bleeding, infection, pneumothorax,  perforation, tamponade, vascular damage, renal failure, MI, stroke, death,  and lead dislodgement and wishes to proceed. I have advised urgent PPM.  She wishes to wait and have this done when her daughter is avaiable (she is a Education officer, museum).  2. Paroxysmal atrial fibrillation On xarelto (chads2vasc score is 3)  3. Chest pain Unclear etiology Both typical and atypical features Will proceed with lexiscan myoview to exclude ischemia as the cause.  [name] MD, [facility] 10/05/2017 11:25 AM

## 2017-10-08 ENCOUNTER — Telehealth: Payer: Self-pay | Admitting: Medical Oncology

## 2017-10-08 NOTE — Telephone Encounter (Signed)
Pt scheduled for pacemaker 3/29. Request we get labs. I told her pt will have the labs drawn 3/26

## 2017-10-27 ENCOUNTER — Inpatient Hospital Stay: Payer: Medicare Other

## 2017-10-27 ENCOUNTER — Inpatient Hospital Stay: Payer: Medicare Other | Attending: Internal Medicine | Admitting: Internal Medicine

## 2017-10-27 ENCOUNTER — Ambulatory Visit: Payer: Medicare Other | Admitting: Internal Medicine

## 2017-10-27 ENCOUNTER — Telehealth: Payer: Self-pay | Admitting: Internal Medicine

## 2017-10-27 ENCOUNTER — Ambulatory Visit: Payer: Medicare Other

## 2017-10-27 ENCOUNTER — Encounter: Payer: Self-pay | Admitting: Internal Medicine

## 2017-10-27 DIAGNOSIS — C773 Secondary and unspecified malignant neoplasm of axilla and upper limb lymph nodes: Secondary | ICD-10-CM | POA: Diagnosis not present

## 2017-10-27 DIAGNOSIS — C3491 Malignant neoplasm of unspecified part of right bronchus or lung: Secondary | ICD-10-CM

## 2017-10-27 DIAGNOSIS — Z5112 Encounter for antineoplastic immunotherapy: Secondary | ICD-10-CM | POA: Insufficient documentation

## 2017-10-27 DIAGNOSIS — R0982 Postnasal drip: Secondary | ICD-10-CM

## 2017-10-27 DIAGNOSIS — Z79899 Other long term (current) drug therapy: Secondary | ICD-10-CM | POA: Diagnosis not present

## 2017-10-27 DIAGNOSIS — Z95828 Presence of other vascular implants and grafts: Secondary | ICD-10-CM

## 2017-10-27 DIAGNOSIS — C3411 Malignant neoplasm of upper lobe, right bronchus or lung: Secondary | ICD-10-CM

## 2017-10-27 DIAGNOSIS — C3490 Malignant neoplasm of unspecified part of unspecified bronchus or lung: Secondary | ICD-10-CM

## 2017-10-27 LAB — CBC WITH DIFFERENTIAL (CANCER CENTER ONLY)
BASOS ABS: 0 10*3/uL (ref 0.0–0.1)
BASOS PCT: 1 %
Eosinophils Absolute: 0.1 10*3/uL (ref 0.0–0.5)
Eosinophils Relative: 2 %
HEMATOCRIT: 39.5 % (ref 34.8–46.6)
Hemoglobin: 13.7 g/dL (ref 11.6–15.9)
Lymphocytes Relative: 19 %
Lymphs Abs: 1 10*3/uL (ref 0.9–3.3)
MCH: 32.8 pg (ref 25.1–34.0)
MCHC: 34.8 g/dL (ref 31.5–36.0)
MCV: 94.2 fL (ref 79.5–101.0)
MONO ABS: 0.4 10*3/uL (ref 0.1–0.9)
Monocytes Relative: 8 %
NEUTROS ABS: 3.5 10*3/uL (ref 1.5–6.5)
Neutrophils Relative %: 70 %
PLATELETS: 175 10*3/uL (ref 145–400)
RBC: 4.19 MIL/uL (ref 3.70–5.45)
RDW: 13.6 % (ref 11.2–14.5)
WBC Count: 5 10*3/uL (ref 3.9–10.3)

## 2017-10-27 LAB — CMP (CANCER CENTER ONLY)
ALK PHOS: 110 U/L (ref 40–150)
ALT: 32 U/L (ref 0–55)
ANION GAP: 7 (ref 3–11)
AST: 36 U/L — ABNORMAL HIGH (ref 5–34)
Albumin: 3.9 g/dL (ref 3.5–5.0)
BILIRUBIN TOTAL: 1.1 mg/dL (ref 0.2–1.2)
BUN: 11 mg/dL (ref 7–26)
CALCIUM: 10 mg/dL (ref 8.4–10.4)
CO2: 27 mmol/L (ref 22–29)
Chloride: 106 mmol/L (ref 98–109)
Creatinine: 0.76 mg/dL (ref 0.60–1.10)
Glucose, Bld: 156 mg/dL — ABNORMAL HIGH (ref 70–140)
Potassium: 4.1 mmol/L (ref 3.5–5.1)
Sodium: 140 mmol/L (ref 136–145)
TOTAL PROTEIN: 7.2 g/dL (ref 6.4–8.3)

## 2017-10-27 LAB — TSH: TSH: 2.989 u[IU]/mL (ref 0.308–3.960)

## 2017-10-27 MED ORDER — SODIUM CHLORIDE 0.9 % IV SOLN
Freq: Once | INTRAVENOUS | Status: AC
  Administered 2017-10-27: 12:00:00 via INTRAVENOUS

## 2017-10-27 MED ORDER — SODIUM CHLORIDE 0.9% FLUSH
10.0000 mL | INTRAVENOUS | Status: DC | PRN
  Administered 2017-10-27: 10 mL
  Filled 2017-10-27: qty 10

## 2017-10-27 MED ORDER — HEPARIN SOD (PORK) LOCK FLUSH 100 UNIT/ML IV SOLN
500.0000 [IU] | Freq: Once | INTRAVENOUS | Status: AC | PRN
  Administered 2017-10-27: 500 [IU]
  Filled 2017-10-27: qty 5

## 2017-10-27 MED ORDER — AZITHROMYCIN 250 MG PO TABS: ORAL_TABLET | ORAL | 0 refills | Status: DC

## 2017-10-27 MED ORDER — SODIUM CHLORIDE 0.9% FLUSH
10.0000 mL | INTRAVENOUS | Status: DC | PRN
  Administered 2017-10-27: 10 mL via INTRAVENOUS
  Filled 2017-10-27: qty 10

## 2017-10-27 MED ORDER — SODIUM CHLORIDE 0.9 % IV SOLN
480.0000 mg | Freq: Once | INTRAVENOUS | Status: AC
  Administered 2017-10-27: 480 mg via INTRAVENOUS
  Filled 2017-10-27: qty 48

## 2017-10-27 NOTE — Progress Notes (Signed)
[place] Telephone:([phone]) [phone]   [phone]) [phone]  OFFICE PROGRESS NOTE  Harris, [name], [name] [address] [place] [address]  DIAGNOSIS: Metastatic non-small cell lung cancer, adenocarcinoma. This was initially diagnosed as stage IB (T2a, N0, M0) in March of 2014, status post right upper lobectomy with lymph node dissection but the patient has evidence for disease recurrence in the right axilla status post resection.  PRIOR THERAPY:  1) Status post right axillary lymph node biopsy on 05/01/2014 under the care of Dr. Roxan Hockey. 2) Systemic chemotherapy with carboplatin for AUC of 5 and Alimta 500 mg/M2 every 3 weeks. First cycle 05/31/2014. Status post 6 cycles. Last dose was given 09/22/2014. 3) palliative radiotherapy to the subcarinal lymph node under the care of Dr. Sondra Come. 4) Nivolumab 240 mg IV every 2 weeks. First dose 12/23/2016. Status post 6 cycles.  CURRENT THERAPY:  Nivolumab 480 mg IV every 4 weeks. First dose 03/17/2017. Status post 8 cycles.  INTERVAL HISTORY: Nancy Hodges 72 y.o. female returns to the clinic today for follow-up visit.  The patient is feeling fine today with no specific complaints except for mild fatigue and postnasal drainage with greenish mucus.  She denied having any recent fever or chills.  She has no nausea, vomiting, diarrhea or constipation.  She mentioned that the battery of her pacemaker is dead and she is seeing her cardiologist for reevaluation.  The patient denied having any significant weight loss or night sweats.  She is here for evaluation before starting cycle #9 of her treatment.   MEDICAL HISTORY: Past Medical History:  Diagnosis Date  . Allergic rhinitis   . Anxiety   . Arthritis   . Asthma   . Atrial flutter ([place])   . Cholelithiases 09/26/2015  . Diverticulosis   . Encounter for antineoplastic immunotherapy 12/09/2016  . GERD (gastroesophageal reflux disease)   . H/O hiatal hernia   .  History of blood transfusion   . History of chemotherapy   . History of kidney stones   . History of radiation therapy   . Internal hemorrhoid   . Lung cancer ([place])    a. Stage IB non-small cell carcinoma, s/p R VATS, wedge resection, RU lobectomy 10/2012.  . Paroxysmal atrial fibrillation ([facility])    a. anticoagulated with Xarelto  . Pneumonia 06/2016   [facility]  . Presence of permanent cardiac pacemaker   . Pulmonary nodule   . Tachycardia-bradycardia syndrome ([place])    a. 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    ALLERGIES:  is allergic to chlorpheniramine-dm; prednisone; septra [sulfamethoxazole-trimethoprim]; penicillins; and vioxx [rofecoxib].  MEDICATIONS:  Current Outpatient Medications  Medication Sig Dispense Refill  . acetaminophen (TYLENOL) 500 MG tablet Take 500 mg by mouth every 6 (six) hours as needed for moderate pain or fever.     [name] albuterol (PROVENTIL HFA;VENTOLIN HFA) 108 (90 BASE) MCG/ACT inhaler Inhale 1-2 puffs into the lungs every 6 (six) hours as needed for wheezing or shortness of breath.    . diltiazem (CARDIZEM CD) 240 MG 24 hr capsule Take 1 capsule (240 mg total) by mouth daily. 90 capsule 3  . hydroxypropyl methylcellulose / hypromellose (ISOPTO TEARS / GONIOVISC) 2.5 % ophthalmic solution Place 1-2 drops into the left eye as needed for dry eyes.    [name] ipratropium-albuterol (DUONEB) 0.5-2.5 (3) MG/3ML SOLN Take 3 mLs by nebulization every 6 (six) hours as needed (shortness of breath or wheezing).     [name] lidocaine-prilocaine (  EMLA) cream Apply 1 application topically as needed. (Patient taking differently: Apply 1 application topically as needed (for port). ) 30 g 4  . loratadine (CLARITIN) 10 MG tablet Take 10 mg by mouth daily.    . Multiple Vitamin (MULTIVITAMIN) tablet Take 1 tablet by mouth daily.    . ondansetron (ZOFRAN ODT) 4 MG disintegrating tablet Take 1 tablet (4 mg total) by mouth every 8 (eight) hours as needed for nausea or vomiting. 6  tablet 0  . rivaroxaban (XARELTO) 20 MG TABS tablet Take 1 tablet (20 mg total) by mouth daily with supper. 30 tablet 11   No current facility-administered medications for this visit.    Facility-Administered Medications Ordered in Other Visits  Medication Dose Route Frequency Provider Last Rate Last Dose  . sodium chloride flush (NS) 0.9 % injection 10 mL  10 mL Intravenous PRN Curt Bears, MD   10 mL at 10/27/17 1030    SURGICAL HISTORY:  Past Surgical History:  Procedure Laterality Date  . CATARACT EXTRACTION W/PHACO Right 08/14/2016   Procedure: CATARACT EXTRACTION PHACO AND INTRAOCULAR LENS PLACEMENT RIGHT EYE CDE 10.53;  Surgeon: Tonny Branch, MD;  [facility];  Service: Ophthalmology;  Laterality: Right;  right  . CATARACT EXTRACTION W/PHACO Left 09/25/2016   Procedure: CATARACT EXTRACTION PHACO AND INTRAOCULAR LENS PLACEMENT (IOC);  Surgeon: Tonny Branch, MD;  [facility];  Service: Ophthalmology;  Laterality: Left;  left CDE 8.81  . COLONOSCOPY     [facility]: cattered sigmoid diverticula, internal hemorrhoids, sessile polyp in ascending and mid transverse colon (tubular adenoma).   . colonoscopy with polypectomy  2015  . ESOPHAGOGASTRODUODENOSCOPY     [facility]: bile reflux in stomach, mild gastritis, hiatal hernia  . ESOPHAGOGASTRODUODENOSCOPY N/A 01/31/2016   Procedure: ESOPHAGOGASTRODUODENOSCOPY (EGD);  Surgeon: [name], MD;  [facility];  Service: Endoscopy;  Laterality: N/A;  1200  . IR FLUORO GUIDE PORT INSERTION RIGHT  06/30/2017  . IR US GUIDE VASC ACCESS RIGHT  06/30/2017  . LOBECTOMY Right 10/27/2012   Procedure: LOBECTOMY;  Surgeon: [name], MD;  Location: [place];  Service: Thoracic;  Laterality: Right;   RIGHT UPPER LOBECTOMY & Node Dissection  . LYMPH NODE BIOPSY Right 05/01/2014   Procedure: LYMPH NODE BIOPSY, Right Axillary;  Surgeon: [name], MD;  Location: [place];  Service: Thoracic;  Laterality: Right;  . PARTIAL  HYSTERECTOMY    . PERMANENT PACEMAKER INSERTION N/A 09/14/2012   Nanostim (SJM) leadless pacemaker (LEADLESS II STUDY PATIENT) implanted by Dr [name]  . VIDEO ASSISTED THORACOSCOPY (VATS)/WEDGE RESECTION Right 10/27/2012   Procedure: VIDEO ASSISTED THORACOSCOPY (VATS),RGHT UPPER LOBE LUNG WEDGE RESECTION;  Surgeon: [name], MD;  Location: [place];  Service: Thoracic;  Laterality: Right;    REVIEW OF SYSTEMS:  A comprehensive review of systems was negative except for: Constitutional: positive for fatigue Ears, nose, mouth, throat, and face: positive for Postnasal drainage   PHYSICAL EXAMINATION: General appearance: alert, cooperative and no distress Head: Normocephalic, without obvious abnormality, atraumatic Neck: no adenopathy, no JVD, supple, symmetrical, trachea midline and thyroid not enlarged, symmetric, no tenderness/mass/nodules Lymph nodes: Cervical, supraclavicular, and axillary nodes normal. Resp: clear to auscultation bilaterally Back: symmetric, no curvature. ROM normal. No CVA tenderness. Cardio: regular rate and rhythm, S1, S2 normal, no murmur, click, rub or gallop GI: soft, non-tender; bowel sounds normal; no masses,  no organomegaly Extremities: extremities normal, atraumatic, no cyanosis or edema  ECOG PERFORMANCE STATUS: 1 - Symptomatic but completely ambulatory  Blood pressure (!) 138/47, pulse 72, temperature 97.8 F (36.6 C), temperature source Oral, resp. rate 20, height 5\' 2"  (1.575 m), weight 152 lb 9.6 oz (69.2 kg), SpO2 98 %.  LABORATORY DATA: Lab Results  Component Value Date   WBC 4.3 09/29/2017   HGB 14.6 09/13/2017   HCT 39.6 09/29/2017   MCV 95.9 09/29/2017   PLT 168 09/29/2017      Chemistry      Component Value Date/Time   NA 140 09/29/2017 0944   NA 139 08/05/2017 0902   K 4.0 09/29/2017 0944   K 4.1 08/05/2017 0902   CL 106 09/29/2017 0944   CO2 26 09/29/2017 0944   CO2 28 08/05/2017 0902   BUN 12 09/29/2017 0944   BUN 10.1  08/05/2017 0902   CREATININE 0.80 09/29/2017 0944   CREATININE 0.8 08/05/2017 0902   GLU 134 (H) 02/18/2017 1410      Component Value Date/Time   CALCIUM 10.0 09/29/2017 0944   CALCIUM 9.5 08/05/2017 0902   ALKPHOS 112 09/29/2017 0944   ALKPHOS 111 08/05/2017 0902   AST 32 09/29/2017 0944   AST 33 08/05/2017 0902   ALT 36 09/29/2017 0944   ALT 34 08/05/2017 0902   BILITOT 0.9 09/29/2017 0944   BILITOT 1.39 (H) 08/05/2017 0902       RADIOGRAPHIC STUDIES: No results found. ASSESSMENT AND PLAN:  This is a very pleasant 72 years old white female with metastatic non-small cell lung cancer, adenocarcinoma with no actionable mutations status post systemic chemotherapy with carboplatin and Alimta and she is currently undergoing treatment with second line immunotherapy with Nivolumab status post 6 cycles. This was followed by immunotherapy with Nivolumab 480 mg IV every 4 weeks status post 8 cycles. The patient continues to tolerate her treatment with Nivolumab fairly well.  I recommended for her to proceed with cycle #9 today as a scheduled. She will come back for follow-up visit in 4 weeks for evaluation after repeating CT scan of the chest, abdomen and pelvis for restaging of her disease. For the postnasal drainage, I will start the patient on Z-Pak. She was advised to call immediately if she has any concerning symptoms in the interval. The patient voices understanding of current disease status and treatment options and is in agreement with the current care plan. All questions were answered. The patient knows to call the clinic with any problems, questions or concerns. We can certainly see the patient much sooner if necessary.  Disclaimer: This note was dictated with voice recognition software. Similar sounding words can inadvertently be transcribed and may not be corrected upon review.

## 2017-10-27 NOTE — Telephone Encounter (Signed)
Scheduled appt per 3/26 los - Gave patient AVS and calender per los.

## 2017-10-27 NOTE — Patient Instructions (Signed)
[facility] Discharge Instructions for Patients Receiving Chemotherapy  Today you received the following chemotherapy agents: Nivolumab  To help prevent nausea and vomiting after your treatment, we encourage you to take your nausea medication as directed.    If you develop nausea and vomiting that is not controlled by your nausea medication, call the clinic.   BELOW ARE SYMPTOMS THAT SHOULD BE REPORTED IMMEDIATELY:  *FEVER GREATER THAN 100.5 F  *CHILLS WITH OR WITHOUT FEVER  NAUSEA AND VOMITING THAT IS NOT CONTROLLED WITH YOUR NAUSEA MEDICATION  *UNUSUAL SHORTNESS OF BREATH  *UNUSUAL BRUISING OR BLEEDING  TENDERNESS IN MOUTH AND THROAT WITH OR WITHOUT PRESENCE OF ULCERS  *URINARY PROBLEMS  *BOWEL PROBLEMS  UNUSUAL RASH Items with * indicate a potential emergency and should be followed up as soon as possible.  Feel free to call the clinic should you have any questions or concerns. The clinic phone number is [phone].  Please show the CHEMO ALERT CARD at check-in to the Emergency Department and triage nurse.   

## 2017-10-28 ENCOUNTER — Other Ambulatory Visit: Payer: Self-pay

## 2017-10-28 ENCOUNTER — Ambulatory Visit (HOSPITAL_COMMUNITY): Payer: Medicare Other | Attending: Cardiology

## 2017-10-28 DIAGNOSIS — J189 Pneumonia, unspecified organism: Secondary | ICD-10-CM | POA: Diagnosis not present

## 2017-10-28 DIAGNOSIS — R079 Chest pain, unspecified: Secondary | ICD-10-CM | POA: Diagnosis present

## 2017-10-28 DIAGNOSIS — I4892 Unspecified atrial flutter: Secondary | ICD-10-CM | POA: Insufficient documentation

## 2017-10-28 DIAGNOSIS — I480 Paroxysmal atrial fibrillation: Secondary | ICD-10-CM | POA: Insufficient documentation

## 2017-10-28 DIAGNOSIS — J45909 Unspecified asthma, uncomplicated: Secondary | ICD-10-CM | POA: Diagnosis not present

## 2017-10-28 DIAGNOSIS — I495 Sick sinus syndrome: Secondary | ICD-10-CM | POA: Insufficient documentation

## 2017-10-30 ENCOUNTER — Ambulatory Visit (HOSPITAL_COMMUNITY)
Admission: RE | Admit: 2017-10-30 | Discharge: 2017-10-31 | Disposition: A | Discharge status: Home / Self Care | Payer: Medicare Other | Source: Ambulatory Visit | Attending: Internal Medicine | Admitting: Internal Medicine

## 2017-10-30 ENCOUNTER — Encounter (HOSPITAL_COMMUNITY)
Admission: RE | Disposition: A | Discharge status: Home / Self Care | Payer: Self-pay | Source: Ambulatory Visit | Attending: Internal Medicine

## 2017-10-30 ENCOUNTER — Other Ambulatory Visit: Payer: Self-pay

## 2017-10-30 ENCOUNTER — Encounter (HOSPITAL_COMMUNITY): Payer: Self-pay

## 2017-10-30 DIAGNOSIS — Z882 Allergy status to sulfonamides status: Secondary | ICD-10-CM | POA: Diagnosis not present

## 2017-10-30 DIAGNOSIS — Z923 Personal history of irradiation: Secondary | ICD-10-CM | POA: Insufficient documentation

## 2017-10-30 DIAGNOSIS — I495 Sick sinus syndrome: Secondary | ICD-10-CM | POA: Diagnosis present

## 2017-10-30 DIAGNOSIS — Z9221 Personal history of antineoplastic chemotherapy: Secondary | ICD-10-CM | POA: Diagnosis not present

## 2017-10-30 DIAGNOSIS — Z888 Allergy status to other drugs, medicaments and biological substances status: Secondary | ICD-10-CM | POA: Insufficient documentation

## 2017-10-30 DIAGNOSIS — T82111A Breakdown (mechanical) of cardiac pulse generator (battery), initial encounter: Secondary | ICD-10-CM | POA: Insufficient documentation

## 2017-10-30 DIAGNOSIS — Z959 Presence of cardiac and vascular implant and graft, unspecified: Secondary | ICD-10-CM

## 2017-10-30 DIAGNOSIS — J45909 Unspecified asthma, uncomplicated: Secondary | ICD-10-CM | POA: Insufficient documentation

## 2017-10-30 DIAGNOSIS — Y713 Surgical instruments, materials and cardiovascular devices (including sutures) associated with adverse incidents: Secondary | ICD-10-CM | POA: Diagnosis not present

## 2017-10-30 DIAGNOSIS — I4892 Unspecified atrial flutter: Secondary | ICD-10-CM | POA: Diagnosis not present

## 2017-10-30 DIAGNOSIS — K449 Diaphragmatic hernia without obstruction or gangrene: Secondary | ICD-10-CM | POA: Diagnosis not present

## 2017-10-30 DIAGNOSIS — Z85118 Personal history of other malignant neoplasm of bronchus and lung: Secondary | ICD-10-CM | POA: Diagnosis not present

## 2017-10-30 DIAGNOSIS — I480 Paroxysmal atrial fibrillation: Secondary | ICD-10-CM | POA: Insufficient documentation

## 2017-10-30 DIAGNOSIS — C3490 Malignant neoplasm of unspecified part of unspecified bronchus or lung: Secondary | ICD-10-CM | POA: Diagnosis not present

## 2017-10-30 DIAGNOSIS — Z87442 Personal history of urinary calculi: Secondary | ICD-10-CM | POA: Insufficient documentation

## 2017-10-30 DIAGNOSIS — Z7901 Long term (current) use of anticoagulants: Secondary | ICD-10-CM | POA: Diagnosis not present

## 2017-10-30 DIAGNOSIS — K219 Gastro-esophageal reflux disease without esophagitis: Secondary | ICD-10-CM | POA: Insufficient documentation

## 2017-10-30 DIAGNOSIS — Z79899 Other long term (current) drug therapy: Secondary | ICD-10-CM | POA: Insufficient documentation

## 2017-10-30 DIAGNOSIS — Z880 Allergy status to penicillin: Secondary | ICD-10-CM | POA: Insufficient documentation

## 2017-10-30 DIAGNOSIS — Z87892 Personal history of anaphylaxis: Secondary | ICD-10-CM | POA: Diagnosis not present

## 2017-10-30 HISTORY — PX: PACEMAKER IMPLANT: EP1218

## 2017-10-30 LAB — SURGICAL PCR SCREEN
MRSA, PCR: NEGATIVE
STAPHYLOCOCCUS AUREUS: NEGATIVE

## 2017-10-30 SURGERY — PACEMAKER IMPLANT

## 2017-10-30 MED ORDER — LIDOCAINE HCL (PF) 1 % IJ SOLN
INTRAMUSCULAR | Status: AC
  Filled 2017-10-30: qty 30

## 2017-10-30 MED ORDER — AZITHROMYCIN 250 MG PO TABS
250.0000 mg | ORAL_TABLET | Freq: Every day | ORAL | Status: AC
  Administered 2017-10-31: 250 mg via ORAL
  Filled 2017-10-30: qty 1

## 2017-10-30 MED ORDER — SODIUM CHLORIDE 0.9 % IV SOLN
INTRAVENOUS | Status: DC
  Administered 2017-10-30: 13:00:00 via INTRAVENOUS

## 2017-10-30 MED ORDER — FENTANYL CITRATE (PF) 100 MCG/2ML IJ SOLN
INTRAMUSCULAR | Status: DC | PRN
  Administered 2017-10-30: 12.5 ug via INTRAVENOUS
  Administered 2017-10-30: 25 ug via INTRAVENOUS
  Administered 2017-10-30: 12.5 ug via INTRAVENOUS

## 2017-10-30 MED ORDER — FENTANYL CITRATE (PF) 100 MCG/2ML IJ SOLN
INTRAMUSCULAR | Status: AC
  Filled 2017-10-30: qty 2

## 2017-10-30 MED ORDER — SODIUM CHLORIDE 0.9 % IR SOLN
80.0000 mg | Status: AC
  Administered 2017-10-30: 80 mg
  Filled 2017-10-30: qty 2

## 2017-10-30 MED ORDER — IOPAMIDOL (ISOVUE-370) INJECTION 76%
INTRAVENOUS | Status: DC | PRN
  Administered 2017-10-30: 15 mL via INTRAVENOUS

## 2017-10-30 MED ORDER — CHLORHEXIDINE GLUCONATE 4 % EX LIQD: 60.0000 mL | Freq: Once | CUTANEOUS | Status: DC

## 2017-10-30 MED ORDER — VANCOMYCIN HCL IN DEXTROSE 1-5 GM/200ML-% IV SOLN
INTRAVENOUS | Status: AC
  Filled 2017-10-30: qty 200

## 2017-10-30 MED ORDER — MIDAZOLAM HCL 5 MG/5ML IJ SOLN
INTRAMUSCULAR | Status: DC | PRN
  Administered 2017-10-30 (×2): 2 mg via INTRAVENOUS
  Administered 2017-10-30: 1 mg via INTRAVENOUS

## 2017-10-30 MED ORDER — SODIUM CHLORIDE 0.9 % IR SOLN
Status: AC
  Filled 2017-10-30: qty 2

## 2017-10-30 MED ORDER — VANCOMYCIN HCL IN DEXTROSE 1-5 GM/200ML-% IV SOLN
1000.0000 mg | INTRAVENOUS | Status: AC
  Administered 2017-10-30: 1000 mg via INTRAVENOUS

## 2017-10-30 MED ORDER — SODIUM CHLORIDE 0.9% FLUSH: 3.0000 mL | INTRAVENOUS | Status: DC | PRN

## 2017-10-30 MED ORDER — DILTIAZEM HCL ER COATED BEADS 240 MG PO CP24
240.0000 mg | ORAL_CAPSULE | Freq: Every day | ORAL | Status: DC
  Administered 2017-10-31: 240 mg via ORAL
  Filled 2017-10-30: qty 1

## 2017-10-30 MED ORDER — LIDOCAINE HCL (PF) 1 % IJ SOLN
INTRAMUSCULAR | Status: DC | PRN
  Administered 2017-10-30: 60 mL
  Administered 2017-10-30: 30 mL

## 2017-10-30 MED ORDER — HYDROCODONE-ACETAMINOPHEN 5-325 MG PO TABS
1.0000 | ORAL_TABLET | ORAL | Status: DC | PRN
  Filled 2017-10-30: qty 1

## 2017-10-30 MED ORDER — MUPIROCIN 2 % EX OINT: 1.0000 "application " | TOPICAL_OINTMENT | Freq: Once | CUTANEOUS | Status: DC

## 2017-10-30 MED ORDER — HEPARIN (PORCINE) IN NACL 2-0.9 UNIT/ML-% IJ SOLN
INTRAMUSCULAR | Status: AC
  Filled 2017-10-30: qty 500

## 2017-10-30 MED ORDER — SODIUM CHLORIDE 0.9% FLUSH: 3.0000 mL | Freq: Two times a day (BID) | INTRAVENOUS | Status: DC

## 2017-10-30 MED ORDER — ONDANSETRON HCL 4 MG/2ML IJ SOLN: 4.0000 mg | Freq: Four times a day (QID) | INTRAMUSCULAR | Status: DC | PRN

## 2017-10-30 MED ORDER — SODIUM CHLORIDE 0.9 % IV SOLN: 250.0000 mL | INTRAVENOUS | Status: DC | PRN

## 2017-10-30 MED ORDER — MIDAZOLAM HCL 5 MG/5ML IJ SOLN
INTRAMUSCULAR | Status: AC
  Filled 2017-10-30: qty 5

## 2017-10-30 MED ORDER — VANCOMYCIN HCL IN DEXTROSE 1-5 GM/200ML-% IV SOLN
1000.0000 mg | Freq: Two times a day (BID) | INTRAVENOUS | Status: AC
  Administered 2017-10-31: 01:00:00 1000 mg via INTRAVENOUS
  Filled 2017-10-30: qty 200

## 2017-10-30 MED ORDER — ACETAMINOPHEN 325 MG PO TABS
325.0000 mg | ORAL_TABLET | ORAL | Status: DC | PRN
  Administered 2017-10-30 – 2017-10-31 (×2): 650 mg via ORAL
  Filled 2017-10-30 (×2): qty 2

## 2017-10-30 MED ORDER — MUPIROCIN 2 % EX OINT
TOPICAL_OINTMENT | CUTANEOUS | Status: AC
  Administered 2017-10-30: 14:00:00
  Filled 2017-10-30: qty 22

## 2017-10-30 SURGICAL SUPPLY — 7 items
CABLE SURGICAL S-101-97-12 (CABLE) ×3 IMPLANT
LEAD TENDRIL MRI 46CM LPA1200M (Lead) ×3 IMPLANT
LEAD TENDRIL MRI 58CM LPA1200M (Lead) ×3 IMPLANT
PACEMAKER ASSURITY DR-RF (Pacemaker) ×3 IMPLANT
PAD DEFIB LIFELINK (PAD) ×3 IMPLANT
SHEATH CLASSIC 8F (SHEATH) ×6 IMPLANT
TRAY PACEMAKER INSERTION (PACKS) ×3 IMPLANT

## 2017-10-30 NOTE — Discharge Summary (Signed)
ELECTROPHYSIOLOGY PROCEDURE DISCHARGE SUMMARY    Patient ID: Nancy Hodges,  MRN: [id number], DOB/AGE: 1945/09/08 72 y.o.  Admit date: 10/30/2017 Discharge date: 10/31/2017  Primary Care Physician: [name], [name] Electrophysiologist: Estalene Bergey  Primary Discharge Diagnosis:  Tachy/brady syndrome status post pacemaker implantation this admission  Secondary Discharge Diagnosis:  1.  Paroxysmal atrial fibrillation 2.  Lung cancer 3.  GERD  Allergies  Allergen Reactions  . Chlorpheniramine-Dm Anaphylaxis and Other (See Comments)    Pt states BP dropped; developed irregular heartbeat   . Prednisone Shortness Of Breath, Palpitations and Other (See Comments)    Ddouble vision. History is not consistent with any allergy.  Sarina Ill [Sulfamethoxazole-Trimethoprim] Shortness Of Breath  . Penicillins Swelling, Rash and Other (See Comments)    Has patient had a PCN reaction causing immediate rash, facial/tongue/throat swelling, SOB or lightheadedness with hypotension: Yes Has patient had a PCN reaction causing severe rash involving mucus membranes or skin necrosis: No Has patient had a PCN reaction that required hospitalization No Has patient had a PCN reaction occurring within the last 10 years: No If all of the above answers are "NO", then may proceed with Cephalosporin use.   . Vioxx [Rofecoxib] Other (See Comments)    Stomach cramps     Procedures This Admission:  1.  Implantation of a STJ dual chamber PPM on 10/30/17 by Dr [name]. See op note for full details.  There were no immediate post procedure complications. 2.  CXR on 10/31/17 demonstrated no pneumothorax status post device implantation.   Brief HPI: Nancy Hodges is a 71 y.o. female with the above past medical history. She underwent STJ Nanostim leadless pacemaker implant in 2014 and did well until her device had premature battery depletion. Risks, benefits, and alternatives to PPM implantation were  reviewed with the patient who wished to proceed.   Hospital Course:  The patient was admitted and underwent implantation of a STJ dual chamber PPM with details as outlined above.  She  was monitored on telemetry overnight which demonstrated afib.  Left chest was without hematoma or ecchymosis.  The device was interrogated and found to be functioning normally.  CXR was obtained and demonstrated no pneumothorax status post device implantation.  Wound care, arm mobility, and restrictions were reviewed with the patient.  The patient was examined and considered stable for discharge to home.    Physical Exam: Vitals:   10/30/17 1900 10/30/17 2032 10/31/17 0314 10/31/17 0712  BP: (!) 130/28 (!) 123/41 (!) 115/52 (!) 84/51  Pulse: 86 83 80 76  Resp: (!) 22 15 (!) 25 17  Temp: 97.8 F (36.6 C)  98.1 F (36.7 C) 98.1 F (36.7 C)  TempSrc: Oral  Oral Oral  SpO2: 95% 97% 94% 94%  Weight:   71.9 kg (158 lb 8.2 oz)   Height:        GEN- The patient is elderly appearing, alert and oriented x 3 today.   HEENT: normocephalic, atraumatic; sclera clear, conjunctiva pink; hearing intact; oropharynx clear; neck supple  Lungs- Clear to ausculation bilaterally, normal work of breathing.  No wheezes, rales, rhonchi Heart- irregular rate and rhythm  GI- soft, non-tender, non-distended, bowel sounds present  Extremities- no clubbing, cyanosis, or edema; DP/PT/radial pulses 2+ bilaterally MS- no significant deformity or atrophy Skin- warm and dry, no rash or lesion, left chest without hematoma/ecchymosis Psych- euthymic mood, full affect Neuro- strength and sensation are intact   Labs:   Lab Results  Component  Value Date   WBC 5.0 10/27/2017   HGB 14.6 09/13/2017   HCT 39.5 10/27/2017   MCV 94.2 10/27/2017   PLT 175 10/27/2017    Recent Labs  Lab 10/27/17 1017 10/31/17 0248  NA 140 138  K 4.1 3.9  CL 106 105  CO2 27 23  BUN 11 14  CREATININE 0.76 0.78  CALCIUM 10.0 8.6*  PROT 7.2  --     BILITOT 1.1  --   ALKPHOS 110  --   ALT 32  --   AST 36*  --   GLUCOSE 156* 98    Discharge Medications:  Allergies as of 10/31/2017      Reactions   Chlorpheniramine-dm Anaphylaxis, Other (See Comments)   Pt states BP dropped; developed irregular heartbeat    Prednisone Shortness Of Breath, Palpitations, Other (See Comments)   Ddouble vision. History is not consistent with any allergy.   Septra [sulfamethoxazole-trimethoprim] Shortness Of Breath   Penicillins Swelling, Rash, Other (See Comments)   Has patient had a PCN reaction causing immediate rash, facial/tongue/throat swelling, SOB or lightheadedness with hypotension: Yes Has patient had a PCN reaction causing severe rash involving mucus membranes or skin necrosis: No Has patient had a PCN reaction that required hospitalization No Has patient had a PCN reaction occurring within the last 10 years: No If all of the above answers are "NO", then may proceed with Cephalosporin use.   Vioxx [rofecoxib] Other (See Comments)   Stomach cramps      Medication List    TAKE these medications   acetaminophen 500 MG tablet Commonly known as:  TYLENOL Take 500 mg by mouth every 6 (six) hours as needed for moderate pain or fever.   albuterol 108 (90 Base) MCG/ACT inhaler Commonly known as:  PROVENTIL HFA;VENTOLIN HFA Inhale 1-2 puffs into the lungs every 6 (six) hours as needed for wheezing or shortness of breath.   azithromycin 250 MG tablet Commonly known as:  ZITHROMAX Use as instructed   diltiazem 240 MG 24 hr capsule Commonly known as:  CARDIZEM CD Take 1 capsule (240 mg total) by mouth daily.   hydroxypropyl methylcellulose / hypromellose 2.5 % ophthalmic solution Commonly known as:  ISOPTO TEARS / GONIOVISC Place 1-2 drops into the left eye as needed for dry eyes.   ipratropium-albuterol 0.5-2.5 (3) MG/3ML Soln Commonly known as:  DUONEB Take 3 mLs by nebulization every 6 (six) hours as needed (shortness of breath or  wheezing).   lidocaine-prilocaine cream Commonly known as:  EMLA Apply 1 application topically as needed. What changed:  reasons to take this   loratadine 10 MG tablet Commonly known as:  CLARITIN Take 10 mg by mouth daily.   multivitamin tablet Take 1 tablet by mouth daily.   ondansetron 4 MG disintegrating tablet Commonly known as:  ZOFRAN ODT Take 1 tablet (4 mg total) by mouth every 8 (eight) hours as needed for nausea or vomiting.   rivaroxaban 20 MG Tabs tablet Commonly known as:  XARELTO Take 1 tablet (20 mg total) by mouth daily with supper.       Disposition:   Follow-up Information    [facility] Follow up on 11/12/2017.   Specialty:  Cardiology Why:  at [facility] information: [address], [place]       [name], MD Follow up on 02/01/2018.   Specialty:  Cardiology Why:  at 10:45AM Contact information: [place]  [address] [address] [phone]           Duration of Discharge Encounter: Greater than 30 minutes including physician time.  Army Fossa MD 10/31/2017 10:29 AM

## 2017-10-30 NOTE — Interval H&P Note (Signed)
History and Physical Interval Note:  10/30/2017 2:29 PM  Nancy Hodges  has presented today for surgery, with the diagnosis of eri  The various methods of treatment have been discussed with the patient and family. After consideration of risks, benefits and other options for treatment, the patient has consented to  Procedure(s): PACEMAKER IMPLANT (N/A) as a surgical intervention .  The patient's history has been reviewed, patient examined, no change in status, stable for surgery.  I have reviewed the patient's chart and labs.  Questions were answered to the patient's satisfaction.     [name]

## 2017-10-30 NOTE — Progress Notes (Signed)
1930 Bedside shift report. Pt resting in bed comfortably, denies pain, left arm in sling. Incision CDI. Fall precautions in place, [facility].   2000 Pt assessed, see flow sheet. Pt assisted up to bathroom. Tolerated well.

## 2017-10-31 ENCOUNTER — Ambulatory Visit (HOSPITAL_COMMUNITY): Payer: Medicare Other

## 2017-10-31 DIAGNOSIS — I495 Sick sinus syndrome: Secondary | ICD-10-CM | POA: Diagnosis not present

## 2017-10-31 DIAGNOSIS — I480 Paroxysmal atrial fibrillation: Secondary | ICD-10-CM | POA: Diagnosis not present

## 2017-10-31 DIAGNOSIS — T82111A Breakdown (mechanical) of cardiac pulse generator (battery), initial encounter: Secondary | ICD-10-CM | POA: Diagnosis not present

## 2017-10-31 DIAGNOSIS — I4892 Unspecified atrial flutter: Secondary | ICD-10-CM | POA: Diagnosis not present

## 2017-10-31 LAB — BASIC METABOLIC PANEL
ANION GAP: 10 (ref 5–15)
BUN: 14 mg/dL (ref 6–20)
CO2: 23 mmol/L (ref 22–32)
Calcium: 8.6 mg/dL — ABNORMAL LOW (ref 8.9–10.3)
Chloride: 105 mmol/L (ref 101–111)
Creatinine, Ser: 0.78 mg/dL (ref 0.44–1.00)
GFR calc Af Amer: >60 mL/min (ref >60)
GFR calc non Af Amer: >60 mL/min (ref >60)
GLUCOSE: 98 mg/dL (ref 65–99)
Potassium: 3.9 mmol/L (ref 3.5–5.1)
Sodium: 138 mmol/L (ref 135–145)

## 2017-10-31 MED ORDER — YOU HAVE A PACEMAKER BOOK
Freq: Once | Status: AC
  Administered 2017-10-31: 01:00:00
  Filled 2017-10-31: qty 1

## 2017-10-31 NOTE — Discharge Instructions (Signed)
° ° °  Supplemental Discharge Instructions for  Pacemaker Patients  Activity No heavy lifting or vigorous activity with your left/right arm for 6 to 8 weeks.  Do not raise your left/right arm above your head for one week.  Gradually raise your affected arm as drawn below.           __        11/03/17                       11/04/17                       11/05/17                      11/06/17  NO DRIVING for   1 week  ; you may begin driving on  0/0/37   .  WOUND CARE - Keep the wound area clean and dry.  Do not get this area wet for one week. No showers for one week; you may shower on   11/06/17  . - The tape/steri-strips on your wound will fall off; do not pull them off.  No bandage is needed on the site.  DO  NOT apply any creams, oils, or ointments to the wound area. - If you notice any drainage or discharge from the wound, any swelling or bruising at the site, or you develop a fever > 101? F after you are discharged home, call the office at once.  Special Instructions - You are still able to use cellular telephones; use the ear opposite the side where you have your pacemaker/defibrillator.  Avoid carrying your cellular phone near your device. - When traveling through airports, show security personnel your identification card to avoid being screened in the metal detectors.  Ask the security personnel to use the hand wand. - Avoid arc welding equipment, MRI testing (magnetic resonance imaging), TENS units (transcutaneous nerve stimulators).  Call the office for questions about other devices. - Avoid electrical appliances that are in poor condition or are not properly grounded. - Microwave ovens are safe to be near or to operate.

## 2017-11-02 ENCOUNTER — Encounter (HOSPITAL_COMMUNITY): Payer: Self-pay | Admitting: Internal Medicine

## 2017-11-02 MED FILL — Heparin Sodium (Porcine) 2 Unit/ML in Sodium Chloride 0.9%: INTRAMUSCULAR | Qty: 500 | Status: AC

## 2017-11-12 ENCOUNTER — Ambulatory Visit (INDEPENDENT_AMBULATORY_CARE_PROVIDER_SITE_OTHER): Payer: Medicare Other | Admitting: *Deleted

## 2017-11-12 DIAGNOSIS — I480 Paroxysmal atrial fibrillation: Secondary | ICD-10-CM | POA: Diagnosis not present

## 2017-11-12 DIAGNOSIS — I495 Sick sinus syndrome: Secondary | ICD-10-CM

## 2017-11-12 LAB — CUP PACEART INCLINIC DEVICE CHECK
Battery Remaining Longevity: 130 mo
Date Time Interrogation Session: 20190411102553
Implantable Lead Implant Date: 20190329
Implantable Lead Location: 753859
Implantable Pulse Generator Implant Date: 20190329
Lead Channel Pacing Threshold Amplitude: 0.5 V
Lead Channel Setting Pacing Amplitude: 3.5 V
Lead Channel Setting Pacing Amplitude: 3.5 V
Lead Channel Setting Pacing Pulse Width: 0.5 ms
Lead Channel Setting Sensing Sensitivity: 2 mV
MDC IDC LEAD IMPLANT DT: 20190329
MDC IDC LEAD LOCATION: 753860
MDC IDC MSMT BATTERY VOLTAGE: 3.05 V
MDC IDC MSMT LEADCHNL RA IMPEDANCE VALUE: 475 Ohm
MDC IDC MSMT LEADCHNL RA SENSING INTR AMPL: 5 mV
MDC IDC MSMT LEADCHNL RV IMPEDANCE VALUE: 612.5 Ohm
MDC IDC MSMT LEADCHNL RV PACING THRESHOLD AMPLITUDE: 0.5 V
MDC IDC MSMT LEADCHNL RV PACING THRESHOLD PULSEWIDTH: 0.5 ms
MDC IDC MSMT LEADCHNL RV PACING THRESHOLD PULSEWIDTH: 0.5 ms
MDC IDC MSMT LEADCHNL RV SENSING INTR AMPL: 12 mV
MDC IDC PG SERIAL: 9003317
MDC IDC STAT BRADY RA PERCENT PACED: 0.22 %
MDC IDC STAT BRADY RV PERCENT PACED: 14 %
Pulse Gen Model: 2272

## 2017-11-12 NOTE — Progress Notes (Signed)
Wound check appointment. Steri-strips removed. Wound without redness or edema. Incision edges approximated, wound well healed. Normal device function. Thresholds, sensing, and impedances consistent with implant measurements. Device programmed at 3.5V for extra safety margin until 3 month visit. Histogram distribution appropriate for patient and level of activity. 54% AT/AF- known PAF, on Xarelto and diltiazem. No high ventricular rates noted. Patient educated about wound care, arm mobility, lifting restrictions and Merlin monitoring. ROV with Dr. [name] 02/01/18.

## 2017-11-20 ENCOUNTER — Ambulatory Visit (HOSPITAL_COMMUNITY): Payer: Medicare Other

## 2017-11-23 ENCOUNTER — Encounter (HOSPITAL_COMMUNITY): Payer: Self-pay

## 2017-11-23 ENCOUNTER — Ambulatory Visit (HOSPITAL_COMMUNITY)
Admission: RE | Admit: 2017-11-23 | Discharge: 2017-11-23 | Disposition: A | Discharge status: Home / Self Care | Payer: Medicare Other | Source: Ambulatory Visit | Attending: Internal Medicine | Admitting: Internal Medicine

## 2017-11-23 DIAGNOSIS — C3490 Malignant neoplasm of unspecified part of unspecified bronchus or lung: Secondary | ICD-10-CM | POA: Diagnosis not present

## 2017-11-23 DIAGNOSIS — K8020 Calculus of gallbladder without cholecystitis without obstruction: Secondary | ICD-10-CM | POA: Insufficient documentation

## 2017-11-23 DIAGNOSIS — R161 Splenomegaly, not elsewhere classified: Secondary | ICD-10-CM | POA: Insufficient documentation

## 2017-11-23 DIAGNOSIS — K7460 Unspecified cirrhosis of liver: Secondary | ICD-10-CM | POA: Insufficient documentation

## 2017-11-23 MED ORDER — HEPARIN SOD (PORK) LOCK FLUSH 100 UNIT/ML IV SOLN
INTRAVENOUS | Status: AC
  Filled 2017-11-23: qty 5

## 2017-11-23 MED ORDER — IOHEXOL 300 MG/ML  SOLN
100.0000 mL | Freq: Once | INTRAMUSCULAR | Status: AC | PRN
  Administered 2017-11-23: 100 mL via INTRAVENOUS

## 2017-11-23 MED ORDER — IOPAMIDOL (ISOVUE-300) INJECTION 61%
30.0000 mL | Freq: Once | INTRAVENOUS | Status: AC
  Administered 2017-11-23: 30 mL via ORAL

## 2017-11-23 MED ORDER — IOPAMIDOL (ISOVUE-300) INJECTION 61%
INTRAVENOUS | Status: AC
  Filled 2017-11-23: qty 30

## 2017-11-24 ENCOUNTER — Inpatient Hospital Stay: Payer: Medicare Other | Attending: Internal Medicine | Admitting: Internal Medicine

## 2017-11-24 ENCOUNTER — Encounter: Payer: Self-pay | Admitting: Internal Medicine

## 2017-11-24 ENCOUNTER — Ambulatory Visit: Payer: Medicare Other

## 2017-11-24 ENCOUNTER — Encounter: Payer: Self-pay | Admitting: *Deleted

## 2017-11-24 ENCOUNTER — Telehealth: Payer: Self-pay | Admitting: Internal Medicine

## 2017-11-24 ENCOUNTER — Inpatient Hospital Stay: Payer: Medicare Other

## 2017-11-24 ENCOUNTER — Ambulatory Visit: Payer: Medicare Other | Admitting: Internal Medicine

## 2017-11-24 VITALS — BP 133/62 | HR 64 | Temp 98.1°F | Resp 17 | Ht 62.0 in | Wt 153.3 lb

## 2017-11-24 DIAGNOSIS — C3490 Malignant neoplasm of unspecified part of unspecified bronchus or lung: Secondary | ICD-10-CM

## 2017-11-24 DIAGNOSIS — C773 Secondary and unspecified malignant neoplasm of axilla and upper limb lymph nodes: Secondary | ICD-10-CM | POA: Diagnosis present

## 2017-11-24 DIAGNOSIS — Z5112 Encounter for antineoplastic immunotherapy: Secondary | ICD-10-CM | POA: Diagnosis present

## 2017-11-24 DIAGNOSIS — C3491 Malignant neoplasm of unspecified part of right bronchus or lung: Secondary | ICD-10-CM

## 2017-11-24 DIAGNOSIS — C3411 Malignant neoplasm of upper lobe, right bronchus or lung: Secondary | ICD-10-CM | POA: Diagnosis not present

## 2017-11-24 DIAGNOSIS — Z95828 Presence of other vascular implants and grafts: Secondary | ICD-10-CM

## 2017-11-24 LAB — CMP (CANCER CENTER ONLY)
ALBUMIN: 3.9 g/dL (ref 3.5–5.0)
ALT: 29 U/L (ref 0–55)
AST: 31 U/L (ref 5–34)
Alkaline Phosphatase: 109 U/L (ref 40–150)
Anion gap: 8 (ref 3–11)
BILIRUBIN TOTAL: 0.9 mg/dL (ref 0.2–1.2)
BUN: 11 mg/dL (ref 7–26)
CHLORIDE: 106 mmol/L (ref 98–109)
CO2: 26 mmol/L (ref 22–29)
CREATININE: 0.74 mg/dL (ref 0.60–1.10)
Calcium: 9.7 mg/dL (ref 8.4–10.4)
GFR, Est AFR Am: >60 mL/min (ref >60)
GFR, Estimated: >60 mL/min (ref >60)
GLUCOSE: 135 mg/dL (ref 70–140)
POTASSIUM: 4.1 mmol/L (ref 3.5–5.1)
Sodium: 140 mmol/L (ref 136–145)
Total Protein: 6.9 g/dL (ref 6.4–8.3)

## 2017-11-24 LAB — CBC WITH DIFFERENTIAL (CANCER CENTER ONLY)
BASOS ABS: 0 10*3/uL (ref 0.0–0.1)
BASOS PCT: 1 %
Eosinophils Absolute: 0.1 10*3/uL (ref 0.0–0.5)
Eosinophils Relative: 2 %
HEMATOCRIT: 38 % (ref 34.8–46.6)
Hemoglobin: 13.3 g/dL (ref 11.6–15.9)
LYMPHS PCT: 21 %
Lymphs Abs: 0.9 10*3/uL (ref 0.9–3.3)
MCH: 33.2 pg (ref 25.1–34.0)
MCHC: 34.9 g/dL (ref 31.5–36.0)
MCV: 95 fL (ref 79.5–101.0)
MONO ABS: 0.4 10*3/uL (ref 0.1–0.9)
Monocytes Relative: 8 %
NEUTROS ABS: 3 10*3/uL (ref 1.5–6.5)
NEUTROS PCT: 68 %
Platelet Count: 167 10*3/uL (ref 145–400)
RBC: 4 MIL/uL (ref 3.70–5.45)
RDW: 13.8 % (ref 11.2–14.5)
WBC: 4.4 10*3/uL (ref 3.9–10.3)

## 2017-11-24 MED ORDER — HEPARIN SOD (PORK) LOCK FLUSH 100 UNIT/ML IV SOLN
500.0000 [IU] | Freq: Once | INTRAVENOUS | Status: AC | PRN
  Administered 2017-11-24: 500 [IU]
  Filled 2017-11-24: qty 5

## 2017-11-24 MED ORDER — SODIUM CHLORIDE 0.9 % IV SOLN
Freq: Once | INTRAVENOUS | Status: AC
  Administered 2017-11-24: 13:00:00 via INTRAVENOUS

## 2017-11-24 MED ORDER — SODIUM CHLORIDE 0.9% FLUSH
10.0000 mL | INTRAVENOUS | Status: DC | PRN
  Administered 2017-11-24: 10 mL
  Filled 2017-11-24: qty 10

## 2017-11-24 MED ORDER — SODIUM CHLORIDE 0.9 % IV SOLN
480.0000 mg | Freq: Once | INTRAVENOUS | Status: AC
  Administered 2017-11-24: 480 mg via INTRAVENOUS
  Filled 2017-11-24: qty 48

## 2017-11-24 MED ORDER — SODIUM CHLORIDE 0.9% FLUSH
10.0000 mL | INTRAVENOUS | Status: DC | PRN
  Administered 2017-11-24: 10 mL via INTRAVENOUS
  Filled 2017-11-24: qty 10

## 2017-11-24 NOTE — Patient Instructions (Signed)

## 2017-11-24 NOTE — Progress Notes (Signed)
Oncology Nurse Navigator Documentation  Oncology Nurse Navigator Flowsheets 11/24/2017  Navigator Location [facility]  Navigator Encounter Type Clinic/MDC/I spoke with patient today. She is doing well on her treatment plan. She verbalized understanding plan of care. I offered support and encouraged her to keep on track with treatment plan.   Patient Visit Type MedOnc  Treatment Phase Treatment  Interventions Other  Acuity Level 1  Time Spent with Patient 15

## 2017-11-24 NOTE — Telephone Encounter (Signed)
Patient currently on schedule thru June for lab/fu/infusion as requested per 4/23 los. Patient given appointments for May and June.

## 2017-11-24 NOTE — Patient Instructions (Signed)
[facility] Discharge Instructions for Patients Receiving Chemotherapy  Today you received the following chemotherapy agents :  Opdivo.  To help prevent nausea and vomiting after your treatment, we encourage you to take your nausea medication as prescribed.   If you develop nausea and vomiting that is not controlled by your nausea medication, call the clinic.   BELOW ARE SYMPTOMS THAT SHOULD BE REPORTED IMMEDIATELY:  *FEVER GREATER THAN 100.5 F  *CHILLS WITH OR WITHOUT FEVER  NAUSEA AND VOMITING THAT IS NOT CONTROLLED WITH YOUR NAUSEA MEDICATION  *UNUSUAL SHORTNESS OF BREATH  *UNUSUAL BRUISING OR BLEEDING  TENDERNESS IN MOUTH AND THROAT WITH OR WITHOUT PRESENCE OF ULCERS  *URINARY PROBLEMS  *BOWEL PROBLEMS  UNUSUAL RASH Items with * indicate a potential emergency and should be followed up as soon as possible.  Feel free to call the clinic should you have any questions or concerns. The clinic phone number is [phone].  Please show the CHEMO ALERT CARD at check-in to the Emergency Department and triage nurse.   

## 2017-11-24 NOTE — Progress Notes (Signed)
[name] [phone]   [phone]  OFFICE PROGRESS NOTE  Harris, [name], [name] [address] [place] [address]  DIAGNOSIS: Metastatic non-small cell lung cancer, adenocarcinoma. This was initially diagnosed as stage IB (T2a, N0, M0) in March of 2014, status post right upper lobectomy with lymph node dissection but the patient has evidence for disease recurrence in the right axilla status post resection.  PRIOR THERAPY:  1) Status post right axillary lymph node biopsy on 05/01/2014 under the care of Dr. Roxan Hockey. 2) Systemic chemotherapy with carboplatin for AUC of 5 and Alimta 500 mg/M2 every 3 weeks. First cycle 05/31/2014. Status post 6 cycles. Last dose was given 09/22/2014. 3) palliative radiotherapy to the subcarinal lymph node under the care of Dr. Sondra Come. 4) Nivolumab 240 mg IV every 2 weeks. First dose 12/23/2016. Status post 6 cycles.  CURRENT THERAPY:  Nivolumab 480 mg IV every 4 weeks. First dose 03/17/2017. Status post 9 cycles.  INTERVAL HISTORY: Nancy Hodges 72 y.o. female returns to the clinic today for follow-up visit.  The patient is feeling fine today with no specific complaints.  She had a tick bite pulled from her mid back recently.  She denied having any symptoms.  She denied having any nausea, vomiting, diarrhea or constipation.  She has no chest pain, shortness of breath, cough or hemoptysis.  The patient denied having any recent weight loss or night sweats.  She continues to tolerate her treatment with immunotherapy fairly well.  She had repeat CT scan of the chest, abdomen and pelvis performed recently and she is here for evaluation and discussion of her discuss results.   MEDICAL HISTORY: Past Medical History:  Diagnosis Date  . Allergic rhinitis   . Anxiety   . Arthritis   . Asthma   . Atrial flutter ([facility])   . Cholelithiases 09/26/2015  . Diverticulosis   . Encounter for antineoplastic  immunotherapy 12/09/2016  . GERD (gastroesophageal reflux disease)   . H/O hiatal hernia   . History of blood transfusion   . History of chemotherapy   . History of kidney stones   . History of radiation therapy   . Internal hemorrhoid   . Lung cancer ([facility])    a. Stage IB non-small cell carcinoma, s/p R VATS, wedge resection, RU lobectomy 10/2012.  . Paroxysmal atrial fibrillation ([facility])    a. anticoagulated with Xarelto  . Pneumonia 06/2016   [facility]  . Presence of permanent cardiac pacemaker   . Pulmonary nodule   . 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    ALLERGIES:  is allergic to chlorpheniramine-dm; prednisone; septra [sulfamethoxazole-trimethoprim]; penicillins; and vioxx [rofecoxib].  MEDICATIONS:  Current Outpatient Medications  Medication Sig Dispense Refill  . acetaminophen (TYLENOL) 500 MG tablet Take 500 mg by mouth every 6 (six) hours as needed for moderate pain or fever.     [name] albuterol (PROVENTIL HFA;VENTOLIN HFA) 108 (90 BASE) MCG/ACT inhaler Inhale 1-2 puffs into the lungs every 6 (six) hours as needed for wheezing or shortness of breath.    . diltiazem (CARDIZEM CD) 240 MG 24 hr capsule Take 1 capsule (240 mg total) by mouth daily. 90 capsule 3  . hydroxypropyl methylcellulose / hypromellose (ISOPTO TEARS / GONIOVISC) 2.5 % ophthalmic solution Place 1-2 drops into the left eye as needed for dry eyes.    [name] ipratropium-albuterol (DUONEB) 0.5-2.5 (3) MG/3ML SOLN Take 3 mLs by nebulization  every 6 (six) hours as needed (shortness of breath or wheezing).     [name] lidocaine-prilocaine (EMLA) cream Apply 1 application topically as needed. (Patient taking differently: Apply 1 application topically as needed (for port). ) 30 g 4  . loratadine (CLARITIN) 10 MG tablet Take 10 mg by mouth daily.    . Multiple Vitamin (MULTIVITAMIN) tablet Take 1 tablet by mouth daily.    . ondansetron (ZOFRAN ODT) 4 MG disintegrating tablet  Take 1 tablet (4 mg total) by mouth every 8 (eight) hours as needed for nausea or vomiting. 6 tablet 0  . rivaroxaban (XARELTO) 20 MG TABS tablet Take 1 tablet (20 mg total) by mouth daily with supper. 30 tablet 11   No current facility-administered medications for this visit.     SURGICAL HISTORY:  Past Surgical History:  Procedure Laterality Date  . CATARACT EXTRACTION W/PHACO Right 08/14/2016   Procedure: CATARACT EXTRACTION PHACO AND INTRAOCULAR LENS PLACEMENT RIGHT EYE CDE 10.53;  Surgeon: Tonny Branch, MD;  Location: [facility];  Service: Ophthalmology;  Laterality: Right;  right  . CATARACT EXTRACTION W/PHACO Left 09/25/2016   Procedure: CATARACT EXTRACTION PHACO AND INTRAOCULAR LENS PLACEMENT (IOC);  Surgeon: Tonny Branch, MD;  Location: [facility];  Service: Ophthalmology;  Laterality: Left;  left CDE 8.81  . COLONOSCOPY     [facility]: cattered sigmoid diverticula, internal hemorrhoids, sessile polyp in ascending and mid transverse colon (tubular adenoma).   . colonoscopy with polypectomy  2015  . ESOPHAGOGASTRODUODENOSCOPY     [facility]: bile reflux in stomach, mild gastritis, hiatal hernia  . ESOPHAGOGASTRODUODENOSCOPY N/A 01/31/2016   Procedure: ESOPHAGOGASTRODUODENOSCOPY (EGD);  Surgeon: [name], MD;  Location: [facility];  Service: Endoscopy;  Laterality: N/A;  1200  . IR FLUORO GUIDE PORT INSERTION RIGHT  06/30/2017  . IR US GUIDE VASC ACCESS RIGHT  06/30/2017  . LOBECTOMY Right 10/27/2012   Procedure: LOBECTOMY;  Surgeon: [name], MD;  Location: [facility];  Service: Thoracic;  Laterality: Right;   RIGHT UPPER LOBECTOMY & Node Dissection  . LYMPH NODE BIOPSY Right 05/01/2014   Procedure: LYMPH NODE BIOPSY, Right Axillary;  Surgeon: [name], MD;  Location: [facility];  Service: Thoracic;  Laterality: Right;  . PACEMAKER IMPLANT N/A 10/30/2017   Procedure: PACEMAKER IMPLANT;  Surgeon: [name], MD;  Location: [facility];  Service: Cardiovascular;   Laterality: N/A;  . PARTIAL HYSTERECTOMY    . PERMANENT PACEMAKER INSERTION N/A 09/14/2012   Nanostim (SJM) leadless pacemaker (LEADLESS II STUDY PATIENT) implanted by Dr [name]  . VIDEO ASSISTED THORACOSCOPY (VATS)/WEDGE RESECTION Right 10/27/2012   Procedure: VIDEO ASSISTED THORACOSCOPY (VATS),RGHT UPPER LOBE LUNG WEDGE RESECTION;  Surgeon: [name], MD;  Location: [facility];  Service: Thoracic;  Laterality: Right;    REVIEW OF SYSTEMS:  Constitutional: negative Eyes: negative Ears, nose, mouth, throat, and face: negative Respiratory: negative Cardiovascular: negative Gastrointestinal: negative Genitourinary:negative Integument/breast: negative Hematologic/lymphatic: negative Musculoskeletal:negative Neurological: negative Behavioral/Psych: negative Endocrine: negative Allergic/Immunologic: negative   PHYSICAL EXAMINATION: General appearance: alert, cooperative and no distress Head: Normocephalic, without obvious abnormality, atraumatic Neck: no adenopathy, no JVD, supple, symmetrical, trachea midline and thyroid not enlarged, symmetric, no tenderness/mass/nodules Lymph nodes: Cervical, supraclavicular, and axillary nodes normal. Resp: clear to auscultation bilaterally Back: symmetric, no curvature. ROM normal. No CVA tenderness. Cardio: regular rate and rhythm, S1, S2 normal, no murmur, click, rub or gallop GI: soft, non-tender; bowel sounds normal; no masses,  no organomegaly Extremities: extremities normal, atraumatic, no cyanosis or edema Neurologic:  Alert and oriented X 3, normal strength and tone. Normal symmetric reflexes. Normal coordination and gait  ECOG PERFORMANCE STATUS: 1 - Symptomatic but completely ambulatory  Blood pressure 133/62, pulse 64, temperature 98.1 F (36.7 C), temperature source Oral, resp. rate 17, height 5\' 2"  (1.575 m), weight 153 lb 4.8 oz (69.5 kg), SpO2 98 %.  LABORATORY DATA: Lab Results  Component Value Date   WBC 4.4 11/24/2017    HGB 13.3 11/24/2017   HCT 38.0 11/24/2017   MCV 95.0 11/24/2017   PLT 167 11/24/2017      Chemistry      Component Value Date/Time   NA 140 11/24/2017 0938   NA 139 08/05/2017 0902   K 4.1 11/24/2017 0938   K 4.1 08/05/2017 0902   CL 106 11/24/2017 0938   CO2 26 11/24/2017 0938   CO2 28 08/05/2017 0902   BUN 11 11/24/2017 0938   BUN 10.1 08/05/2017 0902   CREATININE 0.74 11/24/2017 0938   CREATININE 0.8 08/05/2017 0902   GLU 134 (H) 02/18/2017 1410      Component Value Date/Time   CALCIUM 9.7 11/24/2017 0938   CALCIUM 9.5 08/05/2017 0902   ALKPHOS 109 11/24/2017 0938   ALKPHOS 111 08/05/2017 0902   AST 31 11/24/2017 0938   AST 33 08/05/2017 0902   ALT 29 11/24/2017 0938   ALT 34 08/05/2017 0902   BILITOT 0.9 11/24/2017 0938   BILITOT 1.39 (H) 08/05/2017 0902       RADIOGRAPHIC STUDIES: Dg Chest 2 View  Result Date: 10/31/2017 CLINICAL DATA:  Cardiac device in place EXAM: CHEST - 2 VIEW COMPARISON:  This 09/13/2017 FINDINGS: Dual-chamber pacer leads from the left are in typical position for the right atrial appendage and right ventricle. Porta catheter on the right. Implantable loop recorder. Normal heart size. There is hyperinflation and chronic pleural based thickening on the right. There is no edema, consolidation, effusion, or pneumothorax. IMPRESSION: No acute finding after dual-chamber pacer implant. Electronically Signed   By: Monte Fantasia M.D.   On: 10/31/2017 08:42   Ct Chest W Contrast  Result Date: 11/23/2017 CLINICAL DATA:  Patient with history of lung cancer. Follow-up evaluation. EXAM: CT CHEST, ABDOMEN, AND PELVIS WITH CONTRAST TECHNIQUE: Multidetector CT imaging of the chest, abdomen and pelvis was performed following the standard protocol during bolus administration of intravenous contrast. CONTRAST:  63mL ISOVUE-300 IOPAMIDOL (ISOVUE-300) INJECTION 61%, 166mL OMNIPAQUE IOHEXOL 300 MG/ML SOLN COMPARISON:  CT CAP 08/31/2017. FINDINGS: CT CHEST FINDINGS  Cardiovascular: Pacer apparatus overlies the left hemithorax. Right anterior chest wall Port-A-Cath is present with tip terminating in the superior vena cava. Normal heart size. Coronary arterial vascular calcifications. Thoracic aortic vascular calcifications. Mediastinum/Nodes: Postsurgical changes right axilla. No enlarged axillary, mediastinal or hilar lymphadenopathy. Small hiatal hernia. Lungs/Pleura: Central airways are patent. Centrilobular and paraseptal emphysematous change. Stable biapical pleuroparenchymal scarring. Patient status post right upper lobectomy with stable postsurgical changes within the right hemithorax. Medial right lower lobe post radiation changes, similar to prior. Unchanged ground-glass nodule left apex measuring 7 mm (image 36; series 6). No pleural effusion or pneumothorax. Musculoskeletal: Thoracic spine degenerative changes. No aggressive or acute appearing osseous lesions. CT ABDOMEN PELVIS FINDINGS Hepatobiliary: Re- demonstrated nodular contour of the liver. Gallbladder is mildly enlarged. Multiple stones within the gallbladder lumen. No intrahepatic or extrahepatic biliary ductal dilatation. Pancreas: Unremarkable Spleen: Enlarged measuring 14.6 cm. Grossly unchanged 1.2 cm low-attenuation lesion in the spleen (image 67; series 2). Adrenals/Urinary Tract: Adrenal glands are normal. Kidneys enhance symmetrically  with contrast. Focal atrophy involving the inferior pole of the right kidney and superior pole of the left kidney. Urinary bladder is unremarkable. Stomach/Bowel: Descending and sigmoid colonic diverticulosis. No CT evidence for acute diverticulitis. Normal appendix. No evidence for bowel obstruction. Normal morphology of the stomach. No free fluid or free intraperitoneal air. Vascular/Lymphatic: Normal caliber abdominal aorta. Peripheral calcified atherosclerotic plaque. No retroperitoneal lymphadenopathy. Reproductive: Uterus is surgically absent. Other: None.  Musculoskeletal: No aggressive or acute appearing osseous lesions. Lumbar spine degenerative changes. IMPRESSION: 1. Postsurgical changes compatible with right upper lobectomy without evidence for recurrent or metastatic disease. 2. Morphologic changes to the liver compatible with cirrhosis and splenomegaly. 3. Cholelithiasis. Electronically Signed   By: Lovey Newcomer M.D.   On: 11/23/2017 17:12   Ct Abdomen Pelvis W Contrast  Result Date: 11/23/2017 CLINICAL DATA:  Patient with history of lung cancer. Follow-up evaluation. EXAM: CT CHEST, ABDOMEN, AND PELVIS WITH CONTRAST TECHNIQUE: Multidetector CT imaging of the chest, abdomen and pelvis was performed following the standard protocol during bolus administration of intravenous contrast. CONTRAST:  36mL ISOVUE-300 IOPAMIDOL (ISOVUE-300) INJECTION 61%, 155mL OMNIPAQUE IOHEXOL 300 MG/ML SOLN COMPARISON:  CT CAP 08/31/2017. FINDINGS: CT CHEST FINDINGS Cardiovascular: Pacer apparatus overlies the left hemithorax. Right anterior chest wall Port-A-Cath is present with tip terminating in the superior vena cava. Normal heart size. Coronary arterial vascular calcifications. Thoracic aortic vascular calcifications. Mediastinum/Nodes: Postsurgical changes right axilla. No enlarged axillary, mediastinal or hilar lymphadenopathy. Small hiatal hernia. Lungs/Pleura: Central airways are patent. Centrilobular and paraseptal emphysematous change. Stable biapical pleuroparenchymal scarring. Patient status post right upper lobectomy with stable postsurgical changes within the right hemithorax. Medial right lower lobe post radiation changes, similar to prior. Unchanged ground-glass nodule left apex measuring 7 mm (image 36; series 6). No pleural effusion or pneumothorax. Musculoskeletal: Thoracic spine degenerative changes. No aggressive or acute appearing osseous lesions. CT ABDOMEN PELVIS FINDINGS Hepatobiliary: Re- demonstrated nodular contour of the liver. Gallbladder is mildly  enlarged. Multiple stones within the gallbladder lumen. No intrahepatic or extrahepatic biliary ductal dilatation. Pancreas: Unremarkable Spleen: Enlarged measuring 14.6 cm. Grossly unchanged 1.2 cm low-attenuation lesion in the spleen (image 67; series 2). Adrenals/Urinary Tract: Adrenal glands are normal. Kidneys enhance symmetrically with contrast. Focal atrophy involving the inferior pole of the right kidney and superior pole of the left kidney. Urinary bladder is unremarkable. Stomach/Bowel: Descending and sigmoid colonic diverticulosis. No CT evidence for acute diverticulitis. Normal appendix. No evidence for bowel obstruction. Normal morphology of the stomach. No free fluid or free intraperitoneal air. Vascular/Lymphatic: Normal caliber abdominal aorta. Peripheral calcified atherosclerotic plaque. No retroperitoneal lymphadenopathy. Reproductive: Uterus is surgically absent. Other: None. Musculoskeletal: No aggressive or acute appearing osseous lesions. Lumbar spine degenerative changes. IMPRESSION: 1. Postsurgical changes compatible with right upper lobectomy without evidence for recurrent or metastatic disease. 2. Morphologic changes to the liver compatible with cirrhosis and splenomegaly. 3. Cholelithiasis. Electronically Signed   By: Lovey Newcomer M.D.   On: 11/23/2017 17:12   ASSESSMENT AND PLAN:  This is a very pleasant 72 years old white female with metastatic non-small cell lung cancer, adenocarcinoma with no actionable mutations status post systemic chemotherapy with carboplatin and Alimta and she is currently undergoing treatment with second line immunotherapy with Nivolumab status post 6 cycles. This was followed by immunotherapy with Nivolumab 480 mg IV every 4 weeks status post 9 cycles. She continues to tolerate the treatment well with no concerning complaints. She had repeat CT scan of the chest, abdomen and pelvis performed recently.  I  personally and independently reviewed the scan images  and discussed the results with the patient today.  Her scan showed no concerning findings for disease progression.  I recommended for her to continue her current treatment with Nivolumab and she will proceed with cycle #10 today. The patient will come back for follow-up visit in 4 weeks for evaluation before the next cycle of her treatment. She was advised to call immediately if she has any concerning symptoms in the interval. The patient voices understanding of current disease status and treatment options and is in agreement with the current care plan. All questions were answered. The patient knows to call the clinic with any problems, questions or concerns. We can certainly see the patient much sooner if necessary.  Disclaimer: This note was dictated with voice recognition software. Similar sounding words can inadvertently be transcribed and may not be corrected upon review.

## 2017-11-25 ENCOUNTER — Telehealth: Payer: Self-pay | Admitting: *Deleted

## 2017-11-25 NOTE — Telephone Encounter (Signed)
   [facility] Pre-operative Risk Assessment    Request for surgical clearance:  1. What type of surgery is being performed? Dental treatment consisting of possible cleanings, fillings, crowns, dentures, and/or extractions  2. When is this surgery scheduled? Pending Clearance  3. What type of clearance is required (medical clearance vs. Pharmacy clearance to hold med vs. Both)? Both, patient is allergic to PCN. Asking about any special precautions.   4. Are there any medications that need to be held prior to surgery and how long? Pending Provider Clearance  5. Practice name and name of physician performing surgery? [facility]  6. What is your office phone number [phone]   7.   What is your office fax number [phone]  8.   Anesthesia type (None, local, MAC, general) ? Local   Abdi Husak 11/25/2017, 12:17 PM  _________________________________________________________________   (provider comments below)

## 2017-11-25 NOTE — Telephone Encounter (Signed)
Dr [name], this pt had a pacemaker 10/30/17, now needs dental work. ? SBE prophylaxis needed. She is also on Xarelto but this can be continued based on what she will have done.   Kerin Ransom PA-C 11/25/2017 4:04 PM

## 2017-11-26 NOTE — Telephone Encounter (Signed)
   It appears that [facility] meant to route this to Dr. [name], will do so.  Charlie Pitter, PA-C 11/26/2017, 2:32 PM

## 2017-11-30 NOTE — Telephone Encounter (Signed)
No specific clearance needed at this time. We have spoken with "Angie" at the dentist office. Dentist has been advised that we would prefer waiting 6 weeks post PPM implant prior to procedures. We have left a message on the patient's home phone as well.   Burtis Junes, RN, [facility] [address] [place], [place]  [address] [phone]

## 2017-12-21 ENCOUNTER — Other Ambulatory Visit: Payer: Self-pay | Admitting: Medical Oncology

## 2017-12-21 DIAGNOSIS — C3491 Malignant neoplasm of unspecified part of right bronchus or lung: Secondary | ICD-10-CM

## 2017-12-22 ENCOUNTER — Inpatient Hospital Stay: Payer: Medicare Other

## 2017-12-22 ENCOUNTER — Inpatient Hospital Stay: Payer: Medicare Other | Attending: Internal Medicine | Admitting: Internal Medicine

## 2017-12-22 ENCOUNTER — Other Ambulatory Visit: Payer: Medicare Other

## 2017-12-22 ENCOUNTER — Encounter: Payer: Self-pay | Admitting: Internal Medicine

## 2017-12-22 VITALS — BP 137/68 | HR 79 | Temp 98.1°F | Resp 17 | Ht 62.0 in | Wt 153.7 lb

## 2017-12-22 DIAGNOSIS — Z95828 Presence of other vascular implants and grafts: Secondary | ICD-10-CM

## 2017-12-22 DIAGNOSIS — C3491 Malignant neoplasm of unspecified part of right bronchus or lung: Secondary | ICD-10-CM

## 2017-12-22 DIAGNOSIS — Z5112 Encounter for antineoplastic immunotherapy: Secondary | ICD-10-CM | POA: Diagnosis present

## 2017-12-22 DIAGNOSIS — C773 Secondary and unspecified malignant neoplasm of axilla and upper limb lymph nodes: Secondary | ICD-10-CM | POA: Diagnosis not present

## 2017-12-22 DIAGNOSIS — C3411 Malignant neoplasm of upper lobe, right bronchus or lung: Secondary | ICD-10-CM | POA: Insufficient documentation

## 2017-12-22 DIAGNOSIS — C3490 Malignant neoplasm of unspecified part of unspecified bronchus or lung: Secondary | ICD-10-CM

## 2017-12-22 LAB — CMP (CANCER CENTER ONLY)
ALT: 26 U/L (ref 0–55)
ANION GAP: 7 (ref 3–11)
AST: 33 U/L (ref 5–34)
Albumin: 3.9 g/dL (ref 3.5–5.0)
Alkaline Phosphatase: 96 U/L (ref 40–150)
BUN: 13 mg/dL (ref 7–26)
CHLORIDE: 107 mmol/L (ref 98–109)
CO2: 26 mmol/L (ref 22–29)
Calcium: 9.3 mg/dL (ref 8.4–10.4)
Creatinine: 0.75 mg/dL (ref 0.60–1.10)
Glucose, Bld: 110 mg/dL (ref 70–140)
Potassium: 4.2 mmol/L (ref 3.5–5.1)
SODIUM: 140 mmol/L (ref 136–145)
Total Bilirubin: 1.4 mg/dL — ABNORMAL HIGH (ref 0.2–1.2)
Total Protein: 6.8 g/dL (ref 6.4–8.3)

## 2017-12-22 LAB — CBC WITH DIFFERENTIAL (CANCER CENTER ONLY)
BASOS ABS: 0 10*3/uL (ref 0.0–0.1)
Basophils Relative: 1 %
EOS ABS: 0.1 10*3/uL (ref 0.0–0.5)
Eosinophils Relative: 1 %
HCT: 37.1 % (ref 34.8–46.6)
HEMOGLOBIN: 12.6 g/dL (ref 11.6–15.9)
LYMPHS ABS: 1 10*3/uL (ref 0.9–3.3)
LYMPHS PCT: 20 %
MCH: 33 pg (ref 25.1–34.0)
MCHC: 34 g/dL (ref 31.5–36.0)
MCV: 97.1 fL (ref 79.5–101.0)
Monocytes Absolute: 0.4 10*3/uL (ref 0.1–0.9)
Monocytes Relative: 7 %
NEUTROS PCT: 71 %
Neutro Abs: 3.6 10*3/uL (ref 1.5–6.5)
PLATELETS: 150 10*3/uL (ref 145–400)
RBC: 3.82 MIL/uL (ref 3.70–5.45)
RDW: 13.8 % (ref 11.2–14.5)
WBC: 5 10*3/uL (ref 3.9–10.3)

## 2017-12-22 MED ORDER — SODIUM CHLORIDE 0.9 % IV SOLN
Freq: Once | INTRAVENOUS | Status: AC
  Administered 2017-12-22: 10:00:00 via INTRAVENOUS

## 2017-12-22 MED ORDER — HEPARIN SOD (PORK) LOCK FLUSH 100 UNIT/ML IV SOLN
500.0000 [IU] | Freq: Once | INTRAVENOUS | Status: AC | PRN
  Administered 2017-12-22: 500 [IU]
  Filled 2017-12-22: qty 5

## 2017-12-22 MED ORDER — SODIUM CHLORIDE 0.9 % IV SOLN
480.0000 mg | Freq: Once | INTRAVENOUS | Status: AC
  Administered 2017-12-22: 480 mg via INTRAVENOUS
  Filled 2017-12-22: qty 48

## 2017-12-22 MED ORDER — SODIUM CHLORIDE 0.9% FLUSH
10.0000 mL | INTRAVENOUS | Status: DC | PRN
  Administered 2017-12-22: 10 mL
  Filled 2017-12-22: qty 10

## 2017-12-22 MED ORDER — SODIUM CHLORIDE 0.9% FLUSH
10.0000 mL | INTRAVENOUS | Status: DC | PRN
  Administered 2017-12-22: 10 mL via INTRAVENOUS
  Filled 2017-12-22: qty 10

## 2017-12-22 NOTE — Patient Instructions (Addendum)
[facility] Discharge Instructions for Patients Receiving Chemotherapy  Today you received the following chemotherapy agents :  Opdivo.  To help prevent nausea and vomiting after your treatment, we encourage you to take your nausea medication as prescribed.   If you develop nausea and vomiting that is not controlled by your nausea medication, call the clinic.   BELOW ARE SYMPTOMS THAT SHOULD BE REPORTED IMMEDIATELY:  *FEVER GREATER THAN 100.5 F  *CHILLS WITH OR WITHOUT FEVER  NAUSEA AND VOMITING THAT IS NOT CONTROLLED WITH YOUR NAUSEA MEDICATION  *UNUSUAL SHORTNESS OF BREATH  *UNUSUAL BRUISING OR BLEEDING  TENDERNESS IN MOUTH AND THROAT WITH OR WITHOUT PRESENCE OF ULCERS  *URINARY PROBLEMS  *BOWEL PROBLEMS  UNUSUAL RASH Items with * indicate a potential emergency and should be followed up as soon as possible.  Feel free to call the clinic should you have any questions or concerns. The clinic phone number is [phone].  Please show the CHEMO ALERT CARD at check-in to the Emergency Department and triage nurse.   

## 2017-12-22 NOTE — Progress Notes (Signed)
[facility] Telephone:([phone]) [phone]   Fax:([phone]) [phone]  OFFICE PROGRESS NOTE  Harris, [name], [name] [address] [place] [address]  DIAGNOSIS: Metastatic non-small cell lung cancer, adenocarcinoma. This was initially diagnosed as stage IB (T2a, N0, M0) in March of 2014, status post right upper lobectomy with lymph node dissection but the patient has evidence for disease recurrence in the right axilla status post resection.  PRIOR THERAPY:  1) Status post right axillary lymph node biopsy on 05/01/2014 under the care of Dr. Roxan Hockey. 2) Systemic chemotherapy with carboplatin for AUC of 5 and Alimta 500 mg/M2 every 3 weeks. First cycle 05/31/2014. Status post 6 cycles. Last dose was given 09/22/2014. 3) palliative radiotherapy to the subcarinal lymph node under the care of Dr. Sondra Come. 4) Nivolumab 240 mg IV every 2 weeks. First dose 12/23/2016. Status post 6 cycles.  CURRENT THERAPY:  Nivolumab 480 mg IV every 4 weeks. First dose 03/17/2017. Status post 10 cycles.  INTERVAL HISTORY: Nancy Hodges 72 y.o. female returns to the clinic today for follow-up visit.  The patient is feeling fine today with no specific complaints.  She continues to tolerate her treatment with Nivolumab fairly well.  She has a vertigo episode recently and she took meclizine and she felt much better.  She denied having any chest pain, shortness breath, cough or hemoptysis.  She denied having any fever or chills.  She has no nausea, vomiting, diarrhea or constipation.  She is here today for evaluation before starting cycle #11.   MEDICAL HISTORY: Past Medical History:  Diagnosis Date  . Allergic rhinitis   . Anxiety   . Arthritis   . Asthma   . Atrial flutter ([place])   . Cholelithiases 09/26/2015  . Diverticulosis   . Encounter for antineoplastic immunotherapy 12/09/2016  . GERD (gastroesophageal reflux disease)   . H/O hiatal hernia   . History of blood transfusion   .  History of chemotherapy   . History of kidney stones   . History of radiation therapy   . Internal hemorrhoid   . Lung cancer ([place])    a. Stage IB non-small cell carcinoma, s/p R VATS, wedge resection, RU lobectomy 10/2012.  . Paroxysmal atrial fibrillation ([facility])    a. anticoagulated with Xarelto  . Pneumonia 06/2016   [facility]  . Presence of permanent cardiac pacemaker   . Pulmonary nodule   . Tachycardia-bradycardia syndrome ([place])    a. 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    ALLERGIES:  is allergic to chlorpheniramine-dm; prednisone; septra [sulfamethoxazole-trimethoprim]; penicillins; and vioxx [rofecoxib].  MEDICATIONS:  Current Outpatient Medications  Medication Sig Dispense Refill  . acetaminophen (TYLENOL) 500 MG tablet Take 500 mg by mouth every 6 (six) hours as needed for moderate pain or fever.     [name] albuterol (PROVENTIL HFA;VENTOLIN HFA) 108 (90 BASE) MCG/ACT inhaler Inhale 1-2 puffs into the lungs every 6 (six) hours as needed for wheezing or shortness of breath.    . diltiazem (CARDIZEM CD) 240 MG 24 hr capsule Take 1 capsule (240 mg total) by mouth daily. 90 capsule 3  . hydroxypropyl methylcellulose / hypromellose (ISOPTO TEARS / GONIOVISC) 2.5 % ophthalmic solution Place 1-2 drops into the left eye as needed for dry eyes.    [name] ipratropium-albuterol (DUONEB) 0.5-2.5 (3) MG/3ML SOLN Take 3 mLs by nebulization every 6 (six) hours as needed (shortness of breath or wheezing).     [name] lidocaine-prilocaine (EMLA) cream Apply 1 application  topically as needed. (Patient taking differently: Apply 1 application topically as needed (for port). ) 30 g 4  . loratadine (CLARITIN) 10 MG tablet Take 10 mg by mouth daily.    . meclizine (ANTIVERT) 12.5 MG tablet Take 12.5 mg by mouth 3 (three) times daily as needed for dizziness. Takes 1/2 tablet tid as needed.    . Multiple Vitamin (MULTIVITAMIN) tablet Take 1 tablet by mouth daily.    . rivaroxaban (XARELTO) 20 MG TABS tablet  Take 1 tablet (20 mg total) by mouth daily with supper. 30 tablet 11  . ondansetron (ZOFRAN ODT) 4 MG disintegrating tablet Take 1 tablet (4 mg total) by mouth every 8 (eight) hours as needed for nausea or vomiting. (Patient not taking: Reported on 12/22/2017) 6 tablet 0   No current facility-administered medications for this visit.     SURGICAL HISTORY:  Past Surgical History:  Procedure Laterality Date  . CATARACT EXTRACTION W/PHACO Right 08/14/2016   Procedure: CATARACT EXTRACTION PHACO AND INTRAOCULAR LENS PLACEMENT RIGHT EYE CDE 10.53;  Surgeon: Tonny Branch, MD;  Location: [facility];  Service: Ophthalmology;  Laterality: Right;  right  . CATARACT EXTRACTION W/PHACO Left 09/25/2016   Procedure: CATARACT EXTRACTION PHACO AND INTRAOCULAR LENS PLACEMENT (IOC);  Surgeon: Tonny Branch, MD;  Location: [facility];  Service: Ophthalmology;  Laterality: Left;  left CDE 8.81  . COLONOSCOPY     [facility]: cattered sigmoid diverticula, internal hemorrhoids, sessile polyp in ascending and mid transverse colon (tubular adenoma).   . colonoscopy with polypectomy  2015  . ESOPHAGOGASTRODUODENOSCOPY     [facility]: bile reflux in stomach, mild gastritis, hiatal hernia  . ESOPHAGOGASTRODUODENOSCOPY N/A 01/31/2016   Procedure: ESOPHAGOGASTRODUODENOSCOPY (EGD);  Surgeon: [name], MD;  Location: [facility];  Service: Endoscopy;  Laterality: N/A;  1200  . IR FLUORO GUIDE PORT INSERTION RIGHT  06/30/2017  . IR US GUIDE VASC ACCESS RIGHT  06/30/2017  . LOBECTOMY Right 10/27/2012   Procedure: LOBECTOMY;  Surgeon: [name], MD;  Location: [facility];  Service: Thoracic;  Laterality: Right;   RIGHT UPPER LOBECTOMY & Node Dissection  . LYMPH NODE BIOPSY Right 05/01/2014   Procedure: LYMPH NODE BIOPSY, Right Axillary;  Surgeon: [name], MD;  Location: [facility];  Service: Thoracic;  Laterality: Right;  . PACEMAKER IMPLANT N/A 10/30/2017   Procedure: PACEMAKER IMPLANT;  Surgeon: [name], MD;  Location:  [facility];  Service: Cardiovascular;  Laterality: N/A;  . PARTIAL HYSTERECTOMY    . PERMANENT PACEMAKER INSERTION N/A 09/14/2012   Nanostim (SJM) leadless pacemaker (LEADLESS II STUDY PATIENT) implanted by Dr [name]  . VIDEO ASSISTED THORACOSCOPY (VATS)/WEDGE RESECTION Right 10/27/2012   Procedure: VIDEO ASSISTED THORACOSCOPY (VATS),RGHT UPPER LOBE LUNG WEDGE RESECTION;  Surgeon: [name], MD;  Location: [facility];  Service: Thoracic;  Laterality: Right;    REVIEW OF SYSTEMS:  A comprehensive review of systems was negative.   PHYSICAL EXAMINATION: General appearance: alert, cooperative and no distress Head: Normocephalic, without obvious abnormality, atraumatic Neck: no adenopathy, no JVD, supple, symmetrical, trachea midline and thyroid not enlarged, symmetric, no tenderness/mass/nodules Lymph nodes: Cervical, supraclavicular, and axillary nodes normal. Resp: clear to auscultation bilaterally Back: symmetric, no curvature. ROM normal. No CVA tenderness. Cardio: regular rate and rhythm, S1, S2 normal, no murmur, click, rub or gallop GI: soft, non-tender; bowel sounds normal; no masses,  no organomegaly Extremities: extremities normal, atraumatic, no cyanosis or edema  ECOG PERFORMANCE STATUS: 1 - Symptomatic but completely ambulatory  Blood pressure  137/68, pulse 79, temperature 98.1 F (36.7 C), temperature source Oral, resp. rate 17, height 5\' 2"  (1.575 m), weight 153 lb 11.2 oz (69.7 kg), SpO2 98 %.  LABORATORY DATA: Lab Results  Component Value Date   WBC 5.0 12/22/2017   HGB 12.6 12/22/2017   HCT 37.1 12/22/2017   MCV 97.1 12/22/2017   PLT 150 12/22/2017      Chemistry      Component Value Date/Time   NA 140 11/24/2017 0938   NA 139 08/05/2017 0902   K 4.1 11/24/2017 0938   K 4.1 08/05/2017 0902   CL 106 11/24/2017 0938   CO2 26 11/24/2017 0938   CO2 28 08/05/2017 0902   BUN 11 11/24/2017 0938   BUN 10.1 08/05/2017 0902   CREATININE 0.74 11/24/2017  0938   CREATININE 0.8 08/05/2017 0902   GLU 134 (H) 02/18/2017 1410      Component Value Date/Time   CALCIUM 9.7 11/24/2017 0938   CALCIUM 9.5 08/05/2017 0902   ALKPHOS 109 11/24/2017 0938   ALKPHOS 111 08/05/2017 0902   AST 31 11/24/2017 0938   AST 33 08/05/2017 0902   ALT 29 11/24/2017 0938   ALT 34 08/05/2017 0902   BILITOT 0.9 11/24/2017 0938   BILITOT 1.39 (H) 08/05/2017 0902       RADIOGRAPHIC STUDIES: Ct Chest W Contrast  Result Date: 11/23/2017 CLINICAL DATA:  Patient with history of lung cancer. Follow-up evaluation. EXAM: CT CHEST, ABDOMEN, AND PELVIS WITH CONTRAST TECHNIQUE: Multidetector CT imaging of the chest, abdomen and pelvis was performed following the standard protocol during bolus administration of intravenous contrast. CONTRAST:  7mL ISOVUE-300 IOPAMIDOL (ISOVUE-300) INJECTION 61%, 143mL OMNIPAQUE IOHEXOL 300 MG/ML SOLN COMPARISON:  CT CAP 08/31/2017. FINDINGS: CT CHEST FINDINGS Cardiovascular: Pacer apparatus overlies the left hemithorax. Right anterior chest wall Port-A-Cath is present with tip terminating in the superior vena cava. Normal heart size. Coronary arterial vascular calcifications. Thoracic aortic vascular calcifications. Mediastinum/Nodes: Postsurgical changes right axilla. No enlarged axillary, mediastinal or hilar lymphadenopathy. Small hiatal hernia. Lungs/Pleura: Central airways are patent. Centrilobular and paraseptal emphysematous change. Stable biapical pleuroparenchymal scarring. Patient status post right upper lobectomy with stable postsurgical changes within the right hemithorax. Medial right lower lobe post radiation changes, similar to prior. Unchanged ground-glass nodule left apex measuring 7 mm (image 36; series 6). No pleural effusion or pneumothorax. Musculoskeletal: Thoracic spine degenerative changes. No aggressive or acute appearing osseous lesions. CT ABDOMEN PELVIS FINDINGS Hepatobiliary: Re- demonstrated nodular contour of the liver.  Gallbladder is mildly enlarged. Multiple stones within the gallbladder lumen. No intrahepatic or extrahepatic biliary ductal dilatation. Pancreas: Unremarkable Spleen: Enlarged measuring 14.6 cm. Grossly unchanged 1.2 cm low-attenuation lesion in the spleen (image 67; series 2). Adrenals/Urinary Tract: Adrenal glands are normal. Kidneys enhance symmetrically with contrast. Focal atrophy involving the inferior pole of the right kidney and superior pole of the left kidney. Urinary bladder is unremarkable. Stomach/Bowel: Descending and sigmoid colonic diverticulosis. No CT evidence for acute diverticulitis. Normal appendix. No evidence for bowel obstruction. Normal morphology of the stomach. No free fluid or free intraperitoneal air. Vascular/Lymphatic: Normal caliber abdominal aorta. Peripheral calcified atherosclerotic plaque. No retroperitoneal lymphadenopathy. Reproductive: Uterus is surgically absent. Other: None. Musculoskeletal: No aggressive or acute appearing osseous lesions. Lumbar spine degenerative changes. IMPRESSION: 1. Postsurgical changes compatible with right upper lobectomy without evidence for recurrent or metastatic disease. 2. Morphologic changes to the liver compatible with cirrhosis and splenomegaly. 3. Cholelithiasis. Electronically Signed   By: Lovey Newcomer M.D.   On:  11/23/2017 17:12   Ct Abdomen Pelvis W Contrast  Result Date: 11/23/2017 CLINICAL DATA:  Patient with history of lung cancer. Follow-up evaluation. EXAM: CT CHEST, ABDOMEN, AND PELVIS WITH CONTRAST TECHNIQUE: Multidetector CT imaging of the chest, abdomen and pelvis was performed following the standard protocol during bolus administration of intravenous contrast. CONTRAST:  87mL ISOVUE-300 IOPAMIDOL (ISOVUE-300) INJECTION 61%, 138mL OMNIPAQUE IOHEXOL 300 MG/ML SOLN COMPARISON:  CT CAP 08/31/2017. FINDINGS: CT CHEST FINDINGS Cardiovascular: Pacer apparatus overlies the left hemithorax. Right anterior chest wall Port-A-Cath is  present with tip terminating in the superior vena cava. Normal heart size. Coronary arterial vascular calcifications. Thoracic aortic vascular calcifications. Mediastinum/Nodes: Postsurgical changes right axilla. No enlarged axillary, mediastinal or hilar lymphadenopathy. Small hiatal hernia. Lungs/Pleura: Central airways are patent. Centrilobular and paraseptal emphysematous change. Stable biapical pleuroparenchymal scarring. Patient status post right upper lobectomy with stable postsurgical changes within the right hemithorax. Medial right lower lobe post radiation changes, similar to prior. Unchanged ground-glass nodule left apex measuring 7 mm (image 36; series 6). No pleural effusion or pneumothorax. Musculoskeletal: Thoracic spine degenerative changes. No aggressive or acute appearing osseous lesions. CT ABDOMEN PELVIS FINDINGS Hepatobiliary: Re- demonstrated nodular contour of the liver. Gallbladder is mildly enlarged. Multiple stones within the gallbladder lumen. No intrahepatic or extrahepatic biliary ductal dilatation. Pancreas: Unremarkable Spleen: Enlarged measuring 14.6 cm. Grossly unchanged 1.2 cm low-attenuation lesion in the spleen (image 67; series 2). Adrenals/Urinary Tract: Adrenal glands are normal. Kidneys enhance symmetrically with contrast. Focal atrophy involving the inferior pole of the right kidney and superior pole of the left kidney. Urinary bladder is unremarkable. Stomach/Bowel: Descending and sigmoid colonic diverticulosis. No CT evidence for acute diverticulitis. Normal appendix. No evidence for bowel obstruction. Normal morphology of the stomach. No free fluid or free intraperitoneal air. Vascular/Lymphatic: Normal caliber abdominal aorta. Peripheral calcified atherosclerotic plaque. No retroperitoneal lymphadenopathy. Reproductive: Uterus is surgically absent. Other: None. Musculoskeletal: No aggressive or acute appearing osseous lesions. Lumbar spine degenerative changes.  IMPRESSION: 1. Postsurgical changes compatible with right upper lobectomy without evidence for recurrent or metastatic disease. 2. Morphologic changes to the liver compatible with cirrhosis and splenomegaly. 3. Cholelithiasis. Electronically Signed   By: Lovey Newcomer M.D.   On: 11/23/2017 17:12   ASSESSMENT AND PLAN:  This is a very pleasant 72 years old white female with metastatic non-small cell lung cancer, adenocarcinoma with no actionable mutations status post systemic chemotherapy with carboplatin and Alimta and she is currently undergoing treatment with second line immunotherapy with Nivolumab status post 6 cycles. This was followed by immunotherapy with Nivolumab 480 mg IV every 4 weeks status post 10 cycles. The patient continues to tolerate this treatment well with no concerning complaints.  I recommended for her to proceed with cycle #11 today. She will come back for follow-up visit in 4 weeks for evaluation before starting cycle #12. She was advised to call immediately if she has any concerning symptoms in the interval. The patient voices understanding of current disease status and treatment options and is in agreement with the current care plan. All questions were answered. The patient knows to call the clinic with any problems, questions or concerns. We can certainly see the patient much sooner if necessary.  Disclaimer: This note was dictated with voice recognition software. Similar sounding words can inadvertently be transcribed and may not be corrected upon review.

## 2017-12-22 NOTE — Progress Notes (Signed)
Dr Julien Nordmann aware of bilirubin of 1.4 today. No new orders, ok to tx.

## 2017-12-25 ENCOUNTER — Other Ambulatory Visit: Payer: Self-pay | Admitting: Internal Medicine

## 2017-12-25 DIAGNOSIS — Z1231 Encounter for screening mammogram for malignant neoplasm of breast: Secondary | ICD-10-CM

## 2018-01-08 ENCOUNTER — Emergency Department (HOSPITAL_COMMUNITY)
Admission: EM | Admit: 2018-01-08 | Discharge: 2018-01-08 | Disposition: A | Discharge status: Home / Self Care | Payer: Medicare Other | Attending: Emergency Medicine | Admitting: Emergency Medicine

## 2018-01-08 ENCOUNTER — Encounter (HOSPITAL_COMMUNITY): Payer: Self-pay | Admitting: Emergency Medicine

## 2018-01-08 ENCOUNTER — Emergency Department (HOSPITAL_COMMUNITY): Payer: Medicare Other

## 2018-01-08 ENCOUNTER — Other Ambulatory Visit: Payer: Self-pay

## 2018-01-08 DIAGNOSIS — Z7901 Long term (current) use of anticoagulants: Secondary | ICD-10-CM | POA: Insufficient documentation

## 2018-01-08 DIAGNOSIS — R070 Pain in throat: Secondary | ICD-10-CM | POA: Diagnosis present

## 2018-01-08 DIAGNOSIS — Z85118 Personal history of other malignant neoplasm of bronchus and lung: Secondary | ICD-10-CM | POA: Diagnosis not present

## 2018-01-08 DIAGNOSIS — Z87891 Personal history of nicotine dependence: Secondary | ICD-10-CM | POA: Insufficient documentation

## 2018-01-08 DIAGNOSIS — J45909 Unspecified asthma, uncomplicated: Secondary | ICD-10-CM | POA: Insufficient documentation

## 2018-01-08 DIAGNOSIS — J189 Pneumonia, unspecified organism: Secondary | ICD-10-CM | POA: Diagnosis not present

## 2018-01-08 DIAGNOSIS — Z79899 Other long term (current) drug therapy: Secondary | ICD-10-CM | POA: Diagnosis not present

## 2018-01-08 DIAGNOSIS — Z950 Presence of cardiac pacemaker: Secondary | ICD-10-CM | POA: Insufficient documentation

## 2018-01-08 MED ORDER — LEVOFLOXACIN 500 MG PO TABS: 500.0000 mg | ORAL_TABLET | Freq: Every day | ORAL | 0 refills | Status: DC

## 2018-01-08 NOTE — ED Triage Notes (Signed)
Patient states she went to [facility] on 12/31/17 and diagnosed with sinusitis. States she was put on doxycycline but is no better. Complaining of sore throat, facial pain, and cough.

## 2018-01-08 NOTE — Discharge Instructions (Signed)
See your Physician for recheck on Monday.  Schedule to see Dr. Earlie Server for recheck as soon as possible

## 2018-01-08 NOTE — ED Provider Notes (Signed)
[facility] EMERGENCY DEPARTMENT Provider Note   CSN: [id number] Arrival date & time: 01/08/18  1325     History   Chief Complaint Chief Complaint  Patient presents with  . Nasal Congestion  . Sore Throat    HPI Nancy Hodges is a 72 y.o. female.  The history is provided by the patient. No language interpreter was used.  Sore Throat  This is a new problem. The current episode started more than 1 week ago. The problem occurs constantly. The problem has been gradually worsening. Nothing aggravates the symptoms. Nothing relieves the symptoms. She has tried nothing for the symptoms. The treatment provided no relief.  Pt reports she has had a cough and congestion.  Pt thought she had a sinus infection.  Pt saw her provider and was given doxycycline.  Pt reports cough has settled in her chest.  Pt reports she is coughing up some phelgm.   Past Medical History:  Diagnosis Date  . Allergic rhinitis   . Anxiety   . Arthritis   . Asthma   . Atrial flutter ([place])   . Cholelithiases 09/26/2015  . Diverticulosis   . Encounter for antineoplastic immunotherapy 12/09/2016  . GERD (gastroesophageal reflux disease)   . H/O hiatal hernia   . History of blood transfusion   . History of chemotherapy   . History of kidney stones   . History of radiation therapy   . Internal hemorrhoid   . Lung cancer ([place])    a. Stage IB non-small cell carcinoma, s/p R VATS, wedge resection, RU lobectomy 10/2012.  . Paroxysmal atrial fibrillation (HCC)    a. anticoagulated with Xarelto  . Pneumonia 06/2016   [facility]  . Presence of permanent cardiac pacemaker   . Pulmonary nodule   . Tachycardia-bradycardia syndrome ([place])    a. 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    Patient Active Problem List   Diagnosis Date Noted  . Sick sinus syndrome ([place]) 10/30/2017  . Port-A-Cath in place 07/07/2017  . Seborrheic keratosis 05/12/2017  . Nausea and vomiting 02/19/2017  . Weakness 02/19/2017  .  SIRS (systemic inflammatory response syndrome) ([place]) 02/19/2017  . Goals of care, counseling/discussion 12/09/2016  . Encounter for antineoplastic immunotherapy 12/09/2016  . Esophageal varices in other disease   . Hepatic cirrhosis ([place]) 01/09/2016  . Elevated LFTs 11/07/2015  . Cholelithiases 09/26/2015  . Melena   . GI bleed 07/29/2014  . Lung cancer, primary, with metastasis from lung to other site [facility]) 05/05/2014  . Arthralgia 10/05/2013  . Obstructive chronic bronchitis without exacerbation [place] A  02/11/2013  . Non-small cell carcinoma of right lung, stage 4 ([place]) 09/29/2012  . Tachycardia-bradycardia syndrome ([place]) 09/06/2012  . Paroxysmal atrial fibrillation [facility])     Past Surgical History:  Procedure Laterality Date  . CATARACT EXTRACTION W/PHACO Right 08/14/2016   Procedure: CATARACT EXTRACTION PHACO AND INTRAOCULAR LENS PLACEMENT RIGHT EYE CDE 10.53;  Surgeon: Tonny Branch, MD;  Location: [facility];  Service: Ophthalmology;  Laterality: Right;  right  . CATARACT EXTRACTION W/PHACO Left 09/25/2016   Procedure: CATARACT EXTRACTION PHACO AND INTRAOCULAR LENS PLACEMENT (IOC);  Surgeon: Tonny Branch, MD;  Location: [facility];  Service: Ophthalmology;  Laterality: Left;  left CDE 8.81  . COLONOSCOPY     [facility]: cattered sigmoid diverticula, internal hemorrhoids, sessile polyp in ascending and mid transverse colon (tubular adenoma).   . colonoscopy with polypectomy  2015  . ESOPHAGOGASTRODUODENOSCOPY     [facility]: bile reflux in stomach, mild gastritis, hiatal  hernia  . ESOPHAGOGASTRODUODENOSCOPY N/A 01/31/2016   Procedure: ESOPHAGOGASTRODUODENOSCOPY (EGD);  Surgeon: [name], MD;  [facility];  Service: Endoscopy;  Laterality: N/A;  1200  . IR FLUORO GUIDE PORT INSERTION RIGHT  06/30/2017  . IR US GUIDE VASC ACCESS RIGHT  06/30/2017  . LOBECTOMY Right 10/27/2012   Procedure: LOBECTOMY;  Surgeon: [name], MD;  Location: [place];  Service: Thoracic;   Laterality: Right;   RIGHT UPPER LOBECTOMY & Node Dissection  . LYMPH NODE BIOPSY Right 05/01/2014   Procedure: LYMPH NODE BIOPSY, Right Axillary;  Surgeon: [name], MD;  Location: [place];  Service: Thoracic;  Laterality: Right;  . PACEMAKER IMPLANT N/A 10/30/2017   Procedure: PACEMAKER IMPLANT;  Surgeon: [name], MD;  Location: [place] CV LAB;  Service: Cardiovascular;  Laterality: N/A;  . PARTIAL HYSTERECTOMY    . PERMANENT PACEMAKER INSERTION N/A 09/14/2012   Nanostim (SJM) leadless pacemaker (LEADLESS II STUDY PATIENT) implanted by Dr [name]  . VIDEO ASSISTED THORACOSCOPY (VATS)/WEDGE RESECTION Right 10/27/2012   Procedure: VIDEO ASSISTED THORACOSCOPY (VATS),RGHT UPPER LOBE LUNG WEDGE RESECTION;  Surgeon: [name], MD;  [facility];  Service: Thoracic;  Laterality: Right;     OB History    Gravida  1   Para  1   Term  1   Preterm      AB      Living        SAB      TAB      Ectopic      Multiple      Live Births               Home Medications    Prior to Admission medications   Medication Sig Start Date End Date Taking? Authorizing Provider  acetaminophen (TYLENOL) 500 MG tablet Take 500 mg by mouth every 6 (six) hours as needed for moderate pain or fever.     [provider]  albuterol (PROVENTIL HFA;VENTOLIN HFA) 108 (90 BASE) MCG/ACT inhaler Inhale 1-2 puffs into the lungs every 6 (six) hours as needed for wheezing or shortness of breath.    [provider]  diltiazem (CARDIZEM CD) 240 MG 24 hr capsule Take 1 capsule (240 mg total) by mouth daily. 03/30/17   Allred, Jeneen Rinks, MD  hydroxypropyl methylcellulose / hypromellose (ISOPTO TEARS / GONIOVISC) 2.5 % ophthalmic solution Place 1-2 drops into the left eye as needed for dry eyes.    [provider]  ipratropium-albuterol (DUONEB) 0.5-2.5 (3) MG/3ML SOLN Take 3 mLs by nebulization every 6 (six) hours as needed (shortness of breath or wheezing).      [provider]  levofloxacin (LEVAQUIN) 500 MG tablet Take 1 tablet (500 mg total) by mouth daily. 01/08/18   Fransico Meadow, PA-C  lidocaine-prilocaine (EMLA) cream Apply 1 application topically as needed. Patient taking differently: Apply 1 application topically as needed (for port).  07/03/17   Curt Bears, MD  loratadine (CLARITIN) 10 MG tablet Take 10 mg by mouth daily.    [provider]  meclizine (ANTIVERT) 12.5 MG tablet Take 12.5 mg by mouth 3 (three) times daily as needed for dizziness. Takes 1/2 tablet tid as needed.    [provider]  Multiple Vitamin (MULTIVITAMIN) tablet Take 1 tablet by mouth daily.    [provider]  ondansetron (ZOFRAN ODT) 4 MG disintegrating tablet Take 1 tablet (4 mg total) by mouth every 8 (eight) hours as needed for nausea  or vomiting. Patient not taking: Reported on 12/22/2017 09/13/17   Francine Graven, DO  rivaroxaban (XARELTO) 20 MG TABS tablet Take 1 tablet (20 mg total) by mouth daily with supper. 03/30/17   [name], MD    Family History Family History  Problem Relation Age of Onset  . Heart disease Mother   . Diabetes Mother   . Kidney cancer Mother   . Asthma Father   . Heart disease Father   . Pancreatic cancer Brother   . Diabetes Sister   . Lung cancer Sister   . Diabetes Brother   . Heart attack Son   . Colon cancer Neg Hx     Social History Social History   Tobacco Use  . Smoking status: Former Smoker    Packs/day: 2.00    Years: 40.00    Pack years: 80.00    Types: Cigarettes    Last attempt to quit: 08/05/1995    Years since quitting: 22.4  . Smokeless tobacco: Never Used  Substance Use Topics  . Alcohol use: No  . Drug use: No     Allergies   Chlorpheniramine-dm; Prednisone; Septra [sulfamethoxazole-trimethoprim]; Penicillins; and Vioxx [rofecoxib]   Review of Systems Review of Systems  All other systems reviewed and are negative.    Physical Exam Updated  Vital Signs BP (!) 142/65 (BP Location: Left Arm)   Pulse 79   Temp 98.2 F (36.8 C) (Oral)   Resp 16   Ht 5\' 2"  (1.575 m)   Wt 69.4 kg (153 lb)   SpO2 95%   BMI 27.98 kg/m   Physical Exam  Constitutional: She is oriented to person, place, and time. She appears well-developed and well-nourished.  HENT:  Head: Normocephalic.  Right Ear: Tympanic membrane normal.  Left Ear: Tympanic membrane normal.  Mouth/Throat: Oropharynx is clear and moist and mucous membranes are normal.  Eyes: Pupils are equal, round, and reactive to light. EOM are normal.  Neck: Normal range of motion.  Pulmonary/Chest: Effort normal.  Abdominal: She exhibits no distension.  Musculoskeletal: Normal range of motion.  Neurological: She is alert and oriented to person, place, and time.  Skin: Skin is warm.  Psychiatric: She has a normal mood and affect.  Nursing note and vitals reviewed.    ED Treatments / Results  Labs (all labs ordered are listed, but only abnormal results are displayed) Labs Reviewed - No data to display  EKG None  Radiology Dg Chest 2 View  Result Date: 01/08/2018 CLINICAL DATA:  Cough EXAM: CHEST - 2 VIEW COMPARISON:  10/31/2017 chest radiograph. FINDINGS: Stable configuration of 2 lead left subclavian pacemaker, right internal jugular MediPort and lead less pacer overlying the heart. Surgical clips overlie the right axilla. Stable cardiomediastinal silhouette with normal heart size. No pneumothorax. Stable chronic pleural-parenchymal scarring at the right costophrenic angle. No pleural effusions. New patchy consolidation in the lingula. IMPRESSION: New patchy consolidation in the lingula, suggesting a lingular pneumonia. Recommend follow-up PA and lateral post treatment chest radiographs in 4-6 weeks. Electronically Signed   By: Ilona Sorrel M.D.   On: 01/08/2018 14:32    Procedures Procedures (including critical care time)  Medications Ordered in ED Medications - No data to  display   Initial Impression / Assessment and Plan / ED Course  I have reviewed the triage vital signs and the nursing notes.  Pertinent labs & imaging results that were available during my care of the patient were reviewed by me and considered in my medical  decision making (see chart for details).     I discussed treatment with pharmacist due to allergies and recent doxycycline.  Recent renal functions normal.  Pharmacy advised Levaquin 500mg    Final Clinical Impressions(s) / ED Diagnoses   Final diagnoses:  Community acquired pneumonia, unspecified laterality    ED Discharge Orders        Ordered    levofloxacin (LEVAQUIN) 500 MG tablet  Daily     01/08/18 1508    An After Visit Summary was printed and given to the patient.   Fransico Meadow, Hershal Coria 01/08/18 [place]    Noemi Chapel, MD 01/09/18 2030

## 2018-01-12 ENCOUNTER — Telehealth: Payer: Self-pay | Admitting: Medical Oncology

## 2018-01-12 NOTE — Telephone Encounter (Signed)
Recent Pnemonia-tx as outpt -2 more days of levaquin.I told her to keep  Her appt next Tuesday.

## 2018-01-18 ENCOUNTER — Other Ambulatory Visit: Payer: Self-pay | Admitting: Medical Oncology

## 2018-01-18 DIAGNOSIS — C3491 Malignant neoplasm of unspecified part of right bronchus or lung: Secondary | ICD-10-CM

## 2018-01-19 ENCOUNTER — Encounter: Payer: Self-pay | Admitting: Internal Medicine

## 2018-01-19 ENCOUNTER — Telehealth: Payer: Self-pay | Admitting: Internal Medicine

## 2018-01-19 ENCOUNTER — Inpatient Hospital Stay: Payer: Medicare Other

## 2018-01-19 ENCOUNTER — Inpatient Hospital Stay (HOSPITAL_BASED_OUTPATIENT_CLINIC_OR_DEPARTMENT_OTHER): Payer: Medicare Other | Admitting: Internal Medicine

## 2018-01-19 ENCOUNTER — Encounter: Payer: Self-pay | Admitting: *Deleted

## 2018-01-19 ENCOUNTER — Inpatient Hospital Stay: Payer: Medicare Other | Attending: Internal Medicine

## 2018-01-19 DIAGNOSIS — Z5112 Encounter for antineoplastic immunotherapy: Secondary | ICD-10-CM | POA: Diagnosis present

## 2018-01-19 DIAGNOSIS — J42 Unspecified chronic bronchitis: Secondary | ICD-10-CM | POA: Diagnosis not present

## 2018-01-19 DIAGNOSIS — C773 Secondary and unspecified malignant neoplasm of axilla and upper limb lymph nodes: Secondary | ICD-10-CM | POA: Insufficient documentation

## 2018-01-19 DIAGNOSIS — C3411 Malignant neoplasm of upper lobe, right bronchus or lung: Secondary | ICD-10-CM | POA: Diagnosis present

## 2018-01-19 DIAGNOSIS — C3491 Malignant neoplasm of unspecified part of right bronchus or lung: Secondary | ICD-10-CM

## 2018-01-19 DIAGNOSIS — Z95828 Presence of other vascular implants and grafts: Secondary | ICD-10-CM

## 2018-01-19 DIAGNOSIS — R0982 Postnasal drip: Secondary | ICD-10-CM | POA: Diagnosis not present

## 2018-01-19 DIAGNOSIS — C3490 Malignant neoplasm of unspecified part of unspecified bronchus or lung: Secondary | ICD-10-CM

## 2018-01-19 LAB — CBC WITH DIFFERENTIAL (CANCER CENTER ONLY)
BASOS PCT: 1 %
Basophils Absolute: 0 10*3/uL (ref 0.0–0.1)
EOS PCT: 1 %
Eosinophils Absolute: 0.1 10*3/uL (ref 0.0–0.5)
HEMATOCRIT: 38.2 % (ref 34.8–46.6)
Hemoglobin: 13 g/dL (ref 11.6–15.9)
Lymphocytes Relative: 21 %
Lymphs Abs: 0.9 10*3/uL (ref 0.9–3.3)
MCH: 32.7 pg (ref 25.1–34.0)
MCHC: 34 g/dL (ref 31.5–36.0)
MCV: 96.1 fL (ref 79.5–101.0)
MONO ABS: 0.4 10*3/uL (ref 0.1–0.9)
MONOS PCT: 9 %
NEUTROS ABS: 3.1 10*3/uL (ref 1.5–6.5)
Neutrophils Relative %: 68 %
Platelet Count: 207 10*3/uL (ref 145–400)
RBC: 3.97 MIL/uL (ref 3.70–5.45)
RDW: 13.6 % (ref 11.2–14.5)
WBC Count: 4.5 10*3/uL (ref 3.9–10.3)

## 2018-01-19 LAB — CMP (CANCER CENTER ONLY)
ALK PHOS: 97 U/L (ref 40–150)
ALT: 20 U/L (ref 0–55)
AST: 27 U/L (ref 5–34)
Albumin: 3.8 g/dL (ref 3.5–5.0)
Anion gap: 6 (ref 3–11)
BUN: 12 mg/dL (ref 7–26)
CALCIUM: 9.8 mg/dL (ref 8.4–10.4)
CO2: 29 mmol/L (ref 22–29)
Chloride: 105 mmol/L (ref 98–109)
Creatinine: 0.78 mg/dL (ref 0.60–1.10)
GFR, Est AFR Am: >60 mL/min (ref >60)
GFR, Estimated: >60 mL/min (ref >60)
GLUCOSE: 149 mg/dL — AB (ref 70–140)
Potassium: 4.3 mmol/L (ref 3.5–5.1)
SODIUM: 140 mmol/L (ref 136–145)
Total Bilirubin: 0.8 mg/dL (ref 0.2–1.2)
Total Protein: 6.8 g/dL (ref 6.4–8.3)

## 2018-01-19 MED ORDER — SODIUM CHLORIDE 0.9 % IV SOLN
Freq: Once | INTRAVENOUS | Status: AC
  Administered 2018-01-19: 12:00:00 via INTRAVENOUS

## 2018-01-19 MED ORDER — SODIUM CHLORIDE 0.9% FLUSH
10.0000 mL | INTRAVENOUS | Status: DC | PRN
  Administered 2018-01-19: 10 mL via INTRAVENOUS
  Filled 2018-01-19: qty 10

## 2018-01-19 MED ORDER — SODIUM CHLORIDE 0.9% FLUSH
10.0000 mL | INTRAVENOUS | Status: DC | PRN
  Administered 2018-01-19: 10 mL
  Filled 2018-01-19: qty 10

## 2018-01-19 MED ORDER — AZITHROMYCIN 250 MG PO TABS: ORAL_TABLET | ORAL | 0 refills | Status: DC

## 2018-01-19 MED ORDER — SODIUM CHLORIDE 0.9 % IV SOLN
480.0000 mg | Freq: Once | INTRAVENOUS | Status: AC
  Administered 2018-01-19: 480 mg via INTRAVENOUS
  Filled 2018-01-19: qty 48

## 2018-01-19 MED ORDER — HEPARIN SOD (PORK) LOCK FLUSH 100 UNIT/ML IV SOLN
500.0000 [IU] | Freq: Once | INTRAVENOUS | Status: AC | PRN
  Administered 2018-01-19: 500 [IU]
  Filled 2018-01-19: qty 5

## 2018-01-19 NOTE — Telephone Encounter (Signed)
Appointments scheduled AVS/Calendar printed per 6/18 los

## 2018-01-19 NOTE — Progress Notes (Signed)
Oncology Nurse Navigator Documentation  Oncology Nurse Navigator Flowsheets 01/19/2018  Navigator Location [facility]  Navigator Encounter Type Clinic/MDC/I spoke with patient today at [facility].  She is not feeling well and has persistent cough with hoarseness.  Dr. Julien Nordmann updated and is to get antibiotic and cough medication. I help educate on medications.  I offered support and listened to her and how she is handling treatment.  She is doing well with treatment regimen and had no questions at this time.    Treatment Phase Treatment  Barriers/Navigation Needs Education  Education Other  Interventions Education;Other  Education Method Verbal  Acuity Level 2  Time Spent with Patient 62

## 2018-01-19 NOTE — Patient Instructions (Signed)
[facility] Discharge Instructions for Patients Receiving Chemotherapy  Today you received the following chemotherapy agents :  Opdivo.  To help prevent nausea and vomiting after your treatment, we encourage you to take your nausea medication as prescribed.   If you develop nausea and vomiting that is not controlled by your nausea medication, call the clinic.   BELOW ARE SYMPTOMS THAT SHOULD BE REPORTED IMMEDIATELY:  *FEVER GREATER THAN 100.5 F  *CHILLS WITH OR WITHOUT FEVER  NAUSEA AND VOMITING THAT IS NOT CONTROLLED WITH YOUR NAUSEA MEDICATION  *UNUSUAL SHORTNESS OF BREATH  *UNUSUAL BRUISING OR BLEEDING  TENDERNESS IN MOUTH AND THROAT WITH OR WITHOUT PRESENCE OF ULCERS  *URINARY PROBLEMS  *BOWEL PROBLEMS  UNUSUAL RASH Items with * indicate a potential emergency and should be followed up as soon as possible.  Feel free to call the clinic should you have any questions or concerns. The clinic phone number is [phone].  Please show the CHEMO ALERT CARD at check-in to the Emergency Department and triage nurse.   

## 2018-01-19 NOTE — Progress Notes (Signed)
[place] [phone]   [phone]  OFFICE PROGRESS NOTE  Harris, [name], [name] [address] [place] [address]  DIAGNOSIS: Metastatic non-small cell lung cancer, adenocarcinoma. This was initially diagnosed as stage IB (T2a, N0, M0) in March of 2014, status post right upper lobectomy with lymph node dissection but the patient has evidence for disease recurrence in the right axilla status post resection.  PRIOR THERAPY:  1) Status post right axillary lymph node biopsy on 05/01/2014 under the care of Dr. Roxan Hockey. 2) Systemic chemotherapy with carboplatin for AUC of 5 and Alimta 500 mg/M2 every 3 weeks. First cycle 05/31/2014. Status post 6 cycles. Last dose was given 09/22/2014. 3) palliative radiotherapy to the subcarinal lymph node under the care of Dr. Sondra Come. 4) Nivolumab 240 mg IV every 2 weeks. First dose 12/23/2016. Status post 6 cycles.  CURRENT THERAPY:  Nivolumab 480 mg IV every 4 weeks. First dose 03/17/2017. Status post 11 cycles.  INTERVAL HISTORY: Nancy Hodges 72 y.o. female returns to the clinic today for follow-up visit.  The patient is feeling fine today with no specific complaints except for hoarseness of her voice and postnasal drainage.  She also has mild cough.  She denied having any chest pain, shortness of breath or hemoptysis.  She denied having any recent weight loss or night sweats.  She has no nausea, vomiting, diarrhea or constipation.  She continues to tolerate her treatment with Nivolumab fairly well.  She is here today for evaluation before starting cycle #12.   MEDICAL HISTORY: Past Medical History:  Diagnosis Date  . Allergic rhinitis   . Anxiety   . Arthritis   . Asthma   . Atrial flutter ([place])   . Cholelithiases 09/26/2015  . Diverticulosis   . Encounter for antineoplastic immunotherapy 12/09/2016  . GERD (gastroesophageal reflux disease)   . H/O hiatal hernia   . History of blood  transfusion   . History of chemotherapy   . History of kidney stones   . History of radiation therapy   . Internal hemorrhoid   . Lung cancer ([place])    a. Stage IB non-small cell carcinoma, s/p R VATS, wedge resection, RU lobectomy 10/2012.  . Paroxysmal atrial fibrillation ([facility])    a. anticoagulated with Xarelto  . Pneumonia 06/2016   [facility]  . Presence of permanent cardiac pacemaker   . Pulmonary nodule   . Tachycardia-bradycardia syndrome ([place])    a. 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    ALLERGIES:  is allergic to chlorpheniramine-dm; prednisone; septra [sulfamethoxazole-trimethoprim]; penicillins; and vioxx [rofecoxib].  MEDICATIONS:  Current Outpatient Medications  Medication Sig Dispense Refill  . acetaminophen (TYLENOL) 500 MG tablet Take 500 mg by mouth every 6 (six) hours as needed for moderate pain or fever.     [name] albuterol (PROVENTIL HFA;VENTOLIN HFA) 108 (90 BASE) MCG/ACT inhaler Inhale 1-2 puffs into the lungs every 6 (six) hours as needed for wheezing or shortness of breath.    . diltiazem (CARDIZEM CD) 240 MG 24 hr capsule Take 1 capsule (240 mg total) by mouth daily. 90 capsule 3  . hydroxypropyl methylcellulose / hypromellose (ISOPTO TEARS / GONIOVISC) 2.5 % ophthalmic solution Place 1-2 drops into the left eye as needed for dry eyes.    [name] ipratropium-albuterol (DUONEB) 0.5-2.5 (3) MG/3ML SOLN Take 3 mLs by nebulization every 6 (six) hours as needed (shortness of breath or wheezing).     [name] levofloxacin (LEVAQUIN) 500 MG  tablet Take 1 tablet (500 mg total) by mouth daily. 10 tablet 0  . lidocaine-prilocaine (EMLA) cream Apply 1 application topically as needed. (Patient taking differently: Apply 1 application topically as needed (for port). ) 30 g 4  . loratadine (CLARITIN) 10 MG tablet Take 10 mg by mouth daily.    . meclizine (ANTIVERT) 12.5 MG tablet Take 12.5 mg by mouth 3 (three) times daily as needed for dizziness. Takes 1/2 tablet tid as needed.    .  Multiple Vitamin (MULTIVITAMIN) tablet Take 1 tablet by mouth daily.    . ondansetron (ZOFRAN ODT) 4 MG disintegrating tablet Take 1 tablet (4 mg total) by mouth every 8 (eight) hours as needed for nausea or vomiting. (Patient not taking: Reported on 12/22/2017) 6 tablet 0  . rivaroxaban (XARELTO) 20 MG TABS tablet Take 1 tablet (20 mg total) by mouth daily with supper. 30 tablet 11   No current facility-administered medications for this visit.     SURGICAL HISTORY:  Past Surgical History:  Procedure Laterality Date  . CATARACT EXTRACTION W/PHACO Right 08/14/2016   Procedure: CATARACT EXTRACTION PHACO AND INTRAOCULAR LENS PLACEMENT RIGHT EYE CDE 10.53;  Surgeon: Tonny Branch, MD;  [facility];  Service: Ophthalmology;  Laterality: Right;  right  . CATARACT EXTRACTION W/PHACO Left 09/25/2016   Procedure: CATARACT EXTRACTION PHACO AND INTRAOCULAR LENS PLACEMENT (IOC);  Surgeon: Tonny Branch, MD;  Location: [facility];  Service: Ophthalmology;  Laterality: Left;  left CDE 8.81  . COLONOSCOPY     [facility]: cattered sigmoid diverticula, internal hemorrhoids, sessile polyp in ascending and mid transverse colon (tubular adenoma).   . colonoscopy with polypectomy  2015  . ESOPHAGOGASTRODUODENOSCOPY     [facility]: bile reflux in stomach, mild gastritis, hiatal hernia  . ESOPHAGOGASTRODUODENOSCOPY N/A 01/31/2016   Procedure: ESOPHAGOGASTRODUODENOSCOPY (EGD);  Surgeon: [name], MD;  [facility];  Service: Endoscopy;  Laterality: N/A;  1200  . IR FLUORO GUIDE PORT INSERTION RIGHT  06/30/2017  . IR US GUIDE VASC ACCESS RIGHT  06/30/2017  . LOBECTOMY Right 10/27/2012   Procedure: LOBECTOMY;  Surgeon: [name], MD;  Location: [place];  Service: Thoracic;  Laterality: Right;   RIGHT UPPER LOBECTOMY & Node Dissection  . LYMPH NODE BIOPSY Right 05/01/2014   Procedure: LYMPH NODE BIOPSY, Right Axillary;  Surgeon: [name], MD;  Location: [place];  Service: Thoracic;  Laterality:  Right;  . PACEMAKER IMPLANT N/A 10/30/2017   Procedure: PACEMAKER IMPLANT;  Surgeon: [name], MD;  Location: [place] CV LAB;  Service: Cardiovascular;  Laterality: N/A;  . PARTIAL HYSTERECTOMY    . PERMANENT PACEMAKER INSERTION N/A 09/14/2012   Nanostim (SJM) leadless pacemaker (LEADLESS II STUDY PATIENT) implanted by Dr [name]  . VIDEO ASSISTED THORACOSCOPY (VATS)/WEDGE RESECTION Right 10/27/2012   Procedure: VIDEO ASSISTED THORACOSCOPY (VATS),RGHT UPPER LOBE LUNG WEDGE RESECTION;  Surgeon: [name], MD;  Location: [place];  Service: Thoracic;  Laterality: Right;    REVIEW OF SYSTEMS:  A comprehensive review of systems was negative except for: Ears, nose, mouth, throat, and face: positive for hoarseness Respiratory: positive for chronic bronchitis and cough   PHYSICAL EXAMINATION: General appearance: alert, cooperative and no distress Head: Normocephalic, without obvious abnormality, atraumatic Neck: no adenopathy, no JVD, supple, symmetrical, trachea midline and thyroid not enlarged, symmetric, no tenderness/mass/nodules Lymph nodes: Cervical, supraclavicular, and axillary nodes normal. Resp: clear to auscultation bilaterally Back: symmetric, no curvature. ROM normal. No CVA tenderness. Cardio: regular rate and rhythm, S1, S2  normal, no murmur, click, rub or gallop GI: soft, non-tender; bowel sounds normal; no masses,  no organomegaly Extremities: extremities normal, atraumatic, no cyanosis or edema  ECOG PERFORMANCE STATUS: 1 - Symptomatic but completely ambulatory  Blood pressure 116/70, pulse 73, temperature 98.5 F (36.9 C), temperature source Oral, resp. rate 17, height 5\' 2"  (1.575 m), weight 149 lb 9.6 oz (67.9 kg), SpO2 96 %.  LABORATORY DATA: Lab Results  Component Value Date   WBC 4.5 01/19/2018   HGB 13.0 01/19/2018   HCT 38.2 01/19/2018   MCV 96.1 01/19/2018   PLT 207 01/19/2018      Chemistry      Component Value Date/Time   NA 140 01/19/2018  1014   NA 139 08/05/2017 0902   K 4.3 01/19/2018 1014   K 4.1 08/05/2017 0902   CL 105 01/19/2018 1014   CO2 29 01/19/2018 1014   CO2 28 08/05/2017 0902   BUN 12 01/19/2018 1014   BUN 10.1 08/05/2017 0902   CREATININE 0.78 01/19/2018 1014   CREATININE 0.8 08/05/2017 0902   GLU 134 (H) 02/18/2017 1410      Component Value Date/Time   CALCIUM 9.8 01/19/2018 1014   CALCIUM 9.5 08/05/2017 0902   ALKPHOS 97 01/19/2018 1014   ALKPHOS 111 08/05/2017 0902   AST 27 01/19/2018 1014   AST 33 08/05/2017 0902   ALT 20 01/19/2018 1014   ALT 34 08/05/2017 0902   BILITOT 0.8 01/19/2018 1014   BILITOT 1.39 (H) 08/05/2017 0902       RADIOGRAPHIC STUDIES: Dg Chest 2 View  Result Date: 01/08/2018 CLINICAL DATA:  Cough EXAM: CHEST - 2 VIEW COMPARISON:  10/31/2017 chest radiograph. FINDINGS: Stable configuration of 2 lead left subclavian pacemaker, right internal jugular MediPort and lead less pacer overlying the heart. Surgical clips overlie the right axilla. Stable cardiomediastinal silhouette with normal heart size. No pneumothorax. Stable chronic pleural-parenchymal scarring at the right costophrenic angle. No pleural effusions. New patchy consolidation in the lingula. IMPRESSION: New patchy consolidation in the lingula, suggesting a lingular pneumonia. Recommend follow-up PA and lateral post treatment chest radiographs in 4-6 weeks. Electronically Signed   By: Ilona Sorrel M.D.   On: 01/08/2018 14:32   ASSESSMENT AND PLAN:  This is a very pleasant 72 years old white female with metastatic non-small cell lung cancer, adenocarcinoma with no actionable mutations status post systemic chemotherapy with carboplatin and Alimta and she is currently undergoing treatment with second line immunotherapy with Nivolumab status post 6 cycles. This was followed by immunotherapy with Nivolumab 480 mg IV every 4 weeks status post 11 cycles. The patient continues to tolerate her treatment well with no concerning  complaints. I recommended for her to proceed with cycle #12 today as a schedule. I will see her back for follow-up visit in 4 weeks for evaluation with repeat CT scan of the chest, abdomen and pelvis for restaging of her disease. For the postnasal drainage and chronic bronchitis, I will start the patient on Z-Pak and she was advised to continue her current treatment with Claritin. The patient was advised to call immediately if she has any concerning symptoms in the interval. The patient voices understanding of current disease status and treatment options and is in agreement with the current care plan. All questions were answered. The patient knows to call the clinic with any problems, questions or concerns. We can certainly see the patient much sooner if necessary.  Disclaimer: This note was dictated with voice recognition software. Similar  sounding words can inadvertently be transcribed and may not be corrected upon review.

## 2018-01-20 ENCOUNTER — Ambulatory Visit: Payer: Medicare Other

## 2018-02-01 ENCOUNTER — Ambulatory Visit (INDEPENDENT_AMBULATORY_CARE_PROVIDER_SITE_OTHER): Payer: Medicare Other | Admitting: Internal Medicine

## 2018-02-01 ENCOUNTER — Telehealth: Payer: Self-pay | Admitting: Internal Medicine

## 2018-02-01 ENCOUNTER — Encounter: Payer: Self-pay | Admitting: Internal Medicine

## 2018-02-01 DIAGNOSIS — I495 Sick sinus syndrome: Secondary | ICD-10-CM | POA: Diagnosis not present

## 2018-02-01 DIAGNOSIS — I480 Paroxysmal atrial fibrillation: Secondary | ICD-10-CM

## 2018-02-01 LAB — CUP PACEART INCLINIC DEVICE CHECK
Battery Remaining Longevity: 134 mo
Date Time Interrogation Session: 20190701120525
Implantable Lead Implant Date: 20190329
Implantable Lead Location: 753859
Implantable Pulse Generator Implant Date: 20190329
Lead Channel Pacing Threshold Amplitude: 0.75 V
Lead Channel Pacing Threshold Pulse Width: 0.5 ms
Lead Channel Setting Pacing Amplitude: 2 V
Lead Channel Setting Pacing Amplitude: 2.5 V
Lead Channel Setting Pacing Pulse Width: 0.5 ms
Lead Channel Setting Sensing Sensitivity: 2 mV
MDC IDC LEAD IMPLANT DT: 20190329
MDC IDC LEAD LOCATION: 753860
MDC IDC MSMT BATTERY VOLTAGE: 3.02 V
MDC IDC MSMT LEADCHNL RA IMPEDANCE VALUE: 450 Ohm
MDC IDC MSMT LEADCHNL RA SENSING INTR AMPL: 4 mV
MDC IDC MSMT LEADCHNL RV IMPEDANCE VALUE: 612.5 Ohm
MDC IDC MSMT LEADCHNL RV PACING THRESHOLD AMPLITUDE: 0.75 V
MDC IDC MSMT LEADCHNL RV PACING THRESHOLD PULSEWIDTH: 0.5 ms
MDC IDC MSMT LEADCHNL RV SENSING INTR AMPL: 12 mV
MDC IDC STAT BRADY RA PERCENT PACED: 0.28 %
MDC IDC STAT BRADY RV PERCENT PACED: 16 %
Pulse Gen Model: 2272
Pulse Gen Serial Number: 9003317

## 2018-02-01 NOTE — Patient Instructions (Addendum)
Medication Instructions:  Your physician recommends that you continue on your current medications as directed. Please refer to the Current Medication list given to you today.  *If you need a refill on your cardiac medications before your next appointment, please call your pharmacy*  Labwork: None ordered  Testing/Procedures: Your physician has recommended that you have a Cardioversion (DCCV). Electrical Cardioversion uses a jolt of electricity to your heart either through paddles or wired patches attached to your chest. This is a controlled, usually prescheduled, procedure. Defibrillation is done under light anesthesia in the hospital, and you usually go home the day of the procedure. This is done to get your heart back into a normal rhythm. You are not awake for the procedure.   CARDIOVERSION INSTRUCTIONS You are scheduled for a cardioversion on _____ at ______ with Dr. ______ or associates. Please go to [employer], Entrance "A" at [facility]  at _____.  Do not have any food or drink after midnight on _______.  You may take your medicines with a sip of water on the day of your procedure.  You will need someone to drive you home following your procedure.   Call the [place] office at [phone] if you have any questions, problems or concerns.    Follow-Up: Your physician recommends that you schedule a follow-up appointment in: 4 weeks with Roderic Palau, NP in the AFib clinic.  Your physician recommends that you schedule a follow-up appointment in: 3 months with Dr. [name] in the [address] office.  Remote monitoring is used to monitor your Pacemaker or ICD from home. This monitoring reduces the number of office visits required to check your device to one time per year. It allows [place] to keep an eye on the functioning of your device to ensure it is working properly. You are scheduled for a device check from home on 05/03/2018. You may send your transmission  at any time that day. If you have a wireless device, the transmission will be sent automatically. After your physician reviews your transmission, you will receive a postcard with your next transmission date.    Thank you for choosing [facility]!!    Any Other Special Instructions Will Be Listed Below (If Applicable).   Electrical Cardioversion Electrical cardioversion is the delivery of a jolt of electricity to change the rhythm of the heart. Sticky patches or metal paddles are placed on the chest to deliver the electricity from a device. This is done to restore a normal rhythm. A rhythm that is too fast or not regular keeps the heart from pumping well. Electrical cardioversion is done in an emergency if:   There is low or no blood pressure as a result of the heart rhythm.    Normal rhythm must be restored as fast as possible to protect the brain and heart from further damage.    It may save a life. Cardioversion may be done for heart rhythms that are not immediately life threatening, such as atrial fibrillation or flutter, in which:   The heart is beating too fast or is not regular.    Medicine to change the rhythm has not worked.    It is safe to wait in order to allow time for preparation.  Symptoms of the abnormal rhythm are bothersome.  The risk of stroke and other serious problems can be reduced.  LET [facility] CARE PROVIDER KNOW ABOUT:   Any allergies you have.  All medicines you are taking, including  vitamins, herbs, eye drops, creams, and over-the-counter medicines.  Previous problems you or members of your family have had with the use of anesthetics.    Any blood disorders you have.    Previous surgeries you have had.    Medical conditions you have.   RISKS AND COMPLICATIONS  Generally, this is a safe procedure. However, problems can occur and include:   Breathing problems related to the anesthetic used.  A blood clot that breaks free and travels to  other parts of your body. This could cause a stroke or other problems. The risk of this is lowered by use of blood-thinning medicine (anticoagulant) prior to the procedure.  Cardiac arrest (rare).   BEFORE THE PROCEDURE   You may have tests to detect blood clots in your heart and to evaluate heart function.   You may start taking anticoagulants so your blood does not clot as easily.    Medicines may be given to help stabilize your heart rate and rhythm.   PROCEDURE  You will be given medicine through an IV tube to reduce discomfort and make you sleepy (sedative).    An electrical shock will be delivered.   AFTER THE PROCEDURE Your heart rhythm will be watched to make sure it does not change. You will need someone to drive you home.

## 2018-02-01 NOTE — H&P (View-Only) (Signed)
PCP: [name], [name] Primary Cardiologist: Dr Irish Lack Primary EP:  Dr Lillette Boxer Nancy Hodges is a 72 y.o. female who presents today for routine electrophysiology followup.  Since her recent PPM implant, the patient reports doing very well.  She remains in afib.  + symptoms of palpitations and fatigue. Today, she denies symptoms of chest pain, shortness of breath,  lower extremity edema, dizziness, presyncope, or syncope.  The patient is otherwise without complaint today.   Past Medical History:  Diagnosis Date  . Allergic rhinitis   . Anxiety   . Arthritis   . Asthma   . Atrial flutter ([place])   . Cholelithiases 09/26/2015  . Diverticulosis   . Encounter for antineoplastic immunotherapy 12/09/2016  . GERD (gastroesophageal reflux disease)   . H/O hiatal hernia   . History of blood transfusion   . History of chemotherapy   . History of kidney stones   . History of radiation therapy   . Internal hemorrhoid   . Lung cancer ([place])    a. Stage IB non-small cell carcinoma, s/p R VATS, wedge resection, RU lobectomy 10/2012.  . Paroxysmal atrial fibrillation (HCC)    a. anticoagulated with Xarelto  . Pneumonia 06/2016   [facility]  . Presence of permanent cardiac pacemaker   . Pulmonary nodule   . Tachycardia-bradycardia syndrome ([place])    a. 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   Past Surgical History:  Procedure Laterality Date  . CATARACT EXTRACTION W/PHACO Right 08/14/2016   Procedure: CATARACT EXTRACTION PHACO AND INTRAOCULAR LENS PLACEMENT RIGHT EYE CDE 10.53;  Surgeon: Tonny Branch, MD;  [facility]: [facility];  Service: Ophthalmology;  Laterality: Right;  right  . CATARACT EXTRACTION W/PHACO Left 09/25/2016   Procedure: CATARACT EXTRACTION PHACO AND INTRAOCULAR LENS PLACEMENT (IOC);  Surgeon: Tonny Branch, MD;  Location: [facility];  Service: Ophthalmology;  Laterality: Left;  left CDE 8.81  . COLONOSCOPY     [facility]: cattered sigmoid diverticula, internal hemorrhoids,  sessile polyp in ascending and mid transverse colon (tubular adenoma).   . colonoscopy with polypectomy  2015  . ESOPHAGOGASTRODUODENOSCOPY     [facility]: bile reflux in stomach, mild gastritis, hiatal hernia  . ESOPHAGOGASTRODUODENOSCOPY N/A 01/31/2016   Procedure: ESOPHAGOGASTRODUODENOSCOPY (EGD);  Surgeon: [name], MD;  [facility];  Service: Endoscopy;  Laterality: N/A;  1200  . IR FLUORO GUIDE PORT INSERTION RIGHT  06/30/2017  . IR US GUIDE VASC ACCESS RIGHT  06/30/2017  . LOBECTOMY Right 10/27/2012   Procedure: LOBECTOMY;  Surgeon: [name], MD;  Location: [place];  Service: Thoracic;  Laterality: Right;   RIGHT UPPER LOBECTOMY & Node Dissection  . LYMPH NODE BIOPSY Right 05/01/2014   Procedure: LYMPH NODE BIOPSY, Right Axillary;  Surgeon: [name], MD;  Location: [place];  Service: Thoracic;  Laterality: Right;  . PACEMAKER IMPLANT N/A 10/30/2017   Procedure: PACEMAKER IMPLANT;  Surgeon: [name], MD;  Location: [place] CV LAB;  Service: Cardiovascular;  Laterality: N/A;  . PARTIAL HYSTERECTOMY    . PERMANENT PACEMAKER INSERTION N/A 09/14/2012   Nanostim (SJM) leadless pacemaker (LEADLESS II STUDY PATIENT) implanted by Dr [name]  . VIDEO ASSISTED THORACOSCOPY (VATS)/WEDGE RESECTION Right 10/27/2012   Procedure: VIDEO ASSISTED THORACOSCOPY (VATS),RGHT UPPER LOBE LUNG WEDGE RESECTION;  Surgeon: [name], MD;  Location: [place];  Service: Thoracic;  Laterality: Right;    ROS- all systems are reviewed and negative except as per HPI above  Current Outpatient Medications  Medication Sig Dispense Refill  . acetaminophen (TYLENOL) 500 MG tablet Take 500 mg by mouth every 6 (six) hours as needed for moderate pain or fever.     [name] albuterol (PROVENTIL HFA;VENTOLIN HFA) 108 (90 BASE) MCG/ACT inhaler Inhale 1-2 puffs into the lungs every 6 (six) hours as needed for wheezing or shortness of breath.     [name] azithromycin (ZITHROMAX) 250 MG tablet He  was as instructed. 6 each 0  . diltiazem (CARDIZEM CD) 240 MG 24 hr capsule Take 1 capsule (240 mg total) by mouth daily. 90 capsule 3  . hydroxypropyl methylcellulose / hypromellose (ISOPTO TEARS / GONIOVISC) 2.5 % ophthalmic solution Place 1-2 drops into the left eye as needed for dry eyes.     [name] ipratropium-albuterol (DUONEB) 0.5-2.5 (3) MG/3ML SOLN Take 3 mLs by nebulization every 6 (six) hours as needed (shortness of breath or wheezing).     [name] lidocaine-prilocaine (EMLA) cream Apply 1 application topically as needed. (Patient taking differently: Apply 1 application topically as needed (for port). ) 30 g 4  . loratadine (CLARITIN) 10 MG tablet Take 10 mg by mouth daily.    . meclizine (ANTIVERT) 12.5 MG tablet Take 12.5 mg by mouth 3 (three) times daily as needed for dizziness. Takes 1/2 tablet tid as needed.     . Multiple Vitamin (MULTIVITAMIN) tablet Take 1 tablet by mouth daily.    . ondansetron (ZOFRAN ODT) 4 MG disintegrating tablet Take 1 tablet (4 mg total) by mouth every 8 (eight) hours as needed for nausea or vomiting. 6 tablet 0  . rivaroxaban (XARELTO) 20 MG TABS tablet Take 1 tablet (20 mg total) by mouth daily with supper. 30 tablet 11   No current facility-administered medications for this visit.     Physical Exam: Vitals:   02/01/18 1053  Height: 5\' 2"  (1.575 m)    GEN- The patient is well appearing, alert and oriented x 3 today.   Head- normocephalic, atraumatic Eyes-  Sclera clear, conjunctiva pink Ears- hearing intact Oropharynx- clear Lungs- Clear to ausculation bilaterally, normal work of breathing Chest- pacemaker pocket is well healed Heart- tachycardic irregular rhythm, no murmurs, rubs or gallops, PMI not laterally displaced GI- soft, NT, ND, + BS Extremities- no clubbing, cyanosis, or edema  Pacemaker interrogation- reviewed in detail today,  See PACEART report  ekg tracing ordered today is personally reviewed and shows afib, 95 bpm  Assessment and  Plan:  1. Symptomatic bradycardia  Normal pacemaker function See Pace Art report No changes today Leadless pacemaker is abandoned and no longer functioning  2. Persistent atrial fibrillation chads2vasc score is 3.  On xarelto She has been persistently in afib since device implant   She has palpitations and fatigue I have advised cardioversion Risks of cardioversion discussed with patient who wishes to proceed at the next available time. She will follow-up in AF clinic in 4 weeks  [place]lin Follow-up in AF clinic in 4 weeks I will see in [facility] device clinic in 3 months  [name] MD, [facility] 02/01/2018 11:18 AM

## 2018-02-01 NOTE — Telephone Encounter (Signed)
New message:       Pt is calling and states she would be free on 02/18/18 after 9:00 am. Pt states when we call her in the morning please leave a detailed msg. But she would rather have it on the 17th if possible because that's the only day her daughter will be able to bring her.

## 2018-02-01 NOTE — Progress Notes (Signed)
PCP: [name], [name] Primary Cardiologist: Dr Irish Lack Primary EP:  Dr Lillette Boxer Nancy Hodges is a 72 y.o. female who presents today for routine electrophysiology followup.  Since her recent PPM implant, the patient reports doing very well.  She remains in afib.  + symptoms of palpitations and fatigue. Today, she denies symptoms of chest pain, shortness of breath,  lower extremity edema, dizziness, presyncope, or syncope.  The patient is otherwise without complaint today.   Past Medical History:  Diagnosis Date  . Allergic rhinitis   . Anxiety   . Arthritis   . Asthma   . Atrial flutter ([place])   . Cholelithiases 09/26/2015  . Diverticulosis   . Encounter for antineoplastic immunotherapy 12/09/2016  . GERD (gastroesophageal reflux disease)   . H/O hiatal hernia   . History of blood transfusion   . History of chemotherapy   . History of kidney stones   . History of radiation therapy   . Internal hemorrhoid   . Lung cancer ([place])    a. Stage IB non-small cell carcinoma, s/p R VATS, wedge resection, RU lobectomy 10/2012.  . Paroxysmal atrial fibrillation (HCC)    a. anticoagulated with Xarelto  . Pneumonia 06/2016   [facility]  . Presence of permanent cardiac pacemaker   . Pulmonary nodule   . Tachycardia-bradycardia syndrome ([place])    a. 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   Past Surgical History:  Procedure Laterality Date  . CATARACT EXTRACTION W/PHACO Right 08/14/2016   Procedure: CATARACT EXTRACTION PHACO AND INTRAOCULAR LENS PLACEMENT RIGHT EYE CDE 10.53;  Surgeon: Tonny Branch, MD;  [facility];  Service: Ophthalmology;  Laterality: Right;  right  . CATARACT EXTRACTION W/PHACO Left 09/25/2016   Procedure: CATARACT EXTRACTION PHACO AND INTRAOCULAR LENS PLACEMENT (IOC);  Surgeon: Tonny Branch, MD;  [facility];  Service: Ophthalmology;  Laterality: Left;  left CDE 8.81  . COLONOSCOPY     [facility]: cattered sigmoid diverticula, internal hemorrhoids,  sessile polyp in ascending and mid transverse colon (tubular adenoma).   . colonoscopy with polypectomy  2015  . ESOPHAGOGASTRODUODENOSCOPY     [facility]: bile reflux in stomach, mild gastritis, hiatal hernia  . ESOPHAGOGASTRODUODENOSCOPY N/A 01/31/2016   Procedure: ESOPHAGOGASTRODUODENOSCOPY (EGD);  Surgeon: [name], MD;  [facility];  Service: Endoscopy;  Laterality: N/A;  1200  . IR FLUORO GUIDE PORT INSERTION RIGHT  06/30/2017  . IR US GUIDE VASC ACCESS RIGHT  06/30/2017  . LOBECTOMY Right 10/27/2012   Procedure: LOBECTOMY;  Surgeon: [name], MD;  Location: [place];  Service: Thoracic;  Laterality: Right;   RIGHT UPPER LOBECTOMY & Node Dissection  . LYMPH NODE BIOPSY Right 05/01/2014   Procedure: LYMPH NODE BIOPSY, Right Axillary;  Surgeon: [name], MD;  Location: [place];  Service: Thoracic;  Laterality: Right;  . PACEMAKER IMPLANT N/A 10/30/2017   Procedure: PACEMAKER IMPLANT;  Surgeon: [name], MD;  Location: [place] CV LAB;  Service: Cardiovascular;  Laterality: N/A;  . PARTIAL HYSTERECTOMY    . PERMANENT PACEMAKER INSERTION N/A 09/14/2012   Nanostim (SJM) leadless pacemaker (LEADLESS II STUDY PATIENT) implanted by Dr [name]  . VIDEO ASSISTED THORACOSCOPY (VATS)/WEDGE RESECTION Right 10/27/2012   Procedure: VIDEO ASSISTED THORACOSCOPY (VATS),RGHT UPPER LOBE LUNG WEDGE RESECTION;  Surgeon: [name], MD;  Location: [place];  Service: Thoracic;  Laterality: Right;    ROS- all systems are reviewed and negative except as per HPI above  Current Outpatient Medications  Medication Sig Dispense Refill  . acetaminophen (TYLENOL) 500 MG tablet Take 500 mg by mouth every 6 (six) hours as needed for moderate pain or fever.     [name] albuterol (PROVENTIL HFA;VENTOLIN HFA) 108 (90 BASE) MCG/ACT inhaler Inhale 1-2 puffs into the lungs every 6 (six) hours as needed for wheezing or shortness of breath.     [name] azithromycin (ZITHROMAX) 250 MG tablet He  was as instructed. 6 each 0  . diltiazem (CARDIZEM CD) 240 MG 24 hr capsule Take 1 capsule (240 mg total) by mouth daily. 90 capsule 3  . hydroxypropyl methylcellulose / hypromellose (ISOPTO TEARS / GONIOVISC) 2.5 % ophthalmic solution Place 1-2 drops into the left eye as needed for dry eyes.     [name] ipratropium-albuterol (DUONEB) 0.5-2.5 (3) MG/3ML SOLN Take 3 mLs by nebulization every 6 (six) hours as needed (shortness of breath or wheezing).     [name] lidocaine-prilocaine (EMLA) cream Apply 1 application topically as needed. (Patient taking differently: Apply 1 application topically as needed (for port). ) 30 g 4  . loratadine (CLARITIN) 10 MG tablet Take 10 mg by mouth daily.    . meclizine (ANTIVERT) 12.5 MG tablet Take 12.5 mg by mouth 3 (three) times daily as needed for dizziness. Takes 1/2 tablet tid as needed.     . Multiple Vitamin (MULTIVITAMIN) tablet Take 1 tablet by mouth daily.    . ondansetron (ZOFRAN ODT) 4 MG disintegrating tablet Take 1 tablet (4 mg total) by mouth every 8 (eight) hours as needed for nausea or vomiting. 6 tablet 0  . rivaroxaban (XARELTO) 20 MG TABS tablet Take 1 tablet (20 mg total) by mouth daily with supper. 30 tablet 11   No current facility-administered medications for this visit.     Physical Exam: Vitals:   02/01/18 1053  Height: 5\' 2"  (1.575 m)    GEN- The patient is well appearing, alert and oriented x 3 today.   Head- normocephalic, atraumatic Eyes-  Sclera clear, conjunctiva pink Ears- hearing intact Oropharynx- clear Lungs- Clear to ausculation bilaterally, normal work of breathing Chest- pacemaker pocket is well healed Heart- tachycardic irregular rhythm, no murmurs, rubs or gallops, PMI not laterally displaced GI- soft, NT, ND, + BS Extremities- no clubbing, cyanosis, or edema  Pacemaker interrogation- reviewed in detail today,  See PACEART report  ekg tracing ordered today is personally reviewed and shows afib, 95 bpm  Assessment and  Plan:  1. Symptomatic bradycardia  Normal pacemaker function See Pace Art report No changes today Leadless pacemaker is abandoned and no longer functioning  2. Persistent atrial fibrillation chads2vasc score is 3.  On xarelto She has been persistently in afib since device implant   She has palpitations and fatigue I have advised cardioversion Risks of cardioversion discussed with patient who wishes to proceed at the next available time. She will follow-up in AF clinic in 4 weeks  Merlin Follow-up in AF clinic in 4 weeks I will see in [facility] device clinic in 3 months  [name] MD, [facility] 02/01/2018 11:18 AM

## 2018-02-01 NOTE — Telephone Encounter (Signed)
Spoke with Pt. Notified unable to schedule procedure on 02/18/2018 d/t no anesthesia available.    Per Pt requesting 02/19/2018 at 11:00 am.  Call placed to scheduling.  Pt scheduled for DCCV on February 19, 2018 at 11:00 am-arrival time 9:30 am.

## 2018-02-01 NOTE — Telephone Encounter (Signed)
Pt calling back to sched her heart shock per pt

## 2018-02-02 NOTE — Telephone Encounter (Signed)
Pt called to confirm DCCV on 02/19/2018. Instruction letter mailed. Advised not to miss any doses of Xarelto. Pt indicates understanding.

## 2018-02-08 ENCOUNTER — Telehealth: Payer: Self-pay | Admitting: Medical Oncology

## 2018-02-08 ENCOUNTER — Encounter: Payer: Self-pay | Admitting: Medical Oncology

## 2018-02-08 NOTE — Telephone Encounter (Signed)
Needs letter of medical necessity for transportation to cancer center for treatment. Pt notified that letter will be faxed.

## 2018-02-08 NOTE — Progress Notes (Signed)
Letter for transportation faxed to Ms Nonnie Done [phone].

## 2018-02-09 ENCOUNTER — Ambulatory Visit: Payer: Medicare Other | Admitting: Internal Medicine

## 2018-02-09 ENCOUNTER — Ambulatory Visit: Payer: Medicare Other

## 2018-02-09 ENCOUNTER — Other Ambulatory Visit: Payer: Medicare Other

## 2018-02-12 ENCOUNTER — Ambulatory Visit
Admission: RE | Admit: 2018-02-12 | Discharge: 2018-02-12 | Disposition: A | Discharge status: Home / Self Care | Payer: Medicare Other | Source: Ambulatory Visit | Attending: Internal Medicine | Admitting: Internal Medicine

## 2018-02-12 ENCOUNTER — Ambulatory Visit: Payer: Medicare Other

## 2018-02-12 DIAGNOSIS — Z1231 Encounter for screening mammogram for malignant neoplasm of breast: Secondary | ICD-10-CM

## 2018-02-15 ENCOUNTER — Ambulatory Visit (HOSPITAL_COMMUNITY)
Admission: RE | Admit: 2018-02-15 | Discharge: 2018-02-15 | Disposition: A | Discharge status: Home / Self Care | Payer: Medicare Other | Source: Ambulatory Visit | Attending: Internal Medicine | Admitting: Internal Medicine

## 2018-02-15 ENCOUNTER — Encounter (HOSPITAL_COMMUNITY): Payer: Self-pay

## 2018-02-15 ENCOUNTER — Other Ambulatory Visit: Payer: Self-pay | Admitting: Oncology

## 2018-02-15 DIAGNOSIS — K7460 Unspecified cirrhosis of liver: Secondary | ICD-10-CM | POA: Insufficient documentation

## 2018-02-15 DIAGNOSIS — C3491 Malignant neoplasm of unspecified part of right bronchus or lung: Secondary | ICD-10-CM

## 2018-02-15 DIAGNOSIS — R918 Other nonspecific abnormal finding of lung field: Secondary | ICD-10-CM | POA: Diagnosis not present

## 2018-02-15 DIAGNOSIS — I700 Atherosclerosis of aorta: Secondary | ICD-10-CM | POA: Insufficient documentation

## 2018-02-15 DIAGNOSIS — K8020 Calculus of gallbladder without cholecystitis without obstruction: Secondary | ICD-10-CM | POA: Diagnosis not present

## 2018-02-15 DIAGNOSIS — K769 Liver disease, unspecified: Secondary | ICD-10-CM | POA: Insufficient documentation

## 2018-02-15 DIAGNOSIS — C3490 Malignant neoplasm of unspecified part of unspecified bronchus or lung: Secondary | ICD-10-CM | POA: Diagnosis present

## 2018-02-15 DIAGNOSIS — I480 Paroxysmal atrial fibrillation: Secondary | ICD-10-CM

## 2018-02-15 DIAGNOSIS — Z5112 Encounter for antineoplastic immunotherapy: Secondary | ICD-10-CM

## 2018-02-15 DIAGNOSIS — R590 Localized enlarged lymph nodes: Secondary | ICD-10-CM | POA: Diagnosis not present

## 2018-02-15 MED ORDER — IOPAMIDOL (ISOVUE-300) INJECTION 61%
INTRAVENOUS | Status: AC
  Filled 2018-02-15: qty 100

## 2018-02-15 MED ORDER — HEPARIN SOD (PORK) LOCK FLUSH 100 UNIT/ML IV SOLN
INTRAVENOUS | Status: AC
  Administered 2018-02-15: 500 [IU] via INTRAVENOUS
  Filled 2018-02-15: qty 5

## 2018-02-15 MED ORDER — IOPAMIDOL (ISOVUE-300) INJECTION 61%
100.0000 mL | Freq: Once | INTRAVENOUS | Status: AC | PRN
  Administered 2018-02-15: 100 mL via INTRAVENOUS

## 2018-02-15 MED ORDER — HEPARIN SOD (PORK) LOCK FLUSH 100 UNIT/ML IV SOLN
500.0000 [IU] | Freq: Once | INTRAVENOUS | Status: AC
  Administered 2018-02-15: 500 [IU] via INTRAVENOUS

## 2018-02-16 ENCOUNTER — Inpatient Hospital Stay: Payer: Medicare Other

## 2018-02-16 ENCOUNTER — Encounter: Payer: Self-pay | Admitting: Oncology

## 2018-02-16 ENCOUNTER — Inpatient Hospital Stay: Payer: Medicare Other | Attending: Internal Medicine | Admitting: Oncology

## 2018-02-16 VITALS — BP 143/73 | HR 77 | Temp 98.5°F | Resp 17 | Ht 62.0 in | Wt 150.0 lb

## 2018-02-16 DIAGNOSIS — Z79899 Other long term (current) drug therapy: Secondary | ICD-10-CM | POA: Diagnosis not present

## 2018-02-16 DIAGNOSIS — I480 Paroxysmal atrial fibrillation: Secondary | ICD-10-CM

## 2018-02-16 DIAGNOSIS — C3411 Malignant neoplasm of upper lobe, right bronchus or lung: Secondary | ICD-10-CM | POA: Insufficient documentation

## 2018-02-16 DIAGNOSIS — C773 Secondary and unspecified malignant neoplasm of axilla and upper limb lymph nodes: Secondary | ICD-10-CM | POA: Diagnosis present

## 2018-02-16 DIAGNOSIS — Z5112 Encounter for antineoplastic immunotherapy: Secondary | ICD-10-CM

## 2018-02-16 DIAGNOSIS — R490 Dysphonia: Secondary | ICD-10-CM

## 2018-02-16 DIAGNOSIS — Z95828 Presence of other vascular implants and grafts: Secondary | ICD-10-CM

## 2018-02-16 DIAGNOSIS — C3491 Malignant neoplasm of unspecified part of right bronchus or lung: Secondary | ICD-10-CM

## 2018-02-16 DIAGNOSIS — C3490 Malignant neoplasm of unspecified part of unspecified bronchus or lung: Secondary | ICD-10-CM

## 2018-02-16 LAB — CMP (CANCER CENTER ONLY)
ALK PHOS: 95 U/L (ref 38–126)
ALT: 22 U/L (ref 0–44)
ANION GAP: 7 (ref 5–15)
AST: 30 U/L (ref 15–41)
Albumin: 4.1 g/dL (ref 3.5–5.0)
BUN: 9 mg/dL (ref 8–23)
CALCIUM: 9.6 mg/dL (ref 8.9–10.3)
CO2: 28 mmol/L (ref 22–32)
Chloride: 105 mmol/L (ref 98–111)
Creatinine: 0.72 mg/dL (ref 0.44–1.00)
GFR, Est AFR Am: >60 mL/min (ref >60)
Glucose, Bld: 109 mg/dL — ABNORMAL HIGH (ref 70–99)
Potassium: 4.5 mmol/L (ref 3.5–5.1)
SODIUM: 140 mmol/L (ref 135–145)
Total Bilirubin: 1.2 mg/dL (ref 0.3–1.2)
Total Protein: 7.2 g/dL (ref 6.5–8.1)

## 2018-02-16 LAB — CBC WITH DIFFERENTIAL (CANCER CENTER ONLY)
Basophils Absolute: 0 10*3/uL (ref 0.0–0.1)
Basophils Relative: 1 %
Eosinophils Absolute: 0.1 10*3/uL (ref 0.0–0.5)
Eosinophils Relative: 3 %
HEMATOCRIT: 39.3 % (ref 34.8–46.6)
HEMOGLOBIN: 13.5 g/dL (ref 11.6–15.9)
Lymphocytes Relative: 22 %
Lymphs Abs: 1 10*3/uL (ref 0.9–3.3)
MCH: 33 pg (ref 25.1–34.0)
MCHC: 34.5 g/dL (ref 31.5–36.0)
MCV: 95.7 fL (ref 79.5–101.0)
Monocytes Absolute: 0.3 10*3/uL (ref 0.1–0.9)
Monocytes Relative: 7 %
NEUTROS ABS: 3 10*3/uL (ref 1.5–6.5)
Neutrophils Relative %: 67 %
Platelet Count: 188 10*3/uL (ref 145–400)
RBC: 4.11 MIL/uL (ref 3.70–5.45)
RDW: 13.8 % (ref 11.2–14.5)
WBC: 4.5 10*3/uL (ref 3.9–10.3)

## 2018-02-16 LAB — TSH: TSH: 1.721 u[IU]/mL (ref 0.308–3.960)

## 2018-02-16 MED ORDER — SODIUM CHLORIDE 0.9 % IV SOLN
Freq: Once | INTRAVENOUS | Status: AC
  Administered 2018-02-16: 12:00:00 via INTRAVENOUS

## 2018-02-16 MED ORDER — SODIUM CHLORIDE 0.9% FLUSH
10.0000 mL | INTRAVENOUS | Status: DC | PRN
  Administered 2018-02-16: 10 mL
  Filled 2018-02-16: qty 10

## 2018-02-16 MED ORDER — HEPARIN SOD (PORK) LOCK FLUSH 100 UNIT/ML IV SOLN
500.0000 [IU] | Freq: Once | INTRAVENOUS | Status: AC | PRN
  Administered 2018-02-16: 500 [IU]
  Filled 2018-02-16: qty 5

## 2018-02-16 MED ORDER — SODIUM CHLORIDE 0.9% FLUSH
10.0000 mL | INTRAVENOUS | Status: DC | PRN
  Administered 2018-02-16: 10 mL via INTRAVENOUS
  Filled 2018-02-16: qty 10

## 2018-02-16 MED ORDER — SODIUM CHLORIDE 0.9 % IV SOLN
480.0000 mg | Freq: Once | INTRAVENOUS | Status: AC
  Administered 2018-02-16: 480 mg via INTRAVENOUS
  Filled 2018-02-16: qty 48

## 2018-02-16 NOTE — Patient Instructions (Signed)
[facility] Discharge Instructions for Patients Receiving Chemotherapy  Today you received the following chemotherapy agents Opdivo  To help prevent nausea and vomiting after your treatment, we encourage you to take your nausea medication as directed   If you develop nausea and vomiting that is not controlled by your nausea medication, call the clinic.   BELOW ARE SYMPTOMS THAT SHOULD BE REPORTED IMMEDIATELY:  *FEVER GREATER THAN 100.5 F  *CHILLS WITH OR WITHOUT FEVER  NAUSEA AND VOMITING THAT IS NOT CONTROLLED WITH YOUR NAUSEA MEDICATION  *UNUSUAL SHORTNESS OF BREATH  *UNUSUAL BRUISING OR BLEEDING  TENDERNESS IN MOUTH AND THROAT WITH OR WITHOUT PRESENCE OF ULCERS  *URINARY PROBLEMS  *BOWEL PROBLEMS  UNUSUAL RASH Items with * indicate a potential emergency and should be followed up as soon as possible.  Feel free to call the clinic should you have any questions or concerns. The clinic phone number is [phone].  Please show the CHEMO ALERT CARD at check-in to the Emergency Department and triage nurse.   

## 2018-02-16 NOTE — Progress Notes (Signed)
[facility] OFFICE PROGRESS NOTE  [name], [name] [address] [place] [address]  DIAGNOSIS: Metastatic non-small cell lung cancer, adenocarcinoma. This was initially diagnosed as stage IB (T2a, N0, M0) in March of 2014, status post right upper lobectomy with lymph node dissection but the patient has evidence for disease recurrence in the right axilla status post resection.  PRIOR THERAPY:  1) Status post right axillary lymph node biopsy on 05/01/2014 under the care of Dr. Roxan Hockey. 2) Systemic chemotherapy with carboplatin for AUC of 5 and Alimta 500 mg/M2 every 3 weeks. First cycle 05/31/2014. Status post 6 cycles. Last dose was given 09/22/2014. 3) palliative radiotherapy to the subcarinal lymph node under the care of Dr. Sondra Come. 4) Nivolumab 240 mg IV every 2 weeks. First dose 12/23/2016. Status post 6 cycles.  CURRENT THERAPY:  Nivolumab 480 mg IV every 4 weeks. First dose 03/17/2017. Status post 12 cycles.  INTERVAL HISTORY: Nancy Hodges 72 y.o. female returns for a routine follow-up visit by herself.  The patient is feeling fine today with no specific complaints of her ongoing hoarseness of her voice and postnasal drainage.  She is received 2 rounds of antibiotics which helped minimally.  She has been on Claritin, Flonase, and Mucinex which have not really helped.  She has a nonproductive cough.  The patient denies fevers and chills.  Denies chest pain, shortness of breath, hemoptysis.  Denies nausea, vomiting, constipation or diarrhea.  Denies recent weight loss or night sweats.  The patient continues to tolerate treatment with Nivolumab fairly well.  The patient is here for discussion of her restaging CT scan results.  MEDICAL HISTORY: Past Medical History:  Diagnosis Date  . Allergic rhinitis   . Anxiety   . Arthritis   . Asthma   . Atrial flutter ([place])   . Cholelithiases 09/26/2015  . Diverticulosis   . Encounter for antineoplastic  immunotherapy 12/09/2016  . GERD (gastroesophageal reflux disease)   . H/O hiatal hernia   . History of blood transfusion   . History of chemotherapy   . History of kidney stones   . History of radiation therapy   . Internal hemorrhoid   . Lung cancer ([place])    a. Stage IB non-small cell carcinoma, s/p R VATS, wedge resection, RU lobectomy 10/2012.  . Paroxysmal atrial fibrillation ([facility])    a. anticoagulated with Xarelto  . Pneumonia 06/2016   [facility]  . Presence of permanent cardiac pacemaker   . Pulmonary nodule   . Tachycardia-bradycardia syndrome ([place])    a. 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    ALLERGIES:  is allergic to chlorpheniramine-dm; prednisone; septra [sulfamethoxazole-trimethoprim]; penicillins; and vioxx [rofecoxib].  MEDICATIONS:  Current Outpatient Medications  Medication Sig Dispense Refill  . acetaminophen (TYLENOL) 500 MG tablet Take 500 mg by mouth every 6 (six) hours as needed for moderate pain or fever.     [name] albuterol (PROVENTIL HFA;VENTOLIN HFA) 108 (90 BASE) MCG/ACT inhaler Inhale 1-2 puffs into the lungs every 6 (six) hours as needed for wheezing or shortness of breath.     . diltiazem (CARDIZEM CD) 240 MG 24 hr capsule Take 1 capsule (240 mg total) by mouth daily. 90 capsule 3  . hydroxypropyl methylcellulose / hypromellose (ISOPTO TEARS / GONIOVISC) 2.5 % ophthalmic solution Place 1-2 drops into the left eye as needed for dry eyes.     [name] ipratropium-albuterol (DUONEB) 0.5-2.5 (3) MG/3ML SOLN Take 3 mLs by nebulization every 6 (six) hours  as needed (shortness of breath or wheezing).     [name] lidocaine-prilocaine (EMLA) cream Apply 1 application topically as needed. (Patient taking differently: Apply 1 application topically as needed (for port). ) 30 g 4  . loratadine (CLARITIN) 10 MG tablet Take 10 mg by mouth daily.    . meclizine (ANTIVERT) 12.5 MG tablet Take 12.5 mg by mouth 3 (three) times daily as needed for dizziness. Takes 1/2 tablet tid as  needed.     . ondansetron (ZOFRAN ODT) 4 MG disintegrating tablet Take 1 tablet (4 mg total) by mouth every 8 (eight) hours as needed for nausea or vomiting. 6 tablet 0  . rivaroxaban (XARELTO) 20 MG TABS tablet Take 1 tablet (20 mg total) by mouth daily with supper. 30 tablet 11   No current facility-administered medications for this visit.    Facility-Administered Medications Ordered in Other Visits  Medication Dose Route Frequency Provider Last Rate Last Dose  . heparin lock flush 100 unit/mL  500 Units Intracatheter Once PRN Curt Bears, MD      . nivolumab (OPDIVO) 480 mg in sodium chloride 0.9 % 100 mL chemo infusion  480 mg Intravenous Once Curt Bears, MD 296 mL/hr at 02/16/18 1232 480 mg at 02/16/18 1232  . sodium chloride flush (NS) 0.9 % injection 10 mL  10 mL Intracatheter PRN Curt Bears, MD        SURGICAL HISTORY:  Past Surgical History:  Procedure Laterality Date  . CATARACT EXTRACTION W/PHACO Right 08/14/2016   Procedure: CATARACT EXTRACTION PHACO AND INTRAOCULAR LENS PLACEMENT RIGHT EYE CDE 10.53;  Surgeon: Tonny Branch, MD;  Location: [facility];  Service: Ophthalmology;  Laterality: Right;  right  . CATARACT EXTRACTION W/PHACO Left 09/25/2016   Procedure: CATARACT EXTRACTION PHACO AND INTRAOCULAR LENS PLACEMENT (IOC);  Surgeon: Tonny Branch, MD;  Location: [facility];  Service: Ophthalmology;  Laterality: Left;  left CDE 8.81  . COLONOSCOPY     [facility]: cattered sigmoid diverticula, internal hemorrhoids, sessile polyp in ascending and mid transverse colon (tubular adenoma).   . colonoscopy with polypectomy  2015  . ESOPHAGOGASTRODUODENOSCOPY     [facility]: bile reflux in stomach, mild gastritis, hiatal hernia  . ESOPHAGOGASTRODUODENOSCOPY N/A 01/31/2016   Procedure: ESOPHAGOGASTRODUODENOSCOPY (EGD);  Surgeon: [name], MD;  Location: [facility];  Service: Endoscopy;  Laterality: N/A;  1200  . IR FLUORO GUIDE PORT INSERTION RIGHT  06/30/2017  . IR US GUIDE  VASC ACCESS RIGHT  06/30/2017  . LOBECTOMY Right 10/27/2012   Procedure: LOBECTOMY;  Surgeon: [name], MD;  Location: [facility];  Service: Thoracic;  Laterality: Right;   RIGHT UPPER LOBECTOMY & Node Dissection  . LYMPH NODE BIOPSY Right 05/01/2014   Procedure: LYMPH NODE BIOPSY, Right Axillary;  Surgeon: [name], MD;  Location: [facility];  Service: Thoracic;  Laterality: Right;  . PACEMAKER IMPLANT N/A 10/30/2017   SJM Assurity MRI PPM implanted by Dr [name] once leadless pacemaker battery depleted  . PARTIAL HYSTERECTOMY    . PERMANENT PACEMAKER INSERTION N/A 09/14/2012   Nanostim (SJM) leadless pacemaker (LEADLESS II STUDY PATIENT) implanted by Dr [name], no longer functioning, device abandoned.  [name] VIDEO ASSISTED THORACOSCOPY (VATS)/WEDGE RESECTION Right 10/27/2012   Procedure: VIDEO ASSISTED THORACOSCOPY (VATS),RGHT UPPER LOBE LUNG WEDGE RESECTION;  Surgeon: [name], MD;  Location: [facility];  Service: Thoracic;  Laterality: Right;    REVIEW OF SYSTEMS:   Review of Systems  Constitutional: Negative for appetite change, chills, fatigue, fever and unexpected weight change.  HENT:   Negative for mouth sores, nosebleeds, sore throat and trouble swallowing.  Positive for hoarseness in her voice and postnasal drainage. Eyes: Negative for eye problems and icterus.  Respiratory: Negative for hemoptysis, shortness of breath and wheezing.  Positive for nonproductive cough.  Cardiovascular: Negative for chest pain and leg swelling.  Gastrointestinal: Negative for abdominal pain, constipation, diarrhea, nausea and vomiting.  Genitourinary: Negative for bladder incontinence, difficulty urinating, dysuria, frequency and hematuria.   Musculoskeletal: Negative for back pain, gait problem, neck pain and neck stiffness.  Skin: Negative for itching and rash.  Neurological: Negative for dizziness, extremity weakness, gait problem, headaches, light-headedness and seizures.   Hematological: Negative for adenopathy. Does not bruise/bleed easily.  Psychiatric/Behavioral: Negative for confusion, depression and sleep disturbance. The patient is not nervous/anxious.     PHYSICAL EXAMINATION:  Blood pressure (!) 143/73, pulse 77, temperature 98.5 F (36.9 C), temperature source Oral, resp. rate 17, height 5\' 2"  (1.575 m), weight 150 lb (68 kg), SpO2 98 %.  ECOG PERFORMANCE STATUS: 1 - Symptomatic but completely ambulatory  Physical Exam  Constitutional: Oriented to person, place, and time and well-developed, well-nourished, and in no distress. No distress.  HENT:  Head: Normocephalic and atraumatic.  Mouth/Throat: Oropharynx is clear and moist. No oropharyngeal exudate.  Eyes: Conjunctivae are normal. Right eye exhibits no discharge. Left eye exhibits no discharge. No scleral icterus.  Neck: Normal range of motion. Neck supple.  Cardiovascular: Normal rate, regular rhythm, normal heart sounds and intact distal pulses.   Pulmonary/Chest: Effort normal and breath sounds normal. No respiratory distress. No wheezes. No rales.  Abdominal: Soft. Bowel sounds are normal. Exhibits no distension and no mass. There is no tenderness.  Musculoskeletal: Normal range of motion. Exhibits no edema.  Lymphadenopathy:    No cervical adenopathy.  Neurological: Alert and oriented to person, place, and time. Exhibits normal muscle tone. Gait normal. Coordination normal.  Skin: Skin is warm and dry. No rash noted. Not diaphoretic. No erythema. No pallor.  Psychiatric: Mood, memory and judgment normal.  Vitals reviewed.  LABORATORY DATA: Lab Results  Component Value Date   WBC 4.5 02/16/2018   HGB 13.5 02/16/2018   HCT 39.3 02/16/2018   MCV 95.7 02/16/2018   PLT 188 02/16/2018      Chemistry      Component Value Date/Time   NA 140 02/16/2018 1001   NA 139 08/05/2017 0902   K 4.5 02/16/2018 1001   K 4.1 08/05/2017 0902   CL 105 02/16/2018 1001   CO2 28 02/16/2018 1001    CO2 28 08/05/2017 0902   BUN 9 02/16/2018 1001   BUN 10.1 08/05/2017 0902   CREATININE 0.72 02/16/2018 1001   CREATININE 0.8 08/05/2017 0902   GLU 134 (H) 02/18/2017 1410      Component Value Date/Time   CALCIUM 9.6 02/16/2018 1001   CALCIUM 9.5 08/05/2017 0902   ALKPHOS 95 02/16/2018 1001   ALKPHOS 111 08/05/2017 0902   AST 30 02/16/2018 1001   AST 33 08/05/2017 0902   ALT 22 02/16/2018 1001   ALT 34 08/05/2017 0902   BILITOT 1.2 02/16/2018 1001   BILITOT 1.39 (H) 08/05/2017 0902       RADIOGRAPHIC STUDIES:  Ct Chest W Contrast  Result Date: 02/15/2018 CLINICAL DATA:  Non-small-cell lung cancer on the right. Initially diagnosed in March 2014 with right upper lobectomy and subsequent right axillary nodal recurrence. EXAM: CT CHEST, ABDOMEN, AND PELVIS WITH CONTRAST TECHNIQUE: Multidetector CT imaging of the chest, abdomen  and pelvis was performed following the standard protocol during bolus administration of intravenous contrast. CONTRAST:  154mL ISOVUE-300 IOPAMIDOL (ISOVUE-300) INJECTION 61% COMPARISON:  CT 11/23/2016 and 08/23/2017. FINDINGS: CT CHEST FINDINGS Cardiovascular: There is atherosclerosis of aorta, great vessels and coronary arteries. Right IJ Port-A-Cath extends to the superior cavoatrial junction. The left subclavian pacemaker leads are unchanged in position. No acute vascular findings are seen. The heart size is normal. There is no pericardial effusion. Mediastinum/Nodes: There are no enlarged mediastinal, hilar or axillary lymph nodes. Right axillary surgical clips. Right supraclavicular node measuring 7 mm on image 11/2 is slightly more prominent than on previous study. There is a stable small hiatal hernia. Lungs/Pleura: There is no pleural effusion. The right lung has a stable appearance status post right upper lobectomy. There is scattered subpleural scarring in the right lung. There is new clustered nodularity in the left upper lingula (images 61-74/6 and 86  through 94/6), likely inflammatory. There is a stable left apical 8 x 5 mm ground-glass nodule image 34/6. No highly suspicious nodules. Musculoskeletal/Chest wall: No chest wall mass or suspicious osseous findings. CT ABDOMEN AND PELVIS FINDINGS Hepatobiliary: Contour irregularity of the liver consistent with cirrhosis again noted. There is a small hypervascular lesion in the dome of the right hepatic lobe, measuring 9 mm on image 48/2. This is questionably present on the prior study, although more conspicuous today. No hypoattenuating liver lesions. There is a small recanalized left paraumbilical vein. Multiple gallstones are present. No gallbladder wall thickening or biliary dilatation. Pancreas: Unremarkable. No pancreatic ductal dilatation or surrounding inflammatory changes. Spleen: There is a stable 13 mm low-density lesion inferiorly on image 66/2. Stable mild splenomegaly. Adrenals/Urinary Tract: Both adrenal glands appear normal. Stable mild cortical scarring in both kidneys. No evidence of urinary tract calculus, hydronephrosis or renal mass. The bladder appears normal. Stomach/Bowel: No evidence of bowel wall thickening, distention or surrounding inflammatory change. The appendix appears normal. There are stable diverticular changes in the descending and sigmoid colon. Vascular/Lymphatic: There are stable prominent lymph nodes in the upper abdomen, including an 11 mm portacaval node on image 64/2 and a 7 mm aortocaval node on image 72/2. There is aortic and branch vessel atherosclerosis with long segment, non ostial stenosis SMA lumen which appears unchanged. No evidence of large vessel occlusion. Reproductive: Hysterectomy.  Stable appearance of the adnexa. Other: No evidence of abdominal wall mass or hernia. No ascites. Musculoskeletal: No acute or significant osseous findings. IMPRESSION: 1. No findings suspicious for local recurrence or metastatic disease. 2. New clustered nodularity in the left  upper lobe and lingula, likely inflammatory. Attention on follow-up recommended. 3. Small right supraclavicular node is minimally larger than on the prior study. No other thoracic adenopathy. Upper abdominal lymph nodes are stable. 4. Morphologic changes of cirrhosis with small hypervascular lesion in the dome of the right lobe. This would be best evaluated by MRI, although that may be precluded by the patient's pacemaker. Attention on follow-up recommended. 5. Cholelithiasis. 6. Aortic Atherosclerosis (ICD10-I70.0). Electronically Signed   By: Richardean Sale M.D.   On: 02/15/2018 13:31   Ct Abdomen Pelvis W Contrast  Result Date: 02/15/2018 CLINICAL DATA:  Non-small-cell lung cancer on the right. Initially diagnosed in March 2014 with right upper lobectomy and subsequent right axillary nodal recurrence. EXAM: CT CHEST, ABDOMEN, AND PELVIS WITH CONTRAST TECHNIQUE: Multidetector CT imaging of the chest, abdomen and pelvis was performed following the standard protocol during bolus administration of intravenous contrast. CONTRAST:  148mL ISOVUE-300 IOPAMIDOL (ISOVUE-300)  INJECTION 61% COMPARISON:  CT 11/23/2016 and 08/23/2017. FINDINGS: CT CHEST FINDINGS Cardiovascular: There is atherosclerosis of aorta, great vessels and coronary arteries. Right IJ Port-A-Cath extends to the superior cavoatrial junction. The left subclavian pacemaker leads are unchanged in position. No acute vascular findings are seen. The heart size is normal. There is no pericardial effusion. Mediastinum/Nodes: There are no enlarged mediastinal, hilar or axillary lymph nodes. Right axillary surgical clips. Right supraclavicular node measuring 7 mm on image 11/2 is slightly more prominent than on previous study. There is a stable small hiatal hernia. Lungs/Pleura: There is no pleural effusion. The right lung has a stable appearance status post right upper lobectomy. There is scattered subpleural scarring in the right lung. There is new clustered  nodularity in the left upper lingula (images 61-74/6 and 86 through 94/6), likely inflammatory. There is a stable left apical 8 x 5 mm ground-glass nodule image 34/6. No highly suspicious nodules. Musculoskeletal/Chest wall: No chest wall mass or suspicious osseous findings. CT ABDOMEN AND PELVIS FINDINGS Hepatobiliary: Contour irregularity of the liver consistent with cirrhosis again noted. There is a small hypervascular lesion in the dome of the right hepatic lobe, measuring 9 mm on image 48/2. This is questionably present on the prior study, although more conspicuous today. No hypoattenuating liver lesions. There is a small recanalized left paraumbilical vein. Multiple gallstones are present. No gallbladder wall thickening or biliary dilatation. Pancreas: Unremarkable. No pancreatic ductal dilatation or surrounding inflammatory changes. Spleen: There is a stable 13 mm low-density lesion inferiorly on image 66/2. Stable mild splenomegaly. Adrenals/Urinary Tract: Both adrenal glands appear normal. Stable mild cortical scarring in both kidneys. No evidence of urinary tract calculus, hydronephrosis or renal mass. The bladder appears normal. Stomach/Bowel: No evidence of bowel wall thickening, distention or surrounding inflammatory change. The appendix appears normal. There are stable diverticular changes in the descending and sigmoid colon. Vascular/Lymphatic: There are stable prominent lymph nodes in the upper abdomen, including an 11 mm portacaval node on image 64/2 and a 7 mm aortocaval node on image 72/2. There is aortic and branch vessel atherosclerosis with long segment, non ostial stenosis SMA lumen which appears unchanged. No evidence of large vessel occlusion. Reproductive: Hysterectomy.  Stable appearance of the adnexa. Other: No evidence of abdominal wall mass or hernia. No ascites. Musculoskeletal: No acute or significant osseous findings. IMPRESSION: 1. No findings suspicious for local recurrence or  metastatic disease. 2. New clustered nodularity in the left upper lobe and lingula, likely inflammatory. Attention on follow-up recommended. 3. Small right supraclavicular node is minimally larger than on the prior study. No other thoracic adenopathy. Upper abdominal lymph nodes are stable. 4. Morphologic changes of cirrhosis with small hypervascular lesion in the dome of the right lobe. This would be best evaluated by MRI, although that may be precluded by the patient's pacemaker. Attention on follow-up recommended. 5. Cholelithiasis. 6. Aortic Atherosclerosis (ICD10-I70.0). Electronically Signed   By: Richardean Sale M.D.   On: 02/15/2018 13:31   Mm 3d Screen Breast Bilateral  Result Date: 02/12/2018 CLINICAL DATA:  Screening. EXAM: DIGITAL SCREENING BILATERAL MAMMOGRAM WITH TOMO AND CAD COMPARISON:  Previous exam(s). ACR Breast Density Category c: The breast tissue is heterogeneously dense, which may obscure small masses. FINDINGS: There are no findings suspicious for malignancy. Images were processed with CAD. IMPRESSION: No mammographic evidence of malignancy. A result letter of this screening mammogram will be mailed directly to the patient. RECOMMENDATION: Screening mammogram in one year. (Code:[id number]-[id number]) BI-RADS CATEGORY  1: Negative. Electronically Signed  By: Fidela Salisbury M.D.   On: 02/12/2018 11:16     ASSESSMENT/PLAN:  Lung cancer, primary, with metastasis from lung to other site This is a very pleasant 72 year old white female with metastatic non-small cell lung cancer, adenocarcinoma with no actionable mutations status post systemic chemotherapy with carboplatin and Alimta and she is currently undergoing treatment with second line immunotherapy with Nivolumab status post 6 cycles. This was followed by immunotherapy with Nivolumab 480 mg IV every 4 weeks status post 12 cycles. The patient continues to tolerate her treatment well with no concerning complaints. She had a restaging CT  scan is here to discuss the results.  The patient was seen with Dr. Earlie Server.  CT scan results were discussed with the patient which shows no evidence of disease progression.  Recommend for her to continue Nivolumab 480 mg IV every 4 weeks.  She will proceed with cycle #13 today as scheduled.  The patient will follow-up in 4 weeks for evaluation prior to cycle #14 of her treatment.  For the hoarseness in her voice, we have referred the patient for ENT for evaluation.  The patient was advised to call immediately if she has any concerning symptoms in the interval. The patient voices understanding of current disease status and treatment options and is in agreement with the current care plan. All questions were answered. The patient knows to call the clinic with any problems, questions or concerns. We can certainly see the patient much sooner if necessary.   Orders Placed This Encounter  Procedures  . Ambulatory referral to ENT    Referral Priority:   Routine    Referral Type:   Consultation    Referral Reason:   Specialty Services Required    Referred to Provider:   Leta Baptist, MD    Requested Specialty:   Otolaryngology    Number of Visits Requested:   1   [name], DNP, AGPCNP-BC, AOCNP 02/16/18   ADDENDUM:  Hematology/Oncology Attending: I had a face-to-face encounter with the patient.  I recommended her care plan.  This is a very pleasant 72 years old white female with metastatic non-small cell lung cancer, adenocarcinoma status post several chemotherapy regimens and she is currently on treatment with immunotherapy was Nivolumab 480 mg IV every 4 weeks status post 12 cycles.  She continues to tolerate this treatment well with no concerning complaints. The patient had repeat CT scan of the chest, abdomen and pelvis performed recently.  I personally and independently reviewed the scans and discussed the results with the patient today.  Her scan showed no concerning findings for disease  progression.  I recommended for the patient to continue her current treatment with Nivolumab and she will proceed with cycle #13 today. For the hoarseness of her voice will refer the patient to ENT for evaluation. The patient will come back for follow-up visit in 4 weeks for evaluation before the next cycle of her treatment. She was advised to call immediately if she has any concerning symptoms in the interval.  Disclaimer: This note was dictated with voice recognition software. Similar sounding words can inadvertently be transcribed and may be missed upon review. Eilleen Kempf, MD 02/17/18

## 2018-02-16 NOTE — Assessment & Plan Note (Addendum)
This is a very pleasant 72 year old white female with metastatic non-small cell lung cancer, adenocarcinoma with no actionable mutations status post systemic chemotherapy with carboplatin and Alimta and she is currently undergoing treatment with second line immunotherapy with Nivolumab status post 6 cycles. This was followed by immunotherapy with Nivolumab 480 mg IV every 4 weeks status post 12 cycles. The patient continues to tolerate her treatment well with no concerning complaints. She had a restaging CT scan is here to discuss the results.  The patient was seen with Dr. Earlie Server.  CT scan results were discussed with the patient which shows no evidence of disease progression.  Recommend for her to continue Nivolumab 480 mg IV every 4 weeks.  She will proceed with cycle #13 today as scheduled.  The patient will follow-up in 4 weeks for evaluation prior to cycle #14 of her treatment.  For the hoarseness in her voice, we have referred the patient for ENT for evaluation.  The patient was advised to call immediately if she has any concerning symptoms in the interval. The patient voices understanding of current disease status and treatment options and is in agreement with the current care plan. All questions were answered. The patient knows to call the clinic with any problems, questions or concerns. We can certainly see the patient much sooner if necessary.

## 2018-02-18 ENCOUNTER — Telehealth: Payer: Self-pay | Admitting: Internal Medicine

## 2018-02-18 NOTE — Telephone Encounter (Signed)
Patient given avs report and appointments for August thru October prior to leaving 7/16 and informed she would be contacted by ENT office. ENT referral to Dr. Tamsen Roers Teoh moved to [facility]. Office is on new system online in [facility] and will contact patient re appointment.

## 2018-02-19 ENCOUNTER — Ambulatory Visit (HOSPITAL_COMMUNITY): Payer: Medicare Other | Admitting: Certified Registered"

## 2018-02-19 ENCOUNTER — Other Ambulatory Visit: Payer: Self-pay

## 2018-02-19 ENCOUNTER — Encounter (HOSPITAL_COMMUNITY)
Admission: RE | Disposition: A | Discharge status: Home / Self Care | Payer: Self-pay | Source: Ambulatory Visit | Attending: Cardiovascular Disease

## 2018-02-19 ENCOUNTER — Encounter (HOSPITAL_COMMUNITY): Payer: Self-pay | Admitting: Certified Registered"

## 2018-02-19 ENCOUNTER — Ambulatory Visit (HOSPITAL_COMMUNITY)
Admission: RE | Admit: 2018-02-19 | Discharge: 2018-02-19 | Disposition: A | Discharge status: Home / Self Care | Payer: Medicare Other | Source: Ambulatory Visit | Attending: Cardiovascular Disease | Admitting: Cardiovascular Disease

## 2018-02-19 DIAGNOSIS — I495 Sick sinus syndrome: Secondary | ICD-10-CM | POA: Diagnosis not present

## 2018-02-19 DIAGNOSIS — M1990 Unspecified osteoarthritis, unspecified site: Secondary | ICD-10-CM | POA: Insufficient documentation

## 2018-02-19 DIAGNOSIS — K219 Gastro-esophageal reflux disease without esophagitis: Secondary | ICD-10-CM | POA: Diagnosis not present

## 2018-02-19 DIAGNOSIS — Z7901 Long term (current) use of anticoagulants: Secondary | ICD-10-CM | POA: Diagnosis not present

## 2018-02-19 DIAGNOSIS — J449 Chronic obstructive pulmonary disease, unspecified: Secondary | ICD-10-CM | POA: Insufficient documentation

## 2018-02-19 DIAGNOSIS — F419 Anxiety disorder, unspecified: Secondary | ICD-10-CM | POA: Diagnosis not present

## 2018-02-19 DIAGNOSIS — Z9221 Personal history of antineoplastic chemotherapy: Secondary | ICD-10-CM | POA: Diagnosis not present

## 2018-02-19 DIAGNOSIS — Z923 Personal history of irradiation: Secondary | ICD-10-CM | POA: Insufficient documentation

## 2018-02-19 DIAGNOSIS — I481 Persistent atrial fibrillation: Secondary | ICD-10-CM | POA: Insufficient documentation

## 2018-02-19 DIAGNOSIS — Z87891 Personal history of nicotine dependence: Secondary | ICD-10-CM | POA: Diagnosis not present

## 2018-02-19 DIAGNOSIS — I480 Paroxysmal atrial fibrillation: Secondary | ICD-10-CM

## 2018-02-19 HISTORY — PX: CARDIOVERSION: SHX1299

## 2018-02-19 SURGERY — CARDIOVERSION
Anesthesia: General

## 2018-02-19 MED ORDER — LIDOCAINE 2% (20 MG/ML) 5 ML SYRINGE
INTRAMUSCULAR | Status: DC | PRN
  Administered 2018-02-19: 100 mg via INTRAVENOUS

## 2018-02-19 MED ORDER — SODIUM CHLORIDE 0.9 % IV SOLN
INTRAVENOUS | Status: DC
  Administered 2018-02-19: 500 mL via INTRAVENOUS

## 2018-02-19 MED ORDER — LIDOCAINE-PRILOCAINE 2.5-2.5 % EX CREA: TOPICAL_CREAM | Freq: Once | CUTANEOUS | Status: DC

## 2018-02-19 MED ORDER — PROPOFOL 10 MG/ML IV BOLUS
INTRAVENOUS | Status: DC | PRN
  Administered 2018-02-19: 60 mg via INTRAVENOUS

## 2018-02-19 NOTE — Transfer of Care (Signed)
Immediate Anesthesia Transfer of Care Note  Patient: Nancy Hodges  Procedure(s) Performed: CARDIOVERSION (N/A )  Patient Location: Endoscopy Unit  Anesthesia Type:General  Level of Consciousness: drowsy  Airway & Oxygen Therapy: Patient Spontanous Breathing  Post-op Assessment: Report given to RN and Post -op Vital signs reviewed and stable  Post vital signs: Reviewed and stable  Last Vitals:  Vitals Value Taken Time  BP    Temp    Pulse 73 02/19/2018 10:35 AM  Resp 22 02/19/2018 10:35 AM  SpO2 98 % 02/19/2018 10:35 AM  Vitals shown include unvalidated device data.  Last Pain:  Vitals:   02/19/18 0938  TempSrc: Oral  PainSc: 0-No pain         Complications: No apparent anesthesia complications

## 2018-02-19 NOTE — Anesthesia Preprocedure Evaluation (Signed)
Anesthesia Evaluation  Patient identified by MRN, date of birth, ID band Patient awake    Reviewed: Allergy & Precautions, NPO status , Patient's Chart, lab work & pertinent test results  Airway Mallampati: II  TM Distance: >3 FB Neck ROM: Full    Dental no notable dental hx.    Pulmonary asthma , COPD, former smoker,    Pulmonary exam normal breath sounds clear to auscultation       Cardiovascular Normal cardiovascular exam+ dysrhythmias Atrial Fibrillation + pacemaker  Rhythm:Regular Rate:Normal     Neuro/Psych negative neurological ROS  negative psych ROS   GI/Hepatic negative GI ROS, Neg liver ROS,   Endo/Other  negative endocrine ROS  Renal/GU negative Renal ROS  negative genitourinary   Musculoskeletal negative musculoskeletal ROS (+)   Abdominal   Peds negative pediatric ROS (+)  Hematology negative hematology ROS (+)   Anesthesia Other Findings   Reproductive/Obstetrics negative OB ROS                             Anesthesia Physical Anesthesia Plan  ASA: III  Anesthesia Plan: General   Post-op Pain Management:    Induction: Intravenous  PONV Risk Score and Plan: 3 and Treatment may vary due to age or medical condition and Propofol infusion  Airway Management Planned: Mask  Additional Equipment:   Intra-op Plan:   Post-operative Plan: Extubation in OR  Informed Consent: I have reviewed the patients History and Physical, chart, labs and discussed the procedure including the risks, benefits and alternatives for the proposed anesthesia with the patient or authorized representative who has indicated his/her understanding and acceptance.   Dental advisory given  Plan Discussed with: CRNA  Anesthesia Plan Comments:         Anesthesia Quick Evaluation

## 2018-02-19 NOTE — Interval H&P Note (Signed)
History and Physical Interval Note:  02/19/2018 9:17 AM  Nancy Hodges  has presented today for surgery, with the diagnosis of afib  The various methods of treatment have been discussed with the patient and family. After consideration of risks, benefits and other options for treatment, the patient has consented to  Procedure(s): CARDIOVERSION (N/A) as a surgical intervention .  The patient's history has been reviewed, patient examined, no change in status, stable for surgery.  I have reviewed the patient's chart and labs.  Questions were answered to the patient's satisfaction.     Nasim Garofano

## 2018-02-19 NOTE — Discharge Instructions (Signed)
Electrical Cardioversion, Care After °This sheet gives you information about how to care for yourself after your procedure. Your health care provider may also give you more specific instructions. If you have problems or questions, contact your health care provider. °What can I expect after the procedure? °After the procedure, it is common to have: °· Some redness on the skin where the shocks were given. ° °Follow these instructions at home: °· Do not drive for 24 hours if you were given a medicine to help you relax (sedative). °· Take over-the-counter and prescription medicines only as told by your health care provider. °· Ask your health care provider how to check your pulse. Check it often. °· Rest for 48 hours after the procedure or as told by your health care provider. °· Avoid or limit your caffeine use as told by your health care provider. °Contact a health care provider if: °· You feel like your heart is beating too quickly or your pulse is not regular. °· You have a serious muscle cramp that does not go away. °Get help right away if: °· You have discomfort in your chest. °· You are dizzy or you feel faint. °· You have trouble breathing or you are short of breath. °· Your speech is slurred. °· You have trouble moving an arm or leg on one side of your body. °· Your fingers or toes turn cold or blue. °This information is not intended to replace advice given to you by your health care provider. Make sure you discuss any questions you have with your health care provider. °Document Released: 05/11/2013 Document Revised: 02/22/2016 Document Reviewed: 01/25/2016 °Elsevier Interactive Patient Education © 2018 [employer] ° °

## 2018-02-19 NOTE — Anesthesia Postprocedure Evaluation (Signed)
Anesthesia Post Note  Patient: Nancy Hodges  Procedure(s) Performed: CARDIOVERSION (N/A )     Patient location during evaluation: Endoscopy Anesthesia Type: General Level of consciousness: awake and alert Pain management: pain level controlled Vital Signs Assessment: post-procedure vital signs reviewed and stable Respiratory status: spontaneous breathing, nonlabored ventilation, respiratory function stable and patient connected to nasal cannula oxygen Cardiovascular status: blood pressure returned to baseline and stable Postop Assessment: no apparent nausea or vomiting Anesthetic complications: no    Last Vitals:  Vitals:   02/19/18 1100 02/19/18 1110  BP: (!) 150/58 (!) 145/61  Pulse: 76 75  Resp: 19 (!) 23  Temp:    SpO2: 97% 98%    Last Pain:  Vitals:   02/19/18 1050  TempSrc:   PainSc: 0-No pain                 Montez Hageman

## 2018-02-19 NOTE — Anesthesia Procedure Notes (Signed)
Procedure Name: General with mask airway Date/Time: 02/19/2018 10:40 AM Performed by: Imagene Riches, CRNA Pre-anesthesia Checklist: Patient identified, Emergency Drugs available, Suction available, Patient being monitored and Timeout performed Patient Re-evaluated:Patient Re-evaluated prior to induction Oxygen Delivery Method: Ambu bag Preoxygenation: Pre-oxygenation with 100% oxygen Induction Type: IV induction

## 2018-02-19 NOTE — Op Note (Addendum)
Procedure: Electrical Cardioversion Indications:  Atrial Fibrillation  Procedure Details:  Consent: Risks of procedure as well as the alternatives and risks of each were explained to the (patient/caregiver).  Consent for procedure obtained.  Time Out: Verified patient identification, verified procedure, site/side was marked, verified correct patient position, special equipment/implants available, medications/allergies/relevent history reviewed, required imaging and test results available.  Performed  Patient placed on cardiac monitor, pulse oximetry, supplemental oxygen as necessary.  Sedation given: propofol 60 mg IV Pacer pads placed anterior and posterior chest.  Cardioverted 1 time(s).  Cardioversion with synchronized biphasic 120J shock.  Evaluation: Findings: Post procedure EKG shows: NSR Complications: None Patient did tolerate procedure well.  Normal pacemaker leads and battery parameters after the procedure.  Time Spent Directly with the Patient:  30 minutes   Thomas Mabry 02/19/2018, 10:46 AM

## 2018-02-20 ENCOUNTER — Encounter (HOSPITAL_COMMUNITY): Payer: Self-pay

## 2018-02-20 ENCOUNTER — Other Ambulatory Visit: Payer: Self-pay

## 2018-02-20 ENCOUNTER — Emergency Department (HOSPITAL_COMMUNITY)
Admission: EM | Admit: 2018-02-20 | Discharge: 2018-02-20 | Disposition: A | Discharge status: Home / Self Care | Payer: Medicare Other | Attending: Emergency Medicine | Admitting: Emergency Medicine

## 2018-02-20 ENCOUNTER — Emergency Department (HOSPITAL_COMMUNITY): Payer: Medicare Other

## 2018-02-20 DIAGNOSIS — Z950 Presence of cardiac pacemaker: Secondary | ICD-10-CM | POA: Insufficient documentation

## 2018-02-20 DIAGNOSIS — Z87891 Personal history of nicotine dependence: Secondary | ICD-10-CM | POA: Insufficient documentation

## 2018-02-20 DIAGNOSIS — I4891 Unspecified atrial fibrillation: Secondary | ICD-10-CM | POA: Insufficient documentation

## 2018-02-20 DIAGNOSIS — Z85118 Personal history of other malignant neoplasm of bronchus and lung: Secondary | ICD-10-CM | POA: Diagnosis not present

## 2018-02-20 DIAGNOSIS — J45909 Unspecified asthma, uncomplicated: Secondary | ICD-10-CM | POA: Insufficient documentation

## 2018-02-20 DIAGNOSIS — I480 Paroxysmal atrial fibrillation: Secondary | ICD-10-CM

## 2018-02-20 DIAGNOSIS — Z79899 Other long term (current) drug therapy: Secondary | ICD-10-CM | POA: Insufficient documentation

## 2018-02-20 LAB — BASIC METABOLIC PANEL
ANION GAP: 7 (ref 5–15)
BUN: 12 mg/dL (ref 8–23)
CHLORIDE: 108 mmol/L (ref 98–111)
CO2: 25 mmol/L (ref 22–32)
Calcium: 9.6 mg/dL (ref 8.9–10.3)
Creatinine, Ser: 0.67 mg/dL (ref 0.44–1.00)
GFR calc Af Amer: >60 mL/min (ref >60)
Glucose, Bld: 138 mg/dL — ABNORMAL HIGH (ref 70–99)
POTASSIUM: 4.4 mmol/L (ref 3.5–5.1)
Sodium: 140 mmol/L (ref 135–145)

## 2018-02-20 LAB — CBC
HEMATOCRIT: 41.5 % (ref 36.0–46.0)
HEMOGLOBIN: 14.1 g/dL (ref 12.0–15.0)
MCH: 32.3 pg (ref 26.0–34.0)
MCHC: 34 g/dL (ref 30.0–36.0)
MCV: 95.2 fL (ref 78.0–100.0)
Platelets: 174 10*3/uL (ref 150–400)
RBC: 4.36 MIL/uL (ref 3.87–5.11)
RDW: 13 % (ref 11.5–15.5)
WBC: 5.8 10*3/uL (ref 4.0–10.5)

## 2018-02-20 LAB — I-STAT TROPONIN, ED: Troponin i, poc: 0 ng/mL (ref 0.00–0.08)

## 2018-02-20 MED ORDER — METOPROLOL SUCCINATE ER 25 MG PO TB24: 25.0000 mg | ORAL_TABLET | Freq: Every day | ORAL | 0 refills | Status: DC

## 2018-02-20 MED ORDER — METOPROLOL TARTRATE 25 MG PO TABS
25.0000 mg | ORAL_TABLET | Freq: Once | ORAL | Status: AC
  Administered 2018-02-20: 25 mg via ORAL
  Filled 2018-02-20: qty 1

## 2018-02-20 MED ORDER — METOPROLOL TARTRATE 25 MG PO TABS: 25.0000 mg | ORAL_TABLET | Freq: Two times a day (BID) | ORAL | 0 refills | Status: DC

## 2018-02-20 NOTE — ED Notes (Signed)
Discharge instructions and prescriptions discussed with Pt. Pt verbalized understanding. Pt stable and ambulatory.   

## 2018-02-20 NOTE — ED Triage Notes (Addendum)
Pt presents for evaluation of afib. States was cardioverted in clinic yesterday. States started around 0930 am with chest pressure and tightness with weakness and palpitations. Pt does have pacemaker. No defibrillator. On xarelto.

## 2018-02-20 NOTE — ED Provider Notes (Signed)
[facility] EMERGENCY DEPARTMENT Provider Note   CSN: [id number] Arrival date & time: 02/20/18  1309     History   Chief Complaint Chief Complaint  Patient presents with  . Atrial Fibrillation    HPI Nancy Hodges is a 72 y.o. female.  Pt presents to the ED today with a.fib.  The pt has been in afib since she had a pacemaker placed in 2014.  Worse since her pacemaker was changed in March of 2019.  The pt was cardioverted into NSR yesterday by Dr. Sallyanne Kuster.  The pt felt herself go back into a.fib this morning around 0800.  She feels weak.  No cp or sob.     Past Medical History:  Diagnosis Date  . Allergic rhinitis   . Anxiety   . Arthritis   . Asthma   . Atrial flutter ([place])   . Cholelithiases 09/26/2015  . Diverticulosis   . Encounter for antineoplastic immunotherapy 12/09/2016  . GERD (gastroesophageal reflux disease)   . H/O hiatal hernia   . History of blood transfusion   . History of chemotherapy   . History of kidney stones   . History of radiation therapy   . Internal hemorrhoid   . Lung cancer ([place])    a. Stage IB non-small cell carcinoma, s/p R VATS, wedge resection, RU lobectomy 10/2012.  . Paroxysmal atrial fibrillation ([facility])    a. anticoagulated with Xarelto  . Pneumonia 06/2016   [facility]  . Presence of permanent cardiac pacemaker   . Pulmonary nodule   . Tachycardia-bradycardia syndrome ([place])    a. 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    Patient Active Problem List   Diagnosis Date Noted  . Sick sinus syndrome ([place]) 10/30/2017  . Port-A-Cath in place 07/07/2017  . Seborrheic keratosis 05/12/2017  . Nausea and vomiting 02/19/2017  . Weakness 02/19/2017  . SIRS (systemic inflammatory response syndrome) ([place]) 02/19/2017  . Goals of care, counseling/discussion 12/09/2016  . Encounter for antineoplastic immunotherapy 12/09/2016  . Esophageal varices in other disease   . Hepatic cirrhosis ([place]) 01/09/2016  .  Elevated LFTs 11/07/2015  . Cholelithiases 09/26/2015  . Melena   . GI bleed 07/29/2014  . Lung cancer, primary, with metastasis from lung to other site [facility]) 05/05/2014  . Arthralgia 10/05/2013  . Obstructive chronic bronchitis without exacerbation [facility]  02/11/2013  . Non-small cell carcinoma of right lung, stage 4 ([place]) 09/29/2012  . Tachycardia-bradycardia syndrome ([place]) 09/06/2012  . Paroxysmal atrial fibrillation [facility])     Past Surgical History:  Procedure Laterality Date  . CATARACT EXTRACTION W/PHACO Right 08/14/2016   Procedure: CATARACT EXTRACTION PHACO AND INTRAOCULAR LENS PLACEMENT RIGHT EYE CDE 10.53;  Surgeon: Tonny Branch, MD;  Location: [facility] ORS;  Service: Ophthalmology;  Laterality: Right;  right  . CATARACT EXTRACTION W/PHACO Left 09/25/2016   Procedure: CATARACT EXTRACTION PHACO AND INTRAOCULAR LENS PLACEMENT (IOC);  Surgeon: Tonny Branch, MD;  Location: [facility] ORS;  Service: Ophthalmology;  Laterality: Left;  left CDE 8.81  . COLONOSCOPY     [facility]: cattered sigmoid diverticula, internal hemorrhoids, sessile polyp in ascending and mid transverse colon (tubular adenoma).   . colonoscopy with polypectomy  2015  . ESOPHAGOGASTRODUODENOSCOPY     [facility]: bile reflux in stomach, mild gastritis, hiatal hernia  . ESOPHAGOGASTRODUODENOSCOPY N/A 01/31/2016   Procedure: ESOPHAGOGASTRODUODENOSCOPY (EGD);  Surgeon: [name], MD;  Location: [facility] ENDO SUITE;  Service: Endoscopy;  Laterality: N/A;  1200  . IR FLUORO GUIDE PORT INSERTION RIGHT  06/30/2017  . IR US GUIDE VASC ACCESS RIGHT  06/30/2017  . LOBECTOMY Right 10/27/2012   Procedure: LOBECTOMY;  Surgeon: [name], MD;  Location: [facility];  Service: Thoracic;  Laterality: Right;   RIGHT UPPER LOBECTOMY & Node Dissection  . LYMPH NODE BIOPSY Right 05/01/2014   Procedure: LYMPH NODE BIOPSY, Right Axillary;  Surgeon: [name], MD;  Location: [place];  Service: Thoracic;  Laterality: Right;  . PACEMAKER IMPLANT N/A  10/30/2017   SJM Assurity MRI PPM implanted by Dr [name] once leadless pacemaker battery depleted  . PARTIAL HYSTERECTOMY    . PERMANENT PACEMAKER INSERTION N/A 09/14/2012   Nanostim (SJM) leadless pacemaker (LEADLESS II STUDY PATIENT) implanted by Dr [name], no longer functioning, device abandoned.  [name] VIDEO ASSISTED THORACOSCOPY (VATS)/WEDGE RESECTION Right 10/27/2012   Procedure: VIDEO ASSISTED THORACOSCOPY (VATS),RGHT UPPER LOBE LUNG WEDGE RESECTION;  Surgeon: [name], MD;  Location: [facility] OR;  Service: Thoracic;  Laterality: Right;     OB History    Gravida  1   Para  1   Term  1   Preterm      AB      Living        SAB      TAB      Ectopic      Multiple      Live Births               Home Medications    Prior to Admission medications   Medication Sig Start Date End Date Taking? Authorizing Provider  acetaminophen (TYLENOL) 500 MG tablet Take 500 mg by mouth every 6 (six) hours as needed for moderate pain or fever.     [provider]  albuterol (PROVENTIL HFA;VENTOLIN HFA) 108 (90 BASE) MCG/ACT inhaler Inhale 1-2 puffs into the lungs every 6 (six) hours as needed for wheezing or shortness of breath.     [provider]  diltiazem (CARDIZEM CD) 240 MG 24 hr capsule Take 1 capsule (240 mg total) by mouth daily. 03/30/17   Allred, Jeneen Rinks, MD  hydroxypropyl methylcellulose / hypromellose (ISOPTO TEARS / GONIOVISC) 2.5 % ophthalmic solution Place 1-2 drops into the left eye as needed for dry eyes.     [provider]  ipratropium-albuterol (DUONEB) 0.5-2.5 (3) MG/3ML SOLN Take 3 mLs by nebulization every 6 (six) hours as needed (shortness of breath or wheezing).     [provider]  lidocaine-prilocaine (EMLA) cream Apply 1 application topically as needed. Patient taking differently: Apply 1 application topically as needed (for port).  07/03/17   Curt Bears, MD  loratadine (CLARITIN) 10 MG tablet Take 10 mg by mouth  daily.    [provider]  meclizine (ANTIVERT) 12.5 MG tablet Take 12.5 mg by mouth 3 (three) times daily as needed for dizziness. Takes 1/2 tablet tid as needed.     [provider]  ondansetron (ZOFRAN ODT) 4 MG disintegrating tablet Take 1 tablet (4 mg total) by mouth every 8 (eight) hours as needed for nausea or vomiting. 09/13/17   Francine Graven, DO  rivaroxaban (XARELTO) 20 MG TABS tablet Take 1 tablet (20 mg total) by mouth daily with supper. 03/30/17   [name], MD    Family History Family History  Problem Relation Age of Onset  . Heart disease Mother   . Diabetes Mother   . Kidney cancer Mother   . Asthma Father   . Heart disease Father   . Pancreatic cancer  Brother   . Diabetes Sister   . Lung cancer Sister   . Diabetes Brother   . Heart attack Son   . Colon cancer Neg Hx     Social History Social History   Tobacco Use  . Smoking status: Former Smoker    Packs/day: 2.00    Years: 40.00    Pack years: 80.00    Types: Cigarettes    Last attempt to quit: 08/05/1995    Years since quitting: 22.5  . Smokeless tobacco: Never Used  Substance Use Topics  . Alcohol use: No  . Drug use: No     Allergies   Chlorpheniramine-dm; Prednisone; Septra [sulfamethoxazole-trimethoprim]; Penicillins; and Vioxx [rofecoxib]   Review of Systems Review of Systems  Cardiovascular: Positive for palpitations.  All other systems reviewed and are negative.    Physical Exam Updated Vital Signs BP (!) 120/56   Pulse 75   Temp 98.1 F (36.7 C) (Oral)   Resp 20   Ht 5\' 2"  (1.575 m)   Wt 68 kg (150 lb)   SpO2 95%   BMI 27.44 kg/m   Physical Exam  Constitutional: She is oriented to person, place, and time. She appears well-developed and well-nourished.  HENT:  Head: Normocephalic and atraumatic.  Right Ear: External ear normal.  Left Ear: External ear normal.  Nose: Nose normal.  Mouth/Throat: Oropharynx is clear and moist.  Eyes: Pupils are  equal, round, and reactive to light. Conjunctivae and EOM are normal.  Neck: Normal range of motion. Neck supple.  Cardiovascular: Normal rate, normal heart sounds and intact distal pulses. An irregularly irregular rhythm present.  Pulmonary/Chest: Effort normal and breath sounds normal.  Abdominal: Soft. Bowel sounds are normal.  Musculoskeletal: Normal range of motion.  Neurological: She is alert and oriented to person, place, and time.  Skin: Skin is warm. Capillary refill takes less than 2 seconds.  Psychiatric: She has a normal mood and affect. Her behavior is normal. Judgment and thought content normal.  Nursing note and vitals reviewed.    ED Treatments / Results  Labs (all labs ordered are listed, but only abnormal results are displayed) Labs Reviewed  BASIC METABOLIC PANEL - Abnormal; Notable for the following components:      Result Value   Glucose, Bld 138 (*)    All other components within normal limits  CBC  I-STAT TROPONIN, ED    EKG None  Radiology Dg Chest 2 View  Result Date: 02/20/2018 CLINICAL DATA:  Pt is her for afib, pt went into an afib episode yesterday and was cardioverted back into rhythm. Pt came to ED today for another afib episode. Pt also reports feeling weak. EXAM: CHEST - 2 VIEW COMPARISON:  01/08/2018 FINDINGS: Port in the anterior chest wall with tip in distal SVC. LEFT-sided pacer over normal cardiac silhouette. Small RIGHT effusion. LEFT lung base clear. Upper lungs clear. Lungs are hyperinflated IMPRESSION: 1. Hyperinflated lungs without acute findings. 2. Small RIGHT effusion. Electronically Signed   By: Suzy Bouchard M.D.   On: 02/20/2018 14:00    Procedures Procedures (including critical care time)  Medications Ordered in ED Medications  metoprolol tartrate (LOPRESSOR) tablet 25 mg (has no administration in time range)     Initial Impression / Assessment and Plan / ED Course  I have reviewed the triage vital signs and the nursing  notes.  Pertinent labs & imaging results that were available during my care of the patient were reviewed by me and considered in my  medical decision making (see chart for details).    Pt d/w Dr. Claiborne Billings (cardiology).  He recommended adding metoprolol 25 mg bid and first dose to be given here.  No additional cardioversion attempts here.  Pt instructed to f/u with cards.  Return if worse.  Final Clinical Impressions(s) / ED Diagnoses   Final diagnoses:  Paroxysmal atrial fibrillation [facility])    ED Discharge Orders    None       Isla Pence, MD 02/20/18 1555

## 2018-02-22 ENCOUNTER — Encounter (HOSPITAL_COMMUNITY): Payer: Self-pay | Admitting: Cardiovascular Disease

## 2018-02-22 ENCOUNTER — Telehealth: Payer: Self-pay | Admitting: Internal Medicine

## 2018-02-22 NOTE — Telephone Encounter (Signed)
Pt calling back and stating no one has called her back concerning her message this morning. Please call pt.

## 2018-02-22 NOTE — Telephone Encounter (Signed)
Talked with patient - she doesn't want to change anything right now. She is back in afib and just took the metoprolol with food. She will call back if she wants to move up the appointment.

## 2018-02-22 NOTE — Telephone Encounter (Signed)
Patient calling and complaining of feeling weak and having nausea while taking metoprolol. Patient stated her SBP 112 to 133 and HR 61-71. Dr. [name] is out this week. Patient will be seeing Roderic Palau NP on 03/03/18 to follow up after Cardioversion. Patient stated she is back in NSR at this time.  Will send message to Roderic Palau NP for advisement.   Call patient back at [phone]- cell phone.

## 2018-02-22 NOTE — Telephone Encounter (Signed)
New Message   Pt calling, states she had her heart shocked on 7/19 and went back into afib on 7/20 and she would like to speak to a nurse

## 2018-02-26 ENCOUNTER — Emergency Department (HOSPITAL_COMMUNITY)
Admission: EM | Admit: 2018-02-26 | Discharge: 2018-02-26 | Disposition: A | Discharge status: Home / Self Care | Payer: Medicare Other | Attending: Emergency Medicine | Admitting: Emergency Medicine

## 2018-02-26 ENCOUNTER — Other Ambulatory Visit: Payer: Self-pay

## 2018-02-26 ENCOUNTER — Emergency Department (HOSPITAL_COMMUNITY): Payer: Medicare Other

## 2018-02-26 ENCOUNTER — Encounter (HOSPITAL_COMMUNITY): Payer: Self-pay | Admitting: Emergency Medicine

## 2018-02-26 DIAGNOSIS — Z79899 Other long term (current) drug therapy: Secondary | ICD-10-CM | POA: Diagnosis not present

## 2018-02-26 DIAGNOSIS — Z87891 Personal history of nicotine dependence: Secondary | ICD-10-CM | POA: Diagnosis not present

## 2018-02-26 DIAGNOSIS — J45909 Unspecified asthma, uncomplicated: Secondary | ICD-10-CM | POA: Diagnosis not present

## 2018-02-26 DIAGNOSIS — R0981 Nasal congestion: Secondary | ICD-10-CM | POA: Insufficient documentation

## 2018-02-26 NOTE — ED Notes (Addendum)
Pt reports sinus problems "since forever"   Sinus pressure since last PM with feeling of mucous to throat last night which made her "heave"  Has appt with ENT next week arranged by her [facility] doctor per pt report  Pt report cardioversion last week

## 2018-02-26 NOTE — ED Provider Notes (Signed)
Medical screening examination/treatment/procedure(s) were conducted as a shared visit with non-physician practitioner(s) and myself.  I personally evaluated the patient during the encounter.   He was shortness of breath secondary to the postnasal drainage over the last couple days.  States his been coughing some as well.  Does not have any chest pain.  None nausea or vomiting.  Has had symptoms like this before.  Has not done anything for the symptoms.  My exam she has No facial tenderness.  Open airways.  Clear lung sounds.  Normal vital signs. Likely viral sinusitis this point.  She has ENT appointment.  No indication for antibiotics or further work-up.   Merrily Pew, MD 02/26/18 [phone]

## 2018-02-26 NOTE — Discharge Instructions (Signed)
Your vital signs are within normal limits.  Your oxygen level is 98% on room air.  Within normal limits by my interpretation.  Please increase your fluids.  Please position the height of the head of your bed to improve the problem with sinus drainage.  Please keep your appointment with the ear nose and throat specialist as scheduled.  Please return to the emergency department if any changes in your condition, problems, or concerns.

## 2018-02-26 NOTE — ED Triage Notes (Signed)
Patient complaining of facial pain into bilateral ears and throat and drainage since last night. States she has coughed up green sputum. Also complaining of nausea.

## 2018-02-26 NOTE — ED Provider Notes (Signed)
[facility] EMERGENCY DEPARTMENT Provider Note   CSN: [id number] Arrival date & time: 02/26/18  1513     History   Chief Complaint Chief Complaint  Patient presents with  . Facial Pain    HPI Nancy Hodges is a 72 y.o. female.  Patient is a 72 year old female who presents to the emergency department with a complaint of congestion and facial pain.  Patient states that she has a long history of sinus related problems.  She has been scheduled to see an ear nose and throat specialist on next week.  On last night she developed mucus buildup in her nasal passages, pressure behind her eyes and pressure on the bones of her jaw, and some facial pain.  She says she was experiencing drainage, and was gradually able to move the secretions by coughing and hacking, as well as blowing her nose.  She says that she was up most of the night because of this.  Today she feels a lot better, but wanted to be evaluated for this congestion.  It is of note that the patient has atrial flutter and atrial fibrillation.  She was recently placed on beta-blocker, and she states since that time her atrial fibrillation seems to have been under control.  She denies chest pain, unusual shortness of breath, or unusual sweats, and no loss of consciousness during these episodes.   The history is provided by the patient.    Past Medical History:  Diagnosis Date  . Allergic rhinitis   . Anxiety   . Arthritis   . Asthma   . Atrial flutter ([place])   . Cholelithiases 09/26/2015  . Diverticulosis   . Encounter for antineoplastic immunotherapy 12/09/2016  . GERD (gastroesophageal reflux disease)   . H/O hiatal hernia   . History of blood transfusion   . History of chemotherapy   . History of kidney stones   . History of radiation therapy   . Internal hemorrhoid   . Lung cancer ([place])    a. Stage IB non-small cell carcinoma, s/p R VATS, wedge resection, RU lobectomy 10/2012.  . Paroxysmal atrial fibrillation ([facility])      a. anticoagulated with Xarelto  . Pneumonia 06/2016   [facility]  . Presence of permanent cardiac pacemaker   . Pulmonary nodule   . Tachycardia-bradycardia syndrome ([place])    a. 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    Patient Active Problem List   Diagnosis Date Noted  . Sick sinus syndrome ([place]) 10/30/2017  . Port-A-Cath in place 07/07/2017  . Seborrheic keratosis 05/12/2017  . Nausea and vomiting 02/19/2017  . Weakness 02/19/2017  . SIRS (systemic inflammatory response syndrome) ([place]) 02/19/2017  . Goals of care, counseling/discussion 12/09/2016  . Encounter for antineoplastic immunotherapy 12/09/2016  . Esophageal varices in other disease   . Hepatic cirrhosis ([place]) 01/09/2016  . Elevated LFTs 11/07/2015  . Cholelithiases 09/26/2015  . Melena   . GI bleed 07/29/2014  . Lung cancer, primary, with metastasis from lung to other site [facility]) 05/05/2014  . Arthralgia 10/05/2013  . Obstructive chronic bronchitis without exacerbation Gold A  02/11/2013  . Non-small cell carcinoma of right lung, stage 4 ([place]) 09/29/2012  . Tachycardia-bradycardia syndrome ([place]) 09/06/2012  . Paroxysmal atrial fibrillation [facility])     Past Surgical History:  Procedure Laterality Date  . CARDIOVERSION N/A 02/19/2018   Procedure: CARDIOVERSION;  Surgeon: Sanda Klein, MD;  Location: [place];  Service: Cardiovascular;  Laterality: N/A;  . CATARACT EXTRACTION W/PHACO Right 08/14/2016  Procedure: CATARACT EXTRACTION PHACO AND INTRAOCULAR LENS PLACEMENT RIGHT EYE CDE 10.53;  Surgeon: Tonny Branch, MD;  [facility];  Service: Ophthalmology;  Laterality: Right;  right  . CATARACT EXTRACTION W/PHACO Left 09/25/2016   Procedure: CATARACT EXTRACTION PHACO AND INTRAOCULAR LENS PLACEMENT (IOC);  Surgeon: Tonny Branch, MD;  [facility];  Service: Ophthalmology;  Laterality: Left;  left CDE 8.81  . COLONOSCOPY     [facility]: cattered sigmoid diverticula, internal hemorrhoids, sessile polyp  in ascending and mid transverse colon (tubular adenoma).   . colonoscopy with polypectomy  2015  . ESOPHAGOGASTRODUODENOSCOPY     [facility]: bile reflux in stomach, mild gastritis, hiatal hernia  . ESOPHAGOGASTRODUODENOSCOPY N/A 01/31/2016   Procedure: ESOPHAGOGASTRODUODENOSCOPY (EGD);  Surgeon: [name], MD;  [facility];  Service: Endoscopy;  Laterality: N/A;  1200  . IR FLUORO GUIDE PORT INSERTION RIGHT  06/30/2017  . IR US GUIDE VASC ACCESS RIGHT  06/30/2017  . LOBECTOMY Right 10/27/2012   Procedure: LOBECTOMY;  Surgeon: [name], MD;  Location: [place];  Service: Thoracic;  Laterality: Right;   RIGHT UPPER LOBECTOMY & Node Dissection  . LYMPH NODE BIOPSY Right 05/01/2014   Procedure: LYMPH NODE BIOPSY, Right Axillary;  Surgeon: [name], MD;  Location: [place];  Service: Thoracic;  Laterality: Right;  . PACEMAKER IMPLANT N/A 10/30/2017   SJM Assurity MRI PPM implanted by Dr [name] once leadless pacemaker battery depleted  . PARTIAL HYSTERECTOMY    . PERMANENT PACEMAKER INSERTION N/A 09/14/2012   Nanostim (SJM) leadless pacemaker (LEADLESS II STUDY PATIENT) implanted by Dr [name], no longer functioning, device abandoned.  [name] VIDEO ASSISTED THORACOSCOPY (VATS)/WEDGE RESECTION Right 10/27/2012   Procedure: VIDEO ASSISTED THORACOSCOPY (VATS),RGHT UPPER LOBE LUNG WEDGE RESECTION;  Surgeon: [name], MD;  [facility];  Service: Thoracic;  Laterality: Right;     OB History    Gravida  1   Para  1   Term  1   Preterm      AB      Living        SAB      TAB      Ectopic      Multiple      Live Births               Home Medications    Prior to Admission medications   Medication Sig Start Date End Date Taking? Authorizing Provider  acetaminophen (TYLENOL) 500 MG tablet Take 500 mg by mouth every 6 (six) hours as needed for moderate pain or fever.     [provider]  albuterol (PROVENTIL HFA;VENTOLIN HFA) 108 (90  BASE) MCG/ACT inhaler Inhale 1-2 puffs into the lungs every 6 (six) hours as needed for wheezing or shortness of breath.     [provider]  diltiazem (CARDIZEM CD) 240 MG 24 hr capsule Take 1 capsule (240 mg total) by mouth daily. 03/30/17   Allred, Jeneen Rinks, MD  hydroxypropyl methylcellulose / hypromellose (ISOPTO TEARS / GONIOVISC) 2.5 % ophthalmic solution Place 1-2 drops into the left eye as needed for dry eyes.     [provider]  ipratropium-albuterol (DUONEB) 0.5-2.5 (3) MG/3ML SOLN Take 3 mLs by nebulization every 6 (six) hours as needed (shortness of breath or wheezing).     [provider]  lidocaine-prilocaine (EMLA) cream Apply 1 application topically as needed. Patient taking differently: Apply 1 application topically as needed (for port).  07/03/17   Curt Bears, MD  loratadine (CLARITIN) 10 MG tablet Take 10 mg by mouth daily.    [provider]  meclizine (ANTIVERT) 12.5 MG tablet Take 12.5 mg by mouth 3 (three) times daily as needed for dizziness. Takes 1/2 tablet tid as needed.     [provider]  metoprolol tartrate (LOPRESSOR) 25 MG tablet Take 1 tablet (25 mg total) by mouth 2 (two) times daily. 02/20/18   Isla Pence, MD  ondansetron (ZOFRAN ODT) 4 MG disintegrating tablet Take 1 tablet (4 mg total) by mouth every 8 (eight) hours as needed for nausea or vomiting. 09/13/17   Francine Graven, DO  rivaroxaban (XARELTO) 20 MG TABS tablet Take 1 tablet (20 mg total) by mouth daily with supper. 03/30/17   [name], MD    Family History Family History  Problem Relation Age of Onset  . Heart disease Mother   . Diabetes Mother   . Kidney cancer Mother   . Asthma Father   . Heart disease Father   . Pancreatic cancer Brother   . Diabetes Sister   . Lung cancer Sister   . Diabetes Brother   . Heart attack Son   . Colon cancer Neg Hx     Social History Social History   Tobacco Use  . Smoking status: Former Smoker     Packs/day: 2.00    Years: 40.00    Pack years: 80.00    Types: Cigarettes    Last attempt to quit: 08/05/1995    Years since quitting: 22.5  . Smokeless tobacco: Never Used  Substance Use Topics  . Alcohol use: No  . Drug use: No     Allergies   Chlorpheniramine-dm; Prednisone; Septra [sulfamethoxazole-trimethoprim]; Penicillins; and Vioxx [rofecoxib]   Review of Systems Review of Systems  Constitutional: Negative for activity change.       All ROS Neg except as noted in HPI  HENT: Positive for congestion and ear pain. Negative for nosebleeds.   Eyes: Negative for photophobia and discharge.  Respiratory: Positive for cough. Negative for shortness of breath and wheezing.   Cardiovascular: Negative for chest pain and palpitations.  Gastrointestinal: Negative for abdominal pain and blood in stool.  Genitourinary: Negative for dysuria, frequency and hematuria.  Musculoskeletal: Negative for arthralgias, back pain and neck pain.  Skin: Negative.   Neurological: Negative for dizziness, seizures and speech difficulty.  Psychiatric/Behavioral: Negative for confusion and hallucinations.     Physical Exam Updated Vital Signs BP (!) 135/54 (BP Location: Left Arm)   Pulse 70   Temp 98.5 F (36.9 C) (Oral)   Resp 20   Ht 5\' 2"  (1.575 m)   Wt 68 kg (150 lb)   SpO2 98%   BMI 27.44 kg/m   Physical Exam  Constitutional: She is oriented to person, place, and time. She appears well-developed and well-nourished.  Non-toxic appearance.  HENT:  Head: Normocephalic.  Right Ear: Tympanic membrane and external ear normal.  Left Ear: Tympanic membrane and external ear normal.  No pain to percussion over the sinuses.  No hot areas appreciated.  Oropharynx is clear.  Speech is clear.  There is no mastoid involvement.  Eyes: Pupils are equal, round, and reactive to light. EOM and lids are normal.  Neck: Normal range of motion. Neck supple. Carotid bruit is not present.  Cardiovascular:  Normal rate, regular rhythm, normal heart sounds, intact distal pulses and normal pulses.  Pulmonary/Chest: Breath sounds normal. No respiratory distress.  Abdominal: Soft. Bowel sounds are normal. There is no  tenderness. There is no guarding.  Musculoskeletal: Normal range of motion.  Lymphadenopathy:       Head (right side): No submandibular adenopathy present.       Head (left side): No submandibular adenopathy present.    She has no cervical adenopathy.  Neurological: She is alert and oriented to person, place, and time. She has normal strength. No cranial nerve deficit or sensory deficit.  Skin: Skin is warm and dry.  Psychiatric: She has a normal mood and affect. Her speech is normal.  Nursing note and vitals reviewed.    ED Treatments / Results  Labs (all labs ordered are listed, but only abnormal results are displayed) Labs Reviewed - No data to display  EKG None  Radiology Dg Chest 2 View  Result Date: 02/26/2018 CLINICAL DATA:  Cough EXAM: CHEST - 2 VIEW COMPARISON:  02/20/2018 FINDINGS: Cardiac shadows within normal limits. Pacing device is again identified and stable. Lead less pacemaker is noted as well stable from the prior exam. Right chest wall port is seen. Postsurgical changes in the right hilum are noted. The lungs are well aerated bilaterally. Chronic scarring in the right base is noted. No acute abnormality is seen. IMPRESSION: Chronic changes without acute abnormality. Electronically Signed   By: Inez Catalina M.D.   On: 02/26/2018 16:05    Procedures Procedures (including critical care time)  Medications Ordered in ED Medications - No data to display   Initial Impression / Assessment and Plan / ED Course  I have reviewed the triage vital signs and the nursing notes.  Pertinent labs & imaging results that were available during my care of the patient were reviewed by me and considered in my medical decision making (see chart for details).       Final  Clinical Impressions(s) / ED Diagnoses MDM  Vital signs are within normal limits.  Pulse oximetry is 98% on room air.  Within normal limits by my interpretation.  The patient speaks in complete sentences without problem.there is no pain to percussion over the sinuses at this time, and there are no hot areas over the sinuses.  The patient has some soreness of the bones of her upper  Jaw.patient states she feels much better now than she did earlier.  I have discussed with the patient the findings on the examination.  I have attempted to reassure her that her examination is within normal limits at this time.  Of asked patient to increase fluids to hopefully decrease the thickness of the mucus.  I have asked her to reposition her bed for decrease in the drainage of her sinuses.  I have encouraged her to keep her appointment with the ear nose and throat specialist.   Final diagnoses:  Sinus congestion    ED Discharge Orders    None       Lily Kocher, PA-C 02/26/18 1727    Mesner, Corene Cornea, MD 02/26/18 2334

## 2018-03-02 ENCOUNTER — Other Ambulatory Visit: Payer: Medicare Other

## 2018-03-02 ENCOUNTER — Ambulatory Visit: Payer: Medicare Other

## 2018-03-02 ENCOUNTER — Ambulatory Visit: Payer: Medicare Other | Admitting: Internal Medicine

## 2018-03-03 ENCOUNTER — Other Ambulatory Visit: Payer: Self-pay

## 2018-03-03 ENCOUNTER — Encounter (HOSPITAL_COMMUNITY): Payer: Self-pay | Admitting: Nurse Practitioner

## 2018-03-03 ENCOUNTER — Ambulatory Visit (HOSPITAL_COMMUNITY)
Admission: RE | Admit: 2018-03-03 | Discharge: 2018-03-03 | Disposition: A | Discharge status: Home / Self Care | Payer: Medicare Other | Source: Ambulatory Visit | Attending: Nurse Practitioner | Admitting: Nurse Practitioner

## 2018-03-03 VITALS — BP 116/72 | HR 68 | Ht 62.0 in | Wt 150.0 lb

## 2018-03-03 DIAGNOSIS — Z87442 Personal history of urinary calculi: Secondary | ICD-10-CM | POA: Diagnosis not present

## 2018-03-03 DIAGNOSIS — I480 Paroxysmal atrial fibrillation: Secondary | ICD-10-CM | POA: Insufficient documentation

## 2018-03-03 DIAGNOSIS — Z882 Allergy status to sulfonamides status: Secondary | ICD-10-CM | POA: Insufficient documentation

## 2018-03-03 DIAGNOSIS — Z7901 Long term (current) use of anticoagulants: Secondary | ICD-10-CM | POA: Insufficient documentation

## 2018-03-03 DIAGNOSIS — I4819 Other persistent atrial fibrillation: Secondary | ICD-10-CM

## 2018-03-03 DIAGNOSIS — K449 Diaphragmatic hernia without obstruction or gangrene: Secondary | ICD-10-CM | POA: Diagnosis not present

## 2018-03-03 DIAGNOSIS — Z8051 Family history of malignant neoplasm of kidney: Secondary | ICD-10-CM | POA: Insufficient documentation

## 2018-03-03 DIAGNOSIS — I4892 Unspecified atrial flutter: Secondary | ICD-10-CM | POA: Diagnosis not present

## 2018-03-03 DIAGNOSIS — Z800 Family history of malignant neoplasm of digestive organs: Secondary | ICD-10-CM | POA: Diagnosis not present

## 2018-03-03 DIAGNOSIS — Z950 Presence of cardiac pacemaker: Secondary | ICD-10-CM | POA: Insufficient documentation

## 2018-03-03 DIAGNOSIS — Z808 Family history of malignant neoplasm of other organs or systems: Secondary | ICD-10-CM | POA: Diagnosis not present

## 2018-03-03 DIAGNOSIS — I481 Persistent atrial fibrillation: Secondary | ICD-10-CM | POA: Insufficient documentation

## 2018-03-03 DIAGNOSIS — Z87891 Personal history of nicotine dependence: Secondary | ICD-10-CM | POA: Diagnosis not present

## 2018-03-03 DIAGNOSIS — I495 Sick sinus syndrome: Secondary | ICD-10-CM | POA: Insufficient documentation

## 2018-03-03 DIAGNOSIS — K219 Gastro-esophageal reflux disease without esophagitis: Secondary | ICD-10-CM | POA: Insufficient documentation

## 2018-03-03 DIAGNOSIS — Z801 Family history of malignant neoplasm of trachea, bronchus and lung: Secondary | ICD-10-CM | POA: Insufficient documentation

## 2018-03-03 DIAGNOSIS — Z79899 Other long term (current) drug therapy: Secondary | ICD-10-CM | POA: Diagnosis not present

## 2018-03-03 DIAGNOSIS — F419 Anxiety disorder, unspecified: Secondary | ICD-10-CM | POA: Diagnosis not present

## 2018-03-03 DIAGNOSIS — Z880 Allergy status to penicillin: Secondary | ICD-10-CM | POA: Insufficient documentation

## 2018-03-03 DIAGNOSIS — I4891 Unspecified atrial fibrillation: Secondary | ICD-10-CM | POA: Diagnosis present

## 2018-03-03 DIAGNOSIS — Z8249 Family history of ischemic heart disease and other diseases of the circulatory system: Secondary | ICD-10-CM | POA: Insufficient documentation

## 2018-03-03 DIAGNOSIS — J45909 Unspecified asthma, uncomplicated: Secondary | ICD-10-CM | POA: Diagnosis not present

## 2018-03-03 DIAGNOSIS — Z888 Allergy status to other drugs, medicaments and biological substances status: Secondary | ICD-10-CM | POA: Diagnosis not present

## 2018-03-03 DIAGNOSIS — Z85118 Personal history of other malignant neoplasm of bronchus and lung: Secondary | ICD-10-CM | POA: Diagnosis not present

## 2018-03-03 MED ORDER — METOPROLOL TARTRATE 25 MG PO TABS: 12.5000 mg | ORAL_TABLET | Freq: Two times a day (BID) | ORAL | 0 refills | Status: DC

## 2018-03-03 NOTE — Progress Notes (Signed)
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 female with a h/o PPM, afib, lung CA, that recently had a cardioversion as scheduled by Dr. [name]. It was successful but unfortunately, it lasted only 24 hours. She went to the hospital when she realized she was back in afib. Her v rates were in the 80's. She was given metoprolol 25 mg bid . She reports today that she does not feel as well on metoprolol and has noted some low BP readings, weakness and fatigue. She did not stay out long enough to appreciate if she felt better or not in SR. She has been in persistent afib for several months. She is still undergoing treatment for lung cancer.   Today, she denies symptoms of palpitations, chest pain, shortness of breath, orthopnea, PND, lower extremity edema, dizziness, presyncope, syncope, or neurologic sequela. + for fatigue. The patient is tolerating medications without difficulties and is otherwise without complaint today.   Past Medical History:  Diagnosis Date  . Allergic rhinitis   . Anxiety   . Arthritis   . Asthma   . Atrial flutter ([place])   . Cholelithiases 09/26/2015  . Diverticulosis   . Encounter for antineoplastic immunotherapy 12/09/2016  . GERD (gastroesophageal reflux disease)   . H/O hiatal hernia   . History of blood transfusion   . History of chemotherapy   . History of kidney stones   . History of radiation therapy   . Internal hemorrhoid   . Lung cancer ([place])    a. Stage IB non-small cell carcinoma, s/p R VATS, wedge resection, RU lobectomy 10/2012.  . Paroxysmal atrial fibrillation ([facility])    a. anticoagulated with Xarelto  . Pneumonia 06/2016   [facility]  . Presence of permanent cardiac pacemaker   . Pulmonary nodule   . Tachycardia-bradycardia syndrome ([place])    a. 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   Past Surgical History:  Procedure Laterality Date  . CARDIOVERSION N/A 02/19/2018   Procedure: CARDIOVERSION;  Surgeon: Sanda Klein, MD;  [facility];  Service: Cardiovascular;  Laterality: N/A;  . CATARACT EXTRACTION W/PHACO Right 08/14/2016   Procedure: CATARACT EXTRACTION PHACO AND INTRAOCULAR LENS PLACEMENT RIGHT EYE CDE 10.53;  Surgeon: Tonny Branch, MD;  [facility];  Service: Ophthalmology;  Laterality: Right;  right  . CATARACT EXTRACTION W/PHACO Left 09/25/2016   Procedure: CATARACT EXTRACTION PHACO AND INTRAOCULAR LENS PLACEMENT (IOC);  Surgeon: Tonny Branch, MD;  [facility];  Service: Ophthalmology;  Laterality: Left;  left CDE 8.81  . COLONOSCOPY     [facility]: cattered sigmoid diverticula, internal hemorrhoids, sessile polyp in ascending and mid transverse colon (tubular adenoma).   . colonoscopy with polypectomy  2015  . ESOPHAGOGASTRODUODENOSCOPY     [facility]: bile reflux in stomach, mild gastritis, hiatal hernia  . ESOPHAGOGASTRODUODENOSCOPY N/A 01/31/2016   Procedure: ESOPHAGOGASTRODUODENOSCOPY (EGD);  Surgeon: [name], MD;  [facility];  Service: Endoscopy;  Laterality: N/A;  1200  . IR FLUORO GUIDE PORT INSERTION RIGHT  06/30/2017  . IR US GUIDE VASC ACCESS RIGHT  06/30/2017  . LOBECTOMY Right 10/27/2012   Procedure: LOBECTOMY;  Surgeon: [name], MD;  Location: [place];  Service: Thoracic;  Laterality: Right;   RIGHT UPPER LOBECTOMY & Node Dissection  . LYMPH NODE BIOPSY Right 05/01/2014   Procedure: LYMPH NODE BIOPSY, Right Axillary;  Surgeon: [name], MD;  Location: [place];  Service: Thoracic;  Laterality:  Right;  [name] PACEMAKER IMPLANT N/A 10/30/2017   SJM Assurity MRI PPM implanted by Dr [name] once leadless pacemaker battery depleted  . PARTIAL HYSTERECTOMY    . PERMANENT PACEMAKER INSERTION N/A 09/14/2012   Nanostim (SJM) leadless pacemaker (LEADLESS II STUDY PATIENT) implanted by Dr [name], no longer functioning, device abandoned.  [name] VIDEO ASSISTED THORACOSCOPY (VATS)/WEDGE RESECTION Right  10/27/2012   Procedure: VIDEO ASSISTED THORACOSCOPY (VATS),RGHT UPPER LOBE LUNG WEDGE RESECTION;  Surgeon: [name], MD;  Location: [place];  Service: Thoracic;  Laterality: Right;    Current Outpatient Medications  Medication Sig Dispense Refill  . acetaminophen (TYLENOL) 500 MG tablet Take 500 mg by mouth every 6 (six) hours as needed for moderate pain or fever.     [name] albuterol (PROVENTIL HFA;VENTOLIN HFA) 108 (90 BASE) MCG/ACT inhaler Inhale 1-2 puffs into the lungs every 6 (six) hours as needed for wheezing or shortness of breath.     . diltiazem (CARDIZEM CD) 240 MG 24 hr capsule Take 1 capsule (240 mg total) by mouth daily. 90 capsule 3  . hydroxypropyl methylcellulose / hypromellose (ISOPTO TEARS / GONIOVISC) 2.5 % ophthalmic solution Place 1-2 drops into the left eye as needed for dry eyes.     [name] ipratropium-albuterol (DUONEB) 0.5-2.5 (3) MG/3ML SOLN Take 3 mLs by nebulization every 6 (six) hours as needed (shortness of breath or wheezing).     [name] lidocaine-prilocaine (EMLA) cream Apply 1 application topically as needed. (Patient taking differently: Apply 1 application topically as needed (for port). ) 30 g 4  . loratadine (CLARITIN) 10 MG tablet Take 10 mg by mouth daily.    . meclizine (ANTIVERT) 12.5 MG tablet Take 12.5 mg by mouth 3 (three) times daily as needed for dizziness. Takes 1/2 tablet tid as needed.     . metoprolol tartrate (LOPRESSOR) 25 MG tablet Take 0.5 tablets (12.5 mg total) by mouth 2 (two) times daily. 60 tablet 0  . ondansetron (ZOFRAN ODT) 4 MG disintegrating tablet Take 1 tablet (4 mg total) by mouth every 8 (eight) hours as needed for nausea or vomiting. 6 tablet 0  . rivaroxaban (XARELTO) 20 MG TABS tablet Take 1 tablet (20 mg total) by mouth daily with supper. 30 tablet 11   No current facility-administered medications for this encounter.     Allergies  Allergen Reactions  . Chlorpheniramine-Dm Anaphylaxis and Other (See Comments)    Pt states BP  dropped; developed irregular heartbeat   . Prednisone Shortness Of Breath, Palpitations and Other (See Comments)    Ddouble vision. History is not consistent with any allergy.  Sarina Ill [Sulfamethoxazole-Trimethoprim] Shortness Of Breath  . Penicillins Swelling, Rash and Other (See Comments)    Has patient had a PCN reaction causing immediate rash, facial/tongue/throat swelling, SOB or lightheadedness with hypotension: Yes Has patient had a PCN reaction causing severe rash involving mucus membranes or skin necrosis: No Has patient had a PCN reaction that required hospitalization No Has patient had a PCN reaction occurring within the last 10 years: No If all of the above answers are "NO", then may proceed with Cephalosporin use.   . Vioxx [Rofecoxib] Other (See Comments)    Stomach cramps    Social History   Socioeconomic History  . Marital status: Single    Spouse name: Not on file  . Number of children: Not on file  . Years of education: Not on file  . Highest education level: Not on file  Occupational History  . Not on file  Social Needs  . Financial resource strain: Not on file  . Food insecurity:    Worry: Not on file    Inability: Not on file  . Transportation needs:    Medical: Not on file    Non-medical: Not on file  Tobacco Use  . Smoking status: Former Smoker    Packs/day: 2.00    Years: 40.00    Pack years: 80.00    Types: Cigarettes    Last attempt to quit: 08/05/1995    Years since quitting: 22.5  . Smokeless tobacco: Never Used  Substance and Sexual Activity  . Alcohol use: No  . Drug use: No  . Sexual activity: Not Currently  Lifestyle  . Physical activity:    Days per week: Patient refused    Minutes per session: Patient refused  . Stress: Patient refused  Relationships  . Social connections:    Talks on phone: Patient refused    Gets together: Patient refused    Attends religious service: Patient refused    Active member of club or organization:  Patient refused    Attends meetings of clubs or organizations: Patient refused    Relationship status: Patient refused  . Intimate partner violence:    Fear of current or ex partner: Patient refused    Emotionally abused: Patient refused    Physically abused: Patient refused    Forced sexual activity: Patient refused  Other Topics Concern  . Not on file  Social History Narrative   Patient lives alone in [place].   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   Patient has 1 living daughter     Family History  Problem Relation Age of Onset  . Heart disease Mother   . Diabetes Mother   . Kidney cancer Mother   . Asthma Father   . Heart disease Father   . Pancreatic cancer Brother   . Diabetes Sister   . Lung cancer Sister   . Diabetes Brother   . Heart attack Son   . Colon cancer Neg Hx     ROS- All systems are reviewed and negative except as per the HPI above  Physical Exam: Vitals:   03/03/18 1107  BP: 116/72  Pulse: 68  Weight: 150 lb (68 kg)  Height: 5\' 2"  (1.575 m)   Wt Readings from Last 3 Encounters:  03/03/18 150 lb (68 kg)  02/26/18 150 lb (68 kg)  02/20/18 150 lb (68 kg)    Labs: Lab Results  Component Value Date   NA 140 02/20/2018   K 4.4 02/20/2018   CL 108 02/20/2018   CO2 25 02/20/2018   GLUCOSE 138 (H) 02/20/2018   BUN 12 02/20/2018   CREATININE 0.67 02/20/2018   CALCIUM 9.6 02/20/2018   PHOS 2.6 01/18/2013   MG 1.8 07/29/2014   Lab Results  Component Value Date   INR 1.17 06/30/2017   No results found for: CHOL, HDL, LDLCALC, TRIG   GEN- The patient is well appearing, alert and oriented x 3 today.   Head- normocephalic, atraumatic Eyes-  Sclera clear, conjunctiva pink Ears- hearing intact Oropharynx- clear Neck- supple, no JVP Lymph- no cervical lymphadenopathy Lungs- Clear to ausculation bilaterally, normal work of breathing Heart- irregular rate and rhythm, no murmurs, rubs or gallops, PMI not laterally displaced GI- soft,  NT, ND, + BS Extremities- no clubbing, cyanosis, or edema MS- no significant deformity or atrophy Skin- no rash or lesion Psych- euthymic mood, full affect Neuro- strength and sensation are  intact  EKG- afib at 68 bpm, qrs int 88 ms, qtc 393 ms    Assessment and Plan: 1. Persistent afib Successful cardioversion but with ERAF within 24 hours We discussed possible options to restore SR, but she wanted to discuss with Dr. [name] Decrease metoprolol to 25 mg 1/2 tab bid, for feeling fatigue and noting some low BP's, I think she could just stop but she says on the drug info not to stop drug suddenly so she will continue until discussed further with Dr. [name]  I will request f/u with him in the next month in [place] office  [place]. Rosamaria Donn, [facility] [address] [place], [place] [address] [phone]

## 2018-03-03 NOTE — Patient Instructions (Signed)
DECREASE Metoprolol to 12.5 mg twice a day; this is 1/2 tablet twice a day  I will request sooner follow up with Dr. [name] in the [place] office.  His scheduler will call you to make this appt.

## 2018-03-10 ENCOUNTER — Telehealth: Payer: Self-pay | Admitting: *Deleted

## 2018-03-10 NOTE — Telephone Encounter (Signed)
Oncology Nurse Navigator Documentation  Oncology Nurse Navigator Flowsheets 03/10/2018  Navigator Location [facility]  Navigator Encounter Type Telephone/I called to check on Nancy Hodges today and see if she was seen by ENT.  Dr. Julien Nordmann made a referral last time she was here and I wanted to make sure she was seen.  I was unable to reach her but did leave vm message to call me with my name and phone number.   Telephone Outgoing Call  Treatment Phase Treatment  Barriers/Navigation Needs Coordination of Care  Interventions Coordination of Care  Coordination of Care Other  Acuity Level 1  Time Spent with Patient 15

## 2018-03-16 ENCOUNTER — Encounter: Payer: Self-pay | Admitting: Internal Medicine

## 2018-03-16 ENCOUNTER — Other Ambulatory Visit: Payer: Medicare Other

## 2018-03-16 ENCOUNTER — Ambulatory Visit: Payer: Medicare Other | Admitting: Internal Medicine

## 2018-03-16 ENCOUNTER — Inpatient Hospital Stay: Payer: Medicare Other | Attending: Internal Medicine

## 2018-03-16 ENCOUNTER — Inpatient Hospital Stay: Payer: Medicare Other

## 2018-03-16 ENCOUNTER — Inpatient Hospital Stay (HOSPITAL_BASED_OUTPATIENT_CLINIC_OR_DEPARTMENT_OTHER): Payer: Medicare Other | Admitting: Internal Medicine

## 2018-03-16 ENCOUNTER — Ambulatory Visit: Payer: Medicare Other

## 2018-03-16 ENCOUNTER — Telehealth: Payer: Self-pay | Admitting: Internal Medicine

## 2018-03-16 ENCOUNTER — Encounter: Payer: Self-pay | Admitting: *Deleted

## 2018-03-16 ENCOUNTER — Other Ambulatory Visit: Payer: Self-pay | Admitting: Internal Medicine

## 2018-03-16 VITALS — BP 126/106 | HR 62 | Temp 97.9°F | Resp 18 | Ht 62.0 in | Wt 151.7 lb

## 2018-03-16 VITALS — BP 150/59

## 2018-03-16 DIAGNOSIS — C3491 Malignant neoplasm of unspecified part of right bronchus or lung: Secondary | ICD-10-CM

## 2018-03-16 DIAGNOSIS — Z79899 Other long term (current) drug therapy: Secondary | ICD-10-CM | POA: Insufficient documentation

## 2018-03-16 DIAGNOSIS — C3411 Malignant neoplasm of upper lobe, right bronchus or lung: Secondary | ICD-10-CM | POA: Insufficient documentation

## 2018-03-16 DIAGNOSIS — I480 Paroxysmal atrial fibrillation: Secondary | ICD-10-CM

## 2018-03-16 DIAGNOSIS — Z95828 Presence of other vascular implants and grafts: Secondary | ICD-10-CM

## 2018-03-16 DIAGNOSIS — Z5112 Encounter for antineoplastic immunotherapy: Secondary | ICD-10-CM

## 2018-03-16 DIAGNOSIS — Z923 Personal history of irradiation: Secondary | ICD-10-CM | POA: Diagnosis not present

## 2018-03-16 DIAGNOSIS — C3490 Malignant neoplasm of unspecified part of unspecified bronchus or lung: Secondary | ICD-10-CM

## 2018-03-16 DIAGNOSIS — Z7901 Long term (current) use of anticoagulants: Secondary | ICD-10-CM

## 2018-03-16 DIAGNOSIS — C773 Secondary and unspecified malignant neoplasm of axilla and upper limb lymph nodes: Secondary | ICD-10-CM

## 2018-03-16 LAB — CMP (CANCER CENTER ONLY)
ALT: 24 U/L (ref 0–44)
AST: 29 U/L (ref 15–41)
Albumin: 3.9 g/dL (ref 3.5–5.0)
Alkaline Phosphatase: 108 U/L (ref 38–126)
Anion gap: 10 (ref 5–15)
BUN: 14 mg/dL (ref 8–23)
CHLORIDE: 105 mmol/L (ref 98–111)
CO2: 25 mmol/L (ref 22–32)
CREATININE: 0.78 mg/dL (ref 0.44–1.00)
Calcium: 9.7 mg/dL (ref 8.9–10.3)
GFR, Est AFR Am: >60 mL/min (ref >60)
Glucose, Bld: 128 mg/dL — ABNORMAL HIGH (ref 70–99)
POTASSIUM: 4.7 mmol/L (ref 3.5–5.1)
SODIUM: 140 mmol/L (ref 135–145)
Total Bilirubin: 1.6 mg/dL — ABNORMAL HIGH (ref 0.3–1.2)
Total Protein: 7.2 g/dL (ref 6.5–8.1)

## 2018-03-16 LAB — CBC WITH DIFFERENTIAL (CANCER CENTER ONLY)
Basophils Absolute: 0.1 10*3/uL (ref 0.0–0.1)
Basophils Relative: 1 %
EOS PCT: 3 %
Eosinophils Absolute: 0.2 10*3/uL (ref 0.0–0.5)
HCT: 37.4 % (ref 34.8–46.6)
HEMOGLOBIN: 12.9 g/dL (ref 11.6–15.9)
LYMPHS ABS: 1 10*3/uL (ref 0.9–3.3)
LYMPHS PCT: 19 %
MCH: 33 pg (ref 25.1–34.0)
MCHC: 34.6 g/dL (ref 31.5–36.0)
MCV: 95.6 fL (ref 79.5–101.0)
MONOS PCT: 10 %
Monocytes Absolute: 0.5 10*3/uL (ref 0.1–0.9)
NEUTROS PCT: 67 %
Neutro Abs: 3.4 10*3/uL (ref 1.5–6.5)
Platelet Count: 182 10*3/uL (ref 145–400)
RBC: 3.91 MIL/uL (ref 3.70–5.45)
RDW: 13.9 % (ref 11.2–14.5)
WBC: 5.2 10*3/uL (ref 3.9–10.3)

## 2018-03-16 LAB — TSH: TSH: 2.592 u[IU]/mL (ref 0.308–3.960)

## 2018-03-16 MED ORDER — SODIUM CHLORIDE 0.9% FLUSH
10.0000 mL | INTRAVENOUS | Status: DC | PRN
  Administered 2018-03-16: 10 mL via INTRAVENOUS
  Filled 2018-03-16: qty 10

## 2018-03-16 MED ORDER — SODIUM CHLORIDE 0.9 % IV SOLN
Freq: Once | INTRAVENOUS | Status: AC
  Administered 2018-03-16: 12:00:00 via INTRAVENOUS
  Filled 2018-03-16: qty 250

## 2018-03-16 MED ORDER — HEPARIN SOD (PORK) LOCK FLUSH 100 UNIT/ML IV SOLN
500.0000 [IU] | Freq: Once | INTRAVENOUS | Status: DC | PRN
  Filled 2018-03-16: qty 5

## 2018-03-16 MED ORDER — SODIUM CHLORIDE 0.9 % IV SOLN
480.0000 mg | Freq: Once | INTRAVENOUS | Status: AC
  Administered 2018-03-16: 480 mg via INTRAVENOUS
  Filled 2018-03-16: qty 48

## 2018-03-16 MED ORDER — HEPARIN SOD (PORK) LOCK FLUSH 100 UNIT/ML IV SOLN
500.0000 [IU] | Freq: Once | INTRAVENOUS | Status: AC | PRN
  Administered 2018-03-16: 500 [IU]
  Filled 2018-03-16: qty 5

## 2018-03-16 MED ORDER — SODIUM CHLORIDE 0.9% FLUSH
10.0000 mL | INTRAVENOUS | Status: DC | PRN
  Administered 2018-03-16: 10 mL
  Filled 2018-03-16: qty 10

## 2018-03-16 NOTE — Patient Instructions (Signed)
[facility] Discharge Instructions for Patients Receiving Chemotherapy  Today you received the following chemotherapy agents :  Opdivo.  To help prevent nausea and vomiting after your treatment, we encourage you to take your nausea medication as prescribed.   If you develop nausea and vomiting that is not controlled by your nausea medication, call the clinic.   BELOW ARE SYMPTOMS THAT SHOULD BE REPORTED IMMEDIATELY:  *FEVER GREATER THAN 100.5 F  *CHILLS WITH OR WITHOUT FEVER  NAUSEA AND VOMITING THAT IS NOT CONTROLLED WITH YOUR NAUSEA MEDICATION  *UNUSUAL SHORTNESS OF BREATH  *UNUSUAL BRUISING OR BLEEDING  TENDERNESS IN MOUTH AND THROAT WITH OR WITHOUT PRESENCE OF ULCERS  *URINARY PROBLEMS  *BOWEL PROBLEMS  UNUSUAL RASH Items with * indicate a potential emergency and should be followed up as soon as possible.  Feel free to call the clinic should you have any questions or concerns. The clinic phone number is [phone].  Please show the CHEMO ALERT CARD at check-in to the Emergency Department and triage nurse.   

## 2018-03-16 NOTE — Telephone Encounter (Signed)
Appts already scheduled per 8/13 los.

## 2018-03-16 NOTE — Progress Notes (Signed)
Per Nancy Hodges it is okay to treat pt today with Nivolumab and elevated bilirubin.

## 2018-03-16 NOTE — Progress Notes (Signed)
[facility] Telephone:([phone]   [phone]  OFFICE PROGRESS NOTE  Harris, [name], [name] [address] [place] [address]  DIAGNOSIS: Metastatic non-small cell lung cancer, adenocarcinoma. This was initially diagnosed as stage IB (T2a, N0, M0) in March of 2014, status post right upper lobectomy with lymph node dissection but the patient has evidence for disease recurrence in the right axilla status post resection.  PRIOR THERAPY:  1) Status post right axillary lymph node biopsy on 05/01/2014 under the care of Dr. Roxan Hockey. 2) Systemic chemotherapy with carboplatin for AUC of 5 and Alimta 500 mg/M2 every 3 weeks. First cycle 05/31/2014. Status post 6 cycles. Last dose was given 09/22/2014. 3) palliative radiotherapy to the subcarinal lymph node under the care of Dr. Sondra Come. 4) Nivolumab 240 mg IV every 2 weeks. First dose 12/23/2016. Status post 6 cycles.  CURRENT THERAPY:  Nivolumab 480 mg IV every 4 weeks. First dose 03/17/2017. Status post 13 cycles.  INTERVAL HISTORY: Nancy Hodges 72 y.o. female returns to the clinic today for follow-up visit.  The patient is feeling fine today with no specific complaints except for intermittent fatigue.  She underwent cardioversion last month.  She denied having any current chest pain, shortness breath, cough or hemoptysis.  She denied having any fever or chills.  She has no nausea, vomiting, diarrhea or constipation.  She continues to tolerate her treatment with Nivolumab fairly well.  She is here for evaluation before starting cycle #14.  MEDICAL HISTORY: Past Medical History:  Diagnosis Date  . Allergic rhinitis   . Anxiety   . Arthritis   . Asthma   . Atrial flutter ([place])   . Cholelithiases 09/26/2015  . Diverticulosis   . Encounter for antineoplastic immunotherapy 12/09/2016  . GERD (gastroesophageal reflux disease)   . H/O hiatal hernia   . History of blood transfusion   . History of  chemotherapy   . History of kidney stones   . History of radiation therapy   . Internal hemorrhoid   . Lung cancer ([place])    a. Stage IB non-small cell carcinoma, s/p R VATS, wedge resection, RU lobectomy 10/2012.  . Paroxysmal atrial fibrillation ([facility])    a. anticoagulated with Xarelto  . Pneumonia 06/2016   [facility]  . Presence of permanent cardiac pacemaker   . Pulmonary nodule   . Tachycardia-bradycardia syndrome ([place])    a. 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    ALLERGIES:  is allergic to chlorpheniramine-dm; prednisone; septra [sulfamethoxazole-trimethoprim]; penicillins; and vioxx [rofecoxib].  MEDICATIONS:  Current Outpatient Medications  Medication Sig Dispense Refill  . acetaminophen (TYLENOL) 500 MG tablet Take 500 mg by mouth every 6 (six) hours as needed for moderate pain or fever.     [name] albuterol (PROVENTIL HFA;VENTOLIN HFA) 108 (90 BASE) MCG/ACT inhaler Inhale 1-2 puffs into the lungs every 6 (six) hours as needed for wheezing or shortness of breath.     . diltiazem (CARDIZEM CD) 240 MG 24 hr capsule Take 1 capsule (240 mg total) by mouth daily. 90 capsule 3  . hydroxypropyl methylcellulose / hypromellose (ISOPTO TEARS / GONIOVISC) 2.5 % ophthalmic solution Place 1-2 drops into the left eye as needed for dry eyes.     [name] ipratropium-albuterol (DUONEB) 0.5-2.5 (3) MG/3ML SOLN Take 3 mLs by nebulization every 6 (six) hours as needed (shortness of breath or wheezing).     [name] lidocaine-prilocaine (EMLA) cream Apply 1 application topically as needed. (Patient taking  differently: Apply 1 application topically as needed (for port). ) 30 g 4  . loratadine (CLARITIN) 10 MG tablet Take 10 mg by mouth daily.    . meclizine (ANTIVERT) 12.5 MG tablet Take 12.5 mg by mouth 3 (three) times daily as needed for dizziness. Takes 1/2 tablet tid as needed.     . metoprolol tartrate (LOPRESSOR) 25 MG tablet Take 0.5 tablets (12.5 mg total) by mouth 2 (two) times daily. 60 tablet 0  .  ondansetron (ZOFRAN ODT) 4 MG disintegrating tablet Take 1 tablet (4 mg total) by mouth every 8 (eight) hours as needed for nausea or vomiting. 6 tablet 0  . rivaroxaban (XARELTO) 20 MG TABS tablet Take 1 tablet (20 mg total) by mouth daily with supper. 30 tablet 11   No current facility-administered medications for this visit.     SURGICAL HISTORY:  Past Surgical History:  Procedure Laterality Date  . CARDIOVERSION N/A 02/19/2018   Procedure: CARDIOVERSION;  Surgeon: Sanda Klein, MD;  Location: [facility];  Service: Cardiovascular;  Laterality: N/A;  . CATARACT EXTRACTION W/PHACO Right 08/14/2016   Procedure: CATARACT EXTRACTION PHACO AND INTRAOCULAR LENS PLACEMENT RIGHT EYE CDE 10.53;  Surgeon: Tonny Branch, MD;  Location: [facility];  Service: Ophthalmology;  Laterality: Right;  right  . CATARACT EXTRACTION W/PHACO Left 09/25/2016   Procedure: CATARACT EXTRACTION PHACO AND INTRAOCULAR LENS PLACEMENT (IOC);  Surgeon: Tonny Branch, MD;  Location: [facility];  Service: Ophthalmology;  Laterality: Left;  left CDE 8.81  . COLONOSCOPY     [facility]: cattered sigmoid diverticula, internal hemorrhoids, sessile polyp in ascending and mid transverse colon (tubular adenoma).   . colonoscopy with polypectomy  2015  . ESOPHAGOGASTRODUODENOSCOPY     [facility]: bile reflux in stomach, mild gastritis, hiatal hernia  . ESOPHAGOGASTRODUODENOSCOPY N/A 01/31/2016   Procedure: ESOPHAGOGASTRODUODENOSCOPY (EGD);  Surgeon: [name], MD;  [facility];  Service: Endoscopy;  Laterality: N/A;  1200  . IR FLUORO GUIDE PORT INSERTION RIGHT  06/30/2017  . IR US GUIDE VASC ACCESS RIGHT  06/30/2017  . LOBECTOMY Right 10/27/2012   Procedure: LOBECTOMY;  Surgeon: [name], MD;  Location: [facility];  Service: Thoracic;  Laterality: Right;   RIGHT UPPER LOBECTOMY & Node Dissection  . LYMPH NODE BIOPSY Right 05/01/2014   Procedure: LYMPH NODE BIOPSY, Right Axillary;  Surgeon: [name], MD;   Location: [facility];  Service: Thoracic;  Laterality: Right;  . PACEMAKER IMPLANT N/A 10/30/2017   SJM Assurity MRI PPM implanted by Dr [name] once leadless pacemaker battery depleted  . PARTIAL HYSTERECTOMY    . PERMANENT PACEMAKER INSERTION N/A 09/14/2012   Nanostim (SJM) leadless pacemaker (LEADLESS II STUDY PATIENT) implanted by Dr [name], no longer functioning, device abandoned.  [name] VIDEO ASSISTED THORACOSCOPY (VATS)/WEDGE RESECTION Right 10/27/2012   Procedure: VIDEO ASSISTED THORACOSCOPY (VATS),RGHT UPPER LOBE LUNG WEDGE RESECTION;  Surgeon: [name], MD;  Location: [facility];  Service: Thoracic;  Laterality: Right;    REVIEW OF SYSTEMS:  A comprehensive review of systems was negative except for: Constitutional: positive for fatigue   PHYSICAL EXAMINATION: General appearance: alert, cooperative, fatigued and no distress Head: Normocephalic, without obvious abnormality, atraumatic Neck: no adenopathy, no JVD, supple, symmetrical, trachea midline and thyroid not enlarged, symmetric, no tenderness/mass/nodules Lymph nodes: Cervical, supraclavicular, and axillary nodes normal. Resp: clear to auscultation bilaterally Back: symmetric, no curvature. ROM normal. No CVA tenderness. Cardio: regular rate and rhythm, S1, S2 normal, no murmur, click, rub or gallop GI: soft, non-tender; bowel  sounds normal; no masses,  no organomegaly Extremities: extremities normal, atraumatic, no cyanosis or edema  ECOG PERFORMANCE STATUS: 1 - Symptomatic but completely ambulatory  Blood pressure (!) 126/106, pulse 62, temperature 97.9 F (36.6 C), temperature source Oral, resp. rate 18, height 5\' 2"  (1.575 m), weight 151 lb 11.2 oz (68.8 kg), SpO2 97 %.  LABORATORY DATA: Lab Results  Component Value Date   WBC 5.2 03/16/2018   HGB 12.9 03/16/2018   HCT 37.4 03/16/2018   MCV 95.6 03/16/2018   PLT 182 03/16/2018      Chemistry      Component Value Date/Time   NA 140 03/16/2018 1004   NA 139  08/05/2017 0902   K 4.7 03/16/2018 1004   K 4.1 08/05/2017 0902   CL 105 03/16/2018 1004   CO2 25 03/16/2018 1004   CO2 28 08/05/2017 0902   BUN 14 03/16/2018 1004   BUN 10.1 08/05/2017 0902   CREATININE 0.78 03/16/2018 1004   CREATININE 0.8 08/05/2017 0902   GLU 134 (H) 02/18/2017 1410      Component Value Date/Time   CALCIUM 9.7 03/16/2018 1004   CALCIUM 9.5 08/05/2017 0902   ALKPHOS 108 03/16/2018 1004   ALKPHOS 111 08/05/2017 0902   AST 29 03/16/2018 1004   AST 33 08/05/2017 0902   ALT 24 03/16/2018 1004   ALT 34 08/05/2017 0902   BILITOT 1.6 (H) 03/16/2018 1004   BILITOT 1.39 (H) 08/05/2017 0902       RADIOGRAPHIC STUDIES: Dg Chest 2 View  Result Date: 02/26/2018 CLINICAL DATA:  Cough EXAM: CHEST - 2 VIEW COMPARISON:  02/20/2018 FINDINGS: Cardiac shadows within normal limits. Pacing device is again identified and stable. Lead less pacemaker is noted as well stable from the prior exam. Right chest wall port is seen. Postsurgical changes in the right hilum are noted. The lungs are well aerated bilaterally. Chronic scarring in the right base is noted. No acute abnormality is seen. IMPRESSION: Chronic changes without acute abnormality. Electronically Signed   By: Inez Catalina M.D.   On: 02/26/2018 16:05   Dg Chest 2 View  Result Date: 02/20/2018 CLINICAL DATA:  Pt is her for afib, pt went into an afib episode yesterday and was cardioverted back into rhythm. Pt came to ED today for another afib episode. Pt also reports feeling weak. EXAM: CHEST - 2 VIEW COMPARISON:  01/08/2018 FINDINGS: Port in the anterior chest wall with tip in distal SVC. LEFT-sided pacer over normal cardiac silhouette. Small RIGHT effusion. LEFT lung base clear. Upper lungs clear. Lungs are hyperinflated IMPRESSION: 1. Hyperinflated lungs without acute findings. 2. Small RIGHT effusion. Electronically Signed   By: Suzy Bouchard M.D.   On: 02/20/2018 14:00   Ct Chest W Contrast  Result Date:  02/15/2018 CLINICAL DATA:  Non-small-cell lung cancer on the right. Initially diagnosed in March 2014 with right upper lobectomy and subsequent right axillary nodal recurrence. EXAM: CT CHEST, ABDOMEN, AND PELVIS WITH CONTRAST TECHNIQUE: Multidetector CT imaging of the chest, abdomen and pelvis was performed following the standard protocol during bolus administration of intravenous contrast. CONTRAST:  166mL ISOVUE-300 IOPAMIDOL (ISOVUE-300) INJECTION 61% COMPARISON:  CT 11/23/2016 and 08/23/2017. FINDINGS: CT CHEST FINDINGS Cardiovascular: There is atherosclerosis of aorta, great vessels and coronary arteries. Right IJ Port-A-Cath extends to the superior cavoatrial junction. The left subclavian pacemaker leads are unchanged in position. No acute vascular findings are seen. The heart size is normal. There is no pericardial effusion. Mediastinum/Nodes: There are no enlarged mediastinal, hilar  or axillary lymph nodes. Right axillary surgical clips. Right supraclavicular node measuring 7 mm on image 11/2 is slightly more prominent than on previous study. There is a stable small hiatal hernia. Lungs/Pleura: There is no pleural effusion. The right lung has a stable appearance status post right upper lobectomy. There is scattered subpleural scarring in the right lung. There is new clustered nodularity in the left upper lingula (images 61-74/6 and 86 through 94/6), likely inflammatory. There is a stable left apical 8 x 5 mm ground-glass nodule image 34/6. No highly suspicious nodules. Musculoskeletal/Chest wall: No chest wall mass or suspicious osseous findings. CT ABDOMEN AND PELVIS FINDINGS Hepatobiliary: Contour irregularity of the liver consistent with cirrhosis again noted. There is a small hypervascular lesion in the dome of the right hepatic lobe, measuring 9 mm on image 48/2. This is questionably present on the prior study, although more conspicuous today. No hypoattenuating liver lesions. There is a small  recanalized left paraumbilical vein. Multiple gallstones are present. No gallbladder wall thickening or biliary dilatation. Pancreas: Unremarkable. No pancreatic ductal dilatation or surrounding inflammatory changes. Spleen: There is a stable 13 mm low-density lesion inferiorly on image 66/2. Stable mild splenomegaly. Adrenals/Urinary Tract: Both adrenal glands appear normal. Stable mild cortical scarring in both kidneys. No evidence of urinary tract calculus, hydronephrosis or renal mass. The bladder appears normal. Stomach/Bowel: No evidence of bowel wall thickening, distention or surrounding inflammatory change. The appendix appears normal. There are stable diverticular changes in the descending and sigmoid colon. Vascular/Lymphatic: There are stable prominent lymph nodes in the upper abdomen, including an 11 mm portacaval node on image 64/2 and a 7 mm aortocaval node on image 72/2. There is aortic and branch vessel atherosclerosis with long segment, non ostial stenosis SMA lumen which appears unchanged. No evidence of large vessel occlusion. Reproductive: Hysterectomy.  Stable appearance of the adnexa. Other: No evidence of abdominal wall mass or hernia. No ascites. Musculoskeletal: No acute or significant osseous findings. IMPRESSION: 1. No findings suspicious for local recurrence or metastatic disease. 2. New clustered nodularity in the left upper lobe and lingula, likely inflammatory. Attention on follow-up recommended. 3. Small right supraclavicular node is minimally larger than on the prior study. No other thoracic adenopathy. Upper abdominal lymph nodes are stable. 4. Morphologic changes of cirrhosis with small hypervascular lesion in the dome of the right lobe. This would be best evaluated by MRI, although that may be precluded by the patient's pacemaker. Attention on follow-up recommended. 5. Cholelithiasis. 6. Aortic Atherosclerosis (ICD10-I70.0). Electronically Signed   By: Richardean Sale M.D.   On:  02/15/2018 13:31   Ct Abdomen Pelvis W Contrast  Result Date: 02/15/2018 CLINICAL DATA:  Non-small-cell lung cancer on the right. Initially diagnosed in March 2014 with right upper lobectomy and subsequent right axillary nodal recurrence. EXAM: CT CHEST, ABDOMEN, AND PELVIS WITH CONTRAST TECHNIQUE: Multidetector CT imaging of the chest, abdomen and pelvis was performed following the standard protocol during bolus administration of intravenous contrast. CONTRAST:  142mL ISOVUE-300 IOPAMIDOL (ISOVUE-300) INJECTION 61% COMPARISON:  CT 11/23/2016 and 08/23/2017. FINDINGS: CT CHEST FINDINGS Cardiovascular: There is atherosclerosis of aorta, great vessels and coronary arteries. Right IJ Port-A-Cath extends to the superior cavoatrial junction. The left subclavian pacemaker leads are unchanged in position. No acute vascular findings are seen. The heart size is normal. There is no pericardial effusion. Mediastinum/Nodes: There are no enlarged mediastinal, hilar or axillary lymph nodes. Right axillary surgical clips. Right supraclavicular node measuring 7 mm on image 11/2 is slightly more  prominent than on previous study. There is a stable small hiatal hernia. Lungs/Pleura: There is no pleural effusion. The right lung has a stable appearance status post right upper lobectomy. There is scattered subpleural scarring in the right lung. There is new clustered nodularity in the left upper lingula (images 61-74/6 and 86 through 94/6), likely inflammatory. There is a stable left apical 8 x 5 mm ground-glass nodule image 34/6. No highly suspicious nodules. Musculoskeletal/Chest wall: No chest wall mass or suspicious osseous findings. CT ABDOMEN AND PELVIS FINDINGS Hepatobiliary: Contour irregularity of the liver consistent with cirrhosis again noted. There is a small hypervascular lesion in the dome of the right hepatic lobe, measuring 9 mm on image 48/2. This is questionably present on the prior study, although more conspicuous  today. No hypoattenuating liver lesions. There is a small recanalized left paraumbilical vein. Multiple gallstones are present. No gallbladder wall thickening or biliary dilatation. Pancreas: Unremarkable. No pancreatic ductal dilatation or surrounding inflammatory changes. Spleen: There is a stable 13 mm low-density lesion inferiorly on image 66/2. Stable mild splenomegaly. Adrenals/Urinary Tract: Both adrenal glands appear normal. Stable mild cortical scarring in both kidneys. No evidence of urinary tract calculus, hydronephrosis or renal mass. The bladder appears normal. Stomach/Bowel: No evidence of bowel wall thickening, distention or surrounding inflammatory change. The appendix appears normal. There are stable diverticular changes in the descending and sigmoid colon. Vascular/Lymphatic: There are stable prominent lymph nodes in the upper abdomen, including an 11 mm portacaval node on image 64/2 and a 7 mm aortocaval node on image 72/2. There is aortic and branch vessel atherosclerosis with long segment, non ostial stenosis SMA lumen which appears unchanged. No evidence of large vessel occlusion. Reproductive: Hysterectomy.  Stable appearance of the adnexa. Other: No evidence of abdominal wall mass or hernia. No ascites. Musculoskeletal: No acute or significant osseous findings. IMPRESSION: 1. No findings suspicious for local recurrence or metastatic disease. 2. New clustered nodularity in the left upper lobe and lingula, likely inflammatory. Attention on follow-up recommended. 3. Small right supraclavicular node is minimally larger than on the prior study. No other thoracic adenopathy. Upper abdominal lymph nodes are stable. 4. Morphologic changes of cirrhosis with small hypervascular lesion in the dome of the right lobe. This would be best evaluated by MRI, although that may be precluded by the patient's pacemaker. Attention on follow-up recommended. 5. Cholelithiasis. 6. Aortic Atherosclerosis  (ICD10-I70.0). Electronically Signed   By: Richardean Sale M.D.   On: 02/15/2018 13:31   ASSESSMENT AND PLAN:  This is a very pleasant 72 years old white female with metastatic non-small cell lung cancer, adenocarcinoma with no actionable mutations status post systemic chemotherapy with carboplatin and Alimta and she is currently undergoing treatment with second line immunotherapy with Nivolumab status post 6 cycles. This was followed by immunotherapy with Nivolumab 480 mg IV every 4 weeks status post 13 cycles. The patient continues to tolerate her treatment well. I recommended for her to proceed with cycle #14 today as scheduled. I will see her back for follow-up visit in 4 weeks for evaluation before the next cycle of her treatment. She was advised to call immediately if she has any concerning symptoms in the interval. The patient voices understanding of current disease status and treatment options and is in agreement with the current care plan. All questions were answered. The patient knows to call the clinic with any problems, questions or concerns. We can certainly see the patient much sooner if necessary.  Disclaimer: This note was  dictated with voice recognition software. Similar sounding words can inadvertently be transcribed and may not be corrected upon review.       

## 2018-03-16 NOTE — Progress Notes (Signed)
Oncology Nurse Navigator Documentation  Oncology Nurse Navigator Flowsheets 03/16/2018  Navigator Location [facility]  Navigator Encounter Type Clinic/MDC/I spoke with patient at clinic today.  She has some complaints of fatigue that she is contributing to heart medication.  She is on Lopressor and I helped to educate on medication and side effects.  I offered her support with her cancer treatment.    Patient Visit Type MedOnc  Treatment Phase Treatment  Barriers/Navigation Needs Education  Education Other  Interventions Education  Education Method Verbal  Acuity Level 2  Time Spent with Patient 46

## 2018-03-22 ENCOUNTER — Encounter: Payer: Self-pay | Admitting: Internal Medicine

## 2018-03-22 ENCOUNTER — Ambulatory Visit (INDEPENDENT_AMBULATORY_CARE_PROVIDER_SITE_OTHER): Payer: Medicare Other | Admitting: Internal Medicine

## 2018-03-22 VITALS — BP 126/60 | HR 60 | Ht 62.0 in | Wt 152.0 lb

## 2018-03-22 DIAGNOSIS — I481 Persistent atrial fibrillation: Secondary | ICD-10-CM

## 2018-03-22 DIAGNOSIS — Z950 Presence of cardiac pacemaker: Secondary | ICD-10-CM

## 2018-03-22 DIAGNOSIS — I495 Sick sinus syndrome: Secondary | ICD-10-CM

## 2018-03-22 DIAGNOSIS — I4819 Other persistent atrial fibrillation: Secondary | ICD-10-CM

## 2018-03-22 MED ORDER — FLECAINIDE ACETATE 50 MG PO TABS: 50.0000 mg | ORAL_TABLET | Freq: Two times a day (BID) | ORAL | 3 refills | Status: DC

## 2018-03-22 NOTE — Progress Notes (Signed)
PCP: [name], [name]   Primary EP:  Dr Lillette Boxer Nancy Hodges is a 72 y.o. female who presents today for routine electrophysiology followup.  Since last being seen in our clinic, the patient reports doing reasonably well.  She underwent cardioversion 02/19/18 but quickly returned to afib.  She has fatigue.  She has not well tolerated metoprolol due to worsening of fatigue and hypotension.  Today, she denies symptoms of palpitations, chest pain, shortness of breath,  lower extremity edema, dizziness, presyncope, or syncope.  The patient is otherwise without complaint today.   Past Medical History:  Diagnosis Date  . Allergic rhinitis   . Anxiety   . Arthritis   . Asthma   . Atrial flutter ([place])   . Cholelithiases 09/26/2015  . Diverticulosis   . Encounter for antineoplastic immunotherapy 12/09/2016  . GERD (gastroesophageal reflux disease)   . H/O hiatal hernia   . History of blood transfusion   . History of chemotherapy   . History of kidney stones   . History of radiation therapy   . Internal hemorrhoid   . Lung cancer ([place])    a. Stage IB non-small cell carcinoma, s/p R VATS, wedge resection, RU lobectomy 10/2012.  . Paroxysmal atrial fibrillation ([facility])    a. anticoagulated with Xarelto  . Pneumonia 06/2016   [facility]  . Presence of permanent cardiac pacemaker   . Pulmonary nodule   . Tachycardia-bradycardia syndrome ([place])    a. 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   Past Surgical History:  Procedure Laterality Date  . CARDIOVERSION N/A 02/19/2018   Procedure: CARDIOVERSION;  Surgeon: Sanda Klein, MD;  Location: [facility];  Service: Cardiovascular;  Laterality: N/A;  . CATARACT EXTRACTION W/PHACO Right 08/14/2016   Procedure: CATARACT EXTRACTION PHACO AND INTRAOCULAR LENS PLACEMENT RIGHT EYE CDE 10.53;  Surgeon: Tonny Branch, MD;  Location: [facility];  Service: Ophthalmology;  Laterality: Right;  right  . CATARACT EXTRACTION W/PHACO Left 09/25/2016   Procedure: CATARACT EXTRACTION PHACO AND INTRAOCULAR LENS PLACEMENT (IOC);  Surgeon: Tonny Branch, MD;  Location: [facility];  Service: Ophthalmology;  Laterality: Left;  left CDE 8.81  . COLONOSCOPY     [facility]: cattered sigmoid diverticula, internal hemorrhoids, sessile polyp in ascending and mid transverse colon (tubular adenoma).   . colonoscopy with polypectomy  2015  . ESOPHAGOGASTRODUODENOSCOPY     [facility]: bile reflux in stomach, mild gastritis, hiatal hernia  . ESOPHAGOGASTRODUODENOSCOPY N/A 01/31/2016   Procedure: ESOPHAGOGASTRODUODENOSCOPY (EGD);  Surgeon: [name], MD;  [facility];  Service: Endoscopy;  Laterality: N/A;  1200  . IR FLUORO GUIDE PORT INSERTION RIGHT  06/30/2017  . IR US GUIDE VASC ACCESS RIGHT  06/30/2017  . LOBECTOMY Right 10/27/2012   Procedure: LOBECTOMY;  Surgeon: [name], MD;  Location: [facility];  Service: Thoracic;  Laterality: Right;   RIGHT UPPER LOBECTOMY & Node Dissection  . LYMPH NODE BIOPSY Right 05/01/2014   Procedure: LYMPH NODE BIOPSY, Right Axillary;  Surgeon: [name], MD;  Location: [facility];  Service: Thoracic;  Laterality: Right;  . PACEMAKER IMPLANT N/A 10/30/2017   SJM Assurity MRI PPM implanted by Dr [name] once leadless pacemaker battery depleted  . PARTIAL HYSTERECTOMY    . PERMANENT PACEMAKER INSERTION N/A 09/14/2012   Nanostim (SJM) leadless pacemaker (LEADLESS II STUDY PATIENT) implanted by Dr [name], no longer functioning, device abandoned.  [name] VIDEO ASSISTED THORACOSCOPY (VATS)/WEDGE RESECTION Right 10/27/2012   Procedure: VIDEO ASSISTED THORACOSCOPY (VATS),RGHT UPPER LOBE  LUNG WEDGE RESECTION;  Surgeon: [name], MD;  Location: [facility];  Service: Thoracic;  Laterality: Right;    ROS- all systems are reviewed and negative except as per HPI above  Current Outpatient Medications  Medication Sig Dispense Refill  . acetaminophen (TYLENOL) 500 MG tablet Take 500 mg by mouth every 6 (six) hours as  needed for moderate pain or fever.     [name] albuterol (PROVENTIL HFA;VENTOLIN HFA) 108 (90 BASE) MCG/ACT inhaler Inhale 1-2 puffs into the lungs every 6 (six) hours as needed for wheezing or shortness of breath.     . diltiazem (CARDIZEM CD) 240 MG 24 hr capsule Take 1 capsule (240 mg total) by mouth daily. 90 capsule 3  . hydroxypropyl methylcellulose / hypromellose (ISOPTO TEARS / GONIOVISC) 2.5 % ophthalmic solution Place 1-2 drops into the left eye as needed for dry eyes.     [name] ipratropium-albuterol (DUONEB) 0.5-2.5 (3) MG/3ML SOLN Take 3 mLs by nebulization every 6 (six) hours as needed (shortness of breath or wheezing).     [name] lidocaine-prilocaine (EMLA) cream Apply 1 application topically as needed. (Patient taking differently: Apply 1 application topically as needed (for port). ) 30 g 4  . loratadine (CLARITIN) 10 MG tablet Take 10 mg by mouth daily.    . meclizine (ANTIVERT) 12.5 MG tablet Take 12.5 mg by mouth 3 (three) times daily as needed for dizziness. Takes 1/2 tablet tid as needed.     . metoprolol tartrate (LOPRESSOR) 25 MG tablet Take 0.5 tablets (12.5 mg total) by mouth 2 (two) times daily. 60 tablet 0  . ondansetron (ZOFRAN ODT) 4 MG disintegrating tablet Take 1 tablet (4 mg total) by mouth every 8 (eight) hours as needed for nausea or vomiting. 6 tablet 0  . rivaroxaban (XARELTO) 20 MG TABS tablet Take 1 tablet (20 mg total) by mouth daily with supper. 30 tablet 11   No current facility-administered medications for this visit.     Physical Exam: Vitals:   03/22/18 1317  BP: 126/60  Pulse: 60  SpO2: 97%  Weight: 152 lb (68.9 kg)  Height: 5\' 2"  (1.575 m)    GEN- The patient is well appearing, alert and oriented x 3 today.   Head- normocephalic, atraumatic Eyes-  Sclera clear, conjunctiva pink Ears- hearing intact Oropharynx- clear Lungs- Clear to ausculation bilaterally, normal work of breathing Chest- pacemaker pocket is well healed Heart- irregular rate and rhythm, no  murmurs, rubs or gallops, PMI not laterally displaced GI- soft, NT, ND, + BS Extremities- no clubbing, cyanosis, or edema  Pacemaker interrogation- reviewed in detail today,  See PACEART report  ekg tracing ordered today is personally reviewed and shows afib with demand V pacing  Assessment and Plan:  1. Symptomatic sinus bradycardia  Normal pacemaker function See Pace Art report No changes today  2. Persistent afib On xarelto (chads2vasc score is 3) We discussed options of flecainide, tikosyn, and amiodarone at length today.  At this time, she would like to try flecainide. Stop metoprolol due to intolerance. Start flecainide 50mg  BID Return in 1 week for ekg.  If still in AF, will need to increase flecainide to 100mg  bid.   Follow-up in AF clinic in 2-3 weeks.  If still in AF, would need repeat cardioversion at that time.  Merlin   [name] MD, [facility] 03/22/2018 1:40 PM

## 2018-03-22 NOTE — Patient Instructions (Addendum)
Medication Instructions:  Your physician has recommended you make the following change in your medication:   1.  Stop taking metoprolol  2.  Start taking flecainide 50 mg one tablet by mouth twice a day   Labwork: None ordered.  Testing/Procedures:  You will get an EKG at the [place] office on March 29, 2018 at 4:00 pm  Follow-Up: Your physician wants you to follow-up in: 2 weeks with Roderic Palau NP at the [place] clinic.  April 13, 2018 at 11:00 am  Remote monitoring is used to monitor your Pacemaker from home. This monitoring reduces the number of office visits required to check your device to one time per year. It allows [place] to keep an eye on the functioning of your device to ensure it is working properly. You are scheduled for a device check from home on 05/03/2018. You may send your transmission at any time that day. If you have a wireless device, the transmission will be sent automatically. After your physician reviews your transmission, you will receive a postcard with your next transmission date.  Any Other Special Instructions Will Be Listed Below (If Applicable).  If you need a refill on your cardiac medications before your next appointment, please call your pharmacy.

## 2018-03-23 LAB — CUP PACEART INCLINIC DEVICE CHECK
Battery Voltage: 3.02 V
Implantable Lead Implant Date: 20190329
Implantable Lead Location: 753859
Implantable Lead Location: 753860
Implantable Pulse Generator Implant Date: 20190329
Lead Channel Impedance Value: 425 Ohm
Lead Channel Pacing Threshold Amplitude: 0.75 V
Lead Channel Pacing Threshold Amplitude: 0.75 V
Lead Channel Pacing Threshold Pulse Width: 0.5 ms
Lead Channel Pacing Threshold Pulse Width: 0.5 ms
Lead Channel Setting Pacing Amplitude: 2 V
Lead Channel Setting Pacing Pulse Width: 0.5 ms
Lead Channel Setting Sensing Sensitivity: 2 mV
MDC IDC LEAD IMPLANT DT: 20190329
MDC IDC MSMT BATTERY REMAINING LONGEVITY: 130 mo
MDC IDC MSMT LEADCHNL RA SENSING INTR AMPL: 3.2 mV
MDC IDC MSMT LEADCHNL RV IMPEDANCE VALUE: 612.5 Ohm
MDC IDC MSMT LEADCHNL RV SENSING INTR AMPL: 12 mV
MDC IDC SESS DTM: 20190819172329
MDC IDC SET LEADCHNL RV PACING AMPLITUDE: 2.5 V
MDC IDC STAT BRADY RA PERCENT PACED: 0.01 %
MDC IDC STAT BRADY RV PERCENT PACED: 40 %
Pulse Gen Serial Number: 9003317

## 2018-03-25 ENCOUNTER — Telehealth: Payer: Self-pay

## 2018-03-25 NOTE — Telephone Encounter (Signed)
Spoke with Pt.  Pt aware of appt time change.  Per Pt she will drive herself to her [place] appt for EKG.  Pt asks this nurse to contact her ride coordinator.  Left CVM for Soyla Murphy advising of change of appt time for afib clinic from 04/13/2018 to 04/07/2018 at 9:30 am.  This nurse name and # left if any questions/concerns

## 2018-03-29 ENCOUNTER — Ambulatory Visit (INDEPENDENT_AMBULATORY_CARE_PROVIDER_SITE_OTHER): Payer: Medicare Other

## 2018-03-29 ENCOUNTER — Ambulatory Visit (INDEPENDENT_AMBULATORY_CARE_PROVIDER_SITE_OTHER): Payer: Medicare Other | Admitting: Otolaryngology

## 2018-03-29 VITALS — BP 144/71 | HR 67 | Ht 62.0 in | Wt 152.0 lb

## 2018-03-29 DIAGNOSIS — I495 Sick sinus syndrome: Secondary | ICD-10-CM | POA: Diagnosis not present

## 2018-03-29 NOTE — Progress Notes (Signed)
Pt of Dr [name], ere for EKG, obtained and patient will f/u as planned

## 2018-03-29 NOTE — Patient Instructions (Signed)
Follow up with Dr [name] and Roderic Palau as plannned     Thank you for choosing [facility] !

## 2018-04-07 ENCOUNTER — Other Ambulatory Visit: Payer: Self-pay | Admitting: Internal Medicine

## 2018-04-07 ENCOUNTER — Encounter (HOSPITAL_COMMUNITY): Payer: Self-pay | Admitting: Nurse Practitioner

## 2018-04-07 ENCOUNTER — Ambulatory Visit (HOSPITAL_COMMUNITY)
Admission: RE | Admit: 2018-04-07 | Discharge: 2018-04-07 | Disposition: A | Discharge status: Home / Self Care | Payer: Medicare Other | Source: Ambulatory Visit | Attending: Nurse Practitioner | Admitting: Nurse Practitioner

## 2018-04-07 VITALS — BP 118/56 | HR 65 | Ht 62.0 in | Wt 150.0 lb

## 2018-04-07 DIAGNOSIS — I4819 Other persistent atrial fibrillation: Secondary | ICD-10-CM

## 2018-04-07 DIAGNOSIS — Z79899 Other long term (current) drug therapy: Secondary | ICD-10-CM | POA: Insufficient documentation

## 2018-04-07 DIAGNOSIS — K219 Gastro-esophageal reflux disease without esophagitis: Secondary | ICD-10-CM | POA: Insufficient documentation

## 2018-04-07 DIAGNOSIS — J45909 Unspecified asthma, uncomplicated: Secondary | ICD-10-CM | POA: Diagnosis not present

## 2018-04-07 DIAGNOSIS — M1990 Unspecified osteoarthritis, unspecified site: Secondary | ICD-10-CM | POA: Insufficient documentation

## 2018-04-07 DIAGNOSIS — Z87442 Personal history of urinary calculi: Secondary | ICD-10-CM | POA: Diagnosis not present

## 2018-04-07 DIAGNOSIS — I4510 Unspecified right bundle-branch block: Secondary | ICD-10-CM | POA: Diagnosis not present

## 2018-04-07 DIAGNOSIS — Z87891 Personal history of nicotine dependence: Secondary | ICD-10-CM | POA: Diagnosis not present

## 2018-04-07 DIAGNOSIS — I481 Persistent atrial fibrillation: Secondary | ICD-10-CM | POA: Insufficient documentation

## 2018-04-07 DIAGNOSIS — Z950 Presence of cardiac pacemaker: Secondary | ICD-10-CM | POA: Diagnosis not present

## 2018-04-07 DIAGNOSIS — Z7901 Long term (current) use of anticoagulants: Secondary | ICD-10-CM | POA: Insufficient documentation

## 2018-04-07 DIAGNOSIS — F419 Anxiety disorder, unspecified: Secondary | ICD-10-CM | POA: Diagnosis not present

## 2018-04-07 DIAGNOSIS — I4892 Unspecified atrial flutter: Secondary | ICD-10-CM | POA: Diagnosis not present

## 2018-04-07 DIAGNOSIS — Z880 Allergy status to penicillin: Secondary | ICD-10-CM | POA: Insufficient documentation

## 2018-04-07 DIAGNOSIS — Z85118 Personal history of other malignant neoplasm of bronchus and lung: Secondary | ICD-10-CM | POA: Diagnosis not present

## 2018-04-07 DIAGNOSIS — Z882 Allergy status to sulfonamides status: Secondary | ICD-10-CM | POA: Diagnosis not present

## 2018-04-07 DIAGNOSIS — Z888 Allergy status to other drugs, medicaments and biological substances status: Secondary | ICD-10-CM | POA: Diagnosis not present

## 2018-04-07 MED ORDER — FLECAINIDE ACETATE 50 MG PO TABS: 100.0000 mg | ORAL_TABLET | Freq: Two times a day (BID) | ORAL | 3 refills | Status: DC

## 2018-04-07 NOTE — Patient Instructions (Signed)
Increase flecainide to 100mg twice a day 

## 2018-04-07 NOTE — Progress Notes (Signed)
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 female with a h/o PPM, afib, lung CA, that recently had a cardioversion as scheduled by Dr. [name]. It was successful but unfortunately, it lasted only 24 hours. She went to the hospital when she realized she was back in afib. Her v rates were in the 80's. She was given metoprolol 25 mg bid . She reported   that she did not feel as well on metoprolol and had noted some low BP readings, weakness and fatigue. She did not stay out long enough to appreciate if she felt better or not in SR. She has been in persistent afib for several months. She is still undergoing treatment for lung cancer.  She is here for f/u of appointment today, 9/4, with Dr. [name] for ERAF after successful cardioversion. She was started on  flecainide 50 mg bid with plans to increase to 100 mg bid and then cardioversion if she does not convert with the flecainide. Metoprolol was stopped as she did not tolerate.  Today, she denies symptoms of palpitations, chest pain, shortness of breath, orthopnea, PND, lower extremity edema, dizziness, presyncope, syncope, or neurologic sequela. + for fatigue. The patient is tolerating medications without difficulties and is otherwise without complaint today.   Past Medical History:  Diagnosis Date  . Allergic rhinitis   . Anxiety   . Arthritis   . Asthma   . Atrial flutter ([place])   . Cholelithiases 09/26/2015  . Diverticulosis   . Encounter for antineoplastic immunotherapy 12/09/2016  . GERD (gastroesophageal reflux disease)   . H/O hiatal hernia   . History of blood transfusion   . History of chemotherapy   . History of kidney stones   . History of radiation therapy   . Internal hemorrhoid   . Lung cancer ([place])    a. Stage IB non-small cell carcinoma, s/p R VATS, wedge resection, RU lobectomy 10/2012.  . Paroxysmal atrial fibrillation (H[facility])    a. anticoagulated with  Xarelto  . Pneumonia 06/2016   [facility]  . Presence of permanent cardiac pacemaker   . Pulmonary nodule   . Tachycardia-bradycardia syndrome ([place])    a. 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   Past Surgical History:  Procedure Laterality Date  . CARDIOVERSION N/A 02/19/2018   Procedure: CARDIOVERSION;  Surgeon: Sanda Klein, MD;  Location: [facility];  Service: Cardiovascular;  Laterality: N/A;  . CATARACT EXTRACTION W/PHACO Right 08/14/2016   Procedure: CATARACT EXTRACTION PHACO AND INTRAOCULAR LENS PLACEMENT RIGHT EYE CDE 10.53;  Surgeon: Tonny Branch, MD;  [facility];  Service: Ophthalmology;  Laterality: Right;  right  . CATARACT EXTRACTION W/PHACO Left 09/25/2016   Procedure: CATARACT EXTRACTION PHACO AND INTRAOCULAR LENS PLACEMENT (IOC);  Surgeon: Tonny Branch, MD;  [facility];  Service: Ophthalmology;  Laterality: Left;  left CDE 8.81  . COLONOSCOPY     [facility]: cattered sigmoid diverticula, internal hemorrhoids, sessile polyp in ascending and mid transverse colon (tubular adenoma).   . colonoscopy with polypectomy  2015  . ESOPHAGOGASTRODUODENOSCOPY     [facility]: bile reflux in stomach, mild gastritis, hiatal hernia  . ESOPHAGOGASTRODUODENOSCOPY N/A 01/31/2016   Procedure: ESOPHAGOGASTRODUODENOSCOPY (EGD);  Surgeon: [name], MD;  [facility];  Service: Endoscopy;  Laterality: N/A;  1200  . IR FLUORO GUIDE PORT INSERTION RIGHT  06/30/2017  . IR US GUIDE VASC ACCESS RIGHT  06/30/2017  . LOBECTOMY Right 10/27/2012   Procedure:  LOBECTOMY;  Surgeon: [name], MD;  Location: [facility];  Service: Thoracic;  Laterality: Right;   RIGHT UPPER LOBECTOMY & Node Dissection  . LYMPH NODE BIOPSY Right 05/01/2014   Procedure: LYMPH NODE BIOPSY, Right Axillary;  Surgeon: [name], MD;  Location: [facility];  Service: Thoracic;  Laterality: Right;  . PACEMAKER IMPLANT N/A 10/30/2017   SJM Assurity MRI PPM implanted by Dr [name] once leadless  pacemaker battery depleted  . PARTIAL HYSTERECTOMY    . PERMANENT PACEMAKER INSERTION N/A 09/14/2012   Nanostim (SJM) leadless pacemaker (LEADLESS II STUDY PATIENT) implanted by Dr [name], no longer functioning, device abandoned.  [name] VIDEO ASSISTED THORACOSCOPY (VATS)/WEDGE RESECTION Right 10/27/2012   Procedure: VIDEO ASSISTED THORACOSCOPY (VATS),RGHT UPPER LOBE LUNG WEDGE RESECTION;  Surgeon: [name], MD;  Location: [facility];  Service: Thoracic;  Laterality: Right;    Current Outpatient Medications  Medication Sig Dispense Refill  . acetaminophen (TYLENOL) 500 MG tablet Take 500 mg by mouth every 6 (six) hours as needed for moderate pain or fever.     [name] albuterol (PROVENTIL HFA;VENTOLIN HFA) 108 (90 BASE) MCG/ACT inhaler Inhale 1-2 puffs into the lungs every 6 (six) hours as needed for wheezing or shortness of breath.     . diltiazem (CARDIZEM CD) 240 MG 24 hr capsule Take 1 capsule (240 mg total) by mouth daily. 90 capsule 3  . flecainide (TAMBOCOR) 50 MG tablet Take 2 tablets (100 mg total) by mouth 2 (two) times daily. 180 tablet 3  . hydroxypropyl methylcellulose / hypromellose (ISOPTO TEARS / GONIOVISC) 2.5 % ophthalmic solution Place 1-2 drops into the left eye as needed for dry eyes.     [name] ipratropium-albuterol (DUONEB) 0.5-2.5 (3) MG/3ML SOLN Take 3 mLs by nebulization every 6 (six) hours as needed (shortness of breath or wheezing).     [name] lidocaine-prilocaine (EMLA) cream Apply 1 application topically as needed. (Patient taking differently: Apply 1 application topically as needed (for port). ) 30 g 4  . loratadine (CLARITIN) 10 MG tablet Take 10 mg by mouth daily.    . meclizine (ANTIVERT) 12.5 MG tablet Take 12.5 mg by mouth 3 (three) times daily as needed for dizziness. Takes 1/2 tablet tid as needed.     . ondansetron (ZOFRAN ODT) 4 MG disintegrating tablet Take 1 tablet (4 mg total) by mouth every 8 (eight) hours as needed for nausea or vomiting. 6 tablet 0  . rivaroxaban  (XARELTO) 20 MG TABS tablet Take 1 tablet (20 mg total) by mouth daily with supper. 30 tablet 11   No current facility-administered medications for this encounter.     Allergies  Allergen Reactions  . Chlorpheniramine-Dm Anaphylaxis and Other (See Comments)    Pt states BP dropped; developed irregular heartbeat   . Prednisone Shortness Of Breath, Palpitations and Other (See Comments)    Ddouble vision. History is not consistent with any allergy.  Sarina Ill [Sulfamethoxazole-Trimethoprim] Shortness Of Breath  . Penicillins Swelling, Rash and Other (See Comments)    Has patient had a PCN reaction causing immediate rash, facial/tongue/throat swelling, SOB or lightheadedness with hypotension: Yes Has patient had a PCN reaction causing severe rash involving mucus membranes or skin necrosis: No Has patient had a PCN reaction that required hospitalization No Has patient had a PCN reaction occurring within the last 10 years: No If all of the above answers are "NO", then may proceed with Cephalosporin use.   . Vioxx [Rofecoxib] Other (See Comments)    Stomach cramps  Social History   Socioeconomic History  . Marital status: Single    Spouse name: Not on file  . Number of children: Not on file  . Years of education: Not on file  . Highest education level: Not on file  Occupational History  . Not on file  Social Needs  . Financial resource strain: Not on file  . Food insecurity:    Worry: Not on file    Inability: Not on file  . Transportation needs:    Medical: Not on file    Non-medical: Not on file  Tobacco Use  . Smoking status: Former Smoker    Packs/day: 2.00    Years: 40.00    Pack years: 80.00    Types: Cigarettes    Last attempt to quit: 08/05/1995    Years since quitting: 22.6  . Smokeless tobacco: Never Used  Substance and Sexual Activity  . Alcohol use: No  . Drug use: No  . Sexual activity: Not Currently  Lifestyle  . Physical activity:    Days per week:  Patient refused    Minutes per session: Patient refused  . Stress: Patient refused  Relationships  . Social connections:    Talks on phone: Patient refused    Gets together: Patient refused    Attends religious service: Patient refused    Active member of club or organization: Patient refused    Attends meetings of clubs or organizations: Patient refused    Relationship status: Patient refused  . Intimate partner violence:    Fear of current or ex partner: Patient refused    Emotionally abused: Patient refused    Physically abused: Patient refused    Forced sexual activity: Patient refused  Other Topics Concern  . Not on file  Social History Narrative   Patient lives alone in [place].   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   Patient has 1 living daughter     Family History  Problem Relation Age of Onset  . Heart disease Mother   . Diabetes Mother   . Kidney cancer Mother   . Asthma Father   . Heart disease Father   . Pancreatic cancer Brother   . Diabetes Sister   . Lung cancer Sister   . Diabetes Brother   . Heart attack Son   . Colon cancer Neg Hx     ROS- All systems are reviewed and negative except as per the HPI above  Physical Exam: Vitals:   04/07/18 0930  BP: (!) 118/56  Pulse: 65  SpO2: 97%  Weight: 68 kg  Height: 5\' 2"  (1.575 m)   Wt Readings from Last 3 Encounters:  04/07/18 68 kg  03/29/18 68.9 kg  03/22/18 68.9 kg    Labs: Lab Results  Component Value Date   NA 140 03/16/2018   K 4.7 03/16/2018   CL 105 03/16/2018   CO2 25 03/16/2018   GLUCOSE 128 (H) 03/16/2018   BUN 14 03/16/2018   CREATININE 0.78 03/16/2018   CALCIUM 9.7 03/16/2018   PHOS 2.6 01/18/2013   MG 1.8 07/29/2014   Lab Results  Component Value Date   INR 1.17 06/30/2017   No results found for: CHOL, HDL, LDLCALC, TRIG   GEN- The patient is well appearing, alert and oriented x 3 today.   Head- normocephalic, atraumatic Eyes-  Sclera clear, conjunctiva  pink Ears- hearing intact Oropharynx- clear Neck- supple, no JVP Lymph- no cervical lymphadenopathy Lungs- Clear to ausculation bilaterally, normal work of  breathing Heart- irregular rate and rhythm, no murmurs, rubs or gallops, PMI not laterally displaced GI- soft, NT, ND, + BS Extremities- no clubbing, cyanosis, or edema MS- no significant deformity or atrophy Skin- no rash or lesion Psych- euthymic mood, full affect Neuro- strength and sensation are intact  EKG- afib at 65 bpm, qrs int 88 ms, qtc 393 ms    Assessment and Plan: 1. Persistent afib Successful cardioversion but with ERAF within 24 hours  Dr. [name] saw pt and started flecainide 50 mg bid with plans to increase to 100 mg bid today if not back in rhythm and then set up cardioversion in a few weeks.  Continue cardizem   2. Chadsvasc score of 2 Continue eliquis 5 mg bid, reminded not to miss doses  F/u in one week  Butch Penny C. Jevaughn Degollado, [facility] [address] [place], [place] [address] [phone]

## 2018-04-09 ENCOUNTER — Other Ambulatory Visit: Payer: Self-pay | Admitting: Internal Medicine

## 2018-04-12 ENCOUNTER — Encounter (HOSPITAL_COMMUNITY): Payer: Self-pay | Admitting: Nurse Practitioner

## 2018-04-12 ENCOUNTER — Ambulatory Visit (HOSPITAL_COMMUNITY)
Admission: RE | Admit: 2018-04-12 | Discharge: 2018-04-12 | Disposition: A | Discharge status: Home / Self Care | Payer: Medicare Other | Source: Ambulatory Visit | Attending: Nurse Practitioner | Admitting: Nurse Practitioner

## 2018-04-12 ENCOUNTER — Other Ambulatory Visit (HOSPITAL_COMMUNITY): Payer: Self-pay | Admitting: *Deleted

## 2018-04-12 VITALS — BP 136/74 | HR 79 | Ht 62.0 in | Wt 151.0 lb

## 2018-04-12 DIAGNOSIS — I4510 Unspecified right bundle-branch block: Secondary | ICD-10-CM | POA: Diagnosis not present

## 2018-04-12 DIAGNOSIS — Z833 Family history of diabetes mellitus: Secondary | ICD-10-CM | POA: Diagnosis not present

## 2018-04-12 DIAGNOSIS — Z79899 Other long term (current) drug therapy: Secondary | ICD-10-CM | POA: Insufficient documentation

## 2018-04-12 DIAGNOSIS — Z8051 Family history of malignant neoplasm of kidney: Secondary | ICD-10-CM | POA: Insufficient documentation

## 2018-04-12 DIAGNOSIS — I4819 Other persistent atrial fibrillation: Secondary | ICD-10-CM

## 2018-04-12 DIAGNOSIS — Z7901 Long term (current) use of anticoagulants: Secondary | ICD-10-CM | POA: Insufficient documentation

## 2018-04-12 DIAGNOSIS — Z888 Allergy status to other drugs, medicaments and biological substances status: Secondary | ICD-10-CM | POA: Insufficient documentation

## 2018-04-12 DIAGNOSIS — Z882 Allergy status to sulfonamides status: Secondary | ICD-10-CM | POA: Diagnosis not present

## 2018-04-12 DIAGNOSIS — Z87891 Personal history of nicotine dependence: Secondary | ICD-10-CM | POA: Diagnosis not present

## 2018-04-12 DIAGNOSIS — I480 Paroxysmal atrial fibrillation: Secondary | ICD-10-CM | POA: Diagnosis not present

## 2018-04-12 DIAGNOSIS — Z880 Allergy status to penicillin: Secondary | ICD-10-CM | POA: Insufficient documentation

## 2018-04-12 DIAGNOSIS — R9431 Abnormal electrocardiogram [ECG] [EKG]: Secondary | ICD-10-CM | POA: Insufficient documentation

## 2018-04-12 DIAGNOSIS — Z9889 Other specified postprocedural states: Secondary | ICD-10-CM | POA: Insufficient documentation

## 2018-04-12 DIAGNOSIS — Z801 Family history of malignant neoplasm of trachea, bronchus and lung: Secondary | ICD-10-CM | POA: Insufficient documentation

## 2018-04-12 DIAGNOSIS — Z90710 Acquired absence of both cervix and uterus: Secondary | ICD-10-CM | POA: Insufficient documentation

## 2018-04-12 DIAGNOSIS — I481 Persistent atrial fibrillation: Secondary | ICD-10-CM | POA: Insufficient documentation

## 2018-04-12 DIAGNOSIS — J45909 Unspecified asthma, uncomplicated: Secondary | ICD-10-CM | POA: Insufficient documentation

## 2018-04-12 DIAGNOSIS — I4892 Unspecified atrial flutter: Secondary | ICD-10-CM | POA: Diagnosis not present

## 2018-04-12 DIAGNOSIS — Z800 Family history of malignant neoplasm of digestive organs: Secondary | ICD-10-CM | POA: Insufficient documentation

## 2018-04-12 DIAGNOSIS — Z825 Family history of asthma and other chronic lower respiratory diseases: Secondary | ICD-10-CM | POA: Diagnosis not present

## 2018-04-12 DIAGNOSIS — M1990 Unspecified osteoarthritis, unspecified site: Secondary | ICD-10-CM | POA: Insufficient documentation

## 2018-04-12 DIAGNOSIS — Z950 Presence of cardiac pacemaker: Secondary | ICD-10-CM | POA: Diagnosis not present

## 2018-04-12 MED ORDER — FLECAINIDE ACETATE 100 MG PO TABS: 100.0000 mg | ORAL_TABLET | Freq: Two times a day (BID) | ORAL | 2 refills | Status: DC

## 2018-04-12 NOTE — Patient Instructions (Signed)
Cardioversion scheduled for Friday, September 27th  - Arrive at the [facility] and go to admitting at 9:15AM  -Do not eat or drink anything after midnight the night prior to your procedure.  - Take all your morning medication with a sip of water prior to arrival.  - You will not be able to drive home after your procedure.

## 2018-04-12 NOTE — Progress Notes (Signed)
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 female with a h/o PPM, afib, lung CA, that recently had a cardioversion as scheduled by Dr. [name]. It was successful but unfortunately, it lasted only 24 hours. She went to the hospital when she realized she was back in afib. Her v rates were in the 80's. She was given metoprolol 25 mg bid . She reported  that she did not feel as well on metoprolol and had noted some low BP readings, weakness and fatigue. She did not stay out long enough to appreciate if she felt better or not in SR. She has been in persistent afib for several months. She is still undergoing treatment for lung cancer.  She had  f/u of appointment, 9/4, with Dr. [name] for ERAF after successful cardioversion. She was started on  flecainide 50 mg bid with plans to increase to 100 mg bid and then cardioversion if she did not convert with the flecainide. Metoprolol was stopped as she did not tolerate.  F/u in afib clinic, 9/9. On last visit, flecainide was increased to 100 mg bid. She is tolerating ok but unfortunately it has not converted pt. She will be set up for cardioversion but pt needs to delay a couple of weeks as she has limited access to people that can be with her to drive her home form the hospital. No missed doses of xarelto.  Today, she denies symptoms of palpitations, chest pain, shortness of breath, orthopnea, PND, lower extremity edema, dizziness, presyncope, syncope, or neurologic sequela. + for fatigue. The patient is tolerating medications without difficulties and is otherwise without complaint today.   Past Medical History:  Diagnosis Date  . Allergic rhinitis   . Anxiety   . Arthritis   . Asthma   . Atrial flutter ([place])   . Cholelithiases 09/26/2015  . Diverticulosis   . Encounter for antineoplastic immunotherapy 12/09/2016  . GERD (gastroesophageal reflux disease)   . H/O hiatal hernia   .  History of blood transfusion   . History of chemotherapy   . History of kidney stones   . History of radiation therapy   . Internal hemorrhoid   . Lung cancer ([place])    a. Stage IB non-small cell carcinoma, s/p R VATS, wedge resection, RU lobectomy 10/2012.  . Paroxysmal atrial fibrillation ([facility])    a. anticoagulated with Xarelto  . Pneumonia 06/2016   [facility]  . Presence of permanent cardiac pacemaker   . Pulmonary nodule   . Tachycardia-bradycardia syndrome ([place])    a. 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   Past Surgical History:  Procedure Laterality Date  . CARDIOVERSION N/A 02/19/2018   Procedure: CARDIOVERSION;  Surgeon: Sanda Klein, MD;  Location: [facility];  Service: Cardiovascular;  Laterality: N/A;  . CATARACT EXTRACTION W/PHACO Right 08/14/2016   Procedure: CATARACT EXTRACTION PHACO AND INTRAOCULAR LENS PLACEMENT RIGHT EYE CDE 10.53;  Surgeon: Tonny Branch, MD;  [facility]: [facility];  Service: Ophthalmology;  Laterality: Right;  right  . CATARACT EXTRACTION W/PHACO Left 09/25/2016   Procedure: CATARACT EXTRACTION PHACO AND INTRAOCULAR LENS PLACEMENT (IOC);  Surgeon: Tonny Branch, MD;  Location: [facility];  Service: Ophthalmology;  Laterality: Left;  left CDE 8.81  . COLONOSCOPY     [facility]: cattered sigmoid diverticula, internal hemorrhoids, sessile polyp in ascending and mid transverse colon (tubular adenoma).   . colonoscopy with polypectomy  2015  . ESOPHAGOGASTRODUODENOSCOPY     [facility]: bile  reflux in stomach, mild gastritis, hiatal hernia  . ESOPHAGOGASTRODUODENOSCOPY N/A 01/31/2016   Procedure: ESOPHAGOGASTRODUODENOSCOPY (EGD);  Surgeon: [name], MD;  [facility];  Service: Endoscopy;  Laterality: N/A;  1200  . IR FLUORO GUIDE PORT INSERTION RIGHT  06/30/2017  . IR US GUIDE VASC ACCESS RIGHT  06/30/2017  . LOBECTOMY Right 10/27/2012   Procedure: LOBECTOMY;  Surgeon: [name], MD;  Location: [facility];  Service: Thoracic;  Laterality:  Right;   RIGHT UPPER LOBECTOMY & Node Dissection  . LYMPH NODE BIOPSY Right 05/01/2014   Procedure: LYMPH NODE BIOPSY, Right Axillary;  Surgeon: [name], MD;  Location: [facility];  Service: Thoracic;  Laterality: Right;  . PACEMAKER IMPLANT N/A 10/30/2017   SJM Assurity MRI PPM implanted by Dr [name] once leadless pacemaker battery depleted  . PARTIAL HYSTERECTOMY    . PERMANENT PACEMAKER INSERTION N/A 09/14/2012   Nanostim (SJM) leadless pacemaker (LEADLESS II STUDY PATIENT) implanted by Dr [name], no longer functioning, device abandoned.  [name] VIDEO ASSISTED THORACOSCOPY (VATS)/WEDGE RESECTION Right 10/27/2012   Procedure: VIDEO ASSISTED THORACOSCOPY (VATS),RGHT UPPER LOBE LUNG WEDGE RESECTION;  Surgeon: [name], MD;  Location: [facility];  Service: Thoracic;  Laterality: Right;    Current Outpatient Medications  Medication Sig Dispense Refill  . acetaminophen (TYLENOL) 500 MG tablet Take 500 mg by mouth every 6 (six) hours as needed for moderate pain or fever.     [name] albuterol (PROVENTIL HFA;VENTOLIN HFA) 108 (90 BASE) MCG/ACT inhaler Inhale 1-2 puffs into the lungs every 6 (six) hours as needed for wheezing or shortness of breath.     . CARTIA XT 240 MG 24 hr capsule TAKE 1 CAPSULE(240 MG) BY MOUTH DAILY 90 capsule 3  . flecainide (TAMBOCOR) 50 MG tablet Take 2 tablets (100 mg total) by mouth 2 (two) times daily. 180 tablet 3  . hydroxypropyl methylcellulose / hypromellose (ISOPTO TEARS / GONIOVISC) 2.5 % ophthalmic solution Place 1-2 drops into the left eye as needed for dry eyes.     [name] ipratropium-albuterol (DUONEB) 0.5-2.5 (3) MG/3ML SOLN Take 3 mLs by nebulization every 6 (six) hours as needed (shortness of breath or wheezing).     [name] lidocaine-prilocaine (EMLA) cream Apply 1 application topically as needed. (Patient taking differently: Apply 1 application topically as needed (for port). ) 30 g 4  . loratadine (CLARITIN) 10 MG tablet Take 10 mg by mouth daily.    . meclizine  (ANTIVERT) 12.5 MG tablet Take 12.5 mg by mouth 3 (three) times daily as needed for dizziness. Takes 1/2 tablet tid as needed.     . ondansetron (ZOFRAN ODT) 4 MG disintegrating tablet Take 1 tablet (4 mg total) by mouth every 8 (eight) hours as needed for nausea or vomiting. 6 tablet 0  . XARELTO 20 MG TABS tablet TAKE 1 TABLET(20 MG) BY MOUTH DAILY WITH SUPPER 30 tablet 5   No current facility-administered medications for this encounter.     Allergies  Allergen Reactions  . Chlorpheniramine-Dm Anaphylaxis and Other (See Comments)    Pt states BP dropped; developed irregular heartbeat   . Prednisone Shortness Of Breath, Palpitations and Other (See Comments)    Ddouble vision. History is not consistent with any allergy.  Sarina Ill [Sulfamethoxazole-Trimethoprim] Shortness Of Breath  . Penicillins Swelling, Rash and Other (See Comments)    Has patient had a PCN reaction causing immediate rash, facial/tongue/throat swelling, SOB or lightheadedness with hypotension: Yes Has patient had a PCN reaction causing severe  rash involving mucus membranes or skin necrosis: No Has patient had a PCN reaction that required hospitalization No Has patient had a PCN reaction occurring within the last 10 years: No If all of the above answers are "NO", then may proceed with Cephalosporin use.   . Vioxx [Rofecoxib] Other (See Comments)    Stomach cramps    Social History   Socioeconomic History  . Marital status: Single    Spouse name: Not on file  . Number of children: Not on file  . Years of education: Not on file  . Highest education level: Not on file  Occupational History  . Not on file  Social Needs  . Financial resource strain: Not on file  . Food insecurity:    Worry: Not on file    Inability: Not on file  . Transportation needs:    Medical: Not on file    Non-medical: Not on file  Tobacco Use  . Smoking status: Former Smoker    Packs/day: 2.00    Years: 40.00    Pack years: 80.00     Types: Cigarettes    Last attempt to quit: 08/05/1995    Years since quitting: 22.7  . Smokeless tobacco: Never Used  Substance and Sexual Activity  . Alcohol use: No  . Drug use: No  . Sexual activity: Not Currently  Lifestyle  . Physical activity:    Days per week: Patient refused    Minutes per session: Patient refused  . Stress: Patient refused  Relationships  . Social connections:    Talks on phone: Patient refused    Gets together: Patient refused    Attends religious service: Patient refused    Active member of club or organization: Patient refused    Attends meetings of clubs or organizations: Patient refused    Relationship status: Patient refused  . Intimate partner violence:    Fear of current or ex partner: Patient refused    Emotionally abused: Patient refused    Physically abused: Patient refused    Forced sexual activity: Patient refused  Other Topics Concern  . Not on file  Social History Narrative   Patient lives alone in [place].   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   Patient has 1 living daughter     Family History  Problem Relation Age of Onset  . Heart disease Mother   . Diabetes Mother   . Kidney cancer Mother   . Asthma Father   . Heart disease Father   . Pancreatic cancer Brother   . Diabetes Sister   . Lung cancer Sister   . Diabetes Brother   . Heart attack Son   . Colon cancer Neg Hx     ROS- All systems are reviewed and negative except as per the HPI above  Physical Exam: Vitals:   04/12/18 1101  BP: 136/74  Pulse: 79  Weight: 68.5 kg  Height: 5\' 2"  (1.575 m)   Wt Readings from Last 3 Encounters:  04/12/18 68.5 kg  04/07/18 68 kg  03/29/18 68.9 kg    Labs: Lab Results  Component Value Date   NA 140 03/16/2018   K 4.7 03/16/2018   CL 105 03/16/2018   CO2 25 03/16/2018   GLUCOSE 128 (H) 03/16/2018   BUN 14 03/16/2018   CREATININE 0.78 03/16/2018   CALCIUM 9.7 03/16/2018   PHOS 2.6 01/18/2013   MG 1.8  07/29/2014   Lab Results  Component Value Date   INR 1.17  06/30/2017   No results found for: CHOL, HDL, LDLCALC, TRIG   GEN- The patient is well appearing, alert and oriented x 3 today.   Head- normocephalic, atraumatic Eyes-  Sclera clear, conjunctiva pink Ears- hearing intact Oropharynx- clear Neck- supple, no JVP Lymph- no cervical lymphadenopathy Lungs- Clear to ausculation bilaterally, normal work of breathing Heart- irregular rate and rhythm, no murmurs, rubs or gallops, PMI not laterally displaced GI- soft, NT, ND, + BS Extremities- no clubbing, cyanosis, or edema MS- no significant deformity or atrophy Skin- no rash or lesion Psych- euthymic mood, full affect Neuro- strength and sensation are intact  EKG- afib at 65 bpm, qrs int 88 ms, qtc 393 ms    Assessment and Plan: 1. Persistent afib Successful cardioversion but with ERAF within 24 hours Dr. [name] saw pt and started flecainide 50 mg bid and she has been  increased to 100 mg bid and now is pending Cardioversion, as she has not converted, cardioversion has been set up for 9/27, delayed at pt's request for her to find a person (hopefully her daughter who is a Education officer, museum) to be with her  Continue cardizem  Bmet/cbc to be drawn at PCP closer to cardioversion Continue flecainide at 100 mg bid  2. Chadsvasc score of 2 Continue eliquis 5 mg bid, has not missed doses, reminded not to miss doses  F/u with Dr. [name] 10/4  Geroge Baseman. Rosamary Boudreau, [facility] [address] [place], [place] [address] [phone]

## 2018-04-12 NOTE — H&P (View-Only) (Signed)
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 female with a h/o PPM, afib, lung CA, that recently had a cardioversion as scheduled by Dr. [name]. It was successful but unfortunately, it lasted only 24 hours. She went to the hospital when she realized she was back in afib. Her v rates were in the 80's. She was given metoprolol 25 mg bid . She reported  that she did not feel as well on metoprolol and had noted some low BP readings, weakness and fatigue. She did not stay out long enough to appreciate if she felt better or not in SR. She has been in persistent afib for several months. She is still undergoing treatment for lung cancer.  She had  f/u of appointment, 9/4, with Dr. [name] for ERAF after successful cardioversion. She was started on  flecainide 50 mg bid with plans to increase to 100 mg bid and then cardioversion if she did not convert with the flecainide. Metoprolol was stopped as she did not tolerate.  F/u in afib clinic, 9/9. On last visit, flecainide was increased to 100 mg bid. She is tolerating ok but unfortunately it has not converted pt. She will be set up for cardioversion but pt needs to delay a couple of weeks as she has limited access to people that can be with her to drive her home form the hospital. No missed doses of xarelto.  Today, she denies symptoms of palpitations, chest pain, shortness of breath, orthopnea, PND, lower extremity edema, dizziness, presyncope, syncope, or neurologic sequela. + for fatigue. The patient is tolerating medications without difficulties and is otherwise without complaint today.   Past Medical History:  Diagnosis Date  . Allergic rhinitis   . Anxiety   . Arthritis   . Asthma   . Atrial flutter ([place])   . Cholelithiases 09/26/2015  . Diverticulosis   . Encounter for antineoplastic immunotherapy 12/09/2016  . GERD (gastroesophageal reflux disease)   . H/O hiatal hernia   .  History of blood transfusion   . History of chemotherapy   . History of kidney stones   . History of radiation therapy   . Internal hemorrhoid   . Lung cancer ([place])    a. Stage IB non-small cell carcinoma, s/p R VATS, wedge resection, RU lobectomy 10/2012.  . Paroxysmal atrial fibrillation ([facility])    a. anticoagulated with Xarelto  . Pneumonia 06/2016   [facility]  . Presence of permanent cardiac pacemaker   . Pulmonary nodule   . Tachycardia-bradycardia syndrome ([place])    a. 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   Past Surgical History:  Procedure Laterality Date  . CARDIOVERSION N/A 02/19/2018   Procedure: CARDIOVERSION;  Surgeon: Sanda Klein, MD;  Location: [facility];  Service: Cardiovascular;  Laterality: N/A;  . CATARACT EXTRACTION W/PHACO Right 08/14/2016   Procedure: CATARACT EXTRACTION PHACO AND INTRAOCULAR LENS PLACEMENT RIGHT EYE CDE 10.53;  Surgeon: Tonny Branch, MD;  [facility];  Service: Ophthalmology;  Laterality: Right;  right  . CATARACT EXTRACTION W/PHACO Left 09/25/2016   Procedure: CATARACT EXTRACTION PHACO AND INTRAOCULAR LENS PLACEMENT (IOC);  Surgeon: Tonny Branch, MD;  [facility]: [facility];  Service: Ophthalmology;  Laterality: Left;  left CDE 8.81  . COLONOSCOPY     [facility]: cattered sigmoid diverticula, internal hemorrhoids, sessile polyp in ascending and mid transverse colon (tubular adenoma).   . colonoscopy with polypectomy  2015  . ESOPHAGOGASTRODUODENOSCOPY     [facility]: bile  reflux in stomach, mild gastritis, hiatal hernia  . ESOPHAGOGASTRODUODENOSCOPY N/A 01/31/2016   Procedure: ESOPHAGOGASTRODUODENOSCOPY (EGD);  Surgeon: [name], MD;  [facility];  Service: Endoscopy;  Laterality: N/A;  1200  . IR FLUORO GUIDE PORT INSERTION RIGHT  06/30/2017  . IR US GUIDE VASC ACCESS RIGHT  06/30/2017  . LOBECTOMY Right 10/27/2012   Procedure: LOBECTOMY;  Surgeon: [name], MD;  Location: [facility];  Service: Thoracic;  Laterality:  Right;   RIGHT UPPER LOBECTOMY & Node Dissection  . LYMPH NODE BIOPSY Right 05/01/2014   Procedure: LYMPH NODE BIOPSY, Right Axillary;  Surgeon: [name], MD;  Location: [facility];  Service: Thoracic;  Laterality: Right;  . PACEMAKER IMPLANT N/A 10/30/2017   SJM Assurity MRI PPM implanted by Dr [name] once leadless pacemaker battery depleted  . PARTIAL HYSTERECTOMY    . PERMANENT PACEMAKER INSERTION N/A 09/14/2012   Nanostim (SJM) leadless pacemaker (LEADLESS II STUDY PATIENT) implanted by Dr [name], no longer functioning, device abandoned.  [name] VIDEO ASSISTED THORACOSCOPY (VATS)/WEDGE RESECTION Right 10/27/2012   Procedure: VIDEO ASSISTED THORACOSCOPY (VATS),RGHT UPPER LOBE LUNG WEDGE RESECTION;  Surgeon: [name], MD;  Location: [facility];  Service: Thoracic;  Laterality: Right;    Current Outpatient Medications  Medication Sig Dispense Refill  . acetaminophen (TYLENOL) 500 MG tablet Take 500 mg by mouth every 6 (six) hours as needed for moderate pain or fever.     [name] albuterol (PROVENTIL HFA;VENTOLIN HFA) 108 (90 BASE) MCG/ACT inhaler Inhale 1-2 puffs into the lungs every 6 (six) hours as needed for wheezing or shortness of breath.     . CARTIA XT 240 MG 24 hr capsule TAKE 1 CAPSULE(240 MG) BY MOUTH DAILY 90 capsule 3  . flecainide (TAMBOCOR) 50 MG tablet Take 2 tablets (100 mg total) by mouth 2 (two) times daily. 180 tablet 3  . hydroxypropyl methylcellulose / hypromellose (ISOPTO TEARS / GONIOVISC) 2.5 % ophthalmic solution Place 1-2 drops into the left eye as needed for dry eyes.     [name] ipratropium-albuterol (DUONEB) 0.5-2.5 (3) MG/3ML SOLN Take 3 mLs by nebulization every 6 (six) hours as needed (shortness of breath or wheezing).     [name] lidocaine-prilocaine (EMLA) cream Apply 1 application topically as needed. (Patient taking differently: Apply 1 application topically as needed (for port). ) 30 g 4  . loratadine (CLARITIN) 10 MG tablet Take 10 mg by mouth daily.    . meclizine  (ANTIVERT) 12.5 MG tablet Take 12.5 mg by mouth 3 (three) times daily as needed for dizziness. Takes 1/2 tablet tid as needed.     . ondansetron (ZOFRAN ODT) 4 MG disintegrating tablet Take 1 tablet (4 mg total) by mouth every 8 (eight) hours as needed for nausea or vomiting. 6 tablet 0  . XARELTO 20 MG TABS tablet TAKE 1 TABLET(20 MG) BY MOUTH DAILY WITH SUPPER 30 tablet 5   No current facility-administered medications for this encounter.     Allergies  Allergen Reactions  . Chlorpheniramine-Dm Anaphylaxis and Other (See Comments)    Pt states BP dropped; developed irregular heartbeat   . Prednisone Shortness Of Breath, Palpitations and Other (See Comments)    Ddouble vision. History is not consistent with any allergy.  Sarina Ill [Sulfamethoxazole-Trimethoprim] Shortness Of Breath  . Penicillins Swelling, Rash and Other (See Comments)    Has patient had a PCN reaction causing immediate rash, facial/tongue/throat swelling, SOB or lightheadedness with hypotension: Yes Has patient had a PCN reaction causing severe  rash involving mucus membranes or skin necrosis: No Has patient had a PCN reaction that required hospitalization No Has patient had a PCN reaction occurring within the last 10 years: No If all of the above answers are "NO", then may proceed with Cephalosporin use.   . Vioxx [Rofecoxib] Other (See Comments)    Stomach cramps    Social History   Socioeconomic History  . Marital status: Single    Spouse name: Not on file  . Number of children: Not on file  . Years of education: Not on file  . Highest education level: Not on file  Occupational History  . Not on file  Social Needs  . Financial resource strain: Not on file  . Food insecurity:    Worry: Not on file    Inability: Not on file  . Transportation needs:    Medical: Not on file    Non-medical: Not on file  Tobacco Use  . Smoking status: Former Smoker    Packs/day: 2.00    Years: 40.00    Pack years: 80.00     Types: Cigarettes    Last attempt to quit: 08/05/1995    Years since quitting: 22.7  . Smokeless tobacco: Never Used  Substance and Sexual Activity  . Alcohol use: No  . Drug use: No  . Sexual activity: Not Currently  Lifestyle  . Physical activity:    Days per week: Patient refused    Minutes per session: Patient refused  . Stress: Patient refused  Relationships  . Social connections:    Talks on phone: Patient refused    Gets together: Patient refused    Attends religious service: Patient refused    Active member of club or organization: Patient refused    Attends meetings of clubs or organizations: Patient refused    Relationship status: Patient refused  . Intimate partner violence:    Fear of current or ex partner: Patient refused    Emotionally abused: Patient refused    Physically abused: Patient refused    Forced sexual activity: Patient refused  Other Topics Concern  . Not on file  Social History Narrative   Patient lives alone in [place].   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   Patient has 1 living daughter     Family History  Problem Relation Age of Onset  . Heart disease Mother   . Diabetes Mother   . Kidney cancer Mother   . Asthma Father   . Heart disease Father   . Pancreatic cancer Brother   . Diabetes Sister   . Lung cancer Sister   . Diabetes Brother   . Heart attack Son   . Colon cancer Neg Hx     ROS- All systems are reviewed and negative except as per the HPI above  Physical Exam: Vitals:   04/12/18 1101  BP: 136/74  Pulse: 79  Weight: 68.5 kg  Height: 5\' 2"  (1.575 m)   Wt Readings from Last 3 Encounters:  04/12/18 68.5 kg  04/07/18 68 kg  03/29/18 68.9 kg    Labs: Lab Results  Component Value Date   NA 140 03/16/2018   K 4.7 03/16/2018   CL 105 03/16/2018   CO2 25 03/16/2018   GLUCOSE 128 (H) 03/16/2018   BUN 14 03/16/2018   CREATININE 0.78 03/16/2018   CALCIUM 9.7 03/16/2018   PHOS 2.6 01/18/2013   MG 1.8  07/29/2014   Lab Results  Component Value Date   INR 1.17  06/30/2017   No results found for: CHOL, HDL, LDLCALC, TRIG   GEN- The patient is well appearing, alert and oriented x 3 today.   Head- normocephalic, atraumatic Eyes-  Sclera clear, conjunctiva pink Ears- hearing intact Oropharynx- clear Neck- supple, no JVP Lymph- no cervical lymphadenopathy Lungs- Clear to ausculation bilaterally, normal work of breathing Heart- irregular rate and rhythm, no murmurs, rubs or gallops, PMI not laterally displaced GI- soft, NT, ND, + BS Extremities- no clubbing, cyanosis, or edema MS- no significant deformity or atrophy Skin- no rash or lesion Psych- euthymic mood, full affect Neuro- strength and sensation are intact  EKG- afib at 65 bpm, qrs int 88 ms, qtc 393 ms    Assessment and Plan: 1. Persistent afib Successful cardioversion but with ERAF within 24 hours Dr. [name] saw pt and started flecainide 50 mg bid and she has been  increased to 100 mg bid and now is pending Cardioversion, as she has not converted, cardioversion has been set up for 9/27, delayed at pt's request for her to find a person (hopefully her daughter who is a Education officer, museum) to be with her  Continue cardizem  Bmet/cbc to be drawn at PCP closer to cardioversion Continue flecainide at 100 mg bid  2. Chadsvasc score of 2 Continue eliquis 5 mg bid, has not missed doses, reminded not to miss doses  F/u with Dr. [name] 10/4  Geroge Baseman. [name] Wheller, [facility] [address] [place], [place] [address] [phone]

## 2018-04-13 ENCOUNTER — Inpatient Hospital Stay: Payer: Medicare Other | Attending: Internal Medicine

## 2018-04-13 ENCOUNTER — Ambulatory Visit (HOSPITAL_COMMUNITY): Payer: Medicare Other | Admitting: Nurse Practitioner

## 2018-04-13 ENCOUNTER — Inpatient Hospital Stay: Payer: Medicare Other

## 2018-04-13 ENCOUNTER — Inpatient Hospital Stay (HOSPITAL_BASED_OUTPATIENT_CLINIC_OR_DEPARTMENT_OTHER): Payer: Medicare Other | Admitting: Internal Medicine

## 2018-04-13 ENCOUNTER — Telehealth: Payer: Self-pay | Admitting: Internal Medicine

## 2018-04-13 ENCOUNTER — Encounter: Payer: Self-pay | Admitting: Internal Medicine

## 2018-04-13 VITALS — BP 139/63 | HR 113 | Temp 97.5°F | Resp 19 | Ht 62.0 in | Wt 150.7 lb

## 2018-04-13 DIAGNOSIS — Z923 Personal history of irradiation: Secondary | ICD-10-CM

## 2018-04-13 DIAGNOSIS — Z5112 Encounter for antineoplastic immunotherapy: Secondary | ICD-10-CM | POA: Diagnosis present

## 2018-04-13 DIAGNOSIS — C3411 Malignant neoplasm of upper lobe, right bronchus or lung: Secondary | ICD-10-CM

## 2018-04-13 DIAGNOSIS — C3491 Malignant neoplasm of unspecified part of right bronchus or lung: Secondary | ICD-10-CM

## 2018-04-13 DIAGNOSIS — I480 Paroxysmal atrial fibrillation: Secondary | ICD-10-CM

## 2018-04-13 DIAGNOSIS — C773 Secondary and unspecified malignant neoplasm of axilla and upper limb lymph nodes: Secondary | ICD-10-CM | POA: Diagnosis present

## 2018-04-13 DIAGNOSIS — Z79899 Other long term (current) drug therapy: Secondary | ICD-10-CM | POA: Insufficient documentation

## 2018-04-13 DIAGNOSIS — Z95828 Presence of other vascular implants and grafts: Secondary | ICD-10-CM

## 2018-04-13 DIAGNOSIS — C3490 Malignant neoplasm of unspecified part of unspecified bronchus or lung: Secondary | ICD-10-CM

## 2018-04-13 LAB — CMP (CANCER CENTER ONLY)
ALBUMIN: 3.9 g/dL (ref 3.5–5.0)
ALT: 27 U/L (ref 0–44)
ANION GAP: 7 (ref 5–15)
AST: 31 U/L (ref 15–41)
Alkaline Phosphatase: 116 U/L (ref 38–126)
BILIRUBIN TOTAL: 1.4 mg/dL — AB (ref 0.3–1.2)
BUN: 14 mg/dL (ref 8–23)
CHLORIDE: 104 mmol/L (ref 98–111)
CO2: 28 mmol/L (ref 22–32)
Calcium: 10 mg/dL (ref 8.9–10.3)
Creatinine: 0.77 mg/dL (ref 0.44–1.00)
GFR, Est AFR Am: >60 mL/min (ref >60)
GFR, Estimated: >60 mL/min (ref >60)
GLUCOSE: 113 mg/dL — AB (ref 70–99)
POTASSIUM: 4.5 mmol/L (ref 3.5–5.1)
SODIUM: 139 mmol/L (ref 135–145)
TOTAL PROTEIN: 7.2 g/dL (ref 6.5–8.1)

## 2018-04-13 LAB — CBC WITH DIFFERENTIAL (CANCER CENTER ONLY)
BASOS ABS: 0.1 10*3/uL (ref 0.0–0.1)
Basophils Relative: 1 %
Eosinophils Absolute: 0.1 10*3/uL (ref 0.0–0.5)
Eosinophils Relative: 2 %
HCT: 36.5 % (ref 34.8–46.6)
Hemoglobin: 12.5 g/dL (ref 11.6–15.9)
LYMPHS PCT: 25 %
Lymphs Abs: 1.3 10*3/uL (ref 0.9–3.3)
MCH: 32.5 pg (ref 25.1–34.0)
MCHC: 34.2 g/dL (ref 31.5–36.0)
MCV: 95 fL (ref 79.5–101.0)
MONO ABS: 0.4 10*3/uL (ref 0.1–0.9)
Monocytes Relative: 8 %
Neutro Abs: 3.5 10*3/uL (ref 1.5–6.5)
Neutrophils Relative %: 64 %
Platelet Count: 206 10*3/uL (ref 145–400)
RBC: 3.84 MIL/uL (ref 3.70–5.45)
RDW: 14.5 % (ref 11.2–14.5)
WBC: 5.4 10*3/uL (ref 3.9–10.3)

## 2018-04-13 LAB — TSH: TSH: 2.388 u[IU]/mL (ref 0.308–3.960)

## 2018-04-13 MED ORDER — SODIUM CHLORIDE 0.9% FLUSH
10.0000 mL | INTRAVENOUS | Status: DC | PRN
  Administered 2018-04-13: 10 mL
  Filled 2018-04-13: qty 10

## 2018-04-13 MED ORDER — SODIUM CHLORIDE 0.9% FLUSH
10.0000 mL | INTRAVENOUS | Status: DC | PRN
  Administered 2018-04-13: 10 mL via INTRAVENOUS
  Filled 2018-04-13: qty 10

## 2018-04-13 MED ORDER — HEPARIN SOD (PORK) LOCK FLUSH 100 UNIT/ML IV SOLN
500.0000 [IU] | Freq: Once | INTRAVENOUS | Status: AC | PRN
  Administered 2018-04-13: 500 [IU]
  Filled 2018-04-13: qty 5

## 2018-04-13 MED ORDER — SODIUM CHLORIDE 0.9 % IV SOLN
Freq: Once | INTRAVENOUS | Status: AC
  Administered 2018-04-13: 14:00:00 via INTRAVENOUS
  Filled 2018-04-13: qty 250

## 2018-04-13 MED ORDER — SODIUM CHLORIDE 0.9 % IV SOLN
480.0000 mg | Freq: Once | INTRAVENOUS | Status: AC
  Administered 2018-04-13: 480 mg via INTRAVENOUS
  Filled 2018-04-13: qty 48

## 2018-04-13 NOTE — Progress Notes (Signed)
[place] [phone]   [phone]  OFFICE PROGRESS NOTE  Harris, [name], [name] [address] [place] [address]  DIAGNOSIS: Metastatic non-small cell lung cancer, adenocarcinoma. This was initially diagnosed as stage IB (T2a, N0, M0) in March of 2014, status post right upper lobectomy with lymph node dissection but the patient has evidence for disease recurrence in the right axilla status post resection.  PRIOR THERAPY:  1) Status post right axillary lymph node biopsy on 05/01/2014 under the care of Dr. Roxan Hockey. 2) Systemic chemotherapy with carboplatin for AUC of 5 and Alimta 500 mg/M2 every 3 weeks. First cycle 05/31/2014. Status post 6 cycles. Last dose was given 09/22/2014. 3) palliative radiotherapy to the subcarinal lymph node under the care of Dr. Sondra Come. 4) Nivolumab 240 mg IV every 2 weeks. First dose 12/23/2016. Status post 6 cycles.  CURRENT THERAPY:  Nivolumab 480 mg IV every 4 weeks. First dose 03/17/2017. Status post 14 cycles.  INTERVAL HISTORY: Nancy Hodges 72 y.o. female returns to the clinic today for follow-up visit.  The patient is feeling well today with no specific complaints except for mild fatigue.  She is scheduled to have cardioversion by her cardiologist in the next few weeks.  She denied having any current chest pain, shortness breath, cough or hemoptysis.  She denied having any fever or chills.  She has no nausea, vomiting, diarrhea or constipation.  She continues to tolerate her treatment with Nivolumab fairly well.  She is here today for evaluation before starting cycle #15.  MEDICAL HISTORY: Past Medical History:  Diagnosis Date  . Allergic rhinitis   . Anxiety   . Arthritis   . Asthma   . Atrial flutter ([place])   . Cholelithiases 09/26/2015  . Diverticulosis   . Encounter for antineoplastic immunotherapy 12/09/2016  . GERD (gastroesophageal reflux disease)   . H/O hiatal hernia   . History of  blood transfusion   . History of chemotherapy   . History of kidney stones   . History of radiation therapy   . Internal hemorrhoid   . Lung cancer ([place])    a. Stage IB non-small cell carcinoma, s/p R VATS, wedge resection, RU lobectomy 10/2012.  . Paroxysmal atrial fibrillation ([facility])    a. anticoagulated with Xarelto  . Pneumonia 06/2016   [facility]  . Presence of permanent cardiac pacemaker   . Pulmonary nodule   . Tachycardia-bradycardia syndrome ([place])    a. 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    ALLERGIES:  is allergic to chlorpheniramine-dm; prednisone; septra [sulfamethoxazole-trimethoprim]; penicillins; and vioxx [rofecoxib].  MEDICATIONS:  Current Outpatient Medications  Medication Sig Dispense Refill  . acetaminophen (TYLENOL) 500 MG tablet Take 500 mg by mouth every 6 (six) hours as needed for moderate pain or fever.     [name] albuterol (PROVENTIL HFA;VENTOLIN HFA) 108 (90 BASE) MCG/ACT inhaler Inhale 1-2 puffs into the lungs every 6 (six) hours as needed for wheezing or shortness of breath.     . CARTIA XT 240 MG 24 hr capsule TAKE 1 CAPSULE(240 MG) BY MOUTH DAILY 90 capsule 3  . flecainide (TAMBOCOR) 100 MG tablet Take 1 tablet (100 mg total) by mouth 2 (two) times daily. 180 tablet 2  . hydroxypropyl methylcellulose / hypromellose (ISOPTO TEARS / GONIOVISC) 2.5 % ophthalmic solution Place 1-2 drops into the left eye as needed for dry eyes.     [name] ipratropium-albuterol (DUONEB) 0.5-2.5 (3) MG/3ML SOLN Take 3 mLs  by nebulization every 6 (six) hours as needed (shortness of breath or wheezing).     [name] lidocaine-prilocaine (EMLA) cream Apply 1 application topically as needed. (Patient taking differently: Apply 1 application topically as needed (for port). ) 30 g 4  . loratadine (CLARITIN) 10 MG tablet Take 10 mg by mouth daily.    . meclizine (ANTIVERT) 12.5 MG tablet Take 12.5 mg by mouth 3 (three) times daily as needed for dizziness. Takes 1/2 tablet tid as needed.     .  ondansetron (ZOFRAN ODT) 4 MG disintegrating tablet Take 1 tablet (4 mg total) by mouth every 8 (eight) hours as needed for nausea or vomiting. 6 tablet 0  . XARELTO 20 MG TABS tablet TAKE 1 TABLET(20 MG) BY MOUTH DAILY WITH SUPPER 30 tablet 5   No current facility-administered medications for this visit.    Facility-Administered Medications Ordered in Other Visits  Medication Dose Route Frequency Provider Last Rate Last Dose  . sodium chloride flush (NS) 0.9 % injection 10 mL  10 mL Intravenous PRN Curt Bears, MD   10 mL at 04/13/18 1220    SURGICAL HISTORY:  Past Surgical History:  Procedure Laterality Date  . CARDIOVERSION N/A 02/19/2018   Procedure: CARDIOVERSION;  Surgeon: Sanda Klein, MD;  Location: [facility];  Service: Cardiovascular;  Laterality: N/A;  . CATARACT EXTRACTION W/PHACO Right 08/14/2016   Procedure: CATARACT EXTRACTION PHACO AND INTRAOCULAR LENS PLACEMENT RIGHT EYE CDE 10.53;  Surgeon: Tonny Branch, MD;  Location: [facility];  Service: Ophthalmology;  Laterality: Right;  right  . CATARACT EXTRACTION W/PHACO Left 09/25/2016   Procedure: CATARACT EXTRACTION PHACO AND INTRAOCULAR LENS PLACEMENT (IOC);  Surgeon: Tonny Branch, MD;  Location: [facility];  Service: Ophthalmology;  Laterality: Left;  left CDE 8.81  . COLONOSCOPY     [facility]: cattered sigmoid diverticula, internal hemorrhoids, sessile polyp in ascending and mid transverse colon (tubular adenoma).   . colonoscopy with polypectomy  2015  . ESOPHAGOGASTRODUODENOSCOPY     [facility]: bile reflux in stomach, mild gastritis, hiatal hernia  . ESOPHAGOGASTRODUODENOSCOPY N/A 01/31/2016   Procedure: ESOPHAGOGASTRODUODENOSCOPY (EGD);  Surgeon: [name], MD;  [facility];  Service: Endoscopy;  Laterality: N/A;  1200  . IR FLUORO GUIDE PORT INSERTION RIGHT  06/30/2017  . IR US GUIDE VASC ACCESS RIGHT  06/30/2017  . LOBECTOMY Right 10/27/2012   Procedure: LOBECTOMY;  Surgeon: [name], MD;   Location: [facility];  Service: Thoracic;  Laterality: Right;   RIGHT UPPER LOBECTOMY & Node Dissection  . LYMPH NODE BIOPSY Right 05/01/2014   Procedure: LYMPH NODE BIOPSY, Right Axillary;  Surgeon: [name], MD;  Location: [facility];  Service: Thoracic;  Laterality: Right;  . PACEMAKER IMPLANT N/A 10/30/2017   SJM Assurity MRI PPM implanted by Dr [name] once leadless pacemaker battery depleted  . PARTIAL HYSTERECTOMY    . PERMANENT PACEMAKER INSERTION N/A 09/14/2012   Nanostim (SJM) leadless pacemaker (LEADLESS II STUDY PATIENT) implanted by Dr [name], no longer functioning, device abandoned.  [name] VIDEO ASSISTED THORACOSCOPY (VATS)/WEDGE RESECTION Right 10/27/2012   Procedure: VIDEO ASSISTED THORACOSCOPY (VATS),RGHT UPPER LOBE LUNG WEDGE RESECTION;  Surgeon: [name], MD;  Location: [facility];  Service: Thoracic;  Laterality: Right;    REVIEW OF SYSTEMS:  A comprehensive review of systems was negative except for: Constitutional: positive for fatigue   PHYSICAL EXAMINATION: General appearance: alert, cooperative, fatigued and no distress Head: Normocephalic, without obvious abnormality, atraumatic Neck: no adenopathy, no JVD, supple, symmetrical, trachea midline  and thyroid not enlarged, symmetric, no tenderness/mass/nodules Lymph nodes: Cervical, supraclavicular, and axillary nodes normal. Resp: clear to auscultation bilaterally Back: symmetric, no curvature. ROM normal. No CVA tenderness. Cardio: regular rate and rhythm, S1, S2 normal, no murmur, click, rub or gallop GI: soft, non-tender; bowel sounds normal; no masses,  no organomegaly Extremities: extremities normal, atraumatic, no cyanosis or edema  ECOG PERFORMANCE STATUS: 1 - Symptomatic but completely ambulatory  Blood pressure 139/63, pulse (!) 113, temperature (!) 97.5 F (36.4 C), temperature source Oral, resp. rate 19, height 5\' 2"  (1.575 m), weight 150 lb 11.2 oz (68.4 kg), SpO2 97 %.  LABORATORY DATA: Lab Results    Component Value Date   WBC 5.4 04/13/2018   HGB 12.5 04/13/2018   HCT 36.5 04/13/2018   MCV 95.0 04/13/2018   PLT 206 04/13/2018      Chemistry      Component Value Date/Time   NA 140 03/16/2018 1004   NA 139 08/05/2017 0902   K 4.7 03/16/2018 1004   K 4.1 08/05/2017 0902   CL 105 03/16/2018 1004   CO2 25 03/16/2018 1004   CO2 28 08/05/2017 0902   BUN 14 03/16/2018 1004   BUN 10.1 08/05/2017 0902   CREATININE 0.78 03/16/2018 1004   CREATININE 0.8 08/05/2017 0902   GLU 134 (H) 02/18/2017 1410      Component Value Date/Time   CALCIUM 9.7 03/16/2018 1004   CALCIUM 9.5 08/05/2017 0902   ALKPHOS 108 03/16/2018 1004   ALKPHOS 111 08/05/2017 0902   AST 29 03/16/2018 1004   AST 33 08/05/2017 0902   ALT 24 03/16/2018 1004   ALT 34 08/05/2017 0902   BILITOT 1.6 (H) 03/16/2018 1004   BILITOT 1.39 (H) 08/05/2017 0902       RADIOGRAPHIC STUDIES: No results found. ASSESSMENT AND PLAN:  This is a very pleasant 72 years old white female with metastatic non-small cell lung cancer, adenocarcinoma with no actionable mutations status post systemic chemotherapy with carboplatin and Alimta and she is currently undergoing treatment with second line immunotherapy with Nivolumab status post 6 cycles. This was followed by immunotherapy with Nivolumab 480 mg IV every 4 weeks status post 14 cycles. The patient continues to tolerate her treatment with immunotherapy fairly well with no concerning adverse effects. I recommended for her to proceed with cycle #15 today as scheduled. I will see her back for follow-up visit in 4 weeks for evaluation after repeating CT scan of the chest, abdomen and pelvis for restaging of her disease. The patient was advised to call immediately if she has any concerning symptoms in the interval. The patient voices understanding of current disease status and treatment options and is in agreement with the current care plan. All questions were answered. The patient  knows to call the clinic with any problems, questions or concerns. We can certainly see the patient much sooner if necessary.  Disclaimer: This note was dictated with voice recognition software. Similar sounding words can inadvertently be transcribed and may not be corrected upon review.

## 2018-04-13 NOTE — Telephone Encounter (Signed)
Scheduled appt per 9/10 los - gave patient AVS and calender per los.   

## 2018-04-13 NOTE — Patient Instructions (Signed)
Cancer Center Discharge Instructions for Patients Receiving Chemotherapy  Today you received the following chemotherapy agents :  Opdivo.  To help prevent nausea and vomiting after your treatment, we encourage you to take your nausea medication as prescribed.   If you develop nausea and vomiting that is not controlled by your nausea medication, call the clinic.   BELOW ARE SYMPTOMS THAT SHOULD BE REPORTED IMMEDIATELY:  *FEVER GREATER THAN 100.5 F  *CHILLS WITH OR WITHOUT FEVER  NAUSEA AND VOMITING THAT IS NOT CONTROLLED WITH YOUR NAUSEA MEDICATION  *UNUSUAL SHORTNESS OF BREATH  *UNUSUAL BRUISING OR BLEEDING  TENDERNESS IN MOUTH AND THROAT WITH OR WITHOUT PRESENCE OF ULCERS  *URINARY PROBLEMS  *BOWEL PROBLEMS  UNUSUAL RASH Items with * indicate a potential emergency and should be followed up as soon as possible.  Feel free to call the clinic should you have any questions or concerns. The clinic phone number is [phone].  Please show the CHEMO ALERT CARD at check-in to the Emergency Department and triage nurse.   

## 2018-04-15 ENCOUNTER — Telehealth: Payer: Self-pay | Admitting: *Deleted

## 2018-04-15 ENCOUNTER — Encounter: Payer: Self-pay | Admitting: *Deleted

## 2018-04-15 DIAGNOSIS — C3491 Malignant neoplasm of unspecified part of right bronchus or lung: Secondary | ICD-10-CM

## 2018-04-15 NOTE — Telephone Encounter (Signed)
Oncology Nurse Navigator Documentation  Oncology Nurse Navigator Flowsheets 04/15/2018  Navigator Location [facility]  Navigator Encounter Type Telephone/I updated Ms. Hildebran on order change and gave her the number to call for appt. She was thankful for the help.   Telephone Incoming Call  Treatment Phase Treatment  Barriers/Navigation Needs Education;Coordination of Care  Education Other  Interventions Coordination of Care;Education  Coordination of Care Other  Education Method Verbal  Acuity Level 2  Time Spent with Patient 30

## 2018-04-15 NOTE — Progress Notes (Signed)
Oncology Nurse Navigator Documentation  Oncology Nurse Navigator Flowsheets 04/15/2018  Navigator Location [facility]  Navigator Encounter Type Telephone/patient called and left vm message.  I called her back but was unable to reach her.  She would like ct scan at [facility].  This is ok with Dr. Julien Nordmann.  I contacted central scheduling to understand what to do next for the order.  I clarified order.  They will call her with appt   Telephone Incoming Call;Outgoing Call  Treatment Phase Treatment  Barriers/Navigation Needs Coordination of Care  Interventions Coordination of Care  Coordination of Care Other  Acuity Level 2  Time Spent with Patient 30

## 2018-04-30 ENCOUNTER — Encounter (HOSPITAL_COMMUNITY): Payer: Self-pay | Admitting: *Deleted

## 2018-04-30 ENCOUNTER — Ambulatory Visit (HOSPITAL_COMMUNITY)
Admission: RE | Admit: 2018-04-30 | Discharge: 2018-04-30 | Disposition: A | Discharge status: Home / Self Care | Payer: Medicare Other | Source: Ambulatory Visit | Attending: Cardiovascular Disease | Admitting: Cardiovascular Disease

## 2018-04-30 ENCOUNTER — Ambulatory Visit (HOSPITAL_COMMUNITY): Payer: Medicare Other | Admitting: Anesthesiology

## 2018-04-30 ENCOUNTER — Encounter (HOSPITAL_COMMUNITY)
Admission: RE | Disposition: A | Discharge status: Home / Self Care | Payer: Self-pay | Source: Ambulatory Visit | Attending: Cardiovascular Disease

## 2018-04-30 DIAGNOSIS — Z9842 Cataract extraction status, left eye: Secondary | ICD-10-CM | POA: Diagnosis not present

## 2018-04-30 DIAGNOSIS — Z880 Allergy status to penicillin: Secondary | ICD-10-CM | POA: Insufficient documentation

## 2018-04-30 DIAGNOSIS — Z87891 Personal history of nicotine dependence: Secondary | ICD-10-CM | POA: Diagnosis not present

## 2018-04-30 DIAGNOSIS — J45909 Unspecified asthma, uncomplicated: Secondary | ICD-10-CM | POA: Diagnosis not present

## 2018-04-30 DIAGNOSIS — Z7901 Long term (current) use of anticoagulants: Secondary | ICD-10-CM | POA: Diagnosis not present

## 2018-04-30 DIAGNOSIS — Z85118 Personal history of other malignant neoplasm of bronchus and lung: Secondary | ICD-10-CM | POA: Diagnosis not present

## 2018-04-30 DIAGNOSIS — Z87442 Personal history of urinary calculi: Secondary | ICD-10-CM | POA: Diagnosis not present

## 2018-04-30 DIAGNOSIS — I480 Paroxysmal atrial fibrillation: Secondary | ICD-10-CM | POA: Insufficient documentation

## 2018-04-30 DIAGNOSIS — Z902 Acquired absence of lung [part of]: Secondary | ICD-10-CM | POA: Diagnosis not present

## 2018-04-30 DIAGNOSIS — Z808 Family history of malignant neoplasm of other organs or systems: Secondary | ICD-10-CM | POA: Insufficient documentation

## 2018-04-30 DIAGNOSIS — Z881 Allergy status to other antibiotic agents status: Secondary | ICD-10-CM | POA: Insufficient documentation

## 2018-04-30 DIAGNOSIS — Z9221 Personal history of antineoplastic chemotherapy: Secondary | ICD-10-CM | POA: Insufficient documentation

## 2018-04-30 DIAGNOSIS — Z950 Presence of cardiac pacemaker: Secondary | ICD-10-CM | POA: Diagnosis not present

## 2018-04-30 DIAGNOSIS — Z882 Allergy status to sulfonamides status: Secondary | ICD-10-CM | POA: Insufficient documentation

## 2018-04-30 DIAGNOSIS — Z825 Family history of asthma and other chronic lower respiratory diseases: Secondary | ICD-10-CM | POA: Diagnosis not present

## 2018-04-30 DIAGNOSIS — Z79899 Other long term (current) drug therapy: Secondary | ICD-10-CM | POA: Insufficient documentation

## 2018-04-30 DIAGNOSIS — Z923 Personal history of irradiation: Secondary | ICD-10-CM | POA: Diagnosis not present

## 2018-04-30 DIAGNOSIS — I4892 Unspecified atrial flutter: Secondary | ICD-10-CM | POA: Diagnosis not present

## 2018-04-30 DIAGNOSIS — I481 Persistent atrial fibrillation: Secondary | ICD-10-CM | POA: Insufficient documentation

## 2018-04-30 DIAGNOSIS — K219 Gastro-esophageal reflux disease without esophagitis: Secondary | ICD-10-CM | POA: Diagnosis not present

## 2018-04-30 DIAGNOSIS — Z90711 Acquired absence of uterus with remaining cervical stump: Secondary | ICD-10-CM | POA: Diagnosis not present

## 2018-04-30 DIAGNOSIS — Z801 Family history of malignant neoplasm of trachea, bronchus and lung: Secondary | ICD-10-CM | POA: Insufficient documentation

## 2018-04-30 DIAGNOSIS — Z9889 Other specified postprocedural states: Secondary | ICD-10-CM | POA: Diagnosis not present

## 2018-04-30 DIAGNOSIS — Z8249 Family history of ischemic heart disease and other diseases of the circulatory system: Secondary | ICD-10-CM | POA: Insufficient documentation

## 2018-04-30 DIAGNOSIS — Z9841 Cataract extraction status, right eye: Secondary | ICD-10-CM | POA: Insufficient documentation

## 2018-04-30 DIAGNOSIS — Z888 Allergy status to other drugs, medicaments and biological substances status: Secondary | ICD-10-CM | POA: Insufficient documentation

## 2018-04-30 DIAGNOSIS — Z8051 Family history of malignant neoplasm of kidney: Secondary | ICD-10-CM | POA: Insufficient documentation

## 2018-04-30 DIAGNOSIS — Z833 Family history of diabetes mellitus: Secondary | ICD-10-CM | POA: Insufficient documentation

## 2018-04-30 DIAGNOSIS — M1990 Unspecified osteoarthritis, unspecified site: Secondary | ICD-10-CM | POA: Diagnosis not present

## 2018-04-30 DIAGNOSIS — I4819 Other persistent atrial fibrillation: Secondary | ICD-10-CM

## 2018-04-30 HISTORY — PX: CARDIOVERSION: SHX1299

## 2018-04-30 SURGERY — CARDIOVERSION
Anesthesia: General

## 2018-04-30 MED ORDER — SODIUM CHLORIDE 0.9 % IV SOLN
INTRAVENOUS | Status: DC
  Administered 2018-04-30: 12:00:00 via INTRAVENOUS

## 2018-04-30 MED ORDER — LIDOCAINE 2% (20 MG/ML) 5 ML SYRINGE
INTRAMUSCULAR | Status: DC | PRN
  Administered 2018-04-30: 100 mg via INTRAVENOUS

## 2018-04-30 MED ORDER — PROPOFOL 10 MG/ML IV BOLUS
INTRAVENOUS | Status: DC | PRN
  Administered 2018-04-30: 60 mg via INTRAVENOUS

## 2018-04-30 NOTE — Anesthesia Preprocedure Evaluation (Addendum)
Anesthesia Evaluation  Patient identified by MRN, date of birth, ID band Patient awake    Reviewed: Allergy & Precautions, NPO status , Patient's Chart, lab work & pertinent test results  Airway Mallampati: II  TM Distance: >3 FB Neck ROM: Full    Dental no notable dental hx. (+) Partial Upper, Partial Lower   Pulmonary COPD, former smoker,    Pulmonary exam normal breath sounds clear to auscultation       Cardiovascular Exercise Tolerance: Good Normal cardiovascular exam+ pacemaker  Rhythm:Regular Rate:Normal  Echo 10/28/2017 Left ventricle: The cavity size was normal. Wall thickness was   normal. Systolic function was normal. The estimated ejection   fraction was in the range of 60% to 65%. Wall motion was normal;   there were no regional wall motion abnormalities. Doppler   parameters are consistent with pseudonormal left ventricular   relaxation (grade 2 diastolic dysfunction). The E/e&' ratio is   >15, suggesting elevated LV filling pressure.   Neuro/Psych Anxiety    GI/Hepatic Neg liver ROS, GERD  ,  Endo/Other    Renal/GU      Musculoskeletal  (+) Arthritis ,   Abdominal   Peds  Hematology   Anesthesia Other Findings   Reproductive/Obstetrics                            Anesthesia Physical Anesthesia Plan  ASA: II  Anesthesia Plan: General   Post-op Pain Management:    Induction: Intravenous  PONV Risk Score and Plan:   Airway Management Planned: Natural Airway and Nasal Cannula  Additional Equipment:   Intra-op Plan:   Post-operative Plan:   Informed Consent: I have reviewed the patients History and Physical, chart, labs and discussed the procedure including the risks, benefits and alternatives for the proposed anesthesia with the patient or authorized representative who has indicated his/her understanding and acceptance.     Plan Discussed with:   Anesthesia  Plan Comments:         Anesthesia Quick Evaluation

## 2018-04-30 NOTE — Anesthesia Procedure Notes (Signed)
Procedure Name: MAC Date/Time: 04/30/2018 12:14 PM Performed by: Imagene Riches, CRNA Pre-anesthesia Checklist: Patient identified, Emergency Drugs available, Suction available and Patient being monitored Patient Re-evaluated:Patient Re-evaluated prior to induction Oxygen Delivery Method: Ambu bag Preoxygenation: Pre-oxygenation with 100% oxygen

## 2018-04-30 NOTE — Transfer of Care (Signed)
Immediate Anesthesia Transfer of Care Note  Patient: Nancy Hodges  Procedure(s) Performed: CARDIOVERSION (N/A )  Patient Location: Endoscopy Unit  Anesthesia Type:General  Level of Consciousness: drowsy  Airway & Oxygen Therapy: Patient Spontanous Breathing  Post-op Assessment: Report given to RN and Post -op Vital signs reviewed and stable  Post vital signs: Reviewed and stable  Last Vitals:  Vitals Value Taken Time  BP    Temp    Pulse    Resp    SpO2      Last Pain:  Vitals:   04/30/18 1140  TempSrc: Oral  PainSc: 0-No pain         Complications: No apparent anesthesia complications

## 2018-04-30 NOTE — CV Procedure (Signed)
    Cardioversion Note  ATZIRI ZUBIATE [id number] 05/29/1946  Procedure: DC Cardioversion Indications: Atrial fib   Procedure Details Consent: Obtained Time Out: Verified patient identification, verified procedure, site/side was marked, verified correct patient position, special equipment/implants available, Radiology Safety Procedures followed,  medications/allergies/relevent history reviewed, required imaging and test results available.  Performed  The patient has been on adequate anticoagulation.  The patient received IV Lidocaine 100 mg followed by Propofol 60 mg IV  for sedation.  Synchronous cardioversion was performed at 120  joules.  The cardioversion was successful  AV pacing was verified by Jarrett Soho , pacer rep    Complications: No apparent complications Patient did tolerate procedure well.   Thayer Headings, Brooke Bonito., MD, [facility] 04/30/2018, 12:24 PM

## 2018-04-30 NOTE — Anesthesia Postprocedure Evaluation (Signed)
Anesthesia Post Note  Patient: Nancy Hodges  Procedure(s) Performed: CARDIOVERSION (N/A )     Patient location during evaluation: PACU Anesthesia Type: General Level of consciousness: awake and alert Pain management: pain level controlled Vital Signs Assessment: post-procedure vital signs reviewed and stable Respiratory status: spontaneous breathing, nonlabored ventilation, respiratory function stable and patient connected to nasal cannula oxygen Cardiovascular status: blood pressure returned to baseline and stable Postop Assessment: no apparent nausea or vomiting Anesthetic complications: no    Last Vitals:  Vitals:   04/30/18 1240 04/30/18 1250  BP: (!) 131/53 (!) 142/55  Pulse: (!) 58 (!) 59  Resp: 17 15  Temp:    SpO2: 98% 98%    Last Pain:  Vitals:   04/30/18 1250  TempSrc:   PainSc: 0-No pain                 Barnet Glasgow

## 2018-04-30 NOTE — Interval H&P Note (Signed)
History and Physical Interval Note:  04/30/2018 11:16 AM  Filbert Berthold  has presented today for surgery, with the diagnosis of AFIB  The various methods of treatment have been discussed with the patient and family. After consideration of risks, benefits and other options for treatment, the patient has consented to  Procedure(s): CARDIOVERSION (N/A) as a surgical intervention .  The patient's history has been reviewed, patient examined, no change in status, stable for surgery.  I have reviewed the patient's chart and labs.  Questions were answered to the patient's satisfaction.     Mertie Moores

## 2018-04-30 NOTE — Discharge Instructions (Signed)
Electrical Cardioversion, Care After °This sheet gives you information about how to care for yourself after your procedure. Your health care provider may also give you more specific instructions. If you have problems or questions, contact your health care provider. °What can I expect after the procedure? °After the procedure, it is common to have: °· Some redness on the skin where the shocks were given. ° °Follow these instructions at home: °· Do not drive for 24 hours if you were given a medicine to help you relax (sedative). °· Take over-the-counter and prescription medicines only as told by your health care provider. °· Ask your health care provider how to check your pulse. Check it often. °· Rest for 48 hours after the procedure or as told by your health care provider. °· Avoid or limit your caffeine use as told by your health care provider. °Contact a health care provider if: °· You feel like your heart is beating too quickly or your pulse is not regular. °· You have a serious muscle cramp that does not go away. °Get help right away if: °· You have discomfort in your chest. °· You are dizzy or you feel faint. °· You have trouble breathing or you are short of breath. °· Your speech is slurred. °· You have trouble moving an arm or leg on one side of your body. °· Your fingers or toes turn cold or blue. °This information is not intended to replace advice given to you by your health care provider. Make sure you discuss any questions you have with your health care provider. °Document Released: 05/11/2013 Document Revised: 02/22/2016 Document Reviewed: 01/25/2016 °Elsevier Interactive Patient Education © 2018 [employer] ° °

## 2018-05-01 ENCOUNTER — Encounter (HOSPITAL_COMMUNITY): Payer: Self-pay | Admitting: Cardiovascular Disease

## 2018-05-03 ENCOUNTER — Ambulatory Visit (INDEPENDENT_AMBULATORY_CARE_PROVIDER_SITE_OTHER): Payer: Medicare Other | Admitting: *Deleted

## 2018-05-03 DIAGNOSIS — I495 Sick sinus syndrome: Secondary | ICD-10-CM | POA: Diagnosis not present

## 2018-05-03 NOTE — Progress Notes (Signed)
Remote pacemaker transmission.   

## 2018-05-06 ENCOUNTER — Ambulatory Visit (HOSPITAL_COMMUNITY)
Admission: RE | Admit: 2018-05-06 | Discharge: 2018-05-06 | Disposition: A | Discharge status: Home / Self Care | Payer: Medicare Other | Source: Ambulatory Visit | Attending: Internal Medicine | Admitting: Internal Medicine

## 2018-05-06 DIAGNOSIS — C3491 Malignant neoplasm of unspecified part of right bronchus or lung: Secondary | ICD-10-CM | POA: Insufficient documentation

## 2018-05-06 MED ORDER — HEPARIN SOD (PORK) LOCK FLUSH 100 UNIT/ML IV SOLN
INTRAVENOUS | Status: AC
  Filled 2018-05-06: qty 5

## 2018-05-06 MED ORDER — IOPAMIDOL (ISOVUE-300) INJECTION 61%
100.0000 mL | Freq: Once | INTRAVENOUS | Status: AC | PRN
  Administered 2018-05-06: 100 mL via INTRAVENOUS

## 2018-05-07 ENCOUNTER — Encounter: Payer: Self-pay | Admitting: Internal Medicine

## 2018-05-07 ENCOUNTER — Ambulatory Visit (INDEPENDENT_AMBULATORY_CARE_PROVIDER_SITE_OTHER): Payer: Medicare Other | Admitting: Internal Medicine

## 2018-05-07 VITALS — BP 150/82 | HR 83 | Ht 62.0 in | Wt 152.0 lb

## 2018-05-07 DIAGNOSIS — I495 Sick sinus syndrome: Secondary | ICD-10-CM

## 2018-05-07 DIAGNOSIS — Z950 Presence of cardiac pacemaker: Secondary | ICD-10-CM

## 2018-05-07 DIAGNOSIS — I4819 Other persistent atrial fibrillation: Secondary | ICD-10-CM

## 2018-05-07 NOTE — Patient Instructions (Addendum)
Medication Instructions:   Stop Flecainide.   Continue all other medications.    Labwork: none  Testing/Procedures: Afib Ablation - Sonia Baller will call you to schedule.   Follow-Up: To be determined   Any Other Special Instructions Will Be Listed Below (If Applicable). Remote monitoring is used to monitor your Pacemaker of ICD from home. This monitoring reduces the number of office visits required to check your device to one time per year. It allows [place] to keep an eye on the functioning of your device to ensure it is working properly. You are scheduled for a device check from home on 08/02/2018. You may send your transmission at any time that day. If you have a wireless device, the transmission will be sent automatically. After your physician reviews your transmission, you will receive a postcard with your next transmission date.  If you need a refill on your cardiac medications before your next appointment, please call your pharmacy.

## 2018-05-07 NOTE — Progress Notes (Signed)
PCP: [name], [name]   Primary EP:  Dr Lillette Boxer Nancy Hodges is a 72 y.o. female who presents today for routine electrophysiology followup.  Since last being seen in our clinic, the patient reports doing reasonably well.  She was recently cardioverted but has quickly returned to afib.  She has fatigue and decreased exercise tolerance.  Today, she denies symptoms of palpitations, chest pain, shortness of breath,  lower extremity edema, dizziness, presyncope, or syncope.  The patient is otherwise without complaint today.   Past Medical History:  Diagnosis Date  . Allergic rhinitis   . Anxiety   . Arthritis   . Asthma   . Atrial flutter ([place])   . Cholelithiases 09/26/2015  . Diverticulosis   . Encounter for antineoplastic immunotherapy 12/09/2016  . GERD (gastroesophageal reflux disease)   . H/O hiatal hernia   . History of blood transfusion   . History of chemotherapy   . History of kidney stones   . History of radiation therapy   . Internal hemorrhoid   . Lung cancer ([place])    a. Stage IB non-small cell carcinoma, s/p R VATS, wedge resection, RU lobectomy 10/2012.  . Paroxysmal atrial fibrillation ([facility])    a. anticoagulated with Xarelto  . Pneumonia 06/2016   [facility]  . Presence of permanent cardiac pacemaker   . Pulmonary nodule   . Tachycardia-bradycardia syndrome ([place])    a. 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   Past Surgical History:  Procedure Laterality Date  . CARDIOVERSION N/A 02/19/2018   Procedure: CARDIOVERSION;  Surgeon: Sanda Klein, MD;  Location: [facility] ENDOSCOPY;  Service: Cardiovascular;  Laterality: N/A;  . CARDIOVERSION N/A 04/30/2018   Procedure: CARDIOVERSION;  Surgeon: Thayer Headings, MD;  Location: [facility] ENDOSCOPY;  Service: Cardiovascular;  Laterality: N/A;  . CATARACT EXTRACTION W/PHACO Right 08/14/2016   Procedure: CATARACT EXTRACTION PHACO AND INTRAOCULAR LENS PLACEMENT RIGHT EYE CDE 10.53;  Surgeon: Tonny Branch, MD;  Location:  [facility] ORS;  Service: Ophthalmology;  Laterality: Right;  right  . CATARACT EXTRACTION W/PHACO Left 09/25/2016   Procedure: CATARACT EXTRACTION PHACO AND INTRAOCULAR LENS PLACEMENT (IOC);  Surgeon: Tonny Branch, MD;  Location: [facility] ORS;  Service: Ophthalmology;  Laterality: Left;  left CDE 8.81  . COLONOSCOPY     [facility]: cattered sigmoid diverticula, internal hemorrhoids, sessile polyp in ascending and mid transverse colon (tubular adenoma).   . colonoscopy with polypectomy  2015  . ESOPHAGOGASTRODUODENOSCOPY     [facility]: bile reflux in stomach, mild gastritis, hiatal hernia  . ESOPHAGOGASTRODUODENOSCOPY N/A 01/31/2016   Procedure: ESOPHAGOGASTRODUODENOSCOPY (EGD);  Surgeon: [name], MD;  [facility]: [facility] ENDO SUITE;  Service: Endoscopy;  Laterality: N/A;  1200  . IR FLUORO GUIDE PORT INSERTION RIGHT  06/30/2017  . IR US GUIDE VASC ACCESS RIGHT  06/30/2017  . LOBECTOMY Right 10/27/2012   Procedure: LOBECTOMY;  Surgeon: [name], MD;  Location: [facility];  Service: Thoracic;  Laterality: Right;   RIGHT UPPER LOBECTOMY & Node Dissection  . LYMPH NODE BIOPSY Right 05/01/2014   Procedure: LYMPH NODE BIOPSY, Right Axillary;  Surgeon: [name], MD;  Location: [facility];  Service: Thoracic;  Laterality: Right;  . PACEMAKER IMPLANT N/A 10/30/2017   SJM Assurity MRI PPM implanted by Dr [name] once leadless pacemaker battery depleted  . PARTIAL HYSTERECTOMY    . PERMANENT PACEMAKER INSERTION N/A 09/14/2012   Nanostim (SJM) leadless pacemaker (LEADLESS II STUDY PATIENT) implanted by Dr [name], no longer functioning, device abandoned.  [name]  VIDEO ASSISTED THORACOSCOPY (VATS)/WEDGE RESECTION Right 10/27/2012   Procedure: VIDEO ASSISTED THORACOSCOPY (VATS),RGHT UPPER LOBE LUNG WEDGE RESECTION;  Surgeon: [name], MD;  Location: [facility];  Service: Thoracic;  Laterality: Right;    ROS- all systems are reviewed and negative except as per HPI above  Current Outpatient Medications  Medication  Sig Dispense Refill  . acetaminophen (TYLENOL) 500 MG tablet Take 500 mg by mouth 3 (three) times daily as needed for moderate pain or fever.     [name] albuterol (PROVENTIL HFA;VENTOLIN HFA) 108 (90 BASE) MCG/ACT inhaler Inhale 1-2 puffs into the lungs every 6 (six) hours as needed for wheezing or shortness of breath.     . calcium carbonate (TUMS - DOSED IN MG ELEMENTAL CALCIUM) 500 MG chewable tablet Chew 1 tablet by mouth as needed for indigestion or heartburn.    [name] CARTIA XT 240 MG 24 hr capsule TAKE 1 CAPSULE(240 MG) BY MOUTH DAILY (Patient taking differently: Take 240 mg by mouth daily. ) 90 capsule 3  . dimenhyDRINATE (DRAMAMINE) 50 MG tablet Take 12.5 mg by mouth every 8 (eight) hours as needed for dizziness.    . flecainide (TAMBOCOR) 100 MG tablet Take 1 tablet (100 mg total) by mouth 2 (two) times daily. 180 tablet 2  . ipratropium-albuterol (DUONEB) 0.5-2.5 (3) MG/3ML SOLN Take 3 mLs by nebulization every 6 (six) hours as needed (shortness of breath or wheezing).     [name] lidocaine-prilocaine (EMLA) cream Apply 1 application topically as needed. (Patient taking differently: Apply 1 application topically as needed (for port). ) 30 g 4  . loratadine (CLARITIN) 10 MG tablet Take 10 mg by mouth daily.    . Polyvinyl Alcohol-Povidone (REFRESH OP) Place 1 drop into both eyes daily as needed (dry eyes).    [name] tablet TAKE 1 TABLET(20 MG) BY MOUTH DAILY WITH SUPPER (Patient taking differently: Take 20 mg by mouth daily with supper. ) 30 tablet 5   No current facility-administered medications for this visit.     Physical Exam: Vitals:   05/07/18 1021  BP: (!) 150/82  Pulse: 83  Weight: 152 lb (68.9 kg)  Height: 5\' 2"  (1.575 m)    GEN- The patient is well appearing, alert and oriented x 3 today.   Head- normocephalic, atraumatic Eyes-  Sclera clear, conjunctiva pink Ears- hearing intact Oropharynx- clear Lungs- Clear to ausculation bilaterally, normal work of  breathing Chest- pacemaker pocket is well healed Heart- irregular rate and rhythm, no murmurs, rubs or gallops, PMI not laterally displaced GI- soft, NT, ND, + BS Extremities- no clubbing, cyanosis, or edema  Pacemaker interrogation- reviewed in detail today,  See PACEART report  ekg tracing ordered today is personally reviewed and shows afib, demand V pacing, QTc 440 msec  Assessment and Plan:  1. Symptomatic sinus bradycardia  Normal pacemaker function See Pace Art report No changes today  2. Persistent afib chads2vasc score is 3.  On xarelto Recently started on flecainide without success She underwent cardioversion 04/30/18. Therapeutic strategies for afib including medicine and ablation were discussed in detail with the patient today. Risk, benefits, and alternatives to EP study and radiofrequency ablation for afib were also discussed in detail today. These risks include but are not limited to stroke, bleeding, vascular damage, tamponade, perforation, damage to the esophagus, lungs, and other structures, pulmonary vein stenosis, worsening renal function, and death. The patient understands these risk and wishes to proceed.  We will therefore proceed with catheter ablation at  the next available time.  Carto, ICE, anesthesia are requested for the procedure.  Will also obtain cardiac CT prior to the procedure to exclude LAA thrombus and further evaluate atrial anatomy.   Merlin  [name] MD, [facility] 05/07/2018 11:14 AM

## 2018-05-09 LAB — CUP PACEART INCLINIC DEVICE CHECK
Battery Remaining Longevity: 128 mo
Battery Voltage: 3.02 V
Date Time Interrogation Session: 20191004144751
Implantable Lead Implant Date: 20190329
Implantable Lead Location: 753859
Implantable Lead Location: 753860
Implantable Pulse Generator Implant Date: 20190329
Lead Channel Impedance Value: 425 Ohm
Lead Channel Pacing Threshold Pulse Width: 0.5 ms
Lead Channel Sensing Intrinsic Amplitude: 2.9 mV
Lead Channel Setting Pacing Amplitude: 2 V
Lead Channel Setting Pacing Amplitude: 2.5 V
Lead Channel Setting Pacing Pulse Width: 0.5 ms
MDC IDC LEAD IMPLANT DT: 20190329
MDC IDC MSMT LEADCHNL RV IMPEDANCE VALUE: 612.5 Ohm
MDC IDC MSMT LEADCHNL RV PACING THRESHOLD AMPLITUDE: 1 V
MDC IDC MSMT LEADCHNL RV SENSING INTR AMPL: 12 mV
MDC IDC SET LEADCHNL RV SENSING SENSITIVITY: 2 mV
MDC IDC STAT BRADY RA PERCENT PACED: 6.2 %
MDC IDC STAT BRADY RV PERCENT PACED: 34 %
Pulse Gen Serial Number: 9003317

## 2018-05-11 ENCOUNTER — Encounter: Payer: Self-pay | Admitting: *Deleted

## 2018-05-11 ENCOUNTER — Inpatient Hospital Stay: Payer: Medicare Other

## 2018-05-11 ENCOUNTER — Encounter: Payer: Self-pay | Admitting: Internal Medicine

## 2018-05-11 ENCOUNTER — Telehealth: Payer: Self-pay | Admitting: Internal Medicine

## 2018-05-11 ENCOUNTER — Inpatient Hospital Stay (HOSPITAL_BASED_OUTPATIENT_CLINIC_OR_DEPARTMENT_OTHER): Payer: Medicare Other | Admitting: Internal Medicine

## 2018-05-11 ENCOUNTER — Inpatient Hospital Stay: Payer: Medicare Other | Attending: Internal Medicine

## 2018-05-11 VITALS — BP 127/75 | HR 71 | Temp 97.8°F | Resp 18 | Ht 62.0 in | Wt 150.2 lb

## 2018-05-11 DIAGNOSIS — Z79899 Other long term (current) drug therapy: Secondary | ICD-10-CM | POA: Insufficient documentation

## 2018-05-11 DIAGNOSIS — Z5112 Encounter for antineoplastic immunotherapy: Secondary | ICD-10-CM | POA: Diagnosis present

## 2018-05-11 DIAGNOSIS — C3411 Malignant neoplasm of upper lobe, right bronchus or lung: Secondary | ICD-10-CM | POA: Diagnosis present

## 2018-05-11 DIAGNOSIS — R531 Weakness: Secondary | ICD-10-CM

## 2018-05-11 DIAGNOSIS — C3491 Malignant neoplasm of unspecified part of right bronchus or lung: Secondary | ICD-10-CM

## 2018-05-11 DIAGNOSIS — C773 Secondary and unspecified malignant neoplasm of axilla and upper limb lymph nodes: Secondary | ICD-10-CM | POA: Insufficient documentation

## 2018-05-11 DIAGNOSIS — Z95828 Presence of other vascular implants and grafts: Secondary | ICD-10-CM

## 2018-05-11 DIAGNOSIS — I480 Paroxysmal atrial fibrillation: Secondary | ICD-10-CM

## 2018-05-11 DIAGNOSIS — C3490 Malignant neoplasm of unspecified part of unspecified bronchus or lung: Secondary | ICD-10-CM

## 2018-05-11 DIAGNOSIS — Z923 Personal history of irradiation: Secondary | ICD-10-CM

## 2018-05-11 LAB — CMP (CANCER CENTER ONLY)
ALBUMIN: 3.9 g/dL (ref 3.5–5.0)
ALT: 34 U/L (ref 0–44)
ANION GAP: 8 (ref 5–15)
AST: 36 U/L (ref 15–41)
Alkaline Phosphatase: 112 U/L (ref 38–126)
BILIRUBIN TOTAL: 1.3 mg/dL — AB (ref 0.3–1.2)
BUN: 11 mg/dL (ref 8–23)
CHLORIDE: 104 mmol/L (ref 98–111)
CO2: 28 mmol/L (ref 22–32)
Calcium: 9.7 mg/dL (ref 8.9–10.3)
Creatinine: 0.73 mg/dL (ref 0.44–1.00)
GFR, Est AFR Am: >60 mL/min (ref >60)
GFR, Estimated: >60 mL/min (ref >60)
GLUCOSE: 108 mg/dL — AB (ref 70–99)
POTASSIUM: 4.5 mmol/L (ref 3.5–5.1)
SODIUM: 140 mmol/L (ref 135–145)
Total Protein: 7 g/dL (ref 6.5–8.1)

## 2018-05-11 LAB — CBC WITH DIFFERENTIAL (CANCER CENTER ONLY)
ABS IMMATURE GRANULOCYTES: 0.02 10*3/uL (ref 0.00–0.07)
Basophils Absolute: 0 10*3/uL (ref 0.0–0.1)
Basophils Relative: 1 %
Eosinophils Absolute: 0.2 10*3/uL (ref 0.0–0.5)
Eosinophils Relative: 3 %
HCT: 38.5 % (ref 36.0–46.0)
Hemoglobin: 13.6 g/dL (ref 12.0–15.0)
IMMATURE GRANULOCYTES: 0 %
LYMPHS ABS: 1.1 10*3/uL (ref 0.7–4.0)
Lymphocytes Relative: 19 %
MCH: 33.5 pg (ref 26.0–34.0)
MCHC: 35.3 g/dL (ref 30.0–36.0)
MCV: 94.8 fL (ref 80.0–100.0)
MONO ABS: 0.5 10*3/uL (ref 0.1–1.0)
MONOS PCT: 8 %
NEUTROS ABS: 4.1 10*3/uL (ref 1.7–7.7)
NEUTROS PCT: 69 %
PLATELETS: 188 10*3/uL (ref 150–400)
RBC: 4.06 MIL/uL (ref 3.87–5.11)
RDW: 13.3 % (ref 11.5–15.5)
WBC Count: 5.9 10*3/uL (ref 4.0–10.5)
nRBC: 0 % (ref 0.0–0.2)

## 2018-05-11 LAB — TSH: TSH: 1.889 u[IU]/mL (ref 0.308–3.960)

## 2018-05-11 MED ORDER — SODIUM CHLORIDE 0.9 % IV SOLN
Freq: Once | INTRAVENOUS | Status: AC
  Administered 2018-05-11: 12:00:00 via INTRAVENOUS
  Filled 2018-05-11: qty 250

## 2018-05-11 MED ORDER — SODIUM CHLORIDE 0.9 % IV SOLN
480.0000 mg | Freq: Once | INTRAVENOUS | Status: AC
  Administered 2018-05-11: 480 mg via INTRAVENOUS
  Filled 2018-05-11: qty 48

## 2018-05-11 MED ORDER — HEPARIN SOD (PORK) LOCK FLUSH 100 UNIT/ML IV SOLN
500.0000 [IU] | Freq: Once | INTRAVENOUS | Status: AC | PRN
  Administered 2018-05-11: 500 [IU]
  Filled 2018-05-11: qty 5

## 2018-05-11 MED ORDER — SODIUM CHLORIDE 0.9% FLUSH
10.0000 mL | INTRAVENOUS | Status: DC | PRN
  Administered 2018-05-11: 10 mL
  Filled 2018-05-11: qty 10

## 2018-05-11 MED ORDER — SODIUM CHLORIDE 0.9% FLUSH
10.0000 mL | INTRAVENOUS | Status: DC | PRN
  Administered 2018-05-11: 10 mL via INTRAVENOUS
  Filled 2018-05-11: qty 10

## 2018-05-11 NOTE — Progress Notes (Signed)
Oncology Nurse Navigator Documentation  Oncology Nurse Navigator Flowsheets 05/11/2018  Navigator Location [facility]  Navigator Encounter Type Clinic/MDC  Patient Visit Type MedOnc/I spoke with patient at clinic today.  I help to explain treatment plan and schedule.    Treatment Phase Treatment  Barriers/Navigation Needs Education  Education Other  Interventions Education  Acuity Level 1  Acuity Level 1 Minimal follow up required  Time Spent with Patient 15

## 2018-05-11 NOTE — Telephone Encounter (Signed)
Scheduled appt per 10/8 los - pt to get an updated schedule next visit.   

## 2018-05-11 NOTE — Progress Notes (Signed)
[place] [phone]   [phone]  OFFICE PROGRESS NOTE  Harris, [name], [name] [address] [place] [address]  DIAGNOSIS: Metastatic non-small cell lung cancer, adenocarcinoma. This was initially diagnosed as stage IB (T2a, N0, M0) in March of 2014, status post right upper lobectomy with lymph node dissection but the patient has evidence for disease recurrence in the right axilla status post resection.  PRIOR THERAPY:  1) Status post right axillary lymph node biopsy on 05/01/2014 under the care of Dr. Roxan Hockey. 2) Systemic chemotherapy with carboplatin for AUC of 5 and Alimta 500 mg/M2 every 3 weeks. First cycle 05/31/2014. Status post 6 cycles. Last dose was given 09/22/2014. 3) palliative radiotherapy to the subcarinal lymph node under the care of Dr. Sondra Come. 4) Nivolumab 240 mg IV every 2 weeks. First dose 12/23/2016. Status post 6 cycles.  CURRENT THERAPY:  Nivolumab 480 mg IV every 4 weeks. First dose 03/17/2017. Status post 15 cycles.  INTERVAL HISTORY: Nancy Hodges 72 y.o. female returns to the clinic today for follow-up visit.  The patient is feeling fine today with no specific complaints.  She denied having any chest pain, shortness of breath, cough or hemoptysis.  She denied having any fever or chills.  She has no nausea, vomiting, diarrhea or constipation.  She has no recent weight loss or night sweats.  She has no headache or visual changes but continues to have some sinus issues and she missed her appointment with ENT.  The patient continues to tolerate her immunotherapy fairly well.  She had repeat CT scan of the chest, abdomen and pelvis performed recently.  She is here for evaluation and discussion of her risk her results.  MEDICAL HISTORY: Past Medical History:  Diagnosis Date  . Allergic rhinitis   . Anxiety   . Arthritis   . Asthma   . Atrial flutter ([place])   . Cholelithiases 09/26/2015  . Diverticulosis     . Encounter for antineoplastic immunotherapy 12/09/2016  . GERD (gastroesophageal reflux disease)   . H/O hiatal hernia   . History of blood transfusion   . History of chemotherapy   . History of kidney stones   . History of radiation therapy   . Internal hemorrhoid   . Lung cancer ([place])    a. Stage IB non-small cell carcinoma, s/p R VATS, wedge resection, RU lobectomy 10/2012.  . Paroxysmal atrial fibrillation ([facility])    a. anticoagulated with Xarelto  . Pneumonia 06/2016   [facility]  . Presence of permanent cardiac pacemaker   . Pulmonary nodule   . Tachycardia-bradycardia syndrome ([place])    a. 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    ALLERGIES:  is allergic to chlorpheniramine-dm; prednisone; septra [sulfamethoxazole-trimethoprim]; penicillins; and vioxx [rofecoxib].  MEDICATIONS:  Current Outpatient Medications  Medication Sig Dispense Refill  . acetaminophen (TYLENOL) 500 MG tablet Take 500 mg by mouth 3 (three) times daily as needed for moderate pain or fever.     [name] albuterol (PROVENTIL HFA;VENTOLIN HFA) 108 (90 BASE) MCG/ACT inhaler Inhale 1-2 puffs into the lungs every 6 (six) hours as needed for wheezing or shortness of breath.     . calcium carbonate (TUMS - DOSED IN MG ELEMENTAL CALCIUM) 500 MG chewable tablet Chew 1 tablet by mouth as needed for indigestion or heartburn.    [name] CARTIA XT 240 MG 24 hr capsule TAKE 1 CAPSULE(240 MG) BY MOUTH DAILY (Patient taking differently: Take 240 mg by mouth  daily. ) 90 capsule 3  . dimenhyDRINATE (DRAMAMINE) 50 MG tablet Take 12.5 mg by mouth every 8 (eight) hours as needed for dizziness.    [name] ipratropium-albuterol (DUONEB) 0.5-2.5 (3) MG/3ML SOLN Take 3 mLs by nebulization every 6 (six) hours as needed (shortness of breath or wheezing).     [name] lidocaine-prilocaine (EMLA) cream Apply 1 application topically as needed. (Patient taking differently: Apply 1 application topically as needed (for port). ) 30 g 4  . loratadine (CLARITIN)  10 MG tablet Take 10 mg by mouth daily.    . Polyvinyl Alcohol-Povidone (REFRESH OP) Place 1 drop into both eyes daily as needed (dry eyes).    [name] tablet TAKE 1 TABLET(20 MG) BY MOUTH DAILY WITH SUPPER (Patient taking differently: Take 20 mg by mouth daily with supper. ) 30 tablet 5   No current facility-administered medications for this visit.     SURGICAL HISTORY:  Past Surgical History:  Procedure Laterality Date  . CARDIOVERSION N/A 02/19/2018   Procedure: CARDIOVERSION;  Surgeon: Sanda Klein, MD;  Location: [facility];  Service: Cardiovascular;  Laterality: N/A;  . CARDIOVERSION N/A 04/30/2018   Procedure: CARDIOVERSION;  Surgeon: Thayer Headings, MD;  Location: [facility] ENDOSCOPY;  Service: Cardiovascular;  Laterality: N/A;  . CATARACT EXTRACTION W/PHACO Right 08/14/2016   Procedure: CATARACT EXTRACTION PHACO AND INTRAOCULAR LENS PLACEMENT RIGHT EYE CDE 10.53;  Surgeon: Tonny Branch, MD;  Location: [facility];  Service: Ophthalmology;  Laterality: Right;  right  . CATARACT EXTRACTION W/PHACO Left 09/25/2016   Procedure: CATARACT EXTRACTION PHACO AND INTRAOCULAR LENS PLACEMENT (IOC);  Surgeon: Tonny Branch, MD;  Location: [facility];  Service: Ophthalmology;  Laterality: Left;  left CDE 8.81  . COLONOSCOPY     [facility]: cattered sigmoid diverticula, internal hemorrhoids, sessile polyp in ascending and mid transverse colon (tubular adenoma).   . colonoscopy with polypectomy  2015  . ESOPHAGOGASTRODUODENOSCOPY     [facility]: bile reflux in stomach, mild gastritis, hiatal hernia  . ESOPHAGOGASTRODUODENOSCOPY N/A 01/31/2016   Procedure: ESOPHAGOGASTRODUODENOSCOPY (EGD);  Surgeon: [name], MD;  Location: [facility];  Service: Endoscopy;  Laterality: N/A;  1200  . IR FLUORO GUIDE PORT INSERTION RIGHT  06/30/2017  . IR US GUIDE VASC ACCESS RIGHT  06/30/2017  . LOBECTOMY Right 10/27/2012   Procedure: LOBECTOMY;  Surgeon: [name], MD;  Location: [facility];  Service:  Thoracic;  Laterality: Right;   RIGHT UPPER LOBECTOMY & Node Dissection  . LYMPH NODE BIOPSY Right 05/01/2014   Procedure: LYMPH NODE BIOPSY, Right Axillary;  Surgeon: [name], MD;  Location: [facility];  Service: Thoracic;  Laterality: Right;  . PACEMAKER IMPLANT N/A 10/30/2017   SJM Assurity MRI PPM implanted by Dr [name] once leadless pacemaker battery depleted  . PARTIAL HYSTERECTOMY    . PERMANENT PACEMAKER INSERTION N/A 09/14/2012   Nanostim (SJM) leadless pacemaker (LEADLESS II STUDY PATIENT) implanted by Dr [name], no longer functioning, device abandoned.  [name] VIDEO ASSISTED THORACOSCOPY (VATS)/WEDGE RESECTION Right 10/27/2012   Procedure: VIDEO ASSISTED THORACOSCOPY (VATS),RGHT UPPER LOBE LUNG WEDGE RESECTION;  Surgeon: [name], MD;  Location: [facility];  Service: Thoracic;  Laterality: Right;    REVIEW OF SYSTEMS:  Constitutional: positive for fatigue Eyes: negative Ears, nose, mouth, throat, and face: negative Respiratory: negative Cardiovascular: negative Gastrointestinal: negative Genitourinary:negative Integument/breast: negative Hematologic/lymphatic: negative Musculoskeletal:negative Neurological: negative Behavioral/Psych: negative Endocrine: negative Allergic/Immunologic: negative   PHYSICAL EXAMINATION: General appearance: alert, cooperative, fatigued and no distress Head: Normocephalic, without obvious  abnormality, atraumatic Neck: no adenopathy, no JVD, supple, symmetrical, trachea midline and thyroid not enlarged, symmetric, no tenderness/mass/nodules Lymph nodes: Cervical, supraclavicular, and axillary nodes normal. Resp: clear to auscultation bilaterally Back: symmetric, no curvature. ROM normal. No CVA tenderness. Cardio: regular rate and rhythm, S1, S2 normal, no murmur, click, rub or gallop GI: soft, non-tender; bowel sounds normal; no masses,  no organomegaly Extremities: extremities normal, atraumatic, no cyanosis or edema Neurologic: Alert  and oriented X 3, normal strength and tone. Normal symmetric reflexes. Normal coordination and gait  ECOG PERFORMANCE STATUS: 1 - Symptomatic but completely ambulatory  Blood pressure 127/75, pulse 71, temperature 97.8 F (36.6 C), temperature source Oral, resp. rate 18, height 5\' 2"  (1.575 m), weight 150 lb 3.2 oz (68.1 kg), SpO2 96 %.  LABORATORY DATA: Lab Results  Component Value Date   WBC 5.9 05/11/2018   HGB 13.6 05/11/2018   HCT 38.5 05/11/2018   MCV 94.8 05/11/2018   PLT 188 05/11/2018      Chemistry      Component Value Date/Time   NA 140 05/11/2018 1001   NA 139 08/05/2017 0902   K 4.5 05/11/2018 1001   K 4.1 08/05/2017 0902   CL 104 05/11/2018 1001   CO2 28 05/11/2018 1001   CO2 28 08/05/2017 0902   BUN 11 05/11/2018 1001   BUN 10.1 08/05/2017 0902   CREATININE 0.73 05/11/2018 1001   CREATININE 0.8 08/05/2017 0902   GLU 134 (H) 02/18/2017 1410      Component Value Date/Time   CALCIUM 9.7 05/11/2018 1001   CALCIUM 9.5 08/05/2017 0902   ALKPHOS 112 05/11/2018 1001   ALKPHOS 111 08/05/2017 0902   AST 36 05/11/2018 1001   AST 33 08/05/2017 0902   ALT 34 05/11/2018 1001   ALT 34 08/05/2017 0902   BILITOT 1.3 (H) 05/11/2018 1001   BILITOT 1.39 (H) 08/05/2017 0902       RADIOGRAPHIC STUDIES: Ct Chest W Contrast  Result Date: 05/07/2018 CLINICAL DATA:  Patient with history of lung cancer diagnosed in 2014 status post chemotherapy and surgical treatment. EXAM: CT CHEST, ABDOMEN, AND PELVIS WITH CONTRAST TECHNIQUE: Multidetector CT imaging of the chest, abdomen and pelvis was performed following the standard protocol during bolus administration of intravenous contrast. CONTRAST:  137mL ISOVUE-300 IOPAMIDOL (ISOVUE-300) INJECTION 61% COMPARISON:  CT CAP 02/15/2018. FINDINGS: CT CHEST FINDINGS Cardiovascular: Right anterior chest wall Port-A-Cath is present with tip terminating in the superior vena cava. Multi lead pacer apparatus within the left anterior chest wall.  Normal heart size. Thoracic aortic vascular calcifications. Coronary arterial vascular calcifications. Small amount of pericardial fluid. Mediastinum/Nodes: Small hiatal hernia. No enlarged axillary, mediastinal or hilar lymphadenopathy. Stable 7 mm right supraclavicular lymph node (image 6; series 2). Lungs/Pleura: Patient status post right upper lobectomy. Re demonstrated scattered subpleural scarring right lower lung. Similar to mildly improved peripheral nodularity within the lingula (image 50 3-55). Unchanged left apical ground-glass nodule measuring 7 x 5 mm (image 23; series 4). Centrilobular and paraseptal emphysematous change. No pleural effusion or pneumothorax. Musculoskeletal: Thoracic spine degenerative changes. No aggressive or acute appearing osseous lesions. CT ABDOMEN PELVIS FINDINGS Hepatobiliary: Stable 8 mm hypervascular lesion hepatic dome (image 43; series 2). Cirrhotic morphology of the liver. Multiple stones within the gallbladder lumen. Recanalized paraumbilical vein. No intrahepatic or extrahepatic biliary ductal dilatation. Pancreas: Unremarkable Spleen: Splenomegaly measuring 14 cm. Stable 1.3 cm low-attenuation lesion in the spleen (image 64; series 2). Adrenals/Urinary Tract: Normal adrenal glands. Kidneys enhance symmetrically with contrast. Bilateral  renal cortical scarring. No hydronephrosis. Urinary bladder is unremarkable. Stomach/Bowel: Descending and sigmoid colonic diverticulosis. No CT evidence for acute diverticulitis. Oral contrast material throughout the small large bowel. Normal appendix. No free fluid or free intraperitoneal air. Small hiatal hernia. Normal morphology of the stomach. Vascular/Lymphatic: Normal caliber abdominal aorta. Atherosclerotic narrowing of the superior mesenteric artery. No retroperitoneal lymphadenopathy. Stable 10 mm upper abdominal node (image 59; series 2). Reproductive: Status post hysterectomy. Adnexal structures unremarkable. Other: None.  Musculoskeletal: Lumbar spine degenerative changes. No aggressive or acute appearing osseous lesions. IMPRESSION: 1. No findings to suggest localized recurrence or metastatic disease. 2. Similar to mildly improved nodularity within the lingula and left upper lobe, likely inflammatory in etiology. Recommend attention on follow-up. 3. Stable small right supraclavicular lymph node, recommend attention on follow-up. Similar appearing upper abdominal lymph nodes. 4. Morphologic changes to the liver compatible with cirrhosis. Unchanged hypervascular lesion within the hepatic dome. Recommend attention on follow-up. 5. Aortic Atherosclerosis (ICD10-I70.0) and Emphysema (ICD10-J43.9). Electronically Signed   By: Lovey Newcomer M.D.   On: 05/07/2018 11:09   Ct Abdomen Pelvis W Contrast  Result Date: 05/07/2018 CLINICAL DATA:  Patient with history of lung cancer diagnosed in 2014 status post chemotherapy and surgical treatment. EXAM: CT CHEST, ABDOMEN, AND PELVIS WITH CONTRAST TECHNIQUE: Multidetector CT imaging of the chest, abdomen and pelvis was performed following the standard protocol during bolus administration of intravenous contrast. CONTRAST:  168mL ISOVUE-300 IOPAMIDOL (ISOVUE-300) INJECTION 61% COMPARISON:  CT CAP 02/15/2018. FINDINGS: CT CHEST FINDINGS Cardiovascular: Right anterior chest wall Port-A-Cath is present with tip terminating in the superior vena cava. Multi lead pacer apparatus within the left anterior chest wall. Normal heart size. Thoracic aortic vascular calcifications. Coronary arterial vascular calcifications. Small amount of pericardial fluid. Mediastinum/Nodes: Small hiatal hernia. No enlarged axillary, mediastinal or hilar lymphadenopathy. Stable 7 mm right supraclavicular lymph node (image 6; series 2). Lungs/Pleura: Patient status post right upper lobectomy. Re demonstrated scattered subpleural scarring right lower lung. Similar to mildly improved peripheral nodularity within the lingula  (image 50 3-55). Unchanged left apical ground-glass nodule measuring 7 x 5 mm (image 23; series 4). Centrilobular and paraseptal emphysematous change. No pleural effusion or pneumothorax. Musculoskeletal: Thoracic spine degenerative changes. No aggressive or acute appearing osseous lesions. CT ABDOMEN PELVIS FINDINGS Hepatobiliary: Stable 8 mm hypervascular lesion hepatic dome (image 43; series 2). Cirrhotic morphology of the liver. Multiple stones within the gallbladder lumen. Recanalized paraumbilical vein. No intrahepatic or extrahepatic biliary ductal dilatation. Pancreas: Unremarkable Spleen: Splenomegaly measuring 14 cm. Stable 1.3 cm low-attenuation lesion in the spleen (image 64; series 2). Adrenals/Urinary Tract: Normal adrenal glands. Kidneys enhance symmetrically with contrast. Bilateral renal cortical scarring. No hydronephrosis. Urinary bladder is unremarkable. Stomach/Bowel: Descending and sigmoid colonic diverticulosis. No CT evidence for acute diverticulitis. Oral contrast material throughout the small large bowel. Normal appendix. No free fluid or free intraperitoneal air. Small hiatal hernia. Normal morphology of the stomach. Vascular/Lymphatic: Normal caliber abdominal aorta. Atherosclerotic narrowing of the superior mesenteric artery. No retroperitoneal lymphadenopathy. Stable 10 mm upper abdominal node (image 59; series 2). Reproductive: Status post hysterectomy. Adnexal structures unremarkable. Other: None. Musculoskeletal: Lumbar spine degenerative changes. No aggressive or acute appearing osseous lesions. IMPRESSION: 1. No findings to suggest localized recurrence or metastatic disease. 2. Similar to mildly improved nodularity within the lingula and left upper lobe, likely inflammatory in etiology. Recommend attention on follow-up. 3. Stable small right supraclavicular lymph node, recommend attention on follow-up. Similar appearing upper abdominal lymph nodes. 4. Morphologic changes to the  liver compatible with cirrhosis. Unchanged hypervascular lesion within the hepatic dome. Recommend attention on follow-up. 5. Aortic Atherosclerosis (ICD10-I70.0) and Emphysema (ICD10-J43.9). Electronically Signed   By: Lovey Newcomer M.D.   On: 05/07/2018 11:09   ASSESSMENT AND PLAN:  This is a very pleasant 72 years old white female with metastatic non-small cell lung cancer, adenocarcinoma with no actionable mutations status post systemic chemotherapy with carboplatin and Alimta and she is currently undergoing treatment with second line immunotherapy with Nivolumab status post 6 cycles. This was followed by immunotherapy with Nivolumab 480 mg IV every 4 weeks status post 15 cycles. The patient has been tolerating this treatment well with no concerning adverse effects. She had a repeat CT scan of the chest, abdomen and pelvis performed recently.  I personally and independently reviewed the scans and discussed the results with the patient today. Her scan showed no concerning findings for disease progression. I recommended for the patient to continue her current treatment with nivolumab every 4 weeks.  She will proceed with cycle #16 today. She will come back for follow-up visit in 4 weeks for evaluation before the next cycle of her treatment. The patient was advised to call immediately if she has any concerning symptoms in the interval. The patient voices understanding of current disease status and treatment options and is in agreement with the current care plan. All questions were answered. The patient knows to call the clinic with any problems, questions or concerns. We can certainly see the patient much sooner if necessary.  Disclaimer: This note was dictated with voice recognition software. Similar sounding words can inadvertently be transcribed and may not be corrected upon review.

## 2018-05-11 NOTE — Patient Instructions (Signed)
[facility] Discharge Instructions for Patients Receiving Chemotherapy  Today you received the following chemotherapy agents :  Opdivo.  To help prevent nausea and vomiting after your treatment, we encourage you to take your nausea medication as prescribed.   If you develop nausea and vomiting that is not controlled by your nausea medication, call the clinic.   BELOW ARE SYMPTOMS THAT SHOULD BE REPORTED IMMEDIATELY:  *FEVER GREATER THAN 100.5 F  *CHILLS WITH OR WITHOUT FEVER  NAUSEA AND VOMITING THAT IS NOT CONTROLLED WITH YOUR NAUSEA MEDICATION  *UNUSUAL SHORTNESS OF BREATH  *UNUSUAL BRUISING OR BLEEDING  TENDERNESS IN MOUTH AND THROAT WITH OR WITHOUT PRESENCE OF ULCERS  *URINARY PROBLEMS  *BOWEL PROBLEMS  UNUSUAL RASH Items with * indicate a potential emergency and should be followed up as soon as possible.  Feel free to call the clinic should you have any questions or concerns. The clinic phone number is [phone].  Please show the CHEMO ALERT CARD at check-in to the Emergency Department and triage nurse.   

## 2018-05-12 ENCOUNTER — Telehealth: Payer: Self-pay | Admitting: Internal Medicine

## 2018-05-12 NOTE — Telephone Encounter (Signed)
New Message   Pt's states that her heart has been in afib for a while but now it is in rhythm and she wanted to let the nurse know that her heart is back in rhythm. Please call

## 2018-05-13 NOTE — Telephone Encounter (Signed)
Call placed to Pt.  Discussed afib and ablation.  Gave dates for ablation in November.  Pt will discuss with daughter and call this nurse back to schedule ablation.  No further action needed at this time.

## 2018-05-27 LAB — CUP PACEART REMOTE DEVICE CHECK
Battery Remaining Percentage: 95.5 %
Brady Statistic AP VP Percent: 27 %
Brady Statistic AP VS Percent: 39 %
Brady Statistic RA Percent Paced: 6.5 %
Brady Statistic RV Percent Paced: 33 %
Date Time Interrogation Session: 20190930060030
Implantable Lead Implant Date: 20190329
Implantable Lead Implant Date: 20190329
Implantable Lead Location: 753859
Implantable Lead Location: 753860
Implantable Pulse Generator Implant Date: 20190329
Lead Channel Impedance Value: 410 Ohm
Lead Channel Impedance Value: 600 Ohm
Lead Channel Pacing Threshold Amplitude: 1 V
Lead Channel Pacing Threshold Pulse Width: 0.5 ms
Lead Channel Sensing Intrinsic Amplitude: 1.7 mV
Lead Channel Sensing Intrinsic Amplitude: 12 mV
Lead Channel Setting Pacing Amplitude: 2 V
Lead Channel Setting Pacing Pulse Width: 0.5 ms
MDC IDC MSMT BATTERY REMAINING LONGEVITY: 116 mo
MDC IDC MSMT BATTERY VOLTAGE: 3.02 V
MDC IDC MSMT LEADCHNL RA PACING THRESHOLD AMPLITUDE: 1 V
MDC IDC MSMT LEADCHNL RV PACING THRESHOLD PULSEWIDTH: 0.5 ms
MDC IDC SET LEADCHNL RV PACING AMPLITUDE: 2.5 V
MDC IDC SET LEADCHNL RV SENSING SENSITIVITY: 2 mV
MDC IDC STAT BRADY AS VP PERCENT: 3.5 %
MDC IDC STAT BRADY AS VS PERCENT: 30 %
Pulse Gen Model: 2272
Pulse Gen Serial Number: 9003317

## 2018-06-08 ENCOUNTER — Encounter: Payer: Self-pay | Admitting: Internal Medicine

## 2018-06-08 ENCOUNTER — Inpatient Hospital Stay: Payer: Medicare Other | Attending: Internal Medicine

## 2018-06-08 ENCOUNTER — Inpatient Hospital Stay: Payer: Medicare Other

## 2018-06-08 ENCOUNTER — Inpatient Hospital Stay (HOSPITAL_BASED_OUTPATIENT_CLINIC_OR_DEPARTMENT_OTHER): Payer: Medicare Other | Admitting: Internal Medicine

## 2018-06-08 ENCOUNTER — Telehealth: Payer: Self-pay | Admitting: Internal Medicine

## 2018-06-08 VITALS — BP 141/65 | HR 74 | Temp 97.9°F | Resp 18 | Ht 62.0 in | Wt 151.4 lb

## 2018-06-08 DIAGNOSIS — Z95828 Presence of other vascular implants and grafts: Secondary | ICD-10-CM

## 2018-06-08 DIAGNOSIS — C3491 Malignant neoplasm of unspecified part of right bronchus or lung: Secondary | ICD-10-CM | POA: Diagnosis not present

## 2018-06-08 DIAGNOSIS — Z5112 Encounter for antineoplastic immunotherapy: Secondary | ICD-10-CM

## 2018-06-08 DIAGNOSIS — Z79899 Other long term (current) drug therapy: Secondary | ICD-10-CM | POA: Diagnosis not present

## 2018-06-08 DIAGNOSIS — C3411 Malignant neoplasm of upper lobe, right bronchus or lung: Secondary | ICD-10-CM | POA: Insufficient documentation

## 2018-06-08 DIAGNOSIS — C773 Secondary and unspecified malignant neoplasm of axilla and upper limb lymph nodes: Secondary | ICD-10-CM | POA: Insufficient documentation

## 2018-06-08 DIAGNOSIS — Z923 Personal history of irradiation: Secondary | ICD-10-CM

## 2018-06-08 DIAGNOSIS — Z9221 Personal history of antineoplastic chemotherapy: Secondary | ICD-10-CM

## 2018-06-08 DIAGNOSIS — I480 Paroxysmal atrial fibrillation: Secondary | ICD-10-CM

## 2018-06-08 LAB — CMP (CANCER CENTER ONLY)
ALT: 30 U/L (ref 0–44)
ANION GAP: 7 (ref 5–15)
AST: 36 U/L (ref 15–41)
Albumin: 3.8 g/dL (ref 3.5–5.0)
Alkaline Phosphatase: 104 U/L (ref 38–126)
BILIRUBIN TOTAL: 1 mg/dL (ref 0.3–1.2)
BUN: 12 mg/dL (ref 8–23)
CHLORIDE: 106 mmol/L (ref 98–111)
CO2: 28 mmol/L (ref 22–32)
Calcium: 10 mg/dL (ref 8.9–10.3)
Creatinine: 0.73 mg/dL (ref 0.44–1.00)
Glucose, Bld: 109 mg/dL — ABNORMAL HIGH (ref 70–99)
POTASSIUM: 4.2 mmol/L (ref 3.5–5.1)
Sodium: 141 mmol/L (ref 135–145)
TOTAL PROTEIN: 7.1 g/dL (ref 6.5–8.1)

## 2018-06-08 LAB — TSH: TSH: 1.615 u[IU]/mL (ref 0.308–3.960)

## 2018-06-08 LAB — CBC WITH DIFFERENTIAL (CANCER CENTER ONLY)
ABS IMMATURE GRANULOCYTES: 0.01 10*3/uL (ref 0.00–0.07)
BASOS ABS: 0 10*3/uL (ref 0.0–0.1)
BASOS PCT: 1 %
Eosinophils Absolute: 0.1 10*3/uL (ref 0.0–0.5)
Eosinophils Relative: 3 %
HCT: 37.8 % (ref 36.0–46.0)
HEMOGLOBIN: 13 g/dL (ref 12.0–15.0)
Immature Granulocytes: 0 %
LYMPHS PCT: 23 %
Lymphs Abs: 1.1 10*3/uL (ref 0.7–4.0)
MCH: 32.3 pg (ref 26.0–34.0)
MCHC: 34.4 g/dL (ref 30.0–36.0)
MCV: 93.8 fL (ref 80.0–100.0)
MONO ABS: 0.5 10*3/uL (ref 0.1–1.0)
Monocytes Relative: 10 %
NEUTROS ABS: 3 10*3/uL (ref 1.7–7.7)
NEUTROS PCT: 63 %
NRBC: 0 % (ref 0.0–0.2)
PLATELETS: 174 10*3/uL (ref 150–400)
RBC: 4.03 MIL/uL (ref 3.87–5.11)
RDW: 13.2 % (ref 11.5–15.5)
WBC: 4.8 10*3/uL (ref 4.0–10.5)

## 2018-06-08 MED ORDER — SODIUM CHLORIDE 0.9 % IV SOLN
480.0000 mg | Freq: Once | INTRAVENOUS | Status: AC
  Administered 2018-06-08: 480 mg via INTRAVENOUS
  Filled 2018-06-08: qty 48

## 2018-06-08 MED ORDER — SODIUM CHLORIDE 0.9 % IV SOLN
Freq: Once | INTRAVENOUS | Status: AC
  Administered 2018-06-08: 11:00:00 via INTRAVENOUS
  Filled 2018-06-08: qty 250

## 2018-06-08 MED ORDER — SODIUM CHLORIDE 0.9% FLUSH
10.0000 mL | INTRAVENOUS | Status: DC | PRN
  Administered 2018-06-08: 10 mL
  Filled 2018-06-08: qty 10

## 2018-06-08 MED ORDER — HEPARIN SOD (PORK) LOCK FLUSH 100 UNIT/ML IV SOLN
500.0000 [IU] | Freq: Once | INTRAVENOUS | Status: AC | PRN
  Administered 2018-06-08: 500 [IU]
  Filled 2018-06-08: qty 5

## 2018-06-08 MED ORDER — SODIUM CHLORIDE 0.9% FLUSH
10.0000 mL | INTRAVENOUS | Status: DC | PRN
  Administered 2018-06-08: 10 mL via INTRAVENOUS
  Filled 2018-06-08: qty 10

## 2018-06-08 NOTE — Progress Notes (Signed)
[place] Telephone:([phone]) [phone]   Fax:([phone]  OFFICE PROGRESS NOTE  Harris, [name], [name] [address] [place] [address]  DIAGNOSIS: Metastatic non-small cell lung cancer, adenocarcinoma. This was initially diagnosed as stage IB (T2a, N0, M0) in March of 2014, status post right upper lobectomy with lymph node dissection but the patient has evidence for disease recurrence in the right axilla status post resection.  PRIOR THERAPY:  1) Status post right axillary lymph node biopsy on 05/01/2014 under the care of Dr. Roxan Hockey. 2) Systemic chemotherapy with carboplatin for AUC of 5 and Alimta 500 mg/M2 every 3 weeks. First cycle 05/31/2014. Status post 6 cycles. Last dose was given 09/22/2014. 3) palliative radiotherapy to the subcarinal lymph node under the care of Dr. Sondra Come. 4) Nivolumab 240 mg IV every 2 weeks. First dose 12/23/2016. Status post 6 cycles.  CURRENT THERAPY:  Nivolumab 480 mg IV every 4 weeks. First dose 03/17/2017. Status post 16 cycles.  INTERVAL HISTORY: Nancy Hodges 72 y.o. female returns to the clinic today for follow-up visit.  The patient is feeling fine today with no concerning complaints.  She denied having any current chest pain, shortness breath, cough or hemoptysis.  She denied having any fever or chills.  She has no nausea, vomiting, diarrhea or constipation.  She continues to tolerate her treatment with Nivolumab fairly well.  The patient is here today for evaluation before starting cycle #17.  MEDICAL HISTORY: Past Medical History:  Diagnosis Date  . Allergic rhinitis   . Anxiety   . Arthritis   . Asthma   . Atrial flutter ([place])   . Cholelithiases 09/26/2015  . Diverticulosis   . Encounter for antineoplastic immunotherapy 12/09/2016  . GERD (gastroesophageal reflux disease)   . H/O hiatal hernia   . History of blood transfusion   . History of chemotherapy   . History of kidney stones   . History of  radiation therapy   . Internal hemorrhoid   . Lung cancer ([place])    a. Stage IB non-small cell carcinoma, s/p R VATS, wedge resection, RU lobectomy 10/2012.  . Paroxysmal atrial fibrillation (H[facility])    a. anticoagulated with Xarelto  . Pneumonia 06/2016   [facility]  . Presence of permanent cardiac pacemaker   . Pulmonary nodule   . Tachycardia-bradycardia syndrome ([place])    a. 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    ALLERGIES:  is allergic to chlorpheniramine-dm; prednisone; septra [sulfamethoxazole-trimethoprim]; penicillins; and vioxx [rofecoxib].  MEDICATIONS:  Current Outpatient Medications  Medication Sig Dispense Refill  . acetaminophen (TYLENOL) 500 MG tablet Take 500 mg by mouth 3 (three) times daily as needed for moderate pain or fever.     [name] albuterol (PROVENTIL HFA;VENTOLIN HFA) 108 (90 BASE) MCG/ACT inhaler Inhale 1-2 puffs into the lungs every 6 (six) hours as needed for wheezing or shortness of breath.     . calcium carbonate (TUMS - DOSED IN MG ELEMENTAL CALCIUM) 500 MG chewable tablet Chew 1 tablet by mouth as needed for indigestion or heartburn.    [name] CARTIA XT 240 MG 24 hr capsule TAKE 1 CAPSULE(240 MG) BY MOUTH DAILY (Patient taking differently: Take 240 mg by mouth daily. ) 90 capsule 3  . dimenhyDRINATE (DRAMAMINE) 50 MG tablet Take 12.5 mg by mouth every 8 (eight) hours as needed for dizziness.    [name] ipratropium-albuterol (DUONEB) 0.5-2.5 (3) MG/3ML SOLN Take 3 mLs by nebulization every 6 (six) hours as needed (shortness  of breath or wheezing).     [name] lidocaine-prilocaine (EMLA) cream Apply 1 application topically as needed. (Patient taking differently: Apply 1 application topically as needed (for port). ) 30 g 4  . loratadine (CLARITIN) 10 MG tablet Take 10 mg by mouth daily.    . Polyvinyl Alcohol-Povidone (REFRESH OP) Place 1 drop into both eyes daily as needed (dry eyes).    [name] tablet TAKE 1 TABLET(20 MG) BY MOUTH DAILY WITH SUPPER (Patient  taking differently: Take 20 mg by mouth daily with supper. ) 30 tablet 5   No current facility-administered medications for this visit.     SURGICAL HISTORY:  Past Surgical History:  Procedure Laterality Date  . CARDIOVERSION N/A 02/19/2018   Procedure: CARDIOVERSION;  Surgeon: Sanda Klein, MD;  Location: [facility] ENDOSCOPY;  Service: Cardiovascular;  Laterality: N/A;  . CARDIOVERSION N/A 04/30/2018   Procedure: CARDIOVERSION;  Surgeon: Thayer Headings, MD;  Location: [facility] ENDOSCOPY;  Service: Cardiovascular;  Laterality: N/A;  . CATARACT EXTRACTION W/PHACO Right 08/14/2016   Procedure: CATARACT EXTRACTION PHACO AND INTRAOCULAR LENS PLACEMENT RIGHT EYE CDE 10.53;  Surgeon: Tonny Branch, MD;  [facility];  Service: Ophthalmology;  Laterality: Right;  right  . CATARACT EXTRACTION W/PHACO Left 09/25/2016   Procedure: CATARACT EXTRACTION PHACO AND INTRAOCULAR LENS PLACEMENT (IOC);  Surgeon: Tonny Branch, MD;  Location: [facility];  Service: Ophthalmology;  Laterality: Left;  left CDE 8.81  . COLONOSCOPY     [facility]: cattered sigmoid diverticula, internal hemorrhoids, sessile polyp in ascending and mid transverse colon (tubular adenoma).   . colonoscopy with polypectomy  2015  . ESOPHAGOGASTRODUODENOSCOPY     [facility]: bile reflux in stomach, mild gastritis, hiatal hernia  . ESOPHAGOGASTRODUODENOSCOPY N/A 01/31/2016   Procedure: ESOPHAGOGASTRODUODENOSCOPY (EGD);  Surgeon: [name], MD;  [facility];  Service: Endoscopy;  Laterality: N/A;  1200  . IR FLUORO GUIDE PORT INSERTION RIGHT  06/30/2017  . IR US GUIDE VASC ACCESS RIGHT  06/30/2017  . LOBECTOMY Right 10/27/2012   Procedure: LOBECTOMY;  Surgeon: [name], MD;  Location: [facility];  Service: Thoracic;  Laterality: Right;   RIGHT UPPER LOBECTOMY & Node Dissection  . LYMPH NODE BIOPSY Right 05/01/2014   Procedure: LYMPH NODE BIOPSY, Right Axillary;  Surgeon: [name], MD;  Location: [facility];  Service: Thoracic;   Laterality: Right;  . PACEMAKER IMPLANT N/A 10/30/2017   SJM Assurity MRI PPM implanted by Dr [name] once leadless pacemaker battery depleted  . PARTIAL HYSTERECTOMY    . PERMANENT PACEMAKER INSERTION N/A 09/14/2012   Nanostim (SJM) leadless pacemaker (LEADLESS II STUDY PATIENT) implanted by Dr [name], no longer functioning, device abandoned.  [name] VIDEO ASSISTED THORACOSCOPY (VATS)/WEDGE RESECTION Right 10/27/2012   Procedure: VIDEO ASSISTED THORACOSCOPY (VATS),RGHT UPPER LOBE LUNG WEDGE RESECTION;  Surgeon: [name], MD;  Location: [facility];  Service: Thoracic;  Laterality: Right;    REVIEW OF SYSTEMS:  A comprehensive review of systems was negative.   PHYSICAL EXAMINATION: General appearance: alert, cooperative and no distress Head: Normocephalic, without obvious abnormality, atraumatic Neck: no adenopathy, no JVD, supple, symmetrical, trachea midline and thyroid not enlarged, symmetric, no tenderness/mass/nodules Lymph nodes: Cervical, supraclavicular, and axillary nodes normal. Resp: clear to auscultation bilaterally Back: symmetric, no curvature. ROM normal. No CVA tenderness. Cardio: regular rate and rhythm, S1, S2 normal, no murmur, click, rub or gallop GI: soft, non-tender; bowel sounds normal; no masses,  no organomegaly Extremities: extremities normal, atraumatic, no cyanosis or edema  ECOG PERFORMANCE STATUS: 1 - Symptomatic but completely ambulatory  Blood pressure (!) 141/65, pulse 74, temperature 97.9 F (36.6 C), temperature source Oral, resp. rate 18, height 5\' 2"  (1.575 m), weight 151 lb 6.4 oz (68.7 kg), SpO2 96 %.  LABORATORY DATA: Lab Results  Component Value Date   WBC 4.8 06/08/2018   HGB 13.0 06/08/2018   HCT 37.8 06/08/2018   MCV 93.8 06/08/2018   PLT 174 06/08/2018      Chemistry      Component Value Date/Time   NA 141 06/08/2018 0932   NA 139 08/05/2017 0902   K 4.2 06/08/2018 0932   K 4.1 08/05/2017 0902   CL 106 06/08/2018 0932   CO2 28  06/08/2018 0932   CO2 28 08/05/2017 0902   BUN 12 06/08/2018 0932   BUN 10.1 08/05/2017 0902   CREATININE 0.73 06/08/2018 0932   CREATININE 0.8 08/05/2017 0902   GLU 134 (H) 02/18/2017 1410      Component Value Date/Time   CALCIUM 10.0 06/08/2018 0932   CALCIUM 9.5 08/05/2017 0902   ALKPHOS 104 06/08/2018 0932   ALKPHOS 111 08/05/2017 0902   AST 36 06/08/2018 0932   AST 33 08/05/2017 0902   ALT 30 06/08/2018 0932   ALT 34 08/05/2017 0902   BILITOT 1.0 06/08/2018 0932   BILITOT 1.39 (H) 08/05/2017 0902       RADIOGRAPHIC STUDIES: No results found. ASSESSMENT AND PLAN:  This is a very pleasant 72 years old white female with metastatic non-small cell lung cancer, adenocarcinoma with no actionable mutations status post systemic chemotherapy with carboplatin and Alimta and she is currently undergoing treatment with second line immunotherapy with Nivolumab status post 6 cycles. This was followed by immunotherapy with Nivolumab 480 mg IV every 4 weeks status post 16 cycles. She has been tolerating this treatment well with no concerning adverse effects. I recommended for the patient to proceed with cycle #17 today as scheduled. I will see her back for follow-up visit in 4 weeks for evaluation before starting the next cycle of her treatment. For hypertension she was advised to continue on her current blood pressure medication and to monitor it closely at home. The patient was advised to call immediately if she has any concerning symptoms in the interval. The patient voices understanding of current disease status and treatment options and is in agreement with the current care plan. All questions were answered. The patient knows to call the clinic with any problems, questions or concerns. We can certainly see the patient much sooner if necessary.  Disclaimer: This note was dictated with voice recognition software. Similar sounding words can inadvertently be transcribed and may not be  corrected upon review.

## 2018-06-08 NOTE — Patient Instructions (Signed)
[facility] Discharge Instructions for Patients Receiving Chemotherapy  Today you received the following chemotherapy agents Opdivo  To help prevent nausea and vomiting after your treatment, we encourage you to take your nausea medication as directed   If you develop nausea and vomiting that is not controlled by your nausea medication, call the clinic.   BELOW ARE SYMPTOMS THAT SHOULD BE REPORTED IMMEDIATELY:  *FEVER GREATER THAN 100.5 F  *CHILLS WITH OR WITHOUT FEVER  NAUSEA AND VOMITING THAT IS NOT CONTROLLED WITH YOUR NAUSEA MEDICATION  *UNUSUAL SHORTNESS OF BREATH  *UNUSUAL BRUISING OR BLEEDING  TENDERNESS IN MOUTH AND THROAT WITH OR WITHOUT PRESENCE OF ULCERS  *URINARY PROBLEMS  *BOWEL PROBLEMS  UNUSUAL RASH Items with * indicate a potential emergency and should be followed up as soon as possible.  Feel free to call the clinic should you have any questions or concerns. The clinic phone number is [phone].  Please show the CHEMO ALERT CARD at check-in to the Emergency Department and triage nurse.   

## 2018-06-08 NOTE — Telephone Encounter (Signed)
Scheduled appt per 11/5 los - pt to get an updated schedule in treatment area.

## 2018-06-14 ENCOUNTER — Telehealth: Payer: Self-pay | Admitting: Internal Medicine

## 2018-06-14 NOTE — Telephone Encounter (Signed)
° °  Patient wants to discuss ablation

## 2018-06-15 ENCOUNTER — Telehealth: Payer: Self-pay

## 2018-06-15 DIAGNOSIS — I4819 Other persistent atrial fibrillation: Secondary | ICD-10-CM

## 2018-06-15 NOTE — Telephone Encounter (Signed)
Advised Pt AF burden since 05/07/2018 was around 50%.  Pt to think about ablation, gave dates in December.  Pt will call this nurse back if she decides to proceed.

## 2018-06-15 NOTE — Telephone Encounter (Signed)
Follow up:   Patient calling back from yesterday. Would like for someone to call back.

## 2018-06-15 NOTE — Telephone Encounter (Signed)
Follow Up:     Pt is calling, says she needs to talk to somebody.

## 2018-06-15 NOTE — Telephone Encounter (Signed)
Pt would like to schedule ablation for July 29, 2018.  Advised Pt I would have to discuss with Dr. [name].  Will call Pt back.

## 2018-06-15 NOTE — Telephone Encounter (Signed)
AF burden is 49% since 05/07/2018.

## 2018-06-15 NOTE — Telephone Encounter (Signed)
Spoke with Pt.  She is unsure if she wants to go forward with ablation at this time.  She feels like maybe she has been in afib less than previously?  Will send to device to request a remote check.  Will determine afib burden and discuss with Dr. [name].

## 2018-06-15 NOTE — Telephone Encounter (Signed)
Error

## 2018-06-17 NOTE — Telephone Encounter (Signed)
Pt called wanting to follow up on the scheduling of the ablation.  Please give her a call back.

## 2018-06-18 MED ORDER — METOPROLOL TARTRATE 100 MG PO TABS: 100.0000 mg | ORAL_TABLET | Freq: Once | ORAL | 0 refills | Status: DC

## 2018-06-18 NOTE — Telephone Encounter (Signed)
Call placed to [facility].  Requested BMP and CBC be drawn on 07/06/2018 at Pt visit.  It appears this is what they draw monthly.  Secretary took message.   Call placed to Pt.  Pt scheduled for afib ablation on 07/29/2018 at 10:30 am with Dr. [name].    Will mail instruction letter to Pt.

## 2018-06-21 ENCOUNTER — Telehealth: Payer: Self-pay | Admitting: Internal Medicine

## 2018-06-21 NOTE — Telephone Encounter (Signed)
New Message:   Patient calling about some lab work results she will be having surgery in December. Please call patient.

## 2018-06-22 ENCOUNTER — Telehealth: Payer: Self-pay | Admitting: Internal Medicine

## 2018-06-22 NOTE — Telephone Encounter (Signed)
Returned call to Pt.  Advised cancer center will be drawing labs we need prior to ablation.  Pt indicates understanding.  All questions answered.

## 2018-06-22 NOTE — Telephone Encounter (Signed)
New Message:    Pt would like for you to call hger today if possible please. She says she would like her lab on the 3rd of December did at the [facility] She says she have to go the [facility] on the 3rd of December also. It would be great to have them both at the same place.

## 2018-07-06 ENCOUNTER — Inpatient Hospital Stay: Payer: Medicare Other | Attending: Internal Medicine

## 2018-07-06 ENCOUNTER — Inpatient Hospital Stay: Payer: Medicare Other

## 2018-07-06 ENCOUNTER — Telehealth: Payer: Self-pay | Admitting: Internal Medicine

## 2018-07-06 ENCOUNTER — Inpatient Hospital Stay (HOSPITAL_BASED_OUTPATIENT_CLINIC_OR_DEPARTMENT_OTHER): Payer: Medicare Other | Admitting: Internal Medicine

## 2018-07-06 ENCOUNTER — Encounter: Payer: Self-pay | Admitting: Internal Medicine

## 2018-07-06 VITALS — BP 126/60 | HR 76 | Temp 98.0°F | Resp 18 | Ht 62.0 in | Wt 150.4 lb

## 2018-07-06 DIAGNOSIS — C773 Secondary and unspecified malignant neoplasm of axilla and upper limb lymph nodes: Secondary | ICD-10-CM

## 2018-07-06 DIAGNOSIS — C3490 Malignant neoplasm of unspecified part of unspecified bronchus or lung: Secondary | ICD-10-CM

## 2018-07-06 DIAGNOSIS — Z5112 Encounter for antineoplastic immunotherapy: Secondary | ICD-10-CM | POA: Insufficient documentation

## 2018-07-06 DIAGNOSIS — Z95828 Presence of other vascular implants and grafts: Secondary | ICD-10-CM

## 2018-07-06 DIAGNOSIS — C3411 Malignant neoplasm of upper lobe, right bronchus or lung: Secondary | ICD-10-CM | POA: Diagnosis present

## 2018-07-06 DIAGNOSIS — C3491 Malignant neoplasm of unspecified part of right bronchus or lung: Secondary | ICD-10-CM

## 2018-07-06 DIAGNOSIS — Z79899 Other long term (current) drug therapy: Secondary | ICD-10-CM | POA: Insufficient documentation

## 2018-07-06 DIAGNOSIS — I480 Paroxysmal atrial fibrillation: Secondary | ICD-10-CM

## 2018-07-06 LAB — CBC WITH DIFFERENTIAL (CANCER CENTER ONLY)
ABS IMMATURE GRANULOCYTES: 0.01 10*3/uL (ref 0.00–0.07)
Basophils Absolute: 0 10*3/uL (ref 0.0–0.1)
Basophils Relative: 1 %
Eosinophils Absolute: 0.2 10*3/uL (ref 0.0–0.5)
Eosinophils Relative: 3 %
HEMATOCRIT: 40 % (ref 36.0–46.0)
HEMOGLOBIN: 13.5 g/dL (ref 12.0–15.0)
IMMATURE GRANULOCYTES: 0 %
LYMPHS ABS: 1.2 10*3/uL (ref 0.7–4.0)
LYMPHS PCT: 20 %
MCH: 32 pg (ref 26.0–34.0)
MCHC: 33.8 g/dL (ref 30.0–36.0)
MCV: 94.8 fL (ref 80.0–100.0)
MONOS PCT: 9 %
Monocytes Absolute: 0.6 10*3/uL (ref 0.1–1.0)
NEUTROS ABS: 4 10*3/uL (ref 1.7–7.7)
NEUTROS PCT: 67 %
PLATELETS: 187 10*3/uL (ref 150–400)
RBC: 4.22 MIL/uL (ref 3.87–5.11)
RDW: 13.2 % (ref 11.5–15.5)
WBC Count: 5.9 10*3/uL (ref 4.0–10.5)
nRBC: 0 % (ref 0.0–0.2)

## 2018-07-06 LAB — CMP (CANCER CENTER ONLY)
ALT: 42 U/L (ref 0–44)
AST: 35 U/L (ref 15–41)
Albumin: 4.1 g/dL (ref 3.5–5.0)
Alkaline Phosphatase: 122 U/L (ref 38–126)
Anion gap: 9 (ref 5–15)
BILIRUBIN TOTAL: 1.4 mg/dL — AB (ref 0.3–1.2)
BUN: 11 mg/dL (ref 8–23)
CHLORIDE: 106 mmol/L (ref 98–111)
CO2: 26 mmol/L (ref 22–32)
CREATININE: 0.72 mg/dL (ref 0.44–1.00)
Calcium: 9.8 mg/dL (ref 8.9–10.3)
GFR, Est AFR Am: >60 mL/min (ref >60)
Glucose, Bld: 98 mg/dL (ref 70–99)
Potassium: 4.2 mmol/L (ref 3.5–5.1)
Sodium: 141 mmol/L (ref 135–145)
Total Protein: 7.2 g/dL (ref 6.5–8.1)

## 2018-07-06 LAB — TSH: TSH: 1.106 u[IU]/mL (ref 0.308–3.960)

## 2018-07-06 MED ORDER — SODIUM CHLORIDE 0.9 % IV SOLN
480.0000 mg | Freq: Once | INTRAVENOUS | Status: AC
  Administered 2018-07-06: 480 mg via INTRAVENOUS
  Filled 2018-07-06: qty 48

## 2018-07-06 MED ORDER — SODIUM CHLORIDE 0.9 % IV SOLN
Freq: Once | INTRAVENOUS | Status: AC
  Administered 2018-07-06: 12:00:00 via INTRAVENOUS
  Filled 2018-07-06: qty 250

## 2018-07-06 MED ORDER — SODIUM CHLORIDE 0.9% FLUSH
10.0000 mL | INTRAVENOUS | Status: DC | PRN
  Administered 2018-07-06: 10 mL
  Filled 2018-07-06: qty 10

## 2018-07-06 MED ORDER — HEPARIN SOD (PORK) LOCK FLUSH 100 UNIT/ML IV SOLN
500.0000 [IU] | Freq: Once | INTRAVENOUS | Status: AC | PRN
  Administered 2018-07-06: 500 [IU]
  Filled 2018-07-06: qty 5

## 2018-07-06 MED ORDER — SODIUM CHLORIDE 0.9% FLUSH
10.0000 mL | INTRAVENOUS | Status: DC | PRN
  Administered 2018-07-06: 10 mL via INTRAVENOUS
  Filled 2018-07-06: qty 10

## 2018-07-06 NOTE — Progress Notes (Signed)
[place] Telephone:(336) [phone]   Fax:(336) [phone]  OFFICE PROGRESS NOTE  Harris, [name], [name] [address] [place] [address]  DIAGNOSIS: Metastatic non-small cell lung cancer, adenocarcinoma. This was initially diagnosed as stage IB (T2a, N0, M0) in March of 2014, status post right upper lobectomy with lymph node dissection but the patient has evidence for disease recurrence in the right axilla status post resection.  PRIOR THERAPY:  1) Status post right axillary lymph node biopsy on 05/01/2014 under the care of Dr. Roxan Hockey. 2) Systemic chemotherapy with carboplatin for AUC of 5 and Alimta 500 mg/M2 every 3 weeks. First cycle 05/31/2014. Status post 6 cycles. Last dose was given 09/22/2014. 3) palliative radiotherapy to the subcarinal lymph node under the care of Dr. Sondra Come. 4) Nivolumab 240 mg IV every 2 weeks. First dose 12/23/2016. Status post 6 cycles.  CURRENT THERAPY:  Nivolumab 480 mg IV every 4 weeks. First dose 03/17/2017. Status post 17 cycles.  INTERVAL HISTORY: Nancy Hodges 72 y.o. female returns to the clinic today for follow-up visit.  The patient is feeling fine today with no specific complaints except for mild fatigue.  She is scheduled for heart and coronary scans in few weeks.  She denied having any current chest pain, shortness of breath, cough or hemoptysis.  She denied having any fever or chills.  She has no nausea, vomiting, diarrhea or constipation.  She has no significant weight loss or night sweats.  She is here today for evaluation before starting cycle #18 of her treatment.  MEDICAL HISTORY: Past Medical History:  Diagnosis Date  . Allergic rhinitis   . Anxiety   . Arthritis   . Asthma   . Atrial flutter ([place])   . Cholelithiases 09/26/2015  . Diverticulosis   . Encounter for antineoplastic immunotherapy 12/09/2016  . GERD (gastroesophageal reflux disease)   . H/O hiatal hernia   . History of blood transfusion     . History of chemotherapy   . History of kidney stones   . History of radiation therapy   . Internal hemorrhoid   . Lung cancer ([place])    a. Stage IB non-small cell carcinoma, s/p R VATS, wedge resection, RU lobectomy 10/2012.  . Paroxysmal atrial fibrillation ([facility])    a. anticoagulated with Xarelto  . Pneumonia 06/2016   [facility]  . Presence of permanent cardiac pacemaker   . Pulmonary nodule   . Tachycardia-bradycardia syndrome ([place])    a. 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    ALLERGIES:  is allergic to chlorpheniramine-dm; prednisone; septra [sulfamethoxazole-trimethoprim]; penicillins; and vioxx [rofecoxib].  MEDICATIONS:  Current Outpatient Medications  Medication Sig Dispense Refill  . acetaminophen (TYLENOL) 500 MG tablet Take 500 mg by mouth 3 (three) times daily as needed for moderate pain or fever.     [name] albuterol (PROVENTIL HFA;VENTOLIN HFA) 108 (90 BASE) MCG/ACT inhaler Inhale 1-2 puffs into the lungs every 6 (six) hours as needed for wheezing or shortness of breath.     . calcium carbonate (TUMS - DOSED IN MG ELEMENTAL CALCIUM) 500 MG chewable tablet Chew 1 tablet by mouth as needed for indigestion or heartburn.    [name] CARTIA XT 240 MG 24 hr capsule TAKE 1 CAPSULE(240 MG) BY MOUTH DAILY (Patient taking differently: Take 240 mg by mouth daily. ) 90 capsule 3  . dimenhyDRINATE (DRAMAMINE) 50 MG tablet Take 12.5 mg by mouth every 8 (eight) hours as needed for dizziness.    [name]  ipratropium-albuterol (DUONEB) 0.5-2.5 (3) MG/3ML SOLN Take 3 mLs by nebulization every 6 (six) hours as needed (shortness of breath or wheezing).     [name] lidocaine-prilocaine (EMLA) cream Apply 1 application topically as needed. (Patient taking differently: Apply 1 application topically as needed (for port). ) 30 g 4  . loratadine (CLARITIN) 10 MG tablet Take 10 mg by mouth daily.    . metoprolol tartrate (LOPRESSOR) 100 MG tablet Take 1 tablet (100 mg total) by mouth once for 1 dose. Take one  tablet 2 hours prior to cardiac CT 1 tablet 0  . Polyvinyl Alcohol-Povidone (REFRESH OP) Place 1 drop into both eyes daily as needed (dry eyes).    [name] tablet TAKE 1 TABLET(20 MG) BY MOUTH DAILY WITH SUPPER (Patient taking differently: Take 20 mg by mouth daily with supper. ) 30 tablet 5   No current facility-administered medications for this visit.    Facility-Administered Medications Ordered in Other Visits  Medication Dose Route Frequency Provider Last Rate Last Dose  . sodium chloride flush (NS) 0.9 % injection 10 mL  10 mL Intravenous PRN Curt Bears, MD   10 mL at 07/06/18 1055    SURGICAL HISTORY:  Past Surgical History:  Procedure Laterality Date  . CARDIOVERSION N/A 02/19/2018   Procedure: CARDIOVERSION;  Surgeon: Sanda Klein, MD;  Location: [facility] ENDOSCOPY;  Service: Cardiovascular;  Laterality: N/A;  . CARDIOVERSION N/A 04/30/2018   Procedure: CARDIOVERSION;  Surgeon: Thayer Headings, MD;  Location: [facility] ENDOSCOPY;  Service: Cardiovascular;  Laterality: N/A;  . CATARACT EXTRACTION W/PHACO Right 08/14/2016   Procedure: CATARACT EXTRACTION PHACO AND INTRAOCULAR LENS PLACEMENT RIGHT EYE CDE 10.53;  Surgeon: Tonny Branch, MD;  [facility];  Service: Ophthalmology;  Laterality: Right;  right  . CATARACT EXTRACTION W/PHACO Left 09/25/2016   Procedure: CATARACT EXTRACTION PHACO AND INTRAOCULAR LENS PLACEMENT (IOC);  Surgeon: Tonny Branch, MD;  [facility];  Service: Ophthalmology;  Laterality: Left;  left CDE 8.81  . COLONOSCOPY     [facility]: cattered sigmoid diverticula, internal hemorrhoids, sessile polyp in ascending and mid transverse colon (tubular adenoma).   . colonoscopy with polypectomy  2015  . ESOPHAGOGASTRODUODENOSCOPY     [facility]: bile reflux in stomach, mild gastritis, hiatal hernia  . ESOPHAGOGASTRODUODENOSCOPY N/A 01/31/2016   Procedure: ESOPHAGOGASTRODUODENOSCOPY (EGD);  Surgeon: [name], MD;  [facility];  Service:  Endoscopy;  Laterality: N/A;  1200  . IR FLUORO GUIDE PORT INSERTION RIGHT  06/30/2017  . IR US GUIDE VASC ACCESS RIGHT  06/30/2017  . LOBECTOMY Right 10/27/2012   Procedure: LOBECTOMY;  Surgeon: [name], MD;  Location: [facility];  Service: Thoracic;  Laterality: Right;   RIGHT UPPER LOBECTOMY & Node Dissection  . LYMPH NODE BIOPSY Right 05/01/2014   Procedure: LYMPH NODE BIOPSY, Right Axillary;  Surgeon: [name], MD;  Location: [facility];  Service: Thoracic;  Laterality: Right;  . PACEMAKER IMPLANT N/A 10/30/2017   SJM Assurity MRI PPM implanted by Dr [name] once leadless pacemaker battery depleted  . PARTIAL HYSTERECTOMY    . PERMANENT PACEMAKER INSERTION N/A 09/14/2012   Nanostim (SJM) leadless pacemaker (LEADLESS II STUDY PATIENT) implanted by Dr [name], no longer functioning, device abandoned.  [name] VIDEO ASSISTED THORACOSCOPY (VATS)/WEDGE RESECTION Right 10/27/2012   Procedure: VIDEO ASSISTED THORACOSCOPY (VATS),RGHT UPPER LOBE LUNG WEDGE RESECTION;  Surgeon: [name], MD;  Location: [place];  Service: Thoracic;  Laterality: Right;    REVIEW OF SYSTEMS:  A comprehensive  review of systems was negative except for: Constitutional: positive for fatigue   PHYSICAL EXAMINATION: General appearance: alert, cooperative, fatigued and no distress Head: Normocephalic, without obvious abnormality, atraumatic Neck: no adenopathy, no JVD, supple, symmetrical, trachea midline and thyroid not enlarged, symmetric, no tenderness/mass/nodules Lymph nodes: Cervical, supraclavicular, and axillary nodes normal. Resp: clear to auscultation bilaterally Back: symmetric, no curvature. ROM normal. No CVA tenderness. Cardio: regular rate and rhythm, S1, S2 normal, no murmur, click, rub or gallop GI: soft, non-tender; bowel sounds normal; no masses,  no organomegaly Extremities: extremities normal, atraumatic, no cyanosis or edema  ECOG PERFORMANCE STATUS: 1 - Symptomatic but completely  ambulatory  Blood pressure 126/60, pulse 76, temperature 98 F (36.7 C), temperature source Oral, resp. rate 18, height 5\' 2"  (1.575 m), weight 150 lb 6.4 oz (68.2 kg), SpO2 97 %.  LABORATORY DATA: Lab Results  Component Value Date   WBC 5.9 07/06/2018   HGB 13.5 07/06/2018   HCT 40.0 07/06/2018   MCV 94.8 07/06/2018   PLT 187 07/06/2018      Chemistry      Component Value Date/Time   NA 141 06/08/2018 0932   NA 139 08/05/2017 0902   K 4.2 06/08/2018 0932   K 4.1 08/05/2017 0902   CL 106 06/08/2018 0932   CO2 28 06/08/2018 0932   CO2 28 08/05/2017 0902   BUN 12 06/08/2018 0932   BUN 10.1 08/05/2017 0902   CREATININE 0.73 06/08/2018 0932   CREATININE 0.8 08/05/2017 0902   GLU 134 (H) 02/18/2017 1410      Component Value Date/Time   CALCIUM 10.0 06/08/2018 0932   CALCIUM 9.5 08/05/2017 0902   ALKPHOS 104 06/08/2018 0932   ALKPHOS 111 08/05/2017 0902   AST 36 06/08/2018 0932   AST 33 08/05/2017 0902   ALT 30 06/08/2018 0932   ALT 34 08/05/2017 0902   BILITOT 1.0 06/08/2018 0932   BILITOT 1.39 (H) 08/05/2017 0902       RADIOGRAPHIC STUDIES: No results found. ASSESSMENT AND PLAN:  This is a very pleasant 72 years old white female with metastatic non-small cell lung cancer, adenocarcinoma with no actionable mutations status post systemic chemotherapy with carboplatin and Alimta and she is currently undergoing treatment with second line immunotherapy with Nivolumab status post 6 cycles. This was followed by immunotherapy with Nivolumab 480 mg IV every 4 weeks status post 17 cycles. The patient continues to tolerate this treatment well with no concerning adverse effects. I recommended for her to proceed with cycle #18 today as scheduled. I will see her back for follow-up visit in 4 weeks for evaluation before the next cycle of her treatment. I will order her restaging scan after the next cycle of her treatment. The patient was advised to call immediately if she has any  concerning symptoms in the interval. The patient voices understanding of current disease status and treatment options and is in agreement with the current care plan. All questions were answered. The patient knows to call the clinic with any problems, questions or concerns. We can certainly see the patient much sooner if necessary.  Disclaimer: This note was dictated with voice recognition software. Similar sounding words can inadvertently be transcribed and may not be corrected upon review.

## 2018-07-06 NOTE — Telephone Encounter (Signed)
Scheduled appt per 12/3 los - pt to get an updated schedule next visit.

## 2018-07-06 NOTE — Patient Instructions (Signed)
[facility] Discharge Instructions for Patients Receiving Chemotherapy  Today you received the following chemotherapy agents Nivolumab (OPDIVO).  To help prevent nausea and vomiting after your treatment, we encourage you to take your nausea medication as prescribed.   If you develop nausea and vomiting that is not controlled by your nausea medication, call the clinic.   BELOW ARE SYMPTOMS THAT SHOULD BE REPORTED IMMEDIATELY:  *FEVER GREATER THAN 100.5 F  *CHILLS WITH OR WITHOUT FEVER  NAUSEA AND VOMITING THAT IS NOT CONTROLLED WITH YOUR NAUSEA MEDICATION  *UNUSUAL SHORTNESS OF BREATH  *UNUSUAL BRUISING OR BLEEDING  TENDERNESS IN MOUTH AND THROAT WITH OR WITHOUT PRESENCE OF ULCERS  *URINARY PROBLEMS  *BOWEL PROBLEMS  UNUSUAL RASH Items with * indicate a potential emergency and should be followed up as soon as possible.  Feel free to call the clinic should you have any questions or concerns. The clinic phone number is [phone].  Please show the CHEMO ALERT CARD at check-in to the Emergency Department and triage nurse.   

## 2018-07-20 ENCOUNTER — Telehealth (HOSPITAL_COMMUNITY): Payer: Self-pay | Admitting: Emergency Medicine

## 2018-07-20 NOTE — Telephone Encounter (Signed)
Reaching out to patient to offer assistance, answer question regarding upcoming cardiac imaging study; unable to reach patient, left message on voicemail with name and callback number Marchia Bond RN Navigator Cardiac Imaging  [phone]

## 2018-07-21 ENCOUNTER — Telehealth (HOSPITAL_COMMUNITY): Payer: Self-pay | Admitting: Emergency Medicine

## 2018-07-21 NOTE — Telephone Encounter (Signed)
Pt calling with questions about length of exam and if we can use her power port-o-cath in the R chest; according to patients pocket card, it is a [employer] Power port (contact # [phone]); pt getting cancer treatment infusions, last being 12/3 and next being 12/31; I called bard who verified that these ports are safe to use for power injection CTs Nancy Hodges [phone]

## 2018-07-22 ENCOUNTER — Ambulatory Visit (HOSPITAL_COMMUNITY)
Admission: RE | Admit: 2018-07-22 | Discharge: 2018-07-22 | Disposition: A | Discharge status: Home / Self Care | Payer: Medicare Other | Source: Ambulatory Visit | Attending: Internal Medicine | Admitting: Internal Medicine

## 2018-07-22 ENCOUNTER — Ambulatory Visit (HOSPITAL_COMMUNITY): Admission: RE | Admit: 2018-07-22 | Payer: Medicare Other | Source: Ambulatory Visit

## 2018-07-22 DIAGNOSIS — I4819 Other persistent atrial fibrillation: Secondary | ICD-10-CM | POA: Insufficient documentation

## 2018-07-22 MED ORDER — IOPAMIDOL (ISOVUE-370) INJECTION 76%
80.0000 mL | Freq: Once | INTRAVENOUS | Status: AC | PRN
  Administered 2018-07-22: 80 mL via INTRAVENOUS

## 2018-07-22 MED ORDER — HEPARIN SOD (PORK) LOCK FLUSH 100 UNIT/ML IV SOLN
INTRAVENOUS | Status: AC
  Administered 2018-07-22: 500 [IU]
  Filled 2018-07-22: qty 5

## 2018-07-27 ENCOUNTER — Telehealth: Payer: Self-pay | Admitting: *Deleted

## 2018-07-27 NOTE — Telephone Encounter (Signed)
Oncology Nurse Navigator Documentation  Oncology Nurse Navigator Flowsheets 07/27/2018  Navigator Location [facility]  Navigator Encounter Type Telephone/I called Nancy Hodges to check on her. I was unable to reach but did leave a vm message to call if needed.   Telephone Outgoing Call  Treatment Phase Treatment  Barriers/Navigation Needs No Needs  Interventions None required  Acuity Level 1  Time Spent with Patient 15

## 2018-07-29 ENCOUNTER — Ambulatory Visit (HOSPITAL_COMMUNITY): Admission: RE | Admit: 2018-07-29 | Payer: Medicare Other | Source: Home / Self Care | Admitting: Internal Medicine

## 2018-07-29 ENCOUNTER — Encounter (HOSPITAL_COMMUNITY): Payer: Self-pay | Admitting: Certified Registered Nurse Anesthetist

## 2018-07-29 ENCOUNTER — Telehealth: Payer: Self-pay | Admitting: Internal Medicine

## 2018-07-29 ENCOUNTER — Encounter (HOSPITAL_COMMUNITY): Admission: RE | Payer: Self-pay | Source: Home / Self Care

## 2018-07-29 SURGERY — ATRIAL FIBRILLATION ABLATION
Anesthesia: General

## 2018-07-29 NOTE — Telephone Encounter (Signed)
Follow up  Pt is going to urgent care and said to leave voicemail message if she is not home.

## 2018-07-29 NOTE — Telephone Encounter (Signed)
Patient had to cancel ablation today because she has the stomach virus. She would like a call to reschedule the procedure.

## 2018-07-30 ENCOUNTER — Telehealth: Payer: Self-pay | Admitting: *Deleted

## 2018-07-30 NOTE — Telephone Encounter (Signed)
Oncology Nurse Navigator Documentation  Oncology Nurse Navigator Flowsheets 07/30/2018  Navigator Location [facility]  Navigator Encounter Type Telephone/I called to check on Nancy Hodges.  She is not feeling well and states she has a stomach bug".  She states her family has the "stomach bag".  She is having diarrhea and feels weak.  I update her that she should be seen due to her IO therapy and if this is not a side effect of it.  He states she is unable to come but if her diarrhea gets worse, she will call or go to [facility].  I again encouraged her to be seen but is is not wanting to.  She is drinking without difficulty and keeping it "down".  I asked that she call me if she would like to be seen today.   Telephone Outgoing Call  Treatment Phase Treatment  Barriers/Navigation Needs Education  Education Other  Interventions Education  Education Method Verbal  Acuity Level 1  Time Spent with Patient 15

## 2018-08-02 ENCOUNTER — Ambulatory Visit (INDEPENDENT_AMBULATORY_CARE_PROVIDER_SITE_OTHER): Payer: Medicare Other

## 2018-08-02 DIAGNOSIS — I495 Sick sinus syndrome: Secondary | ICD-10-CM | POA: Diagnosis not present

## 2018-08-02 DIAGNOSIS — R001 Bradycardia, unspecified: Secondary | ICD-10-CM

## 2018-08-02 NOTE — Progress Notes (Signed)
Remote pacemaker transmission.   

## 2018-08-03 ENCOUNTER — Inpatient Hospital Stay: Payer: Medicare Other

## 2018-08-03 ENCOUNTER — Encounter: Payer: Self-pay | Admitting: Internal Medicine

## 2018-08-03 ENCOUNTER — Telehealth: Payer: Self-pay

## 2018-08-03 ENCOUNTER — Telehealth: Payer: Self-pay | Admitting: Internal Medicine

## 2018-08-03 ENCOUNTER — Inpatient Hospital Stay (HOSPITAL_BASED_OUTPATIENT_CLINIC_OR_DEPARTMENT_OTHER): Payer: Medicare Other | Admitting: Internal Medicine

## 2018-08-03 VITALS — BP 147/61 | HR 81 | Temp 97.7°F | Resp 18 | Ht 62.0 in | Wt 148.4 lb

## 2018-08-03 DIAGNOSIS — C3490 Malignant neoplasm of unspecified part of unspecified bronchus or lung: Secondary | ICD-10-CM

## 2018-08-03 DIAGNOSIS — C3411 Malignant neoplasm of upper lobe, right bronchus or lung: Secondary | ICD-10-CM | POA: Diagnosis not present

## 2018-08-03 DIAGNOSIS — Z95828 Presence of other vascular implants and grafts: Secondary | ICD-10-CM

## 2018-08-03 DIAGNOSIS — C773 Secondary and unspecified malignant neoplasm of axilla and upper limb lymph nodes: Secondary | ICD-10-CM | POA: Diagnosis not present

## 2018-08-03 DIAGNOSIS — C3491 Malignant neoplasm of unspecified part of right bronchus or lung: Secondary | ICD-10-CM

## 2018-08-03 DIAGNOSIS — Z5112 Encounter for antineoplastic immunotherapy: Secondary | ICD-10-CM

## 2018-08-03 DIAGNOSIS — I480 Paroxysmal atrial fibrillation: Secondary | ICD-10-CM

## 2018-08-03 LAB — CBC WITH DIFFERENTIAL (CANCER CENTER ONLY)
ABS IMMATURE GRANULOCYTES: 0.01 10*3/uL (ref 0.00–0.07)
BASOS ABS: 0 10*3/uL (ref 0.0–0.1)
BASOS PCT: 1 %
EOS ABS: 0.1 10*3/uL (ref 0.0–0.5)
Eosinophils Relative: 1 %
HCT: 37.8 % (ref 36.0–46.0)
Hemoglobin: 12.9 g/dL (ref 12.0–15.0)
IMMATURE GRANULOCYTES: 0 %
Lymphocytes Relative: 29 %
Lymphs Abs: 1.2 10*3/uL (ref 0.7–4.0)
MCH: 31.9 pg (ref 26.0–34.0)
MCHC: 34.1 g/dL (ref 30.0–36.0)
MCV: 93.3 fL (ref 80.0–100.0)
MONOS PCT: 7 %
Monocytes Absolute: 0.3 10*3/uL (ref 0.1–1.0)
NEUTROS ABS: 2.6 10*3/uL (ref 1.7–7.7)
NEUTROS PCT: 62 %
NRBC: 0 % (ref 0.0–0.2)
PLATELETS: 151 10*3/uL (ref 150–400)
RBC: 4.05 MIL/uL (ref 3.87–5.11)
RDW: 12.7 % (ref 11.5–15.5)
WBC: 4.2 10*3/uL (ref 4.0–10.5)

## 2018-08-03 LAB — CUP PACEART REMOTE DEVICE CHECK
Battery Remaining Longevity: 116 mo
Battery Remaining Percentage: 95.5 %
Brady Statistic AP VP Percent: 1 %
Brady Statistic AP VS Percent: 1 %
Brady Statistic AS VP Percent: 1 %
Brady Statistic AS VS Percent: 99 %
Brady Statistic RA Percent Paced: 1 %
Brady Statistic RV Percent Paced: 11 %
Date Time Interrogation Session: 20191230070021
Implantable Lead Implant Date: 20190329
Implantable Lead Implant Date: 20190329
Implantable Lead Location: 753859
Implantable Lead Location: 753860
Implantable Pulse Generator Implant Date: 20190329
Lead Channel Impedance Value: 400 Ohm
Lead Channel Impedance Value: 590 Ohm
Lead Channel Pacing Threshold Amplitude: 1 V
Lead Channel Pacing Threshold Pulse Width: 0.5 ms
Lead Channel Pacing Threshold Pulse Width: 0.5 ms
Lead Channel Sensing Intrinsic Amplitude: 1.8 mV
Lead Channel Sensing Intrinsic Amplitude: 12 mV
Lead Channel Setting Pacing Amplitude: 2 V
Lead Channel Setting Pacing Amplitude: 2.5 V
Lead Channel Setting Pacing Pulse Width: 0.5 ms
Lead Channel Setting Sensing Sensitivity: 2 mV
MDC IDC MSMT BATTERY VOLTAGE: 3.01 V
MDC IDC MSMT LEADCHNL RA PACING THRESHOLD AMPLITUDE: 1 V
Pulse Gen Model: 2272
Pulse Gen Serial Number: 9003317

## 2018-08-03 LAB — CMP (CANCER CENTER ONLY)
ALT: 31 U/L (ref 0–44)
AST: 40 U/L (ref 15–41)
Albumin: 3.8 g/dL (ref 3.5–5.0)
Alkaline Phosphatase: 108 U/L (ref 38–126)
Anion gap: 9 (ref 5–15)
BUN: 5 mg/dL — ABNORMAL LOW (ref 8–23)
CO2: 26 mmol/L (ref 22–32)
Calcium: 9.3 mg/dL (ref 8.9–10.3)
Chloride: 108 mmol/L (ref 98–111)
Creatinine: 0.7 mg/dL (ref 0.44–1.00)
GFR, Est AFR Am: >60 mL/min (ref >60)
GFR, Estimated: >60 mL/min (ref >60)
Glucose, Bld: 104 mg/dL — ABNORMAL HIGH (ref 70–99)
Potassium: 3.8 mmol/L (ref 3.5–5.1)
Sodium: 143 mmol/L (ref 135–145)
Total Bilirubin: 1.3 mg/dL — ABNORMAL HIGH (ref 0.3–1.2)
Total Protein: 6.9 g/dL (ref 6.5–8.1)

## 2018-08-03 LAB — TSH: TSH: 1.266 u[IU]/mL (ref 0.308–3.960)

## 2018-08-03 MED ORDER — SODIUM CHLORIDE 0.9% FLUSH
10.0000 mL | INTRAVENOUS | Status: DC | PRN
  Administered 2018-08-03: 10 mL
  Filled 2018-08-03: qty 10

## 2018-08-03 MED ORDER — SODIUM CHLORIDE 0.9 % IV SOLN
Freq: Once | INTRAVENOUS | Status: AC
  Administered 2018-08-03: 12:00:00 via INTRAVENOUS
  Filled 2018-08-03: qty 250

## 2018-08-03 MED ORDER — SODIUM CHLORIDE 0.9 % IV SOLN
480.0000 mg | Freq: Once | INTRAVENOUS | Status: AC
  Administered 2018-08-03: 480 mg via INTRAVENOUS
  Filled 2018-08-03: qty 48

## 2018-08-03 MED ORDER — HEPARIN SOD (PORK) LOCK FLUSH 100 UNIT/ML IV SOLN
500.0000 [IU] | Freq: Once | INTRAVENOUS | Status: AC | PRN
  Administered 2018-08-03: 500 [IU]
  Filled 2018-08-03: qty 5

## 2018-08-03 MED ORDER — SODIUM CHLORIDE 0.9% FLUSH
10.0000 mL | INTRAVENOUS | Status: DC | PRN
  Administered 2018-08-03: 10 mL via INTRAVENOUS
  Filled 2018-08-03: qty 10

## 2018-08-03 NOTE — Telephone Encounter (Signed)
Rescheduled ablation for October 29, 2018 at 10:30 am.  No further needs.

## 2018-08-03 NOTE — Telephone Encounter (Signed)
Messaged received from Dr. [name]--  Please call her early next week to see if she wants to get back on the schedule vs see me in the office vs both  Call placed to Pt.  Left detailed message per DPR.  Advised Pt to return this nurse call today by 5 pm, or if not Friday.  Left this nurse name and # for call back.

## 2018-08-03 NOTE — Patient Instructions (Signed)
[facility] Discharge Instructions for Patients Receiving Chemotherapy  Today you received the following chemotherapy agents: Nivolumab (Opdivo)  To help prevent nausea and vomiting after your treatment, we encourage you to take your nausea medication as directed.   If you develop nausea and vomiting that is not controlled by your nausea medication, call the clinic.   BELOW ARE SYMPTOMS THAT SHOULD BE REPORTED IMMEDIATELY:  *FEVER GREATER THAN 100.5 F  *CHILLS WITH OR WITHOUT FEVER  NAUSEA AND VOMITING THAT IS NOT CONTROLLED WITH YOUR NAUSEA MEDICATION  *UNUSUAL SHORTNESS OF BREATH  *UNUSUAL BRUISING OR BLEEDING  TENDERNESS IN MOUTH AND THROAT WITH OR WITHOUT PRESENCE OF ULCERS  *URINARY PROBLEMS  *BOWEL PROBLEMS  UNUSUAL RASH Items with * indicate a potential emergency and should be followed up as soon as possible.  Feel free to call the clinic should you have any questions or concerns. The clinic phone number is [phone].  Please show the CHEMO ALERT CARD at check-in to the Emergency Department and triage nurse.  

## 2018-08-03 NOTE — Progress Notes (Signed)
[facility] [phone]   [phone]  OFFICE PROGRESS NOTE  Harris, [name], [name] [address] [place] [address]  DIAGNOSIS: Metastatic non-small cell lung cancer, adenocarcinoma. This was initially diagnosed as stage IB (T2a, N0, M0) in March of 2014, status post right upper lobectomy with lymph node dissection but the patient has evidence for disease recurrence in the right axilla status post resection.  PRIOR THERAPY:  1) Status post right axillary lymph node biopsy on 05/01/2014 under the care of Dr. Roxan Hockey. 2) Systemic chemotherapy with carboplatin for AUC of 5 and Alimta 500 mg/M2 every 3 weeks. First cycle 05/31/2014. Status post 6 cycles. Last dose was given 09/22/2014. 3) palliative radiotherapy to the subcarinal lymph node under the care of Dr. Sondra Come. 4) Nivolumab 240 mg IV every 2 weeks. First dose 12/23/2016. Status post 6 cycles.  CURRENT THERAPY:  Nivolumab 480 mg IV every 4 weeks. First dose 03/17/2017. Status post 18 cycles.  INTERVAL HISTORY: Nancy Hodges 72 y.o. female returns to the clinic today for follow-up visit.  The patient is feeling fine today with no concerning complaints.  She had to delay the cardiac ablation because of persistent episodes of diarrhea for several days.  She is feeling much better now.  She thinks that she has viral gastroenteritis at that time.  She denied having any current chest pain, shortness of breath, cough or hemoptysis.  She has no current nausea, vomiting, diarrhea or constipation.  She has no skin rash.  She has no weight loss or night sweats.  She has been tolerating her treatment with nivolumab fairly well.  The patient is here today for evaluation before starting cycle #19.  MEDICAL HISTORY: Past Medical History:  Diagnosis Date  . Allergic rhinitis   . Anxiety   . Arthritis   . Asthma   . Atrial flutter ([place])   . Cholelithiases 09/26/2015  . Diverticulosis   .  Encounter for antineoplastic immunotherapy 12/09/2016  . GERD (gastroesophageal reflux disease)   . H/O hiatal hernia   . History of blood transfusion   . History of chemotherapy   . History of kidney stones   . History of radiation therapy   . Internal hemorrhoid   . Lung cancer ([place])    a. Stage IB non-small cell carcinoma, s/p R VATS, wedge resection, RU lobectomy 10/2012.  . Paroxysmal atrial fibrillation ([facility])    a. anticoagulated with Xarelto  . Pneumonia 06/2016   [facility]  . Presence of permanent cardiac pacemaker   . Pulmonary nodule   . Tachycardia-bradycardia syndrome ([place])    a. 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    ALLERGIES:  is allergic to chlorpheniramine-dm; prednisone; septra [sulfamethoxazole-trimethoprim]; vioxx [rofecoxib]; and penicillins.  MEDICATIONS:  Current Outpatient Medications  Medication Sig Dispense Refill  . acetaminophen (TYLENOL) 500 MG tablet Take 500 mg by mouth 3 (three) times daily as needed for moderate pain or fever.     [name] albuterol (PROVENTIL HFA;VENTOLIN HFA) 108 (90 BASE) MCG/ACT inhaler Inhale 2 puffs into the lungs every 6 (six) hours as needed for wheezing or shortness of breath.     . calcium carbonate (TUMS - DOSED IN MG ELEMENTAL CALCIUM) 500 MG chewable tablet Chew 1 tablet by mouth 2 (two) times daily as needed for indigestion or heartburn.     [name] CARTIA XT 240 MG 24 hr capsule TAKE 1 CAPSULE(240 MG) BY MOUTH DAILY (Patient taking differently: Take 240 mg  by mouth daily. ) 90 capsule 3  . dimenhyDRINATE (DRAMAMINE) 50 MG tablet Take 12.5 mg by mouth every 8 (eight) hours as needed for dizziness.    [name] ipratropium-albuterol (DUONEB) 0.5-2.5 (3) MG/3ML SOLN Take 3 mLs by nebulization every 6 (six) hours as needed (shortness of breath or wheezing).     [name] lidocaine-prilocaine (EMLA) cream Apply 1 application topically as needed. (Patient taking differently: Apply 1 application topically as needed (for port). ) 30 g 4  .  loratadine (CLARITIN) 10 MG tablet Take 10 mg by mouth daily.    . metoprolol tartrate (LOPRESSOR) 100 MG tablet Take 1 tablet (100 mg total) by mouth once for 1 dose. Take one tablet 2 hours prior to cardiac CT 1 tablet 0  . OVER THE COUNTER MEDICATION Take 5 mLs by mouth daily as needed (cough). Hyland's kids cough syrup    . Polyvinyl Alcohol-Povidone (REFRESH OP) Place 1 drop into both eyes daily as needed (dry eyes).    [name] tablet TAKE 1 TABLET(20 MG) BY MOUTH DAILY WITH SUPPER (Patient taking differently: Take 20 mg by mouth daily with supper. ) 30 tablet 5   No current facility-administered medications for this visit.     SURGICAL HISTORY:  Past Surgical History:  Procedure Laterality Date  . CARDIOVERSION N/A 02/19/2018   Procedure: CARDIOVERSION;  Surgeon: Sanda Klein, MD;  Location: [facility] ENDOSCOPY;  Service: Cardiovascular;  Laterality: N/A;  . CARDIOVERSION N/A 04/30/2018   Procedure: CARDIOVERSION;  Surgeon: Thayer Headings, MD;  Location: [facility] ENDOSCOPY;  Service: Cardiovascular;  Laterality: N/A;  . CATARACT EXTRACTION W/PHACO Right 08/14/2016   Procedure: CATARACT EXTRACTION PHACO AND INTRAOCULAR LENS PLACEMENT RIGHT EYE CDE 10.53;  Surgeon: Tonny Branch, MD;  Location: [facility];  Service: Ophthalmology;  Laterality: Right;  right  . CATARACT EXTRACTION W/PHACO Left 09/25/2016   Procedure: CATARACT EXTRACTION PHACO AND INTRAOCULAR LENS PLACEMENT (IOC);  Surgeon: Tonny Branch, MD;  Location: [facility];  Service: Ophthalmology;  Laterality: Left;  left CDE 8.81  . COLONOSCOPY     [facility]: cattered sigmoid diverticula, internal hemorrhoids, sessile polyp in ascending and mid transverse colon (tubular adenoma).   . colonoscopy with polypectomy  2015  . ESOPHAGOGASTRODUODENOSCOPY     [facility]: bile reflux in stomach, mild gastritis, hiatal hernia  . ESOPHAGOGASTRODUODENOSCOPY N/A 01/31/2016   Procedure: ESOPHAGOGASTRODUODENOSCOPY (EGD);  Surgeon: [name], MD;   Location: [facility];  Service: Endoscopy;  Laterality: N/A;  1200  . IR FLUORO GUIDE PORT INSERTION RIGHT  06/30/2017  . IR US GUIDE VASC ACCESS RIGHT  06/30/2017  . LOBECTOMY Right 10/27/2012   Procedure: LOBECTOMY;  Surgeon: [name], MD;  Location: [facility];  Service: Thoracic;  Laterality: Right;   RIGHT UPPER LOBECTOMY & Node Dissection  . LYMPH NODE BIOPSY Right 05/01/2014   Procedure: LYMPH NODE BIOPSY, Right Axillary;  Surgeon: [name], MD;  Location: [facility];  Service: Thoracic;  Laterality: Right;  . PACEMAKER IMPLANT N/A 10/30/2017   SJM Assurity MRI PPM implanted by Dr [name] once leadless pacemaker battery depleted  . PARTIAL HYSTERECTOMY    . PERMANENT PACEMAKER INSERTION N/A 09/14/2012   Nanostim (SJM) leadless pacemaker (LEADLESS II STUDY PATIENT) implanted by Dr [name], no longer functioning, device abandoned.  [name] VIDEO ASSISTED THORACOSCOPY (VATS)/WEDGE RESECTION Right 10/27/2012   Procedure: VIDEO ASSISTED THORACOSCOPY (VATS),RGHT UPPER LOBE LUNG WEDGE RESECTION;  Surgeon: [name], MD;  Location: [facility];  Service: Thoracic;  Laterality: Right;  REVIEW OF SYSTEMS:  A comprehensive review of systems was negative except for: Constitutional: positive for fatigue   PHYSICAL EXAMINATION: General appearance: alert, cooperative and no distress Head: Normocephalic, without obvious abnormality, atraumatic Neck: no adenopathy, no JVD, supple, symmetrical, trachea midline and thyroid not enlarged, symmetric, no tenderness/mass/nodules Lymph nodes: Cervical, supraclavicular, and axillary nodes normal. Resp: clear to auscultation bilaterally Back: symmetric, no curvature. ROM normal. No CVA tenderness. Cardio: regular rate and rhythm, S1, S2 normal, no murmur, click, rub or gallop GI: soft, non-tender; bowel sounds normal; no masses,  no organomegaly Extremities: extremities normal, atraumatic, no cyanosis or edema  ECOG PERFORMANCE STATUS: 1 -  Symptomatic but completely ambulatory  Blood pressure (!) 147/61, pulse 81, temperature 97.7 F (36.5 C), temperature source Oral, resp. rate 18, height 5\' 2"  (1.575 m), weight 148 lb 6.4 oz (67.3 kg), SpO2 98 %.  LABORATORY DATA: Lab Results  Component Value Date   WBC 4.2 08/03/2018   HGB 12.9 08/03/2018   HCT 37.8 08/03/2018   MCV 93.3 08/03/2018   PLT 151 08/03/2018      Chemistry      Component Value Date/Time   NA 143 08/03/2018 0935   NA 139 08/05/2017 0902   K 3.8 08/03/2018 0935   K 4.1 08/05/2017 0902   CL 108 08/03/2018 0935   CO2 26 08/03/2018 0935   CO2 28 08/05/2017 0902   BUN 5 (L) 08/03/2018 0935   BUN 10.1 08/05/2017 0902   CREATININE 0.70 08/03/2018 0935   CREATININE 0.8 08/05/2017 0902   GLU 134 (H) 02/18/2017 1410      Component Value Date/Time   CALCIUM 9.3 08/03/2018 0935   CALCIUM 9.5 08/05/2017 0902   ALKPHOS 108 08/03/2018 0935   ALKPHOS 111 08/05/2017 0902   AST 40 08/03/2018 0935   AST 33 08/05/2017 0902   ALT 31 08/03/2018 0935   ALT 34 08/05/2017 0902   BILITOT 1.3 (H) 08/03/2018 0935   BILITOT 1.39 (H) 08/05/2017 0902       RADIOGRAPHIC STUDIES: Ct Cardiac Morph/pulm Vein W/cm&w/o Ca Score  Addendum Date: 07/23/2018   ADDENDUM REPORT: 07/23/2018 08:48 CLINICAL DATA:  Atrial fibrillation scheduled for an ablation. EXAM: Cardiac CT/CTA TECHNIQUE: The patient was scanned on a Siemens Somatom scanner. FINDINGS: A 120 kV prospective scan was triggered in the descending thoracic aorta at 111 HU's. Gantry rotation speed was 280 msecs and collimation was .9 mm. No beta blockade and no NTG was given. The 3D data set was reconstructed in 5% intervals of the 60-80 % of the R-R cycle. Diastolic phases were analyzed on a dedicated work station using MPR, MIP and VRT modes. The patient received 80 cc of contrast. There is normal pulmonary vein drainage into the left atrium (2 on the right and 2 on the left) with ostial measurements as follows: RUPV:  20.6 x 16.7 mm, widely patent RLPV: 14.2 x 13.2 mm, widely patent LUPV: 15.5 x 10.2 mm, widely patent. Between the left atrial appendage, there is a prominent Coumadin ridge. Superior to the Coumadin ridge there is a small recess where the left upper pulmonary vein originates. There is no definite vascular communication of this recess with any anomalous structure, and no pectinate muscle to suggest accessory appendage. LLPV: 19.2 x 11 mm, widely patent No anomalous pulmonary venous drainage. Normal left atrial appendage, no left atrial appendage thrombus. No intracardiac mass or thrombus. The esophagus runs in the left atrial midline and is not in the proximity to any of  the pulmonary veins. Aorta:  Normal caliber.  No dissection or calcifications. Aortic Valve: Trileaflet. Normal leaflet excursion. No calcifications. Coronary Arteries: Normal coronary origin. Left dominance. The study was performed without use of NTG and insufficient for plaque evaluation. Pulmonary artery: Normal caliber main pulmonary artery. Likely patent foramen ovale. Grossly normal left ventricular function. Severe left atrial chamber enlargement. Moderate right atrial chamber enlargement. Mitral annular calcification. IMPRESSION: 1. There is normal pulmonary vein drainage into the left atrium. 2. Normal left atrial appendage, no left atrial appendage thrombus. No intracardiac mass or thrombus. 3. The esophagus runs in the left atrial midline and is not in the proximity to any of the pulmonary veins. 4. Coronary calcium score is at approximately 414, which is 85th percentile for age and gender matched peers. Calcium present in the LAD, left Circumflex, and RCA. RCA calcium may be poorly estimated due to metallic artifact from intracardiac device. Electronically Signed   By: Cherlynn Kaiser   On: 07/23/2018 08:48   Result Date: 07/23/2018 EXAM: OVER-READ INTERPRETATION  CT CHEST The following report is an over-read performed by radiologist  Dr. Rolm Baptise of [facility] on 07/22/2018. This over-read does not include interpretation of cardiac or coronary anatomy or pathology. The coronary CTA interpretation by the cardiologist is attached. COMPARISON:  05/06/2018 FINDINGS: Vascular: Heart is normal size. Pacer wires noted in the right heart. Aorta is normal caliber. Mild atherosclerotic calcifications in the descending thoracic aorta. Mediastinum/Nodes: No adenopathy in the lower mediastinum or hila. Lungs/Pleura: Scarring in the lung bases. No effusions or confluent opacity. Upper Abdomen: Imaging into the upper abdomen shows no acute findings. Musculoskeletal: Chest wall soft tissues are unremarkable. No acute bony abnormality. IMPRESSION: No acute or significant extra cardiac abnormality. Electronically Signed: By: Rolm Baptise M.D. On: 07/22/2018 11:13   ASSESSMENT AND PLAN:  This is a very pleasant 72 years old white female with metastatic non-small cell lung cancer, adenocarcinoma with no actionable mutations status post systemic chemotherapy with carboplatin and Alimta and she is currently undergoing treatment with second line immunotherapy with Nivolumab status post 6 cycles. This was followed by immunotherapy with Nivolumab 480 mg IV every 4 weeks status post 18 cycles. She continues to tolerate her treatment well with no concerning adverse effects. I recommended for the patient to proceed with cycle #19 today as scheduled. I will see her back for follow-up visit in 4 weeks for evaluation after repeating CT scan of the chest, abdomen and pelvis for restaging of her disease. The patient was advised to call immediately if she has any concerning symptoms in the interval. The patient voices understanding of current disease status and treatment options and is in agreement with the current care plan. All questions were answered. The patient knows to call the clinic with any problems, questions or concerns. We can certainly see  the patient much sooner if necessary.  Disclaimer: This note was dictated with voice recognition software. Similar sounding words can inadvertently be transcribed and may not be corrected upon review.

## 2018-08-03 NOTE — Telephone Encounter (Signed)
Printed AVS.

## 2018-08-27 ENCOUNTER — Ambulatory Visit (HOSPITAL_COMMUNITY)
Admission: RE | Admit: 2018-08-27 | Discharge: 2018-08-27 | Disposition: A | Discharge status: Home / Self Care | Payer: Medicare Other | Source: Ambulatory Visit | Attending: Internal Medicine | Admitting: Internal Medicine

## 2018-08-27 DIAGNOSIS — C3490 Malignant neoplasm of unspecified part of unspecified bronchus or lung: Secondary | ICD-10-CM | POA: Insufficient documentation

## 2018-08-27 MED ORDER — IOHEXOL 300 MG/ML  SOLN
100.0000 mL | Freq: Once | INTRAMUSCULAR | Status: AC | PRN
  Administered 2018-08-27: 100 mL via INTRAVENOUS

## 2018-08-27 MED ORDER — HEPARIN SOD (PORK) LOCK FLUSH 100 UNIT/ML IV SOLN
500.0000 [IU] | Freq: Once | INTRAVENOUS | Status: AC
  Administered 2018-08-27: 500 [IU] via INTRAVENOUS

## 2018-08-27 MED ORDER — SODIUM CHLORIDE (PF) 0.9 % IJ SOLN
INTRAMUSCULAR | Status: AC
  Filled 2018-08-27: qty 50

## 2018-08-27 MED ORDER — HEPARIN SOD (PORK) LOCK FLUSH 100 UNIT/ML IV SOLN
INTRAVENOUS | Status: AC
  Filled 2018-08-27: qty 5

## 2018-08-30 ENCOUNTER — Ambulatory Visit (HOSPITAL_COMMUNITY): Payer: Medicare Other | Admitting: Nurse Practitioner

## 2018-08-31 ENCOUNTER — Encounter: Payer: Self-pay | Admitting: Internal Medicine

## 2018-08-31 ENCOUNTER — Inpatient Hospital Stay: Payer: Medicare Other

## 2018-08-31 ENCOUNTER — Inpatient Hospital Stay (HOSPITAL_BASED_OUTPATIENT_CLINIC_OR_DEPARTMENT_OTHER): Payer: Medicare Other | Admitting: Internal Medicine

## 2018-08-31 ENCOUNTER — Inpatient Hospital Stay: Payer: Medicare Other | Attending: Internal Medicine

## 2018-08-31 ENCOUNTER — Telehealth: Payer: Self-pay

## 2018-08-31 VITALS — BP 138/77 | HR 72 | Temp 97.8°F | Resp 18 | Ht 62.0 in | Wt 150.4 lb

## 2018-08-31 DIAGNOSIS — I10 Essential (primary) hypertension: Secondary | ICD-10-CM | POA: Diagnosis not present

## 2018-08-31 DIAGNOSIS — C3411 Malignant neoplasm of upper lobe, right bronchus or lung: Secondary | ICD-10-CM | POA: Diagnosis present

## 2018-08-31 DIAGNOSIS — C3491 Malignant neoplasm of unspecified part of right bronchus or lung: Secondary | ICD-10-CM

## 2018-08-31 DIAGNOSIS — Z79899 Other long term (current) drug therapy: Secondary | ICD-10-CM | POA: Diagnosis not present

## 2018-08-31 DIAGNOSIS — Z5112 Encounter for antineoplastic immunotherapy: Secondary | ICD-10-CM

## 2018-08-31 DIAGNOSIS — Z95828 Presence of other vascular implants and grafts: Secondary | ICD-10-CM

## 2018-08-31 DIAGNOSIS — C773 Secondary and unspecified malignant neoplasm of axilla and upper limb lymph nodes: Secondary | ICD-10-CM

## 2018-08-31 DIAGNOSIS — I499 Cardiac arrhythmia, unspecified: Secondary | ICD-10-CM

## 2018-08-31 DIAGNOSIS — C3490 Malignant neoplasm of unspecified part of unspecified bronchus or lung: Secondary | ICD-10-CM

## 2018-08-31 DIAGNOSIS — I480 Paroxysmal atrial fibrillation: Secondary | ICD-10-CM

## 2018-08-31 LAB — CBC WITH DIFFERENTIAL (CANCER CENTER ONLY)
Abs Immature Granulocytes: 0.01 10*3/uL (ref 0.00–0.07)
BASOS PCT: 1 %
Basophils Absolute: 0 10*3/uL (ref 0.0–0.1)
Eosinophils Absolute: 0.1 10*3/uL (ref 0.0–0.5)
Eosinophils Relative: 3 %
HCT: 37.9 % (ref 36.0–46.0)
Hemoglobin: 13.1 g/dL (ref 12.0–15.0)
Immature Granulocytes: 0 %
Lymphocytes Relative: 19 %
Lymphs Abs: 1 10*3/uL (ref 0.7–4.0)
MCH: 32.7 pg (ref 26.0–34.0)
MCHC: 34.6 g/dL (ref 30.0–36.0)
MCV: 94.5 fL (ref 80.0–100.0)
Monocytes Absolute: 0.4 10*3/uL (ref 0.1–1.0)
Monocytes Relative: 8 %
Neutro Abs: 3.6 10*3/uL (ref 1.7–7.7)
Neutrophils Relative %: 69 %
PLATELETS: 180 10*3/uL (ref 150–400)
RBC: 4.01 MIL/uL (ref 3.87–5.11)
RDW: 12.8 % (ref 11.5–15.5)
WBC Count: 5.1 10*3/uL (ref 4.0–10.5)
nRBC: 0 % (ref 0.0–0.2)

## 2018-08-31 LAB — CMP (CANCER CENTER ONLY)
ALT: 39 U/L (ref 0–44)
AST: 40 U/L (ref 15–41)
Albumin: 4 g/dL (ref 3.5–5.0)
Alkaline Phosphatase: 134 U/L — ABNORMAL HIGH (ref 38–126)
Anion gap: 9 (ref 5–15)
BUN: 11 mg/dL (ref 8–23)
CO2: 27 mmol/L (ref 22–32)
Calcium: 10 mg/dL (ref 8.9–10.3)
Chloride: 104 mmol/L (ref 98–111)
Creatinine: 0.77 mg/dL (ref 0.44–1.00)
GFR, Est AFR Am: >60 mL/min (ref >60)
Glucose, Bld: 113 mg/dL — ABNORMAL HIGH (ref 70–99)
Potassium: 4.4 mmol/L (ref 3.5–5.1)
Sodium: 140 mmol/L (ref 135–145)
Total Bilirubin: 1.3 mg/dL — ABNORMAL HIGH (ref 0.3–1.2)
Total Protein: 7.3 g/dL (ref 6.5–8.1)

## 2018-08-31 LAB — TSH: TSH: 3.429 u[IU]/mL (ref 0.308–3.960)

## 2018-08-31 MED ORDER — SODIUM CHLORIDE 0.9% FLUSH
10.0000 mL | INTRAVENOUS | Status: DC | PRN
  Administered 2018-08-31: 10 mL via INTRAVENOUS
  Filled 2018-08-31: qty 10

## 2018-08-31 MED ORDER — SODIUM CHLORIDE 0.9 % IV SOLN
Freq: Once | INTRAVENOUS | Status: AC
  Administered 2018-08-31: 10:00:00 via INTRAVENOUS
  Filled 2018-08-31: qty 250

## 2018-08-31 MED ORDER — SODIUM CHLORIDE 0.9 % IV SOLN
480.0000 mg | Freq: Once | INTRAVENOUS | Status: AC
  Administered 2018-08-31: 480 mg via INTRAVENOUS
  Filled 2018-08-31: qty 48

## 2018-08-31 MED ORDER — SODIUM CHLORIDE 0.9% FLUSH
10.0000 mL | INTRAVENOUS | Status: DC | PRN
  Administered 2018-08-31: 10 mL
  Filled 2018-08-31: qty 10

## 2018-08-31 MED ORDER — HEPARIN SOD (PORK) LOCK FLUSH 100 UNIT/ML IV SOLN
500.0000 [IU] | Freq: Once | INTRAVENOUS | Status: AC | PRN
  Administered 2018-08-31: 500 [IU]
  Filled 2018-08-31: qty 5

## 2018-08-31 NOTE — Patient Instructions (Signed)
[facility] Discharge Instructions for Patients Receiving Chemotherapy  Today you received the following chemotherapy agents Nivolumab (OPDIVO).  To help prevent nausea and vomiting after your treatment, we encourage you to take your nausea medication as prescribed.   If you develop nausea and vomiting that is not controlled by your nausea medication, call the clinic.   BELOW ARE SYMPTOMS THAT SHOULD BE REPORTED IMMEDIATELY:  *FEVER GREATER THAN 100.5 F  *CHILLS WITH OR WITHOUT FEVER  NAUSEA AND VOMITING THAT IS NOT CONTROLLED WITH YOUR NAUSEA MEDICATION  *UNUSUAL SHORTNESS OF BREATH  *UNUSUAL BRUISING OR BLEEDING  TENDERNESS IN MOUTH AND THROAT WITH OR WITHOUT PRESENCE OF ULCERS  *URINARY PROBLEMS  *BOWEL PROBLEMS  UNUSUAL RASH Items with * indicate a potential emergency and should be followed up as soon as possible.  Feel free to call the clinic should you have any questions or concerns. The clinic phone number is [phone].  Please show the CHEMO ALERT CARD at check-in to the Emergency Department and triage nurse.   

## 2018-08-31 NOTE — Telephone Encounter (Signed)
Printed avs and calender of upcominng appointment. Per 1/28 los

## 2018-08-31 NOTE — Progress Notes (Signed)
[place] Telephone:([phone]) [phone]   Fax:(336) [phone]  OFFICE PROGRESS NOTE  Harris, [name], [name] [address] [place] [address]  DIAGNOSIS: Metastatic non-small cell lung cancer, adenocarcinoma. This was initially diagnosed as stage IB (T2a, N0, M0) in March of 2014, status post right upper lobectomy with lymph node dissection but the patient has evidence for disease recurrence in the right axilla status post resection.  PRIOR THERAPY:  1) Status post right axillary lymph node biopsy on 05/01/2014 under the care of Dr. Roxan Hockey. 2) Systemic chemotherapy with carboplatin for AUC of 5 and Alimta 500 mg/M2 every 3 weeks. First cycle 05/31/2014. Status post 6 cycles. Last dose was given 09/22/2014. 3) palliative radiotherapy to the subcarinal lymph node under the care of Dr. Sondra Come. 4) Nivolumab 240 mg IV every 2 weeks. First dose 12/23/2016. Status post 6 cycles.  CURRENT THERAPY:  Nivolumab 480 mg IV every 4 weeks. First dose 03/17/2017. Status post 19 cycles.  INTERVAL HISTORY: Nancy Hodges 73 y.o. female returns to the clinic today for follow-up visit.  The patient is feeling fine today with no concerning complaints.  She denied having any chest pain, shortness of breath, cough or hemoptysis.  She denied having any fever or chills.  She has no nausea, vomiting, diarrhea or constipation.  She denied having any headache or visual changes.  She has no recent weight loss or night sweats.  The patient had repeat CT scan of the chest, abdomen and pelvis performed recently and she is here for evaluation and discussion of her scan results.  MEDICAL HISTORY: Past Medical History:  Diagnosis Date  . Allergic rhinitis   . Anxiety   . Arthritis   . Asthma   . Atrial flutter ([place])   . Cholelithiases 09/26/2015  . Diverticulosis   . Encounter for antineoplastic immunotherapy 12/09/2016  . GERD (gastroesophageal reflux disease)   . H/O hiatal hernia   .  History of blood transfusion   . History of chemotherapy   . History of kidney stones   . History of radiation therapy   . Internal hemorrhoid   . Lung cancer ([place])    a. Stage IB non-small cell carcinoma, s/p R VATS, wedge resection, RU lobectomy 10/2012.  . Paroxysmal atrial fibrillation ([facility])    a. anticoagulated with Xarelto  . Pneumonia 06/2016   [facility]  . Presence of permanent cardiac pacemaker   . Pulmonary nodule   . Tachycardia-bradycardia syndrome ([place])    a. 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    ALLERGIES:  is allergic to chlorpheniramine-dm; prednisone; septra [sulfamethoxazole-trimethoprim]; vioxx [rofecoxib]; and penicillins.  MEDICATIONS:  Current Outpatient Medications  Medication Sig Dispense Refill  . acetaminophen (TYLENOL) 500 MG tablet Take 500 mg by mouth 3 (three) times daily as needed for moderate pain or fever.     [name] albuterol (PROVENTIL HFA;VENTOLIN HFA) 108 (90 BASE) MCG/ACT inhaler Inhale 2 puffs into the lungs every 6 (six) hours as needed for wheezing or shortness of breath.     [name] azelastine (ASTELIN) 0.1 % nasal spray Place 1 spray into both nostrils 2 (two) times daily. Use in each nostril as directed    . calcium carbonate (TUMS - DOSED IN MG ELEMENTAL CALCIUM) 500 MG chewable tablet Chew 1 tablet by mouth 2 (two) times daily as needed for indigestion or heartburn.     [name] CARTIA XT 240 MG 24 hr capsule TAKE 1 CAPSULE(240 MG) BY MOUTH DAILY (Patient  taking differently: Take 240 mg by mouth daily. ) 90 capsule 3  . dimenhyDRINATE (DRAMAMINE) 50 MG tablet Take 12.5 mg by mouth every 8 (eight) hours as needed for dizziness.    [name] ipratropium-albuterol (DUONEB) 0.5-2.5 (3) MG/3ML SOLN Take 3 mLs by nebulization every 6 (six) hours as needed (shortness of breath or wheezing).     [name] lidocaine-prilocaine (EMLA) cream Apply 1 application topically as needed. (Patient taking differently: Apply 1 application topically as needed (for port). ) 30 g 4  .  loratadine (CLARITIN) 10 MG tablet Take 10 mg by mouth daily.    . metoprolol tartrate (LOPRESSOR) 100 MG tablet Take 1 tablet (100 mg total) by mouth once for 1 dose. Take one tablet 2 hours prior to cardiac CT 1 tablet 0  . ondansetron (ZOFRAN-ODT) 4 MG disintegrating tablet Take 4 mg by mouth every 8 (eight) hours as needed for nausea or vomiting (may take 1-2 tabs prn nausea). "for recent stomach virus"    . OVER THE COUNTER MEDICATION Take 5 mLs by mouth daily as needed (cough). [employer] kids cough syrup    . Polyvinyl Alcohol-Povidone (REFRESH OP) Place 1 drop into both eyes daily as needed (dry eyes).    [name] tablet TAKE 1 TABLET(20 MG) BY MOUTH DAILY WITH SUPPER (Patient taking differently: Take 20 mg by mouth daily with supper. ) 30 tablet 5   No current facility-administered medications for this visit.    Facility-Administered Medications Ordered in Other Visits  Medication Dose Route Frequency Provider Last Rate Last Dose  . sodium chloride flush (NS) 0.9 % injection 10 mL  10 mL Intravenous PRN Curt Bears, MD   10 mL at 08/31/18 0900    SURGICAL HISTORY:  Past Surgical History:  Procedure Laterality Date  . CARDIOVERSION N/A 02/19/2018   Procedure: CARDIOVERSION;  Surgeon: Sanda Klein, MD;  Location: [facility];  Service: Cardiovascular;  Laterality: N/A;  . CARDIOVERSION N/A 04/30/2018   Procedure: CARDIOVERSION;  Surgeon: Thayer Headings, MD;  Location: [facility] ENDOSCOPY;  Service: Cardiovascular;  Laterality: N/A;  . CATARACT EXTRACTION W/PHACO Right 08/14/2016   Procedure: CATARACT EXTRACTION PHACO AND INTRAOCULAR LENS PLACEMENT RIGHT EYE CDE 10.53;  Surgeon: Tonny Branch, MD;  Location: [facility];  Service: Ophthalmology;  Laterality: Right;  right  . CATARACT EXTRACTION W/PHACO Left 09/25/2016   Procedure: CATARACT EXTRACTION PHACO AND INTRAOCULAR LENS PLACEMENT (IOC);  Surgeon: Tonny Branch, MD;  Location: [facility];  Service: Ophthalmology;  Laterality: Left;   left CDE 8.81  . COLONOSCOPY     [facility]: cattered sigmoid diverticula, internal hemorrhoids, sessile polyp in ascending and mid transverse colon (tubular adenoma).   . colonoscopy with polypectomy  2015  . ESOPHAGOGASTRODUODENOSCOPY     [facility]: bile reflux in stomach, mild gastritis, hiatal hernia  . ESOPHAGOGASTRODUODENOSCOPY N/A 01/31/2016   Procedure: ESOPHAGOGASTRODUODENOSCOPY (EGD);  Surgeon: [name], MD;  [facility]: [facility];  Service: Endoscopy;  Laterality: N/A;  1200  . IR FLUORO GUIDE PORT INSERTION RIGHT  06/30/2017  . IR US GUIDE VASC ACCESS RIGHT  06/30/2017  . LOBECTOMY Right 10/27/2012   Procedure: LOBECTOMY;  Surgeon: [name], MD;  Location: [facility];  Service: Thoracic;  Laterality: Right;   RIGHT UPPER LOBECTOMY & Node Dissection  . LYMPH NODE BIOPSY Right 05/01/2014   Procedure: LYMPH NODE BIOPSY, Right Axillary;  Surgeon: [name], MD;  Location: [facility];  Service: Thoracic;  Laterality: Right;  . PACEMAKER IMPLANT N/A 10/30/2017  SJM Assurity MRI PPM implanted by Dr [name] once leadless pacemaker battery depleted  . PARTIAL HYSTERECTOMY    . PERMANENT PACEMAKER INSERTION N/A 09/14/2012   Nanostim (SJM) leadless pacemaker (LEADLESS II STUDY PATIENT) implanted by Dr [name], no longer functioning, device abandoned.  [name] VIDEO ASSISTED THORACOSCOPY (VATS)/WEDGE RESECTION Right 10/27/2012   Procedure: VIDEO ASSISTED THORACOSCOPY (VATS),RGHT UPPER LOBE LUNG WEDGE RESECTION;  Surgeon: [name], MD;  Location: [facility];  Service: Thoracic;  Laterality: Right;    REVIEW OF SYSTEMS:  Constitutional: negative Eyes: negative Ears, nose, mouth, throat, and face: negative Respiratory: positive for dyspnea on exertion Cardiovascular: negative Gastrointestinal: negative Genitourinary:negative Integument/breast: negative Hematologic/lymphatic: negative Musculoskeletal:negative Neurological: negative Behavioral/Psych: negative Endocrine:  negative Allergic/Immunologic: negative   PHYSICAL EXAMINATION: General appearance: alert, cooperative and no distress Head: Normocephalic, without obvious abnormality, atraumatic Neck: no adenopathy, no JVD, supple, symmetrical, trachea midline and thyroid not enlarged, symmetric, no tenderness/mass/nodules Lymph nodes: Cervical, supraclavicular, and axillary nodes normal. Resp: clear to auscultation bilaterally Back: symmetric, no curvature. ROM normal. No CVA tenderness. Cardio: regular rate and rhythm, S1, S2 normal, no murmur, click, rub or gallop GI: soft, non-tender; bowel sounds normal; no masses,  no organomegaly Extremities: extremities normal, atraumatic, no cyanosis or edema Neurologic: Alert and oriented X 3, normal strength and tone. Normal symmetric reflexes. Normal coordination and gait  ECOG PERFORMANCE STATUS: 1 - Symptomatic but completely ambulatory  Blood pressure 138/77, pulse 72, temperature 97.8 F (36.6 C), temperature source Oral, resp. rate 18, height 5\' 2"  (1.575 m), weight 150 lb 6.4 oz (68.2 kg), SpO2 98 %.  LABORATORY DATA: Lab Results  Component Value Date   WBC 4.2 08/03/2018   HGB 12.9 08/03/2018   HCT 37.8 08/03/2018   MCV 93.3 08/03/2018   PLT 151 08/03/2018      Chemistry      Component Value Date/Time   NA 143 08/03/2018 0935   NA 139 08/05/2017 0902   K 3.8 08/03/2018 0935   K 4.1 08/05/2017 0902   CL 108 08/03/2018 0935   CO2 26 08/03/2018 0935   CO2 28 08/05/2017 0902   BUN 5 (L) 08/03/2018 0935   BUN 10.1 08/05/2017 0902   CREATININE 0.70 08/03/2018 0935   CREATININE 0.8 08/05/2017 0902   GLU 134 (H) 02/18/2017 1410      Component Value Date/Time   CALCIUM 9.3 08/03/2018 0935   CALCIUM 9.5 08/05/2017 0902   ALKPHOS 108 08/03/2018 0935   ALKPHOS 111 08/05/2017 0902   AST 40 08/03/2018 0935   AST 33 08/05/2017 0902   ALT 31 08/03/2018 0935   ALT 34 08/05/2017 0902   BILITOT 1.3 (H) 08/03/2018 0935   BILITOT 1.39 (H)  08/05/2017 0902       RADIOGRAPHIC STUDIES: Ct Chest W Contrast  Result Date: 08/27/2018 CLINICAL DATA:  Lung cancer diagnosed in 2014 with chemotherapy ongoing and radiation therapy completed. EXAM: CT CHEST, ABDOMEN, AND PELVIS WITH CONTRAST TECHNIQUE: Multidetector CT imaging of the chest, abdomen and pelvis was performed following the standard protocol during bolus administration of intravenous contrast. CONTRAST:  147mL OMNIPAQUE IOHEXOL 300 MG/ML  SOLN COMPARISON:  05/06/2018. FINDINGS: CT CHEST FINDINGS Cardiovascular: Right Port-A-Cath tip high right atrium. Dual lead pacer. Aortic and branch vessel atherosclerosis. Normal heart size, without pericardial effusion. Multivessel coronary artery atherosclerosis. No central pulmonary embolism, on this non-dedicated study. Mediastinum/Nodes: No supraclavicular adenopathy. Small right supraclavicular/low jugular node is unchanged. No mediastinal or hilar adenopathy. Tiny hiatal hernia. Lungs/Pleura: No pleural fluid. Secretions in  the dependent trachea. Right upper lobectomy. Moderate centrilobular emphysema. Right lower lobe paramediastinal fibrosis is presumably radiation induced. Left apical ground-glass nodule measures 9 mm on image 28/4 versus 8 mm on the prior exam (when remeasured). Example image 28/4. Minimal motion degradation inferiorly. Clustered nodularity within the lingula is similar including on image 60/4, likely post infectious or inflammatory. Musculoskeletal: No acute osseous abnormality. CT ABDOMEN PELVIS FINDINGS Hepatobiliary: Advanced cirrhosis. Heterogeneous enhancement within the hepatic dome is less distinct today, including on image 46/2. No suspicious liver lesion. Multiple small gallstones without acute cholecystitis. Pancreas: Normal, without mass or ductal dilatation. Spleen: A low-density splenic lesion is well-circumscribed, similar in size at 1.0 cm. Of doubtful clinical significance. Adrenals/Urinary Tract: Normal adrenal  glands. Mildly scarred kidneys bilaterally. No hydronephrosis. Normal urinary bladder. Stomach/Bowel: Normal remainder of the stomach. Third portion duodenal diverticulum. Otherwise normal small bowel. Scattered colonic diverticula. Normal terminal ileum and appendix. Vascular/Lymphatic: Advanced aortic and branch vessel atherosclerosis. Prominent porta hepatis nodes are unchanged and likely related to cirrhosis. Recannulized paraumbilical vein indicative of portal venous hypertension. Patent splenic and portal veins. Reproductive: Hysterectomy.  No adnexal mass. Other: No significant free fluid. No evidence of omental or peritoneal disease. Musculoskeletal: Mild disc bulge at the lumbosacral junction. IMPRESSION: 1. Status post right upper lobectomy. No findings of recurrent or metastatic disease. 2. Ground-glass nodule in the left apex may be minimally enlarged. This warrants follow-up attention. 3. Cirrhosis and portal venous hypertension, without evidence of hepatocellular carcinoma. Vague hyperenhancement within the hepatic dome is less distinct today and may simply represent a perfusion anomaly. 4. Aortic atherosclerosis (ICD10-I70.0), coronary artery atherosclerosis and emphysema (ICD10-J43.9). 5.  Tiny hiatal hernia. 6. Cholelithiasis. Electronically Signed   By: Abigail Miyamoto M.D.   On: 08/27/2018 11:43   Ct Abdomen Pelvis W Contrast  Result Date: 08/27/2018 CLINICAL DATA:  Lung cancer diagnosed in 2014 with chemotherapy ongoing and radiation therapy completed. EXAM: CT CHEST, ABDOMEN, AND PELVIS WITH CONTRAST TECHNIQUE: Multidetector CT imaging of the chest, abdomen and pelvis was performed following the standard protocol during bolus administration of intravenous contrast. CONTRAST:  154mL OMNIPAQUE IOHEXOL 300 MG/ML  SOLN COMPARISON:  05/06/2018. FINDINGS: CT CHEST FINDINGS Cardiovascular: Right Port-A-Cath tip high right atrium. Dual lead pacer. Aortic and branch vessel atherosclerosis. Normal heart  size, without pericardial effusion. Multivessel coronary artery atherosclerosis. No central pulmonary embolism, on this non-dedicated study. Mediastinum/Nodes: No supraclavicular adenopathy. Small right supraclavicular/low jugular node is unchanged. No mediastinal or hilar adenopathy. Tiny hiatal hernia. Lungs/Pleura: No pleural fluid. Secretions in the dependent trachea. Right upper lobectomy. Moderate centrilobular emphysema. Right lower lobe paramediastinal fibrosis is presumably radiation induced. Left apical ground-glass nodule measures 9 mm on image 28/4 versus 8 mm on the prior exam (when remeasured). Example image 28/4. Minimal motion degradation inferiorly. Clustered nodularity within the lingula is similar including on image 60/4, likely post infectious or inflammatory. Musculoskeletal: No acute osseous abnormality. CT ABDOMEN PELVIS FINDINGS Hepatobiliary: Advanced cirrhosis. Heterogeneous enhancement within the hepatic dome is less distinct today, including on image 46/2. No suspicious liver lesion. Multiple small gallstones without acute cholecystitis. Pancreas: Normal, without mass or ductal dilatation. Spleen: A low-density splenic lesion is well-circumscribed, similar in size at 1.0 cm. Of doubtful clinical significance. Adrenals/Urinary Tract: Normal adrenal glands. Mildly scarred kidneys bilaterally. No hydronephrosis. Normal urinary bladder. Stomach/Bowel: Normal remainder of the stomach. Third portion duodenal diverticulum. Otherwise normal small bowel. Scattered colonic diverticula. Normal terminal ileum and appendix. Vascular/Lymphatic: Advanced aortic and branch vessel atherosclerosis. Prominent porta hepatis nodes  are unchanged and likely related to cirrhosis. Recannulized paraumbilical vein indicative of portal venous hypertension. Patent splenic and portal veins. Reproductive: Hysterectomy.  No adnexal mass. Other: No significant free fluid. No evidence of omental or peritoneal disease.  Musculoskeletal: Mild disc bulge at the lumbosacral junction. IMPRESSION: 1. Status post right upper lobectomy. No findings of recurrent or metastatic disease. 2. Ground-glass nodule in the left apex may be minimally enlarged. This warrants follow-up attention. 3. Cirrhosis and portal venous hypertension, without evidence of hepatocellular carcinoma. Vague hyperenhancement within the hepatic dome is less distinct today and may simply represent a perfusion anomaly. 4. Aortic atherosclerosis (ICD10-I70.0), coronary artery atherosclerosis and emphysema (ICD10-J43.9). 5.  Tiny hiatal hernia. 6. Cholelithiasis. Electronically Signed   By: Abigail Miyamoto M.D.   On: 08/27/2018 11:43   ASSESSMENT AND PLAN:  This is a very pleasant 73 years old white female with metastatic non-small cell lung cancer, adenocarcinoma with no actionable mutations status post systemic chemotherapy with carboplatin and Alimta and she is currently undergoing treatment with second line immunotherapy with Nivolumab status post 6 cycles. This was followed by immunotherapy with Nivolumab 480 mg IV every 4 weeks status post 19 cycles. The patient continues to tolerate this treatment well with no concerning adverse effects. She had repeat CT scan of the chest, abdomen and pelvis performed recently.  I personally and independently reviewed the scan and discussed the results with the patient today. Her scan showed no concerning findings for disease progression. I recommended for the patient to continue her current treatment with nivolumab every 4 weeks and she will proceed with cycle #20 today. For the cardiac arrhythmia she is followed by cardiology and expected to have intervention in March. The patient will continue her current medication for hypertension. The patient voices understanding of current disease status and treatment options and is in agreement with the current care plan. All questions were answered. The patient knows to call the  clinic with any problems, questions or concerns. We can certainly see the patient much sooner if necessary.  Disclaimer: This note was dictated with voice recognition software. Similar sounding words can inadvertently be transcribed and may not be corrected upon review.

## 2018-09-04 ENCOUNTER — Other Ambulatory Visit: Payer: Self-pay

## 2018-09-04 ENCOUNTER — Emergency Department (HOSPITAL_COMMUNITY): Payer: Medicare Other

## 2018-09-04 ENCOUNTER — Emergency Department (HOSPITAL_COMMUNITY)
Admission: EM | Admit: 2018-09-04 | Discharge: 2018-09-04 | Disposition: A | Discharge status: Home / Self Care | Payer: Medicare Other | Attending: Emergency Medicine | Admitting: Emergency Medicine

## 2018-09-04 ENCOUNTER — Encounter (HOSPITAL_COMMUNITY): Payer: Self-pay | Admitting: Emergency Medicine

## 2018-09-04 DIAGNOSIS — J45909 Unspecified asthma, uncomplicated: Secondary | ICD-10-CM | POA: Insufficient documentation

## 2018-09-04 DIAGNOSIS — R0981 Nasal congestion: Secondary | ICD-10-CM

## 2018-09-04 DIAGNOSIS — Z79899 Other long term (current) drug therapy: Secondary | ICD-10-CM | POA: Diagnosis not present

## 2018-09-04 DIAGNOSIS — R531 Weakness: Secondary | ICD-10-CM | POA: Diagnosis not present

## 2018-09-04 DIAGNOSIS — C3490 Malignant neoplasm of unspecified part of unspecified bronchus or lung: Secondary | ICD-10-CM | POA: Diagnosis not present

## 2018-09-04 DIAGNOSIS — Z87891 Personal history of nicotine dependence: Secondary | ICD-10-CM | POA: Insufficient documentation

## 2018-09-04 DIAGNOSIS — R112 Nausea with vomiting, unspecified: Secondary | ICD-10-CM | POA: Diagnosis present

## 2018-09-04 LAB — URINALYSIS, ROUTINE W REFLEX MICROSCOPIC
Bacteria, UA: NONE SEEN
Bilirubin Urine: NEGATIVE
Glucose, UA: NEGATIVE mg/dL
Hgb urine dipstick: NEGATIVE
KETONES UR: NEGATIVE mg/dL
Leukocytes, UA: NEGATIVE
Nitrite: NEGATIVE
Protein, ur: NEGATIVE mg/dL
Specific Gravity, Urine: 1.008 (ref 1.005–1.030)
pH: 6 (ref 5.0–8.0)

## 2018-09-04 LAB — COMPREHENSIVE METABOLIC PANEL
ALT: 38 U/L (ref 0–44)
AST: 43 U/L — ABNORMAL HIGH (ref 15–41)
Albumin: 4.1 g/dL (ref 3.5–5.0)
Alkaline Phosphatase: 109 U/L (ref 38–126)
Anion gap: 7 (ref 5–15)
BUN: 11 mg/dL (ref 8–23)
CO2: 26 mmol/L (ref 22–32)
Calcium: 9.3 mg/dL (ref 8.9–10.3)
Chloride: 105 mmol/L (ref 98–111)
Creatinine, Ser: 0.45 mg/dL (ref 0.44–1.00)
GFR calc Af Amer: >60 mL/min (ref >60)
GFR calc non Af Amer: >60 mL/min (ref >60)
GLUCOSE: 119 mg/dL — AB (ref 70–99)
Potassium: 3.8 mmol/L (ref 3.5–5.1)
SODIUM: 138 mmol/L (ref 135–145)
Total Bilirubin: 1 mg/dL (ref 0.3–1.2)
Total Protein: 7 g/dL (ref 6.5–8.1)

## 2018-09-04 LAB — LACTIC ACID, PLASMA
Lactic Acid, Venous: 1.2 mmol/L (ref 0.5–1.9)
Lactic Acid, Venous: 1.1 mmol/L (ref 0.5–1.9)

## 2018-09-04 LAB — INFLUENZA PANEL BY PCR (TYPE A & B)
Influenza A By PCR: NEGATIVE
Influenza B By PCR: NEGATIVE

## 2018-09-04 LAB — CBC WITH DIFFERENTIAL/PLATELET
Abs Immature Granulocytes: 0.01 10*3/uL (ref 0.00–0.07)
BASOS ABS: 0 10*3/uL (ref 0.0–0.1)
Basophils Relative: 1 %
EOS PCT: 0 %
Eosinophils Absolute: 0 10*3/uL (ref 0.0–0.5)
HCT: 39.6 % (ref 36.0–46.0)
HEMOGLOBIN: 13.2 g/dL (ref 12.0–15.0)
Immature Granulocytes: 0 %
LYMPHS PCT: 17 %
Lymphs Abs: 1.3 10*3/uL (ref 0.7–4.0)
MCH: 31.7 pg (ref 26.0–34.0)
MCHC: 33.3 g/dL (ref 30.0–36.0)
MCV: 95 fL (ref 80.0–100.0)
Monocytes Absolute: 0.5 10*3/uL (ref 0.1–1.0)
Monocytes Relative: 7 %
Neutro Abs: 5.5 10*3/uL (ref 1.7–7.7)
Neutrophils Relative %: 75 %
Platelets: 208 10*3/uL (ref 150–400)
RBC: 4.17 MIL/uL (ref 3.87–5.11)
RDW: 12.8 % (ref 11.5–15.5)
WBC: 7.3 10*3/uL (ref 4.0–10.5)
nRBC: 0 % (ref 0.0–0.2)

## 2018-09-04 LAB — TROPONIN I: Troponin I: <0.03 ng/mL (ref <0.03)

## 2018-09-04 LAB — LIPASE, BLOOD: Lipase: 44 U/L (ref 11–51)

## 2018-09-04 MED ORDER — SODIUM CHLORIDE 0.9 % IV BOLUS
1000.0000 mL | Freq: Once | INTRAVENOUS | Status: AC
  Administered 2018-09-04: 1000 mL via INTRAVENOUS

## 2018-09-04 MED ORDER — ONDANSETRON 4 MG PO TBDP: 4.0000 mg | ORAL_TABLET | Freq: Three times a day (TID) | ORAL | 0 refills | Status: DC | PRN

## 2018-09-04 MED ORDER — ONDANSETRON HCL 4 MG/2ML IJ SOLN
4.0000 mg | Freq: Once | INTRAMUSCULAR | Status: AC
  Administered 2018-09-04: 4 mg via INTRAVENOUS
  Filled 2018-09-04: qty 2

## 2018-09-04 MED ORDER — HEPARIN SOD (PORK) LOCK FLUSH 100 UNIT/ML IV SOLN
500.0000 [IU] | Freq: Once | INTRAVENOUS | Status: AC
  Administered 2018-09-04: 500 [IU]
  Filled 2018-09-04: qty 5

## 2018-09-04 NOTE — ED Triage Notes (Addendum)
Pt reports 3 episodes of vomiting, nausea, sinus congestion and pain, and HA that began yesterday. Pt also endorses generalized weakness and palpitations. Hx of Afib. Denies fever. Pt currently receiving chemo for lung cancer.

## 2018-09-04 NOTE — ED Provider Notes (Signed)
Emergency Department Provider Note   I have reviewed the triage vital signs and the nursing notes.   HISTORY  Chief Complaint Emesis   HPI Nancy Hodges is a 73 y.o. female with PMH of anxiety, GERD, non-small cell lung cancer currently on monthly Nivolumab 480 mg Q4wks, presents to the emergency department with generalized fatigue, nausea, vomiting, head/face pressure, intermittent vertigo, and mild URI symptoms.  Patient states she began feeling badly yesterday. She has experienced some palpitations but denies any CP. She is unsure regarding fevers.  She is not experiencing any severe abdominal pain or back pain.  No UTI symptoms.  She has had increasing nausea and vomiting today.  She tried taking Zofran ODT with little relief in symptoms.  No sick contacts at home.  Her last chemotherapy infusion was on 08/31/18 and she follows with Dr. Julien Nordmann. No radiation of symptoms or other modifying factors.    Past Medical History:  Diagnosis Date  . Allergic rhinitis   . Anxiety   . Arthritis   . Asthma   . Atrial flutter ([place])   . Cholelithiases 09/26/2015  . Diverticulosis   . Encounter for antineoplastic immunotherapy 12/09/2016  . GERD (gastroesophageal reflux disease)   . H/O hiatal hernia   . History of blood transfusion   . History of chemotherapy   . History of kidney stones   . History of radiation therapy   . Internal hemorrhoid   . Lung cancer ([place])    a. Stage IB non-small cell carcinoma, s/p R VATS, wedge resection, RU lobectomy 10/2012.  . Paroxysmal atrial fibrillation ([facility])    a. anticoagulated with Xarelto  . Pneumonia 06/2016   [facility]  . Presence of permanent cardiac pacemaker   . Pulmonary nodule   . Tachycardia-bradycardia syndrome ([place])    a. 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    Patient Active Problem List   Diagnosis Date Noted  . Persistent atrial fibrillation   . Sick sinus syndrome ([place]) 10/30/2017  . Port-A-Cath in place  07/07/2017  . Seborrheic keratosis 05/12/2017  . Nausea and vomiting 02/19/2017  . Weakness 02/19/2017  . SIRS (systemic inflammatory response syndrome) ([place]) 02/19/2017  . Goals of care, counseling/discussion 12/09/2016  . Encounter for antineoplastic immunotherapy 12/09/2016  . Esophageal varices in other disease   . Hepatic cirrhosis ([place]) 01/09/2016  . Elevated LFTs 11/07/2015  . Cholelithiases 09/26/2015  . Melena   . GI bleed 07/29/2014  . Lung cancer, primary, with metastasis from lung to other site [facility]) 05/05/2014  . Arthralgia 10/05/2013  . Obstructive chronic bronchitis without exacerbation Gold A  02/11/2013  . Non-small cell carcinoma of right lung, stage 4 ([place]) 09/29/2012  . Tachycardia-bradycardia syndrome ([place]) 09/06/2012  . Paroxysmal atrial fibrillation [facility])     Past Surgical History:  Procedure Laterality Date  . CARDIOVERSION N/A 02/19/2018   Procedure: CARDIOVERSION;  Surgeon: Sanda Klein, MD;  Location: [facility] ENDOSCOPY;  Service: Cardiovascular;  Laterality: N/A;  . CARDIOVERSION N/A 04/30/2018   Procedure: CARDIOVERSION;  Surgeon: Thayer Headings, MD;  Location: [facility] ENDOSCOPY;  Service: Cardiovascular;  Laterality: N/A;  . CATARACT EXTRACTION W/PHACO Right 08/14/2016   Procedure: CATARACT EXTRACTION PHACO AND INTRAOCULAR LENS PLACEMENT RIGHT EYE CDE 10.53;  Surgeon: Tonny Branch, MD;  Location: [facility] ORS;  Service: Ophthalmology;  Laterality: Right;  right  . CATARACT EXTRACTION W/PHACO Left 09/25/2016   Procedure: CATARACT EXTRACTION PHACO AND INTRAOCULAR LENS PLACEMENT (IOC);  Surgeon: Tonny Branch, MD;  Location: [facility] ORS;  Service:  Ophthalmology;  Laterality: Left;  left CDE 8.81  . COLONOSCOPY     [facility]: cattered sigmoid diverticula, internal hemorrhoids, sessile polyp in ascending and mid transverse colon (tubular adenoma).   . colonoscopy with polypectomy  2015  . ESOPHAGOGASTRODUODENOSCOPY     [facility]: bile reflux in stomach, mild gastritis, hiatal hernia    . ESOPHAGOGASTRODUODENOSCOPY N/A 01/31/2016   Procedure: ESOPHAGOGASTRODUODENOSCOPY (EGD);  Surgeon: [name], MD;  Location[facility] END[facility];  Service: Endoscopy;  Laterality: N/A;  1200  . IR FLUORO GUIDE PORT INSERTION RIGHT  06/30/2017  . IR US GUIDE VASC ACCESS RIGHT  06/30/2017  . LOBECTOMY Right 10/27/2012   Procedure: LOBECTOMY;  Surgeon: [name], MD;  Location: [place];  Service: Thoracic;  Laterality: Right;   RIGHT UPPER LOBECTOMY & Node Dissection  . LYMPH NODE BIOPSY Right 05/01/2014   Procedure: LYMPH NODE BIOPSY, Right Axillary;  Surgeon: [name], MD;  Location: [place];  Service: Thoracic;  Laterality: Right;  . PACEMAKER IMPLANT N/A 10/30/2017   SJM Assurity MRI PPM implanted by Dr [name] once leadless pacemaker battery depleted  . PARTIAL HYSTERECTOMY    . PERMANENT PACEMAKER INSERTION N/A 09/14/2012   Nanostim (SJM) leadless pacemaker (LEADLESS II STUDY PATIENT) implanted by Dr [name], no longer functioning, device abandoned.  [name] VIDEO ASSISTED THORACOSCOPY (VATS)/WEDGE RESECTION Right 10/27/2012   Procedure: VIDEO ASSISTED THORACOSCOPY (VATS),RGHT UPPER LOBE LUNG WEDGE RESECTION;  Surgeon: [name], MD;  Location: [place];  Service: Thoracic;  Laterality: Right;    Allergies Chlorpheniramine-dm; Prednisone; Septra [sulfamethoxazole-trimethoprim]; Vioxx [rofecoxib]; and Penicillins  Family History  Problem Relation Age of Onset  . Heart disease Mother   . Diabetes Mother   . Kidney cancer Mother   . Asthma Father   . Heart disease Father   . Pancreatic cancer Brother   . Diabetes Sister   . Lung cancer Sister   . Diabetes Brother   . Heart attack Son   . Colon cancer Neg Hx     Social History Social History   Tobacco Use  . Smoking status: Former Smoker    Packs/day: 2.00    Years: 40.00    Pack years: 80.00    Types: Cigarettes    Last attempt to quit: 08/05/1995    Years since quitting: 23.1  . Smokeless tobacco: Never  Used  Substance Use Topics  . Alcohol use: No  . Drug use: No    Review of Systems  Constitutional: No fever/chills. Positive fatigue.  Eyes: No visual changes. ENT: No sore throat. Positive intermittent vertigo. Positive rhinorrhea.  Cardiovascular: Denies chest pain. Respiratory: Denies shortness of breath. Positive cough.  Gastrointestinal: No abdominal pain. Positive nausea and vomiting.  No diarrhea.  No constipation. Genitourinary: Negative for dysuria. Musculoskeletal: Negative for back pain. Skin: Negative for rash. Neurological: Negative for focal weakness or numbness. Positive HA.   10-point ROS otherwise negative.  ____________________________________________   PHYSICAL EXAM:  VITAL SIGNS: ED Triage Vitals [09/04/18 1521]  Enc Vitals Group     BP (!) 159/70     Pulse Rate 89     Resp 16     Temp 98.1 F (36.7 C)     Temp Source Oral     SpO2 99 %     Weight 150 lb (68 kg)     Height 5\' 2"  (1.575 m)     Pain Score 5   Constitutional: Alert and oriented. Well appearing and in no acute distress. Eyes: Conjunctivae  are normal. Head: Atraumatic. Nose: No congestion/rhinnorhea. Mouth/Throat: Mucous membranes are dry.  Neck: No stridor. Cardiovascular: Normal rate, regular rhythm. Good peripheral circulation. Grossly normal heart sounds.   Respiratory: Normal respiratory effort.  No retractions. Lungs CTAB. Gastrointestinal: Soft and nontender. No distention.  Musculoskeletal: No lower extremity tenderness nor edema. No gross deformities of extremities. Neurologic:  Normal speech and language. No gross focal neurologic deficits are appreciated.  Skin:  Skin is warm, dry and intact. No rash noted.  ____________________________________________   LABS (all labs ordered are listed, but only abnormal results are displayed)  Labs Reviewed  COMPREHENSIVE METABOLIC PANEL - Abnormal; Notable for the following components:      Result Value   Glucose, Bld 119 (*)     AST 43 (*)    All other components within normal limits  URINALYSIS, ROUTINE W REFLEX MICROSCOPIC - Abnormal; Notable for the following components:   Color, Urine STRAW (*)    All other components within normal limits  CULTURE, BLOOD (ROUTINE X 2)  CULTURE, BLOOD (ROUTINE X 2)  URINE CULTURE  LIPASE, BLOOD  TROPONIN I  LACTIC ACID, PLASMA  LACTIC ACID, PLASMA  CBC WITH DIFFERENTIAL/PLATELET  INFLUENZA PANEL BY PCR (TYPE A & B)   ____________________________________________  EKG   EKG Interpretation  Date/Time:  Saturday September 04 2018 15:24:51 EST Ventricular Rate:  73 PR Interval:    QRS Duration: 80 QT Interval:  358 QTC Calculation: 394 R Axis:   69 Text Interpretation:  A-fib vs flutter  with occasional ventricular-paced complexes Abnormal ECG No STEMI.  Confirmed by Nanda Quinton [phone]) on 09/04/2018 3:31:34 PM       ____________________________________________  RADIOLOGY  Dg Chest 2 View  Result Date: 09/04/2018 CLINICAL DATA:  73 year old female with cough. Nausea vomiting. Lung cancer on chemotherapy. EXAM: CHEST - 2 VIEW COMPARISON:  Restaging CT Chest, Abdomen, and Pelvis 08/27/2018, and earlier. Chest radiograph 02/26/2018. FINDINGS: Postoperative changes to the right lung with stable lung volumes and stable/normal mediastinal contours. Right chest power port remains in place and is accessed. Stable left chest cardiac pacemaker and loop recorder. Visualized tracheal air column is within normal limits. No pneumothorax, pulmonary edema, pleural effusion or confluent pulmonary opacity. No acute osseous abnormality identified. Negative visible bowel gas pattern. IMPRESSION: Stable chest.  No acute cardiopulmonary abnormality. Electronically Signed   By: Genevie Ann M.D.   On: 09/04/2018 18:19    ____________________________________________   PROCEDURES  Procedure(s) performed:   Procedures  None ____________________________________________   INITIAL  IMPRESSION / ASSESSMENT AND PLAN / ED COURSE  Pertinent labs & imaging results that were available during my care of the patient were reviewed by me and considered in my medical decision making (see chart for details).  Patient presents to the emergency department with generalized weakness, nausea/vomiting, URI symptoms, heart palpitations.  She is on chemotherapy for 4 weeks.  Afebrile here and overall well-appearing.  Abdomen is soft and diffusely nontender.  Question flulike symptoms.  Patient does have intermittent pacing on EKG with underlying flutter.  Plan for IV fluids, screening labs, chest x-ray, and flu testing.  06:00 PM Labs reviewed. Normal troponin and lactate. No concern clinically for sepsis or ACS. Patient with a soft abdomen and normal LFTs/Lipase. Plan for IVF and reassess. UA pending. Cultures drawn. Will PO challenge and reassess after UA.   UA resulted with no acute findings.  Patient is drinking fluids in the emergency department without vomiting and overall feeling well.  Discussed management of  sinus symptoms at home and with her current nasal spray.  She has had epistaxis with Flonase in the past.  I feel it is too soon to consider antibiotic treatment for her sinusitis symptoms and discussed this with her in detail.  She plans to follow with both her PCP and oncologist.  Discussed ED return precautions in detail. Patient is feeling well with plan at discharge.  ____________________________________________  FINAL CLINICAL IMPRESSION(S) / ED DIAGNOSES  Final diagnoses:  Non-intractable vomiting with nausea, unspecified vomiting type  Nasal congestion  Generalized weakness     MEDICATIONS GIVEN DURING THIS VISIT:  Medications  sodium chloride 0.9 % bolus 1,000 mL ( Intravenous Stopped 09/04/18 1752)  ondansetron (ZOFRAN) injection 4 mg (4 mg Intravenous Given 09/04/18 1648)  heparin lock flush 100 unit/mL (500 Units Intracatheter Given 09/04/18 1954)    Note:  This  document was prepared using Dragon voice recognition software and may include unintentional dictation errors.  Nanda Quinton, MD Emergency Medicine    Gerlene Glassburn, [facility], MD 09/05/18 0000

## 2018-09-04 NOTE — Discharge Instructions (Signed)
You have been seen in the Emergency Department (ED) today for nausea and vomiting.  Your work up today has not shown a clear cause for your symptoms. You have been prescribed Zofran; please use as prescribed as needed for your nausea.  Follow up with your doctor as soon as possible regarding todays emergent visit and your symptoms of nausea.   Return to the ED if you develop abdominal, bloody vomiting, bloody diarrhea, if you are unable to tolerate fluids due to vomiting, or if you develop other symptoms that concern you.

## 2018-09-06 LAB — URINE CULTURE: Culture: NO GROWTH

## 2018-09-09 LAB — CULTURE, BLOOD (ROUTINE X 2)
Culture: NO GROWTH
Culture: NO GROWTH
SPECIAL REQUESTS: ADEQUATE
Special Requests: ADEQUATE

## 2018-09-28 ENCOUNTER — Inpatient Hospital Stay: Payer: Medicare Other

## 2018-09-28 ENCOUNTER — Inpatient Hospital Stay: Payer: Medicare Other | Attending: Internal Medicine

## 2018-09-28 ENCOUNTER — Inpatient Hospital Stay (HOSPITAL_BASED_OUTPATIENT_CLINIC_OR_DEPARTMENT_OTHER): Payer: Medicare Other | Admitting: Internal Medicine

## 2018-09-28 ENCOUNTER — Telehealth: Payer: Self-pay | Admitting: Internal Medicine

## 2018-09-28 ENCOUNTER — Encounter: Payer: Self-pay | Admitting: Internal Medicine

## 2018-09-28 VITALS — BP 156/67 | HR 89 | Temp 97.9°F | Resp 18 | Ht 62.0 in | Wt 151.2 lb

## 2018-09-28 DIAGNOSIS — C3490 Malignant neoplasm of unspecified part of unspecified bronchus or lung: Secondary | ICD-10-CM

## 2018-09-28 DIAGNOSIS — Z79899 Other long term (current) drug therapy: Secondary | ICD-10-CM | POA: Diagnosis not present

## 2018-09-28 DIAGNOSIS — Z5112 Encounter for antineoplastic immunotherapy: Secondary | ICD-10-CM | POA: Diagnosis present

## 2018-09-28 DIAGNOSIS — C773 Secondary and unspecified malignant neoplasm of axilla and upper limb lymph nodes: Secondary | ICD-10-CM

## 2018-09-28 DIAGNOSIS — Z5111 Encounter for antineoplastic chemotherapy: Secondary | ICD-10-CM | POA: Insufficient documentation

## 2018-09-28 DIAGNOSIS — C3491 Malignant neoplasm of unspecified part of right bronchus or lung: Secondary | ICD-10-CM

## 2018-09-28 DIAGNOSIS — C3411 Malignant neoplasm of upper lobe, right bronchus or lung: Secondary | ICD-10-CM

## 2018-09-28 DIAGNOSIS — I480 Paroxysmal atrial fibrillation: Secondary | ICD-10-CM

## 2018-09-28 DIAGNOSIS — I498 Other specified cardiac arrhythmias: Secondary | ICD-10-CM | POA: Diagnosis not present

## 2018-09-28 DIAGNOSIS — Z95828 Presence of other vascular implants and grafts: Secondary | ICD-10-CM

## 2018-09-28 LAB — CBC WITH DIFFERENTIAL (CANCER CENTER ONLY)
Abs Immature Granulocytes: 0.02 10*3/uL (ref 0.00–0.07)
Basophils Absolute: 0 10*3/uL (ref 0.0–0.1)
Basophils Relative: 0 %
EOS PCT: 3 %
Eosinophils Absolute: 0.2 10*3/uL (ref 0.0–0.5)
HEMATOCRIT: 38.8 % (ref 36.0–46.0)
Hemoglobin: 13.2 g/dL (ref 12.0–15.0)
Immature Granulocytes: 0 %
Lymphocytes Relative: 25 %
Lymphs Abs: 1.2 10*3/uL (ref 0.7–4.0)
MCH: 32 pg (ref 26.0–34.0)
MCHC: 34 g/dL (ref 30.0–36.0)
MCV: 93.9 fL (ref 80.0–100.0)
Monocytes Absolute: 0.5 10*3/uL (ref 0.1–1.0)
Monocytes Relative: 9 %
Neutro Abs: 3.1 10*3/uL (ref 1.7–7.7)
Neutrophils Relative %: 63 %
Platelet Count: 169 10*3/uL (ref 150–400)
RBC: 4.13 MIL/uL (ref 3.87–5.11)
RDW: 12.9 % (ref 11.5–15.5)
WBC: 4.9 10*3/uL (ref 4.0–10.5)
nRBC: 0 % (ref 0.0–0.2)

## 2018-09-28 LAB — CMP (CANCER CENTER ONLY)
ALT: 37 U/L (ref 0–44)
AST: 39 U/L (ref 15–41)
Albumin: 3.9 g/dL (ref 3.5–5.0)
Alkaline Phosphatase: 124 U/L (ref 38–126)
Anion gap: 6 (ref 5–15)
BUN: 9 mg/dL (ref 8–23)
CO2: 28 mmol/L (ref 22–32)
CREATININE: 0.73 mg/dL (ref 0.44–1.00)
Calcium: 9.4 mg/dL (ref 8.9–10.3)
Chloride: 107 mmol/L (ref 98–111)
GFR, Est AFR Am: >60 mL/min (ref >60)
Glucose, Bld: 120 mg/dL — ABNORMAL HIGH (ref 70–99)
Potassium: 4.2 mmol/L (ref 3.5–5.1)
Sodium: 141 mmol/L (ref 135–145)
Total Bilirubin: 0.9 mg/dL (ref 0.3–1.2)
Total Protein: 7.2 g/dL (ref 6.5–8.1)

## 2018-09-28 LAB — TSH: TSH: 1.363 u[IU]/mL (ref 0.308–3.960)

## 2018-09-28 MED ORDER — SODIUM CHLORIDE 0.9% FLUSH
10.0000 mL | INTRAVENOUS | Status: DC | PRN
  Administered 2018-09-28: 10 mL
  Filled 2018-09-28: qty 10

## 2018-09-28 MED ORDER — SODIUM CHLORIDE 0.9 % IV SOLN
480.0000 mg | Freq: Once | INTRAVENOUS | Status: AC
  Administered 2018-09-28: 480 mg via INTRAVENOUS
  Filled 2018-09-28: qty 48

## 2018-09-28 MED ORDER — SODIUM CHLORIDE 0.9% FLUSH
10.0000 mL | INTRAVENOUS | Status: DC | PRN
  Administered 2018-09-28: 10 mL via INTRAVENOUS
  Filled 2018-09-28: qty 10

## 2018-09-28 MED ORDER — HEPARIN SOD (PORK) LOCK FLUSH 100 UNIT/ML IV SOLN
500.0000 [IU] | Freq: Once | INTRAVENOUS | Status: AC | PRN
  Administered 2018-09-28: 500 [IU]
  Filled 2018-09-28: qty 5

## 2018-09-28 MED ORDER — SODIUM CHLORIDE 0.9 % IV SOLN
Freq: Once | INTRAVENOUS | Status: AC
  Administered 2018-09-28: 11:00:00 via INTRAVENOUS
  Filled 2018-09-28: qty 250

## 2018-09-28 NOTE — Patient Instructions (Signed)
[facility] Discharge Instructions for Patients Receiving Chemotherapy  Today you received the following chemotherapy agents Nivolumab (OPDIVO).  To help prevent nausea and vomiting after your treatment, we encourage you to take your nausea medication as prescribed.   If you develop nausea and vomiting that is not controlled by your nausea medication, call the clinic.   BELOW ARE SYMPTOMS THAT SHOULD BE REPORTED IMMEDIATELY:  *FEVER GREATER THAN 100.5 F  *CHILLS WITH OR WITHOUT FEVER  NAUSEA AND VOMITING THAT IS NOT CONTROLLED WITH YOUR NAUSEA MEDICATION  *UNUSUAL SHORTNESS OF BREATH  *UNUSUAL BRUISING OR BLEEDING  TENDERNESS IN MOUTH AND THROAT WITH OR WITHOUT PRESENCE OF ULCERS  *URINARY PROBLEMS  *BOWEL PROBLEMS  UNUSUAL RASH Items with * indicate a potential emergency and should be followed up as soon as possible.  Feel free to call the clinic should you have any questions or concerns. The clinic phone number is [phone].  Please show the CHEMO ALERT CARD at check-in to the Emergency Department and triage nurse.   

## 2018-09-28 NOTE — Progress Notes (Signed)
[facility] [phone]   Fax:([phone]  OFFICE PROGRESS NOTE  Harris, [name], [name] [address] [place] [address]  DIAGNOSIS: Metastatic non-small cell lung cancer, adenocarcinoma. This was initially diagnosed as stage IB (T2a, N0, M0) in March of 2014, status post right upper lobectomy with lymph node dissection but the patient has evidence for disease recurrence in the right axilla status post resection.  PRIOR THERAPY:  1) Status post right axillary lymph node biopsy on 05/01/2014 under the care of Dr. Roxan Hockey. 2) Systemic chemotherapy with carboplatin for AUC of 5 and Alimta 500 mg/M2 every 3 weeks. First cycle 05/31/2014. Status post 6 cycles. Last dose was given 09/22/2014. 3) palliative radiotherapy to the subcarinal lymph node under the care of Dr. Sondra Come. 4) Nivolumab 240 mg IV every 2 weeks. First dose 12/23/2016. Status post 6 cycles.  CURRENT THERAPY:  Nivolumab 480 mg IV every 4 weeks. First dose 03/17/2017. Status post 20 cycles.  INTERVAL HISTORY: Nancy Hodges 73 y.o. female returns to the clinic today for follow-up visit.  The patient is feeling fine today with no concerning complaints.  She denied having any chest pain, shortness of breath, cough or hemoptysis.  She has moments of confusion but recovers quickly.  She denied having any nausea, vomiting, diarrhea or constipation.  She denied having any headache or visual changes.  She is here today for evaluation before starting cycle #21 of her treatment.  MEDICAL HISTORY: Past Medical History:  Diagnosis Date  . Allergic rhinitis   . Anxiety   . Arthritis   . Asthma   . Atrial flutter ([place])   . Cholelithiases 09/26/2015  . Diverticulosis   . Encounter for antineoplastic immunotherapy 12/09/2016  . GERD (gastroesophageal reflux disease)   . H/O hiatal hernia   . History of blood transfusion   . History of chemotherapy   . History of kidney stones   . History  of radiation therapy   . Internal hemorrhoid   . Lung cancer ([place])    a. Stage IB non-small cell carcinoma, s/p R VATS, wedge resection, RU lobectomy 10/2012.  . Paroxysmal atrial fibrillation (HCC)    a. anticoagulated with Xarelto  . Pneumonia 06/2016   [facility]  . Presence of permanent cardiac pacemaker   . Pulmonary nodule   . Tachycardia-bradycardia syndrome ([place])    a. 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    ALLERGIES:  is allergic to chlorpheniramine-dm; prednisone; septra [sulfamethoxazole-trimethoprim]; vioxx [rofecoxib]; and penicillins.  MEDICATIONS:  Current Outpatient Medications  Medication Sig Dispense Refill  . acetaminophen (TYLENOL) 500 MG tablet Take 500 mg by mouth 3 (three) times daily as needed for moderate pain or fever.     [name] albuterol (PROVENTIL HFA;VENTOLIN HFA) 108 (90 BASE) MCG/ACT inhaler Inhale 2 puffs into the lungs every 6 (six) hours as needed for wheezing or shortness of breath.     [name] azelastine (ASTELIN) 0.1 % nasal spray Place 1 spray into both nostrils 2 (two) times daily. Use in each nostril as directed    . calcium carbonate (TUMS - DOSED IN MG ELEMENTAL CALCIUM) 500 MG chewable tablet Chew 1 tablet by mouth 2 (two) times daily as needed for indigestion or heartburn.     [name] CARTIA XT 240 MG 24 hr capsule TAKE 1 CAPSULE(240 MG) BY MOUTH DAILY (Patient taking differently: Take 240 mg by mouth daily. ) 90 capsule 3  . dimenhyDRINATE (DRAMAMINE) 50 MG tablet Take 12.5  mg by mouth every 8 (eight) hours as needed for dizziness.    [name] ipratropium-albuterol (DUONEB) 0.5-2.5 (3) MG/3ML SOLN Take 3 mLs by nebulization every 6 (six) hours as needed (shortness of breath or wheezing).     [name] lidocaine-prilocaine (EMLA) cream Apply 1 application topically as needed. (Patient taking differently: Apply 1 application topically as needed (for port). ) 30 g 4  . loratadine (CLARITIN) 10 MG tablet Take 10 mg by mouth daily.    . metoprolol tartrate (LOPRESSOR)  100 MG tablet Take 1 tablet (100 mg total) by mouth once for 1 dose. Take one tablet 2 hours prior to cardiac CT 1 tablet 0  . ondansetron (ZOFRAN ODT) 4 MG disintegrating tablet Take 1 tablet (4 mg total) by mouth every 8 (eight) hours as needed for nausea or vomiting. 20 tablet 0  . OVER THE COUNTER MEDICATION Take 5 mLs by mouth daily as needed (cough). Hyland's kids cough syrup    . Polyvinyl Alcohol-Povidone (REFRESH OP) Place 1 drop into both eyes daily as needed (dry eyes).    [name] tablet TAKE 1 TABLET(20 MG) BY MOUTH DAILY WITH SUPPER (Patient taking differently: Take 20 mg by mouth daily with supper. ) 30 tablet 5   No current facility-administered medications for this visit.     SURGICAL HISTORY:  Past Surgical History:  Procedure Laterality Date  . CARDIOVERSION N/A 02/19/2018   Procedure: CARDIOVERSION;  Surgeon: Sanda Klein, MD;  Location: [facility] ENDOSCOPY;  Service: Cardiovascular;  Laterality: N/A;  . CARDIOVERSION N/A 04/30/2018   Procedure: CARDIOVERSION;  Surgeon: Thayer Headings, MD;  Location: [facility] ENDOSCOPY;  Service: Cardiovascular;  Laterality: N/A;  . CATARACT EXTRACTION W/PHACO Right 08/14/2016   Procedure: CATARACT EXTRACTION PHACO AND INTRAOCULAR LENS PLACEMENT RIGHT EYE CDE 10.53;  Surgeon: Tonny Branch, MD;  Location: [facility];  Service: Ophthalmology;  Laterality: Right;  right  . CATARACT EXTRACTION W/PHACO Left 09/25/2016   Procedure: CATARACT EXTRACTION PHACO AND INTRAOCULAR LENS PLACEMENT (IOC);  Surgeon: Tonny Branch, MD;  Location: [facility];  Service: Ophthalmology;  Laterality: Left;  left CDE 8.81  . COLONOSCOPY     [facility]: cattered sigmoid diverticula, internal hemorrhoids, sessile polyp in ascending and mid transverse colon (tubular adenoma).   . colonoscopy with polypectomy  2015  . ESOPHAGOGASTRODUODENOSCOPY     [facility]: bile reflux in stomach, mild gastritis, hiatal hernia  . ESOPHAGOGASTRODUODENOSCOPY N/A 01/31/2016   Procedure:  ESOPHAGOGASTRODUODENOSCOPY (EGD);  Surgeon: [name], MD;  [facility];  Service: Endoscopy;  Laterality: N/A;  1200  . IR FLUORO GUIDE PORT INSERTION RIGHT  06/30/2017  . IR US GUIDE VASC ACCESS RIGHT  06/30/2017  . LOBECTOMY Right 10/27/2012   Procedure: LOBECTOMY;  Surgeon: [name], MD;  Location: [facility];  Service: Thoracic;  Laterality: Right;   RIGHT UPPER LOBECTOMY & Node Dissection  . LYMPH NODE BIOPSY Right 05/01/2014   Procedure: LYMPH NODE BIOPSY, Right Axillary;  Surgeon: [name], MD;  Location: [facility];  Service: Thoracic;  Laterality: Right;  . PACEMAKER IMPLANT N/A 10/30/2017   SJM Assurity MRI PPM implanted by Dr [name] once leadless pacemaker battery depleted  . PARTIAL HYSTERECTOMY    . PERMANENT PACEMAKER INSERTION N/A 09/14/2012   Nanostim (SJM) leadless pacemaker (LEADLESS II STUDY PATIENT) implanted by Dr [name], no longer functioning, device abandoned.  [name] VIDEO ASSISTED THORACOSCOPY (VATS)/WEDGE RESECTION Right 10/27/2012   Procedure: VIDEO ASSISTED THORACOSCOPY (VATS),RGHT UPPER LOBE LUNG WEDGE RESECTION;  Surgeon: Remo Lipps  Chaya Jan, MD;  Location: [facility];  Service: Thoracic;  Laterality: Right;    REVIEW OF SYSTEMS:  A comprehensive review of systems was negative.   PHYSICAL EXAMINATION: General appearance: alert, cooperative and no distress Head: Normocephalic, without obvious abnormality, atraumatic Neck: no adenopathy, no JVD, supple, symmetrical, trachea midline and thyroid not enlarged, symmetric, no tenderness/mass/nodules Lymph nodes: Cervical, supraclavicular, and axillary nodes normal. Resp: clear to auscultation bilaterally Back: symmetric, no curvature. ROM normal. No CVA tenderness. Cardio: regular rate and rhythm, S1, S2 normal, no murmur, click, rub or gallop GI: soft, non-tender; bowel sounds normal; no masses,  no organomegaly Extremities: extremities normal, atraumatic, no cyanosis or edema  ECOG PERFORMANCE  STATUS: 1 - Symptomatic but completely ambulatory  Blood pressure (!) 156/67, pulse 89, temperature 97.9 F (36.6 C), temperature source Oral, resp. rate 18, height 5\' 2"  (1.575 m), weight 151 lb 3.2 oz (68.6 kg), SpO2 98 %.  LABORATORY DATA: Lab Results  Component Value Date   WBC 4.9 09/28/2018   HGB 13.2 09/28/2018   HCT 38.8 09/28/2018   MCV 93.9 09/28/2018   PLT 169 09/28/2018      Chemistry      Component Value Date/Time   NA 138 09/04/2018 1646   NA 139 08/05/2017 0902   K 3.8 09/04/2018 1646   K 4.1 08/05/2017 0902   CL 105 09/04/2018 1646   CO2 26 09/04/2018 1646   CO2 28 08/05/2017 0902   BUN 11 09/04/2018 1646   BUN 10.1 08/05/2017 0902   CREATININE 0.45 09/04/2018 1646   CREATININE 0.77 08/31/2018 0847   CREATININE 0.8 08/05/2017 0902   GLU 134 (H) 02/18/2017 1410      Component Value Date/Time   CALCIUM 9.3 09/04/2018 1646   CALCIUM 9.5 08/05/2017 0902   ALKPHOS 109 09/04/2018 1646   ALKPHOS 111 08/05/2017 0902   AST 43 (H) 09/04/2018 1646   AST 40 08/31/2018 0847   AST 33 08/05/2017 0902   ALT 38 09/04/2018 1646   ALT 39 08/31/2018 0847   ALT 34 08/05/2017 0902   BILITOT 1.0 09/04/2018 1646   BILITOT 1.3 (H) 08/31/2018 0847   BILITOT 1.39 (H) 08/05/2017 0902       RADIOGRAPHIC STUDIES: Dg Chest 2 View  Result Date: 09/04/2018 CLINICAL DATA:  73 year old female with cough. Nausea vomiting. Lung cancer on chemotherapy. EXAM: CHEST - 2 VIEW COMPARISON:  Restaging CT Chest, Abdomen, and Pelvis 08/27/2018, and earlier. Chest radiograph 02/26/2018. FINDINGS: Postoperative changes to the right lung with stable lung volumes and stable/normal mediastinal contours. Right chest power port remains in place and is accessed. Stable left chest cardiac pacemaker and loop recorder. Visualized tracheal air column is within normal limits. No pneumothorax, pulmonary edema, pleural effusion or confluent pulmonary opacity. No acute osseous abnormality identified. Negative  visible bowel gas pattern. IMPRESSION: Stable chest.  No acute cardiopulmonary abnormality. Electronically Signed   By: Genevie Ann M.D.   On: 09/04/2018 18:19   ASSESSMENT AND PLAN:  This is a very pleasant 73 years old white female with metastatic non-small cell lung cancer, adenocarcinoma with no actionable mutations status post systemic chemotherapy with carboplatin and Alimta and she is currently undergoing treatment with second line immunotherapy with Nivolumab status post 6 cycles. This was followed by immunotherapy with Nivolumab 480 mg IV every 4 weeks status post 20 cycles. The patient has been tolerating her treatment well with no concerning adverse effects. I recommended for her to proceed with cycle #21 today as scheduled.  For the cardiac arrhythmia she is followed by cardiology and expected to have intervention in March. The patient will continue her current medication for hypertension. The patient will come back for follow-up visit in 4 weeks for evaluation before the next cycle of her treatment. She was advised to call immediately if she has any concerning symptoms in the interval. The patient voices understanding of current disease status and treatment options and is in agreement with the current care plan. All questions were answered. The patient knows to call the clinic with any problems, questions or concerns. We can certainly see the patient much sooner if necessary.  Disclaimer: This note was dictated with voice recognition software. Similar sounding words can inadvertently be transcribed and may not be corrected upon review.

## 2018-09-28 NOTE — Telephone Encounter (Signed)
Scheduled appt per 2/25 los - pt to get an updated schedule next visit. Added another cycle

## 2018-10-01 ENCOUNTER — Other Ambulatory Visit: Payer: Self-pay | Admitting: Internal Medicine

## 2018-10-08 ENCOUNTER — Encounter: Payer: Self-pay | Admitting: Medical Oncology

## 2018-10-08 ENCOUNTER — Telehealth: Payer: Self-pay | Admitting: Internal Medicine

## 2018-10-08 NOTE — Telephone Encounter (Signed)
MM PAL 3/24 moved appointments to 3/26. Spoke with patient re change. Due to transportation, already having made arrangements for 3/24 and having an ablason at [facility] 3/27 patient is asking if she can see someone else 3/24 or be rescheduled for the following week. Message to MM re patient request.

## 2018-10-09 NOTE — Telephone Encounter (Signed)
She can see one of the other APPs.

## 2018-10-13 ENCOUNTER — Telehealth: Payer: Self-pay | Admitting: Internal Medicine

## 2018-10-13 NOTE — Telephone Encounter (Signed)
° °  Patient requesting call from nurse to go over instructions leading up to 3/27 ablation

## 2018-10-19 NOTE — Telephone Encounter (Signed)
Call returned to Pt.  Advised Pt that afib ablation would need to be cancelled at this time.  Pt indicates understanding.  Will reschedule at next convenient time for Pt.

## 2018-10-25 ENCOUNTER — Other Ambulatory Visit: Payer: Self-pay | Admitting: Medical Oncology

## 2018-10-25 DIAGNOSIS — C3490 Malignant neoplasm of unspecified part of unspecified bronchus or lung: Secondary | ICD-10-CM

## 2018-10-26 ENCOUNTER — Inpatient Hospital Stay: Payer: Medicare Other

## 2018-10-26 ENCOUNTER — Other Ambulatory Visit: Payer: Medicare Other

## 2018-10-26 ENCOUNTER — Encounter: Payer: Self-pay | Admitting: Internal Medicine

## 2018-10-26 ENCOUNTER — Other Ambulatory Visit: Payer: Self-pay

## 2018-10-26 ENCOUNTER — Telehealth: Payer: Self-pay | Admitting: Internal Medicine

## 2018-10-26 ENCOUNTER — Ambulatory Visit: Payer: Medicare Other | Admitting: Internal Medicine

## 2018-10-26 ENCOUNTER — Ambulatory Visit: Payer: Medicare Other

## 2018-10-26 ENCOUNTER — Inpatient Hospital Stay: Payer: Medicare Other | Attending: Internal Medicine | Admitting: Internal Medicine

## 2018-10-26 VITALS — BP 148/72 | HR 87 | Temp 98.2°F | Resp 20 | Ht 62.0 in | Wt 150.5 lb

## 2018-10-26 DIAGNOSIS — C773 Secondary and unspecified malignant neoplasm of axilla and upper limb lymph nodes: Secondary | ICD-10-CM

## 2018-10-26 DIAGNOSIS — C3490 Malignant neoplasm of unspecified part of unspecified bronchus or lung: Secondary | ICD-10-CM

## 2018-10-26 DIAGNOSIS — Z902 Acquired absence of lung [part of]: Secondary | ICD-10-CM

## 2018-10-26 DIAGNOSIS — Z95828 Presence of other vascular implants and grafts: Secondary | ICD-10-CM

## 2018-10-26 DIAGNOSIS — Z79899 Other long term (current) drug therapy: Secondary | ICD-10-CM

## 2018-10-26 DIAGNOSIS — C3491 Malignant neoplasm of unspecified part of right bronchus or lung: Secondary | ICD-10-CM

## 2018-10-26 DIAGNOSIS — I498 Other specified cardiac arrhythmias: Secondary | ICD-10-CM | POA: Diagnosis not present

## 2018-10-26 DIAGNOSIS — Z5112 Encounter for antineoplastic immunotherapy: Secondary | ICD-10-CM | POA: Diagnosis present

## 2018-10-26 DIAGNOSIS — Z950 Presence of cardiac pacemaker: Secondary | ICD-10-CM

## 2018-10-26 DIAGNOSIS — C3411 Malignant neoplasm of upper lobe, right bronchus or lung: Secondary | ICD-10-CM

## 2018-10-26 DIAGNOSIS — Z923 Personal history of irradiation: Secondary | ICD-10-CM

## 2018-10-26 DIAGNOSIS — I10 Essential (primary) hypertension: Secondary | ICD-10-CM

## 2018-10-26 DIAGNOSIS — Z9221 Personal history of antineoplastic chemotherapy: Secondary | ICD-10-CM

## 2018-10-26 LAB — CMP (CANCER CENTER ONLY)
ALT: 31 U/L (ref 0–44)
AST: 34 U/L (ref 15–41)
Albumin: 3.9 g/dL (ref 3.5–5.0)
Alkaline Phosphatase: 109 U/L (ref 38–126)
Anion gap: 11 (ref 5–15)
BUN: 9 mg/dL (ref 8–23)
CO2: 25 mmol/L (ref 22–32)
Calcium: 9.7 mg/dL (ref 8.9–10.3)
Chloride: 106 mmol/L (ref 98–111)
Creatinine: 0.7 mg/dL (ref 0.44–1.00)
GFR, Est AFR Am: >60 mL/min (ref >60)
GFR, Estimated: >60 mL/min (ref >60)
Glucose, Bld: 102 mg/dL — ABNORMAL HIGH (ref 70–99)
Potassium: 4.2 mmol/L (ref 3.5–5.1)
Sodium: 142 mmol/L (ref 135–145)
Total Bilirubin: 1.2 mg/dL (ref 0.3–1.2)
Total Protein: 7 g/dL (ref 6.5–8.1)

## 2018-10-26 LAB — CBC WITH DIFFERENTIAL (CANCER CENTER ONLY)
Abs Immature Granulocytes: 0.02 10*3/uL (ref 0.00–0.07)
Basophils Absolute: 0 10*3/uL (ref 0.0–0.1)
Basophils Relative: 1 %
EOS PCT: 2 %
Eosinophils Absolute: 0.2 10*3/uL (ref 0.0–0.5)
HCT: 39.2 % (ref 36.0–46.0)
HEMOGLOBIN: 13.3 g/dL (ref 12.0–15.0)
Immature Granulocytes: 0 %
LYMPHS PCT: 19 %
Lymphs Abs: 1.2 10*3/uL (ref 0.7–4.0)
MCH: 32.4 pg (ref 26.0–34.0)
MCHC: 33.9 g/dL (ref 30.0–36.0)
MCV: 95.4 fL (ref 80.0–100.0)
Monocytes Absolute: 0.4 10*3/uL (ref 0.1–1.0)
Monocytes Relative: 7 %
Neutro Abs: 4.5 10*3/uL (ref 1.7–7.7)
Neutrophils Relative %: 71 %
Platelet Count: 176 10*3/uL (ref 150–400)
RBC: 4.11 MIL/uL (ref 3.87–5.11)
RDW: 12.8 % (ref 11.5–15.5)
WBC Count: 6.3 10*3/uL (ref 4.0–10.5)
nRBC: 0 % (ref 0.0–0.2)

## 2018-10-26 MED ORDER — SODIUM CHLORIDE 0.9% FLUSH
10.0000 mL | INTRAVENOUS | Status: DC | PRN
  Administered 2018-10-26: 10 mL via INTRAVENOUS
  Filled 2018-10-26: qty 10

## 2018-10-26 MED ORDER — SODIUM CHLORIDE 0.9 % IV SOLN
Freq: Once | INTRAVENOUS | Status: AC
  Administered 2018-10-26: 12:00:00 via INTRAVENOUS
  Filled 2018-10-26: qty 250

## 2018-10-26 MED ORDER — SODIUM CHLORIDE 0.9% FLUSH
10.0000 mL | INTRAVENOUS | Status: DC | PRN
  Administered 2018-10-26: 10 mL
  Filled 2018-10-26: qty 10

## 2018-10-26 MED ORDER — SODIUM CHLORIDE 0.9 % IV SOLN
480.0000 mg | Freq: Once | INTRAVENOUS | Status: AC
  Administered 2018-10-26: 480 mg via INTRAVENOUS
  Filled 2018-10-26: qty 48

## 2018-10-26 MED ORDER — HEPARIN SOD (PORK) LOCK FLUSH 100 UNIT/ML IV SOLN
500.0000 [IU] | Freq: Once | INTRAVENOUS | Status: AC | PRN
  Administered 2018-10-26: 500 [IU]
  Filled 2018-10-26: qty 5

## 2018-10-26 NOTE — Telephone Encounter (Signed)
Gave avs and calendar ° °

## 2018-10-26 NOTE — Patient Instructions (Signed)
[facility] Discharge Instructions for Patients Receiving Chemotherapy  Today you received the following chemotherapy agents:  Nivolumab  To help prevent nausea and vomiting after your treatment, we encourage you to take your nausea medication as prescribed.   If you develop nausea and vomiting that is not controlled by your nausea medication, call the clinic.   BELOW ARE SYMPTOMS THAT SHOULD BE REPORTED IMMEDIATELY:  *FEVER GREATER THAN 100.5 F  *CHILLS WITH OR WITHOUT FEVER  NAUSEA AND VOMITING THAT IS NOT CONTROLLED WITH YOUR NAUSEA MEDICATION  *UNUSUAL SHORTNESS OF BREATH  *UNUSUAL BRUISING OR BLEEDING  TENDERNESS IN MOUTH AND THROAT WITH OR WITHOUT PRESENCE OF ULCERS  *URINARY PROBLEMS  *BOWEL PROBLEMS  UNUSUAL RASH Items with * indicate a potential emergency and should be followed up as soon as possible.  Feel free to call the clinic should you have any questions or concerns. The clinic phone number is [phone].  Please show the CHEMO ALERT CARD at check-in to the Emergency Department and triage nurse.   

## 2018-10-26 NOTE — Progress Notes (Signed)
[facility] [phone]) [phone]   Fax:(336) [phone]  OFFICE PROGRESS NOTE  Hodges, Nancy L, [name] [address] [place] [address]  DIAGNOSIS: Metastatic non-small cell lung cancer, adenocarcinoma. This was initially diagnosed as stage IB (T2a, N0, M0) in March of 2014, status post right upper lobectomy with lymph node dissection but the patient has evidence for disease recurrence in the right axilla status post resection.  PRIOR THERAPY:  1) Status post right axillary lymph node biopsy on 05/01/2014 under the care of Dr. Roxan Hockey. 2) Systemic chemotherapy with carboplatin for AUC of 5 and Alimta 500 mg/M2 every 3 weeks. First cycle 05/31/2014. Status post 6 cycles. Last dose was given 09/22/2014. 3) palliative radiotherapy to the subcarinal lymph node under the care of Dr. Sondra Come. 4) Nivolumab 240 mg IV every 2 weeks. First dose 12/23/2016. Status post 6 cycles.  CURRENT THERAPY:  Nivolumab 480 mg IV every 4 weeks. First dose 03/17/2017. Status post 21 cycles.  INTERVAL HISTORY: Nancy Hodges 73 y.o. female returns to the clinic today for follow-up visit.  The patient is feeling fine today with no concerning complaints.  She had a spider bite at her left hand a week ago.  She was on treatment with doxycycline for sinus infection and it did help with her spider bite.  She denied having any current chest pain, shortness of breath, cough or hemoptysis.  She denied having any fever or chills.  She has no nausea, vomiting, diarrhea or constipation.  She has no headache or visual changes.  She is here today for evaluation before starting cycle #22.  MEDICAL HISTORY: Past Medical History:  Diagnosis Date  . Allergic rhinitis   . Anxiety   . Arthritis   . Asthma   . Atrial flutter ([place])   . Cholelithiases 09/26/2015  . Diverticulosis   . Encounter for antineoplastic immunotherapy 12/09/2016  . GERD (gastroesophageal reflux disease)   . H/O hiatal hernia    . History of blood transfusion   . History of chemotherapy   . History of kidney stones   . History of radiation therapy   . Internal hemorrhoid   . Lung cancer ([place])    a. Stage IB non-small cell carcinoma, s/p R VATS, wedge resection, RU lobectomy 10/2012.  . Paroxysmal atrial fibrillation ([facility])    a. anticoagulated with Xarelto  . Pneumonia 06/2016   [facility]  . Presence of permanent cardiac pacemaker   . Pulmonary nodule   . Tachycardia-bradycardia syndrome ([place])    a. 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    ALLERGIES:  is allergic to chlorpheniramine-dm; prednisone; septra [sulfamethoxazole-trimethoprim]; vioxx [rofecoxib]; and penicillins.  MEDICATIONS:  Current Outpatient Medications  Medication Sig Dispense Refill  . acetaminophen (TYLENOL) 500 MG tablet Take 500 mg by mouth 3 (three) times daily as needed for moderate pain or fever.     [name] albuterol (PROVENTIL HFA;VENTOLIN HFA) 108 (90 BASE) MCG/ACT inhaler Inhale 2 puffs into the lungs every 6 (six) hours as needed for wheezing or shortness of breath.     [name] azelastine (ASTELIN) 0.1 % nasal spray Place 1 spray into both nostrils 2 (two) times daily. Use in each nostril as directed    . calcium carbonate (TUMS - DOSED IN MG ELEMENTAL CALCIUM) 500 MG chewable tablet Chew 1 tablet by mouth 2 (two) times daily as needed for indigestion or heartburn.     [name] CARTIA XT 240 MG 24 hr capsule TAKE 1 CAPSULE(240  MG) BY MOUTH DAILY (Patient taking differently: Take 240 mg by mouth daily. ) 90 capsule 3  . dimenhyDRINATE (DRAMAMINE) 50 MG tablet Take 12.5 mg by mouth every 8 (eight) hours as needed for dizziness.    [name] ipratropium-albuterol (DUONEB) 0.5-2.5 (3) MG/3ML SOLN Take 3 mLs by nebulization every 6 (six) hours as needed (shortness of breath or wheezing).     [name] lidocaine-prilocaine (EMLA) cream Apply 1 application topically as needed. (Patient taking differently: Apply 1 application topically as needed (for port). ) 30 g 4   . loratadine (CLARITIN) 10 MG tablet Take 10 mg by mouth daily.    . metoprolol tartrate (LOPRESSOR) 100 MG tablet Take 1 tablet (100 mg total) by mouth once for 1 dose. Take one tablet 2 hours prior to cardiac CT 1 tablet 0  . ondansetron (ZOFRAN ODT) 4 MG disintegrating tablet Take 1 tablet (4 mg total) by mouth every 8 (eight) hours as needed for nausea or vomiting. 20 tablet 0  . OVER THE COUNTER MEDICATION Take 5 mLs by mouth daily as needed (cough). [employer]'s kids cough syrup    . Polyvinyl Alcohol-Povidone (REFRESH OP) Place 1 drop into both eyes daily as needed (dry eyes).    [name] tablet TAKE 1 TABLET(20 MG) BY MOUTH DAILY WITH SUPPER 30 tablet 5   No current facility-administered medications for this visit.     SURGICAL HISTORY:  Past Surgical History:  Procedure Laterality Date  . CARDIOVERSION N/A 02/19/2018   Procedure: CARDIOVERSION;  Surgeon: Sanda Klein, MD;  Location: [facility] ENDOSCOPY;  Service: Cardiovascular;  Laterality: N/A;  . CARDIOVERSION N/A 04/30/2018   Procedure: CARDIOVERSION;  Surgeon: Thayer Headings, MD;  Location: [facility] ENDOSCOPY;  Service: Cardiovascular;  Laterality: N/A;  . CATARACT EXTRACTION W/PHACO Right 08/14/2016   Procedure: CATARACT EXTRACTION PHACO AND INTRAOCULAR LENS PLACEMENT RIGHT EYE CDE 10.53;  Surgeon: Tonny Branch, MD;  [facility];  Service: Ophthalmology;  Laterality: Right;  right  . CATARACT EXTRACTION W/PHACO Left 09/25/2016   Procedure: CATARACT EXTRACTION PHACO AND INTRAOCULAR LENS PLACEMENT (IOC);  Surgeon: Tonny Branch, MD;  [facility];  Service: Ophthalmology;  Laterality: Left;  left CDE 8.81  . COLONOSCOPY     [facility]: cattered sigmoid diverticula, internal hemorrhoids, sessile polyp in ascending and mid transverse colon (tubular adenoma).   . colonoscopy with polypectomy  2015  . ESOPHAGOGASTRODUODENOSCOPY     [facility]: bile reflux in stomach, mild gastritis, hiatal hernia  . ESOPHAGOGASTRODUODENOSCOPY N/A  01/31/2016   Procedure: ESOPHAGOGASTRODUODENOSCOPY (EGD);  Surgeon: [name], MD;  [facility];  Service: Endoscopy;  Laterality: N/A;  1200  . IR FLUORO GUIDE PORT INSERTION RIGHT  06/30/2017  . IR US GUIDE VASC ACCESS RIGHT  06/30/2017  . LOBECTOMY Right 10/27/2012   Procedure: LOBECTOMY;  Surgeon: [name], MD;  Location: [facility];  Service: Thoracic;  Laterality: Right;   RIGHT UPPER LOBECTOMY & Node Dissection  . LYMPH NODE BIOPSY Right 05/01/2014   Procedure: LYMPH NODE BIOPSY, Right Axillary;  Surgeon: [name], MD;  Location: [facility];  Service: Thoracic;  Laterality: Right;  . PACEMAKER IMPLANT N/A 10/30/2017   SJM Assurity MRI PPM implanted by Dr [name] once leadless pacemaker battery depleted  . PARTIAL HYSTERECTOMY    . PERMANENT PACEMAKER INSERTION N/A 09/14/2012   Nanostim (SJM) leadless pacemaker (LEADLESS II STUDY PATIENT) implanted by Dr [name], no longer functioning, device abandoned.  [name] VIDEO ASSISTED THORACOSCOPY (VATS)/WEDGE RESECTION Right 10/27/2012  Procedure: VIDEO ASSISTED THORACOSCOPY (VATS),RGHT UPPER LOBE LUNG WEDGE RESECTION;  Surgeon: [name], MD;  Location: [facility];  Service: Thoracic;  Laterality: Right;    REVIEW OF SYSTEMS:  A comprehensive review of systems was negative.   PHYSICAL EXAMINATION: General appearance: alert, cooperative and no distress Head: Normocephalic, without obvious abnormality, atraumatic Neck: no adenopathy, no JVD, supple, symmetrical, trachea midline and thyroid not enlarged, symmetric, no tenderness/mass/nodules Lymph nodes: Cervical, supraclavicular, and axillary nodes normal. Resp: clear to auscultation bilaterally Back: symmetric, no curvature. ROM normal. No CVA tenderness. Cardio: regular rate and rhythm, S1, S2 normal, no murmur, click, rub or gallop GI: soft, non-tender; bowel sounds normal; no masses,  no organomegaly Extremities: extremities normal, atraumatic, no cyanosis or  edema  ECOG PERFORMANCE STATUS: 1 - Symptomatic but completely ambulatory  Blood pressure (!) 148/72, pulse 87, temperature 98.2 F (36.8 C), temperature source Oral, resp. rate 20, height 5\' 2"  (1.575 m), weight 150 lb 8 oz (68.3 kg), SpO2 100 %.  LABORATORY DATA: Lab Results  Component Value Date   WBC 6.3 10/26/2018   HGB 13.3 10/26/2018   HCT 39.2 10/26/2018   MCV 95.4 10/26/2018   PLT 176 10/26/2018      Chemistry      Component Value Date/Time   NA 141 09/28/2018 0915   NA 139 08/05/2017 0902   K 4.2 09/28/2018 0915   K 4.1 08/05/2017 0902   CL 107 09/28/2018 0915   CO2 28 09/28/2018 0915   CO2 28 08/05/2017 0902   BUN 9 09/28/2018 0915   BUN 10.1 08/05/2017 0902   CREATININE 0.73 09/28/2018 0915   CREATININE 0.8 08/05/2017 0902   GLU 134 (H) 02/18/2017 1410      Component Value Date/Time   CALCIUM 9.4 09/28/2018 0915   CALCIUM 9.5 08/05/2017 0902   ALKPHOS 124 09/28/2018 0915   ALKPHOS 111 08/05/2017 0902   AST 39 09/28/2018 0915   AST 33 08/05/2017 0902   ALT 37 09/28/2018 0915   ALT 34 08/05/2017 0902   BILITOT 0.9 09/28/2018 0915   BILITOT 1.39 (H) 08/05/2017 0902       RADIOGRAPHIC STUDIES: No results found. ASSESSMENT AND PLAN:  This is a very pleasant 72 years old white female with metastatic non-small cell lung cancer, adenocarcinoma with no actionable mutations status post systemic chemotherapy with carboplatin and Alimta and she is currently undergoing treatment with second line immunotherapy with Nivolumab status post 6 cycles. This was followed by immunotherapy with Nivolumab 480 mg IV every 4 weeks status post 21 cycles. The patient continues to tolerate her treatment well with no concerning complaints. I recommended for her to proceed with cycle #22. I will see her back for follow-up visit in 4 weeks for evaluation after repeating CT scan of the chest, abdomen and pelvis for restaging of her disease. For the cardiac arrhythmia she is  followed by cardiology and expected to have intervention in March. The patient will continue her current medication for hypertension. She was advised to call immediately if she has any concerning symptoms in the interval. The patient voices understanding of current disease status and treatment options and is in agreement with the current care plan. All questions were answered. The patient knows to call the clinic with any problems, questions or concerns. We can certainly see the patient much sooner if necessary.  Disclaimer: This note was dictated with voice recognition software. Similar sounding words can inadvertently be transcribed and may not be corrected upon review.

## 2018-10-27 ENCOUNTER — Ambulatory Visit: Payer: Medicare Other | Admitting: Internal Medicine

## 2018-10-27 ENCOUNTER — Telehealth: Payer: Self-pay | Admitting: *Deleted

## 2018-10-27 NOTE — Telephone Encounter (Signed)
Oncology Nurse Navigator Documentation  Oncology Nurse Navigator Flowsheets 10/27/2018  Navigator Location [facility]  Navigator Encounter Type Telephone/I called to check on Nancy Hodges.  She is doing well.  I listened as she explained.  I asked her to call if she has any new cough or fever.  She verbalized understanding to call.   Telephone Outgoing Call  Patient Visit Type -  Treatment Phase Treatment  Barriers/Navigation Needs Education  Education Other  Interventions Education  Coordination of Care -  Education Method Verbal  Acuity Level 1  Acuity Level 1 -  Acuity Level 2 -  Time Spent with Patient 30

## 2018-10-28 ENCOUNTER — Other Ambulatory Visit: Payer: Medicare Other

## 2018-10-28 ENCOUNTER — Ambulatory Visit: Payer: Medicare Other

## 2018-10-28 ENCOUNTER — Ambulatory Visit: Payer: Medicare Other | Admitting: Internal Medicine

## 2018-10-29 ENCOUNTER — Encounter (HOSPITAL_COMMUNITY): Payer: Self-pay

## 2018-10-29 ENCOUNTER — Ambulatory Visit (HOSPITAL_COMMUNITY): Admit: 2018-10-29 | Payer: Medicare Other | Admitting: Internal Medicine

## 2018-10-29 SURGERY — ATRIAL FIBRILLATION ABLATION
Anesthesia: General

## 2018-11-01 ENCOUNTER — Other Ambulatory Visit: Payer: Self-pay

## 2018-11-01 ENCOUNTER — Ambulatory Visit (INDEPENDENT_AMBULATORY_CARE_PROVIDER_SITE_OTHER): Payer: Medicare Other | Admitting: *Deleted

## 2018-11-01 DIAGNOSIS — I495 Sick sinus syndrome: Secondary | ICD-10-CM | POA: Diagnosis not present

## 2018-11-01 DIAGNOSIS — R001 Bradycardia, unspecified: Secondary | ICD-10-CM

## 2018-11-01 LAB — CUP PACEART REMOTE DEVICE CHECK
Battery Remaining Longevity: 114 mo
Battery Voltage: 3.02 V
Brady Statistic AP VP Percent: 1 %
Brady Statistic AP VS Percent: 1.3 %
Brady Statistic AS VP Percent: 1 %
Brady Statistic AS VS Percent: 98 %
Brady Statistic RA Percent Paced: 1 %
Brady Statistic RV Percent Paced: 13 %
Date Time Interrogation Session: 20200330060016
Implantable Lead Implant Date: 20190329
Implantable Lead Implant Date: 20190329
Implantable Lead Location: 753859
Implantable Lead Location: 753860
Implantable Pulse Generator Implant Date: 20190329
Lead Channel Impedance Value: 410 Ohm
Lead Channel Impedance Value: 560 Ohm
Lead Channel Pacing Threshold Amplitude: 1 V
Lead Channel Pacing Threshold Amplitude: 1 V
Lead Channel Pacing Threshold Pulse Width: 0.5 ms
Lead Channel Pacing Threshold Pulse Width: 0.5 ms
Lead Channel Sensing Intrinsic Amplitude: 12 mV
Lead Channel Setting Pacing Amplitude: 2 V
Lead Channel Setting Pacing Amplitude: 2.5 V
Lead Channel Setting Pacing Pulse Width: 0.5 ms
MDC IDC MSMT BATTERY REMAINING PERCENTAGE: 95.5 %
MDC IDC MSMT LEADCHNL RA SENSING INTR AMPL: 3.4 mV
MDC IDC SET LEADCHNL RV SENSING SENSITIVITY: 2 mV
Pulse Gen Serial Number: 9003317

## 2018-11-09 NOTE — Progress Notes (Signed)
Remote pacemaker transmission.   

## 2018-11-19 ENCOUNTER — Other Ambulatory Visit: Payer: Self-pay | Admitting: *Deleted

## 2018-11-19 DIAGNOSIS — Z5112 Encounter for antineoplastic immunotherapy: Secondary | ICD-10-CM

## 2018-11-19 DIAGNOSIS — E032 Hypothyroidism due to medicaments and other exogenous substances: Secondary | ICD-10-CM

## 2018-11-22 ENCOUNTER — Ambulatory Visit (HOSPITAL_COMMUNITY)
Admission: RE | Admit: 2018-11-22 | Discharge: 2018-11-22 | Disposition: A | Discharge status: Home / Self Care | Payer: Medicare Other | Source: Ambulatory Visit | Attending: Internal Medicine | Admitting: Internal Medicine

## 2018-11-22 ENCOUNTER — Other Ambulatory Visit: Payer: Self-pay

## 2018-11-22 DIAGNOSIS — C3490 Malignant neoplasm of unspecified part of unspecified bronchus or lung: Secondary | ICD-10-CM | POA: Diagnosis present

## 2018-11-22 MED ORDER — IOHEXOL 300 MG/ML  SOLN
100.0000 mL | Freq: Once | INTRAMUSCULAR | Status: AC | PRN
  Administered 2018-11-22: 100 mL via INTRAVENOUS

## 2018-11-22 MED ORDER — HEPARIN SOD (PORK) LOCK FLUSH 100 UNIT/ML IV SOLN
INTRAVENOUS | Status: AC
  Administered 2018-11-22: 500 [IU]
  Filled 2018-11-22: qty 5

## 2018-11-23 ENCOUNTER — Inpatient Hospital Stay: Payer: Medicare Other | Attending: Internal Medicine | Admitting: Internal Medicine

## 2018-11-23 ENCOUNTER — Inpatient Hospital Stay: Payer: Medicare Other

## 2018-11-23 ENCOUNTER — Other Ambulatory Visit: Payer: Self-pay

## 2018-11-23 ENCOUNTER — Encounter: Payer: Self-pay | Admitting: Internal Medicine

## 2018-11-23 VITALS — BP 144/61 | HR 88 | Temp 98.2°F | Resp 20 | Ht 62.0 in | Wt 151.6 lb

## 2018-11-23 DIAGNOSIS — Z5112 Encounter for antineoplastic immunotherapy: Secondary | ICD-10-CM

## 2018-11-23 DIAGNOSIS — C3490 Malignant neoplasm of unspecified part of unspecified bronchus or lung: Secondary | ICD-10-CM

## 2018-11-23 DIAGNOSIS — I10 Essential (primary) hypertension: Secondary | ICD-10-CM

## 2018-11-23 DIAGNOSIS — I499 Cardiac arrhythmia, unspecified: Secondary | ICD-10-CM

## 2018-11-23 DIAGNOSIS — E032 Hypothyroidism due to medicaments and other exogenous substances: Secondary | ICD-10-CM

## 2018-11-23 DIAGNOSIS — Z79899 Other long term (current) drug therapy: Secondary | ICD-10-CM | POA: Diagnosis not present

## 2018-11-23 DIAGNOSIS — Z95828 Presence of other vascular implants and grafts: Secondary | ICD-10-CM

## 2018-11-23 DIAGNOSIS — C773 Secondary and unspecified malignant neoplasm of axilla and upper limb lymph nodes: Secondary | ICD-10-CM | POA: Insufficient documentation

## 2018-11-23 DIAGNOSIS — C3411 Malignant neoplasm of upper lobe, right bronchus or lung: Secondary | ICD-10-CM

## 2018-11-23 DIAGNOSIS — C3491 Malignant neoplasm of unspecified part of right bronchus or lung: Secondary | ICD-10-CM

## 2018-11-23 LAB — CMP (CANCER CENTER ONLY)
ALT: 40 U/L (ref 0–44)
AST: 42 U/L — ABNORMAL HIGH (ref 15–41)
Albumin: 4 g/dL (ref 3.5–5.0)
Alkaline Phosphatase: 116 U/L (ref 38–126)
Anion gap: 8 (ref 5–15)
BUN: 10 mg/dL (ref 8–23)
CO2: 26 mmol/L (ref 22–32)
Calcium: 9.5 mg/dL (ref 8.9–10.3)
Chloride: 105 mmol/L (ref 98–111)
Creatinine: 0.72 mg/dL (ref 0.44–1.00)
GFR, Est AFR Am: >60 mL/min (ref >60)
GFR, Estimated: >60 mL/min (ref >60)
Glucose, Bld: 106 mg/dL — ABNORMAL HIGH (ref 70–99)
Potassium: 4.4 mmol/L (ref 3.5–5.1)
Sodium: 139 mmol/L (ref 135–145)
Total Bilirubin: 1 mg/dL (ref 0.3–1.2)
Total Protein: 7.4 g/dL (ref 6.5–8.1)

## 2018-11-23 LAB — CBC WITH DIFFERENTIAL (CANCER CENTER ONLY)
Abs Immature Granulocytes: 0.01 10*3/uL (ref 0.00–0.07)
Basophils Absolute: 0 10*3/uL (ref 0.0–0.1)
Basophils Relative: 1 %
Eosinophils Absolute: 0.1 10*3/uL (ref 0.0–0.5)
Eosinophils Relative: 3 %
HCT: 39.4 % (ref 36.0–46.0)
Hemoglobin: 13.4 g/dL (ref 12.0–15.0)
Immature Granulocytes: 0 %
Lymphocytes Relative: 22 %
Lymphs Abs: 1.1 10*3/uL (ref 0.7–4.0)
MCH: 32.4 pg (ref 26.0–34.0)
MCHC: 34 g/dL (ref 30.0–36.0)
MCV: 95.2 fL (ref 80.0–100.0)
Monocytes Absolute: 0.4 10*3/uL (ref 0.1–1.0)
Monocytes Relative: 8 %
Neutro Abs: 3.3 10*3/uL (ref 1.7–7.7)
Neutrophils Relative %: 66 %
Platelet Count: 176 10*3/uL (ref 150–400)
RBC: 4.14 MIL/uL (ref 3.87–5.11)
RDW: 13.3 % (ref 11.5–15.5)
WBC Count: 5 10*3/uL (ref 4.0–10.5)
nRBC: 0 % (ref 0.0–0.2)

## 2018-11-23 LAB — TSH: TSH: 2.084 u[IU]/mL (ref 0.308–3.960)

## 2018-11-23 MED ORDER — SODIUM CHLORIDE 0.9% FLUSH
10.0000 mL | INTRAVENOUS | Status: DC | PRN
  Administered 2018-11-23: 14:00:00 10 mL
  Filled 2018-11-23: qty 10

## 2018-11-23 MED ORDER — SODIUM CHLORIDE 0.9 % IV SOLN
480.0000 mg | Freq: Once | INTRAVENOUS | Status: AC
  Administered 2018-11-23: 13:00:00 480 mg via INTRAVENOUS
  Filled 2018-11-23: qty 48

## 2018-11-23 MED ORDER — SODIUM CHLORIDE 0.9 % IV SOLN
Freq: Once | INTRAVENOUS | Status: AC
  Administered 2018-11-23: 12:00:00 via INTRAVENOUS
  Filled 2018-11-23: qty 250

## 2018-11-23 MED ORDER — SODIUM CHLORIDE 0.9% FLUSH
10.0000 mL | INTRAVENOUS | Status: DC | PRN
  Administered 2018-11-23: 10 mL via INTRAVENOUS
  Filled 2018-11-23: qty 10

## 2018-11-23 MED ORDER — HEPARIN SOD (PORK) LOCK FLUSH 100 UNIT/ML IV SOLN
500.0000 [IU] | Freq: Once | INTRAVENOUS | Status: AC | PRN
  Administered 2018-11-23: 14:00:00 500 [IU]
  Filled 2018-11-23: qty 5

## 2018-11-23 NOTE — Progress Notes (Signed)
[place] Telephone:([phone]) [phone]   [phone]  OFFICE PROGRESS NOTE  Harris, [name], [name] [address] [place] [address]  DIAGNOSIS: Metastatic non-small cell lung cancer, adenocarcinoma. This was initially diagnosed as stage IB (T2a, N0, M0) in March of 2014, status post right upper lobectomy with lymph node dissection but the patient has evidence for disease recurrence in the right axilla status post resection.  PRIOR THERAPY:  1) Status post right axillary lymph node biopsy on 05/01/2014 under the care of Dr. Roxan Hockey. 2) Systemic chemotherapy with carboplatin for AUC of 5 and Alimta 500 mg/M2 every 3 weeks. First cycle 05/31/2014. Status post 6 cycles. Last dose was given 09/22/2014. 3) palliative radiotherapy to the subcarinal lymph node under the care of Dr. Sondra Come. 4) Nivolumab 240 mg IV every 2 weeks. First dose 12/23/2016. Status post 6 cycles.  CURRENT THERAPY:  Nivolumab 480 mg IV every 4 weeks. First dose 03/17/2017. Status post 22 cycles.  INTERVAL HISTORY: Nancy Hodges 73 y.o. female returns to the clinic today for follow-up visit.  The patient is feeling fine today with no concerning complaints.  She continues to tolerate her treatment with nivolumab fairly well.  She denied having any recent chest pain, shortness of breath, cough or edema.  She denied having any fever or chills.  She has no nausea, vomiting, diarrhea or constipation.  She denied having any headache or visual changes.  The patient had repeat CT scan of the chest, abdomen and pelvis performed recently and she is here for evaluation and discussion of her scan results.  MEDICAL HISTORY: Past Medical History:  Diagnosis Date   Allergic rhinitis    Anxiety    Arthritis    Asthma    Atrial flutter ([place])    Cholelithiases 09/26/2015   Diverticulosis    Encounter for antineoplastic immunotherapy 12/09/2016   GERD (gastroesophageal reflux disease)     H/O hiatal hernia    History of blood transfusion    History of chemotherapy    History of kidney stones    History of radiation therapy    Internal hemorrhoid    Lung cancer ([place])    a. Stage IB non-small cell carcinoma, s/p R VATS, wedge resection, RU lobectomy 10/2012.   Paroxysmal atrial fibrillation ([facility])    a. anticoagulated with Xarelto   Pneumonia 06/2016   [facility]   Presence of permanent cardiac pacemaker    Pulmonary nodule    Tachycardia-bradycardia syndrome ([place])    a. 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    ALLERGIES:  is allergic to chlorpheniramine-dm; prednisone; septra [sulfamethoxazole-trimethoprim]; vioxx [rofecoxib]; and penicillins.  MEDICATIONS:  Current Outpatient Medications  Medication Sig Dispense Refill   acetaminophen (TYLENOL) 500 MG tablet Take 500 mg by mouth 3 (three) times daily as needed for moderate pain or fever.      albuterol (PROVENTIL HFA;VENTOLIN HFA) 108 (90 BASE) MCG/ACT inhaler Inhale 2 puffs into the lungs every 6 (six) hours as needed for wheezing or shortness of breath.      azelastine (ASTELIN) 0.1 % nasal spray Place 1 spray into both nostrils 2 (two) times daily. Use in each nostril as directed     calcium carbonate (TUMS - DOSED IN MG ELEMENTAL CALCIUM) 500 MG chewable tablet Chew 1 tablet by mouth 2 (two) times daily as needed for indigestion or heartburn.      CARTIA XT 240 MG 24 hr capsule TAKE 1 CAPSULE(240 MG) BY MOUTH  DAILY (Patient taking differently: Take 240 mg by mouth daily. ) 90 capsule 3   dimenhyDRINATE (DRAMAMINE) 50 MG tablet Take 12.5 mg by mouth every 8 (eight) hours as needed for dizziness.     ipratropium-albuterol (DUONEB) 0.5-2.5 (3) MG/3ML SOLN Take 3 mLs by nebulization every 6 (six) hours as needed (shortness of breath or wheezing).      lidocaine-prilocaine (EMLA) cream Apply 1 application topically as needed. (Patient taking differently: Apply 1 application topically as needed  (for port). ) 30 g 4   loratadine (CLARITIN) 10 MG tablet Take 10 mg by mouth daily.     metoprolol tartrate (LOPRESSOR) 100 MG tablet Take 1 tablet (100 mg total) by mouth once for 1 dose. Take one tablet 2 hours prior to cardiac CT 1 tablet 0   ondansetron (ZOFRAN ODT) 4 MG disintegrating tablet Take 1 tablet (4 mg total) by mouth every 8 (eight) hours as needed for nausea or vomiting. 20 tablet 0   OVER THE COUNTER MEDICATION Take 5 mLs by mouth daily as needed (cough). [employer] kids cough syrup     Polyvinyl Alcohol-Povidone (REFRESH OP) Place 1 drop into both eyes daily as needed (dry eyes).     XARELTO 20 MG TABS tablet TAKE 1 TABLET(20 MG) BY MOUTH DAILY WITH SUPPER 30 tablet 5   No current facility-administered medications for this visit.     SURGICAL HISTORY:  Past Surgical History:  Procedure Laterality Date   CARDIOVERSION N/A 02/19/2018   Procedure: CARDIOVERSION;  Surgeon: Sanda Klein, MD;  Location: [facility];  Service: Cardiovascular;  Laterality: N/A;   CARDIOVERSION N/A 04/30/2018   Procedure: CARDIOVERSION;  Surgeon: Thayer Headings, MD;  Location: [facility]COPY;  Service: Cardiovascular;  Laterality: N/A;   CATARACT EXTRACTION W/PHACO Right 08/14/2016   Procedure: CATARACT EXTRACTION PHACO AND INTRAOCULAR LENS PLACEMENT RIGHT EYE CDE 10.53;  Surgeon: Tonny Branch, MD;  Location: [facility];  Service: Ophthalmology;  Laterality: Right;  right   CATARACT EXTRACTION W/PHACO Left 09/25/2016   Procedure: CATARACT EXTRACTION PHACO AND INTRAOCULAR LENS PLACEMENT (IOC);  Surgeon: Tonny Branch, MD;  Location: [facility];  Service: Ophthalmology;  Laterality: Left;  left CDE 8.81   COLONOSCOPY     [facility]: cattered sigmoid diverticula, internal hemorrhoids, sessile polyp in ascending and mid transverse colon (tubular adenoma).    colonoscopy with polypectomy  2015   ESOPHAGOGASTRODUODENOSCOPY     [facility]: bile reflux in stomach, mild gastritis, hiatal hernia    ESOPHAGOGASTRODUODENOSCOPY N/A 01/31/2016   Procedure: ESOPHAGOGASTRODUODENOSCOPY (EGD);  Surgeon: [name], MD;  Location: [facility];  Service: Endoscopy;  Laterality: N/A;  1200   IR FLUORO GUIDE PORT INSERTION RIGHT  06/30/2017   IR US GUIDE VASC ACCESS RIGHT  06/30/2017   LOBECTOMY Right 10/27/2012   Procedure: LOBECTOMY;  Surgeon: [name], MD;  Location: [place] [place];  Service: Thoracic;  Laterality: Right;   RIGHT UPPER LOBECTOMY & Node Dissection   LYMPH NODE BIOPSY Right 05/01/2014   Procedure: LYMPH NODE BIOPSY, Right Axillary;  Surgeon: [name], MD;  Location: [place];  Service: Thoracic;  Laterality: Right;   PACEMAKER IMPLANT N/A 10/30/2017   SJM Assurity MRI PPM implanted by Dr [name] once leadless pacemaker battery depleted   PARTIAL HYSTERECTOMY     PERMANENT PACEMAKER INSERTION N/A 09/14/2012   Nanostim (SJM) leadless pacemaker (LEADLESS II STUDY PATIENT) implanted by Dr [name], no longer functioning, device abandoned.   VIDEO ASSISTED THORACOSCOPY (VATS)/WEDGE RESECTION Right 10/27/2012   Procedure: VIDEO  ASSISTED THORACOSCOPY (VATS),RGHT UPPER LOBE LUNG WEDGE RESECTION;  Surgeon: [name], MD;  Location: [facility];  Service: Thoracic;  Laterality: Right;    REVIEW OF SYSTEMS:  Constitutional: negative Eyes: negative Ears, nose, mouth, throat, and face: negative Respiratory: negative Cardiovascular: negative Gastrointestinal: negative Genitourinary:negative Integument/breast: negative Hematologic/lymphatic: negative Musculoskeletal:negative Neurological: negative Behavioral/Psych: negative Endocrine: negative Allergic/Immunologic: negative   PHYSICAL EXAMINATION: General appearance: alert, cooperative and no distress Head: Normocephalic, without obvious abnormality, atraumatic Neck: no adenopathy, no JVD, supple, symmetrical, trachea midline and thyroid not enlarged, symmetric, no tenderness/mass/nodules Lymph nodes: Cervical,  supraclavicular, and axillary nodes normal. Resp: clear to auscultation bilaterally Back: symmetric, no curvature. ROM normal. No CVA tenderness. Cardio: regular rate and rhythm, S1, S2 normal, no murmur, click, rub or gallop GI: soft, non-tender; bowel sounds normal; no masses,  no organomegaly Extremities: extremities normal, atraumatic, no cyanosis or edema Neurologic: Alert and oriented X 3, normal strength and tone. Normal symmetric reflexes. Normal coordination and gait  ECOG PERFORMANCE STATUS: 1 - Symptomatic but completely ambulatory  Blood pressure (!) 144/61, pulse 88, temperature 98.2 F (36.8 C), temperature source Oral, resp. rate 20, height 5\' 2"  (1.575 m), weight 151 lb 9.6 oz (68.8 kg), SpO2 100 %.  LABORATORY DATA: Lab Results  Component Value Date   WBC 5.0 11/23/2018   HGB 13.4 11/23/2018   HCT 39.4 11/23/2018   MCV 95.2 11/23/2018   PLT 176 11/23/2018      Chemistry      Component Value Date/Time   NA 142 10/26/2018 0909   NA 139 08/05/2017 0902   K 4.2 10/26/2018 0909   K 4.1 08/05/2017 0902   CL 106 10/26/2018 0909   CO2 25 10/26/2018 0909   CO2 28 08/05/2017 0902   BUN 9 10/26/2018 0909   BUN 10.1 08/05/2017 0902   CREATININE 0.70 10/26/2018 0909   CREATININE 0.8 08/05/2017 0902   GLU 134 (H) 02/18/2017 1410      Component Value Date/Time   CALCIUM 9.7 10/26/2018 0909   CALCIUM 9.5 08/05/2017 0902   ALKPHOS 109 10/26/2018 0909   ALKPHOS 111 08/05/2017 0902   AST 34 10/26/2018 0909   AST 33 08/05/2017 0902   ALT 31 10/26/2018 0909   ALT 34 08/05/2017 0902   BILITOT 1.2 10/26/2018 0909   BILITOT 1.39 (H) 08/05/2017 0902       RADIOGRAPHIC STUDIES: Ct Chest W Contrast  Result Date: 11/22/2018 CLINICAL DATA:  Patient with history of lung cancer. Follow-up exam. EXAM: CT CHEST, ABDOMEN, AND PELVIS WITH CONTRAST TECHNIQUE: Multidetector CT imaging of the chest, abdomen and pelvis was performed following the standard protocol during bolus  administration of intravenous contrast. CONTRAST:  1109mL OMNIPAQUE IOHEXOL 300 MG/ML  SOLN COMPARISON:  CT CAP 08/27/2018. FINDINGS: CT CHEST FINDINGS Cardiovascular: Right anterior chest wall Port-A-Cath is present with tip terminating in the superior vena cava. Left anterior chest wall multi lead pacer device. Normal heart size. Trace fluid superior pericardial recess. Thoracic aortic vascular calcifications. Mediastinum/Nodes: Postsurgical changes right axilla. No enlarged axillary, mediastinal or hilar lymphadenopathy. Small hiatal hernia. Lungs/Pleura: Postsurgical changes compatible with right upper lobectomy. Unchanged right lower lobe paramediastinal fibrosis, likely post radiation changes. Unchanged 8 mm ground-glass nodule left lung apex (image 24; series 4). No pleural effusion or pneumothorax. Unchanged small nodularity within the lingula (image 56; series 4). Musculoskeletal: Thoracic spine degenerative changes. No aggressive or acute appearing osseous lesions. CT ABDOMEN PELVIS FINDINGS Hepatobiliary: Cirrhotic morphology of the liver. No focal hepatic lesion identified.  Multiple stones in the gallbladder lumen. No intrahepatic or extrahepatic biliary ductal dilatation. Pancreas: Unremarkable Spleen: Unchanged 1 cm low-attenuation lesion in the spleen (image 64; series 2). Adrenals/Urinary Tract: Normal adrenal glands. Kidneys enhance symmetrically with contrast. Kidneys are lobular bilaterally, suggestive of prior scarring. No hydronephrosis. Urinary bladder is unremarkable. Stomach/Bowel: Normal morphology of the stomach. No evidence for bowel obstruction. Sigmoid colonic diverticulosis without evidence for acute diverticulitis. Vascular/Lymphatic: Normal caliber abdominal aorta. Peripheral calcified atherosclerotic plaque. No retroperitoneal lymphadenopathy. Sequelae of portal venous hypertension including recanalized paraumbilical vein and paraesophageal varices. Reproductive: Prior hysterectomy.  Other: None. Musculoskeletal: No aggressive or acute appearing osseous lesions. Lumbar spine degenerative changes. IMPRESSION: 1. Patient status post right upper lobectomy. No findings to suggest recurrent or metastatic disease. 2. Unchanged ground-glass nodule within the left lung apex. Recommend attention on follow-up. 3. Morphologic changes to the liver compatible with cirrhosis. Portal venous hypertension with paraesophageal varices. 4. Cholelithiasis. Electronically Signed   By: Lovey Newcomer M.D.   On: 11/22/2018 16:16   Ct Abdomen Pelvis W Contrast  Result Date: 11/22/2018 CLINICAL DATA:  Patient with history of lung cancer. Follow-up exam. EXAM: CT CHEST, ABDOMEN, AND PELVIS WITH CONTRAST TECHNIQUE: Multidetector CT imaging of the chest, abdomen and pelvis was performed following the standard protocol during bolus administration of intravenous contrast. CONTRAST:  130mL OMNIPAQUE IOHEXOL 300 MG/ML  SOLN COMPARISON:  CT CAP 08/27/2018. FINDINGS: CT CHEST FINDINGS Cardiovascular: Right anterior chest wall Port-A-Cath is present with tip terminating in the superior vena cava. Left anterior chest wall multi lead pacer device. Normal heart size. Trace fluid superior pericardial recess. Thoracic aortic vascular calcifications. Mediastinum/Nodes: Postsurgical changes right axilla. No enlarged axillary, mediastinal or hilar lymphadenopathy. Small hiatal hernia. Lungs/Pleura: Postsurgical changes compatible with right upper lobectomy. Unchanged right lower lobe paramediastinal fibrosis, likely post radiation changes. Unchanged 8 mm ground-glass nodule left lung apex (image 24; series 4). No pleural effusion or pneumothorax. Unchanged small nodularity within the lingula (image 56; series 4). Musculoskeletal: Thoracic spine degenerative changes. No aggressive or acute appearing osseous lesions. CT ABDOMEN PELVIS FINDINGS Hepatobiliary: Cirrhotic morphology of the liver. No focal hepatic lesion identified. Multiple  stones in the gallbladder lumen. No intrahepatic or extrahepatic biliary ductal dilatation. Pancreas: Unremarkable Spleen: Unchanged 1 cm low-attenuation lesion in the spleen (image 64; series 2). Adrenals/Urinary Tract: Normal adrenal glands. Kidneys enhance symmetrically with contrast. Kidneys are lobular bilaterally, suggestive of prior scarring. No hydronephrosis. Urinary bladder is unremarkable. Stomach/Bowel: Normal morphology of the stomach. No evidence for bowel obstruction. Sigmoid colonic diverticulosis without evidence for acute diverticulitis. Vascular/Lymphatic: Normal caliber abdominal aorta. Peripheral calcified atherosclerotic plaque. No retroperitoneal lymphadenopathy. Sequelae of portal venous hypertension including recanalized paraumbilical vein and paraesophageal varices. Reproductive: Prior hysterectomy. Other: None. Musculoskeletal: No aggressive or acute appearing osseous lesions. Lumbar spine degenerative changes. IMPRESSION: 1. Patient status post right upper lobectomy. No findings to suggest recurrent or metastatic disease. 2. Unchanged ground-glass nodule within the left lung apex. Recommend attention on follow-up. 3. Morphologic changes to the liver compatible with cirrhosis. Portal venous hypertension with paraesophageal varices. 4. Cholelithiasis. Electronically Signed   By: Lovey Newcomer M.D.   On: 11/22/2018 16:16   ASSESSMENT AND PLAN:  This is a very pleasant 73 years old white female with metastatic non-small cell lung cancer, adenocarcinoma with no actionable mutations status post systemic chemotherapy with carboplatin and Alimta and she is currently undergoing treatment with second line immunotherapy with Nivolumab status post 6 cycles. This was followed by immunotherapy with Nivolumab 480 mg IV  every 4 weeks status post 22 cycles. She has been tolerating this treatment well with no concerning adverse effects. The patient had repeat CT scan of the chest, abdomen and pelvis  performed recently.  I personally and independently reviewed the scans and discussed the results with the patient today. Her scan showed no concerning findings for disease progression. I recommended for her to continue her current treatment with nivolumab and she will proceed with cycle #23 today. She will come back for follow-up visit in 4 weeks for evaluation before the next cycle of her treatment. For the cardiac arrhythmia she is followed by cardiology and expected to have intervention in March. The patient will continue her current medication for hypertension. The patient was advised to call immediately if she has any concerning symptoms in the interval.  The patient voices understanding of current disease status and treatment options and is in agreement with the current care plan. All questions were answered. The patient knows to call the clinic with any problems, questions or concerns. We can certainly see the patient much sooner if necessary.  Disclaimer: This note was dictated with voice recognition software. Similar sounding words can inadvertently be transcribed and may not be corrected upon review.

## 2018-11-23 NOTE — Patient Instructions (Signed)
[facility] Discharge Instructions for Patients Receiving Chemotherapy  Today you received the following chemotherapy agents:  Nivolumab  To help prevent nausea and vomiting after your treatment, we encourage you to take your nausea medication as prescribed.   If you develop nausea and vomiting that is not controlled by your nausea medication, call the clinic.   BELOW ARE SYMPTOMS THAT SHOULD BE REPORTED IMMEDIATELY:  *FEVER GREATER THAN 100.5 F  *CHILLS WITH OR WITHOUT FEVER  NAUSEA AND VOMITING THAT IS NOT CONTROLLED WITH YOUR NAUSEA MEDICATION  *UNUSUAL SHORTNESS OF BREATH  *UNUSUAL BRUISING OR BLEEDING  TENDERNESS IN MOUTH AND THROAT WITH OR WITHOUT PRESENCE OF ULCERS  *URINARY PROBLEMS  *BOWEL PROBLEMS  UNUSUAL RASH Items with * indicate a potential emergency and should be followed up as soon as possible.  Feel free to call the clinic should you have any questions or concerns. The clinic phone number is [phone].  Please show the CHEMO ALERT CARD at check-in to the Emergency Department and triage nurse.   

## 2018-11-29 ENCOUNTER — Ambulatory Visit (HOSPITAL_COMMUNITY): Payer: Medicare Other | Admitting: Nurse Practitioner

## 2018-12-21 ENCOUNTER — Inpatient Hospital Stay (HOSPITAL_BASED_OUTPATIENT_CLINIC_OR_DEPARTMENT_OTHER): Payer: Medicare Other | Admitting: Internal Medicine

## 2018-12-21 ENCOUNTER — Telehealth: Payer: Self-pay | Admitting: Internal Medicine

## 2018-12-21 ENCOUNTER — Inpatient Hospital Stay: Payer: Medicare Other | Attending: Internal Medicine

## 2018-12-21 ENCOUNTER — Inpatient Hospital Stay: Payer: Medicare Other

## 2018-12-21 ENCOUNTER — Other Ambulatory Visit: Payer: Self-pay

## 2018-12-21 ENCOUNTER — Encounter: Payer: Self-pay | Admitting: Internal Medicine

## 2018-12-21 VITALS — BP 153/65 | HR 86 | Temp 98.0°F | Resp 20 | Ht 62.0 in | Wt 150.8 lb

## 2018-12-21 DIAGNOSIS — I10 Essential (primary) hypertension: Secondary | ICD-10-CM | POA: Diagnosis not present

## 2018-12-21 DIAGNOSIS — Z5112 Encounter for antineoplastic immunotherapy: Secondary | ICD-10-CM

## 2018-12-21 DIAGNOSIS — I499 Cardiac arrhythmia, unspecified: Secondary | ICD-10-CM | POA: Diagnosis not present

## 2018-12-21 DIAGNOSIS — I480 Paroxysmal atrial fibrillation: Secondary | ICD-10-CM

## 2018-12-21 DIAGNOSIS — C773 Secondary and unspecified malignant neoplasm of axilla and upper limb lymph nodes: Secondary | ICD-10-CM | POA: Insufficient documentation

## 2018-12-21 DIAGNOSIS — C3411 Malignant neoplasm of upper lobe, right bronchus or lung: Secondary | ICD-10-CM | POA: Diagnosis present

## 2018-12-21 DIAGNOSIS — C3490 Malignant neoplasm of unspecified part of unspecified bronchus or lung: Secondary | ICD-10-CM

## 2018-12-21 DIAGNOSIS — C3491 Malignant neoplasm of unspecified part of right bronchus or lung: Secondary | ICD-10-CM

## 2018-12-21 DIAGNOSIS — Z95828 Presence of other vascular implants and grafts: Secondary | ICD-10-CM

## 2018-12-21 DIAGNOSIS — Z79899 Other long term (current) drug therapy: Secondary | ICD-10-CM

## 2018-12-21 LAB — CBC WITH DIFFERENTIAL/PLATELET
Abs Immature Granulocytes: 0.01 10*3/uL (ref 0.00–0.07)
Basophils Absolute: 0 10*3/uL (ref 0.0–0.1)
Basophils Relative: 1 %
Eosinophils Absolute: 0.2 10*3/uL (ref 0.0–0.5)
Eosinophils Relative: 3 %
HCT: 39.9 % (ref 36.0–46.0)
Hemoglobin: 13.6 g/dL (ref 12.0–15.0)
Immature Granulocytes: 0 %
Lymphocytes Relative: 23 %
Lymphs Abs: 1.2 10*3/uL (ref 0.7–4.0)
MCH: 32.8 pg (ref 26.0–34.0)
MCHC: 34.1 g/dL (ref 30.0–36.0)
MCV: 96.1 fL (ref 80.0–100.0)
Monocytes Absolute: 0.4 10*3/uL (ref 0.1–1.0)
Monocytes Relative: 8 %
Neutro Abs: 3.5 10*3/uL (ref 1.7–7.7)
Neutrophils Relative %: 65 %
Platelets: 182 10*3/uL (ref 150–400)
RBC: 4.15 MIL/uL (ref 3.87–5.11)
RDW: 13.2 % (ref 11.5–15.5)
WBC: 5.4 10*3/uL (ref 4.0–10.5)
nRBC: 0 % (ref 0.0–0.2)

## 2018-12-21 LAB — BASIC METABOLIC PANEL
Anion gap: 7 (ref 5–15)
BUN: 8 mg/dL (ref 8–23)
CO2: 27 mmol/L (ref 22–32)
Calcium: 9.4 mg/dL (ref 8.9–10.3)
Chloride: 106 mmol/L (ref 98–111)
Creatinine, Ser: 0.77 mg/dL (ref 0.44–1.00)
GFR calc Af Amer: >60 mL/min (ref >60)
GFR calc non Af Amer: >60 mL/min (ref >60)
Glucose, Bld: 99 mg/dL (ref 70–99)
Potassium: 4.4 mmol/L (ref 3.5–5.1)
Sodium: 140 mmol/L (ref 135–145)

## 2018-12-21 MED ORDER — SODIUM CHLORIDE 0.9 % IV SOLN
480.0000 mg | Freq: Once | INTRAVENOUS | Status: AC
  Administered 2018-12-21: 12:00:00 480 mg via INTRAVENOUS
  Filled 2018-12-21: qty 48

## 2018-12-21 MED ORDER — SODIUM CHLORIDE 0.9% FLUSH
10.0000 mL | INTRAVENOUS | Status: DC | PRN
  Administered 2018-12-21: 10 mL
  Filled 2018-12-21: qty 10

## 2018-12-21 MED ORDER — HEPARIN SOD (PORK) LOCK FLUSH 100 UNIT/ML IV SOLN
500.0000 [IU] | Freq: Once | INTRAVENOUS | Status: AC | PRN
  Administered 2018-12-21: 500 [IU]
  Filled 2018-12-21: qty 5

## 2018-12-21 MED ORDER — SODIUM CHLORIDE 0.9 % IV SOLN
Freq: Once | INTRAVENOUS | Status: AC
  Administered 2018-12-21: 11:00:00 via INTRAVENOUS
  Filled 2018-12-21: qty 250

## 2018-12-21 MED ORDER — SODIUM CHLORIDE 0.9% FLUSH
10.0000 mL | INTRAVENOUS | Status: DC | PRN
  Administered 2018-12-21: 10 mL via INTRAVENOUS
  Filled 2018-12-21: qty 10

## 2018-12-21 NOTE — Patient Instructions (Signed)
[facility] Discharge Instructions for Patients Receiving Chemotherapy  Today you received the following chemotherapy agents Opdivo  To help prevent nausea and vomiting after your treatment, we encourage you to take your nausea medication as directed   If you develop nausea and vomiting that is not controlled by your nausea medication, call the clinic.   BELOW ARE SYMPTOMS THAT SHOULD BE REPORTED IMMEDIATELY:  *FEVER GREATER THAN 100.5 F  *CHILLS WITH OR WITHOUT FEVER  NAUSEA AND VOMITING THAT IS NOT CONTROLLED WITH YOUR NAUSEA MEDICATION  *UNUSUAL SHORTNESS OF BREATH  *UNUSUAL BRUISING OR BLEEDING  TENDERNESS IN MOUTH AND THROAT WITH OR WITHOUT PRESENCE OF ULCERS  *URINARY PROBLEMS  *BOWEL PROBLEMS  UNUSUAL RASH Items with * indicate a potential emergency and should be followed up as soon as possible.  Feel free to call the clinic should you have any questions or concerns. The clinic phone number is [phone].  Please show the CHEMO ALERT CARD at check-in to the Emergency Department and triage nurse.   

## 2018-12-21 NOTE — Telephone Encounter (Signed)
Scheduled appt per 5/19 los - added another cycle- pt to get an updated schedule next visit.

## 2018-12-21 NOTE — Progress Notes (Signed)
[facility] [phone]   [phone]  OFFICE PROGRESS NOTE  Harris, [name], [name] [address] [place] [id number]  DIAGNOSIS: Metastatic non-small cell lung cancer, adenocarcinoma. This was initially diagnosed as stage IB (T2a, N0, M0) in March of 2014, status post right upper lobectomy with lymph node dissection but the patient has evidence for disease recurrence in the right axilla status post resection.  PRIOR THERAPY:  1) Status post right axillary lymph node biopsy on 05/01/2014 under the care of Dr. Roxan Hockey. 2) Systemic chemotherapy with carboplatin for AUC of 5 and Alimta 500 mg/M2 every 3 weeks. First cycle 05/31/2014. Status post 6 cycles. Last dose was given 09/22/2014. 3) palliative radiotherapy to the subcarinal lymph node under the care of Dr. Sondra Come. 4) Nivolumab 240 mg IV every 2 weeks. First dose 12/23/2016. Status post 6 cycles.  CURRENT THERAPY:  Nivolumab 480 mg IV every 4 weeks. First dose 03/17/2017. Status post 23 cycles.  INTERVAL HISTORY: Nancy Hodges 73 y.o. female returns to the clinic today for follow-up visit.  The patient is feeling fine today with no concerning complaints.  She denied having any chest pain, shortness of breath, cough or hemoptysis.  She denied having any fever or chills.  She has no nausea, vomiting, diarrhea or constipation.  She denied having any headache or visual changes.  She continues to tolerate her treatment with nivolumab fairly well.  She is here today for evaluation before starting cycle #24.  MEDICAL HISTORY: Past Medical History:  Diagnosis Date   Allergic rhinitis    Anxiety    Arthritis    Asthma    Atrial flutter ([facility])    Cholelithiases 09/26/2015   Diverticulosis    Encounter for antineoplastic immunotherapy 12/09/2016   GERD (gastroesophageal reflux disease)    H/O hiatal hernia    History of blood transfusion    History of chemotherapy    History  of kidney stones    History of radiation therapy    Internal hemorrhoid    Lung cancer ([facility])    a. Stage IB non-small cell carcinoma, s/p R VATS, wedge resection, RU lobectomy 10/2012.   Paroxysmal atrial fibrillation ([facility])    a. 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    ALLERGIES:  is allergic to chlorpheniramine-dm; prednisone; septra [sulfamethoxazole-trimethoprim]; vioxx [rofecoxib]; and penicillins.  MEDICATIONS:  Current Outpatient Medications  Medication Sig Dispense Refill   acetaminophen (TYLENOL) 500 MG tablet Take 500 mg by mouth 3 (three) times daily as needed for moderate pain or fever.      albuterol (PROVENTIL HFA;VENTOLIN HFA) 108 (90 BASE) MCG/ACT inhaler Inhale 2 puffs into the lungs every 6 (six) hours as needed for wheezing or shortness of breath.      azelastine (ASTELIN) 0.1 % nasal spray Place 1 spray into both nostrils 2 (two) times daily. Use in each nostril as directed     calcium carbonate (TUMS - DOSED IN MG ELEMENTAL CALCIUM) 500 MG chewable tablet Chew 1 tablet by mouth 2 (two) times daily as needed for indigestion or heartburn.      CARTIA XT 240 MG 24 hr capsule TAKE 1 CAPSULE(240 MG) BY MOUTH DAILY (Patient taking differently: Take 240 mg by mouth daily. ) 90 capsule 3   dimenhyDRINATE (  DRAMAMINE) 50 MG tablet Take 12.5 mg by mouth every 8 (eight) hours as needed for dizziness.     ipratropium-albuterol (DUONEB) 0.5-2.5 (3) MG/3ML SOLN Take 3 mLs by nebulization every 6 (six) hours as needed (shortness of breath or wheezing).      lidocaine-prilocaine (EMLA) cream Apply 1 application topically as needed. (Patient taking differently: Apply 1 application topically as needed (for port). ) 30 g 4   loratadine (CLARITIN) 10 MG tablet Take 10 mg by mouth daily.      metoprolol tartrate (LOPRESSOR) 100 MG tablet Take 1 tablet (100 mg total) by mouth once for 1 dose. Take one tablet 2 hours prior to cardiac CT 1 tablet 0   ondansetron (ZOFRAN ODT) 4 MG disintegrating tablet Take 1 tablet (4 mg total) by mouth every 8 (eight) hours as needed for nausea or vomiting. 20 tablet 0   OVER THE COUNTER MEDICATION Take 5 mLs by mouth daily as needed (cough). [employer] kids cough syrup     Polyvinyl Alcohol-Povidone (REFRESH OP) Place 1 drop into both eyes daily as needed (dry eyes).     XARELTO 20 MG TABS tablet TAKE 1 TABLET(20 MG) BY MOUTH DAILY WITH SUPPER 30 tablet 5   No current facility-administered medications for this visit.     SURGICAL HISTORY:  Past Surgical History:  Procedure Laterality Date   CARDIOVERSION N/A 02/19/2018   Procedure: CARDIOVERSION;  Surgeon: Sanda Klein, MD;  Location: [facility];  Service: Cardiovascular;  Laterality: N/A;   CARDIOVERSION N/A 04/30/2018   Procedure: CARDIOVERSION;  Surgeon: Thayer Headings, MD;  Location: [facility];  Service: Cardiovascular;  Laterality: N/A;   CATARACT EXTRACTION W/PHACO Right 08/14/2016   Procedure: CATARACT EXTRACTION PHACO AND INTRAOCULAR LENS PLACEMENT RIGHT EYE CDE 10.53;  Surgeon: Tonny Branch, MD;  Location: [facility];  Service: Ophthalmology;  Laterality: Right;  right   CATARACT EXTRACTION W/PHACO Left 09/25/2016   Procedure: CATARACT EXTRACTION PHACO AND INTRAOCULAR LENS PLACEMENT (IOC);  Surgeon: Tonny Branch, MD;  Location: [facility];  Service: Ophthalmology;  Laterality: Left;  left CDE 8.81   COLONOSCOPY     [facility]: cattered sigmoid diverticula, internal hemorrhoids, sessile polyp in ascending and mid transverse colon (tubular adenoma).    colonoscopy with polypectomy  2015   ESOPHAGOGASTRODUODENOSCOPY     [facility]: bile reflux in stomach, mild gastritis, hiatal hernia   ESOPHAGOGASTRODUODENOSCOPY N/A 01/31/2016   Procedure: ESOPHAGOGASTRODUODENOSCOPY (EGD);  Surgeon: [name], MD;  Location: [facility];  Service: Endoscopy;  Laterality: N/A;  1200   IR FLUORO GUIDE PORT INSERTION RIGHT  06/30/2017   IR US GUIDE VASC ACCESS RIGHT  06/30/2017   LOBECTOMY Right 10/27/2012   Procedure: LOBECTOMY;  Surgeon: [name], MD;  Location: [facility];  Service: Thoracic;  Laterality: Right;   RIGHT UPPER LOBECTOMY & Node Dissection   LYMPH NODE BIOPSY Right 05/01/2014   Procedure: LYMPH NODE BIOPSY, Right Axillary;  Surgeon: [name], MD;  Location: [facility];  Service: Thoracic;  Laterality: Right;   PACEMAKER IMPLANT N/A 10/30/2017   SJM Assurity MRI PPM implanted by Dr [name] once leadless pacemaker battery depleted   PARTIAL HYSTERECTOMY     PERMANENT PACEMAKER INSERTION N/A 09/14/2012   Nanostim (SJM) leadless pacemaker (LEADLESS II STUDY PATIENT) implanted by Dr [name], no longer functioning, device abandoned.   VIDEO ASSISTED THORACOSCOPY (VATS)/WEDGE RESECTION Right 10/27/2012   Procedure: VIDEO ASSISTED THORACOSCOPY (VATS),RGHT UPPER LOBE LUNG WEDGE RESECTION;  Surgeon: [name], MD;  [facility]  OR;  Service: Thoracic;  Laterality: Right;    REVIEW OF SYSTEMS:  A comprehensive review of systems was negative.   PHYSICAL EXAMINATION: General appearance: alert, cooperative and no distress Head: Normocephalic, without obvious abnormality, atraumatic Neck: no adenopathy, no JVD, supple, symmetrical, trachea midline and thyroid not enlarged, symmetric, no tenderness/mass/nodules Lymph nodes: Cervical, supraclavicular, and axillary nodes normal. Resp: clear to auscultation bilaterally Back: symmetric, no curvature. ROM normal. No CVA tenderness. Cardio: regular rate and rhythm, S1, S2 normal, no murmur, click, rub or gallop GI: soft, non-tender; bowel sounds normal; no masses,  no organomegaly Extremities: extremities normal, atraumatic, no cyanosis or edema  ECOG PERFORMANCE STATUS: 1 - Symptomatic but completely  ambulatory  Blood pressure (!) 153/65, pulse 86, temperature 98 F (36.7 C), temperature source Oral, resp. rate 20, height 5\' 2"  (1.575 m), weight 150 lb 12.8 oz (68.4 kg), SpO2 97 %.  LABORATORY DATA: Lab Results  Component Value Date   WBC 5.4 12/21/2018   HGB 13.6 12/21/2018   HCT 39.9 12/21/2018   MCV 96.1 12/21/2018   PLT 182 12/21/2018      Chemistry      Component Value Date/Time   NA 140 12/21/2018 0948   NA 139 08/05/2017 0902   K 4.4 12/21/2018 0948   K 4.1 08/05/2017 0902   CL 106 12/21/2018 0948   CO2 27 12/21/2018 0948   CO2 28 08/05/2017 0902   BUN 8 12/21/2018 0948   BUN 10.1 08/05/2017 0902   CREATININE 0.77 12/21/2018 0948   CREATININE 0.72 11/23/2018 0952   CREATININE 0.8 08/05/2017 0902   GLU 134 (H) 02/18/2017 1410      Component Value Date/Time   CALCIUM 9.4 12/21/2018 0948   CALCIUM 9.5 08/05/2017 0902   ALKPHOS 116 11/23/2018 0952   ALKPHOS 111 08/05/2017 0902   AST 42 (H) 11/23/2018 0952   AST 33 08/05/2017 0902   ALT 40 11/23/2018 0952   ALT 34 08/05/2017 0902   BILITOT 1.0 11/23/2018 0952   BILITOT 1.39 (H) 08/05/2017 0902       RADIOGRAPHIC STUDIES: Ct Chest W Contrast  Result Date: 11/22/2018 CLINICAL DATA:  Patient with history of lung cancer. Follow-up exam. EXAM: CT CHEST, ABDOMEN, AND PELVIS WITH CONTRAST TECHNIQUE: Multidetector CT imaging of the chest, abdomen and pelvis was performed following the standard protocol during bolus administration of intravenous contrast. CONTRAST:  1109mL OMNIPAQUE IOHEXOL 300 MG/ML  SOLN COMPARISON:  CT CAP 08/27/2018. FINDINGS: CT CHEST FINDINGS Cardiovascular: Right anterior chest wall Port-A-Cath is present with tip terminating in the superior vena cava. Left anterior chest wall multi lead pacer device. Normal heart size. Trace fluid superior pericardial recess. Thoracic aortic vascular calcifications. Mediastinum/Nodes: Postsurgical changes right axilla. No enlarged axillary, mediastinal or hilar  lymphadenopathy. Small hiatal hernia. Lungs/Pleura: Postsurgical changes compatible with right upper lobectomy. Unchanged right lower lobe paramediastinal fibrosis, likely post radiation changes. Unchanged 8 mm ground-glass nodule left lung apex (image 24; series 4). No pleural effusion or pneumothorax. Unchanged small nodularity within the lingula (image 56; series 4). Musculoskeletal: Thoracic spine degenerative changes. No aggressive or acute appearing osseous lesions. CT ABDOMEN PELVIS FINDINGS Hepatobiliary: Cirrhotic morphology of the liver. No focal hepatic lesion identified. Multiple stones in the gallbladder lumen. No intrahepatic or extrahepatic biliary ductal dilatation. Pancreas: Unremarkable Spleen: Unchanged 1 cm low-attenuation lesion in the spleen (image 64; series 2). Adrenals/Urinary Tract: Normal adrenal glands. Kidneys enhance symmetrically with contrast. Kidneys are lobular bilaterally, suggestive of prior scarring. No hydronephrosis. Urinary bladder is  unremarkable. Stomach/Bowel: Normal morphology of the stomach. No evidence for bowel obstruction. Sigmoid colonic diverticulosis without evidence for acute diverticulitis. Vascular/Lymphatic: Normal caliber abdominal aorta. Peripheral calcified atherosclerotic plaque. No retroperitoneal lymphadenopathy. Sequelae of portal venous hypertension including recanalized paraumbilical vein and paraesophageal varices. Reproductive: Prior hysterectomy. Other: None. Musculoskeletal: No aggressive or acute appearing osseous lesions. Lumbar spine degenerative changes. IMPRESSION: 1. Patient status post right upper lobectomy. No findings to suggest recurrent or metastatic disease. 2. Unchanged ground-glass nodule within the left lung apex. Recommend attention on follow-up. 3. Morphologic changes to the liver compatible with cirrhosis. Portal venous hypertension with paraesophageal varices. 4. Cholelithiasis. Electronically Signed   By: Lovey Newcomer M.D.   On:  11/22/2018 16:16   Ct Abdomen Pelvis W Contrast  Result Date: 11/22/2018 CLINICAL DATA:  Patient with history of lung cancer. Follow-up exam. EXAM: CT CHEST, ABDOMEN, AND PELVIS WITH CONTRAST TECHNIQUE: Multidetector CT imaging of the chest, abdomen and pelvis was performed following the standard protocol during bolus administration of intravenous contrast. CONTRAST:  118mL OMNIPAQUE IOHEXOL 300 MG/ML  SOLN COMPARISON:  CT CAP 08/27/2018. FINDINGS: CT CHEST FINDINGS Cardiovascular: Right anterior chest wall Port-A-Cath is present with tip terminating in the superior vena cava. Left anterior chest wall multi lead pacer device. Normal heart size. Trace fluid superior pericardial recess. Thoracic aortic vascular calcifications. Mediastinum/Nodes: Postsurgical changes right axilla. No enlarged axillary, mediastinal or hilar lymphadenopathy. Small hiatal hernia. Lungs/Pleura: Postsurgical changes compatible with right upper lobectomy. Unchanged right lower lobe paramediastinal fibrosis, likely post radiation changes. Unchanged 8 mm ground-glass nodule left lung apex (image 24; series 4). No pleural effusion or pneumothorax. Unchanged small nodularity within the lingula (image 56; series 4). Musculoskeletal: Thoracic spine degenerative changes. No aggressive or acute appearing osseous lesions. CT ABDOMEN PELVIS FINDINGS Hepatobiliary: Cirrhotic morphology of the liver. No focal hepatic lesion identified. Multiple stones in the gallbladder lumen. No intrahepatic or extrahepatic biliary ductal dilatation. Pancreas: Unremarkable Spleen: Unchanged 1 cm low-attenuation lesion in the spleen (image 64; series 2). Adrenals/Urinary Tract: Normal adrenal glands. Kidneys enhance symmetrically with contrast. Kidneys are lobular bilaterally, suggestive of prior scarring. No hydronephrosis. Urinary bladder is unremarkable. Stomach/Bowel: Normal morphology of the stomach. No evidence for bowel obstruction. Sigmoid colonic  diverticulosis without evidence for acute diverticulitis. Vascular/Lymphatic: Normal caliber abdominal aorta. Peripheral calcified atherosclerotic plaque. No retroperitoneal lymphadenopathy. Sequelae of portal venous hypertension including recanalized paraumbilical vein and paraesophageal varices. Reproductive: Prior hysterectomy. Other: None. Musculoskeletal: No aggressive or acute appearing osseous lesions. Lumbar spine degenerative changes. IMPRESSION: 1. Patient status post right upper lobectomy. No findings to suggest recurrent or metastatic disease. 2. Unchanged ground-glass nodule within the left lung apex. Recommend attention on follow-up. 3. Morphologic changes to the liver compatible with cirrhosis. Portal venous hypertension with paraesophageal varices. 4. Cholelithiasis. Electronically Signed   By: Lovey Newcomer M.D.   On: 11/22/2018 16:16   ASSESSMENT AND PLAN:  This is a very pleasant 73 years old white female with metastatic non-small cell lung cancer, adenocarcinoma with no actionable mutations status post systemic chemotherapy with carboplatin and Alimta and she is currently undergoing treatment with second line immunotherapy with Nivolumab status post 6 cycles. This was followed by immunotherapy with Nivolumab 480 mg IV every 4 weeks status post 23 cycles. The patient continues to tolerate her treatment well with no concerning adverse effects. I recommended for her to proceed with cycle #24 today as planned. For the cardiac arrhythmia she is followed by cardiology.. The patient will continue her current medication for hypertension. She  will come back for follow-up visit in 4 weeks for evaluation before the next cycle of her treatment. She was advised to call immediately if she has any concerning symptoms in the interval. The patient voices understanding of current disease status and treatment options and is in agreement with the current care plan. All questions were answered. The patient  knows to call the clinic with any problems, questions or concerns. We can certainly see the patient much sooner if necessary.  Disclaimer: This note was dictated with voice recognition software. Similar sounding words can inadvertently be transcribed and may not be corrected upon review.

## 2019-01-17 ENCOUNTER — Other Ambulatory Visit: Payer: Self-pay | Admitting: Medical Oncology

## 2019-01-17 DIAGNOSIS — E032 Hypothyroidism due to medicaments and other exogenous substances: Secondary | ICD-10-CM

## 2019-01-17 DIAGNOSIS — C3490 Malignant neoplasm of unspecified part of unspecified bronchus or lung: Secondary | ICD-10-CM

## 2019-01-18 ENCOUNTER — Encounter: Payer: Self-pay | Admitting: Internal Medicine

## 2019-01-18 ENCOUNTER — Inpatient Hospital Stay: Payer: Medicare Other

## 2019-01-18 ENCOUNTER — Inpatient Hospital Stay: Payer: Medicare Other | Attending: Internal Medicine | Admitting: Internal Medicine

## 2019-01-18 ENCOUNTER — Other Ambulatory Visit: Payer: Self-pay

## 2019-01-18 VITALS — BP 139/54 | HR 72 | Temp 98.5°F | Resp 17 | Ht 62.0 in | Wt 151.7 lb

## 2019-01-18 DIAGNOSIS — Z79899 Other long term (current) drug therapy: Secondary | ICD-10-CM | POA: Insufficient documentation

## 2019-01-18 DIAGNOSIS — C773 Secondary and unspecified malignant neoplasm of axilla and upper limb lymph nodes: Secondary | ICD-10-CM | POA: Diagnosis present

## 2019-01-18 DIAGNOSIS — Z95828 Presence of other vascular implants and grafts: Secondary | ICD-10-CM

## 2019-01-18 DIAGNOSIS — Z5112 Encounter for antineoplastic immunotherapy: Secondary | ICD-10-CM

## 2019-01-18 DIAGNOSIS — C3490 Malignant neoplasm of unspecified part of unspecified bronchus or lung: Secondary | ICD-10-CM

## 2019-01-18 DIAGNOSIS — C3411 Malignant neoplasm of upper lobe, right bronchus or lung: Secondary | ICD-10-CM | POA: Insufficient documentation

## 2019-01-18 DIAGNOSIS — E032 Hypothyroidism due to medicaments and other exogenous substances: Secondary | ICD-10-CM

## 2019-01-18 DIAGNOSIS — Z950 Presence of cardiac pacemaker: Secondary | ICD-10-CM | POA: Diagnosis not present

## 2019-01-18 DIAGNOSIS — Z7901 Long term (current) use of anticoagulants: Secondary | ICD-10-CM | POA: Diagnosis not present

## 2019-01-18 DIAGNOSIS — C3491 Malignant neoplasm of unspecified part of right bronchus or lung: Secondary | ICD-10-CM

## 2019-01-18 DIAGNOSIS — I4892 Unspecified atrial flutter: Secondary | ICD-10-CM | POA: Insufficient documentation

## 2019-01-18 LAB — CBC WITH DIFFERENTIAL (CANCER CENTER ONLY)
Abs Immature Granulocytes: 0.02 10*3/uL (ref 0.00–0.07)
Basophils Absolute: 0 10*3/uL (ref 0.0–0.1)
Basophils Relative: 1 %
Eosinophils Absolute: 0.2 10*3/uL (ref 0.0–0.5)
Eosinophils Relative: 3 %
HCT: 38.8 % (ref 36.0–46.0)
Hemoglobin: 13.5 g/dL (ref 12.0–15.0)
Immature Granulocytes: 0 %
Lymphocytes Relative: 22 %
Lymphs Abs: 1.3 10*3/uL (ref 0.7–4.0)
MCH: 33.3 pg (ref 26.0–34.0)
MCHC: 34.8 g/dL (ref 30.0–36.0)
MCV: 95.6 fL (ref 80.0–100.0)
Monocytes Absolute: 0.5 10*3/uL (ref 0.1–1.0)
Monocytes Relative: 8 %
Neutro Abs: 4.1 10*3/uL (ref 1.7–7.7)
Neutrophils Relative %: 66 %
Platelet Count: 166 10*3/uL (ref 150–400)
RBC: 4.06 MIL/uL (ref 3.87–5.11)
RDW: 13.2 % (ref 11.5–15.5)
WBC Count: 6.1 10*3/uL (ref 4.0–10.5)
nRBC: 0 % (ref 0.0–0.2)

## 2019-01-18 LAB — CMP (CANCER CENTER ONLY)
ALT: 36 U/L (ref 0–44)
AST: 38 U/L (ref 15–41)
Albumin: 3.8 g/dL (ref 3.5–5.0)
Alkaline Phosphatase: 109 U/L (ref 38–126)
Anion gap: 10 (ref 5–15)
BUN: 13 mg/dL (ref 8–23)
CO2: 26 mmol/L (ref 22–32)
Calcium: 9.6 mg/dL (ref 8.9–10.3)
Chloride: 105 mmol/L (ref 98–111)
Creatinine: 0.78 mg/dL (ref 0.44–1.00)
GFR, Est AFR Am: >60 mL/min (ref >60)
GFR, Estimated: >60 mL/min (ref >60)
Glucose, Bld: 114 mg/dL — ABNORMAL HIGH (ref 70–99)
Potassium: 4.3 mmol/L (ref 3.5–5.1)
Sodium: 141 mmol/L (ref 135–145)
Total Bilirubin: 1 mg/dL (ref 0.3–1.2)
Total Protein: 7 g/dL (ref 6.5–8.1)

## 2019-01-18 LAB — TSH: TSH: 3.453 u[IU]/mL (ref 0.308–3.960)

## 2019-01-18 MED ORDER — HEPARIN SOD (PORK) LOCK FLUSH 100 UNIT/ML IV SOLN
500.0000 [IU] | Freq: Once | INTRAVENOUS | Status: AC | PRN
  Administered 2019-01-18: 500 [IU]
  Filled 2019-01-18: qty 5

## 2019-01-18 MED ORDER — SODIUM CHLORIDE 0.9 % IV SOLN
480.0000 mg | Freq: Once | INTRAVENOUS | Status: AC
  Administered 2019-01-18: 480 mg via INTRAVENOUS
  Filled 2019-01-18: qty 48

## 2019-01-18 MED ORDER — SODIUM CHLORIDE 0.9 % IV SOLN
Freq: Once | INTRAVENOUS | Status: AC
  Administered 2019-01-18: 11:00:00 via INTRAVENOUS
  Filled 2019-01-18: qty 250

## 2019-01-18 MED ORDER — SODIUM CHLORIDE 0.9% FLUSH
10.0000 mL | INTRAVENOUS | Status: DC | PRN
  Administered 2019-01-18: 10 mL
  Filled 2019-01-18: qty 10

## 2019-01-18 MED ORDER — SODIUM CHLORIDE 0.9% FLUSH
10.0000 mL | INTRAVENOUS | Status: DC | PRN
  Administered 2019-01-18: 10:00:00 10 mL via INTRAVENOUS
  Filled 2019-01-18: qty 10

## 2019-01-18 NOTE — Patient Instructions (Signed)
[facility] Discharge Instructions for Patients Receiving Chemotherapy  Today you received the following chemotherapy agents Opdivo  To help prevent nausea and vomiting after your treatment, we encourage you to take your nausea medication as directed   If you develop nausea and vomiting that is not controlled by your nausea medication, call the clinic.   BELOW ARE SYMPTOMS THAT SHOULD BE REPORTED IMMEDIATELY:  *FEVER GREATER THAN 100.5 F  *CHILLS WITH OR WITHOUT FEVER  NAUSEA AND VOMITING THAT IS NOT CONTROLLED WITH YOUR NAUSEA MEDICATION  *UNUSUAL SHORTNESS OF BREATH  *UNUSUAL BRUISING OR BLEEDING  TENDERNESS IN MOUTH AND THROAT WITH OR WITHOUT PRESENCE OF ULCERS  *URINARY PROBLEMS  *BOWEL PROBLEMS  UNUSUAL RASH Items with * indicate a potential emergency and should be followed up as soon as possible.  Feel free to call the clinic should you have any questions or concerns. The clinic phone number is [phone].  Please show the CHEMO ALERT CARD at check-in to the Emergency Department and triage nurse.   

## 2019-01-18 NOTE — Progress Notes (Signed)
[place] [phone]   [phone]  OFFICE PROGRESS NOTE  Harris, [name], [name] [address] [place] [address]  DIAGNOSIS: Metastatic non-small cell lung cancer, adenocarcinoma. This was initially diagnosed as stage IB (T2a, N0, M0) in March of 2014, status post right upper lobectomy with lymph node dissection but the patient has evidence for disease recurrence in the right axilla status post resection.  PRIOR THERAPY:  1) Status post right axillary lymph node biopsy on 05/01/2014 under the care of Dr. Roxan Hockey. 2) Systemic chemotherapy with carboplatin for AUC of 5 and Alimta 500 mg/M2 every 3 weeks. First cycle 05/31/2014. Status post 6 cycles. Last dose was given 09/22/2014. 3) palliative radiotherapy to the subcarinal lymph node under the care of Dr. Sondra Come. 4) Nivolumab 240 mg IV every 2 weeks. First dose 12/23/2016. Status post 6 cycles.  CURRENT THERAPY:  Nivolumab 480 mg IV every 4 weeks. First dose 03/17/2017. Status post 24 cycles.  INTERVAL HISTORY: Nancy Hodges 73 y.o. female returns to the clinic today for follow-up visit.  The patient is feeling fine today with no concerning complaints.  She is very active and was working in her yard for several days recently.  She denied having any chest pain, shortness of breath, cough or hemoptysis.  She denied having any fever or chills.  She has no nausea, vomiting, diarrhea or constipation.  She continues to tolerate her treatment with nivolumab fairly well.  She is here today for evaluation before starting cycle #25.  MEDICAL HISTORY: Past Medical History:  Diagnosis Date  . Allergic rhinitis   . Anxiety   . Arthritis   . Asthma   . Atrial flutter ([place])   . Cholelithiases 09/26/2015  . Diverticulosis   . Encounter for antineoplastic immunotherapy 12/09/2016  . GERD (gastroesophageal reflux disease)   . H/O hiatal hernia   . History of blood transfusion   . History of  chemotherapy   . History of kidney stones   . History of radiation therapy   . Internal hemorrhoid   . Lung cancer [place])    a. Stage IB non-small cell carcinoma, s/p R VATS, wedge resection, RU lobectomy 10/2012.  . Paroxysmal atrial fibrillation ([facility])    a. anticoagulated with Xarelto  . Pneumonia 06/2016   [facility]  . Presence of permanent cardiac pacemaker   . Pulmonary nodule   . Tachycardia-bradycardia syndrome ([place])    a. 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    ALLERGIES:  is allergic to chlorpheniramine-dm; prednisone; septra [sulfamethoxazole-trimethoprim]; vioxx [rofecoxib]; and penicillins.  MEDICATIONS:  Current Outpatient Medications  Medication Sig Dispense Refill  . acetaminophen (TYLENOL) 500 MG tablet Take 500 mg by mouth 3 (three) times daily as needed for moderate pain or fever.     [name] albuterol (PROVENTIL HFA;VENTOLIN HFA) 108 (90 BASE) MCG/ACT inhaler Inhale 2 puffs into the lungs every 6 (six) hours as needed for wheezing or shortness of breath.     [name] azelastine (ASTELIN) 0.1 % nasal spray Place 1 spray into both nostrils 2 (two) times daily. Use in each nostril as directed    . calcium carbonate (TUMS - DOSED IN MG ELEMENTAL CALCIUM) 500 MG chewable tablet Chew 1 tablet by mouth 2 (two) times daily as needed for indigestion or heartburn.     [name] CARTIA XT 240 MG 24 hr capsule TAKE 1 CAPSULE(240 MG) BY MOUTH DAILY (Patient taking differently: Take 240 mg by mouth daily. )  90 capsule 3  . dimenhyDRINATE (DRAMAMINE) 50 MG tablet Take 12.5 mg by mouth every 8 (eight) hours as needed for dizziness.    [name] ipratropium-albuterol (DUONEB) 0.5-2.5 (3) MG/3ML SOLN Take 3 mLs by nebulization every 6 (six) hours as needed (shortness of breath or wheezing).     [name] lidocaine-prilocaine (EMLA) cream Apply 1 application topically as needed. (Patient taking differently: Apply 1 application topically as needed (for port). ) 30 g 4  . loratadine (CLARITIN) 10 MG tablet Take 10 mg  by mouth daily.    . metoprolol tartrate (LOPRESSOR) 100 MG tablet Take 1 tablet (100 mg total) by mouth once for 1 dose. Take one tablet 2 hours prior to cardiac CT 1 tablet 0  . ondansetron (ZOFRAN ODT) 4 MG disintegrating tablet Take 1 tablet (4 mg total) by mouth every 8 (eight) hours as needed for nausea or vomiting. 20 tablet 0  . OVER THE COUNTER MEDICATION Take 5 mLs by mouth daily as needed (cough). [employer] kids cough syrup    . Polyvinyl Alcohol-Povidone (REFRESH OP) Place 1 drop into both eyes daily as needed (dry eyes).    [name] tablet TAKE 1 TABLET(20 MG) BY MOUTH DAILY WITH SUPPER 30 tablet 5   No current facility-administered medications for this visit.     SURGICAL HISTORY:  Past Surgical History:  Procedure Laterality Date  . CARDIOVERSION N/A 02/19/2018   Procedure: CARDIOVERSION;  Surgeon: Sanda Klein, MD;  Location: [facility];  Service: Cardiovascular;  Laterality: N/A;  . CARDIOVERSION N/A 04/30/2018   Procedure: CARDIOVERSION;  Surgeon: Thayer Headings, MD;  Location: [facility] ENDOSCOPY;  Service: Cardiovascular;  Laterality: N/A;  . CATARACT EXTRACTION W/PHACO Right 08/14/2016   Procedure: CATARACT EXTRACTION PHACO AND INTRAOCULAR LENS PLACEMENT RIGHT EYE CDE 10.53;  Surgeon: Tonny Branch, MD;  Location: [facility];  Service: Ophthalmology;  Laterality: Right;  right  . CATARACT EXTRACTION W/PHACO Left 09/25/2016   Procedure: CATARACT EXTRACTION PHACO AND INTRAOCULAR LENS PLACEMENT (IOC);  Surgeon: Tonny Branch, MD;  Location: [facility];  Service: Ophthalmology;  Laterality: Left;  left CDE 8.81  . COLONOSCOPY     [facility]: cattered sigmoid diverticula, internal hemorrhoids, sessile polyp in ascending and mid transverse colon (tubular adenoma).   . colonoscopy with polypectomy  2015  . ESOPHAGOGASTRODUODENOSCOPY     [facility]: bile reflux in stomach, mild gastritis, hiatal hernia  . ESOPHAGOGASTRODUODENOSCOPY N/A 01/31/2016   Procedure: ESOPHAGOGASTRODUODENOSCOPY  (EGD);  Surgeon: [name], MD;  Location: [facility];  Service: Endoscopy;  Laterality: N/A;  1200  . IR FLUORO GUIDE PORT INSERTION RIGHT  06/30/2017  . IR US GUIDE VASC ACCESS RIGHT  06/30/2017  . LOBECTOMY Right 10/27/2012   Procedure: LOBECTOMY;  Surgeon: [name], MD;  Location: [facility];  Service: Thoracic;  Laterality: Right;   RIGHT UPPER LOBECTOMY & Node Dissection  . LYMPH NODE BIOPSY Right 05/01/2014   Procedure: LYMPH NODE BIOPSY, Right Axillary;  Surgeon: [name], MD;  Location: [facility];  Service: Thoracic;  Laterality: Right;  . PACEMAKER IMPLANT N/A 10/30/2017   SJM Assurity MRI PPM implanted by Dr [name] once leadless pacemaker battery depleted  . PARTIAL HYSTERECTOMY    . PERMANENT PACEMAKER INSERTION N/A 09/14/2012   Nanostim (SJM) leadless pacemaker (LEADLESS II STUDY PATIENT) implanted by Dr [name], no longer functioning, device abandoned.  [name] VIDEO ASSISTED THORACOSCOPY (VATS)/WEDGE RESECTION Right 10/27/2012   Procedure: VIDEO ASSISTED THORACOSCOPY (VATS),RGHT UPPER LOBE LUNG WEDGE RESECTION;  Surgeon: Remo Lipps  Chaya Jan, MD;  Location: [facility];  Service: Thoracic;  Laterality: Right;    REVIEW OF SYSTEMS:  A comprehensive review of systems was negative.   PHYSICAL EXAMINATION: General appearance: alert, cooperative and no distress Head: Normocephalic, without obvious abnormality, atraumatic Neck: no adenopathy, no JVD, supple, symmetrical, trachea midline and thyroid not enlarged, symmetric, no tenderness/mass/nodules Lymph nodes: Cervical, supraclavicular, and axillary nodes normal. Resp: clear to auscultation bilaterally Back: symmetric, no curvature. ROM normal. No CVA tenderness. Cardio: regular rate and rhythm, S1, S2 normal, no murmur, click, rub or gallop GI: soft, non-tender; bowel sounds normal; no masses,  no organomegaly Extremities: extremities normal, atraumatic, no cyanosis or edema  ECOG PERFORMANCE STATUS: 1 - Symptomatic but  completely ambulatory  Blood pressure (!) 139/54, pulse 72, temperature 98.5 F (36.9 C), temperature source Oral, resp. rate 17, height 5\' 2"  (1.575 m), weight 151 lb 11.2 oz (68.8 kg), SpO2 98 %.  LABORATORY DATA: Lab Results  Component Value Date   WBC 6.1 01/18/2019   HGB 13.5 01/18/2019   HCT 38.8 01/18/2019   MCV 95.6 01/18/2019   PLT 166 01/18/2019      Chemistry      Component Value Date/Time   NA 141 01/18/2019 0952   NA 139 08/05/2017 0902   K 4.3 01/18/2019 0952   K 4.1 08/05/2017 0902   CL 105 01/18/2019 0952   CO2 26 01/18/2019 0952   CO2 28 08/05/2017 0902   BUN 13 01/18/2019 0952   BUN 10.1 08/05/2017 0902   CREATININE 0.78 01/18/2019 0952   CREATININE 0.8 08/05/2017 0902   GLU 134 (H) 02/18/2017 1410      Component Value Date/Time   CALCIUM 9.6 01/18/2019 0952   CALCIUM 9.5 08/05/2017 0902   ALKPHOS 109 01/18/2019 0952   ALKPHOS 111 08/05/2017 0902   AST 38 01/18/2019 0952   AST 33 08/05/2017 0902   ALT 36 01/18/2019 0952   ALT 34 08/05/2017 0902   BILITOT 1.0 01/18/2019 0952   BILITOT 1.39 (H) 08/05/2017 0902       RADIOGRAPHIC STUDIES: No results found. ASSESSMENT AND PLAN:  This is a very pleasant 73 years old white female with metastatic non-small cell lung cancer, adenocarcinoma with no actionable mutations status post systemic chemotherapy with carboplatin and Alimta and she is currently undergoing treatment with second line immunotherapy with Nivolumab status post 6 cycles. This was followed by immunotherapy with Nivolumab 480 mg IV every 4 weeks status post 24 cycles. The patient continues to tolerate her treatment well with no concerning adverse effects. I recommended for her to proceed with cycle 25 today as planned. I will see the patient back for follow-up visit in 4 weeks for evaluation after repeating CT scan of the chest, abdomen and pelvis for restaging of her disease. She was advised to call immediately if she has any concerning  symptoms in the interval. The patient voices understanding of current disease status and treatment options and is in agreement with the current care plan. All questions were answered. The patient knows to call the clinic with any problems, questions or concerns. We can certainly see the patient much sooner if necessary.  Disclaimer: This note was dictated with voice recognition software. Similar sounding words can inadvertently be transcribed and may not be corrected upon review.

## 2019-01-20 ENCOUNTER — Telehealth: Payer: Self-pay | Admitting: Internal Medicine

## 2019-01-20 NOTE — Telephone Encounter (Signed)
Scheduled per los. Mailed printout  °

## 2019-01-25 ENCOUNTER — Encounter: Payer: Self-pay | Admitting: Gastroenterology

## 2019-01-27 ENCOUNTER — Telehealth: Payer: Self-pay | Admitting: Internal Medicine

## 2019-01-27 ENCOUNTER — Telehealth: Payer: Self-pay | Admitting: Medical Oncology

## 2019-01-27 NOTE — Telephone Encounter (Signed)
R/s appt per 6/25 sch message - unable to reach pt . Left message with appt date and time

## 2019-01-27 NOTE — Telephone Encounter (Signed)
Scheduling conflict . She does not need labs prior to CT because last labs were within a month of next CT scan . Transportation problem -needs appts to start 930 or later. Schedule message sent.

## 2019-01-31 ENCOUNTER — Encounter: Payer: Medicare Other | Admitting: Internal Medicine

## 2019-01-31 ENCOUNTER — Ambulatory Visit (INDEPENDENT_AMBULATORY_CARE_PROVIDER_SITE_OTHER): Payer: Medicare Other | Admitting: *Deleted

## 2019-01-31 DIAGNOSIS — I495 Sick sinus syndrome: Secondary | ICD-10-CM | POA: Diagnosis not present

## 2019-01-31 DIAGNOSIS — I4819 Other persistent atrial fibrillation: Secondary | ICD-10-CM

## 2019-01-31 LAB — CUP PACEART REMOTE DEVICE CHECK
Battery Remaining Longevity: 120 mo
Battery Remaining Percentage: 95.5 %
Battery Voltage: 3.01 V
Brady Statistic AP VP Percent: 1 %
Brady Statistic AP VS Percent: 1.1 %
Brady Statistic AS VP Percent: 1 %
Brady Statistic AS VS Percent: 99 %
Brady Statistic RA Percent Paced: 1 %
Brady Statistic RV Percent Paced: 15 %
Date Time Interrogation Session: 20200629060032
Implantable Lead Implant Date: 20190329
Implantable Lead Implant Date: 20190329
Implantable Lead Location: 753859
Implantable Lead Location: 753860
Implantable Pulse Generator Implant Date: 20190329
Lead Channel Impedance Value: 410 Ohm
Lead Channel Impedance Value: 460 Ohm
Lead Channel Pacing Threshold Amplitude: 1 V
Lead Channel Pacing Threshold Amplitude: 1 V
Lead Channel Pacing Threshold Pulse Width: 0.5 ms
Lead Channel Pacing Threshold Pulse Width: 0.5 ms
Lead Channel Sensing Intrinsic Amplitude: 1.6 mV
Lead Channel Sensing Intrinsic Amplitude: 12 mV
Lead Channel Setting Pacing Amplitude: 2 V
Lead Channel Setting Pacing Amplitude: 2.5 V
Lead Channel Setting Pacing Pulse Width: 0.5 ms
Lead Channel Setting Sensing Sensitivity: 2 mV
Pulse Gen Model: 2272
Pulse Gen Serial Number: 9003317

## 2019-02-08 ENCOUNTER — Ambulatory Visit: Payer: Medicare Other

## 2019-02-08 ENCOUNTER — Other Ambulatory Visit: Payer: Medicare Other

## 2019-02-08 ENCOUNTER — Ambulatory Visit: Payer: Medicare Other | Admitting: Internal Medicine

## 2019-02-09 NOTE — Progress Notes (Signed)
Remote pacemaker transmission.   

## 2019-02-11 ENCOUNTER — Ambulatory Visit (HOSPITAL_COMMUNITY): Payer: Medicare Other

## 2019-02-14 ENCOUNTER — Ambulatory Visit (HOSPITAL_COMMUNITY)
Admission: RE | Admit: 2019-02-14 | Discharge: 2019-02-14 | Disposition: A | Discharge status: Home / Self Care | Payer: Medicare Other | Source: Ambulatory Visit | Attending: Internal Medicine | Admitting: Internal Medicine

## 2019-02-14 ENCOUNTER — Other Ambulatory Visit: Payer: Self-pay | Admitting: *Deleted

## 2019-02-14 ENCOUNTER — Other Ambulatory Visit: Payer: Self-pay

## 2019-02-14 ENCOUNTER — Other Ambulatory Visit: Payer: Medicare Other

## 2019-02-14 DIAGNOSIS — C3491 Malignant neoplasm of unspecified part of right bronchus or lung: Secondary | ICD-10-CM

## 2019-02-14 DIAGNOSIS — C3490 Malignant neoplasm of unspecified part of unspecified bronchus or lung: Secondary | ICD-10-CM | POA: Insufficient documentation

## 2019-02-14 DIAGNOSIS — E032 Hypothyroidism due to medicaments and other exogenous substances: Secondary | ICD-10-CM

## 2019-02-14 MED ORDER — HEPARIN SOD (PORK) LOCK FLUSH 100 UNIT/ML IV SOLN
500.0000 [IU] | Freq: Once | INTRAVENOUS | Status: AC
  Administered 2019-02-14: 500 [IU] via INTRAVENOUS

## 2019-02-14 MED ORDER — SODIUM CHLORIDE (PF) 0.9 % IJ SOLN
INTRAMUSCULAR | Status: AC
  Filled 2019-02-14: qty 50

## 2019-02-14 MED ORDER — HEPARIN SOD (PORK) LOCK FLUSH 100 UNIT/ML IV SOLN
INTRAVENOUS | Status: AC
  Filled 2019-02-14: qty 5

## 2019-02-14 MED ORDER — IOHEXOL 300 MG/ML  SOLN
100.0000 mL | Freq: Once | INTRAMUSCULAR | Status: AC | PRN
  Administered 2019-02-14: 13:00:00 100 mL via INTRAVENOUS

## 2019-02-15 ENCOUNTER — Other Ambulatory Visit: Payer: Medicare Other

## 2019-02-15 ENCOUNTER — Inpatient Hospital Stay: Payer: Medicare Other

## 2019-02-15 ENCOUNTER — Inpatient Hospital Stay: Payer: Medicare Other | Attending: Internal Medicine | Admitting: Internal Medicine

## 2019-02-15 ENCOUNTER — Encounter: Payer: Self-pay | Admitting: Internal Medicine

## 2019-02-15 ENCOUNTER — Other Ambulatory Visit: Payer: Self-pay

## 2019-02-15 ENCOUNTER — Telehealth: Payer: Self-pay | Admitting: Internal Medicine

## 2019-02-15 VITALS — BP 147/77 | HR 77 | Temp 98.5°F | Resp 20 | Ht 62.0 in | Wt 152.5 lb

## 2019-02-15 DIAGNOSIS — Z79899 Other long term (current) drug therapy: Secondary | ICD-10-CM | POA: Diagnosis not present

## 2019-02-15 DIAGNOSIS — C3490 Malignant neoplasm of unspecified part of unspecified bronchus or lung: Secondary | ICD-10-CM

## 2019-02-15 DIAGNOSIS — Z5112 Encounter for antineoplastic immunotherapy: Secondary | ICD-10-CM | POA: Insufficient documentation

## 2019-02-15 DIAGNOSIS — Z923 Personal history of irradiation: Secondary | ICD-10-CM

## 2019-02-15 DIAGNOSIS — C3491 Malignant neoplasm of unspecified part of right bronchus or lung: Secondary | ICD-10-CM

## 2019-02-15 DIAGNOSIS — E032 Hypothyroidism due to medicaments and other exogenous substances: Secondary | ICD-10-CM

## 2019-02-15 DIAGNOSIS — C3411 Malignant neoplasm of upper lobe, right bronchus or lung: Secondary | ICD-10-CM | POA: Diagnosis present

## 2019-02-15 DIAGNOSIS — Z95828 Presence of other vascular implants and grafts: Secondary | ICD-10-CM

## 2019-02-15 DIAGNOSIS — C773 Secondary and unspecified malignant neoplasm of axilla and upper limb lymph nodes: Secondary | ICD-10-CM | POA: Insufficient documentation

## 2019-02-15 LAB — CMP (CANCER CENTER ONLY)
ALT: 48 U/L — ABNORMAL HIGH (ref 0–44)
AST: 53 U/L — ABNORMAL HIGH (ref 15–41)
Albumin: 3.9 g/dL (ref 3.5–5.0)
Alkaline Phosphatase: 114 U/L (ref 38–126)
Anion gap: 8 (ref 5–15)
BUN: 7 mg/dL — ABNORMAL LOW (ref 8–23)
CO2: 26 mmol/L (ref 22–32)
Calcium: 9.1 mg/dL (ref 8.9–10.3)
Chloride: 107 mmol/L (ref 98–111)
Creatinine: 0.73 mg/dL (ref 0.44–1.00)
GFR, Est AFR Am: >60 mL/min (ref >60)
GFR, Estimated: >60 mL/min (ref >60)
Glucose, Bld: 96 mg/dL (ref 70–99)
Potassium: 4.4 mmol/L (ref 3.5–5.1)
Sodium: 141 mmol/L (ref 135–145)
Total Bilirubin: 1.1 mg/dL (ref 0.3–1.2)
Total Protein: 7 g/dL (ref 6.5–8.1)

## 2019-02-15 LAB — CBC WITH DIFFERENTIAL (CANCER CENTER ONLY)
Abs Immature Granulocytes: 0.02 10*3/uL (ref 0.00–0.07)
Basophils Absolute: 0 10*3/uL (ref 0.0–0.1)
Basophils Relative: 1 %
Eosinophils Absolute: 0.2 10*3/uL (ref 0.0–0.5)
Eosinophils Relative: 4 %
HCT: 38.8 % (ref 36.0–46.0)
Hemoglobin: 13.3 g/dL (ref 12.0–15.0)
Immature Granulocytes: 0 %
Lymphocytes Relative: 21 %
Lymphs Abs: 1.1 10*3/uL (ref 0.7–4.0)
MCH: 32.9 pg (ref 26.0–34.0)
MCHC: 34.3 g/dL (ref 30.0–36.0)
MCV: 96 fL (ref 80.0–100.0)
Monocytes Absolute: 0.4 10*3/uL (ref 0.1–1.0)
Monocytes Relative: 7 %
Neutro Abs: 3.5 10*3/uL (ref 1.7–7.7)
Neutrophils Relative %: 67 %
Platelet Count: 155 10*3/uL (ref 150–400)
RBC: 4.04 MIL/uL (ref 3.87–5.11)
RDW: 12.9 % (ref 11.5–15.5)
WBC Count: 5.2 10*3/uL (ref 4.0–10.5)
nRBC: 0 % (ref 0.0–0.2)

## 2019-02-15 LAB — TSH: TSH: 2.99 u[IU]/mL (ref 0.308–3.960)

## 2019-02-15 MED ORDER — SODIUM CHLORIDE 0.9 % IV SOLN
Freq: Once | INTRAVENOUS | Status: AC
  Administered 2019-02-15: 11:00:00 via INTRAVENOUS
  Filled 2019-02-15: qty 250

## 2019-02-15 MED ORDER — SODIUM CHLORIDE 0.9% FLUSH
10.0000 mL | INTRAVENOUS | Status: DC | PRN
  Administered 2019-02-15: 10 mL
  Filled 2019-02-15: qty 10

## 2019-02-15 MED ORDER — SODIUM CHLORIDE 0.9 % IV SOLN
480.0000 mg | Freq: Once | INTRAVENOUS | Status: AC
  Administered 2019-02-15: 12:00:00 480 mg via INTRAVENOUS
  Filled 2019-02-15: qty 48

## 2019-02-15 MED ORDER — SODIUM CHLORIDE 0.9% FLUSH
10.0000 mL | INTRAVENOUS | Status: DC | PRN
  Administered 2019-02-15: 10 mL via INTRAVENOUS
  Filled 2019-02-15: qty 10

## 2019-02-15 MED ORDER — HEPARIN SOD (PORK) LOCK FLUSH 100 UNIT/ML IV SOLN
500.0000 [IU] | Freq: Once | INTRAVENOUS | Status: AC | PRN
  Administered 2019-02-15: 500 [IU]
  Filled 2019-02-15: qty 5

## 2019-02-15 NOTE — Telephone Encounter (Signed)
Gave avs and calendar ° °

## 2019-02-15 NOTE — Progress Notes (Signed)
[place] [phone]) [phone]   [phone]  OFFICE PROGRESS NOTE  Harris, [name], [name] [address] [place] [address]  DIAGNOSIS: Metastatic non-small cell lung cancer, adenocarcinoma. This was initially diagnosed as stage IB (T2a, N0, M0) in March of 2014, status post right upper lobectomy with lymph node dissection but the patient has evidence for disease recurrence in the right axilla status post resection.  PRIOR THERAPY:  1) Status post right axillary lymph node biopsy on 05/01/2014 under the care of Dr. Roxan Hockey. 2) Systemic chemotherapy with carboplatin for AUC of 5 and Alimta 500 mg/M2 every 3 weeks. First cycle 05/31/2014. Status post 6 cycles. Last dose was given 09/22/2014. 3) palliative radiotherapy to the subcarinal lymph node under the care of Dr. Sondra Come. 4) Nivolumab 240 mg IV every 2 weeks. First dose 12/23/2016. Status post 6 cycles.  CURRENT THERAPY:  Nivolumab 480 mg IV every 4 weeks. First dose 03/17/2017. Status post 25 cycles.  INTERVAL HISTORY: Nancy Hodges 73 y.o. female returns to the clinic today for follow-up visit.  The patient has no complaints today.  She denied having any chest pain, shortness of breath, cough or hemoptysis.  She denied having any fever or chills.  She has no nausea, vomiting, diarrhea or constipation.  She denied having any skin rash or itching.  She has no recent weight loss or night sweats.  She continues to tolerate her treatment with nivolumab fairly well.  The patient had repeat CT scan of the chest, abdomen pelvis performed recently and she is here for evaluation and discussion of her scan results.  MEDICAL HISTORY: Past Medical History:  Diagnosis Date  . Allergic rhinitis   . Anxiety   . Arthritis   . Asthma   . Atrial flutter ([facility])   . Cholelithiases 09/26/2015  . Diverticulosis   . Encounter for antineoplastic immunotherapy 12/09/2016  . GERD (gastroesophageal reflux disease)    . H/O hiatal hernia   . History of blood transfusion   . History of chemotherapy   . History of kidney stones   . History of radiation therapy   . Internal hemorrhoid   . Lung cancer ([facility])    a. Stage IB non-small cell carcinoma, s/p R VATS, wedge resection, RU lobectomy 10/2012.  . Paroxysmal atrial fibrillation ([facility])    a. anticoagulated with Xarelto  . Pneumonia 06/2016   [facility]  . Presence of permanent cardiac pacemaker   . Pulmonary nodule   . 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    ALLERGIES:  is allergic to chlorpheniramine-dm; prednisone; septra [sulfamethoxazole-trimethoprim]; vioxx [rofecoxib]; and penicillins.  MEDICATIONS:  Current Outpatient Medications  Medication Sig Dispense Refill  . acetaminophen (TYLENOL) 500 MG tablet Take 500 mg by mouth 3 (three) times daily as needed for moderate pain or fever.     [name] albuterol (PROVENTIL HFA;VENTOLIN HFA) 108 (90 BASE) MCG/ACT inhaler Inhale 2 puffs into the lungs every 6 (six) hours as needed for wheezing or shortness of breath.     [name] azelastine (ASTELIN) 0.1 % nasal spray Place 1 spray into both nostrils 2 (two) times daily. Use in each nostril as directed    . calcium carbonate (TUMS - DOSED IN MG ELEMENTAL CALCIUM) 500 MG chewable tablet Chew 1 tablet by mouth 2 (two) times daily as needed for indigestion or heartburn.     [name] CARTIA XT 240 MG 24 hr capsule TAKE 1  CAPSULE(240 MG) BY MOUTH DAILY (Patient taking differently: Take 240 mg by mouth daily. ) 90 capsule 3  . dimenhyDRINATE (DRAMAMINE) 50 MG tablet Take 12.5 mg by mouth every 8 (eight) hours as needed for dizziness.    [name] ipratropium-albuterol (DUONEB) 0.5-2.5 (3) MG/3ML SOLN Take 3 mLs by nebulization every 6 (six) hours as needed (shortness of breath or wheezing).     [name] lidocaine-prilocaine (EMLA) cream Apply 1 application topically as needed. (Patient taking differently: Apply 1 application topically as  needed (for port). ) 30 g 4  . loratadine (CLARITIN) 10 MG tablet Take 10 mg by mouth daily.    . ondansetron (ZOFRAN ODT) 4 MG disintegrating tablet Take 1 tablet (4 mg total) by mouth every 8 (eight) hours as needed for nausea or vomiting. 20 tablet 0  . OVER THE COUNTER MEDICATION Take 5 mLs by mouth daily as needed (cough). [employer] kids cough syrup    . Polyvinyl Alcohol-Povidone (REFRESH OP) Place 1 drop into both eyes daily as needed (dry eyes).    [name] tablet TAKE 1 TABLET(20 MG) BY MOUTH DAILY WITH SUPPER 30 tablet 5  . metoprolol tartrate (LOPRESSOR) 100 MG tablet Take 1 tablet (100 mg total) by mouth once for 1 dose. Take one tablet 2 hours prior to cardiac CT 1 tablet 0   No current facility-administered medications for this visit.     SURGICAL HISTORY:  Past Surgical History:  Procedure Laterality Date  . CARDIOVERSION N/A 02/19/2018   Procedure: CARDIOVERSION;  Surgeon: Sanda Klein, MD;  Location: [facility];  Service: Cardiovascular;  Laterality: N/A;  . CARDIOVERSION N/A 04/30/2018   Procedure: CARDIOVERSION;  Surgeon: Thayer Headings, MD;  Location: [facility] ENDOSCOPY;  Service: Cardiovascular;  Laterality: N/A;  . CATARACT EXTRACTION W/PHACO Right 08/14/2016   Procedure: CATARACT EXTRACTION PHACO AND INTRAOCULAR LENS PLACEMENT RIGHT EYE CDE 10.53;  Surgeon: Tonny Branch, MD;  Location: [facility];  Service: Ophthalmology;  Laterality: Right;  right  . CATARACT EXTRACTION W/PHACO Left 09/25/2016   Procedure: CATARACT EXTRACTION PHACO AND INTRAOCULAR LENS PLACEMENT (IOC);  Surgeon: Tonny Branch, MD;  Location: [facility];  Service: Ophthalmology;  Laterality: Left;  left CDE 8.81  . COLONOSCOPY     [facility]: cattered sigmoid diverticula, internal hemorrhoids, sessile polyp in ascending and mid transverse colon (tubular adenoma).   . colonoscopy with polypectomy  2015  . ESOPHAGOGASTRODUODENOSCOPY     [facility]: bile reflux in stomach, mild gastritis, hiatal hernia  .  ESOPHAGOGASTRODUODENOSCOPY N/A 01/31/2016   Procedure: ESOPHAGOGASTRODUODENOSCOPY (EGD);  Surgeon: [name], MD;  [facility];  Service: Endoscopy;  Laterality: N/A;  1200  . IR FLUORO GUIDE PORT INSERTION RIGHT  06/30/2017  . IR US GUIDE VASC ACCESS RIGHT  06/30/2017  . LOBECTOMY Right 10/27/2012   Procedure: LOBECTOMY;  Surgeon: [name], MD;  Location: [facility];  Service: Thoracic;  Laterality: Right;   RIGHT UPPER LOBECTOMY & Node Dissection  . LYMPH NODE BIOPSY Right 05/01/2014   Procedure: LYMPH NODE BIOPSY, Right Axillary;  Surgeon: [name], MD;  Location: [facility];  Service: Thoracic;  Laterality: Right;  . PACEMAKER IMPLANT N/A 10/30/2017   SJM Assurity MRI PPM implanted by Dr [name] once leadless pacemaker battery depleted  . PARTIAL HYSTERECTOMY    . PERMANENT PACEMAKER INSERTION N/A 09/14/2012   Nanostim (SJM) leadless pacemaker (LEADLESS II STUDY PATIENT) implanted by Dr [name], no longer functioning, device abandoned.  [name] VIDEO ASSISTED THORACOSCOPY (VATS)/WEDGE RESECTION Right 10/27/2012  Procedure: VIDEO ASSISTED THORACOSCOPY (VATS),RGHT UPPER LOBE LUNG WEDGE RESECTION;  Surgeon: [name], MD;  Location: [facility];  Service: Thoracic;  Laterality: Right;    REVIEW OF SYSTEMS:  Constitutional: negative Eyes: negative Ears, nose, mouth, throat, and face: negative Respiratory: negative Cardiovascular: negative Gastrointestinal: negative Genitourinary:negative Integument/breast: negative Hematologic/lymphatic: negative Musculoskeletal:negative Neurological: negative Behavioral/Psych: negative Endocrine: negative Allergic/Immunologic: negative   PHYSICAL EXAMINATION: General appearance: alert, cooperative and no distress Head: Normocephalic, without obvious abnormality, atraumatic Neck: no adenopathy, no JVD, supple, symmetrical, trachea midline and thyroid not enlarged, symmetric, no tenderness/mass/nodules Lymph nodes: Cervical,  supraclavicular, and axillary nodes normal. Resp: clear to auscultation bilaterally Back: symmetric, no curvature. ROM normal. No CVA tenderness. Cardio: regular rate and rhythm, S1, S2 normal, no murmur, click, rub or gallop GI: soft, non-tender; bowel sounds normal; no masses,  no organomegaly Extremities: extremities normal, atraumatic, no cyanosis or edema Neurologic: Alert and oriented X 3, normal strength and tone. Normal symmetric reflexes. Normal coordination and gait  ECOG PERFORMANCE STATUS: 1 - Symptomatic but completely ambulatory  Blood pressure (!) 147/77, pulse 77, temperature 98.5 F (36.9 C), temperature source Oral, resp. rate 20, height 5\' 2"  (1.575 m), weight 152 lb 8 oz (69.2 kg), SpO2 98 %.  LABORATORY DATA: Lab Results  Component Value Date   WBC 5.2 02/15/2019   HGB 13.3 02/15/2019   HCT 38.8 02/15/2019   MCV 96.0 02/15/2019   PLT 155 02/15/2019      Chemistry      Component Value Date/Time   NA 141 01/18/2019 0952   NA 139 08/05/2017 0902   K 4.3 01/18/2019 0952   K 4.1 08/05/2017 0902   CL 105 01/18/2019 0952   CO2 26 01/18/2019 0952   CO2 28 08/05/2017 0902   BUN 13 01/18/2019 0952   BUN 10.1 08/05/2017 0902   CREATININE 0.78 01/18/2019 0952   CREATININE 0.8 08/05/2017 0902   GLU 134 (H) 02/18/2017 1410      Component Value Date/Time   CALCIUM 9.6 01/18/2019 0952   CALCIUM 9.5 08/05/2017 0902   ALKPHOS 109 01/18/2019 0952   ALKPHOS 111 08/05/2017 0902   AST 38 01/18/2019 0952   AST 33 08/05/2017 0902   ALT 36 01/18/2019 0952   ALT 34 08/05/2017 0902   BILITOT 1.0 01/18/2019 0952   BILITOT 1.39 (H) 08/05/2017 0902       RADIOGRAPHIC STUDIES: Ct Chest W Contrast  Result Date: 02/14/2019 CLINICAL DATA:  Recurrent right upper lobe lung adenocarcinoma originally diagnosed 2014 with right axillary and subcarinal recurrence. Ongoing immunotherapy. Restaging. EXAM: CT CHEST, ABDOMEN, AND PELVIS WITH CONTRAST TECHNIQUE: Multidetector CT  imaging of the chest, abdomen and pelvis was performed following the standard protocol during bolus administration of intravenous contrast. CONTRAST:  157mL OMNIPAQUE IOHEXOL 300 MG/ML  SOLN COMPARISON:  11/22/2018 CT chest, abdomen and pelvis. FINDINGS: CT CHEST FINDINGS Cardiovascular: Top-normal heart size. Two lead left subclavian ICD is noted with lead tips in right atrium and right ventricular apex. Right internal jugular Port-A-Cath terminates at the cavoatrial junction. Left anterior descending and left circumflex coronary atherosclerosis. No significant pericardial effusion/thickening. Atherosclerotic nonaneurysmal thoracic aorta. Normal caliber pulmonary arteries. No central pulmonary emboli. Mediastinum/Nodes: No discrete thyroid nodules. Unremarkable esophagus. Surgical clips are again noted in the right axilla. No pathologically enlarged axillary, mediastinal or hilar lymph nodes. Lungs/Pleura: No pneumothorax. No pleural effusion. Status post right upper lobectomy. Mild centrilobular emphysema. Stable sharply marginated mild paramediastinal consolidation in the right lower lobe, compatible with mild radiation  fibrosis. No acute consolidative airspace disease, lung masses or significant pulmonary nodules. Stable parenchymal banding in the peripheral right lung base compatible with nonspecific scarring. Musculoskeletal: No aggressive appearing focal osseous lesions. Mild thoracic spondylosis. CT ABDOMEN PELVIS FINDINGS Hepatobiliary: Diffusely mildly irregular liver surface with relative hypertrophy of the lateral segment left liver lobe, compatible with hepatic cirrhosis. No liver masses. Cholelithiasis. No biliary ductal dilatation. Pancreas: Normal, with no mass or duct dilation. Spleen: Normal size spleen. Low-attenuation 1.1 cm inferior splenic lesion is stable since at least 2018 CT, considered benign. No new splenic lesions. Adrenals/Urinary Tract: Normal adrenals. No hydronephrosis. Stable  scattered mild renal cortical scarring in both kidneys. No renal masses. Normal bladder. Stomach/Bowel: Small hiatal hernia. Otherwise normal nondistended stomach. Normal caliber small bowel with no small bowel wall thickening. Normal appendix. Moderate left colonic diverticulosis, with no large bowel wall thickening or significant pericolonic fat stranding. Oral contrast transits to the left colon. Vascular/Lymphatic: Atherosclerotic nonaneurysmal abdominal aorta. Patent portal, splenic, hepatic and renal veins. Stable small varices anterior to the left liver lobe. No pathologically enlarged lymph nodes in the abdomen or pelvis. Reproductive: Status post hysterectomy, with no abnormal findings at the vaginal cuff. No adnexal mass. Other: No pneumoperitoneum, ascites or focal fluid collection. Musculoskeletal: No aggressive appearing focal osseous lesions. Mild lumbar spondylosis. IMPRESSION: 1. No evidence of local tumor recurrence status post right upper lobectomy. 2. No findings of recurrent metastatic disease in the chest, abdomen or pelvis. 3. Chronic findings include: Aortic Atherosclerosis (ICD10-I70.0) and Emphysema (ICD10-J43.9). Hepatic cirrhosis. Cholelithiasis. Small hiatal hernia. Moderate left colonic diverticulosis. Electronically Signed   By: Ilona Sorrel M.D.   On: 02/14/2019 14:48   Ct Abdomen Pelvis W Contrast  Result Date: 02/14/2019 CLINICAL DATA:  Recurrent right upper lobe lung adenocarcinoma originally diagnosed 2014 with right axillary and subcarinal recurrence. Ongoing immunotherapy. Restaging. EXAM: CT CHEST, ABDOMEN, AND PELVIS WITH CONTRAST TECHNIQUE: Multidetector CT imaging of the chest, abdomen and pelvis was performed following the standard protocol during bolus administration of intravenous contrast. CONTRAST:  180mL OMNIPAQUE IOHEXOL 300 MG/ML  SOLN COMPARISON:  11/22/2018 CT chest, abdomen and pelvis. FINDINGS: CT CHEST FINDINGS Cardiovascular: Top-normal heart size. Two lead  left subclavian ICD is noted with lead tips in right atrium and right ventricular apex. Right internal jugular Port-A-Cath terminates at the cavoatrial junction. Left anterior descending and left circumflex coronary atherosclerosis. No significant pericardial effusion/thickening. Atherosclerotic nonaneurysmal thoracic aorta. Normal caliber pulmonary arteries. No central pulmonary emboli. Mediastinum/Nodes: No discrete thyroid nodules. Unremarkable esophagus. Surgical clips are again noted in the right axilla. No pathologically enlarged axillary, mediastinal or hilar lymph nodes. Lungs/Pleura: No pneumothorax. No pleural effusion. Status post right upper lobectomy. Mild centrilobular emphysema. Stable sharply marginated mild paramediastinal consolidation in the right lower lobe, compatible with mild radiation fibrosis. No acute consolidative airspace disease, lung masses or significant pulmonary nodules. Stable parenchymal banding in the peripheral right lung base compatible with nonspecific scarring. Musculoskeletal: No aggressive appearing focal osseous lesions. Mild thoracic spondylosis. CT ABDOMEN PELVIS FINDINGS Hepatobiliary: Diffusely mildly irregular liver surface with relative hypertrophy of the lateral segment left liver lobe, compatible with hepatic cirrhosis. No liver masses. Cholelithiasis. No biliary ductal dilatation. Pancreas: Normal, with no mass or duct dilation. Spleen: Normal size spleen. Low-attenuation 1.1 cm inferior splenic lesion is stable since at least 2018 CT, considered benign. No new splenic lesions. Adrenals/Urinary Tract: Normal adrenals. No hydronephrosis. Stable scattered mild renal cortical scarring in both kidneys. No renal masses. Normal bladder. Stomach/Bowel: Small hiatal hernia. Otherwise  normal nondistended stomach. Normal caliber small bowel with no small bowel wall thickening. Normal appendix. Moderate left colonic diverticulosis, with no large bowel wall thickening or  significant pericolonic fat stranding. Oral contrast transits to the left colon. Vascular/Lymphatic: Atherosclerotic nonaneurysmal abdominal aorta. Patent portal, splenic, hepatic and renal veins. Stable small varices anterior to the left liver lobe. No pathologically enlarged lymph nodes in the abdomen or pelvis. Reproductive: Status post hysterectomy, with no abnormal findings at the vaginal cuff. No adnexal mass. Other: No pneumoperitoneum, ascites or focal fluid collection. Musculoskeletal: No aggressive appearing focal osseous lesions. Mild lumbar spondylosis. IMPRESSION: 1. No evidence of local tumor recurrence status post right upper lobectomy. 2. No findings of recurrent metastatic disease in the chest, abdomen or pelvis. 3. Chronic findings include: Aortic Atherosclerosis (ICD10-I70.0) and Emphysema (ICD10-J43.9). Hepatic cirrhosis. Cholelithiasis. Small hiatal hernia. Moderate left colonic diverticulosis. Electronically Signed   By: Ilona Sorrel M.D.   On: 02/14/2019 14:48   ASSESSMENT AND PLAN:  This is a very pleasant 73 years old white female with metastatic non-small cell lung cancer, adenocarcinoma with no actionable mutations status post systemic chemotherapy with carboplatin and Alimta and she is currently undergoing treatment with second line immunotherapy with Nivolumab status post 6 cycles. This was followed by immunotherapy with Nivolumab 480 mg IV every 4 weeks status post 25 cycles. The patient continues to tolerate her treatment well with no concerning adverse effects. She had repeat CT scan of the chest, abdomen pelvis performed recently.  I personally and independently reviewed the scans and discussed the results with the patient today. Her scan showed no concerning findings for disease progression. I recommended for the patient to continue her current treatment with nivolumab and she will proceed with cycle #26 today. I will see the patient back for follow-up visit in 4 weeks  before the next cycle of her treatment. The patient was advised to call immediately if she has any concerning symptoms in the interval. The patient voices understanding of current disease status and treatment options and is in agreement with the current care plan. All questions were answered. The patient knows to call the clinic with any problems, questions or concerns. We can certainly see the patient much sooner if necessary.  Disclaimer: This note was dictated with voice recognition software. Similar sounding words can inadvertently be transcribed and may not be corrected upon review.

## 2019-02-15 NOTE — Patient Instructions (Signed)
[facility] Discharge Instructions for Patients Receiving Chemotherapy  Today you received the following chemotherapy agents :  Opdivo.  To help prevent nausea and vomiting after your treatment, we encourage you to take your nausea medication as prescribed.   If you develop nausea and vomiting that is not controlled by your nausea medication, call the clinic.   BELOW ARE SYMPTOMS THAT SHOULD BE REPORTED IMMEDIATELY:  *FEVER GREATER THAN 100.5 F  *CHILLS WITH OR WITHOUT FEVER  NAUSEA AND VOMITING THAT IS NOT CONTROLLED WITH YOUR NAUSEA MEDICATION  *UNUSUAL SHORTNESS OF BREATH  *UNUSUAL BRUISING OR BLEEDING  TENDERNESS IN MOUTH AND THROAT WITH OR WITHOUT PRESENCE OF ULCERS  *URINARY PROBLEMS  *BOWEL PROBLEMS  UNUSUAL RASH Items with * indicate a potential emergency and should be followed up as soon as possible.  Feel free to call the clinic should you have any questions or concerns. The clinic phone number is [phone].  Please show the CHEMO ALERT CARD at check-in to the Emergency Department and triage nurse.   

## 2019-03-14 ENCOUNTER — Other Ambulatory Visit: Payer: Self-pay | Admitting: *Deleted

## 2019-03-14 DIAGNOSIS — C3491 Malignant neoplasm of unspecified part of right bronchus or lung: Secondary | ICD-10-CM

## 2019-03-15 ENCOUNTER — Encounter: Payer: Self-pay | Admitting: Internal Medicine

## 2019-03-15 ENCOUNTER — Inpatient Hospital Stay: Payer: Medicare Other

## 2019-03-15 ENCOUNTER — Ambulatory Visit: Payer: Medicare Other

## 2019-03-15 ENCOUNTER — Ambulatory Visit: Payer: Medicare Other | Admitting: Internal Medicine

## 2019-03-15 ENCOUNTER — Other Ambulatory Visit: Payer: Medicare Other

## 2019-03-15 ENCOUNTER — Other Ambulatory Visit: Payer: Self-pay

## 2019-03-15 ENCOUNTER — Inpatient Hospital Stay: Payer: Medicare Other | Attending: Internal Medicine

## 2019-03-15 ENCOUNTER — Inpatient Hospital Stay (HOSPITAL_BASED_OUTPATIENT_CLINIC_OR_DEPARTMENT_OTHER): Payer: Medicare Other | Admitting: Internal Medicine

## 2019-03-15 VITALS — BP 147/64 | HR 70 | Temp 97.8°F | Resp 18 | Ht 62.0 in | Wt 148.6 lb

## 2019-03-15 DIAGNOSIS — C3491 Malignant neoplasm of unspecified part of right bronchus or lung: Secondary | ICD-10-CM | POA: Diagnosis not present

## 2019-03-15 DIAGNOSIS — Z5112 Encounter for antineoplastic immunotherapy: Secondary | ICD-10-CM

## 2019-03-15 DIAGNOSIS — C773 Secondary and unspecified malignant neoplasm of axilla and upper limb lymph nodes: Secondary | ICD-10-CM | POA: Diagnosis present

## 2019-03-15 DIAGNOSIS — C3490 Malignant neoplasm of unspecified part of unspecified bronchus or lung: Secondary | ICD-10-CM

## 2019-03-15 DIAGNOSIS — C3411 Malignant neoplasm of upper lobe, right bronchus or lung: Secondary | ICD-10-CM | POA: Insufficient documentation

## 2019-03-15 DIAGNOSIS — Z95828 Presence of other vascular implants and grafts: Secondary | ICD-10-CM

## 2019-03-15 LAB — CMP (CANCER CENTER ONLY)
ALT: 25 U/L (ref 0–44)
AST: 32 U/L (ref 15–41)
Albumin: 4 g/dL (ref 3.5–5.0)
Alkaline Phosphatase: 86 U/L (ref 38–126)
Anion gap: 8 (ref 5–15)
BUN: 14 mg/dL (ref 8–23)
CO2: 26 mmol/L (ref 22–32)
Calcium: 9.3 mg/dL (ref 8.9–10.3)
Chloride: 105 mmol/L (ref 98–111)
Creatinine: 0.69 mg/dL (ref 0.44–1.00)
GFR, Est AFR Am: >60 mL/min (ref >60)
GFR, Estimated: >60 mL/min (ref >60)
Glucose, Bld: 111 mg/dL — ABNORMAL HIGH (ref 70–99)
Potassium: 4.2 mmol/L (ref 3.5–5.1)
Sodium: 139 mmol/L (ref 135–145)
Total Bilirubin: 1.1 mg/dL (ref 0.3–1.2)
Total Protein: 7.3 g/dL (ref 6.5–8.1)

## 2019-03-15 LAB — CBC WITH DIFFERENTIAL (CANCER CENTER ONLY)
Abs Immature Granulocytes: 0.01 10*3/uL (ref 0.00–0.07)
Basophils Absolute: 0 10*3/uL (ref 0.0–0.1)
Basophils Relative: 1 %
Eosinophils Absolute: 0.1 10*3/uL (ref 0.0–0.5)
Eosinophils Relative: 2 %
HCT: 38.2 % (ref 36.0–46.0)
Hemoglobin: 13.3 g/dL (ref 12.0–15.0)
Immature Granulocytes: 0 %
Lymphocytes Relative: 21 %
Lymphs Abs: 1.3 10*3/uL (ref 0.7–4.0)
MCH: 33.1 pg (ref 26.0–34.0)
MCHC: 34.8 g/dL (ref 30.0–36.0)
MCV: 95 fL (ref 80.0–100.0)
Monocytes Absolute: 0.4 10*3/uL (ref 0.1–1.0)
Monocytes Relative: 7 %
Neutro Abs: 4.2 10*3/uL (ref 1.7–7.7)
Neutrophils Relative %: 69 %
Platelet Count: 196 10*3/uL (ref 150–400)
RBC: 4.02 MIL/uL (ref 3.87–5.11)
RDW: 13.5 % (ref 11.5–15.5)
WBC Count: 6.1 10*3/uL (ref 4.0–10.5)
nRBC: 0 % (ref 0.0–0.2)

## 2019-03-15 MED ORDER — SODIUM CHLORIDE 0.9% FLUSH
10.0000 mL | INTRAVENOUS | Status: DC | PRN
  Administered 2019-03-15: 14:00:00 10 mL
  Filled 2019-03-15: qty 10

## 2019-03-15 MED ORDER — SODIUM CHLORIDE 0.9 % IV SOLN
Freq: Once | INTRAVENOUS | Status: AC
  Administered 2019-03-15: 13:00:00 via INTRAVENOUS
  Filled 2019-03-15: qty 250

## 2019-03-15 MED ORDER — SODIUM CHLORIDE 0.9% FLUSH
10.0000 mL | INTRAVENOUS | Status: DC | PRN
  Administered 2019-03-15: 10 mL via INTRAVENOUS
  Filled 2019-03-15: qty 10

## 2019-03-15 MED ORDER — SODIUM CHLORIDE 0.9 % IV SOLN
480.0000 mg | Freq: Once | INTRAVENOUS | Status: AC
  Administered 2019-03-15: 480 mg via INTRAVENOUS
  Filled 2019-03-15: qty 48

## 2019-03-15 MED ORDER — HEPARIN SOD (PORK) LOCK FLUSH 100 UNIT/ML IV SOLN
500.0000 [IU] | Freq: Once | INTRAVENOUS | Status: AC | PRN
  Administered 2019-03-15: 500 [IU]
  Filled 2019-03-15: qty 5

## 2019-03-15 NOTE — Patient Instructions (Signed)
[name] Discharge Instructions for Patients Receiving Chemotherapy  Today you received the following chemotherapy agents Opdivo  To help prevent nausea and vomiting after your treatment, we encourage you to take your nausea medication as directed   If you develop nausea and vomiting that is not controlled by your nausea medication, call the clinic.   BELOW ARE SYMPTOMS THAT SHOULD BE REPORTED IMMEDIATELY:  *FEVER GREATER THAN 100.5 F  *CHILLS WITH OR WITHOUT FEVER  NAUSEA AND VOMITING THAT IS NOT CONTROLLED WITH YOUR NAUSEA MEDICATION  *UNUSUAL SHORTNESS OF BREATH  *UNUSUAL BRUISING OR BLEEDING  TENDERNESS IN MOUTH AND THROAT WITH OR WITHOUT PRESENCE OF ULCERS  *URINARY PROBLEMS  *BOWEL PROBLEMS  UNUSUAL RASH Items with * indicate a potential emergency and should be followed up as soon as possible.  Feel free to call the clinic should you have any questions or concerns. The clinic phone number is [phone].  Please show the CHEMO ALERT CARD at check-in to the Emergency Department and triage nurse.   

## 2019-03-15 NOTE — Progress Notes (Signed)
[facility] [phone]   [phone]  OFFICE PROGRESS NOTE  Harris, Nancy L, [name] [address] [place] [address]  DIAGNOSIS: Metastatic non-small cell lung cancer, adenocarcinoma. This was initially diagnosed as stage IB (T2a, N0, M0) in March of 2014, status post right upper lobectomy with lymph node dissection but the patient has evidence for disease recurrence in the right axilla status post resection.  PRIOR THERAPY:  1) Status post right axillary lymph node biopsy on 05/01/2014 under the care of Dr. Roxan Hockey. 2) Systemic chemotherapy with carboplatin for AUC of 5 and Alimta 500 mg/M2 every 3 weeks. First cycle 05/31/2014. Status post 6 cycles. Last dose was given 09/22/2014. 3) palliative radiotherapy to the subcarinal lymph node under the care of Dr. Sondra Come. 4) Nivolumab 240 mg IV every 2 weeks. First dose 12/23/2016. Status post 6 cycles.  CURRENT THERAPY:  Nivolumab 480 mg IV every 4 weeks. First dose 03/17/2017. Status post 26 cycles.  INTERVAL HISTORY: Nancy Hodges 73 y.o. female returns to the clinic today for follow-up visit.  The patient is feeling fine today with no concerning complaints.  She denied having any chest pain, shortness of breath, cough or hemoptysis.  She denied having any fever or chills.  She has no nausea, vomiting, diarrhea or constipation.  She denied having any headache or visual changes.  The patient is here today for evaluation before starting cycle #27.   MEDICAL HISTORY: Past Medical History:  Diagnosis Date  . Allergic rhinitis   . Anxiety   . Arthritis   . Asthma   . Atrial flutter ([place])   . Cholelithiases 09/26/2015  . Diverticulosis   . Encounter for antineoplastic immunotherapy 12/09/2016  . GERD (gastroesophageal reflux disease)   . H/O hiatal hernia   . History of blood transfusion   . History of chemotherapy   . History of kidney stones   . History of radiation therapy   .  Internal hemorrhoid   . Lung cancer (Nancy Hodges)    a. Stage IB non-small cell carcinoma, s/p R VATS, wedge resection, RU lobectomy 10/2012.  . Paroxysmal atrial fibrillation ([facility])    a. anticoagulated with Xarelto  . Pneumonia 06/2016   [facility]  . Presence of permanent cardiac pacemaker   . Pulmonary nodule   . Tachycardia-bradycardia syndrome (Nancy Hodges)    a. 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    ALLERGIES:  is allergic to chlorpheniramine-dm; prednisone; septra [sulfamethoxazole-trimethoprim]; vioxx [rofecoxib]; and penicillins.  MEDICATIONS:  Current Outpatient Medications  Medication Sig Dispense Refill  . acetaminophen (TYLENOL) 500 MG tablet Take 500 mg by mouth 3 (three) times daily as needed for moderate pain or fever.     [name] albuterol (PROVENTIL HFA;VENTOLIN HFA) 108 (90 BASE) MCG/ACT inhaler Inhale 2 puffs into the lungs every 6 (six) hours as needed for wheezing or shortness of breath.     [name] azelastine (ASTELIN) 0.1 % nasal spray Place 1 spray into both nostrils 2 (two) times daily. Use in each nostril as directed    . calcium carbonate (TUMS - DOSED IN MG ELEMENTAL CALCIUM) 500 MG chewable tablet Chew 1 tablet by mouth 2 (two) times daily as needed for indigestion or heartburn.     [name] CARTIA XT 240 MG 24 hr capsule TAKE 1 CAPSULE(240 MG) BY MOUTH DAILY (Patient taking differently: Take 240 mg by mouth daily. ) 90 capsule 3  . dimenhyDRINATE (DRAMAMINE) 50 MG tablet Take 12.5 mg by mouth  every 8 (eight) hours as needed for dizziness.    [name] ipratropium-albuterol (DUONEB) 0.5-2.5 (3) MG/3ML SOLN Take 3 mLs by nebulization every 6 (six) hours as needed (shortness of breath or wheezing).     [name] lidocaine-prilocaine (EMLA) cream Apply 1 application topically as needed. (Patient taking differently: Apply 1 application topically as needed (for port). ) 30 g 4  . loratadine (CLARITIN) 10 MG tablet Take 10 mg by mouth daily.    . metoprolol tartrate (LOPRESSOR) 100 MG tablet Take 1 tablet  (100 mg total) by mouth once for 1 dose. Take one tablet 2 hours prior to cardiac CT 1 tablet 0  . ondansetron (ZOFRAN ODT) 4 MG disintegrating tablet Take 1 tablet (4 mg total) by mouth every 8 (eight) hours as needed for nausea or vomiting. 20 tablet 0  . OVER THE COUNTER MEDICATION Take 5 mLs by mouth daily as needed (cough). [employer] kids cough syrup    . Polyvinyl Alcohol-Povidone (REFRESH OP) Place 1 drop into both eyes daily as needed (dry eyes).    [name] tablet TAKE 1 TABLET(20 MG) BY MOUTH DAILY WITH SUPPER 30 tablet 5   No current facility-administered medications for this visit.    Facility-Administered Medications Ordered in Other Visits  Medication Dose Route Frequency Provider Last Rate Last Dose  . sodium chloride flush (NS) 0.9 % injection 10 mL  10 mL Intravenous PRN Curt Bears, MD   10 mL at 03/15/19 1112    SURGICAL HISTORY:  Past Surgical History:  Procedure Laterality Date  . CARDIOVERSION N/A 02/19/2018   Procedure: CARDIOVERSION;  Surgeon: Nancy Klein, MD;  Location: [facility];  Service: Cardiovascular;  Laterality: N/A;  . CARDIOVERSION N/A 04/30/2018   Procedure: CARDIOVERSION;  Surgeon: Nancy Headings, MD;  Location: [facility] END[facility]COPY;  Service: Cardiovascular;  Laterality: N/A;  . CATARACT EXTRACTION W/PHACO Right 08/14/2016   Procedure: CATARACT EXTRACTION PHACO AND INTRAOCULAR LENS PLACEMENT RIGHT EYE CDE 10.53;  Surgeon: Nancy Branch, MD;  Location: [facility];  Service: Ophthalmology;  Laterality: Right;  right  . CATARACT EXTRACTION W/PHACO Left 09/25/2016   Procedure: CATARACT EXTRACTION PHACO AND INTRAOCULAR LENS PLACEMENT (IOC);  Surgeon: Nancy Branch, MD;  Location: [facility];  Service: Ophthalmology;  Laterality: Left;  left CDE 8.81  . COLONOSCOPY     [facility]: cattered sigmoid diverticula, internal hemorrhoids, sessile polyp in ascending and mid transverse colon (tubular adenoma).   . colonoscopy with polypectomy  2015  .  ESOPHAGOGASTRODUODENOSCOPY     [facility]: bile reflux in stomach, mild gastritis, hiatal hernia  . ESOPHAGOGASTRODUODENOSCOPY N/A 01/31/2016   Procedure: ESOPHAGOGASTRODUODENOSCOPY (EGD);  Surgeon: [name], MD;  Location: [facility];  Service: Endoscopy;  Laterality: N/A;  1200  . IR FLUORO GUIDE PORT INSERTION RIGHT  06/30/2017  . IR US GUIDE VASC ACCESS RIGHT  06/30/2017  . LOBECTOMY Right 10/27/2012   Procedure: LOBECTOMY;  Surgeon: [name], MD;  Location: [facility];  Service: Thoracic;  Laterality: Right;   RIGHT UPPER LOBECTOMY & Node Dissection  . LYMPH NODE BIOPSY Right 05/01/2014   Procedure: LYMPH NODE BIOPSY, Right Axillary;  Surgeon: [name], MD;  Location: [facility];  Service: Thoracic;  Laterality: Right;  . PACEMAKER IMPLANT N/A 10/30/2017   SJM Assurity MRI PPM implanted by Dr [name] once leadless pacemaker battery depleted  . PARTIAL HYSTERECTOMY    . PERMANENT PACEMAKER INSERTION N/A 09/14/2012   Nanostim (SJM) leadless pacemaker (LEADLESS II STUDY PATIENT) implanted by Dr [name],  no longer functioning, device abandoned.  [name] VIDEO ASSISTED THORACOSCOPY (VATS)/WEDGE RESECTION Right 10/27/2012   Procedure: VIDEO ASSISTED THORACOSCOPY (VATS),RGHT UPPER LOBE LUNG WEDGE RESECTION;  Surgeon: [name], MD;  Location: [facility];  Service: Thoracic;  Laterality: Right;    REVIEW OF SYSTEMS:  A comprehensive review of systems was negative.   PHYSICAL EXAMINATION: General appearance: alert, cooperative and no distress Head: Normocephalic, without obvious abnormality, atraumatic Neck: no adenopathy, no JVD, supple, symmetrical, trachea midline and thyroid not enlarged, symmetric, no tenderness/mass/nodules Lymph nodes: Cervical, supraclavicular, and axillary nodes normal. Resp: clear to auscultation bilaterally Back: symmetric, no curvature. ROM normal. No CVA tenderness. Cardio: regular rate and rhythm, S1, S2 normal, no murmur, click, rub or gallop GI:  soft, non-tender; bowel sounds normal; no masses,  no organomegaly Extremities: extremities normal, atraumatic, no cyanosis or edema  ECOG PERFORMANCE STATUS: 1 - Symptomatic but completely ambulatory  Blood pressure (!) 147/64, pulse 70, temperature 97.8 F (36.6 C), temperature source Temporal, resp. rate 18, height 5\' 2"  (1.575 m), weight 148 lb 9 oz (67.4 kg), SpO2 97 %.  LABORATORY DATA: Lab Results  Component Value Date   WBC 6.1 03/15/2019   HGB 13.3 03/15/2019   HCT 38.2 03/15/2019   MCV 95.0 03/15/2019   PLT 196 03/15/2019      Chemistry      Component Value Date/Time   NA 141 02/15/2019 0938   NA 139 08/05/2017 0902   K 4.4 02/15/2019 0938   K 4.1 08/05/2017 0902   CL 107 02/15/2019 0938   CO2 26 02/15/2019 0938   CO2 28 08/05/2017 0902   BUN 7 ([place]) 02/15/2019 0938   BUN 10.1 08/05/2017 0902   CREATININE 0.73 02/15/2019 0938   CREATININE 0.8 08/05/2017 0902   GLU 134 (H) 02/18/2017 1410      Component Value Date/Time   CALCIUM 9.1 02/15/2019 0938   CALCIUM 9.5 08/05/2017 0902   ALKPHOS 114 02/15/2019 0938   ALKPHOS 111 08/05/2017 0902   AST 53 (H) 02/15/2019 0938   AST 33 08/05/2017 0902   ALT 48 (H) 02/15/2019 0938   ALT 34 08/05/2017 0902   BILITOT 1.1 02/15/2019 0938   BILITOT 1.39 (H) 08/05/2017 0902       RADIOGRAPHIC STUDIES: Ct Chest W Contrast  Result Date: 02/14/2019 CLINICAL DATA:  Recurrent right upper lobe lung adenocarcinoma originally diagnosed 2014 with right axillary and subcarinal recurrence. Ongoing immunotherapy. Restaging. EXAM: CT CHEST, ABDOMEN, AND PELVIS WITH CONTRAST TECHNIQUE: Multidetector CT imaging of the chest, abdomen and pelvis was performed following the standard protocol during bolus administration of intravenous contrast. CONTRAST:  185mL OMNIPAQUE IOHEXOL 300 MG/ML  SOLN COMPARISON:  11/22/2018 CT chest, abdomen and pelvis. FINDINGS: CT CHEST FINDINGS Cardiovascular: Top-normal heart size. Two lead left subclavian ICD is  noted with lead tips in right atrium and right ventricular apex. Right internal jugular Port-A-Cath terminates at the cavoatrial junction. Left anterior descending and left circumflex coronary atherosclerosis. No significant pericardial effusion/thickening. Atherosclerotic nonaneurysmal thoracic aorta. Normal caliber pulmonary arteries. No central pulmonary emboli. Mediastinum/Nodes: No discrete thyroid nodules. Unremarkable esophagus. Surgical clips are again noted in the right axilla. No pathologically enlarged axillary, mediastinal or hilar lymph nodes. Lungs/Pleura: No pneumothorax. No pleural effusion. Status post right upper lobectomy. Mild centrilobular emphysema. Stable sharply marginated mild paramediastinal consolidation in the right lower lobe, compatible with mild radiation fibrosis. No acute consolidative airspace disease, lung masses or significant pulmonary nodules. Stable parenchymal banding in the peripheral right lung base compatible  with nonspecific scarring. Musculoskeletal: No aggressive appearing focal osseous lesions. Mild thoracic spondylosis. CT ABDOMEN PELVIS FINDINGS Hepatobiliary: Diffusely mildly irregular liver surface with relative hypertrophy of the lateral segment left liver lobe, compatible with hepatic cirrhosis. No liver masses. Cholelithiasis. No biliary ductal dilatation. Pancreas: Normal, with no mass or duct dilation. Spleen: Normal size spleen. Low-attenuation 1.1 cm inferior splenic lesion is stable since at least 2018 CT, considered benign. No new splenic lesions. Adrenals/Urinary Tract: Normal adrenals. No hydronephrosis. Stable scattered mild renal cortical scarring in both kidneys. No renal masses. Normal bladder. Stomach/Bowel: Small hiatal hernia. Otherwise normal nondistended stomach. Normal caliber small bowel with no small bowel wall thickening. Normal appendix. Moderate left colonic diverticulosis, with no large bowel wall thickening or significant pericolonic fat  stranding. Oral contrast transits to the left colon. Vascular/Lymphatic: Atherosclerotic nonaneurysmal abdominal aorta. Patent portal, splenic, hepatic and renal veins. Stable small varices anterior to the left liver lobe. No pathologically enlarged lymph nodes in the abdomen or pelvis. Reproductive: Status post hysterectomy, with no abnormal findings at the vaginal cuff. No adnexal mass. Other: No pneumoperitoneum, ascites or focal fluid collection. Musculoskeletal: No aggressive appearing focal osseous lesions. Mild lumbar spondylosis. IMPRESSION: 1. No evidence of local tumor recurrence status post right upper lobectomy. 2. No findings of recurrent metastatic disease in the chest, abdomen or pelvis. 3. Chronic findings include: Aortic Atherosclerosis (ICD10-I70.0) and Emphysema (ICD10-J43.9). Hepatic cirrhosis. Cholelithiasis. Small hiatal hernia. Moderate left colonic diverticulosis. Electronically Signed   By: Ilona Sorrel M.D.   On: 02/14/2019 14:48   Ct Abdomen Pelvis W Contrast  Result Date: 02/14/2019 CLINICAL DATA:  Recurrent right upper lobe lung adenocarcinoma originally diagnosed 2014 with right axillary and subcarinal recurrence. Ongoing immunotherapy. Restaging. EXAM: CT CHEST, ABDOMEN, AND PELVIS WITH CONTRAST TECHNIQUE: Multidetector CT imaging of the chest, abdomen and pelvis was performed following the standard protocol during bolus administration of intravenous contrast. CONTRAST:  116mL OMNIPAQUE IOHEXOL 300 MG/ML  SOLN COMPARISON:  11/22/2018 CT chest, abdomen and pelvis. FINDINGS: CT CHEST FINDINGS Cardiovascular: Top-normal heart size. Two lead left subclavian ICD is noted with lead tips in right atrium and right ventricular apex. Right internal jugular Port-A-Cath terminates at the cavoatrial junction. Left anterior descending and left circumflex coronary atherosclerosis. No significant pericardial effusion/thickening. Atherosclerotic nonaneurysmal thoracic aorta. Normal caliber  pulmonary arteries. No central pulmonary emboli. Mediastinum/Nodes: No discrete thyroid nodules. Unremarkable esophagus. Surgical clips are again noted in the right axilla. No pathologically enlarged axillary, mediastinal or hilar lymph nodes. Lungs/Pleura: No pneumothorax. No pleural effusion. Status post right upper lobectomy. Mild centrilobular emphysema. Stable sharply marginated mild paramediastinal consolidation in the right lower lobe, compatible with mild radiation fibrosis. No acute consolidative airspace disease, lung masses or significant pulmonary nodules. Stable parenchymal banding in the peripheral right lung base compatible with nonspecific scarring. Musculoskeletal: No aggressive appearing focal osseous lesions. Mild thoracic spondylosis. CT ABDOMEN PELVIS FINDINGS Hepatobiliary: Diffusely mildly irregular liver surface with relative hypertrophy of the lateral segment left liver lobe, compatible with hepatic cirrhosis. No liver masses. Cholelithiasis. No biliary ductal dilatation. Pancreas: Normal, with no mass or duct dilation. Spleen: Normal size spleen. Low-attenuation 1.1 cm inferior splenic lesion is stable since at least 2018 CT, considered benign. No new splenic lesions. Adrenals/Urinary Tract: Normal adrenals. No hydronephrosis. Stable scattered mild renal cortical scarring in both kidneys. No renal masses. Normal bladder. Stomach/Bowel: Small hiatal hernia. Otherwise normal nondistended stomach. Normal caliber small bowel with no small bowel wall thickening. Normal appendix. Moderate left colonic diverticulosis, with no large  bowel wall thickening or significant pericolonic fat stranding. Oral contrast transits to the left colon. Vascular/Lymphatic: Atherosclerotic nonaneurysmal abdominal aorta. Patent portal, splenic, hepatic and renal veins. Stable small varices anterior to the left liver lobe. No pathologically enlarged lymph nodes in the abdomen or pelvis. Reproductive: Status post  hysterectomy, with no abnormal findings at the vaginal cuff. No adnexal mass. Other: No pneumoperitoneum, ascites or focal fluid collection. Musculoskeletal: No aggressive appearing focal osseous lesions. Mild lumbar spondylosis. IMPRESSION: 1. No evidence of local tumor recurrence status post right upper lobectomy. 2. No findings of recurrent metastatic disease in the chest, abdomen or pelvis. 3. Chronic findings include: Aortic Atherosclerosis (ICD10-I70.0) and Emphysema (ICD10-J43.9). Hepatic cirrhosis. Cholelithiasis. Small hiatal hernia. Moderate left colonic diverticulosis. Electronically Signed   By: Ilona Sorrel M.D.   On: 02/14/2019 14:48   ASSESSMENT AND PLAN:  This is a very pleasant 73 years old white female with metastatic non-small cell lung cancer, adenocarcinoma with no actionable mutations status post systemic chemotherapy with carboplatin and Alimta and she is currently undergoing treatment with second line immunotherapy with Nivolumab status post 6 cycles. This was followed by immunotherapy with Nivolumab 480 mg IV every 4 weeks status post 26 cycles. She continues to tolerate her treatment well with no concerning adverse effects. I recommended for the patient to proceed with cycle #27 today as planned. She will come back for follow-up visit in 4 weeks for evaluation before the next cycle of her treatment. For hypertension she will continue with her current blood pressure medication and follow-up with her cardiologist. She was advised to call immediately if she has any concerning symptoms in the interval. The patient voices understanding of current disease status and treatment options and is in agreement with the current care plan. All questions were answered. The patient knows to call the clinic with any problems, questions or concerns. We can certainly see the patient much sooner if necessary.  Disclaimer: This note was dictated with voice recognition software. Similar sounding  words can inadvertently be transcribed and may not be corrected upon review.

## 2019-03-15 NOTE — Patient Instructions (Signed)

## 2019-03-23 ENCOUNTER — Other Ambulatory Visit: Payer: Self-pay | Admitting: Internal Medicine

## 2019-03-23 DIAGNOSIS — Z1231 Encounter for screening mammogram for malignant neoplasm of breast: Secondary | ICD-10-CM

## 2019-03-29 ENCOUNTER — Other Ambulatory Visit: Payer: Self-pay

## 2019-03-29 ENCOUNTER — Ambulatory Visit
Admission: RE | Admit: 2019-03-29 | Discharge: 2019-03-29 | Disposition: A | Discharge status: Home / Self Care | Payer: Medicare Other | Source: Ambulatory Visit | Attending: Internal Medicine | Admitting: Internal Medicine

## 2019-03-29 DIAGNOSIS — Z1231 Encounter for screening mammogram for malignant neoplasm of breast: Secondary | ICD-10-CM

## 2019-03-30 ENCOUNTER — Other Ambulatory Visit: Payer: Self-pay | Admitting: Internal Medicine

## 2019-04-08 ENCOUNTER — Other Ambulatory Visit: Payer: Self-pay | Admitting: Internal Medicine

## 2019-04-08 ENCOUNTER — Other Ambulatory Visit: Payer: Self-pay | Admitting: *Deleted

## 2019-04-08 DIAGNOSIS — E032 Hypothyroidism due to medicaments and other exogenous substances: Secondary | ICD-10-CM

## 2019-04-08 DIAGNOSIS — C3491 Malignant neoplasm of unspecified part of right bronchus or lung: Secondary | ICD-10-CM

## 2019-04-08 NOTE — Telephone Encounter (Signed)
Prescription refill request for Xarelto received.   Last office visit: [place],  05-07-2018 Weight: 148 lbs Age: 73 y.o. Scr: 0.77 CrCl: 67 ml/min  Prescription refill sent

## 2019-04-12 ENCOUNTER — Inpatient Hospital Stay: Payer: Medicare Other

## 2019-04-12 ENCOUNTER — Encounter: Payer: Self-pay | Admitting: Internal Medicine

## 2019-04-12 ENCOUNTER — Other Ambulatory Visit: Payer: Self-pay

## 2019-04-12 ENCOUNTER — Inpatient Hospital Stay: Payer: Medicare Other | Attending: Internal Medicine | Admitting: Internal Medicine

## 2019-04-12 VITALS — BP 133/41 | HR 67 | Temp 98.7°F | Resp 18 | Ht 62.0 in | Wt 152.2 lb

## 2019-04-12 DIAGNOSIS — Z5112 Encounter for antineoplastic immunotherapy: Secondary | ICD-10-CM | POA: Diagnosis present

## 2019-04-12 DIAGNOSIS — C3490 Malignant neoplasm of unspecified part of unspecified bronchus or lung: Secondary | ICD-10-CM | POA: Diagnosis not present

## 2019-04-12 DIAGNOSIS — Z79899 Other long term (current) drug therapy: Secondary | ICD-10-CM | POA: Insufficient documentation

## 2019-04-12 DIAGNOSIS — Z95828 Presence of other vascular implants and grafts: Secondary | ICD-10-CM

## 2019-04-12 DIAGNOSIS — C773 Secondary and unspecified malignant neoplasm of axilla and upper limb lymph nodes: Secondary | ICD-10-CM | POA: Insufficient documentation

## 2019-04-12 DIAGNOSIS — E032 Hypothyroidism due to medicaments and other exogenous substances: Secondary | ICD-10-CM

## 2019-04-12 DIAGNOSIS — C3411 Malignant neoplasm of upper lobe, right bronchus or lung: Secondary | ICD-10-CM | POA: Insufficient documentation

## 2019-04-12 DIAGNOSIS — C3491 Malignant neoplasm of unspecified part of right bronchus or lung: Secondary | ICD-10-CM

## 2019-04-12 LAB — CBC WITH DIFFERENTIAL (CANCER CENTER ONLY)
Abs Immature Granulocytes: 0.03 10*3/uL (ref 0.00–0.07)
Basophils Absolute: 0.1 10*3/uL (ref 0.0–0.1)
Basophils Relative: 1 %
Eosinophils Absolute: 0.2 10*3/uL (ref 0.0–0.5)
Eosinophils Relative: 3 %
HCT: 39.2 % (ref 36.0–46.0)
Hemoglobin: 13.3 g/dL (ref 12.0–15.0)
Immature Granulocytes: 0 %
Lymphocytes Relative: 21 %
Lymphs Abs: 1.5 10*3/uL (ref 0.7–4.0)
MCH: 33 pg (ref 26.0–34.0)
MCHC: 33.9 g/dL (ref 30.0–36.0)
MCV: 97.3 fL (ref 80.0–100.0)
Monocytes Absolute: 0.6 10*3/uL (ref 0.1–1.0)
Monocytes Relative: 9 %
Neutro Abs: 4.6 10*3/uL (ref 1.7–7.7)
Neutrophils Relative %: 66 %
Platelet Count: 197 10*3/uL (ref 150–400)
RBC: 4.03 MIL/uL (ref 3.87–5.11)
RDW: 13 % (ref 11.5–15.5)
WBC Count: 6.9 10*3/uL (ref 4.0–10.5)
nRBC: 0 % (ref 0.0–0.2)

## 2019-04-12 LAB — CMP (CANCER CENTER ONLY)
ALT: 41 U/L (ref 0–44)
AST: 47 U/L — ABNORMAL HIGH (ref 15–41)
Albumin: 3.9 g/dL (ref 3.5–5.0)
Alkaline Phosphatase: 110 U/L (ref 38–126)
Anion gap: 7 (ref 5–15)
BUN: 12 mg/dL (ref 8–23)
CO2: 27 mmol/L (ref 22–32)
Calcium: 9.5 mg/dL (ref 8.9–10.3)
Chloride: 105 mmol/L (ref 98–111)
Creatinine: 0.72 mg/dL (ref 0.44–1.00)
GFR, Est AFR Am: >60 mL/min (ref >60)
GFR, Estimated: >60 mL/min (ref >60)
Glucose, Bld: 137 mg/dL — ABNORMAL HIGH (ref 70–99)
Potassium: 4.7 mmol/L (ref 3.5–5.1)
Sodium: 139 mmol/L (ref 135–145)
Total Bilirubin: 0.9 mg/dL (ref 0.3–1.2)
Total Protein: 6.8 g/dL (ref 6.5–8.1)

## 2019-04-12 LAB — TSH: TSH: 5.62 u[IU]/mL — ABNORMAL HIGH (ref 0.308–3.960)

## 2019-04-12 MED ORDER — SODIUM CHLORIDE 0.9 % IV SOLN
480.0000 mg | Freq: Once | INTRAVENOUS | Status: AC
  Administered 2019-04-12: 480 mg via INTRAVENOUS
  Filled 2019-04-12: qty 48

## 2019-04-12 MED ORDER — HEPARIN SOD (PORK) LOCK FLUSH 100 UNIT/ML IV SOLN
500.0000 [IU] | Freq: Once | INTRAVENOUS | Status: AC | PRN
  Administered 2019-04-12: 12:00:00 500 [IU]
  Filled 2019-04-12: qty 5

## 2019-04-12 MED ORDER — SODIUM CHLORIDE 0.9% FLUSH
10.0000 mL | INTRAVENOUS | Status: DC | PRN
  Administered 2019-04-12: 10 mL via INTRAVENOUS
  Filled 2019-04-12: qty 10

## 2019-04-12 MED ORDER — SODIUM CHLORIDE 0.9 % IV SOLN
Freq: Once | INTRAVENOUS | Status: AC
  Administered 2019-04-12: 10:00:00 via INTRAVENOUS
  Filled 2019-04-12: qty 250

## 2019-04-12 MED ORDER — SODIUM CHLORIDE 0.9% FLUSH
10.0000 mL | INTRAVENOUS | Status: DC | PRN
  Administered 2019-04-12: 10 mL
  Filled 2019-04-12: qty 10

## 2019-04-12 NOTE — Patient Instructions (Signed)

## 2019-04-12 NOTE — Patient Instructions (Signed)
[facility] Discharge Instructions for Patients Receiving Chemotherapy  Today you received the following chemotherapy agents Opdivo  To help prevent nausea and vomiting after your treatment, we encourage you to take your nausea medication as directed   If you develop nausea and vomiting that is not controlled by your nausea medication, call the clinic.   BELOW ARE SYMPTOMS THAT SHOULD BE REPORTED IMMEDIATELY:  *FEVER GREATER THAN 100.5 F  *CHILLS WITH OR WITHOUT FEVER  NAUSEA AND VOMITING THAT IS NOT CONTROLLED WITH YOUR NAUSEA MEDICATION  *UNUSUAL SHORTNESS OF BREATH  *UNUSUAL BRUISING OR BLEEDING  TENDERNESS IN MOUTH AND THROAT WITH OR WITHOUT PRESENCE OF ULCERS  *URINARY PROBLEMS  *BOWEL PROBLEMS  UNUSUAL RASH Items with * indicate a potential emergency and should be followed up as soon as possible.  Feel free to call the clinic should you have any questions or concerns. The clinic phone number is [phone].  Please show the CHEMO ALERT CARD at check-in to the Emergency Department and triage nurse.   

## 2019-04-12 NOTE — Progress Notes (Signed)
[facility] Telephone:([phone]) [phone]   Fax:([phone]) [phone]  OFFICE PROGRESS NOTE  Harris, [name], [name] [address] [place] [address]  DIAGNOSIS: Metastatic non-small cell lung cancer, adenocarcinoma. This was initially diagnosed as stage IB (T2a, N0, M0) in March of 2014, status post right upper lobectomy with lymph node dissection but the patient has evidence for disease recurrence in the right axilla status post resection.  PRIOR THERAPY:  1) Status post right axillary lymph node biopsy on 05/01/2014 under the care of Dr. Roxan Hockey. 2) Systemic chemotherapy with carboplatin for AUC of 5 and Alimta 500 mg/M2 every 3 weeks. First cycle 05/31/2014. Status post 6 cycles. Last dose was given 09/22/2014. 3) palliative radiotherapy to the subcarinal lymph node under the care of Dr. Sondra Come. 4) Nivolumab 240 mg IV every 2 weeks. First dose 12/23/2016. Status post 6 cycles.  CURRENT THERAPY:  Nivolumab 480 mg IV every 4 weeks. First dose 03/17/2017. Status post 27 cycles.  INTERVAL HISTORY: Nancy Hodges 73 y.o. female returns back to the clinic today for follow-up visit.  The patient is feeling fine today with no concerning complaints.  She gained 3 pounds since her last visit.  She denied having any chest pain, shortness of breath, cough or hemoptysis.  She denied having any fever or chills.  She has no nausea, vomiting, diarrhea or constipation.  She has no headache or visual changes.  She is here today for evaluation before starting cycle #28.  MEDICAL HISTORY: Past Medical History:  Diagnosis Date  . Allergic rhinitis   . Anxiety   . Arthritis   . Asthma   . Atrial flutter ([place])   . Cholelithiases 09/26/2015  . Diverticulosis   . Encounter for antineoplastic immunotherapy 12/09/2016  . GERD (gastroesophageal reflux disease)   . H/O hiatal hernia   . History of blood transfusion   . History of chemotherapy   . History of kidney stones   . History of  radiation therapy   . Internal hemorrhoid   . Lung cancer ([place])    a. Stage IB non-small cell carcinoma, s/p R VATS, wedge resection, RU lobectomy 10/2012.  . Paroxysmal atrial fibrillation ([facility])    a. anticoagulated with Xarelto  . Pneumonia 06/2016   [facility]  . Presence of permanent cardiac pacemaker   . Pulmonary nodule   . Tachycardia-bradycardia syndrome ([place])    a. 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    ALLERGIES:  is allergic to chlorpheniramine-dm; prednisone; septra [sulfamethoxazole-trimethoprim]; vioxx [rofecoxib]; and penicillins.  MEDICATIONS:  Current Outpatient Medications  Medication Sig Dispense Refill  . acetaminophen (TYLENOL) 500 MG tablet Take 500 mg by mouth 3 (three) times daily as needed for moderate pain or fever.     [name] albuterol (PROVENTIL HFA;VENTOLIN HFA) 108 (90 BASE) MCG/ACT inhaler Inhale 2 puffs into the lungs every 6 (six) hours as needed for wheezing or shortness of breath.     [name] azelastine (ASTELIN) 0.1 % nasal spray Place 1 spray into both nostrils 2 (two) times daily. Use in each nostril as directed    . calcium carbonate (TUMS - DOSED IN MG ELEMENTAL CALCIUM) 500 MG chewable tablet Chew 1 tablet by mouth 2 (two) times daily as needed for indigestion or heartburn.     . diltiazem (CARTIA XT) 240 MG 24 hr capsule Take 1 capsule (240 mg total) by mouth daily. Please make yearly appt with Dr. [name] for October for future refills. 1st attempt 98  capsule 0  . dimenhyDRINATE (DRAMAMINE) 50 MG tablet Take 12.5 mg by mouth every 8 (eight) hours as needed for dizziness.    [name] ipratropium-albuterol (DUONEB) 0.5-2.5 (3) MG/3ML SOLN Take 3 mLs by nebulization every 6 (six) hours as needed (shortness of breath or wheezing).     [name] lidocaine-prilocaine (EMLA) cream Apply 1 application topically as needed. (Patient taking differently: Apply 1 application topically as needed (for port). ) 30 g 4  . loratadine (CLARITIN) 10 MG tablet Take 10 mg by mouth  daily.    . ondansetron (ZOFRAN ODT) 4 MG disintegrating tablet Take 1 tablet (4 mg total) by mouth every 8 (eight) hours as needed for nausea or vomiting. 20 tablet 0  . OVER THE COUNTER MEDICATION Take 5 mLs by mouth daily as needed (cough). Hyland's kids cough syrup    . Polyvinyl Alcohol-Povidone (REFRESH OP) Place 1 drop into both eyes daily as needed (dry eyes).    [name] tablet TAKE 1 TABLET(20 MG) BY MOUTH DAILY WITH SUPPER 30 tablet 5  . metoprolol tartrate (LOPRESSOR) 100 MG tablet Take 1 tablet (100 mg total) by mouth once for 1 dose. Take one tablet 2 hours prior to cardiac CT 1 tablet 0   No current facility-administered medications for this visit.     SURGICAL HISTORY:  Past Surgical History:  Procedure Laterality Date  . CARDIOVERSION N/A 02/19/2018   Procedure: CARDIOVERSION;  Surgeon: Sanda Klein, MD;  Location: [facility] ENDOSCOPY;  Service: Cardiovascular;  Laterality: N/A;  . CARDIOVERSION N/A 04/30/2018   Procedure: CARDIOVERSION;  Surgeon: Thayer Headings, MD;  Location: [facility] ENDOSCOPY;  Service: Cardiovascular;  Laterality: N/A;  . CATARACT EXTRACTION W/PHACO Right 08/14/2016   Procedure: CATARACT EXTRACTION PHACO AND INTRAOCULAR LENS PLACEMENT RIGHT EYE CDE 10.53;  Surgeon: Tonny Branch, MD;  Location: [facility];  Service: Ophthalmology;  Laterality: Right;  right  . CATARACT EXTRACTION W/PHACO Left 09/25/2016   Procedure: CATARACT EXTRACTION PHACO AND INTRAOCULAR LENS PLACEMENT (IOC);  Surgeon: Tonny Branch, MD;  Location: [facility];  Service: Ophthalmology;  Laterality: Left;  left CDE 8.81  . COLONOSCOPY     [facility]: cattered sigmoid diverticula, internal hemorrhoids, sessile polyp in ascending and mid transverse colon (tubular adenoma).   . colonoscopy with polypectomy  2015  . ESOPHAGOGASTRODUODENOSCOPY     [facility]: bile reflux in stomach, mild gastritis, hiatal hernia  . ESOPHAGOGASTRODUODENOSCOPY N/A 01/31/2016   Procedure: ESOPHAGOGASTRODUODENOSCOPY (EGD);   Surgeon: [name], MD;  [facility];  Service: Endoscopy;  Laterality: N/A;  1200  . IR FLUORO GUIDE PORT INSERTION RIGHT  06/30/2017  . IR US GUIDE VASC ACCESS RIGHT  06/30/2017  . LOBECTOMY Right 10/27/2012   Procedure: LOBECTOMY;  Surgeon: [name], MD;  Location: [facility];  Service: Thoracic;  Laterality: Right;   RIGHT UPPER LOBECTOMY & Node Dissection  . LYMPH NODE BIOPSY Right 05/01/2014   Procedure: LYMPH NODE BIOPSY, Right Axillary;  Surgeon: [name], MD;  Location: [facility];  Service: Thoracic;  Laterality: Right;  . PACEMAKER IMPLANT N/A 10/30/2017   SJM Assurity MRI PPM implanted by Dr [name] once leadless pacemaker battery depleted  . PARTIAL HYSTERECTOMY    . PERMANENT PACEMAKER INSERTION N/A 09/14/2012   Nanostim (SJM) leadless pacemaker (LEADLESS II STUDY PATIENT) implanted by Dr [name], no longer functioning, device abandoned.  [name] VIDEO ASSISTED THORACOSCOPY (VATS)/WEDGE RESECTION Right 10/27/2012   Procedure: VIDEO ASSISTED THORACOSCOPY (VATS),RGHT UPPER LOBE LUNG WEDGE RESECTION;  Surgeon: Revonda Standard  Roxan Hockey, MD;  Location: [facility] OR;  Service: Thoracic;  Laterality: Right;    REVIEW OF SYSTEMS:  A comprehensive review of systems was negative.   PHYSICAL EXAMINATION: General appearance: alert, cooperative and no distress Head: Normocephalic, without obvious abnormality, atraumatic Neck: no adenopathy, no JVD, supple, symmetrical, trachea midline and thyroid not enlarged, symmetric, no tenderness/mass/nodules Lymph nodes: Cervical, supraclavicular, and axillary nodes normal. Resp: clear to auscultation bilaterally Back: symmetric, no curvature. ROM normal. No CVA tenderness. Cardio: regular rate and rhythm, S1, S2 normal, no murmur, click, rub or gallop GI: soft, non-tender; bowel sounds normal; no masses,  no organomegaly Extremities: extremities normal, atraumatic, no cyanosis or edema  ECOG PERFORMANCE STATUS: 1 - Symptomatic but  completely ambulatory  Blood pressure (!) 133/41, pulse 67, temperature 98.7 F (37.1 C), temperature source Oral, resp. rate 18, height 5\' 2"  (1.575 m), weight 152 lb 3.2 oz (69 kg), SpO2 97 %.  LABORATORY DATA: Lab Results  Component Value Date   WBC 6.9 04/12/2019   HGB 13.3 04/12/2019   HCT 39.2 04/12/2019   MCV 97.3 04/12/2019   PLT 197 04/12/2019      Chemistry      Component Value Date/Time   NA 139 03/15/2019 1120   NA 139 08/05/2017 0902   K 4.2 03/15/2019 1120   K 4.1 08/05/2017 0902   CL 105 03/15/2019 1120   CO2 26 03/15/2019 1120   CO2 28 08/05/2017 0902   BUN 14 03/15/2019 1120   BUN 10.1 08/05/2017 0902   CREATININE 0.69 03/15/2019 1120   CREATININE 0.8 08/05/2017 0902   GLU 134 (H) 02/18/2017 1410      Component Value Date/Time   CALCIUM 9.3 03/15/2019 1120   CALCIUM 9.5 08/05/2017 0902   ALKPHOS 86 03/15/2019 1120   ALKPHOS 111 08/05/2017 0902   AST 32 03/15/2019 1120   AST 33 08/05/2017 0902   ALT 25 03/15/2019 1120   ALT 34 08/05/2017 0902   BILITOT 1.1 03/15/2019 1120   BILITOT 1.39 (H) 08/05/2017 0902       RADIOGRAPHIC STUDIES: Mm 3d Screen Breast Bilateral  Result Date: 03/30/2019 CLINICAL DATA:  Screening. EXAM: DIGITAL SCREENING BILATERAL MAMMOGRAM WITH TOMO AND CAD COMPARISON:  Previous exam(s). ACR Breast Density Category c: The breast tissue is heterogeneously dense, which may obscure small masses. FINDINGS: There are no findings suspicious for malignancy. Images were processed with CAD. IMPRESSION: No mammographic evidence of malignancy. A result letter of this screening mammogram will be mailed directly to the patient. RECOMMENDATION: Screening mammogram in one year. (Code:SM-B-01Y) BI-RADS CATEGORY  1: Negative. Electronically Signed   By: Margarette Canada M.D.   On: 03/30/2019 15:01   ASSESSMENT AND PLAN:  This is a very pleasant 73 years old white female with metastatic non-small cell lung cancer, adenocarcinoma with no actionable  mutations status post systemic chemotherapy with carboplatin and Alimta and she is currently undergoing treatment with second line immunotherapy with Nivolumab status post 6 cycles. This was followed by immunotherapy with Nivolumab 480 mg IV every 4 weeks status post 27 cycles. The patient has been tolerating this treatment well with no concerning adverse effects. I recommended for her to proceed with cycle #28 today as planned. I will see her back for follow-up visit in 4 weeks for evaluation before starting cycle #29. I would consider her for repeat CT scan of the chest, abdomen pelvis after cycle #29. She was advised to call immediately if she has any concerning symptoms in the interval. The  patient voices understanding of current disease status and treatment options and is in agreement with the current care plan. All questions were answered. The patient knows to call the clinic with any problems, questions or concerns. We can certainly see the patient much sooner if necessary.  Disclaimer: This note was dictated with voice recognition software. Similar sounding words can inadvertently be transcribed and may not be corrected upon review.

## 2019-04-20 IMAGING — DX DG CHEST 2V
2 series · 2 of 2 positions shown · non-contrast
Comparison: Restaging CT Chest, Abdomen, and Pelvis 08/27/2018, and
earlier. Chest radiograph 02/26/2018.

CLINICAL DATA: 72-year-old female with cough. Nausea vomiting. Lung
cancer on chemotherapy.

EXAM:
CHEST - 2 VIEW

[chest pa]
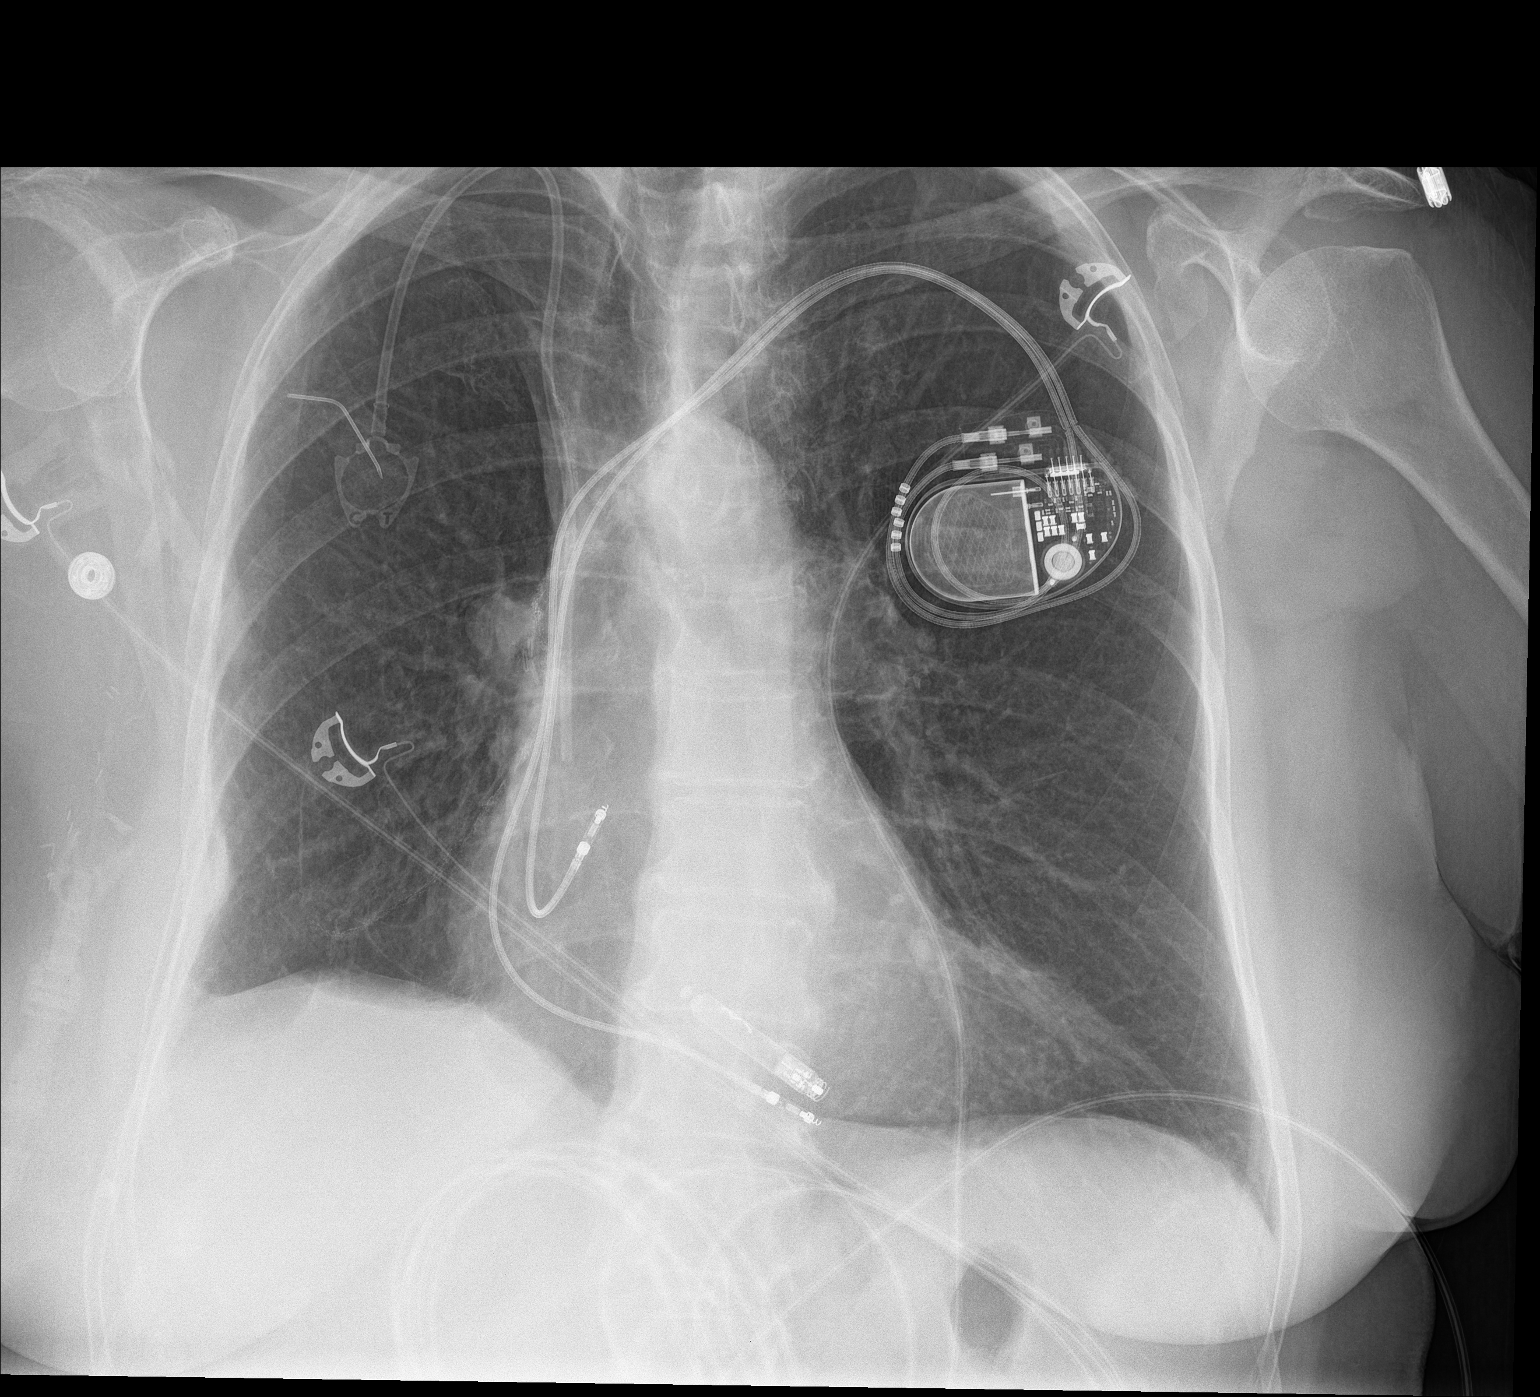

[chest lat]
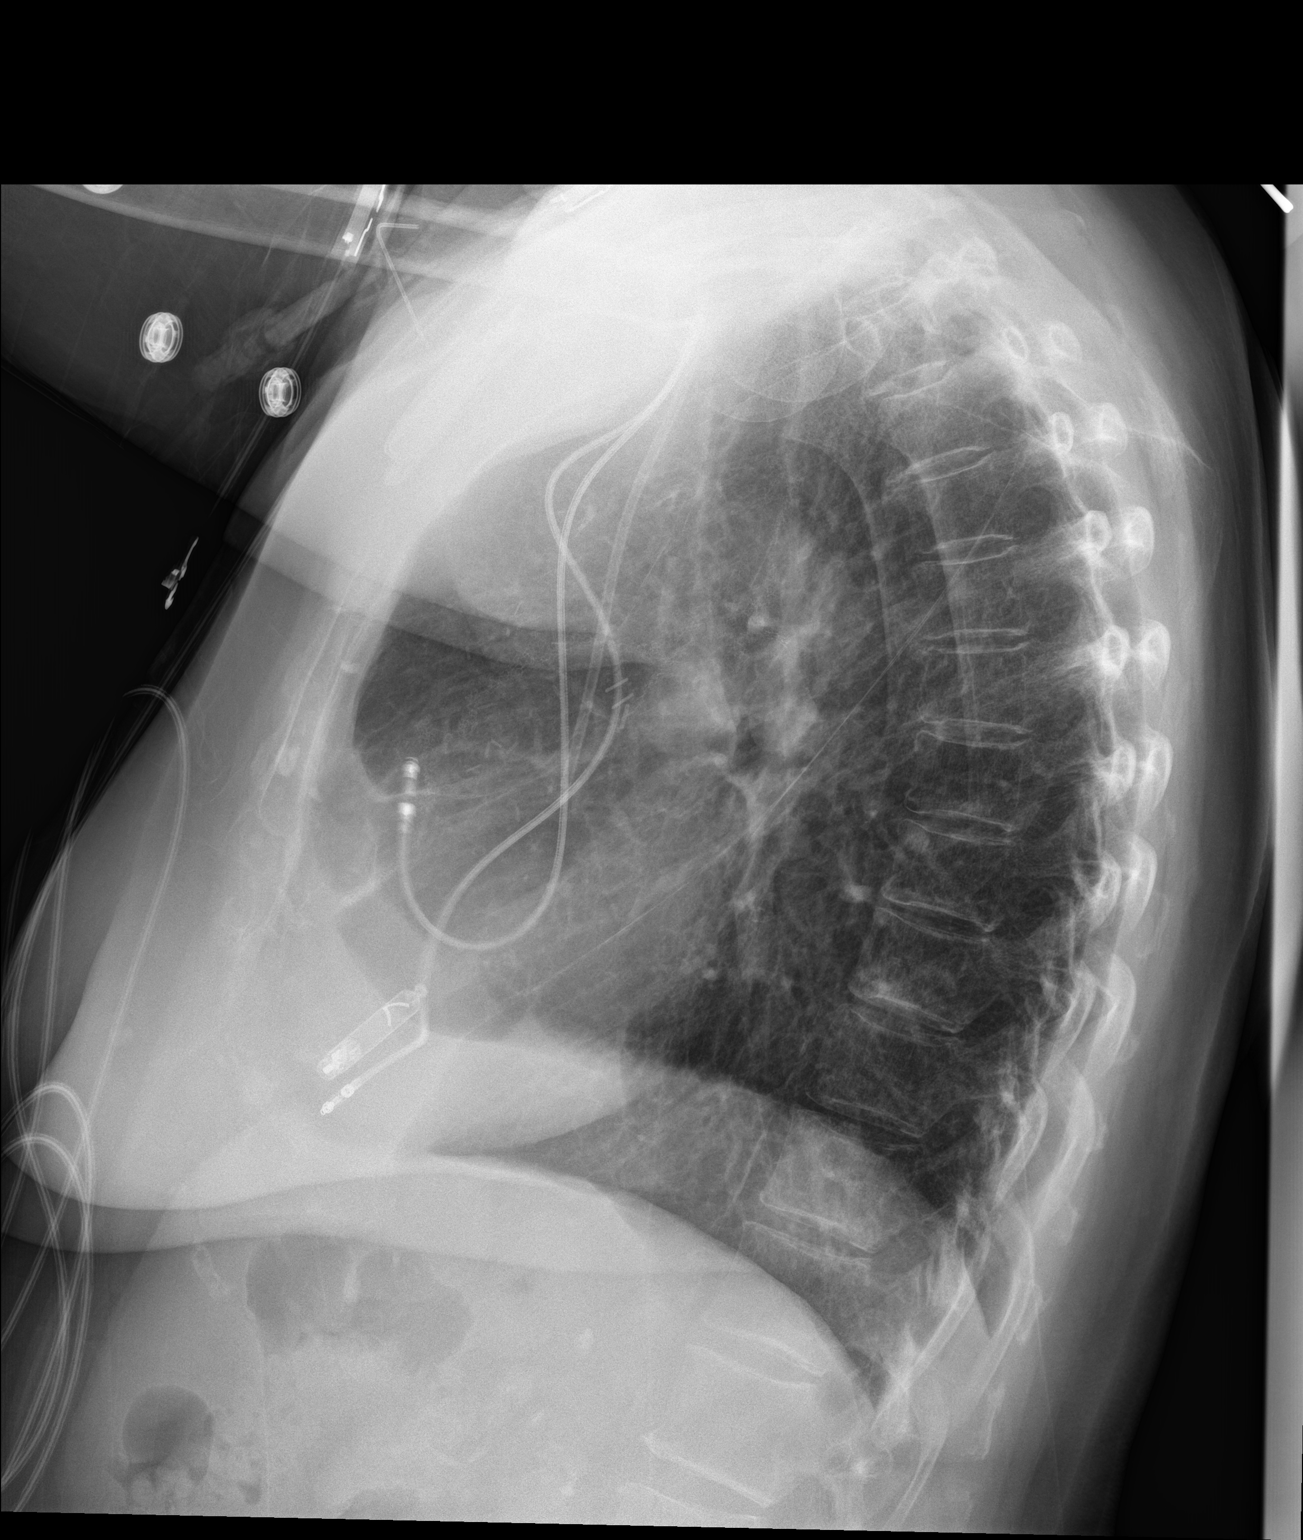

[2 of 2 positions shown; findings below may reference images not displayed]

FINDINGS: Postoperative changes to the right lung with stable lung volumes and
stable/normal mediastinal contours. Right chest power port remains
in place and is accessed. Stable left chest cardiac pacemaker and
loop recorder. Visualized tracheal air column is within normal
limits. No pneumothorax, pulmonary edema, pleural effusion or
confluent pulmonary opacity. No acute osseous abnormality
identified. Negative visible bowel gas pattern.
IMPRESSION: Stable chest.  No acute cardiopulmonary abnormality.

## 2019-05-02 ENCOUNTER — Telehealth (INDEPENDENT_AMBULATORY_CARE_PROVIDER_SITE_OTHER): Payer: Medicare Other | Admitting: Internal Medicine

## 2019-05-02 ENCOUNTER — Encounter: Payer: Self-pay | Admitting: Internal Medicine

## 2019-05-02 ENCOUNTER — Other Ambulatory Visit: Payer: Self-pay

## 2019-05-02 VITALS — BP 142/67 | HR 83 | Ht 62.0 in | Wt 152.0 lb

## 2019-05-02 DIAGNOSIS — I495 Sick sinus syndrome: Secondary | ICD-10-CM | POA: Diagnosis not present

## 2019-05-02 DIAGNOSIS — I4819 Other persistent atrial fibrillation: Secondary | ICD-10-CM

## 2019-05-02 NOTE — Progress Notes (Signed)
Electrophysiology TeleHealth Note  Due to national recommendations of social distancing due to [place] 19, an audio telehealth visit is felt to be most appropriate for this patient at this time.  Verbal consent was obtained by me for the telehealth visit today.  The patient does not have capability for a virtual visit.  A phone visit is therefore required today.   Date:  05/02/2019   ID:  Nancy Hodges, DOB 1945-12-09, MRN [id number]  Location: patient's home  Provider location:  [facility]  Evaluation Performed: Follow-up visit  PCP:  [name], [name]   Electrophysiologist:  Dr [name]  Chief Complaint:  palpitations  History of Present Illness:    Nancy Hodges is a 73 y.o. female who presents via telehealth conferencing today.  Since last being seen in our clinic, the patient reports doing very well.  Today, she denies symptoms of palpitations, chest pain, shortness of breath,  lower extremity edema, dizziness, presyncope, or syncope.  The patient is otherwise without complaint today.  The patient denies symptoms of fevers, chills, cough, or new SOB worrisome for COVID 19.  Past Medical History:  Diagnosis Date  . Allergic rhinitis   . Anxiety   . Arthritis   . Asthma   . Atrial flutter ([place])   . Cholelithiases 09/26/2015  . Diverticulosis   . Encounter for antineoplastic immunotherapy 12/09/2016  . GERD (gastroesophageal reflux disease)   . H/O hiatal hernia   . History of blood transfusion   . History of chemotherapy   . History of kidney stones   . History of radiation therapy   . Internal hemorrhoid   . Lung cancer ([place])    a. Stage IB non-small cell carcinoma, s/p R VATS, wedge resection, RU lobectomy 10/2012.  . Paroxysmal atrial fibrillation ([facility])    a. anticoagulated with Xarelto  . Pneumonia 06/2016   [facility]  . Presence of permanent cardiac pacemaker   . Pulmonary nodule   . Tachycardia-bradycardia syndrome ([place])    a. s/p  Nanostim Leadless pacemaker 09/2012.    Past Surgical History:  Procedure Laterality Date  . CARDIOVERSION N/A 02/19/2018   Procedure: CARDIOVERSION;  Surgeon: Sanda Klein, MD;  Location: [facility] ENDOSCOPY;  Service: Cardiovascular;  Laterality: N/A;  . CARDIOVERSION N/A 04/30/2018   Procedure: CARDIOVERSION;  Surgeon: Thayer Headings, MD;  Location: [facility] ENDOSCOPY;  Service: Cardiovascular;  Laterality: N/A;  . CATARACT EXTRACTION W/PHACO Right 08/14/2016   Procedure: CATARACT EXTRACTION PHACO AND INTRAOCULAR LENS PLACEMENT RIGHT EYE CDE 10.53;  Surgeon: Tonny Branch, MD;  Location: [facility];  Service: Ophthalmology;  Laterality: Right;  right  . CATARACT EXTRACTION W/PHACO Left 09/25/2016   Procedure: CATARACT EXTRACTION PHACO AND INTRAOCULAR LENS PLACEMENT (IOC);  Surgeon: Tonny Branch, MD;  [facility]: [facility];  Service: Ophthalmology;  Laterality: Left;  left CDE 8.81  . COLONOSCOPY     [facility]: cattered sigmoid diverticula, internal hemorrhoids, sessile polyp in ascending and mid transverse colon (tubular adenoma).   . colonoscopy with polypectomy  2015  . ESOPHAGOGASTRODUODENOSCOPY     [facility]: bile reflux in stomach, mild gastritis, hiatal hernia  . ESOPHAGOGASTRODUODENOSCOPY N/A 01/31/2016   Procedure: ESOPHAGOGASTRODUODENOSCOPY (EGD);  Surgeon: [name], MD;  [facility];  Service: Endoscopy;  Laterality: N/A;  1200  . IR FLUORO GUIDE PORT INSERTION RIGHT  06/30/2017  . IR US GUIDE VASC ACCESS RIGHT  06/30/2017  . LOBECTOMY Right 10/27/2012   Procedure: LOBECTOMY;  Surgeon: [name], MD;  [facility];  Service: Thoracic;  Laterality: Right;   RIGHT UPPER LOBECTOMY & Node Dissection  . LYMPH NODE BIOPSY Right 05/01/2014   Procedure: LYMPH NODE BIOPSY, Right Axillary;  Surgeon: [name], MD;  Location: [facility];  Service: Thoracic;  Laterality: Right;  . PACEMAKER IMPLANT N/A 10/30/2017   SJM Assurity MRI PPM implanted by Dr [name] once leadless pacemaker  battery depleted  . PARTIAL HYSTERECTOMY    . PERMANENT PACEMAKER INSERTION N/A 09/14/2012   Nanostim (SJM) leadless pacemaker (LEADLESS II STUDY PATIENT) implanted by Dr [name], no longer functioning, device abandoned.  [name] VIDEO ASSISTED THORACOSCOPY (VATS)/WEDGE RESECTION Right 10/27/2012   Procedure: VIDEO ASSISTED THORACOSCOPY (VATS),RGHT UPPER LOBE LUNG WEDGE RESECTION;  Surgeon: [name], MD;  Location: [facility];  Service: Thoracic;  Laterality: Right;    Current Outpatient Medications  Medication Sig Dispense Refill  . acetaminophen (TYLENOL) 500 MG tablet Take 500 mg by mouth 3 (three) times daily as needed for moderate pain or fever.     [name] albuterol (PROVENTIL HFA;VENTOLIN HFA) 108 (90 BASE) MCG/ACT inhaler Inhale 2 puffs into the lungs every 6 (six) hours as needed for wheezing or shortness of breath.     [name] azelastine (ASTELIN) 0.1 % nasal spray Place 1 spray into both nostrils 2 (two) times daily. Use in each nostril as directed    . calcium carbonate (TUMS - DOSED IN MG ELEMENTAL CALCIUM) 500 MG chewable tablet Chew 1 tablet by mouth 2 (two) times daily as needed for indigestion or heartburn.     . diltiazem (CARTIA XT) 240 MG 24 hr capsule Take 1 capsule (240 mg total) by mouth daily. Please make yearly appt with Dr. [name] for October for future refills. 1st attempt 90 capsule 0  . dimenhyDRINATE (DRAMAMINE) 50 MG tablet Take 12.5 mg by mouth every 8 (eight) hours as needed for dizziness.    [name] ipratropium-albuterol (DUONEB) 0.5-2.5 (3) MG/3ML SOLN Take 3 mLs by nebulization every 6 (six) hours as needed (shortness of breath or wheezing).     [name] lidocaine-prilocaine (EMLA) cream Apply 1 application topically as needed. (Patient taking differently: Apply 1 application topically as needed (for port). ) 30 g 4  . loratadine (CLARITIN) 10 MG tablet Take 10 mg by mouth daily.    [name] OVER THE COUNTER MEDICATION Take 5 mLs by mouth daily as needed (cough). Hyland's kids cough syrup    .  Polyvinyl Alcohol-Povidone (REFRESH OP) Place 1 drop into both eyes daily as needed (dry eyes).    [name] tablet TAKE 1 TABLET(20 MG) BY MOUTH DAILY WITH SUPPER 30 tablet 5   No current facility-administered medications for this visit.     Allergies:   Chlorpheniramine-dm, Prednisone, Septra [sulfamethoxazole-trimethoprim], Vioxx [rofecoxib], and Penicillins   Social History:  The patient  reports that she quit smoking about 23 years ago. Her smoking use included cigarettes. She has a 80.00 pack-year smoking history. She has never used smokeless tobacco. She reports that she does not drink alcohol or use drugs.   Family History:  The patient's family history includes Asthma in her father; Diabetes in her brother, mother, and sister; Heart attack in her son; Heart disease in her father and mother; Kidney cancer in her mother; Lung cancer in her sister; Pancreatic cancer in her brother.   ROS:  Please see the history of present illness.   All other systems are personally reviewed and negative.    Exam:    Vital  Signs:  BP (!) 142/67   Pulse 83   Ht 5\' 2"  (1.575 m)   Wt 152 lb (68.9 kg)   BMI 27.80 kg/m   Well sounding, alert and conversant   Labs/Other Tests and Data Reviewed:    Recent Labs: 04/12/2019: ALT 41; BUN 12; Creatinine 0.72; Hemoglobin 13.3; Platelet Count 197; Potassium 4.7; Sodium 139; TSH 5.620   Wt Readings from Last 3 Encounters:  05/02/19 152 lb (68.9 kg)  04/12/19 152 lb 3.2 oz (69 kg)  03/15/19 148 lb 9 oz (67.4 kg)     Last device remote is reviewed from [facility] PDF which reveals normal device function,afib burden is 54%   ASSESSMENT & PLAN:    1.  Persistent atrial fibrillation She is in afib most of the time.  She is not ready to proceed with ablation due to COVID concerns. She is rate controlled.  Symptomatic are ok. chads2vasc score is 3.  Continue xarelto  2. Sinus bradycardia Normal pacemaker function Remotes are uptodate    Follow-up:  Remotes Return to see EP NP in 3 months   Patient Risk:  after full review of this patients clinical status, I feel that they are at moderate risk at this time.  Today, I have spent 15 minutes with the patient with telehealth technology discussing arrhythmia management .    Army Fossa, MD  05/02/2019 2:34 PM     [facility] [address] [place] [address] [phone] (office) [phone] (fax)

## 2019-05-03 ENCOUNTER — Ambulatory Visit (INDEPENDENT_AMBULATORY_CARE_PROVIDER_SITE_OTHER): Payer: Medicare Other | Admitting: *Deleted

## 2019-05-03 DIAGNOSIS — I495 Sick sinus syndrome: Secondary | ICD-10-CM

## 2019-05-03 DIAGNOSIS — I4819 Other persistent atrial fibrillation: Secondary | ICD-10-CM | POA: Diagnosis not present

## 2019-05-03 LAB — CUP PACEART REMOTE DEVICE CHECK
Battery Remaining Longevity: 120 mo
Battery Remaining Percentage: 95.5 %
Battery Voltage: 3.01 V
Brady Statistic AP VP Percent: 1 %
Brady Statistic AP VS Percent: 1.1 %
Brady Statistic AS VP Percent: 1 %
Brady Statistic AS VS Percent: 99 %
Brady Statistic RA Percent Paced: 1 %
Brady Statistic RV Percent Paced: 17 %
Date Time Interrogation Session: 20200928060013
Implantable Lead Implant Date: 20190329
Implantable Lead Implant Date: 20190329
Implantable Lead Location: 753859
Implantable Lead Location: 753860
Implantable Pulse Generator Implant Date: 20190329
Lead Channel Impedance Value: 410 Ohm
Lead Channel Impedance Value: 490 Ohm
Lead Channel Pacing Threshold Amplitude: 1 V
Lead Channel Pacing Threshold Amplitude: 1 V
Lead Channel Pacing Threshold Pulse Width: 0.5 ms
Lead Channel Pacing Threshold Pulse Width: 0.5 ms
Lead Channel Sensing Intrinsic Amplitude: 12 mV
Lead Channel Sensing Intrinsic Amplitude: 2.1 mV
Lead Channel Setting Pacing Amplitude: 2 V
Lead Channel Setting Pacing Amplitude: 2.5 V
Lead Channel Setting Pacing Pulse Width: 0.5 ms
Lead Channel Setting Sensing Sensitivity: 2 mV
Pulse Gen Model: 2272
Pulse Gen Serial Number: 9003317

## 2019-05-09 ENCOUNTER — Other Ambulatory Visit: Payer: Self-pay | Admitting: *Deleted

## 2019-05-09 DIAGNOSIS — C3491 Malignant neoplasm of unspecified part of right bronchus or lung: Secondary | ICD-10-CM

## 2019-05-10 ENCOUNTER — Telehealth: Payer: Self-pay | Admitting: Internal Medicine

## 2019-05-10 ENCOUNTER — Inpatient Hospital Stay: Payer: Medicare Other

## 2019-05-10 ENCOUNTER — Other Ambulatory Visit: Payer: Self-pay | Admitting: *Deleted

## 2019-05-10 ENCOUNTER — Other Ambulatory Visit: Payer: Self-pay | Admitting: Internal Medicine

## 2019-05-10 ENCOUNTER — Inpatient Hospital Stay: Payer: Medicare Other | Attending: Internal Medicine

## 2019-05-10 ENCOUNTER — Encounter: Payer: Self-pay | Admitting: Internal Medicine

## 2019-05-10 ENCOUNTER — Other Ambulatory Visit: Payer: Self-pay

## 2019-05-10 ENCOUNTER — Inpatient Hospital Stay (HOSPITAL_BASED_OUTPATIENT_CLINIC_OR_DEPARTMENT_OTHER): Payer: Medicare Other | Admitting: Internal Medicine

## 2019-05-10 VITALS — BP 125/58 | HR 64 | Temp 98.9°F | Resp 17 | Ht 62.0 in | Wt 152.4 lb

## 2019-05-10 DIAGNOSIS — Z23 Encounter for immunization: Secondary | ICD-10-CM | POA: Diagnosis not present

## 2019-05-10 DIAGNOSIS — C3491 Malignant neoplasm of unspecified part of right bronchus or lung: Secondary | ICD-10-CM

## 2019-05-10 DIAGNOSIS — C3490 Malignant neoplasm of unspecified part of unspecified bronchus or lung: Secondary | ICD-10-CM

## 2019-05-10 DIAGNOSIS — Z5112 Encounter for antineoplastic immunotherapy: Secondary | ICD-10-CM

## 2019-05-10 DIAGNOSIS — C773 Secondary and unspecified malignant neoplasm of axilla and upper limb lymph nodes: Secondary | ICD-10-CM | POA: Diagnosis present

## 2019-05-10 DIAGNOSIS — C3411 Malignant neoplasm of upper lobe, right bronchus or lung: Secondary | ICD-10-CM | POA: Insufficient documentation

## 2019-05-10 DIAGNOSIS — Z95828 Presence of other vascular implants and grafts: Secondary | ICD-10-CM

## 2019-05-10 LAB — CBC WITH DIFFERENTIAL (CANCER CENTER ONLY)
Abs Immature Granulocytes: 0.02 10*3/uL (ref 0.00–0.07)
Basophils Absolute: 0.1 10*3/uL (ref 0.0–0.1)
Basophils Relative: 1 %
Eosinophils Absolute: 0.1 10*3/uL (ref 0.0–0.5)
Eosinophils Relative: 1 %
HCT: 38.7 % (ref 36.0–46.0)
Hemoglobin: 13.5 g/dL (ref 12.0–15.0)
Immature Granulocytes: 0 %
Lymphocytes Relative: 17 %
Lymphs Abs: 1.4 10*3/uL (ref 0.7–4.0)
MCH: 32.8 pg (ref 26.0–34.0)
MCHC: 34.9 g/dL (ref 30.0–36.0)
MCV: 93.9 fL (ref 80.0–100.0)
Monocytes Absolute: 0.6 10*3/uL (ref 0.1–1.0)
Monocytes Relative: 8 %
Neutro Abs: 6 10*3/uL (ref 1.7–7.7)
Neutrophils Relative %: 73 %
Platelet Count: 191 10*3/uL (ref 150–400)
RBC: 4.12 MIL/uL (ref 3.87–5.11)
RDW: 12.7 % (ref 11.5–15.5)
WBC Count: 8.2 10*3/uL (ref 4.0–10.5)
nRBC: 0 % (ref 0.0–0.2)

## 2019-05-10 LAB — CMP (CANCER CENTER ONLY)
ALT: 28 U/L (ref 0–44)
AST: 30 U/L (ref 15–41)
Albumin: 3.8 g/dL (ref 3.5–5.0)
Alkaline Phosphatase: 103 U/L (ref 38–126)
Anion gap: 7 (ref 5–15)
BUN: 10 mg/dL (ref 8–23)
CO2: 28 mmol/L (ref 22–32)
Calcium: 9.4 mg/dL (ref 8.9–10.3)
Chloride: 104 mmol/L (ref 98–111)
Creatinine: 0.72 mg/dL (ref 0.44–1.00)
GFR, Est AFR Am: >60 mL/min (ref >60)
GFR, Estimated: >60 mL/min (ref >60)
Glucose, Bld: 153 mg/dL — ABNORMAL HIGH (ref 70–99)
Potassium: 3.9 mmol/L (ref 3.5–5.1)
Sodium: 139 mmol/L (ref 135–145)
Total Bilirubin: 1.1 mg/dL (ref 0.3–1.2)
Total Protein: 6.9 g/dL (ref 6.5–8.1)

## 2019-05-10 MED ORDER — SODIUM CHLORIDE 0.9% FLUSH
10.0000 mL | INTRAVENOUS | Status: DC | PRN
  Administered 2019-05-10: 10 mL
  Filled 2019-05-10: qty 10

## 2019-05-10 MED ORDER — SODIUM CHLORIDE 0.9 % IV SOLN
480.0000 mg | Freq: Once | INTRAVENOUS | Status: AC
  Administered 2019-05-10: 12:00:00 480 mg via INTRAVENOUS
  Filled 2019-05-10: qty 48

## 2019-05-10 MED ORDER — INFLUENZA VAC A&B SA ADJ QUAD 0.5 ML IM PRSY
PREFILLED_SYRINGE | INTRAMUSCULAR | Status: AC
  Filled 2019-05-10: qty 0.5

## 2019-05-10 MED ORDER — INFLUENZA VAC A&B SA ADJ QUAD 0.5 ML IM PRSY
0.5000 mL | PREFILLED_SYRINGE | Freq: Once | INTRAMUSCULAR | Status: AC
  Administered 2019-05-10: 0.5 mL via INTRAMUSCULAR

## 2019-05-10 MED ORDER — HEPARIN SOD (PORK) LOCK FLUSH 100 UNIT/ML IV SOLN
500.0000 [IU] | Freq: Once | INTRAVENOUS | Status: AC | PRN
  Administered 2019-05-10: 13:00:00 500 [IU]
  Filled 2019-05-10: qty 5

## 2019-05-10 MED ORDER — SODIUM CHLORIDE 0.9 % IV SOLN
Freq: Once | INTRAVENOUS | Status: AC
  Administered 2019-05-10: 12:00:00 via INTRAVENOUS
  Filled 2019-05-10: qty 250

## 2019-05-10 MED ORDER — INFLUENZA VAC A&B SA ADJ QUAD 0.5 ML IM PRSY: 0.5000 mL | PREFILLED_SYRINGE | Freq: Once | INTRAMUSCULAR | Status: DC

## 2019-05-10 MED ORDER — SODIUM CHLORIDE 0.9% FLUSH
10.0000 mL | INTRAVENOUS | Status: DC | PRN
  Administered 2019-05-10: 10 mL via INTRAVENOUS
  Filled 2019-05-10: qty 10

## 2019-05-10 NOTE — Patient Instructions (Signed)
[facility] Discharge Instructions for Patients Receiving Chemotherapy  Today you received the following chemotherapy agent: Opdivo  To help prevent nausea and vomiting after your treatment, we encourage you to take your nausea medication as directed   If you develop nausea and vomiting that is not controlled by your nausea medication, call the clinic.   BELOW ARE SYMPTOMS THAT SHOULD BE REPORTED IMMEDIATELY:  *FEVER GREATER THAN 100.5 F  *CHILLS WITH OR WITHOUT FEVER  NAUSEA AND VOMITING THAT IS NOT CONTROLLED WITH YOUR NAUSEA MEDICATION  *UNUSUAL SHORTNESS OF BREATH  *UNUSUAL BRUISING OR BLEEDING  TENDERNESS IN MOUTH AND THROAT WITH OR WITHOUT PRESENCE OF ULCERS  *URINARY PROBLEMS  *BOWEL PROBLEMS  UNUSUAL RASH Items with * indicate a potential emergency and should be followed up as soon as possible.  Feel free to call the clinic should you have any questions or concerns. The clinic phone number is [phone].  Please show the CHEMO ALERT CARD at check-in to the Emergency Department and triage nurse.   

## 2019-05-10 NOTE — Progress Notes (Signed)
[facility] [phone]   [phone]  OFFICE PROGRESS NOTE  Hodges, Nancy L, [name] [address] [place] [address]  DIAGNOSIS: Metastatic non-small cell lung cancer, adenocarcinoma. This was initially diagnosed as stage IB (T2a, N0, M0) in March of 2014, status post right upper lobectomy with lymph node dissection but the patient has evidence for disease recurrence in the right axilla status post resection.  PRIOR THERAPY:  1) Status post right axillary lymph node biopsy on 05/01/2014 under the care of Dr. Roxan Hockey. 2) Systemic chemotherapy with carboplatin for AUC of 5 and Alimta 500 mg/M2 every 3 weeks. First cycle 05/31/2014. Status post 6 cycles. Last dose was given 09/22/2014. 3) palliative radiotherapy to the subcarinal lymph node under the care of Dr. Sondra Come. 4) Nivolumab 240 mg IV every 2 weeks. First dose 12/23/2016. Status post 6 cycles.  CURRENT THERAPY:  Nivolumab 480 mg IV every 4 weeks. First dose 03/17/2017. Status post 28 cycles.  INTERVAL HISTORY: Nancy Hodges 73 y.o. female returns to the clinic today for follow-up visit.  The patient is feeling fine today with no concerning complaints.  She denied having any chest pain, shortness of breath, cough or hemoptysis.  She denied having any fever or chills.  She has no nausea, vomiting, diarrhea or constipation.  She denied having any headache or visual changes.  She is here today for evaluation before starting cycle #29 of her treatment.  MEDICAL HISTORY: Past Medical History:  Diagnosis Date  . Allergic rhinitis   . Anxiety   . Arthritis   . Asthma   . Atrial flutter ([facility])   . Cholelithiases 09/26/2015  . Diverticulosis   . Encounter for antineoplastic immunotherapy 12/09/2016  . GERD (gastroesophageal reflux disease)   . H/O hiatal hernia   . History of blood transfusion   . History of chemotherapy   . History of kidney stones   . History of radiation therapy    . Internal hemorrhoid   . Lung cancer ([facility])    a. Stage IB non-small cell carcinoma, s/p R VATS, wedge resection, RU lobectomy 10/2012.  . Paroxysmal atrial fibrillation ([facility])    a. anticoagulated with Xarelto  . Pneumonia 06/2016   [facility]  . Presence of permanent cardiac pacemaker   . Pulmonary nodule   . Tachycardia-bradycardia syndrome ([place])    a. 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    ALLERGIES:  is allergic to chlorpheniramine-dm; prednisone; septra [sulfamethoxazole-trimethoprim]; vioxx [rofecoxib]; and penicillins.  MEDICATIONS:  Current Outpatient Medications  Medication Sig Dispense Refill  . acetaminophen (TYLENOL) 500 MG tablet Take 500 mg by mouth 3 (three) times daily as needed for moderate pain or fever.     [name] albuterol (PROVENTIL HFA;VENTOLIN HFA) 108 (90 BASE) MCG/ACT inhaler Inhale 2 puffs into the lungs every 6 (six) hours as needed for wheezing or shortness of breath.     [name] azelastine (ASTELIN) 0.1 % nasal spray Place 1 spray into both nostrils 2 (two) times daily. Use in each nostril as directed    . calcium carbonate (TUMS - DOSED IN MG ELEMENTAL CALCIUM) 500 MG chewable tablet Chew 1 tablet by mouth 2 (two) times daily as needed for indigestion or heartburn.     . diltiazem (CARTIA XT) 240 MG 24 hr capsule Take 1 capsule (240 mg total) by mouth daily. Please make yearly appt with Dr. [name] for October for future refills. 1st attempt 90 capsule 0  . dimenhyDRINATE (DRAMAMINE)  50 MG tablet Take 12.5 mg by mouth every 8 (eight) hours as needed for dizziness.    [name] ipratropium-albuterol (DUONEB) 0.5-2.5 (3) MG/3ML SOLN Take 3 mLs by nebulization every 6 (six) hours as needed (shortness of breath or wheezing).     [name] lidocaine-prilocaine (EMLA) cream Apply 1 application topically as needed. (Patient taking differently: Apply 1 application topically as needed (for port). ) 30 g 4  . loratadine (CLARITIN) 10 MG tablet Take 10 mg by mouth daily.    [name] OVER THE  COUNTER MEDICATION Take 5 mLs by mouth daily as needed (cough). [employer] kids cough syrup    . Polyvinyl Alcohol-Povidone (REFRESH OP) Place 1 drop into both eyes daily as needed (dry eyes).    [name] tablet TAKE 1 TABLET(20 MG) BY MOUTH DAILY WITH SUPPER 30 tablet 5   No current facility-administered medications for this visit.    Facility-Administered Medications Ordered in Other Visits  Medication Dose Route Frequency Provider Last Rate Last Dose  . sodium chloride flush (NS) 0.9 % injection 10 mL  10 mL Intravenous PRN Curt Bears, MD   10 mL at 05/10/19 1017    SURGICAL HISTORY:  Past Surgical History:  Procedure Laterality Date  . CARDIOVERSION N/A 02/19/2018   Procedure: CARDIOVERSION;  Surgeon: Sanda Klein, MD;  Location: [facility] [facility] ENDOSCOPY;  Service: Cardiovascular;  Laterality: N/A;  . CARDIOVERSION N/A 04/30/2018   Procedure: CARDIOVERSION;  Surgeon: Thayer Headings, MD;  Location: [facility] ENDOSCOPY;  Service: Cardiovascular;  Laterality: N/A;  . CATARACT EXTRACTION W/PHACO Right 08/14/2016   Procedure: CATARACT EXTRACTION PHACO AND INTRAOCULAR LENS PLACEMENT RIGHT EYE CDE 10.53;  Surgeon: Tonny Branch, MD;  Location: [facility];  Service: Ophthalmology;  Laterality: Right;  right  . CATARACT EXTRACTION W/PHACO Left 09/25/2016   Procedure: CATARACT EXTRACTION PHACO AND INTRAOCULAR LENS PLACEMENT (IOC);  Surgeon: Tonny Branch, MD;  Location: [facility];  Service: Ophthalmology;  Laterality: Left;  left CDE 8.81  . COLONOSCOPY     [facility]: cattered sigmoid diverticula, internal hemorrhoids, sessile polyp in ascending and mid transverse colon (tubular adenoma).   . colonoscopy with polypectomy  2015  . ESOPHAGOGASTRODUODENOSCOPY     [facility]: bile reflux in stomach, mild gastritis, hiatal hernia  . ESOPHAGOGASTRODUODENOSCOPY N/A 01/31/2016   Procedure: ESOPHAGOGASTRODUODENOSCOPY (EGD);  Surgeon: [name], MD;  [facility];  Service: Endoscopy;  Laterality: N/A;   1200  . IR FLUORO GUIDE PORT INSERTION RIGHT  06/30/2017  . IR US GUIDE VASC ACCESS RIGHT  06/30/2017  . LOBECTOMY Right 10/27/2012   Procedure: LOBECTOMY;  Surgeon: [name], MD;  Location: [facility];  Service: Thoracic;  Laterality: Right;   RIGHT UPPER LOBECTOMY & Node Dissection  . LYMPH NODE BIOPSY Right 05/01/2014   Procedure: LYMPH NODE BIOPSY, Right Axillary;  Surgeon: [name], MD;  Location: [facility];  Service: Thoracic;  Laterality: Right;  . PACEMAKER IMPLANT N/A 10/30/2017   SJM Assurity MRI PPM implanted by Dr [name] once leadless pacemaker battery depleted  . PARTIAL HYSTERECTOMY    . PERMANENT PACEMAKER INSERTION N/A 09/14/2012   Nanostim (SJM) leadless pacemaker (LEADLESS II STUDY PATIENT) implanted by Dr [name], no longer functioning, device abandoned.  [name] VIDEO ASSISTED THORACOSCOPY (VATS)/WEDGE RESECTION Right 10/27/2012   Procedure: VIDEO ASSISTED THORACOSCOPY (VATS),RGHT UPPER LOBE LUNG WEDGE RESECTION;  Surgeon: [name], MD;  Location: [facility];  Service: Thoracic;  Laterality: Right;    REVIEW OF SYSTEMS:  A comprehensive review of systems  was negative.   PHYSICAL EXAMINATION: General appearance: alert, cooperative and no distress Head: Normocephalic, without obvious abnormality, atraumatic Neck: no adenopathy, no JVD, supple, symmetrical, trachea midline and thyroid not enlarged, symmetric, no tenderness/mass/nodules Lymph nodes: Cervical, supraclavicular, and axillary nodes normal. Resp: clear to auscultation bilaterally Back: symmetric, no curvature. ROM normal. No CVA tenderness. Cardio: regular rate and rhythm, S1, S2 normal, no murmur, click, rub or gallop GI: soft, non-tender; bowel sounds normal; no masses,  no organomegaly Extremities: extremities normal, atraumatic, no cyanosis or edema  ECOG PERFORMANCE STATUS: 1 - Symptomatic but completely ambulatory  Blood pressure (!) 125/58, pulse 64, temperature 98.9 F (37.2 C), temperature  source Temporal, resp. rate 17, height 5\' 2"  (1.575 m), weight 152 lb 6.4 oz (69.1 kg), SpO2 98 %.  LABORATORY DATA: Lab Results  Component Value Date   WBC 6.9 04/12/2019   HGB 13.3 04/12/2019   HCT 39.2 04/12/2019   MCV 97.3 04/12/2019   PLT 197 04/12/2019      Chemistry      Component Value Date/Time   NA 139 04/12/2019 0857   NA 139 08/05/2017 0902   K 4.7 04/12/2019 0857   K 4.1 08/05/2017 0902   CL 105 04/12/2019 0857   CO2 27 04/12/2019 0857   CO2 28 08/05/2017 0902   BUN 12 04/12/2019 0857   BUN 10.1 08/05/2017 0902   CREATININE 0.72 04/12/2019 0857   CREATININE 0.8 08/05/2017 0902   GLU 134 (H) 02/18/2017 1410      Component Value Date/Time   CALCIUM 9.5 04/12/2019 0857   CALCIUM 9.5 08/05/2017 0902   ALKPHOS 110 04/12/2019 0857   ALKPHOS 111 08/05/2017 0902   AST 47 (H) 04/12/2019 0857   AST 33 08/05/2017 0902   ALT 41 04/12/2019 0857   ALT 34 08/05/2017 0902   BILITOT 0.9 04/12/2019 0857   BILITOT 1.39 (H) 08/05/2017 0902       RADIOGRAPHIC STUDIES: No results found. ASSESSMENT AND PLAN:  This is a very pleasant 73 years old white female with metastatic non-small cell lung cancer, adenocarcinoma with no actionable mutations status post systemic chemotherapy with carboplatin and Alimta and she is currently undergoing treatment with second line immunotherapy with Nivolumab status post 6 cycles. This was followed by immunotherapy with Nivolumab 480 mg IV every 4 weeks status post 28 cycles. The patient has been tolerating her treatment well with no concerning adverse effects. I recommended for her to proceed with cycle #29 today as planned. I will see her back for follow-up visit in 4 weeks for evaluation with repeat CT scan of the chest, abdomen pelvis for restaging of her disease. The patient will receive flu vaccine today. She was advised to call immediately if she has any concerning symptoms in the interval. The patient voices understanding of current  disease status and treatment options and is in agreement with the current care plan. All questions were answered. The patient knows to call the clinic with any problems, questions or concerns. We can certainly see the patient much sooner if necessary.  Disclaimer: This note was dictated with voice recognition software. Similar sounding words can inadvertently be transcribed and may not be corrected upon review.

## 2019-05-10 NOTE — Telephone Encounter (Signed)
Scheduled per 10/06 los, patient receive calender and after visit summary.

## 2019-05-11 ENCOUNTER — Telehealth: Payer: Self-pay | Admitting: Internal Medicine

## 2019-05-11 NOTE — Telephone Encounter (Signed)
Scheduled appt per 10/6 los - added additional cycles - pt to get an updated schedule next visit.

## 2019-05-13 NOTE — Progress Notes (Signed)
Remote pacemaker transmission.   

## 2019-06-03 ENCOUNTER — Encounter (HOSPITAL_COMMUNITY): Payer: Self-pay

## 2019-06-03 ENCOUNTER — Ambulatory Visit (HOSPITAL_COMMUNITY)
Admission: RE | Admit: 2019-06-03 | Discharge: 2019-06-03 | Disposition: A | Discharge status: Home / Self Care | Payer: Medicare Other | Source: Ambulatory Visit | Attending: Internal Medicine | Admitting: Internal Medicine

## 2019-06-03 ENCOUNTER — Other Ambulatory Visit: Payer: Self-pay

## 2019-06-03 ENCOUNTER — Other Ambulatory Visit: Payer: Medicare Other

## 2019-06-03 DIAGNOSIS — C3490 Malignant neoplasm of unspecified part of unspecified bronchus or lung: Secondary | ICD-10-CM | POA: Diagnosis present

## 2019-06-03 MED ORDER — IOHEXOL 300 MG/ML  SOLN
100.0000 mL | Freq: Once | INTRAMUSCULAR | Status: AC | PRN
  Administered 2019-06-03: 100 mL via INTRAVENOUS

## 2019-06-03 MED ORDER — SODIUM CHLORIDE (PF) 0.9 % IJ SOLN
INTRAMUSCULAR | Status: AC
  Filled 2019-06-03: qty 50

## 2019-06-03 MED ORDER — HEPARIN SOD (PORK) LOCK FLUSH 100 UNIT/ML IV SOLN
INTRAVENOUS | Status: AC
  Administered 2019-06-03: 500 [IU] via INTRAVENOUS
  Filled 2019-06-03: qty 5

## 2019-06-06 ENCOUNTER — Other Ambulatory Visit: Payer: Self-pay | Admitting: *Deleted

## 2019-06-06 DIAGNOSIS — C3490 Malignant neoplasm of unspecified part of unspecified bronchus or lung: Secondary | ICD-10-CM

## 2019-06-06 DIAGNOSIS — C3491 Malignant neoplasm of unspecified part of right bronchus or lung: Secondary | ICD-10-CM

## 2019-06-07 ENCOUNTER — Inpatient Hospital Stay: Payer: Medicare Other | Attending: Internal Medicine

## 2019-06-07 ENCOUNTER — Inpatient Hospital Stay (HOSPITAL_BASED_OUTPATIENT_CLINIC_OR_DEPARTMENT_OTHER): Payer: Medicare Other | Admitting: Internal Medicine

## 2019-06-07 ENCOUNTER — Other Ambulatory Visit: Payer: Self-pay

## 2019-06-07 ENCOUNTER — Inpatient Hospital Stay: Payer: Medicare Other

## 2019-06-07 ENCOUNTER — Other Ambulatory Visit: Payer: Medicare Other

## 2019-06-07 ENCOUNTER — Encounter: Payer: Self-pay | Admitting: Internal Medicine

## 2019-06-07 ENCOUNTER — Telehealth: Payer: Self-pay | Admitting: Internal Medicine

## 2019-06-07 VITALS — BP 131/77 | HR 60 | Temp 98.5°F | Resp 17 | Ht 62.0 in | Wt 153.1 lb

## 2019-06-07 DIAGNOSIS — C3490 Malignant neoplasm of unspecified part of unspecified bronchus or lung: Secondary | ICD-10-CM

## 2019-06-07 DIAGNOSIS — C3491 Malignant neoplasm of unspecified part of right bronchus or lung: Secondary | ICD-10-CM

## 2019-06-07 DIAGNOSIS — C773 Secondary and unspecified malignant neoplasm of axilla and upper limb lymph nodes: Secondary | ICD-10-CM | POA: Insufficient documentation

## 2019-06-07 DIAGNOSIS — Z5112 Encounter for antineoplastic immunotherapy: Secondary | ICD-10-CM | POA: Insufficient documentation

## 2019-06-07 DIAGNOSIS — C3411 Malignant neoplasm of upper lobe, right bronchus or lung: Secondary | ICD-10-CM | POA: Diagnosis present

## 2019-06-07 DIAGNOSIS — Z95828 Presence of other vascular implants and grafts: Secondary | ICD-10-CM

## 2019-06-07 LAB — CBC WITH DIFFERENTIAL (CANCER CENTER ONLY)
Abs Immature Granulocytes: 0.01 10*3/uL (ref 0.00–0.07)
Basophils Absolute: 0.1 10*3/uL (ref 0.0–0.1)
Basophils Relative: 1 %
Eosinophils Absolute: 0.3 10*3/uL (ref 0.0–0.5)
Eosinophils Relative: 3 %
HCT: 41.4 % (ref 36.0–46.0)
Hemoglobin: 14.5 g/dL (ref 12.0–15.0)
Immature Granulocytes: 0 %
Lymphocytes Relative: 24 %
Lymphs Abs: 1.9 10*3/uL (ref 0.7–4.0)
MCH: 33.4 pg (ref 26.0–34.0)
MCHC: 35 g/dL (ref 30.0–36.0)
MCV: 95.4 fL (ref 80.0–100.0)
Monocytes Absolute: 0.7 10*3/uL (ref 0.1–1.0)
Monocytes Relative: 8 %
Neutro Abs: 5.2 10*3/uL (ref 1.7–7.7)
Neutrophils Relative %: 64 %
Platelet Count: 222 10*3/uL (ref 150–400)
RBC: 4.34 MIL/uL (ref 3.87–5.11)
RDW: 12.9 % (ref 11.5–15.5)
WBC Count: 8 10*3/uL (ref 4.0–10.5)
nRBC: 0 % (ref 0.0–0.2)

## 2019-06-07 LAB — CMP (CANCER CENTER ONLY)
ALT: 49 U/L — ABNORMAL HIGH (ref 0–44)
AST: 52 U/L — ABNORMAL HIGH (ref 15–41)
Albumin: 3.9 g/dL (ref 3.5–5.0)
Alkaline Phosphatase: 126 U/L (ref 38–126)
Anion gap: 10 (ref 5–15)
BUN: 12 mg/dL (ref 8–23)
CO2: 25 mmol/L (ref 22–32)
Calcium: 10.3 mg/dL (ref 8.9–10.3)
Chloride: 103 mmol/L (ref 98–111)
Creatinine: 0.79 mg/dL (ref 0.44–1.00)
GFR, Est AFR Am: >60 mL/min (ref >60)
GFR, Estimated: >60 mL/min (ref >60)
Glucose, Bld: 119 mg/dL — ABNORMAL HIGH (ref 70–99)
Potassium: 4.4 mmol/L (ref 3.5–5.1)
Sodium: 138 mmol/L (ref 135–145)
Total Bilirubin: 1.3 mg/dL — ABNORMAL HIGH (ref 0.3–1.2)
Total Protein: 7.3 g/dL (ref 6.5–8.1)

## 2019-06-07 MED ORDER — SODIUM CHLORIDE 0.9 % IV SOLN
Freq: Once | INTRAVENOUS | Status: AC
  Administered 2019-06-07: 12:00:00 via INTRAVENOUS
  Filled 2019-06-07: qty 250

## 2019-06-07 MED ORDER — HEPARIN SOD (PORK) LOCK FLUSH 100 UNIT/ML IV SOLN
500.0000 [IU] | Freq: Once | INTRAVENOUS | Status: AC | PRN
  Administered 2019-06-07: 500 [IU]
  Filled 2019-06-07: qty 5

## 2019-06-07 MED ORDER — SODIUM CHLORIDE 0.9% FLUSH
10.0000 mL | INTRAVENOUS | Status: DC | PRN
  Administered 2019-06-07: 10 mL
  Filled 2019-06-07: qty 10

## 2019-06-07 MED ORDER — SODIUM CHLORIDE 0.9 % IV SOLN
480.0000 mg | Freq: Once | INTRAVENOUS | Status: AC
  Administered 2019-06-07: 13:00:00 480 mg via INTRAVENOUS
  Filled 2019-06-07: qty 48

## 2019-06-07 MED ORDER — SODIUM CHLORIDE 0.9% FLUSH
10.0000 mL | INTRAVENOUS | Status: DC | PRN
  Administered 2019-06-07: 10 mL via INTRAVENOUS
  Filled 2019-06-07: qty 10

## 2019-06-07 NOTE — Progress Notes (Signed)
Per Dr. Julien Nordmann, [place] to treat with Bili  1.3 today.

## 2019-06-07 NOTE — Patient Instructions (Signed)
[facility] Discharge Instructions for Patients Receiving Chemotherapy  Today you received the following chemotherapy agents:  Nivolumab  To help prevent nausea and vomiting after your treatment, we encourage you to take your nausea medication as prescribed.   If you develop nausea and vomiting that is not controlled by your nausea medication, call the clinic.   BELOW ARE SYMPTOMS THAT SHOULD BE REPORTED IMMEDIATELY:  *FEVER GREATER THAN 100.5 F  *CHILLS WITH OR WITHOUT FEVER  NAUSEA AND VOMITING THAT IS NOT CONTROLLED WITH YOUR NAUSEA MEDICATION  *UNUSUAL SHORTNESS OF BREATH  *UNUSUAL BRUISING OR BLEEDING  TENDERNESS IN MOUTH AND THROAT WITH OR WITHOUT PRESENCE OF ULCERS  *URINARY PROBLEMS  *BOWEL PROBLEMS  UNUSUAL RASH Items with * indicate a potential emergency and should be followed up as soon as possible.  Feel free to call the clinic should you have any questions or concerns. The clinic phone number is [phone].  Please show the CHEMO ALERT CARD at check-in to the Emergency Department and triage nurse.   

## 2019-06-07 NOTE — Telephone Encounter (Signed)
Appointments already scheduled per 11/03 los, patient received after visit summary and calender.

## 2019-06-07 NOTE — Progress Notes (Signed)
[place] [phone]   Fax:([phone]) [phone]  OFFICE PROGRESS NOTE  Harris, [name], [name] [address] [place] [address]  DIAGNOSIS: Metastatic non-small cell lung cancer, adenocarcinoma. This was initially diagnosed as stage IB (T2a, N0, M0) in March of 2014, status post right upper lobectomy with lymph node dissection but the patient has evidence for disease recurrence in the right axilla status post resection.  PRIOR THERAPY:  1) Status post right axillary lymph node biopsy on 05/01/2014 under the care of Dr. Roxan Hockey. 2) Systemic chemotherapy with carboplatin for AUC of 5 and Alimta 500 mg/M2 every 3 weeks. First cycle 05/31/2014. Status post 6 cycles. Last dose was given 09/22/2014. 3) palliative radiotherapy to the subcarinal lymph node under the care of Dr. Sondra Come. 4) Nivolumab 240 mg IV every 2 weeks. First dose 12/23/2016. Status post 6 cycles.  CURRENT THERAPY:  Nivolumab 480 mg IV every 4 weeks. First dose 03/17/2017. Status post 29  cycles.  INTERVAL HISTORY: Nancy Hodges 73 y.o. female returns to the clinic today for follow-up visit.  The patient has no complaints today except for occasional fatigue.  She denied having any recent chest pain, shortness of breath, cough or hemoptysis.  She has no nausea, vomiting, diarrhea or constipation.  She has no headache or visual changes.  She denied having any recent weight loss or night sweats.  She continues to tolerate her treatment with nivolumab fairly well.  She is here today for evaluation with repeat CT scan of the chest, abdomen and pelvis for restaging of her disease.  MEDICAL HISTORY: Past Medical History:  Diagnosis Date  . Allergic rhinitis   . Anxiety   . Arthritis   . Asthma   . Atrial flutter ([facility])   . Cholelithiases 09/26/2015  . Diverticulosis   . Encounter for antineoplastic immunotherapy 12/09/2016  . GERD (gastroesophageal reflux disease)   . H/O hiatal hernia    . History of blood transfusion   . History of chemotherapy   . History of kidney stones   . History of radiation therapy   . Internal hemorrhoid   . Lung cancer ([facility] [facility])    a. Stage IB non-small cell carcinoma, s/p R VATS, wedge resection, RU lobectomy 10/2012.  . Paroxysmal atrial fibrillation ([facility])    a. anticoagulated with Xarelto  . Pneumonia 06/2016   [facility]  . Presence of permanent cardiac pacemaker   . Pulmonary nodule   . Tachycardia-bradycardia syndrome ([place])    a. 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    ALLERGIES:  is allergic to chlorpheniramine-dm; prednisone; septra [sulfamethoxazole-trimethoprim]; vioxx [rofecoxib]; and penicillins.  MEDICATIONS:  Current Outpatient Medications  Medication Sig Dispense Refill  . acetaminophen (TYLENOL) 500 MG tablet Take 500 mg by mouth 3 (three) times daily as needed for moderate pain or fever.     [name] albuterol (PROVENTIL HFA;VENTOLIN HFA) 108 (90 BASE) MCG/ACT inhaler Inhale 2 puffs into the lungs every 6 (six) hours as needed for wheezing or shortness of breath.     [name] azelastine (ASTELIN) 0.1 % nasal spray Place 1 spray into both nostrils 2 (two) times daily. Use in each nostril as directed    . calcium carbonate (TUMS - DOSED IN MG ELEMENTAL CALCIUM) 500 MG chewable tablet Chew 1 tablet by mouth 2 (two) times daily as needed for indigestion or heartburn.     . diltiazem (CARTIA XT) 240 MG 24 hr capsule Take 1 capsule (240 mg total) by  mouth daily. Please make yearly appt with Dr. [name] for October for future refills. 1st attempt 90 capsule 0  . dimenhyDRINATE (DRAMAMINE) 50 MG tablet Take 12.5 mg by mouth every 8 (eight) hours as needed for dizziness.    [name] ipratropium-albuterol (DUONEB) 0.5-2.5 (3) MG/3ML SOLN Take 3 mLs by nebulization every 6 (six) hours as needed (shortness of breath or wheezing).     [name] lidocaine-prilocaine (EMLA) cream Apply 1 application topically as needed. (Patient taking differently: Apply 1  application topically as needed (for port). ) 30 g 4  . loratadine (CLARITIN) 10 MG tablet Take 10 mg by mouth daily.    [name] OVER THE COUNTER MEDICATION Take 5 mLs by mouth daily as needed (cough). H[employer]'s kids cough syrup    . Polyvinyl Alcohol-Povidone (REFRESH OP) Place 1 drop into both eyes daily as needed (dry eyes).    [name] tablet TAKE 1 TABLET(20 MG) BY MOUTH DAILY WITH SUPPER 30 tablet 5   No current facility-administered medications for this visit.     SURGICAL HISTORY:  Past Surgical History:  Procedure Laterality Date  . CARDIOVERSION N/A 02/19/2018   Procedure: CARDIOVERSION;  Surgeon: Sanda Klein, MD;  Location: [facility] END[facility]COPY;  Service: Cardiovascular;  Laterality: N/A;  . CARDIOVERSION N/A 04/30/2018   Procedure: CARDIOVERSION;  Surgeon: Thayer Headings, MD;  Location: [facility] ENDOSCOPY;  Service: Cardiovascular;  Laterality: N/A;  . CATARACT EXTRACTION W/PHACO Right 08/14/2016   Procedure: CATARACT EXTRACTION PHACO AND INTRAOCULAR LENS PLACEMENT RIGHT EYE CDE 10.53;  Surgeon: Tonny Branch, MD;  Location: [facility];  Service: Ophthalmology;  Laterality: Right;  right  . CATARACT EXTRACTION W/PHACO Left 09/25/2016   Procedure: CATARACT EXTRACTION PHACO AND INTRAOCULAR LENS PLACEMENT (IOC);  Surgeon: Tonny Branch, MD;  Location: [facility];  Service: Ophthalmology;  Laterality: Left;  left CDE 8.81  . COLONOSCOPY     [facility]: cattered sigmoid diverticula, internal hemorrhoids, sessile polyp in ascending and mid transverse colon (tubular adenoma).   . colonoscopy with polypectomy  2015  . ESOPHAGOGASTRODUODENOSCOPY     [facility]: bile reflux in stomach, mild gastritis, hiatal hernia  . ESOPHAGOGASTRODUODENOSCOPY N/A 01/31/2016   Procedure: ESOPHAGOGASTRODUODENOSCOPY (EGD);  Surgeon: [name], MD;  Location: [facility];  Service: Endoscopy;  Laterality: N/A;  1200  . IR FLUORO GUIDE PORT INSERTION RIGHT  06/30/2017  . IR US GUIDE VASC ACCESS RIGHT  06/30/2017  .  LOBECTOMY Right 10/27/2012   Procedure: LOBECTOMY;  Surgeon: [name], MD;  Location: [facility];  Service: Thoracic;  Laterality: Right;   RIGHT UPPER LOBECTOMY & Node Dissection  . LYMPH NODE BIOPSY Right 05/01/2014   Procedure: LYMPH NODE BIOPSY, Right Axillary;  Surgeon: [name], MD;  Location: [facility];  Service: Thoracic;  Laterality: Right;  . PACEMAKER IMPLANT N/A 10/30/2017   SJM Assurity MRI PPM implanted by Dr [name] once leadless pacemaker battery depleted  . PARTIAL HYSTERECTOMY    . PERMANENT PACEMAKER INSERTION N/A 09/14/2012   Nanostim (SJM) leadless pacemaker (LEADLESS II STUDY PATIENT) implanted by Dr [name], no longer functioning, device abandoned.  [name] VIDEO ASSISTED THORACOSCOPY (VATS)/WEDGE RESECTION Right 10/27/2012   Procedure: VIDEO ASSISTED THORACOSCOPY (VATS),RGHT UPPER LOBE LUNG WEDGE RESECTION;  Surgeon: [name], MD;  Location: [facility];  Service: Thoracic;  Laterality: Right;    REVIEW OF SYSTEMS:  Constitutional: positive for fatigue Eyes: negative Ears, nose, mouth, throat, and face: negative Respiratory: negative Cardiovascular: negative Gastrointestinal: negative Genitourinary:negative Integument/breast: negative Hematologic/lymphatic: negative Musculoskeletal:negative Neurological:  negative Behavioral/Psych: negative Endocrine: negative Allergic/Immunologic: negative   PHYSICAL EXAMINATION: General appearance: alert, cooperative and no distress Head: Normocephalic, without obvious abnormality, atraumatic Neck: no adenopathy, no JVD, supple, symmetrical, trachea midline and thyroid not enlarged, symmetric, no tenderness/mass/nodules Lymph nodes: Cervical, supraclavicular, and axillary nodes normal. Resp: clear to auscultation bilaterally Back: symmetric, no curvature. ROM normal. No CVA tenderness. Cardio: regular rate and rhythm, S1, S2 normal, no murmur, click, rub or gallop GI: soft, non-tender; bowel sounds normal; no  masses,  no organomegaly Extremities: extremities normal, atraumatic, no cyanosis or edema Neurologic: Alert and oriented X 3, normal strength and tone. Normal symmetric reflexes. Normal coordination and gait  ECOG PERFORMANCE STATUS: 1 - Symptomatic but completely ambulatory  Blood pressure 131/77, pulse 60, temperature 98.5 F (36.9 C), temperature source Temporal, resp. rate 17, height 5\' 2"  (1.575 m), weight 153 lb 1.6 oz (69.4 kg), SpO2 97 %.  LABORATORY DATA: Lab Results  Component Value Date   WBC 8.0 06/07/2019   HGB 14.5 06/07/2019   HCT 41.4 06/07/2019   MCV 95.4 06/07/2019   PLT 222 06/07/2019      Chemistry      Component Value Date/Time   NA 138 06/07/2019 0958   NA 139 08/05/2017 0902   K 4.4 06/07/2019 0958   K 4.1 08/05/2017 0902   CL 103 06/07/2019 0958   CO2 25 06/07/2019 0958   CO2 28 08/05/2017 0902   BUN 12 06/07/2019 0958   BUN 10.1 08/05/2017 0902   CREATININE 0.79 06/07/2019 0958   CREATININE 0.8 08/05/2017 0902   GLU 134 (H) 02/18/2017 1410      Component Value Date/Time   CALCIUM 10.3 06/07/2019 0958   CALCIUM 9.5 08/05/2017 0902   ALKPHOS 126 06/07/2019 0958   ALKPHOS 111 08/05/2017 0902   AST 52 (H) 06/07/2019 0958   AST 33 08/05/2017 0902   ALT 49 (H) 06/07/2019 0958   ALT 34 08/05/2017 0902   BILITOT 1.3 (H) 06/07/2019 0958   BILITOT 1.39 (H) 08/05/2017 0902       RADIOGRAPHIC STUDIES: Ct Chest W Contrast  Result Date: 06/03/2019 CLINICAL DATA:  Non-small cell lung cancer staging. Status post right upper lobe lobectomy EXAM: CT CHEST, ABDOMEN, AND PELVIS WITH CONTRAST TECHNIQUE: Multidetector CT imaging of the chest, abdomen and pelvis was performed following the standard protocol during bolus administration of intravenous contrast. CONTRAST:  141mL OMNIPAQUE IOHEXOL 300 MG/ML  SOLN COMPARISON:  02/14/2019 FINDINGS: CT CHEST FINDINGS Cardiovascular: The heart is normal in size. No pericardial effusion. There is mild left atrial  enlargement. The aorta is normal in caliber. Stable moderate atherosclerotic calcifications but no dissection. Stable three-vessel coronary artery calcifications. Mild pulmonary artery enlargement. The pacer wires are stable. Mediastinum/Nodes: No mediastinal or hilar mass or lymphadenopathy. The esophagus is grossly normal. Lungs/Pleura: Stable surgical changes from right upper lobe lobectomy. There are also stable probable radiation changes involving the right paramediastinal lung. Underlying emphysematous changes and pulmonary scarring. Few small scattered sub 3 mm pulmonary nodules are stable. No worrisome pulmonary lesions or new pulmonary nodules. No acute overlying pulmonary process. Musculoskeletal: No breast masses, supraclavicular or axillary adenopathy. The bony thorax is intact. No worrisome bone lesions. CT ABDOMEN PELVIS FINDINGS Hepatobiliary: Stable appearing cirrhotic changes involving the liver with a very irregular liver contour, atrophy of the right hepatic lobe, increased caudate to right lobe ratio, dilated hepatic fissures and portal venous hypertension with portal venous collaterals. Esophageal varices are noted. No focal hepatic lesions are identified. Peripheral  area of enhancement is likely a small vascular shunt. The gallbladder contains numerous gallstones but no findings for acute cholecystitis. No common bile duct dilatation. Pancreas: No mass, inflammation or ductal dilatation. Spleen: Stable mild splenomegaly and a few small splenic lesions. Adrenals/Urinary Tract: The adrenal glands are normal. No findings suspicious for adrenal gland metastasis. The kidneys are unremarkable and stable. Cortical scarring changes again noted. No collecting system abnormalities. The bladder is unremarkable. Stomach/Bowel: The stomach, duodenum, small bowel and colon are unremarkable. No acute inflammatory changes, mass lesions or obstructive findings. The terminal ileum is normal. The appendix is  normal. Stable colonic diverticulosis without findings for acute diverticulitis. Vascular/Lymphatic: Stable atherosclerotic calcifications involving the aorta and branch vessels. No aneurysm or dissection. The branch vessels are patent. Stable ostial calcifications. The major venous structures are patent. Reproductive: The uterus is surgically absent. Both ovaries are still present and appear normal. Other: No pelvic mass or adenopathy. No free pelvic fluid collections. No inguinal mass or adenopathy. No abdominal wall hernia or subcutaneous lesions. Musculoskeletal: No significant bony findings. IMPRESSION: 1. Status post right upper lobe lobectomy. No findings suspicious for recurrent tumor. No mediastinal or hilar adenopathy or distant metastatic disease. 2. Stable cirrhotic changes involving the liver with portal venous hypertension, portal venous collaterals and esophageal varices. 3. Cholelithiasis but no findings for acute cholecystitis. 4. No acute abdominal/pelvic findings, mass lesions or adenopathy. Electronically Signed   By: Marijo Sanes M.D.   On: 06/03/2019 12:12   Ct Abdomen Pelvis W Contrast  Result Date: 06/03/2019 CLINICAL DATA:  Non-small cell lung cancer staging. Status post right upper lobe lobectomy EXAM: CT CHEST, ABDOMEN, AND PELVIS WITH CONTRAST TECHNIQUE: Multidetector CT imaging of the chest, abdomen and pelvis was performed following the standard protocol during bolus administration of intravenous contrast. CONTRAST:  150mL OMNIPAQUE IOHEXOL 300 MG/ML  SOLN COMPARISON:  02/14/2019 FINDINGS: CT CHEST FINDINGS Cardiovascular: The heart is normal in size. No pericardial effusion. There is mild left atrial enlargement. The aorta is normal in caliber. Stable moderate atherosclerotic calcifications but no dissection. Stable three-vessel coronary artery calcifications. Mild pulmonary artery enlargement. The pacer wires are stable. Mediastinum/Nodes: No mediastinal or hilar mass or  lymphadenopathy. The esophagus is grossly normal. Lungs/Pleura: Stable surgical changes from right upper lobe lobectomy. There are also stable probable radiation changes involving the right paramediastinal lung. Underlying emphysematous changes and pulmonary scarring. Few small scattered sub 3 mm pulmonary nodules are stable. No worrisome pulmonary lesions or new pulmonary nodules. No acute overlying pulmonary process. Musculoskeletal: No breast masses, supraclavicular or axillary adenopathy. The bony thorax is intact. No worrisome bone lesions. CT ABDOMEN PELVIS FINDINGS Hepatobiliary: Stable appearing cirrhotic changes involving the liver with a very irregular liver contour, atrophy of the right hepatic lobe, increased caudate to right lobe ratio, dilated hepatic fissures and portal venous hypertension with portal venous collaterals. Esophageal varices are noted. No focal hepatic lesions are identified. Peripheral area of enhancement is likely a small vascular shunt. The gallbladder contains numerous gallstones but no findings for acute cholecystitis. No common bile duct dilatation. Pancreas: No mass, inflammation or ductal dilatation. Spleen: Stable mild splenomegaly and a few small splenic lesions. Adrenals/Urinary Tract: The adrenal glands are normal. No findings suspicious for adrenal gland metastasis. The kidneys are unremarkable and stable. Cortical scarring changes again noted. No collecting system abnormalities. The bladder is unremarkable. Stomach/Bowel: The stomach, duodenum, small bowel and colon are unremarkable. No acute inflammatory changes, mass lesions or obstructive findings. The terminal ileum is normal. The  appendix is normal. Stable colonic diverticulosis without findings for acute diverticulitis. Vascular/Lymphatic: Stable atherosclerotic calcifications involving the aorta and branch vessels. No aneurysm or dissection. The branch vessels are patent. Stable ostial calcifications. The major  venous structures are patent. Reproductive: The uterus is surgically absent. Both ovaries are still present and appear normal. Other: No pelvic mass or adenopathy. No free pelvic fluid collections. No inguinal mass or adenopathy. No abdominal wall hernia or subcutaneous lesions. Musculoskeletal: No significant bony findings. IMPRESSION: 1. Status post right upper lobe lobectomy. No findings suspicious for recurrent tumor. No mediastinal or hilar adenopathy or distant metastatic disease. 2. Stable cirrhotic changes involving the liver with portal venous hypertension, portal venous collaterals and esophageal varices. 3. Cholelithiasis but no findings for acute cholecystitis. 4. No acute abdominal/pelvic findings, mass lesions or adenopathy. Electronically Signed   By: Marijo Sanes M.D.   On: 06/03/2019 12:12   ASSESSMENT AND PLAN:  This is a very pleasant 73 years old white female with metastatic non-small cell lung cancer, adenocarcinoma with no actionable mutations status post systemic chemotherapy with carboplatin and Alimta and she is currently undergoing treatment with second line immunotherapy with Nivolumab status post 6 cycles. This was followed by immunotherapy with Nivolumab 480 mg IV every 4 weeks status post 29 cycles. The patient has been tolerating this treatment well with no concerning adverse effects. She had repeat CT scan of the chest, abdomen and pelvis performed recently.  I personally and independently reviewed the scans and discussed the results with the patient today. Her scan showed no concerning findings for disease progression. I recommended for the patient to continue her current treatment with nivolumab every 4 weeks and she will proceed with cycle #30 today. For the cardiac arrhythmia she is followed by cardiology. The patient was advised to call immediately if she has any concerning symptoms in the interval. The patient voices understanding of current disease status and  treatment options and is in agreement with the current care plan. All questions were answered. The patient knows to call the clinic with any problems, questions or concerns. We can certainly see the patient much sooner if necessary.  Disclaimer: This note was dictated with voice recognition software. Similar sounding words can inadvertently be transcribed and may not be corrected upon review.

## 2019-06-28 ENCOUNTER — Other Ambulatory Visit: Payer: Self-pay | Admitting: Internal Medicine

## 2019-06-28 MED ORDER — DILTIAZEM HCL ER COATED BEADS 240 MG PO CP24: 240.0000 mg | ORAL_CAPSULE | Freq: Every day | ORAL | 2 refills | Status: DC

## 2019-07-04 ENCOUNTER — Other Ambulatory Visit: Payer: Self-pay | Admitting: Medical Oncology

## 2019-07-04 DIAGNOSIS — C3490 Malignant neoplasm of unspecified part of unspecified bronchus or lung: Secondary | ICD-10-CM

## 2019-07-04 DIAGNOSIS — E032 Hypothyroidism due to medicaments and other exogenous substances: Secondary | ICD-10-CM

## 2019-07-04 DIAGNOSIS — Z5112 Encounter for antineoplastic immunotherapy: Secondary | ICD-10-CM

## 2019-07-05 ENCOUNTER — Other Ambulatory Visit: Payer: Self-pay

## 2019-07-05 ENCOUNTER — Encounter: Payer: Self-pay | Admitting: Internal Medicine

## 2019-07-05 ENCOUNTER — Inpatient Hospital Stay (HOSPITAL_BASED_OUTPATIENT_CLINIC_OR_DEPARTMENT_OTHER): Payer: Medicare Other | Admitting: Internal Medicine

## 2019-07-05 ENCOUNTER — Telehealth: Payer: Self-pay | Admitting: Internal Medicine

## 2019-07-05 ENCOUNTER — Inpatient Hospital Stay: Payer: Medicare Other

## 2019-07-05 ENCOUNTER — Inpatient Hospital Stay: Payer: Medicare Other | Attending: Internal Medicine

## 2019-07-05 VITALS — BP 142/60 | HR 79 | Temp 98.7°F | Resp 20 | Ht 62.0 in | Wt 153.2 lb

## 2019-07-05 DIAGNOSIS — C3411 Malignant neoplasm of upper lobe, right bronchus or lung: Secondary | ICD-10-CM | POA: Diagnosis present

## 2019-07-05 DIAGNOSIS — C773 Secondary and unspecified malignant neoplasm of axilla and upper limb lymph nodes: Secondary | ICD-10-CM | POA: Diagnosis present

## 2019-07-05 DIAGNOSIS — Z95828 Presence of other vascular implants and grafts: Secondary | ICD-10-CM

## 2019-07-05 DIAGNOSIS — C3491 Malignant neoplasm of unspecified part of right bronchus or lung: Secondary | ICD-10-CM

## 2019-07-05 DIAGNOSIS — E032 Hypothyroidism due to medicaments and other exogenous substances: Secondary | ICD-10-CM

## 2019-07-05 DIAGNOSIS — Z5112 Encounter for antineoplastic immunotherapy: Secondary | ICD-10-CM | POA: Insufficient documentation

## 2019-07-05 DIAGNOSIS — C3490 Malignant neoplasm of unspecified part of unspecified bronchus or lung: Secondary | ICD-10-CM

## 2019-07-05 DIAGNOSIS — Z79899 Other long term (current) drug therapy: Secondary | ICD-10-CM | POA: Diagnosis not present

## 2019-07-05 LAB — CMP (CANCER CENTER ONLY)
ALT: 37 U/L (ref 0–44)
AST: 36 U/L (ref 15–41)
Albumin: 4 g/dL (ref 3.5–5.0)
Alkaline Phosphatase: 98 U/L (ref 38–126)
Anion gap: 8 (ref 5–15)
BUN: 12 mg/dL (ref 8–23)
CO2: 27 mmol/L (ref 22–32)
Calcium: 9.6 mg/dL (ref 8.9–10.3)
Chloride: 105 mmol/L (ref 98–111)
Creatinine: 0.74 mg/dL (ref 0.44–1.00)
GFR, Est AFR Am: >60 mL/min (ref >60)
GFR, Estimated: >60 mL/min (ref >60)
Glucose, Bld: 115 mg/dL — ABNORMAL HIGH (ref 70–99)
Potassium: 4.3 mmol/L (ref 3.5–5.1)
Sodium: 140 mmol/L (ref 135–145)
Total Bilirubin: 1.2 mg/dL (ref 0.3–1.2)
Total Protein: 7 g/dL (ref 6.5–8.1)

## 2019-07-05 LAB — CBC WITH DIFFERENTIAL (CANCER CENTER ONLY)
Abs Immature Granulocytes: 0.02 10*3/uL (ref 0.00–0.07)
Basophils Absolute: 0.1 10*3/uL (ref 0.0–0.1)
Basophils Relative: 1 %
Eosinophils Absolute: 0.1 10*3/uL (ref 0.0–0.5)
Eosinophils Relative: 1 %
HCT: 40.7 % (ref 36.0–46.0)
Hemoglobin: 14.2 g/dL (ref 12.0–15.0)
Immature Granulocytes: 0 %
Lymphocytes Relative: 20 %
Lymphs Abs: 1.6 10*3/uL (ref 0.7–4.0)
MCH: 32.6 pg (ref 26.0–34.0)
MCHC: 34.9 g/dL (ref 30.0–36.0)
MCV: 93.6 fL (ref 80.0–100.0)
Monocytes Absolute: 0.6 10*3/uL (ref 0.1–1.0)
Monocytes Relative: 8 %
Neutro Abs: 5.5 10*3/uL (ref 1.7–7.7)
Neutrophils Relative %: 70 %
Platelet Count: 198 10*3/uL (ref 150–400)
RBC: 4.35 MIL/uL (ref 3.87–5.11)
RDW: 12.7 % (ref 11.5–15.5)
WBC Count: 7.8 10*3/uL (ref 4.0–10.5)
nRBC: 0 % (ref 0.0–0.2)

## 2019-07-05 LAB — TSH: TSH: 3.276 u[IU]/mL (ref 0.308–3.960)

## 2019-07-05 MED ORDER — HEPARIN SOD (PORK) LOCK FLUSH 100 UNIT/ML IV SOLN
500.0000 [IU] | Freq: Once | INTRAVENOUS | Status: AC | PRN
  Administered 2019-07-05: 14:00:00 500 [IU]
  Filled 2019-07-05: qty 5

## 2019-07-05 MED ORDER — SODIUM CHLORIDE 0.9% FLUSH
10.0000 mL | INTRAVENOUS | Status: DC | PRN
  Administered 2019-07-05: 10 mL via INTRAVENOUS
  Filled 2019-07-05: qty 10

## 2019-07-05 MED ORDER — SODIUM CHLORIDE 0.9 % IV SOLN
480.0000 mg | Freq: Once | INTRAVENOUS | Status: AC
  Administered 2019-07-05: 480 mg via INTRAVENOUS
  Filled 2019-07-05: qty 48

## 2019-07-05 MED ORDER — SODIUM CHLORIDE 0.9% FLUSH
10.0000 mL | INTRAVENOUS | Status: DC | PRN
  Administered 2019-07-05: 14:00:00 10 mL
  Filled 2019-07-05: qty 10

## 2019-07-05 MED ORDER — SODIUM CHLORIDE 0.9 % IV SOLN
Freq: Once | INTRAVENOUS | Status: AC
  Administered 2019-07-05: 13:00:00 via INTRAVENOUS
  Filled 2019-07-05: qty 250

## 2019-07-05 NOTE — Progress Notes (Signed)
[place] Telephone:(336) [phone]   [phone]  OFFICE PROGRESS NOTE  Harris, [name], [name] [address] [place] [address]  DIAGNOSIS: Metastatic non-small cell lung cancer, adenocarcinoma. This was initially diagnosed as stage IB (T2a, N0, M0) in March of 2014, status post right upper lobectomy with lymph node dissection but the patient has evidence for disease recurrence in the right axilla status post resection.  PRIOR THERAPY:  1) Status post right axillary lymph node biopsy on 05/01/2014 under the care of Dr. Roxan Hockey. 2) Systemic chemotherapy with carboplatin for AUC of 5 and Alimta 500 mg/M2 every 3 weeks. First cycle 05/31/2014. Status post 6 cycles. Last dose was given 09/22/2014. 3) palliative radiotherapy to the subcarinal lymph node under the care of Dr. Sondra Come. 4) Nivolumab 240 mg IV every 2 weeks. First dose 12/23/2016. Status post 6 cycles.  CURRENT THERAPY:  Nivolumab 480 mg IV every 4 weeks. First dose 03/17/2017. Status post 30  cycles.  INTERVAL HISTORY: Nancy Hodges 73 y.o. female returns to the clinic today for follow-up visit.  The patient is feeling fine today with no concerning complaints.  She was complaining of increasing fatigue and weakness secondary to her cardiac medication which was a generic form but she felt much better when she switched back to the brand name.  She denied having any current chest pain, shortness of breath, cough or hemoptysis.  She denied having any fever or chills.  She has no nausea, vomiting, diarrhea or constipation.  She has no headache or visual changes.  She is here today for evaluation before starting cycle #31 of her treatment.  MEDICAL HISTORY: Past Medical History:  Diagnosis Date  . Allergic rhinitis   . Anxiety   . Arthritis   . Asthma   . Atrial flutter ([place])   . Cholelithiases 09/26/2015  . Diverticulosis   . Encounter for antineoplastic immunotherapy 12/09/2016  . GERD  (gastroesophageal reflux disease)   . H/O hiatal hernia   . History of blood transfusion   . History of chemotherapy   . History of kidney stones   . History of radiation therapy   . Internal hemorrhoid   . Lung cancer ([place])    a. Stage IB non-small cell carcinoma, s/p R VATS, wedge resection, RU lobectomy 10/2012.  . Paroxysmal atrial fibrillation ([facility])    a. anticoagulated with Xarelto  . Pneumonia 06/2016   [facility]  . Presence of permanent cardiac pacemaker   . Pulmonary nodule   . Tachycardia-bradycardia syndrome ([place])    a. 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    ALLERGIES:  is allergic to chlorpheniramine-dm; prednisone; septra [sulfamethoxazole-trimethoprim]; vioxx [rofecoxib]; and penicillins.  MEDICATIONS:  Current Outpatient Medications  Medication Sig Dispense Refill  . acetaminophen (TYLENOL) 500 MG tablet Take 500 mg by mouth 3 (three) times daily as needed for moderate pain or fever.     [name] albuterol (PROVENTIL HFA;VENTOLIN HFA) 108 (90 BASE) MCG/ACT inhaler Inhale 2 puffs into the lungs every 6 (six) hours as needed for wheezing or shortness of breath.     [name] azelastine (ASTELIN) 0.1 % nasal spray Place 1 spray into both nostrils 2 (two) times daily. Use in each nostril as directed    . calcium carbonate (TUMS - DOSED IN MG ELEMENTAL CALCIUM) 500 MG chewable tablet Chew 1 tablet by mouth 2 (two) times daily as needed for indigestion or heartburn.     . diltiazem (CARTIA XT) 240 MG  24 hr capsule Take 1 capsule (240 mg total) by mouth daily. 90 capsule 2  . dimenhyDRINATE (DRAMAMINE) 50 MG tablet Take 12.5 mg by mouth every 8 (eight) hours as needed for dizziness.    [name] ipratropium-albuterol (DUONEB) 0.5-2.5 (3) MG/3ML SOLN Take 3 mLs by nebulization every 6 (six) hours as needed (shortness of breath or wheezing).     [name] lidocaine-prilocaine (EMLA) cream Apply 1 application topically as needed. (Patient taking differently: Apply 1 application topically as needed  (for port). ) 30 g 4  . loratadine (CLARITIN) 10 MG tablet Take 10 mg by mouth daily.    [name] OVER THE COUNTER MEDICATION Take 5 mLs by mouth daily as needed (cough). Hyland's kids cough syrup    . Polyvinyl Alcohol-Povidone (REFRESH OP) Place 1 drop into both eyes daily as needed (dry eyes).    [name] tablet TAKE 1 TABLET(20 MG) BY MOUTH DAILY WITH SUPPER 30 tablet 5   No current facility-administered medications for this visit.    Facility-Administered Medications Ordered in Other Visits  Medication Dose Route Frequency Provider Last Rate Last Dose  . sodium chloride flush (NS) 0.9 % injection 10 mL  10 mL Intravenous PRN Curt Bears, MD   10 mL at 07/05/19 1136    SURGICAL HISTORY:  Past Surgical History:  Procedure Laterality Date  . CARDIOVERSION N/A 02/19/2018   Procedure: CARDIOVERSION;  Surgeon: Sanda Klein, MD;  Location: [facility] ENDOSCOPY;  Service: Cardiovascular;  Laterality: N/A;  . CARDIOVERSION N/A 04/30/2018   Procedure: CARDIOVERSION;  Surgeon: Thayer Headings, MD;  Location[facility];  Service: Cardiovascular;  Laterality: N/A;  . CATARACT EXTRACTION W/PHACO Right 08/14/2016   Procedure: CATARACT EXTRACTION PHACO AND INTRAOCULAR LENS PLACEMENT RIGHT EYE CDE 10.53;  Surgeon: Tonny Branch, MD;  [facility];  Service: Ophthalmology;  Laterality: Right;  right  . CATARACT EXTRACTION W/PHACO Left 09/25/2016   Procedure: CATARACT EXTRACTION PHACO AND INTRAOCULAR LENS PLACEMENT (IOC);  Surgeon: Tonny Branch, MD;  Location: [facility];  Service: Ophthalmology;  Laterality: Left;  left CDE 8.81  . COLONOSCOPY     [facility]: cattered sigmoid diverticula, internal hemorrhoids, sessile polyp in ascending and mid transverse colon (tubular adenoma).   . colonoscopy with polypectomy  2015  . ESOPHAGOGASTRODUODENOSCOPY     [facility]: bile reflux in stomach, mild gastritis, hiatal hernia  . ESOPHAGOGASTRODUODENOSCOPY N/A 01/31/2016   Procedure: ESOPHAGOGASTRODUODENOSCOPY  (EGD);  Surgeon: [name], MD;  [facility];  Service: Endoscopy;  Laterality: N/A;  1200  . IR FLUORO GUIDE PORT INSERTION RIGHT  06/30/2017  . IR US GUIDE VASC ACCESS RIGHT  06/30/2017  . LOBECTOMY Right 10/27/2012   Procedure: LOBECTOMY;  Surgeon: [name], MD;  Location: [facility];  Service: Thoracic;  Laterality: Right;   RIGHT UPPER LOBECTOMY & Node Dissection  . LYMPH NODE BIOPSY Right 05/01/2014   Procedure: LYMPH NODE BIOPSY, Right Axillary;  Surgeon: [name], MD;  Location: [facility];  Service: Thoracic;  Laterality: Right;  . PACEMAKER IMPLANT N/A 10/30/2017   SJM Assurity MRI PPM implanted by Dr [name] once leadless pacemaker battery depleted  . PARTIAL HYSTERECTOMY    . PERMANENT PACEMAKER INSERTION N/A 09/14/2012   Nanostim (SJM) leadless pacemaker (LEADLESS II STUDY PATIENT) implanted by Dr [name], no longer functioning, device abandoned.  [name] VIDEO ASSISTED THORACOSCOPY (VATS)/WEDGE RESECTION Right 10/27/2012   Procedure: VIDEO ASSISTED THORACOSCOPY (VATS),RGHT UPPER LOBE LUNG WEDGE RESECTION;  Surgeon: [name], MD;  [facility]  OR;  Service: Thoracic;  Laterality: Right;    REVIEW OF SYSTEMS:  A comprehensive review of systems was negative.   PHYSICAL EXAMINATION: General appearance: alert, cooperative and no distress Head: Normocephalic, without obvious abnormality, atraumatic Neck: no adenopathy, no JVD, supple, symmetrical, trachea midline and thyroid not enlarged, symmetric, no tenderness/mass/nodules Lymph nodes: Cervical, supraclavicular, and axillary nodes normal. Resp: clear to auscultation bilaterally Back: symmetric, no curvature. ROM normal. No CVA tenderness. Cardio: regular rate and rhythm, S1, S2 normal, no murmur, click, rub or gallop GI: soft, non-tender; bowel sounds normal; no masses,  no organomegaly Extremities: extremities normal, atraumatic, no cyanosis or edema  ECOG PERFORMANCE STATUS: 1 - Symptomatic but  completely ambulatory  Blood pressure (!) 142/60, pulse 79, temperature 98.7 F (37.1 C), temperature source Oral, resp. rate 20, height 5\' 2"  (1.575 m), weight 153 lb 3.2 oz (69.5 kg), SpO2 97 %.  LABORATORY DATA: Lab Results  Component Value Date   WBC 7.8 07/05/2019   HGB 14.2 07/05/2019   HCT 40.7 07/05/2019   MCV 93.6 07/05/2019   PLT 198 07/05/2019      Chemistry      Component Value Date/Time   NA 138 06/07/2019 0958   NA 139 08/05/2017 0902   K 4.4 06/07/2019 0958   K 4.1 08/05/2017 0902   CL 103 06/07/2019 0958   CO2 25 06/07/2019 0958   CO2 28 08/05/2017 0902   BUN 12 06/07/2019 0958   BUN 10.1 08/05/2017 0902   CREATININE 0.79 06/07/2019 0958   CREATININE 0.8 08/05/2017 0902   GLU 134 (H) 02/18/2017 1410      Component Value Date/Time   CALCIUM 10.3 06/07/2019 0958   CALCIUM 9.5 08/05/2017 0902   ALKPHOS 126 06/07/2019 0958   ALKPHOS 111 08/05/2017 0902   AST 52 (H) 06/07/2019 0958   AST 33 08/05/2017 0902   ALT 49 (H) 06/07/2019 0958   ALT 34 08/05/2017 0902   BILITOT 1.3 (H) 06/07/2019 0958   BILITOT 1.39 (H) 08/05/2017 0902       RADIOGRAPHIC STUDIES: No results found. ASSESSMENT AND PLAN:  This is a very pleasant 73 years old white female with metastatic non-small cell lung cancer, adenocarcinoma with no actionable mutations status post systemic chemotherapy with carboplatin and Alimta and she is currently undergoing treatment with second line immunotherapy with Nivolumab status post 6 cycles. This was followed by immunotherapy with Nivolumab 480 mg IV every 4 weeks status post 30 cycles. The patient continues to tolerate her treatment well with no concerning adverse effects. I recommended for her to proceed with cycle #31 today as planned. I will see her back for follow-up visit in 4 weeks for evaluation before the next cycle of her treatment. She was advised to call immediately if she has any concerning symptoms in the interval. The patient  voices understanding of current disease status and treatment options and is in agreement with the current care plan. All questions were answered. The patient knows to call the clinic with any problems, questions or concerns. We can certainly see the patient much sooner if necessary.  Disclaimer: This note was dictated with voice recognition software. Similar sounding words can inadvertently be transcribed and may not be corrected upon review.

## 2019-07-05 NOTE — Patient Instructions (Signed)
[facility] Discharge Instructions for Patients Receiving Chemotherapy  Today you received the following chemotherapy agents Opdivo  To help prevent nausea and vomiting after your treatment, we encourage you to take your nausea medication as directed   If you develop nausea and vomiting that is not controlled by your nausea medication, call the clinic.   BELOW ARE SYMPTOMS THAT SHOULD BE REPORTED IMMEDIATELY:  *FEVER GREATER THAN 100.5 F  *CHILLS WITH OR WITHOUT FEVER  NAUSEA AND VOMITING THAT IS NOT CONTROLLED WITH YOUR NAUSEA MEDICATION  *UNUSUAL SHORTNESS OF BREATH  *UNUSUAL BRUISING OR BLEEDING  TENDERNESS IN MOUTH AND THROAT WITH OR WITHOUT PRESENCE OF ULCERS  *URINARY PROBLEMS  *BOWEL PROBLEMS  UNUSUAL RASH Items with * indicate a potential emergency and should be followed up as soon as possible.  Feel free to call the clinic should you have any questions or concerns. The clinic phone number is [phone].  Please show the CHEMO ALERT CARD at check-in to the Emergency Department and triage nurse.   

## 2019-07-05 NOTE — Telephone Encounter (Signed)
Gave avs and calendar ° °

## 2019-07-06 ENCOUNTER — Telehealth: Payer: Self-pay | Admitting: Internal Medicine

## 2019-07-06 NOTE — Telephone Encounter (Signed)
Returned patient's phone call regarding rescheduling January and February appointment times, per patient's request appointment time has been rescheduled.

## 2019-08-01 ENCOUNTER — Other Ambulatory Visit: Payer: Self-pay

## 2019-08-01 DIAGNOSIS — C3491 Malignant neoplasm of unspecified part of right bronchus or lung: Secondary | ICD-10-CM

## 2019-08-01 DIAGNOSIS — E032 Hypothyroidism due to medicaments and other exogenous substances: Secondary | ICD-10-CM

## 2019-08-01 LAB — CUP PACEART REMOTE DEVICE CHECK
Battery Remaining Longevity: 120 mo
Battery Remaining Percentage: 95.5 %
Battery Voltage: 3.01 V
Brady Statistic AP VP Percent: 1 %
Brady Statistic AP VS Percent: 1.1 %
Brady Statistic AS VP Percent: 1 %
Brady Statistic AS VS Percent: 99 %
Brady Statistic RA Percent Paced: 1 %
Brady Statistic RV Percent Paced: 18 %
Date Time Interrogation Session: 20201228020013
Implantable Lead Implant Date: 20190329
Implantable Lead Implant Date: 20190329
Implantable Lead Location: 753859
Implantable Lead Location: 753860
Implantable Pulse Generator Implant Date: 20190329
Lead Channel Impedance Value: 400 Ohm
Lead Channel Impedance Value: 490 Ohm
Lead Channel Pacing Threshold Amplitude: 1 V
Lead Channel Pacing Threshold Amplitude: 1 V
Lead Channel Pacing Threshold Pulse Width: 0.5 ms
Lead Channel Pacing Threshold Pulse Width: 0.5 ms
Lead Channel Sensing Intrinsic Amplitude: 12 mV
Lead Channel Sensing Intrinsic Amplitude: 3.7 mV
Lead Channel Setting Pacing Amplitude: 2 V
Lead Channel Setting Pacing Amplitude: 2.5 V
Lead Channel Setting Pacing Pulse Width: 0.5 ms
Lead Channel Setting Sensing Sensitivity: 2 mV
Pulse Gen Model: 2272
Pulse Gen Serial Number: 9003317

## 2019-08-02 ENCOUNTER — Encounter: Payer: Self-pay | Admitting: Internal Medicine

## 2019-08-02 ENCOUNTER — Inpatient Hospital Stay: Payer: Medicare Other

## 2019-08-02 ENCOUNTER — Ambulatory Visit (INDEPENDENT_AMBULATORY_CARE_PROVIDER_SITE_OTHER): Payer: Medicare Other | Admitting: *Deleted

## 2019-08-02 ENCOUNTER — Inpatient Hospital Stay (HOSPITAL_BASED_OUTPATIENT_CLINIC_OR_DEPARTMENT_OTHER): Payer: Medicare Other | Admitting: Internal Medicine

## 2019-08-02 ENCOUNTER — Other Ambulatory Visit: Payer: Self-pay

## 2019-08-02 ENCOUNTER — Encounter: Payer: Self-pay | Admitting: *Deleted

## 2019-08-02 VITALS — BP 135/70 | HR 96 | Temp 98.5°F | Resp 18 | Ht 62.0 in | Wt 153.5 lb

## 2019-08-02 DIAGNOSIS — C3490 Malignant neoplasm of unspecified part of unspecified bronchus or lung: Secondary | ICD-10-CM

## 2019-08-02 DIAGNOSIS — Z5112 Encounter for antineoplastic immunotherapy: Secondary | ICD-10-CM

## 2019-08-02 DIAGNOSIS — I480 Paroxysmal atrial fibrillation: Secondary | ICD-10-CM

## 2019-08-02 DIAGNOSIS — C3491 Malignant neoplasm of unspecified part of right bronchus or lung: Secondary | ICD-10-CM

## 2019-08-02 DIAGNOSIS — E032 Hypothyroidism due to medicaments and other exogenous substances: Secondary | ICD-10-CM

## 2019-08-02 DIAGNOSIS — Z95828 Presence of other vascular implants and grafts: Secondary | ICD-10-CM

## 2019-08-02 LAB — CBC WITH DIFFERENTIAL (CANCER CENTER ONLY)
Abs Immature Granulocytes: 0.01 10*3/uL (ref 0.00–0.07)
Basophils Absolute: 0.1 10*3/uL (ref 0.0–0.1)
Basophils Relative: 1 %
Eosinophils Absolute: 0 10*3/uL (ref 0.0–0.5)
Eosinophils Relative: 1 %
HCT: 41.1 % (ref 36.0–46.0)
Hemoglobin: 14.5 g/dL (ref 12.0–15.0)
Immature Granulocytes: 0 %
Lymphocytes Relative: 15 %
Lymphs Abs: 0.9 10*3/uL (ref 0.7–4.0)
MCH: 33.6 pg (ref 26.0–34.0)
MCHC: 35.3 g/dL (ref 30.0–36.0)
MCV: 95.4 fL (ref 80.0–100.0)
Monocytes Absolute: 0.4 10*3/uL (ref 0.1–1.0)
Monocytes Relative: 6 %
Neutro Abs: 4.5 10*3/uL (ref 1.7–7.7)
Neutrophils Relative %: 77 %
Platelet Count: 213 10*3/uL (ref 150–400)
RBC: 4.31 MIL/uL (ref 3.87–5.11)
RDW: 12.7 % (ref 11.5–15.5)
WBC Count: 5.8 10*3/uL (ref 4.0–10.5)
nRBC: 0 % (ref 0.0–0.2)

## 2019-08-02 LAB — CMP (CANCER CENTER ONLY)
ALT: 36 U/L (ref 0–44)
AST: 37 U/L (ref 15–41)
Albumin: 4 g/dL (ref 3.5–5.0)
Alkaline Phosphatase: 111 U/L (ref 38–126)
Anion gap: 8 (ref 5–15)
BUN: 12 mg/dL (ref 8–23)
CO2: 27 mmol/L (ref 22–32)
Calcium: 9.4 mg/dL (ref 8.9–10.3)
Chloride: 103 mmol/L (ref 98–111)
Creatinine: 0.8 mg/dL (ref 0.44–1.00)
GFR, Est AFR Am: >60 mL/min (ref >60)
GFR, Estimated: >60 mL/min (ref >60)
Glucose, Bld: 192 mg/dL — ABNORMAL HIGH (ref 70–99)
Potassium: 4.5 mmol/L (ref 3.5–5.1)
Sodium: 138 mmol/L (ref 135–145)
Total Bilirubin: 1.3 mg/dL — ABNORMAL HIGH (ref 0.3–1.2)
Total Protein: 7 g/dL (ref 6.5–8.1)

## 2019-08-02 LAB — TSH: TSH: 2.33 u[IU]/mL (ref 0.308–3.960)

## 2019-08-02 MED ORDER — SODIUM CHLORIDE 0.9 % IV SOLN
480.0000 mg | Freq: Once | INTRAVENOUS | Status: AC
  Administered 2019-08-02: 11:00:00 480 mg via INTRAVENOUS
  Filled 2019-08-02: qty 48

## 2019-08-02 MED ORDER — SODIUM CHLORIDE 0.9 % IV SOLN
Freq: Once | INTRAVENOUS | Status: AC
  Filled 2019-08-02: qty 250

## 2019-08-02 MED ORDER — SODIUM CHLORIDE 0.9% FLUSH
10.0000 mL | INTRAVENOUS | Status: DC | PRN
  Administered 2019-08-02: 10:00:00 10 mL via INTRAVENOUS
  Filled 2019-08-02: qty 10

## 2019-08-02 MED ORDER — HEPARIN SOD (PORK) LOCK FLUSH 100 UNIT/ML IV SOLN
500.0000 [IU] | Freq: Once | INTRAVENOUS | Status: AC | PRN
  Administered 2019-08-02: 12:00:00 500 [IU]
  Filled 2019-08-02: qty 5

## 2019-08-02 MED ORDER — SODIUM CHLORIDE 0.9% FLUSH
10.0000 mL | INTRAVENOUS | Status: DC | PRN
  Administered 2019-08-02: 12:00:00 10 mL
  Filled 2019-08-02: qty 10

## 2019-08-02 NOTE — Progress Notes (Signed)
[place] Telephone:([phone]   Fax:([phone]  OFFICE PROGRESS NOTE  Harris, [name], [name] [address] [place] [address]  DIAGNOSIS: Metastatic non-small cell lung cancer, adenocarcinoma. This was initially diagnosed as stage IB (T2a, N0, M0) in March of 2014, status post right upper lobectomy with lymph node dissection but the patient has evidence for disease recurrence in the right axilla status post resection.  PRIOR THERAPY:  1) Status post right axillary lymph node biopsy on 05/01/2014 under the care of Dr. Roxan Hockey. 2) Systemic chemotherapy with carboplatin for AUC of 5 and Alimta 500 mg/M2 every 3 weeks. First cycle 05/31/2014. Status post 6 cycles. Last dose was given 09/22/2014. 3) palliative radiotherapy to the subcarinal lymph node under the care of Dr. Sondra Come. 4) Nivolumab 240 mg IV every 2 weeks. First dose 12/23/2016. Status post 6 cycles.  CURRENT THERAPY:  Nivolumab 480 mg IV every 4 weeks. First dose 03/17/2017. Status post 31  cycles.  INTERVAL HISTORY: Nancy Hodges 73 y.o. female returns to the clinic today for follow-up visit.  The patient is feeling fine today with no concerning complaints.  She is feeling much better once she returned back to her brand name Cardizem.  She had a lot of trouble and weakness with the generic Cardizem.  The patient denied having any current chest pain, shortness of breath, cough or hemoptysis.  She denied having any fever or chills.  She has no nausea, vomiting, diarrhea or constipation.  She denied having any headache or visual changes.  She continues to tolerate her treatment with nivolumab fairly well.  She is here today for evaluation before starting cycle #32.  MEDICAL HISTORY: Past Medical History:  Diagnosis Date  . Allergic rhinitis   . Anxiety   . Arthritis   . Asthma   . Atrial flutter ([place])   . Cholelithiases 09/26/2015  . Diverticulosis   . Encounter for antineoplastic  immunotherapy 12/09/2016  . GERD (gastroesophageal reflux disease)   . H/O hiatal hernia   . History of blood transfusion   . History of chemotherapy   . History of kidney stones   . History of radiation therapy   . Internal hemorrhoid   . Lung cancer ([place])    a. Stage IB non-small cell carcinoma, s/p R VATS, wedge resection, RU lobectomy 10/2012.  . Paroxysmal atrial fibrillation ([facility])    a. anticoagulated with Xarelto  . Pneumonia 06/2016   [facility]  . Presence of permanent cardiac pacemaker   . Pulmonary nodule   . Tachycardia-bradycardia syndrome ([place])    a. 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    ALLERGIES:  is allergic to chlorpheniramine-dm; prednisone; septra [sulfamethoxazole-trimethoprim]; vioxx [rofecoxib]; and penicillins.  MEDICATIONS:  Current Outpatient Medications  Medication Sig Dispense Refill  . acetaminophen (TYLENOL) 500 MG tablet Take 500 mg by mouth 3 (three) times daily as needed for moderate pain or fever.     [name] albuterol (PROVENTIL HFA;VENTOLIN HFA) 108 (90 BASE) MCG/ACT inhaler Inhale 2 puffs into the lungs every 6 (six) hours as needed for wheezing or shortness of breath.     [name] azelastine (ASTELIN) 0.1 % nasal spray Place 1 spray into both nostrils 2 (two) times daily. Use in each nostril as directed    . calcium carbonate (TUMS - DOSED IN MG ELEMENTAL CALCIUM) 500 MG chewable tablet Chew 1 tablet by mouth 2 (two) times daily as needed for indigestion or heartburn.     [name]  diltiazem (CARTIA XT) 240 MG 24 hr capsule Take 1 capsule (240 mg total) by mouth daily. 90 capsule 2  . dimenhyDRINATE (DRAMAMINE) 50 MG tablet Take 12.5 mg by mouth every 8 (eight) hours as needed for dizziness.    [name] ipratropium-albuterol (DUONEB) 0.5-2.5 (3) MG/3ML SOLN Take 3 mLs by nebulization every 6 (six) hours as needed (shortness of breath or wheezing).     [name] lidocaine-prilocaine (EMLA) cream Apply 1 application topically as needed. (Patient taking differently: Apply 1  application topically as needed (for port). ) 30 g 4  . loratadine (CLARITIN) 10 MG tablet Take 10 mg by mouth daily.    [name] OVER THE COUNTER MEDICATION Take 5 mLs by mouth daily as needed (cough). Hyland's kids cough syrup    . Polyvinyl Alcohol-Povidone (REFRESH OP) Place 1 drop into both eyes daily as needed (dry eyes).    [name] tablet TAKE 1 TABLET(20 MG) BY MOUTH DAILY WITH SUPPER 30 tablet 5   No current facility-administered medications for this visit.    SURGICAL HISTORY:  Past Surgical History:  Procedure Laterality Date  . CARDIOVERSION N/A 02/19/2018   Procedure: CARDIOVERSION;  Surgeon: Sanda Klein, MD;  Location: [facility] ENDOSCOPY;  Service: Cardiovascular;  Laterality: N/A;  . CARDIOVERSION N/A 04/30/2018   Procedure: CARDIOVERSION;  Surgeon: Thayer Headings, MD;  Location: [facility] ENDOSCOPY;  Service: Cardiovascular;  Laterality: N/A;  . CATARACT EXTRACTION W/PHACO Right 08/14/2016   Procedure: CATARACT EXTRACTION PHACO AND INTRAOCULAR LENS PLACEMENT RIGHT EYE CDE 10.53;  Surgeon: Tonny Branch, MD;  [facility];  Service: Ophthalmology;  Laterality: Right;  right  . CATARACT EXTRACTION W/PHACO Left 09/25/2016   Procedure: CATARACT EXTRACTION PHACO AND INTRAOCULAR LENS PLACEMENT (IOC);  Surgeon: Tonny Branch, MD;  [facility];  Service: Ophthalmology;  Laterality: Left;  left CDE 8.81  . COLONOSCOPY     [facility]: cattered sigmoid diverticula, internal hemorrhoids, sessile polyp in ascending and mid transverse colon (tubular adenoma).   . colonoscopy with polypectomy  2015  . ESOPHAGOGASTRODUODENOSCOPY     [facility]: bile reflux in stomach, mild gastritis, hiatal hernia  . ESOPHAGOGASTRODUODENOSCOPY N/A 01/31/2016   Procedure: ESOPHAGOGASTRODUODENOSCOPY (EGD);  Surgeon: [name], MD;  [facility];  Service: Endoscopy;  Laterality: N/A;  1200  . IR FLUORO GUIDE PORT INSERTION RIGHT  06/30/2017  . IR US GUIDE VASC ACCESS RIGHT  06/30/2017  .  LOBECTOMY Right 10/27/2012   Procedure: LOBECTOMY;  Surgeon: [name], MD;  Location: [facility];  Service: Thoracic;  Laterality: Right;   RIGHT UPPER LOBECTOMY & Node Dissection  . LYMPH NODE BIOPSY Right 05/01/2014   Procedure: LYMPH NODE BIOPSY, Right Axillary;  Surgeon: [name], MD;  Location: [place];  Service: Thoracic;  Laterality: Right;  . PACEMAKER IMPLANT N/A 10/30/2017   SJM Assurity MRI PPM implanted by Dr [name] once leadless pacemaker battery depleted  . PARTIAL HYSTERECTOMY    . PERMANENT PACEMAKER INSERTION N/A 09/14/2012   Nanostim (SJM) leadless pacemaker (LEADLESS II STUDY PATIENT) implanted by Dr [name], no longer functioning, device abandoned.  [name] VIDEO ASSISTED THORACOSCOPY (VATS)/WEDGE RESECTION Right 10/27/2012   Procedure: VIDEO ASSISTED THORACOSCOPY (VATS),RGHT UPPER LOBE LUNG WEDGE RESECTION;  Surgeon: [name], MD;  Location: [facility];  Service: Thoracic;  Laterality: Right;    REVIEW OF SYSTEMS:  A comprehensive review of systems was negative except for: Constitutional: positive for fatigue   PHYSICAL EXAMINATION: General appearance: alert, cooperative and no distress Head: Normocephalic,  without obvious abnormality, atraumatic Neck: no adenopathy, no JVD, supple, symmetrical, trachea midline and thyroid not enlarged, symmetric, no tenderness/mass/nodules Lymph nodes: Cervical, supraclavicular, and axillary nodes normal. Resp: clear to auscultation bilaterally Back: symmetric, no curvature. ROM normal. No CVA tenderness. Cardio: regular rate and rhythm, S1, S2 normal, no murmur, click, rub or gallop GI: soft, non-tender; bowel sounds normal; no masses,  no organomegaly Extremities: extremities normal, atraumatic, no cyanosis or edema  ECOG PERFORMANCE STATUS: 1 - Symptomatic but completely ambulatory  Blood pressure 135/70, pulse 96, temperature 98.5 F (36.9 C), temperature source Oral, resp. rate 18, height 5\' 2"  (1.575 m), weight 153  lb 8 oz (69.6 kg), SpO2 97 %.  LABORATORY DATA: Lab Results  Component Value Date   WBC 5.8 08/02/2019   HGB 14.5 08/02/2019   HCT 41.1 08/02/2019   MCV 95.4 08/02/2019   PLT 213 08/02/2019      Chemistry      Component Value Date/Time   NA 138 08/02/2019 0937   NA 139 08/05/2017 0902   K 4.5 08/02/2019 0937   K 4.1 08/05/2017 0902   CL 103 08/02/2019 0937   CO2 27 08/02/2019 0937   CO2 28 08/05/2017 0902   BUN 12 08/02/2019 0937   BUN 10.1 08/05/2017 0902   CREATININE 0.80 08/02/2019 0937   CREATININE 0.8 08/05/2017 0902   GLU 134 (H) 02/18/2017 1410      Component Value Date/Time   CALCIUM 9.4 08/02/2019 0937   CALCIUM 9.5 08/05/2017 0902   ALKPHOS 111 08/02/2019 0937   ALKPHOS 111 08/05/2017 0902   AST 37 08/02/2019 0937   AST 33 08/05/2017 0902   ALT 36 08/02/2019 0937   ALT 34 08/05/2017 0902   BILITOT 1.3 (H) 08/02/2019 0937   BILITOT 1.39 (H) 08/05/2017 0902       RADIOGRAPHIC STUDIES: CUP PACEART REMOTE DEVICE CHECK  Result Date: 08/01/2019 Scheduled remote reviewed.  Normal device function.  AF burden 74%; ventricular rates controlled. On Xarelto. Next remote 91 days.   [place]  ASSESSMENT AND PLAN:  This is a very pleasant 73 years old white female with metastatic non-small cell lung cancer, adenocarcinoma with no actionable mutations status post systemic chemotherapy with carboplatin and Alimta and she is currently undergoing treatment with second line immunotherapy with Nivolumab status post 6 cycles. This was followed by immunotherapy with Nivolumab 480 mg IV every 4 weeks status post 31 cycles. The patient continues to tolerate her treatment well with no concerning adverse effects. I recommended for her to proceed with cycle #32 today as planned. I will see her back for follow-up visit in 4 weeks for evaluation after repeating CT scan of the chest, abdomen pelvis for restaging of her disease. The patient was advised to call immediately if she has  any concerning symptoms in the interval. The patient voices understanding of current disease status and treatment options and is in agreement with the current care plan. All questions were answered. The patient knows to call the clinic with any problems, questions or concerns. We can certainly see the patient much sooner if necessary.  Disclaimer: This note was dictated with voice recognition software. Similar sounding words can inadvertently be transcribed and may not be corrected upon review.

## 2019-08-02 NOTE — Patient Instructions (Signed)
[facility] Discharge Instructions for Patients Receiving Chemotherapy  Today you received the following chemotherapy agents:  Nivolumab  To help prevent nausea and vomiting after your treatment, we encourage you to take your nausea medication as prescribed.   If you develop nausea and vomiting that is not controlled by your nausea medication, call the clinic.   BELOW ARE SYMPTOMS THAT SHOULD BE REPORTED IMMEDIATELY:  *FEVER GREATER THAN 100.5 F  *CHILLS WITH OR WITHOUT FEVER  NAUSEA AND VOMITING THAT IS NOT CONTROLLED WITH YOUR NAUSEA MEDICATION  *UNUSUAL SHORTNESS OF BREATH  *UNUSUAL BRUISING OR BLEEDING  TENDERNESS IN MOUTH AND THROAT WITH OR WITHOUT PRESENCE OF ULCERS  *URINARY PROBLEMS  *BOWEL PROBLEMS  UNUSUAL RASH Items with * indicate a potential emergency and should be followed up as soon as possible.  Feel free to call the clinic should you have any questions or concerns. The clinic phone number is [phone].  Please show the CHEMO ALERT CARD at check-in to the Emergency Department and triage nurse.   

## 2019-08-03 ENCOUNTER — Telehealth: Payer: Self-pay | Admitting: Medical Oncology

## 2019-08-03 ENCOUNTER — Telehealth: Payer: Self-pay | Admitting: Internal Medicine

## 2019-08-03 NOTE — Telephone Encounter (Signed)
States he does not get labs the day of her CT scan 9 1/22/) since they were done yesterday  . I told pt I will move port flush and labs to the day she sees [facility]. Schedule message sent.

## 2019-08-03 NOTE — Telephone Encounter (Signed)
Scheduled per 12/29 los. Called and left msg.

## 2019-08-12 ENCOUNTER — Telehealth (INDEPENDENT_AMBULATORY_CARE_PROVIDER_SITE_OTHER): Payer: Medicare Other | Admitting: Internal Medicine

## 2019-08-12 ENCOUNTER — Encounter: Payer: Self-pay | Admitting: Internal Medicine

## 2019-08-12 VITALS — BP 139/73 | HR 73 | Ht 62.0 in | Wt 153.0 lb

## 2019-08-12 DIAGNOSIS — I4819 Other persistent atrial fibrillation: Secondary | ICD-10-CM | POA: Diagnosis not present

## 2019-08-12 DIAGNOSIS — I495 Sick sinus syndrome: Secondary | ICD-10-CM | POA: Diagnosis not present

## 2019-08-12 NOTE — Progress Notes (Signed)
Electrophysiology TeleHealth Note  Due to national recommendations of social distancing due to [place] 19, an audio telehealth visit is felt to be most appropriate for this patient at this time.  Verbal consent was obtained by me for the telehealth visit today.  The patient does not have capability for a virtual visit.  A phone visit is therefore required today.   Date:  08/12/2019   ID:  Nancy Hodges, DOB Nov 27, 1945, MRN [id number]  Location: patient's home  Provider location:  [place]  Evaluation Performed: Follow-up visit  PCP:  [name], [name]   Electrophysiologist:  Dr [name]  Chief Complaint:  palpitations  History of Present Illness:    Nancy Hodges is a 74 y.o. female who presents via telehealth conferencing today.  Since last being seen in our clinic, the patient reports doing very well.  Today, she denies symptoms of palpitations, chest pain, shortness of breath,  lower extremity edema, dizziness, presyncope, or syncope.  The patient is otherwise without complaint today.  The patient denies symptoms of fevers, chills, cough, or new SOB worrisome for COVID 19.  Past Medical History:  Diagnosis Date  . Allergic rhinitis   . Anxiety   . Arthritis   . Asthma   . Atrial flutter ([place])   . Cholelithiases 09/26/2015  . Diverticulosis   . Encounter for antineoplastic immunotherapy 12/09/2016  . GERD (gastroesophageal reflux disease)   . H/O hiatal hernia   . History of blood transfusion   . History of chemotherapy   . History of kidney stones   . History of radiation therapy   . Internal hemorrhoid   . Lung cancer ([place])    a. Stage IB non-small cell carcinoma, s/p R VATS, wedge resection, RU lobectomy 10/2012.  . Paroxysmal atrial fibrillation ([facility])    a. anticoagulated with Xarelto  . Pneumonia 06/2016   [facility]  . Presence of permanent cardiac pacemaker   . Pulmonary nodule   . Tachycardia-bradycardia syndrome ([place])    a. s/p  Nanostim Leadless pacemaker 09/2012.    Past Surgical History:  Procedure Laterality Date  . CARDIOVERSION N/A 02/19/2018   Procedure: CARDIOVERSION;  Surgeon: Sanda Klein, MD;  Location: [facility];  Service: Cardiovascular;  Laterality: N/A;  . CARDIOVERSION N/A 04/30/2018   Procedure: CARDIOVERSION;  Surgeon: Thayer Headings, MD;  Location: [facility] ENDOSCOPY;  Service: Cardiovascular;  Laterality: N/A;  . CATARACT EXTRACTION W/PHACO Right 08/14/2016   Procedure: CATARACT EXTRACTION PHACO AND INTRAOCULAR LENS PLACEMENT RIGHT EYE CDE 10.53;  Surgeon: Tonny Branch, MD;  Location: [facility];  Service: Ophthalmology;  Laterality: Right;  right  . CATARACT EXTRACTION W/PHACO Left 09/25/2016   Procedure: CATARACT EXTRACTION PHACO AND INTRAOCULAR LENS PLACEMENT (IOC);  Surgeon: Tonny Branch, MD;  Location: [facility];  Service: Ophthalmology;  Laterality: Left;  left CDE 8.81  . COLONOSCOPY     [facility]: cattered sigmoid diverticula, internal hemorrhoids, sessile polyp in ascending and mid transverse colon (tubular adenoma).   . colonoscopy with polypectomy  2015  . ESOPHAGOGASTRODUODENOSCOPY     [facility]: bile reflux in stomach, mild gastritis, hiatal hernia  . ESOPHAGOGASTRODUODENOSCOPY N/A 01/31/2016   Procedure: ESOPHAGOGASTRODUODENOSCOPY (EGD);  Surgeon: [name], MD;  [facility]: [facility];  Service: Endoscopy;  Laterality: N/A;  1200  . IR FLUORO GUIDE PORT INSERTION RIGHT  06/30/2017  . IR US GUIDE VASC ACCESS RIGHT  06/30/2017  . LOBECTOMY Right 10/27/2012   Procedure: LOBECTOMY;  Surgeon: [name], MD;  Location: [facility];  Service: Thoracic;  Laterality: Right;   RIGHT UPPER LOBECTOMY & Node Dissection  . LYMPH NODE BIOPSY Right 05/01/2014   Procedure: LYMPH NODE BIOPSY, Right Axillary;  Surgeon: [name], MD;  Location: [facility];  Service: Thoracic;  Laterality: Right;  . PACEMAKER IMPLANT N/A 10/30/2017   SJM Assurity MRI PPM implanted by Dr [name] once leadless pacemaker  battery depleted  . PARTIAL HYSTERECTOMY    . PERMANENT PACEMAKER INSERTION N/A 09/14/2012   Nanostim (SJM) leadless pacemaker (LEADLESS II STUDY PATIENT) implanted by Dr [name], no longer functioning, device abandoned.  [name] VIDEO ASSISTED THORACOSCOPY (VATS)/WEDGE RESECTION Right 10/27/2012   Procedure: VIDEO ASSISTED THORACOSCOPY (VATS),RGHT UPPER LOBE LUNG WEDGE RESECTION;  Surgeon: [name], MD;  Location: [facility];  Service: Thoracic;  Laterality: Right;    Current Outpatient Medications  Medication Sig Dispense Refill  . acetaminophen (TYLENOL) 500 MG tablet Take 500 mg by mouth 3 (three) times daily as needed for moderate pain or fever.     [name] albuterol (PROVENTIL HFA;VENTOLIN HFA) 108 (90 BASE) MCG/ACT inhaler Inhale 2 puffs into the lungs every 6 (six) hours as needed for wheezing or shortness of breath.     [name] azelastine (ASTELIN) 0.1 % nasal spray Place 1 spray into both nostrils 2 (two) times daily. Use in each nostril as directed    . calcium carbonate (TUMS - DOSED IN MG ELEMENTAL CALCIUM) 500 MG chewable tablet Chew 1 tablet by mouth 2 (two) times daily as needed for indigestion or heartburn.     . diltiazem (CARTIA XT) 240 MG 24 hr capsule Take 1 capsule (240 mg total) by mouth daily. 90 capsule 2  . dimenhyDRINATE (DRAMAMINE) 50 MG tablet Take 12.5 mg by mouth every 8 (eight) hours as needed for dizziness.    [name] ipratropium-albuterol (DUONEB) 0.5-2.5 (3) MG/3ML SOLN Take 3 mLs by nebulization every 6 (six) hours as needed (shortness of breath or wheezing).     [name] lidocaine-prilocaine (EMLA) cream Apply 1 application topically as needed. (Patient taking differently: Apply 1 application topically as needed (for port). ) 30 g 4  . loratadine (CLARITIN) 10 MG tablet Take 10 mg by mouth daily.    [name] OVER THE COUNTER MEDICATION Take 5 mLs by mouth daily as needed (cough). H[employer]'s kids cough syrup    . Polyvinyl Alcohol-Povidone (REFRESH OP) Place 1 drop into both eyes daily as needed  (dry eyes).    [name] tablet TAKE 1 TABLET(20 MG) BY MOUTH DAILY WITH SUPPER 30 tablet 5   No current facility-administered medications for this visit.    Allergies:   Chlorpheniramine-dm, Prednisone, Septra [sulfamethoxazole-trimethoprim], Vioxx [rofecoxib], and Penicillins   Social History:  The patient  reports that she quit smoking about 24 years ago. Her smoking use included cigarettes. She has a 80.00 pack-year smoking history. She has never used smokeless tobacco. She reports that she does not drink alcohol or use drugs.   Family History:  The patient's family history includes Asthma in her father; Diabetes in her brother, mother, and sister; Heart attack in her son; Heart disease in her father and mother; Kidney cancer in her mother; Lung cancer in her sister; Pancreatic cancer in her brother.   ROS:  Please see the history of present illness.   All other systems are personally reviewed and negative.    Exam:    Vital Signs:  BP 139/73   Pulse 73   Ht 5\' 2"  (1.575 m)  Wt 153 lb (69.4 kg)   BMI 27.98 kg/m   Well sounding, alert and conversant   Labs/Other Tests and Data Reviewed:    Recent Labs: 08/02/2019: ALT 36; BUN 12; Creatinine 0.80; Hemoglobin 14.5; Platelet Count 213; Potassium 4.5; Sodium 138; TSH 2.330   Wt Readings from Last 3 Encounters:  08/12/19 153 lb (69.4 kg)  08/02/19 153 lb 8 oz (69.6 kg)  07/05/19 153 lb 3.2 oz (69.5 kg)     Last device remote is reviewed from [employer] which reveals normal device function, afib burden in 69%   ASSESSMENT & PLAN:    1.  Persistent afib She is tolerating her afib reasonably well with rate control She has deferred ablation due to COVID 19 chads2vasc score is 3.  She is on xarleto Her afib burden is 69% but V rates are improved  2. Sinus bradycardia Remotes are up to date   Follow-up:  with me in a year   Patient Risk:  after full review of this patients clinical status, I feel that they  are at moderate risk at this time.  Today, I have spent 15 minutes with the patient with telehealth technology discussing arrhythmia management .    Army Fossa, MD  08/12/2019 9:38 AM     [facility] [address]  [place] [address] [phone] (office) [phone] (fax)

## 2019-08-12 NOTE — Progress Notes (Signed)
Correction,  Follow-up with me in 6 months

## 2019-08-24 ENCOUNTER — Other Ambulatory Visit: Payer: Self-pay | Admitting: Internal Medicine

## 2019-08-26 ENCOUNTER — Encounter (HOSPITAL_COMMUNITY): Payer: Self-pay

## 2019-08-26 ENCOUNTER — Other Ambulatory Visit: Payer: Self-pay

## 2019-08-26 ENCOUNTER — Ambulatory Visit (HOSPITAL_COMMUNITY)
Admission: RE | Admit: 2019-08-26 | Discharge: 2019-08-26 | Disposition: A | Discharge status: Home / Self Care | Payer: Medicare Other | Source: Ambulatory Visit | Attending: Internal Medicine | Admitting: Internal Medicine

## 2019-08-26 ENCOUNTER — Other Ambulatory Visit: Payer: Medicare Other

## 2019-08-26 DIAGNOSIS — C3490 Malignant neoplasm of unspecified part of unspecified bronchus or lung: Secondary | ICD-10-CM | POA: Diagnosis present

## 2019-08-26 MED ORDER — HEPARIN SOD (PORK) LOCK FLUSH 100 UNIT/ML IV SOLN
INTRAVENOUS | Status: AC
  Filled 2019-08-26: qty 5

## 2019-08-26 MED ORDER — SODIUM CHLORIDE (PF) 0.9 % IJ SOLN
INTRAMUSCULAR | Status: AC
  Filled 2019-08-26: qty 50

## 2019-08-26 MED ORDER — HEPARIN SOD (PORK) LOCK FLUSH 100 UNIT/ML IV SOLN
500.0000 [IU] | Freq: Once | INTRAVENOUS | Status: AC
  Administered 2019-08-26: 500 [IU] via INTRAVENOUS

## 2019-08-26 MED ORDER — IOHEXOL 300 MG/ML  SOLN
100.0000 mL | Freq: Once | INTRAMUSCULAR | Status: AC | PRN
  Administered 2019-08-26: 100 mL via INTRAVENOUS

## 2019-08-30 ENCOUNTER — Inpatient Hospital Stay: Payer: Medicare Other | Attending: Internal Medicine | Admitting: Internal Medicine

## 2019-08-30 ENCOUNTER — Other Ambulatory Visit: Payer: Medicare Other

## 2019-08-30 ENCOUNTER — Inpatient Hospital Stay: Payer: Medicare Other

## 2019-08-30 ENCOUNTER — Ambulatory Visit: Payer: Medicare Other | Admitting: Internal Medicine

## 2019-08-30 ENCOUNTER — Encounter: Payer: Self-pay | Admitting: Internal Medicine

## 2019-08-30 ENCOUNTER — Other Ambulatory Visit: Payer: Self-pay

## 2019-08-30 ENCOUNTER — Ambulatory Visit: Payer: Medicare Other

## 2019-08-30 VITALS — BP 131/80 | HR 62 | Temp 97.6°F | Resp 18 | Ht 62.0 in | Wt 154.5 lb

## 2019-08-30 DIAGNOSIS — C3491 Malignant neoplasm of unspecified part of right bronchus or lung: Secondary | ICD-10-CM

## 2019-08-30 DIAGNOSIS — C773 Secondary and unspecified malignant neoplasm of axilla and upper limb lymph nodes: Secondary | ICD-10-CM | POA: Diagnosis present

## 2019-08-30 DIAGNOSIS — Z95828 Presence of other vascular implants and grafts: Secondary | ICD-10-CM

## 2019-08-30 DIAGNOSIS — Z79899 Other long term (current) drug therapy: Secondary | ICD-10-CM | POA: Insufficient documentation

## 2019-08-30 DIAGNOSIS — Z5112 Encounter for antineoplastic immunotherapy: Secondary | ICD-10-CM | POA: Insufficient documentation

## 2019-08-30 DIAGNOSIS — C3411 Malignant neoplasm of upper lobe, right bronchus or lung: Secondary | ICD-10-CM | POA: Insufficient documentation

## 2019-08-30 DIAGNOSIS — I495 Sick sinus syndrome: Secondary | ICD-10-CM

## 2019-08-30 DIAGNOSIS — E032 Hypothyroidism due to medicaments and other exogenous substances: Secondary | ICD-10-CM

## 2019-08-30 LAB — CBC WITH DIFFERENTIAL (CANCER CENTER ONLY)
Abs Immature Granulocytes: 0.02 10*3/uL (ref 0.00–0.07)
Basophils Absolute: 0.1 10*3/uL (ref 0.0–0.1)
Basophils Relative: 1 %
Eosinophils Absolute: 0.2 10*3/uL (ref 0.0–0.5)
Eosinophils Relative: 3 %
HCT: 38.5 % (ref 36.0–46.0)
Hemoglobin: 13.5 g/dL (ref 12.0–15.0)
Immature Granulocytes: 0 %
Lymphocytes Relative: 24 %
Lymphs Abs: 1.5 10*3/uL (ref 0.7–4.0)
MCH: 33.1 pg (ref 26.0–34.0)
MCHC: 35.1 g/dL (ref 30.0–36.0)
MCV: 94.4 fL (ref 80.0–100.0)
Monocytes Absolute: 0.5 10*3/uL (ref 0.1–1.0)
Monocytes Relative: 8 %
Neutro Abs: 3.9 10*3/uL (ref 1.7–7.7)
Neutrophils Relative %: 64 %
Platelet Count: 189 10*3/uL (ref 150–400)
RBC: 4.08 MIL/uL (ref 3.87–5.11)
RDW: 12.8 % (ref 11.5–15.5)
WBC Count: 6.1 10*3/uL (ref 4.0–10.5)
nRBC: 0 % (ref 0.0–0.2)

## 2019-08-30 LAB — CMP (CANCER CENTER ONLY)
ALT: 34 U/L (ref 0–44)
AST: 37 U/L (ref 15–41)
Albumin: 3.9 g/dL (ref 3.5–5.0)
Alkaline Phosphatase: 107 U/L (ref 38–126)
Anion gap: 8 (ref 5–15)
BUN: 13 mg/dL (ref 8–23)
CO2: 28 mmol/L (ref 22–32)
Calcium: 9.1 mg/dL (ref 8.9–10.3)
Chloride: 105 mmol/L (ref 98–111)
Creatinine: 0.76 mg/dL (ref 0.44–1.00)
GFR, Est AFR Am: >60 mL/min (ref >60)
GFR, Estimated: >60 mL/min (ref >60)
Glucose, Bld: 145 mg/dL — ABNORMAL HIGH (ref 70–99)
Potassium: 4.3 mmol/L (ref 3.5–5.1)
Sodium: 141 mmol/L (ref 135–145)
Total Bilirubin: 1 mg/dL (ref 0.3–1.2)
Total Protein: 6.8 g/dL (ref 6.5–8.1)

## 2019-08-30 LAB — TSH: TSH: 5.355 u[IU]/mL — ABNORMAL HIGH (ref 0.308–3.960)

## 2019-08-30 MED ORDER — SODIUM CHLORIDE 0.9 % IV SOLN
480.0000 mg | Freq: Once | INTRAVENOUS | Status: AC
  Administered 2019-08-30: 480 mg via INTRAVENOUS
  Filled 2019-08-30: qty 48

## 2019-08-30 MED ORDER — SODIUM CHLORIDE 0.9% FLUSH
10.0000 mL | INTRAVENOUS | Status: DC | PRN
  Administered 2019-08-30: 10 mL
  Filled 2019-08-30: qty 10

## 2019-08-30 MED ORDER — HEPARIN SOD (PORK) LOCK FLUSH 100 UNIT/ML IV SOLN
500.0000 [IU] | Freq: Once | INTRAVENOUS | Status: AC | PRN
  Administered 2019-08-30: 500 [IU]
  Filled 2019-08-30: qty 5

## 2019-08-30 MED ORDER — SODIUM CHLORIDE 0.9 % IV SOLN
Freq: Once | INTRAVENOUS | Status: AC
  Filled 2019-08-30: qty 250

## 2019-08-30 MED ORDER — SODIUM CHLORIDE 0.9% FLUSH
10.0000 mL | INTRAVENOUS | Status: DC | PRN
  Administered 2019-08-30: 10 mL via INTRAVENOUS
  Filled 2019-08-30: qty 10

## 2019-08-30 NOTE — Patient Instructions (Signed)
[facility] Discharge Instructions for Patients Receiving Chemotherapy  Today you received the following chemotherapy agents Opdivo  To help prevent nausea and vomiting after your treatment, we encourage you to take your nausea medication as directed   If you develop nausea and vomiting that is not controlled by your nausea medication, call the clinic.   BELOW ARE SYMPTOMS THAT SHOULD BE REPORTED IMMEDIATELY:  *FEVER GREATER THAN 100.5 F  *CHILLS WITH OR WITHOUT FEVER  NAUSEA AND VOMITING THAT IS NOT CONTROLLED WITH YOUR NAUSEA MEDICATION  *UNUSUAL SHORTNESS OF BREATH  *UNUSUAL BRUISING OR BLEEDING  TENDERNESS IN MOUTH AND THROAT WITH OR WITHOUT PRESENCE OF ULCERS  *URINARY PROBLEMS  *BOWEL PROBLEMS  UNUSUAL RASH Items with * indicate a potential emergency and should be followed up as soon as possible.  Feel free to call the clinic should you have any questions or concerns. The clinic phone number is [phone].  Please show the CHEMO ALERT CARD at check-in to the Emergency Department and triage nurse.   

## 2019-08-30 NOTE — Progress Notes (Signed)
Nancy Hodges [phone]) [phone]   [phone]  OFFICE PROGRESS NOTE  Harris, [name], [name] [address] [place] [address]  DIAGNOSIS: Metastatic non-small cell lung cancer, adenocarcinoma. This was initially diagnosed as stage IB (T2a, N0, M0) in March of 2014, status post right upper lobectomy with lymph node dissection but the patient has evidence for disease recurrence in the right axilla status post resection.  PRIOR THERAPY:  1) Status post right axillary lymph node biopsy on 05/01/2014 under the care of Dr. Roxan Hockey. 2) Systemic chemotherapy with carboplatin for AUC of 5 and Alimta 500 mg/M2 every 3 weeks. First cycle 05/31/2014. Status post 6 cycles. Last dose was given 09/22/2014. 3) palliative radiotherapy to the subcarinal lymph node under the care of Dr. Sondra Come. 4) Nivolumab 240 mg IV every 2 weeks. First dose 12/23/2016. Status post 6 cycles.  CURRENT THERAPY:  Nivolumab 480 mg IV every 4 weeks. First dose 03/17/2017. Status post 32  cycles.  INTERVAL HISTORY: Nancy Hodges 74 y.o. female returns to the clinic today for follow-up visit.  The patient is feeling fine today with no concerning complaints except for mild fatigue.  She denied having any current chest pain, shortness of breath, cough or hemoptysis.  She denied having any fever or chills.  She has no nausea, vomiting, diarrhea or constipation.  She has no headache or visual changes.  The patient continues to tolerate her treatment with nivolumab fairly well.  She had repeat CT scan of the chest, abdomen pelvis performed recently and she is here for evaluation and discussion of her scan results.  MEDICAL HISTORY: Past Medical History:  Diagnosis Date  . Allergic rhinitis   . Anxiety   . Arthritis   . Asthma   . Atrial flutter ([place])   . Cholelithiases 09/26/2015  . Diverticulosis   . Encounter for antineoplastic immunotherapy 12/09/2016  . GERD (gastroesophageal reflux  disease)   . H/O hiatal hernia   . History of blood transfusion   . History of chemotherapy   . History of kidney stones   . History of radiation therapy   . Internal hemorrhoid   . Lung cancer ([place])    a. Stage IB non-small cell carcinoma, s/p R VATS, wedge resection, RU lobectomy 10/2012.  [name] Persistent atrial fibrillation ([facility])    a. anticoagulated with Xarelto  . Pneumonia 06/2016   [facility]  . Presence of permanent cardiac pacemaker   . Pulmonary nodule   . Tachycardia-bradycardia syndrome ([place])    a. 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    ALLERGIES:  is allergic to chlorpheniramine-dm; prednisone; septra [sulfamethoxazole-trimethoprim]; vioxx [rofecoxib]; and penicillins.  MEDICATIONS:  Current Outpatient Medications  Medication Sig Dispense Refill  . acetaminophen (TYLENOL) 500 MG tablet Take 500 mg by mouth 3 (three) times daily as needed for moderate pain or fever.     [name] albuterol (PROVENTIL HFA;VENTOLIN HFA) 108 (90 BASE) MCG/ACT inhaler Inhale 2 puffs into the lungs every 6 (six) hours as needed for wheezing or shortness of breath.     [name] azelastine (ASTELIN) 0.1 % nasal spray Place 1 spray into both nostrils 2 (two) times daily. Use in each nostril as directed    . calcium carbonate (TUMS - DOSED IN MG ELEMENTAL CALCIUM) 500 MG chewable tablet Chew 1 tablet by mouth 2 (two) times daily as needed for indigestion or heartburn.     . diltiazem (CARTIA XT) 240 MG 24 hr capsule Take 1  capsule (240 mg total) by mouth daily. 90 capsule 2  . dimenhyDRINATE (DRAMAMINE) 50 MG tablet Take 12.5 mg by mouth every 8 (eight) hours as needed for dizziness.    [name] ipratropium-albuterol (DUONEB) 0.5-2.5 (3) MG/3ML SOLN Take 3 mLs by nebulization every 6 (six) hours as needed (shortness of breath or wheezing).     [name] lidocaine-prilocaine (EMLA) cream APPLY TOPICALLY TO TO THE AFFECTED AREA AS NEEDED 30 g 4  . loratadine (CLARITIN) 10 MG tablet Take 10 mg by mouth daily.    [name] OVER THE  COUNTER MEDICATION Take 5 mLs by mouth daily as needed (cough). H[employer]'s kids cough syrup    . Polyvinyl Alcohol-Povidone (REFRESH OP) Place 1 drop into both eyes daily as needed (dry eyes).    [name] tablet TAKE 1 TABLET(20 MG) BY MOUTH DAILY WITH SUPPER 30 tablet 5   No current facility-administered medications for this visit.    SURGICAL HISTORY:  Past Surgical History:  Procedure Laterality Date  . CARDIOVERSION N/A 02/19/2018   Procedure: CARDIOVERSION;  Surgeon: Sanda Klein, MD;  Location: [facility];  Service: Cardiovascular;  Laterality: N/A;  . CARDIOVERSION N/A 04/30/2018   Procedure: CARDIOVERSION;  Surgeon: Thayer Headings, MD;  Location: [facility]COPY;  Service: Cardiovascular;  Laterality: N/A;  . CATARACT EXTRACTION W/PHACO Right 08/14/2016   Procedure: CATARACT EXTRACTION PHACO AND INTRAOCULAR LENS PLACEMENT RIGHT EYE CDE 10.53;  Surgeon: Tonny Branch, MD;  Location: [facility];  Service: Ophthalmology;  Laterality: Right;  right  . CATARACT EXTRACTION W/PHACO Left 09/25/2016   Procedure: CATARACT EXTRACTION PHACO AND INTRAOCULAR LENS PLACEMENT (IOC);  Surgeon: Tonny Branch, MD;  Location: [facility];  Service: Ophthalmology;  Laterality: Left;  left CDE 8.81  . COLONOSCOPY     [facility]: cattered sigmoid diverticula, internal hemorrhoids, sessile polyp in ascending and mid transverse colon (tubular adenoma).   . colonoscopy with polypectomy  2015  . ESOPHAGOGASTRODUODENOSCOPY     [facility]: bile reflux in stomach, mild gastritis, hiatal hernia  . ESOPHAGOGASTRODUODENOSCOPY N/A 01/31/2016   Procedure: ESOPHAGOGASTRODUODENOSCOPY (EGD);  Surgeon: [name], MD;  [facility]: [facility];  Service: Endoscopy;  Laterality: N/A;  1200  . IR FLUORO GUIDE PORT INSERTION RIGHT  06/30/2017  . IR US GUIDE VASC ACCESS RIGHT  06/30/2017  . LOBECTOMY Right 10/27/2012   Procedure: LOBECTOMY;  Surgeon: [name], MD;  Location: [facility];  Service: Thoracic;  Laterality:  Right;   RIGHT UPPER LOBECTOMY & Node Dissection  . LYMPH NODE BIOPSY Right 05/01/2014   Procedure: LYMPH NODE BIOPSY, Right Axillary;  Surgeon: [name], MD;  Location: [facility];  Service: Thoracic;  Laterality: Right;  . PACEMAKER IMPLANT N/A 10/30/2017   SJM Assurity MRI PPM implanted by Dr [name] once leadless pacemaker battery depleted  . PARTIAL HYSTERECTOMY    . PERMANENT PACEMAKER INSERTION N/A 09/14/2012   Nanostim (SJM) leadless pacemaker (LEADLESS II STUDY PATIENT) implanted by Dr [name], no longer functioning, device abandoned.  [name] VIDEO ASSISTED THORACOSCOPY (VATS)/WEDGE RESECTION Right 10/27/2012   Procedure: VIDEO ASSISTED THORACOSCOPY (VATS),RGHT UPPER LOBE LUNG WEDGE RESECTION;  Surgeon: [name], MD;  Location: [facility];  Service: Thoracic;  Laterality: Right;    REVIEW OF SYSTEMS:  Constitutional: positive for fatigue Eyes: negative Ears, nose, mouth, throat, and face: negative Respiratory: negative Cardiovascular: negative Gastrointestinal: negative Genitourinary:negative Integument/breast: negative Hematologic/lymphatic: negative Musculoskeletal:negative Neurological: negative Behavioral/Psych: negative Endocrine: negative Allergic/Immunologic: negative   PHYSICAL EXAMINATION: General appearance: alert, cooperative and no distress Head:  Normocephalic, without obvious abnormality, atraumatic Neck: no adenopathy, no JVD, supple, symmetrical, trachea midline and thyroid not enlarged, symmetric, no tenderness/mass/nodules Lymph nodes: Cervical, supraclavicular, and axillary nodes normal. Resp: clear to auscultation bilaterally Back: symmetric, no curvature. ROM normal. No CVA tenderness. Cardio: regular rate and rhythm, S1, S2 normal, no murmur, click, rub or gallop GI: soft, non-tender; bowel sounds normal; no masses,  no organomegaly Extremities: extremities normal, atraumatic, no cyanosis or edema Neurologic: Alert and oriented X 3, normal strength  and tone. Normal symmetric reflexes. Normal coordination and gait  ECOG PERFORMANCE STATUS: 1 - Symptomatic but completely ambulatory  Blood pressure 131/80, pulse 62, temperature 97.6 F (36.4 C), temperature source Temporal, resp. rate 18, height 5\' 2"  (1.575 m), weight 154 lb 8 oz (70.1 kg), SpO2 99 %.  LABORATORY DATA: Lab Results  Component Value Date   WBC 6.1 08/30/2019   HGB 13.5 08/30/2019   HCT 38.5 08/30/2019   MCV 94.4 08/30/2019   PLT 189 08/30/2019      Chemistry      Component Value Date/Time   NA 141 08/30/2019 0931   NA 139 08/05/2017 0902   K 4.3 08/30/2019 0931   K 4.1 08/05/2017 0902   CL 105 08/30/2019 0931   CO2 28 08/30/2019 0931   CO2 28 08/05/2017 0902   BUN 13 08/30/2019 0931   BUN 10.1 08/05/2017 0902   CREATININE 0.76 08/30/2019 0931   CREATININE 0.8 08/05/2017 0902   GLU 134 (H) 02/18/2017 1410      Component Value Date/Time   CALCIUM 9.1 08/30/2019 0931   CALCIUM 9.5 08/05/2017 0902   ALKPHOS 107 08/30/2019 0931   ALKPHOS 111 08/05/2017 0902   AST 37 08/30/2019 0931   AST 33 08/05/2017 0902   ALT 34 08/30/2019 0931   ALT 34 08/05/2017 0902   BILITOT 1.0 08/30/2019 0931   BILITOT 1.39 (H) 08/05/2017 0902       RADIOGRAPHIC STUDIES: CT Chest W Contrast  Result Date: 08/26/2019 CLINICAL DATA:  Non-small-cell lung cancer. Restaging. EXAM: CT CHEST, ABDOMEN, AND PELVIS WITH CONTRAST TECHNIQUE: Multidetector CT imaging of the chest, abdomen and pelvis was performed following the standard protocol during bolus administration of intravenous contrast. CONTRAST:  134mL OMNIPAQUE IOHEXOL 300 MG/ML  SOLN COMPARISON:  06/03/2019 FINDINGS: CT CHEST FINDINGS Lower chest: The heart size is normal. No substantial pericardial effusion. Coronary artery calcification is evident. Atherosclerotic calcification is noted in the wall of the thoracic aorta. Right Port-A-Cath tip is positioned at the SVC/RA junction. Left permanent pacemaker noted.  Mediastinum/Nodes: No mediastinal lymphadenopathy. There is no hilar lymphadenopathy. The esophagus has normal imaging features. There is no axillary lymphadenopathy. Lungs/Pleura: Volume loss right hemithorax compatible with right upper lobectomy. Retro hilar scarring in the medial right lung is stable. Centrilobular emphsyema noted. Several scattered tiny (less than 3 mm) pulmonary nodules are again noted bilaterally, stable in the interval. Representative examples can be seen in the right lung (60/6), left upper lobe (38/6). No pleural effusion. Musculoskeletal: No worrisome lytic or sclerotic osseous abnormality. CT ABDOMEN PELVIS FINDINGS Hepatobiliary: No suspicious focal abnormality within the liver parenchyma. Nodular hepatic contour again noted compatible with cirrhosis. Calcified gallstones again noted. No intrahepatic or extrahepatic biliary dilation. Pancreas: No focal mass lesion. No dilatation of the main duct. No intraparenchymal cyst. No peripancreatic edema. Spleen: Stable small hypodensity in the inferior spleen, likely cyst or pseudocyst. Adrenals/Urinary Tract: No adrenal nodule or mass. Kidneys unremarkable. No evidence for hydroureter. The urinary bladder appears normal for  the degree of distention. Stomach/Bowel: Tiny hiatal hernia. Stomach otherwise unremarkable. Duodenum is normally positioned as is the ligament of Treitz. Small duodenal diverticulum noted. No small bowel wall thickening. No small bowel dilatation. The terminal ileum is normal. The appendix is normal. No gross colonic mass. No colonic wall thickening. Diverticular changes are noted in the left colon without evidence of diverticulitis. Vascular/Lymphatic: There is abdominal aortic atherosclerosis without aneurysm. Portal vein patent. Recanalization of the paraumbilical vein and paraesophageal varices suggest portal venous hypertension. Upper normal lymph nodes again noted in the hepatoduodenal ligament, stable and most likely  reactive. Small gastrohepatic ligament lymph nodes are also similar to prior. No retroperitoneal lymphadenopathy. Reproductive: The uterus is surgically absent. There is no adnexal mass. Other: No intraperitoneal free fluid. Musculoskeletal: No worrisome lytic or sclerotic osseous abnormality. IMPRESSION: 1. Stable exam. Status post right upper lobectomy. No findings on today's study to suggest local recurrence or metastatic disease in the chest, abdomen, or pelvis. 2. Cirrhosis with sequelae of portal venous hypertension. 3. Cholelithiasis. 4.  Emphysema (ICD10-J43.9) and Aortic Atherosclerosis (ICD10-170.0) Electronically Signed   By: Misty Stanley M.D.   On: 08/26/2019 12:22   CT Abdomen Pelvis W Contrast  Result Date: 08/26/2019 CLINICAL DATA:  Non-small-cell lung cancer. Restaging. EXAM: CT CHEST, ABDOMEN, AND PELVIS WITH CONTRAST TECHNIQUE: Multidetector CT imaging of the chest, abdomen and pelvis was performed following the standard protocol during bolus administration of intravenous contrast. CONTRAST:  110mL OMNIPAQUE IOHEXOL 300 MG/ML  SOLN COMPARISON:  06/03/2019 FINDINGS: CT CHEST FINDINGS Lower chest: The heart size is normal. No substantial pericardial effusion. Coronary artery calcification is evident. Atherosclerotic calcification is noted in the wall of the thoracic aorta. Right Port-A-Cath tip is positioned at the SVC/RA junction. Left permanent pacemaker noted. Mediastinum/Nodes: No mediastinal lymphadenopathy. There is no hilar lymphadenopathy. The esophagus has normal imaging features. There is no axillary lymphadenopathy. Lungs/Pleura: Volume loss right hemithorax compatible with right upper lobectomy. Retro hilar scarring in the medial right lung is stable. Centrilobular emphsyema noted. Several scattered tiny (less than 3 mm) pulmonary nodules are again noted bilaterally, stable in the interval. Representative examples can be seen in the right lung (60/6), left upper lobe (38/6). No  pleural effusion. Musculoskeletal: No worrisome lytic or sclerotic osseous abnormality. CT ABDOMEN PELVIS FINDINGS Hepatobiliary: No suspicious focal abnormality within the liver parenchyma. Nodular hepatic contour again noted compatible with cirrhosis. Calcified gallstones again noted. No intrahepatic or extrahepatic biliary dilation. Pancreas: No focal mass lesion. No dilatation of the main duct. No intraparenchymal cyst. No peripancreatic edema. Spleen: Stable small hypodensity in the inferior spleen, likely cyst or pseudocyst. Adrenals/Urinary Tract: No adrenal nodule or mass. Kidneys unremarkable. No evidence for hydroureter. The urinary bladder appears normal for the degree of distention. Stomach/Bowel: Tiny hiatal hernia. Stomach otherwise unremarkable. Duodenum is normally positioned as is the ligament of Treitz. Small duodenal diverticulum noted. No small bowel wall thickening. No small bowel dilatation. The terminal ileum is normal. The appendix is normal. No gross colonic mass. No colonic wall thickening. Diverticular changes are noted in the left colon without evidence of diverticulitis. Vascular/Lymphatic: There is abdominal aortic atherosclerosis without aneurysm. Portal vein patent. Recanalization of the paraumbilical vein and paraesophageal varices suggest portal venous hypertension. Upper normal lymph nodes again noted in the hepatoduodenal ligament, stable and most likely reactive. Small gastrohepatic ligament lymph nodes are also similar to prior. No retroperitoneal lymphadenopathy. Reproductive: The uterus is surgically absent. There is no adnexal mass. Other: No intraperitoneal free fluid. Musculoskeletal: No worrisome  lytic or sclerotic osseous abnormality. IMPRESSION: 1. Stable exam. Status post right upper lobectomy. No findings on today's study to suggest local recurrence or metastatic disease in the chest, abdomen, or pelvis. 2. Cirrhosis with sequelae of portal venous hypertension. 3.  Cholelithiasis. 4.  Emphysema (ICD10-J43.9) and Aortic Atherosclerosis (ICD10-170.0) Electronically Signed   By: Misty Stanley M.D.   On: 08/26/2019 12:22   CUP PACEART REMOTE DEVICE CHECK  Result Date: 08/01/2019 Scheduled remote reviewed.  Normal device function.  AF burden 74%; ventricular rates controlled. On Xarelto. Next remote 91 days.   [facility]  ASSESSMENT AND PLAN:  This is a very pleasant 74 years old white female with metastatic non-small cell lung cancer, adenocarcinoma with no actionable mutations status post systemic chemotherapy with carboplatin and Alimta and she is currently undergoing treatment with second line immunotherapy with Nivolumab status post 6 cycles. This was followed by immunotherapy with Nivolumab 480 mg IV every 4 weeks status post 32 cycles. The patient continues to tolerate this treatment well with no concerning adverse effects. She had repeat CT scan of the chest, abdomen pelvis performed recently.  I personally and independently reviewed the scans and discussed the results with the patient today. Her scan showed no concerning findings for disease progression or metastasis.  I recommended for the patient to continue her current treatment with nivolumab every 4 weeks and she will proceed with cycle #33 today. I will see her back for follow-up visit in 4 weeks for evaluation before the next cycle of her treatment. For the cardiac arrhythmia, she will continue with her current medication under the care of Dr. [name]. The patient has question about the Covid vaccine and I recommended for her to take the vaccine when it becomes available for her. She was advised to call immediately if she has any concerning symptoms in the interval. The patient voices understanding of current disease status and treatment options and is in agreement with the current care plan. All questions were answered. The patient knows to call the clinic with any problems, questions or concerns. We  can certainly see the patient much sooner if necessary.  Disclaimer: This note was dictated with voice recognition software. Similar sounding words can inadvertently be transcribed and may not be corrected upon review.

## 2019-09-01 ENCOUNTER — Telehealth: Payer: Self-pay | Admitting: Internal Medicine

## 2019-09-01 NOTE — Telephone Encounter (Signed)
Scheduled per los. Called and left msg. Mailed prntout

## 2019-09-21 ENCOUNTER — Telehealth: Payer: Self-pay | Admitting: *Deleted

## 2019-09-21 ENCOUNTER — Telehealth: Payer: Self-pay | Admitting: Medical Oncology

## 2019-09-21 NOTE — Telephone Encounter (Signed)
Pt asking if she can take the covid vaccine the day after her treatment.I told her per Julien Nordmann it is okay to take the COVID vaccine.

## 2019-09-21 NOTE — Telephone Encounter (Signed)
I called to check on Nancy Hodges.  We spoke.  I am concerned about the bad weather coming and wanted to make sure she had necessary supplies.  She said she was ok and did not need anything. I asked her to call if needed.

## 2019-09-27 ENCOUNTER — Ambulatory Visit: Payer: Medicare Other

## 2019-09-27 ENCOUNTER — Telehealth: Payer: Self-pay

## 2019-09-27 ENCOUNTER — Inpatient Hospital Stay: Payer: Medicare Other

## 2019-09-27 ENCOUNTER — Telehealth: Payer: Self-pay | Admitting: Internal Medicine

## 2019-09-27 ENCOUNTER — Ambulatory Visit: Payer: Medicare Other | Admitting: Internal Medicine

## 2019-09-27 ENCOUNTER — Inpatient Hospital Stay: Payer: Medicare Other | Attending: Internal Medicine

## 2019-09-27 ENCOUNTER — Other Ambulatory Visit: Payer: Medicare Other

## 2019-09-27 ENCOUNTER — Other Ambulatory Visit: Payer: Self-pay

## 2019-09-27 ENCOUNTER — Inpatient Hospital Stay (HOSPITAL_BASED_OUTPATIENT_CLINIC_OR_DEPARTMENT_OTHER): Payer: Medicare Other | Admitting: Internal Medicine

## 2019-09-27 ENCOUNTER — Encounter: Payer: Self-pay | Admitting: Internal Medicine

## 2019-09-27 VITALS — BP 156/77 | HR 70 | Temp 98.5°F | Resp 18 | Ht 62.0 in | Wt 154.2 lb

## 2019-09-27 DIAGNOSIS — C3491 Malignant neoplasm of unspecified part of right bronchus or lung: Secondary | ICD-10-CM | POA: Diagnosis not present

## 2019-09-27 DIAGNOSIS — Z5112 Encounter for antineoplastic immunotherapy: Secondary | ICD-10-CM | POA: Diagnosis present

## 2019-09-27 DIAGNOSIS — Z79899 Other long term (current) drug therapy: Secondary | ICD-10-CM | POA: Diagnosis not present

## 2019-09-27 DIAGNOSIS — C773 Secondary and unspecified malignant neoplasm of axilla and upper limb lymph nodes: Secondary | ICD-10-CM | POA: Diagnosis present

## 2019-09-27 DIAGNOSIS — C3411 Malignant neoplasm of upper lobe, right bronchus or lung: Secondary | ICD-10-CM | POA: Diagnosis present

## 2019-09-27 DIAGNOSIS — E032 Hypothyroidism due to medicaments and other exogenous substances: Secondary | ICD-10-CM

## 2019-09-27 DIAGNOSIS — Z95828 Presence of other vascular implants and grafts: Secondary | ICD-10-CM

## 2019-09-27 LAB — CBC WITH DIFFERENTIAL (CANCER CENTER ONLY)
Abs Immature Granulocytes: 0.01 10*3/uL (ref 0.00–0.07)
Basophils Absolute: 0.1 10*3/uL (ref 0.0–0.1)
Basophils Relative: 1 %
Eosinophils Absolute: 0.1 10*3/uL (ref 0.0–0.5)
Eosinophils Relative: 2 %
HCT: 39.8 % (ref 36.0–46.0)
Hemoglobin: 13.8 g/dL (ref 12.0–15.0)
Immature Granulocytes: 0 %
Lymphocytes Relative: 20 %
Lymphs Abs: 1.1 10*3/uL (ref 0.7–4.0)
MCH: 33.2 pg (ref 26.0–34.0)
MCHC: 34.7 g/dL (ref 30.0–36.0)
MCV: 95.7 fL (ref 80.0–100.0)
Monocytes Absolute: 0.4 10*3/uL (ref 0.1–1.0)
Monocytes Relative: 7 %
Neutro Abs: 4 10*3/uL (ref 1.7–7.7)
Neutrophils Relative %: 70 %
Platelet Count: 187 10*3/uL (ref 150–400)
RBC: 4.16 MIL/uL (ref 3.87–5.11)
RDW: 12.8 % (ref 11.5–15.5)
WBC Count: 5.6 10*3/uL (ref 4.0–10.5)
nRBC: 0 % (ref 0.0–0.2)

## 2019-09-27 LAB — CMP (CANCER CENTER ONLY)
ALT: 36 U/L (ref 0–44)
AST: 35 U/L (ref 15–41)
Albumin: 3.7 g/dL (ref 3.5–5.0)
Alkaline Phosphatase: 99 U/L (ref 38–126)
Anion gap: 6 (ref 5–15)
BUN: 13 mg/dL (ref 8–23)
CO2: 29 mmol/L (ref 22–32)
Calcium: 9.2 mg/dL (ref 8.9–10.3)
Chloride: 105 mmol/L (ref 98–111)
Creatinine: 0.75 mg/dL (ref 0.44–1.00)
GFR, Est AFR Am: >60 mL/min (ref >60)
GFR, Estimated: >60 mL/min (ref >60)
Glucose, Bld: 200 mg/dL — ABNORMAL HIGH (ref 70–99)
Potassium: 4.1 mmol/L (ref 3.5–5.1)
Sodium: 140 mmol/L (ref 135–145)
Total Bilirubin: 1 mg/dL (ref 0.3–1.2)
Total Protein: 6.6 g/dL (ref 6.5–8.1)

## 2019-09-27 LAB — TSH: TSH: 2.169 u[IU]/mL (ref 0.308–3.960)

## 2019-09-27 MED ORDER — HEPARIN SOD (PORK) LOCK FLUSH 100 UNIT/ML IV SOLN
500.0000 [IU] | Freq: Once | INTRAVENOUS | Status: AC | PRN
  Administered 2019-09-27: 500 [IU]
  Filled 2019-09-27: qty 5

## 2019-09-27 MED ORDER — SODIUM CHLORIDE 0.9 % IV SOLN
Freq: Once | INTRAVENOUS | Status: AC
  Filled 2019-09-27: qty 250

## 2019-09-27 MED ORDER — SODIUM CHLORIDE 0.9% FLUSH
10.0000 mL | INTRAVENOUS | Status: DC | PRN
  Administered 2019-09-27: 10 mL
  Filled 2019-09-27: qty 10

## 2019-09-27 MED ORDER — SODIUM CHLORIDE 0.9% FLUSH
10.0000 mL | INTRAVENOUS | Status: DC | PRN
  Administered 2019-09-27: 10:00:00 10 mL via INTRAVENOUS
  Filled 2019-09-27: qty 10

## 2019-09-27 MED ORDER — SODIUM CHLORIDE 0.9 % IV SOLN
480.0000 mg | Freq: Once | INTRAVENOUS | Status: AC
  Administered 2019-09-27: 480 mg via INTRAVENOUS
  Filled 2019-09-27: qty 48

## 2019-09-27 NOTE — Telephone Encounter (Signed)
LVM for patient to call back regarding her early appointment time change due to transportation.

## 2019-09-27 NOTE — Telephone Encounter (Signed)
Scheduled appt per 2/23 los - gave patient AVS and calender per los.

## 2019-09-27 NOTE — Telephone Encounter (Signed)
Schedule message sent for Lab, flush, physician visit and infusion times on 10/25/19 to be moved to later in the morning (preferably from 0930). Patient can not make it for 0845 due to transportation.

## 2019-09-27 NOTE — Patient Instructions (Signed)
[facility] Discharge Instructions for Patients Receiving Chemotherapy  Today you received the following chemotherapy agents Opdivo  To help prevent nausea and vomiting after your treatment, we encourage you to take your nausea medication as directed   If you develop nausea and vomiting that is not controlled by your nausea medication, call the clinic.   BELOW ARE SYMPTOMS THAT SHOULD BE REPORTED IMMEDIATELY:  *FEVER GREATER THAN 100.5 F  *CHILLS WITH OR WITHOUT FEVER  NAUSEA AND VOMITING THAT IS NOT CONTROLLED WITH YOUR NAUSEA MEDICATION  *UNUSUAL SHORTNESS OF BREATH  *UNUSUAL BRUISING OR BLEEDING  TENDERNESS IN MOUTH AND THROAT WITH OR WITHOUT PRESENCE OF ULCERS  *URINARY PROBLEMS  *BOWEL PROBLEMS  UNUSUAL RASH Items with * indicate a potential emergency and should be followed up as soon as possible.  Feel free to call the clinic should you have any questions or concerns. The clinic phone number is [phone].  Please show the CHEMO ALERT CARD at check-in to the Emergency Department and triage nurse.   

## 2019-09-27 NOTE — Progress Notes (Signed)
[place] Telephone:([phone]) [phone]   Fax:(336) [phone]  OFFICE PROGRESS NOTE  Harris, [name], [name] [address] [place] [address]  DIAGNOSIS: Metastatic non-small cell lung cancer, adenocarcinoma. This was initially diagnosed as stage IB (T2a, N0, M0) in March of 2014, status post right upper lobectomy with lymph node dissection but the patient has evidence for disease recurrence in the right axilla status post resection.  PRIOR THERAPY:  1) Status post right axillary lymph node biopsy on 05/01/2014 under the care of Dr. Roxan Hockey. 2) Systemic chemotherapy with carboplatin for AUC of 5 and Alimta 500 mg/M2 every 3 weeks. First cycle 05/31/2014. Status post 6 cycles. Last dose was given 09/22/2014. 3) palliative radiotherapy to the subcarinal lymph node under the care of Dr. Sondra Come. 4) Nivolumab 240 mg IV every 2 weeks. First dose 12/23/2016. Status post 6 cycles.  CURRENT THERAPY:  Nivolumab 480 mg IV every 4 weeks. First dose 03/17/2017. Status post 33  cycles.  INTERVAL HISTORY: Nancy Hodges 74 y.o. female returns to the clinic today for follow-up visit.  The patient is feeling fine today with no concerning complaints except for occasional pain in the right shoulder area.  She denied having any current chest pain, shortness of breath, cough or hemoptysis.  She denied having any weight loss or night sweats.  She has no nausea, vomiting, diarrhea or constipation.  She has no headache or visual changes.  She continues to tolerate her treatment with immunotherapy fairly well.   MEDICAL HISTORY: Past Medical History:  Diagnosis Date  . Allergic rhinitis   . Anxiety   . Arthritis   . Asthma   . Atrial flutter ([place])   . Cholelithiases 09/26/2015  . Diverticulosis   . Encounter for antineoplastic immunotherapy 12/09/2016  . GERD (gastroesophageal reflux disease)   . H/O hiatal hernia   . History of blood transfusion   . History of chemotherapy   .  History of kidney stones   . History of radiation therapy   . Internal hemorrhoid   . Lung cancer ([place])    a. Stage IB non-small cell carcinoma, s/p R VATS, wedge resection, RU lobectomy 10/2012.  [name] Persistent atrial fibrillation ([facility])    a. anticoagulated with Xarelto  . Pneumonia 06/2016   [facility]  . Presence of permanent cardiac pacemaker   . Pulmonary nodule   . Tachycardia-bradycardia syndrome ([place])    a. 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    ALLERGIES:  is allergic to chlorpheniramine-dm; prednisone; septra [sulfamethoxazole-trimethoprim]; vioxx [rofecoxib]; and penicillins.  MEDICATIONS:  Current Outpatient Medications  Medication Sig Dispense Refill  . acetaminophen (TYLENOL) 500 MG tablet Take 500 mg by mouth 3 (three) times daily as needed for moderate pain or fever.     [name] albuterol (PROVENTIL HFA;VENTOLIN HFA) 108 (90 BASE) MCG/ACT inhaler Inhale 2 puffs into the lungs every 6 (six) hours as needed for wheezing or shortness of breath.     [name] azelastine (ASTELIN) 0.1 % nasal spray Place 1 spray into both nostrils 2 (two) times daily. Use in each nostril as directed    . calcium carbonate (TUMS - DOSED IN MG ELEMENTAL CALCIUM) 500 MG chewable tablet Chew 1 tablet by mouth 2 (two) times daily as needed for indigestion or heartburn.     . diltiazem (CARTIA XT) 240 MG 24 hr capsule Take 1 capsule (240 mg total) by mouth daily. 90 capsule 2  . dimenhyDRINATE (DRAMAMINE) 50 MG tablet Take  12.5 mg by mouth every 8 (eight) hours as needed for dizziness.    [name] ipratropium-albuterol (DUONEB) 0.5-2.5 (3) MG/3ML SOLN Take 3 mLs by nebulization every 6 (six) hours as needed (shortness of breath or wheezing).     [name] lidocaine-prilocaine (EMLA) cream APPLY TOPICALLY TO TO THE AFFECTED AREA AS NEEDED 30 g 4  . loratadine (CLARITIN) 10 MG tablet Take 10 mg by mouth daily.    [name] OVER THE COUNTER MEDICATION Take 5 mLs by mouth daily as needed (cough). H[employer]'s kids cough syrup    .  Polyvinyl Alcohol-Povidone (REFRESH OP) Place 1 drop into both eyes daily as needed (dry eyes).    [name] tablet TAKE 1 TABLET(20 MG) BY MOUTH DAILY WITH SUPPER 30 tablet 5   No current facility-administered medications for this visit.    SURGICAL HISTORY:  Past Surgical History:  Procedure Laterality Date  . CARDIOVERSION N/A 02/19/2018   Procedure: CARDIOVERSION;  Surgeon: Sanda Klein, MD;  Location: [facility] ENDOSCOPY;  Service: Cardiovascular;  Laterality: N/A;  . CARDIOVERSION N/A 04/30/2018   Procedure: CARDIOVERSION;  Surgeon: Thayer Headings, MD;  Location: [facility] ENDOSCOPY;  Service: Cardiovascular;  Laterality: N/A;  . CATARACT EXTRACTION W/PHACO Right 08/14/2016   Procedure: CATARACT EXTRACTION PHACO AND INTRAOCULAR LENS PLACEMENT RIGHT EYE CDE 10.53;  Surgeon: Tonny Branch, MD;  Location: [facility];  Service: Ophthalmology;  Laterality: Right;  right  . CATARACT EXTRACTION W/PHACO Left 09/25/2016   Procedure: CATARACT EXTRACTION PHACO AND INTRAOCULAR LENS PLACEMENT (IOC);  Surgeon: Tonny Branch, MD;  Location: [facility];  Service: Ophthalmology;  Laterality: Left;  left CDE 8.81  . COLONOSCOPY     [facility]: cattered sigmoid diverticula, internal hemorrhoids, sessile polyp in ascending and mid transverse colon (tubular adenoma).   . colonoscopy with polypectomy  2015  . ESOPHAGOGASTRODUODENOSCOPY     [facility]: bile reflux in stomach, mild gastritis, hiatal hernia  . ESOPHAGOGASTRODUODENOSCOPY N/A 01/31/2016   Procedure: ESOPHAGOGASTRODUODENOSCOPY (EGD);  Surgeon: [name], MD;  Location: [facility];  Service: Endoscopy;  Laterality: N/A;  1200  . IR FLUORO GUIDE PORT INSERTION RIGHT  06/30/2017  . IR US GUIDE VASC ACCESS RIGHT  06/30/2017  . LOBECTOMY Right 10/27/2012   Procedure: LOBECTOMY;  Surgeon: [name], MD;  Location: [facility];  Service: Thoracic;  Laterality: Right;   RIGHT UPPER LOBECTOMY & Node Dissection  . LYMPH NODE BIOPSY Right 05/01/2014   Procedure:  LYMPH NODE BIOPSY, Right Axillary;  Surgeon: [name], MD;  Location: [facility];  Service: Thoracic;  Laterality: Right;  . PACEMAKER IMPLANT N/A 10/30/2017   SJM Assurity MRI PPM implanted by Dr [name] once leadless pacemaker battery depleted  . PARTIAL HYSTERECTOMY    . PERMANENT PACEMAKER INSERTION N/A 09/14/2012   Nanostim (SJM) leadless pacemaker (LEADLESS II STUDY PATIENT) implanted by Dr [name], no longer functioning, device abandoned.  [name] VIDEO ASSISTED THORACOSCOPY (VATS)/WEDGE RESECTION Right 10/27/2012   Procedure: VIDEO ASSISTED THORACOSCOPY (VATS),RGHT UPPER LOBE LUNG WEDGE RESECTION;  Surgeon: [name], MD;  Location: [facility];  Service: Thoracic;  Laterality: Right;    REVIEW OF SYSTEMS:  A comprehensive review of systems was negative except for: Musculoskeletal: positive for arthralgias   PHYSICAL EXAMINATION: General appearance: alert, cooperative and no distress Head: Normocephalic, without obvious abnormality, atraumatic Neck: no adenopathy, no JVD, supple, symmetrical, trachea midline and thyroid not enlarged, symmetric, no tenderness/mass/nodules Lymph nodes: Cervical, supraclavicular, and axillary nodes normal. Resp: clear to auscultation bilaterally Back: symmetric, no curvature.  ROM normal. No CVA tenderness. Cardio: regular rate and rhythm, S1, S2 normal, no murmur, click, rub or gallop GI: soft, non-tender; bowel sounds normal; no masses,  no organomegaly Extremities: extremities normal, atraumatic, no cyanosis or edema  ECOG PERFORMANCE STATUS: 1 - Symptomatic but completely ambulatory  Blood pressure (!) 156/77, pulse 70, temperature 98.5 F (36.9 C), temperature source Temporal, resp. rate 18, height 5\' 2"  (1.575 m), weight 154 lb 3.2 oz (69.9 kg), SpO2 100 %.  LABORATORY DATA: Lab Results  Component Value Date   WBC 5.6 09/27/2019   HGB 13.8 09/27/2019   HCT 39.8 09/27/2019   MCV 95.7 09/27/2019   PLT 187 09/27/2019      Chemistry       Component Value Date/Time   NA 141 08/30/2019 0931   NA 139 08/05/2017 0902   K 4.3 08/30/2019 0931   K 4.1 08/05/2017 0902   CL 105 08/30/2019 0931   CO2 28 08/30/2019 0931   CO2 28 08/05/2017 0902   BUN 13 08/30/2019 0931   BUN 10.1 08/05/2017 0902   CREATININE 0.76 08/30/2019 0931   CREATININE 0.8 08/05/2017 0902   GLU 134 (H) 02/18/2017 1410      Component Value Date/Time   CALCIUM 9.1 08/30/2019 0931   CALCIUM 9.5 08/05/2017 0902   ALKPHOS 107 08/30/2019 0931   ALKPHOS 111 08/05/2017 0902   AST 37 08/30/2019 0931   AST 33 08/05/2017 0902   ALT 34 08/30/2019 0931   ALT 34 08/05/2017 0902   BILITOT 1.0 08/30/2019 0931   BILITOT 1.39 (H) 08/05/2017 0902       RADIOGRAPHIC STUDIES: No results found. ASSESSMENT AND PLAN:  This is a very pleasant 74 years old white female with metastatic non-small cell lung cancer, adenocarcinoma with no actionable mutations status post systemic chemotherapy with carboplatin and Alimta and she is currently undergoing treatment with second line immunotherapy with Nivolumab status post 6 cycles. This was followed by immunotherapy with Nivolumab 480 mg IV every 4 weeks status post 33 cycles. She continues to tolerate her treatment well with no adverse effects. I recommended for her to proceed with cycle #34 today as planned. I will see her back for follow-up visit in 4 weeks for evaluation before the next cycle of her treatment. She is scheduled to receive her first dose of the Covid vaccine tomorrow. For the cardiac arrhythmia, she will continue with her current medication under the care of Dr. [name]. The patient was advised to call immediately if she has any concerning symptoms in the interval. The patient voices understanding of current disease status and treatment options and is in agreement with the current care plan. All questions were answered. The patient knows to call the clinic with any problems, questions or concerns. We can  certainly see the patient much sooner if necessary.  Disclaimer: This note was dictated with voice recognition software. Similar sounding words can inadvertently be transcribed and may not be corrected upon review.

## 2019-09-28 ENCOUNTER — Telehealth: Payer: Self-pay | Admitting: Internal Medicine

## 2019-09-28 NOTE — Telephone Encounter (Signed)
R/s appt per 2/23 sch message - pt aware of new appt date and time

## 2019-09-30 IMAGING — CT CT CHEST WITH CONTRAST
2 of 5 series · 12 of 36 positions shown, 15 images · IV contrast (APPLIED)
Comparison: 11/22/2018 CT chest, abdomen and pelvis.

CLINICAL DATA: Recurrent right upper lobe lung adenocarcinoma
originally diagnosed 0431 with right axillary and subcarinal
recurrence. Ongoing immunotherapy. Restaging.

EXAM:
CT CHEST, ABDOMEN, AND PELVIS WITH CONTRAST
TECHNIQUE: Multidetector CT imaging of the chest, abdomen and pelvis was
performed following the standard protocol during bolus
administration of intravenous contrast.
CONTRAST:  100mL OMNIPAQUE IOHEXOL 300 MG/ML  SOLN

[Series 2: cap with · axial · 0.82mm/px · z∈[-571,-81]mm · 9 of 120 slices shown, 12 images]
[im 11/120  mediastinal]
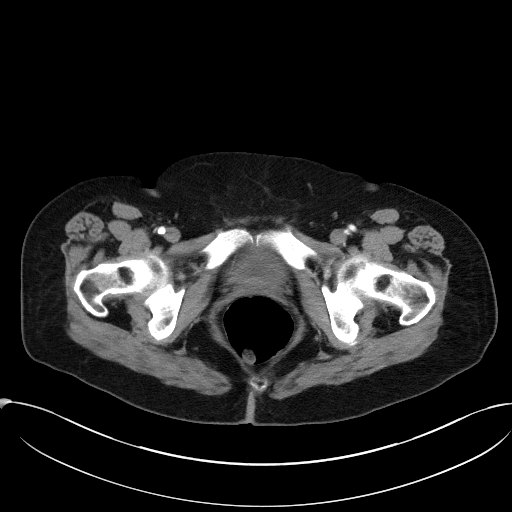
[im 11/120  lung]
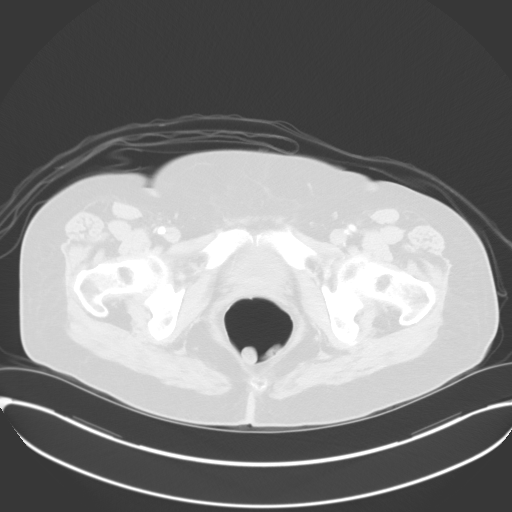
[im 22/120  lung]
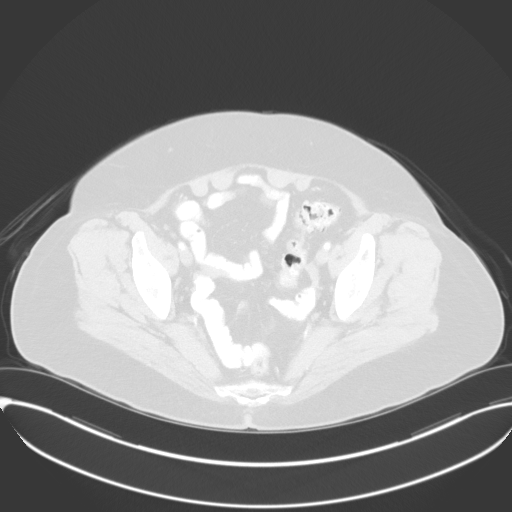
[im 33/120  lung]
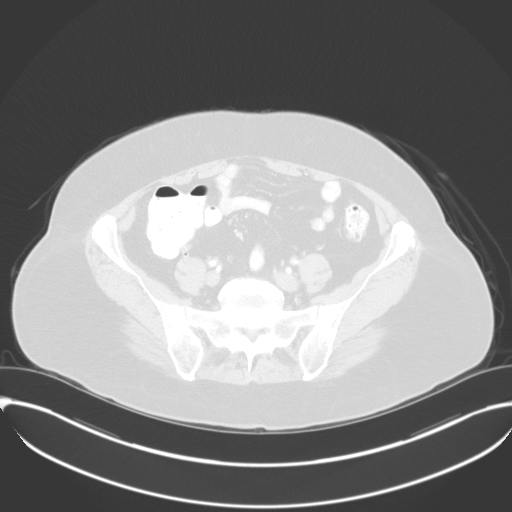
[im 44/120  lung]
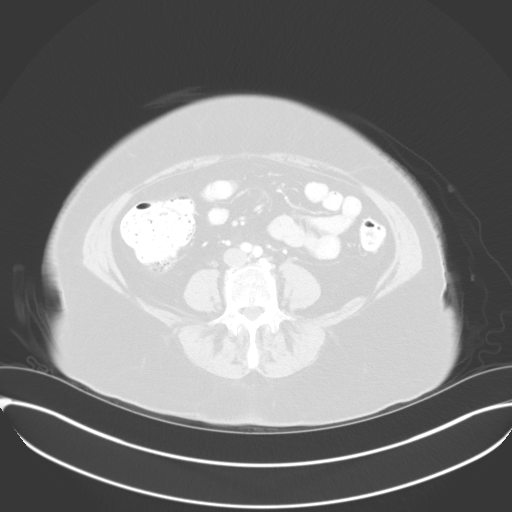
[im 65/120  mediastinal]
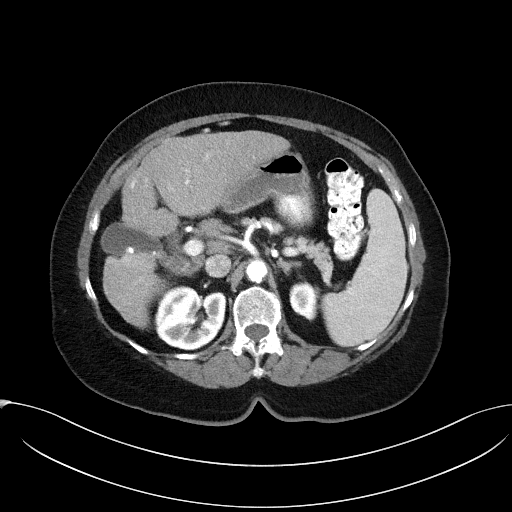
[im 65/120  lung]
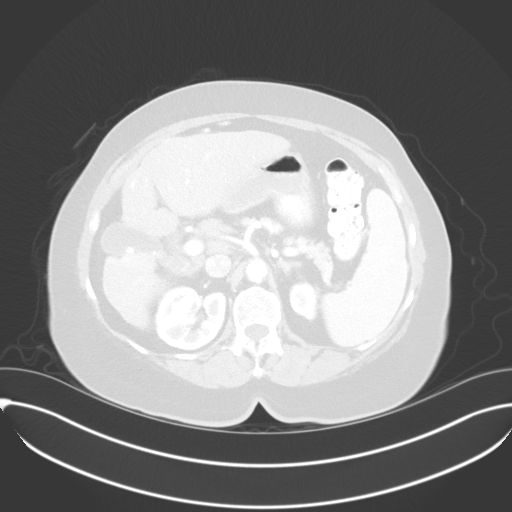
[im 76/120  lung]
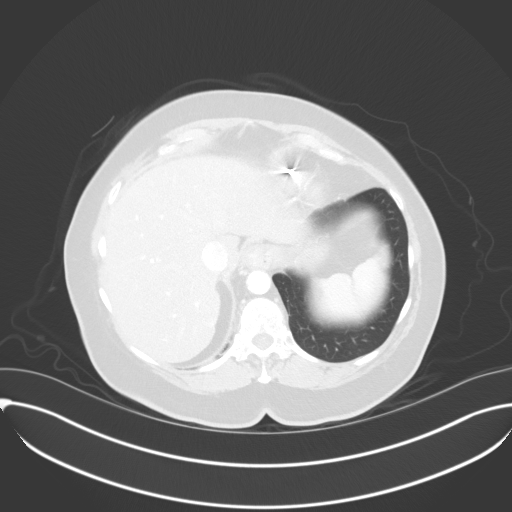
[im 87/120  lung]
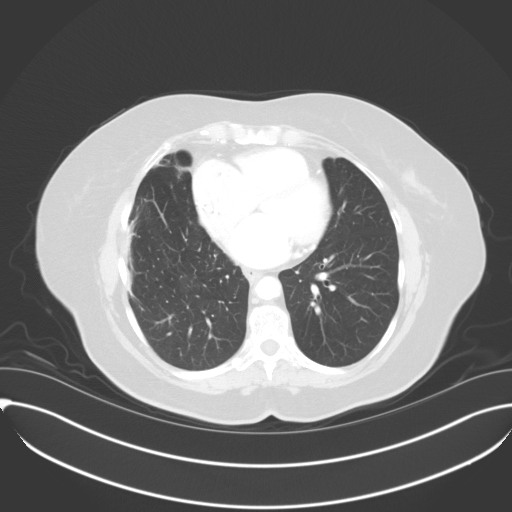
[im 98/120  lung]
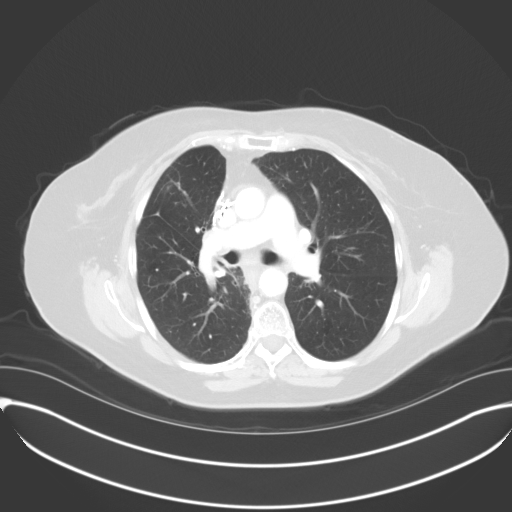
[im 109/120  mediastinal]
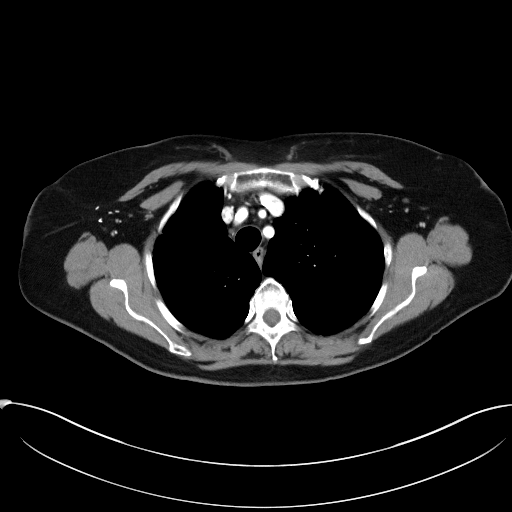
[im 109/120  lung]
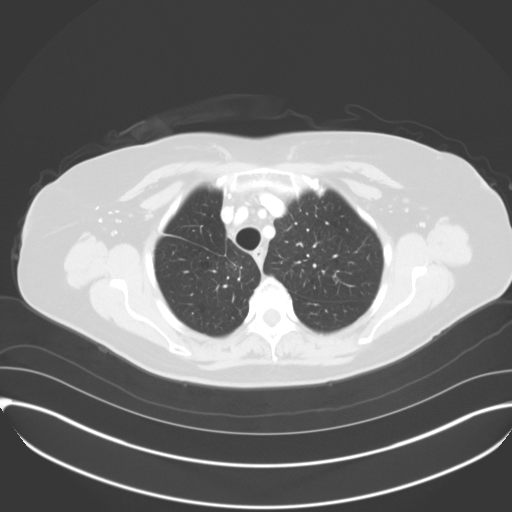

[Series 5: coronals · coronal · 0.67mm/px · 3 of 137 slices shown]
[im 28/137  lung]
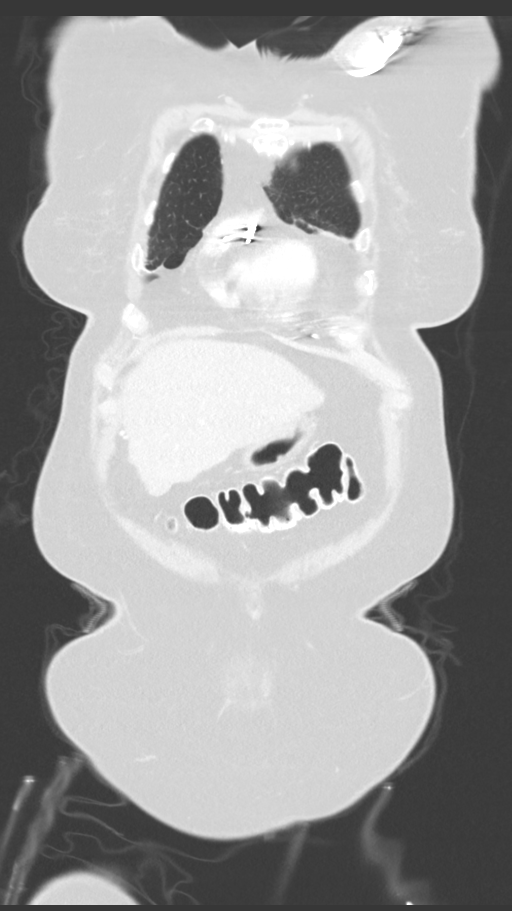
[im 55/137  lung]
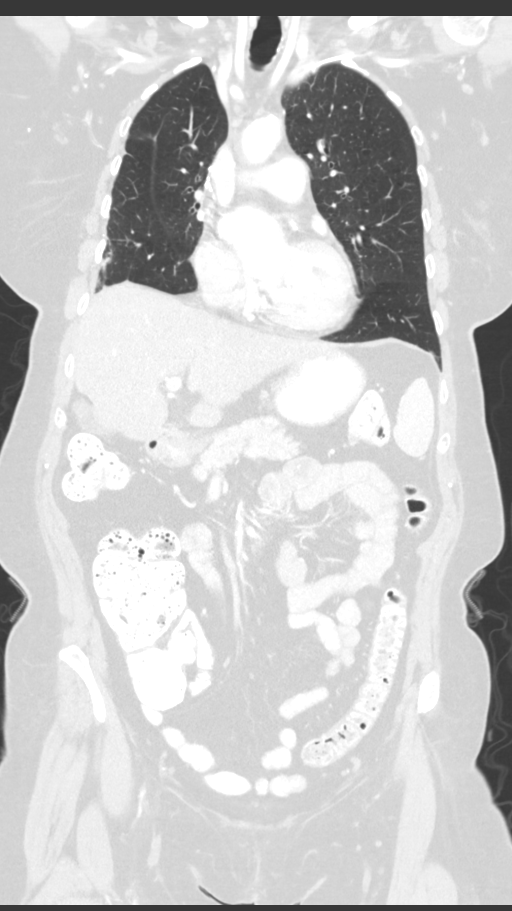
[im 82/137  lung]
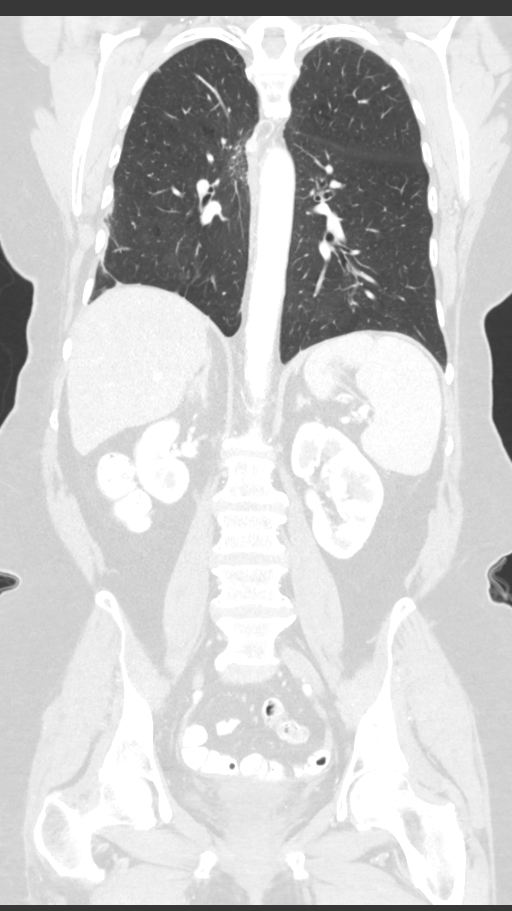

[12 of 36 positions shown; findings below may reference images not displayed]

FINDINGS: CT CHEST FINDINGS

Cardiovascular: Top-normal heart size. Two lead left subclavian ICD
is noted with lead tips in right atrium and right ventricular apex.
Right internal jugular Port-A-Cath terminates at the cavoatrial
junction. Left anterior descending and left circumflex coronary
atherosclerosis. No significant pericardial effusion/thickening.
Atherosclerotic nonaneurysmal thoracic aorta. Normal caliber
pulmonary arteries. No central pulmonary emboli.

Mediastinum/Nodes: No discrete thyroid nodules. Unremarkable
esophagus. Surgical clips are again noted in the right axilla. No
pathologically enlarged axillary, mediastinal or hilar lymph nodes.

Lungs/Pleura: No pneumothorax. No pleural effusion. Status post
right upper lobectomy. Mild centrilobular emphysema. Stable sharply
marginated mild paramediastinal consolidation in the right lower
lobe, compatible with mild radiation fibrosis. No acute
consolidative airspace disease, lung masses or significant pulmonary
nodules. Stable parenchymal banding in the peripheral right lung
base compatible with nonspecific scarring.

Musculoskeletal: No aggressive appearing focal osseous lesions. Mild
thoracic spondylosis.

CT ABDOMEN PELVIS FINDINGS

Hepatobiliary: Diffusely mildly irregular liver surface with
relative hypertrophy of the lateral segment left liver lobe,
compatible with hepatic cirrhosis. No liver masses. Cholelithiasis.
No biliary ductal dilatation.

Pancreas: Normal, with no mass or duct dilation.

Spleen: Normal size spleen. Low-attenuation 1.1 cm inferior splenic
lesion is stable since at least 4303 CT, considered benign. No new
splenic lesions.

Adrenals/Urinary Tract: Normal adrenals. No hydronephrosis. Stable
scattered mild renal cortical scarring in both kidneys. No renal
masses. Normal bladder.

Stomach/Bowel: Small hiatal hernia. Otherwise normal nondistended
stomach. Normal caliber small bowel with no small bowel wall
thickening. Normal appendix. Moderate left colonic diverticulosis,
with no large bowel wall thickening or significant pericolonic fat
stranding. Oral contrast transits to the left colon.

Vascular/Lymphatic: Atherosclerotic nonaneurysmal abdominal aorta.
Patent portal, splenic, hepatic and renal veins. Stable small
varices anterior to the left liver lobe. No pathologically enlarged
lymph nodes in the abdomen or pelvis.

Reproductive: Status post hysterectomy, with no abnormal findings at
the vaginal cuff. No adnexal mass.

Other: No pneumoperitoneum, ascites or focal fluid collection.

Musculoskeletal: No aggressive appearing focal osseous lesions. Mild
lumbar spondylosis.
IMPRESSION: 1. No evidence of local tumor recurrence status post right upper
lobectomy.
2. No findings of recurrent metastatic disease in the chest, abdomen
or pelvis.
3. Chronic findings include: Aortic Atherosclerosis (DAQWB-HSP.P)
and Emphysema (DAQWB-5ZE.A). Hepatic cirrhosis. Cholelithiasis.
Small hiatal hernia. Moderate left colonic diverticulosis.

## 2019-10-16 ENCOUNTER — Other Ambulatory Visit: Payer: Self-pay | Admitting: Internal Medicine

## 2019-10-17 NOTE — Telephone Encounter (Signed)
Prescription refill request for Xarelto received.   Last office visit: 08/12/2019, [place] Weight: 69.9 kg Age: 74 y.o. Scr: 0.75, 09/27/2019 CrCl: 71.52 ml/min   Prescription refill sent.

## 2019-10-19 ENCOUNTER — Other Ambulatory Visit: Payer: Self-pay

## 2019-10-19 MED ORDER — RIVAROXABAN 20 MG PO TABS: ORAL_TABLET | ORAL | 10 refills | Status: DC

## 2019-10-19 NOTE — Telephone Encounter (Signed)
Xarelto 20mg  refill request received. Pt is 74 years old, weight-69.9 kg, Crea-0.75 on 09/27/2019, last seen by Dr. [name] on 08/12/2019, Diagnosis-Afib, CrCl-71.82ml/min; Dose is appropriate based on dosing criteria. Will send in refill to requested pharmacy.

## 2019-10-24 NOTE — Progress Notes (Signed)
[facility] [facility] OFFICE PROGRESS NOTE  [name], [name] [address] [place] [address]  DIAGNOSIS: Metastatic non-small cell lung cancer, adenocarcinoma. This was initially diagnosed as stage IB (T2a, N0, M0) in March of 2014, status post right upper lobectomy with lymph node dissection but the patient has evidence for disease recurrence in the right axilla status post resection.  PRIOR THERAPY: 1) Status post right axillary lymph node biopsy on 05/01/2014 under the care of Dr. Roxan Hockey. 2) Systemic chemotherapy with carboplatin for AUC of 5 and Alimta 500 mg/M2 every 3 weeks. First cycle 05/31/2014. Status post 6 cycles. Last dose was given 09/22/2014. 3) palliative radiotherapy to the subcarinal lymph node under the care of Dr. Sondra Come. 4) Nivolumab 240 mg IV every 2 weeks. First dose 12/23/2016. Status post 6 cycles.  CURRENT THERAPY: Nivolumab 480 mg IV every 4 weeks. First dose 03/17/2017. Status post 34 cycles.  INTERVAL HISTORY: Nancy Hodges 74 y.o. female returns to the clinic for a follow up visit. The patient is feeling fairly well today without any concerning complaints. She states that everything is "the same". The patient continues to tolerate treatment with nivolumab well without any adverse effects. Denies any fever, chills, night sweats, or weight loss. Denies any chest pain, shortness of breath, cough, or hemoptysis. Denies any nausea, vomiting, diarrhea, or constipation. Denies any headache or visual changes. Denies any rashes or skin changes. The patient is here today for evaluation prior to starting cycle # 35  MEDICAL HISTORY: Past Medical History:  Diagnosis Date  . Allergic rhinitis   . Anxiety   . Arthritis   . Asthma   . Atrial flutter ([place])   . Cholelithiases 09/26/2015  . Diverticulosis   . Encounter for antineoplastic immunotherapy 12/09/2016  . GERD (gastroesophageal reflux disease)   . H/O hiatal hernia   . History of blood  transfusion   . History of chemotherapy   . History of kidney stones   . History of radiation therapy   . Internal hemorrhoid   . Lung cancer ([place])    a. Stage IB non-small cell carcinoma, s/p R VATS, wedge resection, RU lobectomy 10/2012.  [name] Persistent atrial fibrillation ([facility])    a. anticoagulated with Xarelto  . Pneumonia 06/2016   [facility]  . Presence of permanent cardiac pacemaker   . Pulmonary nodule   . Tachycardia-bradycardia syndrome ([place])    a. 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    ALLERGIES:  is allergic to chlorpheniramine-dm; prednisone; septra [sulfamethoxazole-trimethoprim]; vioxx [rofecoxib]; and penicillins.  MEDICATIONS:  Current Outpatient Medications  Medication Sig Dispense Refill  . acetaminophen (TYLENOL) 500 MG tablet Take 500 mg by mouth 3 (three) times daily as needed for moderate pain or fever.     [name] albuterol (PROVENTIL HFA;VENTOLIN HFA) 108 (90 BASE) MCG/ACT inhaler Inhale 2 puffs into the lungs every 6 (six) hours as needed for wheezing or shortness of breath.     [name] azelastine (ASTELIN) 0.1 % nasal spray Place 1 spray into both nostrils 2 (two) times daily. Use in each nostril as directed    . calcium carbonate (TUMS - DOSED IN MG ELEMENTAL CALCIUM) 500 MG chewable tablet Chew 1 tablet by mouth 2 (two) times daily as needed for indigestion or heartburn.     . diltiazem (CARTIA XT) 240 MG 24 hr capsule Take 1 capsule (240 mg total) by mouth daily. 90 capsule 2  . dimenhyDRINATE (DRAMAMINE) 50 MG tablet Take 12.5 mg by mouth  every 8 (eight) hours as needed for dizziness.    [name] ipratropium-albuterol (DUONEB) 0.5-2.5 (3) MG/3ML SOLN Take 3 mLs by nebulization every 6 (six) hours as needed (shortness of breath or wheezing).     [name] lidocaine-prilocaine (EMLA) cream APPLY TOPICALLY TO TO THE AFFECTED AREA AS NEEDED 30 g 4  . loratadine (CLARITIN) 10 MG tablet Take 10 mg by mouth daily.    [name] OVER THE COUNTER MEDICATION Take 5 mLs by mouth daily as  needed (cough). [employer] kids cough syrup    . Polyvinyl Alcohol-Povidone (REFRESH OP) Place 1 drop into both eyes daily as needed (dry eyes).    . rivaroxaban (XARELTO) 20 MG TABS tablet TAKE 1 TABLET(20 MG) BY MOUTH DAILY WITH SUPPER 30 tablet 10   No current facility-administered medications for this visit.   Facility-Administered Medications Ordered in Other Visits  Medication Dose Route Frequency Provider Last Rate Last Admin  . sodium chloride flush (NS) 0.9 % injection 10 mL  10 mL Intracatheter PRN Curt Bears, MD   10 mL at 10/25/19 1243    SURGICAL HISTORY:  Past Surgical History:  Procedure Laterality Date  . CARDIOVERSION N/A 02/19/2018   Procedure: CARDIOVERSION;  Surgeon: Sanda Klein, MD;  Location: [facility] ENDOSCOPY;  Service: Cardiovascular;  Laterality: N/A;  . CARDIOVERSION N/A 04/30/2018   Procedure: CARDIOVERSION;  Surgeon: Thayer Headings, MD;  Location: [facility] ENDOSCOPY;  Service: Cardiovascular;  Laterality: N/A;  . CATARACT EXTRACTION W/PHACO Right 08/14/2016   Procedure: CATARACT EXTRACTION PHACO AND INTRAOCULAR LENS PLACEMENT RIGHT EYE CDE 10.53;  Surgeon: Tonny Branch, MD;  Location: [facility] ORS;  Service: Ophthalmology;  Laterality: Right;  right  . CATARACT EXTRACTION W/PHACO Left 09/25/2016   Procedure: CATARACT EXTRACTION PHACO AND INTRAOCULAR LENS PLACEMENT (IOC);  Surgeon: Tonny Branch, MD;  Location: [facility] ORS;  Service: Ophthalmology;  Laterality: Left;  left CDE 8.81  . COLONOSCOPY     [facility]: cattered sigmoid diverticula, internal hemorrhoids, sessile polyp in ascending and mid transverse colon (tubular adenoma).   . colonoscopy with polypectomy  2015  . ESOPHAGOGASTRODUODENOSCOPY     [facility]: bile reflux in stomach, mild gastritis, hiatal hernia  . ESOPHAGOGASTRODUODENOSCOPY N/A 01/31/2016   Procedure: ESOPHAGOGASTRODUODENOSCOPY (EGD);  Surgeon: [name], MD;  Location: [facility];  Service: Endoscopy;  Laterality: N/A;  1200  . IR FLUORO GUIDE PORT  INSERTION RIGHT  06/30/2017  . IR US GUIDE VASC ACCESS RIGHT  06/30/2017  . LOBECTOMY Right 10/27/2012   Procedure: LOBECTOMY;  Surgeon: [name], MD;  Location: [facility];  Service: Thoracic;  Laterality: Right;   RIGHT UPPER LOBECTOMY & Node Dissection  . LYMPH NODE BIOPSY Right 05/01/2014   Procedure: LYMPH NODE BIOPSY, Right Axillary;  Surgeon: [name], MD;  Location: [facility];  Service: Thoracic;  Laterality: Right;  . PACEMAKER IMPLANT N/A 10/30/2017   SJM Assurity MRI PPM implanted by Dr [name] once leadless pacemaker battery depleted  . PARTIAL HYSTERECTOMY    . PERMANENT PACEMAKER INSERTION N/A 09/14/2012   Nanostim (SJM) leadless pacemaker (LEADLESS II STUDY PATIENT) implanted by Dr [name], no longer functioning, device abandoned.  [name] VIDEO ASSISTED THORACOSCOPY (VATS)/WEDGE RESECTION Right 10/27/2012   Procedure: VIDEO ASSISTED THORACOSCOPY (VATS),RGHT UPPER LOBE LUNG WEDGE RESECTION;  Surgeon: [name], MD;  Location: [facility];  Service: Thoracic;  Laterality: Right;    REVIEW OF SYSTEMS:   Review of Systems  Constitutional: Negative for appetite change, chills, fatigue, fever and unexpected weight change.  HENT: Negative for mouth  sores, nosebleeds, sore throat and trouble swallowing.   Eyes: Negative for eye problems and icterus.  Respiratory: Negative for cough, hemoptysis, shortness of breath and wheezing.  Cardiovascular: Negative for chest pain and leg swelling.  Gastrointestinal: Negative for abdominal pain, constipation, diarrhea, nausea and vomiting.  Genitourinary: Negative for bladder incontinence, difficulty urinating, dysuria, frequency and hematuria.   Musculoskeletal: Negative for back pain, gait problem, neck pain and neck stiffness.  Skin: Negative for itching and rash.  Neurological: Negative for dizziness, extremity weakness, gait problem, headaches, light-headedness and seizures.  Hematological: Negative for adenopathy. Does not  bruise/bleed easily.  Psychiatric/Behavioral: Negative for confusion, depression and sleep disturbance. The patient is not nervous/anxious.     PHYSICAL EXAMINATION:  Blood pressure (!) 143/69, pulse 67, temperature 98.3 F (36.8 C), temperature source Temporal, resp. rate 20, height 5\' 2"  (1.575 m), weight 153 lb 14.4 oz (69.8 kg), SpO2 97 %.  ECOG PERFORMANCE STATUS: 1 - Symptomatic but completely ambulatory  Physical Exam  Constitutional: Oriented to person, place, and time and well-developed, well-nourished, and in no distress.   HENT:  Head: Normocephalic and atraumatic.  Mouth/Throat: Oropharynx is clear and moist. No oropharyngeal exudate.  Eyes: Conjunctivae are normal. Right eye exhibits no discharge. Left eye exhibits no discharge. No scleral icterus.  Neck: Normal range of motion. Neck supple.  Cardiovascular: Normal rate, irregular rhythm, normal heart sounds and intact distal pulses.   Pulmonary/Chest: Effort normal and breath sounds normal. No respiratory distress. No wheezes. No rales.  Abdominal: Soft. Bowel sounds are normal. Exhibits no distension and no mass. There is no tenderness.  Musculoskeletal: Normal range of motion. Exhibits no edema.  Lymphadenopathy:    No cervical adenopathy.  Neurological: Alert and oriented to person, place, and time. Exhibits normal muscle tone. Gait normal. Coordination normal.  Skin: Skin is warm and dry. No rash noted. Not diaphoretic. No erythema. No pallor.  Psychiatric: Mood, memory and judgment normal.  Vitals reviewed.  LABORATORY DATA: Lab Results  Component Value Date   WBC 6.3 10/25/2019   HGB 14.1 10/25/2019   HCT 40.3 10/25/2019   MCV 95.7 10/25/2019   PLT 203 10/25/2019      Chemistry      Component Value Date/Time   NA 140 10/25/2019 1010   NA 139 08/05/2017 0902   K 4.6 10/25/2019 1010   K 4.1 08/05/2017 0902   CL 106 10/25/2019 1010   CO2 28 10/25/2019 1010   CO2 28 08/05/2017 0902   BUN 11 10/25/2019  1010   BUN 10.1 08/05/2017 0902   CREATININE 0.74 10/25/2019 1010   CREATININE 0.8 08/05/2017 0902   GLU 134 (H) 02/18/2017 1410      Component Value Date/Time   CALCIUM 9.4 10/25/2019 1010   CALCIUM 9.5 08/05/2017 0902   ALKPHOS 106 10/25/2019 1010   ALKPHOS 111 08/05/2017 0902   AST 32 10/25/2019 1010   AST 33 08/05/2017 0902   ALT 29 10/25/2019 1010   ALT 34 08/05/2017 0902   BILITOT 1.2 10/25/2019 1010   BILITOT 1.39 (H) 08/05/2017 0902       RADIOGRAPHIC STUDIES:  No results found.   ASSESSMENT/PLAN:  This is a very pleasant 74 year old Caucasian female with metastatic non-small cell lung cancer, adenocarcinoma. She was first diagnosed as a stage IB (T2a, N0, M0) in March 2014. She presented with a right upper lobe lesion status post right upper lobectomy and lymph node dissection. She had evidence if disease recurrence in the right axillar  status post resection. She has no actionable mutations.   She is status post systemic chemotherapy with carboplatin and alimta. Status post 6 cycles.   She is currently undergoing immunotherapy with nivolumab. She is status post 6 cycles with 240 mg IV every 2 weeks.  She is currently undergoing immunotherapy with nivolumab 480 mg IV every 4 weeks.  She is status post 34 cycles.  She has been tolerating it well without any adverse side effects.   Labs were reviewed.  We recommend that she proceed with cycle #35 today scheduled.  I will arrange for restaging CT scan of the chest, abdomen, and pelvis prior to her next cycle of immunotherapy in 4 weeks.  We will see her back for follow-up visit in 4 weeks for evaluation and to review her scan results before starting cycle #36.  The patient was advised to call immediately if she has any concerning symptoms in the interval. The patient voices understanding of current disease status and treatment options and is in agreement with the current care plan. All questions were answered. The  patient knows to call the clinic with any problems, questions or concerns. We can certainly see the patient much sooner if necessary      Orders Placed This Encounter  Procedures  . CT Chest W Contrast    Standing Status:   Future    Standing Expiration Date:   10/24/2020    Order Specific Question:   ** REASON FOR EXAM (FREE TEXT)    Answer:   Restaging Lung Cancer    Order Specific Question:   If indicated for the ordered procedure, I authorize the administration of contrast media per Radiology protocol    Answer:   Yes    Order Specific Question:   Preferred imaging location?    Answer:   [facility]    Order Specific Question:   Radiology Contrast Protocol - do NOT remove file path    Answer:   \\[email or web address]  . CT Abdomen Pelvis W Contrast    Standing Status:   Future    Standing Expiration Date:   10/24/2020    Order Specific Question:   ** REASON FOR EXAM (FREE TEXT)    Answer:   Restaging Lung Cancer    Order Specific Question:   If indicated for the ordered procedure, I authorize the administration of contrast media per Radiology protocol    Answer:   Yes    Order Specific Question:   Preferred imaging location?    Answer:   [facility]    Order Specific Question:   Is Oral Contrast requested for this exam?    Answer:   Yes, Per Radiology protocol    Order Specific Question:   Radiology Contrast Protocol - do NOT remove file path    Answer:   \\[email or web address]     [place], PA-C 10/25/19

## 2019-10-25 ENCOUNTER — Ambulatory Visit: Payer: Medicare Other | Admitting: Physician Assistant

## 2019-10-25 ENCOUNTER — Inpatient Hospital Stay: Payer: Medicare Other | Attending: Internal Medicine

## 2019-10-25 ENCOUNTER — Other Ambulatory Visit: Payer: Medicare Other

## 2019-10-25 ENCOUNTER — Inpatient Hospital Stay: Payer: Medicare Other

## 2019-10-25 ENCOUNTER — Inpatient Hospital Stay (HOSPITAL_BASED_OUTPATIENT_CLINIC_OR_DEPARTMENT_OTHER): Payer: Medicare Other | Admitting: Physician Assistant

## 2019-10-25 ENCOUNTER — Other Ambulatory Visit: Payer: Self-pay

## 2019-10-25 ENCOUNTER — Ambulatory Visit: Payer: Medicare Other

## 2019-10-25 VITALS — BP 143/69 | HR 67 | Temp 98.3°F | Resp 20 | Ht 62.0 in | Wt 153.9 lb

## 2019-10-25 DIAGNOSIS — Z5112 Encounter for antineoplastic immunotherapy: Secondary | ICD-10-CM | POA: Diagnosis present

## 2019-10-25 DIAGNOSIS — C3491 Malignant neoplasm of unspecified part of right bronchus or lung: Secondary | ICD-10-CM

## 2019-10-25 DIAGNOSIS — E032 Hypothyroidism due to medicaments and other exogenous substances: Secondary | ICD-10-CM

## 2019-10-25 DIAGNOSIS — C3411 Malignant neoplasm of upper lobe, right bronchus or lung: Secondary | ICD-10-CM | POA: Diagnosis present

## 2019-10-25 DIAGNOSIS — C773 Secondary and unspecified malignant neoplasm of axilla and upper limb lymph nodes: Secondary | ICD-10-CM | POA: Insufficient documentation

## 2019-10-25 DIAGNOSIS — Z95828 Presence of other vascular implants and grafts: Secondary | ICD-10-CM

## 2019-10-25 DIAGNOSIS — Z79899 Other long term (current) drug therapy: Secondary | ICD-10-CM | POA: Insufficient documentation

## 2019-10-25 LAB — CBC WITH DIFFERENTIAL (CANCER CENTER ONLY)
Abs Immature Granulocytes: 0.01 10*3/uL (ref 0.00–0.07)
Basophils Absolute: 0 10*3/uL (ref 0.0–0.1)
Basophils Relative: 1 %
Eosinophils Absolute: 0.1 10*3/uL (ref 0.0–0.5)
Eosinophils Relative: 2 %
HCT: 40.3 % (ref 36.0–46.0)
Hemoglobin: 14.1 g/dL (ref 12.0–15.0)
Immature Granulocytes: 0 %
Lymphocytes Relative: 24 %
Lymphs Abs: 1.5 10*3/uL (ref 0.7–4.0)
MCH: 33.5 pg (ref 26.0–34.0)
MCHC: 35 g/dL (ref 30.0–36.0)
MCV: 95.7 fL (ref 80.0–100.0)
Monocytes Absolute: 0.6 10*3/uL (ref 0.1–1.0)
Monocytes Relative: 9 %
Neutro Abs: 4.1 10*3/uL (ref 1.7–7.7)
Neutrophils Relative %: 64 %
Platelet Count: 203 10*3/uL (ref 150–400)
RBC: 4.21 MIL/uL (ref 3.87–5.11)
RDW: 12.7 % (ref 11.5–15.5)
WBC Count: 6.3 10*3/uL (ref 4.0–10.5)
nRBC: 0 % (ref 0.0–0.2)

## 2019-10-25 LAB — CMP (CANCER CENTER ONLY)
ALT: 29 U/L (ref 0–44)
AST: 32 U/L (ref 15–41)
Albumin: 3.8 g/dL (ref 3.5–5.0)
Alkaline Phosphatase: 106 U/L (ref 38–126)
Anion gap: 6 (ref 5–15)
BUN: 11 mg/dL (ref 8–23)
CO2: 28 mmol/L (ref 22–32)
Calcium: 9.4 mg/dL (ref 8.9–10.3)
Chloride: 106 mmol/L (ref 98–111)
Creatinine: 0.74 mg/dL (ref 0.44–1.00)
GFR, Est AFR Am: >60 mL/min (ref >60)
GFR, Estimated: >60 mL/min (ref >60)
Glucose, Bld: 117 mg/dL — ABNORMAL HIGH (ref 70–99)
Potassium: 4.6 mmol/L (ref 3.5–5.1)
Sodium: 140 mmol/L (ref 135–145)
Total Bilirubin: 1.2 mg/dL (ref 0.3–1.2)
Total Protein: 6.8 g/dL (ref 6.5–8.1)

## 2019-10-25 LAB — TSH: TSH: 2.09 u[IU]/mL (ref 0.308–3.960)

## 2019-10-25 MED ORDER — SODIUM CHLORIDE 0.9% FLUSH
10.0000 mL | INTRAVENOUS | Status: DC | PRN
  Administered 2019-10-25: 10 mL
  Filled 2019-10-25: qty 10

## 2019-10-25 MED ORDER — SODIUM CHLORIDE 0.9 % IV SOLN
480.0000 mg | Freq: Once | INTRAVENOUS | Status: AC
  Administered 2019-10-25: 480 mg via INTRAVENOUS
  Filled 2019-10-25: qty 48

## 2019-10-25 MED ORDER — SODIUM CHLORIDE 0.9 % IV SOLN
Freq: Once | INTRAVENOUS | Status: AC
  Filled 2019-10-25: qty 250

## 2019-10-25 MED ORDER — HEPARIN SOD (PORK) LOCK FLUSH 100 UNIT/ML IV SOLN
500.0000 [IU] | Freq: Once | INTRAVENOUS | Status: AC | PRN
  Administered 2019-10-25: 500 [IU]
  Filled 2019-10-25: qty 5

## 2019-10-25 MED ORDER — SODIUM CHLORIDE 0.9% FLUSH
10.0000 mL | INTRAVENOUS | Status: DC | PRN
  Administered 2019-10-25: 10 mL via INTRAVENOUS
  Filled 2019-10-25: qty 10

## 2019-10-25 NOTE — Patient Instructions (Signed)
[facility] Discharge Instructions for Patients Receiving Chemotherapy  Today you received the following chemotherapy agents Opdivo  To help prevent nausea and vomiting after your treatment, we encourage you to take your nausea medication as directed   If you develop nausea and vomiting that is not controlled by your nausea medication, call the clinic.   BELOW ARE SYMPTOMS THAT SHOULD BE REPORTED IMMEDIATELY:  *FEVER GREATER THAN 100.5 F  *CHILLS WITH OR WITHOUT FEVER  NAUSEA AND VOMITING THAT IS NOT CONTROLLED WITH YOUR NAUSEA MEDICATION  *UNUSUAL SHORTNESS OF BREATH  *UNUSUAL BRUISING OR BLEEDING  TENDERNESS IN MOUTH AND THROAT WITH OR WITHOUT PRESENCE OF ULCERS  *URINARY PROBLEMS  *BOWEL PROBLEMS  UNUSUAL RASH Items with * indicate a potential emergency and should be followed up as soon as possible.  Feel free to call the clinic should you have any questions or concerns. The clinic phone number is [phone].  Please show the CHEMO ALERT CARD at check-in to the Emergency Department and triage nurse.   

## 2019-11-01 ENCOUNTER — Ambulatory Visit (INDEPENDENT_AMBULATORY_CARE_PROVIDER_SITE_OTHER): Payer: Medicare Other | Admitting: *Deleted

## 2019-11-01 DIAGNOSIS — I495 Sick sinus syndrome: Secondary | ICD-10-CM | POA: Diagnosis not present

## 2019-11-01 LAB — CUP PACEART REMOTE DEVICE CHECK
Battery Remaining Longevity: 119 mo
Battery Remaining Percentage: 95.5 %
Battery Voltage: 3.01 V
Brady Statistic AP VP Percent: 1 %
Brady Statistic AP VS Percent: 1 %
Brady Statistic AS VP Percent: 1 %
Brady Statistic AS VS Percent: 99 %
Brady Statistic RA Percent Paced: 1 %
Brady Statistic RV Percent Paced: 19 %
Date Time Interrogation Session: 20210330022514
Implantable Lead Implant Date: 20190329
Implantable Lead Implant Date: 20190329
Implantable Lead Location: 753859
Implantable Lead Location: 753860
Implantable Pulse Generator Implant Date: 20190329
Lead Channel Impedance Value: 400 Ohm
Lead Channel Impedance Value: 480 Ohm
Lead Channel Pacing Threshold Amplitude: 1 V
Lead Channel Pacing Threshold Amplitude: 1 V
Lead Channel Pacing Threshold Pulse Width: 0.5 ms
Lead Channel Pacing Threshold Pulse Width: 0.5 ms
Lead Channel Sensing Intrinsic Amplitude: 12 mV
Lead Channel Sensing Intrinsic Amplitude: 3 mV
Lead Channel Setting Pacing Amplitude: 2 V
Lead Channel Setting Pacing Amplitude: 2.5 V
Lead Channel Setting Pacing Pulse Width: 0.5 ms
Lead Channel Setting Sensing Sensitivity: 2 mV
Pulse Gen Model: 2272
Pulse Gen Serial Number: 9003317

## 2019-11-01 NOTE — Progress Notes (Signed)
PPM remote 

## 2019-11-16 NOTE — Progress Notes (Signed)
Pharmacist Chemotherapy Monitoring - Follow Up Assessment    I verify that I have reviewed each item in the below checklist:  . Regimen for the patient is scheduled for the appropriate day and plan matches scheduled date. [name] Appropriate non-routine labs are ordered dependent on drug ordered. . If applicable, additional medications reviewed and ordered per protocol based on lifetime cumulative doses and/or treatment regimen.   Plan for follow-up and/or issues identified: No . I-vent associated with next due treatment: No . MD and/or nursing notified: No  Emmit Oriley D 11/16/2019 11:07 AM

## 2019-11-17 ENCOUNTER — Other Ambulatory Visit: Payer: Self-pay

## 2019-11-17 ENCOUNTER — Ambulatory Visit (HOSPITAL_COMMUNITY)
Admission: RE | Admit: 2019-11-17 | Discharge: 2019-11-17 | Disposition: A | Discharge status: Home / Self Care | Payer: Medicare Other | Source: Ambulatory Visit | Attending: Physician Assistant | Admitting: Physician Assistant

## 2019-11-17 DIAGNOSIS — C3491 Malignant neoplasm of unspecified part of right bronchus or lung: Secondary | ICD-10-CM | POA: Diagnosis present

## 2019-11-17 MED ORDER — HEPARIN SOD (PORK) LOCK FLUSH 100 UNIT/ML IV SOLN
INTRAVENOUS | Status: AC
  Filled 2019-11-17: qty 5

## 2019-11-17 MED ORDER — HEPARIN SOD (PORK) LOCK FLUSH 100 UNIT/ML IV SOLN
500.0000 [IU] | Freq: Once | INTRAVENOUS | Status: AC
  Administered 2019-11-17: 10:00:00 500 [IU] via INTRAVENOUS

## 2019-11-17 MED ORDER — IOHEXOL 300 MG/ML  SOLN
100.0000 mL | Freq: Once | INTRAMUSCULAR | Status: AC | PRN
  Administered 2019-11-17: 100 mL via INTRAVENOUS

## 2019-11-17 MED ORDER — SODIUM CHLORIDE (PF) 0.9 % IJ SOLN
INTRAMUSCULAR | Status: AC
  Filled 2019-11-17: qty 50

## 2019-11-22 ENCOUNTER — Telehealth: Payer: Self-pay | Admitting: Internal Medicine

## 2019-11-22 ENCOUNTER — Encounter: Payer: Self-pay | Admitting: *Deleted

## 2019-11-22 ENCOUNTER — Inpatient Hospital Stay: Payer: Medicare Other

## 2019-11-22 ENCOUNTER — Ambulatory Visit: Payer: Medicare Other | Admitting: Physician Assistant

## 2019-11-22 ENCOUNTER — Other Ambulatory Visit: Payer: Self-pay

## 2019-11-22 ENCOUNTER — Inpatient Hospital Stay (HOSPITAL_BASED_OUTPATIENT_CLINIC_OR_DEPARTMENT_OTHER): Payer: Medicare Other | Admitting: Internal Medicine

## 2019-11-22 ENCOUNTER — Encounter: Payer: Self-pay | Admitting: Internal Medicine

## 2019-11-22 ENCOUNTER — Inpatient Hospital Stay: Payer: Medicare Other | Attending: Internal Medicine

## 2019-11-22 VITALS — BP 140/60 | HR 81 | Temp 98.3°F | Resp 17 | Ht 62.0 in | Wt 149.7 lb

## 2019-11-22 DIAGNOSIS — I480 Paroxysmal atrial fibrillation: Secondary | ICD-10-CM

## 2019-11-22 DIAGNOSIS — Z5112 Encounter for antineoplastic immunotherapy: Secondary | ICD-10-CM | POA: Diagnosis present

## 2019-11-22 DIAGNOSIS — C3491 Malignant neoplasm of unspecified part of right bronchus or lung: Secondary | ICD-10-CM

## 2019-11-22 DIAGNOSIS — Z95828 Presence of other vascular implants and grafts: Secondary | ICD-10-CM

## 2019-11-22 DIAGNOSIS — Z79899 Other long term (current) drug therapy: Secondary | ICD-10-CM | POA: Insufficient documentation

## 2019-11-22 DIAGNOSIS — C3411 Malignant neoplasm of upper lobe, right bronchus or lung: Secondary | ICD-10-CM | POA: Diagnosis present

## 2019-11-22 DIAGNOSIS — C773 Secondary and unspecified malignant neoplasm of axilla and upper limb lymph nodes: Secondary | ICD-10-CM | POA: Insufficient documentation

## 2019-11-22 DIAGNOSIS — E032 Hypothyroidism due to medicaments and other exogenous substances: Secondary | ICD-10-CM

## 2019-11-22 LAB — CBC WITH DIFFERENTIAL (CANCER CENTER ONLY)
Abs Immature Granulocytes: 0.01 10*3/uL (ref 0.00–0.07)
Basophils Absolute: 0.1 10*3/uL (ref 0.0–0.1)
Basophils Relative: 1 %
Eosinophils Absolute: 0.1 10*3/uL (ref 0.0–0.5)
Eosinophils Relative: 3 %
HCT: 43.1 % (ref 36.0–46.0)
Hemoglobin: 14.9 g/dL (ref 12.0–15.0)
Immature Granulocytes: 0 %
Lymphocytes Relative: 26 %
Lymphs Abs: 1.3 10*3/uL (ref 0.7–4.0)
MCH: 33 pg (ref 26.0–34.0)
MCHC: 34.6 g/dL (ref 30.0–36.0)
MCV: 95.6 fL (ref 80.0–100.0)
Monocytes Absolute: 0.5 10*3/uL (ref 0.1–1.0)
Monocytes Relative: 9 %
Neutro Abs: 3 10*3/uL (ref 1.7–7.7)
Neutrophils Relative %: 61 %
Platelet Count: 206 10*3/uL (ref 150–400)
RBC: 4.51 MIL/uL (ref 3.87–5.11)
RDW: 13.2 % (ref 11.5–15.5)
WBC Count: 5 10*3/uL (ref 4.0–10.5)
nRBC: 0 % (ref 0.0–0.2)

## 2019-11-22 LAB — CMP (CANCER CENTER ONLY)
ALT: 24 U/L (ref 0–44)
AST: 32 U/L (ref 15–41)
Albumin: 3.9 g/dL (ref 3.5–5.0)
Alkaline Phosphatase: 98 U/L (ref 38–126)
Anion gap: 8 (ref 5–15)
BUN: 12 mg/dL (ref 8–23)
CO2: 27 mmol/L (ref 22–32)
Calcium: 9.3 mg/dL (ref 8.9–10.3)
Chloride: 106 mmol/L (ref 98–111)
Creatinine: 0.76 mg/dL (ref 0.44–1.00)
GFR, Est AFR Am: >60 mL/min (ref >60)
GFR, Estimated: >60 mL/min (ref >60)
Glucose, Bld: 114 mg/dL — ABNORMAL HIGH (ref 70–99)
Potassium: 4.2 mmol/L (ref 3.5–5.1)
Sodium: 141 mmol/L (ref 135–145)
Total Bilirubin: 1.4 mg/dL — ABNORMAL HIGH (ref 0.3–1.2)
Total Protein: 7 g/dL (ref 6.5–8.1)

## 2019-11-22 LAB — TSH: TSH: 3.098 u[IU]/mL (ref 0.308–3.960)

## 2019-11-22 MED ORDER — SODIUM CHLORIDE 0.9% FLUSH
10.0000 mL | INTRAVENOUS | Status: DC | PRN
  Administered 2019-11-22: 11:00:00 10 mL via INTRAVENOUS
  Filled 2019-11-22: qty 10

## 2019-11-22 MED ORDER — SODIUM CHLORIDE 0.9% FLUSH
10.0000 mL | INTRAVENOUS | Status: DC | PRN
  Administered 2019-11-22: 10 mL
  Filled 2019-11-22: qty 10

## 2019-11-22 MED ORDER — HEPARIN SOD (PORK) LOCK FLUSH 100 UNIT/ML IV SOLN
500.0000 [IU] | Freq: Once | INTRAVENOUS | Status: AC | PRN
  Administered 2019-11-22: 13:00:00 500 [IU]
  Filled 2019-11-22: qty 5

## 2019-11-22 MED ORDER — SODIUM CHLORIDE 0.9 % IV SOLN
480.0000 mg | Freq: Once | INTRAVENOUS | Status: AC
  Administered 2019-11-22: 480 mg via INTRAVENOUS
  Filled 2019-11-22: qty 48

## 2019-11-22 MED ORDER — HEPARIN SOD (PORK) LOCK FLUSH 100 UNIT/ML IV SOLN
500.0000 [IU] | Freq: Once | INTRAVENOUS | Status: DC
  Filled 2019-11-22: qty 5

## 2019-11-22 MED ORDER — SODIUM CHLORIDE 0.9 % IV SOLN
Freq: Once | INTRAVENOUS | Status: AC
  Filled 2019-11-22: qty 250

## 2019-11-22 NOTE — Telephone Encounter (Signed)
Scheduled per 04/20 los, patient received after visit summary and calender.

## 2019-11-22 NOTE — Progress Notes (Signed)
Oncology Nurse Navigator Documentation  Oncology Nurse Navigator Flowsheets 11/22/2019  Abnormal Finding Date -  Confirmed Diagnosis Date -  Diagnosis Status -  Planned Course of Treatment -  Phase of Treatment -  Chemotherapy Actual Start Date: -  Chemotherapy Actual End Date: -  Targeted Therapy Actual Start Date: -  Surgery Actual Start Date: -  Navigator Follow Up Date: -  Navigator Follow Up Reason: -  Navigation Complete Date: -  Post Navigation: Continue to Follow Patient? 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  She is doing well without complaint.  She states she has been working outside a lot and encourage her to wear sunscreen and the potential for dehydration.  I offered support and encouragement.   Telephone -  Treatment Initiated Date -  Patient Visit Type MedOnc  Treatment Phase Treatment  Barriers/Navigation Needs Education  Education Other  Interventions Education;Psycho-Social Support  Acuity Level 2-Minimal Needs (1-2 Barriers Identified)  Coordination of Care -  Education Method -  Time Spent with Patient 15

## 2019-11-22 NOTE — Progress Notes (Signed)
[place] [phone]   [phone]) [phone]  OFFICE PROGRESS NOTE  Harris, [name], [name] [address] [place]  [address]  DIAGNOSIS: Metastatic non-small cell lung cancer, adenocarcinoma. This was initially diagnosed as stage IB (T2a, N0, M0) in March of 2014, status post right upper lobectomy with lymph node dissection but the patient has evidence for disease recurrence in the right axilla status post resection.  PRIOR THERAPY:  1) Status post right axillary lymph node biopsy on 05/01/2014 under the care of Dr. Roxan Hockey. 2) Systemic chemotherapy with carboplatin for AUC of 5 and Alimta 500 mg/M2 every 3 weeks. First cycle 05/31/2014. Status post 6 cycles. Last dose was given 09/22/2014. 3) palliative radiotherapy to the subcarinal lymph node under the care of Dr. Sondra Come. 4) Nivolumab 240 mg IV every 2 weeks. First dose 12/23/2016. Status post 6 cycles.  CURRENT THERAPY:  Nivolumab 480 mg IV every 4 weeks. First dose 03/17/2017. Status post 35  cycles.  INTERVAL HISTORY: Nancy Hodges 74 y.o. female returns to the clinic today for follow-up visit.  The patient is feeling fine today with no concerning complaints except for right shoulder pain.  She works a lot in her backyard.  She denied having any current chest pain, shortness of breath, cough or hemoptysis.  She denied having any fever or chills.  She has no nausea, vomiting, diarrhea or constipation.  She denied having any headache or visual changes.  She continues to tolerate her treatment with nivolumab fairly well.  The patient is here today for evaluation with repeat CT scan of the chest, abdomen pelvis for restaging of her disease before starting cycle #36.  MEDICAL HISTORY: Past Medical History:  Diagnosis Date  . Allergic rhinitis   . Anxiety   . Arthritis   . Asthma   . Atrial flutter ([place])   . Cholelithiases 09/26/2015  . Diverticulosis   . Encounter for antineoplastic  immunotherapy 12/09/2016  . GERD (gastroesophageal reflux disease)   . H/O hiatal hernia   . History of blood transfusion   . History of chemotherapy   . History of kidney stones   . History of radiation therapy   . Internal hemorrhoid   . Lung cancer ([place])    a. Stage IB non-small cell carcinoma, s/p R VATS, wedge resection, RU lobectomy 10/2012.  [name] Persistent atrial fibrillation ([facility])    a. anticoagulated with Xarelto  . Pneumonia 06/2016   [facility]  . Presence of permanent cardiac pacemaker   . Pulmonary nodule   . Tachycardia-bradycardia syndrome ([place])    a. 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    ALLERGIES:  is allergic to chlorpheniramine-dm; prednisone; septra [sulfamethoxazole-trimethoprim]; vioxx [rofecoxib]; and penicillins.  MEDICATIONS:  Current Outpatient Medications  Medication Sig Dispense Refill  . acetaminophen (TYLENOL) 500 MG tablet Take 500 mg by mouth 3 (three) times daily as needed for moderate pain or fever.     [name] albuterol (PROVENTIL HFA;VENTOLIN HFA) 108 (90 BASE) MCG/ACT inhaler Inhale 2 puffs into the lungs every 6 (six) hours as needed for wheezing or shortness of breath.     [name] azelastine (ASTELIN) 0.1 % nasal spray Place 1 spray into both nostrils 2 (two) times daily. Use in each nostril as directed    . calcium carbonate (TUMS - DOSED IN MG ELEMENTAL CALCIUM) 500 MG chewable tablet Chew 1 tablet by mouth 2 (two) times daily as needed for indigestion or heartburn.     [name]  diltiazem (CARTIA XT) 240 MG 24 hr capsule Take 1 capsule (240 mg total) by mouth daily. 90 capsule 2  . dimenhyDRINATE (DRAMAMINE) 50 MG tablet Take 12.5 mg by mouth every 8 (eight) hours as needed for dizziness.    [name] ipratropium-albuterol (DUONEB) 0.5-2.5 (3) MG/3ML SOLN Take 3 mLs by nebulization every 6 (six) hours as needed (shortness of breath or wheezing).     [name] lidocaine-prilocaine (EMLA) cream APPLY TOPICALLY TO TO THE AFFECTED AREA AS NEEDED 30 g 4  . loratadine  (CLARITIN) 10 MG tablet Take 10 mg by mouth daily.    [name] OVER THE COUNTER MEDICATION Take 5 mLs by mouth daily as needed (cough). [employer] kids cough syrup    . Polyvinyl Alcohol-Povidone (REFRESH OP) Place 1 drop into both eyes daily as needed (dry eyes).    . rivaroxaban (XARELTO) 20 MG TABS tablet TAKE 1 TABLET(20 MG) BY MOUTH DAILY WITH SUPPER 30 tablet 10   No current facility-administered medications for this visit.    SURGICAL HISTORY:  Past Surgical History:  Procedure Laterality Date  . CARDIOVERSION N/A 02/19/2018   Procedure: CARDIOVERSION;  Surgeon: Sanda Klein, MD;  Location: [facility] ENDOSCOPY;  Service: Cardiovascular;  Laterality: N/A;  . CARDIOVERSION N/A 04/30/2018   Procedure: CARDIOVERSION;  Surgeon: Thayer Headings, MD;  Location: [facility] ENDOSCOPY;  Service: Cardiovascular;  Laterality: N/A;  . CATARACT EXTRACTION W/PHACO Right 08/14/2016   Procedure: CATARACT EXTRACTION PHACO AND INTRAOCULAR LENS PLACEMENT RIGHT EYE CDE 10.53;  Surgeon: Tonny Branch, MD;  Location: [facility] OR[facility];  Service: Ophthalmology;  Laterality: Right;  right  . CATARACT EXTRACTION W/PHACO Left 09/25/2016   Procedure: CATARACT EXTRACTION PHACO AND INTRAOCULAR LENS PLACEMENT (IOC);  Surgeon: Tonny Branch, MD;  Location: [facility] OR[facility];  Service: Ophthalmology;  Laterality: Left;  left CDE 8.81  . COLONOSCOPY     [facility]: cattered sigmoid diverticula, internal hemorrhoids, sessile polyp in ascending and mid transverse colon (tubular adenoma).   . colonoscopy with polypectomy  2015  . ESOPHAGOGASTRODUODENOSCOPY     [facility]: bile reflux in stomach, mild gastritis, hiatal hernia  . ESOPHAGOGASTRODUODENOSCOPY N/A 01/31/2016   Procedure: ESOPHAGOGASTRODUODENOSCOPY (EGD);  Surgeon: [name], MD;  Location: [facility];  Service: Endoscopy;  Laterality: N/A;  1200  . IR FLUORO GUIDE PORT INSERTION RIGHT  06/30/2017  . IR US GUIDE VASC ACCESS RIGHT  06/30/2017  . LOBECTOMY Right 10/27/2012   Procedure: LOBECTOMY;  Surgeon:  [name], MD;  Location: [facility];  Service: Thoracic;  Laterality: Right;   RIGHT UPPER LOBECTOMY & Node Dissection  . LYMPH NODE BIOPSY Right 05/01/2014   Procedure: LYMPH NODE BIOPSY, Right Axillary;  Surgeon: [name], MD;  Location: [facility];  Service: Thoracic;  Laterality: Right;  . PACEMAKER IMPLANT N/A 10/30/2017   SJM Assurity MRI PPM implanted by Dr [name] once leadless pacemaker battery depleted  . PARTIAL HYSTERECTOMY    . PERMANENT PACEMAKER INSERTION N/A 09/14/2012   Nanostim (SJM) leadless pacemaker (LEADLESS II STUDY PATIENT) implanted by Dr [name], no longer functioning, device abandoned.  [name] VIDEO ASSISTED THORACOSCOPY (VATS)/WEDGE RESECTION Right 10/27/2012   Procedure: VIDEO ASSISTED THORACOSCOPY (VATS),RGHT UPPER LOBE LUNG WEDGE RESECTION;  Surgeon: [name], MD;  Location: [facility];  Service: Thoracic;  Laterality: Right;    REVIEW OF SYSTEMS:  Constitutional: negative Eyes: negative Ears, nose, mouth, throat, and face: negative Respiratory: negative Cardiovascular: negative Gastrointestinal: negative Genitourinary:negative Integument/breast: negative Hematologic/lymphatic: negative Musculoskeletal:positive for arthralgias Neurological: negative Behavioral/Psych: negative Endocrine: negative Allergic/Immunologic: negative  PHYSICAL EXAMINATION: General appearance: alert, cooperative and no distress Head: Normocephalic, without obvious abnormality, atraumatic Neck: no adenopathy, no JVD, supple, symmetrical, trachea midline and thyroid not enlarged, symmetric, no tenderness/mass/nodules Lymph nodes: Cervical, supraclavicular, and axillary nodes normal. Resp: clear to auscultation bilaterally Back: symmetric, no curvature. ROM normal. No CVA tenderness. Cardio: regular rate and rhythm, S1, S2 normal, no murmur, click, rub or gallop GI: soft, non-tender; bowel sounds normal; no masses,  no organomegaly Extremities: extremities normal,  atraumatic, no cyanosis or edema Neurologic: Alert and oriented X 3, normal strength and tone. Normal symmetric reflexes. Normal coordination and gait  ECOG PERFORMANCE STATUS: 1 - Symptomatic but completely ambulatory  Blood pressure 140/60, pulse 81, temperature 98.3 F (36.8 C), temperature source Temporal, resp. rate 17, height 5\' 2"  (1.575 m), weight 149 lb 11.2 oz (67.9 kg), SpO2 99 %.  LABORATORY DATA: Lab Results  Component Value Date   WBC 5.0 11/22/2019   HGB 14.9 11/22/2019   HCT 43.1 11/22/2019   MCV 95.6 11/22/2019   PLT 206 11/22/2019      Chemistry      Component Value Date/Time   NA 141 11/22/2019 1036   NA 139 08/05/2017 0902   K 4.2 11/22/2019 1036   K 4.1 08/05/2017 0902   CL 106 11/22/2019 1036   CO2 27 11/22/2019 1036   CO2 28 08/05/2017 0902   BUN 12 11/22/2019 1036   BUN 10.1 08/05/2017 0902   CREATININE 0.76 11/22/2019 1036   CREATININE 0.8 08/05/2017 0902   GLU 134 (H) 02/18/2017 1410      Component Value Date/Time   CALCIUM 9.3 11/22/2019 1036   CALCIUM 9.5 08/05/2017 0902   ALKPHOS 98 11/22/2019 1036   ALKPHOS 111 08/05/2017 0902   AST 32 11/22/2019 1036   AST 33 08/05/2017 0902   ALT 24 11/22/2019 1036   ALT 34 08/05/2017 0902   BILITOT 1.4 (H) 11/22/2019 1036   BILITOT 1.39 (H) 08/05/2017 0902       RADIOGRAPHIC STUDIES: CT Chest W Contrast  Result Date: 11/17/2019 CLINICAL DATA:  Right lung cancer restaging. Ongoing chemotherapy. Prior radiation therapy in 2017. Right lobectomy. EXAM: CT CHEST, ABDOMEN, AND PELVIS WITH CONTRAST TECHNIQUE: Multidetector CT imaging of the chest, abdomen and pelvis was performed following the standard protocol during bolus administration of intravenous contrast. CONTRAST:  128mL OMNIPAQUE IOHEXOL 300 MG/ML  SOLN COMPARISON:  08/26/2019 FINDINGS: CT CHEST FINDINGS Cardiovascular: Right Port-A-Cath tip: Right atrium. Left pacer device noted. Coronary, aortic arch, and branch vessel atherosclerotic vascular  disease. Mediastinum/Nodes: Small type 1 hiatal hernia. Right upper paratracheal node 0.6 cm in short axis on image 8/2, stable. No overtly pathologic adenopathy is identified. Lungs/Pleura: Biapical pleuroparenchymal scarring. Centrilobular emphysema. Right upper lobectomy. Radiation therapy findings along the left lower lobe in the posterior paramediastinal region, unchanged. Peripheral scarring or radiation therapy related findings in the right lower lobe. Musculoskeletal: Unremarkable CT ABDOMEN PELVIS FINDINGS Hepatobiliary: Hepatic morphology indicating cirrhosis. Intrahepatic portal venous branches in segment 4 appear to connect to extrahepatic collateral vessels indicating portal venous hypertension. No discrete hepatic mass is identified. Multiple gallstones are present in the gallbladder. No biliary dilatation. Pancreas: Unremarkable Spleen: The spleen measures 13.7 by 5.1 by 9.6 cm (volume = 350 cm^3). Stable hypodense splenic lesion on image 62/2, likely benign. Adrenals/Urinary Tract: Both adrenal glands appear normal. Mild bilateral renal scarring. Adrenal glands normal. Stomach/Bowel: Descending colon diverticulosis. Vascular/Lymphatic: Aortoiliac atherosclerotic vascular disease. Scattered small right gastric, peripancreatic, porta hepatis nodes are likely reactive to  the patient's cirrhosis. An index peripancreatic node measures 0.8 cm in short axis on image 61/2, formerly 1.0 cm. Reproductive: Uterus absent. Adnexa unremarkable. Other: No supplemental non-categorized findings. Musculoskeletal: Unremarkable IMPRESSION: 1. No findings of recurrent malignancy. 2. Other imaging findings of potential clinical significance: Coronary atherosclerosis. Small type 1 hiatal hernia. Hepatic cirrhosis with portal venous hypertension. Cholelithiasis. Descending colon diverticulosis. 3. Emphysema and aortic atherosclerosis. Aortic Atherosclerosis (ICD10-I70.0) and Emphysema (ICD10-J43.9). Electronically Signed    By: Van Clines M.D.   On: 11/17/2019 14:45   CT Abdomen Pelvis W Contrast  Result Date: 11/17/2019 CLINICAL DATA:  Right lung cancer restaging. Ongoing chemotherapy. Prior radiation therapy in 2017. Right lobectomy. EXAM: CT CHEST, ABDOMEN, AND PELVIS WITH CONTRAST TECHNIQUE: Multidetector CT imaging of the chest, abdomen and pelvis was performed following the standard protocol during bolus administration of intravenous contrast. CONTRAST:  185mL OMNIPAQUE IOHEXOL 300 MG/ML  SOLN COMPARISON:  08/26/2019 FINDINGS: CT CHEST FINDINGS Cardiovascular: Right Port-A-Cath tip: Right atrium. Left pacer device noted. Coronary, aortic arch, and branch vessel atherosclerotic vascular disease. Mediastinum/Nodes: Small type 1 hiatal hernia. Right upper paratracheal node 0.6 cm in short axis on image 8/2, stable. No overtly pathologic adenopathy is identified. Lungs/Pleura: Biapical pleuroparenchymal scarring. Centrilobular emphysema. Right upper lobectomy. Radiation therapy findings along the left lower lobe in the posterior paramediastinal region, unchanged. Peripheral scarring or radiation therapy related findings in the right lower lobe. Musculoskeletal: Unremarkable CT ABDOMEN PELVIS FINDINGS Hepatobiliary: Hepatic morphology indicating cirrhosis. Intrahepatic portal venous branches in segment 4 appear to connect to extrahepatic collateral vessels indicating portal venous hypertension. No discrete hepatic mass is identified. Multiple gallstones are present in the gallbladder. No biliary dilatation. Pancreas: Unremarkable Spleen: The spleen measures 13.7 by 5.1 by 9.6 cm (volume = 350 cm^3). Stable hypodense splenic lesion on image 62/2, likely benign. Adrenals/Urinary Tract: Both adrenal glands appear normal. Mild bilateral renal scarring. Adrenal glands normal. Stomach/Bowel: Descending colon diverticulosis. Vascular/Lymphatic: Aortoiliac atherosclerotic vascular disease. Scattered small right gastric,  peripancreatic, porta hepatis nodes are likely reactive to the patient's cirrhosis. An index peripancreatic node measures 0.8 cm in short axis on image 61/2, formerly 1.0 cm. Reproductive: Uterus absent. Adnexa unremarkable. Other: No supplemental non-categorized findings. Musculoskeletal: Unremarkable IMPRESSION: 1. No findings of recurrent malignancy. 2. Other imaging findings of potential clinical significance: Coronary atherosclerosis. Small type 1 hiatal hernia. Hepatic cirrhosis with portal venous hypertension. Cholelithiasis. Descending colon diverticulosis. 3. Emphysema and aortic atherosclerosis. Aortic Atherosclerosis (ICD10-I70.0) and Emphysema (ICD10-J43.9). Electronically Signed   By: Van Clines M.D.   On: 11/17/2019 14:45   CUP PACEART REMOTE DEVICE CHECK  Result Date: 11/01/2019 Scheduled remote reviewed. Normal device function. AF burden 77%. Presenting rhythm AF with Vp/Vs, persistent since at least 09/16/19. Known persistent AF with controlled rates. OAC- Xarelto Next remote 91 days- [employer], RN/CVRS  ASSESSMENT AND PLAN:  This is a very pleasant 73 years old white female with metastatic non-small cell lung cancer, adenocarcinoma with no actionable mutations status post systemic chemotherapy with carboplatin and Alimta and she is currently undergoing treatment with second line immunotherapy with Nivolumab status post 6 cycles. This was followed by immunotherapy with Nivolumab 480 mg IV every 4 weeks status post 35 cycles. She has been tolerating this treatment well with no concerning adverse effects. The patient had repeat CT scan of the chest, abdomen pelvis performed recently.  I personally and independently reviewed the scan and discussed the results with the patient today. HIDA scan showed no concerning findings for disease progression. I recommended for her  to continue her current treatment with nivolumab and she will proceed with cycle #36 today. The patient will continue  her routine follow-up with her cardiologist Dr. [name] for the cardiac arrhythmia. She will come back for follow-up visit in 4 weeks for evaluation before the next cycle of her treatment. She was advised to call immediately if she has any concerning symptoms in the interval.  The patient voices understanding of current disease status and treatment options and is in agreement with the current care plan. All questions were answered. The patient knows to call the clinic with any problems, questions or concerns. We can certainly see the patient much sooner if necessary.  Disclaimer: This note was dictated with voice recognition software. Similar sounding words can inadvertently be transcribed and may not be corrected upon review.

## 2019-11-22 NOTE — Patient Instructions (Signed)
[facility] Discharge Instructions for Patients Receiving Chemotherapy  Today you received the following chemotherapy agents Opdivo  To help prevent nausea and vomiting after your treatment, we encourage you to take your nausea medication as directed   If you develop nausea and vomiting that is not controlled by your nausea medication, call the clinic.   BELOW ARE SYMPTOMS THAT SHOULD BE REPORTED IMMEDIATELY:  *FEVER GREATER THAN 100.5 F  *CHILLS WITH OR WITHOUT FEVER  NAUSEA AND VOMITING THAT IS NOT CONTROLLED WITH YOUR NAUSEA MEDICATION  *UNUSUAL SHORTNESS OF BREATH  *UNUSUAL BRUISING OR BLEEDING  TENDERNESS IN MOUTH AND THROAT WITH OR WITHOUT PRESENCE OF ULCERS  *URINARY PROBLEMS  *BOWEL PROBLEMS  UNUSUAL RASH Items with * indicate a potential emergency and should be followed up as soon as possible.  Feel free to call the clinic should you have any questions or concerns. The clinic phone number is [phone].  Please show the CHEMO ALERT CARD at check-in to the Emergency Department and triage nurse.   

## 2019-12-20 ENCOUNTER — Inpatient Hospital Stay: Payer: Medicare Other

## 2019-12-20 ENCOUNTER — Inpatient Hospital Stay: Payer: Medicare Other | Attending: Internal Medicine

## 2019-12-20 ENCOUNTER — Other Ambulatory Visit: Payer: Self-pay

## 2019-12-20 ENCOUNTER — Encounter: Payer: Self-pay | Admitting: Internal Medicine

## 2019-12-20 ENCOUNTER — Inpatient Hospital Stay (HOSPITAL_BASED_OUTPATIENT_CLINIC_OR_DEPARTMENT_OTHER): Payer: Medicare Other | Admitting: Internal Medicine

## 2019-12-20 VITALS — BP 160/82 | HR 74 | Temp 98.5°F | Resp 20 | Ht 62.0 in | Wt 151.4 lb

## 2019-12-20 DIAGNOSIS — Z95828 Presence of other vascular implants and grafts: Secondary | ICD-10-CM

## 2019-12-20 DIAGNOSIS — C3491 Malignant neoplasm of unspecified part of right bronchus or lung: Secondary | ICD-10-CM

## 2019-12-20 DIAGNOSIS — C3411 Malignant neoplasm of upper lobe, right bronchus or lung: Secondary | ICD-10-CM | POA: Insufficient documentation

## 2019-12-20 DIAGNOSIS — C773 Secondary and unspecified malignant neoplasm of axilla and upper limb lymph nodes: Secondary | ICD-10-CM | POA: Insufficient documentation

## 2019-12-20 DIAGNOSIS — Z5112 Encounter for antineoplastic immunotherapy: Secondary | ICD-10-CM | POA: Insufficient documentation

## 2019-12-20 DIAGNOSIS — Z79899 Other long term (current) drug therapy: Secondary | ICD-10-CM | POA: Insufficient documentation

## 2019-12-20 DIAGNOSIS — E032 Hypothyroidism due to medicaments and other exogenous substances: Secondary | ICD-10-CM

## 2019-12-20 LAB — CMP (CANCER CENTER ONLY)
ALT: 38 U/L (ref 0–44)
AST: 44 U/L — ABNORMAL HIGH (ref 15–41)
Albumin: 3.9 g/dL (ref 3.5–5.0)
Alkaline Phosphatase: 122 U/L (ref 38–126)
Anion gap: 10 (ref 5–15)
BUN: 10 mg/dL (ref 8–23)
CO2: 26 mmol/L (ref 22–32)
Calcium: 9.6 mg/dL (ref 8.9–10.3)
Chloride: 106 mmol/L (ref 98–111)
Creatinine: 0.76 mg/dL (ref 0.44–1.00)
GFR, Est AFR Am: >60 mL/min (ref >60)
GFR, Estimated: >60 mL/min (ref >60)
Glucose, Bld: 128 mg/dL — ABNORMAL HIGH (ref 70–99)
Potassium: 4.5 mmol/L (ref 3.5–5.1)
Sodium: 142 mmol/L (ref 135–145)
Total Bilirubin: 0.9 mg/dL (ref 0.3–1.2)
Total Protein: 7.3 g/dL (ref 6.5–8.1)

## 2019-12-20 LAB — CBC WITH DIFFERENTIAL (CANCER CENTER ONLY)
Abs Immature Granulocytes: 0.01 10*3/uL (ref 0.00–0.07)
Basophils Absolute: 0.1 10*3/uL (ref 0.0–0.1)
Basophils Relative: 1 %
Eosinophils Absolute: 0.1 10*3/uL (ref 0.0–0.5)
Eosinophils Relative: 3 %
HCT: 41.8 % (ref 36.0–46.0)
Hemoglobin: 14.6 g/dL (ref 12.0–15.0)
Immature Granulocytes: 0 %
Lymphocytes Relative: 21 %
Lymphs Abs: 1.2 10*3/uL (ref 0.7–4.0)
MCH: 33.3 pg (ref 26.0–34.0)
MCHC: 34.9 g/dL (ref 30.0–36.0)
MCV: 95.2 fL (ref 80.0–100.0)
Monocytes Absolute: 0.4 10*3/uL (ref 0.1–1.0)
Monocytes Relative: 7 %
Neutro Abs: 3.9 10*3/uL (ref 1.7–7.7)
Neutrophils Relative %: 68 %
Platelet Count: 170 10*3/uL (ref 150–400)
RBC: 4.39 MIL/uL (ref 3.87–5.11)
RDW: 12.5 % (ref 11.5–15.5)
WBC Count: 5.7 10*3/uL (ref 4.0–10.5)
nRBC: 0 % (ref 0.0–0.2)

## 2019-12-20 LAB — TSH: TSH: 0.089 u[IU]/mL — ABNORMAL LOW (ref 0.308–3.960)

## 2019-12-20 MED ORDER — SODIUM CHLORIDE 0.9% FLUSH
10.0000 mL | INTRAVENOUS | Status: DC | PRN
  Administered 2019-12-20: 10 mL
  Filled 2019-12-20: qty 10

## 2019-12-20 MED ORDER — SODIUM CHLORIDE 0.9 % IV SOLN
480.0000 mg | Freq: Once | INTRAVENOUS | Status: AC
  Administered 2019-12-20: 480 mg via INTRAVENOUS
  Filled 2019-12-20: qty 48

## 2019-12-20 MED ORDER — SODIUM CHLORIDE 0.9% FLUSH
10.0000 mL | INTRAVENOUS | Status: DC | PRN
  Administered 2019-12-20: 10 mL via INTRAVENOUS
  Filled 2019-12-20: qty 10

## 2019-12-20 MED ORDER — HEPARIN SOD (PORK) LOCK FLUSH 100 UNIT/ML IV SOLN
500.0000 [IU] | Freq: Once | INTRAVENOUS | Status: AC | PRN
  Administered 2019-12-20: 500 [IU]
  Filled 2019-12-20: qty 5

## 2019-12-20 MED ORDER — SODIUM CHLORIDE 0.9 % IV SOLN
Freq: Once | INTRAVENOUS | Status: AC
  Filled 2019-12-20: qty 250

## 2019-12-20 NOTE — Patient Instructions (Signed)
[facility] Discharge Instructions for Patients Receiving Chemotherapy  Today you received the following chemotherapy agents Opdivo  To help prevent nausea and vomiting after your treatment, we encourage you to take your nausea medication as directed   If you develop nausea and vomiting that is not controlled by your nausea medication, call the clinic.   BELOW ARE SYMPTOMS THAT SHOULD BE REPORTED IMMEDIATELY:  *FEVER GREATER THAN 100.5 F  *CHILLS WITH OR WITHOUT FEVER  NAUSEA AND VOMITING THAT IS NOT CONTROLLED WITH YOUR NAUSEA MEDICATION  *UNUSUAL SHORTNESS OF BREATH  *UNUSUAL BRUISING OR BLEEDING  TENDERNESS IN MOUTH AND THROAT WITH OR WITHOUT PRESENCE OF ULCERS  *URINARY PROBLEMS  *BOWEL PROBLEMS  UNUSUAL RASH Items with * indicate a potential emergency and should be followed up as soon as possible.  Feel free to call the clinic should you have any questions or concerns. The clinic phone number is [phone].  Please show the CHEMO ALERT CARD at check-in to the Emergency Department and triage nurse.   

## 2019-12-20 NOTE — Progress Notes (Signed)
[facility] Telephone:(336) [phone]   Fax:([phone]  OFFICE PROGRESS NOTE  Harris, [name], [name] [address] [place] [address]  DIAGNOSIS: Metastatic non-small cell lung cancer, adenocarcinoma. This was initially diagnosed as stage IB (T2a, N0, M0) in March of 2014, status post right upper lobectomy with lymph node dissection but the patient has evidence for disease recurrence in the right axilla status post resection.  PRIOR THERAPY:  1) Status post right axillary lymph node biopsy on 05/01/2014 under the care of Dr. Roxan Hockey. 2) Systemic chemotherapy with carboplatin for AUC of 5 and Alimta 500 mg/M2 every 3 weeks. First cycle 05/31/2014. Status post 6 cycles. Last dose was given 09/22/2014. 3) palliative radiotherapy to the subcarinal lymph node under the care of Dr. Sondra Come. 4) Nivolumab 240 mg IV every 2 weeks. First dose 12/23/2016. Status post 6 cycles.  CURRENT THERAPY:  Nivolumab 480 mg IV every 4 weeks. First dose 03/17/2017. Status post 36 cycles.  INTERVAL HISTORY: Nancy Hodges 74 y.o. female returns to the clinic today for follow-up visit.  The patient has no complaints except for mild fatigue and intermittent right-sided neck pain.  She denied having any chest pain, shortness of breath, cough or hemoptysis.  She still very active and work on her yard at regular basis.  She denied having any nausea, vomiting, diarrhea or constipation.  She denied having any headache or visual changes.  She continues to tolerate her maintenance treatment with nivolumab fairly well.  The patient is here today for evaluation before starting cycle #37 of her treatment.  MEDICAL HISTORY: Past Medical History:  Diagnosis Date  . Allergic rhinitis   . Anxiety   . Arthritis   . Asthma   . Atrial flutter ([place])   . Cholelithiases 09/26/2015  . Diverticulosis   . Encounter for antineoplastic immunotherapy 12/09/2016  . GERD (gastroesophageal reflux disease)    . H/O hiatal hernia   . History of blood transfusion   . History of chemotherapy   . History of kidney stones   . History of radiation therapy   . Internal hemorrhoid   . Lung cancer ([place])    a. Stage IB non-small cell carcinoma, s/p R VATS, wedge resection, RU lobectomy 10/2012.  [name] Persistent atrial fibrillation (H[facility])    a. anticoagulated with Xarelto  . Pneumonia 06/2016   [facility]  . Presence of permanent cardiac pacemaker   . Pulmonary nodule   . Tachycardia-bradycardia syndrome ([place])    a. 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    ALLERGIES:  is allergic to chlorpheniramine-dm; prednisone; septra [sulfamethoxazole-trimethoprim]; vioxx [rofecoxib]; and penicillins.  MEDICATIONS:  Current Outpatient Medications  Medication Sig Dispense Refill  . acetaminophen (TYLENOL) 500 MG tablet Take 500 mg by mouth 3 (three) times daily as needed for moderate pain or fever.     [name] albuterol (PROVENTIL HFA;VENTOLIN HFA) 108 (90 BASE) MCG/ACT inhaler Inhale 2 puffs into the lungs every 6 (six) hours as needed for wheezing or shortness of breath.     [name] azelastine (ASTELIN) 0.1 % nasal spray Place 1 spray into both nostrils 2 (two) times daily. Use in each nostril as directed    . calcium carbonate (TUMS - DOSED IN MG ELEMENTAL CALCIUM) 500 MG chewable tablet Chew 1 tablet by mouth 2 (two) times daily as needed for indigestion or heartburn.     . diltiazem (CARTIA XT) 240 MG 24 hr capsule Take 1 capsule (240 mg total) by  mouth daily. 90 capsule 2  . dimenhyDRINATE (DRAMAMINE) 50 MG tablet Take 12.5 mg by mouth every 8 (eight) hours as needed for dizziness.    [name] ipratropium-albuterol (DUONEB) 0.5-2.5 (3) MG/3ML SOLN Take 3 mLs by nebulization every 6 (six) hours as needed (shortness of breath or wheezing).     [name] lidocaine-prilocaine (EMLA) cream APPLY TOPICALLY TO TO THE AFFECTED AREA AS NEEDED 30 g 4  . loratadine (CLARITIN) 10 MG tablet Take 10 mg by mouth daily.    [name] OVER THE COUNTER  MEDICATION Take 5 mLs by mouth daily as needed (cough). Hyland's kids cough syrup    . Polyvinyl Alcohol-Povidone (REFRESH OP) Place 1 drop into both eyes daily as needed (dry eyes).    . rivaroxaban (XARELTO) 20 MG TABS tablet TAKE 1 TABLET(20 MG) BY MOUTH DAILY WITH SUPPER 30 tablet 10   No current facility-administered medications for this visit.    SURGICAL HISTORY:  Past Surgical History:  Procedure Laterality Date  . CARDIOVERSION N/A 02/19/2018   Procedure: CARDIOVERSION;  Surgeon: Sanda Klein, MD;  Location: [facility];  Service: Cardiovascular;  Laterality: N/A;  . CARDIOVERSION N/A 04/30/2018   Procedure: CARDIOVERSION;  Surgeon: Thayer Headings, MD;  Location[facility] END[facility]COPY;  Service: Cardiovascular;  Laterality: N/A;  . CATARACT EXTRACTION W/PHACO Right 08/14/2016   Procedure: CATARACT EXTRACTION PHACO AND INTRAOCULAR LENS PLACEMENT RIGHT EYE CDE 10.53;  Surgeon: Tonny Branch, MD;  Location: [facility];  Service: Ophthalmology;  Laterality: Right;  right  . CATARACT EXTRACTION W/PHACO Left 09/25/2016   Procedure: CATARACT EXTRACTION PHACO AND INTRAOCULAR LENS PLACEMENT (IOC);  Surgeon: Tonny Branch, MD;  Location: [facility];  Service: Ophthalmology;  Laterality: Left;  left CDE 8.81  . COLONOSCOPY     [facility]: cattered sigmoid diverticula, internal hemorrhoids, sessile polyp in ascending and mid transverse colon (tubular adenoma).   . colonoscopy with polypectomy  2015  . ESOPHAGOGASTRODUODENOSCOPY     [facility]: bile reflux in stomach, mild gastritis, hiatal hernia  . ESOPHAGOGASTRODUODENOSCOPY N/A 01/31/2016   Procedure: ESOPHAGOGASTRODUODENOSCOPY (EGD);  Surgeon: [name], MD;  [facility];  Service: Endoscopy;  Laterality: N/A;  1200  . IR FLUORO GUIDE PORT INSERTION RIGHT  06/30/2017  . IR US GUIDE VASC ACCESS RIGHT  06/30/2017  . LOBECTOMY Right 10/27/2012   Procedure: LOBECTOMY;  Surgeon: [name], MD;  Location: [facility];  Service: Thoracic;   Laterality: Right;   RIGHT UPPER LOBECTOMY & Node Dissection  . LYMPH NODE BIOPSY Right 05/01/2014   Procedure: LYMPH NODE BIOPSY, Right Axillary;  Surgeon: [name], MD;  Location: [facility];  Service: Thoracic;  Laterality: Right;  . PACEMAKER IMPLANT N/A 10/30/2017   SJM Assurity MRI PPM implanted by Dr [name] once leadless pacemaker battery depleted  . PARTIAL HYSTERECTOMY    . PERMANENT PACEMAKER INSERTION N/A 09/14/2012   Nanostim (SJM) leadless pacemaker (LEADLESS II STUDY PATIENT) implanted by Dr [name], no longer functioning, device abandoned.  [name] VIDEO ASSISTED THORACOSCOPY (VATS)/WEDGE RESECTION Right 10/27/2012   Procedure: VIDEO ASSISTED THORACOSCOPY (VATS),RGHT UPPER LOBE LUNG WEDGE RESECTION;  Surgeon: [name], MD;  Location: [facility];  Service: Thoracic;  Laterality: Right;    REVIEW OF SYSTEMS:  A comprehensive review of systems was negative except for: Constitutional: positive for fatigue Musculoskeletal: positive for neck pain   PHYSICAL EXAMINATION: General appearance: alert, cooperative and no distress Head: Normocephalic, without obvious abnormality, atraumatic Neck: no adenopathy, no JVD, supple, symmetrical, trachea midline and thyroid not enlarged, symmetric,  no tenderness/mass/nodules Lymph nodes: Cervical, supraclavicular, and axillary nodes normal. Resp: clear to auscultation bilaterally Back: symmetric, no curvature. ROM normal. No CVA tenderness. Cardio: regular rate and rhythm, S1, S2 normal, no murmur, click, rub or gallop GI: soft, non-tender; bowel sounds normal; no masses,  no organomegaly Extremities: extremities normal, atraumatic, no cyanosis or edema  ECOG PERFORMANCE STATUS: 1 - Symptomatic but completely ambulatory  Blood pressure (!) 160/82, pulse 74, temperature 98.5 F (36.9 C), temperature source Temporal, resp. rate 20, height 5\' 2"  (1.575 m), weight 151 lb 6.4 oz (68.7 kg), SpO2 98 %.  LABORATORY DATA: Lab Results  Component  Value Date   WBC 5.7 12/20/2019   HGB 14.6 12/20/2019   HCT 41.8 12/20/2019   MCV 95.2 12/20/2019   PLT 170 12/20/2019      Chemistry      Component Value Date/Time   NA 141 11/22/2019 1036   NA 139 08/05/2017 0902   K 4.2 11/22/2019 1036   K 4.1 08/05/2017 0902   CL 106 11/22/2019 1036   CO2 27 11/22/2019 1036   CO2 28 08/05/2017 0902   BUN 12 11/22/2019 1036   BUN 10.1 08/05/2017 0902   CREATININE 0.76 11/22/2019 1036   CREATININE 0.8 08/05/2017 0902   GLU 134 (H) 02/18/2017 1410      Component Value Date/Time   CALCIUM 9.3 11/22/2019 1036   CALCIUM 9.5 08/05/2017 0902   ALKPHOS 98 11/22/2019 1036   ALKPHOS 111 08/05/2017 0902   AST 32 11/22/2019 1036   AST 33 08/05/2017 0902   ALT 24 11/22/2019 1036   ALT 34 08/05/2017 0902   BILITOT 1.4 (H) 11/22/2019 1036   BILITOT 1.39 (H) 08/05/2017 0902       RADIOGRAPHIC STUDIES: No results found. ASSESSMENT AND PLAN:  This is a very pleasant 74 years old white female with metastatic non-small cell lung cancer, adenocarcinoma with no actionable mutations status post systemic chemotherapy with carboplatin and Alimta and she is currently undergoing treatment with second line immunotherapy with Nivolumab status post 6 cycles. This was followed by immunotherapy with Nivolumab 480 mg IV every 4 weeks status post 36 cycles. The patient continues to tolerate her treatment well with no concerning adverse effect except for mild fatigue. I recommended for her to proceed with cycle #37 today as planned. The patient will come back for follow-up visit in 4 weeks for evaluation before the next cycle of her treatment. She was advised to call immediately if she has any concerning symptoms in the interval. The patient voices understanding of current disease status and treatment options and is in agreement with the current care plan. All questions were answered. The patient knows to call the clinic with any problems, questions or concerns. We  can certainly see the patient much sooner if necessary.  Disclaimer: This note was dictated with voice recognition software. Similar sounding words can inadvertently be transcribed and may not be corrected upon review.

## 2019-12-21 ENCOUNTER — Telehealth: Payer: Self-pay | Admitting: Internal Medicine

## 2019-12-21 NOTE — Telephone Encounter (Signed)
Scheduled per los. Called and confirmed appt

## 2019-12-28 ENCOUNTER — Other Ambulatory Visit: Payer: Self-pay

## 2019-12-28 ENCOUNTER — Telehealth: Payer: Self-pay

## 2019-12-28 ENCOUNTER — Telehealth: Payer: Self-pay | Admitting: Internal Medicine

## 2019-12-28 MED ORDER — DILTIAZEM HCL ER COATED BEADS 240 MG PO CP24: 240.0000 mg | ORAL_CAPSULE | Freq: Every day | ORAL | 2 refills | Status: DC

## 2019-12-28 NOTE — Telephone Encounter (Signed)
New Message:    Pt says she is having problem getting her Cartia XT. [facility] and [facility] said [facility] can get it until sometime in June, She wants to know what she she needs to do please.

## 2019-12-28 NOTE — Telephone Encounter (Signed)
Called pt to find out which pharmacies she preferred to use since [facility] unable to get her cartia xl until mid June. Left message on her vm asking her to call the office. [facility] is listed as being in [place] so I want to make sure that is where she would like me to look as far as finding another pharmacy.

## 2019-12-28 NOTE — Telephone Encounter (Signed)
I was able to contact the patient and let her know the [facility] in [place] would have her medication CARTIA XT in stock tomorrow after 4 pm. Pt is aware she can pick up her script at that time, pt verbalized understanding and thanked me for helping her. Pharmacy phone number [phone].

## 2019-12-31 ENCOUNTER — Emergency Department (HOSPITAL_COMMUNITY)
Admission: EM | Admit: 2019-12-31 | Discharge: 2019-12-31 | Disposition: A | Discharge status: Home / Self Care | Payer: Medicare Other | Attending: Emergency Medicine | Admitting: Emergency Medicine

## 2019-12-31 ENCOUNTER — Encounter (HOSPITAL_COMMUNITY): Payer: Self-pay | Admitting: *Deleted

## 2019-12-31 ENCOUNTER — Other Ambulatory Visit: Payer: Self-pay

## 2019-12-31 ENCOUNTER — Emergency Department (HOSPITAL_COMMUNITY): Payer: Medicare Other

## 2019-12-31 DIAGNOSIS — I4819 Other persistent atrial fibrillation: Secondary | ICD-10-CM | POA: Diagnosis not present

## 2019-12-31 DIAGNOSIS — R410 Disorientation, unspecified: Secondary | ICD-10-CM | POA: Insufficient documentation

## 2019-12-31 DIAGNOSIS — Z87891 Personal history of nicotine dependence: Secondary | ICD-10-CM | POA: Diagnosis not present

## 2019-12-31 DIAGNOSIS — J45909 Unspecified asthma, uncomplicated: Secondary | ICD-10-CM | POA: Insufficient documentation

## 2019-12-31 DIAGNOSIS — Z85118 Personal history of other malignant neoplasm of bronchus and lung: Secondary | ICD-10-CM | POA: Diagnosis not present

## 2019-12-31 DIAGNOSIS — Z7901 Long term (current) use of anticoagulants: Secondary | ICD-10-CM | POA: Insufficient documentation

## 2019-12-31 DIAGNOSIS — Z950 Presence of cardiac pacemaker: Secondary | ICD-10-CM | POA: Diagnosis not present

## 2019-12-31 DIAGNOSIS — R4182 Altered mental status, unspecified: Secondary | ICD-10-CM | POA: Diagnosis present

## 2019-12-31 LAB — CBC
HCT: 42.3 % (ref 36.0–46.0)
Hemoglobin: 14.3 g/dL (ref 12.0–15.0)
MCH: 32.7 pg (ref 26.0–34.0)
MCHC: 33.8 g/dL (ref 30.0–36.0)
MCV: 96.8 fL (ref 80.0–100.0)
Platelets: 217 10*3/uL (ref 150–400)
RBC: 4.37 MIL/uL (ref 3.87–5.11)
RDW: 12.7 % (ref 11.5–15.5)
WBC: 6.4 10*3/uL (ref 4.0–10.5)
nRBC: 0 % (ref 0.0–0.2)

## 2019-12-31 LAB — COMPREHENSIVE METABOLIC PANEL
ALT: 26 U/L (ref 0–44)
AST: 34 U/L (ref 15–41)
Albumin: 4.2 g/dL (ref 3.5–5.0)
Alkaline Phosphatase: 102 U/L (ref 38–126)
Anion gap: 9 (ref 5–15)
BUN: 13 mg/dL (ref 8–23)
CO2: 26 mmol/L (ref 22–32)
Calcium: 9.2 mg/dL (ref 8.9–10.3)
Chloride: 101 mmol/L (ref 98–111)
Creatinine, Ser: 0.72 mg/dL (ref 0.44–1.00)
GFR calc Af Amer: >60 mL/min (ref >60)
GFR calc non Af Amer: >60 mL/min (ref >60)
Glucose, Bld: 93 mg/dL (ref 70–99)
Potassium: 4 mmol/L (ref 3.5–5.1)
Sodium: 136 mmol/L (ref 135–145)
Total Bilirubin: 1.4 mg/dL — ABNORMAL HIGH (ref 0.3–1.2)
Total Protein: 7.4 g/dL (ref 6.5–8.1)

## 2019-12-31 LAB — URINALYSIS, ROUTINE W REFLEX MICROSCOPIC
Bilirubin Urine: NEGATIVE
Glucose, UA: NEGATIVE mg/dL
Hgb urine dipstick: NEGATIVE
Ketones, ur: NEGATIVE mg/dL
Leukocytes,Ua: NEGATIVE
Nitrite: NEGATIVE
Protein, ur: NEGATIVE mg/dL
Specific Gravity, Urine: 1.012 (ref 1.005–1.030)
pH: 6 (ref 5.0–8.0)

## 2019-12-31 NOTE — ED Triage Notes (Signed)
Pt confused since last night.  Possible UTI.  Pt denies burning on urination.  Pt denies any pain.  BP elevated past few days per family member.

## 2019-12-31 NOTE — ED Provider Notes (Signed)
[facility] EMERGENCY DEPARTMENT Provider Note   CSN: [id number] Arrival date & time: 12/31/19  1347     History Chief Complaint  Patient presents with  . Altered Mental Status    Nancy Hodges is a 74 y.o. female.  Patient is a 74 year old female with past medical history of lung cancer with prior surgery and chemotherapy, atrial fibrillation on Xarelto, asthma.  She is brought by her daughter for evaluation of confusion.  According to her daughter, she began to seem somewhat disoriented yesterday evening.  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  Patient denies to me she is experiencing any headache or other symptoms.  She is adamant that she is fine and does not want to be here.  Daughter is concerned that she may have a urinary tract infection although she is asymptomatic.  She denies any drug or alcohol use and denies any new medications or dose changes.  The history is provided by the patient.  Altered Mental Status Presenting symptoms: confusion   Severity:  Mild Most recent episode:  Yesterday Episode history:  Single Timing:  Constant Progression:  Improving Chronicity:  New Context: not head injury, not recent change in medication and not recent illness   Associated symptoms: no fever, no headaches and no slurred speech        Past Medical History:  Diagnosis Date  . Allergic rhinitis   . Anxiety   . Arthritis   . Asthma   . Atrial flutter ([place])   . Cholelithiases 09/26/2015  . Diverticulosis   . Encounter for antineoplastic immunotherapy 12/09/2016  . GERD (gastroesophageal reflux disease)   . H/O hiatal hernia   . History of blood transfusion   . History of chemotherapy   . History of kidney stones   . History of radiation therapy   . Internal hemorrhoid   . Lung cancer ([place])    a. Stage IB non-small cell carcinoma, s/p R VATS, wedge resection, RU lobectomy 10/2012.  [name]  Persistent atrial fibrillation ([facility])    a. anticoagulated with Xarelto  . Pneumonia 06/2016   [facility]  . Presence of permanent cardiac pacemaker   . Pulmonary nodule   . Tachycardia-bradycardia syndrome ([place])    a. 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    Patient Active Problem List   Diagnosis Date Noted  . Persistent atrial fibrillation   . Sick sinus syndrome ([place]) 10/30/2017  . Port-A-Cath in place 07/07/2017  . Seborrheic keratosis 05/12/2017  . Nausea and vomiting 02/19/2017  . Weakness 02/19/2017  . SIRS (systemic inflammatory response syndrome) ([place]) 02/19/2017  . Goals of care, counseling/discussion 12/09/2016  . Encounter for antineoplastic immunotherapy 12/09/2016  . Esophageal varices in other disease   . Hepatic cirrhosis ([place]) 01/09/2016  . Elevated LFTs 11/07/2015  . Cholelithiases 09/26/2015  . [place]   . GI bleed 07/29/2014  . Lung cancer, primary, with metastasis from lung to other site [facility]) 05/05/2014  . Arthralgia 10/05/2013  . Obstructive chronic bronchitis without exacerbation [place] A  02/11/2013  . Non-small cell carcinoma of right lung, stage 4 ([place]) 09/29/2012  . Tachycardia-bradycardia syndrome ([place]) 09/06/2012  . Paroxysmal atrial fibrillation [facility])     Past Surgical History:  Procedure Laterality Date  . CARDIOVERSION N/A 02/19/2018   Procedure: CARDIOVERSION;  Surgeon: Sanda Klein, MD;  Location: [facility] ENDOSCOPY;  Service: Cardiovascular;  Laterality: N/A;  . CARDIOVERSION N/A 04/30/2018   Procedure: CARDIOVERSION;  Surgeon: Acie Fredrickson Wonda Cheng, MD;  Location: [facility] ENDOSCOPY;  Service: Cardiovascular;  Laterality: N/A;  . CATARACT EXTRACTION W/PHACO Right 08/14/2016   Procedure: CATARACT EXTRACTION PHACO AND INTRAOCULAR LENS PLACEMENT RIGHT EYE CDE 10.53;  Surgeon: Tonny Branch, MD;  [facility];  Service: Ophthalmology;  Laterality: Right;  right  . CATARACT EXTRACTION W/PHACO Left 09/25/2016   Procedure: CATARACT EXTRACTION PHACO AND  INTRAOCULAR LENS PLACEMENT (IOC);  Surgeon: Tonny Branch, MD;  Location: [facility];  Service: Ophthalmology;  Laterality: Left;  left CDE 8.81  . COLONOSCOPY     [facility]: cattered sigmoid diverticula, internal hemorrhoids, sessile polyp in ascending and mid transverse colon (tubular adenoma).   . colonoscopy with polypectomy  2015  . ESOPHAGOGASTRODUODENOSCOPY     [facility]: bile reflux in stomach, mild gastritis, hiatal hernia  . ESOPHAGOGASTRODUODENOSCOPY N/A 01/31/2016   Procedure: ESOPHAGOGASTRODUODENOSCOPY (EGD);  Surgeon: [name], MD;  [facility];  Service: Endoscopy;  Laterality: N/A;  1200  . IR FLUORO GUIDE PORT INSERTION RIGHT  06/30/2017  . IR US GUIDE VASC ACCESS RIGHT  06/30/2017  . LOBECTOMY Right 10/27/2012   Procedure: LOBECTOMY;  Surgeon: [name], MD;  Location: [facility];  Service: Thoracic;  Laterality: Right;   RIGHT UPPER LOBECTOMY & Node Dissection  . LYMPH NODE BIOPSY Right 05/01/2014   Procedure: LYMPH NODE BIOPSY, Right Axillary;  Surgeon: [name], MD;  Location: [facility];  Service: Thoracic;  Laterality: Right;  . PACEMAKER IMPLANT N/A 10/30/2017   SJM Assurity MRI PPM implanted by Dr [name] once leadless pacemaker battery depleted  . PARTIAL HYSTERECTOMY    . PERMANENT PACEMAKER INSERTION N/A 09/14/2012   Nanostim (SJM) leadless pacemaker (LEADLESS II STUDY PATIENT) implanted by Dr [name], no longer functioning, device abandoned.  [name] VIDEO ASSISTED THORACOSCOPY (VATS)/WEDGE RESECTION Right 10/27/2012   Procedure: VIDEO ASSISTED THORACOSCOPY (VATS),RGHT UPPER LOBE LUNG WEDGE RESECTION;  Surgeon: [name], MD;  [facility];  Service: Thoracic;  Laterality: Right;     OB History    Gravida  1   Para  1   Term  1   Preterm      AB      Living        SAB      TAB      Ectopic      Multiple      Live Births              Family History  Problem Relation Age of Onset  . Heart disease Mother   .  Diabetes Mother   . Kidney cancer Mother   . Asthma Father   . Heart disease Father   . Pancreatic cancer Brother   . Diabetes Sister   . Lung cancer Sister   . Diabetes Brother   . Heart attack Son   . Colon cancer Neg Hx   . Breast cancer Neg Hx     Social History   Tobacco Use  . Smoking status: Former Smoker    Packs/day: 2.00    Years: 40.00    Pack years: 80.00    Types: Cigarettes    Quit date: 08/05/1995    Years since quitting: 24.4  . Smokeless tobacco: Never Used  Substance Use Topics  . Alcohol use: No  . Drug use: No    Home Medications Prior to Admission medications   Medication Sig Start Date End Date Taking? Authorizing Provider  acetaminophen (TYLENOL) 500 MG tablet Take 500  mg by mouth 3 (three) times daily as needed for moderate pain or fever.     [provider]  albuterol (PROVENTIL HFA;VENTOLIN HFA) 108 (90 BASE) MCG/ACT inhaler Inhale 2 puffs into the lungs every 6 (six) hours as needed for wheezing or shortness of breath.     [provider]  azelastine (ASTELIN) 0.1 % nasal spray Place 1 spray into both nostrils 2 (two) times daily. Use in each nostril as directed    [provider]  calcium carbonate (TUMS - DOSED IN MG ELEMENTAL CALCIUM) 500 MG chewable tablet Chew 1 tablet by mouth 2 (two) times daily as needed for indigestion or heartburn.     [provider]  diltiazem (CARTIA XT) 240 MG 24 hr capsule Take 1 capsule (240 mg total) by mouth daily. 12/28/19   Allred, Jeneen Rinks, MD  dimenhyDRINATE (DRAMAMINE) 50 MG tablet Take 12.5 mg by mouth every 8 (eight) hours as needed for dizziness.    [provider]  ipratropium-albuterol (DUONEB) 0.5-2.5 (3) MG/3ML SOLN Take 3 mLs by nebulization every 6 (six) hours as needed (shortness of breath or wheezing).     [provider]  lidocaine-prilocaine (EMLA) cream APPLY TOPICALLY TO TO THE AFFECTED AREA AS NEEDED 08/24/19   Curt Bears, MD  loratadine  (CLARITIN) 10 MG tablet Take 10 mg by mouth daily.    [provider]  OVER THE COUNTER MEDICATION Take 5 mLs by mouth daily as needed (cough). Hyland's kids cough syrup    [provider]  Polyvinyl Alcohol-Povidone (REFRESH OP) Place 1 drop into both eyes daily as needed (dry eyes).    [provider]  rivaroxaban (XARELTO) 20 MG TABS tablet TAKE 1 TABLET(20 MG) BY MOUTH DAILY WITH SUPPER 10/19/19   Allred, Jeneen Rinks, MD    Allergies    Chlorpheniramine-dm, Prednisone, Septra [sulfamethoxazole-trimethoprim], Vioxx [rofecoxib], and Penicillins  Review of Systems   Review of Systems  Constitutional: Negative for fever.  Neurological: Negative for headaches.  Psychiatric/Behavioral: Positive for confusion.  All other systems reviewed and are negative.   Physical Exam Updated Vital Signs BP (!) 159/69 (BP Location: Right Arm)   Pulse 83   Temp 98.1 F (36.7 C) (Oral)   Resp 14   Ht 5\' 2"  (1.575 m)   Wt 68.5 kg   SpO2 95%   BMI 27.62 kg/m   Physical Exam Vitals and nursing note reviewed.  Constitutional:      General: She is not in acute distress.    Appearance: She is well-developed. She is not diaphoretic.  HENT:     Head: Normocephalic and atraumatic.  Eyes:     Extraocular Movements: Extraocular movements intact.     Pupils: Pupils are equal, round, and reactive to light.  Cardiovascular:     Rate and Rhythm: Normal rate and regular rhythm.     Heart sounds: No murmur. No friction rub. No gallop.   Pulmonary:     Effort: Pulmonary effort is normal. No respiratory distress.     Breath sounds: Normal breath sounds. No wheezing.  Abdominal:     General: Bowel sounds are normal. There is no distension.     Palpations: Abdomen is soft.     Tenderness: There is no abdominal tenderness.  Musculoskeletal:        General: Normal range of motion.     Cervical back: Normal range of motion and neck supple.  Skin:    General: Skin is warm and dry.    Neurological:  General: No focal deficit present.     Mental Status: She is alert and oriented to person, place, and time.     Cranial Nerves: No cranial nerve deficit.     Sensory: No sensory deficit.     Motor: No weakness.     Coordination: Coordination normal.     Comments: Patient is awake and alert to person, place, time, and situation.  She appears neurologically intact with normal strength and coordination throughout all 4 extremities.  Cranial nerves are intact.  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     ED Results / Procedures / Treatments   Labs (all labs ordered are listed, but only abnormal results are displayed) Labs Reviewed  COMPREHENSIVE METABOLIC PANEL - Abnormal; Notable for the following components:      Result Value   Total Bilirubin 1.4 (*)    All other components within normal limits  CBC  URINALYSIS, ROUTINE W REFLEX MICROSCOPIC    EKG None  Radiology No results found.  Procedures Procedures (including critical care time)  Medications Ordered in ED Medications - No data to display  ED Course  I have reviewed the triage vital signs and the nursing notes.  Pertinent labs & imaging results that were available during my care of the patient were reviewed by me and considered in my medical decision making (see chart for details).    MDM Rules/Calculators/A&P  Patient brought by her daughter for evaluation of confusion and memory issues as described in the HPI.   Her vital signs are stable and physical examination is unremarkable.  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  She recalls birthdates, her address, and is alert to time, person, and situation.  Today's work-up shows no evidence of stroke on her CT scan, clear  urinalysis, and unremarkable laboratory studies.  I am uncertain as to the etiology of her memory issues, however this does not appear to be an acute stroke and her work-up is unremarkable.  At this point, I see no indication for admission.  I will make a referral to Dr. Merlene Laughter and have her follow-up there.  To return as needed for any problems.  Final Clinical Impression(s) / ED Diagnoses Final diagnoses:  None    Rx / DC Orders ED Discharge Orders    None       Veryl Speak, MD 12/31/19 1742

## 2019-12-31 NOTE — Discharge Instructions (Signed)
Continue medications as previously prescribed.    Follow-up in the neurology clinic with Dr. Merlene Laughter in the next week.  His contact information has been provided in this discharge summary for you to call on Tuesday and make these arrangements.  Return to the Emergency Department if you develop any new and/or worsening symptoms.

## 2020-01-04 ENCOUNTER — Telehealth: Payer: Self-pay | Admitting: Internal Medicine

## 2020-01-04 ENCOUNTER — Telehealth: Payer: Self-pay | Admitting: Medical Oncology

## 2020-01-04 NOTE — Telephone Encounter (Signed)
fyi- Confusion started 12/29/19- seen in ED. Discharged with f/u neurologist. CT done.    Dtr said she was "talking about a "magic store, could not remember her recipe, could not recall words, was agreeable , could not remember anything from the day before, cussing".  CT "IMPRESSION: Atrophy with patchy periventricular small vessel disease. No acute infarct. No mass or hemorrhage.  There are foci of arterial vascular calcification. There is mucosal thickening in multiple ethmoid air cells."

## 2020-01-04 NOTE — Telephone Encounter (Signed)
   Pt's daughter called, she said her Mother started to get confused and having hard time recalling her words. She said her pcp referred her to see Dr. Merlene Laughter neurologist. She said she just wanted to make sure that Dr. [name] is aware

## 2020-01-12 ENCOUNTER — Other Ambulatory Visit: Payer: Self-pay | Admitting: Neurology

## 2020-01-12 ENCOUNTER — Other Ambulatory Visit (HOSPITAL_COMMUNITY): Payer: Self-pay | Admitting: Neurology

## 2020-01-12 DIAGNOSIS — R4702 Dysphasia: Secondary | ICD-10-CM

## 2020-01-12 DIAGNOSIS — F801 Expressive language disorder: Secondary | ICD-10-CM

## 2020-01-17 ENCOUNTER — Telehealth: Payer: Self-pay | Admitting: Medical Oncology

## 2020-01-17 ENCOUNTER — Inpatient Hospital Stay: Payer: Medicare Other | Attending: Internal Medicine

## 2020-01-17 ENCOUNTER — Other Ambulatory Visit: Payer: Self-pay

## 2020-01-17 ENCOUNTER — Inpatient Hospital Stay (HOSPITAL_BASED_OUTPATIENT_CLINIC_OR_DEPARTMENT_OTHER): Payer: Medicare Other | Admitting: Internal Medicine

## 2020-01-17 ENCOUNTER — Inpatient Hospital Stay: Payer: Medicare Other

## 2020-01-17 ENCOUNTER — Encounter: Payer: Self-pay | Admitting: Internal Medicine

## 2020-01-17 VITALS — BP 139/86 | HR 65 | Temp 97.9°F | Resp 18 | Ht 62.0 in | Wt 148.9 lb

## 2020-01-17 DIAGNOSIS — C3491 Malignant neoplasm of unspecified part of right bronchus or lung: Secondary | ICD-10-CM | POA: Diagnosis not present

## 2020-01-17 DIAGNOSIS — C3490 Malignant neoplasm of unspecified part of unspecified bronchus or lung: Secondary | ICD-10-CM

## 2020-01-17 DIAGNOSIS — Z79899 Other long term (current) drug therapy: Secondary | ICD-10-CM | POA: Diagnosis not present

## 2020-01-17 DIAGNOSIS — C773 Secondary and unspecified malignant neoplasm of axilla and upper limb lymph nodes: Secondary | ICD-10-CM | POA: Diagnosis present

## 2020-01-17 DIAGNOSIS — E032 Hypothyroidism due to medicaments and other exogenous substances: Secondary | ICD-10-CM

## 2020-01-17 DIAGNOSIS — C3411 Malignant neoplasm of upper lobe, right bronchus or lung: Secondary | ICD-10-CM | POA: Diagnosis present

## 2020-01-17 DIAGNOSIS — Z5112 Encounter for antineoplastic immunotherapy: Secondary | ICD-10-CM | POA: Insufficient documentation

## 2020-01-17 LAB — CMP (CANCER CENTER ONLY)
ALT: 23 U/L (ref 0–44)
AST: 28 U/L (ref 15–41)
Albumin: 3.9 g/dL (ref 3.5–5.0)
Alkaline Phosphatase: 92 U/L (ref 38–126)
Anion gap: 8 (ref 5–15)
BUN: 9 mg/dL (ref 8–23)
CO2: 30 mmol/L (ref 22–32)
Calcium: 9.7 mg/dL (ref 8.9–10.3)
Chloride: 106 mmol/L (ref 98–111)
Creatinine: 0.81 mg/dL (ref 0.44–1.00)
GFR, Est AFR Am: >60 mL/min (ref >60)
GFR, Estimated: >60 mL/min (ref >60)
Glucose, Bld: 128 mg/dL — ABNORMAL HIGH (ref 70–99)
Potassium: 4.5 mmol/L (ref 3.5–5.1)
Sodium: 144 mmol/L (ref 135–145)
Total Bilirubin: 1.2 mg/dL (ref 0.3–1.2)
Total Protein: 6.8 g/dL (ref 6.5–8.1)

## 2020-01-17 LAB — CBC WITH DIFFERENTIAL (CANCER CENTER ONLY)
Abs Immature Granulocytes: 0.02 10*3/uL (ref 0.00–0.07)
Basophils Absolute: 0 10*3/uL (ref 0.0–0.1)
Basophils Relative: 1 %
Eosinophils Absolute: 0.1 10*3/uL (ref 0.0–0.5)
Eosinophils Relative: 2 %
HCT: 40.7 % (ref 36.0–46.0)
Hemoglobin: 14 g/dL (ref 12.0–15.0)
Immature Granulocytes: 0 %
Lymphocytes Relative: 23 %
Lymphs Abs: 1.2 10*3/uL (ref 0.7–4.0)
MCH: 33 pg (ref 26.0–34.0)
MCHC: 34.4 g/dL (ref 30.0–36.0)
MCV: 96 fL (ref 80.0–100.0)
Monocytes Absolute: 0.4 10*3/uL (ref 0.1–1.0)
Monocytes Relative: 8 %
Neutro Abs: 3.7 10*3/uL (ref 1.7–7.7)
Neutrophils Relative %: 66 %
Platelet Count: 164 10*3/uL (ref 150–400)
RBC: 4.24 MIL/uL (ref 3.87–5.11)
RDW: 13.3 % (ref 11.5–15.5)
WBC Count: 5.5 10*3/uL (ref 4.0–10.5)
nRBC: 0 % (ref 0.0–0.2)

## 2020-01-17 LAB — TSH: TSH: 3.445 u[IU]/mL (ref 0.350–4.500)

## 2020-01-17 MED ORDER — SODIUM CHLORIDE 0.9 % IV SOLN
480.0000 mg | Freq: Once | INTRAVENOUS | Status: AC
  Administered 2020-01-17: 480 mg via INTRAVENOUS
  Filled 2020-01-17: qty 48

## 2020-01-17 MED ORDER — SODIUM CHLORIDE 0.9% FLUSH
10.0000 mL | INTRAVENOUS | Status: DC | PRN
  Filled 2020-01-17: qty 10

## 2020-01-17 MED ORDER — HEPARIN SOD (PORK) LOCK FLUSH 100 UNIT/ML IV SOLN
500.0000 [IU] | Freq: Once | INTRAVENOUS | Status: DC | PRN
  Filled 2020-01-17: qty 5

## 2020-01-17 MED ORDER — SODIUM CHLORIDE 0.9 % IV SOLN
Freq: Once | INTRAVENOUS | Status: AC
  Filled 2020-01-17: qty 250

## 2020-01-17 NOTE — Progress Notes (Signed)
[facility] [phone]   [phone]  OFFICE PROGRESS NOTE  Harris, [name], [name] [address] [place] [address]  DIAGNOSIS: Metastatic non-small cell lung cancer, adenocarcinoma. This was initially diagnosed as stage IB (T2a, N0, M0) in March of 2014, status post right upper lobectomy with lymph node dissection but the patient has evidence for disease recurrence in the right axilla status post resection.  PRIOR THERAPY:  1) Status post right axillary lymph node biopsy on 05/01/2014 under the care of Dr. Roxan Hockey. 2) Systemic chemotherapy with carboplatin for AUC of 5 and Alimta 500 mg/M2 every 3 weeks. First cycle 05/31/2014. Status post 6 cycles. Last dose was given 09/22/2014. 3) palliative radiotherapy to the subcarinal lymph node under the care of Dr. Sondra Come. 4) Nivolumab 240 mg IV every 2 weeks. First dose 12/23/2016. Status post 6 cycles.  CURRENT THERAPY:  Nivolumab 480 mg IV every 4 weeks. First dose 03/17/2017. Status post 37 cycles.  INTERVAL HISTORY: Nancy Hodges 74 y.o. female returns to the clinic today for follow-up visit.  The patient is feeling fine today with no concerning complaints.  She was seen recently by neurology for evaluation of mental status change and confusion.  She had CT scan of the head that was unremarkable.  The patient denied having any current chest pain, shortness of breath, cough or hemoptysis.  She denied having any fever or chills.  She has no nausea, vomiting, diarrhea or constipation.  She denied having any headache or visual changes.  She is here today for evaluation before starting cycle #8 of her treatment.  MEDICAL HISTORY: Past Medical History:  Diagnosis Date  . Allergic rhinitis   . Anxiety   . Arthritis   . Asthma   . Atrial flutter ([place])   . Cholelithiases 09/26/2015  . Diverticulosis   . Encounter for antineoplastic immunotherapy 12/09/2016  . GERD (gastroesophageal reflux  disease)   . H/O hiatal hernia   . History of blood transfusion   . History of chemotherapy   . History of kidney stones   . History of radiation therapy   . Internal hemorrhoid   . Lung cancer ([place])    a. Stage IB non-small cell carcinoma, s/p R VATS, wedge resection, RU lobectomy 10/2012.  [name] Persistent atrial fibrillation ([facility])    a. anticoagulated with Xarelto  . Pneumonia 06/2016   [facility]  . Presence of permanent cardiac pacemaker   . Pulmonary nodule   . Tachycardia-bradycardia syndrome ([place])    a. 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    ALLERGIES:  is allergic to chlorpheniramine-dm, prednisone, septra [sulfamethoxazole-trimethoprim], vioxx [rofecoxib], and penicillins.  MEDICATIONS:  Current Outpatient Medications  Medication Sig Dispense Refill  . acetaminophen (TYLENOL) 500 MG tablet Take 500 mg by mouth 3 (three) times daily as needed for moderate pain or fever.     [name] albuterol (PROVENTIL HFA;VENTOLIN HFA) 108 (90 BASE) MCG/ACT inhaler Inhale 2 puffs into the lungs every 6 (six) hours as needed for wheezing or shortness of breath.     [name] azelastine (ASTELIN) 0.1 % nasal spray Place 1 spray into both nostrils 2 (two) times daily. Use in each nostril as directed    . calcium carbonate (TUMS - DOSED IN MG ELEMENTAL CALCIUM) 500 MG chewable tablet Chew 1 tablet by mouth 2 (two) times daily as needed for indigestion or heartburn.     . diltiazem (CARTIA XT) 240 MG 24 hr capsule Take 1  capsule (240 mg total) by mouth daily. 90 capsule 2  . dimenhyDRINATE (DRAMAMINE) 50 MG tablet Take 12.5 mg by mouth every 8 (eight) hours as needed for dizziness.    [name] ipratropium-albuterol (DUONEB) 0.5-2.5 (3) MG/3ML SOLN Take 3 mLs by nebulization every 6 (six) hours as needed (shortness of breath or wheezing).     [name] lidocaine-prilocaine (EMLA) cream APPLY TOPICALLY TO TO THE AFFECTED AREA AS NEEDED 30 g 4  . loratadine (CLARITIN) 10 MG tablet Take 10 mg by mouth daily.    [name] OVER THE  COUNTER MEDICATION Take 5 mLs by mouth daily as needed (cough). H[employer]'s kids cough syrup    . Polyvinyl Alcohol-Povidone (REFRESH OP) Place 1 drop into both eyes daily as needed (dry eyes).    . rivaroxaban (XARELTO) 20 MG TABS tablet TAKE 1 TABLET(20 MG) BY MOUTH DAILY WITH SUPPER 30 tablet 10   No current facility-administered medications for this visit.    SURGICAL HISTORY:  Past Surgical History:  Procedure Laterality Date  . CARDIOVERSION N/A 02/19/2018   Procedure: CARDIOVERSION;  Surgeon: Sanda Klein, MD;  Location: [facility] ENDOSCOPY;  Service: Cardiovascular;  Laterality: N/A;  . CARDIOVERSION N/A 04/30/2018   Procedure: CARDIOVERSION;  Surgeon: Thayer Headings, MD;  Location: [facility] ENDOSCOPY;  Service: Cardiovascular;  Laterality: N/A;  . CATARACT EXTRACTION W/PHACO Right 08/14/2016   Procedure: CATARACT EXTRACTION PHACO AND INTRAOCULAR LENS PLACEMENT RIGHT EYE CDE 10.53;  Surgeon: Tonny Branch, MD;  Location: [facility];  Service: Ophthalmology;  Laterality: Right;  right  . CATARACT EXTRACTION W/PHACO Left 09/25/2016   Procedure: CATARACT EXTRACTION PHACO AND INTRAOCULAR LENS PLACEMENT (IOC);  Surgeon: Tonny Branch, MD;  Location: [facility];  Service: Ophthalmology;  Laterality: Left;  left CDE 8.81  . COLONOSCOPY     [facility]: cattered sigmoid diverticula, internal hemorrhoids, sessile polyp in ascending and mid transverse colon (tubular adenoma).   . colonoscopy with polypectomy  2015  . ESOPHAGOGASTRODUODENOSCOPY     [facility]: bile reflux in stomach, mild gastritis, hiatal hernia  . ESOPHAGOGASTRODUODENOSCOPY N/A 01/31/2016   Procedure: ESOPHAGOGASTRODUODENOSCOPY (EGD);  Surgeon: [name], MD;  [facility];  Service: Endoscopy;  Laterality: N/A;  1200  . IR FLUORO GUIDE PORT INSERTION RIGHT  06/30/2017  . IR US GUIDE VASC ACCESS RIGHT  06/30/2017  . LOBECTOMY Right 10/27/2012   Procedure: LOBECTOMY;  Surgeon: [name], MD;  Location: [facility];  Service: Thoracic;   Laterality: Right;   RIGHT UPPER LOBECTOMY & Node Dissection  . LYMPH NODE BIOPSY Right 05/01/2014   Procedure: LYMPH NODE BIOPSY, Right Axillary;  Surgeon: [name], MD;  Location: [facility];  Service: Thoracic;  Laterality: Right;  . PACEMAKER IMPLANT N/A 10/30/2017   SJM Assurity MRI PPM implanted by Dr [name] once leadless pacemaker battery depleted  . PARTIAL HYSTERECTOMY    . PERMANENT PACEMAKER INSERTION N/A 09/14/2012   Nanostim (SJM) leadless pacemaker (LEADLESS II STUDY PATIENT) implanted by Dr [name], no longer functioning, device abandoned.  [name] VIDEO ASSISTED THORACOSCOPY (VATS)/WEDGE RESECTION Right 10/27/2012   Procedure: VIDEO ASSISTED THORACOSCOPY (VATS),RGHT UPPER LOBE LUNG WEDGE RESECTION;  Surgeon: [name], MD;  Location: [facility];  Service: Thoracic;  Laterality: Right;    REVIEW OF SYSTEMS:  A comprehensive review of systems was negative except for: Constitutional: positive for fatigue   PHYSICAL EXAMINATION: General appearance: alert, cooperative and no distress Head: Normocephalic, without obvious abnormality, atraumatic Neck: no adenopathy, no JVD, supple, symmetrical, trachea midline and thyroid not enlarged, symmetric,  no tenderness/mass/nodules Lymph nodes: Cervical, supraclavicular, and axillary nodes normal. Resp: clear to auscultation bilaterally Back: symmetric, no curvature. ROM normal. No CVA tenderness. Cardio: regular rate and rhythm, S1, S2 normal, no murmur, click, rub or gallop GI: soft, non-tender; bowel sounds normal; no masses,  no organomegaly Extremities: extremities normal, atraumatic, no cyanosis or edema  ECOG PERFORMANCE STATUS: 1 - Symptomatic but completely ambulatory  Blood pressure 139/86, pulse 65, temperature 97.9 F (36.6 C), temperature source Temporal, resp. rate 18, height 5\' 2"  (1.575 m), weight 148 lb 14.4 oz (67.5 kg), SpO2 98 %.  LABORATORY DATA: Lab Results  Component Value Date   WBC 5.5 01/17/2020   HGB 14.0  01/17/2020   HCT 40.7 01/17/2020   MCV 96.0 01/17/2020   PLT 164 01/17/2020      Chemistry      Component Value Date/Time   NA 144 01/17/2020 0934   NA 139 08/05/2017 0902   K 4.5 01/17/2020 0934   K 4.1 08/05/2017 0902   CL 106 01/17/2020 0934   CO2 30 01/17/2020 0934   CO2 28 08/05/2017 0902   BUN 9 01/17/2020 0934   BUN 10.1 08/05/2017 0902   CREATININE 0.81 01/17/2020 0934   CREATININE 0.8 08/05/2017 0902   GLU 134 (H) 02/18/2017 1410      Component Value Date/Time   CALCIUM 9.7 01/17/2020 0934   CALCIUM 9.5 08/05/2017 0902   ALKPHOS 92 01/17/2020 0934   ALKPHOS 111 08/05/2017 0902   AST 28 01/17/2020 0934   AST 33 08/05/2017 0902   ALT 23 01/17/2020 0934   ALT 34 08/05/2017 0902   BILITOT 1.2 01/17/2020 0934   BILITOT 1.39 (H) 08/05/2017 0902       RADIOGRAPHIC STUDIES: CT Head Wo Contrast  Result Date: 12/31/2019 CLINICAL DATA:  Altered mental status/confusion. History of lung carcinoma EXAM: CT HEAD WITHOUT CONTRAST TECHNIQUE: Contiguous axial images were obtained from the base of the skull through the vertex without intravenous contrast. COMPARISON:  August 06, 2015 FINDINGS: Brain: There is mild diffuse atrophy. There is no intracranial mass, hemorrhage, extra-axial fluid collection, or midline shift. There is patchy small vessel disease in the centra semiovale bilaterally, somewhat increased from the previous study. No acute appearing infarct evident. Vascular: No hyperdense vessel. Calcification noted in each carotid siphon region. Skull: The bony calvarium a appears intact. Sinuses/Orbits: There is mucosal thickening and opacification involving multiple ethmoid air cells. Other visualized paranasal sinuses are clear. Visualized orbits appear symmetric bilaterally. Other: Mastoid air cells are clear. IMPRESSION: Atrophy with patchy periventricular small vessel disease. No acute infarct. No mass or hemorrhage. There are foci of arterial vascular calcification. There  is mucosal thickening in multiple ethmoid air cells. Electronically Signed   By: Lowella Grip III M.D.   On: 12/31/2019 16:50   ASSESSMENT AND PLAN:  This is a very pleasant 74 years old white female with metastatic non-small cell lung cancer, adenocarcinoma with no actionable mutations status post systemic chemotherapy with carboplatin and Alimta and she is currently undergoing treatment with second line immunotherapy with Nivolumab status post 6 cycles. This was followed by immunotherapy with Nivolumab 480 mg IV every 4 weeks status post 37 cycles. She has been tolerating this treatment well with no concerning adverse effects. I recommended for the patient to proceed with cycle #38 today as planned. I will see her back for follow-up visit in 4 weeks for evaluation with repeat CT scan of the chest, abdomen pelvis for restaging of her disease. The  patient was advised to call immediately if she has any concerning symptoms in the interval. The patient voices understanding of current disease status and treatment options and is in agreement with the current care plan. All questions were answered. The patient knows to call the clinic with any problems, questions or concerns. We can certainly see the patient much sooner if necessary.  Disclaimer: This note was dictated with voice recognition software. Similar sounding words can inadvertently be transcribed and may not be corrected upon review.

## 2020-01-17 NOTE — Patient Instructions (Signed)
[facility] Discharge Instructions for Patients Receiving Chemotherapy  Today you received the following Immunotherapy agent: Nivolumab (Opdivo)  To help prevent nausea and vomiting after your treatment, we encourage you to take your nausea medication as directed by your MD   If you develop nausea and vomiting that is not controlled by your nausea medication, call the clinic.   BELOW ARE SYMPTOMS THAT SHOULD BE REPORTED IMMEDIATELY:  *FEVER GREATER THAN 100.5 F  *CHILLS WITH OR WITHOUT FEVER  NAUSEA AND VOMITING THAT IS NOT CONTROLLED WITH YOUR NAUSEA MEDICATION  *UNUSUAL SHORTNESS OF BREATH  *UNUSUAL BRUISING OR BLEEDING  TENDERNESS IN MOUTH AND THROAT WITH OR WITHOUT PRESENCE OF ULCERS  *URINARY PROBLEMS  *BOWEL PROBLEMS  UNUSUAL RASH Items with * indicate a potential emergency and should be followed up as soon as possible.  Feel free to call the clinic should you have any questions or concerns. The clinic phone number is [phone].  Please show the [place] at check-in to the Emergency Department and triage nurse.

## 2020-01-17 NOTE — Telephone Encounter (Signed)
Pt insists she needs labs on Tuesday 7/13. I tried to convey to her that she does not need labs because they will have been done on Monday 7/12 before her CT scan.  I could not convince her. This can be addressed on June 13th

## 2020-01-18 ENCOUNTER — Telehealth: Payer: Self-pay | Admitting: Internal Medicine

## 2020-01-18 ENCOUNTER — Telehealth: Payer: Self-pay | Admitting: Medical Oncology

## 2020-01-18 NOTE — Telephone Encounter (Signed)
LVM that port will be accessed 7/12 for Ct scan and labs done 7/13.

## 2020-01-18 NOTE — Telephone Encounter (Signed)
Scheduled appt per 6/15 sch message - unable to reach pt .left message with appt date and time

## 2020-01-19 ENCOUNTER — Telehealth: Payer: Self-pay

## 2020-01-19 NOTE — Telephone Encounter (Signed)
TC from Pt stating that she is not coming in for port flush appointment before her CT scan. Pt. She just wants to come for the CT scan. Pt. Informed that if previous lab work is not accepted CT will be delayed. Pt. Verbalized understanding.

## 2020-01-20 ENCOUNTER — Telehealth: Payer: Self-pay | Admitting: Internal Medicine

## 2020-01-20 NOTE — Telephone Encounter (Signed)
Scheduled per los. Called, not able to leave msg. Mailed printout  

## 2020-01-31 ENCOUNTER — Ambulatory Visit (INDEPENDENT_AMBULATORY_CARE_PROVIDER_SITE_OTHER): Payer: Medicare Other | Admitting: *Deleted

## 2020-01-31 DIAGNOSIS — I495 Sick sinus syndrome: Secondary | ICD-10-CM

## 2020-01-31 LAB — CUP PACEART REMOTE DEVICE CHECK
Battery Remaining Longevity: 119 mo
Battery Remaining Percentage: 95.5 %
Battery Voltage: 3.01 V
Brady Statistic AP VP Percent: 1 %
Brady Statistic AP VS Percent: 1 %
Brady Statistic AS VP Percent: 1 %
Brady Statistic AS VS Percent: 99 %
Brady Statistic RA Percent Paced: 1 %
Brady Statistic RV Percent Paced: 21 %
Date Time Interrogation Session: 20210629020013
Implantable Lead Implant Date: 20190329
Implantable Lead Implant Date: 20190329
Implantable Lead Location: 753859
Implantable Lead Location: 753860
Implantable Pulse Generator Implant Date: 20190329
Lead Channel Impedance Value: 390 Ohm
Lead Channel Impedance Value: 480 Ohm
Lead Channel Pacing Threshold Amplitude: 1 V
Lead Channel Pacing Threshold Amplitude: 1 V
Lead Channel Pacing Threshold Pulse Width: 0.5 ms
Lead Channel Pacing Threshold Pulse Width: 0.5 ms
Lead Channel Sensing Intrinsic Amplitude: 1.9 mV
Lead Channel Sensing Intrinsic Amplitude: 12 mV
Lead Channel Setting Pacing Amplitude: 2 V
Lead Channel Setting Pacing Amplitude: 2.5 V
Lead Channel Setting Pacing Pulse Width: 0.5 ms
Lead Channel Setting Sensing Sensitivity: 2 mV
Pulse Gen Model: 2272
Pulse Gen Serial Number: 9003317

## 2020-02-02 ENCOUNTER — Other Ambulatory Visit (HOSPITAL_COMMUNITY): Payer: Medicare Other

## 2020-02-02 NOTE — Progress Notes (Signed)
Remote pacemaker transmission.   

## 2020-02-03 ENCOUNTER — Other Ambulatory Visit: Payer: Self-pay

## 2020-02-03 ENCOUNTER — Ambulatory Visit (HOSPITAL_COMMUNITY)
Admission: RE | Admit: 2020-02-03 | Discharge: 2020-02-03 | Disposition: A | Discharge status: Home / Self Care | Payer: Medicare Other | Source: Ambulatory Visit | Attending: Neurology | Admitting: Neurology

## 2020-02-03 ENCOUNTER — Encounter: Payer: Medicare Other | Admitting: Internal Medicine

## 2020-02-03 DIAGNOSIS — F801 Expressive language disorder: Secondary | ICD-10-CM | POA: Diagnosis present

## 2020-02-03 DIAGNOSIS — R4702 Dysphasia: Secondary | ICD-10-CM

## 2020-02-08 ENCOUNTER — Other Ambulatory Visit: Payer: Self-pay

## 2020-02-08 ENCOUNTER — Ambulatory Visit (HOSPITAL_COMMUNITY)
Admission: RE | Admit: 2020-02-08 | Discharge: 2020-02-08 | Disposition: A | Discharge status: Home / Self Care | Payer: Medicare Other | Source: Ambulatory Visit | Attending: Neurology | Admitting: Neurology

## 2020-02-08 DIAGNOSIS — R4182 Altered mental status, unspecified: Secondary | ICD-10-CM | POA: Diagnosis present

## 2020-02-08 NOTE — Progress Notes (Addendum)
OP EEG completed at [facility].  Results pending.

## 2020-02-08 NOTE — Procedures (Signed)
ELECTROENCEPHALOGRAM REPORT   Patient: Nancy Hodges       Room #: OP EEG No. ID: [id number] Age: 74 y.o.        Sex: female Requesting Physician: [place] Report Date:  02/08/2020        Interpreting Physician: Alexis Goodell  History: Nancy Hodges is an 74 y.o. female with recent mental status change  Medications:  Unknown  Conditions of Recording:  This is a 21 channel routine scalp EEG performed with bipolar and monopolar montages arranged in accordance to the international 10/20 system of electrode placement. One channel was dedicated to EKG recording.  The patient is in the awake state.  Description:  The waking background activity consists of a low voltage, symmetrical, fairly well organized, 9 Hz alpha activity, seen from the parieto-occipital and posterior temporal regions.  Low voltage fast activity, poorly organized, is seen anteriorly and is at times superimposed on more posterior regions.  A mixture of theta and alpha rhythms are seen from the central and temporal regions. The patient does not drowse or sleep. Hyperventilation and intermittent photic stimulation were not performed.  IMPRESSION: Normal awake electroencephalogram. There are no focal lateralizing or epileptiform features.   Alexis Goodell, MD Neurology [phone] 02/08/2020, 11:31 AM

## 2020-02-10 ENCOUNTER — Encounter: Payer: Medicare Other | Admitting: Internal Medicine

## 2020-02-13 ENCOUNTER — Encounter (HOSPITAL_COMMUNITY): Payer: Self-pay

## 2020-02-13 ENCOUNTER — Inpatient Hospital Stay: Payer: Medicare Other | Attending: Internal Medicine

## 2020-02-13 ENCOUNTER — Inpatient Hospital Stay: Payer: Medicare Other

## 2020-02-13 ENCOUNTER — Ambulatory Visit (HOSPITAL_COMMUNITY)
Admission: RE | Admit: 2020-02-13 | Discharge: 2020-02-13 | Disposition: A | Discharge status: Home / Self Care | Payer: Medicare Other | Source: Ambulatory Visit | Attending: Internal Medicine | Admitting: Internal Medicine

## 2020-02-13 ENCOUNTER — Other Ambulatory Visit: Payer: Self-pay

## 2020-02-13 DIAGNOSIS — Z79899 Other long term (current) drug therapy: Secondary | ICD-10-CM | POA: Insufficient documentation

## 2020-02-13 DIAGNOSIS — C773 Secondary and unspecified malignant neoplasm of axilla and upper limb lymph nodes: Secondary | ICD-10-CM | POA: Diagnosis present

## 2020-02-13 DIAGNOSIS — C3490 Malignant neoplasm of unspecified part of unspecified bronchus or lung: Secondary | ICD-10-CM | POA: Diagnosis present

## 2020-02-13 DIAGNOSIS — C3491 Malignant neoplasm of unspecified part of right bronchus or lung: Secondary | ICD-10-CM

## 2020-02-13 DIAGNOSIS — Z5112 Encounter for antineoplastic immunotherapy: Secondary | ICD-10-CM | POA: Diagnosis not present

## 2020-02-13 DIAGNOSIS — C3411 Malignant neoplasm of upper lobe, right bronchus or lung: Secondary | ICD-10-CM | POA: Diagnosis present

## 2020-02-13 DIAGNOSIS — E032 Hypothyroidism due to medicaments and other exogenous substances: Secondary | ICD-10-CM

## 2020-02-13 DIAGNOSIS — Z95828 Presence of other vascular implants and grafts: Secondary | ICD-10-CM

## 2020-02-13 LAB — CMP (CANCER CENTER ONLY)
ALT: 31 U/L (ref 0–44)
AST: 39 U/L (ref 15–41)
Albumin: 4 g/dL (ref 3.5–5.0)
Alkaline Phosphatase: 117 U/L (ref 38–126)
Anion gap: 10 (ref 5–15)
BUN: 8 mg/dL (ref 8–23)
CO2: 27 mmol/L (ref 22–32)
Calcium: 9.8 mg/dL (ref 8.9–10.3)
Chloride: 103 mmol/L (ref 98–111)
Creatinine: 0.76 mg/dL (ref 0.44–1.00)
GFR, Est AFR Am: >60 mL/min (ref >60)
GFR, Estimated: >60 mL/min (ref >60)
Glucose, Bld: 122 mg/dL — ABNORMAL HIGH (ref 70–99)
Potassium: 4.6 mmol/L (ref 3.5–5.1)
Sodium: 140 mmol/L (ref 135–145)
Total Bilirubin: 1.8 mg/dL — ABNORMAL HIGH (ref 0.3–1.2)
Total Protein: 7.2 g/dL (ref 6.5–8.1)

## 2020-02-13 LAB — CBC WITH DIFFERENTIAL (CANCER CENTER ONLY)
Abs Immature Granulocytes: 0.02 10*3/uL (ref 0.00–0.07)
Basophils Absolute: 0 10*3/uL (ref 0.0–0.1)
Basophils Relative: 0 %
Eosinophils Absolute: 0.1 10*3/uL (ref 0.0–0.5)
Eosinophils Relative: 2 %
HCT: 41.3 % (ref 36.0–46.0)
Hemoglobin: 14.5 g/dL (ref 12.0–15.0)
Immature Granulocytes: 0 %
Lymphocytes Relative: 18 %
Lymphs Abs: 1.3 10*3/uL (ref 0.7–4.0)
MCH: 32.7 pg (ref 26.0–34.0)
MCHC: 35.1 g/dL (ref 30.0–36.0)
MCV: 93.2 fL (ref 80.0–100.0)
Monocytes Absolute: 0.5 10*3/uL (ref 0.1–1.0)
Monocytes Relative: 8 %
Neutro Abs: 5 10*3/uL (ref 1.7–7.7)
Neutrophils Relative %: 72 %
Platelet Count: 184 10*3/uL (ref 150–400)
RBC: 4.43 MIL/uL (ref 3.87–5.11)
RDW: 12.7 % (ref 11.5–15.5)
WBC Count: 7 10*3/uL (ref 4.0–10.5)
nRBC: 0 % (ref 0.0–0.2)

## 2020-02-13 LAB — TSH: TSH: 3.194 u[IU]/mL (ref 0.308–3.960)

## 2020-02-13 MED ORDER — HEPARIN SOD (PORK) LOCK FLUSH 100 UNIT/ML IV SOLN
INTRAVENOUS | Status: AC
  Filled 2020-02-13: qty 5

## 2020-02-13 MED ORDER — IOHEXOL 300 MG/ML  SOLN
100.0000 mL | Freq: Once | INTRAMUSCULAR | Status: AC | PRN
  Administered 2020-02-13: 100 mL via INTRAVENOUS

## 2020-02-13 MED ORDER — HEPARIN SOD (PORK) LOCK FLUSH 100 UNIT/ML IV SOLN
500.0000 [IU] | Freq: Once | INTRAVENOUS | Status: AC
  Administered 2020-02-13: 500 [IU] via INTRAVENOUS

## 2020-02-13 MED ORDER — SODIUM CHLORIDE 0.9% FLUSH
10.0000 mL | INTRAVENOUS | Status: DC | PRN
  Administered 2020-02-13: 10 mL via INTRAVENOUS
  Filled 2020-02-13: qty 10

## 2020-02-13 MED ORDER — SODIUM CHLORIDE (PF) 0.9 % IJ SOLN
INTRAMUSCULAR | Status: AC
  Filled 2020-02-13: qty 50

## 2020-02-14 ENCOUNTER — Inpatient Hospital Stay: Payer: Medicare Other

## 2020-02-14 ENCOUNTER — Other Ambulatory Visit: Payer: Self-pay

## 2020-02-14 ENCOUNTER — Inpatient Hospital Stay (HOSPITAL_BASED_OUTPATIENT_CLINIC_OR_DEPARTMENT_OTHER): Payer: Medicare Other | Admitting: Internal Medicine

## 2020-02-14 ENCOUNTER — Other Ambulatory Visit: Payer: Medicare Other

## 2020-02-14 ENCOUNTER — Telehealth: Payer: Self-pay | Admitting: Internal Medicine

## 2020-02-14 ENCOUNTER — Encounter: Payer: Self-pay | Admitting: Internal Medicine

## 2020-02-14 VITALS — BP 131/70 | HR 78 | Temp 97.8°F | Resp 18 | Ht 62.0 in | Wt 147.9 lb

## 2020-02-14 DIAGNOSIS — Z5112 Encounter for antineoplastic immunotherapy: Secondary | ICD-10-CM

## 2020-02-14 DIAGNOSIS — C3491 Malignant neoplasm of unspecified part of right bronchus or lung: Secondary | ICD-10-CM | POA: Diagnosis not present

## 2020-02-14 MED ORDER — SODIUM CHLORIDE 0.9 % IV SOLN
Freq: Once | INTRAVENOUS | Status: AC
  Filled 2020-02-14: qty 250

## 2020-02-14 MED ORDER — SODIUM CHLORIDE 0.9 % IV SOLN
480.0000 mg | Freq: Once | INTRAVENOUS | Status: AC
  Administered 2020-02-14: 480 mg via INTRAVENOUS
  Filled 2020-02-14: qty 48

## 2020-02-14 MED ORDER — SODIUM CHLORIDE 0.9% FLUSH
10.0000 mL | INTRAVENOUS | Status: DC | PRN
  Administered 2020-02-14: 10 mL
  Filled 2020-02-14: qty 10

## 2020-02-14 MED ORDER — HEPARIN SOD (PORK) LOCK FLUSH 100 UNIT/ML IV SOLN
500.0000 [IU] | Freq: Once | INTRAVENOUS | Status: AC | PRN
  Administered 2020-02-14: 500 [IU]
  Filled 2020-02-14: qty 5

## 2020-02-14 NOTE — Patient Instructions (Signed)
[facility] Discharge Instructions for Patients Receiving Chemotherapy  Today you received the following chemotherapy agents :  Opdivo.  To help prevent nausea and vomiting after your treatment, we encourage you to take your nausea medication as prescribed.   If you develop nausea and vomiting that is not controlled by your nausea medication, call the clinic.   BELOW ARE SYMPTOMS THAT SHOULD BE REPORTED IMMEDIATELY:  *FEVER GREATER THAN 100.5 F  *CHILLS WITH OR WITHOUT FEVER  NAUSEA AND VOMITING THAT IS NOT CONTROLLED WITH YOUR NAUSEA MEDICATION  *UNUSUAL SHORTNESS OF BREATH  *UNUSUAL BRUISING OR BLEEDING  TENDERNESS IN MOUTH AND THROAT WITH OR WITHOUT PRESENCE OF ULCERS  *URINARY PROBLEMS  *BOWEL PROBLEMS  UNUSUAL RASH Items with * indicate a potential emergency and should be followed up as soon as possible.  Feel free to call the clinic should you have any questions or concerns. The clinic phone number is [phone].  Please show the CHEMO ALERT CARD at check-in to the Emergency Department and triage nurse.   

## 2020-02-14 NOTE — Telephone Encounter (Signed)
Scheduled per 07/13 los, patient has received updated calender.

## 2020-02-14 NOTE — Progress Notes (Signed)
Nancy Hodges [phone]   [phone]  OFFICE PROGRESS NOTE  Harris, [name], [name] [address] [place] [address]  DIAGNOSIS: Metastatic non-small cell lung cancer, adenocarcinoma. This was initially diagnosed as stage IB (T2a, N0, M0) in March of 2014, status post right upper lobectomy with lymph node dissection but the patient has evidence for disease recurrence in the right axilla status post resection.  PRIOR THERAPY:  1) Status post right axillary lymph node biopsy on 05/01/2014 under the care of Dr. Roxan Hockey. 2) Systemic chemotherapy with carboplatin for AUC of 5 and Alimta 500 mg/M2 every 3 weeks. First cycle 05/31/2014. Status post 6 cycles. Last dose was given 09/22/2014. 3) palliative radiotherapy to the subcarinal lymph node under the care of Dr. Sondra Come. 4) Nivolumab 240 mg IV every 2 weeks. First dose 12/23/2016. Status post 6 cycles.  CURRENT THERAPY:  Nivolumab 480 mg IV every 4 weeks. First dose 03/17/2017. Status post 38 cycles.  INTERVAL HISTORY: Nancy Hodges 74 y.o. female returns to the clinic today for follow-up visit.  The patient is feeling fine today with no concerning complaints.  She denied having any recent chest pain, shortness of breath, cough or hemoptysis.  She continues to be very active and doing a lot of yard work.  She denied having any nausea, vomiting, diarrhea or constipation.  She has no headache or visual changes.  She has no recent weight loss or night sweats.  She had repeat CT scan of the chest, abdomen and pelvis she is here today for evaluation and discussion of her scan results.  MEDICAL HISTORY: Past Medical History:  Diagnosis Date  . Allergic rhinitis   . Anxiety   . Arthritis   . Asthma   . Atrial flutter ([place])   . Cholelithiases 09/26/2015  . Diverticulosis   . Encounter for antineoplastic immunotherapy 12/09/2016  . GERD (gastroesophageal reflux disease)   . H/O hiatal hernia     . History of blood transfusion   . History of chemotherapy   . History of kidney stones   . History of radiation therapy   . Internal hemorrhoid   . Lung cancer ([place])    a. Stage IB non-small cell carcinoma, s/p R VATS, wedge resection, RU lobectomy 10/2012.  [name] Persistent atrial fibrillation ([facility])    a. anticoagulated with Xarelto  . Pneumonia 06/2016   [facility]  . Presence of permanent cardiac pacemaker   . Pulmonary nodule   . Tachycardia-bradycardia syndrome ([place])    a. 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    ALLERGIES:  is allergic to chlorpheniramine-dm, prednisone, septra [sulfamethoxazole-trimethoprim], vioxx [rofecoxib], and penicillins.  MEDICATIONS:  Current Outpatient Medications  Medication Sig Dispense Refill  . acetaminophen (TYLENOL) 500 MG tablet Take 500 mg by mouth 3 (three) times daily as needed for moderate pain or fever.     [name] albuterol (PROVENTIL HFA;VENTOLIN HFA) 108 (90 BASE) MCG/ACT inhaler Inhale 2 puffs into the lungs every 6 (six) hours as needed for wheezing or shortness of breath.     [name] azelastine (ASTELIN) 0.1 % nasal spray Place 1 spray into both nostrils 2 (two) times daily. Use in each nostril as directed    . calcium carbonate (TUMS - DOSED IN MG ELEMENTAL CALCIUM) 500 MG chewable tablet Chew 1 tablet by mouth 2 (two) times daily as needed for indigestion or heartburn.     . diltiazem (CARTIA XT) 240 MG 24 hr capsule Take 1  capsule (240 mg total) by mouth daily. 90 capsule 2  . dimenhyDRINATE (DRAMAMINE) 50 MG tablet Take 12.5 mg by mouth every 8 (eight) hours as needed for dizziness.    [name] ipratropium-albuterol (DUONEB) 0.5-2.5 (3) MG/3ML SOLN Take 3 mLs by nebulization every 6 (six) hours as needed (shortness of breath or wheezing).     [name] lidocaine-prilocaine (EMLA) cream APPLY TOPICALLY TO TO THE AFFECTED AREA AS NEEDED 30 g 4  . loratadine (CLARITIN) 10 MG tablet Take 10 mg by mouth daily.    [name] OVER THE COUNTER MEDICATION Take 5 mLs by  mouth daily as needed (cough). H[employer]'s kids cough syrup    . Polyvinyl Alcohol-Povidone (REFRESH OP) Place 1 drop into both eyes daily as needed (dry eyes).    . rivaroxaban (XARELTO) 20 MG TABS tablet TAKE 1 TABLET(20 MG) BY MOUTH DAILY WITH SUPPER 30 tablet 10   No current facility-administered medications for this visit.    SURGICAL HISTORY:  Past Surgical History:  Procedure Laterality Date  . CARDIOVERSION N/A 02/19/2018   Procedure: CARDIOVERSION;  Surgeon: Sanda Klein, MD;  Location: [facility] ENDOSCOPY;  Service: Cardiovascular;  Laterality: N/A;  . CARDIOVERSION N/A 04/30/2018   Procedure: CARDIOVERSION;  Surgeon: Thayer Headings, MD;  Location: [facility] ENDOSCOPY;  Service: Cardiovascular;  Laterality: N/A;  . CATARACT EXTRACTION W/PHACO Right 08/14/2016   Procedure: CATARACT EXTRACTION PHACO AND INTRAOCULAR LENS PLACEMENT RIGHT EYE CDE 10.53;  Surgeon: Tonny Branch, MD;  Location: [facility] ORS;  Service: Ophthalmology;  Laterality: Right;  right  . CATARACT EXTRACTION W/PHACO Left 09/25/2016   Procedure: CATARACT EXTRACTION PHACO AND INTRAOCULAR LENS PLACEMENT (IOC);  Surgeon: Tonny Branch, MD;  Location: [facility] ORS;  Service: Ophthalmology;  Laterality: Left;  left CDE 8.81  . COLONOSCOPY     [facility]: cattered sigmoid diverticula, internal hemorrhoids, sessile polyp in ascending and mid transverse colon (tubular adenoma).   . colonoscopy with polypectomy  2015  . ESOPHAGOGASTRODUODENOSCOPY     [facility]: bile reflux in stomach, mild gastritis, hiatal hernia  . ESOPHAGOGASTRODUODENOSCOPY N/A 01/31/2016   Procedure: ESOPHAGOGASTRODUODENOSCOPY (EGD);  Surgeon: [name], MD;  Location: [facility];  Service: Endoscopy;  Laterality: N/A;  1200  . IR FLUORO GUIDE PORT INSERTION RIGHT  06/30/2017  . IR US GUIDE VASC ACCESS RIGHT  06/30/2017  . LOBECTOMY Right 10/27/2012   Procedure: LOBECTOMY;  Surgeon: [name], MD;  Location: [facility];  Service: Thoracic;  Laterality: Right;   RIGHT UPPER  LOBECTOMY & Node Dissection  . LYMPH NODE BIOPSY Right 05/01/2014   Procedure: LYMPH NODE BIOPSY, Right Axillary;  Surgeon: [name], MD;  Location: [facility];  Service: Thoracic;  Laterality: Right;  . PACEMAKER IMPLANT N/A 10/30/2017   SJM Assurity MRI PPM implanted by Dr [name] once leadless pacemaker battery depleted  . PARTIAL HYSTERECTOMY    . PERMANENT PACEMAKER INSERTION N/A 09/14/2012   Nanostim (SJM) leadless pacemaker (LEADLESS II STUDY PATIENT) implanted by Dr [name], no longer functioning, device abandoned.  [name] VIDEO ASSISTED THORACOSCOPY (VATS)/WEDGE RESECTION Right 10/27/2012   Procedure: VIDEO ASSISTED THORACOSCOPY (VATS),RGHT UPPER LOBE LUNG WEDGE RESECTION;  Surgeon: [name], MD;  Location: [facility];  Service: Thoracic;  Laterality: Right;    REVIEW OF SYSTEMS:  Constitutional: positive for fatigue Eyes: negative Ears, nose, mouth, throat, and face: negative Respiratory: negative Cardiovascular: negative Gastrointestinal: negative Genitourinary:negative Integument/breast: negative Hematologic/lymphatic: negative Musculoskeletal:negative Neurological: negative Behavioral/Psych: negative Endocrine: negative Allergic/Immunologic: negative   PHYSICAL EXAMINATION: General appearance: alert, cooperative and no distress  Head: Normocephalic, without obvious abnormality, atraumatic Neck: no adenopathy, no JVD, supple, symmetrical, trachea midline and thyroid not enlarged, symmetric, no tenderness/mass/nodules Lymph nodes: Cervical, supraclavicular, and axillary nodes normal. Resp: clear to auscultation bilaterally Back: symmetric, no curvature. ROM normal. No CVA tenderness. Cardio: regular rate and rhythm, S1, S2 normal, no murmur, click, rub or gallop GI: soft, non-tender; bowel sounds normal; no masses,  no organomegaly Extremities: extremities normal, atraumatic, no cyanosis or edema Neurologic: Alert and oriented X 3, normal strength and tone. Normal  symmetric reflexes. Normal coordination and gait  ECOG PERFORMANCE STATUS: 1 - Symptomatic but completely ambulatory  Blood pressure 131/70, pulse 78, temperature 97.8 F (36.6 C), temperature source Temporal, resp. rate 18, height 5\' 2"  (1.575 m), weight 147 lb 14.4 oz (67.1 kg), SpO2 98 %.  LABORATORY DATA: Lab Results  Component Value Date   WBC 7.0 02/13/2020   HGB 14.5 02/13/2020   HCT 41.3 02/13/2020   MCV 93.2 02/13/2020   PLT 184 02/13/2020      Chemistry      Component Value Date/Time   NA 140 02/13/2020 0929   NA 139 08/05/2017 0902   K 4.6 02/13/2020 0929   K 4.1 08/05/2017 0902   CL 103 02/13/2020 0929   CO2 27 02/13/2020 0929   CO2 28 08/05/2017 0902   BUN 8 02/13/2020 0929   BUN 10.1 08/05/2017 0902   CREATININE 0.76 02/13/2020 0929   CREATININE 0.8 08/05/2017 0902   GLU 134 (H) 02/18/2017 1410      Component Value Date/Time   CALCIUM 9.8 02/13/2020 0929   CALCIUM 9.5 08/05/2017 0902   ALKPHOS 117 02/13/2020 0929   ALKPHOS 111 08/05/2017 0902   AST 39 02/13/2020 0929   AST 33 08/05/2017 0902   ALT 31 02/13/2020 0929   ALT 34 08/05/2017 0902   BILITOT 1.8 (H) 02/13/2020 0929   BILITOT 1.39 (H) 08/05/2017 0902       RADIOGRAPHIC STUDIES: CT HEAD WO CONTRAST  Result Date: 02/03/2020 CLINICAL DATA:  Expressive language disorder. Expressive dysphagia. Additional history provided by technologist: Patient reports one episode of "seeing three of something when it was only two." History of lung cancer. EXAM: CT HEAD WITHOUT CONTRAST TECHNIQUE: Contiguous axial images were obtained from the base of the skull through the vertex without intravenous contrast. COMPARISON:  Prior head CT 12/31/2019. FINDINGS: Brain: Stable, mild to moderate generalized parenchymal atrophy. Again demonstrated is ill-defined hypoattenuation within the cerebral white matter which is most notable in the left anterior frontal region. Findings are nonspecific, but consistent with chronic  small vessel ischemic disease. 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 There is no acute intracranial hemorrhage. No acute demarcated cortical infarct is identified. No extra-axial fluid collection. No evidence of intracranial mass. No midline shift. Vascular: No hyperdense vessel. Skull: Normal. Negative for fracture or focal lesion. Sinuses/Orbits: Visualized orbits show no acute finding. Mild ethmoid sinus mucosal thickening. No significant mastoid effusion. IMPRESSION: No CT evidence of acute intracranial abnormality. Stable mild-to-moderate generalized parenchymal atrophy with chronic small vessel ischemic disease. 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 Mild ethmoid sinus mucosal thickening. Electronically Signed   By: Kellie Simmering DO   On: 02/03/2020 20:58   CT Chest W Contrast  Result Date: 02/13/2020 CLINICAL DATA:  74 year old female with history of non-small cell lung cancer (adenocarcinoma originally diagnosed in March of 2014). Staging examination. EXAM: CT CHEST, ABDOMEN, AND PELVIS WITH CONTRAST TECHNIQUE: Multidetector CT imaging of the chest, abdomen and pelvis was  performed following the standard protocol during bolus administration of intravenous contrast. CONTRAST:  114mL OMNIPAQUE IOHEXOL 300 MG/ML  SOLN COMPARISON:  CT of the chest, abdomen and pelvis 11/17/2019. FINDINGS: CT CHEST FINDINGS Cardiovascular: Heart size is normal. There is no significant pericardial fluid, thickening or pericardial calcification. There is aortic atherosclerosis, as well as atherosclerosis of the great vessels of the mediastinum and the coronary arteries, including calcified atherosclerotic plaque in the left anterior descending, left circumflex and right coronary arteries. Left-sided pacemaker device in place with lead tips terminating in the right atrium and right ventricular apex. Right internal jugular single-lumen porta cath with tip terminating in the right atrium.  Mediastinum/Nodes: No pathologically enlarged mediastinal or hilar lymph nodes. Please note that accurate exclusion of hilar adenopathy is limited on noncontrast CT scans. Esophagus is unremarkable in appearance. No axillary lymphadenopathy. Lungs/Pleura: Status post right upper lobectomy. Compensatory hyperexpansion of the right middle and lower lobes. Fibrotic changes are noted in the medial aspect of the right lower lobe, presumably related to prior radiation therapy. Mild pleural thickening in the lateral aspect of the right hemithorax, stable compared to prior examinations. No suspicious appearing pulmonary nodules or masses are noted. No acute consolidative airspace disease. No pleural effusions. Musculoskeletal: There are no aggressive appearing lytic or blastic lesions noted in the visualized portions of the skeleton. CT ABDOMEN PELVIS FINDINGS Hepatobiliary: Liver has a shrunken appearance and nodular contour, indicative of cirrhosis. No suspicious cystic or solid hepatic lesions. No intra or extrahepatic biliary ductal dilatation. Numerous tiny calcified gallstones are lying dependently in the gallbladder, without findings to suggest an acute cholecystitis at this time. Pancreas: No pancreatic mass. No pancreatic ductal dilatation. No pancreatic or peripancreatic fluid collections or inflammatory changes. Spleen: Well-defined low-intermediate attenuation lesion in the inferior aspect of the spleen, incompletely characterized but similar to the prior study and statistically likely to represent a small mildly proteinaceous cyst or other benign lesion. Small splenule inferior to the spleen incidentally noted. Adrenals/Urinary Tract: Multiple areas of parenchymal thinning in both kidneys, presumably post infectious scarring. No suspicious renal lesions. No hydroureteronephrosis. Urinary bladder is normal in appearance. Stomach/Bowel: Normal appearance of the stomach. No pathologic dilatation of small bowel or  colon. Numerous colonic diverticulae are noted, particularly in the sigmoid colon, without surrounding inflammatory changes to suggest an acute diverticulitis at this time. Normal appendix. Vascular/Lymphatic: Aortic atherosclerosis, without evidence of aneurysm or dissection in the abdominal or pelvic vasculature. No lymphadenopathy noted in the abdomen or pelvis. Reproductive: Status post hysterectomy.  Ovaries are atrophic. Other: No significant volume of ascites.  No pneumoperitoneum. Musculoskeletal: There are no aggressive appearing lytic or blastic lesions noted in the visualized portions of the skeleton. IMPRESSION: 1. No findings to suggest recurrent or metastatic disease in the chest, abdomen or pelvis. 2. Morphologic changes in the liver indicative of underlying cirrhosis. 3. Cholelithiasis without evidence of acute cholecystitis at this time. 4. Colonic diverticulosis without evidence of acute diverticulitis at this time 5. Aortic atherosclerosis, in addition to 3 vessel coronary artery disease. Assessment for potential risk factor modification, dietary therapy or pharmacologic therapy may be warranted, if clinically indicated. 6. Additional incidental findings, as above. Electronically Signed   By: Vinnie Langton M.D.   On: 02/13/2020 11:43   CT Abdomen Pelvis W Contrast  Result Date: 02/13/2020 CLINICAL DATA:  74 year old female with history of non-small cell lung cancer (adenocarcinoma originally diagnosed in March of 2014). Staging examination. EXAM: CT CHEST, ABDOMEN, AND PELVIS WITH CONTRAST TECHNIQUE: Multidetector CT  imaging of the chest, abdomen and pelvis was performed following the standard protocol during bolus administration of intravenous contrast. CONTRAST:  138mL OMNIPAQUE IOHEXOL 300 MG/ML  SOLN COMPARISON:  CT of the chest, abdomen and pelvis 11/17/2019. FINDINGS: CT CHEST FINDINGS Cardiovascular: Heart size is normal. There is no significant pericardial fluid, thickening or  pericardial calcification. There is aortic atherosclerosis, as well as atherosclerosis of the great vessels of the mediastinum and the coronary arteries, including calcified atherosclerotic plaque in the left anterior descending, left circumflex and right coronary arteries. Left-sided pacemaker device in place with lead tips terminating in the right atrium and right ventricular apex. Right internal jugular single-lumen porta cath with tip terminating in the right atrium. Mediastinum/Nodes: No pathologically enlarged mediastinal or hilar lymph nodes. Please note that accurate exclusion of hilar adenopathy is limited on noncontrast CT scans. Esophagus is unremarkable in appearance. No axillary lymphadenopathy. Lungs/Pleura: Status post right upper lobectomy. Compensatory hyperexpansion of the right middle and lower lobes. Fibrotic changes are noted in the medial aspect of the right lower lobe, presumably related to prior radiation therapy. Mild pleural thickening in the lateral aspect of the right hemithorax, stable compared to prior examinations. No suspicious appearing pulmonary nodules or masses are noted. No acute consolidative airspace disease. No pleural effusions. Musculoskeletal: There are no aggressive appearing lytic or blastic lesions noted in the visualized portions of the skeleton. CT ABDOMEN PELVIS FINDINGS Hepatobiliary: Liver has a shrunken appearance and nodular contour, indicative of cirrhosis. No suspicious cystic or solid hepatic lesions. No intra or extrahepatic biliary ductal dilatation. Numerous tiny calcified gallstones are lying dependently in the gallbladder, without findings to suggest an acute cholecystitis at this time. Pancreas: No pancreatic mass. No pancreatic ductal dilatation. No pancreatic or peripancreatic fluid collections or inflammatory changes. Spleen: Well-defined low-intermediate attenuation lesion in the inferior aspect of the spleen, incompletely characterized but similar to  the prior study and statistically likely to represent a small mildly proteinaceous cyst or other benign lesion. Small splenule inferior to the spleen incidentally noted. Adrenals/Urinary Tract: Multiple areas of parenchymal thinning in both kidneys, presumably post infectious scarring. No suspicious renal lesions. No hydroureteronephrosis. Urinary bladder is normal in appearance. Stomach/Bowel: Normal appearance of the stomach. No pathologic dilatation of small bowel or colon. Numerous colonic diverticulae are noted, particularly in the sigmoid colon, without surrounding inflammatory changes to suggest an acute diverticulitis at this time. Normal appendix. Vascular/Lymphatic: Aortic atherosclerosis, without evidence of aneurysm or dissection in the abdominal or pelvic vasculature. No lymphadenopathy noted in the abdomen or pelvis. Reproductive: Status post hysterectomy.  Ovaries are atrophic. Other: No significant volume of ascites.  No pneumoperitoneum. Musculoskeletal: There are no aggressive appearing lytic or blastic lesions noted in the visualized portions of the skeleton. IMPRESSION: 1. No findings to suggest recurrent or metastatic disease in the chest, abdomen or pelvis. 2. Morphologic changes in the liver indicative of underlying cirrhosis. 3. Cholelithiasis without evidence of acute cholecystitis at this time. 4. Colonic diverticulosis without evidence of acute diverticulitis at this time 5. Aortic atherosclerosis, in addition to 3 vessel coronary artery disease. Assessment for potential risk factor modification, dietary therapy or pharmacologic therapy may be warranted, if clinically indicated. 6. Additional incidental findings, as above. Electronically Signed   By: Vinnie Langton M.D.   On: 02/13/2020 11:43   US Carotid Bilateral  Result Date: 02/03/2020 CLINICAL DATA:  74 year old female with a history of expressive dysphagia EXAM: BILATERAL CAROTID DUPLEX ULTRASOUND TECHNIQUE: Pearline Cables scale imaging,  color Doppler and duplex ultrasound  were performed of bilateral carotid and vertebral arteries in the neck. COMPARISON:  None. FINDINGS: Criteria: Quantification of carotid stenosis is based on velocity parameters that correlate the residual internal carotid diameter with NASCET-based stenosis levels, using the diameter of the distal internal carotid lumen as the denominator for stenosis measurement. The following velocity measurements were obtained: RIGHT ICA:  Systolic 161 cm/sec, Diastolic 18 cm/sec CCA:  59 cm/sec SYSTOLIC ICA/CCA RATIO:  1.9 ECA:  119 cm/sec LEFT ICA:  Systolic 096 cm/sec, Diastolic 15 cm/sec CCA:  93 cm/sec SYSTOLIC ICA/CCA RATIO:  1.2 ECA:  102 cm/sec Right Brachial SBP: Not acquired Left Brachial SBP: Not acquired RIGHT CAROTID ARTERY: No significant calcifications of the right common carotid artery. Intermediate waveform maintained. Moderate heterogeneous and partially calcified plaque at the right carotid bifurcation. No significant lumen shadowing. Low resistance waveform of the right ICA. No significant tortuosity. RIGHT VERTEBRAL ARTERY: Antegrade flow with low resistance waveform. LEFT CAROTID ARTERY: No significant calcifications of the left common carotid artery. Intermediate waveform maintained. Moderate heterogeneous and partially calcified plaque at the left carotid bifurcation. No significant lumen shadowing. Low resistance waveform of the left ICA. No significant tortuosity. LEFT VERTEBRAL ARTERY:  Antegrade flow with low resistance waveform. IMPRESSION: Color duplex indicates moderate heterogeneous and calcified plaque, with no hemodynamically significant stenosis by duplex criteria in the extracranial cerebrovascular circulation. Signed, Dulcy Fanny. Dellia Nims, RPVI [facility] Electronically Signed   By: Corrie Mckusick D.O.   On: 02/03/2020 14:26   EEG adult  Result Date: 02/08/2020 Alexis Goodell, MD     02/08/2020  11:33 AM ELECTROENCEPHALOGRAM REPORT Patient: Nancy Hodges       Room #: OP EEG No. ID: [id number] Age: 74 y.o.        Sex: female Requesting Physician: [place] Report Date:  02/08/2020       Interpreting Physician: Alexis Goodell History: Nancy Hodges is an 74 y.o. female with recent mental status change Medications: Unknown Conditions of Recording:  This is a 21 channel routine scalp EEG performed with bipolar and monopolar montages arranged in accordance to the international 10/20 system of electrode placement. One channel was dedicated to EKG recording. The patient is in the awake state. Description:  The waking background activity consists of a low voltage, symmetrical, fairly well organized, 9 Hz alpha activity, seen from the parieto-occipital and posterior temporal regions.  Low voltage fast activity, poorly organized, is seen anteriorly and is at times superimposed on more posterior regions.  A mixture of theta and alpha rhythms are seen from the central and temporal regions. The patient does not drowse or sleep. Hyperventilation and intermittent photic stimulation were not performed. IMPRESSION: Normal awake electroencephalogram. There are no focal lateralizing or epileptiform features. Alexis Goodell, MD Neurology [phone] 02/08/2020, 11:31 AM   CUP PACEART REMOTE DEVICE CHECK  Result Date: 01/31/2020 Scheduled remote reviewed. Normal device function. Known persistent AF, overall rates controlled. 1 VHR, AF/RVR at 23 minutes. OAC- Xarelto   Next remote 91 days- JBox, RN/CVRS  ASSESSMENT AND PLAN:  This is a very pleasant 74 years old white female with metastatic non-small cell lung cancer, adenocarcinoma with no actionable mutations status post systemic chemotherapy with carboplatin and Alimta and she is currently undergoing treatment with second line immunotherapy with Nivolumab status post 6 cycles. This was followed by immunotherapy with Nivolumab 480 mg IV every 4 weeks status post  38 cycles. The patient continues to tolerate her treatment with immunotherapy fairly well.  She had repeat CT scan of the chest, abdomen pelvis performed recently.  I personally and independently reviewed the scan with the patient.  Her scan showed no concerning findings for disease progression. I recommended for her to continue her current treatment with immunotherapy and she will proceed with cycle #39 today. She will come back for follow-up visit in 4 weeks for evaluation before the next cycle of her treatment. For the cardiac arrhythmia and hypertension she is followed by cardiology. The patient was advised to call immediately if she has any other concerning symptoms in the interval. The patient voices understanding of current disease status and treatment options and is in agreement with the current care plan. All questions were answered. The patient knows to call the clinic with any problems, questions or concerns. We can certainly see the patient much sooner if necessary.  Disclaimer: This note was dictated with voice recognition software. Similar sounding words can inadvertently be transcribed and may not be corrected upon review.

## 2020-02-14 NOTE — Progress Notes (Signed)
Per Dr. Julien Nordmann, okay to proceed with treatment with total bilirubin of 1.8.

## 2020-03-09 ENCOUNTER — Ambulatory Visit (INDEPENDENT_AMBULATORY_CARE_PROVIDER_SITE_OTHER): Payer: Medicare Other | Admitting: Internal Medicine

## 2020-03-09 VITALS — BP 136/70 | HR 61 | Ht 62.0 in | Wt 148.0 lb

## 2020-03-09 DIAGNOSIS — Z950 Presence of cardiac pacemaker: Secondary | ICD-10-CM

## 2020-03-09 DIAGNOSIS — I4819 Other persistent atrial fibrillation: Secondary | ICD-10-CM | POA: Diagnosis not present

## 2020-03-09 DIAGNOSIS — I495 Sick sinus syndrome: Secondary | ICD-10-CM

## 2020-03-09 LAB — CUP PACEART INCLINIC DEVICE CHECK
Battery Remaining Longevity: 134 mo
Battery Voltage: 2.99 V
Brady Statistic RA Percent Paced: 0.18 %
Brady Statistic RV Percent Paced: 22 %
Date Time Interrogation Session: 20210806102846
Implantable Lead Implant Date: 20190329
Implantable Lead Implant Date: 20190329
Implantable Lead Location: 753859
Implantable Lead Location: 753860
Implantable Pulse Generator Implant Date: 20190329
Lead Channel Impedance Value: 412.5 Ohm
Lead Channel Impedance Value: 525 Ohm
Lead Channel Pacing Threshold Amplitude: 0.75 V
Lead Channel Pacing Threshold Amplitude: 0.75 V
Lead Channel Pacing Threshold Pulse Width: 0.5 ms
Lead Channel Pacing Threshold Pulse Width: 0.5 ms
Lead Channel Sensing Intrinsic Amplitude: 12 mV
Lead Channel Sensing Intrinsic Amplitude: 2.6 mV
Lead Channel Setting Pacing Amplitude: 2 V
Lead Channel Setting Pacing Amplitude: 2.5 V
Lead Channel Setting Pacing Pulse Width: 0.5 ms
Lead Channel Setting Sensing Sensitivity: 2 mV
Pulse Gen Model: 2272
Pulse Gen Serial Number: 9003317

## 2020-03-09 NOTE — Patient Instructions (Signed)
Medication Instructions:  Continue all current medications.  Labwork: none  Testing/Procedures: none  Follow-Up: 1 year   Any Other Special Instructions Will Be Listed Below (If Applicable).  If you need a refill on your cardiac medications before your next appointment, please call your pharmacy.  

## 2020-03-09 NOTE — Progress Notes (Signed)
PCP: [name], [name]   Primary EP:  Dr Lillette Boxer Nancy Hodges is a 74 y.o. female who presents today for routine electrophysiology followup.  Since last being seen in our clinic, the patient reports doing very well.  She is active and mostly unaware of her afib.  She did have a slight stroke for which she has been evaluated by Dr Merlene Hodges.  Today, she denies symptoms of palpitations, chest pain, shortness of breath,  lower extremity edema, dizziness, presyncope, or syncope.  The patient is otherwise without complaint today.   Past Medical History:  Diagnosis Date  . Allergic rhinitis   . Anxiety   . Arthritis   . Asthma   . Atrial flutter ([place])   . Cholelithiases 09/26/2015  . Diverticulosis   . Encounter for antineoplastic immunotherapy 12/09/2016  . GERD (gastroesophageal reflux disease)   . H/O hiatal hernia   . History of blood transfusion   . History of chemotherapy   . History of kidney stones   . History of radiation therapy   . Internal hemorrhoid   . Lung cancer ([place])    a. Stage IB non-small cell carcinoma, s/p R VATS, wedge resection, RU lobectomy 10/2012.  Nancy Hodges Persistent atrial fibrillation ([facility])    a. anticoagulated with Xarelto  . Pneumonia 06/2016   [facility]  . Presence of permanent cardiac pacemaker   . Pulmonary nodule   . Tachycardia-bradycardia syndrome ([place])    a. 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   Past Surgical History:  Procedure Laterality Date  . CARDIOVERSION N/A 02/19/2018   Procedure: CARDIOVERSION;  Surgeon: Sanda Klein, MD;  Location: [facility] ENDOSCOPY;  Service: Cardiovascular;  Laterality: N/A;  . CARDIOVERSION N/A 04/30/2018   Procedure: CARDIOVERSION;  Surgeon: Nancy Headings, MD;  Location: [facility] ENDOSCOPY;  Service: Cardiovascular;  Laterality: N/A;  . CATARACT EXTRACTION W/PHACO Right 08/14/2016   Procedure: CATARACT EXTRACTION PHACO AND INTRAOCULAR LENS PLACEMENT RIGHT EYE CDE 10.53;  Surgeon: Nancy Branch, MD;   Location: [facility];  Service: Ophthalmology;  Laterality: Right;  right  . CATARACT EXTRACTION W/PHACO Left 09/25/2016   Procedure: CATARACT EXTRACTION PHACO AND INTRAOCULAR LENS PLACEMENT (IOC);  Surgeon: Nancy Branch, MD;  Location: [facility] ORS;  Service: Ophthalmology;  Laterality: Left;  left CDE 8.81  . COLONOSCOPY     [facility]: cattered sigmoid diverticula, internal hemorrhoids, sessile polyp in ascending and mid transverse colon (tubular adenoma).   . colonoscopy with polypectomy  2015  . ESOPHAGOGASTRODUODENOSCOPY     [facility]: bile reflux in stomach, mild gastritis, hiatal hernia  . ESOPHAGOGASTRODUODENOSCOPY N/A 01/31/2016   Procedure: ESOPHAGOGASTRODUODENOSCOPY (EGD);  Surgeon: Nancy Binder, MD;  Location: [facility];  Service: Endoscopy;  Laterality: N/A;  1200  . IR FLUORO GUIDE PORT INSERTION RIGHT  06/30/2017  . IR US GUIDE VASC ACCESS RIGHT  06/30/2017  . LOBECTOMY Right 10/27/2012   Procedure: LOBECTOMY;  Surgeon: Nancy Nakayama, MD;  Location: [facility];  Service: Thoracic;  Laterality: Right;   RIGHT UPPER LOBECTOMY & Node Dissection  . LYMPH NODE BIOPSY Right 05/01/2014   Procedure: LYMPH NODE BIOPSY, Right Axillary;  Surgeon: Nancy Nakayama, MD;  Location: [facility];  Service: Thoracic;  Laterality: Right;  . PACEMAKER IMPLANT N/A 10/30/2017   SJM Assurity MRI PPM implanted by Dr Nancy Hodges once leadless pacemaker battery depleted  . PARTIAL HYSTERECTOMY    . PERMANENT PACEMAKER INSERTION N/A 09/14/2012   Nanostim (SJM) leadless pacemaker (LEADLESS II STUDY PATIENT) implanted by Dr  Nancy Hodges, no longer functioning, device abandoned.  Nancy Hodges VIDEO ASSISTED THORACOSCOPY (VATS)/WEDGE RESECTION Right 10/27/2012   Procedure: VIDEO ASSISTED THORACOSCOPY (VATS),RGHT UPPER LOBE LUNG WEDGE RESECTION;  Surgeon: Nancy Nakayama, MD;  Location: [facility];  Service: Thoracic;  Laterality: Right;    ROS- all systems are reviewed and negative except as per HPI above  Current Outpatient Medications    Medication Sig Dispense Refill  . acetaminophen (TYLENOL) 500 MG tablet Take 500 mg by mouth 3 (three) times daily as needed for moderate pain or fever.     Nancy Hodges albuterol (PROVENTIL HFA;VENTOLIN HFA) 108 (90 BASE) MCG/ACT inhaler Inhale 2 puffs into the lungs every 6 (six) hours as needed for wheezing or shortness of breath.     Nancy Hodges azelastine (ASTELIN) 0.1 % nasal spray Place 1 spray into both nostrils 2 (two) times daily. Use in each nostril as directed    . calcium carbonate (TUMS - DOSED IN MG ELEMENTAL CALCIUM) 500 MG chewable tablet Chew 1 tablet by mouth 2 (two) times daily as needed for indigestion or heartburn.     . diltiazem (CARTIA XT) 240 MG 24 hr capsule Take 1 capsule (240 mg total) by mouth daily. 90 capsule 2  . dimenhyDRINATE (DRAMAMINE) 50 MG tablet Take 12.5 mg by mouth every 8 (eight) hours as needed for dizziness.    Nancy Hodges ipratropium-albuterol (DUONEB) 0.5-2.5 (3) MG/3ML SOLN Take 3 mLs by nebulization every 6 (six) hours as needed (shortness of breath or wheezing).     Nancy Hodges lidocaine-prilocaine (EMLA) cream APPLY TOPICALLY TO TO THE AFFECTED AREA AS NEEDED 30 g 4  . loratadine (CLARITIN) 10 MG tablet Take 10 mg by mouth daily.    Nancy Hodges OVER THE COUNTER MEDICATION Take 5 mLs by mouth daily as needed (cough). [employer] kids cough syrup    . Polyvinyl Alcohol-Povidone (REFRESH OP) Place 1 drop into both eyes daily as needed (dry eyes).    . rivaroxaban (XARELTO) 20 MG TABS tablet TAKE 1 TABLET(20 MG) BY MOUTH DAILY WITH SUPPER 30 tablet 10   No current facility-administered medications for this visit.    Physical Exam: Vitals:   03/09/20 1007  BP: 136/70  Pulse: 61  Weight: 148 lb (67.1 kg)  Height: 5\' 2"  (1.575 m)    GEN- The patient is well appearing, alert and oriented x 3 today.   Head- normocephalic, atraumatic Eyes-  Sclera clear, conjunctiva pink Ears- hearing intact Oropharynx- clear Lungs- normal work of breathing Chest- pacemaker pocket is well healed Heart- Regular  rate and rhythm (paced) GI- soft  Extremities- no clubbing, cyanosis, or edema  Pacemaker interrogation- reviewed in detail today,  See PACEART report  ekg tracing ordered today is personally reviewed and shows afib, demand V pacing  Assessment and Plan:  1. Symptomatic sinus bradycardia  Normal pacemaker function See Pace Art report Reprogrammed VVI today due to permanent afib she is not device dependant today  2. Persistent afib Tolerating afib well, with minimal symptoms afib burden is 100% (previously 69%) She is on xarelto for chads2vasc score of 5, including prior stroke. Will plan rate control going forward  Risks, benefits and potential toxicities for medications prescribed and/or refilled reviewed with patient today.   Return to see me in a year  [name] MD, [facility] 03/09/2020 10:32 AM

## 2020-03-13 ENCOUNTER — Encounter: Payer: Self-pay | Admitting: Internal Medicine

## 2020-03-13 ENCOUNTER — Inpatient Hospital Stay: Payer: Medicare Other

## 2020-03-13 ENCOUNTER — Ambulatory Visit: Payer: Medicare Other | Admitting: Physician Assistant

## 2020-03-13 ENCOUNTER — Inpatient Hospital Stay: Payer: Medicare Other | Attending: Internal Medicine

## 2020-03-13 ENCOUNTER — Other Ambulatory Visit: Payer: Self-pay

## 2020-03-13 ENCOUNTER — Other Ambulatory Visit: Payer: Medicare Other

## 2020-03-13 ENCOUNTER — Ambulatory Visit: Payer: Medicare Other

## 2020-03-13 ENCOUNTER — Inpatient Hospital Stay (HOSPITAL_BASED_OUTPATIENT_CLINIC_OR_DEPARTMENT_OTHER): Payer: Medicare Other | Admitting: Internal Medicine

## 2020-03-13 VITALS — BP 159/63 | HR 70 | Temp 97.5°F | Resp 20 | Ht 62.0 in | Wt 147.8 lb

## 2020-03-13 DIAGNOSIS — Z5112 Encounter for antineoplastic immunotherapy: Secondary | ICD-10-CM | POA: Insufficient documentation

## 2020-03-13 DIAGNOSIS — C3411 Malignant neoplasm of upper lobe, right bronchus or lung: Secondary | ICD-10-CM | POA: Diagnosis present

## 2020-03-13 DIAGNOSIS — C3491 Malignant neoplasm of unspecified part of right bronchus or lung: Secondary | ICD-10-CM

## 2020-03-13 DIAGNOSIS — C773 Secondary and unspecified malignant neoplasm of axilla and upper limb lymph nodes: Secondary | ICD-10-CM | POA: Diagnosis present

## 2020-03-13 DIAGNOSIS — Z79899 Other long term (current) drug therapy: Secondary | ICD-10-CM | POA: Insufficient documentation

## 2020-03-13 DIAGNOSIS — E032 Hypothyroidism due to medicaments and other exogenous substances: Secondary | ICD-10-CM

## 2020-03-13 LAB — CMP (CANCER CENTER ONLY)
ALT: 25 U/L (ref 0–44)
AST: 32 U/L (ref 15–41)
Albumin: 4 g/dL (ref 3.5–5.0)
Alkaline Phosphatase: 112 U/L (ref 38–126)
Anion gap: 8 (ref 5–15)
BUN: 9 mg/dL (ref 8–23)
CO2: 28 mmol/L (ref 22–32)
Calcium: 10 mg/dL (ref 8.9–10.3)
Chloride: 106 mmol/L (ref 98–111)
Creatinine: 0.78 mg/dL (ref 0.44–1.00)
GFR, Est AFR Am: >60 mL/min (ref >60)
GFR, Estimated: >60 mL/min (ref >60)
Glucose, Bld: 107 mg/dL — ABNORMAL HIGH (ref 70–99)
Potassium: 4.5 mmol/L (ref 3.5–5.1)
Sodium: 142 mmol/L (ref 135–145)
Total Bilirubin: 1.5 mg/dL — ABNORMAL HIGH (ref 0.3–1.2)
Total Protein: 7.4 g/dL (ref 6.5–8.1)

## 2020-03-13 LAB — CBC WITH DIFFERENTIAL (CANCER CENTER ONLY)
Abs Immature Granulocytes: 0.01 10*3/uL (ref 0.00–0.07)
Basophils Absolute: 0.1 10*3/uL (ref 0.0–0.1)
Basophils Relative: 1 %
Eosinophils Absolute: 0.2 10*3/uL (ref 0.0–0.5)
Eosinophils Relative: 3 %
HCT: 40.4 % (ref 36.0–46.0)
Hemoglobin: 13.9 g/dL (ref 12.0–15.0)
Immature Granulocytes: 0 %
Lymphocytes Relative: 25 %
Lymphs Abs: 1.2 10*3/uL (ref 0.7–4.0)
MCH: 33.1 pg (ref 26.0–34.0)
MCHC: 34.4 g/dL (ref 30.0–36.0)
MCV: 96.2 fL (ref 80.0–100.0)
Monocytes Absolute: 0.5 10*3/uL (ref 0.1–1.0)
Monocytes Relative: 9 %
Neutro Abs: 3.1 10*3/uL (ref 1.7–7.7)
Neutrophils Relative %: 62 %
Platelet Count: 182 10*3/uL (ref 150–400)
RBC: 4.2 MIL/uL (ref 3.87–5.11)
RDW: 12.8 % (ref 11.5–15.5)
WBC Count: 5 10*3/uL (ref 4.0–10.5)
nRBC: 0 % (ref 0.0–0.2)

## 2020-03-13 LAB — TSH: TSH: 4.424 u[IU]/mL — ABNORMAL HIGH (ref 0.308–3.960)

## 2020-03-13 MED ORDER — SODIUM CHLORIDE 0.9 % IV SOLN
480.0000 mg | Freq: Once | INTRAVENOUS | Status: AC
  Administered 2020-03-13: 480 mg via INTRAVENOUS
  Filled 2020-03-13: qty 48

## 2020-03-13 MED ORDER — HEPARIN SOD (PORK) LOCK FLUSH 100 UNIT/ML IV SOLN
500.0000 [IU] | Freq: Once | INTRAVENOUS | Status: AC | PRN
  Administered 2020-03-13: 500 [IU]
  Filled 2020-03-13: qty 5

## 2020-03-13 MED ORDER — SODIUM CHLORIDE 0.9% FLUSH
10.0000 mL | INTRAVENOUS | Status: DC | PRN
  Administered 2020-03-13: 10 mL
  Filled 2020-03-13: qty 10

## 2020-03-13 MED ORDER — SODIUM CHLORIDE 0.9 % IV SOLN
Freq: Once | INTRAVENOUS | Status: AC
  Filled 2020-03-13: qty 250

## 2020-03-13 NOTE — Patient Instructions (Signed)
[facility] Discharge Instructions for Patients Receiving Chemotherapy  Today you received the following chemotherapy agents Nivolumab (OPDIVO).  To help prevent nausea and vomiting after your treatment, we encourage you to take your nausea medication as prescribed.   If you develop nausea and vomiting that is not controlled by your nausea medication, call the clinic.   BELOW ARE SYMPTOMS THAT SHOULD BE REPORTED IMMEDIATELY:  *FEVER GREATER THAN 100.5 F  *CHILLS WITH OR WITHOUT FEVER  NAUSEA AND VOMITING THAT IS NOT CONTROLLED WITH YOUR NAUSEA MEDICATION  *UNUSUAL SHORTNESS OF BREATH  *UNUSUAL BRUISING OR BLEEDING  TENDERNESS IN MOUTH AND THROAT WITH OR WITHOUT PRESENCE OF ULCERS  *URINARY PROBLEMS  *BOWEL PROBLEMS  UNUSUAL RASH Items with * indicate a potential emergency and should be followed up as soon as possible.  Feel free to call the clinic should you have any questions or concerns. The clinic phone number is [phone].  Please show the CHEMO ALERT CARD at check-in to the Emergency Department and triage nurse.   

## 2020-03-13 NOTE — Progress Notes (Signed)
[facility] Telephone:([phone]) [phone]   Fax:([phone]) [phone]  OFFICE PROGRESS NOTE  Harris, [name], [name] [address] [place] [address]  DIAGNOSIS: Metastatic non-small cell lung cancer, adenocarcinoma. This was initially diagnosed as stage IB (T2a, N0, M0) in March of 2014, status post right upper lobectomy with lymph node dissection but the patient has evidence for disease recurrence in the right axilla status post resection.  PRIOR THERAPY:  1) Status post right axillary lymph node biopsy on 05/01/2014 under the care of Dr. Roxan Hockey. 2) Systemic chemotherapy with carboplatin for AUC of 5 and Alimta 500 mg/M2 every 3 weeks. First cycle 05/31/2014. Status post 6 cycles. Last dose was given 09/22/2014. 3) palliative radiotherapy to the subcarinal lymph node under the care of Dr. Sondra Come. 4) Nivolumab 240 mg IV every 2 weeks. First dose 12/23/2016. Status post 6 cycles.  CURRENT THERAPY:  Nivolumab 480 mg IV every 4 weeks. First dose 03/17/2017. Status post 39 cycles.  INTERVAL HISTORY: Nancy Hodges 74 y.o. female returns to the clinic today for follow-up visit.  The patient is feeling fine today with no concerning complaints.  She was diagnosed with TIA recently.  She denied having any current chest pain, shortness of breath, cough or hemoptysis.  She denied having any fever or chills.  She has no nausea, vomiting, diarrhea or constipation.  She has no headache or visual changes.  The patient is here today for evaluation before starting cycle #40 of her treatment.  MEDICAL HISTORY: Past Medical History:  Diagnosis Date  . Allergic rhinitis   . Anxiety   . Arthritis   . Asthma   . Atrial flutter ([place])   . Cholelithiases 09/26/2015  . Diverticulosis   . Encounter for antineoplastic immunotherapy 12/09/2016  . GERD (gastroesophageal reflux disease)   . H/O hiatal hernia   . History of blood transfusion   . History of chemotherapy   . History of kidney  stones   . History of radiation therapy   . Internal hemorrhoid   . Lung cancer ([place])    a. Stage IB non-small cell carcinoma, s/p R VATS, wedge resection, RU lobectomy 10/2012.  [name] Persistent atrial fibrillation ([facility])    a. anticoagulated with Xarelto  . Pneumonia 06/2016   [facility]  . Presence of permanent cardiac pacemaker   . Pulmonary nodule   . Tachycardia-bradycardia syndrome ([place])    a. 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    ALLERGIES:  is allergic to chlorpheniramine-dm, prednisone, septra [sulfamethoxazole-trimethoprim], vioxx [rofecoxib], molds & smuts, and penicillins.  MEDICATIONS:  Current Outpatient Medications  Medication Sig Dispense Refill  . acetaminophen (TYLENOL) 500 MG tablet Take 500 mg by mouth 3 (three) times daily as needed for moderate pain or fever.     [name] albuterol (PROVENTIL HFA;VENTOLIN HFA) 108 (90 BASE) MCG/ACT inhaler Inhale 2 puffs into the lungs every 6 (six) hours as needed for wheezing or shortness of breath.     [name] azelastine (ASTELIN) 0.1 % nasal spray Place 1 spray into both nostrils 2 (two) times daily. Use in each nostril as directed    . calcium carbonate (TUMS - DOSED IN MG ELEMENTAL CALCIUM) 500 MG chewable tablet Chew 1 tablet by mouth 2 (two) times daily as needed for indigestion or heartburn.     . diltiazem (CARTIA XT) 240 MG 24 hr capsule Take 1 capsule (240 mg total) by mouth daily. 90 capsule 2  . dimenhyDRINATE (DRAMAMINE) 50 MG tablet  Take 12.5 mg by mouth every 8 (eight) hours as needed for dizziness.    [name] ipratropium-albuterol (DUONEB) 0.5-2.5 (3) MG/3ML SOLN Take 3 mLs by nebulization every 6 (six) hours as needed (shortness of breath or wheezing).     [name] lidocaine-prilocaine (EMLA) cream APPLY TOPICALLY TO TO THE AFFECTED AREA AS NEEDED 30 g 4  . loratadine (CLARITIN) 10 MG tablet Take 10 mg by mouth daily.    [name] OVER THE COUNTER MEDICATION Take 5 mLs by mouth daily as needed (cough). [employer]'s kids cough syrup    .  Polyvinyl Alcohol-Povidone (REFRESH OP) Place 1 drop into both eyes daily as needed (dry eyes).    . rivaroxaban (XARELTO) 20 MG TABS tablet TAKE 1 TABLET(20 MG) BY MOUTH DAILY WITH SUPPER 30 tablet 10   No current facility-administered medications for this visit.    SURGICAL HISTORY:  Past Surgical History:  Procedure Laterality Date  . CARDIOVERSION N/A 02/19/2018   Procedure: CARDIOVERSION;  Surgeon: Sanda Klein, MD;  Location: [facility];  Service: Cardiovascular;  Laterality: N/A;  . CARDIOVERSION N/A 04/30/2018   Procedure: CARDIOVERSION;  Surgeon: Thayer Headings, MD;  Location: [facility] ENDOSCOPY;  Service: Cardiovascular;  Laterality: N/A;  . CATARACT EXTRACTION W/PHACO Right 08/14/2016   Procedure: CATARACT EXTRACTION PHACO AND INTRAOCULAR LENS PLACEMENT RIGHT EYE CDE 10.53;  Surgeon: Tonny Branch, MD;  Location: [facility];  Service: Ophthalmology;  Laterality: Right;  right  . CATARACT EXTRACTION W/PHACO Left 09/25/2016   Procedure: CATARACT EXTRACTION PHACO AND INTRAOCULAR LENS PLACEMENT (IOC);  Surgeon: Tonny Branch, MD;  Location: [facility];  Service: Ophthalmology;  Laterality: Left;  left CDE 8.81  . COLONOSCOPY     [facility]: cattered sigmoid diverticula, internal hemorrhoids, sessile polyp in ascending and mid transverse colon (tubular adenoma).   . colonoscopy with polypectomy  2015  . ESOPHAGOGASTRODUODENOSCOPY     [facility]: bile reflux in stomach, mild gastritis, hiatal hernia  . ESOPHAGOGASTRODUODENOSCOPY N/A 01/31/2016   Procedure: ESOPHAGOGASTRODUODENOSCOPY (EGD);  Surgeon: [name], MD;  [facility]: [facility];  Service: Endoscopy;  Laterality: N/A;  1200  . IR FLUORO GUIDE PORT INSERTION RIGHT  06/30/2017  . IR US GUIDE VASC ACCESS RIGHT  06/30/2017  . LOBECTOMY Right 10/27/2012   Procedure: LOBECTOMY;  Surgeon: [name], MD;  Location: [facility];  Service: Thoracic;  Laterality: Right;   RIGHT UPPER LOBECTOMY & Node Dissection  . LYMPH NODE BIOPSY Right  05/01/2014   Procedure: LYMPH NODE BIOPSY, Right Axillary;  Surgeon: [name], MD;  Location: [facility];  Service: Thoracic;  Laterality: Right;  . PACEMAKER IMPLANT N/A 10/30/2017   SJM Assurity MRI PPM implanted by Dr [name] once leadless pacemaker battery depleted  . PARTIAL HYSTERECTOMY    . PERMANENT PACEMAKER INSERTION N/A 09/14/2012   Nanostim (SJM) leadless pacemaker (LEADLESS II STUDY PATIENT) implanted by Dr [name], no longer functioning, device abandoned.  [name] VIDEO ASSISTED THORACOSCOPY (VATS)/WEDGE RESECTION Right 10/27/2012   Procedure: VIDEO ASSISTED THORACOSCOPY (VATS),RGHT UPPER LOBE LUNG WEDGE RESECTION;  Surgeon: [name], MD;  Location: [facility];  Service: Thoracic;  Laterality: Right;    REVIEW OF SYSTEMS:  A comprehensive review of systems was negative.   PHYSICAL EXAMINATION: General appearance: alert, cooperative and no distress Head: Normocephalic, without obvious abnormality, atraumatic Neck: no adenopathy, no JVD, supple, symmetrical, trachea midline and thyroid not enlarged, symmetric, no tenderness/mass/nodules Lymph nodes: Cervical, supraclavicular, and axillary nodes normal. Resp: clear to auscultation bilaterally Back: symmetric, no curvature. ROM normal. No CVA  tenderness. Cardio: regular rate and rhythm, S1, S2 normal, no murmur, click, rub or gallop GI: soft, non-tender; bowel sounds normal; no masses,  no organomegaly Extremities: extremities normal, atraumatic, no cyanosis or edema  ECOG PERFORMANCE STATUS: 1 - Symptomatic but completely ambulatory  Blood pressure (!) 159/63, pulse 70, temperature (!) 97.5 F (36.4 C), temperature source Tympanic, resp. rate 20, height 5\' 2"  (1.575 m), weight 147 lb 12.8 oz (67 kg).  LABORATORY DATA: Lab Results  Component Value Date   WBC 5.0 03/13/2020   HGB 13.9 03/13/2020   HCT 40.4 03/13/2020   MCV 96.2 03/13/2020   PLT 182 03/13/2020      Chemistry      Component Value Date/Time   NA 140  02/13/2020 0929   NA 139 08/05/2017 0902   K 4.6 02/13/2020 0929   K 4.1 08/05/2017 0902   CL 103 02/13/2020 0929   CO2 27 02/13/2020 0929   CO2 28 08/05/2017 0902   BUN 8 02/13/2020 0929   BUN 10.1 08/05/2017 0902   CREATININE 0.76 02/13/2020 0929   CREATININE 0.8 08/05/2017 0902   GLU 134 (H) 02/18/2017 1410      Component Value Date/Time   CALCIUM 9.8 02/13/2020 0929   CALCIUM 9.5 08/05/2017 0902   ALKPHOS 117 02/13/2020 0929   ALKPHOS 111 08/05/2017 0902   AST 39 02/13/2020 0929   AST 33 08/05/2017 0902   ALT 31 02/13/2020 0929   ALT 34 08/05/2017 0902   BILITOT 1.8 (H) 02/13/2020 0929   BILITOT 1.39 (H) 08/05/2017 0902       RADIOGRAPHIC STUDIES: CT Chest W Contrast  Result Date: 02/13/2020 CLINICAL DATA:  75 year old female with history of non-small cell lung cancer (adenocarcinoma originally diagnosed in March of 2014). Staging examination. EXAM: CT CHEST, ABDOMEN, AND PELVIS WITH CONTRAST TECHNIQUE: Multidetector CT imaging of the chest, abdomen and pelvis was performed following the standard protocol during bolus administration of intravenous contrast. CONTRAST:  137mL OMNIPAQUE IOHEXOL 300 MG/ML  SOLN COMPARISON:  CT of the chest, abdomen and pelvis 11/17/2019. FINDINGS: CT CHEST FINDINGS Cardiovascular: Heart size is normal. There is no significant pericardial fluid, thickening or pericardial calcification. There is aortic atherosclerosis, as well as atherosclerosis of the great vessels of the mediastinum and the coronary arteries, including calcified atherosclerotic plaque in the left anterior descending, left circumflex and right coronary arteries. Left-sided pacemaker device in place with lead tips terminating in the right atrium and right ventricular apex. Right internal jugular single-lumen porta cath with tip terminating in the right atrium. Mediastinum/Nodes: No pathologically enlarged mediastinal or hilar lymph nodes. Please note that accurate exclusion of hilar  adenopathy is limited on noncontrast CT scans. Esophagus is unremarkable in appearance. No axillary lymphadenopathy. Lungs/Pleura: Status post right upper lobectomy. Compensatory hyperexpansion of the right middle and lower lobes. Fibrotic changes are noted in the medial aspect of the right lower lobe, presumably related to prior radiation therapy. Mild pleural thickening in the lateral aspect of the right hemithorax, stable compared to prior examinations. No suspicious appearing pulmonary nodules or masses are noted. No acute consolidative airspace disease. No pleural effusions. Musculoskeletal: There are no aggressive appearing lytic or blastic lesions noted in the visualized portions of the skeleton. CT ABDOMEN PELVIS FINDINGS Hepatobiliary: Liver has a shrunken appearance and nodular contour, indicative of cirrhosis. No suspicious cystic or solid hepatic lesions. No intra or extrahepatic biliary ductal dilatation. Numerous tiny calcified gallstones are lying dependently in the gallbladder, without findings to suggest an acute cholecystitis  at this time. Pancreas: No pancreatic mass. No pancreatic ductal dilatation. No pancreatic or peripancreatic fluid collections or inflammatory changes. Spleen: Well-defined low-intermediate attenuation lesion in the inferior aspect of the spleen, incompletely characterized but similar to the prior study and statistically likely to represent a small mildly proteinaceous cyst or other benign lesion. Small splenule inferior to the spleen incidentally noted. Adrenals/Urinary Tract: Multiple areas of parenchymal thinning in both kidneys, presumably post infectious scarring. No suspicious renal lesions. No hydroureteronephrosis. Urinary bladder is normal in appearance. Stomach/Bowel: Normal appearance of the stomach. No pathologic dilatation of small bowel or colon. Numerous colonic diverticulae are noted, particularly in the sigmoid colon, without surrounding inflammatory changes  to suggest an acute diverticulitis at this time. Normal appendix. Vascular/Lymphatic: Aortic atherosclerosis, without evidence of aneurysm or dissection in the abdominal or pelvic vasculature. No lymphadenopathy noted in the abdomen or pelvis. Reproductive: Status post hysterectomy.  Ovaries are atrophic. Other: No significant volume of ascites.  No pneumoperitoneum. Musculoskeletal: There are no aggressive appearing lytic or blastic lesions noted in the visualized portions of the skeleton. IMPRESSION: 1. No findings to suggest recurrent or metastatic disease in the chest, abdomen or pelvis. 2. Morphologic changes in the liver indicative of underlying cirrhosis. 3. Cholelithiasis without evidence of acute cholecystitis at this time. 4. Colonic diverticulosis without evidence of acute diverticulitis at this time 5. Aortic atherosclerosis, in addition to 3 vessel coronary artery disease. Assessment for potential risk factor modification, dietary therapy or pharmacologic therapy may be warranted, if clinically indicated. 6. Additional incidental findings, as above. Electronically Signed   By: Vinnie Langton M.D.   On: 02/13/2020 11:43   CT Abdomen Pelvis W Contrast  Result Date: 02/13/2020 CLINICAL DATA:  74 year old female with history of non-small cell lung cancer (adenocarcinoma originally diagnosed in March of 2014). Staging examination. EXAM: CT CHEST, ABDOMEN, AND PELVIS WITH CONTRAST TECHNIQUE: Multidetector CT imaging of the chest, abdomen and pelvis was performed following the standard protocol during bolus administration of intravenous contrast. CONTRAST:  179mL OMNIPAQUE IOHEXOL 300 MG/ML  SOLN COMPARISON:  CT of the chest, abdomen and pelvis 11/17/2019. FINDINGS: CT CHEST FINDINGS Cardiovascular: Heart size is normal. There is no significant pericardial fluid, thickening or pericardial calcification. There is aortic atherosclerosis, as well as atherosclerosis of the great vessels of the mediastinum  and the coronary arteries, including calcified atherosclerotic plaque in the left anterior descending, left circumflex and right coronary arteries. Left-sided pacemaker device in place with lead tips terminating in the right atrium and right ventricular apex. Right internal jugular single-lumen porta cath with tip terminating in the right atrium. Mediastinum/Nodes: No pathologically enlarged mediastinal or hilar lymph nodes. Please note that accurate exclusion of hilar adenopathy is limited on noncontrast CT scans. Esophagus is unremarkable in appearance. No axillary lymphadenopathy. Lungs/Pleura: Status post right upper lobectomy. Compensatory hyperexpansion of the right middle and lower lobes. Fibrotic changes are noted in the medial aspect of the right lower lobe, presumably related to prior radiation therapy. Mild pleural thickening in the lateral aspect of the right hemithorax, stable compared to prior examinations. No suspicious appearing pulmonary nodules or masses are noted. No acute consolidative airspace disease. No pleural effusions. Musculoskeletal: There are no aggressive appearing lytic or blastic lesions noted in the visualized portions of the skeleton. CT ABDOMEN PELVIS FINDINGS Hepatobiliary: Liver has a shrunken appearance and nodular contour, indicative of cirrhosis. No suspicious cystic or solid hepatic lesions. No intra or extrahepatic biliary ductal dilatation. Numerous tiny calcified gallstones are lying dependently in the  gallbladder, without findings to suggest an acute cholecystitis at this time. Pancreas: No pancreatic mass. No pancreatic ductal dilatation. No pancreatic or peripancreatic fluid collections or inflammatory changes. Spleen: Well-defined low-intermediate attenuation lesion in the inferior aspect of the spleen, incompletely characterized but similar to the prior study and statistically likely to represent a small mildly proteinaceous cyst or other benign lesion. Small splenule  inferior to the spleen incidentally noted. Adrenals/Urinary Tract: Multiple areas of parenchymal thinning in both kidneys, presumably post infectious scarring. No suspicious renal lesions. No hydroureteronephrosis. Urinary bladder is normal in appearance. Stomach/Bowel: Normal appearance of the stomach. No pathologic dilatation of small bowel or colon. Numerous colonic diverticulae are noted, particularly in the sigmoid colon, without surrounding inflammatory changes to suggest an acute diverticulitis at this time. Normal appendix. Vascular/Lymphatic: Aortic atherosclerosis, without evidence of aneurysm or dissection in the abdominal or pelvic vasculature. No lymphadenopathy noted in the abdomen or pelvis. Reproductive: Status post hysterectomy.  Ovaries are atrophic. Other: No significant volume of ascites.  No pneumoperitoneum. Musculoskeletal: There are no aggressive appearing lytic or blastic lesions noted in the visualized portions of the skeleton. IMPRESSION: 1. No findings to suggest recurrent or metastatic disease in the chest, abdomen or pelvis. 2. Morphologic changes in the liver indicative of underlying cirrhosis. 3. Cholelithiasis without evidence of acute cholecystitis at this time. 4. Colonic diverticulosis without evidence of acute diverticulitis at this time 5. Aortic atherosclerosis, in addition to 3 vessel coronary artery disease. Assessment for potential risk factor modification, dietary therapy or pharmacologic therapy may be warranted, if clinically indicated. 6. Additional incidental findings, as above. Electronically Signed   By: Vinnie Langton M.D.   On: 02/13/2020 11:43   CUP PACEART INCLINIC DEVICE CHECK  Result Date: 03/09/2020 Pacemaker check in clinic. Normal device function. Thresholds, sensing, impedances consistent with previous measurements. Device programmed to maximize longevity. Persistent AF, on Xarelto, reprogrammed to VVI per JA. 26 high ventricular rates noted, available  EGMs show AF w/RVR. V rates overall well controlled. Device programmed at appropriate safety margins. Histogram distribution appropriate for patient activity level. Device programmed to optimize intrinsic conduction. Estimated longevity 10.6-11.2 years. Patient enrolled in remote follow-up. Patient education completed. [place] on 05/01/20 and ROV with JA/E in 1 year.Levander Campion BSN, RN, CCDS  ASSESSMENT AND PLAN:  This is a very pleasant 74 years old white female with metastatic non-small cell lung cancer, adenocarcinoma with no actionable mutations status post systemic chemotherapy with carboplatin and Alimta and she is currently undergoing treatment with second line immunotherapy with Nivolumab status post 6 cycles. This was followed by immunotherapy with Nivolumab 480 mg IV every 4 weeks status post 39 cycles. The patient continues to tolerate this treatment well with no concerning adverse effects. I recommended for her to continue her current treatment and she will proceed with cycle #4 today. For the cardiac arrhythmia and hypertension she is followed by cardiology. I will see her back for follow-up visit in 4 weeks for evaluation before the next cycle of her treatment. The patient was advised to call immediately if she has any concerning symptoms in the interval. The patient voices understanding of current disease status and treatment options and is in agreement with the current care plan. All questions were answered. The patient knows to call the clinic with any problems, questions or concerns. We can certainly see the patient much sooner if necessary.  Disclaimer: This note was dictated with voice recognition software. Similar sounding words can inadvertently be transcribed and may not be  corrected upon review.

## 2020-03-14 ENCOUNTER — Telehealth: Payer: Self-pay | Admitting: Internal Medicine

## 2020-03-14 NOTE — Telephone Encounter (Signed)
Next two chemo's already scheduled for 8/10 los - no additional appts added.

## 2020-03-21 ENCOUNTER — Other Ambulatory Visit: Payer: Self-pay | Admitting: Internal Medicine

## 2020-03-21 DIAGNOSIS — Z1231 Encounter for screening mammogram for malignant neoplasm of breast: Secondary | ICD-10-CM

## 2020-04-03 NOTE — Progress Notes (Signed)
[place] [facility] OFFICE PROGRESS NOTE  [name], [name] [address] [place] [address]  DIAGNOSIS: Metastatic non-small cell lung cancer, adenocarcinoma. This was initially diagnosed as stage IB (T2a, N0, M0) in March of 2014, status post right upper lobectomy with lymph node dissection but the patient has evidence for disease recurrence in the right axilla status post resection.  PRIOR THERAPY: 1) Status post right axillary lymph node biopsy on 05/01/2014 under the care of Dr. Roxan Hockey. 2) Systemic chemotherapy with carboplatin for AUC of 5 and Alimta 500 mg/M2 every 3 weeks. First cycle 05/31/2014. Status post 6 cycles. Last dose was given 09/22/2014. 3) palliative radiotherapy to the subcarinal lymph node under the care of Dr. Sondra Come. 4) Nivolumab 240 mg IV every 2 weeks. First dose 12/23/2016. Status post 6 cycles.  CURRENT THERAPY: Nivolumab 480 mg IV every 4 weeks. First dose 03/17/2017. Status post 40cycles.  INTERVAL HISTORY: Nancy Hodges 74 y.o. female returns to the clinic for a follow up visit. The patient is feeling fairly well today without any concerning complaints except she has some sore of skin lesion on her right hand which appeared after working in the yard. She was treated with doxycycline for possible exposure to lyme's disease and finished her course of doxycycline today. The patient continues to tolerate treatment with nivolumab well without any adverse effects. Denies any fever, chills, night sweats, or weight loss. Denies any chest pain, shortness of breath, cough, or hemoptysis. Denies any nausea, vomiting, diarrhea, or constipation. Denies any headache or visual changes. The patient is here today for evaluation prior to starting cycle # 41  MEDICAL HISTORY: Past Medical History:  Diagnosis Date  . Allergic rhinitis   . Anxiety   . Arthritis   . Asthma   . Atrial flutter ([place])   . Cholelithiases 09/26/2015  . Diverticulosis   .  Encounter for antineoplastic immunotherapy 12/09/2016  . GERD (gastroesophageal reflux disease)   . H/O hiatal hernia   . History of blood transfusion   . History of chemotherapy   . History of kidney stones   . History of radiation therapy   . Internal hemorrhoid   . Lung cancer ([place])    a. Stage IB non-small cell carcinoma, s/p R VATS, wedge resection, RU lobectomy 10/2012.  [name] Persistent atrial fibrillation ([facility])    a. anticoagulated with Xarelto  . Pneumonia 06/2016   [facility]  . Presence of permanent cardiac pacemaker   . Pulmonary nodule   . 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    ALLERGIES:  is allergic to chlorpheniramine-dm, prednisone, septra [sulfamethoxazole-trimethoprim], vioxx [rofecoxib], molds & smuts, and penicillins.  MEDICATIONS:  Current Outpatient Medications  Medication Sig Dispense Refill  . acetaminophen (TYLENOL) 500 MG tablet Take 500 mg by mouth 3 (three) times daily as needed for moderate pain or fever.     [name] albuterol (PROVENTIL HFA;VENTOLIN HFA) 108 (90 BASE) MCG/ACT inhaler Inhale 2 puffs into the lungs every 6 (six) hours as needed for wheezing or shortness of breath.     [name] azelastine (ASTELIN) 0.1 % nasal spray Place 1 spray into both nostrils 2 (two) times daily. Use in each nostril as directed    . calcium carbonate (TUMS - DOSED IN MG ELEMENTAL CALCIUM) 500 MG chewable tablet Chew 1 tablet by mouth 2 (two) times daily as needed for indigestion or heartburn.     . diltiazem (CARTIA XT) 240 MG 24  hr capsule Take 1 capsule (240 mg total) by mouth daily. 90 capsule 2  . dimenhyDRINATE (DRAMAMINE) 50 MG tablet Take 12.5 mg by mouth every 8 (eight) hours as needed for dizziness.    [name] ipratropium-albuterol (DUONEB) 0.5-2.5 (3) MG/3ML SOLN Take 3 mLs by nebulization every 6 (six) hours as needed (shortness of breath or wheezing).     [name] lidocaine-prilocaine (EMLA) cream APPLY TOPICALLY TO TO THE AFFECTED  AREA AS NEEDED 30 g 4  . loratadine (CLARITIN) 10 MG tablet Take 10 mg by mouth daily.    [name] OVER THE COUNTER MEDICATION Take 5 mLs by mouth daily as needed (cough). [employer] kids cough syrup    . Polyvinyl Alcohol-Povidone (REFRESH OP) Place 1 drop into both eyes daily as needed (dry eyes).    . rivaroxaban (XARELTO) 20 MG TABS tablet TAKE 1 TABLET(20 MG) BY MOUTH DAILY WITH SUPPER 30 tablet 10   No current facility-administered medications for this visit.   Facility-Administered Medications Ordered in Other Visits  Medication Dose Route Frequency Provider Last Rate Last Admin  . 0.9 %  sodium chloride infusion   Intravenous Once Curt Bears, MD      . heparin lock flush 100 unit/mL  500 Units Intracatheter Once PRN Curt Bears, MD      . nivolumab (OPDIVO) 480 mg in sodium chloride 0.9 % 100 mL chemo infusion  480 mg Intravenous Once Curt Bears, MD      . sodium chloride flush (NS) 0.9 % injection 10 mL  10 mL Intracatheter PRN Curt Bears, MD        SURGICAL HISTORY:  Past Surgical History:  Procedure Laterality Date  . CARDIOVERSION N/A 02/19/2018   Procedure: CARDIOVERSION;  Surgeon: Sanda Klein, MD;  Location: [facility] ENDOSCOPY;  Service: Cardiovascular;  Laterality: N/A;  . CARDIOVERSION N/A 04/30/2018   Procedure: CARDIOVERSION;  Surgeon: Thayer Headings, MD;  Location: [facility] ENDOSCOPY;  Service: Cardiovascular;  Laterality: N/A;  . CATARACT EXTRACTION W/PHACO Right 08/14/2016   Procedure: CATARACT EXTRACTION PHACO AND INTRAOCULAR LENS PLACEMENT RIGHT EYE CDE 10.53;  Surgeon: Tonny Branch, MD;  Location: [facility] OR[facility];  Service: Ophthalmology;  Laterality: Right;  right  . CATARACT EXTRACTION W/PHACO Left 09/25/2016   Procedure: CATARACT EXTRACTION PHACO AND INTRAOCULAR LENS PLACEMENT (IOC);  Surgeon: Tonny Branch, MD;  Location: [facility] OR[facility];  Service: Ophthalmology;  Laterality: Left;  left CDE 8.81  . COLONOSCOPY     [facility]: cattered sigmoid diverticula, internal hemorrhoids,  sessile polyp in ascending and mid transverse colon (tubular adenoma).   . colonoscopy with polypectomy  2015  . ESOPHAGOGASTRODUODENOSCOPY     [facility]: bile reflux in stomach, mild gastritis, hiatal hernia  . ESOPHAGOGASTRODUODENOSCOPY N/A 01/31/2016   Procedure: ESOPHAGOGASTRODUODENOSCOPY (EGD);  Surgeon: [name], MD;  Location: [facility];  Service: Endoscopy;  Laterality: N/A;  1200  . IR FLUORO GUIDE PORT INSERTION RIGHT  06/30/2017  . IR US GUIDE VASC ACCESS RIGHT  06/30/2017  . LOBECTOMY Right 10/27/2012   Procedure: LOBECTOMY;  Surgeon: [name], MD;  Location: [facility];  Service: Thoracic;  Laterality: Right;   RIGHT UPPER LOBECTOMY & Node Dissection  . LYMPH NODE BIOPSY Right 05/01/2014   Procedure: LYMPH NODE BIOPSY, Right Axillary;  Surgeon: [name], MD;  Location: [facility];  Service: Thoracic;  Laterality: Right;  . PACEMAKER IMPLANT N/A 10/30/2017   SJM Assurity MRI PPM implanted by Dr [name] once leadless pacemaker battery depleted  . PARTIAL HYSTERECTOMY    . PERMANENT  PACEMAKER INSERTION N/A 09/14/2012   Nanostim (SJM) leadless pacemaker (LEADLESS II STUDY PATIENT) implanted by Dr [name], no longer functioning, device abandoned.  [name] VIDEO ASSISTED THORACOSCOPY (VATS)/WEDGE RESECTION Right 10/27/2012   Procedure: VIDEO ASSISTED THORACOSCOPY (VATS),RGHT UPPER LOBE LUNG WEDGE RESECTION;  Surgeon: [name], MD;  Location: [facility];  Service: Thoracic;  Laterality: Right;    REVIEW OF SYSTEMS:   Review of Systems  Constitutional: Negative for appetite change, chills, fatigue, fever and unexpected weight change.  HENT: Negative for mouth sores, nosebleeds, sore throat and trouble swallowing.   Eyes: Negative for eye problems and icterus.  Respiratory: Negative for cough, hemoptysis, shortness of breath and wheezing.   Cardiovascular: Negative for chest pain and leg swelling.  Gastrointestinal: Negative for abdominal pain, constipation, diarrhea,  nausea and vomiting.  Genitourinary: Negative for bladder incontinence, difficulty urinating, dysuria, frequency and hematuria.   Musculoskeletal: Negative for back pain, gait problem, neck pain and neck stiffness.  Skin: Positive for skin lesion on right hand.  Neurological: Negative for dizziness, extremity weakness, gait problem, headaches, light-headedness and seizures.  Hematological: Negative for adenopathy. Does not bruise/bleed easily.  Psychiatric/Behavioral: Negative for confusion, depression and sleep disturbance. The patient is not nervous/anxious.     PHYSICAL EXAMINATION:  Blood pressure (!) 141/61, pulse 71, temperature 98.2 F (36.8 C), temperature source Tympanic, resp. rate 20, height 5\' 2"  (1.575 m), weight 146 lb 12.8 oz (66.6 kg), SpO2 100 %.  ECOG PERFORMANCE STATUS: 1 - Symptomatic but completely ambulatory  Physical Exam  Constitutional: Oriented to person, place, and time and well-developed, well-nourished, and in no distress.  HENT:  Head: Normocephalic and atraumatic.  Mouth/Throat: Oropharynx is clear and moist. No oropharyngeal exudate.  Eyes: Conjunctivae are normal. Right eye exhibits no discharge. Left eye exhibits no discharge. No scleral icterus.  Neck: Normal range of motion. Neck supple.  Cardiovascular: Normal rate, regular rhythm, normal heart sounds and intact distal pulses.   Pulmonary/Chest: Effort normal and breath sounds normal. No respiratory distress. No wheezes. No rales.  Abdominal: Soft. Bowel sounds are normal. Exhibits no distension and no mass. There is no tenderness.  Musculoskeletal: Normal range of motion. Exhibits no edema.  Lymphadenopathy:    No cervical adenopathy.  Neurological: Alert and oriented to person, place, and time. Exhibits normal muscle tone. Gait normal. Coordination normal.  Skin: Skin is warm and dry. No rash noted. Not diaphoretic. No erythema. No pallor.  Psychiatric: Mood, memory and judgment normal.  Vitals  reviewed.  LABORATORY DATA: Lab Results  Component Value Date   WBC 6.0 04/10/2020   HGB 13.4 04/10/2020   HCT 38.7 04/10/2020   MCV 93.9 04/10/2020   PLT 184 04/10/2020      Chemistry      Component Value Date/Time   NA 139 04/10/2020 0945   NA 139 08/05/2017 0902   K 4.2 04/10/2020 0945   K 4.1 08/05/2017 0902   CL 105 04/10/2020 0945   CO2 26 04/10/2020 0945   CO2 28 08/05/2017 0902   BUN 10 04/10/2020 0945   BUN 10.1 08/05/2017 0902   CREATININE 0.71 04/10/2020 0945   CREATININE 0.8 08/05/2017 0902   GLU 134 (H) 02/18/2017 1410      Component Value Date/Time   CALCIUM 9.5 04/10/2020 0945   CALCIUM 9.5 08/05/2017 0902   ALKPHOS 93 04/10/2020 0945   ALKPHOS 111 08/05/2017 0902   AST 30 04/10/2020 0945   AST 33 08/05/2017 0902   ALT 27 04/10/2020 0945  ALT 34 08/05/2017 0902   BILITOT 1.4 (H) 04/10/2020 0945   BILITOT 1.39 (H) 08/05/2017 0902       RADIOGRAPHIC STUDIES:  No results found.   ASSESSMENT/PLAN:  This is a very pleasant 74 year old Caucasian female with metastatic non-small cell lung cancer, adenocarcinoma. She was first diagnosed as a stage IB (T2a, N0, M0) in March 2014. She presented with a right upper lobe lesion status post right upper lobectomy and lymph node dissection. She had evidence if disease recurrence in the right axillar status post resection. She has no actionable mutations.   She is status post systemic chemotherapy with carboplatin and alimta. Status post 6 cycles.   She is currently undergoing immunotherapy with nivolumab. She is status post 6 cycles with 240 mg IV every 2 weeks.  She is currently undergoing immunotherapy with nivolumab 480 mg IV every 4 weeks.  She is status post 40 cycles.  She has been tolerating it well without any adverse side effects.   The patient was seen with Dr. Julien Nordmann today. Labs were reviewed.  We recommend that she proceed with cycle #41 today scheduled.  I will arrange for restaging CT  scan of the chest, abdomen, and pelvis prior to her next cycle of immunotherapy in 4 weeks. She will return around 9/30-10/4 for blood work and for her scan then return on 05/08/20 for her next cycle of treatment and to review her scan.   We will see her back for follow-up visit in 4 weeks for evaluation and to review her scan results before starting cycle #42  The patient was advised to call immediately if she has any concerning symptoms in the interval. The patient voices understanding of current disease status and treatment options and is in agreement with the current care plan. All questions were answered. The patient knows to call the clinic with any problems, questions or concerns. We can certainly see the patient much sooner if necessary     Orders Placed This Encounter  Procedures  . CT Chest W Contrast    Standing Status:   Future    Standing Expiration Date:   04/10/2021    Order Specific Question:   If indicated for the ordered procedure, I authorize the administration of contrast media per Radiology protocol    Answer:   Yes    Order Specific Question:   Preferred imaging location?    Answer:   [facility]    Order Specific Question:   Radiology Contrast Protocol - do NOT remove file path    Answer:   [email or web address]  . CT Abdomen Pelvis W Contrast    Standing Status:   Future    Standing Expiration Date:   04/10/2021    Order Specific Question:   If indicated for the ordered procedure, I authorize the administration of contrast media per Radiology protocol    Answer:   Yes    Order Specific Question:   Preferred imaging location?    Answer:   [facility]    Order Specific Question:   Is Oral Contrast requested for this exam?    Answer:   Yes, Per Radiology protocol    Order Specific Question:   Radiology Contrast Protocol - do NOT remove file path    Answer:   [email or web address]      Cato Liburd L Shanee Batch, PA-C 04/10/20  ADDENDUM: Hematology/Oncology Attending: I had a face-to-face encounter with the patient today.  I recommended her care plan.  This is  a very pleasant 74 years old white female with metastatic non-small cell lung cancer, adenocarcinoma and she is currently undergoing treatment with immunotherapy with nivolumab every 4 weeks status post 40 cycles.  The patient has been tolerating this treatment well with no concerning adverse effects. I recommended for the patient to proceed with cycle #41 today as planned. She will come back for follow-up visit in 4 weeks for evaluation with repeat CT scan of the chest, abdomen pelvis for restaging of her disease. She was advised to call immediately if she has any concerning symptoms in the interval.  Disclaimer: This note was dictated with voice recognition software. Similar sounding words can inadvertently be transcribed and may be missed upon review. Eilleen Kempf, MD 04/10/20

## 2020-04-10 ENCOUNTER — Inpatient Hospital Stay: Payer: Medicare Other | Attending: Internal Medicine

## 2020-04-10 ENCOUNTER — Inpatient Hospital Stay: Payer: Medicare Other

## 2020-04-10 ENCOUNTER — Inpatient Hospital Stay (HOSPITAL_BASED_OUTPATIENT_CLINIC_OR_DEPARTMENT_OTHER): Payer: Medicare Other | Admitting: Physician Assistant

## 2020-04-10 ENCOUNTER — Encounter: Payer: Self-pay | Admitting: Physician Assistant

## 2020-04-10 ENCOUNTER — Other Ambulatory Visit: Payer: Self-pay

## 2020-04-10 VITALS — BP 141/61 | HR 71 | Temp 98.2°F | Resp 20 | Ht 62.0 in | Wt 146.8 lb

## 2020-04-10 DIAGNOSIS — Z79899 Other long term (current) drug therapy: Secondary | ICD-10-CM | POA: Diagnosis not present

## 2020-04-10 DIAGNOSIS — E032 Hypothyroidism due to medicaments and other exogenous substances: Secondary | ICD-10-CM

## 2020-04-10 DIAGNOSIS — Z5112 Encounter for antineoplastic immunotherapy: Secondary | ICD-10-CM | POA: Diagnosis present

## 2020-04-10 DIAGNOSIS — C3491 Malignant neoplasm of unspecified part of right bronchus or lung: Secondary | ICD-10-CM

## 2020-04-10 DIAGNOSIS — C773 Secondary and unspecified malignant neoplasm of axilla and upper limb lymph nodes: Secondary | ICD-10-CM | POA: Insufficient documentation

## 2020-04-10 DIAGNOSIS — Z95828 Presence of other vascular implants and grafts: Secondary | ICD-10-CM

## 2020-04-10 DIAGNOSIS — C3411 Malignant neoplasm of upper lobe, right bronchus or lung: Secondary | ICD-10-CM | POA: Insufficient documentation

## 2020-04-10 LAB — CBC WITH DIFFERENTIAL (CANCER CENTER ONLY)
Abs Immature Granulocytes: 0.01 10*3/uL (ref 0.00–0.07)
Basophils Absolute: 0.1 10*3/uL (ref 0.0–0.1)
Basophils Relative: 1 %
Eosinophils Absolute: 0.2 10*3/uL (ref 0.0–0.5)
Eosinophils Relative: 3 %
HCT: 38.7 % (ref 36.0–46.0)
Hemoglobin: 13.4 g/dL (ref 12.0–15.0)
Immature Granulocytes: 0 %
Lymphocytes Relative: 21 %
Lymphs Abs: 1.2 10*3/uL (ref 0.7–4.0)
MCH: 32.5 pg (ref 26.0–34.0)
MCHC: 34.6 g/dL (ref 30.0–36.0)
MCV: 93.9 fL (ref 80.0–100.0)
Monocytes Absolute: 0.5 10*3/uL (ref 0.1–1.0)
Monocytes Relative: 9 %
Neutro Abs: 4 10*3/uL (ref 1.7–7.7)
Neutrophils Relative %: 66 %
Platelet Count: 184 10*3/uL (ref 150–400)
RBC: 4.12 MIL/uL (ref 3.87–5.11)
RDW: 12.6 % (ref 11.5–15.5)
WBC Count: 6 10*3/uL (ref 4.0–10.5)
nRBC: 0 % (ref 0.0–0.2)

## 2020-04-10 LAB — CMP (CANCER CENTER ONLY)
ALT: 27 U/L (ref 0–44)
AST: 30 U/L (ref 15–41)
Albumin: 3.8 g/dL (ref 3.5–5.0)
Alkaline Phosphatase: 93 U/L (ref 38–126)
Anion gap: 8 (ref 5–15)
BUN: 10 mg/dL (ref 8–23)
CO2: 26 mmol/L (ref 22–32)
Calcium: 9.5 mg/dL (ref 8.9–10.3)
Chloride: 105 mmol/L (ref 98–111)
Creatinine: 0.71 mg/dL (ref 0.44–1.00)
GFR, Est AFR Am: >60 mL/min (ref >60)
GFR, Estimated: >60 mL/min (ref >60)
Glucose, Bld: 112 mg/dL — ABNORMAL HIGH (ref 70–99)
Potassium: 4.2 mmol/L (ref 3.5–5.1)
Sodium: 139 mmol/L (ref 135–145)
Total Bilirubin: 1.4 mg/dL — ABNORMAL HIGH (ref 0.3–1.2)
Total Protein: 6.8 g/dL (ref 6.5–8.1)

## 2020-04-10 LAB — TSH: TSH: 5.904 u[IU]/mL — ABNORMAL HIGH (ref 0.308–3.960)

## 2020-04-10 MED ORDER — SODIUM CHLORIDE 0.9 % IV SOLN
480.0000 mg | Freq: Once | INTRAVENOUS | Status: AC
  Administered 2020-04-10: 480 mg via INTRAVENOUS
  Filled 2020-04-10: qty 48

## 2020-04-10 MED ORDER — SODIUM CHLORIDE 0.9% FLUSH
10.0000 mL | INTRAVENOUS | Status: DC | PRN
  Administered 2020-04-10: 10 mL
  Filled 2020-04-10: qty 10

## 2020-04-10 MED ORDER — SODIUM CHLORIDE 0.9 % IV SOLN
Freq: Once | INTRAVENOUS | Status: AC
  Filled 2020-04-10: qty 250

## 2020-04-10 MED ORDER — HEPARIN SOD (PORK) LOCK FLUSH 100 UNIT/ML IV SOLN
500.0000 [IU] | Freq: Once | INTRAVENOUS | Status: AC | PRN
  Administered 2020-04-10: 500 [IU]
  Filled 2020-04-10: qty 5

## 2020-04-10 MED ORDER — SODIUM CHLORIDE 0.9% FLUSH
10.0000 mL | INTRAVENOUS | Status: DC | PRN
  Administered 2020-04-10: 10 mL via INTRAVENOUS
  Filled 2020-04-10: qty 10

## 2020-04-10 NOTE — Patient Instructions (Signed)
[facility] Discharge Instructions for Patients Receiving Chemotherapy  Today you received the following chemotherapy agents: nivolumab (opdivo).  To help prevent nausea and vomiting after your treatment, we encourage you to take your nausea medication as directed.   If you develop nausea and vomiting that is not controlled by your nausea medication, call the clinic.   BELOW ARE SYMPTOMS THAT SHOULD BE REPORTED IMMEDIATELY:  *FEVER GREATER THAN 100.5 F  *CHILLS WITH OR WITHOUT FEVER  NAUSEA AND VOMITING THAT IS NOT CONTROLLED WITH YOUR NAUSEA MEDICATION  *UNUSUAL SHORTNESS OF BREATH  *UNUSUAL BRUISING OR BLEEDING  TENDERNESS IN MOUTH AND THROAT WITH OR WITHOUT PRESENCE OF ULCERS  *URINARY PROBLEMS  *BOWEL PROBLEMS  UNUSUAL RASH Items with * indicate a potential emergency and should be followed up as soon as possible.  Feel free to call the clinic should you have any questions or concerns. The clinic phone number is [phone].  Please show the [facility] at check-in to the Emergency Department and triage nurse.

## 2020-04-10 NOTE — Patient Instructions (Signed)

## 2020-04-12 ENCOUNTER — Other Ambulatory Visit: Payer: Self-pay

## 2020-04-12 ENCOUNTER — Telehealth: Payer: Self-pay | Admitting: Physician Assistant

## 2020-04-12 ENCOUNTER — Ambulatory Visit
Admission: RE | Admit: 2020-04-12 | Discharge: 2020-04-12 | Disposition: A | Discharge status: Home / Self Care | Payer: Medicare Other | Source: Ambulatory Visit | Attending: Internal Medicine | Admitting: Internal Medicine

## 2020-04-12 DIAGNOSIS — Z1231 Encounter for screening mammogram for malignant neoplasm of breast: Secondary | ICD-10-CM

## 2020-04-12 NOTE — Telephone Encounter (Signed)
Scheduled per los. Called and spoke with patient. Confirmed appt 

## 2020-04-23 ENCOUNTER — Other Ambulatory Visit: Payer: Self-pay | Admitting: Internal Medicine

## 2020-04-23 NOTE — Telephone Encounter (Signed)
Prescription refill request for Xarelto received.   Last office visit: [place], 03/09/2020 Weight: 66.6 kg Age: 74 y.o. Scr: 0.78, 03/13/2020 CrCl: 64 ml/min   Prescription refill sent.

## 2020-05-01 ENCOUNTER — Ambulatory Visit (INDEPENDENT_AMBULATORY_CARE_PROVIDER_SITE_OTHER): Payer: Medicare Other | Admitting: Emergency Medicine

## 2020-05-01 DIAGNOSIS — I495 Sick sinus syndrome: Secondary | ICD-10-CM | POA: Diagnosis not present

## 2020-05-01 LAB — CUP PACEART REMOTE DEVICE CHECK
Battery Remaining Longevity: 131 mo
Battery Remaining Percentage: 95.5 %
Battery Voltage: 3.01 V
Brady Statistic RV Percent Paced: 34 %
Date Time Interrogation Session: 20210928020022
Implantable Lead Implant Date: 20190329
Implantable Lead Implant Date: 20190329
Implantable Lead Location: 753859
Implantable Lead Location: 753860
Implantable Pulse Generator Implant Date: 20190329
Lead Channel Impedance Value: 530 Ohm
Lead Channel Pacing Threshold Amplitude: 0.75 V
Lead Channel Pacing Threshold Pulse Width: 0.5 ms
Lead Channel Sensing Intrinsic Amplitude: 12 mV
Lead Channel Setting Pacing Amplitude: 2.5 V
Lead Channel Setting Pacing Pulse Width: 0.5 ms
Lead Channel Setting Sensing Sensitivity: 2 mV
Pulse Gen Model: 2272
Pulse Gen Serial Number: 9003317

## 2020-05-04 NOTE — Progress Notes (Signed)
Remote pacemaker transmission.   

## 2020-05-07 ENCOUNTER — Inpatient Hospital Stay: Payer: Medicare Other | Attending: Internal Medicine

## 2020-05-07 ENCOUNTER — Other Ambulatory Visit: Payer: Self-pay

## 2020-05-07 ENCOUNTER — Ambulatory Visit (HOSPITAL_COMMUNITY)
Admission: RE | Admit: 2020-05-07 | Discharge: 2020-05-07 | Disposition: A | Discharge status: Home / Self Care | Payer: Medicare Other | Source: Ambulatory Visit | Attending: Physician Assistant | Admitting: Physician Assistant

## 2020-05-07 DIAGNOSIS — Z5112 Encounter for antineoplastic immunotherapy: Secondary | ICD-10-CM | POA: Diagnosis present

## 2020-05-07 DIAGNOSIS — C3491 Malignant neoplasm of unspecified part of right bronchus or lung: Secondary | ICD-10-CM | POA: Diagnosis present

## 2020-05-07 DIAGNOSIS — C3411 Malignant neoplasm of upper lobe, right bronchus or lung: Secondary | ICD-10-CM | POA: Insufficient documentation

## 2020-05-07 DIAGNOSIS — C773 Secondary and unspecified malignant neoplasm of axilla and upper limb lymph nodes: Secondary | ICD-10-CM | POA: Insufficient documentation

## 2020-05-07 DIAGNOSIS — E032 Hypothyroidism due to medicaments and other exogenous substances: Secondary | ICD-10-CM

## 2020-05-07 DIAGNOSIS — Z79899 Other long term (current) drug therapy: Secondary | ICD-10-CM | POA: Insufficient documentation

## 2020-05-07 LAB — CMP (CANCER CENTER ONLY)
ALT: 27 U/L (ref 0–44)
AST: 32 U/L (ref 15–41)
Albumin: 3.8 g/dL (ref 3.5–5.0)
Alkaline Phosphatase: 92 U/L (ref 38–126)
Anion gap: 3 — ABNORMAL LOW (ref 5–15)
BUN: 7 mg/dL — ABNORMAL LOW (ref 8–23)
CO2: 31 mmol/L (ref 22–32)
Calcium: 9.6 mg/dL (ref 8.9–10.3)
Chloride: 103 mmol/L (ref 98–111)
Creatinine: 0.82 mg/dL (ref 0.44–1.00)
GFR, Est AFR Am: >60 mL/min (ref >60)
GFR, Estimated: >60 mL/min (ref >60)
Glucose, Bld: 117 mg/dL — ABNORMAL HIGH (ref 70–99)
Potassium: 4.6 mmol/L (ref 3.5–5.1)
Sodium: 137 mmol/L (ref 135–145)
Total Bilirubin: 1.7 mg/dL — ABNORMAL HIGH (ref 0.3–1.2)
Total Protein: 6.8 g/dL (ref 6.5–8.1)

## 2020-05-07 LAB — CBC WITH DIFFERENTIAL (CANCER CENTER ONLY)
Abs Immature Granulocytes: 0.02 10*3/uL (ref 0.00–0.07)
Basophils Absolute: 0.1 10*3/uL (ref 0.0–0.1)
Basophils Relative: 1 %
Eosinophils Absolute: 0.2 10*3/uL (ref 0.0–0.5)
Eosinophils Relative: 2 %
HCT: 41.3 % (ref 36.0–46.0)
Hemoglobin: 14.1 g/dL (ref 12.0–15.0)
Immature Granulocytes: 0 %
Lymphocytes Relative: 19 %
Lymphs Abs: 1.3 10*3/uL (ref 0.7–4.0)
MCH: 32.4 pg (ref 26.0–34.0)
MCHC: 34.1 g/dL (ref 30.0–36.0)
MCV: 94.9 fL (ref 80.0–100.0)
Monocytes Absolute: 0.5 10*3/uL (ref 0.1–1.0)
Monocytes Relative: 8 %
Neutro Abs: 4.7 10*3/uL (ref 1.7–7.7)
Neutrophils Relative %: 70 %
Platelet Count: 177 10*3/uL (ref 150–400)
RBC: 4.35 MIL/uL (ref 3.87–5.11)
RDW: 12.9 % (ref 11.5–15.5)
WBC Count: 6.7 10*3/uL (ref 4.0–10.5)
nRBC: 0 % (ref 0.0–0.2)

## 2020-05-07 LAB — TSH: TSH: 6.241 u[IU]/mL — ABNORMAL HIGH (ref 0.308–3.960)

## 2020-05-07 MED ORDER — HEPARIN SOD (PORK) LOCK FLUSH 100 UNIT/ML IV SOLN
INTRAVENOUS | Status: AC
  Filled 2020-05-07: qty 5

## 2020-05-07 MED ORDER — IOHEXOL 300 MG/ML  SOLN
100.0000 mL | Freq: Once | INTRAMUSCULAR | Status: AC | PRN
  Administered 2020-05-07: 100 mL via INTRAVENOUS

## 2020-05-07 MED ORDER — HEPARIN SOD (PORK) LOCK FLUSH 100 UNIT/ML IV SOLN
500.0000 [IU] | Freq: Once | INTRAVENOUS | Status: AC
  Administered 2020-05-07: 500 [IU]

## 2020-05-08 ENCOUNTER — Inpatient Hospital Stay: Payer: Medicare Other

## 2020-05-08 ENCOUNTER — Telehealth: Payer: Self-pay | Admitting: Internal Medicine

## 2020-05-08 ENCOUNTER — Other Ambulatory Visit: Payer: Medicare Other

## 2020-05-08 ENCOUNTER — Encounter: Payer: Self-pay | Admitting: Internal Medicine

## 2020-05-08 ENCOUNTER — Other Ambulatory Visit: Payer: Self-pay

## 2020-05-08 ENCOUNTER — Inpatient Hospital Stay (HOSPITAL_BASED_OUTPATIENT_CLINIC_OR_DEPARTMENT_OTHER): Payer: Medicare Other | Admitting: Internal Medicine

## 2020-05-08 VITALS — BP 140/84 | HR 69 | Temp 98.8°F | Resp 20 | Ht 62.0 in | Wt 144.7 lb

## 2020-05-08 DIAGNOSIS — Z5112 Encounter for antineoplastic immunotherapy: Secondary | ICD-10-CM

## 2020-05-08 DIAGNOSIS — I495 Sick sinus syndrome: Secondary | ICD-10-CM

## 2020-05-08 DIAGNOSIS — C3491 Malignant neoplasm of unspecified part of right bronchus or lung: Secondary | ICD-10-CM

## 2020-05-08 MED ORDER — SODIUM CHLORIDE 0.9 % IV SOLN
480.0000 mg | Freq: Once | INTRAVENOUS | Status: AC
  Administered 2020-05-08: 480 mg via INTRAVENOUS
  Filled 2020-05-08: qty 48

## 2020-05-08 MED ORDER — SODIUM CHLORIDE 0.9% FLUSH
10.0000 mL | INTRAVENOUS | Status: DC | PRN
  Administered 2020-05-08: 10 mL
  Filled 2020-05-08: qty 10

## 2020-05-08 MED ORDER — HEPARIN SOD (PORK) LOCK FLUSH 100 UNIT/ML IV SOLN
500.0000 [IU] | Freq: Once | INTRAVENOUS | Status: AC | PRN
  Administered 2020-05-08: 500 [IU]
  Filled 2020-05-08: qty 5

## 2020-05-08 MED ORDER — SODIUM CHLORIDE 0.9 % IV SOLN
Freq: Once | INTRAVENOUS | Status: AC
  Filled 2020-05-08: qty 250

## 2020-05-08 NOTE — Patient Instructions (Signed)
[facility] Discharge Instructions for Patients Receiving Chemotherapy  Today you received the following chemotherapy agents Opdivo  To help prevent nausea and vomiting after your treatment, we encourage you to take your nausea medication as directed   If you develop nausea and vomiting that is not controlled by your nausea medication, call the clinic.   BELOW ARE SYMPTOMS THAT SHOULD BE REPORTED IMMEDIATELY:  *FEVER GREATER THAN 100.5 F  *CHILLS WITH OR WITHOUT FEVER  NAUSEA AND VOMITING THAT IS NOT CONTROLLED WITH YOUR NAUSEA MEDICATION  *UNUSUAL SHORTNESS OF BREATH  *UNUSUAL BRUISING OR BLEEDING  TENDERNESS IN MOUTH AND THROAT WITH OR WITHOUT PRESENCE OF ULCERS  *URINARY PROBLEMS  *BOWEL PROBLEMS  UNUSUAL RASH Items with * indicate a potential emergency and should be followed up as soon as possible.  Feel free to call the clinic should you have any questions or concerns. The clinic phone number is [phone].  Please show the CHEMO ALERT CARD at check-in to the Emergency Department and triage nurse.   

## 2020-05-08 NOTE — Progress Notes (Signed)
Per Dr. Worthy Flank office note, okay to proceed with treatment cycle 42 today.

## 2020-05-08 NOTE — Telephone Encounter (Signed)
Scheduled per 10/5 los. Printed avs and calendar for pt.

## 2020-05-08 NOTE — Progress Notes (Signed)
[facility] [phone]   [phone]  OFFICE PROGRESS NOTE  Harris, [name], [name] [address] [place] [address]  DIAGNOSIS: Metastatic non-small cell lung cancer, adenocarcinoma. This was initially diagnosed as stage IB (T2a, N0, M0) in March of 2014, status post right upper lobectomy with lymph node dissection but the patient has evidence for disease recurrence in the right axilla status post resection.  PRIOR THERAPY:  1) Status post right axillary lymph node biopsy on 05/01/2014 under the care of Dr. Roxan Hockey. 2) Systemic chemotherapy with carboplatin for AUC of 5 and Alimta 500 mg/M2 every 3 weeks. First cycle 05/31/2014. Status post 6 cycles. Last dose was given 09/22/2014. 3) palliative radiotherapy to the subcarinal lymph node under the care of Dr. Sondra Come. 4) Nivolumab 240 mg IV every 2 weeks. First dose 12/23/2016. Status post 6 cycles.  CURRENT THERAPY:  Nivolumab 480 mg IV every 4 weeks. First dose 03/17/2017. Status post 41 cycles.  INTERVAL HISTORY: Nancy Hodges 74 y.o. female returns to the clinic today for follow-up visit.  The patient is feeling fine today with no concerning complaints.  She denied having any chest pain, shortness of breath, cough or hemoptysis.  She denied having any nausea, vomiting, diarrhea or constipation.  She has no headache or visual changes.  The patient denied having any weight loss or night sweats.  She has been tolerating her treatment with nivolumab fairly well.  She had repeat CT scan of the chest, abdomen pelvis performed recently and she is here for evaluation and discussion of her scan results.   MEDICAL HISTORY: Past Medical History:  Diagnosis Date  . Allergic rhinitis   . Anxiety   . Arthritis   . Asthma   . Atrial flutter ([place])   . Cholelithiases 09/26/2015  . Diverticulosis   . Encounter for antineoplastic immunotherapy 12/09/2016  . GERD (gastroesophageal reflux disease)     . H/O hiatal hernia   . History of blood transfusion   . History of chemotherapy   . History of kidney stones   . History of radiation therapy   . Internal hemorrhoid   . Lung cancer ([facility])    a. Stage IB non-small cell carcinoma, s/p R VATS, wedge resection, RU lobectomy 10/2012.  [name] Persistent atrial fibrillation ([facility])    a. anticoagulated with Xarelto  . Pneumonia 06/2016   [facility]  . Presence of permanent cardiac pacemaker   . Pulmonary nodule   . 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    ALLERGIES:  is allergic to chlorpheniramine-dm, prednisone, septra [sulfamethoxazole-trimethoprim], vioxx [rofecoxib], molds & smuts, and penicillins.  MEDICATIONS:  Current Outpatient Medications  Medication Sig Dispense Refill  . acetaminophen (TYLENOL) 500 MG tablet Take 500 mg by mouth 3 (three) times daily as needed for moderate pain or fever.     [name] albuterol (PROVENTIL HFA;VENTOLIN HFA) 108 (90 BASE) MCG/ACT inhaler Inhale 2 puffs into the lungs every 6 (six) hours as needed for wheezing or shortness of breath.     [name] azelastine (ASTELIN) 0.1 % nasal spray Place 1 spray into both nostrils 2 (two) times daily. Use in each nostril as directed    . calcium carbonate (TUMS - DOSED IN MG ELEMENTAL CALCIUM) 500 MG chewable tablet Chew 1 tablet by mouth 2 (two) times daily as needed for indigestion or heartburn.     . diltiazem (CARTIA XT) 240 MG 24 hr capsule  Take 1 capsule (240 mg total) by mouth daily. 90 capsule 2  . dimenhyDRINATE (DRAMAMINE) 50 MG tablet Take 12.5 mg by mouth every 8 (eight) hours as needed for dizziness.    [name] ipratropium-albuterol (DUONEB) 0.5-2.5 (3) MG/3ML SOLN Take 3 mLs by nebulization every 6 (six) hours as needed (shortness of breath or wheezing).     [name] lidocaine-prilocaine (EMLA) cream APPLY TOPICALLY TO TO THE AFFECTED AREA AS NEEDED 30 g 4  . loratadine (CLARITIN) 10 MG tablet Take 10 mg by mouth daily.    [name]  OVER THE COUNTER MEDICATION Take 5 mLs by mouth daily as needed (cough). H[employer]'s kids cough syrup    . Polyvinyl Alcohol-Povidone (REFRESH OP) Place 1 drop into both eyes daily as needed (dry eyes).    [name] tablet TAKE 1 TABLET(20 MG) BY MOUTH DAILY WITH SUPPER 30 tablet 5   No current facility-administered medications for this visit.    SURGICAL HISTORY:  Past Surgical History:  Procedure Laterality Date  . CARDIOVERSION N/A 02/19/2018   Procedure: CARDIOVERSION;  Surgeon: Sanda Klein, MD;  Location: [facility];  Service: Cardiovascular;  Laterality: N/A;  . CARDIOVERSION N/A 04/30/2018   Procedure: CARDIOVERSION;  Surgeon: Thayer Headings, MD;  Location: [facility] END[facility]COPY;  Service: Cardiovascular;  Laterality: N/A;  . CATARACT EXTRACTION W/PHACO Right 08/14/2016   Procedure: CATARACT EXTRACTION PHACO AND INTRAOCULAR LENS PLACEMENT RIGHT EYE CDE 10.53;  Surgeon: Tonny Branch, MD;  Location: [facility];  Service: Ophthalmology;  Laterality: Right;  right  . CATARACT EXTRACTION W/PHACO Left 09/25/2016   Procedure: CATARACT EXTRACTION PHACO AND INTRAOCULAR LENS PLACEMENT (IOC);  Surgeon: Tonny Branch, MD;  Location: [facility];  Service: Ophthalmology;  Laterality: Left;  left CDE 8.81  . COLONOSCOPY     [facility]: cattered sigmoid diverticula, internal hemorrhoids, sessile polyp in ascending and mid transverse colon (tubular adenoma).   . colonoscopy with polypectomy  2015  . ESOPHAGOGASTRODUODENOSCOPY     [facility]: bile reflux in stomach, mild gastritis, hiatal hernia  . ESOPHAGOGASTRODUODENOSCOPY N/A 01/31/2016   Procedure: ESOPHAGOGASTRODUODENOSCOPY (EGD);  Surgeon: [name], MD;  Location: [facility];  Service: Endoscopy;  Laterality: N/A;  1200  . IR FLUORO GUIDE PORT INSERTION RIGHT  06/30/2017  . IR US GUIDE VASC ACCESS RIGHT  06/30/2017  . LOBECTOMY Right 10/27/2012   Procedure: LOBECTOMY;  Surgeon: [name], MD;  Location: [facility];  Service: Thoracic;   Laterality: Right;   RIGHT UPPER LOBECTOMY & Node Dissection  . LYMPH NODE BIOPSY Right 05/01/2014   Procedure: LYMPH NODE BIOPSY, Right Axillary;  Surgeon: [name], MD;  Location: [facility];  Service: Thoracic;  Laterality: Right;  . PACEMAKER IMPLANT N/A 10/30/2017   SJM Assurity MRI PPM implanted by Dr [name] once leadless pacemaker battery depleted  . PARTIAL HYSTERECTOMY    . PERMANENT PACEMAKER INSERTION N/A 09/14/2012   Nanostim (SJM) leadless pacemaker (LEADLESS II STUDY PATIENT) implanted by Dr [name], no longer functioning, device abandoned.  [name] VIDEO ASSISTED THORACOSCOPY (VATS)/WEDGE RESECTION Right 10/27/2012   Procedure: VIDEO ASSISTED THORACOSCOPY (VATS),RGHT UPPER LOBE LUNG WEDGE RESECTION;  Surgeon: [name], MD;  Location: [facility];  Service: Thoracic;  Laterality: Right;    REVIEW OF SYSTEMS:  Constitutional: negative Eyes: negative Ears, nose, mouth, throat, and face: negative Respiratory: negative Cardiovascular: negative Gastrointestinal: negative Genitourinary:negative Integument/breast: negative Hematologic/lymphatic: negative Musculoskeletal:negative Neurological: negative Behavioral/Psych: negative Endocrine: negative Allergic/Immunologic: negative   PHYSICAL EXAMINATION: General appearance: alert, cooperative and no distress Head:  Normocephalic, without obvious abnormality, atraumatic Neck: no adenopathy, no JVD, supple, symmetrical, trachea midline and thyroid not enlarged, symmetric, no tenderness/mass/nodules Lymph nodes: Cervical, supraclavicular, and axillary nodes normal. Resp: clear to auscultation bilaterally Back: symmetric, no curvature. ROM normal. No CVA tenderness. Cardio: regular rate and rhythm, S1, S2 normal, no murmur, click, rub or gallop GI: soft, non-tender; bowel sounds normal; no masses,  no organomegaly Extremities: extremities normal, atraumatic, no cyanosis or edema Neurologic: Alert and oriented X 3, normal strength  and tone. Normal symmetric reflexes. Normal coordination and gait  ECOG PERFORMANCE STATUS: 1 - Symptomatic but completely ambulatory  Blood pressure 140/84, pulse 69, temperature 98.8 F (37.1 C), temperature source Tympanic, resp. rate 20, height 5\' 2"  (1.575 m), weight 144 lb 11.2 oz (65.6 kg), SpO2 99 %.  LABORATORY DATA: Lab Results  Component Value Date   WBC 6.7 05/07/2020   HGB 14.1 05/07/2020   HCT 41.3 05/07/2020   MCV 94.9 05/07/2020   PLT 177 05/07/2020      Chemistry      Component Value Date/Time   NA 137 05/07/2020 0946   NA 139 08/05/2017 0902   K 4.6 05/07/2020 0946   K 4.1 08/05/2017 0902   CL 103 05/07/2020 0946   CO2 31 05/07/2020 0946   CO2 28 08/05/2017 0902   BUN 7 (L) 05/07/2020 0946   BUN 10.1 08/05/2017 0902   CREATININE 0.82 05/07/2020 0946   CREATININE 0.8 08/05/2017 0902   GLU 134 (H) 02/18/2017 1410      Component Value Date/Time   CALCIUM 9.6 05/07/2020 0946   CALCIUM 9.5 08/05/2017 0902   ALKPHOS 92 05/07/2020 0946   ALKPHOS 111 08/05/2017 0902   AST 32 05/07/2020 0946   AST 33 08/05/2017 0902   ALT 27 05/07/2020 0946   ALT 34 08/05/2017 0902   BILITOT 1.7 (H) 05/07/2020 0946   BILITOT 1.39 (H) 08/05/2017 0902       RADIOGRAPHIC STUDIES: CT Chest W Contrast  Result Date: 05/07/2020 CLINICAL DATA:  Restaging of non-small cell lung cancer. CURRENT THERAPY: Nivolumab 480 mg IV every 4 weeks. First dose 03/17/2017. Status post 40 cycles. EXAM: CT CHEST, ABDOMEN, AND PELVIS WITH CONTRAST TECHNIQUE: Multidetector CT imaging of the chest, abdomen and pelvis was performed following the standard protocol during bolus administration of intravenous contrast. CONTRAST:  170mL OMNIPAQUE IOHEXOL 300 MG/ML  SOLN COMPARISON:  Multiple exams, including 02/13/2020 FINDINGS: CT CHEST FINDINGS Cardiovascular: Right atrial lead less pacemaker. Dual lead left-sided pacer also noted (reportedly lead less pacer batteries depleted). Right Port-A-Cath tip:  Cavoatrial junction. Coronary, aortic arch, and branch vessel atherosclerotic vascular disease. Mediastinum/Nodes: Low-density subcarinal lymph node 1.2 cm in short axis on image 28/2, stable. No overt pathologic mediastinal adenopathy. Small type 1 hiatal hernia. Small uphill varices adjacent to the distal esophagus. Stable right internal mammary node 0.5 cm in short axis on image 22/2. Stable 0.4 cm left internal mammary lymph node, image 23 series 2. Prior right axillary dissection. Lungs/Pleura: Biapical pleuroparenchymal scarring. Stable scattered scarring in the right lung along with right lower lobe paramediastinal density favoring radiation fibrosis. Chronically stable ground-glass density nodule in the apical segment left upper lobe measuring about 0.9 by 0.5 cm on image 26/7, roughly stable from 08/26/2016. Centrilobular emphysema.  Chronic lingular scarring. Musculoskeletal: Unremarkable CT ABDOMEN PELVIS FINDINGS Hepatobiliary: Cirrhosis. Multiple gallstones in the gallbladder. No significant biliary dilatation. Collateral vessels tracking anterior to the left hepatic lobe. Pancreas: Unremarkable Spleen: The spleen measures 12.9 by 4.9 by  9.2 cm (volume = 300 cm^3), within normal size limits. Nonspecific 0.9 cm hypodense lesion in the inferior spleen on image 62/2. Adrenals/Urinary Tract: Scarring in the right kidney lower pole. Scarring in the left mid kidney posterolaterally. Mild scarring in the left kidney lower pole. Adrenal glands unremarkable. Stomach/Bowel: Sigmoid colon diverticulosis. Vascular/Lymphatic: Aortoiliac atherosclerotic vascular disease. Scattered right gastric, porta hepatis, and peripancreatic lymph nodes are likely reactive. Reproductive: Uterus absent.  Adnexa unremarkable. Other: No supplemental non-categorized findings. Musculoskeletal: Mild lumbar degenerative disc disease. IMPRESSION: 1. No findings of recurrent malignancy. 2. Chronically stable ground-glass density nodule in  the apical segment left upper lobe. 3. Other imaging findings of potential clinical significance: Coronary atherosclerosis. Small type 1 hiatal hernia. Small uphill varices adjacent to the distal esophagus. Cirrhosis with portal venous hypertension. Cholelithiasis. Sigmoid colon diverticulosis. Mild lumbar degenerative disc disease. 4. Emphysema and aortic atherosclerosis. Aortic Atherosclerosis (ICD10-I70.0) and Emphysema (ICD10-J43.9). Electronically Signed   By: Van Clines M.D.   On: 05/07/2020 13:21   CT Abdomen Pelvis W Contrast  Result Date: 05/07/2020 CLINICAL DATA:  Restaging of non-small cell lung cancer. CURRENT THERAPY: Nivolumab 480 mg IV every 4 weeks. First dose 03/17/2017. Status post 40 cycles. EXAM: CT CHEST, ABDOMEN, AND PELVIS WITH CONTRAST TECHNIQUE: Multidetector CT imaging of the chest, abdomen and pelvis was performed following the standard protocol during bolus administration of intravenous contrast. CONTRAST:  154mL OMNIPAQUE IOHEXOL 300 MG/ML  SOLN COMPARISON:  Multiple exams, including 02/13/2020 FINDINGS: CT CHEST FINDINGS Cardiovascular: Right atrial lead less pacemaker. Dual lead left-sided pacer also noted (reportedly lead less pacer batteries depleted). Right Port-A-Cath tip: Cavoatrial junction. Coronary, aortic arch, and branch vessel atherosclerotic vascular disease. Mediastinum/Nodes: Low-density subcarinal lymph node 1.2 cm in short axis on image 28/2, stable. No overt pathologic mediastinal adenopathy. Small type 1 hiatal hernia. Small uphill varices adjacent to the distal esophagus. Stable right internal mammary node 0.5 cm in short axis on image 22/2. Stable 0.4 cm left internal mammary lymph node, image 23 series 2. Prior right axillary dissection. Lungs/Pleura: Biapical pleuroparenchymal scarring. Stable scattered scarring in the right lung along with right lower lobe paramediastinal density favoring radiation fibrosis. Chronically stable ground-glass density  nodule in the apical segment left upper lobe measuring about 0.9 by 0.5 cm on image 26/7, roughly stable from 08/26/2016. Centrilobular emphysema.  Chronic lingular scarring. Musculoskeletal: Unremarkable CT ABDOMEN PELVIS FINDINGS Hepatobiliary: Cirrhosis. Multiple gallstones in the gallbladder. No significant biliary dilatation. Collateral vessels tracking anterior to the left hepatic lobe. Pancreas: Unremarkable Spleen: The spleen measures 12.9 by 4.9 by 9.2 cm (volume = 300 cm^3), within normal size limits. Nonspecific 0.9 cm hypodense lesion in the inferior spleen on image 62/2. Adrenals/Urinary Tract: Scarring in the right kidney lower pole. Scarring in the left mid kidney posterolaterally. Mild scarring in the left kidney lower pole. Adrenal glands unremarkable. Stomach/Bowel: Sigmoid colon diverticulosis. Vascular/Lymphatic: Aortoiliac atherosclerotic vascular disease. Scattered right gastric, porta hepatis, and peripancreatic lymph nodes are likely reactive. Reproductive: Uterus absent.  Adnexa unremarkable. Other: No supplemental non-categorized findings. Musculoskeletal: Mild lumbar degenerative disc disease. IMPRESSION: 1. No findings of recurrent malignancy. 2. Chronically stable ground-glass density nodule in the apical segment left upper lobe. 3. Other imaging findings of potential clinical significance: Coronary atherosclerosis. Small type 1 hiatal hernia. Small uphill varices adjacent to the distal esophagus. Cirrhosis with portal venous hypertension. Cholelithiasis. Sigmoid colon diverticulosis. Mild lumbar degenerative disc disease. 4. Emphysema and aortic atherosclerosis. Aortic Atherosclerosis (ICD10-I70.0) and Emphysema (ICD10-J43.9). Electronically Signed   By: Van Clines  M.D.   On: 05/07/2020 13:21   MM 3D SCREEN BREAST BILATERAL  Result Date: 04/13/2020 CLINICAL DATA:  Screening. EXAM: DIGITAL SCREENING BILATERAL MAMMOGRAM WITH TOMO AND CAD COMPARISON:  Previous exam(s). ACR  Breast Density Category c: The breast tissue is heterogeneously dense, which may obscure small masses. FINDINGS: There are no findings suspicious for malignancy. Images were processed with CAD. IMPRESSION: No mammographic evidence of malignancy. A result letter of this screening mammogram will be mailed directly to the patient. RECOMMENDATION: Screening mammogram in one year. (Code:[id number]) BI-RADS CATEGORY  1: Negative. Electronically Signed   By: Lajean Manes M.D.   On: 04/13/2020 15:18   CUP PACEART REMOTE DEVICE CHECK  Result Date: 05/01/2020 Scheduled remote reviewed. Normal device function.  Next remote 91 days- [employer], RN/CVRS  ASSESSMENT AND PLAN:  This is a very pleasant 74 years old white female with metastatic non-small cell lung cancer, adenocarcinoma with no actionable mutations status post systemic chemotherapy with carboplatin and Alimta and she is currently undergoing treatment with second line immunotherapy with Nivolumab status post 6 cycles. This was followed by immunotherapy with Nivolumab 480 mg IV every 4 weeks status post 41 cycles. The patient has been tolerating this treatment well with no concerning adverse effects. She had repeat CT scan of the chest, abdomen pelvis performed recently.  I personally and independently reviewed the scans and discussed the results with the patient today. Her scan showed no concerning findings for disease progression. I recommended for her to continue her current treatment with nivolumab and she will proceed with cycle #42 today. For the hypothyroidism, we will continue to monitor her TSH closely and adjust her dose of levothyroxine as needed. For the cardiac arrhythmia and hypertension she is followed by cardiology. The patient will come back for follow-up visit in 4 weeks for evaluation before the next cycle of her treatment. She was advised to call immediately if she has any concerning symptoms in the interval. The patient voices  understanding of current disease status and treatment options and is in agreement with the current care plan. All questions were answered. The patient knows to call the clinic with any problems, questions or concerns. We can certainly see the patient much sooner if necessary.  Disclaimer: This note was dictated with voice recognition software. Similar sounding words can inadvertently be transcribed and may not be corrected upon review.

## 2020-06-05 ENCOUNTER — Other Ambulatory Visit: Payer: Self-pay

## 2020-06-05 ENCOUNTER — Inpatient Hospital Stay: Payer: Medicare Other

## 2020-06-05 ENCOUNTER — Encounter: Payer: Self-pay | Admitting: Internal Medicine

## 2020-06-05 ENCOUNTER — Inpatient Hospital Stay: Payer: Medicare Other | Attending: Internal Medicine | Admitting: Internal Medicine

## 2020-06-05 VITALS — BP 151/86 | HR 75 | Temp 97.0°F | Resp 18 | Wt 147.0 lb

## 2020-06-05 DIAGNOSIS — C3491 Malignant neoplasm of unspecified part of right bronchus or lung: Secondary | ICD-10-CM

## 2020-06-05 DIAGNOSIS — Z79899 Other long term (current) drug therapy: Secondary | ICD-10-CM | POA: Insufficient documentation

## 2020-06-05 DIAGNOSIS — C773 Secondary and unspecified malignant neoplasm of axilla and upper limb lymph nodes: Secondary | ICD-10-CM | POA: Diagnosis present

## 2020-06-05 DIAGNOSIS — E032 Hypothyroidism due to medicaments and other exogenous substances: Secondary | ICD-10-CM

## 2020-06-05 DIAGNOSIS — C3411 Malignant neoplasm of upper lobe, right bronchus or lung: Secondary | ICD-10-CM | POA: Insufficient documentation

## 2020-06-05 DIAGNOSIS — Z95828 Presence of other vascular implants and grafts: Secondary | ICD-10-CM

## 2020-06-05 DIAGNOSIS — Z5112 Encounter for antineoplastic immunotherapy: Secondary | ICD-10-CM

## 2020-06-05 LAB — CMP (CANCER CENTER ONLY)
ALT: 32 U/L (ref 0–44)
AST: 39 U/L (ref 15–41)
Albumin: 4 g/dL (ref 3.5–5.0)
Alkaline Phosphatase: 112 U/L (ref 38–126)
Anion gap: 8 (ref 5–15)
BUN: 10 mg/dL (ref 8–23)
CO2: 28 mmol/L (ref 22–32)
Calcium: 9.7 mg/dL (ref 8.9–10.3)
Chloride: 103 mmol/L (ref 98–111)
Creatinine: 0.76 mg/dL (ref 0.44–1.00)
GFR, Estimated: >60 mL/min (ref >60)
Glucose, Bld: 162 mg/dL — ABNORMAL HIGH (ref 70–99)
Potassium: 4.5 mmol/L (ref 3.5–5.1)
Sodium: 139 mmol/L (ref 135–145)
Total Bilirubin: 1.3 mg/dL — ABNORMAL HIGH (ref 0.3–1.2)
Total Protein: 7.3 g/dL (ref 6.5–8.1)

## 2020-06-05 LAB — CBC WITH DIFFERENTIAL (CANCER CENTER ONLY)
Abs Immature Granulocytes: 0.01 10*3/uL (ref 0.00–0.07)
Basophils Absolute: 0 10*3/uL (ref 0.0–0.1)
Basophils Relative: 1 %
Eosinophils Absolute: 0 10*3/uL (ref 0.0–0.5)
Eosinophils Relative: 0 %
HCT: 40 % (ref 36.0–46.0)
Hemoglobin: 14 g/dL (ref 12.0–15.0)
Immature Granulocytes: 0 %
Lymphocytes Relative: 11 %
Lymphs Abs: 0.6 10*3/uL — ABNORMAL LOW (ref 0.7–4.0)
MCH: 33 pg (ref 26.0–34.0)
MCHC: 35 g/dL (ref 30.0–36.0)
MCV: 94.3 fL (ref 80.0–100.0)
Monocytes Absolute: 0.2 10*3/uL (ref 0.1–1.0)
Monocytes Relative: 4 %
Neutro Abs: 4.8 10*3/uL (ref 1.7–7.7)
Neutrophils Relative %: 84 %
Platelet Count: 193 10*3/uL (ref 150–400)
RBC: 4.24 MIL/uL (ref 3.87–5.11)
RDW: 13 % (ref 11.5–15.5)
WBC Count: 5.7 10*3/uL (ref 4.0–10.5)
nRBC: 0 % (ref 0.0–0.2)

## 2020-06-05 LAB — TSH: TSH: 7.856 u[IU]/mL — ABNORMAL HIGH (ref 0.308–3.960)

## 2020-06-05 MED ORDER — HEPARIN SOD (PORK) LOCK FLUSH 100 UNIT/ML IV SOLN
500.0000 [IU] | Freq: Once | INTRAVENOUS | Status: AC | PRN
  Administered 2020-06-05: 500 [IU]
  Filled 2020-06-05: qty 5

## 2020-06-05 MED ORDER — SODIUM CHLORIDE 0.9 % IV SOLN
480.0000 mg | Freq: Once | INTRAVENOUS | Status: AC
  Administered 2020-06-05: 480 mg via INTRAVENOUS
  Filled 2020-06-05: qty 48

## 2020-06-05 MED ORDER — SODIUM CHLORIDE 0.9 % IV SOLN
Freq: Once | INTRAVENOUS | Status: AC
  Filled 2020-06-05: qty 250

## 2020-06-05 MED ORDER — SODIUM CHLORIDE 0.9% FLUSH
10.0000 mL | INTRAVENOUS | Status: DC | PRN
  Administered 2020-06-05: 10 mL via INTRAVENOUS
  Filled 2020-06-05: qty 10

## 2020-06-05 MED ORDER — SODIUM CHLORIDE 0.9% FLUSH
10.0000 mL | INTRAVENOUS | Status: DC | PRN
  Administered 2020-06-05: 10 mL
  Filled 2020-06-05: qty 10

## 2020-06-05 NOTE — Patient Instructions (Signed)
[facility] Discharge Instructions for Patients Receiving Chemotherapy  Today you received the following chemotherapy agents :  Opdivo.  To help prevent nausea and vomiting after your treatment, we encourage you to take your nausea medication as prescribed.   If you develop nausea and vomiting that is not controlled by your nausea medication, call the clinic.   BELOW ARE SYMPTOMS THAT SHOULD BE REPORTED IMMEDIATELY:  *FEVER GREATER THAN 100.5 F  *CHILLS WITH OR WITHOUT FEVER  NAUSEA AND VOMITING THAT IS NOT CONTROLLED WITH YOUR NAUSEA MEDICATION  *UNUSUAL SHORTNESS OF BREATH  *UNUSUAL BRUISING OR BLEEDING  TENDERNESS IN MOUTH AND THROAT WITH OR WITHOUT PRESENCE OF ULCERS  *URINARY PROBLEMS  *BOWEL PROBLEMS  UNUSUAL RASH Items with * indicate a potential emergency and should be followed up as soon as possible.  Feel free to call the clinic should you have any questions or concerns. The clinic phone number is [phone].  Please show the CHEMO ALERT CARD at check-in to the Emergency Department and triage nurse.   

## 2020-06-05 NOTE — Progress Notes (Signed)
[place] [phone]   [phone]  OFFICE PROGRESS NOTE  Harris, [name], [name] [address] [place] [address]  DIAGNOSIS: Metastatic non-small cell lung cancer, adenocarcinoma. This was initially diagnosed as stage IB (T2a, N0, M0) in March of 2014, status post right upper lobectomy with lymph node dissection but the patient has evidence for disease recurrence in the right axilla status post resection.  PRIOR THERAPY:  1) Status post right axillary lymph node biopsy on 05/01/2014 under the care of Dr. Roxan Hockey. 2) Systemic chemotherapy with carboplatin for AUC of 5 and Alimta 500 mg/M2 every 3 weeks. First cycle 05/31/2014. Status post 6 cycles. Last dose was given 09/22/2014. 3) palliative radiotherapy to the subcarinal lymph node under the care of Dr. Sondra Come. 4) Nivolumab 240 mg IV every 2 weeks. First dose 12/23/2016. Status post 6 cycles.  CURRENT THERAPY:  Nivolumab 480 mg IV every 4 weeks. First dose 03/17/2017. Status post 41 cycles.  INTERVAL HISTORY: Nancy Hodges 74 y.o. female returns to the clinic today for follow-up visit.  The patient is feeling fine today with no concerning complaints except for sinus congestion.  She has been using Mucinex and Tylenol with some mild improvement.  She denied having any chest pain, shortness of breath but has mild cough and wheezing with no hemoptysis.  She denied having any fever or chills.  She has no significant weight loss or night sweats.  She has no headache or visual changes.  She has no nausea, vomiting, diarrhea or constipation.  She is here today for evaluation before starting cycle #42 of her treatment.   MEDICAL HISTORY: Past Medical History:  Diagnosis Date   Allergic rhinitis    Anxiety    Arthritis    Asthma    Atrial flutter ([place])    Cholelithiases 09/26/2015   Diverticulosis    Encounter for antineoplastic immunotherapy 12/09/2016   GERD  (gastroesophageal reflux disease)    H/O hiatal hernia    History of blood transfusion    History of chemotherapy    History of kidney stones    History of radiation therapy    Internal hemorrhoid    Lung cancer ([place])    a. Stage IB non-small cell carcinoma, s/p R VATS, wedge resection, RU lobectomy 10/2012.   Persistent atrial fibrillation ([facility])    a. anticoagulated with Xarelto   Pneumonia 06/2016   [facility]   Presence of permanent cardiac pacemaker    Pulmonary nodule    Tachycardia-bradycardia syndrome ([place])    a. 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    ALLERGIES:  is allergic to chlorpheniramine-dm, prednisone, septra [sulfamethoxazole-trimethoprim], vioxx [rofecoxib], molds & smuts, and penicillins.  MEDICATIONS:  Current Outpatient Medications  Medication Sig Dispense Refill   acetaminophen (TYLENOL) 500 MG tablet Take 500 mg by mouth 3 (three) times daily as needed for moderate pain or fever.      albuterol (PROVENTIL HFA;VENTOLIN HFA) 108 (90 BASE) MCG/ACT inhaler Inhale 2 puffs into the lungs every 6 (six) hours as needed for wheezing or shortness of breath.      azelastine (ASTELIN) 0.1 % nasal spray Place 1 spray into both nostrils 2 (two) times daily. Use in each nostril as directed     calcium carbonate (TUMS - DOSED IN MG ELEMENTAL CALCIUM) 500 MG chewable tablet Chew 1 tablet by mouth 2 (two) times daily as needed for indigestion or heartburn.      diltiazem (CARTIA XT)  240 MG 24 hr capsule Take 1 capsule (240 mg total) by mouth daily. 90 capsule 2   dimenhyDRINATE (DRAMAMINE) 50 MG tablet Take 12.5 mg by mouth every 8 (eight) hours as needed for dizziness.     ipratropium-albuterol (DUONEB) 0.5-2.5 (3) MG/3ML SOLN Take 3 mLs by nebulization every 6 (six) hours as needed (shortness of breath or wheezing).      lidocaine-prilocaine (EMLA) cream APPLY TOPICALLY TO TO THE AFFECTED AREA AS NEEDED 30 g 4   loratadine (CLARITIN) 10 MG tablet  Take 10 mg by mouth daily.     OVER THE COUNTER MEDICATION Take 5 mLs by mouth daily as needed (cough). [employer] kids cough syrup     Polyvinyl Alcohol-Povidone (REFRESH OP) Place 1 drop into both eyes daily as needed (dry eyes).     XARELTO 20 MG TABS tablet TAKE 1 TABLET(20 MG) BY MOUTH DAILY WITH SUPPER 30 tablet 5   No current facility-administered medications for this visit.   Facility-Administered Medications Ordered in Other Visits  Medication Dose Route Frequency Provider Last Rate Last Admin   sodium chloride flush (NS) 0.9 % injection 10 mL  10 mL Intravenous PRN Curt Bears, MD   10 mL at 06/05/20 1043    SURGICAL HISTORY:  Past Surgical History:  Procedure Laterality Date   CARDIOVERSION N/A 02/19/2018   Procedure: CARDIOVERSION;  Surgeon: Sanda Klein, MD;  Location: [facility] ENDOSCOPY;  Service: Cardiovascular;  Laterality: N/A;   CARDIOVERSION N/A 04/30/2018   Procedure: CARDIOVERSION;  Surgeon: Thayer Headings, MD;  Location: [facility] ENDOSCOPY;  Service: Cardiovascular;  Laterality: N/A;   CATARACT EXTRACTION W/PHACO Right 08/14/2016   Procedure: CATARACT EXTRACTION PHACO AND INTRAOCULAR LENS PLACEMENT RIGHT EYE CDE 10.53;  Surgeon: Tonny Branch, MD;  Location: [facility] ORS;  Service: Ophthalmology;  Laterality: Right;  right   CATARACT EXTRACTION W/PHACO Left 09/25/2016   Procedure: CATARACT EXTRACTION PHACO AND INTRAOCULAR LENS PLACEMENT (IOC);  Surgeon: Tonny Branch, MD;  Location: [facility] OR[facility];  Service: Ophthalmology;  Laterality: Left;  left CDE 8.81   COLONOSCOPY     [facility]: cattered sigmoid diverticula, internal hemorrhoids, sessile polyp in ascending and mid transverse colon (tubular adenoma).    colonoscopy with polypectomy  2015   ESOPHAGOGASTRODUODENOSCOPY     [facility]: bile reflux in stomach, mild gastritis, hiatal hernia   ESOPHAGOGASTRODUODENOSCOPY N/A 01/31/2016   Procedure: ESOPHAGOGASTRODUODENOSCOPY (EGD);  Surgeon: [name], MD;  Location: [facility];   Service: Endoscopy;  Laterality: N/A;  1200   IR FLUORO GUIDE PORT INSERTION RIGHT  06/30/2017   IR US GUIDE VASC ACCESS RIGHT  06/30/2017   LOBECTOMY Right 10/27/2012   Procedure: LOBECTOMY;  Surgeon: [name], MD;  Location: [facility];  Service: Thoracic;  Laterality: Right;   RIGHT UPPER LOBECTOMY & Node Dissection   LYMPH NODE BIOPSY Right 05/01/2014   Procedure: LYMPH NODE BIOPSY, Right Axillary;  Surgeon: [name], MD;  Location: [facility];  Service: Thoracic;  Laterality: Right;   PACEMAKER IMPLANT N/A 10/30/2017   SJM Assurity MRI PPM implanted by Dr [name] once leadless pacemaker battery depleted   PARTIAL HYSTERECTOMY     PERMANENT PACEMAKER INSERTION N/A 09/14/2012   Nanostim (SJM) leadless pacemaker (LEADLESS II STUDY PATIENT) implanted by Dr [name], no longer functioning, device abandoned.   VIDEO ASSISTED THORACOSCOPY (VATS)/WEDGE RESECTION Right 10/27/2012   Procedure: VIDEO ASSISTED THORACOSCOPY (VATS),RGHT UPPER LOBE LUNG WEDGE RESECTION;  Surgeon: [name], MD;  Location: [facility];  Service: Thoracic;  Laterality: Right;  REVIEW OF SYSTEMS:  A comprehensive review of systems was negative except for: Ears, nose, mouth, throat, and face: positive for nasal congestion Respiratory: positive for cough and wheezing   PHYSICAL EXAMINATION: General appearance: alert, cooperative and no distress Head: Normocephalic, without obvious abnormality, atraumatic Neck: no adenopathy, no JVD, supple, symmetrical, trachea midline and thyroid not enlarged, symmetric, no tenderness/mass/nodules Lymph nodes: Cervical, supraclavicular, and axillary nodes normal. Resp: clear to auscultation bilaterally Back: symmetric, no curvature. ROM normal. No CVA tenderness. Cardio: regular rate and rhythm, S1, S2 normal, no murmur, click, rub or gallop GI: soft, non-tender; bowel sounds normal; no masses,  no organomegaly Extremities: extremities normal, atraumatic, no  cyanosis or edema  ECOG PERFORMANCE STATUS: 1 - Symptomatic but completely ambulatory  Blood pressure (!) 151/86, pulse 75, temperature (!) 97 F (36.1 C), temperature source Oral, resp. rate 18, weight 147 lb 0.2 oz (66.7 kg), SpO2 97 %.  LABORATORY DATA: Lab Results  Component Value Date   WBC 5.7 06/05/2020   HGB 14.0 06/05/2020   HCT 40.0 06/05/2020   MCV 94.3 06/05/2020   PLT 193 06/05/2020      Chemistry      Component Value Date/Time   NA 137 05/07/2020 0946   NA 139 08/05/2017 0902   K 4.6 05/07/2020 0946   K 4.1 08/05/2017 0902   CL 103 05/07/2020 0946   CO2 31 05/07/2020 0946   CO2 28 08/05/2017 0902   BUN 7 (L) 05/07/2020 0946   BUN 10.1 08/05/2017 0902   CREATININE 0.82 05/07/2020 0946   CREATININE 0.8 08/05/2017 0902   GLU 134 (H) 02/18/2017 1410      Component Value Date/Time   CALCIUM 9.6 05/07/2020 0946   CALCIUM 9.5 08/05/2017 0902   ALKPHOS 92 05/07/2020 0946   ALKPHOS 111 08/05/2017 0902   AST 32 05/07/2020 0946   AST 33 08/05/2017 0902   ALT 27 05/07/2020 0946   ALT 34 08/05/2017 0902   BILITOT 1.7 (H) 05/07/2020 0946   BILITOT 1.39 (H) 08/05/2017 0902       RADIOGRAPHIC STUDIES: CT Chest W Contrast  Result Date: 05/07/2020 CLINICAL DATA:  Restaging of non-small cell lung cancer. CURRENT THERAPY: Nivolumab 480 mg IV every 4 weeks. First dose 03/17/2017. Status post 40 cycles. EXAM: CT CHEST, ABDOMEN, AND PELVIS WITH CONTRAST TECHNIQUE: Multidetector CT imaging of the chest, abdomen and pelvis was performed following the standard protocol during bolus administration of intravenous contrast. CONTRAST:  152mL OMNIPAQUE IOHEXOL 300 MG/ML  SOLN COMPARISON:  Multiple exams, including 02/13/2020 FINDINGS: CT CHEST FINDINGS Cardiovascular: Right atrial lead less pacemaker. Dual lead left-sided pacer also noted (reportedly lead less pacer batteries depleted). Right Port-A-Cath tip: Cavoatrial junction. Coronary, aortic arch, and branch vessel  atherosclerotic vascular disease. Mediastinum/Nodes: Low-density subcarinal lymph node 1.2 cm in short axis on image 28/2, stable. No overt pathologic mediastinal adenopathy. Small type 1 hiatal hernia. Small uphill varices adjacent to the distal esophagus. Stable right internal mammary node 0.5 cm in short axis on image 22/2. Stable 0.4 cm left internal mammary lymph node, image 23 series 2. Prior right axillary dissection. Lungs/Pleura: Biapical pleuroparenchymal scarring. Stable scattered scarring in the right lung along with right lower lobe paramediastinal density favoring radiation fibrosis. Chronically stable ground-glass density nodule in the apical segment left upper lobe measuring about 0.9 by 0.5 cm on image 26/7, roughly stable from 08/26/2016. Centrilobular emphysema.  Chronic lingular scarring. Musculoskeletal: Unremarkable CT ABDOMEN PELVIS FINDINGS Hepatobiliary: Cirrhosis. Multiple gallstones in the gallbladder. No significant  biliary dilatation. Collateral vessels tracking anterior to the left hepatic lobe. Pancreas: Unremarkable Spleen: The spleen measures 12.9 by 4.9 by 9.2 cm (volume = 300 cm^3), within normal size limits. Nonspecific 0.9 cm hypodense lesion in the inferior spleen on image 62/2. Adrenals/Urinary Tract: Scarring in the right kidney lower pole. Scarring in the left mid kidney posterolaterally. Mild scarring in the left kidney lower pole. Adrenal glands unremarkable. Stomach/Bowel: Sigmoid colon diverticulosis. Vascular/Lymphatic: Aortoiliac atherosclerotic vascular disease. Scattered right gastric, porta hepatis, and peripancreatic lymph nodes are likely reactive. Reproductive: Uterus absent.  Adnexa unremarkable. Other: No supplemental non-categorized findings. Musculoskeletal: Mild lumbar degenerative disc disease. IMPRESSION: 1. No findings of recurrent malignancy. 2. Chronically stable ground-glass density nodule in the apical segment left upper lobe. 3. Other imaging findings  of potential clinical significance: Coronary atherosclerosis. Small type 1 hiatal hernia. Small uphill varices adjacent to the distal esophagus. Cirrhosis with portal venous hypertension. Cholelithiasis. Sigmoid colon diverticulosis. Mild lumbar degenerative disc disease. 4. Emphysema and aortic atherosclerosis. Aortic Atherosclerosis (ICD10-I70.0) and Emphysema (ICD10-J43.9). Electronically Signed   By: Van Clines M.D.   On: 05/07/2020 13:21   CT Abdomen Pelvis W Contrast  Result Date: 05/07/2020 CLINICAL DATA:  Restaging of non-small cell lung cancer. CURRENT THERAPY: Nivolumab 480 mg IV every 4 weeks. First dose 03/17/2017. Status post 40 cycles. EXAM: CT CHEST, ABDOMEN, AND PELVIS WITH CONTRAST TECHNIQUE: Multidetector CT imaging of the chest, abdomen and pelvis was performed following the standard protocol during bolus administration of intravenous contrast. CONTRAST:  125mL OMNIPAQUE IOHEXOL 300 MG/ML  SOLN COMPARISON:  Multiple exams, including 02/13/2020 FINDINGS: CT CHEST FINDINGS Cardiovascular: Right atrial lead less pacemaker. Dual lead left-sided pacer also noted (reportedly lead less pacer batteries depleted). Right Port-A-Cath tip: Cavoatrial junction. Coronary, aortic arch, and branch vessel atherosclerotic vascular disease. Mediastinum/Nodes: Low-density subcarinal lymph node 1.2 cm in short axis on image 28/2, stable. No overt pathologic mediastinal adenopathy. Small type 1 hiatal hernia. Small uphill varices adjacent to the distal esophagus. Stable right internal mammary node 0.5 cm in short axis on image 22/2. Stable 0.4 cm left internal mammary lymph node, image 23 series 2. Prior right axillary dissection. Lungs/Pleura: Biapical pleuroparenchymal scarring. Stable scattered scarring in the right lung along with right lower lobe paramediastinal density favoring radiation fibrosis. Chronically stable ground-glass density nodule in the apical segment left upper lobe measuring about 0.9  by 0.5 cm on image 26/7, roughly stable from 08/26/2016. Centrilobular emphysema.  Chronic lingular scarring. Musculoskeletal: Unremarkable CT ABDOMEN PELVIS FINDINGS Hepatobiliary: Cirrhosis. Multiple gallstones in the gallbladder. No significant biliary dilatation. Collateral vessels tracking anterior to the left hepatic lobe. Pancreas: Unremarkable Spleen: The spleen measures 12.9 by 4.9 by 9.2 cm (volume = 300 cm^3), within normal size limits. Nonspecific 0.9 cm hypodense lesion in the inferior spleen on image 62/2. Adrenals/Urinary Tract: Scarring in the right kidney lower pole. Scarring in the left mid kidney posterolaterally. Mild scarring in the left kidney lower pole. Adrenal glands unremarkable. Stomach/Bowel: Sigmoid colon diverticulosis. Vascular/Lymphatic: Aortoiliac atherosclerotic vascular disease. Scattered right gastric, porta hepatis, and peripancreatic lymph nodes are likely reactive. Reproductive: Uterus absent.  Adnexa unremarkable. Other: No supplemental non-categorized findings. Musculoskeletal: Mild lumbar degenerative disc disease. IMPRESSION: 1. No findings of recurrent malignancy. 2. Chronically stable ground-glass density nodule in the apical segment left upper lobe. 3. Other imaging findings of potential clinical significance: Coronary atherosclerosis. Small type 1 hiatal hernia. Small uphill varices adjacent to the distal esophagus. Cirrhosis with portal venous hypertension. Cholelithiasis. Sigmoid colon diverticulosis. Mild lumbar degenerative  disc disease. 4. Emphysema and aortic atherosclerosis. Aortic Atherosclerosis (ICD10-I70.0) and Emphysema (ICD10-J43.9). Electronically Signed   By: Van Clines M.D.   On: 05/07/2020 13:21   ASSESSMENT AND PLAN:  This is a very pleasant 74 years old white female with metastatic non-small cell lung cancer, adenocarcinoma with no actionable mutations status post systemic chemotherapy with carboplatin and Alimta and she is currently  undergoing treatment with second line immunotherapy with Nivolumab status post 6 cycles. This was followed by immunotherapy with Nivolumab 480 mg IV every 4 weeks status post 42 cycles. The patient continues to tolerate her treatment well with no concerning adverse effects. I recommended her to proceed with cycle #43 today as planned. For the nasal and sinus congestion, she will add Claritin to her current treatment. For the hypothyroidism, we will continue to monitor her TSH closely and adjust her dose of levothyroxine as needed. For the cardiac arrhythmia and hypertension she is followed by cardiology. The patient was advised to call immediately if she has any concerning symptoms in the interval. The patient voices understanding of current disease status and treatment options and is in agreement with the current care plan. All questions were answered. The patient knows to call the clinic with any problems, questions or concerns. We can certainly see the patient much sooner if necessary.  Disclaimer: This note was dictated with voice recognition software. Similar sounding words can inadvertently be transcribed and may not be corrected upon review.

## 2020-06-13 ENCOUNTER — Other Ambulatory Visit: Payer: Self-pay | Admitting: Internal Medicine

## 2020-06-27 ENCOUNTER — Telehealth: Payer: Self-pay | Admitting: *Deleted

## 2020-06-27 NOTE — Telephone Encounter (Signed)
I called to check on Nancy Hodges.  I was unable to reach but did leave a vm message with my name and to call if she needed anything.

## 2020-07-03 ENCOUNTER — Other Ambulatory Visit: Payer: Self-pay

## 2020-07-03 ENCOUNTER — Inpatient Hospital Stay: Payer: Medicare Other

## 2020-07-03 ENCOUNTER — Encounter: Payer: Self-pay | Admitting: Internal Medicine

## 2020-07-03 ENCOUNTER — Inpatient Hospital Stay (HOSPITAL_BASED_OUTPATIENT_CLINIC_OR_DEPARTMENT_OTHER): Payer: Medicare Other | Admitting: Internal Medicine

## 2020-07-03 VITALS — BP 130/75 | HR 90 | Temp 98.2°F | Resp 20 | Ht 62.0 in | Wt 146.5 lb

## 2020-07-03 DIAGNOSIS — E032 Hypothyroidism due to medicaments and other exogenous substances: Secondary | ICD-10-CM

## 2020-07-03 DIAGNOSIS — Z5112 Encounter for antineoplastic immunotherapy: Secondary | ICD-10-CM | POA: Diagnosis not present

## 2020-07-03 DIAGNOSIS — C3491 Malignant neoplasm of unspecified part of right bronchus or lung: Secondary | ICD-10-CM | POA: Diagnosis not present

## 2020-07-03 LAB — CBC WITH DIFFERENTIAL (CANCER CENTER ONLY)
Abs Immature Granulocytes: 0.02 10*3/uL (ref 0.00–0.07)
Basophils Absolute: 0 10*3/uL (ref 0.0–0.1)
Basophils Relative: 1 %
Eosinophils Absolute: 0.2 10*3/uL (ref 0.0–0.5)
Eosinophils Relative: 3 %
HCT: 38.1 % (ref 36.0–46.0)
Hemoglobin: 13.3 g/dL (ref 12.0–15.0)
Immature Granulocytes: 0 %
Lymphocytes Relative: 23 %
Lymphs Abs: 1.5 10*3/uL (ref 0.7–4.0)
MCH: 33.5 pg (ref 26.0–34.0)
MCHC: 34.9 g/dL (ref 30.0–36.0)
MCV: 96 fL (ref 80.0–100.0)
Monocytes Absolute: 0.5 10*3/uL (ref 0.1–1.0)
Monocytes Relative: 7 %
Neutro Abs: 4.3 10*3/uL (ref 1.7–7.7)
Neutrophils Relative %: 66 %
Platelet Count: 174 10*3/uL (ref 150–400)
RBC: 3.97 MIL/uL (ref 3.87–5.11)
RDW: 13.1 % (ref 11.5–15.5)
WBC Count: 6.5 10*3/uL (ref 4.0–10.5)
nRBC: 0 % (ref 0.0–0.2)

## 2020-07-03 LAB — CMP (CANCER CENTER ONLY)
ALT: 31 U/L (ref 0–44)
AST: 36 U/L (ref 15–41)
Albumin: 3.7 g/dL (ref 3.5–5.0)
Alkaline Phosphatase: 97 U/L (ref 38–126)
Anion gap: 7 (ref 5–15)
BUN: 10 mg/dL (ref 8–23)
CO2: 29 mmol/L (ref 22–32)
Calcium: 9.6 mg/dL (ref 8.9–10.3)
Chloride: 105 mmol/L (ref 98–111)
Creatinine: 0.73 mg/dL (ref 0.44–1.00)
GFR, Estimated: >60 mL/min (ref >60)
Glucose, Bld: 137 mg/dL — ABNORMAL HIGH (ref 70–99)
Potassium: 4.3 mmol/L (ref 3.5–5.1)
Sodium: 141 mmol/L (ref 135–145)
Total Bilirubin: 1.2 mg/dL (ref 0.3–1.2)
Total Protein: 6.8 g/dL (ref 6.5–8.1)

## 2020-07-03 LAB — TSH: TSH: 8.183 u[IU]/mL — ABNORMAL HIGH (ref 0.308–3.960)

## 2020-07-03 MED ORDER — SODIUM CHLORIDE 0.9 % IV SOLN
480.0000 mg | Freq: Once | INTRAVENOUS | Status: AC
  Administered 2020-07-03: 480 mg via INTRAVENOUS
  Filled 2020-07-03: qty 48

## 2020-07-03 MED ORDER — SODIUM CHLORIDE 0.9% FLUSH
10.0000 mL | INTRAVENOUS | Status: DC | PRN
  Administered 2020-07-03: 10 mL
  Filled 2020-07-03: qty 10

## 2020-07-03 MED ORDER — SODIUM CHLORIDE 0.9 % IV SOLN
Freq: Once | INTRAVENOUS | Status: AC
  Filled 2020-07-03: qty 250

## 2020-07-03 MED ORDER — HEPARIN SOD (PORK) LOCK FLUSH 100 UNIT/ML IV SOLN
500.0000 [IU] | Freq: Once | INTRAVENOUS | Status: AC | PRN
  Administered 2020-07-03: 500 [IU]
  Filled 2020-07-03: qty 5

## 2020-07-03 NOTE — Patient Instructions (Signed)
[facility] Discharge Instructions for Patients Receiving Chemotherapy  Today you received the following chemotherapy agents Opdivo  To help prevent nausea and vomiting after your treatment, we encourage you to take your nausea medication as directed   If you develop nausea and vomiting that is not controlled by your nausea medication, call the clinic.   BELOW ARE SYMPTOMS THAT SHOULD BE REPORTED IMMEDIATELY:  *FEVER GREATER THAN 100.5 F  *CHILLS WITH OR WITHOUT FEVER  NAUSEA AND VOMITING THAT IS NOT CONTROLLED WITH YOUR NAUSEA MEDICATION  *UNUSUAL SHORTNESS OF BREATH  *UNUSUAL BRUISING OR BLEEDING  TENDERNESS IN MOUTH AND THROAT WITH OR WITHOUT PRESENCE OF ULCERS  *URINARY PROBLEMS  *BOWEL PROBLEMS  UNUSUAL RASH Items with * indicate a potential emergency and should be followed up as soon as possible.  Feel free to call the clinic should you have any questions or concerns. The clinic phone number is [phone].  Please show the CHEMO ALERT CARD at check-in to the Emergency Department and triage nurse.   

## 2020-07-03 NOTE — Progress Notes (Signed)
[facility] Telephone:([phone]   Fax:(336) [phone]  OFFICE PROGRESS NOTE  Harris, [name], [name] [address] [place] [address]  DIAGNOSIS: Metastatic non-small cell lung cancer, adenocarcinoma. This was initially diagnosed as stage IB (T2a, N0, M0) in March of 2014, status post right upper lobectomy with lymph node dissection but the patient has evidence for disease recurrence in the right axilla status post resection.  PRIOR THERAPY:  1) Status post right axillary lymph node biopsy on 05/01/2014 under the care of Dr. Roxan Hockey. 2) Systemic chemotherapy with carboplatin for AUC of 5 and Alimta 500 mg/M2 every 3 weeks. First cycle 05/31/2014. Status post 6 cycles. Last dose was given 09/22/2014. 3) palliative radiotherapy to the subcarinal lymph node under the care of Dr. Sondra Come. 4) Nivolumab 240 mg IV every 2 weeks. First dose 12/23/2016. Status post 6 cycles.  CURRENT THERAPY:  Nivolumab 480 mg IV every 4 weeks. First dose 03/17/2017. Status post 43 cycles.  INTERVAL HISTORY: Nancy Hodges 74 y.o. female returns to the clinic today for follow-up visit. The patient is feeling fine today with no concerning complaints except for mild cough productive of whitish sputum. She denied having any chest pain, shortness of breath, cough or hemoptysis. She denied having any fever or chills. She has no nausea, vomiting, diarrhea or constipation. She denied having any headache or visual changes. She continues to tolerate her treatment with nivolumab fairly well. She is here today for evaluation before starting cycle #43.  MEDICAL HISTORY: Past Medical History:  Diagnosis Date  . Allergic rhinitis   . Anxiety   . Arthritis   . Asthma   . Atrial flutter ([place])   . Cholelithiases 09/26/2015  . Diverticulosis   . Encounter for antineoplastic immunotherapy 12/09/2016  . GERD (gastroesophageal reflux disease)   . H/O hiatal hernia   . History of blood  transfusion   . History of chemotherapy   . History of kidney stones   . History of radiation therapy   . Internal hemorrhoid   . Lung cancer ([place])    a. Stage IB non-small cell carcinoma, s/p R VATS, wedge resection, RU lobectomy 10/2012.  [name] Persistent atrial fibrillation (HCC)    a. anticoagulated with Xarelto  . Pneumonia 06/2016   [facility]  . Presence of permanent cardiac pacemaker   . Pulmonary nodule   . Tachycardia-bradycardia syndrome ([place])    a. 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    ALLERGIES:  is allergic to chlorpheniramine-dm, prednisone, septra [sulfamethoxazole-trimethoprim], vioxx [rofecoxib], molds & smuts, and penicillins.  MEDICATIONS:  Current Outpatient Medications  Medication Sig Dispense Refill  . acetaminophen (TYLENOL) 500 MG tablet Take 500 mg by mouth 3 (three) times daily as needed for moderate pain or fever.     [name] albuterol (PROVENTIL HFA;VENTOLIN HFA) 108 (90 BASE) MCG/ACT inhaler Inhale 2 puffs into the lungs every 6 (six) hours as needed for wheezing or shortness of breath.     [name] azelastine (ASTELIN) 0.1 % nasal spray Place 1 spray into both nostrils 2 (two) times daily. Use in each nostril as directed    . calcium carbonate (TUMS - DOSED IN MG ELEMENTAL CALCIUM) 500 MG chewable tablet Chew 1 tablet by mouth 2 (two) times daily as needed for indigestion or heartburn.     [name] CARTIA XT 240 MG 24 hr capsule TAKE 1 CAPSULE(240 MG) BY MOUTH DAILY 90 capsule 2  . dimenhyDRINATE (DRAMAMINE) 50 MG tablet Take 12.5  mg by mouth every 8 (eight) hours as needed for dizziness.    [name] ipratropium-albuterol (DUONEB) 0.5-2.5 (3) MG/3ML SOLN Take 3 mLs by nebulization every 6 (six) hours as needed (shortness of breath or wheezing).     [name] lidocaine-prilocaine (EMLA) cream APPLY TOPICALLY TO TO THE AFFECTED AREA AS NEEDED 30 g 4  . loratadine (CLARITIN) 10 MG tablet Take 10 mg by mouth daily.    [name] OVER THE COUNTER MEDICATION Take 5 mLs by mouth daily as needed  (cough). Hyland's kids cough syrup    . Polyvinyl Alcohol-Povidone (REFRESH OP) Place 1 drop into both eyes daily as needed (dry eyes).    [name] tablet TAKE 1 TABLET(20 MG) BY MOUTH DAILY WITH SUPPER 30 tablet 5   No current facility-administered medications for this visit.    SURGICAL HISTORY:  Past Surgical History:  Procedure Laterality Date  . CARDIOVERSION N/A 02/19/2018   Procedure: CARDIOVERSION;  Surgeon: Sanda Klein, MD;  Location: [facility];  Service: Cardiovascular;  Laterality: N/A;  . CARDIOVERSION N/A 04/30/2018   Procedure: CARDIOVERSION;  Surgeon: Thayer Headings, MD;  Location: [facility] ENDOSCOPY;  Service: Cardiovascular;  Laterality: N/A;  . CATARACT EXTRACTION W/PHACO Right 08/14/2016   Procedure: CATARACT EXTRACTION PHACO AND INTRAOCULAR LENS PLACEMENT RIGHT EYE CDE 10.53;  Surgeon: Tonny Branch, MD;  Location: [facility];  Service: Ophthalmology;  Laterality: Right;  right  . CATARACT EXTRACTION W/PHACO Left 09/25/2016   Procedure: CATARACT EXTRACTION PHACO AND INTRAOCULAR LENS PLACEMENT (IOC);  Surgeon: Tonny Branch, MD;  Location: [facility];  Service: Ophthalmology;  Laterality: Left;  left CDE 8.81  . COLONOSCOPY     [facility]: cattered sigmoid diverticula, internal hemorrhoids, sessile polyp in ascending and mid transverse colon (tubular adenoma).   . colonoscopy with polypectomy  2015  . ESOPHAGOGASTRODUODENOSCOPY     [facility]: bile reflux in stomach, mild gastritis, hiatal hernia  . ESOPHAGOGASTRODUODENOSCOPY N/A 01/31/2016   Procedure: ESOPHAGOGASTRODUODENOSCOPY (EGD);  Surgeon: [name], MD;  [facility];  Service: Endoscopy;  Laterality: N/A;  1200  . IR FLUORO GUIDE PORT INSERTION RIGHT  06/30/2017  . IR US GUIDE VASC ACCESS RIGHT  06/30/2017  . LOBECTOMY Right 10/27/2012   Procedure: LOBECTOMY;  Surgeon: [name], MD;  Location: [facility];  Service: Thoracic;  Laterality: Right;   RIGHT UPPER LOBECTOMY & Node Dissection  . LYMPH  NODE BIOPSY Right 05/01/2014   Procedure: LYMPH NODE BIOPSY, Right Axillary;  Surgeon: [name], MD;  Location: [facility];  Service: Thoracic;  Laterality: Right;  . PACEMAKER IMPLANT N/A 10/30/2017   SJM Assurity MRI PPM implanted by Dr [name] once leadless pacemaker battery depleted  . PARTIAL HYSTERECTOMY    . PERMANENT PACEMAKER INSERTION N/A 09/14/2012   Nanostim (SJM) leadless pacemaker (LEADLESS II STUDY PATIENT) implanted by Dr [name], no longer functioning, device abandoned.  [name] VIDEO ASSISTED THORACOSCOPY (VATS)/WEDGE RESECTION Right 10/27/2012   Procedure: VIDEO ASSISTED THORACOSCOPY (VATS),RGHT UPPER LOBE LUNG WEDGE RESECTION;  Surgeon: [name], MD;  Location: [facility];  Service: Thoracic;  Laterality: Right;    REVIEW OF SYSTEMS:  A comprehensive review of systems was negative except for: Respiratory: positive for cough   PHYSICAL EXAMINATION: General appearance: alert, cooperative and no distress Head: Normocephalic, without obvious abnormality, atraumatic Neck: no adenopathy, no JVD, supple, symmetrical, trachea midline and thyroid not enlarged, symmetric, no tenderness/mass/nodules Lymph nodes: Cervical, supraclavicular, and axillary nodes normal. Resp: clear to auscultation bilaterally Back: symmetric, no curvature. ROM  normal. No CVA tenderness. Cardio: regular rate and rhythm, S1, S2 normal, no murmur, click, rub or gallop GI: soft, non-tender; bowel sounds normal; no masses,  no organomegaly Extremities: extremities normal, atraumatic, no cyanosis or edema  ECOG PERFORMANCE STATUS: 1 - Symptomatic but completely ambulatory  Blood pressure 130/75, pulse 90, temperature 98.2 F (36.8 C), temperature source Tympanic, resp. rate 20, height 5\' 2"  (1.575 m), weight 146 lb 8 oz (66.5 kg), SpO2 100 %.  LABORATORY DATA: Lab Results  Component Value Date   WBC 6.5 07/03/2020   HGB 13.3 07/03/2020   HCT 38.1 07/03/2020   MCV 96.0 07/03/2020   PLT 174  07/03/2020      Chemistry      Component Value Date/Time   NA 141 07/03/2020 1048   NA 139 08/05/2017 0902   K 4.3 07/03/2020 1048   K 4.1 08/05/2017 0902   CL 105 07/03/2020 1048   CO2 29 07/03/2020 1048   CO2 28 08/05/2017 0902   BUN 10 07/03/2020 1048   BUN 10.1 08/05/2017 0902   CREATININE 0.73 07/03/2020 1048   CREATININE 0.8 08/05/2017 0902   GLU 134 (H) 02/18/2017 1410      Component Value Date/Time   CALCIUM 9.6 07/03/2020 1048   CALCIUM 9.5 08/05/2017 0902   ALKPHOS 97 07/03/2020 1048   ALKPHOS 111 08/05/2017 0902   AST 36 07/03/2020 1048   AST 33 08/05/2017 0902   ALT 31 07/03/2020 1048   ALT 34 08/05/2017 0902   BILITOT 1.2 07/03/2020 1048   BILITOT 1.39 (H) 08/05/2017 0902       RADIOGRAPHIC STUDIES: No results found. ASSESSMENT AND PLAN:  This is a very pleasant 74 years old white female with metastatic non-small cell lung cancer, adenocarcinoma with no actionable mutations status post systemic chemotherapy with carboplatin and Alimta and she is currently undergoing treatment with second line immunotherapy with Nivolumab status post 6 cycles. This was followed by immunotherapy with Nivolumab 480 mg IV every 4 weeks status post 43 cycles. She continues to tolerate her treatment well with no concerning adverse effects. I recommended for her to proceed with cycle #44 today as planned. For the nasal and sinus congestion, she will add Claritin to her current treatment. For the hypothyroidism, we will continue to monitor her TSH closely and adjust her dose of levothyroxine as needed. For the cardiac arrhythmia and hypertension she is followed by cardiology. I will see her back for follow-up visit in 4 weeks for evaluation before the next cycle of her treatment. She was advised to call immediately if she has any concerning symptoms in the interval. The patient voices understanding of current disease status and treatment options and is in agreement with the current  care plan. All questions were answered. The patient knows to call the clinic with any problems, questions or concerns. We can certainly see the patient much sooner if necessary.  Disclaimer: This note was dictated with voice recognition software. Similar sounding words can inadvertently be transcribed and may not be corrected upon review.

## 2020-07-03 NOTE — Progress Notes (Signed)
Pt stable at time of discharge. 

## 2020-07-31 ENCOUNTER — Telehealth: Payer: Self-pay | Admitting: Internal Medicine

## 2020-07-31 ENCOUNTER — Encounter: Payer: Self-pay | Admitting: Internal Medicine

## 2020-07-31 ENCOUNTER — Inpatient Hospital Stay: Payer: Medicare Other

## 2020-07-31 ENCOUNTER — Inpatient Hospital Stay: Payer: Medicare Other | Attending: Internal Medicine | Admitting: Internal Medicine

## 2020-07-31 ENCOUNTER — Other Ambulatory Visit: Payer: Self-pay

## 2020-07-31 VITALS — BP 141/70 | HR 83 | Temp 98.8°F | Resp 18 | Ht 62.0 in | Wt 145.2 lb

## 2020-07-31 DIAGNOSIS — Z79899 Other long term (current) drug therapy: Secondary | ICD-10-CM | POA: Insufficient documentation

## 2020-07-31 DIAGNOSIS — C3411 Malignant neoplasm of upper lobe, right bronchus or lung: Secondary | ICD-10-CM | POA: Insufficient documentation

## 2020-07-31 DIAGNOSIS — C773 Secondary and unspecified malignant neoplasm of axilla and upper limb lymph nodes: Secondary | ICD-10-CM | POA: Diagnosis present

## 2020-07-31 DIAGNOSIS — Z95828 Presence of other vascular implants and grafts: Secondary | ICD-10-CM

## 2020-07-31 DIAGNOSIS — C3490 Malignant neoplasm of unspecified part of unspecified bronchus or lung: Secondary | ICD-10-CM | POA: Diagnosis not present

## 2020-07-31 DIAGNOSIS — Z5112 Encounter for antineoplastic immunotherapy: Secondary | ICD-10-CM | POA: Diagnosis present

## 2020-07-31 DIAGNOSIS — C3491 Malignant neoplasm of unspecified part of right bronchus or lung: Secondary | ICD-10-CM

## 2020-07-31 DIAGNOSIS — E032 Hypothyroidism due to medicaments and other exogenous substances: Secondary | ICD-10-CM

## 2020-07-31 LAB — CMP (CANCER CENTER ONLY)
ALT: 26 U/L (ref 0–44)
AST: 34 U/L (ref 15–41)
Albumin: 3.7 g/dL (ref 3.5–5.0)
Alkaline Phosphatase: 94 U/L (ref 38–126)
Anion gap: 4 — ABNORMAL LOW (ref 5–15)
BUN: 11 mg/dL (ref 8–23)
CO2: 29 mmol/L (ref 22–32)
Calcium: 9.5 mg/dL (ref 8.9–10.3)
Chloride: 105 mmol/L (ref 98–111)
Creatinine: 0.73 mg/dL (ref 0.44–1.00)
GFR, Estimated: >60 mL/min (ref >60)
Glucose, Bld: 120 mg/dL — ABNORMAL HIGH (ref 70–99)
Potassium: 4.2 mmol/L (ref 3.5–5.1)
Sodium: 138 mmol/L (ref 135–145)
Total Bilirubin: 1.3 mg/dL — ABNORMAL HIGH (ref 0.3–1.2)
Total Protein: 6.9 g/dL (ref 6.5–8.1)

## 2020-07-31 LAB — CBC WITH DIFFERENTIAL (CANCER CENTER ONLY)
Abs Immature Granulocytes: 0.01 10*3/uL (ref 0.00–0.07)
Basophils Absolute: 0 10*3/uL (ref 0.0–0.1)
Basophils Relative: 1 %
Eosinophils Absolute: 0.2 10*3/uL (ref 0.0–0.5)
Eosinophils Relative: 2 %
HCT: 37.5 % (ref 36.0–46.0)
Hemoglobin: 13.2 g/dL (ref 12.0–15.0)
Immature Granulocytes: 0 %
Lymphocytes Relative: 19 %
Lymphs Abs: 1.2 10*3/uL (ref 0.7–4.0)
MCH: 33.6 pg (ref 26.0–34.0)
MCHC: 35.2 g/dL (ref 30.0–36.0)
MCV: 95.4 fL (ref 80.0–100.0)
Monocytes Absolute: 0.5 10*3/uL (ref 0.1–1.0)
Monocytes Relative: 8 %
Neutro Abs: 4.6 10*3/uL (ref 1.7–7.7)
Neutrophils Relative %: 70 %
Platelet Count: 190 10*3/uL (ref 150–400)
RBC: 3.93 MIL/uL (ref 3.87–5.11)
RDW: 13.2 % (ref 11.5–15.5)
WBC Count: 6.6 10*3/uL (ref 4.0–10.5)
nRBC: 0 % (ref 0.0–0.2)

## 2020-07-31 LAB — TSH: TSH: 6.155 u[IU]/mL — ABNORMAL HIGH (ref 0.308–3.960)

## 2020-07-31 MED ORDER — SODIUM CHLORIDE 0.9% FLUSH
10.0000 mL | INTRAVENOUS | Status: DC | PRN
  Administered 2020-07-31: 13:00:00 10 mL
  Filled 2020-07-31: qty 10

## 2020-07-31 MED ORDER — SODIUM CHLORIDE 0.9 % IV SOLN
Freq: Once | INTRAVENOUS | Status: AC
  Filled 2020-07-31: qty 250

## 2020-07-31 MED ORDER — SODIUM CHLORIDE 0.9 % IV SOLN
480.0000 mg | Freq: Once | INTRAVENOUS | Status: AC
  Administered 2020-07-31: 12:00:00 480 mg via INTRAVENOUS
  Filled 2020-07-31: qty 48

## 2020-07-31 MED ORDER — HEPARIN SOD (PORK) LOCK FLUSH 100 UNIT/ML IV SOLN
500.0000 [IU] | Freq: Once | INTRAVENOUS | Status: AC | PRN
  Administered 2020-07-31: 13:00:00 500 [IU]
  Filled 2020-07-31: qty 5

## 2020-07-31 MED ORDER — SODIUM CHLORIDE 0.9% FLUSH
10.0000 mL | INTRAVENOUS | Status: DC | PRN
  Administered 2020-07-31: 10:00:00 10 mL via INTRAVENOUS
  Filled 2020-07-31: qty 10

## 2020-07-31 NOTE — Telephone Encounter (Signed)
Scheduled appointments per 12/28 los. Spoke to patient who is aware of appointments dates and times.

## 2020-07-31 NOTE — Progress Notes (Signed)
[facility] Telephone:(336) [phone]   Fax:(336) [phone]  OFFICE PROGRESS NOTE  Harris, [name], [name] [address] [place] [address]  DIAGNOSIS: Metastatic non-small cell lung cancer, adenocarcinoma. This was initially diagnosed as stage IB (T2a, N0, M0) in March of 2014, status post right upper lobectomy with lymph node dissection but the patient has evidence for disease recurrence in the right axilla status post resection.  PRIOR THERAPY:  1) Status post right axillary lymph node biopsy on 05/01/2014 under the care of Dr. Roxan Hockey. 2) Systemic chemotherapy with carboplatin for AUC of 5 and Alimta 500 mg/M2 every 3 weeks. First cycle 05/31/2014. Status post 6 cycles. Last dose was given 09/22/2014. 3) palliative radiotherapy to the subcarinal lymph node under the care of Dr. Sondra Come. 4) Nivolumab 240 mg IV every 2 weeks. First dose 12/23/2016. Status post 6 cycles.  CURRENT THERAPY:  Nivolumab 480 mg IV every 4 weeks. First dose 03/17/2017. Status post 44 cycles.  INTERVAL HISTORY: Nancy Hodges 74 y.o. female returns to the clinic today for follow-up visit.  The patient is feeling fine today with no concerning complaints.  She denied having any current chest pain, shortness of breath, cough or hemoptysis.  She denied having any fever or chills.  She has no nausea, vomiting, diarrhea or constipation.  She has no headache or visual changes.  She continues to tolerate her treatment with immunotherapy fairly well.  The patient is here today for evaluation before starting cycle #45.  MEDICAL HISTORY: Past Medical History:  Diagnosis Date  . Allergic rhinitis   . Anxiety   . Arthritis   . Asthma   . Atrial flutter ([place])   . Cholelithiases 09/26/2015  . Diverticulosis   . Encounter for antineoplastic immunotherapy 12/09/2016  . GERD (gastroesophageal reflux disease)   . H/O hiatal hernia   . History of blood transfusion   . History of chemotherapy   .  History of kidney stones   . History of radiation therapy   . Internal hemorrhoid   . Lung cancer ([place])    a. Stage IB non-small cell carcinoma, s/p R VATS, wedge resection, RU lobectomy 10/2012.  [name] Persistent atrial fibrillation (HCC)    a. anticoagulated with Xarelto  . Pneumonia 06/2016   [facility]  . Presence of permanent cardiac pacemaker   . Pulmonary nodule   . Tachycardia-bradycardia syndrome ([place])    a. 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    ALLERGIES:  is allergic to chlorpheniramine-dm, prednisone, septra [sulfamethoxazole-trimethoprim], vioxx [rofecoxib], molds & smuts, and penicillins.  MEDICATIONS:  Current Outpatient Medications  Medication Sig Dispense Refill  . acetaminophen (TYLENOL) 500 MG tablet Take 500 mg by mouth 3 (three) times daily as needed for moderate pain or fever.     [name] albuterol (PROVENTIL HFA;VENTOLIN HFA) 108 (90 BASE) MCG/ACT inhaler Inhale 2 puffs into the lungs every 6 (six) hours as needed for wheezing or shortness of breath.     [name] azelastine (ASTELIN) 0.1 % nasal spray Place 1 spray into both nostrils 2 (two) times daily. Use in each nostril as directed    . calcium carbonate (TUMS - DOSED IN MG ELEMENTAL CALCIUM) 500 MG chewable tablet Chew 1 tablet by mouth 2 (two) times daily as needed for indigestion or heartburn.     [name] CARTIA XT 240 MG 24 hr capsule TAKE 1 CAPSULE(240 MG) BY MOUTH DAILY 90 capsule 2  . dimenhyDRINATE (DRAMAMINE) 50 MG tablet Take 12.5  mg by mouth every 8 (eight) hours as needed for dizziness.    [name] ipratropium-albuterol (DUONEB) 0.5-2.5 (3) MG/3ML SOLN Take 3 mLs by nebulization every 6 (six) hours as needed (shortness of breath or wheezing).     [name] lidocaine-prilocaine (EMLA) cream APPLY TOPICALLY TO TO THE AFFECTED AREA AS NEEDED 30 g 4  . loratadine (CLARITIN) 10 MG tablet Take 10 mg by mouth daily.    [name] OVER THE COUNTER MEDICATION Take 5 mLs by mouth daily as needed (cough). Hyland's kids cough syrup    .  Polyvinyl Alcohol-Povidone (REFRESH OP) Place 1 drop into both eyes daily as needed (dry eyes).    [name] tablet TAKE 1 TABLET(20 MG) BY MOUTH DAILY WITH SUPPER 30 tablet 5   No current facility-administered medications for this visit.    SURGICAL HISTORY:  Past Surgical History:  Procedure Laterality Date  . CARDIOVERSION N/A 02/19/2018   Procedure: CARDIOVERSION;  Surgeon: Sanda Klein, MD;  Location: [facility] ENDOSCOPY;  Service: Cardiovascular;  Laterality: N/A;  . CARDIOVERSION N/A 04/30/2018   Procedure: CARDIOVERSION;  Surgeon: Thayer Headings, MD;  Location: [facility] ENDOSCOPY;  Service: Cardiovascular;  Laterality: N/A;  . CATARACT EXTRACTION W/PHACO Right 08/14/2016   Procedure: CATARACT EXTRACTION PHACO AND INTRAOCULAR LENS PLACEMENT RIGHT EYE CDE 10.53;  Surgeon: Tonny Branch, MD;  [facility];  Service: Ophthalmology;  Laterality: Right;  right  . CATARACT EXTRACTION W/PHACO Left 09/25/2016   Procedure: CATARACT EXTRACTION PHACO AND INTRAOCULAR LENS PLACEMENT (IOC);  Surgeon: Tonny Branch, MD;  Location: [facility];  Service: Ophthalmology;  Laterality: Left;  left CDE 8.81  . COLONOSCOPY     [facility]: cattered sigmoid diverticula, internal hemorrhoids, sessile polyp in ascending and mid transverse colon (tubular adenoma).   . colonoscopy with polypectomy  2015  . ESOPHAGOGASTRODUODENOSCOPY     [facility]: bile reflux in stomach, mild gastritis, hiatal hernia  . ESOPHAGOGASTRODUODENOSCOPY N/A 01/31/2016   Procedure: ESOPHAGOGASTRODUODENOSCOPY (EGD);  Surgeon: [name], MD;  [facility];  Service: Endoscopy;  Laterality: N/A;  1200  . IR FLUORO GUIDE PORT INSERTION RIGHT  06/30/2017  . IR US GUIDE VASC ACCESS RIGHT  06/30/2017  . LOBECTOMY Right 10/27/2012   Procedure: LOBECTOMY;  Surgeon: [name], MD;  Location: [facility];  Service: Thoracic;  Laterality: Right;   RIGHT UPPER LOBECTOMY & Node Dissection  . LYMPH NODE BIOPSY Right 05/01/2014   Procedure:  LYMPH NODE BIOPSY, Right Axillary;  Surgeon: [name], MD;  Location: [place] [facility];  Service: Thoracic;  Laterality: Right;  . PACEMAKER IMPLANT N/A 10/30/2017   SJM Assurity MRI PPM implanted by Dr [name] once leadless pacemaker battery depleted  . PARTIAL HYSTERECTOMY    . PERMANENT PACEMAKER INSERTION N/A 09/14/2012   Nanostim (SJM) leadless pacemaker (LEADLESS II STUDY PATIENT) implanted by Dr [name], no longer functioning, device abandoned.  [name] VIDEO ASSISTED THORACOSCOPY (VATS)/WEDGE RESECTION Right 10/27/2012   Procedure: VIDEO ASSISTED THORACOSCOPY (VATS),RGHT UPPER LOBE LUNG WEDGE RESECTION;  Surgeon: [name], MD;  Location: [facility];  Service: Thoracic;  Laterality: Right;    REVIEW OF SYSTEMS:  A comprehensive review of systems was negative.   PHYSICAL EXAMINATION: General appearance: alert, cooperative and no distress Head: Normocephalic, without obvious abnormality, atraumatic Neck: no adenopathy, no JVD, supple, symmetrical, trachea midline and thyroid not enlarged, symmetric, no tenderness/mass/nodules Lymph nodes: Cervical, supraclavicular, and axillary nodes normal. Resp: clear to auscultation bilaterally Back: symmetric, no curvature. ROM normal. No CVA tenderness. Cardio: regular  rate and rhythm, S1, S2 normal, no murmur, click, rub or gallop GI: soft, non-tender; bowel sounds normal; no masses,  no organomegaly Extremities: extremities normal, atraumatic, no cyanosis or edema  ECOG PERFORMANCE STATUS: 1 - Symptomatic but completely ambulatory  Blood pressure (!) 141/70, pulse 83, temperature 98.8 F (37.1 C), temperature source Tympanic, resp. rate 18, height 5\' 2"  (1.575 m), weight 145 lb 3.2 oz (65.9 kg), SpO2 97 %.  LABORATORY DATA: Lab Results  Component Value Date   WBC 6.6 07/31/2020   HGB 13.2 07/31/2020   HCT 37.5 07/31/2020   MCV 95.4 07/31/2020   PLT 190 07/31/2020      Chemistry      Component Value Date/Time   NA 141 07/03/2020 1048    NA 139 08/05/2017 0902   K 4.3 07/03/2020 1048   K 4.1 08/05/2017 0902   CL 105 07/03/2020 1048   CO2 29 07/03/2020 1048   CO2 28 08/05/2017 0902   BUN 10 07/03/2020 1048   BUN 10.1 08/05/2017 0902   CREATININE 0.73 07/03/2020 1048   CREATININE 0.8 08/05/2017 0902   GLU 134 (H) 02/18/2017 1410      Component Value Date/Time   CALCIUM 9.6 07/03/2020 1048   CALCIUM 9.5 08/05/2017 0902   ALKPHOS 97 07/03/2020 1048   ALKPHOS 111 08/05/2017 0902   AST 36 07/03/2020 1048   AST 33 08/05/2017 0902   ALT 31 07/03/2020 1048   ALT 34 08/05/2017 0902   BILITOT 1.2 07/03/2020 1048   BILITOT 1.39 (H) 08/05/2017 0902       RADIOGRAPHIC STUDIES: No results found. ASSESSMENT AND PLAN:  This is a very pleasant 74 years old white female with metastatic non-small cell lung cancer, adenocarcinoma with no actionable mutations status post systemic chemotherapy with carboplatin and Alimta and she is currently undergoing treatment with second line immunotherapy with Nivolumab status post 6 cycles. This was followed by immunotherapy with Nivolumab 480 mg IV every 4 weeks status post 44 cycles. The patient continues to tolerate this treatment well with no concerning adverse effects. I recommended for the patient to proceed with cycle #45 today as planned. For the hypothyroidism, we will continue to monitor her TSH closely and adjust her dose of levothyroxine as needed. For the cardiac arrhythmia and hypertension she is followed by cardiology. I will see the patient back for follow-up visit in 4 weeks for evaluation with repeat CT scan of the chest, abdomen pelvis for restaging of her disease. She was advised to call immediately if she has any concerning symptoms in the interval. The patient voices understanding of current disease status and treatment options and is in agreement with the current care plan. All questions were answered. The patient knows to call the clinic with any problems, questions or  concerns. We can certainly see the patient much sooner if necessary.  Disclaimer: This note was dictated with voice recognition software. Similar sounding words can inadvertently be transcribed and may not be corrected upon review.

## 2020-07-31 NOTE — Patient Instructions (Signed)
[facility] Discharge Instructions for Patients Receiving Chemotherapy  Today you received the following chemotherapy agents: Nivolumab (Opdivo)  To help prevent nausea and vomiting after your treatment, we encourage you to take your nausea medication as prescribed.    If you develop nausea and vomiting that is not controlled by your nausea medication, call the clinic.   BELOW ARE SYMPTOMS THAT SHOULD BE REPORTED IMMEDIATELY:  *FEVER GREATER THAN 100.5 F  *CHILLS WITH OR WITHOUT FEVER  NAUSEA AND VOMITING THAT IS NOT CONTROLLED WITH YOUR NAUSEA MEDICATION  *UNUSUAL SHORTNESS OF BREATH  *UNUSUAL BRUISING OR BLEEDING  TENDERNESS IN MOUTH AND THROAT WITH OR WITHOUT PRESENCE OF ULCERS  *URINARY PROBLEMS  *BOWEL PROBLEMS  UNUSUAL RASH Items with * indicate a potential emergency and should be followed up as soon as possible.  Feel free to call the clinic should you have any questions or concerns. The clinic phone number is [phone].  Please show the CHEMO ALERT CARD at check-in to the Emergency Department and triage nurse.   

## 2020-08-01 ENCOUNTER — Telehealth: Payer: Self-pay | Admitting: *Deleted

## 2020-08-01 ENCOUNTER — Ambulatory Visit (INDEPENDENT_AMBULATORY_CARE_PROVIDER_SITE_OTHER): Payer: Medicare Other

## 2020-08-01 DIAGNOSIS — I495 Sick sinus syndrome: Secondary | ICD-10-CM

## 2020-08-01 LAB — CUP PACEART REMOTE DEVICE CHECK
Battery Remaining Longevity: 131 mo
Battery Remaining Percentage: 95.5 %
Battery Voltage: 3.02 V
Brady Statistic RV Percent Paced: 34 %
Date Time Interrogation Session: 20211229020026
Implantable Lead Implant Date: 20190329
Implantable Lead Implant Date: 20190329
Implantable Lead Location: 753859
Implantable Lead Location: 753860
Implantable Pulse Generator Implant Date: 20190329
Lead Channel Impedance Value: 580 Ohm
Lead Channel Pacing Threshold Amplitude: 0.75 V
Lead Channel Pacing Threshold Pulse Width: 0.5 ms
Lead Channel Sensing Intrinsic Amplitude: 12 mV
Lead Channel Setting Pacing Amplitude: 2.5 V
Lead Channel Setting Pacing Pulse Width: 0.5 ms
Lead Channel Setting Sensing Sensitivity: 2 mV
Pulse Gen Model: 2272
Pulse Gen Serial Number: 9003317

## 2020-08-01 NOTE — Telephone Encounter (Signed)
I received a vm message from Ms. Czajkowski today.  She states she forgot to get her contrast and needed help with that.  I called [facility] and was told she could come there to pick it up.  I called Ms. Girtman back and spoke to her.  I updated her that she can go to [facility] dept to pick up her contrast.  She was thankful for the help.

## 2020-08-15 NOTE — Progress Notes (Signed)
Remote pacemaker transmission.   

## 2020-08-23 ENCOUNTER — Other Ambulatory Visit: Payer: Self-pay | Admitting: Physician Assistant

## 2020-08-23 DIAGNOSIS — R5383 Other fatigue: Secondary | ICD-10-CM

## 2020-08-23 DIAGNOSIS — C3490 Malignant neoplasm of unspecified part of unspecified bronchus or lung: Secondary | ICD-10-CM

## 2020-08-24 ENCOUNTER — Inpatient Hospital Stay: Payer: Medicare Other

## 2020-08-24 ENCOUNTER — Encounter: Payer: Self-pay | Admitting: *Deleted

## 2020-08-24 ENCOUNTER — Ambulatory Visit (HOSPITAL_COMMUNITY): Payer: Medicare Other

## 2020-08-24 ENCOUNTER — Other Ambulatory Visit: Payer: Medicare Other

## 2020-08-24 NOTE — Progress Notes (Signed)
Patient called and left a vm message for me to call. I called and spoke to Ms. Lograsso. Her transportation cancelled on her and she is unable to make her ct scan today.  I called central scheduling and tried to get her re-scheduled for Monday next week.  I was unable to get her scheduled but they will notify me if they have a cancellation.  I called and updated Ms. Fuhrman.  I explained that we may have to change her appt next week depending on her scan appt. She verbalized understanding.

## 2020-08-28 ENCOUNTER — Inpatient Hospital Stay: Payer: Medicare Other | Admitting: Internal Medicine

## 2020-08-28 ENCOUNTER — Inpatient Hospital Stay: Payer: Medicare Other

## 2020-08-28 ENCOUNTER — Telehealth: Payer: Self-pay | Admitting: *Deleted

## 2020-08-28 ENCOUNTER — Other Ambulatory Visit: Payer: Medicare Other

## 2020-08-28 NOTE — Telephone Encounter (Signed)
I called to see if Nancy Hodges got her covid test back.  She states not yet and it could be 3 days.  I told her to call me Monday next week and we could figure out when she needs to come back to see Dr. Julien Nordmann based on her covid results.  She verbalized understanding.

## 2020-08-28 NOTE — Telephone Encounter (Signed)
I followed up to see if patient had her scan.  She did not get her scan and she is not feeling well today. She got a covid test but does not have results yet.  I will update Dr. Julien Nordmann and staff.  I will send notification to get her re-scheduled next week with a scan.

## 2020-09-03 ENCOUNTER — Telehealth: Payer: Self-pay | Admitting: *Deleted

## 2020-09-03 NOTE — Telephone Encounter (Signed)
Nancy Hodges called this am.  She had missed her scan and follow up with Dr. Julien Nordmann. She is set up in a few weeks for a follow up. She states she needs scan. I called to get her an appt. I called her back with an update on appt, pre-procedure instructions with contrast and NPO 4 hours before. She was thankful for the help.

## 2020-09-05 ENCOUNTER — Encounter: Payer: Self-pay | Admitting: Medical Oncology

## 2020-09-05 ENCOUNTER — Telehealth: Payer: Self-pay | Admitting: Medical Oncology

## 2020-09-05 NOTE — Telephone Encounter (Signed)
COVID test was negative    Requested letter to dismiss her from [employer]. Letter mailed to [facility] [place] .

## 2020-09-21 ENCOUNTER — Other Ambulatory Visit: Payer: Self-pay

## 2020-09-21 ENCOUNTER — Inpatient Hospital Stay: Payer: Medicare Other

## 2020-09-21 ENCOUNTER — Encounter (HOSPITAL_COMMUNITY): Payer: Self-pay

## 2020-09-21 ENCOUNTER — Ambulatory Visit (HOSPITAL_COMMUNITY)
Admission: RE | Admit: 2020-09-21 | Discharge: 2020-09-21 | Disposition: A | Discharge status: Home / Self Care | Payer: Medicare Other | Source: Ambulatory Visit | Attending: Internal Medicine | Admitting: Internal Medicine

## 2020-09-21 ENCOUNTER — Inpatient Hospital Stay: Payer: Medicare Other | Attending: Internal Medicine

## 2020-09-21 DIAGNOSIS — Z5112 Encounter for antineoplastic immunotherapy: Secondary | ICD-10-CM | POA: Insufficient documentation

## 2020-09-21 DIAGNOSIS — Z95828 Presence of other vascular implants and grafts: Secondary | ICD-10-CM

## 2020-09-21 DIAGNOSIS — E032 Hypothyroidism due to medicaments and other exogenous substances: Secondary | ICD-10-CM | POA: Insufficient documentation

## 2020-09-21 DIAGNOSIS — C3490 Malignant neoplasm of unspecified part of unspecified bronchus or lung: Secondary | ICD-10-CM | POA: Insufficient documentation

## 2020-09-21 DIAGNOSIS — R5383 Other fatigue: Secondary | ICD-10-CM

## 2020-09-21 DIAGNOSIS — Z79899 Other long term (current) drug therapy: Secondary | ICD-10-CM | POA: Insufficient documentation

## 2020-09-21 DIAGNOSIS — C3411 Malignant neoplasm of upper lobe, right bronchus or lung: Secondary | ICD-10-CM | POA: Insufficient documentation

## 2020-09-21 DIAGNOSIS — C773 Secondary and unspecified malignant neoplasm of axilla and upper limb lymph nodes: Secondary | ICD-10-CM | POA: Insufficient documentation

## 2020-09-21 LAB — CMP (CANCER CENTER ONLY)
ALT: 33 U/L (ref 0–44)
AST: 43 U/L — ABNORMAL HIGH (ref 15–41)
Albumin: 3.8 g/dL (ref 3.5–5.0)
Alkaline Phosphatase: 103 U/L (ref 38–126)
Anion gap: 9 (ref 5–15)
BUN: 12 mg/dL (ref 8–23)
CO2: 26 mmol/L (ref 22–32)
Calcium: 9.3 mg/dL (ref 8.9–10.3)
Chloride: 104 mmol/L (ref 98–111)
Creatinine: 0.84 mg/dL (ref 0.44–1.00)
GFR, Estimated: >60 mL/min (ref >60)
Glucose, Bld: 141 mg/dL — ABNORMAL HIGH (ref 70–99)
Potassium: 4.7 mmol/L (ref 3.5–5.1)
Sodium: 139 mmol/L (ref 135–145)
Total Bilirubin: 1.4 mg/dL — ABNORMAL HIGH (ref 0.3–1.2)
Total Protein: 7 g/dL (ref 6.5–8.1)

## 2020-09-21 LAB — CBC WITH DIFFERENTIAL (CANCER CENTER ONLY)
Abs Immature Granulocytes: 0.01 10*3/uL (ref 0.00–0.07)
Basophils Absolute: 0.1 10*3/uL (ref 0.0–0.1)
Basophils Relative: 1 %
Eosinophils Absolute: 0.2 10*3/uL (ref 0.0–0.5)
Eosinophils Relative: 4 %
HCT: 39.1 % (ref 36.0–46.0)
Hemoglobin: 13.6 g/dL (ref 12.0–15.0)
Immature Granulocytes: 0 %
Lymphocytes Relative: 22 %
Lymphs Abs: 1.3 10*3/uL (ref 0.7–4.0)
MCH: 33.5 pg (ref 26.0–34.0)
MCHC: 34.8 g/dL (ref 30.0–36.0)
MCV: 96.3 fL (ref 80.0–100.0)
Monocytes Absolute: 0.5 10*3/uL (ref 0.1–1.0)
Monocytes Relative: 8 %
Neutro Abs: 3.8 10*3/uL (ref 1.7–7.7)
Neutrophils Relative %: 65 %
Platelet Count: 183 10*3/uL (ref 150–400)
RBC: 4.06 MIL/uL (ref 3.87–5.11)
RDW: 12.6 % (ref 11.5–15.5)
WBC Count: 5.8 10*3/uL (ref 4.0–10.5)
nRBC: 0 % (ref 0.0–0.2)

## 2020-09-21 LAB — TSH: TSH: 7.15 u[IU]/mL — ABNORMAL HIGH (ref 0.308–3.960)

## 2020-09-21 MED ORDER — IOHEXOL 300 MG/ML  SOLN
100.0000 mL | Freq: Once | INTRAMUSCULAR | Status: AC | PRN
  Administered 2020-09-21: 100 mL via INTRAVENOUS

## 2020-09-21 MED ORDER — HEPARIN SOD (PORK) LOCK FLUSH 100 UNIT/ML IV SOLN
500.0000 [IU] | Freq: Once | INTRAVENOUS | Status: AC
  Administered 2020-09-21: 500 [IU] via INTRAVENOUS

## 2020-09-21 MED ORDER — HEPARIN SOD (PORK) LOCK FLUSH 100 UNIT/ML IV SOLN
INTRAVENOUS | Status: AC
  Filled 2020-09-21: qty 5

## 2020-09-21 MED ORDER — SODIUM CHLORIDE 0.9% FLUSH
10.0000 mL | INTRAVENOUS | Status: DC | PRN
  Administered 2020-09-21: 10 mL via INTRAVENOUS
  Filled 2020-09-21: qty 10

## 2020-09-21 NOTE — Patient Instructions (Signed)

## 2020-09-25 ENCOUNTER — Other Ambulatory Visit: Payer: Self-pay | Admitting: Medical Oncology

## 2020-09-25 ENCOUNTER — Other Ambulatory Visit: Payer: Self-pay | Admitting: Internal Medicine

## 2020-09-25 ENCOUNTER — Inpatient Hospital Stay: Payer: Medicare Other

## 2020-09-25 ENCOUNTER — Other Ambulatory Visit: Payer: Self-pay

## 2020-09-25 ENCOUNTER — Inpatient Hospital Stay (HOSPITAL_BASED_OUTPATIENT_CLINIC_OR_DEPARTMENT_OTHER): Payer: Medicare Other | Admitting: Internal Medicine

## 2020-09-25 ENCOUNTER — Telehealth: Payer: Self-pay | Admitting: Internal Medicine

## 2020-09-25 ENCOUNTER — Encounter: Payer: Self-pay | Admitting: Internal Medicine

## 2020-09-25 VITALS — BP 142/69 | HR 71 | Temp 98.9°F | Resp 16 | Ht 62.0 in | Wt 146.1 lb

## 2020-09-25 DIAGNOSIS — Z5112 Encounter for antineoplastic immunotherapy: Secondary | ICD-10-CM | POA: Diagnosis present

## 2020-09-25 DIAGNOSIS — C3491 Malignant neoplasm of unspecified part of right bronchus or lung: Secondary | ICD-10-CM

## 2020-09-25 DIAGNOSIS — C773 Secondary and unspecified malignant neoplasm of axilla and upper limb lymph nodes: Secondary | ICD-10-CM | POA: Diagnosis present

## 2020-09-25 DIAGNOSIS — Z79899 Other long term (current) drug therapy: Secondary | ICD-10-CM | POA: Diagnosis not present

## 2020-09-25 DIAGNOSIS — C3411 Malignant neoplasm of upper lobe, right bronchus or lung: Secondary | ICD-10-CM | POA: Diagnosis present

## 2020-09-25 DIAGNOSIS — E032 Hypothyroidism due to medicaments and other exogenous substances: Secondary | ICD-10-CM

## 2020-09-25 MED ORDER — SODIUM CHLORIDE 0.9 % IV SOLN
Freq: Once | INTRAVENOUS | Status: AC
  Filled 2020-09-25: qty 250

## 2020-09-25 MED ORDER — SODIUM CHLORIDE 0.9 % IV SOLN
480.0000 mg | Freq: Once | INTRAVENOUS | Status: AC
  Administered 2020-09-25: 480 mg via INTRAVENOUS
  Filled 2020-09-25: qty 48

## 2020-09-25 MED ORDER — LEVOTHYROXINE SODIUM 50 MCG PO TABS: 50.0000 ug | ORAL_TABLET | Freq: Every day | ORAL | 1 refills | Status: DC

## 2020-09-25 MED ORDER — HEPARIN SOD (PORK) LOCK FLUSH 100 UNIT/ML IV SOLN
500.0000 [IU] | Freq: Once | INTRAVENOUS | Status: AC | PRN
  Administered 2020-09-25: 500 [IU]
  Filled 2020-09-25: qty 5

## 2020-09-25 MED ORDER — SODIUM CHLORIDE 0.9% FLUSH
10.0000 mL | INTRAVENOUS | Status: DC | PRN
  Administered 2020-09-25: 10 mL
  Filled 2020-09-25: qty 10

## 2020-09-25 NOTE — Telephone Encounter (Signed)
Scheduled appointments per 2/22 los. Spoke to patient who is aware of appointments dates and times. Gave patient calendar print out.

## 2020-09-25 NOTE — Progress Notes (Signed)
[place] [phone]   [phone]  OFFICE PROGRESS NOTE  Harris, [name], [name] [address] [place]  [address]  DIAGNOSIS: Metastatic non-small cell lung cancer, adenocarcinoma. This was initially diagnosed as stage IB (T2a, N0, M0) in March of 2014, status post right upper lobectomy with lymph node dissection but the patient has evidence for disease recurrence in the right axilla status post resection.  PRIOR THERAPY:  1) Status post right axillary lymph node biopsy on 05/01/2014 under the care of Dr. Roxan Hockey. 2) Systemic chemotherapy with carboplatin for AUC of 5 and Alimta 500 mg/M2 every 3 weeks. First cycle 05/31/2014. Status post 6 cycles. Last dose was given 09/22/2014. 3) palliative radiotherapy to the subcarinal lymph node under the care of Dr. Sondra Come. 4) Nivolumab 240 mg IV every 2 weeks. First dose 12/23/2016. Status post 6 cycles.  CURRENT THERAPY:  Nivolumab 480 mg IV every 4 weeks. First dose 03/17/2017. Status post 45 cycles.  INTERVAL HISTORY: Nancy Hodges 75 y.o. female returns to the clinic today for follow-up visit.  The patient is feeling fine today with no concerning complaints except for fatigue and arthralgia.  She denied having any current chest pain, shortness of breath, cough or hemoptysis.  She denied having any fever or chills.  She has no nausea, vomiting, diarrhea or constipation.  She has no headache or visual changes.  She denied having any significant weight loss or night sweats.  She continues to tolerate her treatment with nivolumab fairly well.  She is here today for evaluation with repeat CT scan of the chest, abdomen pelvis for restaging of her disease.  MEDICAL HISTORY: Past Medical History:  Diagnosis Date  . Allergic rhinitis   . Anxiety   . Arthritis   . Asthma   . Atrial flutter ([place])   . Cholelithiases 09/26/2015  . Diverticulosis   . Encounter for antineoplastic immunotherapy  12/09/2016  . GERD (gastroesophageal reflux disease)   . H/O hiatal hernia   . History of blood transfusion   . History of chemotherapy   . History of kidney stones   . History of radiation therapy   . Internal hemorrhoid   . Lung cancer ([facility])    a. Stage IB non-small cell carcinoma, s/p R VATS, wedge resection, RU lobectomy 10/2012.  [name] Persistent atrial fibrillation ([facility])    a. anticoagulated with Xarelto  . Pneumonia 06/2016   [facility]  . Presence of permanent cardiac pacemaker   . Pulmonary nodule   . Tachycardia-bradycardia syndrome ([place])    a. 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    ALLERGIES:  is allergic to chlorpheniramine-dm, prednisone, septra [sulfamethoxazole-trimethoprim], vioxx [rofecoxib], molds & smuts, and penicillins.  MEDICATIONS:  Current Outpatient Medications  Medication Sig Dispense Refill  . acetaminophen (TYLENOL) 500 MG tablet Take 500 mg by mouth 3 (three) times daily as needed for moderate pain or fever.     [name] albuterol (PROVENTIL HFA;VENTOLIN HFA) 108 (90 BASE) MCG/ACT inhaler Inhale 2 puffs into the lungs every 6 (six) hours as needed for wheezing or shortness of breath.     [name] azelastine (ASTELIN) 0.1 % nasal spray Place 1 spray into both nostrils 2 (two) times daily. Use in each nostril as directed    . calcium carbonate (TUMS - DOSED IN MG ELEMENTAL CALCIUM) 500 MG chewable tablet Chew 1 tablet by mouth 2 (two) times daily as needed for indigestion or heartburn.     [name] CARTIA  XT 240 MG 24 hr capsule TAKE 1 CAPSULE(240 MG) BY MOUTH DAILY 90 capsule 2  . dimenhyDRINATE (DRAMAMINE) 50 MG tablet Take 12.5 mg by mouth every 8 (eight) hours as needed for dizziness.    [name] ipratropium-albuterol (DUONEB) 0.5-2.5 (3) MG/3ML SOLN Take 3 mLs by nebulization every 6 (six) hours as needed (shortness of breath or wheezing).     [name] lidocaine-prilocaine (EMLA) cream APPLY TOPICALLY TO TO THE AFFECTED AREA AS NEEDED 30 g 4  . loratadine (CLARITIN) 10 MG tablet  Take 10 mg by mouth daily.    [name] OVER THE COUNTER MEDICATION Take 5 mLs by mouth daily as needed (cough). Hyland's kids cough syrup    . Polyvinyl Alcohol-Povidone (REFRESH OP) Place 1 drop into both eyes daily as needed (dry eyes).    [name] tablet TAKE 1 TABLET(20 MG) BY MOUTH DAILY WITH SUPPER 30 tablet 5   No current facility-administered medications for this visit.    SURGICAL HISTORY:  Past Surgical History:  Procedure Laterality Date  . CARDIOVERSION N/A 02/19/2018   Procedure: CARDIOVERSION;  Surgeon: Sanda Klein, MD;  Location: [facility] ENDOSCOPY;  Service: Cardiovascular;  Laterality: N/A;  . CARDIOVERSION N/A 04/30/2018   Procedure: CARDIOVERSION;  Surgeon: Thayer Headings, MD;  Location[facility] END[facility];  Service: Cardiovascular;  Laterality: N/A;  . CATARACT EXTRACTION W/PHACO Right 08/14/2016   Procedure: CATARACT EXTRACTION PHACO AND INTRAOCULAR LENS PLACEMENT RIGHT EYE CDE 10.53;  Surgeon: Tonny Branch, MD;  Location: [facility] ORS;  Service: Ophthalmology;  Laterality: Right;  right  . CATARACT EXTRACTION W/PHACO Left 09/25/2016   Procedure: CATARACT EXTRACTION PHACO AND INTRAOCULAR LENS PLACEMENT (IOC);  Surgeon: Tonny Branch, MD;  Location: [facility] ORS;  Service: Ophthalmology;  Laterality: Left;  left CDE 8.81  . COLONOSCOPY     [facility]: cattered sigmoid diverticula, internal hemorrhoids, sessile polyp in ascending and mid transverse colon (tubular adenoma).   . colonoscopy with polypectomy  2015  . ESOPHAGOGASTRODUODENOSCOPY     [facility]: bile reflux in stomach, mild gastritis, hiatal hernia  . ESOPHAGOGASTRODUODENOSCOPY N/A 01/31/2016   Procedure: ESOPHAGOGASTRODUODENOSCOPY (EGD);  Surgeon: [name], MD;  Location: [facility];  Service: Endoscopy;  Laterality: N/A;  1200  . IR FLUORO GUIDE PORT INSERTION RIGHT  06/30/2017  . IR US GUIDE VASC ACCESS RIGHT  06/30/2017  . LOBECTOMY Right 10/27/2012   Procedure: LOBECTOMY;  Surgeon: [name], MD;  Location: [facility];  Service: Thoracic;  Laterality: Right;   RIGHT UPPER LOBECTOMY & Node Dissection  . LYMPH NODE BIOPSY Right 05/01/2014   Procedure: LYMPH NODE BIOPSY, Right Axillary;  Surgeon: [name], MD;  Location: [facility];  Service: Thoracic;  Laterality: Right;  . PACEMAKER IMPLANT N/A 10/30/2017   SJM Assurity MRI PPM implanted by Dr [name] once leadless pacemaker battery depleted  . PARTIAL HYSTERECTOMY    . PERMANENT PACEMAKER INSERTION N/A 09/14/2012   Nanostim (SJM) leadless pacemaker (LEADLESS II STUDY PATIENT) implanted by Dr [name], no longer functioning, device abandoned.  [name] VIDEO ASSISTED THORACOSCOPY (VATS)/WEDGE RESECTION Right 10/27/2012   Procedure: VIDEO ASSISTED THORACOSCOPY (VATS),RGHT UPPER LOBE LUNG WEDGE RESECTION;  Surgeon: [name], MD;  Location: [facility];  Service: Thoracic;  Laterality: Right;    REVIEW OF SYSTEMS:  Constitutional: positive for fatigue Eyes: negative Ears, nose, mouth, throat, and face: negative Respiratory: negative Cardiovascular: negative Gastrointestinal: negative Genitourinary:negative Integument/breast: negative Hematologic/lymphatic: negative Musculoskeletal:positive for arthralgias Neurological: negative Behavioral/Psych: negative Endocrine: negative Allergic/Immunologic: negative   PHYSICAL EXAMINATION:  General appearance: alert, cooperative and no distress Head: Normocephalic, without obvious abnormality, atraumatic Neck: no adenopathy, no JVD, supple, symmetrical, trachea midline and thyroid not enlarged, symmetric, no tenderness/mass/nodules Lymph nodes: Cervical, supraclavicular, and axillary nodes normal. Resp: clear to auscultation bilaterally Back: symmetric, no curvature. ROM normal. No CVA tenderness. Cardio: regular rate and rhythm, S1, S2 normal, no murmur, click, rub or gallop GI: soft, non-tender; bowel sounds normal; no masses,  no organomegaly Extremities: extremities normal, atraumatic, no cyanosis or  edema Neurologic: Alert and oriented X 3, normal strength and tone. Normal symmetric reflexes. Normal coordination and gait  ECOG PERFORMANCE STATUS: 1 - Symptomatic but completely ambulatory  Blood pressure (!) 142/69, pulse 71, temperature 98.9 F (37.2 C), temperature source Temporal, resp. rate 16, height 5\' 2"  (1.575 m), weight 146 lb 1.6 oz (66.3 kg), SpO2 98 %.  LABORATORY DATA: Lab Results  Component Value Date   WBC 5.8 09/21/2020   HGB 13.6 09/21/2020   HCT 39.1 09/21/2020   MCV 96.3 09/21/2020   PLT 183 09/21/2020      Chemistry      Component Value Date/Time   NA 139 09/21/2020 0840   NA 139 08/05/2017 0902   K 4.7 09/21/2020 0840   K 4.1 08/05/2017 0902   CL 104 09/21/2020 0840   CO2 26 09/21/2020 0840   CO2 28 08/05/2017 0902   BUN 12 09/21/2020 0840   BUN 10.1 08/05/2017 0902   CREATININE 0.84 09/21/2020 0840   CREATININE 0.8 08/05/2017 0902   GLU 134 (H) 02/18/2017 1410      Component Value Date/Time   CALCIUM 9.3 09/21/2020 0840   CALCIUM 9.5 08/05/2017 0902   ALKPHOS 103 09/21/2020 0840   ALKPHOS 111 08/05/2017 0902   AST 43 (H) 09/21/2020 0840   AST 33 08/05/2017 0902   ALT 33 09/21/2020 0840   ALT 34 08/05/2017 0902   BILITOT 1.4 (H) 09/21/2020 0840   BILITOT 1.39 (H) 08/05/2017 0902       RADIOGRAPHIC STUDIES: CT Chest W Contrast  Result Date: 09/21/2020 CLINICAL DATA:  Non-small-cell lung cancer.  Restaging. EXAM: CT CHEST, ABDOMEN, AND PELVIS WITH CONTRAST TECHNIQUE: Multidetector CT imaging of the chest, abdomen and pelvis was performed following the standard protocol during bolus administration of intravenous contrast. CONTRAST:  165mL OMNIPAQUE IOHEXOL 300 MG/ML  SOLN COMPARISON:  05/07/2020 FINDINGS: CT CHEST FINDINGS Cardiovascular: The heart size is normal. No substantial pericardial effusion. Coronary artery calcification is evident. Atherosclerotic calcification is noted in the wall of the thoracic aorta. Left-sided permanent  pacemaker noted. Right Port-A-Cath tip is positioned at the SVC/RA junction. Mediastinum/Nodes: No mediastinal lymphadenopathy. There is no hilar lymphadenopathy. The esophagus has normal imaging features. There is no axillary lymphadenopathy. Lungs/Pleura: Postsurgical/treatment changes noted right parahilar lung with stable volume loss right hemithorax no suspicious pulmonary nodule or mass. Chronic atelectasis or scarring noted posterior right lower lobe (109/6) slightly progressive in the interval. Ground-glass opacity described in the left apex on the prior study is stable on image 25/6 today. No pulmonary edema or pleural effusion Musculoskeletal: No worrisome lytic or sclerotic osseous abnormality. CT ABDOMEN PELVIS FINDINGS Hepatobiliary: Nodular hepatic contour suggests cirrhosis. Recanalization of the paraumbilical vein is compatible with portal venous hypertension. Calcified gallstones again noted. No intrahepatic or extrahepatic biliary dilation. Pancreas: No focal mass lesion. No dilatation of the main duct. No intraparenchymal cyst. No peripancreatic edema. Spleen: 9 mm hypoattenuating splenic lesion measured previously is stable. Adrenals/Urinary Tract: No adrenal nodule or mass. Cortical scarring noted  in both kidneys no suspicious enhancing renal mass. No evidence for hydroureter. The urinary bladder appears normal for the degree of distention. Stomach/Bowel: Tiny hiatal hernia. Paraesophageal varices evident. Stomach is unremarkable. No gastric wall thickening. No evidence of outlet obstruction. Duodenum is normally positioned as is the ligament of Treitz. No small bowel wall thickening. No small bowel dilatation. The terminal ileum is normal. The appendix is normal. No gross colonic mass. No colonic wall thickening. Diverticular changes are noted in the left colon without evidence of diverticulitis. Vascular/Lymphatic: There is abdominal aortic atherosclerosis without aneurysm. Upper normal lymph  nodes in the gastrohepatic and hepatoduodenal ligaments are stable in the interval. No para-aortic lymphadenopathy. No pelvic sidewall lymphadenopathy. Reproductive: The uterus is surgically absent. There is no adnexal mass. Other: No intraperitoneal free fluid. Musculoskeletal: No worrisome lytic or sclerotic osseous abnormality. IMPRESSION: 1. Stable exam. No new or progressive findings to suggest recurrent or metastatic disease in the chest, abdomen, or pelvis. 2. Cirrhotic changes in the liver with recanalization of the paraumbilical vein and paraesophageal varices, features suggesting portal venous hypertension. 3. Borderline lymphadenopathy in the upper abdomen likely reactive given underlying hepatic disease. 4. Cholelithiasis. 5. Left colonic diverticulosis without diverticulitis. 6. Aortic Atherosclerosis (ICD10-I70.0). Electronically Signed   By: Misty Stanley M.D.   On: 09/21/2020 13:26   CT Abdomen Pelvis W Contrast  Result Date: 09/21/2020 CLINICAL DATA:  Non-small-cell lung cancer.  Restaging. EXAM: CT CHEST, ABDOMEN, AND PELVIS WITH CONTRAST TECHNIQUE: Multidetector CT imaging of the chest, abdomen and pelvis was performed following the standard protocol during bolus administration of intravenous contrast. CONTRAST:  164mL OMNIPAQUE IOHEXOL 300 MG/ML  SOLN COMPARISON:  05/07/2020 FINDINGS: CT CHEST FINDINGS Cardiovascular: The heart size is normal. No substantial pericardial effusion. Coronary artery calcification is evident. Atherosclerotic calcification is noted in the wall of the thoracic aorta. Left-sided permanent pacemaker noted. Right Port-A-Cath tip is positioned at the SVC/RA junction. Mediastinum/Nodes: No mediastinal lymphadenopathy. There is no hilar lymphadenopathy. The esophagus has normal imaging features. There is no axillary lymphadenopathy. Lungs/Pleura: Postsurgical/treatment changes noted right parahilar lung with stable volume loss right hemithorax no suspicious pulmonary  nodule or mass. Chronic atelectasis or scarring noted posterior right lower lobe (109/6) slightly progressive in the interval. Ground-glass opacity described in the left apex on the prior study is stable on image 25/6 today. No pulmonary edema or pleural effusion Musculoskeletal: No worrisome lytic or sclerotic osseous abnormality. CT ABDOMEN PELVIS FINDINGS Hepatobiliary: Nodular hepatic contour suggests cirrhosis. Recanalization of the paraumbilical vein is compatible with portal venous hypertension. Calcified gallstones again noted. No intrahepatic or extrahepatic biliary dilation. Pancreas: No focal mass lesion. No dilatation of the main duct. No intraparenchymal cyst. No peripancreatic edema. Spleen: 9 mm hypoattenuating splenic lesion measured previously is stable. Adrenals/Urinary Tract: No adrenal nodule or mass. Cortical scarring noted in both kidneys no suspicious enhancing renal mass. No evidence for hydroureter. The urinary bladder appears normal for the degree of distention. Stomach/Bowel: Tiny hiatal hernia. Paraesophageal varices evident. Stomach is unremarkable. No gastric wall thickening. No evidence of outlet obstruction. Duodenum is normally positioned as is the ligament of Treitz. No small bowel wall thickening. No small bowel dilatation. The terminal ileum is normal. The appendix is normal. No gross colonic mass. No colonic wall thickening. Diverticular changes are noted in the left colon without evidence of diverticulitis. Vascular/Lymphatic: There is abdominal aortic atherosclerosis without aneurysm. Upper normal lymph nodes in the gastrohepatic and hepatoduodenal ligaments are stable in the interval. No para-aortic lymphadenopathy. No pelvic sidewall  lymphadenopathy. Reproductive: The uterus is surgically absent. There is no adnexal mass. Other: No intraperitoneal free fluid. Musculoskeletal: No worrisome lytic or sclerotic osseous abnormality. IMPRESSION: 1. Stable exam. No new or  progressive findings to suggest recurrent or metastatic disease in the chest, abdomen, or pelvis. 2. Cirrhotic changes in the liver with recanalization of the paraumbilical vein and paraesophageal varices, features suggesting portal venous hypertension. 3. Borderline lymphadenopathy in the upper abdomen likely reactive given underlying hepatic disease. 4. Cholelithiasis. 5. Left colonic diverticulosis without diverticulitis. 6. Aortic Atherosclerosis (ICD10-I70.0). Electronically Signed   By: Misty Stanley M.D.   On: 09/21/2020 13:26   ASSESSMENT AND PLAN:  This is a very pleasant 75 years old white female with metastatic non-small cell lung cancer, adenocarcinoma with no actionable mutations status post systemic chemotherapy with carboplatin and Alimta and she is currently undergoing treatment with second line immunotherapy with Nivolumab status post 6 cycles. This was followed by immunotherapy with Nivolumab 480 mg IV every 4 weeks status post 45 cycles. The patient continues to tolerate her immunotherapy fairly well. She had repeat CT scan of the chest, abdomen pelvis performed recently.  I personally and independently reviewed the scans and discussed the results with the patient today. Her scan showed no concerning findings for disease recurrence or metastatic disease. I recommended for her to continue her current treatment with nivolumab and she will proceed with cycle #46 today as planned. For the hypothyroidism, I will start the patient on levothyroxine 50 mcg p.o. daily and will continue to monitor her TSH closely. For the cardiac arrhythmia and hypertension she is followed by cardiology. She will come back for follow-up visit in 4 weeks for evaluation before the next cycle of her treatment. The patient was advised to call immediately if she has any concerning symptoms in the interval. The patient voices understanding of current disease status and treatment options and is in agreement with the  current care plan. All questions were answered. The patient knows to call the clinic with any problems, questions or concerns. We can certainly see the patient much sooner if necessary.  Disclaimer: This note was dictated with voice recognition software. Similar sounding words can inadvertently be transcribed and may not be corrected upon review.

## 2020-09-25 NOTE — Patient Instructions (Signed)
[facility] Discharge Instructions for Patients Receiving Chemotherapy  Today you received the following chemotherapy agents Opdivo  To help prevent nausea and vomiting after your treatment, we encourage you to take your nausea medication as directed   If you develop nausea and vomiting that is not controlled by your nausea medication, call the clinic.   BELOW ARE SYMPTOMS THAT SHOULD BE REPORTED IMMEDIATELY:  *FEVER GREATER THAN 100.5 F  *CHILLS WITH OR WITHOUT FEVER  NAUSEA AND VOMITING THAT IS NOT CONTROLLED WITH YOUR NAUSEA MEDICATION  *UNUSUAL SHORTNESS OF BREATH  *UNUSUAL BRUISING OR BLEEDING  TENDERNESS IN MOUTH AND THROAT WITH OR WITHOUT PRESENCE OF ULCERS  *URINARY PROBLEMS  *BOWEL PROBLEMS  UNUSUAL RASH Items with * indicate a potential emergency and should be followed up as soon as possible.  Feel free to call the clinic should you have any questions or concerns. The clinic phone number is [phone].  Please show the CHEMO ALERT CARD at check-in to the Emergency Department and triage nurse.   

## 2020-09-28 ENCOUNTER — Telehealth: Payer: Self-pay | Admitting: *Deleted

## 2020-09-28 ENCOUNTER — Telehealth: Payer: Self-pay

## 2020-09-28 NOTE — Telephone Encounter (Signed)
I received a call from Ms. Dymond. I called her back.  She had question about her new medication Synthroid.  I read Dr. Worthy Flank note and he states hypothyroid and needs synthroid.  I updated her on this.  I will follow up with Dr. Julien Nordmann to see if there is anything more to tell Ms. Gienger.

## 2020-09-28 NOTE — Telephone Encounter (Signed)
Pt left a message stating she wants to know why Dr. Julien Nordmann sent a new rx for her. She did not indicate the name of the rx.  I have called the pt back and determined it is a rx for Synthroid and indicates in his note she has a Hypothyroid. I advised the pt of this and she expressed understanding of this information and will start take the rx.

## 2020-10-21 ENCOUNTER — Other Ambulatory Visit: Payer: Self-pay | Admitting: Internal Medicine

## 2020-10-22 NOTE — Telephone Encounter (Signed)
Xarelto 20mg  refill request received. Pt is 75 years old, weight-66.3kg, Crea-0.84 on 09/21/20, last seen by Dr. [name] on 03/09/2020, Diagnosis-Afib, CrCl-58.71ml/min; Dose is appropriate based on dosing criteria. Will send in refill to requested pharmacy.

## 2020-10-23 ENCOUNTER — Other Ambulatory Visit: Payer: Self-pay

## 2020-10-23 ENCOUNTER — Encounter: Payer: Self-pay | Admitting: Internal Medicine

## 2020-10-23 ENCOUNTER — Inpatient Hospital Stay: Payer: Medicare Other

## 2020-10-23 ENCOUNTER — Inpatient Hospital Stay: Payer: Medicare Other | Attending: Internal Medicine

## 2020-10-23 ENCOUNTER — Inpatient Hospital Stay (HOSPITAL_BASED_OUTPATIENT_CLINIC_OR_DEPARTMENT_OTHER): Payer: Medicare Other | Admitting: Internal Medicine

## 2020-10-23 VITALS — BP 125/69 | HR 61 | Temp 97.6°F | Resp 16 | Ht 62.0 in | Wt 147.9 lb

## 2020-10-23 DIAGNOSIS — Z79899 Other long term (current) drug therapy: Secondary | ICD-10-CM | POA: Insufficient documentation

## 2020-10-23 DIAGNOSIS — C3491 Malignant neoplasm of unspecified part of right bronchus or lung: Secondary | ICD-10-CM

## 2020-10-23 DIAGNOSIS — C3411 Malignant neoplasm of upper lobe, right bronchus or lung: Secondary | ICD-10-CM | POA: Insufficient documentation

## 2020-10-23 DIAGNOSIS — Z5112 Encounter for antineoplastic immunotherapy: Secondary | ICD-10-CM

## 2020-10-23 DIAGNOSIS — C773 Secondary and unspecified malignant neoplasm of axilla and upper limb lymph nodes: Secondary | ICD-10-CM | POA: Diagnosis present

## 2020-10-23 DIAGNOSIS — E032 Hypothyroidism due to medicaments and other exogenous substances: Secondary | ICD-10-CM

## 2020-10-23 LAB — CBC WITH DIFFERENTIAL (CANCER CENTER ONLY)
Abs Immature Granulocytes: 0.01 10*3/uL (ref 0.00–0.07)
Basophils Absolute: 0 10*3/uL (ref 0.0–0.1)
Basophils Relative: 1 %
Eosinophils Absolute: 0.2 10*3/uL (ref 0.0–0.5)
Eosinophils Relative: 4 %
HCT: 39.7 % (ref 36.0–46.0)
Hemoglobin: 13.5 g/dL (ref 12.0–15.0)
Immature Granulocytes: 0 %
Lymphocytes Relative: 24 %
Lymphs Abs: 1.4 10*3/uL (ref 0.7–4.0)
MCH: 32.8 pg (ref 26.0–34.0)
MCHC: 34 g/dL (ref 30.0–36.0)
MCV: 96.6 fL (ref 80.0–100.0)
Monocytes Absolute: 0.5 10*3/uL (ref 0.1–1.0)
Monocytes Relative: 9 %
Neutro Abs: 3.7 10*3/uL (ref 1.7–7.7)
Neutrophils Relative %: 62 %
Platelet Count: 177 10*3/uL (ref 150–400)
RBC: 4.11 MIL/uL (ref 3.87–5.11)
RDW: 13.1 % (ref 11.5–15.5)
WBC Count: 5.9 10*3/uL (ref 4.0–10.5)
nRBC: 0 % (ref 0.0–0.2)

## 2020-10-23 LAB — CMP (CANCER CENTER ONLY)
ALT: 36 U/L (ref 0–44)
AST: 41 U/L (ref 15–41)
Albumin: 3.9 g/dL (ref 3.5–5.0)
Alkaline Phosphatase: 96 U/L (ref 38–126)
Anion gap: 10 (ref 5–15)
BUN: 9 mg/dL (ref 8–23)
CO2: 27 mmol/L (ref 22–32)
Calcium: 9.5 mg/dL (ref 8.9–10.3)
Chloride: 104 mmol/L (ref 98–111)
Creatinine: 0.85 mg/dL (ref 0.44–1.00)
GFR, Estimated: >60 mL/min (ref >60)
Glucose, Bld: 151 mg/dL — ABNORMAL HIGH (ref 70–99)
Potassium: 4.4 mmol/L (ref 3.5–5.1)
Sodium: 141 mmol/L (ref 135–145)
Total Bilirubin: 1.3 mg/dL — ABNORMAL HIGH (ref 0.3–1.2)
Total Protein: 7 g/dL (ref 6.5–8.1)

## 2020-10-23 LAB — TSH: TSH: 3.233 u[IU]/mL (ref 0.308–3.960)

## 2020-10-23 MED ORDER — SODIUM CHLORIDE 0.9 % IV SOLN
Freq: Once | INTRAVENOUS | Status: AC
  Filled 2020-10-23: qty 250

## 2020-10-23 MED ORDER — SODIUM CHLORIDE 0.9 % IV SOLN
480.0000 mg | Freq: Once | INTRAVENOUS | Status: AC
  Administered 2020-10-23: 480 mg via INTRAVENOUS
  Filled 2020-10-23: qty 48

## 2020-10-23 MED ORDER — HEPARIN SOD (PORK) LOCK FLUSH 100 UNIT/ML IV SOLN
500.0000 [IU] | Freq: Once | INTRAVENOUS | Status: AC | PRN
  Administered 2020-10-23: 500 [IU]
  Filled 2020-10-23: qty 5

## 2020-10-23 MED ORDER — SODIUM CHLORIDE 0.9% FLUSH
10.0000 mL | INTRAVENOUS | Status: DC | PRN
  Administered 2020-10-23: 10 mL
  Filled 2020-10-23: qty 10

## 2020-10-23 NOTE — Patient Instructions (Signed)
[facility] Discharge Instructions for Patients Receiving Chemotherapy  Today you received the following chemotherapy agents Nivolumab (OPDIVO).  To help prevent nausea and vomiting after your treatment, we encourage you to take your nausea medication as prescribed.   If you develop nausea and vomiting that is not controlled by your nausea medication, call the clinic.   BELOW ARE SYMPTOMS THAT SHOULD BE REPORTED IMMEDIATELY:  *FEVER GREATER THAN 100.5 F  *CHILLS WITH OR WITHOUT FEVER  NAUSEA AND VOMITING THAT IS NOT CONTROLLED WITH YOUR NAUSEA MEDICATION  *UNUSUAL SHORTNESS OF BREATH  *UNUSUAL BRUISING OR BLEEDING  TENDERNESS IN MOUTH AND THROAT WITH OR WITHOUT PRESENCE OF ULCERS  *URINARY PROBLEMS  *BOWEL PROBLEMS  UNUSUAL RASH Items with * indicate a potential emergency and should be followed up as soon as possible.  Feel free to call the clinic should you have any questions or concerns. The clinic phone number is [phone].  Please show the CHEMO ALERT CARD at check-in to the Emergency Department and triage nurse.   

## 2020-10-23 NOTE — Progress Notes (Signed)
[place] Telephone:(336) [phone]   Fax:(336) [phone]  OFFICE PROGRESS NOTE  Harris, [name], [name] [address] [place] [address]  DIAGNOSIS: Metastatic non-small cell lung cancer, adenocarcinoma. This was initially diagnosed as stage IB (T2a, N0, M0) in March of 2014, status post right upper lobectomy with lymph node dissection but the patient has evidence for disease recurrence in the right axilla status post resection.  PRIOR THERAPY:  1) Status post right axillary lymph node biopsy on 05/01/2014 under the care of Dr. Roxan Hockey. 2) Systemic chemotherapy with carboplatin for AUC of 5 and Alimta 500 mg/M2 every 3 weeks. First cycle 05/31/2014. Status post 6 cycles. Last dose was given 09/22/2014. 3) palliative radiotherapy to the subcarinal lymph node under the care of Dr. Sondra Come. 4) Nivolumab 240 mg IV every 2 weeks. First dose 12/23/2016. Status post 6 cycles.  CURRENT THERAPY:  Nivolumab 480 mg IV every 4 weeks. First dose 03/17/2017. Status post 46 cycles.  INTERVAL HISTORY: Nancy Hodges 75 y.o. female returns to the clinic today for follow-up visit.  The patient is feeling fine today with no concerning complaints.  She denied having any current chest pain, shortness of breath, cough or hemoptysis.  She denied having any fever or chills.  She has no nausea, vomiting, diarrhea or constipation.  She has no headache or visual changes.  The patient denied having any weight loss or night sweats.  She continues to tolerate her treatment with nivolumab fairly well.  She is here today for evaluation before starting cycle #47.  MEDICAL HISTORY: Past Medical History:  Diagnosis Date  . Allergic rhinitis   . Anxiety   . Arthritis   . Asthma   . Atrial flutter ([place])   . Cholelithiases 09/26/2015  . Diverticulosis   . Encounter for antineoplastic immunotherapy 12/09/2016  . GERD (gastroesophageal reflux disease)   . H/O hiatal hernia   . History of  blood transfusion   . History of chemotherapy   . History of kidney stones   . History of radiation therapy   . Internal hemorrhoid   . Lung cancer ([place])    a. Stage IB non-small cell carcinoma, s/p R VATS, wedge resection, RU lobectomy 10/2012.  [name] Persistent atrial fibrillation (H[facility])    a. anticoagulated with Xarelto  . Pneumonia 06/2016   [facility]  . Presence of permanent cardiac pacemaker   . Pulmonary nodule   . Tachycardia-bradycardia syndrome ([place])    a. 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    ALLERGIES:  is allergic to chlorpheniramine-dm, prednisone, septra [sulfamethoxazole-trimethoprim], vioxx [rofecoxib], molds & smuts, and penicillins.  MEDICATIONS:  Current Outpatient Medications  Medication Sig Dispense Refill  . acetaminophen (TYLENOL) 500 MG tablet Take 500 mg by mouth 3 (three) times daily as needed for moderate pain or fever.     . Acetaminophen-DM 1000-30 MG/30ML LIQD     . albuterol (PROVENTIL HFA;VENTOLIN HFA) 108 (90 BASE) MCG/ACT inhaler Inhale 2 puffs into the lungs every 6 (six) hours as needed for wheezing or shortness of breath.     [name] azelastine (ASTELIN) 0.1 % nasal spray Place 1 spray into both nostrils 2 (two) times daily. Use in each nostril as directed    . calcium carbonate (TUMS - DOSED IN MG ELEMENTAL CALCIUM) 500 MG chewable tablet Chew 1 tablet by mouth 2 (two) times daily as needed for indigestion or heartburn.     [name] CARTIA XT 240 MG 24 hr capsule  TAKE 1 CAPSULE(240 MG) BY MOUTH DAILY 90 capsule 2  . dimenhyDRINATE (DRAMAMINE) 50 MG tablet Take 12.5 mg by mouth every 8 (eight) hours as needed for dizziness.    [name] ipratropium-albuterol (DUONEB) 0.5-2.5 (3) MG/3ML SOLN Take 3 mLs by nebulization every 6 (six) hours as needed (shortness of breath or wheezing).     [name] levothyroxine (SYNTHROID) 50 MCG tablet TAKE 1 TABLET(50 MCG) BY MOUTH DAILY BEFORE AND BREAKFAST 90 tablet 0  . lidocaine-prilocaine (EMLA) cream APPLY TOPICALLY TO TO THE  AFFECTED AREA AS NEEDED 30 g 4  . loratadine (CLARITIN) 10 MG tablet Take 10 mg by mouth daily.    [name] OVER THE COUNTER MEDICATION Take 5 mLs by mouth daily as needed (cough). Hyland's kids cough syrup    . Polyvinyl Alcohol-Povidone (REFRESH OP) Place 1 drop into both eyes daily as needed (dry eyes).    [name] tablet TAKE 1 TABLET(20 MG) BY MOUTH DAILY WITH SUPPER 30 tablet 5   No current facility-administered medications for this visit.    SURGICAL HISTORY:  Past Surgical History:  Procedure Laterality Date  . CARDIOVERSION N/A 02/19/2018   Procedure: CARDIOVERSION;  Surgeon: Sanda Klein, MD;  Location: [facility] ENDOSCOPY;  Service: Cardiovascular;  Laterality: N/A;  . CARDIOVERSION N/A 04/30/2018   Procedure: CARDIOVERSION;  Surgeon: Thayer Headings, MD;  Location: [facility] ENDOSCOPY;  Service: Cardiovascular;  Laterality: N/A;  . CATARACT EXTRACTION W/PHACO Right 08/14/2016   Procedure: CATARACT EXTRACTION PHACO AND INTRAOCULAR LENS PLACEMENT RIGHT EYE CDE 10.53;  Surgeon: Tonny Branch, MD;  [facility];  Service: Ophthalmology;  Laterality: Right;  right  . CATARACT EXTRACTION W/PHACO Left 09/25/2016   Procedure: CATARACT EXTRACTION PHACO AND INTRAOCULAR LENS PLACEMENT (IOC);  Surgeon: Tonny Branch, MD;  Location: [facility];  Service: Ophthalmology;  Laterality: Left;  left CDE 8.81  . COLONOSCOPY     [facility]: cattered sigmoid diverticula, internal hemorrhoids, sessile polyp in ascending and mid transverse colon (tubular adenoma).   . colonoscopy with polypectomy  2015  . ESOPHAGOGASTRODUODENOSCOPY     [facility]: bile reflux in stomach, mild gastritis, hiatal hernia  . ESOPHAGOGASTRODUODENOSCOPY N/A 01/31/2016   Procedure: ESOPHAGOGASTRODUODENOSCOPY (EGD);  Surgeon: [name], MD;  [facility];  Service: Endoscopy;  Laterality: N/A;  1200  . IR FLUORO GUIDE PORT INSERTION RIGHT  06/30/2017  . IR US GUIDE VASC ACCESS RIGHT  06/30/2017  . LOBECTOMY Right 10/27/2012    Procedure: LOBECTOMY;  Surgeon: [name], MD;  Location: [facility];  Service: Thoracic;  Laterality: Right;   RIGHT UPPER LOBECTOMY & Node Dissection  . LYMPH NODE BIOPSY Right 05/01/2014   Procedure: LYMPH NODE BIOPSY, Right Axillary;  Surgeon: [name], MD;  Location: [facility];  Service: Thoracic;  Laterality: Right;  . PACEMAKER IMPLANT N/A 10/30/2017   SJM Assurity MRI PPM implanted by Dr [name] once leadless pacemaker battery depleted  . PARTIAL HYSTERECTOMY    . PERMANENT PACEMAKER INSERTION N/A 09/14/2012   Nanostim (SJM) leadless pacemaker (LEADLESS II STUDY PATIENT) implanted by Dr [name], no longer functioning, device abandoned.  [name] VIDEO ASSISTED THORACOSCOPY (VATS)/WEDGE RESECTION Right 10/27/2012   Procedure: VIDEO ASSISTED THORACOSCOPY (VATS),RGHT UPPER LOBE LUNG WEDGE RESECTION;  Surgeon: [name], MD;  Location: [facility];  Service: Thoracic;  Laterality: Right;    REVIEW OF SYSTEMS:  A comprehensive review of systems was negative.   PHYSICAL EXAMINATION: General appearance: alert, cooperative and no distress Head: Normocephalic, without obvious abnormality, atraumatic Neck:  no adenopathy, no JVD, supple, symmetrical, trachea midline and thyroid not enlarged, symmetric, no tenderness/mass/nodules Lymph nodes: Cervical, supraclavicular, and axillary nodes normal. Resp: clear to auscultation bilaterally Back: symmetric, no curvature. ROM normal. No CVA tenderness. Cardio: regular rate and rhythm, S1, S2 normal, no murmur, click, rub or gallop GI: soft, non-tender; bowel sounds normal; no masses,  no organomegaly Extremities: extremities normal, atraumatic, no cyanosis or edema  ECOG PERFORMANCE STATUS: 1 - Symptomatic but completely ambulatory  Blood pressure 125/69, pulse 61, temperature 97.6 F (36.4 C), temperature source Tympanic, resp. rate 16, height 5\' 2"  (1.575 m), weight 147 lb 14.4 oz (67.1 kg), SpO2 98 %.  LABORATORY DATA: Lab Results   Component Value Date   WBC 5.9 10/23/2020   HGB 13.5 10/23/2020   HCT 39.7 10/23/2020   MCV 96.6 10/23/2020   PLT 177 10/23/2020      Chemistry      Component Value Date/Time   NA 139 09/21/2020 0840   NA 139 08/05/2017 0902   K 4.7 09/21/2020 0840   K 4.1 08/05/2017 0902   CL 104 09/21/2020 0840   CO2 26 09/21/2020 0840   CO2 28 08/05/2017 0902   BUN 12 09/21/2020 0840   BUN 10.1 08/05/2017 0902   CREATININE 0.84 09/21/2020 0840   CREATININE 0.8 08/05/2017 0902   GLU 134 (H) 02/18/2017 1410      Component Value Date/Time   CALCIUM 9.3 09/21/2020 0840   CALCIUM 9.5 08/05/2017 0902   ALKPHOS 103 09/21/2020 0840   ALKPHOS 111 08/05/2017 0902   AST 43 (H) 09/21/2020 0840   AST 33 08/05/2017 0902   ALT 33 09/21/2020 0840   ALT 34 08/05/2017 0902   BILITOT 1.4 (H) 09/21/2020 0840   BILITOT 1.39 (H) 08/05/2017 0902       RADIOGRAPHIC STUDIES: No results found. ASSESSMENT AND PLAN:  This is a very pleasant 75 years old white female with metastatic non-small cell lung cancer, adenocarcinoma with no actionable mutations status post systemic chemotherapy with carboplatin and Alimta and she is currently undergoing treatment with second line immunotherapy with Nivolumab status post 6 cycles. This was followed by immunotherapy with Nivolumab 480 mg IV every 4 weeks status post 46 cycles. The patient continues to tolerate this treatment well with no concerning adverse effects. I recommended for her to proceed with cycle #47 today as planned. For the hypothyroidism, I will start the patient on levothyroxine 50 mcg p.o. daily and will continue to monitor her TSH closely. For the cardiac arrhythmia and hypertension she is followed by cardiology. She was advised to call immediately if she has any concerning symptoms in the interval. The patient voices understanding of current disease status and treatment options and is in agreement with the current care plan. All questions were  answered. The patient knows to call the clinic with any problems, questions or concerns. We can certainly see the patient much sooner if necessary.  Disclaimer: This note was dictated with voice recognition software. Similar sounding words can inadvertently be transcribed and may not be corrected upon review.

## 2020-10-31 ENCOUNTER — Ambulatory Visit (INDEPENDENT_AMBULATORY_CARE_PROVIDER_SITE_OTHER): Payer: Medicare Other

## 2020-10-31 DIAGNOSIS — I495 Sick sinus syndrome: Secondary | ICD-10-CM | POA: Diagnosis not present

## 2020-10-31 LAB — CUP PACEART REMOTE DEVICE CHECK
Battery Remaining Longevity: 131 mo
Battery Remaining Percentage: 95.5 %
Battery Voltage: 3.01 V
Brady Statistic RV Percent Paced: 32 %
Date Time Interrogation Session: 20220330020014
Implantable Lead Implant Date: 20190329
Implantable Lead Implant Date: 20190329
Implantable Lead Location: 753859
Implantable Lead Location: 753860
Implantable Pulse Generator Implant Date: 20190329
Lead Channel Impedance Value: 550 Ohm
Lead Channel Pacing Threshold Amplitude: 0.75 V
Lead Channel Pacing Threshold Pulse Width: 0.5 ms
Lead Channel Sensing Intrinsic Amplitude: 12 mV
Lead Channel Setting Pacing Amplitude: 2.5 V
Lead Channel Setting Pacing Pulse Width: 0.5 ms
Lead Channel Setting Sensing Sensitivity: 2 mV
Pulse Gen Model: 2272
Pulse Gen Serial Number: 9003317

## 2020-11-13 NOTE — Progress Notes (Signed)
Remote pacemaker transmission.   

## 2020-11-20 ENCOUNTER — Inpatient Hospital Stay: Payer: Medicare Other

## 2020-11-20 ENCOUNTER — Inpatient Hospital Stay (HOSPITAL_BASED_OUTPATIENT_CLINIC_OR_DEPARTMENT_OTHER): Payer: Medicare Other | Admitting: Internal Medicine

## 2020-11-20 ENCOUNTER — Encounter: Payer: Self-pay | Admitting: Internal Medicine

## 2020-11-20 ENCOUNTER — Other Ambulatory Visit: Payer: Self-pay

## 2020-11-20 ENCOUNTER — Inpatient Hospital Stay: Payer: Medicare Other | Attending: Internal Medicine

## 2020-11-20 VITALS — BP 130/56 | HR 60 | Temp 97.7°F | Resp 18 | Ht 62.0 in | Wt 146.4 lb

## 2020-11-20 DIAGNOSIS — C3491 Malignant neoplasm of unspecified part of right bronchus or lung: Secondary | ICD-10-CM

## 2020-11-20 DIAGNOSIS — C3411 Malignant neoplasm of upper lobe, right bronchus or lung: Secondary | ICD-10-CM | POA: Insufficient documentation

## 2020-11-20 DIAGNOSIS — Z5112 Encounter for antineoplastic immunotherapy: Secondary | ICD-10-CM | POA: Insufficient documentation

## 2020-11-20 DIAGNOSIS — Z79899 Other long term (current) drug therapy: Secondary | ICD-10-CM | POA: Insufficient documentation

## 2020-11-20 DIAGNOSIS — C773 Secondary and unspecified malignant neoplasm of axilla and upper limb lymph nodes: Secondary | ICD-10-CM | POA: Diagnosis present

## 2020-11-20 DIAGNOSIS — C3490 Malignant neoplasm of unspecified part of unspecified bronchus or lung: Secondary | ICD-10-CM

## 2020-11-20 DIAGNOSIS — R5383 Other fatigue: Secondary | ICD-10-CM

## 2020-11-20 LAB — CBC WITH DIFFERENTIAL (CANCER CENTER ONLY)
Abs Immature Granulocytes: 0.01 10*3/uL (ref 0.00–0.07)
Basophils Absolute: 0 10*3/uL (ref 0.0–0.1)
Basophils Relative: 1 %
Eosinophils Absolute: 0.2 10*3/uL (ref 0.0–0.5)
Eosinophils Relative: 3 %
HCT: 40.5 % (ref 36.0–46.0)
Hemoglobin: 14.1 g/dL (ref 12.0–15.0)
Immature Granulocytes: 0 %
Lymphocytes Relative: 22 %
Lymphs Abs: 1.3 10*3/uL (ref 0.7–4.0)
MCH: 33.4 pg (ref 26.0–34.0)
MCHC: 34.8 g/dL (ref 30.0–36.0)
MCV: 96 fL (ref 80.0–100.0)
Monocytes Absolute: 0.4 10*3/uL (ref 0.1–1.0)
Monocytes Relative: 7 %
Neutro Abs: 3.9 10*3/uL (ref 1.7–7.7)
Neutrophils Relative %: 67 %
Platelet Count: 187 10*3/uL (ref 150–400)
RBC: 4.22 MIL/uL (ref 3.87–5.11)
RDW: 13.2 % (ref 11.5–15.5)
WBC Count: 5.8 10*3/uL (ref 4.0–10.5)
nRBC: 0 % (ref 0.0–0.2)

## 2020-11-20 LAB — CMP (CANCER CENTER ONLY)
ALT: 31 U/L (ref 0–44)
AST: 41 U/L (ref 15–41)
Albumin: 4 g/dL (ref 3.5–5.0)
Alkaline Phosphatase: 101 U/L (ref 38–126)
Anion gap: 10 (ref 5–15)
BUN: 8 mg/dL (ref 8–23)
CO2: 27 mmol/L (ref 22–32)
Calcium: 9.6 mg/dL (ref 8.9–10.3)
Chloride: 103 mmol/L (ref 98–111)
Creatinine: 0.78 mg/dL (ref 0.44–1.00)
GFR, Estimated: >60 mL/min (ref >60)
Glucose, Bld: 157 mg/dL — ABNORMAL HIGH (ref 70–99)
Potassium: 4.4 mmol/L (ref 3.5–5.1)
Sodium: 140 mmol/L (ref 135–145)
Total Bilirubin: 1.2 mg/dL (ref 0.3–1.2)
Total Protein: 7.4 g/dL (ref 6.5–8.1)

## 2020-11-20 LAB — TSH: TSH: 3.688 u[IU]/mL (ref 0.308–3.960)

## 2020-11-20 MED ORDER — SODIUM CHLORIDE 0.9 % IV SOLN
480.0000 mg | Freq: Once | INTRAVENOUS | Status: AC
  Administered 2020-11-20: 480 mg via INTRAVENOUS
  Filled 2020-11-20: qty 48

## 2020-11-20 MED ORDER — SODIUM CHLORIDE 0.9 % IV SOLN
Freq: Once | INTRAVENOUS | Status: AC
  Filled 2020-11-20: qty 250

## 2020-11-20 MED ORDER — SODIUM CHLORIDE 0.9% FLUSH
10.0000 mL | INTRAVENOUS | Status: DC | PRN
  Administered 2020-11-20: 10 mL
  Filled 2020-11-20: qty 10

## 2020-11-20 MED ORDER — HEPARIN SOD (PORK) LOCK FLUSH 100 UNIT/ML IV SOLN
500.0000 [IU] | Freq: Once | INTRAVENOUS | Status: AC | PRN
  Administered 2020-11-20: 500 [IU]
  Filled 2020-11-20: qty 5

## 2020-11-20 NOTE — Patient Instructions (Signed)
[facility] Discharge Instructions for Patients Receiving Chemotherapy  Today you received the following chemotherapy agents Nivolumab (OPDIVO).  To help prevent nausea and vomiting after your treatment, we encourage you to take your nausea medication as prescribed.   If you develop nausea and vomiting that is not controlled by your nausea medication, call the clinic.   BELOW ARE SYMPTOMS THAT SHOULD BE REPORTED IMMEDIATELY:  *FEVER GREATER THAN 100.5 F  *CHILLS WITH OR WITHOUT FEVER  NAUSEA AND VOMITING THAT IS NOT CONTROLLED WITH YOUR NAUSEA MEDICATION  *UNUSUAL SHORTNESS OF BREATH  *UNUSUAL BRUISING OR BLEEDING  TENDERNESS IN MOUTH AND THROAT WITH OR WITHOUT PRESENCE OF ULCERS  *URINARY PROBLEMS  *BOWEL PROBLEMS  UNUSUAL RASH Items with * indicate a potential emergency and should be followed up as soon as possible.  Feel free to call the clinic should you have any questions or concerns. The clinic phone number is [phone].  Please show the CHEMO ALERT CARD at check-in to the Emergency Department and triage nurse.   

## 2020-11-20 NOTE — Patient Instructions (Signed)

## 2020-11-20 NOTE — Progress Notes (Signed)
[place] Telephone:([phone]   Fax:(336) [phone]  OFFICE PROGRESS NOTE  Harris, [name], [name] [address] [place] [address]  DIAGNOSIS: Metastatic non-small cell lung cancer, adenocarcinoma. This was initially diagnosed as stage IB (T2a, N0, M0) in March of 2014, status post right upper lobectomy with lymph node dissection but the patient has evidence for disease recurrence in the right axilla status post resection.  PRIOR THERAPY:  1) Status post right axillary lymph node biopsy on 05/01/2014 under the care of Dr. Roxan Hockey. 2) Systemic chemotherapy with carboplatin for AUC of 5 and Alimta 500 mg/M2 every 3 weeks. First cycle 05/31/2014. Status post 6 cycles. Last dose was given 09/22/2014. 3) palliative radiotherapy to the subcarinal lymph node under the care of Dr. Sondra Come. 4) Nivolumab 240 mg IV every 2 weeks. First dose 12/23/2016. Status post 6 cycles.  CURRENT THERAPY:  Nivolumab 480 mg IV every 4 weeks. First dose 03/17/2017. Status post 47 cycles.  INTERVAL HISTORY: Nancy Hodges 75 y.o. female returns to the clinic today for follow-up visit.  The patient is feeling fine today with no concerning complaints except for abdominal bloating.  She denied having any current chest pain, shortness of breath, cough or hemoptysis.  She denied having any fever or chills.  She has no nausea, vomiting, diarrhea or constipation.  She denied having any headache or visual changes.  She is here today for evaluation before starting cycle #48 of her treatment.   MEDICAL HISTORY: Past Medical History:  Diagnosis Date  . Allergic rhinitis   . Anxiety   . Arthritis   . Asthma   . Atrial flutter ([place])   . Cholelithiases 09/26/2015  . Diverticulosis   . Encounter for antineoplastic immunotherapy 12/09/2016  . GERD (gastroesophageal reflux disease)   . H/O hiatal hernia   . History of blood transfusion   . History of chemotherapy   . History of kidney  stones   . History of radiation therapy   . Internal hemorrhoid   . Lung cancer ([place])    a. Stage IB non-small cell carcinoma, s/p R VATS, wedge resection, RU lobectomy 10/2012.  [name] Persistent atrial fibrillation (H[facility])    a. anticoagulated with Xarelto  . Pneumonia 06/2016   [facility]  . Presence of permanent cardiac pacemaker   . Pulmonary nodule   . Tachycardia-bradycardia syndrome ([place])    a. 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    ALLERGIES:  is allergic to chlorpheniramine-dm, prednisone, septra [sulfamethoxazole-trimethoprim], vioxx [rofecoxib], molds & smuts, and penicillins.  MEDICATIONS:  Current Outpatient Medications  Medication Sig Dispense Refill  . acetaminophen (TYLENOL) 500 MG tablet Take 500 mg by mouth 3 (three) times daily as needed for moderate pain or fever.     . Acetaminophen-DM 1000-30 MG/30ML LIQD     . albuterol (PROVENTIL HFA;VENTOLIN HFA) 108 (90 BASE) MCG/ACT inhaler Inhale 2 puffs into the lungs every 6 (six) hours as needed for wheezing or shortness of breath.     [name] azelastine (ASTELIN) 0.1 % nasal spray Place 1 spray into both nostrils 2 (two) times daily. Use in each nostril as directed    . calcium carbonate (TUMS - DOSED IN MG ELEMENTAL CALCIUM) 500 MG chewable tablet Chew 1 tablet by mouth 2 (two) times daily as needed for indigestion or heartburn.     [name] CARTIA XT 240 MG 24 hr capsule TAKE 1 CAPSULE(240 MG) BY MOUTH DAILY 90 capsule 2  . dimenhyDRINATE (  DRAMAMINE) 50 MG tablet Take 12.5 mg by mouth every 8 (eight) hours as needed for dizziness.    [name] ipratropium-albuterol (DUONEB) 0.5-2.5 (3) MG/3ML SOLN Take 3 mLs by nebulization every 6 (six) hours as needed (shortness of breath or wheezing).     [name] levothyroxine (SYNTHROID) 50 MCG tablet TAKE 1 TABLET(50 MCG) BY MOUTH DAILY BEFORE AND BREAKFAST 90 tablet 0  . lidocaine-prilocaine (EMLA) cream APPLY TOPICALLY TO TO THE AFFECTED AREA AS NEEDED 30 g 4  . loratadine (CLARITIN) 10 MG tablet Take  10 mg by mouth daily.    [name] OVER THE COUNTER MEDICATION Take 5 mLs by mouth daily as needed (cough). Hyland's kids cough syrup    . Polyvinyl Alcohol-Povidone (REFRESH OP) Place 1 drop into both eyes daily as needed (dry eyes).    [name] tablet TAKE 1 TABLET(20 MG) BY MOUTH DAILY WITH SUPPER 30 tablet 5   No current facility-administered medications for this visit.    SURGICAL HISTORY:  Past Surgical History:  Procedure Laterality Date  . CARDIOVERSION N/A 02/19/2018   Procedure: CARDIOVERSION;  Surgeon: Sanda Klein, MD;  Location: [facility] ENDOSCOPY;  Service: Cardiovascular;  Laterality: N/A;  . CARDIOVERSION N/A 04/30/2018   Procedure: CARDIOVERSION;  Surgeon: Thayer Headings, MD;  Location: [facility]COPY;  Service: Cardiovascular;  Laterality: N/A;  . CATARACT EXTRACTION W/PHACO Right 08/14/2016   Procedure: CATARACT EXTRACTION PHACO AND INTRAOCULAR LENS PLACEMENT RIGHT EYE CDE 10.53;  Surgeon: Tonny Branch, MD;  [facility];  Service: Ophthalmology;  Laterality: Right;  right  . CATARACT EXTRACTION W/PHACO Left 09/25/2016   Procedure: CATARACT EXTRACTION PHACO AND INTRAOCULAR LENS PLACEMENT (IOC);  Surgeon: Tonny Branch, MD;  [facility];  Service: Ophthalmology;  Laterality: Left;  left CDE 8.81  . COLONOSCOPY     [facility]: cattered sigmoid diverticula, internal hemorrhoids, sessile polyp in ascending and mid transverse colon (tubular adenoma).   . colonoscopy with polypectomy  2015  . ESOPHAGOGASTRODUODENOSCOPY     [facility]: bile reflux in stomach, mild gastritis, hiatal hernia  . ESOPHAGOGASTRODUODENOSCOPY N/A 01/31/2016   Procedure: ESOPHAGOGASTRODUODENOSCOPY (EGD);  Surgeon: [name], MD;  [facility];  Service: Endoscopy;  Laterality: N/A;  1200  . IR FLUORO GUIDE PORT INSERTION RIGHT  06/30/2017  . IR US GUIDE VASC ACCESS RIGHT  06/30/2017  . LOBECTOMY Right 10/27/2012   Procedure: LOBECTOMY;  Surgeon: [name], MD;  Location: [facility];   Service: Thoracic;  Laterality: Right;   RIGHT UPPER LOBECTOMY & Node Dissection  . LYMPH NODE BIOPSY Right 05/01/2014   Procedure: LYMPH NODE BIOPSY, Right Axillary;  Surgeon: [name], MD;  Location: [facility];  Service: Thoracic;  Laterality: Right;  . PACEMAKER IMPLANT N/A 10/30/2017   SJM Assurity MRI PPM implanted by Dr [name] once leadless pacemaker battery depleted  . PARTIAL HYSTERECTOMY    . PERMANENT PACEMAKER INSERTION N/A 09/14/2012   Nanostim (SJM) leadless pacemaker (LEADLESS II STUDY PATIENT) implanted by Dr [name], no longer functioning, device abandoned.  [name] VIDEO ASSISTED THORACOSCOPY (VATS)/WEDGE RESECTION Right 10/27/2012   Procedure: VIDEO ASSISTED THORACOSCOPY (VATS),RGHT UPPER LOBE LUNG WEDGE RESECTION;  Surgeon: [name], MD;  Location: [facility];  Service: Thoracic;  Laterality: Right;    REVIEW OF SYSTEMS:  A comprehensive review of systems was negative except for: Gastrointestinal: positive for Abdominal bloating   PHYSICAL EXAMINATION: General appearance: alert, cooperative and no distress Head: Normocephalic, without obvious abnormality, atraumatic Neck: no adenopathy, no JVD, supple, symmetrical,  trachea midline and thyroid not enlarged, symmetric, no tenderness/mass/nodules Lymph nodes: Cervical, supraclavicular, and axillary nodes normal. Resp: clear to auscultation bilaterally Back: symmetric, no curvature. ROM normal. No CVA tenderness. Cardio: regular rate and rhythm, S1, S2 normal, no murmur, click, rub or gallop GI: soft, non-tender; bowel sounds normal; no masses,  no organomegaly Extremities: extremities normal, atraumatic, no cyanosis or edema  ECOG PERFORMANCE STATUS: 1 - Symptomatic but completely ambulatory  Blood pressure (!) 130/56, pulse 60, temperature 97.7 F (36.5 C), temperature source Tympanic, resp. rate 18, height 5\' 2"  (1.575 m), weight 146 lb 6.4 oz (66.4 kg), SpO2 98 %.  LABORATORY DATA: Lab Results  Component Value  Date   WBC 5.8 11/20/2020   HGB 14.1 11/20/2020   HCT 40.5 11/20/2020   MCV 96.0 11/20/2020   PLT 187 11/20/2020      Chemistry      Component Value Date/Time   NA 140 11/20/2020 0855   NA 139 08/05/2017 0902   K 4.4 11/20/2020 0855   K 4.1 08/05/2017 0902   CL 103 11/20/2020 0855   CO2 27 11/20/2020 0855   CO2 28 08/05/2017 0902   BUN 8 11/20/2020 0855   BUN 10.1 08/05/2017 0902   CREATININE 0.78 11/20/2020 0855   CREATININE 0.8 08/05/2017 0902   GLU 134 (H) 02/18/2017 1410      Component Value Date/Time   CALCIUM 9.6 11/20/2020 0855   CALCIUM 9.5 08/05/2017 0902   ALKPHOS 101 11/20/2020 0855   ALKPHOS 111 08/05/2017 0902   AST 41 11/20/2020 0855   AST 33 08/05/2017 0902   ALT 31 11/20/2020 0855   ALT 34 08/05/2017 0902   BILITOT 1.2 11/20/2020 0855   BILITOT 1.39 (H) 08/05/2017 0902       RADIOGRAPHIC STUDIES: CUP PACEART REMOTE DEVICE CHECK  Result Date: 10/31/2020 Scheduled remote reviewed. Normal device function.  Next remote 91 days. HB  ASSESSMENT AND PLAN:  This is a very pleasant 75 years old white female with metastatic non-small cell lung cancer, adenocarcinoma with no actionable mutations status post systemic chemotherapy with carboplatin and Alimta and she is currently undergoing treatment with second line immunotherapy with Nivolumab status post 6 cycles. This was followed by immunotherapy with Nivolumab 480 mg IV every 4 weeks status post 47 cycles. The patient has been tolerating her treatment well with no concerning adverse effects. I recommended for her to proceed with cycle #48 today as planned. For the hypothyroidism, she will continue her current treatment with levothyroxine and will continue to monitor her TSH closely. For the cardiac arrhythmia and hypertension she is followed by cardiology. The patient will come back for follow-up visit in 4 weeks for evaluation before starting cycle #49. She was advised to call immediately if she has any  concerning symptoms in the interval. The patient voices understanding of current disease status and treatment options and is in agreement with the current care plan. All questions were answered. The patient knows to call the clinic with any problems, questions or concerns. We can certainly see the patient much sooner if necessary.  Disclaimer: This note was dictated with voice recognition software. Similar sounding words can inadvertently be transcribed and may not be corrected upon review.

## 2020-11-26 ENCOUNTER — Encounter: Payer: Self-pay | Admitting: *Deleted

## 2020-11-26 NOTE — Progress Notes (Signed)
Patient called and would like to have an appt provider change on 6/14.  I re-scheduled patient.

## 2020-12-18 ENCOUNTER — Inpatient Hospital Stay: Payer: Medicare Other

## 2020-12-18 ENCOUNTER — Inpatient Hospital Stay: Payer: Medicare Other | Attending: Internal Medicine

## 2020-12-18 ENCOUNTER — Telehealth: Payer: Self-pay | Admitting: Internal Medicine

## 2020-12-18 ENCOUNTER — Other Ambulatory Visit: Payer: Self-pay

## 2020-12-18 ENCOUNTER — Inpatient Hospital Stay (HOSPITAL_BASED_OUTPATIENT_CLINIC_OR_DEPARTMENT_OTHER): Payer: Medicare Other | Admitting: Internal Medicine

## 2020-12-18 DIAGNOSIS — Z79899 Other long term (current) drug therapy: Secondary | ICD-10-CM | POA: Insufficient documentation

## 2020-12-18 DIAGNOSIS — Z5112 Encounter for antineoplastic immunotherapy: Secondary | ICD-10-CM | POA: Diagnosis present

## 2020-12-18 DIAGNOSIS — R5383 Other fatigue: Secondary | ICD-10-CM

## 2020-12-18 DIAGNOSIS — C3411 Malignant neoplasm of upper lobe, right bronchus or lung: Secondary | ICD-10-CM | POA: Insufficient documentation

## 2020-12-18 DIAGNOSIS — C3490 Malignant neoplasm of unspecified part of unspecified bronchus or lung: Secondary | ICD-10-CM | POA: Diagnosis not present

## 2020-12-18 DIAGNOSIS — C3491 Malignant neoplasm of unspecified part of right bronchus or lung: Secondary | ICD-10-CM

## 2020-12-18 DIAGNOSIS — C773 Secondary and unspecified malignant neoplasm of axilla and upper limb lymph nodes: Secondary | ICD-10-CM | POA: Insufficient documentation

## 2020-12-18 LAB — CMP (CANCER CENTER ONLY)
ALT: 30 U/L (ref 0–44)
AST: 42 U/L — ABNORMAL HIGH (ref 15–41)
Albumin: 3.7 g/dL (ref 3.5–5.0)
Alkaline Phosphatase: 97 U/L (ref 38–126)
Anion gap: 8 (ref 5–15)
BUN: 8 mg/dL (ref 8–23)
CO2: 28 mmol/L (ref 22–32)
Calcium: 9.4 mg/dL (ref 8.9–10.3)
Chloride: 106 mmol/L (ref 98–111)
Creatinine: 0.73 mg/dL (ref 0.44–1.00)
GFR, Estimated: >60 mL/min (ref >60)
Glucose, Bld: 114 mg/dL — ABNORMAL HIGH (ref 70–99)
Potassium: 4.3 mmol/L (ref 3.5–5.1)
Sodium: 142 mmol/L (ref 135–145)
Total Bilirubin: 1.1 mg/dL (ref 0.3–1.2)
Total Protein: 6.9 g/dL (ref 6.5–8.1)

## 2020-12-18 LAB — CBC WITH DIFFERENTIAL (CANCER CENTER ONLY)
Abs Immature Granulocytes: 0.01 10*3/uL (ref 0.00–0.07)
Basophils Absolute: 0.1 10*3/uL (ref 0.0–0.1)
Basophils Relative: 1 %
Eosinophils Absolute: 0.2 10*3/uL (ref 0.0–0.5)
Eosinophils Relative: 3 %
HCT: 36.9 % (ref 36.0–46.0)
Hemoglobin: 12.9 g/dL (ref 12.0–15.0)
Immature Granulocytes: 0 %
Lymphocytes Relative: 26 %
Lymphs Abs: 1.5 10*3/uL (ref 0.7–4.0)
MCH: 33.9 pg (ref 26.0–34.0)
MCHC: 35 g/dL (ref 30.0–36.0)
MCV: 97.1 fL (ref 80.0–100.0)
Monocytes Absolute: 0.5 10*3/uL (ref 0.1–1.0)
Monocytes Relative: 8 %
Neutro Abs: 3.5 10*3/uL (ref 1.7–7.7)
Neutrophils Relative %: 62 %
Platelet Count: 169 10*3/uL (ref 150–400)
RBC: 3.8 MIL/uL — ABNORMAL LOW (ref 3.87–5.11)
RDW: 12.8 % (ref 11.5–15.5)
WBC Count: 5.7 10*3/uL (ref 4.0–10.5)
nRBC: 0 % (ref 0.0–0.2)

## 2020-12-18 LAB — TSH: TSH: 2.086 u[IU]/mL (ref 0.308–3.960)

## 2020-12-18 MED ORDER — SODIUM CHLORIDE 0.9 % IV SOLN
480.0000 mg | Freq: Once | INTRAVENOUS | Status: AC
  Administered 2020-12-18: 480 mg via INTRAVENOUS
  Filled 2020-12-18: qty 48

## 2020-12-18 MED ORDER — SODIUM CHLORIDE 0.9% FLUSH
10.0000 mL | INTRAVENOUS | Status: DC | PRN
  Administered 2020-12-18: 10 mL
  Filled 2020-12-18: qty 10

## 2020-12-18 MED ORDER — HEPARIN SOD (PORK) LOCK FLUSH 100 UNIT/ML IV SOLN
500.0000 [IU] | Freq: Once | INTRAVENOUS | Status: AC | PRN
Start: 2020-12-18 — End: 2020-12-18
  Administered 2020-12-18: 500 [IU]
  Filled 2020-12-18: qty 5

## 2020-12-18 MED ORDER — SODIUM CHLORIDE 0.9 % IV SOLN
Freq: Once | INTRAVENOUS | Status: AC
  Filled 2020-12-18: qty 250

## 2020-12-18 NOTE — Progress Notes (Signed)
[place] Telephone:([phone]) [phone]   Fax:(336) [phone]  OFFICE PROGRESS NOTE  Harris, [name], [name] [address] [place] [address]  DIAGNOSIS: Metastatic non-small cell lung cancer, adenocarcinoma. This was initially diagnosed as stage IB (T2a, N0, M0) in March of 2014, status post right upper lobectomy with lymph node dissection but the patient has evidence for disease recurrence in the right axilla status post resection.  PRIOR THERAPY:  1) Status post right axillary lymph node biopsy on 05/01/2014 under the care of Dr. Roxan Hockey. 2) Systemic chemotherapy with carboplatin for AUC of 5 and Alimta 500 mg/M2 every 3 weeks. First cycle 05/31/2014. Status post 6 cycles. Last dose was given 09/22/2014. 3) palliative radiotherapy to the subcarinal lymph node under the care of Dr. Sondra Come. 4) Nivolumab 240 mg IV every 2 weeks. First dose 12/23/2016. Status post 6 cycles.  CURRENT THERAPY:  Nivolumab 480 mg IV every 4 weeks. First dose 03/17/2017. Status post 48 cycles.  INTERVAL HISTORY: Nancy Hodges 75 y.o. female returns to the clinic for follow-up visit.  The patient is feeling fine today with no concerning complaints.  She denied having any chest pain, shortness of breath, cough or hemoptysis.  She denied having any fever or chills.  She has no nausea, vomiting, diarrhea or constipation.  She denied having any headache or visual changes.  She is here today for evaluation before starting cycle #49 of her treatment.  MEDICAL HISTORY: Past Medical History:  Diagnosis Date  . Allergic rhinitis   . Anxiety   . Arthritis   . Asthma   . Atrial flutter ([facility])   . Cholelithiases 09/26/2015  . Diverticulosis   . Encounter for antineoplastic immunotherapy 12/09/2016  . GERD (gastroesophageal reflux disease)   . H/O hiatal hernia   . History of blood transfusion   . History of chemotherapy   . History of kidney stones   . History of radiation therapy   .  Internal hemorrhoid   . Lung cancer ([facility])    a. Stage IB non-small cell carcinoma, s/p R VATS, wedge resection, RU lobectomy 10/2012.  [name] Persistent atrial fibrillation ([facility])    a. anticoagulated with Xarelto  . Pneumonia 06/2016   [facility]  . Presence of permanent cardiac pacemaker   . Pulmonary nodule   . 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    ALLERGIES:  is allergic to chlorpheniramine-dm, prednisone, septra [sulfamethoxazole-trimethoprim], vioxx [rofecoxib], molds & smuts, and penicillins.  MEDICATIONS:  Current Outpatient Medications  Medication Sig Dispense Refill  . acetaminophen (TYLENOL) 500 MG tablet Take 500 mg by mouth 3 (three) times daily as needed for moderate pain or fever.     . Acetaminophen-DM 1000-30 MG/30ML LIQD     . albuterol (PROVENTIL HFA;VENTOLIN HFA) 108 (90 BASE) MCG/ACT inhaler Inhale 2 puffs into the lungs every 6 (six) hours as needed for wheezing or shortness of breath.     [name] azelastine (ASTELIN) 0.1 % nasal spray Place 1 spray into both nostrils 2 (two) times daily. Use in each nostril as directed    . calcium carbonate (TUMS - DOSED IN MG ELEMENTAL CALCIUM) 500 MG chewable tablet Chew 1 tablet by mouth 2 (two) times daily as needed for indigestion or heartburn.     [name] CARTIA XT 240 MG 24 hr capsule TAKE 1 CAPSULE(240 MG) BY MOUTH DAILY 90 capsule 2  . dimenhyDRINATE (DRAMAMINE) 50 MG tablet Take 12.5 mg  by mouth every 8 (eight) hours as needed for dizziness.    [name] ipratropium-albuterol (DUONEB) 0.5-2.5 (3) MG/3ML SOLN Take 3 mLs by nebulization every 6 (six) hours as needed (shortness of breath or wheezing).     [name] levothyroxine (SYNTHROID) 50 MCG tablet TAKE 1 TABLET(50 MCG) BY MOUTH DAILY BEFORE AND BREAKFAST 90 tablet 0  . lidocaine-prilocaine (EMLA) cream APPLY TOPICALLY TO TO THE AFFECTED AREA AS NEEDED 30 g 4  . loratadine (CLARITIN) 10 MG tablet Take 10 mg by mouth daily.    [name] OVER THE COUNTER  MEDICATION Take 5 mLs by mouth daily as needed (cough). H[employer]'s kids cough syrup    . Polyvinyl Alcohol-Povidone (REFRESH OP) Place 1 drop into both eyes daily as needed (dry eyes).    [name] tablet TAKE 1 TABLET(20 MG) BY MOUTH DAILY WITH SUPPER 30 tablet 5   No current facility-administered medications for this visit.    SURGICAL HISTORY:  Past Surgical History:  Procedure Laterality Date  . CARDIOVERSION N/A 02/19/2018   Procedure: CARDIOVERSION;  Surgeon: Sanda Klein, MD;  Location: [facility];  Service: Cardiovascular;  Laterality: N/A;  . CARDIOVERSION N/A 04/30/2018   Procedure: CARDIOVERSION;  Surgeon: Thayer Headings, MD;  Location: [facility] ENDOSCOPY;  Service: Cardiovascular;  Laterality: N/A;  . CATARACT EXTRACTION W/PHACO Right 08/14/2016   Procedure: CATARACT EXTRACTION PHACO AND INTRAOCULAR LENS PLACEMENT RIGHT EYE CDE 10.53;  Surgeon: Tonny Branch, MD;  Location: [facility];  Service: Ophthalmology;  Laterality: Right;  right  . CATARACT EXTRACTION W/PHACO Left 09/25/2016   Procedure: CATARACT EXTRACTION PHACO AND INTRAOCULAR LENS PLACEMENT (IOC);  Surgeon: Tonny Branch, MD;  Location: [facility];  Service: Ophthalmology;  Laterality: Left;  left CDE 8.81  . COLONOSCOPY     [facility]: cattered sigmoid diverticula, internal hemorrhoids, sessile polyp in ascending and mid transverse colon (tubular adenoma).   . colonoscopy with polypectomy  2015  . ESOPHAGOGASTRODUODENOSCOPY     [facility]: bile reflux in stomach, mild gastritis, hiatal hernia  . ESOPHAGOGASTRODUODENOSCOPY N/A 01/31/2016   Procedure: ESOPHAGOGASTRODUODENOSCOPY (EGD);  Surgeon: [name], MD;  [facility];  Service: Endoscopy;  Laterality: N/A;  1200  . IR FLUORO GUIDE PORT INSERTION RIGHT  06/30/2017  . IR US GUIDE VASC ACCESS RIGHT  06/30/2017  . LOBECTOMY Right 10/27/2012   Procedure: LOBECTOMY;  Surgeon: [name], MD;  Location: [facility];  Service: Thoracic;  Laterality: Right;    RIGHT UPPER LOBECTOMY & Node Dissection  . LYMPH NODE BIOPSY Right 05/01/2014   Procedure: LYMPH NODE BIOPSY, Right Axillary;  Surgeon: [name], MD;  Location: [facility];  Service: Thoracic;  Laterality: Right;  . PACEMAKER IMPLANT N/A 10/30/2017   SJM Assurity MRI PPM implanted by Dr [name] once leadless pacemaker battery depleted  . PARTIAL HYSTERECTOMY    . PERMANENT PACEMAKER INSERTION N/A 09/14/2012   Nanostim (SJM) leadless pacemaker (LEADLESS II STUDY PATIENT) implanted by Dr [name], no longer functioning, device abandoned.  [name] VIDEO ASSISTED THORACOSCOPY (VATS)/WEDGE RESECTION Right 10/27/2012   Procedure: VIDEO ASSISTED THORACOSCOPY (VATS),RGHT UPPER LOBE LUNG WEDGE RESECTION;  Surgeon: [name], MD;  Location: [facility];  Service: Thoracic;  Laterality: Right;    REVIEW OF SYSTEMS:  A comprehensive review of systems was negative.   PHYSICAL EXAMINATION: General appearance: alert, cooperative and no distress Head: Normocephalic, without obvious abnormality, atraumatic Neck: no adenopathy, no JVD, supple, symmetrical, trachea midline and thyroid not enlarged, symmetric, no tenderness/mass/nodules Lymph nodes: Cervical, supraclavicular, and  axillary nodes normal. Resp: clear to auscultation bilaterally Back: symmetric, no curvature. ROM normal. No CVA tenderness. Cardio: regular rate and rhythm, S1, S2 normal, no murmur, click, rub or gallop GI: soft, non-tender; bowel sounds normal; no masses,  no organomegaly Extremities: extremities normal, atraumatic, no cyanosis or edema  ECOG PERFORMANCE STATUS: 1 - Symptomatic but completely ambulatory  Blood pressure 139/64, pulse 70, temperature 98.8 F (37.1 C), temperature source Tympanic, resp. rate 16, height 5\' 2"  (1.575 m), weight 146 lb 11.2 oz (66.5 kg), SpO2 99 %.  LABORATORY DATA: Lab Results  Component Value Date   WBC 5.7 12/18/2020   HGB 12.9 12/18/2020   HCT 36.9 12/18/2020   MCV 97.1 12/18/2020   PLT 169  12/18/2020      Chemistry      Component Value Date/Time   NA 140 11/20/2020 0855   NA 139 08/05/2017 0902   K 4.4 11/20/2020 0855   K 4.1 08/05/2017 0902   CL 103 11/20/2020 0855   CO2 27 11/20/2020 0855   CO2 28 08/05/2017 0902   BUN 8 11/20/2020 0855   BUN 10.1 08/05/2017 0902   CREATININE 0.78 11/20/2020 0855   CREATININE 0.8 08/05/2017 0902   GLU 134 (H) 02/18/2017 1410      Component Value Date/Time   CALCIUM 9.6 11/20/2020 0855   CALCIUM 9.5 08/05/2017 0902   ALKPHOS 101 11/20/2020 0855   ALKPHOS 111 08/05/2017 0902   AST 41 11/20/2020 0855   AST 33 08/05/2017 0902   ALT 31 11/20/2020 0855   ALT 34 08/05/2017 0902   BILITOT 1.2 11/20/2020 0855   BILITOT 1.39 (H) 08/05/2017 0902       RADIOGRAPHIC STUDIES: No results found. ASSESSMENT AND PLAN:  This is a very pleasant 75 years old white female with metastatic non-small cell lung cancer, adenocarcinoma with no actionable mutations status post systemic chemotherapy with carboplatin and Alimta and she is currently undergoing treatment with second line immunotherapy with Nivolumab status post 6 cycles. This was followed by immunotherapy with Nivolumab 480 mg IV every 4 weeks status post 48 cycles. The patient continues to tolerate her treatment well with no concerning adverse effects. I recommended for her to proceed with cycle #49 today as planned. I will see her back for follow-up visit in 4 weeks with repeat CT scan of the chest, abdomen pelvis for restaging of her disease For the hypothyroidism, she will continue her current treatment with levothyroxine and will continue to monitor her TSH closely. For the cardiac arrhythmia and hypertension she is followed by cardiology. The patient was advised to call immediately if she has any concerning symptoms in the interval. The patient voices understanding of current disease status and treatment options and is in agreement with the current care plan. All questions were  answered. The patient knows to call the clinic with any problems, questions or concerns. We can certainly see the patient much sooner if necessary.  Disclaimer: This note was dictated with voice recognition software. Similar sounding words can inadvertently be transcribed and may not be corrected upon review.

## 2020-12-18 NOTE — Telephone Encounter (Signed)
Scheduled per los. Gave avs and calendar  

## 2020-12-18 NOTE — Patient Instructions (Signed)
[facility]  ? Discharge Instructions: ?Thank you for choosing [facility] to provide your oncology and hematology care.  ? ?If you have a lab appointment with the Cancer Center, please go directly to the Cancer Center and check in at the registration area. ?  ?Wear comfortable clothing and clothing appropriate for easy access to any Portacath or PICC line.  ? ?We strive to give you quality time with your provider. You may need to reschedule your appointment if you arrive late (15 or more minutes).  Arriving late affects you and other patients whose appointments are after yours.  Also, if you miss three or more appointments without notifying the office, you may be dismissed from the clinic at the provider?s discretion.    ?  ?For prescription refill requests, have your pharmacy contact our office and allow 72 hours for refills to be completed.   ? ?Today you received the following chemotherapy and/or immunotherapy agents: nivolumab    ?  ?To help prevent nausea and vomiting after your treatment, we encourage you to take your nausea medication as directed. ? ?BELOW ARE SYMPTOMS THAT SHOULD BE REPORTED IMMEDIATELY: ?*FEVER GREATER THAN 100.4 F (38 ?C) OR HIGHER ?*CHILLS OR SWEATING ?*NAUSEA AND VOMITING THAT IS NOT CONTROLLED WITH YOUR NAUSEA MEDICATION ?*UNUSUAL SHORTNESS OF BREATH ?*UNUSUAL BRUISING OR BLEEDING ?*URINARY PROBLEMS (pain or burning when urinating, or frequent urination) ?*BOWEL PROBLEMS (unusual diarrhea, constipation, pain near the anus) ?TENDERNESS IN MOUTH AND THROAT WITH OR WITHOUT PRESENCE OF ULCERS (sore throat, sores in mouth, or a toothache) ?UNUSUAL RASH, SWELLING OR PAIN  ?UNUSUAL VAGINAL DISCHARGE OR ITCHING  ? ?Items with * indicate a potential emergency and should be followed up as soon as possible or go to the Emergency Department if any problems should occur. ? ?Please show the CHEMOTHERAPY ALERT CARD or IMMUNOTHERAPY ALERT CARD at check-in  to the Emergency Department and triage nurse. ? ?Should you have questions after your visit or need to cancel or reschedule your appointment, please contact [facility]  Dept: [phone]  and follow the prompts.  Office hours are 8:00 a.m. to 4:30 p.m. Monday - Friday. Please note that voicemails left after 4:00 p.m. may not be returned until the following business day.  We are closed weekends and major holidays. You have access to a nurse at all times for urgent questions. Please call the main number to the clinic Dept: [phone] and follow the prompts. ? ? ?For any non-urgent questions, you may also contact your provider using MyChart. We now offer e-Visits for anyone 18 and older to request care online for non-urgent symptoms. For details visit [email or web address]. ?  ?Also download the MyChart app! Go to the app store, search "MyChart", open the app, select [facility], and log in with your MyChart username and password. ? ?Due to Covid, a mask is required upon entering the hospital/clinic. If you do not have a mask, one will be given to you upon arrival. For doctor visits, patients may have 1 support person aged 18 or older with them. For treatment visits, patients cannot have anyone with them due to current Covid guidelines and our immunocompromised population.  ? ?

## 2021-01-01 ENCOUNTER — Other Ambulatory Visit: Payer: Self-pay | Admitting: Internal Medicine

## 2021-01-14 ENCOUNTER — Ambulatory Visit (HOSPITAL_COMMUNITY)
Admission: RE | Admit: 2021-01-14 | Discharge: 2021-01-14 | Disposition: A | Discharge status: Home / Self Care | Payer: Medicare Other | Source: Ambulatory Visit | Attending: Internal Medicine | Admitting: Internal Medicine

## 2021-01-14 ENCOUNTER — Inpatient Hospital Stay: Payer: Medicare Other | Attending: Internal Medicine

## 2021-01-14 ENCOUNTER — Other Ambulatory Visit: Payer: Self-pay

## 2021-01-14 DIAGNOSIS — R5383 Other fatigue: Secondary | ICD-10-CM | POA: Insufficient documentation

## 2021-01-14 DIAGNOSIS — J439 Emphysema, unspecified: Secondary | ICD-10-CM | POA: Diagnosis not present

## 2021-01-14 DIAGNOSIS — K760 Fatty (change of) liver, not elsewhere classified: Secondary | ICD-10-CM | POA: Insufficient documentation

## 2021-01-14 DIAGNOSIS — C773 Secondary and unspecified malignant neoplasm of axilla and upper limb lymph nodes: Secondary | ICD-10-CM | POA: Insufficient documentation

## 2021-01-14 DIAGNOSIS — Z95828 Presence of other vascular implants and grafts: Secondary | ICD-10-CM

## 2021-01-14 DIAGNOSIS — K7460 Unspecified cirrhosis of liver: Secondary | ICD-10-CM | POA: Insufficient documentation

## 2021-01-14 DIAGNOSIS — K8020 Calculus of gallbladder without cholecystitis without obstruction: Secondary | ICD-10-CM | POA: Insufficient documentation

## 2021-01-14 DIAGNOSIS — I2510 Atherosclerotic heart disease of native coronary artery without angina pectoris: Secondary | ICD-10-CM | POA: Diagnosis not present

## 2021-01-14 DIAGNOSIS — C3491 Malignant neoplasm of unspecified part of right bronchus or lung: Secondary | ICD-10-CM | POA: Insufficient documentation

## 2021-01-14 DIAGNOSIS — I700 Atherosclerosis of aorta: Secondary | ICD-10-CM | POA: Insufficient documentation

## 2021-01-14 DIAGNOSIS — Z5112 Encounter for antineoplastic immunotherapy: Secondary | ICD-10-CM | POA: Insufficient documentation

## 2021-01-14 DIAGNOSIS — C3411 Malignant neoplasm of upper lobe, right bronchus or lung: Secondary | ICD-10-CM | POA: Insufficient documentation

## 2021-01-14 DIAGNOSIS — Z79899 Other long term (current) drug therapy: Secondary | ICD-10-CM | POA: Insufficient documentation

## 2021-01-14 DIAGNOSIS — C3490 Malignant neoplasm of unspecified part of unspecified bronchus or lung: Secondary | ICD-10-CM

## 2021-01-14 LAB — CMP (CANCER CENTER ONLY)
ALT: 26 U/L (ref 0–44)
AST: 39 U/L (ref 15–41)
Albumin: 4 g/dL (ref 3.5–5.0)
Alkaline Phosphatase: 97 U/L (ref 38–126)
Anion gap: 8 (ref 5–15)
BUN: 8 mg/dL (ref 8–23)
CO2: 28 mmol/L (ref 22–32)
Calcium: 9.9 mg/dL (ref 8.9–10.3)
Chloride: 104 mmol/L (ref 98–111)
Creatinine: 0.77 mg/dL (ref 0.44–1.00)
GFR, Estimated: >60 mL/min (ref >60)
Glucose, Bld: 119 mg/dL — ABNORMAL HIGH (ref 70–99)
Potassium: 4.4 mmol/L (ref 3.5–5.1)
Sodium: 140 mmol/L (ref 135–145)
Total Bilirubin: 1.7 mg/dL — ABNORMAL HIGH (ref 0.3–1.2)
Total Protein: 7.3 g/dL (ref 6.5–8.1)

## 2021-01-14 LAB — CBC WITH DIFFERENTIAL (CANCER CENTER ONLY)
Abs Immature Granulocytes: 0.02 10*3/uL (ref 0.00–0.07)
Basophils Absolute: 0 10*3/uL (ref 0.0–0.1)
Basophils Relative: 1 %
Eosinophils Absolute: 0.1 10*3/uL (ref 0.0–0.5)
Eosinophils Relative: 2 %
HCT: 38.9 % (ref 36.0–46.0)
Hemoglobin: 13.8 g/dL (ref 12.0–15.0)
Immature Granulocytes: 0 %
Lymphocytes Relative: 25 %
Lymphs Abs: 1.4 10*3/uL (ref 0.7–4.0)
MCH: 33.8 pg (ref 26.0–34.0)
MCHC: 35.5 g/dL (ref 30.0–36.0)
MCV: 95.3 fL (ref 80.0–100.0)
Monocytes Absolute: 0.4 10*3/uL (ref 0.1–1.0)
Monocytes Relative: 7 %
Neutro Abs: 3.8 10*3/uL (ref 1.7–7.7)
Neutrophils Relative %: 65 %
Platelet Count: 183 10*3/uL (ref 150–400)
RBC: 4.08 MIL/uL (ref 3.87–5.11)
RDW: 12.8 % (ref 11.5–15.5)
WBC Count: 5.8 10*3/uL (ref 4.0–10.5)
nRBC: 0 % (ref 0.0–0.2)

## 2021-01-14 LAB — TSH: TSH: 4.192 u[IU]/mL — ABNORMAL HIGH (ref 0.308–3.960)

## 2021-01-14 MED ORDER — HEPARIN SOD (PORK) LOCK FLUSH 100 UNIT/ML IV SOLN
500.0000 [IU] | Freq: Once | INTRAVENOUS | Status: AC
  Administered 2021-01-14: 500 [IU] via INTRAVENOUS

## 2021-01-14 MED ORDER — SODIUM CHLORIDE (PF) 0.9 % IJ SOLN
INTRAMUSCULAR | Status: AC
  Filled 2021-01-14: qty 50

## 2021-01-14 MED ORDER — HEPARIN SOD (PORK) LOCK FLUSH 100 UNIT/ML IV SOLN
INTRAVENOUS | Status: AC
  Filled 2021-01-14: qty 5

## 2021-01-14 MED ORDER — IOHEXOL 300 MG/ML  SOLN
100.0000 mL | Freq: Once | INTRAMUSCULAR | Status: AC | PRN
  Administered 2021-01-14: 100 mL via INTRAVENOUS

## 2021-01-14 MED ORDER — SODIUM CHLORIDE 0.9% FLUSH
10.0000 mL | INTRAVENOUS | Status: DC | PRN
  Administered 2021-01-14: 10 mL via INTRAVENOUS
  Filled 2021-01-14: qty 10

## 2021-01-14 NOTE — Patient Instructions (Signed)
Implanted Port Home Guide An implanted port is a device that is placed under the skin. It is usually placed in the chest. The device can be used to give IV medicine, to take blood, or for dialysis. You may have an implanted port if: You need IV medicine that would be irritating to the small veins in your hands or arms. You need IV medicines, such as antibiotics, for a long period of time. You need IV nutrition for a long period of time. You need dialysis. When you have a port, your health care provider can choose to use the port instead of veins in your arms for these procedures. You may have fewer limitations when using a port than you would if you used other types of long-term IVs, and you will likely be able to return to normal activities afteryour incision heals. An implanted port has two main parts: Reservoir. The reservoir is the part where a needle is inserted to give medicines or draw blood. The reservoir is round. After it is placed, it appears as a small, raised area under your skin. Catheter. The catheter is a thin, flexible tube that connects the reservoir to a vein. Medicine that is inserted into the reservoir goes into the catheter and then into the vein. How is my port accessed? To access your port: A numbing cream may be placed on the skin over the port site. Your health care provider will put on a mask and sterile gloves. The skin over your port will be cleaned carefully with a germ-killing soap and allowed to dry. Your health care provider will gently pinch the port and insert a needle into it. Your health care provider will check for a blood return to make sure the port is in the vein and is not clogged. If your port needs to remain accessed to get medicine continuously (constant infusion), your health care provider will place a clear bandage (dressing) over the needle site. The dressing and needle will need to be changed every week, or as told by your health care provider. What  is flushing? Flushing helps keep the port from getting clogged. Follow instructions from your health care provider about how and when to flush the port. Ports are usually flushed with saline solution or a medicine called heparin. The need for flushing will depend on how the port is used: If the port is only used from time to time to give medicines or draw blood, the port may need to be flushed: Before and after medicines have been given. Before and after blood has been drawn. As part of routine maintenance. Flushing may be recommended every 4-6 weeks. If a constant infusion is running, the port may not need to be flushed. Throw away any syringes in a disposal container that is meant for sharp items (sharps container). You can buy a sharps container from a pharmacy, or you can make one by using an empty hard plastic bottle with a cover. How long will my port stay implanted? The port can stay in for as long as your health care provider thinks it is needed. When it is time for the port to come out, a surgery will be done to remove it. The surgery will be similar to the procedure that was done to putthe port in. Follow these instructions at home:  Flush your port as told by your health care provider. If you need an infusion over several days, follow instructions from your health care provider about how to take   care of your port site. Make sure you: Wash your hands with soap and water before you change your dressing. If soap and water are not available, use alcohol-based hand sanitizer. Change your dressing as told by your health care provider. Place any used dressings or infusion bags into a plastic bag. Throw that bag in the trash. Keep the dressing that covers the needle clean and dry. Do not get it wet. Do not use scissors or sharp objects near the tube. Keep the tube clamped, unless it is being used. Check your port site every day for signs of infection. Check for: Redness, swelling, or  pain. Fluid or blood. Pus or a bad smell. Protect the skin around the port site. Avoid wearing bra straps that rub or irritate the site. Protect the skin around your port from seat belts. Place a soft pad over your chest if needed. Bathe or shower as told by your health care provider. The site may get wet as long as you are not actively receiving an infusion. Return to your normal activities as told by your health care provider. Ask your health care provider what activities are safe for you. Carry a medical alert card or wear a medical alert bracelet at all times. This will let health care providers know that you have an implanted port in case of an emergency. Get help right away if: You have redness, swelling, or pain at the port site. You have fluid or blood coming from your port site. You have pus or a bad smell coming from the port site. You have a fever. Summary Implanted ports are usually placed in the chest for long-term IV access. Follow instructions from your health care provider about flushing the port and changing bandages (dressings). Take care of the area around your port by avoiding clothing that puts pressure on the area, and by watching for signs of infection. Protect the skin around your port from seat belts. Place a soft pad over your chest if needed. Get help right away if you have a fever or you have redness, swelling, pain, drainage, or a bad smell at the port site. This information is not intended to replace advice given to you by your health care provider. Make sure you discuss any questions you have with your healthcare provider. Document Revised: 12/05/2019 Document Reviewed: 12/05/2019 Elsevier Patient Education  2022 [employer]  

## 2021-01-15 ENCOUNTER — Inpatient Hospital Stay: Payer: Medicare Other

## 2021-01-15 ENCOUNTER — Encounter: Payer: Self-pay | Admitting: *Deleted

## 2021-01-15 ENCOUNTER — Other Ambulatory Visit: Payer: Medicare Other

## 2021-01-15 ENCOUNTER — Encounter: Payer: Self-pay | Admitting: Internal Medicine

## 2021-01-15 ENCOUNTER — Ambulatory Visit: Payer: Medicare Other | Admitting: Physician Assistant

## 2021-01-15 ENCOUNTER — Inpatient Hospital Stay (HOSPITAL_BASED_OUTPATIENT_CLINIC_OR_DEPARTMENT_OTHER): Payer: Medicare Other | Admitting: Internal Medicine

## 2021-01-15 VITALS — BP 139/60 | HR 85 | Temp 97.3°F | Resp 18 | Ht 62.0 in | Wt 143.6 lb

## 2021-01-15 DIAGNOSIS — E039 Hypothyroidism, unspecified: Secondary | ICD-10-CM

## 2021-01-15 DIAGNOSIS — C773 Secondary and unspecified malignant neoplasm of axilla and upper limb lymph nodes: Secondary | ICD-10-CM

## 2021-01-15 DIAGNOSIS — Z5112 Encounter for antineoplastic immunotherapy: Secondary | ICD-10-CM

## 2021-01-15 DIAGNOSIS — Z79899 Other long term (current) drug therapy: Secondary | ICD-10-CM | POA: Diagnosis not present

## 2021-01-15 DIAGNOSIS — C3411 Malignant neoplasm of upper lobe, right bronchus or lung: Secondary | ICD-10-CM | POA: Diagnosis present

## 2021-01-15 DIAGNOSIS — C3491 Malignant neoplasm of unspecified part of right bronchus or lung: Secondary | ICD-10-CM

## 2021-01-15 MED ORDER — SODIUM CHLORIDE 0.9 % IV SOLN
Freq: Once | INTRAVENOUS | Status: AC
  Filled 2021-01-15: qty 250

## 2021-01-15 MED ORDER — HEPARIN SOD (PORK) LOCK FLUSH 100 UNIT/ML IV SOLN
500.0000 [IU] | Freq: Once | INTRAVENOUS | Status: AC | PRN
  Administered 2021-01-15: 500 [IU]
  Filled 2021-01-15: qty 5

## 2021-01-15 MED ORDER — SODIUM CHLORIDE 0.9% FLUSH
10.0000 mL | INTRAVENOUS | Status: DC | PRN
  Administered 2021-01-15: 10 mL
  Filled 2021-01-15: qty 10

## 2021-01-15 MED ORDER — SODIUM CHLORIDE 0.9 % IV SOLN
480.0000 mg | Freq: Once | INTRAVENOUS | Status: AC
  Administered 2021-01-15: 480 mg via INTRAVENOUS
  Filled 2021-01-15: qty 48

## 2021-01-15 NOTE — Progress Notes (Signed)
[facility] Telephone:(336) [phone]   Fax:(336) [phone]  OFFICE PROGRESS NOTE  Harris, [name], [name] [address] [place] [address]  DIAGNOSIS: Metastatic non-small cell lung cancer, adenocarcinoma. This was initially diagnosed as stage IB (T2a, N0, M0) in March of 2014, status post right upper lobectomy with lymph node dissection but the patient has evidence for disease recurrence in the right axilla status post resection.  PRIOR THERAPY:  1) Status post right axillary lymph node biopsy on 05/01/2014 under the care of Dr. Roxan Hockey. 2) Systemic chemotherapy with carboplatin for AUC of 5 and Alimta 500 mg/M2 every 3 weeks. First cycle 05/31/2014. Status post 6 cycles. Last dose was given 09/22/2014. 3) palliative radiotherapy to the subcarinal lymph node under the care of Dr. Sondra Come. 4) Nivolumab 240 mg IV every 2 weeks. First dose 12/23/2016. Status post 6 cycles.  CURRENT THERAPY:  Nivolumab 480 mg IV every 4 weeks. First dose 03/17/2017. Status post 49 cycles.  INTERVAL HISTORY: Nancy Hodges 75 y.o. female returns to the clinic today for follow-up visit.  The patient is feeling fine with no concerning complaints except for mild fatigue and shortness of breath with exertion mainly with the humid weather.  She still very active and works in her yard.  She denied having any chest pain, cough or hemoptysis.  She denied having any fever or chills.  She has no nausea, vomiting, diarrhea or constipation.  She has no headache or visual changes.  The patient continues to tolerate her treatment with nivolumab fairly well.  She had repeat CT scan of the chest, abdomen pelvis performed recently and she is here for evaluation and discussion of her risk her results.  MEDICAL HISTORY: Past Medical History:  Diagnosis Date   Allergic rhinitis    Anxiety    Arthritis    Asthma    Atrial flutter ([place])    Cholelithiases 09/26/2015   Diverticulosis    Encounter  for antineoplastic immunotherapy 12/09/2016   GERD (gastroesophageal reflux disease)    H/O hiatal hernia    History of blood transfusion    History of chemotherapy    History of kidney stones    History of radiation therapy    Internal hemorrhoid    Lung cancer ([place])    a. Stage IB non-small cell carcinoma, s/p R VATS, wedge resection, RU lobectomy 10/2012.   Persistent atrial fibrillation ([facility])    a. anticoagulated with Xarelto   Pneumonia 06/2016   [facility]   Presence of permanent cardiac pacemaker    Pulmonary nodule    Tachycardia-bradycardia syndrome ([place])    a. 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    ALLERGIES:  is allergic to chlorpheniramine-dm, prednisone, septra [sulfamethoxazole-trimethoprim], vioxx [rofecoxib], molds & smuts, and penicillins.  MEDICATIONS:  Current Outpatient Medications  Medication Sig Dispense Refill   acetaminophen (TYLENOL) 500 MG tablet Take 500 mg by mouth 3 (three) times daily as needed for moderate pain or fever.      Acetaminophen-DM 1000-30 MG/30ML LIQD      albuterol (PROVENTIL HFA;VENTOLIN HFA) 108 (90 BASE) MCG/ACT inhaler Inhale 2 puffs into the lungs every 6 (six) hours as needed for wheezing or shortness of breath.      azelastine (ASTELIN) 0.1 % nasal spray Place 1 spray into both nostrils 2 (two) times daily. Use in each nostril as directed     calcium carbonate (TUMS - DOSED IN MG ELEMENTAL CALCIUM) 500 MG chewable tablet Chew 1  tablet by mouth 2 (two) times daily as needed for indigestion or heartburn.      diltiazem (CARTIA XT) 240 MG 24 hr capsule Take 1 capsule (240 mg total) by mouth daily. Please let pt know to keep upcoming appt in Aug for further refills 90 capsule 0   dimenhyDRINATE (DRAMAMINE) 50 MG tablet Take 12.5 mg by mouth every 8 (eight) hours as needed for dizziness.     ipratropium-albuterol (DUONEB) 0.5-2.5 (3) MG/3ML SOLN Take 3 mLs by nebulization every 6 (six) hours as needed (shortness of breath or  wheezing).      levothyroxine (SYNTHROID) 50 MCG tablet TAKE 1 TABLET(50 MCG) BY MOUTH DAILY BEFORE AND BREAKFAST 90 tablet 0   lidocaine-prilocaine (EMLA) cream APPLY TOPICALLY TO TO THE AFFECTED AREA AS NEEDED 30 g 4   loratadine (CLARITIN) 10 MG tablet Take 10 mg by mouth daily.     OVER THE COUNTER MEDICATION Take 5 mLs by mouth daily as needed (cough). [employer] kids cough syrup     Polyvinyl Alcohol-Povidone (REFRESH OP) Place 1 drop into both eyes daily as needed (dry eyes).     XARELTO 20 MG TABS tablet TAKE 1 TABLET(20 MG) BY MOUTH DAILY WITH SUPPER 30 tablet 5   No current facility-administered medications for this visit.    SURGICAL HISTORY:  Past Surgical History:  Procedure Laterality Date   CARDIOVERSION N/A 02/19/2018   Procedure: CARDIOVERSION;  Surgeon: Sanda Klein, MD;  Location: [facility] ENDOSCOPY;  Service: Cardiovascular;  Laterality: N/A;   CARDIOVERSION N/A 04/30/2018   Procedure: CARDIOVERSION;  Surgeon: Thayer Headings, MD;  Location: [facility] ENDOSCOPY;  Service: Cardiovascular;  Laterality: N/A;   CATARACT EXTRACTION W/PHACO Right 08/14/2016   Procedure: CATARACT EXTRACTION PHACO AND INTRAOCULAR LENS PLACEMENT RIGHT EYE CDE 10.53;  Surgeon: Tonny Branch, MD;  Location: [facility] ORS;  Service: Ophthalmology;  Laterality: Right;  right   CATARACT EXTRACTION W/PHACO Left 09/25/2016   Procedure: CATARACT EXTRACTION PHACO AND INTRAOCULAR LENS PLACEMENT (IOC);  Surgeon: Tonny Branch, MD;  Location: [facility] ORS;  Service: Ophthalmology;  Laterality: Left;  left CDE 8.81   COLONOSCOPY     [facility]: cattered sigmoid diverticula, internal hemorrhoids, sessile polyp in ascending and mid transverse colon (tubular adenoma).    colonoscopy with polypectomy  2015   ESOPHAGOGASTRODUODENOSCOPY     [facility]: bile reflux in stomach, mild gastritis, hiatal hernia   ESOPHAGOGASTRODUODENOSCOPY N/A 01/31/2016   Procedure: ESOPHAGOGASTRODUODENOSCOPY (EGD);  Surgeon: [name], MD;  Location: [facility];   Service: Endoscopy;  Laterality: N/A;  1200   IR FLUORO GUIDE PORT INSERTION RIGHT  06/30/2017   IR US GUIDE VASC ACCESS RIGHT  06/30/2017   LOBECTOMY Right 10/27/2012   Procedure: LOBECTOMY;  Surgeon: [name], MD;  Location: [facility];  Service: Thoracic;  Laterality: Right;   RIGHT UPPER LOBECTOMY & Node Dissection   LYMPH NODE BIOPSY Right 05/01/2014   Procedure: LYMPH NODE BIOPSY, Right Axillary;  Surgeon: [name], MD;  Location: [facility];  Service: Thoracic;  Laterality: Right;   PACEMAKER IMPLANT N/A 10/30/2017   SJM Assurity MRI PPM implanted by Dr [name] once leadless pacemaker battery depleted   PARTIAL HYSTERECTOMY     PERMANENT PACEMAKER INSERTION N/A 09/14/2012   Nanostim (SJM) leadless pacemaker (LEADLESS II STUDY PATIENT) implanted by Dr [name], no longer functioning, device abandoned.   VIDEO ASSISTED THORACOSCOPY (VATS)/WEDGE RESECTION Right 10/27/2012   Procedure: VIDEO ASSISTED THORACOSCOPY (VATS),RGHT UPPER LOBE LUNG WEDGE RESECTION;  Surgeon: [name], MD;  Location: [facility] OR;  Service: Thoracic;  Laterality: Right;    REVIEW OF SYSTEMS:  Constitutional: positive for fatigue Eyes: negative Ears, nose, mouth, throat, and face: negative Respiratory: positive for dyspnea on exertion Cardiovascular: negative Gastrointestinal: negative Genitourinary:negative Integument/breast: negative Hematologic/lymphatic: negative Musculoskeletal:negative Neurological: negative Behavioral/Psych: negative Endocrine: negative Allergic/Immunologic: negative   PHYSICAL EXAMINATION: General appearance: alert, cooperative and no distress Head: Normocephalic, without obvious abnormality, atraumatic Neck: no adenopathy, no JVD, supple, symmetrical, trachea midline and thyroid not enlarged, symmetric, no tenderness/mass/nodules Lymph nodes: Cervical, supraclavicular, and axillary nodes normal. Resp: clear to auscultation bilaterally Back: symmetric, no curvature.  ROM normal. No CVA tenderness. Cardio: regular rate and rhythm, S1, S2 normal, no murmur, click, rub or gallop GI: soft, non-tender; bowel sounds normal; no masses,  no organomegaly Extremities: extremities normal, atraumatic, no cyanosis or edema  ECOG PERFORMANCE STATUS: 1 - Symptomatic but completely ambulatory  Blood pressure 139/60, pulse 85, temperature (!) 97.3 F (36.3 C), temperature source Tympanic, resp. rate 18, height 5\' 2"  (1.575 m), weight 143 lb 9.6 oz (65.1 kg), SpO2 98 %.  LABORATORY DATA: Lab Results  Component Value Date   WBC 5.8 01/14/2021   HGB 13.8 01/14/2021   HCT 38.9 01/14/2021   MCV 95.3 01/14/2021   PLT 183 01/14/2021      Chemistry      Component Value Date/Time   NA 140 01/14/2021 1135   NA 139 08/05/2017 0902   K 4.4 01/14/2021 1135   K 4.1 08/05/2017 0902   CL 104 01/14/2021 1135   CO2 28 01/14/2021 1135   CO2 28 08/05/2017 0902   BUN 8 01/14/2021 1135   BUN 10.1 08/05/2017 0902   CREATININE 0.77 01/14/2021 1135   CREATININE 0.8 08/05/2017 0902   GLU 134 (H) 02/18/2017 1410      Component Value Date/Time   CALCIUM 9.9 01/14/2021 1135   CALCIUM 9.5 08/05/2017 0902   ALKPHOS 97 01/14/2021 1135   ALKPHOS 111 08/05/2017 0902   AST 39 01/14/2021 1135   AST 33 08/05/2017 0902   ALT 26 01/14/2021 1135   ALT 34 08/05/2017 0902   BILITOT 1.7 (H) 01/14/2021 1135   BILITOT 1.39 (H) 08/05/2017 0902       RADIOGRAPHIC STUDIES: CT Chest W Contrast  Result Date: 01/14/2021 CLINICAL DATA:  Metastatic lung adenocarcinoma, status post right upper lobectomy EXAM: CT CHEST, ABDOMEN, AND PELVIS WITH CONTRAST TECHNIQUE: Multidetector CT imaging of the chest, abdomen and pelvis was performed following the standard protocol during bolus administration of intravenous contrast. CONTRAST:  124mL OMNIPAQUE IOHEXOL 300 MG/ML SOLN, additional oral enteric contrast COMPARISON:  09/21/2020 FINDINGS: CT CHEST FINDINGS Cardiovascular: Aortic atherosclerosis. Right  chest port catheter. Left chest multi lead pacer. Normal heart size. Left coronary artery calcifications. No pericardial effusion. Mediastinum/Nodes: Slight interval increase in size of subcarinal lymph node, measuring 2.1 x 1.4 cm, previously 1.8 x 1.3 cm (series 2, image 26). Thyroid gland, trachea, and esophagus demonstrate no significant findings. Lungs/Pleura: Mild centrilobular emphysema. Diffuse bilateral bronchial wall thickening. Status post right upper lobectomy. Unchanged, mild paramedian radiation fibrosis of the right lower lobe (series 4, image 53). Unchanged 3 mm nodule of the posterior left upper lobe (series 4, image 31). No pleural effusion or pneumothorax. Musculoskeletal: No chest wall mass or suspicious bone lesions identified. CT ABDOMEN PELVIS FINDINGS Hepatobiliary: Coarse, nodular cirrhotic morphology of the liver. Hepatic steatosis. No solid liver abnormality is seen. Gallstones in the gallbladder. Gallbladder wall thickening, or biliary dilatation. Pancreas: Unremarkable. No pancreatic ductal  dilatation or surrounding inflammatory changes. Spleen: Normal in size without significant abnormality. Adrenals/Urinary Tract: Adrenal glands are unremarkable. Kidneys are normal, without renal calculi, solid lesion, or hydronephrosis. Bladder is unremarkable. Stomach/Bowel: Stomach is within normal limits. Appendix appears normal. No evidence of bowel wall thickening, distention, or inflammatory changes. Descending and sigmoid diverticulosis. Vascular/Lymphatic: Aortic atherosclerosis. Unchanged prominent portacaval and gastrohepatic ligament lymph nodes. Reproductive: No mass or other abnormality. Other: No abdominal wall hernia or abnormality. No abdominopelvic ascites. Musculoskeletal: No acute or significant osseous findings. IMPRESSION: 1. Slight interval increase in size of subcarinal lymph node, measuring 2.1 x 1.4 cm, previously 1.8 x 1.3 cm. Findings are suspicious for worsened nodal  metastatic disease. 2. Unchanged postoperative findings of right upper lobectomy and mild paramedian radiation fibrosis of the right lower lobe. 3. Unchanged 3 mm nodule of the posterior left upper lobe, almost certainly incidental and benign. 4. Unchanged prominent portacaval and gastrohepatic ligament lymph nodes, likely secondary to cirrhosis, abdominal nodal metastatic disease not favored. 5. Hepatic steatosis and cirrhosis. 6. Cholelithiasis. 7. Emphysema. 8. Coronary artery disease. Aortic Atherosclerosis (ICD10-I70.0) and Emphysema (ICD10-J43.9). Electronically Signed   By: Eddie Candle M.D.   On: 01/14/2021 16:21   CT Abdomen Pelvis W Contrast  Result Date: 01/14/2021 CLINICAL DATA:  Metastatic lung adenocarcinoma, status post right upper lobectomy EXAM: CT CHEST, ABDOMEN, AND PELVIS WITH CONTRAST TECHNIQUE: Multidetector CT imaging of the chest, abdomen and pelvis was performed following the standard protocol during bolus administration of intravenous contrast. CONTRAST:  115mL OMNIPAQUE IOHEXOL 300 MG/ML SOLN, additional oral enteric contrast COMPARISON:  09/21/2020 FINDINGS: CT CHEST FINDINGS Cardiovascular: Aortic atherosclerosis. Right chest port catheter. Left chest multi lead pacer. Normal heart size. Left coronary artery calcifications. No pericardial effusion. Mediastinum/Nodes: Slight interval increase in size of subcarinal lymph node, measuring 2.1 x 1.4 cm, previously 1.8 x 1.3 cm (series 2, image 26). Thyroid gland, trachea, and esophagus demonstrate no significant findings. Lungs/Pleura: Mild centrilobular emphysema. Diffuse bilateral bronchial wall thickening. Status post right upper lobectomy. Unchanged, mild paramedian radiation fibrosis of the right lower lobe (series 4, image 53). Unchanged 3 mm nodule of the posterior left upper lobe (series 4, image 31). No pleural effusion or pneumothorax. Musculoskeletal: No chest wall mass or suspicious bone lesions identified. CT ABDOMEN PELVIS  FINDINGS Hepatobiliary: Coarse, nodular cirrhotic morphology of the liver. Hepatic steatosis. No solid liver abnormality is seen. Gallstones in the gallbladder. Gallbladder wall thickening, or biliary dilatation. Pancreas: Unremarkable. No pancreatic ductal dilatation or surrounding inflammatory changes. Spleen: Normal in size without significant abnormality. Adrenals/Urinary Tract: Adrenal glands are unremarkable. Kidneys are normal, without renal calculi, solid lesion, or hydronephrosis. Bladder is unremarkable. Stomach/Bowel: Stomach is within normal limits. Appendix appears normal. No evidence of bowel wall thickening, distention, or inflammatory changes. Descending and sigmoid diverticulosis. Vascular/Lymphatic: Aortic atherosclerosis. Unchanged prominent portacaval and gastrohepatic ligament lymph nodes. Reproductive: No mass or other abnormality. Other: No abdominal wall hernia or abnormality. No abdominopelvic ascites. Musculoskeletal: No acute or significant osseous findings. IMPRESSION: 1. Slight interval increase in size of subcarinal lymph node, measuring 2.1 x 1.4 cm, previously 1.8 x 1.3 cm. Findings are suspicious for worsened nodal metastatic disease. 2. Unchanged postoperative findings of right upper lobectomy and mild paramedian radiation fibrosis of the right lower lobe. 3. Unchanged 3 mm nodule of the posterior left upper lobe, almost certainly incidental and benign. 4. Unchanged prominent portacaval and gastrohepatic ligament lymph nodes, likely secondary to cirrhosis, abdominal nodal metastatic disease not favored. 5. Hepatic steatosis and cirrhosis. 6. Cholelithiasis.  7. Emphysema. 8. Coronary artery disease. Aortic Atherosclerosis (ICD10-I70.0) and Emphysema (ICD10-J43.9). Electronically Signed   By: Eddie Candle M.D.   On: 01/14/2021 16:21    ASSESSMENT AND PLAN:  This is a very pleasant 75 years old white female with metastatic non-small cell lung cancer, adenocarcinoma with no  actionable mutations status post systemic chemotherapy with carboplatin and Alimta and she is currently undergoing treatment with second line immunotherapy with Nivolumab status post 6 cycles. This was followed by immunotherapy with Nivolumab 480 mg IV every 4 weeks status post 49 cycles. The patient continues to tolerate her treatment with nivolumab fairly well. She had repeat CT scan of the chest, abdomen pelvis performed recently.  I personally and independently reviewed the scan images and discussed the results with the patient today. Her scan showed no concerning findings for disease progression except for slight increase in size of subcarinal lymph node  which we will monitor on upcoming imaging studies. I recommended for the patient to continue her current treatment with nivolumab and she will proceed with cycle #50 today. For the hypothyroidism she will continue on her current treatment with levothyroxine and will monitor her TSH closely. She will come back for follow-up visit in 4 weeks for evaluation before the next cycle of her treatment. The patient was advised to call immediately if she has any other concerning symptoms in the interval.  The patient voices understanding of current disease status and treatment options and is in agreement with the current care plan. All questions were answered. The patient knows to call the clinic with any problems, questions or concerns. We can certainly see the patient much sooner if necessary.  Disclaimer: This note was dictated with voice recognition software. Similar sounding words can inadvertently be transcribed and may not be corrected upon review.

## 2021-01-15 NOTE — Patient Instructions (Signed)
[facility] MEDICAL ONCOLOGY  ? Discharge Instructions: ?Thank you for choosing [facility] to provide your oncology and hematology care.  ? ?If you have a lab appointment with the Cancer Center, please go directly to the Cancer Center and check in at the registration area. ?  ?Wear comfortable clothing and clothing appropriate for easy access to any Portacath or PICC line.  ? ?We strive to give you quality time with your provider. You may need to reschedule your appointment if you arrive late (15 or more minutes).  Arriving late affects you and other patients whose appointments are after yours.  Also, if you miss three or more appointments without notifying the office, you may be dismissed from the clinic at the provider?s discretion.    ?  ?For prescription refill requests, have your pharmacy contact our office and allow 72 hours for refills to be completed.   ? ?Today you received the following chemotherapy and/or immunotherapy agents: nivolumab    ?  ?To help prevent nausea and vomiting after your treatment, we encourage you to take your nausea medication as directed. ? ?BELOW ARE SYMPTOMS THAT SHOULD BE REPORTED IMMEDIATELY: ?*FEVER GREATER THAN 100.4 F (38 ?C) OR HIGHER ?*CHILLS OR SWEATING ?*NAUSEA AND VOMITING THAT IS NOT CONTROLLED WITH YOUR NAUSEA MEDICATION ?*UNUSUAL SHORTNESS OF BREATH ?*UNUSUAL BRUISING OR BLEEDING ?*URINARY PROBLEMS (pain or burning when urinating, or frequent urination) ?*BOWEL PROBLEMS (unusual diarrhea, constipation, pain near the anus) ?TENDERNESS IN MOUTH AND THROAT WITH OR WITHOUT PRESENCE OF ULCERS (sore throat, sores in mouth, or a toothache) ?UNUSUAL RASH, SWELLING OR PAIN  ?UNUSUAL VAGINAL DISCHARGE OR ITCHING  ? ?Items with * indicate a potential emergency and should be followed up as soon as possible or go to the Emergency Department if any problems should occur. ? ?Please show the CHEMOTHERAPY ALERT CARD or IMMUNOTHERAPY ALERT CARD at check-in  to the Emergency Department and triage nurse. ? ?Should you have questions after your visit or need to cancel or reschedule your appointment, please contact [facility]  Dept: [phone]  and follow the prompts.  Office hours are 8:00 a.m. to 4:30 p.m. Monday - Friday. Please note that voicemails left after 4:00 p.m. may not be returned until the following business day.  We are closed weekends and major holidays. You have access to a nurse at all times for urgent questions. Please call the main number to the clinic Dept: [phone] and follow the prompts. ? ? ?For any non-urgent questions, you may also contact your provider using MyChart. We now offer e-Visits for anyone 18 and older to request care online for non-urgent symptoms. For details visit [email or web address]. ?  ?Also download the MyChart app! Go to the app store, search "MyChart", open the app, select [facility], and log in with your MyChart username and password. ? ?Due to Covid, a mask is required upon entering the hospital/clinic. If you do not have a mask, one will be given to you upon arrival. For doctor visits, patients may have 1 support person aged 18 or older with them. For treatment visits, patients cannot have anyone with them due to current Covid guidelines and our immunocompromised population.  ? ?

## 2021-01-15 NOTE — Progress Notes (Signed)
I spoke to Nancy Hodges today during her visit with Dr. Julien Nordmann. She is doing well.  She had a recent scan in which Dr. Julien Nordmann went over.  Her plan of care has not changed at this time and she verbalized understanding.

## 2021-01-18 ENCOUNTER — Telehealth: Payer: Self-pay | Admitting: Internal Medicine

## 2021-01-18 NOTE — Telephone Encounter (Signed)
Scheduled per los. Called and left msg. Mailed printout  °

## 2021-01-30 ENCOUNTER — Ambulatory Visit (INDEPENDENT_AMBULATORY_CARE_PROVIDER_SITE_OTHER): Payer: Medicare Other

## 2021-01-30 DIAGNOSIS — I495 Sick sinus syndrome: Secondary | ICD-10-CM | POA: Diagnosis not present

## 2021-01-30 LAB — CUP PACEART REMOTE DEVICE CHECK
Battery Remaining Longevity: 91 mo
Battery Remaining Percentage: 72 %
Battery Voltage: 3.02 V
Brady Statistic RV Percent Paced: 33 %
Date Time Interrogation Session: 20220629020014
Implantable Lead Implant Date: 20190329
Implantable Lead Implant Date: 20190329
Implantable Lead Location: 753859
Implantable Lead Location: 753860
Implantable Pulse Generator Implant Date: 20190329
Lead Channel Impedance Value: 560 Ohm
Lead Channel Pacing Threshold Amplitude: 0.75 V
Lead Channel Pacing Threshold Pulse Width: 0.5 ms
Lead Channel Sensing Intrinsic Amplitude: 12 mV
Lead Channel Setting Pacing Amplitude: 2.5 V
Lead Channel Setting Pacing Pulse Width: 0.5 ms
Lead Channel Setting Sensing Sensitivity: 2 mV
Pulse Gen Model: 2272
Pulse Gen Serial Number: 9003317

## 2021-02-12 ENCOUNTER — Inpatient Hospital Stay: Payer: Medicare Other | Attending: Internal Medicine

## 2021-02-12 ENCOUNTER — Other Ambulatory Visit: Payer: Self-pay | Admitting: Internal Medicine

## 2021-02-12 ENCOUNTER — Inpatient Hospital Stay: Payer: Medicare Other

## 2021-02-12 ENCOUNTER — Inpatient Hospital Stay (HOSPITAL_BASED_OUTPATIENT_CLINIC_OR_DEPARTMENT_OTHER): Payer: Medicare Other | Admitting: Internal Medicine

## 2021-02-12 ENCOUNTER — Other Ambulatory Visit: Payer: Self-pay

## 2021-02-12 ENCOUNTER — Encounter: Payer: Self-pay | Admitting: Internal Medicine

## 2021-02-12 VITALS — BP 140/60 | HR 66 | Temp 98.7°F | Resp 17 | Ht 62.0 in | Wt 145.6 lb

## 2021-02-12 DIAGNOSIS — R5383 Other fatigue: Secondary | ICD-10-CM

## 2021-02-12 DIAGNOSIS — Z79899 Other long term (current) drug therapy: Secondary | ICD-10-CM | POA: Insufficient documentation

## 2021-02-12 DIAGNOSIS — C3491 Malignant neoplasm of unspecified part of right bronchus or lung: Secondary | ICD-10-CM

## 2021-02-12 DIAGNOSIS — C3411 Malignant neoplasm of upper lobe, right bronchus or lung: Secondary | ICD-10-CM | POA: Insufficient documentation

## 2021-02-12 DIAGNOSIS — C773 Secondary and unspecified malignant neoplasm of axilla and upper limb lymph nodes: Secondary | ICD-10-CM | POA: Diagnosis present

## 2021-02-12 DIAGNOSIS — Z5112 Encounter for antineoplastic immunotherapy: Secondary | ICD-10-CM

## 2021-02-12 DIAGNOSIS — C3490 Malignant neoplasm of unspecified part of unspecified bronchus or lung: Secondary | ICD-10-CM

## 2021-02-12 DIAGNOSIS — Z95828 Presence of other vascular implants and grafts: Secondary | ICD-10-CM

## 2021-02-12 LAB — CBC WITH DIFFERENTIAL (CANCER CENTER ONLY)
Abs Immature Granulocytes: 0.01 10*3/uL (ref 0.00–0.07)
Basophils Absolute: 0 10*3/uL (ref 0.0–0.1)
Basophils Relative: 1 %
Eosinophils Absolute: 0.2 10*3/uL (ref 0.0–0.5)
Eosinophils Relative: 3 %
HCT: 37.9 % (ref 36.0–46.0)
Hemoglobin: 13.4 g/dL (ref 12.0–15.0)
Immature Granulocytes: 0 %
Lymphocytes Relative: 24 %
Lymphs Abs: 1.4 10*3/uL (ref 0.7–4.0)
MCH: 33.8 pg (ref 26.0–34.0)
MCHC: 35.4 g/dL (ref 30.0–36.0)
MCV: 95.5 fL (ref 80.0–100.0)
Monocytes Absolute: 0.4 10*3/uL (ref 0.1–1.0)
Monocytes Relative: 7 %
Neutro Abs: 3.6 10*3/uL (ref 1.7–7.7)
Neutrophils Relative %: 65 %
Platelet Count: 167 10*3/uL (ref 150–400)
RBC: 3.97 MIL/uL (ref 3.87–5.11)
RDW: 12.9 % (ref 11.5–15.5)
WBC Count: 5.6 10*3/uL (ref 4.0–10.5)
nRBC: 0 % (ref 0.0–0.2)

## 2021-02-12 LAB — CMP (CANCER CENTER ONLY)
ALT: 33 U/L (ref 0–44)
AST: 40 U/L (ref 15–41)
Albumin: 3.8 g/dL (ref 3.5–5.0)
Alkaline Phosphatase: 112 U/L (ref 38–126)
Anion gap: 7 (ref 5–15)
BUN: 10 mg/dL (ref 8–23)
CO2: 30 mmol/L (ref 22–32)
Calcium: 9.9 mg/dL (ref 8.9–10.3)
Chloride: 104 mmol/L (ref 98–111)
Creatinine: 0.73 mg/dL (ref 0.44–1.00)
GFR, Estimated: >60 mL/min (ref >60)
Glucose, Bld: 117 mg/dL — ABNORMAL HIGH (ref 70–99)
Potassium: 4.4 mmol/L (ref 3.5–5.1)
Sodium: 141 mmol/L (ref 135–145)
Total Bilirubin: 1.3 mg/dL — ABNORMAL HIGH (ref 0.3–1.2)
Total Protein: 7.2 g/dL (ref 6.5–8.1)

## 2021-02-12 LAB — TSH: TSH: 3.625 u[IU]/mL (ref 0.308–3.960)

## 2021-02-12 MED ORDER — SODIUM CHLORIDE 0.9% FLUSH
10.0000 mL | INTRAVENOUS | Status: DC | PRN
  Administered 2021-02-12: 10 mL via INTRAVENOUS
  Filled 2021-02-12: qty 10

## 2021-02-12 MED ORDER — SODIUM CHLORIDE 0.9 % IV SOLN
Freq: Once | INTRAVENOUS | Status: AC
  Filled 2021-02-12: qty 250

## 2021-02-12 MED ORDER — SODIUM CHLORIDE 0.9 % IV SOLN
480.0000 mg | Freq: Once | INTRAVENOUS | Status: AC
  Administered 2021-02-12: 480 mg via INTRAVENOUS
  Filled 2021-02-12: qty 48

## 2021-02-12 NOTE — Patient Instructions (Signed)
[facility]  ? Discharge Instructions: ?Thank you for choosing [facility] to provide your oncology and hematology care.  ? ?If you have a lab appointment with the Cancer Center, please go directly to the Cancer Center and check in at the registration area. ?  ?Wear comfortable clothing and clothing appropriate for easy access to any Portacath or PICC line.  ? ?We strive to give you quality time with your provider. You may need to reschedule your appointment if you arrive late (15 or more minutes).  Arriving late affects you and other patients whose appointments are after yours.  Also, if you miss three or more appointments without notifying the office, you may be dismissed from the clinic at the provider?s discretion.    ?  ?For prescription refill requests, have your pharmacy contact our office and allow 72 hours for refills to be completed.   ? ?Today you received the following chemotherapy and/or immunotherapy agents: nivolumab    ?  ?To help prevent nausea and vomiting after your treatment, we encourage you to take your nausea medication as directed. ? ?BELOW ARE SYMPTOMS THAT SHOULD BE REPORTED IMMEDIATELY: ?*FEVER GREATER THAN 100.4 F (38 ?C) OR HIGHER ?*CHILLS OR SWEATING ?*NAUSEA AND VOMITING THAT IS NOT CONTROLLED WITH YOUR NAUSEA MEDICATION ?*UNUSUAL SHORTNESS OF BREATH ?*UNUSUAL BRUISING OR BLEEDING ?*URINARY PROBLEMS (pain or burning when urinating, or frequent urination) ?*BOWEL PROBLEMS (unusual diarrhea, constipation, pain near the anus) ?TENDERNESS IN MOUTH AND THROAT WITH OR WITHOUT PRESENCE OF ULCERS (sore throat, sores in mouth, or a toothache) ?UNUSUAL RASH, SWELLING OR PAIN  ?UNUSUAL VAGINAL DISCHARGE OR ITCHING  ? ?Items with * indicate a potential emergency and should be followed up as soon as possible or go to the Emergency Department if any problems should occur. ? ?Please show the CHEMOTHERAPY ALERT CARD or IMMUNOTHERAPY ALERT CARD at check-in  to the Emergency Department and triage nurse. ? ?Should you have questions after your visit or need to cancel or reschedule your appointment, please contact [facility]  Dept: [phone]  and follow the prompts.  Office hours are 8:00 a.m. to 4:30 p.m. Monday - Friday. Please note that voicemails left after 4:00 p.m. may not be returned until the following business day.  We are closed weekends and major holidays. You have access to a nurse at all times for urgent questions. Please call the main number to the clinic Dept: [phone] and follow the prompts. ? ? ?For any non-urgent questions, you may also contact your provider using MyChart. We now offer e-Visits for anyone 18 and older to request care online for non-urgent symptoms. For details visit [email or web address]. ?  ?Also download the MyChart app! Go to the app store, search "MyChart", open the app, select [facility], and log in with your MyChart username and password. ? ?Due to Covid, a mask is required upon entering the hospital/clinic. If you do not have a mask, one will be given to you upon arrival. For doctor visits, patients may have 1 support person aged 18 or older with them. For treatment visits, patients cannot have anyone with them due to current Covid guidelines and our immunocompromised population.  ? ?

## 2021-02-12 NOTE — Progress Notes (Signed)
[facility] Telephone:([phone]   Fax:([phone]) [phone]  OFFICE PROGRESS NOTE  Harris, [name], [name] [address] [place]  [address]  DIAGNOSIS: Metastatic non-small cell lung cancer, adenocarcinoma. This was initially diagnosed as stage IB (T2a, N0, M0) in March of 2014, status post right upper lobectomy with lymph node dissection but the patient has evidence for disease recurrence in the right axilla status post resection.  PRIOR THERAPY:  1) Status post right axillary lymph node biopsy on 05/01/2014 under the care of Dr. Roxan Hockey. 2) Systemic chemotherapy with carboplatin for AUC of 5 and Alimta 500 mg/M2 every 3 weeks. First cycle 05/31/2014. Status post 6 cycles. Last dose was given 09/22/2014. 3) palliative radiotherapy to the subcarinal lymph node under the care of Dr. Sondra Come. 4) Nivolumab 240 mg IV every 2 weeks. First dose 12/23/2016. Status post 6 cycles.  CURRENT THERAPY:  Nivolumab 480 mg IV every 4 weeks. First dose 03/17/2017. Status post 50 cycles.  INTERVAL HISTORY: Nancy Hodges 75 y.o. female returns to the clinic today for follow-up visit.  The patient is feeling fine today with no concerning complaints except for mild cough and shortness of breath with exertion.  She is very active and works in her yard a lot.  She denied having any current chest pain or hemoptysis.  She denied having any nausea, vomiting, diarrhea or constipation.  She denied having any headache or visual changes.  She has no fever or chills.  She continues to tolerate her treatment with nivolumab fairly well.  The patient is here today for evaluation before starting cycle #51.  MEDICAL HISTORY: Past Medical History:  Diagnosis Date   Allergic rhinitis    Anxiety    Arthritis    Asthma    Atrial flutter ([place])    Cholelithiases 09/26/2015   Diverticulosis    Encounter for antineoplastic immunotherapy 12/09/2016   GERD (gastroesophageal reflux disease)    H/O  hiatal hernia    History of blood transfusion    History of chemotherapy    History of kidney stones    History of radiation therapy    Internal hemorrhoid    Lung cancer ([place])    a. Stage IB non-small cell carcinoma, s/p R VATS, wedge resection, RU lobectomy 10/2012.   Persistent atrial fibrillation ([facility])    a. anticoagulated with Xarelto   Pneumonia 06/2016   [facility]   Presence of permanent cardiac pacemaker    Pulmonary nodule    Tachycardia-bradycardia syndrome ([place])    a. 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    ALLERGIES:  is allergic to chlorpheniramine-dm, prednisone, septra [sulfamethoxazole-trimethoprim], vioxx [rofecoxib], molds & smuts, and penicillins.  MEDICATIONS:  Current Outpatient Medications  Medication Sig Dispense Refill   acetaminophen (TYLENOL) 500 MG tablet Take 500 mg by mouth 3 (three) times daily as needed for moderate pain or fever.      Acetaminophen-DM 1000-30 MG/30ML LIQD      albuterol (PROVENTIL HFA;VENTOLIN HFA) 108 (90 BASE) MCG/ACT inhaler Inhale 2 puffs into the lungs every 6 (six) hours as needed for wheezing or shortness of breath.      azelastine (ASTELIN) 0.1 % nasal spray Place 1 spray into both nostrils 2 (two) times daily. Use in each nostril as directed     calcium carbonate (TUMS - DOSED IN MG ELEMENTAL CALCIUM) 500 MG chewable tablet Chew 1 tablet by mouth 2 (two) times daily as needed for indigestion or heartburn.  diltiazem (CARTIA XT) 240 MG 24 hr capsule Take 1 capsule (240 mg total) by mouth daily. Please let pt know to keep upcoming appt in Aug for further refills 90 capsule 0   dimenhyDRINATE (DRAMAMINE) 50 MG tablet Take 12.5 mg by mouth every 8 (eight) hours as needed for dizziness.     ipratropium-albuterol (DUONEB) 0.5-2.5 (3) MG/3ML SOLN Take 3 mLs by nebulization every 6 (six) hours as needed (shortness of breath or wheezing).      levothyroxine (SYNTHROID) 50 MCG tablet TAKE 1 TABLET(50 MCG) BY MOUTH DAILY  BEFORE AND BREAKFAST 90 tablet 0   lidocaine-prilocaine (EMLA) cream APPLY TOPICALLY TO TO THE AFFECTED AREA AS NEEDED 30 g 4   loratadine (CLARITIN) 10 MG tablet Take 10 mg by mouth daily.     OVER THE COUNTER MEDICATION Take 5 mLs by mouth daily as needed (cough). [employer] kids cough syrup     Polyvinyl Alcohol-Povidone (REFRESH OP) Place 1 drop into both eyes daily as needed (dry eyes).     XARELTO 20 MG TABS tablet TAKE 1 TABLET(20 MG) BY MOUTH DAILY WITH SUPPER 30 tablet 5   No current facility-administered medications for this visit.   Facility-Administered Medications Ordered in Other Visits  Medication Dose Route Frequency Provider Last Rate Last Admin   sodium chloride flush (NS) 0.9 % injection 10 mL  10 mL Intravenous PRN Curt Bears, MD   10 mL at 02/12/21 1033    SURGICAL HISTORY:  Past Surgical History:  Procedure Laterality Date   CARDIOVERSION N/A 02/19/2018   Procedure: CARDIOVERSION;  Surgeon: Sanda Klein, MD;  Location: [facility] ENDOSCOPY;  Service: Cardiovascular;  Laterality: N/A;   CARDIOVERSION N/A 04/30/2018   Procedure: CARDIOVERSION;  Surgeon: Thayer Headings, MD;  Location: [facility]COPY;  Service: Cardiovascular;  Laterality: N/A;   CATARACT EXTRACTION W/PHACO Right 08/14/2016   Procedure: CATARACT EXTRACTION PHACO AND INTRAOCULAR LENS PLACEMENT RIGHT EYE CDE 10.53;  Surgeon: Tonny Branch, MD;  Location: [facility];  Service: Ophthalmology;  Laterality: Right;  right   CATARACT EXTRACTION W/PHACO Left 09/25/2016   Procedure: CATARACT EXTRACTION PHACO AND INTRAOCULAR LENS PLACEMENT (IOC);  Surgeon: Tonny Branch, MD;  Location: [facility];  Service: Ophthalmology;  Laterality: Left;  left CDE 8.81   COLONOSCOPY     [facility]: cattered sigmoid diverticula, internal hemorrhoids, sessile polyp in ascending and mid transverse colon (tubular adenoma).    colonoscopy with polypectomy  2015   ESOPHAGOGASTRODUODENOSCOPY     [facility]: bile reflux in stomach, mild gastritis, hiatal  hernia   ESOPHAGOGASTRODUODENOSCOPY N/A 01/31/2016   Procedure: ESOPHAGOGASTRODUODENOSCOPY (EGD);  Surgeon: [name], MD;  Location: [facility];  Service: Endoscopy;  Laterality: N/A;  1200   IR FLUORO GUIDE PORT INSERTION RIGHT  06/30/2017   IR US GUIDE VASC ACCESS RIGHT  06/30/2017   LOBECTOMY Right 10/27/2012   Procedure: LOBECTOMY;  Surgeon: [name], MD;  Location: [facility];  Service: Thoracic;  Laterality: Right;   RIGHT UPPER LOBECTOMY & Node Dissection   LYMPH NODE BIOPSY Right 05/01/2014   Procedure: LYMPH NODE BIOPSY, Right Axillary;  Surgeon: [name], MD;  Location: [facility];  Service: Thoracic;  Laterality: Right;   PACEMAKER IMPLANT N/A 10/30/2017   SJM Assurity MRI PPM implanted by Dr [name] once leadless pacemaker battery depleted   PARTIAL HYSTERECTOMY     PERMANENT PACEMAKER INSERTION N/A 09/14/2012   Nanostim (SJM) leadless pacemaker (LEADLESS II STUDY PATIENT) implanted by Dr [name], no longer functioning, device abandoned.  VIDEO ASSISTED THORACOSCOPY (VATS)/WEDGE RESECTION Right 10/27/2012   Procedure: VIDEO ASSISTED THORACOSCOPY (VATS),RGHT UPPER LOBE LUNG WEDGE RESECTION;  Surgeon: [name], MD;  Location: [facility];  Service: Thoracic;  Laterality: Right;    REVIEW OF SYSTEMS:  A comprehensive review of systems was negative except for: Respiratory: positive for cough and dyspnea on exertion   PHYSICAL EXAMINATION: General appearance: alert, cooperative and no distress Head: Normocephalic, without obvious abnormality, atraumatic Neck: no adenopathy, no JVD, supple, symmetrical, trachea midline and thyroid not enlarged, symmetric, no tenderness/mass/nodules Lymph nodes: Cervical, supraclavicular, and axillary nodes normal. Resp: clear to auscultation bilaterally Back: symmetric, no curvature. ROM normal. No CVA tenderness. Cardio: regular rate and rhythm, S1, S2 normal, no murmur, click, rub or gallop GI: soft, non-tender; bowel sounds  normal; no masses,  no organomegaly Extremities: extremities normal, atraumatic, no cyanosis or edema  ECOG PERFORMANCE STATUS: 1 - Symptomatic but completely ambulatory  Blood pressure 140/60, pulse 66, temperature 98.7 F (37.1 C), temperature source Tympanic, resp. rate 17, height 5\' 2"  (1.575 m), weight 145 lb 9.6 oz (66 kg), SpO2 99 %.  LABORATORY DATA: Lab Results  Component Value Date   WBC 5.8 01/14/2021   HGB 13.8 01/14/2021   HCT 38.9 01/14/2021   MCV 95.3 01/14/2021   PLT 183 01/14/2021      Chemistry      Component Value Date/Time   NA 140 01/14/2021 1135   NA 139 08/05/2017 0902   K 4.4 01/14/2021 1135   K 4.1 08/05/2017 0902   CL 104 01/14/2021 1135   CO2 28 01/14/2021 1135   CO2 28 08/05/2017 0902   BUN 8 01/14/2021 1135   BUN 10.1 08/05/2017 0902   CREATININE 0.77 01/14/2021 1135   CREATININE 0.8 08/05/2017 0902   GLU 134 (H) 02/18/2017 1410      Component Value Date/Time   CALCIUM 9.9 01/14/2021 1135   CALCIUM 9.5 08/05/2017 0902   ALKPHOS 97 01/14/2021 1135   ALKPHOS 111 08/05/2017 0902   AST 39 01/14/2021 1135   AST 33 08/05/2017 0902   ALT 26 01/14/2021 1135   ALT 34 08/05/2017 0902   BILITOT 1.7 (H) 01/14/2021 1135   BILITOT 1.39 (H) 08/05/2017 0902       RADIOGRAPHIC STUDIES: CT Chest W Contrast  Result Date: 01/14/2021 CLINICAL DATA:  Metastatic lung adenocarcinoma, status post right upper lobectomy EXAM: CT CHEST, ABDOMEN, AND PELVIS WITH CONTRAST TECHNIQUE: Multidetector CT imaging of the chest, abdomen and pelvis was performed following the standard protocol during bolus administration of intravenous contrast. CONTRAST:  166mL OMNIPAQUE IOHEXOL 300 MG/ML SOLN, additional oral enteric contrast COMPARISON:  09/21/2020 FINDINGS: CT CHEST FINDINGS Cardiovascular: Aortic atherosclerosis. Right chest port catheter. Left chest multi lead pacer. Normal heart size. Left coronary artery calcifications. No pericardial effusion. Mediastinum/Nodes:  Slight interval increase in size of subcarinal lymph node, measuring 2.1 x 1.4 cm, previously 1.8 x 1.3 cm (series 2, image 26). Thyroid gland, trachea, and esophagus demonstrate no significant findings. Lungs/Pleura: Mild centrilobular emphysema. Diffuse bilateral bronchial wall thickening. Status post right upper lobectomy. Unchanged, mild paramedian radiation fibrosis of the right lower lobe (series 4, image 53). Unchanged 3 mm nodule of the posterior left upper lobe (series 4, image 31). No pleural effusion or pneumothorax. Musculoskeletal: No chest wall mass or suspicious bone lesions identified. CT ABDOMEN PELVIS FINDINGS Hepatobiliary: Coarse, nodular cirrhotic morphology of the liver. Hepatic steatosis. No solid liver abnormality is seen. Gallstones in the gallbladder. Gallbladder wall thickening, or biliary dilatation.  Pancreas: Unremarkable. No pancreatic ductal dilatation or surrounding inflammatory changes. Spleen: Normal in size without significant abnormality. Adrenals/Urinary Tract: Adrenal glands are unremarkable. Kidneys are normal, without renal calculi, solid lesion, or hydronephrosis. Bladder is unremarkable. Stomach/Bowel: Stomach is within normal limits. Appendix appears normal. No evidence of bowel wall thickening, distention, or inflammatory changes. Descending and sigmoid diverticulosis. Vascular/Lymphatic: Aortic atherosclerosis. Unchanged prominent portacaval and gastrohepatic ligament lymph nodes. Reproductive: No mass or other abnormality. Other: No abdominal wall hernia or abnormality. No abdominopelvic ascites. Musculoskeletal: No acute or significant osseous findings. IMPRESSION: 1. Slight interval increase in size of subcarinal lymph node, measuring 2.1 x 1.4 cm, previously 1.8 x 1.3 cm. Findings are suspicious for worsened nodal metastatic disease. 2. Unchanged postoperative findings of right upper lobectomy and mild paramedian radiation fibrosis of the right lower lobe. 3.  Unchanged 3 mm nodule of the posterior left upper lobe, almost certainly incidental and benign. 4. Unchanged prominent portacaval and gastrohepatic ligament lymph nodes, likely secondary to cirrhosis, abdominal nodal metastatic disease not favored. 5. Hepatic steatosis and cirrhosis. 6. Cholelithiasis. 7. Emphysema. 8. Coronary artery disease. Aortic Atherosclerosis (ICD10-I70.0) and Emphysema (ICD10-J43.9). Electronically Signed   By: Eddie Candle M.D.   On: 01/14/2021 16:21   CT Abdomen Pelvis W Contrast  Result Date: 01/14/2021 CLINICAL DATA:  Metastatic lung adenocarcinoma, status post right upper lobectomy EXAM: CT CHEST, ABDOMEN, AND PELVIS WITH CONTRAST TECHNIQUE: Multidetector CT imaging of the chest, abdomen and pelvis was performed following the standard protocol during bolus administration of intravenous contrast. CONTRAST:  145mL OMNIPAQUE IOHEXOL 300 MG/ML SOLN, additional oral enteric contrast COMPARISON:  09/21/2020 FINDINGS: CT CHEST FINDINGS Cardiovascular: Aortic atherosclerosis. Right chest port catheter. Left chest multi lead pacer. Normal heart size. Left coronary artery calcifications. No pericardial effusion. Mediastinum/Nodes: Slight interval increase in size of subcarinal lymph node, measuring 2.1 x 1.4 cm, previously 1.8 x 1.3 cm (series 2, image 26). Thyroid gland, trachea, and esophagus demonstrate no significant findings. Lungs/Pleura: Mild centrilobular emphysema. Diffuse bilateral bronchial wall thickening. Status post right upper lobectomy. Unchanged, mild paramedian radiation fibrosis of the right lower lobe (series 4, image 53). Unchanged 3 mm nodule of the posterior left upper lobe (series 4, image 31). No pleural effusion or pneumothorax. Musculoskeletal: No chest wall mass or suspicious bone lesions identified. CT ABDOMEN PELVIS FINDINGS Hepatobiliary: Coarse, nodular cirrhotic morphology of the liver. Hepatic steatosis. No solid liver abnormality is seen. Gallstones in the  gallbladder. Gallbladder wall thickening, or biliary dilatation. Pancreas: Unremarkable. No pancreatic ductal dilatation or surrounding inflammatory changes. Spleen: Normal in size without significant abnormality. Adrenals/Urinary Tract: Adrenal glands are unremarkable. Kidneys are normal, without renal calculi, solid lesion, or hydronephrosis. Bladder is unremarkable. Stomach/Bowel: Stomach is within normal limits. Appendix appears normal. No evidence of bowel wall thickening, distention, or inflammatory changes. Descending and sigmoid diverticulosis. Vascular/Lymphatic: Aortic atherosclerosis. Unchanged prominent portacaval and gastrohepatic ligament lymph nodes. Reproductive: No mass or other abnormality. Other: No abdominal wall hernia or abnormality. No abdominopelvic ascites. Musculoskeletal: No acute or significant osseous findings. IMPRESSION: 1. Slight interval increase in size of subcarinal lymph node, measuring 2.1 x 1.4 cm, previously 1.8 x 1.3 cm. Findings are suspicious for worsened nodal metastatic disease. 2. Unchanged postoperative findings of right upper lobectomy and mild paramedian radiation fibrosis of the right lower lobe. 3. Unchanged 3 mm nodule of the posterior left upper lobe, almost certainly incidental and benign. 4. Unchanged prominent portacaval and gastrohepatic ligament lymph nodes, likely secondary to cirrhosis, abdominal nodal metastatic disease not favored. 5. Hepatic  steatosis and cirrhosis. 6. Cholelithiasis. 7. Emphysema. 8. Coronary artery disease. Aortic Atherosclerosis (ICD10-I70.0) and Emphysema (ICD10-J43.9). Electronically Signed   By: Eddie Candle M.D.   On: 01/14/2021 16:21   CUP PACEART REMOTE DEVICE CHECK  Result Date: 01/30/2021 Scheduled remote reviewed. Normal device function.  Next remote 91 days.   ASSESSMENT AND PLAN:  This is a very pleasant 75 years old white female with metastatic non-small cell lung cancer, adenocarcinoma with no actionable mutations  status post systemic chemotherapy with carboplatin and Alimta and she is currently undergoing treatment with second line immunotherapy with Nivolumab status post 6 cycles. This was followed by immunotherapy with Nivolumab 480 mg IV every 4 weeks status post 50 cycles. She continues to tolerate her treatment well with no concerning adverse effects. I recommended for the patient to proceed with cycle #51 today as planned. I will see her back for follow-up visit in 4 weeks for evaluation before the next cycle of her treatment. For the hypothyroidism she will continue on her current treatment with levothyroxine and will monitor her TSH closely. She was advised to call immediately if she has any other concerning symptoms in the interval. The patient voices understanding of current disease status and treatment options and is in agreement with the current care plan. All questions were answered. The patient knows to call the clinic with any problems, questions or concerns. We can certainly see the patient much sooner if necessary.  Disclaimer: This note was dictated with voice recognition software. Similar sounding words can inadvertently be transcribed and may not be corrected upon review.

## 2021-02-19 NOTE — Progress Notes (Signed)
Remote pacemaker transmission.   

## 2021-03-08 ENCOUNTER — Ambulatory Visit (INDEPENDENT_AMBULATORY_CARE_PROVIDER_SITE_OTHER): Payer: Medicare Other | Admitting: Internal Medicine

## 2021-03-08 ENCOUNTER — Other Ambulatory Visit: Payer: Self-pay

## 2021-03-08 VITALS — BP 130/60 | HR 60 | Ht 62.0 in | Wt 142.0 lb

## 2021-03-08 DIAGNOSIS — I495 Sick sinus syndrome: Secondary | ICD-10-CM | POA: Diagnosis not present

## 2021-03-08 LAB — CUP PACEART INCLINIC DEVICE CHECK
Battery Remaining Longevity: 92 mo
Battery Voltage: 3.02 V
Brady Statistic RA Percent Paced: 0 %
Brady Statistic RV Percent Paced: 34 %
Date Time Interrogation Session: 20220805094441
Implantable Lead Implant Date: 20190329
Implantable Lead Implant Date: 20190329
Implantable Lead Location: 753859
Implantable Lead Location: 753860
Implantable Pulse Generator Implant Date: 20190329
Lead Channel Impedance Value: 575 Ohm
Lead Channel Pacing Threshold Amplitude: 0.75 V
Lead Channel Pacing Threshold Pulse Width: 0.5 ms
Lead Channel Sensing Intrinsic Amplitude: 1.9 mV
Lead Channel Sensing Intrinsic Amplitude: 12 mV
Lead Channel Setting Pacing Amplitude: 2.5 V
Lead Channel Setting Pacing Pulse Width: 0.5 ms
Lead Channel Setting Sensing Sensitivity: 2 mV
Pulse Gen Model: 2272
Pulse Gen Serial Number: 9003317

## 2021-03-08 NOTE — Progress Notes (Signed)
PCP: [name], [name]   Primary EP:  Dr Lillette Boxer Nancy Hodges is a 75 y.o. female who presents today for routine electrophysiology followup.  Since last being seen in our clinic, the patient reports doing very well.  Today, she denies symptoms of palpitations, chest pain, shortness of breath,  lower extremity edema, dizziness, presyncope, or syncope.  The patient is otherwise without complaint today.   Past Medical History:  Diagnosis Date   Allergic rhinitis    Anxiety    Arthritis    Asthma    Atrial flutter ([place])    Cholelithiases 09/26/2015   Diverticulosis    Encounter for antineoplastic immunotherapy 12/09/2016   GERD (gastroesophageal reflux disease)    H/O hiatal hernia    History of blood transfusion    History of chemotherapy    History of kidney stones    History of radiation therapy    Internal hemorrhoid    Lung cancer ([place])    a. Stage IB non-small cell carcinoma, s/p R VATS, wedge resection, RU lobectomy 10/2012.   Persistent atrial fibrillation (H[facility])    a. anticoagulated with Xarelto   Pneumonia 06/2016   [facility]   Presence of permanent cardiac pacemaker    Pulmonary nodule    Tachycardia-bradycardia syndrome ([place])    a. 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   Past Surgical History:  Procedure Laterality Date   CARDIOVERSION N/A 02/19/2018   Procedure: CARDIOVERSION;  Surgeon: Sanda Klein, MD;  Location: [facility] ENDOSCOPY;  Service: Cardiovascular;  Laterality: N/A;   CARDIOVERSION N/A 04/30/2018   Procedure: CARDIOVERSION;  Surgeon: Thayer Headings, MD;  Location: [facility] ENDOSCOPY;  Service: Cardiovascular;  Laterality: N/A;   CATARACT EXTRACTION W/PHACO Right 08/14/2016   Procedure: CATARACT EXTRACTION PHACO AND INTRAOCULAR LENS PLACEMENT RIGHT EYE CDE 10.53;  Surgeon: Tonny Branch, MD;  Location: [facility];  Service: Ophthalmology;  Laterality: Right;  right   CATARACT EXTRACTION W/PHACO Left 09/25/2016   Procedure: CATARACT EXTRACTION PHACO  AND INTRAOCULAR LENS PLACEMENT (IOC);  Surgeon: Tonny Branch, MD;  Location: [facility];  Service: Ophthalmology;  Laterality: Left;  left CDE 8.81   COLONOSCOPY     [facility]: cattered sigmoid diverticula, internal hemorrhoids, sessile polyp in ascending and mid transverse colon (tubular adenoma).    colonoscopy with polypectomy  2015   ESOPHAGOGASTRODUODENOSCOPY     [facility]: bile reflux in stomach, mild gastritis, hiatal hernia   ESOPHAGOGASTRODUODENOSCOPY N/A 01/31/2016   Procedure: ESOPHAGOGASTRODUODENOSCOPY (EGD);  Surgeon: [name], MD;  [facility];  Service: Endoscopy;  Laterality: N/A;  1200   IR FLUORO GUIDE PORT INSERTION RIGHT  06/30/2017   IR US GUIDE VASC ACCESS RIGHT  06/30/2017   LOBECTOMY Right 10/27/2012   Procedure: LOBECTOMY;  Surgeon: [name], MD;  Location: [facility];  Service: Thoracic;  Laterality: Right;   RIGHT UPPER LOBECTOMY & Node Dissection   LYMPH NODE BIOPSY Right 05/01/2014   Procedure: LYMPH NODE BIOPSY, Right Axillary;  Surgeon: [name], MD;  Location: [facility];  Service: Thoracic;  Laterality: Right;   PACEMAKER IMPLANT N/A 10/30/2017   SJM Assurity MRI PPM implanted by Dr [name] once leadless pacemaker battery depleted   PARTIAL HYSTERECTOMY     PERMANENT PACEMAKER INSERTION N/A 09/14/2012   Nanostim (SJM) leadless pacemaker (LEADLESS II STUDY PATIENT) implanted by Dr [name], no longer functioning, device abandoned.   VIDEO ASSISTED THORACOSCOPY (VATS)/WEDGE RESECTION Right 10/27/2012   Procedure: VIDEO ASSISTED THORACOSCOPY (VATS),RGHT UPPER LOBE LUNG WEDGE  RESECTION;  Surgeon: [name], MD;  Location: [facility];  Service: Thoracic;  Laterality: Right;    ROS- all systems are reviewed and negative except as per HPI above  Current Outpatient Medications  Medication Sig Dispense Refill   acetaminophen (TYLENOL) 500 MG tablet Take 500 mg by mouth 3 (three) times daily as needed for moderate pain or fever.       Acetaminophen-DM 1000-30 MG/30ML LIQD      albuterol (PROVENTIL HFA;VENTOLIN HFA) 108 (90 BASE) MCG/ACT inhaler Inhale 2 puffs into the lungs every 6 (six) hours as needed for wheezing or shortness of breath.      azelastine (ASTELIN) 0.1 % nasal spray Place 1 spray into both nostrils 2 (two) times daily. Use in each nostril as directed     calcium carbonate (TUMS - DOSED IN MG ELEMENTAL CALCIUM) 500 MG chewable tablet Chew 1 tablet by mouth 2 (two) times daily as needed for indigestion or heartburn.      diltiazem (CARTIA XT) 240 MG 24 hr capsule Take 1 capsule (240 mg total) by mouth daily. Please let pt know to keep upcoming appt in Aug for further refills 90 capsule 0   dimenhyDRINATE (DRAMAMINE) 50 MG tablet Take 12.5 mg by mouth every 8 (eight) hours as needed for dizziness.     ipratropium-albuterol (DUONEB) 0.5-2.5 (3) MG/3ML SOLN Take 3 mLs by nebulization every 6 (six) hours as needed (shortness of breath or wheezing).      levothyroxine (SYNTHROID) 50 MCG tablet TAKE 1 TABLET(50 MCG) BY MOUTH DAILY BEFORE AND BREAKFAST 90 tablet 0   lidocaine-prilocaine (EMLA) cream APPLY TOPICALLY TO TO THE AFFECTED AREA AS NEEDED 30 g 4   loratadine (CLARITIN) 10 MG tablet Take 10 mg by mouth daily.     OVER THE COUNTER MEDICATION Take 5 mLs by mouth daily as needed (cough). H[employer]'s kids cough syrup     Polyvinyl Alcohol-Povidone (REFRESH OP) Place 1 drop into both eyes daily as needed (dry eyes).     XARELTO 20 MG TABS tablet TAKE 1 TABLET(20 MG) BY MOUTH DAILY WITH SUPPER 30 tablet 5   No current facility-administered medications for this visit.    Physical Exam: Vitals:   03/08/21 0938  BP: 130/60  Pulse: 60  SpO2: 98%  Weight: 142 lb (64.4 kg)  Height: 5\' 2"  (1.575 m)    GEN- The patient is well appearing, alert and oriented x 3 today.   Head- normocephalic, atraumatic Eyes-  Sclera clear, conjunctiva pink Ears- hearing intact Oropharynx- clear Lungs- Clear to ausculation  bilaterally, normal work of breathing Chest- pacemaker pocket is well healed Heart- Regular rate and rhythm, no murmurs, rubs or gallops, PMI not laterally displaced GI- soft, NT, ND, + BS Extremities- no clubbing, cyanosis, or edema  Pacemaker interrogation- reviewed in detail today,  See PACEART report  ekg tracing ordered today is personally reviewed and shows afib, demand V pacing  Assessment and Plan:  1. Symptomatic sinus bradycardia   Normal pacemaker function See Pace Art report No changes today she is not device dependant today V paces 34%     2. Permanent afib Rate controlled She is on xarelto for stroke prevention  Chads2vasc score is 5 CrCl is 51.  Continue xarelto 20mg  daily  Return in a year  [name] MD, [facility] 03/08/2021 9:57 AM

## 2021-03-08 NOTE — Patient Instructions (Signed)
Medication Instructions:  Continue all current medications.  Labwork: none  Testing/Procedures: none  Follow-Up: 1 year   Any Other Special Instructions Will Be Listed Below (If Applicable).  If you need a refill on your cardiac medications before your next appointment, please call your pharmacy.  

## 2021-03-12 ENCOUNTER — Other Ambulatory Visit: Payer: Self-pay

## 2021-03-12 ENCOUNTER — Inpatient Hospital Stay: Payer: Medicare Other

## 2021-03-12 ENCOUNTER — Inpatient Hospital Stay: Payer: Medicare Other | Attending: Internal Medicine | Admitting: Internal Medicine

## 2021-03-12 VITALS — BP 133/74 | HR 67 | Temp 97.8°F | Resp 17 | Wt 144.5 lb

## 2021-03-12 DIAGNOSIS — R5383 Other fatigue: Secondary | ICD-10-CM

## 2021-03-12 DIAGNOSIS — C3411 Malignant neoplasm of upper lobe, right bronchus or lung: Secondary | ICD-10-CM | POA: Diagnosis present

## 2021-03-12 DIAGNOSIS — Z79899 Other long term (current) drug therapy: Secondary | ICD-10-CM | POA: Diagnosis not present

## 2021-03-12 DIAGNOSIS — C3491 Malignant neoplasm of unspecified part of right bronchus or lung: Secondary | ICD-10-CM | POA: Diagnosis not present

## 2021-03-12 DIAGNOSIS — Z5112 Encounter for antineoplastic immunotherapy: Secondary | ICD-10-CM | POA: Diagnosis present

## 2021-03-12 DIAGNOSIS — C773 Secondary and unspecified malignant neoplasm of axilla and upper limb lymph nodes: Secondary | ICD-10-CM | POA: Diagnosis present

## 2021-03-12 DIAGNOSIS — C3490 Malignant neoplasm of unspecified part of unspecified bronchus or lung: Secondary | ICD-10-CM

## 2021-03-12 DIAGNOSIS — Z95828 Presence of other vascular implants and grafts: Secondary | ICD-10-CM

## 2021-03-12 LAB — CBC WITH DIFFERENTIAL (CANCER CENTER ONLY)
Abs Immature Granulocytes: 0.01 10*3/uL (ref 0.00–0.07)
Basophils Absolute: 0 10*3/uL (ref 0.0–0.1)
Basophils Relative: 1 %
Eosinophils Absolute: 0.2 10*3/uL (ref 0.0–0.5)
Eosinophils Relative: 3 %
HCT: 38.4 % (ref 36.0–46.0)
Hemoglobin: 13.6 g/dL (ref 12.0–15.0)
Immature Granulocytes: 0 %
Lymphocytes Relative: 21 %
Lymphs Abs: 1.3 10*3/uL (ref 0.7–4.0)
MCH: 34.3 pg — ABNORMAL HIGH (ref 26.0–34.0)
MCHC: 35.4 g/dL (ref 30.0–36.0)
MCV: 97 fL (ref 80.0–100.0)
Monocytes Absolute: 0.4 10*3/uL (ref 0.1–1.0)
Monocytes Relative: 7 %
Neutro Abs: 4.2 10*3/uL (ref 1.7–7.7)
Neutrophils Relative %: 68 %
Platelet Count: 178 10*3/uL (ref 150–400)
RBC: 3.96 MIL/uL (ref 3.87–5.11)
RDW: 12.8 % (ref 11.5–15.5)
WBC Count: 6.2 10*3/uL (ref 4.0–10.5)
nRBC: 0 % (ref 0.0–0.2)

## 2021-03-12 LAB — TSH: TSH: 0.607 u[IU]/mL (ref 0.308–3.960)

## 2021-03-12 LAB — CMP (CANCER CENTER ONLY)
ALT: 36 U/L (ref 0–44)
AST: 44 U/L — ABNORMAL HIGH (ref 15–41)
Albumin: 4 g/dL (ref 3.5–5.0)
Alkaline Phosphatase: 103 U/L (ref 38–126)
Anion gap: 7 (ref 5–15)
BUN: 10 mg/dL (ref 8–23)
CO2: 28 mmol/L (ref 22–32)
Calcium: 9.5 mg/dL (ref 8.9–10.3)
Chloride: 106 mmol/L (ref 98–111)
Creatinine: 0.72 mg/dL (ref 0.44–1.00)
GFR, Estimated: >60 mL/min (ref >60)
Glucose, Bld: 114 mg/dL — ABNORMAL HIGH (ref 70–99)
Potassium: 4.2 mmol/L (ref 3.5–5.1)
Sodium: 141 mmol/L (ref 135–145)
Total Bilirubin: 1.4 mg/dL — ABNORMAL HIGH (ref 0.3–1.2)
Total Protein: 7.2 g/dL (ref 6.5–8.1)

## 2021-03-12 MED ORDER — SODIUM CHLORIDE 0.9% FLUSH
10.0000 mL | INTRAVENOUS | Status: DC | PRN
  Administered 2021-03-12: 10 mL
  Filled 2021-03-12: qty 10

## 2021-03-12 MED ORDER — SODIUM CHLORIDE 0.9 % IV SOLN
Freq: Once | INTRAVENOUS | Status: AC
  Filled 2021-03-12: qty 250

## 2021-03-12 MED ORDER — SODIUM CHLORIDE 0.9% FLUSH
10.0000 mL | INTRAVENOUS | Status: DC | PRN
  Administered 2021-03-12: 10 mL via INTRAVENOUS
  Filled 2021-03-12: qty 10

## 2021-03-12 MED ORDER — HEPARIN SOD (PORK) LOCK FLUSH 100 UNIT/ML IV SOLN
500.0000 [IU] | Freq: Once | INTRAVENOUS | Status: AC | PRN
  Administered 2021-03-12: 500 [IU]
  Filled 2021-03-12: qty 5

## 2021-03-12 MED ORDER — SODIUM CHLORIDE 0.9 % IV SOLN
480.0000 mg | Freq: Once | INTRAVENOUS | Status: AC
  Administered 2021-03-12: 480 mg via INTRAVENOUS
  Filled 2021-03-12: qty 48

## 2021-03-12 NOTE — Patient Instructions (Signed)
[facility]  ? Discharge Instructions: ?Thank you for choosing [facility] to provide your oncology and hematology care.  ? ?If you have a lab appointment with the Cancer Center, please go directly to the Cancer Center and check in at the registration area. ?  ?Wear comfortable clothing and clothing appropriate for easy access to any Portacath or PICC line.  ? ?We strive to give you quality time with your provider. You may need to reschedule your appointment if you arrive late (15 or more minutes).  Arriving late affects you and other patients whose appointments are after yours.  Also, if you miss three or more appointments without notifying the office, you may be dismissed from the clinic at the provider?s discretion.    ?  ?For prescription refill requests, have your pharmacy contact our office and allow 72 hours for refills to be completed.   ? ?Today you received the following chemotherapy and/or immunotherapy agents: nivolumab    ?  ?To help prevent nausea and vomiting after your treatment, we encourage you to take your nausea medication as directed. ? ?BELOW ARE SYMPTOMS THAT SHOULD BE REPORTED IMMEDIATELY: ?*FEVER GREATER THAN 100.4 F (38 ?C) OR HIGHER ?*CHILLS OR SWEATING ?*NAUSEA AND VOMITING THAT IS NOT CONTROLLED WITH YOUR NAUSEA MEDICATION ?*UNUSUAL SHORTNESS OF BREATH ?*UNUSUAL BRUISING OR BLEEDING ?*URINARY PROBLEMS (pain or burning when urinating, or frequent urination) ?*BOWEL PROBLEMS (unusual diarrhea, constipation, pain near the anus) ?TENDERNESS IN MOUTH AND THROAT WITH OR WITHOUT PRESENCE OF ULCERS (sore throat, sores in mouth, or a toothache) ?UNUSUAL RASH, SWELLING OR PAIN  ?UNUSUAL VAGINAL DISCHARGE OR ITCHING  ? ?Items with * indicate a potential emergency and should be followed up as soon as possible or go to the Emergency Department if any problems should occur. ? ?Please show the CHEMOTHERAPY ALERT CARD or IMMUNOTHERAPY ALERT CARD at check-in  to the Emergency Department and triage nurse. ? ?Should you have questions after your visit or need to cancel or reschedule your appointment, please contact [facility]  Dept: [phone]  and follow the prompts.  Office hours are 8:00 a.m. to 4:30 p.m. Monday - Friday. Please note that voicemails left after 4:00 p.m. may not be returned until the following business day.  We are closed weekends and major holidays. You have access to a nurse at all times for urgent questions. Please call the main number to the clinic Dept: [phone] and follow the prompts. ? ? ?For any non-urgent questions, you may also contact your provider using MyChart. We now offer e-Visits for anyone 18 and older to request care online for non-urgent symptoms. For details visit [email or web address]. ?  ?Also download the MyChart app! Go to the app store, search "MyChart", open the app, select [facility] , and log in with your MyChart username and password. ? ?Due to Covid, a mask is required upon entering the hospital/clinic. If you do not have a mask, one will be given to you upon arrival. For doctor visits, patients may have 1 support person aged 18 or older with them. For treatment visits, patients cannot have anyone with them due to current Covid guidelines and our immunocompromised population.  ? ?

## 2021-03-12 NOTE — Progress Notes (Signed)
[facility] [phone]   [phone]  OFFICE PROGRESS NOTE  Harris, [name], [name] [address] [place] [address]  DIAGNOSIS: Metastatic non-small cell lung cancer, adenocarcinoma. This was initially diagnosed as stage IB (T2a, N0, M0) in March of 2014, status post right upper lobectomy with lymph node dissection but the patient has evidence for disease recurrence in the right axilla status post resection.  PRIOR THERAPY:  1) Status post right axillary lymph node biopsy on 05/01/2014 under the care of Dr. Roxan Hockey. 2) Systemic chemotherapy with carboplatin for AUC of 5 and Alimta 500 mg/M2 every 3 weeks. First cycle 05/31/2014. Status post 6 cycles. Last dose was given 09/22/2014. 3) palliative radiotherapy to the subcarinal lymph node under the care of Dr. Sondra Come. 4) Nivolumab 240 mg IV every 2 weeks. First dose 12/23/2016. Status post 6 cycles.  CURRENT THERAPY:  Nivolumab 480 mg IV every 4 weeks. First dose 03/17/2017. Status post 51 cycles.  INTERVAL HISTORY: Nancy Hodges 75 y.o. female returns to the clinic today for follow-up visit.  The patient is feeling fine today with no concerning complaints she is still very active and doing a lot of yard work.  She denied having any current chest pain, shortness of breath, cough or hemoptysis.  She denied having any fever or chills.  She has no nausea, vomiting, diarrhea or constipation.  She has no headache or visual changes.  She is here today for evaluation before starting cycle #52 of her treatment.  MEDICAL HISTORY: Past Medical History:  Diagnosis Date   Allergic rhinitis    Anxiety    Arthritis    Asthma    Atrial flutter ([place])    Cholelithiases 09/26/2015   Diverticulosis    Encounter for antineoplastic immunotherapy 12/09/2016   GERD (gastroesophageal reflux disease)    H/O hiatal hernia    History of blood transfusion    History of chemotherapy    History of kidney  stones    History of radiation therapy    Internal hemorrhoid    Lung cancer ([place])    a. Stage IB non-small cell carcinoma, s/p R VATS, wedge resection, RU lobectomy 10/2012.   Persistent atrial fibrillation ([facility])    a. anticoagulated with Xarelto   Pneumonia 06/2016   [facility]   Presence of permanent cardiac pacemaker    Pulmonary nodule    Tachycardia-bradycardia syndrome ([place])    a. 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    ALLERGIES:  is allergic to chlorpheniramine-dm, prednisone, septra [sulfamethoxazole-trimethoprim], vioxx [rofecoxib], molds & smuts, and penicillins.  MEDICATIONS:  Current Outpatient Medications  Medication Sig Dispense Refill   acetaminophen (TYLENOL) 500 MG tablet Take 500 mg by mouth 3 (three) times daily as needed for moderate pain or fever.      Acetaminophen-DM 1000-30 MG/30ML LIQD      albuterol (PROVENTIL HFA;VENTOLIN HFA) 108 (90 BASE) MCG/ACT inhaler Inhale 2 puffs into the lungs every 6 (six) hours as needed for wheezing or shortness of breath.      azelastine (ASTELIN) 0.1 % nasal spray Place 1 spray into both nostrils 2 (two) times daily. Use in each nostril as directed     calcium carbonate (TUMS - DOSED IN MG ELEMENTAL CALCIUM) 500 MG chewable tablet Chew 1 tablet by mouth 2 (two) times daily as needed for indigestion or heartburn.      diltiazem (CARTIA XT) 240 MG 24 hr capsule Take 1 capsule (240 mg total)  by mouth daily. Please let pt know to keep upcoming appt in Aug for further refills 90 capsule 0   dimenhyDRINATE (DRAMAMINE) 50 MG tablet Take 12.5 mg by mouth every 8 (eight) hours as needed for dizziness.     ipratropium-albuterol (DUONEB) 0.5-2.5 (3) MG/3ML SOLN Take 3 mLs by nebulization every 6 (six) hours as needed (shortness of breath or wheezing).      levothyroxine (SYNTHROID) 50 MCG tablet TAKE 1 TABLET(50 MCG) BY MOUTH DAILY BEFORE AND BREAKFAST 90 tablet 0   lidocaine-prilocaine (EMLA) cream APPLY TOPICALLY TO TO THE  AFFECTED AREA AS NEEDED 30 g 4   loratadine (CLARITIN) 10 MG tablet Take 10 mg by mouth daily.     OVER THE COUNTER MEDICATION Take 5 mLs by mouth daily as needed (cough). [employer]'s kids cough syrup     Polyvinyl Alcohol-Povidone (REFRESH OP) Place 1 drop into both eyes daily as needed (dry eyes).     XARELTO 20 MG TABS tablet TAKE 1 TABLET(20 MG) BY MOUTH DAILY WITH SUPPER 30 tablet 5   No current facility-administered medications for this visit.   Facility-Administered Medications Ordered in Other Visits  Medication Dose Route Frequency Provider Last Rate Last Admin   sodium chloride flush (NS) 0.9 % injection 10 mL  10 mL Intravenous PRN Curt Bears, MD   10 mL at 03/12/21 1041    SURGICAL HISTORY:  Past Surgical History:  Procedure Laterality Date   CARDIOVERSION N/A 02/19/2018   Procedure: CARDIOVERSION;  Surgeon: Sanda Klein, MD;  Location: [facility] ENDOSCOPY;  Service: Cardiovascular;  Laterality: N/A;   CARDIOVERSION N/A 04/30/2018   Procedure: CARDIOVERSION;  Surgeon: Thayer Headings, MD;  Location: [facility] ENDOSCOPY;  Service: Cardiovascular;  Laterality: N/A;   CATARACT EXTRACTION W/PHACO Right 08/14/2016   Procedure: CATARACT EXTRACTION PHACO AND INTRAOCULAR LENS PLACEMENT RIGHT EYE CDE 10.53;  Surgeon: Tonny Branch, MD;  [facility];  Service: Ophthalmology;  Laterality: Right;  right   CATARACT EXTRACTION W/PHACO Left 09/25/2016   Procedure: CATARACT EXTRACTION PHACO AND INTRAOCULAR LENS PLACEMENT (IOC);  Surgeon: Tonny Branch, MD;  [facility];  Service: Ophthalmology;  Laterality: Left;  left CDE 8.81   COLONOSCOPY     [facility]: cattered sigmoid diverticula, internal hemorrhoids, sessile polyp in ascending and mid transverse colon (tubular adenoma).    colonoscopy with polypectomy  2015   ESOPHAGOGASTRODUODENOSCOPY     [facility]: bile reflux in stomach, mild gastritis, hiatal hernia   ESOPHAGOGASTRODUODENOSCOPY N/A 01/31/2016   Procedure: ESOPHAGOGASTRODUODENOSCOPY (EGD);   Surgeon: [name], MD;  [facility];  Service: Endoscopy;  Laterality: N/A;  1200   IR FLUORO GUIDE PORT INSERTION RIGHT  06/30/2017   IR US GUIDE VASC ACCESS RIGHT  06/30/2017   LOBECTOMY Right 10/27/2012   Procedure: LOBECTOMY;  Surgeon: [name], MD;  Location: [facility];  Service: Thoracic;  Laterality: Right;   RIGHT UPPER LOBECTOMY & Node Dissection   LYMPH NODE BIOPSY Right 05/01/2014   Procedure: LYMPH NODE BIOPSY, Right Axillary;  Surgeon: [name], MD;  Location: [facility];  Service: Thoracic;  Laterality: Right;   PACEMAKER IMPLANT N/A 10/30/2017   SJM Assurity MRI PPM implanted by Dr [name] once leadless pacemaker battery depleted   PARTIAL HYSTERECTOMY     PERMANENT PACEMAKER INSERTION N/A 09/14/2012   Nanostim (SJM) leadless pacemaker (LEADLESS II STUDY PATIENT) implanted by Dr [name], no longer functioning, device abandoned.   VIDEO ASSISTED THORACOSCOPY (VATS)/WEDGE RESECTION Right 10/27/2012   Procedure: VIDEO ASSISTED THORACOSCOPY (VATS),RGHT  UPPER LOBE LUNG WEDGE RESECTION;  Surgeon: [name], MD;  Location: [facility];  Service: Thoracic;  Laterality: Right;    REVIEW OF SYSTEMS:  A comprehensive review of systems was negative.   PHYSICAL EXAMINATION: General appearance: alert, cooperative and no distress Head: Normocephalic, without obvious abnormality, atraumatic Neck: no adenopathy, no JVD, supple, symmetrical, trachea midline and thyroid not enlarged, symmetric, no tenderness/mass/nodules Lymph nodes: Cervical, supraclavicular, and axillary nodes normal. Resp: clear to auscultation bilaterally Back: symmetric, no curvature. ROM normal. No CVA tenderness. Cardio: regular rate and rhythm, S1, S2 normal, no murmur, click, rub or gallop GI: soft, non-tender; bowel sounds normal; no masses,  no organomegaly Extremities: extremities normal, atraumatic, no cyanosis or edema  ECOG PERFORMANCE STATUS: 1 - Symptomatic but completely  ambulatory  Blood pressure 133/74, pulse 67, temperature 97.8 F (36.6 C), temperature source Oral, resp. rate 17, weight 144 lb 8 oz (65.5 kg), SpO2 96 %.  LABORATORY DATA: Lab Results  Component Value Date   WBC 6.2 03/12/2021   HGB 13.6 03/12/2021   HCT 38.4 03/12/2021   MCV 97.0 03/12/2021   PLT 178 03/12/2021      Chemistry      Component Value Date/Time   NA 141 02/12/2021 1034   NA 139 08/05/2017 0902   K 4.4 02/12/2021 1034   K 4.1 08/05/2017 0902   CL 104 02/12/2021 1034   CO2 30 02/12/2021 1034   CO2 28 08/05/2017 0902   BUN 10 02/12/2021 1034   BUN 10.1 08/05/2017 0902   CREATININE 0.73 02/12/2021 1034   CREATININE 0.8 08/05/2017 0902   GLU 134 (H) 02/18/2017 1410      Component Value Date/Time   CALCIUM 9.9 02/12/2021 1034   CALCIUM 9.5 08/05/2017 0902   ALKPHOS 112 02/12/2021 1034   ALKPHOS 111 08/05/2017 0902   AST 40 02/12/2021 1034   AST 33 08/05/2017 0902   ALT 33 02/12/2021 1034   ALT 34 08/05/2017 0902   BILITOT 1.3 (H) 02/12/2021 1034   BILITOT 1.39 (H) 08/05/2017 0902       RADIOGRAPHIC STUDIES: CUP PACEART INCLINIC DEVICE CHECK  Result Date: 03/08/2021 Pacemaker check in clinic. Normal device function. Patient is NOT dependent today. Thresholds, sensing, impedances consistent with previous measurements. Device programmed to maximize longevity. No high ventricular rates noted. Device programmed at appropriate safety margins. Histogram distribution appropriate for patient activity level. Device programmed to optimize intrinsic conduction. Estimated longevity 7.5 years. Patient enrolled in remote follow-up 05/15/21. Patient education completed.Lavenia Atlas, BSN, RN   ASSESSMENT AND PLAN:  This is a very pleasant 75 years old white female with metastatic non-small cell lung cancer, adenocarcinoma with no actionable mutations status post systemic chemotherapy with carboplatin and Alimta and she is currently undergoing treatment with second line  immunotherapy with Nivolumab status post 6 cycles. This was followed by immunotherapy with Nivolumab 480 mg IV every 4 weeks status post 51 cycles. The patient continues to tolerate her treatment fairly well with no concerning adverse effects. I recommended for her to proceed with cycle #52 today as planned. For the cardiac arrhythmia, she is followed by Dr. [name]. For the hypothyroidism she will continue on her current treatment with levothyroxine and will monitor her TSH closely. I will see her back for follow-up visit in 4 weeks for evaluation before the next cycle of her treatment. She was advised to call immediately if she has any concerning symptoms in the interval. The patient voices understanding of current disease status  and treatment options and is in agreement with the current care plan. All questions were answered. The patient knows to call the clinic with any problems, questions or concerns. We can certainly see the patient much sooner if necessary.  Disclaimer: This note was dictated with voice recognition software. Similar sounding words can inadvertently be transcribed and may not be corrected upon review.

## 2021-04-06 ENCOUNTER — Other Ambulatory Visit: Payer: Self-pay | Admitting: Internal Medicine

## 2021-04-09 ENCOUNTER — Other Ambulatory Visit: Payer: Self-pay

## 2021-04-09 ENCOUNTER — Inpatient Hospital Stay: Payer: Medicare Other | Attending: Internal Medicine | Admitting: Internal Medicine

## 2021-04-09 ENCOUNTER — Encounter: Payer: Self-pay | Admitting: Internal Medicine

## 2021-04-09 ENCOUNTER — Inpatient Hospital Stay: Payer: Medicare Other

## 2021-04-09 ENCOUNTER — Other Ambulatory Visit: Payer: Self-pay | Admitting: Internal Medicine

## 2021-04-09 VITALS — BP 132/59 | HR 59 | Temp 96.2°F | Resp 18 | Wt 147.0 lb

## 2021-04-09 DIAGNOSIS — C773 Secondary and unspecified malignant neoplasm of axilla and upper limb lymph nodes: Secondary | ICD-10-CM | POA: Diagnosis present

## 2021-04-09 DIAGNOSIS — C3491 Malignant neoplasm of unspecified part of right bronchus or lung: Secondary | ICD-10-CM

## 2021-04-09 DIAGNOSIS — Z95828 Presence of other vascular implants and grafts: Secondary | ICD-10-CM

## 2021-04-09 DIAGNOSIS — Z5112 Encounter for antineoplastic immunotherapy: Secondary | ICD-10-CM | POA: Insufficient documentation

## 2021-04-09 DIAGNOSIS — Z79899 Other long term (current) drug therapy: Secondary | ICD-10-CM | POA: Insufficient documentation

## 2021-04-09 DIAGNOSIS — C3490 Malignant neoplasm of unspecified part of unspecified bronchus or lung: Secondary | ICD-10-CM

## 2021-04-09 DIAGNOSIS — C3411 Malignant neoplasm of upper lobe, right bronchus or lung: Secondary | ICD-10-CM | POA: Diagnosis present

## 2021-04-09 DIAGNOSIS — R5383 Other fatigue: Secondary | ICD-10-CM

## 2021-04-09 LAB — CBC WITH DIFFERENTIAL (CANCER CENTER ONLY)
Abs Immature Granulocytes: 0.01 10*3/uL (ref 0.00–0.07)
Basophils Absolute: 0.1 10*3/uL (ref 0.0–0.1)
Basophils Relative: 1 %
Eosinophils Absolute: 0.2 10*3/uL (ref 0.0–0.5)
Eosinophils Relative: 4 %
HCT: 38.3 % (ref 36.0–46.0)
Hemoglobin: 13.5 g/dL (ref 12.0–15.0)
Immature Granulocytes: 0 %
Lymphocytes Relative: 23 %
Lymphs Abs: 1.4 10*3/uL (ref 0.7–4.0)
MCH: 33.9 pg (ref 26.0–34.0)
MCHC: 35.2 g/dL (ref 30.0–36.0)
MCV: 96.2 fL (ref 80.0–100.0)
Monocytes Absolute: 0.5 10*3/uL (ref 0.1–1.0)
Monocytes Relative: 8 %
Neutro Abs: 3.8 10*3/uL (ref 1.7–7.7)
Neutrophils Relative %: 64 %
Platelet Count: 189 10*3/uL (ref 150–400)
RBC: 3.98 MIL/uL (ref 3.87–5.11)
RDW: 12.8 % (ref 11.5–15.5)
WBC Count: 5.9 10*3/uL (ref 4.0–10.5)
nRBC: 0 % (ref 0.0–0.2)

## 2021-04-09 LAB — CMP (CANCER CENTER ONLY)
ALT: 35 U/L (ref 0–44)
AST: 43 U/L — ABNORMAL HIGH (ref 15–41)
Albumin: 4 g/dL (ref 3.5–5.0)
Alkaline Phosphatase: 118 U/L (ref 38–126)
Anion gap: 9 (ref 5–15)
BUN: 8 mg/dL (ref 8–23)
CO2: 26 mmol/L (ref 22–32)
Calcium: 9.6 mg/dL (ref 8.9–10.3)
Chloride: 105 mmol/L (ref 98–111)
Creatinine: 0.77 mg/dL (ref 0.44–1.00)
GFR, Estimated: >60 mL/min (ref >60)
Glucose, Bld: 120 mg/dL — ABNORMAL HIGH (ref 70–99)
Potassium: 4.5 mmol/L (ref 3.5–5.1)
Sodium: 140 mmol/L (ref 135–145)
Total Bilirubin: 1.1 mg/dL (ref 0.3–1.2)
Total Protein: 7.4 g/dL (ref 6.5–8.1)

## 2021-04-09 LAB — TSH: TSH: 1.424 u[IU]/mL (ref 0.308–3.960)

## 2021-04-09 MED ORDER — HEPARIN SOD (PORK) LOCK FLUSH 100 UNIT/ML IV SOLN
500.0000 [IU] | Freq: Once | INTRAVENOUS | Status: AC | PRN
  Administered 2021-04-09: 500 [IU]

## 2021-04-09 MED ORDER — SODIUM CHLORIDE 0.9 % IV SOLN: Freq: Once | INTRAVENOUS | Status: AC

## 2021-04-09 MED ORDER — SODIUM CHLORIDE 0.9% FLUSH
10.0000 mL | INTRAVENOUS | Status: DC | PRN
  Administered 2021-04-09: 10 mL

## 2021-04-09 MED ORDER — SODIUM CHLORIDE 0.9% FLUSH
10.0000 mL | INTRAVENOUS | Status: DC | PRN
  Administered 2021-04-09: 10 mL via INTRAVENOUS

## 2021-04-09 MED ORDER — SODIUM CHLORIDE 0.9 % IV SOLN
480.0000 mg | Freq: Once | INTRAVENOUS | Status: AC
  Administered 2021-04-09: 480 mg via INTRAVENOUS
  Filled 2021-04-09: qty 48

## 2021-04-09 NOTE — Patient Instructions (Signed)
[facility]  Discharge Instructions: Thank you for choosing [facility] to provide your oncology and hematology care.   If you have a lab appointment with the [facility], please go directly to the [facility] and check in at the registration area.   Wear comfortable clothing and clothing appropriate for easy access to any Portacath or PICC line.   We strive to give you quality time with your provider. You may need to reschedule your appointment if you arrive late (15 or more minutes).  Arriving late affects you and other patients whose appointments are after yours.  Also, if you miss three or more appointments without notifying the office, you may be dismissed from the clinic at the provider's discretion.      For prescription refill requests, have your pharmacy contact our office and allow 72 hours for refills to be completed.    Today you received the following chemotherapy and/or immunotherapy agents opdivo      To help prevent nausea and vomiting after your treatment, we encourage you to take your nausea medication as directed.  BELOW ARE SYMPTOMS THAT SHOULD BE REPORTED IMMEDIATELY: *FEVER GREATER THAN 100.4 F (38 C) OR HIGHER *CHILLS OR SWEATING *NAUSEA AND VOMITING THAT IS NOT CONTROLLED WITH YOUR NAUSEA MEDICATION *UNUSUAL SHORTNESS OF BREATH *UNUSUAL BRUISING OR BLEEDING *URINARY PROBLEMS (pain or burning when urinating, or frequent urination) *BOWEL PROBLEMS (unusual diarrhea, constipation, pain near the anus) TENDERNESS IN MOUTH AND THROAT WITH OR WITHOUT PRESENCE OF ULCERS (sore throat, sores in mouth, or a toothache) UNUSUAL RASH, SWELLING OR PAIN  UNUSUAL VAGINAL DISCHARGE OR ITCHING   Items with * indicate a potential emergency and should be followed up as soon as possible or go to the Emergency Department if any problems should occur.  Please show the CHEMOTHERAPY ALERT CARD or IMMUNOTHERAPY ALERT CARD at check-in to the  Emergency Department and triage nurse.  Should you have questions after your visit or need to cancel or reschedule your appointment, please contact [facility]  Dept: [phone]  and follow the prompts.  Office hours are 8:00 a.m. to 4:30 p.m. Monday - Friday. Please note that voicemails left after 4:00 p.m. may not be returned until the following business day.  We are closed weekends and major holidays. You have access to a nurse at all times for urgent questions. Please call the main number to the clinic Dept: [phone] and follow the prompts.   For any non-urgent questions, you may also contact your provider using MyChart. We now offer e-Visits for anyone 45 and older to request care online for non-urgent symptoms. For details visit [email or web address].   Also download the MyChart app! Go to the app store, search "MyChart", open the app, select [facility], and log in with your MyChart username and password.  Due to Covid, a mask is required upon entering the hospital/clinic. If you do not have a mask, one will be given to you upon arrival. For doctor visits, patients may have 1 support person aged 33 or older with them. For treatment visits, patients cannot have anyone with them due to current Covid guidelines and our immunocompromised population.

## 2021-04-09 NOTE — Progress Notes (Signed)
[facility] [phone]   [phone]  OFFICE PROGRESS NOTE  Harris, [name], [name] [address] [place] [address]  DIAGNOSIS: Metastatic non-small cell lung cancer, adenocarcinoma. This was initially diagnosed as stage IB (T2a, N0, M0) in March of 2014, status post right upper lobectomy with lymph node dissection but the patient has evidence for disease recurrence in the right axilla status post resection.  PRIOR THERAPY:  1) Status post right axillary lymph node biopsy on 05/01/2014 under the care of Dr. Roxan Hockey. 2) Systemic chemotherapy with carboplatin for AUC of 5 and Alimta 500 mg/M2 every 3 weeks. First cycle 05/31/2014. Status post 6 cycles. Last dose was given 09/22/2014. 3) palliative radiotherapy to the subcarinal lymph node under the care of Dr. Sondra Come. 4) Nivolumab 240 mg IV every 2 weeks. First dose 12/23/2016. Status post 6 cycles.  CURRENT THERAPY:  Nivolumab 480 mg IV every 4 weeks. First dose 03/17/2017. Status post 52 cycles.  INTERVAL HISTORY: Nancy Hodges 75 y.o. female returns to the clinic today for follow-up visit.  The has no complaints today except for skin rash that lasted for 3 days after work at her yard.  These are likely secondary to mosquito bites.  She denied having any current chest pain, shortness of breath, cough or hemoptysis.  She denied having any fever or chills.  She has no nausea, vomiting, diarrhea or constipation.  She denied having any headache or visual changes.  She has no weight loss or night sweats.  She continues to tolerate her treatment with nivolumab fairly well.  The patient is here today for evaluation before starting cycle #53. MEDICAL HISTORY: Past Medical History:  Diagnosis Date   Allergic rhinitis    Anxiety    Arthritis    Asthma    Atrial flutter ([facility])    Cholelithiases 09/26/2015   Diverticulosis    Encounter for antineoplastic immunotherapy 12/09/2016   GERD  (gastroesophageal reflux disease)    H/O hiatal hernia    History of blood transfusion    History of chemotherapy    History of kidney stones    History of radiation therapy    Internal hemorrhoid    Lung cancer ([facility])    a. Stage IB non-small cell carcinoma, s/p R VATS, wedge resection, RU lobectomy 10/2012.   Persistent atrial fibrillation ([facility])    a. 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    ALLERGIES:  is allergic to chlorpheniramine-dm, prednisone, septra [sulfamethoxazole-trimethoprim], vioxx [rofecoxib], molds & smuts, and penicillins.  MEDICATIONS:  Current Outpatient Medications  Medication Sig Dispense Refill   acetaminophen (TYLENOL) 500 MG tablet Take 500 mg by mouth 3 (three) times daily as needed for moderate pain or fever.      Acetaminophen-DM 1000-30 MG/30ML LIQD      albuterol (PROVENTIL HFA;VENTOLIN HFA) 108 (90 BASE) MCG/ACT inhaler Inhale 2 puffs into the lungs every 6 (six) hours as needed for wheezing or shortness of breath.      azelastine (ASTELIN) 0.1 % nasal spray Place 1 spray into both nostrils 2 (two) times daily. Use in each nostril as directed     calcium carbonate (TUMS - DOSED IN MG ELEMENTAL CALCIUM) 500 MG chewable tablet Chew 1 tablet by mouth 2 (two) times daily as needed  for indigestion or heartburn.      diltiazem (CARTIA XT) 240 MG 24 hr capsule Take 1 capsule (240 mg total) by mouth daily. Please let pt know to keep upcoming appt in Aug for further refills 90 capsule 0   dimenhyDRINATE (DRAMAMINE) 50 MG tablet Take 12.5 mg by mouth every 8 (eight) hours as needed for dizziness.     ipratropium-albuterol (DUONEB) 0.5-2.5 (3) MG/3ML SOLN Take 3 mLs by nebulization every 6 (six) hours as needed (shortness of breath or wheezing).      levothyroxine (SYNTHROID) 50 MCG tablet  TAKE 1 TABLET(50 MCG) BY MOUTH DAILY BEFORE AND BREAKFAST 90 tablet 0   lidocaine-prilocaine (EMLA) cream APPLY TOPICALLY TO TO THE AFFECTED AREA AS NEEDED 30 g 4   loratadine (CLARITIN) 10 MG tablet Take 10 mg by mouth daily.     OVER THE COUNTER MEDICATION Take 5 mLs by mouth daily as needed (cough). [employer] kids cough syrup     Polyvinyl Alcohol-Povidone (REFRESH OP) Place 1 drop into both eyes daily as needed (dry eyes).     XARELTO 20 MG TABS tablet TAKE 1 TABLET(20 MG) BY MOUTH DAILY WITH SUPPER 30 tablet 5   No current facility-administered medications for this visit.    SURGICAL HISTORY:  Past Surgical History:  Procedure Laterality Date   CARDIOVERSION N/A 02/19/2018   Procedure: CARDIOVERSION;  Surgeon: Sanda Klein, MD;  Location: [facility]COPY;  Service: Cardiovascular;  Laterality: N/A;   CARDIOVERSION N/A 04/30/2018   Procedure: CARDIOVERSION;  Surgeon: Thayer Headings, MD;  Location: [facility] ENDOSCOPY;  Service: Cardiovascular;  Laterality: N/A;   CATARACT EXTRACTION W/PHACO Right 08/14/2016   Procedure: CATARACT EXTRACTION PHACO AND INTRAOCULAR LENS PLACEMENT RIGHT EYE CDE 10.53;  Surgeon: Tonny Branch, MD;  Location: [facility];  Service: Ophthalmology;  Laterality: Right;  right   CATARACT EXTRACTION W/PHACO Left 09/25/2016   Procedure: CATARACT EXTRACTION PHACO AND INTRAOCULAR LENS PLACEMENT (IOC);  Surgeon: Tonny Branch, MD;  Location: [facility];  Service: Ophthalmology;  Laterality: Left;  left CDE 8.81   COLONOSCOPY     [facility]: cattered sigmoid diverticula, internal hemorrhoids, sessile polyp in ascending and mid transverse colon (tubular adenoma).    colonoscopy with polypectomy  2015   ESOPHAGOGASTRODUODENOSCOPY     [facility]: bile reflux in stomach, mild gastritis, hiatal hernia   ESOPHAGOGASTRODUODENOSCOPY N/A 01/31/2016   Procedure: ESOPHAGOGASTRODUODENOSCOPY (EGD);  Surgeon: [name], MD;  [facility];  Service: Endoscopy;  Laterality: N/A;  1200   IR FLUORO  GUIDE PORT INSERTION RIGHT  06/30/2017   IR US GUIDE VASC ACCESS RIGHT  06/30/2017   LOBECTOMY Right 10/27/2012   Procedure: LOBECTOMY;  Surgeon: [name], MD;  Location: [facility];  Service: Thoracic;  Laterality: Right;   RIGHT UPPER LOBECTOMY & Node Dissection   LYMPH NODE BIOPSY Right 05/01/2014   Procedure: LYMPH NODE BIOPSY, Right Axillary;  Surgeon: [name], MD;  Location: [facility];  Service: Thoracic;  Laterality: Right;   PACEMAKER IMPLANT N/A 10/30/2017   SJM Assurity MRI PPM implanted by Dr [name] once leadless pacemaker battery depleted   PARTIAL HYSTERECTOMY     PERMANENT PACEMAKER INSERTION N/A 09/14/2012   Nanostim (SJM) leadless pacemaker (LEADLESS II STUDY PATIENT) implanted by Dr [name], no longer functioning, device abandoned.   VIDEO ASSISTED THORACOSCOPY (VATS)/WEDGE RESECTION Right 10/27/2012   Procedure: VIDEO ASSISTED THORACOSCOPY (VATS),RGHT UPPER LOBE LUNG WEDGE RESECTION;  Surgeon: [name], MD;  Location: [facility];  Service: Thoracic;  Laterality: Right;  REVIEW OF SYSTEMS:  A comprehensive review of systems was negative.   PHYSICAL EXAMINATION: General appearance: alert, cooperative and no distress Head: Normocephalic, without obvious abnormality, atraumatic Neck: no adenopathy, no JVD, supple, symmetrical, trachea midline and thyroid not enlarged, symmetric, no tenderness/mass/nodules Lymph nodes: Cervical, supraclavicular, and axillary nodes normal. Resp: clear to auscultation bilaterally Back: symmetric, no curvature. ROM normal. No CVA tenderness. Cardio: regular rate and rhythm, S1, S2 normal, no murmur, click, rub or gallop GI: soft, non-tender; bowel sounds normal; no masses,  no organomegaly Extremities: extremities normal, atraumatic, no cyanosis or edema  ECOG PERFORMANCE STATUS: 1 - Symptomatic but completely ambulatory  Blood pressure (!) 132/59, pulse (!) 59, temperature (!) 96.2 F (35.7 C), temperature source Tympanic,  resp. rate 18, weight 147 lb (66.7 kg), SpO2 98 %.  LABORATORY DATA: Lab Results  Component Value Date   WBC 5.9 04/09/2021   HGB 13.5 04/09/2021   HCT 38.3 04/09/2021   MCV 96.2 04/09/2021   PLT 189 04/09/2021      Chemistry      Component Value Date/Time   NA 141 03/12/2021 1034   NA 139 08/05/2017 0902   K 4.2 03/12/2021 1034   K 4.1 08/05/2017 0902   CL 106 03/12/2021 1034   CO2 28 03/12/2021 1034   CO2 28 08/05/2017 0902   BUN 10 03/12/2021 1034   BUN 10.1 08/05/2017 0902   CREATININE 0.72 03/12/2021 1034   CREATININE 0.8 08/05/2017 0902   GLU 134 (H) 02/18/2017 1410      Component Value Date/Time   CALCIUM 9.5 03/12/2021 1034   CALCIUM 9.5 08/05/2017 0902   ALKPHOS 103 03/12/2021 1034   ALKPHOS 111 08/05/2017 0902   AST 44 (H) 03/12/2021 1034   AST 33 08/05/2017 0902   ALT 36 03/12/2021 1034   ALT 34 08/05/2017 0902   BILITOT 1.4 (H) 03/12/2021 1034   BILITOT 1.39 (H) 08/05/2017 0902       RADIOGRAPHIC STUDIES: No results found.  ASSESSMENT AND PLAN:  This is a very pleasant 75 years old white female with metastatic non-small cell lung cancer, adenocarcinoma with no actionable mutations status post systemic chemotherapy with carboplatin and Alimta and she is currently undergoing treatment with second line immunotherapy with Nivolumab status post 6 cycles. This was followed by immunotherapy with Nivolumab 480 mg IV every 4 weeks status post 52 cycles. She has been tolerating this treatment well with no concerning adverse effects. I recommended for her to proceed with cycle #53 today as planned. I will see her back for follow-up visit in 4 weeks for evaluation with repeat CT scan of the chest, abdomen pelvis for restaging of her disease. For the cardiac arrhythmia, she is followed by Dr. [name]. For the hypothyroidism she will continue on her current treatment with levothyroxine and will monitor her TSH closely. The patient was advised to call immediately if  she has any other concerning symptoms in the interval. The patient voices understanding of current disease status and treatment options and is in agreement with the current care plan. All questions were answered. The patient knows to call the clinic with any problems, questions or concerns. We can certainly see the patient much sooner if necessary.  Disclaimer: This note was dictated with voice recognition software. Similar sounding words can inadvertently be transcribed and may not be corrected upon review.

## 2021-04-10 ENCOUNTER — Other Ambulatory Visit: Payer: Self-pay

## 2021-04-10 MED ORDER — DILTIAZEM HCL ER COATED BEADS 240 MG PO CP24: 240.0000 mg | ORAL_CAPSULE | Freq: Every day | ORAL | 3 refills | Status: DC

## 2021-04-10 NOTE — Telephone Encounter (Signed)
Prescription refill request for Xarelto received.  Indication: afib  Last office visit: [place], 03/08/2021 Weight: 66.7 kg  Age: 75 yo  Scr: 0.77, 04/09/2021 CrCl: 64 ml/min   Refill sent.

## 2021-04-10 NOTE — Telephone Encounter (Signed)
Pt called stating that her medication was changed and that she needed her medication to be changed back to the way it would. I sent the corrected medication to pt's pharmacy as requested. Confirmation received.

## 2021-04-11 ENCOUNTER — Other Ambulatory Visit: Payer: Self-pay | Admitting: Internal Medicine

## 2021-04-11 NOTE — Telephone Encounter (Signed)
Prescription refill request for Xarelto received.  Indication: Atrial Fib Last office visit: 03/08/21  Clearnce Hasten MD Weight: 64.4kg Age: 75 Scr: 0.77 on 04/09/21 CrCl: 62.20  Based on above findings Xarelto 20mg  daily is the appropriate dose.  Refill approved.

## 2021-04-15 ENCOUNTER — Telehealth: Payer: Self-pay | Admitting: Internal Medicine

## 2021-04-15 NOTE — Telephone Encounter (Signed)
Pt c/o medication issue:  1. Name of Medication: diltiazem (CARTIA XT) 240 MG 24 hr capsule  2. How are you currently taking this medication (dosage and times per day)? As directed   3. Are you having a reaction (difficulty breathing--STAT)? Not now.   4. What is your medication issue? Patient says the blue pill she got from her pharmacy makes her sick. She needs to take the pink pill. Her pharmacy keeps giving her the blue pill   Please send updated rx to [facility], [place]

## 2021-04-15 NOTE — Telephone Encounter (Signed)
Call placed to pharmacy - as suspected, different colors just mean different manufacturers.  Pharm states that patient needs to call prior to requesting refill to let them know she only wants the "blue" pill.    Left message to return call.

## 2021-04-16 NOTE — Telephone Encounter (Signed)
Patient made aware that she has new refill that was sent in on 04/10/2021 for 90 day supply with 3 refills.   Per pharm - she needs to call them prior to refill & request what specific pill she wants.  She verbalized understanding.

## 2021-04-16 NOTE — Telephone Encounter (Signed)
Pt is returning call.  

## 2021-05-01 ENCOUNTER — Ambulatory Visit (INDEPENDENT_AMBULATORY_CARE_PROVIDER_SITE_OTHER): Payer: Medicare Other

## 2021-05-01 DIAGNOSIS — I495 Sick sinus syndrome: Secondary | ICD-10-CM | POA: Diagnosis not present

## 2021-05-02 LAB — CUP PACEART REMOTE DEVICE CHECK
Battery Remaining Longevity: 90 mo
Battery Remaining Percentage: 70 %
Battery Voltage: 3.01 V
Brady Statistic RV Percent Paced: 39 %
Date Time Interrogation Session: 20220928020015
Implantable Lead Implant Date: 20190329
Implantable Lead Implant Date: 20190329
Implantable Lead Location: 753859
Implantable Lead Location: 753860
Implantable Pulse Generator Implant Date: 20190329
Lead Channel Impedance Value: 600 Ohm
Lead Channel Pacing Threshold Amplitude: 0.75 V
Lead Channel Pacing Threshold Pulse Width: 0.5 ms
Lead Channel Sensing Intrinsic Amplitude: 12 mV
Lead Channel Setting Pacing Amplitude: 2.5 V
Lead Channel Setting Pacing Pulse Width: 0.5 ms
Lead Channel Setting Sensing Sensitivity: 2 mV
Pulse Gen Model: 2272
Pulse Gen Serial Number: 9003317

## 2021-05-03 ENCOUNTER — Ambulatory Visit (HOSPITAL_COMMUNITY): Payer: Medicare Other

## 2021-05-07 ENCOUNTER — Inpatient Hospital Stay: Payer: Medicare Other | Attending: Internal Medicine

## 2021-05-07 ENCOUNTER — Inpatient Hospital Stay (HOSPITAL_BASED_OUTPATIENT_CLINIC_OR_DEPARTMENT_OTHER): Payer: Medicare Other | Admitting: Internal Medicine

## 2021-05-07 ENCOUNTER — Other Ambulatory Visit: Payer: Self-pay

## 2021-05-07 ENCOUNTER — Inpatient Hospital Stay: Payer: Medicare Other

## 2021-05-07 ENCOUNTER — Encounter: Payer: Self-pay | Admitting: Internal Medicine

## 2021-05-07 VITALS — BP 133/63 | HR 73 | Temp 96.6°F | Resp 20 | Ht 62.0 in | Wt 146.1 lb

## 2021-05-07 DIAGNOSIS — C3411 Malignant neoplasm of upper lobe, right bronchus or lung: Secondary | ICD-10-CM | POA: Insufficient documentation

## 2021-05-07 DIAGNOSIS — C3490 Malignant neoplasm of unspecified part of unspecified bronchus or lung: Secondary | ICD-10-CM

## 2021-05-07 DIAGNOSIS — R5383 Other fatigue: Secondary | ICD-10-CM

## 2021-05-07 DIAGNOSIS — C773 Secondary and unspecified malignant neoplasm of axilla and upper limb lymph nodes: Secondary | ICD-10-CM | POA: Insufficient documentation

## 2021-05-07 DIAGNOSIS — C3491 Malignant neoplasm of unspecified part of right bronchus or lung: Secondary | ICD-10-CM | POA: Diagnosis not present

## 2021-05-07 DIAGNOSIS — Z5112 Encounter for antineoplastic immunotherapy: Secondary | ICD-10-CM | POA: Diagnosis present

## 2021-05-07 DIAGNOSIS — Z79899 Other long term (current) drug therapy: Secondary | ICD-10-CM | POA: Insufficient documentation

## 2021-05-07 DIAGNOSIS — Z95828 Presence of other vascular implants and grafts: Secondary | ICD-10-CM

## 2021-05-07 LAB — CBC WITH DIFFERENTIAL (CANCER CENTER ONLY)
Abs Immature Granulocytes: 0.01 10*3/uL (ref 0.00–0.07)
Basophils Absolute: 0.1 10*3/uL (ref 0.0–0.1)
Basophils Relative: 1 %
Eosinophils Absolute: 0.2 10*3/uL (ref 0.0–0.5)
Eosinophils Relative: 4 %
HCT: 39.6 % (ref 36.0–46.0)
Hemoglobin: 13.9 g/dL (ref 12.0–15.0)
Immature Granulocytes: 0 %
Lymphocytes Relative: 21 %
Lymphs Abs: 1.4 10*3/uL (ref 0.7–4.0)
MCH: 33.7 pg (ref 26.0–34.0)
MCHC: 35.1 g/dL (ref 30.0–36.0)
MCV: 95.9 fL (ref 80.0–100.0)
Monocytes Absolute: 0.4 10*3/uL (ref 0.1–1.0)
Monocytes Relative: 7 %
Neutro Abs: 4.5 10*3/uL (ref 1.7–7.7)
Neutrophils Relative %: 67 %
Platelet Count: 187 10*3/uL (ref 150–400)
RBC: 4.13 MIL/uL (ref 3.87–5.11)
RDW: 12.7 % (ref 11.5–15.5)
WBC Count: 6.5 10*3/uL (ref 4.0–10.5)
nRBC: 0 % (ref 0.0–0.2)

## 2021-05-07 LAB — CMP (CANCER CENTER ONLY)
ALT: 49 U/L — ABNORMAL HIGH (ref 0–44)
AST: 56 U/L — ABNORMAL HIGH (ref 15–41)
Albumin: 3.8 g/dL (ref 3.5–5.0)
Alkaline Phosphatase: 106 U/L (ref 38–126)
Anion gap: 10 (ref 5–15)
BUN: 9 mg/dL (ref 8–23)
CO2: 26 mmol/L (ref 22–32)
Calcium: 10 mg/dL (ref 8.9–10.3)
Chloride: 103 mmol/L (ref 98–111)
Creatinine: 0.8 mg/dL (ref 0.44–1.00)
GFR, Estimated: >60 mL/min (ref >60)
Glucose, Bld: 179 mg/dL — ABNORMAL HIGH (ref 70–99)
Potassium: 4.4 mmol/L (ref 3.5–5.1)
Sodium: 139 mmol/L (ref 135–145)
Total Bilirubin: 1.2 mg/dL (ref 0.3–1.2)
Total Protein: 7.3 g/dL (ref 6.5–8.1)

## 2021-05-07 LAB — TSH: TSH: 5.201 u[IU]/mL — ABNORMAL HIGH (ref 0.308–3.960)

## 2021-05-07 MED ORDER — SODIUM CHLORIDE 0.9 % IV SOLN
480.0000 mg | Freq: Once | INTRAVENOUS | Status: AC
  Administered 2021-05-07: 480 mg via INTRAVENOUS
  Filled 2021-05-07: qty 48

## 2021-05-07 MED ORDER — SODIUM CHLORIDE 0.9 % IV SOLN: Freq: Once | INTRAVENOUS | Status: AC

## 2021-05-07 MED ORDER — SODIUM CHLORIDE 0.9% FLUSH
10.0000 mL | INTRAVENOUS | Status: DC | PRN
Start: 2021-05-07 — End: 2021-05-07
  Administered 2021-05-07 (×2): 10 mL

## 2021-05-07 MED ORDER — HEPARIN SOD (PORK) LOCK FLUSH 100 UNIT/ML IV SOLN
500.0000 [IU] | Freq: Once | INTRAVENOUS | Status: AC | PRN
  Administered 2021-05-07: 500 [IU]

## 2021-05-07 MED ORDER — SODIUM CHLORIDE 0.9% FLUSH
10.0000 mL | INTRAVENOUS | Status: DC | PRN
  Administered 2021-05-07: 10 mL via INTRAVENOUS

## 2021-05-07 NOTE — Patient Instructions (Signed)
[facility]  ? Discharge Instructions: ?Thank you for choosing [facility] to provide your oncology and hematology care.  ? ?If you have a lab appointment with the Cancer Center, please go directly to the Cancer Center and check in at the registration area. ?  ?Wear comfortable clothing and clothing appropriate for easy access to any Portacath or PICC line.  ? ?We strive to give you quality time with your provider. You may need to reschedule your appointment if you arrive late (15 or more minutes).  Arriving late affects you and other patients whose appointments are after yours.  Also, if you miss three or more appointments without notifying the office, you may be dismissed from the clinic at the provider?s discretion.    ?  ?For prescription refill requests, have your pharmacy contact our office and allow 72 hours for refills to be completed.   ? ?Today you received the following chemotherapy and/or immunotherapy agents: nivolumab    ?  ?To help prevent nausea and vomiting after your treatment, we encourage you to take your nausea medication as directed. ? ?BELOW ARE SYMPTOMS THAT SHOULD BE REPORTED IMMEDIATELY: ?*FEVER GREATER THAN 100.4 F (38 ?C) OR HIGHER ?*CHILLS OR SWEATING ?*NAUSEA AND VOMITING THAT IS NOT CONTROLLED WITH YOUR NAUSEA MEDICATION ?*UNUSUAL SHORTNESS OF BREATH ?*UNUSUAL BRUISING OR BLEEDING ?*URINARY PROBLEMS (pain or burning when urinating, or frequent urination) ?*BOWEL PROBLEMS (unusual diarrhea, constipation, pain near the anus) ?TENDERNESS IN MOUTH AND THROAT WITH OR WITHOUT PRESENCE OF ULCERS (sore throat, sores in mouth, or a toothache) ?UNUSUAL RASH, SWELLING OR PAIN  ?UNUSUAL VAGINAL DISCHARGE OR ITCHING  ? ?Items with * indicate a potential emergency and should be followed up as soon as possible or go to the Emergency Department if any problems should occur. ? ?Please show the CHEMOTHERAPY ALERT CARD or IMMUNOTHERAPY ALERT CARD at check-in  to the Emergency Department and triage nurse. ? ?Should you have questions after your visit or need to cancel or reschedule your appointment, please contact [facility]  Dept: [phone]  and follow the prompts.  Office hours are 8:00 a.m. to 4:30 p.m. Monday - Friday. Please note that voicemails left after 4:00 p.m. may not be returned until the following business day.  We are closed weekends and major holidays. You have access to a nurse at all times for urgent questions. Please call the main number to the clinic Dept: [phone] and follow the prompts. ? ? ?For any non-urgent questions, you may also contact your provider using MyChart. We now offer e-Visits for anyone 18 and older to request care online for non-urgent symptoms. For details visit [email or web address]. ?  ?Also download the MyChart app! Go to the app store, search "MyChart", open the app, select [facility], and log in with your MyChart username and password. ? ?Due to Covid, a mask is required upon entering the hospital/clinic. If you do not have a mask, one will be given to you upon arrival. For doctor visits, patients may have 1 support person aged 18 or older with them. For treatment visits, patients cannot have anyone with them due to current Covid guidelines and our immunocompromised population.  ? ?
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
## 2021-05-08 ENCOUNTER — Telehealth: Payer: Self-pay | Admitting: Internal Medicine

## 2021-05-08 NOTE — Telephone Encounter (Signed)
Scheduled follow-up appointments per 10/4 los. Patient is aware. Mailed calendar.

## 2021-05-08 NOTE — Progress Notes (Signed)
Remote pacemaker transmission.   

## 2021-05-23 ENCOUNTER — Other Ambulatory Visit: Payer: Self-pay | Admitting: Internal Medicine

## 2021-05-23 DIAGNOSIS — Z1231 Encounter for screening mammogram for malignant neoplasm of breast: Secondary | ICD-10-CM

## 2021-05-31 ENCOUNTER — Other Ambulatory Visit: Payer: Self-pay

## 2021-05-31 ENCOUNTER — Ambulatory Visit (HOSPITAL_COMMUNITY)
Admission: RE | Admit: 2021-05-31 | Discharge: 2021-05-31 | Disposition: A | Discharge status: Home / Self Care | Payer: Medicare Other | Source: Ambulatory Visit | Attending: Internal Medicine | Admitting: Internal Medicine

## 2021-05-31 DIAGNOSIS — C3490 Malignant neoplasm of unspecified part of unspecified bronchus or lung: Secondary | ICD-10-CM | POA: Diagnosis present

## 2021-05-31 MED ORDER — IOHEXOL 350 MG/ML SOLN
80.0000 mL | Freq: Once | INTRAVENOUS | Status: AC | PRN
  Administered 2021-05-31: 80 mL via INTRAVENOUS

## 2021-05-31 MED ORDER — HEPARIN SOD (PORK) LOCK FLUSH 100 UNIT/ML IV SOLN
500.0000 [IU] | Freq: Once | INTRAVENOUS | Status: AC
  Administered 2021-05-31: 500 [IU] via INTRAVENOUS

## 2021-06-04 ENCOUNTER — Inpatient Hospital Stay: Payer: Medicare Other | Attending: Internal Medicine

## 2021-06-04 ENCOUNTER — Inpatient Hospital Stay (HOSPITAL_BASED_OUTPATIENT_CLINIC_OR_DEPARTMENT_OTHER): Payer: Medicare Other | Admitting: Internal Medicine

## 2021-06-04 ENCOUNTER — Other Ambulatory Visit: Payer: Self-pay

## 2021-06-04 ENCOUNTER — Inpatient Hospital Stay: Payer: Medicare Other

## 2021-06-04 VITALS — BP 137/62 | HR 62 | Temp 98.4°F | Resp 15 | Wt 145.1 lb

## 2021-06-04 DIAGNOSIS — Z79899 Other long term (current) drug therapy: Secondary | ICD-10-CM | POA: Diagnosis not present

## 2021-06-04 DIAGNOSIS — C773 Secondary and unspecified malignant neoplasm of axilla and upper limb lymph nodes: Secondary | ICD-10-CM | POA: Insufficient documentation

## 2021-06-04 DIAGNOSIS — C3490 Malignant neoplasm of unspecified part of unspecified bronchus or lung: Secondary | ICD-10-CM

## 2021-06-04 DIAGNOSIS — R5383 Other fatigue: Secondary | ICD-10-CM

## 2021-06-04 DIAGNOSIS — C3491 Malignant neoplasm of unspecified part of right bronchus or lung: Secondary | ICD-10-CM

## 2021-06-04 DIAGNOSIS — Z5112 Encounter for antineoplastic immunotherapy: Secondary | ICD-10-CM | POA: Diagnosis present

## 2021-06-04 DIAGNOSIS — C3411 Malignant neoplasm of upper lobe, right bronchus or lung: Secondary | ICD-10-CM | POA: Insufficient documentation

## 2021-06-04 DIAGNOSIS — Z95828 Presence of other vascular implants and grafts: Secondary | ICD-10-CM

## 2021-06-04 LAB — CBC WITH DIFFERENTIAL (CANCER CENTER ONLY)
Abs Immature Granulocytes: 0.02 10*3/uL (ref 0.00–0.07)
Basophils Absolute: 0.1 10*3/uL (ref 0.0–0.1)
Basophils Relative: 1 %
Eosinophils Absolute: 0.2 10*3/uL (ref 0.0–0.5)
Eosinophils Relative: 3 %
HCT: 38.6 % (ref 36.0–46.0)
Hemoglobin: 13.4 g/dL (ref 12.0–15.0)
Immature Granulocytes: 0 %
Lymphocytes Relative: 22 %
Lymphs Abs: 1.3 10*3/uL (ref 0.7–4.0)
MCH: 33.3 pg (ref 26.0–34.0)
MCHC: 34.7 g/dL (ref 30.0–36.0)
MCV: 96 fL (ref 80.0–100.0)
Monocytes Absolute: 0.5 10*3/uL (ref 0.1–1.0)
Monocytes Relative: 8 %
Neutro Abs: 3.9 10*3/uL (ref 1.7–7.7)
Neutrophils Relative %: 66 %
Platelet Count: 190 10*3/uL (ref 150–400)
RBC: 4.02 MIL/uL (ref 3.87–5.11)
RDW: 12.9 % (ref 11.5–15.5)
WBC Count: 5.9 10*3/uL (ref 4.0–10.5)
nRBC: 0 % (ref 0.0–0.2)

## 2021-06-04 LAB — CMP (CANCER CENTER ONLY)
ALT: 39 U/L (ref 0–44)
AST: 50 U/L — ABNORMAL HIGH (ref 15–41)
Albumin: 3.7 g/dL (ref 3.5–5.0)
Alkaline Phosphatase: 113 U/L (ref 38–126)
Anion gap: 8 (ref 5–15)
BUN: 10 mg/dL (ref 8–23)
CO2: 28 mmol/L (ref 22–32)
Calcium: 9.5 mg/dL (ref 8.9–10.3)
Chloride: 103 mmol/L (ref 98–111)
Creatinine: 0.79 mg/dL (ref 0.44–1.00)
GFR, Estimated: >60 mL/min (ref >60)
Glucose, Bld: 164 mg/dL — ABNORMAL HIGH (ref 70–99)
Potassium: 4.4 mmol/L (ref 3.5–5.1)
Sodium: 139 mmol/L (ref 135–145)
Total Bilirubin: 1.3 mg/dL — ABNORMAL HIGH (ref 0.3–1.2)
Total Protein: 7.2 g/dL (ref 6.5–8.1)

## 2021-06-04 LAB — TSH: TSH: 2.458 u[IU]/mL (ref 0.308–3.960)

## 2021-06-04 MED ORDER — HEPARIN SOD (PORK) LOCK FLUSH 100 UNIT/ML IV SOLN
500.0000 [IU] | Freq: Once | INTRAVENOUS | Status: AC | PRN
  Administered 2021-06-04: 500 [IU]

## 2021-06-04 MED ORDER — SODIUM CHLORIDE 0.9% FLUSH
10.0000 mL | INTRAVENOUS | Status: DC | PRN
  Administered 2021-06-04: 10 mL via INTRAVENOUS

## 2021-06-04 MED ORDER — SODIUM CHLORIDE 0.9 % IV SOLN: Freq: Once | INTRAVENOUS | Status: AC

## 2021-06-04 MED ORDER — SODIUM CHLORIDE 0.9 % IV SOLN
480.0000 mg | Freq: Once | INTRAVENOUS | Status: AC
  Administered 2021-06-04: 480 mg via INTRAVENOUS
  Filled 2021-06-04: qty 48

## 2021-06-04 MED ORDER — SODIUM CHLORIDE 0.9% FLUSH
10.0000 mL | INTRAVENOUS | Status: DC | PRN
  Administered 2021-06-04: 10 mL

## 2021-06-04 NOTE — Progress Notes (Signed)
[facility] [phone]   Fax:([phone]  OFFICE PROGRESS NOTE  Harris, [name], [name] [address] [place] [address]  DIAGNOSIS: Metastatic non-small cell lung cancer, adenocarcinoma. This was initially diagnosed as stage IB (T2a, N0, M0) in March of 2014, status post right upper lobectomy with lymph node dissection but the patient has evidence for disease recurrence in the right axilla status post resection.  PRIOR THERAPY:  1) Status post right axillary lymph node biopsy on 05/01/2014 under the care of Dr. Roxan Hockey. 2) Systemic chemotherapy with carboplatin for AUC of 5 and Alimta 500 mg/M2 every 3 weeks. First cycle 05/31/2014. Status post 6 cycles. Last dose was given 09/22/2014. 3) palliative radiotherapy to the subcarinal lymph node under the care of Dr. Sondra Come. 4) Nivolumab 240 mg IV every 2 weeks. First dose 12/23/2016. Status post 6 cycles.  CURRENT THERAPY:  Nivolumab 480 mg IV every 4 weeks. First dose 03/17/2017. Status post 54 cycles.  INTERVAL HISTORY: Nancy Hodges 75 y.o. female returns to the clinic today for follow-up visit.  The patient is feeling fine today with no concerning complaints.  She denied having any current chest pain, shortness of breath, cough or hemoptysis.  She denied having any fever or chills.  She has no nausea, vomiting, diarrhea or constipation.  She has no headache or visual changes.  She has some aching pain after working too much in the yard.  She had repeat CT scan of the chest, abdomen pelvis performed recently and she is here for evaluation and discussion of her scan results.  MEDICAL HISTORY: Past Medical History:  Diagnosis Date   Allergic rhinitis    Anxiety    Arthritis    Asthma    Atrial flutter ([place])    Cholelithiases 09/26/2015   Diverticulosis    Encounter for antineoplastic immunotherapy 12/09/2016   GERD (gastroesophageal reflux disease)    H/O hiatal hernia    History of  blood transfusion    History of chemotherapy    History of kidney stones    History of radiation therapy    Internal hemorrhoid    Lung cancer ([facility])    a. Stage IB non-small cell carcinoma, s/p R VATS, wedge resection, RU lobectomy 10/2012.   Persistent atrial fibrillation ([facility])    a. 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    ALLERGIES:  is allergic to chlorpheniramine-dm, prednisone, rofecoxib, septra [sulfamethoxazole-trimethoprim], molds & smuts, and penicillins.  MEDICATIONS:  Current Outpatient Medications  Medication Sig Dispense Refill   acetaminophen (TYLENOL) 500 MG tablet Take 500 mg by mouth 3 (three) times daily as needed for moderate pain or fever.      Acetaminophen-DM 1000-30 MG/30ML LIQD      albuterol (PROVENTIL HFA;VENTOLIN HFA) 108 (90 BASE) MCG/ACT inhaler Inhale 2 puffs into the lungs every 6 (six) hours as needed for wheezing or shortness of breath.      azelastine (ASTELIN) 0.1 % nasal spray Place 1 spray into both nostrils 2 (two) times daily. Use in each nostril as directed     calcium carbonate (TUMS - DOSED IN MG ELEMENTAL CALCIUM) 500 MG chewable tablet Chew 1 tablet by mouth 2 (two) times daily as needed for indigestion or heartburn.      diltiazem (CARTIA  XT) 240 MG 24 hr capsule Take 1 capsule (240 mg total) by mouth daily. 90 capsule 3   dimenhyDRINATE (DRAMAMINE) 50 MG tablet Take 12.5 mg by mouth every 8 (eight) hours as needed for dizziness.     ipratropium-albuterol (DUONEB) 0.5-2.5 (3) MG/3ML SOLN Take 3 mLs by nebulization every 6 (six) hours as needed (shortness of breath or wheezing).      levothyroxine (SYNTHROID) 50 MCG tablet TAKE 1 TABLET(50 MCG) BY MOUTH DAILY BEFORE AND BREAKFAST 90 tablet 0   lidocaine-prilocaine (EMLA) cream APPLY TOPICALLY TO TO THE AFFECTED AREA  AS NEEDED 30 g 4   loratadine (CLARITIN) 10 MG tablet Take 10 mg by mouth daily.     OVER THE COUNTER MEDICATION Take 5 mLs by mouth daily as needed (cough). [employer] kids cough syrup     Polyvinyl Alcohol-Povidone (REFRESH OP) Place 1 drop into both eyes daily as needed (dry eyes).     XARELTO 20 MG TABS tablet TAKE 1 TABLET(20 MG) BY MOUTH DAILY WITH SUPPER 30 tablet 5   No current facility-administered medications for this visit.    SURGICAL HISTORY:  Past Surgical History:  Procedure Laterality Date   CARDIOVERSION N/A 02/19/2018   Procedure: CARDIOVERSION;  Surgeon: Sanda Klein, MD;  Location: [facility];  Service: Cardiovascular;  Laterality: N/A;   CARDIOVERSION N/A 04/30/2018   Procedure: CARDIOVERSION;  Surgeon: Thayer Headings, MD;  Location: [facility] ENDOSCOPY;  Service: Cardiovascular;  Laterality: N/A;   CATARACT EXTRACTION W/PHACO Right 08/14/2016   Procedure: CATARACT EXTRACTION PHACO AND INTRAOCULAR LENS PLACEMENT RIGHT EYE CDE 10.53;  Surgeon: Tonny Branch, MD;  Location: [facility];  Service: Ophthalmology;  Laterality: Right;  right   CATARACT EXTRACTION W/PHACO Left 09/25/2016   Procedure: CATARACT EXTRACTION PHACO AND INTRAOCULAR LENS PLACEMENT (IOC);  Surgeon: Tonny Branch, MD;  Location: [facility];  Service: Ophthalmology;  Laterality: Left;  left CDE 8.81   COLONOSCOPY     [facility]: cattered sigmoid diverticula, internal hemorrhoids, sessile polyp in ascending and mid transverse colon (tubular adenoma).    colonoscopy with polypectomy  2015   ESOPHAGOGASTRODUODENOSCOPY     [facility]: bile reflux in stomach, mild gastritis, hiatal hernia   ESOPHAGOGASTRODUODENOSCOPY N/A 01/31/2016   Procedure: ESOPHAGOGASTRODUODENOSCOPY (EGD);  Surgeon: [name], MD;  [facility];  Service: Endoscopy;  Laterality: N/A;  1200   IR FLUORO GUIDE PORT INSERTION RIGHT  06/30/2017   IR US GUIDE VASC ACCESS RIGHT  06/30/2017   LOBECTOMY Right 10/27/2012   Procedure: LOBECTOMY;   Surgeon: [name], MD;  Location: [facility];  Service: Thoracic;  Laterality: Right;   RIGHT UPPER LOBECTOMY & Node Dissection   LYMPH NODE BIOPSY Right 05/01/2014   Procedure: LYMPH NODE BIOPSY, Right Axillary;  Surgeon: [name], MD;  Location: [facility];  Service: Thoracic;  Laterality: Right;   PACEMAKER IMPLANT N/A 10/30/2017   SJM Assurity MRI PPM implanted by Dr [name] once leadless pacemaker battery depleted   PARTIAL HYSTERECTOMY     PERMANENT PACEMAKER INSERTION N/A 09/14/2012   Nanostim (SJM) leadless pacemaker (LEADLESS II STUDY PATIENT) implanted by Dr [name], no longer functioning, device abandoned.   VIDEO ASSISTED THORACOSCOPY (VATS)/WEDGE RESECTION Right 10/27/2012   Procedure: VIDEO ASSISTED THORACOSCOPY (VATS),RGHT UPPER LOBE LUNG WEDGE RESECTION;  Surgeon: [name], MD;  Location: [facility];  Service: Thoracic;  Laterality: Right;    REVIEW OF SYSTEMS:  Constitutional: positive for fatigue Eyes: negative Ears, nose, mouth, throat, and face: negative Respiratory: negative Cardiovascular: negative  Gastrointestinal: negative Genitourinary:negative Integument/breast: negative Hematologic/lymphatic: negative Musculoskeletal:negative Neurological: negative Behavioral/Psych: negative Endocrine: negative Allergic/Immunologic: negative   PHYSICAL EXAMINATION: General appearance: alert, cooperative, fatigued, and no distress Head: Normocephalic, without obvious abnormality, atraumatic Neck: no adenopathy, no JVD, supple, symmetrical, trachea midline, and thyroid not enlarged, symmetric, no tenderness/mass/nodules Lymph nodes: Cervical, supraclavicular, and axillary nodes normal. Resp: clear to auscultation bilaterally Back: symmetric, no curvature. ROM normal. No CVA tenderness. Cardio: regular rate and rhythm, S1, S2 normal, no murmur, click, rub or gallop GI: soft, non-tender; bowel sounds normal; no masses,  no organomegaly Extremities: extremities  normal, atraumatic, no cyanosis or edema Neurologic: Alert and oriented X 3, normal strength and tone. Normal symmetric reflexes. Normal coordination and gait  ECOG PERFORMANCE STATUS: 1 - Symptomatic but completely ambulatory  Blood pressure 137/62, pulse 62, temperature 98.4 F (36.9 C), temperature source Tympanic, resp. rate 15, weight 145 lb 2 oz (65.8 kg), SpO2 98 %.  LABORATORY DATA: Lab Results  Component Value Date   WBC 5.9 06/04/2021   HGB 13.4 06/04/2021   HCT 38.6 06/04/2021   MCV 96.0 06/04/2021   PLT 190 06/04/2021      Chemistry      Component Value Date/Time   NA 139 06/04/2021 0857   NA 139 08/05/2017 0902   K 4.4 06/04/2021 0857   K 4.1 08/05/2017 0902   CL 103 06/04/2021 0857   CO2 28 06/04/2021 0857   CO2 28 08/05/2017 0902   BUN 10 06/04/2021 0857   BUN 10.1 08/05/2017 0902   CREATININE 0.79 06/04/2021 0857   CREATININE 0.8 08/05/2017 0902   GLU 134 (H) 02/18/2017 1410      Component Value Date/Time   CALCIUM 9.5 06/04/2021 0857   CALCIUM 9.5 08/05/2017 0902   ALKPHOS 113 06/04/2021 0857   ALKPHOS 111 08/05/2017 0902   AST 50 (H) 06/04/2021 0857   AST 33 08/05/2017 0902   ALT 39 06/04/2021 0857   ALT 34 08/05/2017 0902   BILITOT 1.3 (H) 06/04/2021 0857   BILITOT 1.39 (H) 08/05/2017 0902       RADIOGRAPHIC STUDIES: CT Chest W Contrast  Result Date: 06/02/2021 CLINICAL DATA:  Non-small cell lung cancer restaging, status post right upper lobectomy, ongoing chemotherapy EXAM: CT CHEST, ABDOMEN, AND PELVIS WITH CONTRAST TECHNIQUE: Multidetector CT imaging of the chest, abdomen and pelvis was performed following the standard protocol during bolus administration of intravenous contrast. CONTRAST:  9mL OMNIPAQUE IOHEXOL 350 MG/ML SOLN, additional oral enteric contrast COMPARISON:  CT chest abdomen pelvis, 01/14/2021, PET-CT, 04/24/2016 FINDINGS: CT CHEST FINDINGS Cardiovascular: Right chest port catheter. Aortic atherosclerosis. Normal heart size.  Three-vessel coronary artery calcifications. No pericardial effusion. Mediastinum/Nodes: Slightly decreased size of subcarinal lymph node, measuring 1.7 x 1.1 cm, previously 2.1 x 1.4 cm. Unchanged post treatment soft tissue thickening about the right hilum. Thyroid gland, trachea, and esophagus demonstrate no significant findings. Lungs/Pleura: Mild centrilobular emphysema. Redemonstrated postoperative findings status post right upper lobectomy, with paramedian radiation fibrosis of the right lower lobe. Unchanged rounded 3 mm pulmonary nodule of the posterior left upper lobe (series 505, image 32). Background of very fine centrilobular nodules and scattered ground-glass opacities, most concentrated in the lung apices. No pleural effusion or pneumothorax. Musculoskeletal: No chest wall mass or suspicious bone lesions identified. CT ABDOMEN PELVIS FINDINGS Hepatobiliary: Coarse, nodular, cirrhotic morphology of the liver. Hepatic steatosis. No solid liver abnormality is seen. Multiple small gallstones in the gallbladder. No gallbladder wall thickening or biliary ductal dilatation. Pancreas: Unremarkable. No pancreatic ductal  dilatation or surrounding inflammatory changes. Spleen: Mild splenomegaly, maximum span 13.5 cm. Adrenals/Urinary Tract: Adrenal glands are unremarkable. Kidneys are normal, without renal calculi, solid lesion, or hydronephrosis. Bladder is unremarkable. Stomach/Bowel: Stomach is within normal limits. Appendix appears normal. No evidence of bowel wall thickening, distention, or inflammatory changes. Descending and sigmoid diverticulosis. Vascular/Lymphatic: Aortic atherosclerosis. Unchanged prominent portacaval and gastrohepatic ligament lymph nodes (series 504, image 58). Reproductive: Status post hysterectomy. Other: No abdominal wall hernia or abnormality. No abdominopelvic ascites. Musculoskeletal: No acute or significant osseous findings. IMPRESSION: 1. Redemonstrated postoperative findings  status post right upper lobectomy, with paramedian radiation fibrosis of the right lower lobe. 2. Slightly decreased size of a subcarinal lymph node, measuring 1.7 x 1.1 cm, previously 2.1 x 1.4 cm, consistent with treatment response of nodal metastatic disease. This lymph node was previously PET avid on PET-CT examination dated 04/24/2016. 3. Unchanged rounded 3 mm pulmonary nodule of the posterior left upper lobe, almost certainly benign and incidental sequelae of prior infection or inflammation. 4. Mild emphysema. Background of very fine centrilobular nodules and scattered ground-glass opacities, most concentrated in the lung apices, nonspecific and infectious or inflammatory, most commonly seen in smoking-related respiratory bronchiolitis. 5. Hepatic steatosis and cirrhosis.  Mild splenomegaly. 6. Unchanged prominent portacaval and gastrohepatic ligament lymph nodes, again likely secondary to cirrhosis, abdominal nodal metastatic disease not favored. 7. Cholelithiasis. 8. Coronary artery disease. Aortic Atherosclerosis (ICD10-I70.0) and Emphysema (ICD10-J43.9). Electronically Signed   By: Delanna Ahmadi M.D.   On: 06/02/2021 19:14   CT Abdomen Pelvis W Contrast  Result Date: 06/02/2021 CLINICAL DATA:  Non-small cell lung cancer restaging, status post right upper lobectomy, ongoing chemotherapy EXAM: CT CHEST, ABDOMEN, AND PELVIS WITH CONTRAST TECHNIQUE: Multidetector CT imaging of the chest, abdomen and pelvis was performed following the standard protocol during bolus administration of intravenous contrast. CONTRAST:  95mL OMNIPAQUE IOHEXOL 350 MG/ML SOLN, additional oral enteric contrast COMPARISON:  CT chest abdomen pelvis, 01/14/2021, PET-CT, 04/24/2016 FINDINGS: CT CHEST FINDINGS Cardiovascular: Right chest port catheter. Aortic atherosclerosis. Normal heart size. Three-vessel coronary artery calcifications. No pericardial effusion. Mediastinum/Nodes: Slightly decreased size of subcarinal lymph node,  measuring 1.7 x 1.1 cm, previously 2.1 x 1.4 cm. Unchanged post treatment soft tissue thickening about the right hilum. Thyroid gland, trachea, and esophagus demonstrate no significant findings. Lungs/Pleura: Mild centrilobular emphysema. Redemonstrated postoperative findings status post right upper lobectomy, with paramedian radiation fibrosis of the right lower lobe. Unchanged rounded 3 mm pulmonary nodule of the posterior left upper lobe (series 505, image 32). Background of very fine centrilobular nodules and scattered ground-glass opacities, most concentrated in the lung apices. No pleural effusion or pneumothorax. Musculoskeletal: No chest wall mass or suspicious bone lesions identified. CT ABDOMEN PELVIS FINDINGS Hepatobiliary: Coarse, nodular, cirrhotic morphology of the liver. Hepatic steatosis. No solid liver abnormality is seen. Multiple small gallstones in the gallbladder. No gallbladder wall thickening or biliary ductal dilatation. Pancreas: Unremarkable. No pancreatic ductal dilatation or surrounding inflammatory changes. Spleen: Mild splenomegaly, maximum span 13.5 cm. Adrenals/Urinary Tract: Adrenal glands are unremarkable. Kidneys are normal, without renal calculi, solid lesion, or hydronephrosis. Bladder is unremarkable. Stomach/Bowel: Stomach is within normal limits. Appendix appears normal. No evidence of bowel wall thickening, distention, or inflammatory changes. Descending and sigmoid diverticulosis. Vascular/Lymphatic: Aortic atherosclerosis. Unchanged prominent portacaval and gastrohepatic ligament lymph nodes (series 504, image 58). Reproductive: Status post hysterectomy. Other: No abdominal wall hernia or abnormality. No abdominopelvic ascites. Musculoskeletal: No acute or significant osseous findings. IMPRESSION: 1. Redemonstrated postoperative findings status post right upper  lobectomy, with paramedian radiation fibrosis of the right lower lobe. 2. Slightly decreased size of a subcarinal  lymph node, measuring 1.7 x 1.1 cm, previously 2.1 x 1.4 cm, consistent with treatment response of nodal metastatic disease. This lymph node was previously PET avid on PET-CT examination dated 04/24/2016. 3. Unchanged rounded 3 mm pulmonary nodule of the posterior left upper lobe, almost certainly benign and incidental sequelae of prior infection or inflammation. 4. Mild emphysema. Background of very fine centrilobular nodules and scattered ground-glass opacities, most concentrated in the lung apices, nonspecific and infectious or inflammatory, most commonly seen in smoking-related respiratory bronchiolitis. 5. Hepatic steatosis and cirrhosis.  Mild splenomegaly. 6. Unchanged prominent portacaval and gastrohepatic ligament lymph nodes, again likely secondary to cirrhosis, abdominal nodal metastatic disease not favored. 7. Cholelithiasis. 8. Coronary artery disease. Aortic Atherosclerosis (ICD10-I70.0) and Emphysema (ICD10-J43.9). Electronically Signed   By: Delanna Ahmadi M.D.   On: 06/02/2021 19:14    ASSESSMENT AND PLAN: This is a very pleasant 75 years old white female with metastatic non-small cell lung cancer, adenocarcinoma with no actionable mutations status post systemic chemotherapy with carboplatin and Alimta and she is currently undergoing treatment with second line immunotherapy with Nivolumab status post 6 cycles. This was followed by immunotherapy with Nivolumab 480 mg IV every 4 weeks status post 54 cycles. The patient has been tolerating this treatment well with no concerning adverse effects. She had repeat CT scan of the chest, abdomen and pelvis.  I personally and independently reviewed the scans and discussed the results with the patient today. Her scan showed no concerning findings for disease progression. I recommended for her to continue her current treatment with nivolumab 480 Mg IV every 4 weeks as planned.  She will proceed with cycle #55 today. I will see her back for follow-up visit  in 4 weeks for evaluation before the next cycle of her treatment. For the cardiac arrhythmia, she is followed by Dr. [name]. The patient was advised to call immediately if she has any other concerning symptoms in the interval. The patient voices understanding of current disease status and treatment options and is in agreement with the current care plan.  All questions were answered. The patient knows to call the clinic with any problems, questions or concerns. We can certainly see the patient much sooner if necessary.  The total time spent in the appointment was 30 minutes.  Disclaimer: This note was dictated with voice recognition software. Similar sounding words can inadvertently be transcribed and may not be corrected upon review.

## 2021-06-04 NOTE — Patient Instructions (Signed)
[facility] ONCOLOGY  Discharge Instructions: Thank you for choosing [facility] to provide your oncology and hematology care.   If you have a lab appointment with the [facility], please go directly to the [facility] and check in at the registration area.   Wear comfortable clothing and clothing appropriate for easy access to any Portacath or PICC line.   We strive to give you quality time with your provider. You may need to reschedule your appointment if you arrive late (15 or more minutes).  Arriving late affects you and other patients whose appointments are after yours.  Also, if you miss three or more appointments without notifying the office, you may be dismissed from the clinic at the provider's discretion.      For prescription refill requests, have your pharmacy contact our office and allow 72 hours for refills to be completed.    Today you received the following chemotherapy and/or immunotherapy agent: Nivolumab (Opdivo).   To help prevent nausea and vomiting after your treatment, we encourage you to take your nausea medication as directed.  BELOW ARE SYMPTOMS THAT SHOULD BE REPORTED IMMEDIATELY: *FEVER GREATER THAN 100.4 F (38 C) OR HIGHER *CHILLS OR SWEATING *NAUSEA AND VOMITING THAT IS NOT CONTROLLED WITH YOUR NAUSEA MEDICATION *UNUSUAL SHORTNESS OF BREATH *UNUSUAL BRUISING OR BLEEDING *URINARY PROBLEMS (pain or burning when urinating, or frequent urination) *BOWEL PROBLEMS (unusual diarrhea, constipation, pain near the anus) TENDERNESS IN MOUTH AND THROAT WITH OR WITHOUT PRESENCE OF ULCERS (sore throat, sores in mouth, or a toothache) UNUSUAL RASH, SWELLING OR PAIN  UNUSUAL VAGINAL DISCHARGE OR ITCHING   Items with * indicate a potential emergency and should be followed up as soon as possible or go to the Emergency Department if any problems should occur.  Please show the CHEMOTHERAPY ALERT CARD or IMMUNOTHERAPY ALERT CARD at  check-in to the Emergency Department and triage nurse.  Should you have questions after your visit or need to cancel or reschedule your appointment, please contact [facility]  Dept: [phone]  and follow the prompts.  Office hours are 8:00 a.m. to 4:30 p.m. Monday - Friday. Please note that voicemails left after 4:00 p.m. may not be returned until the following business day.  We are closed weekends and major holidays. You have access to a nurse at all times for urgent questions. Please call the main number to the clinic Dept: [phone] and follow the prompts.   For any non-urgent questions, you may also contact your provider using MyChart. We now offer e-Visits for anyone 50 and older to request care online for non-urgent symptoms. For details visit [email or web address].   Also download the MyChart app! Go to the app store, search "MyChart", open the app, select [facility], and log in with your MyChart username and password.  Due to Covid, a mask is required upon entering the hospital/clinic. If you do not have a mask, one will be given to you upon arrival. For doctor visits, patients may have 1 support person aged 73 or older with them. For treatment visits, patients cannot have anyone with them due to current Covid guidelines and our immunocompromised population.

## 2021-06-26 ENCOUNTER — Ambulatory Visit
Admission: RE | Admit: 2021-06-26 | Discharge: 2021-06-26 | Disposition: A | Discharge status: Home / Self Care | Payer: Medicare Other | Source: Ambulatory Visit | Attending: Internal Medicine | Admitting: Internal Medicine

## 2021-06-26 DIAGNOSIS — Z1231 Encounter for screening mammogram for malignant neoplasm of breast: Secondary | ICD-10-CM

## 2021-07-02 ENCOUNTER — Inpatient Hospital Stay: Payer: Medicare Other

## 2021-07-02 ENCOUNTER — Other Ambulatory Visit: Payer: Self-pay

## 2021-07-02 ENCOUNTER — Inpatient Hospital Stay (HOSPITAL_BASED_OUTPATIENT_CLINIC_OR_DEPARTMENT_OTHER): Payer: Medicare Other | Admitting: Internal Medicine

## 2021-07-02 VITALS — BP 132/62 | HR 69 | Temp 97.9°F | Resp 16 | Ht 62.0 in | Wt 144.0 lb

## 2021-07-02 DIAGNOSIS — C3490 Malignant neoplasm of unspecified part of unspecified bronchus or lung: Secondary | ICD-10-CM

## 2021-07-02 DIAGNOSIS — C3491 Malignant neoplasm of unspecified part of right bronchus or lung: Secondary | ICD-10-CM

## 2021-07-02 DIAGNOSIS — R5383 Other fatigue: Secondary | ICD-10-CM

## 2021-07-02 DIAGNOSIS — Z95828 Presence of other vascular implants and grafts: Secondary | ICD-10-CM

## 2021-07-02 DIAGNOSIS — Z5112 Encounter for antineoplastic immunotherapy: Secondary | ICD-10-CM | POA: Diagnosis not present

## 2021-07-02 LAB — CBC WITH DIFFERENTIAL (CANCER CENTER ONLY)
Abs Immature Granulocytes: 0.01 10*3/uL (ref 0.00–0.07)
Basophils Absolute: 0 10*3/uL (ref 0.0–0.1)
Basophils Relative: 1 %
Eosinophils Absolute: 0.2 10*3/uL (ref 0.0–0.5)
Eosinophils Relative: 4 %
HCT: 38.4 % (ref 36.0–46.0)
Hemoglobin: 13.6 g/dL (ref 12.0–15.0)
Immature Granulocytes: 0 %
Lymphocytes Relative: 28 %
Lymphs Abs: 1.4 10*3/uL (ref 0.7–4.0)
MCH: 33.5 pg (ref 26.0–34.0)
MCHC: 35.4 g/dL (ref 30.0–36.0)
MCV: 94.6 fL (ref 80.0–100.0)
Monocytes Absolute: 0.4 10*3/uL (ref 0.1–1.0)
Monocytes Relative: 9 %
Neutro Abs: 2.8 10*3/uL (ref 1.7–7.7)
Neutrophils Relative %: 58 %
Platelet Count: 177 10*3/uL (ref 150–400)
RBC: 4.06 MIL/uL (ref 3.87–5.11)
RDW: 12.5 % (ref 11.5–15.5)
WBC Count: 4.9 10*3/uL (ref 4.0–10.5)
nRBC: 0 % (ref 0.0–0.2)

## 2021-07-02 LAB — CMP (CANCER CENTER ONLY)
ALT: 42 U/L (ref 0–44)
AST: 52 U/L — ABNORMAL HIGH (ref 15–41)
Albumin: 4 g/dL (ref 3.5–5.0)
Alkaline Phosphatase: 117 U/L (ref 38–126)
Anion gap: 5 (ref 5–15)
BUN: 10 mg/dL (ref 8–23)
CO2: 27 mmol/L (ref 22–32)
Calcium: 9.1 mg/dL (ref 8.9–10.3)
Chloride: 105 mmol/L (ref 98–111)
Creatinine: 0.65 mg/dL (ref 0.44–1.00)
GFR, Estimated: >60 mL/min (ref >60)
Glucose, Bld: 173 mg/dL — ABNORMAL HIGH (ref 70–99)
Potassium: 4 mmol/L (ref 3.5–5.1)
Sodium: 137 mmol/L (ref 135–145)
Total Bilirubin: 1.4 mg/dL — ABNORMAL HIGH (ref 0.3–1.2)
Total Protein: 7.4 g/dL (ref 6.5–8.1)

## 2021-07-02 LAB — TSH: TSH: 5.351 u[IU]/mL — ABNORMAL HIGH (ref 0.350–4.500)

## 2021-07-02 MED ORDER — SODIUM CHLORIDE 0.9 % IV SOLN
480.0000 mg | Freq: Once | INTRAVENOUS | Status: AC
  Administered 2021-07-02: 480 mg via INTRAVENOUS
  Filled 2021-07-02: qty 48

## 2021-07-02 MED ORDER — HEPARIN SOD (PORK) LOCK FLUSH 100 UNIT/ML IV SOLN
500.0000 [IU] | Freq: Once | INTRAVENOUS | Status: AC | PRN
  Administered 2021-07-02: 500 [IU]

## 2021-07-02 MED ORDER — SODIUM CHLORIDE 0.9 % IV SOLN: Freq: Once | INTRAVENOUS | Status: AC

## 2021-07-02 MED ORDER — SODIUM CHLORIDE 0.9% FLUSH
10.0000 mL | INTRAVENOUS | Status: DC | PRN
  Administered 2021-07-02: 10 mL

## 2021-07-02 MED ORDER — SODIUM CHLORIDE 0.9% FLUSH
10.0000 mL | INTRAVENOUS | Status: DC | PRN
  Administered 2021-07-02: 10 mL via INTRAVENOUS

## 2021-07-02 NOTE — Patient Instructions (Signed)
[facility]COL[facility]  Discharge Instructions: Thank you for choosing [facility] to provide your oncology and hematology care.   If you have a lab appointment with the Cancer Center, please go directly to the Cancer Center and check in at the registration area.   Wear comfortable clothing and clothing appropriate for easy access to any Portacath or PICC line.   We strive to give you quality time with your provider. You may need to reschedule your appointment if you arrive late (15 or more minutes).  Arriving late affects you and other patients whose appointments are after yours.  Also, if you miss three or more appointments without notifying the office, you may be dismissed from the clinic at the provider's discretion.      For prescription refill requests, have your pharmacy contact our office and allow 72 hours for refills to be completed.    Today you received the following chemotherapy and/or immunotherapy agents: Nivolumab.      To help prevent nausea and vomiting after your treatment, we encourage you to take your nausea medication as directed.  BELOW ARE SYMPTOMS THAT SHOULD BE REPORTED IMMEDIATELY: *FEVER GREATER THAN 100.4 F (38 C) OR HIGHER *CHILLS OR SWEATING *NAUSEA AND VOMITING THAT IS NOT CONTROLLED WITH YOUR NAUSEA MEDICATION *UNUSUAL SHORTNESS OF BREATH *UNUSUAL BRUISING OR BLEEDING *URINARY PROBLEMS (pain or burning when urinating, or frequent urination) *BOWEL PROBLEMS (unusual diarrhea, constipation, pain near the anus) TENDERNESS IN MOUTH AND THROAT WITH OR WITHOUT PRESENCE OF ULCERS (sore throat, sores in mouth, or a toothache) UNUSUAL RASH, SWELLING OR PAIN  UNUSUAL VAGINAL DISCHARGE OR ITCHING   Items with * indicate a potential emergency and should be followed up as soon as possible or go to the Emergency Department if any problems should occur.  Please show the CHEMOTHERAPY ALERT CARD or IMMUNOTHERAPY ALERT CARD at check-in to  the Emergency Department and triage nurse.  Should you have questions after your visit or need to cancel or reschedule your appointment, please contact [facility]  Dept: [phone]  and follow the prompts.  Office hours are 8:00 a.m. to 4:30 p.m. Monday - Friday. Please note that voicemails left after 4:00 p.m. may not be returned until the following business day.  We are closed weekends and major holidays. You have access to a nurse at all times for urgent questions. Please call the main number to the clinic Dept: [phone] and follow the prompts.   For any non-urgent questions, you may also contact your provider using MyChart. We now offer e-Visits for anyone 18 and older to request care online for non-urgent symptoms. For details visit [email or web address].   Also download the MyChart app! Go to the app store, search "MyChart", open the app, select , and log in with your MyChart username and password.  Due to Covid, a mask is required upon entering the hospital/clinic. If you do not have a mask, one will be given to you upon arrival. For doctor visits, patients may have 1 support person aged 18 or older with them. For treatment visits, patients cannot have anyone with them due to current Covid guidelines and our immunocompromised population.   

## 2021-07-02 NOTE — Progress Notes (Signed)
[facility] [phone]   [phone]  OFFICE PROGRESS NOTE  Harris, [name], [name] [address] [place] [address]  DIAGNOSIS: Metastatic non-small cell lung cancer, adenocarcinoma. This was initially diagnosed as stage IB (T2a, N0, M0) in March of 2014, status post right upper lobectomy with lymph node dissection but the patient has evidence for disease recurrence in the right axilla status post resection.  PRIOR THERAPY:  1) Status post right axillary lymph node biopsy on 05/01/2014 under the care of Dr. Roxan Hockey. 2) Systemic chemotherapy with carboplatin for AUC of 5 and Alimta 500 mg/M2 every 3 weeks. First cycle 05/31/2014. Status post 6 cycles. Last dose was given 09/22/2014. 3) palliative radiotherapy to the subcarinal lymph node under the care of Dr. Sondra Come. 4) Nivolumab 240 mg IV every 2 weeks. First dose 12/23/2016. Status post 6 cycles.  CURRENT THERAPY:  Nivolumab 480 mg IV every 4 weeks. First dose 03/17/2017. Status post 55 cycles.  INTERVAL HISTORY: Nancy Hodges 75 y.o. female returns to the clinic today for follow-up visit.  The patient is feeling fine today with no concerning complaints.  She denied having any current chest pain, shortness of breath, cough or hemoptysis.  She has no nausea, vomiting, diarrhea or constipation.  She has no headache or visual changes.  She denied having any significant weight loss or night sweats.  The patient is here today for evaluation before starting cycle #56.  MEDICAL HISTORY: Past Medical History:  Diagnosis Date   Allergic rhinitis    Anxiety    Arthritis    Asthma    Atrial flutter ([place])    Cholelithiases 09/26/2015   Diverticulosis    Encounter for antineoplastic immunotherapy 12/09/2016   GERD (gastroesophageal reflux disease)    H/O hiatal hernia    History of blood transfusion    History of chemotherapy    History of kidney stones    History of radiation therapy     Internal hemorrhoid    Lung cancer ([place])    a. Stage IB non-small cell carcinoma, s/p R VATS, wedge resection, RU lobectomy 10/2012.   Persistent atrial fibrillation ([facility])    a. anticoagulated with Xarelto   Pneumonia 06/2016   [facility]   Presence of permanent cardiac pacemaker    Pulmonary nodule    Tachycardia-bradycardia syndrome ([place])    a. 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    ALLERGIES:  is allergic to chlorpheniramine-dm, prednisone, rofecoxib, septra [sulfamethoxazole-trimethoprim], molds & smuts, and penicillins.  MEDICATIONS:  Current Outpatient Medications  Medication Sig Dispense Refill   acetaminophen (TYLENOL) 500 MG tablet Take 500 mg by mouth 3 (three) times daily as needed for moderate pain or fever.      Acetaminophen-DM 1000-30 MG/30ML LIQD      albuterol (PROVENTIL HFA;VENTOLIN HFA) 108 (90 BASE) MCG/ACT inhaler Inhale 2 puffs into the lungs every 6 (six) hours as needed for wheezing or shortness of breath.      azelastine (ASTELIN) 0.1 % nasal spray Place 1 spray into both nostrils 2 (two) times daily. Use in each nostril as directed     calcium carbonate (TUMS - DOSED IN MG ELEMENTAL CALCIUM) 500 MG chewable tablet Chew 1 tablet by mouth 2 (two) times daily as needed for indigestion or heartburn.      diltiazem (CARTIA XT) 240 MG 24 hr capsule Take 1 capsule (240 mg total) by mouth daily. 90 capsule 3   dimenhyDRINATE (DRAMAMINE) 50  MG tablet Take 12.5 mg by mouth every 8 (eight) hours as needed for dizziness.     ipratropium-albuterol (DUONEB) 0.5-2.5 (3) MG/3ML SOLN Take 3 mLs by nebulization every 6 (six) hours as needed (shortness of breath or wheezing).      levothyroxine (SYNTHROID) 50 MCG tablet TAKE 1 TABLET(50 MCG) BY MOUTH DAILY BEFORE AND BREAKFAST 90 tablet 0   lidocaine-prilocaine (EMLA) cream APPLY TOPICALLY TO TO THE AFFECTED AREA AS NEEDED 30 g 4   loratadine (CLARITIN) 10 MG tablet Take 10 mg by mouth daily.     OVER THE COUNTER  MEDICATION Take 5 mLs by mouth daily as needed (cough). [employer] kids cough syrup     Polyvinyl Alcohol-Povidone (REFRESH OP) Place 1 drop into both eyes daily as needed (dry eyes).     XARELTO 20 MG TABS tablet TAKE 1 TABLET(20 MG) BY MOUTH DAILY WITH SUPPER 30 tablet 5   No current facility-administered medications for this visit.   Facility-Administered Medications Ordered in Other Visits  Medication Dose Route Frequency Provider Last Rate Last Admin   sodium chloride flush (NS) 0.9 % injection 10 mL  10 mL Intravenous PRN Curt Bears, MD   10 mL at 07/02/21 0920    SURGICAL HISTORY:  Past Surgical History:  Procedure Laterality Date   CARDIOVERSION N/A 02/19/2018   Procedure: CARDIOVERSION;  Surgeon: Sanda Klein, MD;  Location: [facility];  Service: Cardiovascular;  Laterality: N/A;   CARDIOVERSION N/A 04/30/2018   Procedure: CARDIOVERSION;  Surgeon: Thayer Headings, MD;  Location: [facility];  Service: Cardiovascular;  Laterality: N/A;   CATARACT EXTRACTION W/PHACO Right 08/14/2016   Procedure: CATARACT EXTRACTION PHACO AND INTRAOCULAR LENS PLACEMENT RIGHT EYE CDE 10.53;  Surgeon: Tonny Branch, MD;  Location: [facility];  Service: Ophthalmology;  Laterality: Right;  right   CATARACT EXTRACTION W/PHACO Left 09/25/2016   Procedure: CATARACT EXTRACTION PHACO AND INTRAOCULAR LENS PLACEMENT (IOC);  Surgeon: Tonny Branch, MD;  Location: [facility];  Service: Ophthalmology;  Laterality: Left;  left CDE 8.81   COLONOSCOPY     [facility]: cattered sigmoid diverticula, internal hemorrhoids, sessile polyp in ascending and mid transverse colon (tubular adenoma).    colonoscopy with polypectomy  2015   ESOPHAGOGASTRODUODENOSCOPY     [facility]: bile reflux in stomach, mild gastritis, hiatal hernia   ESOPHAGOGASTRODUODENOSCOPY N/A 01/31/2016   Procedure: ESOPHAGOGASTRODUODENOSCOPY (EGD);  Surgeon: [name], MD;  [facility];  Service: Endoscopy;  Laterality: N/A;  1200   IR FLUORO  GUIDE PORT INSERTION RIGHT  06/30/2017   IR US GUIDE VASC ACCESS RIGHT  06/30/2017   LOBECTOMY Right 10/27/2012   Procedure: LOBECTOMY;  Surgeon: [name], MD;  Location: [facility];  Service: Thoracic;  Laterality: Right;   RIGHT UPPER LOBECTOMY & Node Dissection   LYMPH NODE BIOPSY Right 05/01/2014   Procedure: LYMPH NODE BIOPSY, Right Axillary;  Surgeon: [name], MD;  Location: [facility];  Service: Thoracic;  Laterality: Right;   PACEMAKER IMPLANT N/A 10/30/2017   SJM Assurity MRI PPM implanted by Dr [name] once leadless pacemaker battery depleted   PARTIAL HYSTERECTOMY     PERMANENT PACEMAKER INSERTION N/A 09/14/2012   Nanostim (SJM) leadless pacemaker (LEADLESS II STUDY PATIENT) implanted by Dr [name], no longer functioning, device abandoned.   VIDEO ASSISTED THORACOSCOPY (VATS)/WEDGE RESECTION Right 10/27/2012   Procedure: VIDEO ASSISTED THORACOSCOPY (VATS),RGHT UPPER LOBE LUNG WEDGE RESECTION;  Surgeon: [name], MD;  Location: [facility];  Service: Thoracic;  Laterality: Right;  REVIEW OF SYSTEMS:  A comprehensive review of systems was negative except for: Constitutional: positive for fatigue   PHYSICAL EXAMINATION: General appearance: alert, cooperative, fatigued, and no distress Head: Normocephalic, without obvious abnormality, atraumatic Neck: no adenopathy, no JVD, supple, symmetrical, trachea midline, and thyroid not enlarged, symmetric, no tenderness/mass/nodules Lymph nodes: Cervical, supraclavicular, and axillary nodes normal. Resp: clear to auscultation bilaterally Back: symmetric, no curvature. ROM normal. No CVA tenderness. Cardio: regular rate and rhythm, S1, S2 normal, no murmur, click, rub or gallop GI: soft, non-tender; bowel sounds normal; no masses,  no organomegaly Extremities: extremities normal, atraumatic, no cyanosis or edema  ECOG PERFORMANCE STATUS: 1 - Symptomatic but completely ambulatory  Blood pressure 132/62, pulse 69, temperature  97.9 F (36.6 C), temperature source Temporal, resp. rate 16, height 5\' 2"  (1.575 m), weight 144 lb (65.3 kg), SpO2 100 %.  LABORATORY DATA: Lab Results  Component Value Date   WBC 5.9 06/04/2021   HGB 13.4 06/04/2021   HCT 38.6 06/04/2021   MCV 96.0 06/04/2021   PLT 190 06/04/2021      Chemistry      Component Value Date/Time   NA 139 06/04/2021 0857   NA 139 08/05/2017 0902   K 4.4 06/04/2021 0857   K 4.1 08/05/2017 0902   CL 103 06/04/2021 0857   CO2 28 06/04/2021 0857   CO2 28 08/05/2017 0902   BUN 10 06/04/2021 0857   BUN 10.1 08/05/2017 0902   CREATININE 0.79 06/04/2021 0857   CREATININE 0.8 08/05/2017 0902   GLU 134 (H) 02/18/2017 1410      Component Value Date/Time   CALCIUM 9.5 06/04/2021 0857   CALCIUM 9.5 08/05/2017 0902   ALKPHOS 113 06/04/2021 0857   ALKPHOS 111 08/05/2017 0902   AST 50 (H) 06/04/2021 0857   AST 33 08/05/2017 0902   ALT 39 06/04/2021 0857   ALT 34 08/05/2017 0902   BILITOT 1.3 (H) 06/04/2021 0857   BILITOT 1.39 (H) 08/05/2017 0902       RADIOGRAPHIC STUDIES: MM 3D SCREEN BREAST BILATERAL  Result Date: 06/26/2021 CLINICAL DATA:  Screening. EXAM: DIGITAL SCREENING BILATERAL MAMMOGRAM WITH TOMOSYNTHESIS AND CAD TECHNIQUE: Bilateral screening digital craniocaudal and mediolateral oblique mammograms were obtained. Bilateral screening digital breast tomosynthesis was performed. The images were evaluated with computer-aided detection. COMPARISON:  Previous exam(s). ACR Breast Density Category b: There are scattered areas of fibroglandular density. FINDINGS: There are no findings suspicious for malignancy. IMPRESSION: No mammographic evidence of malignancy. A result letter of this screening mammogram will be mailed directly to the patient. RECOMMENDATION: Screening mammogram in one year. (Code:SM-B-01Y) BI-RADS CATEGORY  1: Negative. Electronically Signed   By: Marin Olp M.D.   On: 06/26/2021 12:55    ASSESSMENT AND PLAN: This is a very  pleasant 75 years old white female with metastatic non-small cell lung cancer, adenocarcinoma with no actionable mutations status post systemic chemotherapy with carboplatin and Alimta and she is currently undergoing treatment with second line immunotherapy with Nivolumab status post 6 cycles. This was followed by immunotherapy with Nivolumab 480 mg IV every 4 weeks status post 55 cycles. The patient continues to tolerate her treatment with nivolumab fairly well. I recommended for her to proceed with cycle #56 today as planned. I will see her back for follow-up visit in 4 weeks for evaluation before the next cycle of her treatment. She was advised to call immediately if she has any other concerning symptoms in the interval For the cardiac arrhythmia, she is followed by  Dr. [name].  The patient voices understanding of current disease status and treatment options and is in agreement with the current care plan.  All questions were answered. The patient knows to call the clinic with any problems, questions or concerns. We can certainly see the patient much sooner if necessary.  The total time spent in the appointment was 30 minutes.  Disclaimer: This note was dictated with voice recognition software. Similar sounding words can inadvertently be transcribed and may not be corrected upon review.

## 2021-07-30 ENCOUNTER — Inpatient Hospital Stay: Payer: Medicare Other | Attending: Internal Medicine

## 2021-07-30 ENCOUNTER — Encounter: Payer: Self-pay | Admitting: Internal Medicine

## 2021-07-30 ENCOUNTER — Encounter: Payer: Self-pay | Admitting: *Deleted

## 2021-07-30 ENCOUNTER — Other Ambulatory Visit: Payer: Self-pay

## 2021-07-30 ENCOUNTER — Inpatient Hospital Stay (HOSPITAL_BASED_OUTPATIENT_CLINIC_OR_DEPARTMENT_OTHER): Payer: Medicare Other | Admitting: Internal Medicine

## 2021-07-30 ENCOUNTER — Inpatient Hospital Stay: Payer: Medicare Other

## 2021-07-30 VITALS — BP 135/64 | HR 67 | Temp 97.9°F | Resp 16 | Wt 144.0 lb

## 2021-07-30 DIAGNOSIS — C3491 Malignant neoplasm of unspecified part of right bronchus or lung: Secondary | ICD-10-CM

## 2021-07-30 DIAGNOSIS — C3411 Malignant neoplasm of upper lobe, right bronchus or lung: Secondary | ICD-10-CM | POA: Insufficient documentation

## 2021-07-30 DIAGNOSIS — I499 Cardiac arrhythmia, unspecified: Secondary | ICD-10-CM | POA: Insufficient documentation

## 2021-07-30 DIAGNOSIS — Z5112 Encounter for antineoplastic immunotherapy: Secondary | ICD-10-CM

## 2021-07-30 DIAGNOSIS — C3490 Malignant neoplasm of unspecified part of unspecified bronchus or lung: Secondary | ICD-10-CM

## 2021-07-30 DIAGNOSIS — C773 Secondary and unspecified malignant neoplasm of axilla and upper limb lymph nodes: Secondary | ICD-10-CM | POA: Diagnosis present

## 2021-07-30 DIAGNOSIS — Z95828 Presence of other vascular implants and grafts: Secondary | ICD-10-CM

## 2021-07-30 DIAGNOSIS — R5383 Other fatigue: Secondary | ICD-10-CM

## 2021-07-30 LAB — CBC WITH DIFFERENTIAL (CANCER CENTER ONLY)
Abs Immature Granulocytes: 0.01 10*3/uL (ref 0.00–0.07)
Basophils Absolute: 0 10*3/uL (ref 0.0–0.1)
Basophils Relative: 1 %
Eosinophils Absolute: 0.2 10*3/uL (ref 0.0–0.5)
Eosinophils Relative: 4 %
HCT: 38.4 % (ref 36.0–46.0)
Hemoglobin: 13.4 g/dL (ref 12.0–15.0)
Immature Granulocytes: 0 %
Lymphocytes Relative: 27 %
Lymphs Abs: 1.4 10*3/uL (ref 0.7–4.0)
MCH: 33.3 pg (ref 26.0–34.0)
MCHC: 34.9 g/dL (ref 30.0–36.0)
MCV: 95.5 fL (ref 80.0–100.0)
Monocytes Absolute: 0.4 10*3/uL (ref 0.1–1.0)
Monocytes Relative: 9 %
Neutro Abs: 3 10*3/uL (ref 1.7–7.7)
Neutrophils Relative %: 59 %
Platelet Count: 177 10*3/uL (ref 150–400)
RBC: 4.02 MIL/uL (ref 3.87–5.11)
RDW: 12.5 % (ref 11.5–15.5)
WBC Count: 5 10*3/uL (ref 4.0–10.5)
nRBC: 0 % (ref 0.0–0.2)

## 2021-07-30 LAB — TSH: TSH: 4.851 u[IU]/mL — ABNORMAL HIGH (ref 0.308–3.960)

## 2021-07-30 LAB — CMP (CANCER CENTER ONLY)
ALT: 35 U/L (ref 0–44)
AST: 47 U/L — ABNORMAL HIGH (ref 15–41)
Albumin: 4 g/dL (ref 3.5–5.0)
Alkaline Phosphatase: 109 U/L (ref 38–126)
Anion gap: 6 (ref 5–15)
BUN: 9 mg/dL (ref 8–23)
CO2: 29 mmol/L (ref 22–32)
Calcium: 9.6 mg/dL (ref 8.9–10.3)
Chloride: 104 mmol/L (ref 98–111)
Creatinine: 0.75 mg/dL (ref 0.44–1.00)
GFR, Estimated: >60 mL/min (ref >60)
Glucose, Bld: 169 mg/dL — ABNORMAL HIGH (ref 70–99)
Potassium: 4.3 mmol/L (ref 3.5–5.1)
Sodium: 139 mmol/L (ref 135–145)
Total Bilirubin: 1.4 mg/dL — ABNORMAL HIGH (ref 0.3–1.2)
Total Protein: 7 g/dL (ref 6.5–8.1)

## 2021-07-30 MED ORDER — HEPARIN SOD (PORK) LOCK FLUSH 100 UNIT/ML IV SOLN
500.0000 [IU] | Freq: Once | INTRAVENOUS | Status: AC | PRN
  Administered 2021-07-30: 12:00:00 500 [IU]

## 2021-07-30 MED ORDER — SODIUM CHLORIDE 0.9% FLUSH
10.0000 mL | INTRAVENOUS | Status: DC | PRN
  Administered 2021-07-30: 12:00:00 10 mL

## 2021-07-30 MED ORDER — SODIUM CHLORIDE 0.9 % IV SOLN
480.0000 mg | Freq: Once | INTRAVENOUS | Status: AC
  Administered 2021-07-30: 12:00:00 480 mg via INTRAVENOUS
  Filled 2021-07-30: qty 48

## 2021-07-30 MED ORDER — SODIUM CHLORIDE 0.9 % IV SOLN: Freq: Once | INTRAVENOUS | Status: AC

## 2021-07-30 MED ORDER — SODIUM CHLORIDE 0.9% FLUSH
10.0000 mL | INTRAVENOUS | Status: DC | PRN
  Administered 2021-07-30: 10:00:00 10 mL via INTRAVENOUS

## 2021-07-30 NOTE — Patient Instructions (Signed)
[facility]  Discharge Instructions: Thank you for choosing [facility] to provide your oncology and hematology care.   If you have a lab appointment with the Cancer Center, please go directly to the Cancer Center and check in at the registration area.   Wear comfortable clothing and clothing appropriate for easy access to any Portacath or PICC line.   We strive to give you quality time with your provider. You may need to reschedule your appointment if you arrive late (15 or more minutes).  Arriving late affects you and other patients whose appointments are after yours.  Also, if you miss three or more appointments without notifying the office, you may be dismissed from the clinic at the provider's discretion.      For prescription refill requests, have your pharmacy contact our office and allow 72 hours for refills to be completed.    Today you received the following chemotherapy and/or immunotherapy agents: Nivolumab.      To help prevent nausea and vomiting after your treatment, we encourage you to take your nausea medication as directed.  BELOW ARE SYMPTOMS THAT SHOULD BE REPORTED IMMEDIATELY: *FEVER GREATER THAN 100.4 F (38 C) OR HIGHER *CHILLS OR SWEATING *NAUSEA AND VOMITING THAT IS NOT CONTROLLED WITH YOUR NAUSEA MEDICATION *UNUSUAL SHORTNESS OF BREATH *UNUSUAL BRUISING OR BLEEDING *URINARY PROBLEMS (pain or burning when urinating, or frequent urination) *BOWEL PROBLEMS (unusual diarrhea, constipation, pain near the anus) TENDERNESS IN MOUTH AND THROAT WITH OR WITHOUT PRESENCE OF ULCERS (sore throat, sores in mouth, or a toothache) UNUSUAL RASH, SWELLING OR PAIN  UNUSUAL VAGINAL DISCHARGE OR ITCHING   Items with * indicate a potential emergency and should be followed up as soon as possible or go to the Emergency Department if any problems should occur.  Please show the CHEMOTHERAPY ALERT CARD or IMMUNOTHERAPY ALERT CARD at check-in to  the Emergency Department and triage nurse.  Should you have questions after your visit or need to cancel or reschedule your appointment, please contact [facility]  Dept: [phone]  and follow the prompts.  Office hours are 8:00 a.m. to 4:30 p.m. Monday - Friday. Please note that voicemails left after 4:00 p.m. may not be returned until the following business day.  We are closed weekends and major holidays. You have access to a nurse at all times for urgent questions. Please call the main number to the clinic Dept: [phone] and follow the prompts.   For any non-urgent questions, you may also contact your provider using MyChart. We now offer e-Visits for anyone 18 and older to request care online for non-urgent symptoms. For details visit [email or web address].   Also download the MyChart app! Go to the app store, search "MyChart", open the app, select [facility], and log in with your MyChart username and password.  Due to Covid, a mask is required upon entering the hospital/clinic. If you do not have a mask, one will be given to you upon arrival. For doctor visits, patients may have 1 support person aged 18 or older with them. For treatment visits, patients cannot have anyone with them due to current Covid guidelines and our immunocompromised population.   

## 2021-07-30 NOTE — Progress Notes (Signed)
Oncology Nurse Navigator Documentation  Oncology Nurse Navigator Flowsheets 07/30/2021 11/22/2019 08/02/2019 10/27/2018 07/30/2018 07/27/2018 05/11/2018  Abnormal Finding Date - - 09/15/2012 - - - -  Confirmed Diagnosis Date - - 10/27/2012 - - - -  Diagnosis Status - - Confirmed Diagnosis Complete - - - -  Planned Course of Treatment - - Chemotherapy;Surgery;Targeted Therapy - - - -  Phase of Treatment - - Targeted Therapy - - - -  Chemotherapy Actual Start Date: - - 05/31/2014 - - - -  Chemotherapy Actual End Date: - - 09/22/2014 - - - -  Targeted Therapy Actual Start Date: - - 12/23/2016 - - - -  Surgery Actual Start Date: - - 10/27/2012 - - - -  Navigator Follow Up Date: - - 08/02/2019 - - - -  Navigator Follow Up Reason: - - Follow-up Appointment - - - -  Navigation Complete Date: - - 08/02/2019 - - - -  Post Navigation: Continue to Follow Patient? - - Yes - - - -  Navigator Location [facility]  Navigator Encounter Type Clinic/MDC Clinic/MDC Clinic/MDC Telephone Telephone Telephone Clinic/MDC  Telephone - - - Outgoing Call [facility] Call [facility] Call -  Treatment Initiated Date - - 05/31/2014 - - - -  Patient Visit Type MedOnc MedOnc MedOnc - - - MedOnc  Treatment Phase Treatment Treatment Treatment Treatment Treatment Treatment Treatment  Barriers/Navigation Needs - Education No Barriers At This Time Education Education No Needs Education  Education - Other - Other Other - Other  Interventions Psycho-Social Support Education;Psycho-Social Support None Required Education Education None required Education  Acuity Level 3-Moderate Needs (3-4 Barriers Identified) Level 2-Minimal Needs (1-2 Barriers Identified) Level 1-No Barriers Level 1 Level 1 Level 1 Level 1  Coordination of Care - - - - - - -  Education Method - - - Verbal Verbal - -  Time Spent with Patient 30 15 30 30 15 15  15

## 2021-07-30 NOTE — Progress Notes (Signed)
[place] Telephone:(336) [phone]   Fax:(336) [phone]  OFFICE PROGRESS NOTE  Harris, [name], [name] [address] [place] [address]  DIAGNOSIS: Metastatic non-small cell lung cancer, adenocarcinoma. This was initially diagnosed as stage IB (T2a, N0, M0) in March of 2014, status post right upper lobectomy with lymph node dissection but the patient has evidence for disease recurrence in the right axilla status post resection.  PRIOR THERAPY:  1) Status post right axillary lymph node biopsy on 05/01/2014 under the care of Dr. Roxan Hockey. 2) Systemic chemotherapy with carboplatin for AUC of 5 and Alimta 500 mg/M2 every 3 weeks. First cycle 05/31/2014. Status post 6 cycles. Last dose was given 09/22/2014. 3) palliative radiotherapy to the subcarinal lymph node under the care of Dr. Sondra Come. 4) Nivolumab 240 mg IV every 2 weeks. First dose 12/23/2016. Status post 6 cycles.  CURRENT THERAPY:  Nivolumab 480 mg IV every 4 weeks. First dose 03/17/2017. Status post 56 cycles.  INTERVAL HISTORY: Nancy Hodges 75 y.o. female returns to the clinic today for follow-up visit.  The patient is feeling fine today with no concerning complaints.  She denied having any current chest pain, shortness of breath, cough or hemoptysis.  She denied having any fever or chills.  She has no nausea, vomiting, diarrhea or constipation.  She has no headache or visual changes.  She has no recent weight loss or night sweats.  She continues to tolerate her treatment with nivolumab fairly well.  She is here for evaluation before starting cycle #57.  MEDICAL HISTORY: Past Medical History:  Diagnosis Date   Allergic rhinitis    Anxiety    Arthritis    Asthma    Atrial flutter ([place])    Cholelithiases 09/26/2015   Diverticulosis    Encounter for antineoplastic immunotherapy 12/09/2016   GERD (gastroesophageal reflux disease)    H/O hiatal hernia    History of blood transfusion    History  of chemotherapy    History of kidney stones    History of radiation therapy    Internal hemorrhoid    Lung cancer ([place])    a. Stage IB non-small cell carcinoma, s/p R VATS, wedge resection, RU lobectomy 10/2012.   Persistent atrial fibrillation ([facility])    a. anticoagulated with Xarelto   Pneumonia 06/2016   [facility]   Presence of permanent cardiac pacemaker    Pulmonary nodule    Tachycardia-bradycardia syndrome ([place])    a. 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    ALLERGIES:  is allergic to chlorpheniramine-dm, prednisone, rofecoxib, septra [sulfamethoxazole-trimethoprim], molds & smuts, and penicillins.  MEDICATIONS:  Current Outpatient Medications  Medication Sig Dispense Refill   acetaminophen (TYLENOL) 500 MG tablet Take 500 mg by mouth 3 (three) times daily as needed for moderate pain or fever.      Acetaminophen-DM 1000-30 MG/30ML LIQD      albuterol (PROVENTIL HFA;VENTOLIN HFA) 108 (90 BASE) MCG/ACT inhaler Inhale 2 puffs into the lungs every 6 (six) hours as needed for wheezing or shortness of breath.      azelastine (ASTELIN) 0.1 % nasal spray Place 1 spray into both nostrils 2 (two) times daily. Use in each nostril as directed     calcium carbonate (TUMS - DOSED IN MG ELEMENTAL CALCIUM) 500 MG chewable tablet Chew 1 tablet by mouth 2 (two) times daily as needed for indigestion or heartburn.      diltiazem (CARTIA XT) 240 MG 24 hr capsule Take  1 capsule (240 mg total) by mouth daily. 90 capsule 3   dimenhyDRINATE (DRAMAMINE) 50 MG tablet Take 12.5 mg by mouth every 8 (eight) hours as needed for dizziness.     ipratropium-albuterol (DUONEB) 0.5-2.5 (3) MG/3ML SOLN Take 3 mLs by nebulization every 6 (six) hours as needed (shortness of breath or wheezing).      levothyroxine (SYNTHROID) 50 MCG tablet TAKE 1 TABLET(50 MCG) BY MOUTH DAILY BEFORE AND BREAKFAST 90 tablet 0   lidocaine-prilocaine (EMLA) cream APPLY TOPICALLY TO TO THE AFFECTED AREA AS NEEDED 30 g 4    loratadine (CLARITIN) 10 MG tablet Take 10 mg by mouth daily.     loratadine (CLARITIN) 10 MG tablet Take 10 mg by mouth daily.     meclizine (ANTIVERT) 12.5 MG tablet 1 tablet as needed     OVER THE COUNTER MEDICATION Take 5 mLs by mouth daily as needed (cough). [employer] kids cough syrup     Polyvinyl Alcohol-Povidone (REFRESH OP) Place 1 drop into both eyes daily as needed (dry eyes).     XARELTO 20 MG TABS tablet TAKE 1 TABLET(20 MG) BY MOUTH DAILY WITH SUPPER 30 tablet 5   No current facility-administered medications for this visit.   Facility-Administered Medications Ordered in Other Visits  Medication Dose Route Frequency Provider Last Rate Last Admin   sodium chloride flush (NS) 0.9 % injection 10 mL  10 mL Intravenous PRN Curt Bears, MD   10 mL at 07/30/21 0940    SURGICAL HISTORY:  Past Surgical History:  Procedure Laterality Date   CARDIOVERSION N/A 02/19/2018   Procedure: CARDIOVERSION;  Surgeon: Sanda Klein, MD;  Location: [facility];  Service: Cardiovascular;  Laterality: N/A;   CARDIOVERSION N/A 04/30/2018   Procedure: CARDIOVERSION;  Surgeon: Thayer Headings, MD;  Location: [facility];  Service: Cardiovascular;  Laterality: N/A;   CATARACT EXTRACTION W/PHACO Right 08/14/2016   Procedure: CATARACT EXTRACTION PHACO AND INTRAOCULAR LENS PLACEMENT RIGHT EYE CDE 10.53;  Surgeon: Tonny Branch, MD;  Location: [facility];  Service: Ophthalmology;  Laterality: Right;  right   CATARACT EXTRACTION W/PHACO Left 09/25/2016   Procedure: CATARACT EXTRACTION PHACO AND INTRAOCULAR LENS PLACEMENT (IOC);  Surgeon: Tonny Branch, MD;  Location: [facility];  Service: Ophthalmology;  Laterality: Left;  left CDE 8.81   COLONOSCOPY     [facility]: cattered sigmoid diverticula, internal hemorrhoids, sessile polyp in ascending and mid transverse colon (tubular adenoma).    colonoscopy with polypectomy  2015   ESOPHAGOGASTRODUODENOSCOPY     [facility]: bile reflux in stomach, mild gastritis, hiatal  hernia   ESOPHAGOGASTRODUODENOSCOPY N/A 01/31/2016   Procedure: ESOPHAGOGASTRODUODENOSCOPY (EGD);  Surgeon: [name], MD;  [facility];  Service: Endoscopy;  Laterality: N/A;  1200   IR FLUORO GUIDE PORT INSERTION RIGHT  06/30/2017   IR US GUIDE VASC ACCESS RIGHT  06/30/2017   LOBECTOMY Right 10/27/2012   Procedure: LOBECTOMY;  Surgeon: [name], MD;  Location: [facility];  Service: Thoracic;  Laterality: Right;   RIGHT UPPER LOBECTOMY & Node Dissection   LYMPH NODE BIOPSY Right 05/01/2014   Procedure: LYMPH NODE BIOPSY, Right Axillary;  Surgeon: [name], MD;  Location: [facility];  Service: Thoracic;  Laterality: Right;   PACEMAKER IMPLANT N/A 10/30/2017   SJM Assurity MRI PPM implanted by Dr [name] once leadless pacemaker battery depleted   PARTIAL HYSTERECTOMY     PERMANENT PACEMAKER INSERTION N/A 09/14/2012   Nanostim (SJM) leadless pacemaker (LEADLESS II STUDY PATIENT) implanted by Dr [name], no  longer functioning, device abandoned.   VIDEO ASSISTED THORACOSCOPY (VATS)/WEDGE RESECTION Right 10/27/2012   Procedure: VIDEO ASSISTED THORACOSCOPY (VATS),RGHT UPPER LOBE LUNG WEDGE RESECTION;  Surgeon: [name], MD;  Location: [facility];  Service: Thoracic;  Laterality: Right;    REVIEW OF SYSTEMS:  A comprehensive review of systems was negative.   PHYSICAL EXAMINATION: General appearance: alert, cooperative, and no distress Head: Normocephalic, without obvious abnormality, atraumatic Neck: no adenopathy, no JVD, supple, symmetrical, trachea midline, and thyroid not enlarged, symmetric, no tenderness/mass/nodules Lymph nodes: Cervical, supraclavicular, and axillary nodes normal. Resp: clear to auscultation bilaterally Back: symmetric, no curvature. ROM normal. No CVA tenderness. Cardio: regular rate and rhythm, S1, S2 normal, no murmur, click, rub or gallop GI: soft, non-tender; bowel sounds normal; no masses,  no organomegaly Extremities: extremities  normal, atraumatic, no cyanosis or edema  ECOG PERFORMANCE STATUS: 1 - Symptomatic but completely ambulatory  Blood pressure 135/64, pulse 67, temperature 97.9 F (36.6 C), temperature source Tympanic, resp. rate 16, weight 144 lb (65.3 kg), SpO2 98 %.  LABORATORY DATA: Lab Results  Component Value Date   WBC 4.9 07/02/2021   HGB 13.6 07/02/2021   HCT 38.4 07/02/2021   MCV 94.6 07/02/2021   PLT 177 07/02/2021      Chemistry      Component Value Date/Time   NA 137 07/02/2021 0920   NA 139 08/05/2017 0902   K 4.0 07/02/2021 0920   K 4.1 08/05/2017 0902   CL 105 07/02/2021 0920   CO2 27 07/02/2021 0920   CO2 28 08/05/2017 0902   BUN 10 07/02/2021 0920   BUN 10.1 08/05/2017 0902   CREATININE 0.65 07/02/2021 0920   CREATININE 0.8 08/05/2017 0902   GLU 134 (H) 02/18/2017 1410      Component Value Date/Time   CALCIUM 9.1 07/02/2021 0920   CALCIUM 9.5 08/05/2017 0902   ALKPHOS 117 07/02/2021 0920   ALKPHOS 111 08/05/2017 0902   AST 52 (H) 07/02/2021 0920   AST 33 08/05/2017 0902   ALT 42 07/02/2021 0920   ALT 34 08/05/2017 0902   BILITOT 1.4 (H) 07/02/2021 0920   BILITOT 1.39 (H) 08/05/2017 0902       RADIOGRAPHIC STUDIES: No results found.  ASSESSMENT AND PLAN: This is a very pleasant 75 years old white female with metastatic non-small cell lung cancer, adenocarcinoma with no actionable mutations status post systemic chemotherapy with carboplatin and Alimta and she is currently undergoing treatment with second line immunotherapy with Nivolumab status post 6 cycles. This was followed by immunotherapy with Nivolumab 480 mg IV every 4 weeks status post 56 cycles. The patient has been tolerating this treatment well with no concerning adverse effects. I recommended for her to proceed with cycle #57 today as planned. I will see her back for follow-up visit in 4 weeks for evaluation before the next cycle of her treatment.  I would consider repeating her imaging studies after  the next cycle of her treatment. For the cardiac arrhythmia, she is followed by Dr. [name]. She was advised to call immediately if she has any other concerning symptoms in the interval. The patient voices understanding of current disease status and treatment options and is in agreement with the current care plan.  All questions were answered. The patient knows to call the clinic with any problems, questions or concerns. We can certainly see the patient much sooner if necessary.  Disclaimer: This note was dictated with voice recognition software. Similar sounding words can inadvertently be transcribed  and may not be corrected upon review.

## 2021-07-31 ENCOUNTER — Ambulatory Visit (INDEPENDENT_AMBULATORY_CARE_PROVIDER_SITE_OTHER): Payer: Medicare Other

## 2021-07-31 DIAGNOSIS — I495 Sick sinus syndrome: Secondary | ICD-10-CM | POA: Diagnosis not present

## 2021-07-31 LAB — CUP PACEART REMOTE DEVICE CHECK
Battery Remaining Longevity: 88 mo
Battery Remaining Percentage: 69 %
Battery Voltage: 3.01 V
Brady Statistic RV Percent Paced: 35 %
Date Time Interrogation Session: 20221228021858
Implantable Lead Implant Date: 20190329
Implantable Lead Implant Date: 20190329
Implantable Lead Location: 753859
Implantable Lead Location: 753860
Implantable Pulse Generator Implant Date: 20190329
Lead Channel Impedance Value: 600 Ohm
Lead Channel Pacing Threshold Amplitude: 0.75 V
Lead Channel Pacing Threshold Pulse Width: 0.5 ms
Lead Channel Sensing Intrinsic Amplitude: 12 mV
Lead Channel Setting Pacing Amplitude: 2.5 V
Lead Channel Setting Pacing Pulse Width: 0.5 ms
Lead Channel Setting Sensing Sensitivity: 2 mV
Pulse Gen Model: 2272
Pulse Gen Serial Number: 9003317

## 2021-08-12 NOTE — Progress Notes (Signed)
Remote pacemaker transmission.   

## 2021-08-26 ENCOUNTER — Other Ambulatory Visit: Payer: Self-pay | Admitting: Medical Oncology

## 2021-08-26 DIAGNOSIS — C3490 Malignant neoplasm of unspecified part of unspecified bronchus or lung: Secondary | ICD-10-CM

## 2021-08-27 ENCOUNTER — Other Ambulatory Visit: Payer: Self-pay

## 2021-08-27 ENCOUNTER — Inpatient Hospital Stay: Payer: Medicare Other

## 2021-08-27 ENCOUNTER — Inpatient Hospital Stay: Payer: Medicare Other | Attending: Internal Medicine

## 2021-08-27 ENCOUNTER — Inpatient Hospital Stay (HOSPITAL_BASED_OUTPATIENT_CLINIC_OR_DEPARTMENT_OTHER): Payer: Medicare Other | Admitting: Internal Medicine

## 2021-08-27 ENCOUNTER — Encounter: Payer: Self-pay | Admitting: Internal Medicine

## 2021-08-27 VITALS — BP 134/66 | HR 70 | Temp 97.4°F | Resp 18 | Ht 62.0 in | Wt 143.4 lb

## 2021-08-27 DIAGNOSIS — Z79899 Other long term (current) drug therapy: Secondary | ICD-10-CM | POA: Diagnosis not present

## 2021-08-27 DIAGNOSIS — C3411 Malignant neoplasm of upper lobe, right bronchus or lung: Secondary | ICD-10-CM | POA: Insufficient documentation

## 2021-08-27 DIAGNOSIS — Z5112 Encounter for antineoplastic immunotherapy: Secondary | ICD-10-CM | POA: Diagnosis present

## 2021-08-27 DIAGNOSIS — C3491 Malignant neoplasm of unspecified part of right bronchus or lung: Secondary | ICD-10-CM

## 2021-08-27 DIAGNOSIS — Z95828 Presence of other vascular implants and grafts: Secondary | ICD-10-CM

## 2021-08-27 DIAGNOSIS — C3490 Malignant neoplasm of unspecified part of unspecified bronchus or lung: Secondary | ICD-10-CM

## 2021-08-27 DIAGNOSIS — C773 Secondary and unspecified malignant neoplasm of axilla and upper limb lymph nodes: Secondary | ICD-10-CM | POA: Insufficient documentation

## 2021-08-27 LAB — CBC WITH DIFFERENTIAL (CANCER CENTER ONLY)
Abs Immature Granulocytes: 0.01 10*3/uL (ref 0.00–0.07)
Basophils Absolute: 0.1 10*3/uL (ref 0.0–0.1)
Basophils Relative: 1 %
Eosinophils Absolute: 0.3 10*3/uL (ref 0.0–0.5)
Eosinophils Relative: 3 %
HCT: 41.4 % (ref 36.0–46.0)
Hemoglobin: 14.4 g/dL (ref 12.0–15.0)
Immature Granulocytes: 0 %
Lymphocytes Relative: 15 %
Lymphs Abs: 1.2 10*3/uL (ref 0.7–4.0)
MCH: 33 pg (ref 26.0–34.0)
MCHC: 34.8 g/dL (ref 30.0–36.0)
MCV: 94.7 fL (ref 80.0–100.0)
Monocytes Absolute: 0.5 10*3/uL (ref 0.1–1.0)
Monocytes Relative: 7 %
Neutro Abs: 5.8 10*3/uL (ref 1.7–7.7)
Neutrophils Relative %: 74 %
Platelet Count: 208 10*3/uL (ref 150–400)
RBC: 4.37 MIL/uL (ref 3.87–5.11)
RDW: 12.6 % (ref 11.5–15.5)
WBC Count: 7.9 10*3/uL (ref 4.0–10.5)
nRBC: 0 % (ref 0.0–0.2)

## 2021-08-27 LAB — CMP (CANCER CENTER ONLY)
ALT: 33 U/L (ref 0–44)
AST: 43 U/L — ABNORMAL HIGH (ref 15–41)
Albumin: 4 g/dL (ref 3.5–5.0)
Alkaline Phosphatase: 107 U/L (ref 38–126)
Anion gap: 6 (ref 5–15)
BUN: 16 mg/dL (ref 8–23)
CO2: 29 mmol/L (ref 22–32)
Calcium: 9.7 mg/dL (ref 8.9–10.3)
Chloride: 104 mmol/L (ref 98–111)
Creatinine: 0.76 mg/dL (ref 0.44–1.00)
GFR, Estimated: >60 mL/min (ref >60)
Glucose, Bld: 170 mg/dL — ABNORMAL HIGH (ref 70–99)
Potassium: 4.7 mmol/L (ref 3.5–5.1)
Sodium: 139 mmol/L (ref 135–145)
Total Bilirubin: 1.2 mg/dL (ref 0.3–1.2)
Total Protein: 7.5 g/dL (ref 6.5–8.1)

## 2021-08-27 MED ORDER — HEPARIN SOD (PORK) LOCK FLUSH 100 UNIT/ML IV SOLN
500.0000 [IU] | Freq: Once | INTRAVENOUS | Status: AC | PRN
  Administered 2021-08-27: 13:00:00 500 [IU]

## 2021-08-27 MED ORDER — SODIUM CHLORIDE 0.9% FLUSH
10.0000 mL | INTRAVENOUS | Status: DC | PRN
  Administered 2021-08-27: 10:00:00 10 mL via INTRAVENOUS

## 2021-08-27 MED ORDER — SODIUM CHLORIDE 0.9% FLUSH
10.0000 mL | INTRAVENOUS | Status: DC | PRN
  Administered 2021-08-27: 13:00:00 10 mL

## 2021-08-27 MED ORDER — SODIUM CHLORIDE 0.9 % IV SOLN
480.0000 mg | Freq: Once | INTRAVENOUS | Status: AC
  Administered 2021-08-27: 12:00:00 480 mg via INTRAVENOUS
  Filled 2021-08-27: qty 48

## 2021-08-27 MED ORDER — SODIUM CHLORIDE 0.9 % IV SOLN: Freq: Once | INTRAVENOUS | Status: AC

## 2021-08-27 NOTE — Patient Instructions (Signed)
[facility]  Discharge Instructions: Thank you for choosing [facility] to provide your oncology and hematology care.   If you have a lab appointment with the Cancer Center, please go directly to the Cancer Center and check in at the registration area.   Wear comfortable clothing and clothing appropriate for easy access to any Portacath or PICC line.   We strive to give you quality time with your provider. You may need to reschedule your appointment if you arrive late (15 or more minutes).  Arriving late affects you and other patients whose appointments are after yours.  Also, if you miss three or more appointments without notifying the office, you may be dismissed from the clinic at the provider's discretion.      For prescription refill requests, have your pharmacy contact our office and allow 72 hours for refills to be completed.    Today you received the following chemotherapy and/or immunotherapy agents: Nivolumab.      To help prevent nausea and vomiting after your treatment, we encourage you to take your nausea medication as directed.  BELOW ARE SYMPTOMS THAT SHOULD BE REPORTED IMMEDIATELY: *FEVER GREATER THAN 100.4 F (38 C) OR HIGHER *CHILLS OR SWEATING *NAUSEA AND VOMITING THAT IS NOT CONTROLLED WITH YOUR NAUSEA MEDICATION *UNUSUAL SHORTNESS OF BREATH *UNUSUAL BRUISING OR BLEEDING *URINARY PROBLEMS (pain or burning when urinating, or frequent urination) *BOWEL PROBLEMS (unusual diarrhea, constipation, pain near the anus) TENDERNESS IN MOUTH AND THROAT WITH OR WITHOUT PRESENCE OF ULCERS (sore throat, sores in mouth, or a toothache) UNUSUAL RASH, SWELLING OR PAIN  UNUSUAL VAGINAL DISCHARGE OR ITCHING   Items with * indicate a potential emergency and should be followed up as soon as possible or go to the Emergency Department if any problems should occur.  Please show the CHEMOTHERAPY ALERT CARD or IMMUNOTHERAPY ALERT CARD at check-in to  the Emergency Department and triage nurse.  Should you have questions after your visit or need to cancel or reschedule your appointment, please contact [facility]  Dept: [phone]  and follow the prompts.  Office hours are 8:00 a.m. to 4:30 p.m. Monday - Friday. Please note that voicemails left after 4:00 p.m. may not be returned until the following business day.  We are closed weekends and major holidays. You have access to a nurse at all times for urgent questions. Please call the main number to the clinic Dept: [phone] and follow the prompts.   For any non-urgent questions, you may also contact your provider using MyChart. We now offer e-Visits for anyone 18 and older to request care online for non-urgent symptoms. For details visit [email or web address].   Also download the MyChart app! Go to the app store, search "MyChart", open the app, select [facility], and log in with your MyChart username and password.  Due to Covid, a mask is required upon entering the hospital/clinic. If you do not have a mask, one will be given to you upon arrival. For doctor visits, patients may have 1 support person aged 18 or older with them. For treatment visits, patients cannot have anyone with them due to current Covid guidelines and our immunocompromised population.   

## 2021-08-27 NOTE — Progress Notes (Signed)
[place] Telephone:(336) [phone]   Fax:(336) [phone]  OFFICE PROGRESS NOTE  Harris, [name], [name] [address] [place] [address]  DIAGNOSIS: Metastatic non-small cell lung cancer, adenocarcinoma. This was initially diagnosed as stage IB (T2a, N0, M0) in March of 2014, status post right upper lobectomy with lymph node dissection but the patient has evidence for disease recurrence in the right axilla status post resection.  PRIOR THERAPY:  1) Status post right axillary lymph node biopsy on 05/01/2014 under the care of Dr. Roxan Hockey. 2) Systemic chemotherapy with carboplatin for AUC of 5 and Alimta 500 mg/M2 every 3 weeks. First cycle 05/31/2014. Status post 6 cycles. Last dose was given 09/22/2014. 3) palliative radiotherapy to the subcarinal lymph node under the care of Dr. Sondra Come. 4) Nivolumab 240 mg IV every 2 weeks. First dose 12/23/2016. Status post 6 cycles.  CURRENT THERAPY:  Nivolumab 480 mg IV every 4 weeks. First dose 03/17/2017. Status post 57 cycles.  INTERVAL HISTORY: Nancy Hodges 76 y.o. female returns to the clinic today for follow-up visit.  The patient is feeling fine today with no concerning complaints except for mild skin rash on her right wrist.  She denied having any current chest pain, shortness of breath, cough or hemoptysis.  She denied having any fever or chills.  She has no nausea, vomiting, diarrhea or constipation.  She has no headache or visual changes.  She continues to tolerate her treatment with nivolumab fairly well.  The patient is here today for evaluation before starting cycle #58.  MEDICAL HISTORY: Past Medical History:  Diagnosis Date   Allergic rhinitis    Anxiety    Arthritis    Asthma    Atrial flutter ([place])    Cholelithiases 09/26/2015   Diverticulosis    Encounter for antineoplastic immunotherapy 12/09/2016   GERD (gastroesophageal reflux disease)    H/O hiatal hernia    History of blood transfusion     History of chemotherapy    History of kidney stones    History of radiation therapy    Internal hemorrhoid    Lung cancer ([place])    a. Stage IB non-small cell carcinoma, s/p R VATS, wedge resection, RU lobectomy 10/2012.   Persistent atrial fibrillation ([facility])    a. anticoagulated with Xarelto   Pneumonia 06/2016   [facility]   Presence of permanent cardiac pacemaker    Pulmonary nodule    Tachycardia-bradycardia syndrome ([place])    a. 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    ALLERGIES:  is allergic to chlorpheniramine-dm, prednisone, rofecoxib, septra [sulfamethoxazole-trimethoprim], molds & smuts, and penicillins.  MEDICATIONS:  Current Outpatient Medications  Medication Sig Dispense Refill   acetaminophen (TYLENOL) 500 MG tablet Take 500 mg by mouth 3 (three) times daily as needed for moderate pain or fever.      Acetaminophen-DM 1000-30 MG/30ML LIQD      albuterol (PROVENTIL HFA;VENTOLIN HFA) 108 (90 BASE) MCG/ACT inhaler Inhale 2 puffs into the lungs every 6 (six) hours as needed for wheezing or shortness of breath.      azelastine (ASTELIN) 0.1 % nasal spray Place 1 spray into both nostrils 2 (two) times daily. Use in each nostril as directed     calcium carbonate (TUMS - DOSED IN MG ELEMENTAL CALCIUM) 500 MG chewable tablet Chew 1 tablet by mouth 2 (two) times daily as needed for indigestion or heartburn.      diltiazem (CARTIA XT) 240 MG 24 hr capsule  Take 1 capsule (240 mg total) by mouth daily. 90 capsule 3   dimenhyDRINATE (DRAMAMINE) 50 MG tablet Take 12.5 mg by mouth every 8 (eight) hours as needed for dizziness.     ipratropium-albuterol (DUONEB) 0.5-2.5 (3) MG/3ML SOLN Take 3 mLs by nebulization every 6 (six) hours as needed (shortness of breath or wheezing).      levothyroxine (SYNTHROID) 50 MCG tablet TAKE 1 TABLET(50 MCG) BY MOUTH DAILY BEFORE AND BREAKFAST 90 tablet 0   lidocaine-prilocaine (EMLA) cream APPLY TOPICALLY TO TO THE AFFECTED AREA AS NEEDED 30 g 4    loratadine (CLARITIN) 10 MG tablet Take 10 mg by mouth daily.     meclizine (ANTIVERT) 12.5 MG tablet 1 tablet as needed     OVER THE COUNTER MEDICATION Take 5 mLs by mouth daily as needed (cough). [employer] kids cough syrup     Polyvinyl Alcohol-Povidone (REFRESH OP) Place 1 drop into both eyes daily as needed (dry eyes).     XARELTO 20 MG TABS tablet TAKE 1 TABLET(20 MG) BY MOUTH DAILY WITH SUPPER 30 tablet 5   No current facility-administered medications for this visit.    SURGICAL HISTORY:  Past Surgical History:  Procedure Laterality Date   CARDIOVERSION N/A 02/19/2018   Procedure: CARDIOVERSION;  Surgeon: Sanda Klein, MD;  Location: [facility];  Service: Cardiovascular;  Laterality: N/A;   CARDIOVERSION N/A 04/30/2018   Procedure: CARDIOVERSION;  Surgeon: Thayer Headings, MD;  Location: [facility];  Service: Cardiovascular;  Laterality: N/A;   CATARACT EXTRACTION W/PHACO Right 08/14/2016   Procedure: CATARACT EXTRACTION PHACO AND INTRAOCULAR LENS PLACEMENT RIGHT EYE CDE 10.53;  Surgeon: Tonny Branch, MD;  [facility];  Service: Ophthalmology;  Laterality: Right;  right   CATARACT EXTRACTION W/PHACO Left 09/25/2016   Procedure: CATARACT EXTRACTION PHACO AND INTRAOCULAR LENS PLACEMENT (IOC);  Surgeon: Tonny Branch, MD;  [facility];  Service: Ophthalmology;  Laterality: Left;  left CDE 8.81   COLONOSCOPY     [facility]: cattered sigmoid diverticula, internal hemorrhoids, sessile polyp in ascending and mid transverse colon (tubular adenoma).    colonoscopy with polypectomy  2015   ESOPHAGOGASTRODUODENOSCOPY     [facility]: bile reflux in stomach, mild gastritis, hiatal hernia   ESOPHAGOGASTRODUODENOSCOPY N/A 01/31/2016   Procedure: ESOPHAGOGASTRODUODENOSCOPY (EGD);  Surgeon: [name], MD;  [facility];  Service: Endoscopy;  Laterality: N/A;  1200   IR FLUORO GUIDE PORT INSERTION RIGHT  06/30/2017   IR US GUIDE VASC ACCESS RIGHT  06/30/2017   LOBECTOMY Right  10/27/2012   Procedure: LOBECTOMY;  Surgeon: [name], MD;  Location: [facility];  Service: Thoracic;  Laterality: Right;   RIGHT UPPER LOBECTOMY & Node Dissection   LYMPH NODE BIOPSY Right 05/01/2014   Procedure: LYMPH NODE BIOPSY, Right Axillary;  Surgeon: [name], MD;  Location: [facility];  Service: Thoracic;  Laterality: Right;   PACEMAKER IMPLANT N/A 10/30/2017   SJM Assurity MRI PPM implanted by Dr [name] once leadless pacemaker battery depleted   PARTIAL HYSTERECTOMY     PERMANENT PACEMAKER INSERTION N/A 09/14/2012   Nanostim (SJM) leadless pacemaker (LEADLESS II STUDY PATIENT) implanted by Dr [name], no longer functioning, device abandoned.   VIDEO ASSISTED THORACOSCOPY (VATS)/WEDGE RESECTION Right 10/27/2012   Procedure: VIDEO ASSISTED THORACOSCOPY (VATS),RGHT UPPER LOBE LUNG WEDGE RESECTION;  Surgeon: [name], MD;  Location: [facility];  Service: Thoracic;  Laterality: Right;    REVIEW OF SYSTEMS:  A comprehensive review of systems was negative except for: Integument/breast:  positive for rash   PHYSICAL EXAMINATION: General appearance: alert, cooperative, and no distress Head: Normocephalic, without obvious abnormality, atraumatic Neck: no adenopathy, no JVD, supple, symmetrical, trachea midline, and thyroid not enlarged, symmetric, no tenderness/mass/nodules Lymph nodes: Cervical, supraclavicular, and axillary nodes normal. Resp: clear to auscultation bilaterally Back: symmetric, no curvature. ROM normal. No CVA tenderness. Cardio: regular rate and rhythm, S1, S2 normal, no murmur, click, rub or gallop GI: soft, non-tender; bowel sounds normal; no masses,  no organomegaly Extremities: extremities normal, atraumatic, no cyanosis or edema  ECOG PERFORMANCE STATUS: 1 - Symptomatic but completely ambulatory  Blood pressure 134/66, pulse 70, temperature (!) 97.4 F (36.3 C), temperature source Tympanic, resp. rate 18, height 5\' 2"  (1.575 m), weight 143 lb 6.4 oz  (65 kg), SpO2 97 %.  LABORATORY DATA: Lab Results  Component Value Date   WBC 7.9 08/27/2021   HGB 14.4 08/27/2021   HCT 41.4 08/27/2021   MCV 94.7 08/27/2021   PLT 208 08/27/2021      Chemistry      Component Value Date/Time   NA 139 07/30/2021 0933   NA 139 08/05/2017 0902   K 4.3 07/30/2021 0933   K 4.1 08/05/2017 0902   CL 104 07/30/2021 0933   CO2 29 07/30/2021 0933   CO2 28 08/05/2017 0902   BUN 9 07/30/2021 0933   BUN 10.1 08/05/2017 0902   CREATININE 0.75 07/30/2021 0933   CREATININE 0.8 08/05/2017 0902   GLU 134 (H) 02/18/2017 1410      Component Value Date/Time   CALCIUM 9.6 07/30/2021 0933   CALCIUM 9.5 08/05/2017 0902   ALKPHOS 109 07/30/2021 0933   ALKPHOS 111 08/05/2017 0902   AST 47 (H) 07/30/2021 0933   AST 33 08/05/2017 0902   ALT 35 07/30/2021 0933   ALT 34 08/05/2017 0902   BILITOT 1.4 (H) 07/30/2021 0933   BILITOT 1.39 (H) 08/05/2017 0902       RADIOGRAPHIC STUDIES: CUP PACEART REMOTE DEVICE CHECK  Result Date: 07/31/2021 Scheduled remote reviewed. Normal device function.  Next remote 91 days. LR   ASSESSMENT AND PLAN: This is a very pleasant 76 years old white female with metastatic non-small cell lung cancer, adenocarcinoma with no actionable mutations status post systemic chemotherapy with carboplatin and Alimta and she is currently undergoing treatment with second line immunotherapy with Nivolumab status post 6 cycles. This was followed by immunotherapy with Nivolumab 480 mg IV every 4 weeks status post 57 cycles. The patient continues to tolerate this treatment well with no concerning adverse effects but she has few spots of skin rash on her right wrist. I recommended for her to proceed with cycle #58 today as planned. I will see her back for follow-up visit in 4 weeks for evaluation with repeat CT scan of the chest, abdomen and pelvis for restaging of her disease. For the mild skin rash, I recommended for the patient to apply  hydrocortisone cream to the affected area. For the cardiac arrhythmia, she is followed by Dr. [name]. The patient was advised to call immediately if she has any other concerning symptoms in the interval. The patient voices understanding of current disease status and treatment options and is in agreement with the current care plan.  All questions were answered. The patient knows to call the clinic with any problems, questions or concerns. We can certainly see the patient much sooner if necessary.  Disclaimer: This note was dictated with voice recognition software. Similar sounding words can inadvertently be transcribed and  may not be corrected upon review.

## 2021-09-19 ENCOUNTER — Other Ambulatory Visit: Payer: Self-pay

## 2021-09-19 ENCOUNTER — Inpatient Hospital Stay: Payer: Medicare Other

## 2021-09-19 ENCOUNTER — Inpatient Hospital Stay: Payer: Medicare Other | Attending: Internal Medicine

## 2021-09-19 ENCOUNTER — Ambulatory Visit (HOSPITAL_COMMUNITY)
Admission: RE | Admit: 2021-09-19 | Discharge: 2021-09-19 | Disposition: A | Discharge status: Home / Self Care | Payer: Medicare Other | Source: Ambulatory Visit | Attending: Internal Medicine | Admitting: Internal Medicine

## 2021-09-19 DIAGNOSIS — C773 Secondary and unspecified malignant neoplasm of axilla and upper limb lymph nodes: Secondary | ICD-10-CM | POA: Insufficient documentation

## 2021-09-19 DIAGNOSIS — Z950 Presence of cardiac pacemaker: Secondary | ICD-10-CM | POA: Insufficient documentation

## 2021-09-19 DIAGNOSIS — R21 Rash and other nonspecific skin eruption: Secondary | ICD-10-CM | POA: Diagnosis not present

## 2021-09-19 DIAGNOSIS — C3490 Malignant neoplasm of unspecified part of unspecified bronchus or lung: Secondary | ICD-10-CM | POA: Diagnosis present

## 2021-09-19 DIAGNOSIS — I499 Cardiac arrhythmia, unspecified: Secondary | ICD-10-CM | POA: Insufficient documentation

## 2021-09-19 DIAGNOSIS — Z95828 Presence of other vascular implants and grafts: Secondary | ICD-10-CM

## 2021-09-19 DIAGNOSIS — Z902 Acquired absence of lung [part of]: Secondary | ICD-10-CM | POA: Diagnosis not present

## 2021-09-19 DIAGNOSIS — Z79899 Other long term (current) drug therapy: Secondary | ICD-10-CM | POA: Diagnosis not present

## 2021-09-19 DIAGNOSIS — C3411 Malignant neoplasm of upper lobe, right bronchus or lung: Secondary | ICD-10-CM | POA: Diagnosis present

## 2021-09-19 LAB — CMP (CANCER CENTER ONLY)
ALT: 36 U/L (ref 0–44)
AST: 42 U/L — ABNORMAL HIGH (ref 15–41)
Albumin: 4 g/dL (ref 3.5–5.0)
Alkaline Phosphatase: 122 U/L (ref 38–126)
Anion gap: 5 (ref 5–15)
BUN: 9 mg/dL (ref 8–23)
CO2: 28 mmol/L (ref 22–32)
Calcium: 9.1 mg/dL (ref 8.9–10.3)
Chloride: 104 mmol/L (ref 98–111)
Creatinine: 0.6 mg/dL (ref 0.44–1.00)
GFR, Estimated: >60 mL/min (ref >60)
Glucose, Bld: 158 mg/dL — ABNORMAL HIGH (ref 70–99)
Potassium: 4.2 mmol/L (ref 3.5–5.1)
Sodium: 137 mmol/L (ref 135–145)
Total Bilirubin: 1.3 mg/dL — ABNORMAL HIGH (ref 0.3–1.2)
Total Protein: 7.2 g/dL (ref 6.5–8.1)

## 2021-09-19 LAB — CBC WITH DIFFERENTIAL (CANCER CENTER ONLY)
Abs Immature Granulocytes: 0.03 10*3/uL (ref 0.00–0.07)
Basophils Absolute: 0 10*3/uL (ref 0.0–0.1)
Basophils Relative: 1 %
Eosinophils Absolute: 0.3 10*3/uL (ref 0.0–0.5)
Eosinophils Relative: 5 %
HCT: 38.4 % (ref 36.0–46.0)
Hemoglobin: 13.4 g/dL (ref 12.0–15.0)
Immature Granulocytes: 1 %
Lymphocytes Relative: 18 %
Lymphs Abs: 0.9 10*3/uL (ref 0.7–4.0)
MCH: 33.3 pg (ref 26.0–34.0)
MCHC: 34.9 g/dL (ref 30.0–36.0)
MCV: 95.5 fL (ref 80.0–100.0)
Monocytes Absolute: 0.4 10*3/uL (ref 0.1–1.0)
Monocytes Relative: 7 %
Neutro Abs: 3.4 10*3/uL (ref 1.7–7.7)
Neutrophils Relative %: 68 %
Platelet Count: 165 10*3/uL (ref 150–400)
RBC: 4.02 MIL/uL (ref 3.87–5.11)
RDW: 13 % (ref 11.5–15.5)
WBC Count: 5 10*3/uL (ref 4.0–10.5)
nRBC: 0 % (ref 0.0–0.2)

## 2021-09-19 MED ORDER — IOHEXOL 300 MG/ML  SOLN
100.0000 mL | Freq: Once | INTRAMUSCULAR | Status: AC | PRN
  Administered 2021-09-19: 100 mL via INTRAVENOUS

## 2021-09-19 MED ORDER — SODIUM CHLORIDE 0.9% FLUSH
10.0000 mL | INTRAVENOUS | Status: DC | PRN
  Administered 2021-09-19: 10 mL via INTRAVENOUS

## 2021-09-19 MED ORDER — HEPARIN SOD (PORK) LOCK FLUSH 100 UNIT/ML IV SOLN
500.0000 [IU] | Freq: Once | INTRAVENOUS | Status: AC | PRN
  Administered 2021-09-19: 500 [IU] via INTRAVENOUS

## 2021-09-19 MED ORDER — SODIUM CHLORIDE (PF) 0.9 % IJ SOLN
INTRAMUSCULAR | Status: AC
  Filled 2021-09-19: qty 50

## 2021-09-20 ENCOUNTER — Other Ambulatory Visit: Payer: Medicare Other

## 2021-09-24 ENCOUNTER — Other Ambulatory Visit: Payer: Self-pay

## 2021-09-24 ENCOUNTER — Other Ambulatory Visit: Payer: Medicare Other

## 2021-09-24 ENCOUNTER — Encounter: Payer: Self-pay | Admitting: *Deleted

## 2021-09-24 ENCOUNTER — Inpatient Hospital Stay (HOSPITAL_BASED_OUTPATIENT_CLINIC_OR_DEPARTMENT_OTHER): Payer: Medicare Other | Admitting: Internal Medicine

## 2021-09-24 ENCOUNTER — Inpatient Hospital Stay: Payer: Medicare Other

## 2021-09-24 VITALS — BP 132/69 | HR 61 | Temp 97.3°F | Resp 16 | Wt 143.3 lb

## 2021-09-24 DIAGNOSIS — C773 Secondary and unspecified malignant neoplasm of axilla and upper limb lymph nodes: Secondary | ICD-10-CM | POA: Diagnosis not present

## 2021-09-24 DIAGNOSIS — C3491 Malignant neoplasm of unspecified part of right bronchus or lung: Secondary | ICD-10-CM | POA: Diagnosis not present

## 2021-09-24 DIAGNOSIS — Z5112 Encounter for antineoplastic immunotherapy: Secondary | ICD-10-CM

## 2021-09-24 DIAGNOSIS — C3490 Malignant neoplasm of unspecified part of unspecified bronchus or lung: Secondary | ICD-10-CM

## 2021-09-24 MED ORDER — HYDROCORTISONE 1 % EX LOTN: 1.0000 "application " | TOPICAL_LOTION | Freq: Two times a day (BID) | CUTANEOUS | 0 refills | Status: DC

## 2021-09-24 NOTE — Progress Notes (Signed)
[place] [phone]   [phone]  OFFICE PROGRESS NOTE  Harris, [name], [name] [address] [place] [address]  DIAGNOSIS: Metastatic non-small cell lung cancer, adenocarcinoma. This was initially diagnosed as stage IB (T2a, N0, M0) in March of 2014, status post right upper lobectomy with lymph node dissection but the patient has evidence for disease recurrence in the right axilla status post resection.  PRIOR THERAPY:  1) Status post right axillary lymph node biopsy on 05/01/2014 under the care of Dr. Roxan Hockey. 2) Systemic chemotherapy with carboplatin for AUC of 5 and Alimta 500 mg/M2 every 3 weeks. First cycle 05/31/2014. Status post 6 cycles. Last dose was given 09/22/2014. 3) palliative radiotherapy to the subcarinal lymph node under the care of Dr. Sondra Come. 4) Nivolumab 240 mg IV every 2 weeks. First dose 12/23/2016. Status post 6 cycles.  CURRENT THERAPY:  Nivolumab 480 mg IV every 4 weeks. First dose 03/17/2017. Status post 58 cycles.  INTERVAL HISTORY: Nancy Hodges 76 y.o. female returns to the clinic today for follow-up visit.  The patient is feeling fine today with no concerning complaints except for some forgetfulness recently.  She denied having any current chest pain, shortness of breath, cough or hemoptysis.  She has no nausea, vomiting, diarrhea or constipation.  She has no headache or visual changes.  She has some areas of his skin rash on the upper extremities and chest likely secondary to her treatment with nivolumab.  The patient has no recent weight loss or night sweats.  She had repeat CT scan of the chest, abdomen pelvis performed recently and she is here for evaluation and discussion of her scan results.  MEDICAL HISTORY: Past Medical History:  Diagnosis Date   Allergic rhinitis    Anxiety    Arthritis    Asthma    Atrial flutter ([place])    Cholelithiases 09/26/2015   Diverticulosis    Encounter for  antineoplastic immunotherapy 12/09/2016   GERD (gastroesophageal reflux disease)    H/O hiatal hernia    History of blood transfusion    History of chemotherapy    History of kidney stones    History of radiation therapy    Internal hemorrhoid    Lung cancer ([place])    a. Stage IB non-small cell carcinoma, s/p R VATS, wedge resection, RU lobectomy 10/2012.   Persistent atrial fibrillation ([facility])    a. anticoagulated with Xarelto   Pneumonia 06/2016   [facility]   Presence of permanent cardiac pacemaker    Pulmonary nodule    Tachycardia-bradycardia syndrome ([place])    a. 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    ALLERGIES:  is allergic to chlorpheniramine-dm, prednisone, rofecoxib, septra [sulfamethoxazole-trimethoprim], molds & smuts, and penicillins.  MEDICATIONS:  Current Outpatient Medications  Medication Sig Dispense Refill   acetaminophen (TYLENOL) 500 MG tablet Take 500 mg by mouth 3 (three) times daily as needed for moderate pain or fever.      Acetaminophen-DM 1000-30 MG/30ML LIQD      albuterol (PROVENTIL HFA;VENTOLIN HFA) 108 (90 BASE) MCG/ACT inhaler Inhale 2 puffs into the lungs every 6 (six) hours as needed for wheezing or shortness of breath.      azelastine (ASTELIN) 0.1 % nasal spray Place 1 spray into both nostrils 2 (two) times daily. Use in each nostril as directed     calcium carbonate (TUMS - DOSED IN MG ELEMENTAL CALCIUM) 500 MG chewable tablet Chew 1 tablet by mouth  2 (two) times daily as needed for indigestion or heartburn.      diltiazem (CARTIA XT) 240 MG 24 hr capsule Take 1 capsule (240 mg total) by mouth daily. 90 capsule 3   dimenhyDRINATE (DRAMAMINE) 50 MG tablet Take 12.5 mg by mouth every 8 (eight) hours as needed for dizziness.     ipratropium-albuterol (DUONEB) 0.5-2.5 (3) MG/3ML SOLN Take 3 mLs by nebulization every 6 (six) hours as needed (shortness of breath or wheezing).      levothyroxine (SYNTHROID) 50 MCG tablet TAKE 1 TABLET(50 MCG) BY  MOUTH DAILY BEFORE AND BREAKFAST 90 tablet 0   lidocaine-prilocaine (EMLA) cream APPLY TOPICALLY TO TO THE AFFECTED AREA AS NEEDED 30 g 4   loratadine (CLARITIN) 10 MG tablet Take 10 mg by mouth daily.     meclizine (ANTIVERT) 12.5 MG tablet 1 tablet as needed     OVER THE COUNTER MEDICATION Take 5 mLs by mouth daily as needed (cough). [employer] kids cough syrup     Polyvinyl Alcohol-Povidone (REFRESH OP) Place 1 drop into both eyes daily as needed (dry eyes).     XARELTO 20 MG TABS tablet TAKE 1 TABLET(20 MG) BY MOUTH DAILY WITH SUPPER 30 tablet 5   No current facility-administered medications for this visit.    SURGICAL HISTORY:  Past Surgical History:  Procedure Laterality Date   CARDIOVERSION N/A 02/19/2018   Procedure: CARDIOVERSION;  Surgeon: Sanda Klein, MD;  Location: [facility] ENDOSCOPY;  Service: Cardiovascular;  Laterality: N/A;   CARDIOVERSION N/A 04/30/2018   Procedure: CARDIOVERSION;  Surgeon: Thayer Headings, MD;  Location: [facility] END[facility]COPY;  Service: Cardiovascular;  Laterality: N/A;   CATARACT EXTRACTION W/PHACO Right 08/14/2016   Procedure: CATARACT EXTRACTION PHACO AND INTRAOCULAR LENS PLACEMENT RIGHT EYE CDE 10.53;  Surgeon: Tonny Branch, MD;  Location: [facility];  Service: Ophthalmology;  Laterality: Right;  right   CATARACT EXTRACTION W/PHACO Left 09/25/2016   Procedure: CATARACT EXTRACTION PHACO AND INTRAOCULAR LENS PLACEMENT (IOC);  Surgeon: Tonny Branch, MD;  Location: [facility];  Service: Ophthalmology;  Laterality: Left;  left CDE 8.81   COLONOSCOPY     [facility]: cattered sigmoid diverticula, internal hemorrhoids, sessile polyp in ascending and mid transverse colon (tubular adenoma).    colonoscopy with polypectomy  2015   ESOPHAGOGASTRODUODENOSCOPY     [facility]: bile reflux in stomach, mild gastritis, hiatal hernia   ESOPHAGOGASTRODUODENOSCOPY N/A 01/31/2016   Procedure: ESOPHAGOGASTRODUODENOSCOPY (EGD);  Surgeon: [name], MD;  [facility];  Service: Endoscopy;   Laterality: N/A;  1200   IR FLUORO GUIDE PORT INSERTION RIGHT  06/30/2017   IR US GUIDE VASC ACCESS RIGHT  06/30/2017   LOBECTOMY Right 10/27/2012   Procedure: LOBECTOMY;  Surgeon: [name], MD;  Location: [facility];  Service: Thoracic;  Laterality: Right;   RIGHT UPPER LOBECTOMY & Node Dissection   LYMPH NODE BIOPSY Right 05/01/2014   Procedure: LYMPH NODE BIOPSY, Right Axillary;  Surgeon: [name], MD;  Location: [facility];  Service: Thoracic;  Laterality: Right;   PACEMAKER IMPLANT N/A 10/30/2017   SJM Assurity MRI PPM implanted by Dr [name] once leadless pacemaker battery depleted   PARTIAL HYSTERECTOMY     PERMANENT PACEMAKER INSERTION N/A 09/14/2012   Nanostim (SJM) leadless pacemaker (LEADLESS II STUDY PATIENT) implanted by Dr [name], no longer functioning, device abandoned.   VIDEO ASSISTED THORACOSCOPY (VATS)/WEDGE RESECTION Right 10/27/2012   Procedure: VIDEO ASSISTED THORACOSCOPY (VATS),RGHT UPPER LOBE LUNG WEDGE RESECTION;  Surgeon: [name], MD;  Location: [place];  Service: Thoracic;  Laterality: Right;    REVIEW OF SYSTEMS:  Constitutional: negative Eyes: negative Ears, nose, mouth, throat, and face: negative Respiratory: negative Cardiovascular: negative Gastrointestinal: negative Genitourinary:negative Integument/breast: positive for rash Hematologic/lymphatic: negative Musculoskeletal:negative Neurological: positive for memory problems Behavioral/Psych: negative Endocrine: negative Allergic/Immunologic: negative   PHYSICAL EXAMINATION: General appearance: alert, cooperative, and no distress Head: Normocephalic, without obvious abnormality, atraumatic Neck: no adenopathy, no JVD, supple, symmetrical, trachea midline, and thyroid not enlarged, symmetric, no tenderness/mass/nodules Lymph nodes: Cervical, supraclavicular, and axillary nodes normal. Resp: clear to auscultation bilaterally Back: symmetric, no curvature. ROM normal. No CVA  tenderness. Cardio: regular rate and rhythm, S1, S2 normal, no murmur, click, rub or gallop GI: soft, non-tender; bowel sounds normal; no masses,  no organomegaly Extremities: extremities normal, atraumatic, no cyanosis or edema Neurologic: Alert and oriented X 3, normal strength and tone. Normal symmetric reflexes. Normal coordination and gait  ECOG PERFORMANCE STATUS: 1 - Symptomatic but completely ambulatory  Blood pressure 132/69, pulse 61, temperature (!) 97.3 F (36.3 C), temperature source Oral, resp. rate 16, weight 143 lb 4.8 oz (65 kg), SpO2 98 %.  LABORATORY DATA: Lab Results  Component Value Date   WBC 5.0 09/19/2021   HGB 13.4 09/19/2021   HCT 38.4 09/19/2021   MCV 95.5 09/19/2021   PLT 165 09/19/2021      Chemistry      Component Value Date/Time   NA 137 09/19/2021 0850   NA 139 08/05/2017 0902   K 4.2 09/19/2021 0850   K 4.1 08/05/2017 0902   CL 104 09/19/2021 0850   CO2 28 09/19/2021 0850   CO2 28 08/05/2017 0902   BUN 9 09/19/2021 0850   BUN 10.1 08/05/2017 0902   CREATININE 0.60 09/19/2021 0850   CREATININE 0.8 08/05/2017 0902   GLU 134 (H) 02/18/2017 1410      Component Value Date/Time   CALCIUM 9.1 09/19/2021 0850   CALCIUM 9.5 08/05/2017 0902   ALKPHOS 122 09/19/2021 0850   ALKPHOS 111 08/05/2017 0902   AST 42 (H) 09/19/2021 0850   AST 33 08/05/2017 0902   ALT 36 09/19/2021 0850   ALT 34 08/05/2017 0902   BILITOT 1.3 (H) 09/19/2021 0850   BILITOT 1.39 (H) 08/05/2017 0902       RADIOGRAPHIC STUDIES: CT Chest W Contrast  Result Date: 09/19/2021 CLINICAL DATA:  Primary Cancer Type: Lung Imaging Indication: Assess response to therapy Interval therapy since last imaging? Yes Initial Cancer Diagnosis Date: 10/27/2012; Established by: Biopsy-proven Detailed Pathology: Initial diagnosis stage IB non-small cell lung cancer, adenocarcinoma. Primary Tumor location: Right upper lobe. Recurrence? Yes; Date(s) of recurrence: 05/01/2014 Established by:  Biopsy-proven Surgeries: Right upper lobectomy 10/27/2012. Pacemaker 2019. Partial hysterectomy. Chemotherapy: Yes; Ongoing? No; Most recent administration: 09/22/2014 Immunotherapy?  Yes; Type: Nivolumab; Ongoing? Yes Radiation therapy?  Yes; Date Range: 2017 EXAM: CT CHEST, ABDOMEN, AND PELVIS WITH CONTRAST TECHNIQUE: Multidetector CT imaging of the chest, abdomen and pelvis was performed following the standard protocol during bolus administration of intravenous contrast. RADIATION DOSE REDUCTION: This exam was performed according to the departmental dose-optimization program which includes automated exposure control, adjustment of the mA and/or kV according to patient size and/or use of iterative reconstruction technique. CONTRAST:  15mL OMNIPAQUE IOHEXOL 300 MG/ML  SOLN COMPARISON:  Most recent CT chest, abdomen and pelvis 05/31/2021. 04/24/2016 PET-CT. FINDINGS: CT CHEST FINDINGS Cardiovascular: Right chest port catheter. Aortic atherosclerosis. Left chest multi lead pacer. Normal heart size. Left coronary artery calcifications. No pericardial effusion. Mediastinum/Nodes: No persistently enlarged  mediastinal, hilar, or axillary lymph nodes. Thyroid gland, trachea, and esophagus demonstrate no significant findings. Lungs/Pleura: Status post right upper lobectomy with paramedian radiation fibrosis and peripheral scarring of the right lung. Multiple new bilateral spiculated pulmonary nodules, infrahilar nodule of the right lower lobe measuring 2.3 x 1.9 cm (series 6, image 91), a peripheral subpleural nodule of the right lower lobe measuring 1.6 x 1.2 cm (series 6, image 67), a nodule in the costophrenic recess of the right lower lobe measuring 1.3 x 1.2 cm (series 6, image 101), a nodule in the costophrenic recess of the left lower lobe measuring 1.6 x 1.2 cm (series 6, image 117). Unchanged small rounded nodule of the posterior left upper lobe measuring 0.3 cm (series 6, image 24). Mild centrilobular emphysema.  No pleural effusion or pneumothorax. Musculoskeletal: No chest wall mass or suspicious osseous lesions identified. CT ABDOMEN PELVIS FINDINGS Hepatobiliary: Hepatic steatosis. Coarse, nodular contour of the liver. No solid liver abnormality is seen. Gallstones in the dependent gallbladder. No gallbladder wall thickening, or biliary dilatation. Pancreas: Unremarkable. No pancreatic ductal dilatation or surrounding inflammatory changes. Spleen: Normal in size without significant abnormality. Adrenals/Urinary Tract: Adrenal glands are unremarkable. Kidneys are normal, without renal calculi, solid lesion, or hydronephrosis. Bladder is unremarkable. Stomach/Bowel: Stomach is within normal limits. Appendix appears normal. No evidence of bowel wall thickening, distention, or inflammatory changes. Descending and sigmoid diverticulosis. Vascular/Lymphatic: Aortic atherosclerosis. Numerous unchanged prominent portacaval, gastrohepatic ligament, and celiac axis lymph nodes (series 2, image 52). Reproductive: No mass or other abnormality. Other: No abdominal wall hernia or abnormality. No ascites. Musculoskeletal: No acute osseous findings. IMPRESSION: 1. Multiple new bilateral spiculated pulmonary nodules, consistent with metastatic disease. 2. Status post right upper lobectomy with paramedian radiation fibrosis and peripheral scarring of the right lung. 3. No persistently enlarged mediastinal, hilar, or axillary lymph nodes. 4. No evidence of metastatic disease to the abdomen or pelvis. 5. Numerous unchanged prominent portacaval, gastrohepatic ligament, and celiac axis lymph nodes, likely reactive to cirrhosis, metastatic disease not favored. 6. Hepatic steatosis and cirrhosis. 7. Cholelithiasis. 8. Mild emphysema. Aortic Atherosclerosis (ICD10-I70.0) and Emphysema (ICD10-J43.9). Electronically Signed   By: Delanna Ahmadi M.D.   On: 09/19/2021 10:49   CT Abdomen Pelvis W Contrast  Result Date: 09/19/2021 CLINICAL DATA:   Primary Cancer Type: Lung Imaging Indication: Assess response to therapy Interval therapy since last imaging? Yes Initial Cancer Diagnosis Date: 10/27/2012; Established by: Biopsy-proven Detailed Pathology: Initial diagnosis stage IB non-small cell lung cancer, adenocarcinoma. Primary Tumor location: Right upper lobe. Recurrence? Yes; Date(s) of recurrence: 05/01/2014 Established by: Biopsy-proven Surgeries: Right upper lobectomy 10/27/2012. Pacemaker 2019. Partial hysterectomy. Chemotherapy: Yes; Ongoing? No; Most recent administration: 09/22/2014 Immunotherapy?  Yes; Type: Nivolumab; Ongoing? Yes Radiation therapy?  Yes; Date Range: 2017 EXAM: CT CHEST, ABDOMEN, AND PELVIS WITH CONTRAST TECHNIQUE: Multidetector CT imaging of the chest, abdomen and pelvis was performed following the standard protocol during bolus administration of intravenous contrast. RADIATION DOSE REDUCTION: This exam was performed according to the departmental dose-optimization program which includes automated exposure control, adjustment of the mA and/or kV according to patient size and/or use of iterative reconstruction technique. CONTRAST:  174mL OMNIPAQUE IOHEXOL 300 MG/ML  SOLN COMPARISON:  Most recent CT chest, abdomen and pelvis 05/31/2021. 04/24/2016 PET-CT. FINDINGS: CT CHEST FINDINGS Cardiovascular: Right chest port catheter. Aortic atherosclerosis. Left chest multi lead pacer. Normal heart size. Left coronary artery calcifications. No pericardial effusion. Mediastinum/Nodes: No persistently enlarged mediastinal, hilar, or axillary lymph nodes. Thyroid gland, trachea, and esophagus demonstrate no  significant findings. Lungs/Pleura: Status post right upper lobectomy with paramedian radiation fibrosis and peripheral scarring of the right lung. Multiple new bilateral spiculated pulmonary nodules, infrahilar nodule of the right lower lobe measuring 2.3 x 1.9 cm (series 6, image 91), a peripheral subpleural nodule of the right lower lobe  measuring 1.6 x 1.2 cm (series 6, image 67), a nodule in the costophrenic recess of the right lower lobe measuring 1.3 x 1.2 cm (series 6, image 101), a nodule in the costophrenic recess of the left lower lobe measuring 1.6 x 1.2 cm (series 6, image 117). Unchanged small rounded nodule of the posterior left upper lobe measuring 0.3 cm (series 6, image 24). Mild centrilobular emphysema. No pleural effusion or pneumothorax. Musculoskeletal: No chest wall mass or suspicious osseous lesions identified. CT ABDOMEN PELVIS FINDINGS Hepatobiliary: Hepatic steatosis. Coarse, nodular contour of the liver. No solid liver abnormality is seen. Gallstones in the dependent gallbladder. No gallbladder wall thickening, or biliary dilatation. Pancreas: Unremarkable. No pancreatic ductal dilatation or surrounding inflammatory changes. Spleen: Normal in size without significant abnormality. Adrenals/Urinary Tract: Adrenal glands are unremarkable. Kidneys are normal, without renal calculi, solid lesion, or hydronephrosis. Bladder is unremarkable. Stomach/Bowel: Stomach is within normal limits. Appendix appears normal. No evidence of bowel wall thickening, distention, or inflammatory changes. Descending and sigmoid diverticulosis. Vascular/Lymphatic: Aortic atherosclerosis. Numerous unchanged prominent portacaval, gastrohepatic ligament, and celiac axis lymph nodes (series 2, image 52). Reproductive: No mass or other abnormality. Other: No abdominal wall hernia or abnormality. No ascites. Musculoskeletal: No acute osseous findings. IMPRESSION: 1. Multiple new bilateral spiculated pulmonary nodules, consistent with metastatic disease. 2. Status post right upper lobectomy with paramedian radiation fibrosis and peripheral scarring of the right lung. 3. No persistently enlarged mediastinal, hilar, or axillary lymph nodes. 4. No evidence of metastatic disease to the abdomen or pelvis. 5. Numerous unchanged prominent portacaval, gastrohepatic  ligament, and celiac axis lymph nodes, likely reactive to cirrhosis, metastatic disease not favored. 6. Hepatic steatosis and cirrhosis. 7. Cholelithiasis. 8. Mild emphysema. Aortic Atherosclerosis (ICD10-I70.0) and Emphysema (ICD10-J43.9). Electronically Signed   By: Delanna Ahmadi M.D.   On: 09/19/2021 10:49    ASSESSMENT AND PLAN: This is a very pleasant 76 years old white female with metastatic non-small cell lung cancer, adenocarcinoma with no actionable mutations status post systemic chemotherapy with carboplatin and Alimta and she is currently undergoing treatment with second line immunotherapy with Nivolumab status post 6 cycles. This was followed by immunotherapy with Nivolumab 480 mg IV every 4 weeks status post 58 cycles. The patient has been tolerating her treatment well except for mild skin rash. She had repeat CT scan of the chest, abdomen and pelvis performed recently.  I personally and independently reviewed the scan images and discussed the result and showed the images to the patient today. Her scan showed multiple new bilateral spiculated pulmonary nodules suspicious for metastatic disease.  The patient also mentions that she had recent chest congestion and cough.  Inflammatory process could not be completely excluded at this point. I recommended for the patient to have repeat PET scan as well as CT of the head with and without contrast to rule out any disease progression.  She cannot have MRI of the brain because of her pacemaker. For the skin rash, I initially consider The patient for treatment with Medrol Dosepak but she has high allergy to prednisone.  I will send her prescription for hydrocortisone cream to be applied topically to the affected area. For the cardiac arrhythmia, she is followed  by Dr. [name]. The patient will come back for follow-up visit in 2 weeks for evaluation and discussion of her scan results and further recommendation regarding treatment of her condition.  I will  hold her treatment with immunotherapy for now until we have the imaging study results. She was advised to call immediately if she has any other concerning symptoms in the interval.  The patient voices understanding of current disease status and treatment options and is in agreement with the current care plan.  All questions were answered. The patient knows to call the clinic with any problems, questions or concerns. We can certainly see the patient much sooner if necessary.  Disclaimer: This note was dictated with voice recognition software. Similar sounding words can inadvertently be transcribed and may not be corrected upon review.

## 2021-09-24 NOTE — Progress Notes (Signed)
Oncology Nurse Navigator Documentation  Oncology Nurse Navigator Flowsheets 09/24/2021 07/30/2021 11/22/2019 08/02/2019 10/27/2018 07/30/2018 07/27/2018  Abnormal Finding Date - - - 09/15/2012 - - -  Confirmed Diagnosis Date - - - 10/27/2012 - - -  Diagnosis Status - - - Confirmed Diagnosis Complete - - -  Planned Course of Treatment - - - Chemotherapy;Surgery;Targeted Therapy - - -  Phase of Treatment - - - Targeted Therapy - - -  Chemotherapy Actual Start Date: - - - 05/31/2014 - - -  Chemotherapy Actual End Date: - - - 09/22/2014 - - -  Targeted Therapy Actual Start Date: - - - 12/23/2016 - - -  Surgery Actual Start Date: - - - 10/27/2012 - - -  Navigator Follow Up Date: - - - 08/02/2019 - - -  Navigator Follow Up Reason: - - - Follow-up Appointment - - -  Navigation Complete Date: - - - 08/02/2019 - - -  Post Navigation: Continue to Follow Patient? - - - Yes - - -  Navigator Location [place]  Navigator Encounter Type Clinic/MDC Clinic/MDC Clinic/MDC Clinic/[facility]C Telephone Telephone Telephone  Telephone - - - - Outgoing Call Outgoing Call Outgoing Call  Treatment Initiated Date - - - 05/31/2014 - - -  Patient Visit Type MedOnc MedOnc MedOnc MedOnc - - -  Treatment Phase Treatment Treatment Treatment Treatment Treatment Treatment Treatment  Barriers/Navigation Needs Education/I spoke with Nancy Hodges today.  Per Dr. Julien Nordmann patient tx is held today due to possible disease progression. I helped to explain plan of care.  She verbalized understanding.  - Education No Barriers At This Time Education Education No Needs  Education Other - Other - Other Other -  Interventions Education;Psycho-Social Support Psycho-Social Support Education;Psycho-Social Support None Required Education Education None required  Acuity Level 2-Minimal Needs (1-2 Barriers Identified) Level 3-Moderate Needs (3-4 Barriers  Identified) Level 2-Minimal Needs (1-2 Barriers Identified) Level 1-No Barriers Level 1 Level 1 Level 1  Coordination of Care - - - - - - -  Education Method Verbal - - - Verbal Verbal -  Time Spent with Patient 30 30 15 30 30 15  15

## 2021-09-25 ENCOUNTER — Encounter: Payer: Self-pay | Admitting: *Deleted

## 2021-09-25 NOTE — Progress Notes (Signed)
Oncology Nurse Navigator Documentation  Oncology Nurse Navigator Flowsheets 09/25/2021 09/24/2021 07/30/2021 11/22/2019 08/02/2019 10/27/2018 07/30/2018  Abnormal Finding Date - - - - 09/15/2012 - -  Confirmed Diagnosis Date - - - - 10/27/2012 - -  Diagnosis Status - - - - Confirmed Diagnosis Complete - -  Planned Course of Treatment - - - - Chemotherapy;Surgery;Targeted Therapy - -  Phase of Treatment - - - - Targeted Therapy - -  Chemotherapy Actual Start Date: - - - - 05/31/2014 - -  Chemotherapy Actual End Date: - - - - 09/22/2014 - -  Targeted Therapy Actual Start Date: - - - - 12/23/2016 - -  Surgery Actual Start Date: - - - - 10/27/2012 - -  Navigator Follow Up Date: - - - - 08/02/2019 - -  Navigator Follow Up Reason: - - - - Follow-up Appointment - -  Navigation Complete Date: - - - - 08/02/2019 - -  Post Navigation: Continue to Follow Patient? - - - - Yes - -  Navigator Location [facility]  Navigator Encounter Type Telephone Clinic/MDC Clinic/MDC Clinic/MDC Clinic/MDC Telephone Telephone  Telephone Outgoing Call - - - - Outgoing Call Outgoing Call  Treatment Initiated Date - - - - 05/31/2014 - -  Patient Visit Type Other MedOnc MedOnc MedOnc MedOnc - -  Treatment Phase Other Treatment Treatment Treatment Treatment Treatment Treatment  Barriers/Navigation Needs Coordination of Care;Education/I followed up on Ms. Franklyn's schedule. It looks like her follow up appt is before her appt to see Dr. Julien Nordmann. I called her to change her follow up appt to after PET scan. I was unable to reach but did leave vm message with my name and phone number to call.  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

## 2021-09-30 ENCOUNTER — Encounter: Payer: Self-pay | Admitting: *Deleted

## 2021-09-30 ENCOUNTER — Other Ambulatory Visit: Payer: Medicare Other

## 2021-09-30 NOTE — Progress Notes (Signed)
Oncology Nurse Navigator Documentation  Oncology Nurse Navigator Flowsheets 09/30/2021 09/25/2021 09/24/2021 07/30/2021 11/22/2019 08/02/2019 10/27/2018  Abnormal Finding Date - - - - - 09/15/2012 -  Confirmed Diagnosis Date - - - - - 10/27/2012 -  Diagnosis Status - - - - - Confirmed Diagnosis Complete -  Planned Course of Treatment - - - - - Chemotherapy;Surgery;Targeted Therapy -  Phase of Treatment - - - - - Targeted Therapy -  Chemotherapy Actual Start Date: - - - - - 05/31/2014 -  Chemotherapy Actual End Date: - - - - - 09/22/2014 -  Targeted Therapy Actual Start Date: - - - - - 12/23/2016 -  Surgery Actual Start Date: - - - - - 10/27/2012 -  Navigator Follow Up Date: - - - - - 08/02/2019 -  Navigator Follow Up Reason: - - - - - Follow-up Appointment -  Navigation Complete Date: - - - - - 08/02/2019 -  Post Navigation: Continue to Follow Patient? - - - - - Yes -  Navigator Location [facility]  Navigator Encounter Type Telephone Telephone Clinic/MDC Clinic/MDC Clinic/MDC Clinic/MDC Telephone  Telephone [employer];Incoming Call [facility] Call  Treatment Initiated Date - - - - - 05/31/2014 -  Patient Visit Type Other Other MedOnc MedOnc MedOnc MedOnc -  Treatment Phase Treatment Other Treatment Treatment Treatment Treatment Treatment  Barriers/Navigation Needs Coordination of Care;Education/Ms. Nowakowski called and I called her back. I helped to re-arrange her appts. She had multiple appts at the same time. She was thankful for the help.  Coordination of Care;Education Education - Education No Barriers At This Time Education  Education Other Other Other - Other - Other  Interventions Coordination of Care;Education;Psycho-Social Support Education;Coordination of Care Education;Psycho-Social Support Psycho-Social Support Education;Psycho-Social Support None Required  Education  Acuity Level 2-Minimal Needs (1-2 Barriers Identified) Level 2-Minimal Needs (1-2 Barriers Identified) Level 2-Minimal Needs (1-2 Barriers Identified) Level 3-Moderate Needs (3-4 Barriers Identified) Level 2-Minimal Needs (1-2 Barriers Identified) Level 1-No Barriers Level 1  Coordination of Care Appts Other - - - - -  Education Method Verbal Verbal Verbal - - - Verbal  Time Spent with Patient 30 30 30 30 15 30  30

## 2021-10-08 ENCOUNTER — Other Ambulatory Visit: Payer: Self-pay

## 2021-10-08 ENCOUNTER — Ambulatory Visit (HOSPITAL_COMMUNITY)
Admission: RE | Admit: 2021-10-08 | Discharge: 2021-10-08 | Disposition: A | Discharge status: Home / Self Care | Payer: Medicare Other | Source: Ambulatory Visit | Attending: Internal Medicine | Admitting: Internal Medicine

## 2021-10-08 ENCOUNTER — Ambulatory Visit: Payer: Medicare Other | Admitting: Internal Medicine

## 2021-10-08 ENCOUNTER — Encounter (HOSPITAL_COMMUNITY): Payer: Self-pay

## 2021-10-08 ENCOUNTER — Encounter (HOSPITAL_COMMUNITY)
Admission: RE | Admit: 2021-10-08 | Discharge: 2021-10-08 | Disposition: A | Discharge status: Home / Self Care | Payer: Medicare Other | Source: Ambulatory Visit | Attending: Internal Medicine | Admitting: Internal Medicine

## 2021-10-08 DIAGNOSIS — K8020 Calculus of gallbladder without cholecystitis without obstruction: Secondary | ICD-10-CM | POA: Insufficient documentation

## 2021-10-08 DIAGNOSIS — K7460 Unspecified cirrhosis of liver: Secondary | ICD-10-CM | POA: Diagnosis not present

## 2021-10-08 DIAGNOSIS — J439 Emphysema, unspecified: Secondary | ICD-10-CM | POA: Insufficient documentation

## 2021-10-08 DIAGNOSIS — I6782 Cerebral ischemia: Secondary | ICD-10-CM | POA: Insufficient documentation

## 2021-10-08 DIAGNOSIS — J9859 Other diseases of mediastinum, not elsewhere classified: Secondary | ICD-10-CM | POA: Insufficient documentation

## 2021-10-08 DIAGNOSIS — C3491 Malignant neoplasm of unspecified part of right bronchus or lung: Secondary | ICD-10-CM | POA: Diagnosis present

## 2021-10-08 DIAGNOSIS — R918 Other nonspecific abnormal finding of lung field: Secondary | ICD-10-CM | POA: Diagnosis not present

## 2021-10-08 DIAGNOSIS — Z902 Acquired absence of lung [part of]: Secondary | ICD-10-CM | POA: Diagnosis not present

## 2021-10-08 DIAGNOSIS — I700 Atherosclerosis of aorta: Secondary | ICD-10-CM | POA: Diagnosis not present

## 2021-10-08 DIAGNOSIS — Z8673 Personal history of transient ischemic attack (TIA), and cerebral infarction without residual deficits: Secondary | ICD-10-CM | POA: Insufficient documentation

## 2021-10-08 DIAGNOSIS — C3490 Malignant neoplasm of unspecified part of unspecified bronchus or lung: Secondary | ICD-10-CM

## 2021-10-08 DIAGNOSIS — I2510 Atherosclerotic heart disease of native coronary artery without angina pectoris: Secondary | ICD-10-CM | POA: Insufficient documentation

## 2021-10-08 LAB — GLUCOSE, CAPILLARY: Glucose-Capillary: 144 mg/dL — ABNORMAL HIGH (ref 70–99)

## 2021-10-08 MED ORDER — IOHEXOL 300 MG/ML  SOLN
75.0000 mL | Freq: Once | INTRAMUSCULAR | Status: AC | PRN
  Administered 2021-10-08: 75 mL via INTRAVENOUS

## 2021-10-08 MED ORDER — FLUDEOXYGLUCOSE F - 18 (FDG) INJECTION
7.1500 | Freq: Once | INTRAVENOUS | Status: AC
  Administered 2021-10-08: 7.15 via INTRAVENOUS

## 2021-10-08 MED ORDER — SODIUM CHLORIDE (PF) 0.9 % IJ SOLN
INTRAMUSCULAR | Status: AC
  Filled 2021-10-08: qty 50

## 2021-10-10 ENCOUNTER — Other Ambulatory Visit: Payer: Self-pay

## 2021-10-10 DIAGNOSIS — C3491 Malignant neoplasm of unspecified part of right bronchus or lung: Secondary | ICD-10-CM

## 2021-10-14 ENCOUNTER — Inpatient Hospital Stay: Payer: Medicare Other

## 2021-10-14 ENCOUNTER — Inpatient Hospital Stay: Payer: Medicare Other | Attending: Internal Medicine | Admitting: Internal Medicine

## 2021-10-14 ENCOUNTER — Other Ambulatory Visit: Payer: Self-pay

## 2021-10-14 ENCOUNTER — Telehealth: Payer: Self-pay | Admitting: Radiation Oncology

## 2021-10-14 VITALS — BP 152/66 | HR 71 | Temp 97.2°F | Resp 16 | Wt 146.2 lb

## 2021-10-14 DIAGNOSIS — Z5112 Encounter for antineoplastic immunotherapy: Secondary | ICD-10-CM | POA: Diagnosis present

## 2021-10-14 DIAGNOSIS — C773 Secondary and unspecified malignant neoplasm of axilla and upper limb lymph nodes: Secondary | ICD-10-CM | POA: Diagnosis present

## 2021-10-14 DIAGNOSIS — C3491 Malignant neoplasm of unspecified part of right bronchus or lung: Secondary | ICD-10-CM

## 2021-10-14 DIAGNOSIS — Z95828 Presence of other vascular implants and grafts: Secondary | ICD-10-CM

## 2021-10-14 DIAGNOSIS — C3411 Malignant neoplasm of upper lobe, right bronchus or lung: Secondary | ICD-10-CM | POA: Insufficient documentation

## 2021-10-14 DIAGNOSIS — Z79899 Other long term (current) drug therapy: Secondary | ICD-10-CM | POA: Insufficient documentation

## 2021-10-14 LAB — CMP (CANCER CENTER ONLY)
ALT: 32 U/L (ref 0–44)
AST: 39 U/L (ref 15–41)
Albumin: 4.1 g/dL (ref 3.5–5.0)
Alkaline Phosphatase: 108 U/L (ref 38–126)
Anion gap: 6 (ref 5–15)
BUN: 10 mg/dL (ref 8–23)
CO2: 30 mmol/L (ref 22–32)
Calcium: 9.6 mg/dL (ref 8.9–10.3)
Chloride: 104 mmol/L (ref 98–111)
Creatinine: 0.61 mg/dL (ref 0.44–1.00)
GFR, Estimated: >60 mL/min (ref >60)
Glucose, Bld: 154 mg/dL — ABNORMAL HIGH (ref 70–99)
Potassium: 4.4 mmol/L (ref 3.5–5.1)
Sodium: 140 mmol/L (ref 135–145)
Total Bilirubin: 1.2 mg/dL (ref 0.3–1.2)
Total Protein: 7.3 g/dL (ref 6.5–8.1)

## 2021-10-14 LAB — CBC WITH DIFFERENTIAL (CANCER CENTER ONLY)
Abs Immature Granulocytes: 0.01 10*3/uL (ref 0.00–0.07)
Basophils Absolute: 0.1 10*3/uL (ref 0.0–0.1)
Basophils Relative: 1 %
Eosinophils Absolute: 0.3 10*3/uL (ref 0.0–0.5)
Eosinophils Relative: 4 %
HCT: 42.7 % (ref 36.0–46.0)
Hemoglobin: 14.8 g/dL (ref 12.0–15.0)
Immature Granulocytes: 0 %
Lymphocytes Relative: 19 %
Lymphs Abs: 1.2 10*3/uL (ref 0.7–4.0)
MCH: 33.3 pg (ref 26.0–34.0)
MCHC: 34.7 g/dL (ref 30.0–36.0)
MCV: 96.2 fL (ref 80.0–100.0)
Monocytes Absolute: 0.4 10*3/uL (ref 0.1–1.0)
Monocytes Relative: 6 %
Neutro Abs: 4.6 10*3/uL (ref 1.7–7.7)
Neutrophils Relative %: 70 %
Platelet Count: 200 10*3/uL (ref 150–400)
RBC: 4.44 MIL/uL (ref 3.87–5.11)
RDW: 12.8 % (ref 11.5–15.5)
WBC Count: 6.6 10*3/uL (ref 4.0–10.5)
nRBC: 0 % (ref 0.0–0.2)

## 2021-10-14 MED ORDER — HEPARIN SOD (PORK) LOCK FLUSH 100 UNIT/ML IV SOLN
500.0000 [IU] | Freq: Once | INTRAVENOUS | Status: AC | PRN
  Administered 2021-10-14: 500 [IU] via INTRAVENOUS

## 2021-10-14 MED ORDER — SODIUM CHLORIDE 0.9% FLUSH
10.0000 mL | INTRAVENOUS | Status: DC | PRN
  Administered 2021-10-14: 10 mL via INTRAVENOUS

## 2021-10-14 NOTE — Telephone Encounter (Signed)
Called patient to schedule consultation with Dr. Sondra Come. No answer, LVM for return call. ?

## 2021-10-14 NOTE — Progress Notes (Signed)
[place] [phone]   [phone]  OFFICE PROGRESS NOTE  Harris, [name], [name] [address] [place] [address]  DIAGNOSIS: Metastatic non-small cell lung cancer, adenocarcinoma. This was initially diagnosed as stage IB (T2a, N0, M0) in March of 2014, status post right upper lobectomy with lymph node dissection but the patient has evidence for disease recurrence in the right axilla status post resection.  PRIOR THERAPY:  1) Status post right axillary lymph node biopsy on 05/01/2014 under the care of Dr. Roxan Hockey. 2) Systemic chemotherapy with carboplatin for AUC of 5 and Alimta 500 mg/M2 every 3 weeks. First cycle 05/31/2014. Status post 6 cycles. Last dose was given 09/22/2014. 3) palliative radiotherapy to the subcarinal lymph node under the care of Dr. Sondra Come. 4) Nivolumab 240 mg IV every 2 weeks. First dose 12/23/2016. Status post 6 cycles.  CURRENT THERAPY:  Nivolumab 480 mg IV every 4 weeks. First dose 03/17/2017. Status post 58 cycles.  INTERVAL HISTORY: Nancy Hodges 76 y.o. female returns to the clinic today for follow-up visit.  The patient is feeling fine today with no concerning complaints except for intermittent low back pain.  She denied having any current chest pain, shortness of breath, cough or hemoptysis.  She denied having any fever or chills.  She has no nausea, vomiting, diarrhea or constipation.  She has no headache or visual changes.  She was found on previous CT scan of the chest to have some suspicious enlarging pulmonary nodules.  I ordered a PET scan and she is here today for evaluation and discussion of her scan results and treatment options.   MEDICAL HISTORY: Past Medical History:  Diagnosis Date   Allergic rhinitis    Anxiety    Arthritis    Asthma    Atrial flutter ([place])    Cholelithiases 09/26/2015   Diverticulosis    Encounter for antineoplastic immunotherapy 12/09/2016   GERD  (gastroesophageal reflux disease)    H/O hiatal hernia    History of blood transfusion    History of chemotherapy    History of kidney stones    History of radiation therapy    Internal hemorrhoid    Lung cancer ([place])    a. Stage IB non-small cell carcinoma, s/p R VATS, wedge resection, RU lobectomy 10/2012.   Persistent atrial fibrillation ([facility])    a. anticoagulated with Xarelto   Pneumonia 06/2016   [facility]   Presence of permanent cardiac pacemaker    Pulmonary nodule    Tachycardia-bradycardia syndrome ([place])    a. 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    ALLERGIES:  is allergic to chlorpheniramine-dm, prednisone, rofecoxib, septra [sulfamethoxazole-trimethoprim], fluticasone, molds & smuts, and penicillins.  MEDICATIONS:  Current Outpatient Medications  Medication Sig Dispense Refill   acetaminophen (TYLENOL) 500 MG tablet Take 500 mg by mouth 3 (three) times daily as needed for moderate pain or fever.      Acetaminophen-DM 1000-30 MG/30ML LIQD      albuterol (PROVENTIL HFA;VENTOLIN HFA) 108 (90 BASE) MCG/ACT inhaler Inhale 2 puffs into the lungs every 6 (six) hours as needed for wheezing or shortness of breath.      azelastine (ASTELIN) 0.1 % nasal spray Place 1 spray into both nostrils 2 (two) times daily. Use in each nostril as directed     calcium carbonate (TUMS - DOSED IN MG ELEMENTAL CALCIUM) 500 MG chewable tablet Chew 1 tablet by mouth 2 (two) times daily as needed for  indigestion or heartburn.      diltiazem (CARTIA XT) 240 MG 24 hr capsule Take 1 capsule (240 mg total) by mouth daily. 90 capsule 3   dimenhyDRINATE (DRAMAMINE) 50 MG tablet Take 12.5 mg by mouth every 8 (eight) hours as needed for dizziness.     hydrocortisone 1 % lotion Apply 1 application topically 2 (two) times daily. 118 mL 0   ipratropium-albuterol (DUONEB) 0.5-2.5 (3) MG/3ML SOLN Take 3 mLs by nebulization every 6 (six) hours as needed (shortness of breath or wheezing).      levothyroxine  (SYNTHROID) 50 MCG tablet TAKE 1 TABLET(50 MCG) BY MOUTH DAILY BEFORE AND BREAKFAST 90 tablet 0   lidocaine-prilocaine (EMLA) cream APPLY TOPICALLY TO TO THE AFFECTED AREA AS NEEDED 30 g 4   loratadine (CLARITIN) 10 MG tablet Take 10 mg by mouth daily.     meclizine (ANTIVERT) 12.5 MG tablet 1 tablet as needed     OVER THE COUNTER MEDICATION Take 5 mLs by mouth daily as needed (cough). [employer] kids cough syrup     Polyvinyl Alcohol-Povidone (REFRESH OP) Place 1 drop into both eyes daily as needed (dry eyes).     XARELTO 20 MG TABS tablet TAKE 1 TABLET(20 MG) BY MOUTH DAILY WITH SUPPER 30 tablet 5   No current facility-administered medications for this visit.   Facility-Administered Medications Ordered in Other Visits  Medication Dose Route Frequency Provider Last Rate Last Admin   sodium chloride flush (NS) 0.9 % injection 10 mL  10 mL Intravenous PRN Curt Bears, MD   10 mL at 10/14/21 1015    SURGICAL HISTORY:  Past Surgical History:  Procedure Laterality Date   CARDIOVERSION N/A 02/19/2018   Procedure: CARDIOVERSION;  Surgeon: Sanda Klein, MD;  Location: [facility] ENDOSCOPY;  Service: Cardiovascular;  Laterality: N/A;   CARDIOVERSION N/A 04/30/2018   Procedure: CARDIOVERSION;  Surgeon: Thayer Headings, MD;  Location: [facility] ENDOSCOPY;  Service: Cardiovascular;  Laterality: N/A;   CATARACT EXTRACTION W/PHACO Right 08/14/2016   Procedure: CATARACT EXTRACTION PHACO AND INTRAOCULAR LENS PLACEMENT RIGHT EYE CDE 10.53;  Surgeon: Tonny Branch, MD;  Location: [facility];  Service: Ophthalmology;  Laterality: Right;  right   CATARACT EXTRACTION W/PHACO Left 09/25/2016   Procedure: CATARACT EXTRACTION PHACO AND INTRAOCULAR LENS PLACEMENT (IOC);  Surgeon: Tonny Branch, MD;  Location: [facility];  Service: Ophthalmology;  Laterality: Left;  left CDE 8.81   COLONOSCOPY     [facility]: cattered sigmoid diverticula, internal hemorrhoids, sessile polyp in ascending and mid transverse colon (tubular adenoma).     colonoscopy with polypectomy  2015   ESOPHAGOGASTRODUODENOSCOPY     [facility]: bile reflux in stomach, mild gastritis, hiatal hernia   ESOPHAGOGASTRODUODENOSCOPY N/A 01/31/2016   Procedure: ESOPHAGOGASTRODUODENOSCOPY (EGD);  Surgeon: [name], MD;  Location: [facility];  Service: Endoscopy;  Laterality: N/A;  1200   IR FLUORO GUIDE PORT INSERTION RIGHT  06/30/2017   IR US GUIDE VASC ACCESS RIGHT  06/30/2017   LOBECTOMY Right 10/27/2012   Procedure: LOBECTOMY;  Surgeon: [name], MD;  Location: [facility];  Service: Thoracic;  Laterality: Right;   RIGHT UPPER LOBECTOMY & Node Dissection   LYMPH NODE BIOPSY Right 05/01/2014   Procedure: LYMPH NODE BIOPSY, Right Axillary;  Surgeon: [name], MD;  Location: [facility];  Service: Thoracic;  Laterality: Right;   PACEMAKER IMPLANT N/A 10/30/2017   SJM Assurity MRI PPM implanted by Dr [name] once leadless pacemaker battery depleted   PARTIAL HYSTERECTOMY     PERMANENT  PACEMAKER INSERTION N/A 09/14/2012   Nanostim (SJM) leadless pacemaker (LEADLESS II STUDY PATIENT) implanted by Dr [name], no longer functioning, device abandoned.   VIDEO ASSISTED THORACOSCOPY (VATS)/WEDGE RESECTION Right 10/27/2012   Procedure: VIDEO ASSISTED THORACOSCOPY (VATS),RGHT UPPER LOBE LUNG WEDGE RESECTION;  Surgeon: [name], MD;  Location: [facility];  Service: Thoracic;  Laterality: Right;    REVIEW OF SYSTEMS:  Constitutional: negative Eyes: negative Ears, nose, mouth, throat, and face: negative Respiratory: negative Cardiovascular: negative Gastrointestinal: negative Genitourinary:negative Integument/breast: positive for rash Hematologic/lymphatic: negative Musculoskeletal:positive for back pain Neurological: positive for memory problems Behavioral/Psych: negative Endocrine: negative Allergic/Immunologic: negative   PHYSICAL EXAMINATION: General appearance: alert, cooperative, and no distress Head: Normocephalic, without obvious  abnormality, atraumatic Neck: no adenopathy, no JVD, supple, symmetrical, trachea midline, and thyroid not enlarged, symmetric, no tenderness/mass/nodules Lymph nodes: Cervical, supraclavicular, and axillary nodes normal. Resp: clear to auscultation bilaterally Back: symmetric, no curvature. ROM normal. No CVA tenderness. Cardio: regular rate and rhythm, S1, S2 normal, no murmur, click, rub or gallop GI: soft, non-tender; bowel sounds normal; no masses,  no organomegaly Extremities: extremities normal, atraumatic, no cyanosis or edema Neurologic: Alert and oriented X 3, normal strength and tone. Normal symmetric reflexes. Normal coordination and gait  ECOG PERFORMANCE STATUS: 1 - Symptomatic but completely ambulatory  Blood pressure (!) 152/66, pulse 71, temperature (!) 97.2 F (36.2 C), temperature source Tympanic, resp. rate 16, weight 146 lb 3.2 oz (66.3 kg), SpO2 98 %.  LABORATORY DATA: Lab Results  Component Value Date   WBC 5.0 09/19/2021   HGB 13.4 09/19/2021   HCT 38.4 09/19/2021   MCV 95.5 09/19/2021   PLT 165 09/19/2021      Chemistry      Component Value Date/Time   NA 137 09/19/2021 0850   NA 139 08/05/2017 0902   K 4.2 09/19/2021 0850   K 4.1 08/05/2017 0902   CL 104 09/19/2021 0850   CO2 28 09/19/2021 0850   CO2 28 08/05/2017 0902   BUN 9 09/19/2021 0850   BUN 10.1 08/05/2017 0902   CREATININE 0.60 09/19/2021 0850   CREATININE 0.8 08/05/2017 0902   GLU 134 (H) 02/18/2017 1410      Component Value Date/Time   CALCIUM 9.1 09/19/2021 0850   CALCIUM 9.5 08/05/2017 0902   ALKPHOS 122 09/19/2021 0850   ALKPHOS 111 08/05/2017 0902   AST 42 (H) 09/19/2021 0850   AST 33 08/05/2017 0902   ALT 36 09/19/2021 0850   ALT 34 08/05/2017 0902   BILITOT 1.3 (H) 09/19/2021 0850   BILITOT 1.39 (H) 08/05/2017 0902       RADIOGRAPHIC STUDIES: CT HEAD W & WO CONTRAST (5MM)  Result Date: 10/08/2021 CLINICAL DATA:  Non-small cell lung cancer (NSCLC), staging EXAM: CT  HEAD WITHOUT AND WITH CONTRAST TECHNIQUE: Contiguous axial images were obtained from the base of the skull through the vertex without and with intravenous contrast. RADIATION DOSE REDUCTION: This exam was performed according to the departmental dose-optimization program which includes automated exposure control, adjustment of the mA and/or kV according to patient size and/or use of iterative reconstruction technique. CONTRAST:  69mL OMNIPAQUE IOHEXOL 300 MG/ML  SOLN COMPARISON:  02/03/2020. FINDINGS: Brain: No evidence of acute infarction, hemorrhage, hydrocephalus, extra-axial collection or mass lesion/mass effect. No evidence of abnormal enhancement. Similar patchy white matter hypoattenuation, most conspicuous in the left anterior frontal region and nonspecific but consistent with chronic microvascular ischemic change. Similar remote right cerebellar infarct. Vascular: No hyperdense vessel or unexpected calcification. Visible  vessels are patent. Skull: No evidence of acute fracture or aggressive lesion. Sinuses/Orbits: Clear sinuses.  Unremarkable orbits. Other: No most effusions. IMPRESSION: 1. No evidence of acute intracranial abnormality or metastatic disease by CT. If the patient is able, MRI with contrast could provide more sensitive evaluation. 2. 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 Electronically Signed   By: Margaretha Sheffield M.D.   On: 10/08/2021 11:21   CT Chest W Contrast  Result Date: 09/19/2021 CLINICAL DATA:  Primary Cancer Type: Lung Imaging Indication: Assess response to therapy Interval therapy since last imaging? Yes Initial Cancer Diagnosis Date: 10/27/2012; Established by: Biopsy-proven Detailed Pathology: Initial diagnosis stage IB non-small cell lung cancer, adenocarcinoma. Primary Tumor location: Right upper lobe. Recurrence? Yes; Date(s) of recurrence: 05/01/2014 Established by: Biopsy-proven Surgeries: Right upper lobectomy 10/27/2012.  Pacemaker 2019. Partial hysterectomy. Chemotherapy: Yes; Ongoing? No; Most recent administration: 09/22/2014 Immunotherapy?  Yes; Type: Nivolumab; Ongoing? Yes Radiation therapy?  Yes; Date Range: 2017 EXAM: CT CHEST, ABDOMEN, AND PELVIS WITH CONTRAST TECHNIQUE: Multidetector CT imaging of the chest, abdomen and pelvis was performed following the standard protocol during bolus administration of intravenous contrast. RADIATION DOSE REDUCTION: This exam was performed according to the departmental dose-optimization program which includes automated exposure control, adjustment of the mA and/or kV according to patient size and/or use of iterative reconstruction technique. CONTRAST:  139mL OMNIPAQUE IOHEXOL 300 MG/ML  SOLN COMPARISON:  Most recent CT chest, abdomen and pelvis 05/31/2021. 04/24/2016 PET-CT. FINDINGS: CT CHEST FINDINGS Cardiovascular: Right chest port catheter. Aortic atherosclerosis. Left chest multi lead pacer. Normal heart size. Left coronary artery calcifications. No pericardial effusion. Mediastinum/Nodes: No persistently enlarged mediastinal, hilar, or axillary lymph nodes. Thyroid gland, trachea, and esophagus demonstrate no significant findings. Lungs/Pleura: Status post right upper lobectomy with paramedian radiation fibrosis and peripheral scarring of the right lung. Multiple new bilateral spiculated pulmonary nodules, infrahilar nodule of the right lower lobe measuring 2.3 x 1.9 cm (series 6, image 91), a peripheral subpleural nodule of the right lower lobe measuring 1.6 x 1.2 cm (series 6, image 67), a nodule in the costophrenic recess of the right lower lobe measuring 1.3 x 1.2 cm (series 6, image 101), a nodule in the costophrenic recess of the left lower lobe measuring 1.6 x 1.2 cm (series 6, image 117). Unchanged small rounded nodule of the posterior left upper lobe measuring 0.3 cm (series 6, image 24). Mild centrilobular emphysema. No pleural effusion or pneumothorax. Musculoskeletal: No  chest wall mass or suspicious osseous lesions identified. CT ABDOMEN PELVIS FINDINGS Hepatobiliary: Hepatic steatosis. Coarse, nodular contour of the liver. No solid liver abnormality is seen. Gallstones in the dependent gallbladder. No gallbladder wall thickening, or biliary dilatation. Pancreas: Unremarkable. No pancreatic ductal dilatation or surrounding inflammatory changes. Spleen: Normal in size without significant abnormality. Adrenals/Urinary Tract: Adrenal glands are unremarkable. Kidneys are normal, without renal calculi, solid lesion, or hydronephrosis. Bladder is unremarkable. Stomach/Bowel: Stomach is within normal limits. Appendix appears normal. No evidence of bowel wall thickening, distention, or inflammatory changes. Descending and sigmoid diverticulosis. Vascular/Lymphatic: Aortic atherosclerosis. Numerous unchanged prominent portacaval, gastrohepatic ligament, and celiac axis lymph nodes (series 2, image 52). Reproductive: No mass or other abnormality. Other: No abdominal wall hernia or abnormality. No ascites. Musculoskeletal: No acute osseous findings. IMPRESSION: 1. Multiple new bilateral spiculated pulmonary nodules, consistent with metastatic disease. 2. Status post right upper lobectomy with paramedian radiation fibrosis and peripheral scarring of the right lung. 3. No persistently enlarged mediastinal, hilar, or axillary lymph nodes. 4. No  evidence of metastatic disease to the abdomen or pelvis. 5. Numerous unchanged prominent portacaval, gastrohepatic ligament, and celiac axis lymph nodes, likely reactive to cirrhosis, metastatic disease not favored. 6. Hepatic steatosis and cirrhosis. 7. Cholelithiasis. 8. Mild emphysema. Aortic Atherosclerosis (ICD10-I70.0) and Emphysema (ICD10-J43.9). Electronically Signed   By: Delanna Ahmadi M.D.   On: 09/19/2021 10:49   CT Abdomen Pelvis W Contrast  Result Date: 09/19/2021 CLINICAL DATA:  Primary Cancer Type: Lung Imaging Indication: Assess  response to therapy Interval therapy since last imaging? Yes Initial Cancer Diagnosis Date: 10/27/2012; Established by: Biopsy-proven Detailed Pathology: Initial diagnosis stage IB non-small cell lung cancer, adenocarcinoma. Primary Tumor location: Right upper lobe. Recurrence? Yes; Date(s) of recurrence: 05/01/2014 Established by: Biopsy-proven Surgeries: Right upper lobectomy 10/27/2012. Pacemaker 2019. Partial hysterectomy. Chemotherapy: Yes; Ongoing? No; Most recent administration: 09/22/2014 Immunotherapy?  Yes; Type: Nivolumab; Ongoing? Yes Radiation therapy?  Yes; Date Range: 2017 EXAM: CT CHEST, ABDOMEN, AND PELVIS WITH CONTRAST TECHNIQUE: Multidetector CT imaging of the chest, abdomen and pelvis was performed following the standard protocol during bolus administration of intravenous contrast. RADIATION DOSE REDUCTION: This exam was performed according to the departmental dose-optimization program which includes automated exposure control, adjustment of the mA and/or kV according to patient size and/or use of iterative reconstruction technique. CONTRAST:  125mL OMNIPAQUE IOHEXOL 300 MG/ML  SOLN COMPARISON:  Most recent CT chest, abdomen and pelvis 05/31/2021. 04/24/2016 PET-CT. FINDINGS: CT CHEST FINDINGS Cardiovascular: Right chest port catheter. Aortic atherosclerosis. Left chest multi lead pacer. Normal heart size. Left coronary artery calcifications. No pericardial effusion. Mediastinum/Nodes: No persistently enlarged mediastinal, hilar, or axillary lymph nodes. Thyroid gland, trachea, and esophagus demonstrate no significant findings. Lungs/Pleura: Status post right upper lobectomy with paramedian radiation fibrosis and peripheral scarring of the right lung. Multiple new bilateral spiculated pulmonary nodules, infrahilar nodule of the right lower lobe measuring 2.3 x 1.9 cm (series 6, image 91), a peripheral subpleural nodule of the right lower lobe measuring 1.6 x 1.2 cm (series 6, image 67), a nodule  in the costophrenic recess of the right lower lobe measuring 1.3 x 1.2 cm (series 6, image 101), a nodule in the costophrenic recess of the left lower lobe measuring 1.6 x 1.2 cm (series 6, image 117). Unchanged small rounded nodule of the posterior left upper lobe measuring 0.3 cm (series 6, image 24). Mild centrilobular emphysema. No pleural effusion or pneumothorax. Musculoskeletal: No chest wall mass or suspicious osseous lesions identified. CT ABDOMEN PELVIS FINDINGS Hepatobiliary: Hepatic steatosis. Coarse, nodular contour of the liver. No solid liver abnormality is seen. Gallstones in the dependent gallbladder. No gallbladder wall thickening, or biliary dilatation. Pancreas: Unremarkable. No pancreatic ductal dilatation or surrounding inflammatory changes. Spleen: Normal in size without significant abnormality. Adrenals/Urinary Tract: Adrenal glands are unremarkable. Kidneys are normal, without renal calculi, solid lesion, or hydronephrosis. Bladder is unremarkable. Stomach/Bowel: Stomach is within normal limits. Appendix appears normal. No evidence of bowel wall thickening, distention, or inflammatory changes. Descending and sigmoid diverticulosis. Vascular/Lymphatic: Aortic atherosclerosis. Numerous unchanged prominent portacaval, gastrohepatic ligament, and celiac axis lymph nodes (series 2, image 52). Reproductive: No mass or other abnormality. Other: No abdominal wall hernia or abnormality. No ascites. Musculoskeletal: No acute osseous findings. IMPRESSION: 1. Multiple new bilateral spiculated pulmonary nodules, consistent with metastatic disease. 2. Status post right upper lobectomy with paramedian radiation fibrosis and peripheral scarring of the right lung. 3. No persistently enlarged mediastinal, hilar, or axillary lymph nodes. 4. No evidence of metastatic disease to the abdomen or pelvis. 5. Numerous unchanged prominent  portacaval, gastrohepatic ligament, and celiac axis lymph nodes, likely reactive  to cirrhosis, metastatic disease not favored. 6. Hepatic steatosis and cirrhosis. 7. Cholelithiasis. 8. Mild emphysema. Aortic Atherosclerosis (ICD10-I70.0) and Emphysema (ICD10-J43.9). Electronically Signed   By: Delanna Ahmadi M.D.   On: 09/19/2021 10:49   NM PET Image Restage (PS) Skull Base to Thigh (F-18 FDG)  Result Date: 10/08/2021 CLINICAL DATA:  Subsequent treatment strategy for non-small cell lung cancer. EXAM: NUCLEAR MEDICINE PET SKULL BASE TO THIGH TECHNIQUE: 7.2 mCi F-18 FDG was injected intravenously. Full-ring PET imaging was performed from the skull base to thigh after the radiotracer. CT data was obtained and used for attenuation correction and anatomic localization. Fasting blood glucose: 144 mg/dl COMPARISON:  Multiple exams, including 09/19/2021; also remote PET-CT from 04/24/2016 FINDINGS: Mediastinal blood pool activity: SUV max 2.6 Liver activity: SUV max NA NECK: No significant abnormal hypermetabolic activity in this region. Incidental CT findings: Small remote right cerebellar infarct. CHEST: 2.2 by 1.3 cm hypermetabolic right lower lobe nodule along the diaphragm, maximum SUV 15.5. Hypermetabolic 1.5 by 1.5 cm nodule in the right posterior costophrenic angle with maximum SUV 3.8. Hypermetabolic subpleural nodule in the right lower lobe on image 41 series 8 measuring 1.4 by 0.7 cm, maximum SUV 3.8. Hypermetabolic left lower lobe nodule measuring 1.6 by 1.2 cm adjacent to the diaphragm on image 63 series 8, maximum SUV 6.0 An adjacent 0.5 cm subpleural nodule in the left lower lobe on image 49 series 8 has a maximum SUV of 2.6, and given the small size of the nodule, this could reflect malignancy. Mild bandlike density in the lingula, maximum SUV 3.3, cannot exclude underlying nodule. Low-grade activity along the right paramediastinal fibrosis. Incidental CT findings: Right upper lobectomy. Emphysema. Right Port-A-Cath tip: Right atrium. Pacer device noted. Coronary, aortic arch, and  branch vessel atherosclerotic vascular disease. ABDOMEN/PELVIS: Low-grade activity in porta hepatis and peripancreatic lymph nodes which are probably reactive to the patient's cirrhosis. Incidental CT findings: Hepatic cirrhosis. Cholelithiasis. Atherosclerosis is present, including aortoiliac atherosclerotic disease. Systemic atherosclerosis. Contrast medium in the renal collecting systems in urinary bladder likely from the recent head CT with and without contrast. Scattered sigmoid colon diverticula. SKELETON: No significant abnormal hypermetabolic activity in this region. Incidental CT findings: none IMPRESSION: 1. Hypermetabolic primarily subpleural nodules in both lower lobes. A dominant nodule measuring 2.2 by 1.3 cm along the right lower lobe adjacent to the diaphragm has a maximum SUV of 15.5. Other nodules are not as hypermetabolic but still suspicious. 2. No findings of metastatic disease to the neck, abdomen/pelvis, or skeleton. 3. Low-grade activity in lymph nodes along the porta hepatis and peripancreatic region, favoring reactive lymph nodes from the patient's cirrhosis. 4. Other imaging findings of potential clinical significance: Aortic Atherosclerosis (ICD10-I70.0) and Emphysema (ICD10-J43.9). Coronary atherosclerosis. Systemic atherosclerosis. Hepatic cirrhosis. Cholelithiasis. Prior right upper lobectomy along with right paramediastinal fibrosis. Small remote right cerebellar infarct. Electronically Signed   By: Van Clines M.D.   On: 10/08/2021 15:37    ASSESSMENT AND PLAN: This is a very pleasant 76 years old white female with metastatic non-small cell lung cancer, adenocarcinoma with no actionable mutations status post systemic chemotherapy with carboplatin and Alimta and she is currently undergoing treatment with second line immunotherapy with Nivolumab status post 6 cycles. This was followed by immunotherapy with Nivolumab 480 mg IV every 4 weeks status post 58 cycles. The patient  has been tolerating her treatment well except for mild skin rash. Her last CT scans showed multiple new  bilateral spiculated pulmonary nodules suspicious for metastatic disease.  The patient also mentions that she had recent chest congestion and cough.  Inflammatory process could not be completely excluded at this point. The patient had a PET scan that showed hypermetabolic primarily subpleural nodules in both lower lobes with a dominant nodule measuring 2.2 x 1.3 cm along the right lower lobe adjacent to the diaphragm with maximum SUV of 15.5.  Other nodules are not hypermetabolic but still suspicious and no evidence of metastatic disease to the neck, abdomen and pelvis or skeleton. I discussed the PET scan results with the patient today. I also discussed with her several options for treatment of her condition including SBRT to the enlarging pulmonary nodules with continuation of the immunotherapy and close monitoring of the other versus switching her treatment to systemic chemotherapy likely with single agent Alimta at this point. The patient is interested in continuing the immunotherapy and considering SBRT to the enlarging and suspicious pulmonary nodules. I will refer her to radiation oncology for discussion of this option. She will resume her treatment with immunotherapy next week. For the cardiac arrhythmia, she is followed by Dr. [name]. The patient was advised to call immediately if she has any other concerning symptoms in the interval.  The patient voices understanding of current disease status and treatment options and is in agreement with the current care plan.  All questions were answered. The patient knows to call the clinic with any problems, questions or concerns. We can certainly see the patient much sooner if necessary.  Disclaimer: This note was dictated with voice recognition software. Similar sounding words can inadvertently be transcribed and may not be corrected upon  review.

## 2021-10-21 ENCOUNTER — Other Ambulatory Visit: Payer: Self-pay | Admitting: Medical Oncology

## 2021-10-21 DIAGNOSIS — C3491 Malignant neoplasm of unspecified part of right bronchus or lung: Secondary | ICD-10-CM

## 2021-10-21 DIAGNOSIS — E032 Hypothyroidism due to medicaments and other exogenous substances: Secondary | ICD-10-CM

## 2021-10-21 DIAGNOSIS — Z5112 Encounter for antineoplastic immunotherapy: Secondary | ICD-10-CM

## 2021-10-22 ENCOUNTER — Inpatient Hospital Stay (HOSPITAL_BASED_OUTPATIENT_CLINIC_OR_DEPARTMENT_OTHER): Payer: Medicare Other | Admitting: Internal Medicine

## 2021-10-22 ENCOUNTER — Other Ambulatory Visit: Payer: Self-pay

## 2021-10-22 ENCOUNTER — Inpatient Hospital Stay: Payer: Medicare Other

## 2021-10-22 ENCOUNTER — Encounter: Payer: Self-pay | Admitting: *Deleted

## 2021-10-22 ENCOUNTER — Encounter: Payer: Self-pay | Admitting: Internal Medicine

## 2021-10-22 ENCOUNTER — Ambulatory Visit: Payer: Medicare Other | Admitting: Physician Assistant

## 2021-10-22 VITALS — BP 126/53 | HR 66 | Temp 97.8°F | Resp 17 | Ht 62.0 in | Wt 145.8 lb

## 2021-10-22 DIAGNOSIS — Z5112 Encounter for antineoplastic immunotherapy: Secondary | ICD-10-CM | POA: Diagnosis not present

## 2021-10-22 DIAGNOSIS — C3491 Malignant neoplasm of unspecified part of right bronchus or lung: Secondary | ICD-10-CM

## 2021-10-22 DIAGNOSIS — E032 Hypothyroidism due to medicaments and other exogenous substances: Secondary | ICD-10-CM

## 2021-10-22 LAB — CBC WITH DIFFERENTIAL (CANCER CENTER ONLY)
Abs Immature Granulocytes: 0.02 10*3/uL (ref 0.00–0.07)
Basophils Absolute: 0.1 10*3/uL (ref 0.0–0.1)
Basophils Relative: 1 %
Eosinophils Absolute: 0.3 10*3/uL (ref 0.0–0.5)
Eosinophils Relative: 4 %
HCT: 40.8 % (ref 36.0–46.0)
Hemoglobin: 14.1 g/dL (ref 12.0–15.0)
Immature Granulocytes: 0 %
Lymphocytes Relative: 19 %
Lymphs Abs: 1.2 10*3/uL (ref 0.7–4.0)
MCH: 33.5 pg (ref 26.0–34.0)
MCHC: 34.6 g/dL (ref 30.0–36.0)
MCV: 96.9 fL (ref 80.0–100.0)
Monocytes Absolute: 0.4 10*3/uL (ref 0.1–1.0)
Monocytes Relative: 7 %
Neutro Abs: 4.5 10*3/uL (ref 1.7–7.7)
Neutrophils Relative %: 69 %
Platelet Count: 172 10*3/uL (ref 150–400)
RBC: 4.21 MIL/uL (ref 3.87–5.11)
RDW: 12.8 % (ref 11.5–15.5)
WBC Count: 6.5 10*3/uL (ref 4.0–10.5)
nRBC: 0 % (ref 0.0–0.2)

## 2021-10-22 LAB — CMP (CANCER CENTER ONLY)
ALT: 32 U/L (ref 0–44)
AST: 40 U/L (ref 15–41)
Albumin: 4 g/dL (ref 3.5–5.0)
Alkaline Phosphatase: 100 U/L (ref 38–126)
Anion gap: 5 (ref 5–15)
BUN: 10 mg/dL (ref 8–23)
CO2: 30 mmol/L (ref 22–32)
Calcium: 9.5 mg/dL (ref 8.9–10.3)
Chloride: 105 mmol/L (ref 98–111)
Creatinine: 0.68 mg/dL (ref 0.44–1.00)
GFR, Estimated: >60 mL/min (ref >60)
Glucose, Bld: 154 mg/dL — ABNORMAL HIGH (ref 70–99)
Potassium: 4.5 mmol/L (ref 3.5–5.1)
Sodium: 140 mmol/L (ref 135–145)
Total Bilirubin: 1.2 mg/dL (ref 0.3–1.2)
Total Protein: 7.4 g/dL (ref 6.5–8.1)

## 2021-10-22 LAB — TSH: TSH: 5.219 u[IU]/mL — ABNORMAL HIGH (ref 0.308–3.960)

## 2021-10-22 MED ORDER — SODIUM CHLORIDE 0.9 % IV SOLN
480.0000 mg | Freq: Once | INTRAVENOUS | Status: AC
  Administered 2021-10-22: 480 mg via INTRAVENOUS
  Filled 2021-10-22: qty 48

## 2021-10-22 MED ORDER — SODIUM CHLORIDE 0.9% FLUSH
10.0000 mL | INTRAVENOUS | Status: DC | PRN
  Administered 2021-10-22: 10 mL

## 2021-10-22 MED ORDER — BENZONATATE 200 MG PO CAPS: 200.0000 mg | ORAL_CAPSULE | Freq: Three times a day (TID) | ORAL | 0 refills | Status: DC | PRN

## 2021-10-22 MED ORDER — HEPARIN SOD (PORK) LOCK FLUSH 100 UNIT/ML IV SOLN
500.0000 [IU] | Freq: Once | INTRAVENOUS | Status: AC | PRN
  Administered 2021-10-22: 500 [IU]

## 2021-10-22 MED ORDER — SODIUM CHLORIDE 0.9 % IV SOLN: Freq: Once | INTRAVENOUS | Status: AC

## 2021-10-22 NOTE — Patient Instructions (Signed)
[facility]  Discharge Instructions: ?Thank you for choosing [facility] to provide your oncology and hematology care.  ? ?If you have a lab appointment with the [facility], please go directly to the [facility] and check in at the registration area. ?  ?Wear comfortable clothing and clothing appropriate for easy access to any Portacath or PICC line.  ? ?We strive to give you quality time with your provider. You may need to reschedule your appointment if you arrive late (15 or more minutes).  Arriving late affects you and other patients whose appointments are after yours.  Also, if you miss three or more appointments without notifying the office, you may be dismissed from the clinic at the provider?s discretion.    ?  ?For prescription refill requests, have your pharmacy contact our office and allow 72 hours for refills to be completed.   ? ?Today you received the following chemotherapy and/or immunotherapy agent: Opdivo    ?  ?To help prevent nausea and vomiting after your treatment, we encourage you to take your nausea medication as directed. ? ?BELOW ARE SYMPTOMS THAT SHOULD BE REPORTED IMMEDIATELY: ?*FEVER GREATER THAN 100.4 F (38 ?C) OR HIGHER ?*CHILLS OR SWEATING ?*NAUSEA AND VOMITING THAT IS NOT CONTROLLED WITH YOUR NAUSEA MEDICATION ?*UNUSUAL SHORTNESS OF BREATH ?*UNUSUAL BRUISING OR BLEEDING ?*URINARY PROBLEMS (pain or burning when urinating, or frequent urination) ?*BOWEL PROBLEMS (unusual diarrhea, constipation, pain near the anus) ?TENDERNESS IN MOUTH AND THROAT WITH OR WITHOUT PRESENCE OF ULCERS (sore throat, sores in mouth, or a toothache) ?UNUSUAL RASH, SWELLING OR PAIN  ?UNUSUAL VAGINAL DISCHARGE OR ITCHING  ? ?Items with * indicate a potential emergency and should be followed up as soon as possible or go to the Emergency Department if any problems should occur. ? ?Please show the CHEMOTHERAPY ALERT CARD or IMMUNOTHERAPY ALERT CARD at check-in to the  Emergency Department and triage nurse. ? ?Should you have questions after your visit or need to cancel or reschedule your appointment, please contact [facility]  Dept: [phone]  and follow the prompts.  Office hours are 8:00 a.m. to 4:30 p.m. Monday - Friday. Please note that voicemails left after 4:00 p.m. may not be returned until the following business day.  We are closed weekends and major holidays. You have access to a nurse at all times for urgent questions. Please call the main number to the clinic Dept: [phone] and follow the prompts. ? ? ?For any non-urgent questions, you may also contact your provider using MyChart. We now offer e-Visits for anyone 55 and older to request care online for non-urgent symptoms. For details visit [email or web address]. ?  ?Also download the MyChart app! Go to the app store, search "MyChart", open the app, select [facility], and log in with your MyChart username and password. ? ?Due to Covid, a mask is required upon entering the hospital/clinic. If you do not have a mask, one will be given to you upon arrival. For doctor visits, patients may have 1 support person aged 42 or older with them. For treatment visits, patients cannot have anyone with them due to current Covid guidelines and our immunocompromised population.  ? ?

## 2021-10-22 NOTE — Progress Notes (Signed)
? ?    [name] ?[phone]   [phone] ? ?OFFICE PROGRESS NOTE ? ?[name], [name] ?[address] ?[place] [address] ? ?DIAGNOSIS: Metastatic non-small cell lung cancer, adenocarcinoma. This was initially diagnosed as stage IB (T2a, N0, M0) in March of 2014, status post right upper lobectomy with lymph node dissection but the patient has evidence for disease recurrence in the right axilla status post resection. ? ?PRIOR THERAPY:  ?1) Status post right axillary lymph node biopsy on 05/01/2014 under the care of Dr. Roxan Hockey. ?2) Systemic chemotherapy with carboplatin for AUC of 5 and Alimta 500 mg/M2 every 3 weeks. First cycle 05/31/2014. Status post 6 cycles. Last dose was given 09/22/2014. ?3) palliative radiotherapy to the subcarinal lymph node under the care of Dr. Sondra Come. ?4) Nivolumab 240 mg IV every 2 weeks. First dose 12/23/2016. Status post 6 cycles. ? ?CURRENT THERAPY:  Nivolumab 480 mg IV every 4 weeks. First dose 03/17/2017. Status post 58 cycles. ? ?INTERVAL HISTORY: ?Nancy Hodges 76 y.o. female returns to the clinic today for follow-up visit.  The patient is feeling fine today with no concerning complaints except for chest congestion and cough.  She denied having any recent fever or chills except for 1 time.  She has no nausea, vomiting, diarrhea or constipation.  She has no chest pain, shortness of breath or hemoptysis.  She has no recent weight loss or night sweats.  She was referred to Dr. Genene Churn for consideration of SBRT to the enlarging pulmonary nodules.  She is expected to see him next week. The patient is here today for evaluation before starting cycle #59. ? ? ?MEDICAL HISTORY: ?Past Medical History:  ?Diagnosis Date  ? Allergic rhinitis   ? Anxiety   ? Arthritis   ? Asthma   ? Atrial flutter ([place])   ? Cholelithiases 09/26/2015  ? Diverticulosis   ? Encounter for antineoplastic immunotherapy 12/09/2016  ? GERD (gastroesophageal reflux  disease)   ? H/O hiatal hernia   ? History of blood transfusion   ? History of chemotherapy   ? History of kidney stones   ? History of radiation therapy   ? Internal hemorrhoid   ? Lung cancer ([facility])   ? a. Stage IB non-small cell carcinoma, s/p R VATS, wedge resection, RU lobectomy 10/2012.  ? Persistent atrial fibrillation ([place])   ? a. anticoagulated with Xarelto  ? Pneumonia 06/2016  ? [facility]  ? Presence of permanent cardiac pacemaker   ? Pulmonary nodule   ? Tachycardia-bradycardia syndrome ([place])   ? a. 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  ? ? ?ALLERGIES:  is allergic to chlorpheniramine-dm, prednisone, rofecoxib, septra [sulfamethoxazole-trimethoprim], fluticasone, molds & smuts, and penicillins. ? ?MEDICATIONS:  ?Current Outpatient Medications  ?Medication Sig Dispense Refill  ? acetaminophen (TYLENOL) 500 MG tablet Take 500 mg by mouth 3 (three) times daily as needed for moderate pain or fever.     ? albuterol (PROVENTIL HFA;VENTOLIN HFA) 108 (90 BASE) MCG/ACT inhaler Inhale 2 puffs into the lungs every 6 (six) hours as needed for wheezing or shortness of breath.     ? azelastine (ASTELIN) 0.1 % nasal spray Place 1 spray into both nostrils 2 (two) times daily. Use in each nostril as directed    ? calcium carbonate (TUMS - DOSED IN MG ELEMENTAL CALCIUM) 500 MG chewable tablet Chew 1 tablet by mouth 2 (two) times daily as needed for indigestion or heartburn.     ? diltiazem (  CARTIA XT) 240 MG 24 hr capsule Take 1 capsule (240 mg total) by mouth daily. 90 capsule 3  ? hydrocortisone 1 % lotion Apply 1 application topically 2 (two) times daily. 118 mL 0  ? ipratropium-albuterol (DUONEB) 0.5-2.5 (3) MG/3ML SOLN Take 3 mLs by nebulization every 6 (six) hours as needed (shortness of breath or wheezing).     ? levothyroxine (SYNTHROID) 50 MCG tablet TAKE 1 TABLET(50 MCG) BY MOUTH DAILY BEFORE AND BREAKFAST 90 tablet 0  ? lidocaine-prilocaine (EMLA) cream APPLY TOPICALLY TO TO THE AFFECTED AREA AS NEEDED  30 g 4  ? loratadine (CLARITIN) 10 MG tablet Take 10 mg by mouth daily.    ? OVER THE COUNTER MEDICATION Take 5 mLs by mouth daily as needed (cough). [employer] kids cough syrup    ? Polyvinyl Alcohol-Povidone (REFRESH OP) Place 1 drop into both eyes daily as needed (dry eyes).    ? XARELTO 20 MG TABS tablet TAKE 1 TABLET(20 MG) BY MOUTH DAILY WITH SUPPER 30 tablet 5  ? dimenhyDRINATE (DRAMAMINE) 50 MG tablet Take 12.5 mg by mouth every 8 (eight) hours as needed for dizziness. (Patient not taking: Reported on 10/22/2021)    ? meclizine (ANTIVERT) 12.5 MG tablet 1 tablet as needed (Patient not taking: Reported on 10/22/2021)    ? ?No current facility-administered medications for this visit.  ? ? ?SURGICAL HISTORY:  ?Past Surgical History:  ?Procedure Laterality Date  ? CARDIOVERSION N/A 02/19/2018  ? Procedure: CARDIOVERSION;  Surgeon: Sanda Klein, MD;  Location: [facility];  Service: Cardiovascular;  Laterality: N/A;  ? CARDIOVERSION N/A 04/30/2018  ? Procedure: CARDIOVERSION;  Surgeon: Thayer Headings, MD;  Location: [facility];  Service: Cardiovascular;  Laterality: N/A;  ? CATARACT EXTRACTION W/PHACO Right 08/14/2016  ? Procedure: CATARACT EXTRACTION PHACO AND INTRAOCULAR LENS PLACEMENT RIGHT EYE CDE 10.53;  Surgeon: Tonny Branch, MD;  Location: [facility];  Service: Ophthalmology;  Laterality: Right;  right  ? CATARACT EXTRACTION W/PHACO Left 09/25/2016  ? Procedure: CATARACT EXTRACTION PHACO AND INTRAOCULAR LENS PLACEMENT (IOC);  Surgeon: Tonny Branch, MD;  Location: [facility];  Service: Ophthalmology;  Laterality: Left;  left ?CDE 8.81  ? COLONOSCOPY    ? [facility]: cattered sigmoid diverticula, internal hemorrhoids, sessile polyp in ascending and mid transverse colon (tubular adenoma).   ? colonoscopy with polypectomy  2015  ? ESOPHAGOGASTRODUODENOSCOPY    ? [facility]: bile reflux in stomach, mild gastritis, hiatal hernia  ? ESOPHAGOGASTRODUODENOSCOPY N/A 01/31/2016  ? Procedure: ESOPHAGOGASTRODUODENOSCOPY (EGD);   Surgeon: [name], MD;  [facility];  Service: Endoscopy;  Laterality: N/A;  1200  ? IR FLUORO GUIDE PORT INSERTION RIGHT  06/30/2017  ? IR US GUIDE VASC ACCESS RIGHT  06/30/2017  ? LOBECTOMY Right 10/27/2012  ? Procedure: LOBECTOMY;  Surgeon: [name], MD;  Location: [facility];  Service: Thoracic;  Laterality: Right;   RIGHT UPPER LOBECTOMY & Node Dissection  ? LYMPH NODE BIOPSY Right 05/01/2014  ? Procedure: LYMPH NODE BIOPSY, Right Axillary;  Surgeon: [name], MD;  Location: [facility];  Service: Thoracic;  Laterality: Right;  ? PACEMAKER IMPLANT N/A 10/30/2017  ? SJM Assurity MRI PPM implanted by Dr [name] once leadless pacemaker battery depleted  ? PARTIAL HYSTERECTOMY    ? PERMANENT PACEMAKER INSERTION N/A 09/14/2012  ? Nanostim (SJM) leadless pacemaker (LEADLESS II STUDY PATIENT) implanted by Dr [name], no longer functioning, device abandoned.  ? VIDEO ASSISTED THORACOSCOPY (VATS)/WEDGE RESECTION Right 10/27/2012  ? Procedure: VIDEO ASSISTED THORACOSCOPY (VATS),RGHT UPPER LOBE  LUNG WEDGE RESECTION;  Surgeon: [name], MD;  Location: [facility];  Service: Thoracic;  Laterality: Right;  ? ? ?REVIEW OF SYSTEMS:  A comprehensive review of systems was negative except for: Constitutional: positive for fatigue ?Ears, nose, mouth, throat, and face: positive for nasal congestion ?Respiratory: positive for cough  ? ?PHYSICAL EXAMINATION: General appearance: alert, cooperative, fatigued, and no distress ?Head: Normocephalic, without obvious abnormality, atraumatic ?Neck: no adenopathy, no JVD, supple, symmetrical, trachea midline, and thyroid not enlarged, symmetric, no tenderness/mass/nodules ?Lymph nodes: Cervical, supraclavicular, and axillary nodes normal. ?Resp: clear to auscultation bilaterally ?Back: symmetric, no curvature. ROM normal. No CVA tenderness. ?Cardio: regular rate and rhythm, S1, S2 normal, no murmur, click, rub or gallop ?GI: soft, non-tender; bowel sounds normal;  no masses,  no organomegaly ?Extremities: extremities normal, atraumatic, no cyanosis or edema ? ?ECOG PERFORMANCE STATUS: 1 - Symptomatic but completely ambulatory ? ?Blood pressure (!) 126/53, pulse 66, t

## 2021-10-22 NOTE — Progress Notes (Signed)
Oncology Nurse Navigator Documentation ? ?Oncology Nurse Navigator Flowsheets 10/22/2021 09/30/2021 09/25/2021 09/24/2021 07/30/2021 11/22/2019 08/02/2019  ?Abnormal Finding Date - - - - - - 09/15/2012  ?Confirmed Diagnosis Date - - - - - - 10/27/2012  ?Diagnosis Status - - - - - - Confirmed Diagnosis Complete  ?Planned Course of Treatment - - - - - - Chemotherapy;Surgery;Targeted Therapy  ?Phase of Treatment - - - - - - Targeted Therapy  ?Chemotherapy Actual Start Date: - - - - - - 05/31/2014  ?Chemotherapy Actual End Date: - - - - - - 09/22/2014  ?Targeted Therapy Actual Start Date: - - - - - - 12/23/2016  ?Surgery Actual Start Date: - - - - - - 10/27/2012  ?Navigator Follow Up Date: - - - - - - 08/02/2019  ?Navigator Follow Up Reason: - - - - - - Follow-up Appointment  ?Navigation Complete Date: - - - - - - 08/02/2019  ?Post Navigation: Continue to Follow Patient? - - - - - - Yes  ?Navigator Location [facility]  ?Navigator Encounter Type Clinic/MDC Telephone Telephone Clinic/MDC Clinic/MDC Clinic/MDC Clinic/MDC  ?Telephone Clinic/MDC Follow-up Outgoing Call;Incoming Call [facility] Call - - - -  ?Treatment Initiated Date - - - - - - 05/31/2014  ?Patient Visit Type MedOnc Other Other [facility]  ?Treatment Phase Treatment Treatment Other Treatment Treatment Treatment Treatment  ?Barriers/Navigation Needs Air traffic controller Education - Education No Barriers At This Time  ?Education Contractor;Other Other Other Other - Other -  ?Interventions Education;Psycho-Social Support/I spoke to Ms. Kracke today.  I help to explain her plan of care. She verbalized understanding.  Coordination of Care;Education;Psycho-Social Support Education;Coordination of Care Education;Psycho-Social Support Psycho-Social Support  Education;Psycho-Social Support None Required  ?Acuity Level 2-Minimal Needs (1-2 Barriers Identified) Level 2-Minimal Needs (1-2 Barriers Identified) Level 2-Minimal Needs (1-2 Barriers Identified) Level 2-Minimal Needs (1-2 Barriers Identified) Level 3-Moderate Needs (3-4 Barriers Identified) Level 2-Minimal Needs (1-2 Barriers Identified) Level 1-No Barriers  ?Coordination of Care - Appts Other - - - -  ?Education Method Verbal Verbal Verbal Verbal - - -  ?Time Spent with Patient 30 30 30 30 30 15 30   ?  ?

## 2021-10-23 NOTE — Progress Notes (Addendum)
Location of tumor and Histology per Pathology Report: Metastatic non-small cell lung cancer, adenocarcinoma. This was initially diagnosed as stage IB (T2a, N0, M0) in March of 2014, status post right upper lobectomy with lymph node dissection but the patient has evidence for disease recurrence in the right axilla status post resection. ? ? ?Past/Anticipated interventions by surgeon, if any:  initially diagnosed as stage IB (T2a, N0, M0) in March of 2014, status post right upper lobectomy with lymph node dissection ? ?Past/Anticipated interventions by medical oncology, if any: Dr Julien Nordmann ?PRIOR THERAPY:  ?1) Status post right axillary lymph node biopsy on 05/01/2014 under the care of Dr. Roxan Hockey. ?2) Systemic chemotherapy with carboplatin for AUC of 5 and Alimta 500 mg/M2 every 3 weeks. First cycle 05/31/2014. Status post 6 cycles. Last dose was given 09/22/2014. ?3) palliative radiotherapy to the subcarinal lymph node under the care of Dr. Sondra Come. ?4) Nivolumab 240 mg IV every 2 weeks. First dose 12/23/2016. Status post 6 cycles. ?  ?CURRENT THERAPY:  Nivolumab 480 mg IV every 4 weeks. First dose 03/17/2017. Status post 58 cycles. ? ?She was referred to Dr. Sondra Come for consideration of SBRT to the enlarging pulmonary nodules. ? ?Pain issues, if any:   intermittent chest, knee, shoulder pain ? ?SAFETY ISSUES: ?Prior radiation? palliative radiotherapy to the subcarinal lymph node under the care of Dr. Sondra Come 2015 ?Pacemaker/ICD? yes ?Possible current pregnancy? no ?Is the patient on methotrexate? no ? ?Current Complaints / other details:  productive cough with green phlegm, shortness of breath with activity, occasional tingling in feet   ? ?Vitals:  ? 10/28/21 1225  ?BP: (!) 149/60  ?Pulse: 76  ?Resp: 18  ?Temp: (!) 96.4 ?F (35.8 ?C)  ?TempSrc: Temporal  ?SpO2: 99%  ?Weight: 147 lb (66.7 kg)  ?Height: 5\' 2"  (1.575 m)  ? ? ? ?

## 2021-10-27 NOTE — Progress Notes (Signed)
?Radiation Oncology         ([phone]) [phone] ?________________________________ ? ?Initial Outpatient Consultation ? ?Name: Nancy Hodges MRN: [id number]  ?Date: 10/28/2021  DOB: [birth year] ? ?OH:YWVPXT, Lehman Prom, [name]  Curt Bears, MD  ? ?REFERRING PHYSICIAN: Curt Bears, MD ? ?DIAGNOSIS: The primary encounter diagnosis was Non-small cell carcinoma of right lung, stage 4 ([place]). A diagnosis of Primary malignant neoplasm of lung metastatic to other site, unspecified laterality [facility]) was also pertinent to this visit. ? ?Metastatic non-small cell lung cancer, adenocarcinoma. This was initially diagnosed as stage IB (T2a, N0, M0) in March of 2014, status post right upper lobectomy with lymph node dissection but the patient has evidence for disease recurrence in the right axilla status post resection. Now with recent evidence of various enlarging pulmonary nodules in February 2023.  ? ? Cancer Staging  ?Non-small cell carcinoma of right lung, stage 4 ([place]) ?Staging form: Lung, AJCC 7th Edition ?- Clinical: Stage IV (T2a, N0, M1b) - Signed by Curt Bears, MD on 05/13/2014 ?- Pathologic: No stage assigned - Unsigned ? ? ? ?HISTORY OF PRESENT ILLNESS::Nancy Hodges is a 76 y.o. female who is accompanied by no one. she is seen as a courtesy of Dr. Julien Nordmann for an opinion concerning radiation therapy as part of management for enlarging pulmonary nodules. I have seen the patient twice in consultation before: in 2015 following her initial lung cancer diagnosis, and again in 2017 for her first recurrence in the right axilla. Following our consultation in 2017, the patient proceeded with palliative RT to the subcarinal lymph node in [place]. She continued with treatment under Dr. Julien Nordmann consisting of Nivolumab 240 mg IV every 2 weeks starting on 12/23/2016 for 6 cycles. Since 03/17/2017, the patient has continued on with treatment consisting of Nivolumab 480 mg IV every 4 weeks (s/p 58 cycles), follow up  imaging, and subsequent follow-up visits with Dr. Julien Nordmann. Her recent recurrence is detailed as follows. ? ?The patient presented for a routine follow up CT of the chest, abdomen, and pelvis on 09/19/21 which showed multiple new bilateral spiculated pulmonary nodules, consistent with metastatic disease. Bilateral nodules included: a nodule of the RLL measuring 2.3 x 1.9 cm, a peripheral subpleural nodule of the right lower lobe measuring 1.6 x 1.2 cm, a nodule in the costophrenic recess of the RLL measuring 1.3 x 1.2 cm, and a nodule in the costophrenic recess of the left lower lobe measuring 1.6 x 1.2 cm. An unchanged small rounded nodule of the posterior left upper lobe measuring 0.3 cm was also appreciated. CT also showed: paramedian radiation fibrosis and peripheral scarring of the right lung, and numerous unchanged prominent portacaval, gastrohepatic ligament, and celiac axis lymph nodes, likely reactive to cirrhosis, metastatic disease not favored. Otherwise, no evidence of metastatic disease to the abdomen or pelvis, or persistently enlarged mediastinal, hilar, or axillary lymph nodes were appreciated.  ? ?Accordingly, the patient met with Dr. Julien Nordmann on 09/24/21 to discuss these findings. During this visit, the patient endorsed recent chest congestion and cough. Following review, Dr. Julien Nordmann recommended repeat PET and a CT of the head to rule out disease progression.  ? ?Subsequent PET on 10/08/21 showed hypermetabolic primarily subpleural nodules in both lower lobes. In particular, a dominant nodule measuring 2.2 x 1.3 cm along the right lower lobe adjacent to the diaphragm was appreciated with an SUV of 15.5. Other nodules ?were noted as not as hypermetabolic but still suspicious for malignancy. Otherwise, PET showed no findings of metastatic disease  to the neck, abdomen/pelvis, or skeleton. Of note: PET also showed low-grade activity in lymph nodes along the porta hepatis and ?peripancreatic region,  favoring reactive lymph nodes from the patient's hx of cirrhosis.  ? ?CT of the head also performed on 10/08/21 showed no evidence of acute intracranial abnormality or metastatic disease.  ? ?Of note: patient has a pacemaker ? ? ?CURRENT THERAPY:  Nivolumab 480 mg IV every 4 weeks. First dose 03/17/2017. Status post 58 cycles. ? ?PREVIOUS RADIATION THERAPY: Yes, palliative radiotherapy to the subcarinal lymph node at [facility] in 2017 Details concerning this treatment are pending at this time. ? ?PRIOR THERAPY:  ?1) Status post right axillary lymph node biopsy on 05/01/2014 under the care of Dr. Roxan Hockey. ?2) Systemic chemotherapy with carboplatin for AUC of 5 and Alimta 500 mg/M2 every 3 weeks. First cycle 05/31/2014. Status post 6 cycles. Last dose was given 09/22/2014. ?3) Nivolumab 240 mg IV every 2 weeks. First dose 12/23/2016. Status post 6 cycles. ? ?PAST MEDICAL HISTORY:  ?Past Medical History:  ?Diagnosis Date  ? Allergic rhinitis   ? Anxiety   ? Arthritis   ? Asthma   ? Atrial flutter ([place])   ? Cholelithiases 09/26/2015  ? Diverticulosis   ? Encounter for antineoplastic immunotherapy 12/09/2016  ? GERD (gastroesophageal reflux disease)   ? H/O hiatal hernia   ? History of blood transfusion   ? History of chemotherapy   ? History of kidney stones   ? History of radiation therapy   ? Internal hemorrhoid   ? Lung cancer ([place])   ? a. Stage IB non-small cell carcinoma, s/p R VATS, wedge resection, RU lobectomy 10/2012.  ? Persistent atrial fibrillation ([place])   ? a. anticoagulated with Xarelto  ? Pneumonia 06/2016  ? [facility]  ? Presence of permanent cardiac pacemaker   ? Pulmonary nodule   ? Tachycardia-bradycardia syndrome ([place])   ? a. 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  ? ? ?PAST SURGICAL HISTORY: ?Past Surgical History:  ?Procedure Laterality Date  ? CARDIOVERSION N/A 02/19/2018  ? Procedure: CARDIOVERSION;  Surgeon: Sanda Klein, MD;  Location: [place];  Service: Cardiovascular;   Laterality: N/A;  ? CARDIOVERSION N/A 04/30/2018  ? Procedure: CARDIOVERSION;  Surgeon: Thayer Headings, MD;  Location: [place];  Service: Cardiovascular;  Laterality: N/A;  ? CATARACT EXTRACTION W/PHACO Right 08/14/2016  ? Procedure: CATARACT EXTRACTION PHACO AND INTRAOCULAR LENS PLACEMENT RIGHT EYE CDE 10.53;  Surgeon: Tonny Branch, MD;  [facility];  Service: Ophthalmology;  Laterality: Right;  right  ? CATARACT EXTRACTION W/PHACO Left 09/25/2016  ? Procedure: CATARACT EXTRACTION PHACO AND INTRAOCULAR LENS PLACEMENT (IOC);  Surgeon: Tonny Branch, MD;  [facility]: [facility];  Service: Ophthalmology;  Laterality: Left;  left ?CDE 8.81  ? COLONOSCOPY    ? [facility]: cattered sigmoid diverticula, internal hemorrhoids, sessile polyp in ascending and mid transverse colon (tubular adenoma).   ? colonoscopy with polypectomy  2015  ? ESOPHAGOGASTRODUODENOSCOPY    ? [facility]: bile reflux in stomach, mild gastritis, hiatal hernia  ? ESOPHAGOGASTRODUODENOSCOPY N/A 01/31/2016  ? Procedure: ESOPHAGOGASTRODUODENOSCOPY (EGD);  Surgeon: [name], MD;  [facility];  Service: Endoscopy;  Laterality: N/A;  1200  ? IR FLUORO GUIDE PORT INSERTION RIGHT  06/30/2017  ? IR US GUIDE VASC ACCESS RIGHT  06/30/2017  ? LOBECTOMY Right 10/27/2012  ? Procedure: LOBECTOMY;  Surgeon: [name], MD;  Location: [place];  Service: Thoracic;  Laterality: Right;   RIGHT UPPER LOBECTOMY & Node  Dissection  ? LYMPH NODE BIOPSY Right 05/01/2014  ? Procedure: LYMPH NODE BIOPSY, Right Axillary;  Surgeon: [name], MD;  Location: [place];  Service: Thoracic;  Laterality: Right;  ? PACEMAKER IMPLANT N/A 10/30/2017  ? SJM Assurity MRI PPM implanted by Dr [name] once leadless pacemaker battery depleted  ? PARTIAL HYSTERECTOMY    ? PERMANENT PACEMAKER INSERTION N/A 09/14/2012  ? Nanostim (SJM) leadless pacemaker (LEADLESS II STUDY PATIENT) implanted by Dr [name], no longer functioning, device abandoned.  ? VIDEO ASSISTED  THORACOSCOPY (VATS)/WEDGE RESECTION Right 10/27/2012  ? Procedure: VIDEO ASSISTED THORACOSCOPY (VATS),RGHT UPPER LOBE LUNG WEDGE RESECTION;  Surgeon: [name], MD;  Location: [place];  Service: Thoracic;  Laterality: [place]

## 2021-10-28 ENCOUNTER — Other Ambulatory Visit: Payer: Self-pay

## 2021-10-28 ENCOUNTER — Ambulatory Visit
Admission: RE | Admit: 2021-10-28 | Discharge: 2021-10-28 | Disposition: A | Discharge status: Home / Self Care | Payer: Medicare Other | Source: Ambulatory Visit | Attending: Radiation Oncology | Admitting: Radiation Oncology

## 2021-10-28 ENCOUNTER — Other Ambulatory Visit: Payer: Self-pay | Admitting: Internal Medicine

## 2021-10-28 ENCOUNTER — Encounter: Payer: Self-pay | Admitting: Radiation Oncology

## 2021-10-28 VITALS — BP 149/60 | HR 76 | Temp 96.4°F | Resp 18 | Ht 62.0 in | Wt 147.0 lb

## 2021-10-28 DIAGNOSIS — Z7901 Long term (current) use of anticoagulants: Secondary | ICD-10-CM | POA: Diagnosis not present

## 2021-10-28 DIAGNOSIS — Z8673 Personal history of transient ischemic attack (TIA), and cerebral infarction without residual deficits: Secondary | ICD-10-CM | POA: Insufficient documentation

## 2021-10-28 DIAGNOSIS — I6782 Cerebral ischemia: Secondary | ICD-10-CM | POA: Insufficient documentation

## 2021-10-28 DIAGNOSIS — I4892 Unspecified atrial flutter: Secondary | ICD-10-CM | POA: Insufficient documentation

## 2021-10-28 DIAGNOSIS — I2510 Atherosclerotic heart disease of native coronary artery without angina pectoris: Secondary | ICD-10-CM | POA: Insufficient documentation

## 2021-10-28 DIAGNOSIS — Z923 Personal history of irradiation: Secondary | ICD-10-CM | POA: Diagnosis not present

## 2021-10-28 DIAGNOSIS — C3491 Malignant neoplasm of unspecified part of right bronchus or lung: Secondary | ICD-10-CM | POA: Insufficient documentation

## 2021-10-28 DIAGNOSIS — K8020 Calculus of gallbladder without cholecystitis without obstruction: Secondary | ICD-10-CM | POA: Insufficient documentation

## 2021-10-28 DIAGNOSIS — C3490 Malignant neoplasm of unspecified part of unspecified bronchus or lung: Secondary | ICD-10-CM

## 2021-10-28 DIAGNOSIS — Z8051 Family history of malignant neoplasm of kidney: Secondary | ICD-10-CM | POA: Insufficient documentation

## 2021-10-28 DIAGNOSIS — Z87891 Personal history of nicotine dependence: Secondary | ICD-10-CM | POA: Insufficient documentation

## 2021-10-28 DIAGNOSIS — R599 Enlarged lymph nodes, unspecified: Secondary | ICD-10-CM | POA: Insufficient documentation

## 2021-10-28 DIAGNOSIS — K5730 Diverticulosis of large intestine without perforation or abscess without bleeding: Secondary | ICD-10-CM | POA: Insufficient documentation

## 2021-10-28 DIAGNOSIS — R918 Other nonspecific abnormal finding of lung field: Secondary | ICD-10-CM | POA: Insufficient documentation

## 2021-10-28 DIAGNOSIS — Z87442 Personal history of urinary calculi: Secondary | ICD-10-CM | POA: Insufficient documentation

## 2021-10-28 DIAGNOSIS — Z9221 Personal history of antineoplastic chemotherapy: Secondary | ICD-10-CM | POA: Insufficient documentation

## 2021-10-28 DIAGNOSIS — J439 Emphysema, unspecified: Secondary | ICD-10-CM | POA: Diagnosis not present

## 2021-10-28 DIAGNOSIS — K7460 Unspecified cirrhosis of liver: Secondary | ICD-10-CM | POA: Diagnosis not present

## 2021-10-28 DIAGNOSIS — K219 Gastro-esophageal reflux disease without esophagitis: Secondary | ICD-10-CM | POA: Diagnosis not present

## 2021-10-28 DIAGNOSIS — Z801 Family history of malignant neoplasm of trachea, bronchus and lung: Secondary | ICD-10-CM | POA: Diagnosis not present

## 2021-10-28 NOTE — Telephone Encounter (Signed)
Pt last saw Dr [name] 03/08/21, last labs 10/22/21 Creat 0.68, age 76, weight 66.1kg, CrCl 71.15, based on CrCl pt is on appropriate dosage of Xarelto 20mg  QD for afib.  Will refill rx.  ?

## 2021-10-28 NOTE — Progress Notes (Signed)
See MD note for nursing evaluation. °

## 2021-10-30 ENCOUNTER — Ambulatory Visit (INDEPENDENT_AMBULATORY_CARE_PROVIDER_SITE_OTHER): Payer: Medicare Other

## 2021-10-30 DIAGNOSIS — I495 Sick sinus syndrome: Secondary | ICD-10-CM

## 2021-10-30 LAB — CUP PACEART REMOTE DEVICE CHECK
Battery Remaining Longevity: 86 mo
Battery Remaining Percentage: 67 %
Battery Voltage: 3.02 V
Brady Statistic RV Percent Paced: 33 %
Date Time Interrogation Session: 20230329020014
Implantable Lead Implant Date: 20190329
Implantable Lead Implant Date: 20190329
Implantable Lead Location: 753859
Implantable Lead Location: 753860
Implantable Pulse Generator Implant Date: 20190329
Lead Channel Impedance Value: 650 Ohm
Lead Channel Pacing Threshold Amplitude: 0.75 V
Lead Channel Pacing Threshold Pulse Width: 0.5 ms
Lead Channel Sensing Intrinsic Amplitude: 12 mV
Lead Channel Setting Pacing Amplitude: 2.5 V
Lead Channel Setting Pacing Pulse Width: 0.5 ms
Lead Channel Setting Sensing Sensitivity: 2 mV
Pulse Gen Model: 2272
Pulse Gen Serial Number: 9003317

## 2021-11-01 ENCOUNTER — Other Ambulatory Visit: Payer: Self-pay

## 2021-11-01 ENCOUNTER — Ambulatory Visit
Admission: RE | Admit: 2021-11-01 | Discharge: 2021-11-01 | Disposition: A | Discharge status: Home / Self Care | Payer: Medicare Other | Source: Ambulatory Visit | Attending: Radiation Oncology | Admitting: Radiation Oncology

## 2021-11-01 DIAGNOSIS — Z510 Encounter for antineoplastic radiation therapy: Secondary | ICD-10-CM | POA: Insufficient documentation

## 2021-11-01 DIAGNOSIS — C773 Secondary and unspecified malignant neoplasm of axilla and upper limb lymph nodes: Secondary | ICD-10-CM | POA: Insufficient documentation

## 2021-11-01 DIAGNOSIS — C3411 Malignant neoplasm of upper lobe, right bronchus or lung: Secondary | ICD-10-CM | POA: Diagnosis not present

## 2021-11-01 DIAGNOSIS — C3491 Malignant neoplasm of unspecified part of right bronchus or lung: Secondary | ICD-10-CM

## 2021-11-01 DIAGNOSIS — R918 Other nonspecific abnormal finding of lung field: Secondary | ICD-10-CM | POA: Diagnosis present

## 2021-11-04 ENCOUNTER — Encounter: Payer: Self-pay | Admitting: Internal Medicine

## 2021-11-04 NOTE — Progress Notes (Signed)
TO BE COMPLETED BY RADIATION ONCOLOGIST OFFICE:  ? ?Patient Name: Nancy Hodges  ? ?Date of Birth: 06/09/1946  ? ?Radiation Oncologist: Gery Pray  ? ?Site to be Treated: left and right lungs  ? ?Will x-rays >10 MV be used? yes  ? ?Will the radiation be >10 cm from the device? 13 cm is closest distance  ? ?Planned Treatment Start Date: 11/12/2021 ?TO BE COMPLETED BY CARDIOLOGIST OFFICE:  ? ?Device Information:  Pacemaker [x]      ICD []   ? ?Brand: St. Jude/[place]: [phone] Model #: 2272 Assurity MRI  ? ?Serial Number: [id number]     Date of Placement: 10/30/2017 ? ?Site of Placement: L.Chest ? ?Remote Device Check--Frequency: 91 days   Last Check: 10/30/21 ? ?Is the Patient Pacer Dependent?:  Yes []   No []  ? ?Does cardiologist request Radiation Oncology to schedule device testing by vendor for the following:  ?Prior to the Initiation of Treatments?  Yes []  No [x]  ?During Treatments?  Yes []  No [x]  ?Post Radiation Treatments?  Yes []  No [x]  ? ?Is device monitoring necessary by vendor/cardiologist team during treatments?  ?Yes []   No [x]  ? ?Is cardiac monitoring by Radiation Oncology nursing necessary during treatments? ?Yes []   No [x]  ? ?Do you recommend device be relocated prior to Radiation Treatment? ?Yes []   No [x]  ? ?**PLEASE LIST ANY NOTES OR SPECIAL REQUESTS: ? ? ? ?  ? ?CARDIOLOGIST SIGNATURE:  Dr. [name] ?Per Device Clinic Standing Orders, ?Wanda Plump  ?11/04/2021 ?3:42 PM ? ?**Please route completed form back to Radiation Oncology Nursing and "P [facility] RAD ONC ADMIN", OR send an update if there will be a delay in having form completed by expected start date.  ?**Call [phone] if you have any questions or do not get an in-basket response from a Radiation Oncology staff member  ? ? ? ? ? ? ? ? ? ? ? ?  ?

## 2021-11-08 ENCOUNTER — Encounter: Payer: Self-pay | Admitting: *Deleted

## 2021-11-08 NOTE — Progress Notes (Signed)
Oncology Nurse Navigator Documentation ? ? ?  11/08/2021  ? 12:00 PM 10/22/2021  ? 10:00 AM 09/30/2021  ? 12:00 PM 09/25/2021  ?  3:00 PM 09/24/2021  ? 10:00 AM 07/30/2021  ? 11:00 AM 11/22/2019  ?  1:00 PM  ?Oncology Nurse Navigator Flowsheets  ?Navigator Location [facility]  ?Navigator Encounter Type Appt/Treatment Plan Review Clinic/MDC Telephone Telephone Clinic/MDC Clinic/MDC Clinic/MDC  ?Telephone  Clinic/MDC Follow-up Outgoing Call;Incoming Call Outgoing Call     ?Patient Visit Type Other MedOnc Other Other MedOnc MedOnc MedOnc  ?Treatment Phase  Treatment Treatment Other Treatment Treatment Treatment  ?Barriers/Navigation Needs Coordination of Care/I followed up on Nancy Hodges's plan of care.  Patient had a change in tx plan with added radiation therapy.  Patient is set up for her radiation to start next week and follow up with Dr. Julien Nordmann in 2 weeks.   Education Theatre stage manager Education  Education  ?Education  Contractor;Other Other Other Other  Other  ?Interventions Coordination of Care Education;Psycho-Social Support Coordination of Care;Education;Psycho-Social Support Education;Coordination of Care Education;Psycho-Social Support Psycho-Social Support Education;Psycho-Social Support  ?Acuity Level 2-Minimal Needs (1-2 Barriers Identified) Level 2-Minimal Needs (1-2 Barriers Identified) Level 2-Minimal Needs (1-2 Barriers Identified) Level 2-Minimal Needs (1-2 Barriers Identified) Level 2-Minimal Needs (1-2 Barriers Identified) Level 3-Moderate Needs (3-4 Barriers Identified) Level 2-Minimal Needs (1-2 Barriers Identified)  ?Coordination of Care Other  Appts Other     ?Education Method  Verbal Verbal Verbal Verbal    ?Time Spent with Patient 30 30 30 30 30 30 15   ?  ?

## 2021-11-12 ENCOUNTER — Ambulatory Visit: Payer: Medicare Other | Admitting: Radiation Oncology

## 2021-11-12 ENCOUNTER — Telehealth: Payer: Self-pay | Admitting: Radiation Oncology

## 2021-11-12 NOTE — Progress Notes (Signed)
Remote pacemaker transmission.   

## 2021-11-12 NOTE — Telephone Encounter (Signed)
Returned call to pt in response to request for appt change. LVM stating therapist will discuss change needed at next appt 4/17 ?

## 2021-11-14 ENCOUNTER — Ambulatory Visit: Payer: Medicare Other | Admitting: Radiation Oncology

## 2021-11-14 DIAGNOSIS — C3491 Malignant neoplasm of unspecified part of right bronchus or lung: Secondary | ICD-10-CM | POA: Diagnosis present

## 2021-11-18 ENCOUNTER — Ambulatory Visit
Admission: RE | Admit: 2021-11-18 | Discharge: 2021-11-18 | Disposition: A | Discharge status: Home / Self Care | Payer: Medicare Other | Source: Ambulatory Visit | Attending: Radiation Oncology | Admitting: Radiation Oncology

## 2021-11-18 ENCOUNTER — Other Ambulatory Visit: Payer: Self-pay

## 2021-11-18 DIAGNOSIS — C3491 Malignant neoplasm of unspecified part of right bronchus or lung: Secondary | ICD-10-CM

## 2021-11-19 ENCOUNTER — Inpatient Hospital Stay: Payer: Medicare Other

## 2021-11-19 ENCOUNTER — Inpatient Hospital Stay: Payer: Medicare Other | Attending: Internal Medicine | Admitting: Internal Medicine

## 2021-11-19 ENCOUNTER — Other Ambulatory Visit: Payer: Medicare Other

## 2021-11-19 ENCOUNTER — Encounter: Payer: Self-pay | Admitting: Internal Medicine

## 2021-11-19 ENCOUNTER — Ambulatory Visit: Payer: Medicare Other | Admitting: Radiation Oncology

## 2021-11-19 VITALS — BP 135/74 | HR 69 | Temp 98.5°F | Resp 17 | Ht 62.0 in | Wt 147.0 lb

## 2021-11-19 DIAGNOSIS — C3491 Malignant neoplasm of unspecified part of right bronchus or lung: Secondary | ICD-10-CM

## 2021-11-19 DIAGNOSIS — Z79899 Other long term (current) drug therapy: Secondary | ICD-10-CM | POA: Insufficient documentation

## 2021-11-19 DIAGNOSIS — Z5112 Encounter for antineoplastic immunotherapy: Secondary | ICD-10-CM

## 2021-11-19 DIAGNOSIS — C3411 Malignant neoplasm of upper lobe, right bronchus or lung: Secondary | ICD-10-CM | POA: Insufficient documentation

## 2021-11-19 DIAGNOSIS — Z95828 Presence of other vascular implants and grafts: Secondary | ICD-10-CM

## 2021-11-19 DIAGNOSIS — C773 Secondary and unspecified malignant neoplasm of axilla and upper limb lymph nodes: Secondary | ICD-10-CM | POA: Insufficient documentation

## 2021-11-19 DIAGNOSIS — E032 Hypothyroidism due to medicaments and other exogenous substances: Secondary | ICD-10-CM

## 2021-11-19 LAB — CMP (CANCER CENTER ONLY)
ALT: 37 U/L (ref 0–44)
AST: 44 U/L — ABNORMAL HIGH (ref 15–41)
Albumin: 3.8 g/dL (ref 3.5–5.0)
Alkaline Phosphatase: 108 U/L (ref 38–126)
Anion gap: 7 (ref 5–15)
BUN: 11 mg/dL (ref 8–23)
CO2: 29 mmol/L (ref 22–32)
Calcium: 9.5 mg/dL (ref 8.9–10.3)
Chloride: 102 mmol/L (ref 98–111)
Creatinine: 0.7 mg/dL (ref 0.44–1.00)
GFR, Estimated: >60 mL/min (ref >60)
Glucose, Bld: 254 mg/dL — ABNORMAL HIGH (ref 70–99)
Potassium: 4.1 mmol/L (ref 3.5–5.1)
Sodium: 138 mmol/L (ref 135–145)
Total Bilirubin: 1.3 mg/dL — ABNORMAL HIGH (ref 0.3–1.2)
Total Protein: 7 g/dL (ref 6.5–8.1)

## 2021-11-19 LAB — TSH: TSH: 4.492 u[IU]/mL (ref 0.350–4.500)

## 2021-11-19 LAB — CBC WITH DIFFERENTIAL (CANCER CENTER ONLY)
Abs Immature Granulocytes: 0.01 10*3/uL (ref 0.00–0.07)
Basophils Absolute: 0 10*3/uL (ref 0.0–0.1)
Basophils Relative: 1 %
Eosinophils Absolute: 0.2 10*3/uL (ref 0.0–0.5)
Eosinophils Relative: 4 %
HCT: 39.6 % (ref 36.0–46.0)
Hemoglobin: 13.7 g/dL (ref 12.0–15.0)
Immature Granulocytes: 0 %
Lymphocytes Relative: 14 %
Lymphs Abs: 0.8 10*3/uL (ref 0.7–4.0)
MCH: 33.3 pg (ref 26.0–34.0)
MCHC: 34.6 g/dL (ref 30.0–36.0)
MCV: 96.1 fL (ref 80.0–100.0)
Monocytes Absolute: 0.4 10*3/uL (ref 0.1–1.0)
Monocytes Relative: 8 %
Neutro Abs: 4.3 10*3/uL (ref 1.7–7.7)
Neutrophils Relative %: 73 %
Platelet Count: 169 10*3/uL (ref 150–400)
RBC: 4.12 MIL/uL (ref 3.87–5.11)
RDW: 12.7 % (ref 11.5–15.5)
WBC Count: 5.9 10*3/uL (ref 4.0–10.5)
nRBC: 0 % (ref 0.0–0.2)

## 2021-11-19 MED ORDER — SODIUM CHLORIDE 0.9 % IV SOLN
480.0000 mg | Freq: Once | INTRAVENOUS | Status: AC
  Administered 2021-11-19: 480 mg via INTRAVENOUS
  Filled 2021-11-19: qty 48

## 2021-11-19 MED ORDER — SODIUM CHLORIDE 0.9% FLUSH
10.0000 mL | INTRAVENOUS | Status: DC | PRN
  Administered 2021-11-19: 10 mL via INTRAVENOUS

## 2021-11-19 MED ORDER — HEPARIN SOD (PORK) LOCK FLUSH 100 UNIT/ML IV SOLN
500.0000 [IU] | Freq: Once | INTRAVENOUS | Status: AC | PRN
  Administered 2021-11-19: 500 [IU]

## 2021-11-19 MED ORDER — SODIUM CHLORIDE 0.9% FLUSH
10.0000 mL | INTRAVENOUS | Status: DC | PRN
  Administered 2021-11-19: 10 mL

## 2021-11-19 MED ORDER — SODIUM CHLORIDE 0.9 % IV SOLN: Freq: Once | INTRAVENOUS | Status: AC

## 2021-11-19 NOTE — Progress Notes (Signed)
? ?    [facility] ?Telephone:(336) [phone]   [phone] ? ?OFFICE PROGRESS NOTE ? ?[name], [name] ?[address] ?[place] [address] ? ?DIAGNOSIS: Metastatic non-small cell lung cancer, adenocarcinoma. This was initially diagnosed as stage IB (T2a, N0, M0) in March of 2014, status post right upper lobectomy with lymph node dissection but the patient has evidence for disease recurrence in the right axilla status post resection. ? ?PRIOR THERAPY:  ?1) Status post right axillary lymph node biopsy on 05/01/2014 under the care of Dr. Roxan Hockey. ?2) Systemic chemotherapy with carboplatin for AUC of 5 and Alimta 500 mg/M2 every 3 weeks. First cycle 05/31/2014. Status post 6 cycles. Last dose was given 09/22/2014. ?3) palliative radiotherapy to the subcarinal lymph node under the care of Dr. Sondra Come. ?4) Nivolumab 240 mg IV every 2 weeks. First dose 12/23/2016. Status post 6 cycles. ?5) SBRT to the enlarging pulmonary nodules under the care of Dr. Sondra Come. ? ?CURRENT THERAPY:  Nivolumab 480 mg IV every 4 weeks. First dose 03/17/2017. Status post 59 cycles. ? ?INTERVAL HISTORY: ?Nancy Hodges 76 y.o. female returns to the clinic today for follow-up visit.  The patient is feeling fine today with no concerning complaints except for mild cough that increased after her radiotherapy.  She denied having any current chest pain, shortness of breath or hemoptysis.  She denied having any fever or chills.  She has no nausea, vomiting, diarrhea or constipation.  She has no headache or visual changes.  She denied having any recent weight loss or night sweats.  She continues to tolerate her treatment with nivolumab fairly well.  She is here today for evaluation before starting cycle #60. ? ? ?MEDICAL HISTORY: ?Past Medical History:  ?Diagnosis Date  ? Allergic rhinitis   ? Anxiety   ? Arthritis   ? Asthma   ? Atrial flutter ([place])   ? Cholelithiases 09/26/2015  ? Diverticulosis   ? Encounter for  antineoplastic immunotherapy 12/09/2016  ? GERD (gastroesophageal reflux disease)   ? H/O hiatal hernia   ? History of blood transfusion   ? History of chemotherapy   ? History of kidney stones   ? History of radiation therapy   ? Internal hemorrhoid   ? Lung cancer ([place])   ? a. Stage IB non-small cell carcinoma, s/p R VATS, wedge resection, RU lobectomy 10/2012.  ? Persistent atrial fibrillation ([place])   ? a. anticoagulated with Xarelto  ? Pneumonia 06/2016  ? [facility]  ? Presence of permanent cardiac pacemaker   ? Pulmonary nodule   ? Tachycardia-bradycardia syndrome ([place])   ? a. 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  ? ? ?ALLERGIES:  is allergic to chlorpheniramine-dm, prednisone, rofecoxib, septra [sulfamethoxazole-trimethoprim], fluticasone, molds & smuts, and penicillins. ? ?MEDICATIONS:  ?Current Outpatient Medications  ?Medication Sig Dispense Refill  ? acetaminophen (TYLENOL) 500 MG tablet Take 500 mg by mouth 3 (three) times daily as needed for moderate pain or fever.     ? albuterol (PROVENTIL HFA;VENTOLIN HFA) 108 (90 BASE) MCG/ACT inhaler Inhale 2 puffs into the lungs every 6 (six) hours as needed for wheezing or shortness of breath.     ? azelastine (ASTELIN) 0.1 % nasal spray Place 1 spray into both nostrils 2 (two) times daily. Use in each nostril as directed    ? benzonatate (TESSALON) 200 MG capsule Take 1 capsule (200 mg total) by mouth 3 (three) times daily as needed for cough. (Patient not taking: Reported on  10/28/2021) 20 capsule 0  ? calcium carbonate (TUMS - DOSED IN MG ELEMENTAL CALCIUM) 500 MG chewable tablet Chew 1 tablet by mouth 2 (two) times daily as needed for indigestion or heartburn.     ? diltiazem (CARTIA XT) 240 MG 24 hr capsule Take 1 capsule (240 mg total) by mouth daily. 90 capsule 3  ? dimenhyDRINATE (DRAMAMINE) 50 MG tablet Take 12.5 mg by mouth every 8 (eight) hours as needed for dizziness.    ? hydrocortisone 1 % lotion Apply 1 application topically 2 (two) times  daily. 118 mL 0  ? ipratropium-albuterol (DUONEB) 0.5-2.5 (3) MG/3ML SOLN Take 3 mLs by nebulization every 6 (six) hours as needed (shortness of breath or wheezing).     ? levothyroxine (SYNTHROID) 50 MCG tablet TAKE 1 TABLET(50 MCG) BY MOUTH DAILY BEFORE AND BREAKFAST (Patient not taking: Reported on 10/28/2021) 90 tablet 0  ? lidocaine-prilocaine (EMLA) cream APPLY TOPICALLY TO TO THE AFFECTED AREA AS NEEDED 30 g 4  ? loratadine (CLARITIN) 10 MG tablet Take 10 mg by mouth daily.    ? meclizine (ANTIVERT) 12.5 MG tablet     ? OVER THE COUNTER MEDICATION Take 5 mLs by mouth daily as needed (cough). H[employer]'s kids cough syrup    ? Polyvinyl Alcohol-Povidone (REFRESH OP) Place 1 drop into both eyes daily as needed (dry eyes).    ? XARELTO 20 MG TABS tablet TAKE 1 TABLET(20 MG) BY MOUTH DAILY WITH SUPPER 30 tablet 5  ? ?No current facility-administered medications for this visit.  ? ?Facility-Administered Medications Ordered in Other Visits  ?Medication Dose Route Frequency Provider Last Rate Last Admin  ? sodium chloride flush (NS) 0.9 % injection 10 mL  10 mL Intravenous PRN Curt Bears, MD   10 mL at 11/19/21 0851  ? ? ?SURGICAL HISTORY:  ?Past Surgical History:  ?Procedure Laterality Date  ? CARDIOVERSION N/A 02/19/2018  ? Procedure: CARDIOVERSION;  Surgeon: Sanda Klein, MD;  Location: [place];  Service: Cardiovascular;  Laterality: N/A;  ? CARDIOVERSION N/A 04/30/2018  ? Procedure: CARDIOVERSION;  Surgeon: Thayer Headings, MD;  Location: [place];  Service: Cardiovascular;  Laterality: N/A;  ? CATARACT EXTRACTION W/PHACO Right 08/14/2016  ? Procedure: CATARACT EXTRACTION PHACO AND INTRAOCULAR LENS PLACEMENT RIGHT EYE CDE 10.53;  Surgeon: Tonny Branch, MD;  [facility];  Service: Ophthalmology;  Laterality: Right;  right  ? CATARACT EXTRACTION W/PHACO Left 09/25/2016  ? Procedure: CATARACT EXTRACTION PHACO AND INTRAOCULAR LENS PLACEMENT (IOC);  Surgeon: Tonny Branch, MD;  [facility];  Service:  Ophthalmology;  Laterality: Left;  left ?CDE 8.81  ? COLONOSCOPY    ? [facility]: cattered sigmoid diverticula, internal hemorrhoids, sessile polyp in ascending and mid transverse colon (tubular adenoma).   ? colonoscopy with polypectomy  2015  ? ESOPHAGOGASTRODUODENOSCOPY    ? [facility]: bile reflux in stomach, mild gastritis, hiatal hernia  ? ESOPHAGOGASTRODUODENOSCOPY N/A 01/31/2016  ? Procedure: ESOPHAGOGASTRODUODENOSCOPY (EGD);  Surgeon: [name], MD;  [facility];  Service: Endoscopy;  Laterality: N/A;  1200  ? IR FLUORO GUIDE PORT INSERTION RIGHT  06/30/2017  ? IR US GUIDE VASC ACCESS RIGHT  06/30/2017  ? LOBECTOMY Right 10/27/2012  ? Procedure: LOBECTOMY;  Surgeon: [name], MD;  Location: [place];  Service: Thoracic;  Laterality: Right;   RIGHT UPPER LOBECTOMY & Node Dissection  ? LYMPH NODE BIOPSY Right 05/01/2014  ? Procedure: LYMPH NODE BIOPSY, Right Axillary;  Surgeon: [name], MD;  Location: [place];  Service:  Thoracic;  Laterality: Right;  ? PACEMAKER IMPLANT N/A 10/30/2017  ? SJM Assurity MRI PPM implanted by Dr [name] once leadless pacemaker battery depleted  ? PARTIAL HYSTERECTOMY    ? PERMANENT PACEMAKER INSERTION N/A 09/14/2012  ? Nanostim (SJM) leadless pacemaker (LEADLESS II STUDY PATIENT) implanted by Dr [name], no longer functioning, device abandoned.  ? VIDEO ASSISTED THORACOSCOPY (VATS)/WEDGE RESECTION Right 10/27/2012  ? Procedure: VIDEO ASSISTED THORACOSCOPY (VATS),RGHT UPPER LOBE LUNG WEDGE RESECTION;  Surgeon: [name], MD;  Location: [place];  Service: Thoracic;  Laterality: Right;  ? ? ?REVIEW OF SYSTEMS:  A comprehensive review of systems was negative except for: Constitutional: positive for fatigue ?Respiratory: positive for cough  ? ?PHYSICAL EXAMINATION: General appearance: alert, cooperative, fatigued, and no distress ?Head: Normocephalic, without obvious abnormality, atraumatic ?Neck: no adenopathy, no JVD, supple, symmetrical, trachea  midline, and thyroid not enlarged, symmetric, no tenderness/mass/nodules ?Lymph nodes: Cervical, supraclavicular, and axillary nodes normal. ?Resp: clear to auscultation bilaterally ?Back: symmetric, no curva

## 2021-11-19 NOTE — Patient Instructions (Signed)
[facility] MEDICAL [facility]COLOG[facility]  Discharge Instructions: ?Thank you for choosing [facility] to provide your oncology and hematology care.  ? ?If you have a lab appointment with the Cancer Center, please go directly to the Cancer Center and check in at the registration area. ?  ?Wear comfortable clothing and clothing appropriate for easy access to any Portacath or PICC line.  ? ?We strive to give you quality time with your provider. You may need to reschedule your appointment if you arrive late (15 or more minutes).  Arriving late affects you and other patients whose appointments are after yours.  Also, if you miss three or more appointments without notifying the office, you may be dismissed from the clinic at the provider?s discretion.    ?  ?For prescription refill requests, have your pharmacy contact our office and allow 72 hours for refills to be completed.   ? ?Today you received the following chemotherapy and/or immunotherapy agents Opdivo    ?  ?To help prevent nausea and vomiting after your treatment, we encourage you to take your nausea medication as directed. ? ?BELOW ARE SYMPTOMS THAT SHOULD BE REPORTED IMMEDIATELY: ?*FEVER GREATER THAN 100.4 F (38 ?C) OR HIGHER ?*CHILLS OR SWEATING ?*NAUSEA AND VOMITING THAT IS NOT CONTROLLED WITH YOUR NAUSEA MEDICATION ?*UNUSUAL SHORTNESS OF BREATH ?*UNUSUAL BRUISING OR BLEEDING ?*URINARY PROBLEMS (pain or burning when urinating, or frequent urination) ?*BOWEL PROBLEMS (unusual diarrhea, constipation, pain near the anus) ?TENDERNESS IN MOUTH AND THROAT WITH OR WITHOUT PRESENCE OF ULCERS (sore throat, sores in mouth, or a toothache) ?UNUSUAL RASH, SWELLING OR PAIN  ?UNUSUAL VAGINAL DISCHARGE OR ITCHING  ? ?Items with * indicate a potential emergency and should be followed up as soon as possible or go to the Emergency Department if any problems should occur. ? ?Please show the CHEMOTHERAPY ALERT CARD or IMMUNOTHERAPY ALERT CARD at check-in to the  Emergency Department and triage nurse. ? ?Should you have questions after your visit or need to cancel or reschedule your appointment, please contact [facility]  Dept: [phone]  and follow the prompts.  Office hours are 8:00 a.m. to 4:30 p.m. Monday - Friday. Please note that voicemails left after 4:00 p.m. may not be returned until the following business day.  We are closed weekends and major holidays. You have access to a nurse at all times for urgent questions. Please call the main number to the clinic Dept: [phone] and follow the prompts. ? ? ?For any non-urgent questions, you may also contact your provider using MyChart. We now offer e-Visits for anyone 18 and older to request care online for non-urgent symptoms. For details visit [email or web address]. ?  ?Also download the MyChart app! Go to the app store, search "MyChart", open the app, select [facility], and log in with your MyChart username and password. ? ?Due to Covid, a mask is required upon entering the hospital/clinic. If you do not have a mask, one will be given to you upon arrival. For doctor visits, patients may have 1 support person aged 18 or older with them. For treatment visits, patients cannot have anyone with them due to current Covid guidelines and our immunocompromised population.  ? ?

## 2021-11-20 ENCOUNTER — Other Ambulatory Visit: Payer: Self-pay

## 2021-11-20 ENCOUNTER — Ambulatory Visit
Admission: RE | Admit: 2021-11-20 | Discharge: 2021-11-20 | Disposition: A | Discharge status: Home / Self Care | Payer: Medicare Other | Source: Ambulatory Visit | Attending: Radiation Oncology | Admitting: Radiation Oncology

## 2021-11-20 DIAGNOSIS — C3491 Malignant neoplasm of unspecified part of right bronchus or lung: Secondary | ICD-10-CM

## 2021-11-20 LAB — RAD ONC ARIA SESSION SUMMARY
Course Elapsed Days: 2
Plan Fractions Treated to Date: 2
Plan Fractions Treated to Date: 2
Plan Prescribed Dose Per Fraction: 10 Gy
Plan Prescribed Dose Per Fraction: 10 Gy
Plan Total Fractions Prescribed: 5
Plan Total Fractions Prescribed: 5
Plan Total Prescribed Dose: 50 Gy
Plan Total Prescribed Dose: 50 Gy
Reference Point Dosage Given to Date: 20 Gy
Reference Point Dosage Given to Date: 20 Gy
Reference Point Session Dosage Given: 10 Gy
Reference Point Session Dosage Given: 10 Gy
Session Number: 2

## 2021-11-22 ENCOUNTER — Other Ambulatory Visit: Payer: Self-pay

## 2021-11-22 ENCOUNTER — Telehealth: Payer: Self-pay | Admitting: Internal Medicine

## 2021-11-22 ENCOUNTER — Ambulatory Visit
Admission: RE | Admit: 2021-11-22 | Discharge: 2021-11-22 | Disposition: A | Discharge status: Home / Self Care | Payer: Medicare Other | Source: Ambulatory Visit | Attending: Radiation Oncology | Admitting: Radiation Oncology

## 2021-11-22 DIAGNOSIS — C3491 Malignant neoplasm of unspecified part of right bronchus or lung: Secondary | ICD-10-CM | POA: Diagnosis not present

## 2021-11-22 LAB — RAD ONC ARIA SESSION SUMMARY
Course Elapsed Days: 4
Plan Fractions Treated to Date: 3
Plan Fractions Treated to Date: 3
Plan Prescribed Dose Per Fraction: 10 Gy
Plan Prescribed Dose Per Fraction: 10 Gy
Plan Total Fractions Prescribed: 5
Plan Total Fractions Prescribed: 5
Plan Total Prescribed Dose: 50 Gy
Plan Total Prescribed Dose: 50 Gy
Reference Point Dosage Given to Date: 30 Gy
Reference Point Dosage Given to Date: 30 Gy
Reference Point Session Dosage Given: 10 Gy
Reference Point Session Dosage Given: 10 Gy
Session Number: 3

## 2021-11-22 NOTE — Telephone Encounter (Signed)
Scheduled per 04/18 los, patient has been called and notified. ?

## 2021-11-26 ENCOUNTER — Ambulatory Visit
Admission: RE | Admit: 2021-11-26 | Discharge: 2021-11-26 | Disposition: A | Discharge status: Home / Self Care | Payer: Medicare Other | Source: Ambulatory Visit | Attending: Radiation Oncology | Admitting: Radiation Oncology

## 2021-11-26 ENCOUNTER — Other Ambulatory Visit: Payer: Self-pay

## 2021-11-26 DIAGNOSIS — C3491 Malignant neoplasm of unspecified part of right bronchus or lung: Secondary | ICD-10-CM | POA: Diagnosis not present

## 2021-11-26 LAB — RAD ONC ARIA SESSION SUMMARY
Course Elapsed Days: 8
Plan Fractions Treated to Date: 4
Plan Fractions Treated to Date: 4
Plan Prescribed Dose Per Fraction: 10 Gy
Plan Prescribed Dose Per Fraction: 10 Gy
Plan Total Fractions Prescribed: 5
Plan Total Fractions Prescribed: 5
Plan Total Prescribed Dose: 50 Gy
Plan Total Prescribed Dose: 50 Gy
Reference Point Dosage Given to Date: 40 Gy
Reference Point Dosage Given to Date: 40 Gy
Reference Point Session Dosage Given: 10 Gy
Reference Point Session Dosage Given: 10 Gy
Session Number: 4

## 2021-11-28 ENCOUNTER — Encounter: Payer: Self-pay | Admitting: Radiation Oncology

## 2021-11-28 ENCOUNTER — Ambulatory Visit
Admission: RE | Admit: 2021-11-28 | Discharge: 2021-11-28 | Disposition: A | Discharge status: Home / Self Care | Payer: Medicare Other | Source: Ambulatory Visit | Attending: Radiation Oncology | Admitting: Radiation Oncology

## 2021-11-28 ENCOUNTER — Other Ambulatory Visit: Payer: Self-pay

## 2021-11-28 DIAGNOSIS — C3491 Malignant neoplasm of unspecified part of right bronchus or lung: Secondary | ICD-10-CM | POA: Diagnosis not present

## 2021-11-28 LAB — RAD ONC ARIA SESSION SUMMARY
Course Elapsed Days: 10
Plan Fractions Treated to Date: 5
Plan Fractions Treated to Date: 5
Plan Prescribed Dose Per Fraction: 10 Gy
Plan Prescribed Dose Per Fraction: 10 Gy
Plan Total Fractions Prescribed: 5
Plan Total Fractions Prescribed: 5
Plan Total Prescribed Dose: 50 Gy
Plan Total Prescribed Dose: 50 Gy
Reference Point Dosage Given to Date: 50 Gy
Reference Point Dosage Given to Date: 50 Gy
Reference Point Session Dosage Given: 10 Gy
Reference Point Session Dosage Given: 10 Gy
Session Number: 5

## 2021-11-29 ENCOUNTER — Telehealth: Payer: Self-pay | Admitting: Internal Medicine

## 2021-11-29 ENCOUNTER — Encounter: Payer: Self-pay | Admitting: *Deleted

## 2021-11-29 NOTE — Progress Notes (Signed)
Oncology Nurse Navigator Documentation ? ? ?  11/29/2021  ?  9:00 AM 11/08/2021  ? 12:00 PM 10/22/2021  ? 10:00 AM 09/30/2021  ? 12:00 PM 09/25/2021  ?  3:00 PM 09/24/2021  ? 10:00 AM 07/30/2021  ? 11:00 AM  ?Oncology Nurse Navigator Flowsheets  ?Navigator Location [facility]  ?Navigator Encounter Type Telephone Appt/Treatment Plan Review Clinic/MDC Telephone Telephone Clinic/MDC Clinic/MDC  ?Telephone Outgoing Call  Clinic/MDC Follow-up Outgoing Call;Incoming Call Outgoing Call    ?Patient Visit Type  Other MedOnc Other Other MedOnc MedOnc  ?Treatment Phase   Treatment Treatment Other Treatment Treatment  ?Barriers/Navigation Needs Education;Coordination of Care Coordination of Care Education Coordination of Care;Education Coordination of Care;Education Education   ?Education Other  Actor Options;Other Other Other Other   ?Interventions Education;Coordination of Care/I received a call from Nancy Hodges. She wanted to change her appt. When I updated her on the appt she was ok with the appt schedule. I listened as she explained.  I asked if she needed anything else to call back.  Coordination of Care Education;Psycho-Social Support Coordination of Care;Education;Psycho-Social Support Education;Coordination of Care Education;Psycho-Social Support Psycho-Social Support  ?Acuity Level 2-Minimal Needs (1-2 Barriers Identified) Level 2-Minimal Needs (1-2 Barriers Identified) Level 2-Minimal Needs (1-2 Barriers Identified) Level 2-Minimal Needs (1-2 Barriers Identified) Level 2-Minimal Needs (1-2 Barriers Identified) Level 2-Minimal Needs (1-2 Barriers Identified) Level 3-Moderate Needs (3-4 Barriers Identified)  ?Coordination of Care Other Other  Appts Other    ?Education Method Verbal  Verbal Verbal Verbal Verbal   ?Time Spent with Patient 30 30 30 30 30 30 30   ?  ?

## 2021-11-29 NOTE — Telephone Encounter (Signed)
Called patient regarding upcoming May appointment, left a voicemail. ?

## 2021-12-11 ENCOUNTER — Telehealth: Payer: Self-pay | Admitting: *Deleted

## 2021-12-11 NOTE — Telephone Encounter (Signed)
CALLED PATIENT TO ALTER FU APPT. ON 01-02-22 DUE TO DR. KINARD BEING ON VACATION, SPOKE WITH PATIENT AND SHE AGREED TO COME ON 01-06-22 @ 11:30 AM ?

## 2021-12-17 ENCOUNTER — Other Ambulatory Visit: Payer: Self-pay

## 2021-12-17 ENCOUNTER — Inpatient Hospital Stay (HOSPITAL_BASED_OUTPATIENT_CLINIC_OR_DEPARTMENT_OTHER): Payer: Medicare Other | Admitting: Internal Medicine

## 2021-12-17 ENCOUNTER — Other Ambulatory Visit: Payer: Medicare Other

## 2021-12-17 ENCOUNTER — Inpatient Hospital Stay: Payer: Medicare Other | Attending: Internal Medicine

## 2021-12-17 ENCOUNTER — Ambulatory Visit: Payer: Medicare Other

## 2021-12-17 ENCOUNTER — Inpatient Hospital Stay: Payer: Medicare Other

## 2021-12-17 ENCOUNTER — Ambulatory Visit: Payer: Medicare Other | Admitting: Physician Assistant

## 2021-12-17 VITALS — BP 128/77 | HR 74 | Temp 98.0°F | Resp 18 | Wt 145.6 lb

## 2021-12-17 DIAGNOSIS — C3491 Malignant neoplasm of unspecified part of right bronchus or lung: Secondary | ICD-10-CM

## 2021-12-17 DIAGNOSIS — Z5112 Encounter for antineoplastic immunotherapy: Secondary | ICD-10-CM | POA: Diagnosis present

## 2021-12-17 DIAGNOSIS — C3411 Malignant neoplasm of upper lobe, right bronchus or lung: Secondary | ICD-10-CM | POA: Diagnosis present

## 2021-12-17 DIAGNOSIS — C773 Secondary and unspecified malignant neoplasm of axilla and upper limb lymph nodes: Secondary | ICD-10-CM | POA: Diagnosis present

## 2021-12-17 DIAGNOSIS — Z79899 Other long term (current) drug therapy: Secondary | ICD-10-CM | POA: Insufficient documentation

## 2021-12-17 DIAGNOSIS — E032 Hypothyroidism due to medicaments and other exogenous substances: Secondary | ICD-10-CM

## 2021-12-17 LAB — CBC WITH DIFFERENTIAL (CANCER CENTER ONLY)
Abs Immature Granulocytes: 0.01 10*3/uL (ref 0.00–0.07)
Basophils Absolute: 0 10*3/uL (ref 0.0–0.1)
Basophils Relative: 1 %
Eosinophils Absolute: 0.1 10*3/uL (ref 0.0–0.5)
Eosinophils Relative: 3 %
HCT: 38.5 % (ref 36.0–46.0)
Hemoglobin: 13.3 g/dL (ref 12.0–15.0)
Immature Granulocytes: 0 %
Lymphocytes Relative: 10 %
Lymphs Abs: 0.4 10*3/uL — ABNORMAL LOW (ref 0.7–4.0)
MCH: 33.7 pg (ref 26.0–34.0)
MCHC: 34.5 g/dL (ref 30.0–36.0)
MCV: 97.5 fL (ref 80.0–100.0)
Monocytes Absolute: 0.5 10*3/uL (ref 0.1–1.0)
Monocytes Relative: 11 %
Neutro Abs: 3.4 10*3/uL (ref 1.7–7.7)
Neutrophils Relative %: 75 %
Platelet Count: 124 10*3/uL — ABNORMAL LOW (ref 150–400)
RBC: 3.95 MIL/uL (ref 3.87–5.11)
RDW: 12.8 % (ref 11.5–15.5)
WBC Count: 4.5 10*3/uL (ref 4.0–10.5)
nRBC: 0 % (ref 0.0–0.2)

## 2021-12-17 LAB — CMP (CANCER CENTER ONLY)
ALT: 24 U/L (ref 0–44)
AST: 37 U/L (ref 15–41)
Albumin: 3.7 g/dL (ref 3.5–5.0)
Alkaline Phosphatase: 105 U/L (ref 38–126)
Anion gap: 4 — ABNORMAL LOW (ref 5–15)
BUN: 6 mg/dL — ABNORMAL LOW (ref 8–23)
CO2: 30 mmol/L (ref 22–32)
Calcium: 9.3 mg/dL (ref 8.9–10.3)
Chloride: 104 mmol/L (ref 98–111)
Creatinine: 0.66 mg/dL (ref 0.44–1.00)
GFR, Estimated: >60 mL/min (ref >60)
Glucose, Bld: 232 mg/dL — ABNORMAL HIGH (ref 70–99)
Potassium: 4.4 mmol/L (ref 3.5–5.1)
Sodium: 138 mmol/L (ref 135–145)
Total Bilirubin: 1.2 mg/dL (ref 0.3–1.2)
Total Protein: 6.7 g/dL (ref 6.5–8.1)

## 2021-12-17 LAB — TSH: TSH: 3.835 u[IU]/mL (ref 0.350–4.500)

## 2021-12-17 MED ORDER — SODIUM CHLORIDE 0.9 % IV SOLN: Freq: Once | INTRAVENOUS | Status: AC

## 2021-12-17 MED ORDER — SODIUM CHLORIDE 0.9 % IV SOLN
480.0000 mg | Freq: Once | INTRAVENOUS | Status: AC
  Administered 2021-12-17: 480 mg via INTRAVENOUS
  Filled 2021-12-17: qty 48

## 2021-12-17 MED ORDER — HEPARIN SOD (PORK) LOCK FLUSH 100 UNIT/ML IV SOLN
500.0000 [IU] | Freq: Once | INTRAVENOUS | Status: AC | PRN
  Administered 2021-12-17: 500 [IU]

## 2021-12-17 MED ORDER — SODIUM CHLORIDE 0.9% FLUSH
10.0000 mL | INTRAVENOUS | Status: DC | PRN
  Administered 2021-12-17: 10 mL

## 2021-12-17 NOTE — Patient Instructions (Signed)
[employer], Adult ?A central line is a long, thin tube (catheter) that is put into a vein so that it goes to a large vein above your heart. It can be used to: ?Give you medicine or fluids. ?Give you food and nutrients. ?Take blood or give you blood for testing or treatments. ?Types of central lines ?There are four main types of central lines: ?Peripherally inserted central catheter (PICC) line. This type is usually put in the upper arm and goes up the arm to the heart. ?Tunneled central line. This type is placed in a large vein in the neck, chest, or groin. It is tunneled under the skin and brought out through a second incision. ?Non-tunneled central line. This type is used for a shorter time than other types, usually for 7 days at the most. It is inserted in the neck, chest, or groin. ?Implanted port. This type can stay in place longer than other types of central lines. It is normally put in the upper chest but can also be placed in the upper arm or the belly. Surgery is needed to put it in and take it out. ?The type of central line you get will depend on how long you need it and your medical condition. ?Tell a doctor about: ?Any allergies you have. ?All medicines you are taking. These include vitamins, herbs, eye drops, creams, and over-the-counter medicines. ?Any problems you or family members have had with anesthetic medicines. ?Any blood disorders you have. ?Any surgeries you have had. ?Any medical conditions you have. ?Whether you are pregnant or may be pregnant. ?What are the risks? ?Generally, central lines are safe. However, problems may occur, including: ?Infection. ?A blood clot. ?Bleeding from the place where the central line was inserted. ?Getting a hole or crack in the central line. If this happens, the central line will need to be replaced. ?Central line failure. ?The catheter moving or coming out of place. ?What happens before the procedure? ?Medicines ?Ask your doctor about changing or  stopping: ?Your normal medicines. ?Vitamins, herbs, and supplements. ?Over-the-counter medicines. ?Do not take aspirin or ibuprofen unless you are told to. ?General instructions ?Follow instructions from your doctor about eating or drinking. ?For your safety, your doctor may: ?[place] the area of the procedure. ?Remove hair at the procedure site. ?Ask you to wash with a soap that kills germs. ?Plan to have a responsible adult take you home from the hospital or clinic. ?If you will be going home right after the procedure, plan to have a responsible adult care for you for the time you are told. This is important. ?What happens during the procedure? ?An IV tube will be put into one of your veins. ?You may be given: ?A sedative. This medicine helps you relax. ?Anesthetics. These medicines numb certain areas of your body. ?Your skin will be cleaned with a germ-killing (antiseptic) solution. You may be covered with clean drapes. ?Your blood pressure, heart rate, breathing rate, and blood oxygen level will be monitored during the procedure. ?The central line will be put into the vein and moved through it to the correct spot. The doctor may use X-ray equipment to help guide the central line to the right place. ?A bandage (dressing) will be placed over the insertion area. ?The procedure may vary among doctors and hospitals. ?What can I expect after the procedure? ?You will be monitored until you leave the hospital or clinic. This includes checking your blood pressure, heart rate, breathing rate, and blood oxygen level. ?Caps may  be placed on the ends of the central line tubing. ?If you were given a sedative during your procedure, do not drive or use machines until your doctor says that it is safe. ?Follow these instructions at home: ?Caring for the tube ? ?Follow instructions from your doctor about: ?Flushing the tube. ?Cleaning the tube and the area around it. ?Only use germ-free (sterile) supplies to flush. The supplies  should be from your doctor, a pharmacy, or another place that your doctor recommends. ?Before you flush the tube or clean the area around the tube: ?Wash your hands with soap and water for at least 20 seconds. If you cannot use soap and water, use hand sanitizer. ?Clean the central line hub with rubbing alcohol. To do this: ?Scrub it using a twisting motion and rub for 10 to 15 seconds or for 30 twists. Follow the manufacturer's instructions. ?Be sure you scrub the top of the hub, not just the sides. Never reuse alcohol pads. ?Let the hub dry before use. Keep it from touching anything while drying. ?Caring for your skin ?Check the skin around the central line every day for signs of infection. Check for: ?Redness, swelling, or pain. ?Fluid or blood. ?Warmth. ?Pus or a bad smell. ?Keep the area where the tube was put in clean and dry. ?Change bandages only as told by your doctor. ?Keep your bandage dry. If a bandage gets wet, have it changed right away. ?General instructions ?Keep the tube clamped, unless it is being used. ?If you or someone else accidentally pulls on the tube, make sure: ?The bandage is okay. ?There is no bleeding. ?The tube has not been pulled out. ?Do not use scissors or sharp objects near the tube. ?Do not take baths, swim, or use a hot tub until your doctor says it is okay. Ask your doctor if you may take showers. You may only be allowed to take sponge baths. ?Ask your doctor what activities are safe for you. Your doctor may tell you not to lift anything or move your arm too much. ?Take over-the-counter and prescription medicines only as told by your doctor. ?Keep all follow-up visits. ?Storing and throwing away supplies ?Keep your supplies in a clean, dry location. ?Throw away any used syringes in a container that is only for sharp items (sharps container). You can buy a sharps container from a pharmacy, or you can make one by using an empty hard plastic bottle with a cover. ?Place any used  bandages or infusion bags into a plastic bag. Throw that bag in the trash. ?Contact a doctor if: ?You have any of these signs of infection where the tube was put in: ?Redness, swelling, or pain. ?Fluid or blood. ?Warmth. ?Pus or a bad smell. ?Get help right away if: ?You have: ?A fever or chills. ?Shortness of breath. ?Pain in your chest. ?A fast heartbeat. ?Swelling in your neck, face, chest, or arm. ?You feel dizzy or you faint. ?There are red lines coming from where the tube was put in. ?The area where the tube was put in is bleeding and the bleeding will not stop. ?Your tube is hard to flush. ?You do not get a blood return from the tube. ?The tube gets loose or comes out. ?The tube has a hole or a tear. ?The tube leaks. ?Summary ?A central line is a long, thin tube (catheter) that is put in your vein. It can be used to give you medicine, food, or fluids. ?Follow instructions from your doctor about flushing  and cleaning the tube. ?Keep the area where the tube was put in clean and dry. ?Ask your doctor what activities are safe for you. ?This information is not intended to replace advice given to you by your health care provider. Make sure you discuss any questions you have with your health care provider. ?Document Revised: 03/22/2020 Document Reviewed: 03/22/2020 ?Elsevier Patient Education ? [place]. ? ?

## 2021-12-17 NOTE — Patient Instructions (Signed)
[facility]   ?Discharge Instructions: ?Thank you for choosing [facility] to provide your oncology and hematology care.  ? ?If you have a lab appointment with the [facility], please go directly to the [facility] and check in at the registration area. ?  ?Wear comfortable clothing and clothing appropriate for easy access to any Portacath or PICC line.  ? ?We strive to give you quality time with your provider. You may need to reschedule your appointment if you arrive late (15 or more minutes).  Arriving late affects you and other patients whose appointments are after yours.  Also, if you miss three or more appointments without notifying the office, you may be dismissed from the clinic at the provider?s discretion.    ?  ?For prescription refill requests, have your pharmacy contact our office and allow 72 hours for refills to be completed.   ? ?Today you received the following chemotherapy and/or immunotherapy agents: Nivolumab (Opdivo)    ?  ?To help prevent nausea and vomiting after your treatment, we encourage you to take your nausea medication as directed. ? ?BELOW ARE SYMPTOMS THAT SHOULD BE REPORTED IMMEDIATELY: ?*FEVER GREATER THAN 100.4 F (38 ?C) OR HIGHER ?*CHILLS OR SWEATING ?*NAUSEA AND VOMITING THAT IS NOT CONTROLLED WITH YOUR NAUSEA MEDICATION ?*UNUSUAL SHORTNESS OF BREATH ?*UNUSUAL BRUISING OR BLEEDING ?*URINARY PROBLEMS (pain or burning when urinating, or frequent urination) ?*BOWEL PROBLEMS (unusual diarrhea, constipation, pain near the anus) ?TENDERNESS IN MOUTH AND THROAT WITH OR WITHOUT PRESENCE OF ULCERS (sore throat, sores in mouth, or a toothache) ?UNUSUAL RASH, SWELLING OR PAIN  ?UNUSUAL VAGINAL DISCHARGE OR ITCHING  ? ?Items with * indicate a potential emergency and should be followed up as soon as possible or go to the Emergency Department if any problems should occur. ? ?Please show the CHEMOTHERAPY ALERT CARD or IMMUNOTHERAPY ALERT CARD at  check-in to the Emergency Department and triage nurse. ? ?Should you have questions after your visit or need to cancel or reschedule your appointment, please contact [facility]  Dept: [phone]  and follow the prompts.  Office hours are 8:00 a.m. to 4:30 p.m. Monday - Friday. Please note that voicemails left after 4:00 p.m. may not be returned until the following business day.  We are closed weekends and major holidays. You have access to a nurse at all times for urgent questions. Please call the main number to the clinic Dept: [phone] and follow the prompts. ? ? ?For any non-urgent questions, you may also contact your provider using MyChart. We now offer e-Visits for anyone 60 and older to request care online for non-urgent symptoms. For details visit [email or web address]. ?  ?Also download the MyChart app! Go to the app store, search "MyChart", open the app, select [facility], and log in with your MyChart username and password. ? ?Due to Covid, a mask is required upon entering the hospital/clinic. If you do not have a mask, one will be given to you upon arrival. For doctor visits, patients may have 1 support person aged 76 or older with them. For treatment visits, patients cannot have anyone with them due to current Covid guidelines and our immunocompromised population.  ? ?

## 2021-12-17 NOTE — Progress Notes (Signed)
? ?    [facility] ?[phone]   [phone] ? ?OFFICE PROGRESS NOTE ? ?[name], [name] ?[address] ?[place] [address] ? ?DIAGNOSIS: Metastatic non-small cell lung cancer, adenocarcinoma. This was initially diagnosed as stage IB (T2a, N0, M0) in March of 2014, status post right upper lobectomy with lymph node dissection but the patient has evidence for disease recurrence in the right axilla status post resection. ? ?PRIOR THERAPY:  ?1) Status post right axillary lymph node biopsy on 05/01/2014 under the care of Dr. Roxan Hockey. ?2) Systemic chemotherapy with carboplatin for AUC of 5 and Alimta 500 mg/M2 every 3 weeks. First cycle 05/31/2014. Status post 6 cycles. Last dose was given 09/22/2014. ?3) palliative radiotherapy to the subcarinal lymph node under the care of Dr. Sondra Come. ?4) Nivolumab 240 mg IV every 2 weeks. First dose 12/23/2016. Status post 6 cycles. ?5) SBRT to the enlarging pulmonary nodules under the care of Dr. Sondra Come. ? ?CURRENT THERAPY:  Nivolumab 480 mg IV every 4 weeks. First dose 03/17/2017. Status post 60 cycles. ? ?INTERVAL HISTORY: ?Nancy Hodges 76 y.o. female returns to the clinic today for follow-up visit.  The patient is feeling fine today with no concerning complaints.  She tolerated her previous radiotherapy fairly well.  She tolerated the last dose of her treatment with nivolumab with no significant adverse effects.  She has no chest pain, shortness of breath, cough or hemoptysis.  She has no nausea, vomiting, diarrhea or constipation.  She has no headache or visual changes.  She denied having any significant weight loss or night sweats.  She is here today for evaluation before starting cycle #61 of her treatment. ? ? ?MEDICAL HISTORY: ?Past Medical History:  ?Diagnosis Date  ? Allergic rhinitis   ? Anxiety   ? Arthritis   ? Asthma   ? Atrial flutter ([place])   ? Cholelithiases 09/26/2015  ? Diverticulosis   ? Encounter for  antineoplastic immunotherapy 12/09/2016  ? GERD (gastroesophageal reflux disease)   ? H/O hiatal hernia   ? History of blood transfusion   ? History of chemotherapy   ? History of kidney stones   ? History of radiation therapy   ? Internal hemorrhoid   ? Lung cancer ([place])   ? a. Stage IB non-small cell carcinoma, s/p R VATS, wedge resection, RU lobectomy 10/2012.  ? Persistent atrial fibrillation ([place])   ? a. anticoagulated with Xarelto  ? Pneumonia 06/2016  ? [facility]  ? Presence of permanent cardiac pacemaker   ? Pulmonary nodule   ? Tachycardia-bradycardia syndrome ([place])   ? a. 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  ? ? ?ALLERGIES:  is allergic to chlorpheniramine-dm, prednisone, rofecoxib, septra [sulfamethoxazole-trimethoprim], fluticasone, molds & smuts, and penicillins. ? ?MEDICATIONS:  ?Current Outpatient Medications  ?Medication Sig Dispense Refill  ? acetaminophen (TYLENOL) 500 MG tablet Take 500 mg by mouth 3 (three) times daily as needed for moderate pain or fever.     ? albuterol (PROVENTIL HFA;VENTOLIN HFA) 108 (90 BASE) MCG/ACT inhaler Inhale 2 puffs into the lungs every 6 (six) hours as needed for wheezing or shortness of breath.     ? azelastine (ASTELIN) 0.1 % nasal spray Place 1 spray into both nostrils 2 (two) times daily. Use in each nostril as directed    ? benzonatate (TESSALON) 200 MG capsule Take 1 capsule (200 mg total) by mouth 3 (three) times daily as needed for cough. (Patient not taking: Reported on 10/28/2021) 20  capsule 0  ? calcium carbonate (TUMS - DOSED IN MG ELEMENTAL CALCIUM) 500 MG chewable tablet Chew 1 tablet by mouth 2 (two) times daily as needed for indigestion or heartburn.     ? diltiazem (CARTIA XT) 240 MG 24 hr capsule Take 1 capsule (240 mg total) by mouth daily. 90 capsule 3  ? dimenhyDRINATE (DRAMAMINE) 50 MG tablet Take 12.5 mg by mouth every 8 (eight) hours as needed for dizziness.    ? hydrocortisone 1 % lotion Apply 1 application topically 2 (two) times  daily. 118 mL 0  ? ipratropium-albuterol (DUONEB) 0.5-2.5 (3) MG/3ML SOLN Take 3 mLs by nebulization every 6 (six) hours as needed (shortness of breath or wheezing).     ? levothyroxine (SYNTHROID) 50 MCG tablet TAKE 1 TABLET(50 MCG) BY MOUTH DAILY BEFORE AND BREAKFAST (Patient not taking: Reported on 10/28/2021) 90 tablet 0  ? lidocaine-prilocaine (EMLA) cream APPLY TOPICALLY TO TO THE AFFECTED AREA AS NEEDED 30 g 4  ? loratadine (CLARITIN) 10 MG tablet Take 10 mg by mouth daily.    ? meclizine (ANTIVERT) 12.5 MG tablet     ? OVER THE COUNTER MEDICATION Take 5 mLs by mouth daily as needed (cough). [employer] kids cough syrup    ? Polyvinyl Alcohol-Povidone (REFRESH OP) Place 1 drop into both eyes daily as needed (dry eyes).    ? XARELTO 20 MG TABS tablet TAKE 1 TABLET(20 MG) BY MOUTH DAILY WITH SUPPER 30 tablet 5  ? ?No current facility-administered medications for this visit.  ? ? ?SURGICAL HISTORY:  ?Past Surgical History:  ?Procedure Laterality Date  ? CARDIOVERSION N/A 02/19/2018  ? Procedure: CARDIOVERSION;  Surgeon: Sanda Klein, MD;  Location: [facility];  Service: Cardiovascular;  Laterality: N/A;  ? CARDIOVERSION N/A 04/30/2018  ? Procedure: CARDIOVERSION;  Surgeon: Thayer Headings, MD;  Location: [facility];  Service: Cardiovascular;  Laterality: N/A;  ? CATARACT EXTRACTION W/PHACO Right 08/14/2016  ? Procedure: CATARACT EXTRACTION PHACO AND INTRAOCULAR LENS PLACEMENT RIGHT EYE CDE 10.53;  Surgeon: Tonny Branch, MD;  Location: [facility];  Service: Ophthalmology;  Laterality: Right;  right  ? CATARACT EXTRACTION W/PHACO Left 09/25/2016  ? Procedure: CATARACT EXTRACTION PHACO AND INTRAOCULAR LENS PLACEMENT (IOC);  Surgeon: Tonny Branch, MD;  Location: [facility];  Service: Ophthalmology;  Laterality: Left;  left ?CDE 8.81  ? COLONOSCOPY    ? [facility]: cattered sigmoid diverticula, internal hemorrhoids, sessile polyp in ascending and mid transverse colon (tubular adenoma).   ? colonoscopy with polypectomy  2015  ?  ESOPHAGOGASTRODUODENOSCOPY    ? [facility]: bile reflux in stomach, mild gastritis, hiatal hernia  ? ESOPHAGOGASTRODUODENOSCOPY N/A 01/31/2016  ? Procedure: ESOPHAGOGASTRODUODENOSCOPY (EGD);  Surgeon: [name], MD;  [facility];  Service: Endoscopy;  Laterality: N/A;  1200  ? IR FLUORO GUIDE PORT INSERTION RIGHT  06/30/2017  ? IR US GUIDE VASC ACCESS RIGHT  06/30/2017  ? LOBECTOMY Right 10/27/2012  ? Procedure: LOBECTOMY;  Surgeon: [name], MD;  Location: [facility];  Service: Thoracic;  Laterality: Right;   RIGHT UPPER LOBECTOMY & Node Dissection  ? LYMPH NODE BIOPSY Right 05/01/2014  ? Procedure: LYMPH NODE BIOPSY, Right Axillary;  Surgeon: [name], MD;  Location: [facility];  Service: Thoracic;  Laterality: Right;  ? PACEMAKER IMPLANT N/A 10/30/2017  ? SJM Assurity MRI PPM implanted by Dr [name] once leadless pacemaker battery depleted  ? PARTIAL HYSTERECTOMY    ? PERMANENT PACEMAKER INSERTION N/A 09/14/2012  ? Nanostim (SJM) leadless pacemaker (LEADLESS II  STUDY PATIENT) implanted by Dr [name], no longer functioning, device abandoned.  ? VIDEO ASSISTED THORACOSCOPY (VATS)/WEDGE RESECTION Right 10/27/2012  ? Procedure: VIDEO ASSISTED THORACOSCOPY (VATS),RGHT UPPER LOBE LUNG WEDGE RESECTION;  Surgeon: [name], MD;  Location: [facility];  Service: Thoracic;  Laterality: Right;  ? ? ?REVIEW OF SYSTEMS:  A comprehensive review of systems was negative.  ? ?PHYSICAL EXAMINATION: General appearance: alert, cooperative, fatigued, and no distress ?Head: Normocephalic, without obvious abnormality, atraumatic ?Neck: no adenopathy, no JVD, supple, symmetrical, trachea midline, and thyroid not enlarged, symmetric, no tenderness/mass/nodules ?Lymph nodes: Cervical, supraclavicular, and axillary nodes normal. ?Resp: clear to auscultation bilaterally ?Back: symmetric, no curvature. ROM normal. No CVA tenderness. ?Cardio: regular rate and rhythm, S1, S2 normal, no murmur, click, rub or  gallop ?GI: soft, non-tender; bowel sounds normal; no masses,  no organomegaly ?Extremities: extremities normal, atraumatic, no cyanosis or edema ? ?ECOG PERFORMANCE STATUS: 1 - Symptomatic but completely ambulatory ? ?Blood

## 2021-12-23 ENCOUNTER — Encounter: Payer: Self-pay | Admitting: *Deleted

## 2021-12-23 DIAGNOSIS — C3491 Malignant neoplasm of unspecified part of right bronchus or lung: Secondary | ICD-10-CM

## 2021-12-23 NOTE — Progress Notes (Signed)
Oncology Nurse Navigator Documentation     12/23/2021   10:00 AM 11/29/2021    9:00 AM 11/08/2021   12:00 PM 10/22/2021   10:00 AM 09/30/2021   12:00 PM 09/25/2021    3:00 PM 09/24/2021   10:00 AM  Oncology Nurse Navigator Flowsheets  Navigator Location [facility]  Navigator Encounter Type Telephone Telephone Appt/Treatment Plan Review Clinic/MDC Telephone Telephone Clinic/MDC  Telephone Incoming Call;Outgoing Call Outgoing Call  Clinic/MDC Follow-up [employer];Incoming Call Outgoing Call   Patient Visit Type   Other MedOnc Other Other MedOnc  Treatment Phase    Treatment Treatment Other Treatment  Barriers/Navigation Needs Coordination of Care;Education Education;Coordination of Care Coordination of Care Education Coordination of Care;Education Coordination of Care;Education Education  Education Other Other  Understanding Cancer/ Treatment Options;Other Other Other Other  Interventions Coordination of Care;Education/I received a call from Nancy Hodges. She wanted to get her scans scheduled with her lab work.  Scans are not authorized so I am unable to schedule at this time. I explained to patient. Will follow up on authorization.  Education;Coordination of Care Coordination of Care Education;Psycho-Social Support Coordination of Care;Education;Psycho-Social Support Education;Coordination of Care Education;Psycho-Social Support  Acuity Level 2-Minimal Needs (1-2 Barriers Identified) Level 2-Minimal Needs (1-2 Barriers Identified) Level 2-Minimal Needs (1-2 Barriers Identified) Level 2-Minimal Needs (1-2 Barriers Identified) Level 2-Minimal Needs (1-2 Barriers Identified) Level 2-Minimal Needs (1-2 Barriers Identified) Level 2-Minimal Needs (1-2 Barriers Identified)  Coordination of Care Other Other Other  Appts Other   Education Method Verbal Verbal  Verbal Verbal Verbal Verbal  Time Spent with  Patient 30 30 30 30 30 30  30

## 2021-12-23 NOTE — Progress Notes (Signed)
I followed up on scan authorization. Scans are authorized. I called radiology and was able to schedule. I called patient back with an update on appt time and place and pre-procedure instructions. She verbalized understanding.

## 2021-12-27 ENCOUNTER — Encounter: Payer: Self-pay | Admitting: Radiation Oncology

## 2022-01-02 ENCOUNTER — Ambulatory Visit: Payer: Self-pay | Admitting: Radiation Oncology

## 2022-01-05 NOTE — Progress Notes (Incomplete)
  Radiation Oncology         [phone] ________________________________  Patient Name: Nancy Hodges MRN: [id number] DOB: 1946-04-05 Referring Physician: Curt Bears (Profile Not Attached) Date of Service: 11/28/2021  [facility] [place]                                                        End Of Treatment Note  Diagnoses: C34.91-Malignant neoplasm of unspecified part of right bronchus or lung  Cancer Staging: The primary encounter diagnosis was Non-small cell carcinoma of right lung, stage 4 ([place]). A diagnosis of Primary malignant neoplasm of lung metastatic to other site, unspecified laterality [facility]) was also pertinent to this visit.   Metastatic non-small cell lung cancer, adenocarcinoma. This was initially diagnosed as stage IB (T2a, N0, M0) in March of 2014, status post right upper lobectomy with lymph node dissection but the patient has evidence for disease recurrence in the right axilla status post resection. Now with recent evidence of various enlarging pulmonary nodules in February 2023.    Cancer Staging  Non-small cell carcinoma of right lung, stage 4 (HCC) Staging form: Lung, AJCC 7th Edition - Clinical: Stage IV (T2a, N0, M1b) - Signed by Curt Bears, MD on 05/13/2014 - Pathologic: No stage assigned - Unsigned  Intent: Curative  Radiation Treatment Dates: 11/18/2021 through 11/28/2021 Site Technique Total Dose (Gy) Dose per Fx (Gy) Completed Fx Beam Energies  Lung, Right: Lung_R IMRT 50/50 10 5/5 6XFFF  Lung, Left: Lung_L IMRT 50/50 10 5/5 6XFFF   Narrative: The patient tolerated radiation therapy relatively well. During her final weekly treatment check on 11/20/21, the patient reported lower back pain, occasional shortness of breath with activity, and occasional productive cough with green or yellowish phlegm. She denied any other symptoms.   Plan: The patient will follow-up with radiation oncology in one month  .  ________________________________________________ -----------------------------------  Blair Promise, PhD, MD  This document serves as a record of services personally performed by Gery Pray, MD. It was created on his behalf by Roney Mans, a trained medical scribe. The creation of this record is based on the scribe's personal observations and the provider's statements to them. This document has been checked and approved by the attending provider.

## 2022-01-05 NOTE — Progress Notes (Signed)
Radiation Oncology         [phone] ________________________________  Name: Nancy Hodges MRN: [id number]  Date: 01/06/2022  DOB: 12-23-1945  Follow-Up Visit Note  CC: [name], [name]  Curt Bears, MD  No diagnosis found.  Diagnosis: The primary encounter diagnosis was Non-small cell carcinoma of right lung, stage 4 ([place]). A diagnosis of Primary malignant neoplasm of lung metastatic to other site, unspecified laterality [facility]) was also pertinent to this visit.   Metastatic non-small cell lung cancer, adenocarcinoma. This was initially diagnosed as stage IB (T2a, N0, M0) in March of 2014, status post right upper lobectomy with lymph node dissection but the patient has evidence for disease recurrence in the right axilla status post resection. Now with recent evidence of various enlarging pulmonary nodules in February 2023.    Cancer Staging  Non-small cell carcinoma of right lung, stage 4 (HCC) Staging form: Lung, AJCC 7th Edition - Clinical: Stage IV (T2a, N0, M1b) - Signed by Curt Bears, MD on 05/13/2014 - Pathologic: No stage assigned - Unsigned  Interval Since Last Radiation: 1 month and 9 days   Intent: Curative  Radiation Treatment Dates: 11/18/2021 through 11/28/2021 Site Technique Total Dose (Gy) Dose per Fx (Gy) Completed Fx Beam Energies  Lung, Right: Lung_R IMRT 50/50 10 5/5 6XFFF  Lung, Left: Lung_L IMRT 50/50 10 5/5 6XFFF    Narrative:  The patient returns today for routine 1 month follow-up. The patient tolerated radiation therapy relatively well. During her final weekly treatment check on 11/20/21, the patient reported lower back pain, occasional shortness of breath with activity, and occasional productive cough with green or yellowish phlegm. She denied any other symptoms.   Since completing RT, the patient followed up with Dr. Julien Nordmann on 12/17/21. During which time, the patient was noted to be tolerating immunotherapy (nivolumab) well and  denied any complaints pertaining to RT or any symptoms otherwise. She proceeded with cycle 61 of nivolumab during this visit.    Otherwise, no significant interval history since the patient was last seen.   ***                              Allergies:  is allergic to chlorpheniramine-dm, prednisone, rofecoxib, septra [sulfamethoxazole-trimethoprim], fluticasone, molds & smuts, and penicillins.  Meds: Current Outpatient Medications  Medication Sig Dispense Refill   acetaminophen (TYLENOL) 500 MG tablet Take 500 mg by mouth 3 (three) times daily as needed for moderate pain or fever.      albuterol (PROVENTIL HFA;VENTOLIN HFA) 108 (90 BASE) MCG/ACT inhaler Inhale 2 puffs into the lungs every 6 (six) hours as needed for wheezing or shortness of breath.      azelastine (ASTELIN) 0.1 % nasal spray Place 1 spray into both nostrils 2 (two) times daily. Use in each nostril as directed     benzonatate (TESSALON) 200 MG capsule Take 1 capsule (200 mg total) by mouth 3 (three) times daily as needed for cough. (Patient not taking: Reported on 10/28/2021) 20 capsule 0   calcium carbonate (TUMS - DOSED IN MG ELEMENTAL CALCIUM) 500 MG chewable tablet Chew 1 tablet by mouth 2 (two) times daily as needed for indigestion or heartburn.      diltiazem (CARTIA XT) 240 MG 24 hr capsule Take 1 capsule (240 mg total) by mouth daily. 90 capsule 3   dimenhyDRINATE (DRAMAMINE) 50 MG tablet Take 12.5 mg by mouth every 8 (eight) hours as needed for  dizziness.     hydrocortisone 1 % lotion Apply 1 application topically 2 (two) times daily. 118 mL 0   ipratropium-albuterol (DUONEB) 0.5-2.5 (3) MG/3ML SOLN Take 3 mLs by nebulization every 6 (six) hours as needed (shortness of breath or wheezing).      levothyroxine (SYNTHROID) 50 MCG tablet TAKE 1 TABLET(50 MCG) BY MOUTH DAILY BEFORE AND BREAKFAST (Patient not taking: Reported on 10/28/2021) 90 tablet 0   lidocaine-prilocaine (EMLA) cream APPLY TOPICALLY TO TO THE AFFECTED AREA  AS NEEDED 30 g 4   loratadine (CLARITIN) 10 MG tablet Take 10 mg by mouth daily.     meclizine (ANTIVERT) 12.5 MG tablet      OVER THE COUNTER MEDICATION Take 5 mLs by mouth daily as needed (cough). [employer]'s kids cough syrup     Polyvinyl Alcohol-Povidone (REFRESH OP) Place 1 drop into both eyes daily as needed (dry eyes).     XARELTO 20 MG TABS tablet TAKE 1 TABLET(20 MG) BY MOUTH DAILY WITH SUPPER 30 tablet 5   No current facility-administered medications for this encounter.    Physical Findings: The patient is in no acute distress. Patient is alert and oriented.  vitals were not taken for this visit. .  No significant changes. Lungs are clear to auscultation bilaterally. Heart has regular rate and rhythm. No palpable cervical, supraclavicular, or axillary adenopathy. Abdomen soft, non-tender, normal bowel sounds. ***   Lab Findings: Lab Results  Component Value Date   WBC 4.5 12/17/2021   HGB 13.3 12/17/2021   HCT 38.5 12/17/2021   MCV 97.5 12/17/2021   PLT 124 (L) 12/17/2021    Radiographic Findings: No results found.  Impression:  Metastatic non-small cell lung cancer, adenocarcinoma. This was initially diagnosed as stage IB (T2a, N0, M0) in March of 2014, status post right upper lobectomy with lymph node dissection but the patient has evidence for disease recurrence in the right axilla status post resection. Now with recent evidence of various enlarging pulmonary nodules in February 2023.   The patient is recovering from the effects of radiation.  ***  Plan:  ***   *** minutes of total time was spent for this patient encounter, including preparation, face-to-face counseling with the patient and coordination of care, physical exam, and documentation of the encounter. ____________________________________  Blair Promise, PhD, MD  This document serves as a record of services personally performed by Gery Pray, MD. It was created on his behalf by Roney Mans, a trained  medical scribe. The creation of this record is based on the scribe's personal observations and the provider's statements to them. This document has been checked and approved by the attending provider.

## 2022-01-06 ENCOUNTER — Ambulatory Visit
Admission: RE | Admit: 2022-01-06 | Discharge: 2022-01-06 | Disposition: A | Discharge status: Home / Self Care | Payer: Medicare Other | Source: Ambulatory Visit | Attending: Radiation Oncology | Admitting: Radiation Oncology

## 2022-01-06 ENCOUNTER — Other Ambulatory Visit: Payer: Self-pay

## 2022-01-06 VITALS — BP 137/66 | HR 82 | Temp 97.5°F | Resp 18 | Ht 62.0 in | Wt 143.6 lb

## 2022-01-06 DIAGNOSIS — Z923 Personal history of irradiation: Secondary | ICD-10-CM | POA: Insufficient documentation

## 2022-01-06 DIAGNOSIS — Z79899 Other long term (current) drug therapy: Secondary | ICD-10-CM | POA: Insufficient documentation

## 2022-01-06 DIAGNOSIS — C3491 Malignant neoplasm of unspecified part of right bronchus or lung: Secondary | ICD-10-CM | POA: Diagnosis not present

## 2022-01-06 DIAGNOSIS — Z7901 Long term (current) use of anticoagulants: Secondary | ICD-10-CM | POA: Diagnosis not present

## 2022-01-06 NOTE — Progress Notes (Signed)
Nancy Hodges is here today for follow up post radiation to the lung.  Lung Side: bil  Does the patient complain of any of the following: Pain:lower back - from activity Shortness of breath w/wo exertion: no Cough: every now and then - occasional green sputum - takes children's cough syrup as needed. Reports cough has improved. Hemoptysis: no Pain with swallowing: no Swallowing/choking concerns: no Appetite: eating healthier, has taste changes from chemotherapy. Energy Level: good Post radiation skin Changes: no    Additional comments if applicable: None.  BP 137/66 (BP Location: Left Arm, Patient Position: Sitting, Cuff Size: Normal)   Pulse 82   Temp (!) 97.5 F (36.4 C)   Resp 18   Ht 5\' 2"  (1.575 m)   Wt 143 lb 9.6 oz (65.1 kg)   SpO2 99%   BMI 26.26 kg/m

## 2022-01-09 ENCOUNTER — Other Ambulatory Visit: Payer: Self-pay

## 2022-01-09 ENCOUNTER — Inpatient Hospital Stay: Payer: Medicare Other | Attending: Internal Medicine

## 2022-01-09 ENCOUNTER — Ambulatory Visit (HOSPITAL_COMMUNITY)
Admission: RE | Admit: 2022-01-09 | Discharge: 2022-01-09 | Disposition: A | Discharge status: Home / Self Care | Payer: Medicare Other | Source: Ambulatory Visit | Attending: Internal Medicine | Admitting: Internal Medicine

## 2022-01-09 DIAGNOSIS — K8020 Calculus of gallbladder without cholecystitis without obstruction: Secondary | ICD-10-CM | POA: Diagnosis not present

## 2022-01-09 DIAGNOSIS — Z9221 Personal history of antineoplastic chemotherapy: Secondary | ICD-10-CM | POA: Insufficient documentation

## 2022-01-09 DIAGNOSIS — Z90711 Acquired absence of uterus with remaining cervical stump: Secondary | ICD-10-CM | POA: Diagnosis not present

## 2022-01-09 DIAGNOSIS — Z950 Presence of cardiac pacemaker: Secondary | ICD-10-CM | POA: Diagnosis not present

## 2022-01-09 DIAGNOSIS — Y842 Radiological procedure and radiotherapy as the cause of abnormal reaction of the patient, or of later complication, without mention of misadventure at the time of the procedure: Secondary | ICD-10-CM | POA: Diagnosis not present

## 2022-01-09 DIAGNOSIS — I700 Atherosclerosis of aorta: Secondary | ICD-10-CM | POA: Diagnosis not present

## 2022-01-09 DIAGNOSIS — C3491 Malignant neoplasm of unspecified part of right bronchus or lung: Secondary | ICD-10-CM

## 2022-01-09 DIAGNOSIS — Z923 Personal history of irradiation: Secondary | ICD-10-CM | POA: Insufficient documentation

## 2022-01-09 DIAGNOSIS — E032 Hypothyroidism due to medicaments and other exogenous substances: Secondary | ICD-10-CM

## 2022-01-09 DIAGNOSIS — Z79899 Other long term (current) drug therapy: Secondary | ICD-10-CM | POA: Insufficient documentation

## 2022-01-09 DIAGNOSIS — J701 Chronic and other pulmonary manifestations due to radiation: Secondary | ICD-10-CM | POA: Insufficient documentation

## 2022-01-09 DIAGNOSIS — K7460 Unspecified cirrhosis of liver: Secondary | ICD-10-CM | POA: Diagnosis not present

## 2022-01-09 DIAGNOSIS — C773 Secondary and unspecified malignant neoplasm of axilla and upper limb lymph nodes: Secondary | ICD-10-CM | POA: Insufficient documentation

## 2022-01-09 DIAGNOSIS — K760 Fatty (change of) liver, not elsewhere classified: Secondary | ICD-10-CM | POA: Diagnosis not present

## 2022-01-09 DIAGNOSIS — J432 Centrilobular emphysema: Secondary | ICD-10-CM | POA: Insufficient documentation

## 2022-01-09 DIAGNOSIS — Z85118 Personal history of other malignant neoplasm of bronchus and lung: Secondary | ICD-10-CM | POA: Insufficient documentation

## 2022-01-09 DIAGNOSIS — Z5112 Encounter for antineoplastic immunotherapy: Secondary | ICD-10-CM

## 2022-01-09 DIAGNOSIS — Y781 Therapeutic (nonsurgical) and rehabilitative radiological devices associated with adverse incidents: Secondary | ICD-10-CM | POA: Insufficient documentation

## 2022-01-09 DIAGNOSIS — Z902 Acquired absence of lung [part of]: Secondary | ICD-10-CM | POA: Diagnosis not present

## 2022-01-09 DIAGNOSIS — J984 Other disorders of lung: Secondary | ICD-10-CM | POA: Diagnosis not present

## 2022-01-09 DIAGNOSIS — Z95828 Presence of other vascular implants and grafts: Secondary | ICD-10-CM

## 2022-01-09 DIAGNOSIS — C3411 Malignant neoplasm of upper lobe, right bronchus or lung: Secondary | ICD-10-CM | POA: Insufficient documentation

## 2022-01-09 LAB — CMP (CANCER CENTER ONLY)
ALT: 20 U/L (ref 0–44)
AST: 33 U/L (ref 15–41)
Albumin: 3.9 g/dL (ref 3.5–5.0)
Alkaline Phosphatase: 105 U/L (ref 38–126)
Anion gap: 6 (ref 5–15)
BUN: 7 mg/dL — ABNORMAL LOW (ref 8–23)
CO2: 29 mmol/L (ref 22–32)
Calcium: 9.5 mg/dL (ref 8.9–10.3)
Chloride: 103 mmol/L (ref 98–111)
Creatinine: 0.61 mg/dL (ref 0.44–1.00)
GFR, Estimated: >60 mL/min (ref >60)
Glucose, Bld: 151 mg/dL — ABNORMAL HIGH (ref 70–99)
Potassium: 4.3 mmol/L (ref 3.5–5.1)
Sodium: 138 mmol/L (ref 135–145)
Total Bilirubin: 2.2 mg/dL — ABNORMAL HIGH (ref 0.3–1.2)
Total Protein: 6.9 g/dL (ref 6.5–8.1)

## 2022-01-09 LAB — CBC WITH DIFFERENTIAL (CANCER CENTER ONLY)
Abs Immature Granulocytes: 0.01 10*3/uL (ref 0.00–0.07)
Basophils Absolute: 0 10*3/uL (ref 0.0–0.1)
Basophils Relative: 1 %
Eosinophils Absolute: 0.2 10*3/uL (ref 0.0–0.5)
Eosinophils Relative: 3 %
HCT: 37.9 % (ref 36.0–46.0)
Hemoglobin: 13.5 g/dL (ref 12.0–15.0)
Immature Granulocytes: 0 %
Lymphocytes Relative: 11 %
Lymphs Abs: 0.6 10*3/uL — ABNORMAL LOW (ref 0.7–4.0)
MCH: 34.3 pg — ABNORMAL HIGH (ref 26.0–34.0)
MCHC: 35.6 g/dL (ref 30.0–36.0)
MCV: 96.2 fL (ref 80.0–100.0)
Monocytes Absolute: 0.5 10*3/uL (ref 0.1–1.0)
Monocytes Relative: 9 %
Neutro Abs: 3.7 10*3/uL (ref 1.7–7.7)
Neutrophils Relative %: 76 %
Platelet Count: 141 10*3/uL — ABNORMAL LOW (ref 150–400)
RBC: 3.94 MIL/uL (ref 3.87–5.11)
RDW: 13.2 % (ref 11.5–15.5)
WBC Count: 4.9 10*3/uL (ref 4.0–10.5)
nRBC: 0 % (ref 0.0–0.2)

## 2022-01-09 LAB — TSH: TSH: 3.429 u[IU]/mL (ref 0.350–4.500)

## 2022-01-09 MED ORDER — HEPARIN SOD (PORK) LOCK FLUSH 100 UNIT/ML IV SOLN
INTRAVENOUS | Status: AC
  Filled 2022-01-09: qty 5

## 2022-01-09 MED ORDER — HEPARIN SOD (PORK) LOCK FLUSH 100 UNIT/ML IV SOLN
500.0000 [IU] | Freq: Once | INTRAVENOUS | Status: AC
  Administered 2022-01-09: 500 [IU] via INTRAVENOUS

## 2022-01-09 MED ORDER — SODIUM CHLORIDE 0.9% FLUSH
10.0000 mL | INTRAVENOUS | Status: DC | PRN
  Administered 2022-01-09: 10 mL via INTRAVENOUS

## 2022-01-09 MED ORDER — SODIUM CHLORIDE (PF) 0.9 % IJ SOLN
INTRAMUSCULAR | Status: AC
  Filled 2022-01-09: qty 50

## 2022-01-09 MED ORDER — IOHEXOL 300 MG/ML  SOLN
100.0000 mL | Freq: Once | INTRAMUSCULAR | Status: AC | PRN
  Administered 2022-01-09: 100 mL via INTRAVENOUS

## 2022-01-10 ENCOUNTER — Other Ambulatory Visit: Payer: Medicare Other

## 2022-01-14 ENCOUNTER — Inpatient Hospital Stay (HOSPITAL_BASED_OUTPATIENT_CLINIC_OR_DEPARTMENT_OTHER): Payer: Medicare Other | Admitting: Internal Medicine

## 2022-01-14 ENCOUNTER — Other Ambulatory Visit: Payer: Medicare Other

## 2022-01-14 ENCOUNTER — Encounter: Payer: Self-pay | Admitting: Internal Medicine

## 2022-01-14 ENCOUNTER — Inpatient Hospital Stay: Payer: Medicare Other

## 2022-01-14 ENCOUNTER — Other Ambulatory Visit: Payer: Self-pay

## 2022-01-14 VITALS — BP 133/61 | HR 69 | Temp 97.1°F | Resp 17 | Wt 141.4 lb

## 2022-01-14 DIAGNOSIS — C3491 Malignant neoplasm of unspecified part of right bronchus or lung: Secondary | ICD-10-CM

## 2022-01-14 DIAGNOSIS — C773 Secondary and unspecified malignant neoplasm of axilla and upper limb lymph nodes: Secondary | ICD-10-CM | POA: Diagnosis present

## 2022-01-14 DIAGNOSIS — Z5112 Encounter for antineoplastic immunotherapy: Secondary | ICD-10-CM

## 2022-01-14 DIAGNOSIS — Z79899 Other long term (current) drug therapy: Secondary | ICD-10-CM | POA: Diagnosis not present

## 2022-01-14 DIAGNOSIS — C3411 Malignant neoplasm of upper lobe, right bronchus or lung: Secondary | ICD-10-CM | POA: Diagnosis present

## 2022-01-14 MED ORDER — SODIUM CHLORIDE 0.9% FLUSH
10.0000 mL | INTRAVENOUS | Status: DC | PRN
  Administered 2022-01-14: 10 mL

## 2022-01-14 MED ORDER — HEPARIN SOD (PORK) LOCK FLUSH 100 UNIT/ML IV SOLN
500.0000 [IU] | Freq: Once | INTRAVENOUS | Status: AC | PRN
  Administered 2022-01-14: 500 [IU]

## 2022-01-14 MED ORDER — SODIUM CHLORIDE 0.9 % IV SOLN: Freq: Once | INTRAVENOUS | Status: AC

## 2022-01-14 MED ORDER — SODIUM CHLORIDE 0.9 % IV SOLN
480.0000 mg | Freq: Once | INTRAVENOUS | Status: AC
  Administered 2022-01-14: 480 mg via INTRAVENOUS
  Filled 2022-01-14: qty 48

## 2022-01-14 NOTE — Progress Notes (Signed)
Ok to treat w/ Tbili = 2.2 (01/09/22) per Dr. Julien Nordmann.  Kennith Center, Pharm.D., CPP 01/14/2022@10 :19 AM

## 2022-01-14 NOTE — Patient Instructions (Signed)
[facility]   Discharge Instructions: Thank you for choosing [facility] to provide your oncology and hematology care.   If you have a lab appointment with the [facility], please go directly to the [facility] and check in at the registration area.   Wear comfortable clothing and clothing appropriate for easy access to any Portacath or PICC line.   We strive to give you quality time with your provider. You may need to reschedule your appointment if you arrive late (15 or more minutes).  Arriving late affects you and other patients whose appointments are after yours.  Also, if you miss three or more appointments without notifying the office, you may be dismissed from the clinic at the provider's discretion.      For prescription refill requests, have your pharmacy contact our office and allow 72 hours for refills to be completed.    Today you received the following chemotherapy and/or immunotherapy agents: Nivolumab (Opdivo)      To help prevent nausea and vomiting after your treatment, we encourage you to take your nausea medication as directed.  BELOW ARE SYMPTOMS THAT SHOULD BE REPORTED IMMEDIATELY: *FEVER GREATER THAN 100.4 F (38 C) OR HIGHER *CHILLS OR SWEATING *NAUSEA AND VOMITING THAT IS NOT CONTROLLED WITH YOUR NAUSEA MEDICATION *UNUSUAL SHORTNESS OF BREATH *UNUSUAL BRUISING OR BLEEDING *URINARY PROBLEMS (pain or burning when urinating, or frequent urination) *BOWEL PROBLEMS (unusual diarrhea, constipation, pain near the anus) TENDERNESS IN MOUTH AND THROAT WITH OR WITHOUT PRESENCE OF ULCERS (sore throat, sores in mouth, or a toothache) UNUSUAL RASH, SWELLING OR PAIN  UNUSUAL VAGINAL DISCHARGE OR ITCHING   Items with * indicate a potential emergency and should be followed up as soon as possible or go to the Emergency Department if any problems should occur.  Please show the CHEMOTHERAPY ALERT CARD or IMMUNOTHERAPY ALERT CARD at  check-in to the Emergency Department and triage nurse.  Should you have questions after your visit or need to cancel or reschedule your appointment, please contact [facility]  Dept: [phone]  and follow the prompts.  Office hours are 8:00 a.m. to 4:30 p.m. Monday - Friday. Please note that voicemails left after 4:00 p.m. may not be returned until the following business day.  We are closed weekends and major holidays. You have access to a nurse at all times for urgent questions. Please call the main number to the clinic Dept: [phone] and follow the prompts.   For any non-urgent questions, you may also contact your provider using MyChart. We now offer e-Visits for anyone 13 and older to request care online for non-urgent symptoms. For details visit [email or web address].   Also download the MyChart app! Go to the app store, search "MyChart", open the app, select [facility], and log in with your MyChart username and password.  Due to Covid, a mask is required upon entering the hospital/clinic. If you do not have a mask, one will be given to you upon arrival. For doctor visits, patients may have 1 support person aged 8 or older with them. For treatment visits, patients cannot have anyone with them due to current Covid guidelines and our immunocompromised population.

## 2022-01-14 NOTE — Progress Notes (Signed)
[facility] [phone]) [phone]   [phone]  OFFICE PROGRESS NOTE  Harris, [name], [name] [address] [place] [address]  DIAGNOSIS: Metastatic non-small cell lung cancer, adenocarcinoma. This was initially diagnosed as stage IB (T2a, N0, M0) in March of 2014, status post right upper lobectomy with lymph node dissection but the patient has evidence for disease recurrence in the right axilla status post resection.  PRIOR THERAPY:  1) Status post right axillary lymph node biopsy on 05/01/2014 under the care of Dr. Roxan Hockey. 2) Systemic chemotherapy with carboplatin for AUC of 5 and Alimta 500 mg/M2 every 3 weeks. First cycle 05/31/2014. Status post 6 cycles. Last dose was given 09/22/2014. 3) palliative radiotherapy to the subcarinal lymph node under the care of Dr. Sondra Come. 4) Nivolumab 240 mg IV every 2 weeks. First dose 12/23/2016. Status post 6 cycles. 5) SBRT to the enlarging pulmonary nodules under the care of Dr. Sondra Come.  CURRENT THERAPY:  Nivolumab 480 mg IV every 4 weeks. First dose 03/17/2017. Status post 61 cycles.  INTERVAL HISTORY: Nancy Hodges 76 y.o. female returns to the clinic today for follow-up visit.  The patient is feeling fine today with no concerning complaints.  She has been very active and working in her yard there was no limitations.  She denied having any current chest pain, shortness of breath, cough or hemoptysis.  She has no nausea, vomiting, diarrhea or constipation.  She has no headache or visual changes.  She has no recent weight loss or night sweats.  She has no fever or chills.  The patient had repeat CT scan of the chest, abdomen and pelvis performed recently and she is here for evaluation and discussion of her scan results.   MEDICAL HISTORY: Past Medical History:  Diagnosis Date   Allergic rhinitis    Anxiety    Arthritis    Asthma    Atrial flutter ([facility])    Cholelithiases 09/26/2015    Diverticulosis    Encounter for antineoplastic immunotherapy 12/09/2016   GERD (gastroesophageal reflux disease)    H/O hiatal hernia    History of blood transfusion    History of chemotherapy    History of kidney stones    History of radiation therapy    Right and left lung- 11/18/21-11/28/21- Dr. Gery Pray   Internal hemorrhoid    Lung cancer [facility])    a. Stage IB non-small cell carcinoma, s/p R VATS, wedge resection, RU lobectomy 10/2012.   Persistent atrial fibrillation ([facility])    a. 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    ALLERGIES:  is allergic to chlorpheniramine-dm, prednisone, rofecoxib, septra [sulfamethoxazole-trimethoprim], fluticasone, molds & smuts, and penicillins.  MEDICATIONS:  Current Outpatient Medications  Medication Sig Dispense Refill   acetaminophen (TYLENOL) 500 MG tablet Take 500 mg by mouth 3 (three) times daily as needed for moderate pain or fever.      albuterol (PROVENTIL HFA;VENTOLIN HFA) 108 (90 BASE) MCG/ACT inhaler Inhale 2 puffs into the lungs every 6 (six) hours as needed for wheezing or shortness of breath.      azelastine (ASTELIN) 0.1 % nasal spray Place 1 spray into both nostrils 2 (two) times daily. Use in each nostril as directed     benzonatate (TESSALON) 200 MG capsule Take  1 capsule (200 mg total) by mouth 3 (three) times daily as needed for cough. 20 capsule 0   calcium carbonate (TUMS - DOSED IN MG ELEMENTAL CALCIUM) 500 MG chewable tablet Chew 1 tablet by mouth 2 (two) times daily as needed for indigestion or heartburn.      diltiazem (CARTIA XT) 240 MG 24 hr capsule Take 1 capsule (240 mg total) by mouth daily. 90 capsule 3   dimenhyDRINATE (DRAMAMINE) 50 MG tablet Take 12.5 mg by mouth every 8 (eight) hours as needed for dizziness.     hydrocortisone 1 %  lotion Apply 1 application topically 2 (two) times daily. 118 mL 0   ipratropium-albuterol (DUONEB) 0.5-2.5 (3) MG/3ML SOLN Take 3 mLs by nebulization every 6 (six) hours as needed (shortness of breath or wheezing).      levothyroxine (SYNTHROID) 50 MCG tablet TAKE 1 TABLET(50 MCG) BY MOUTH DAILY BEFORE AND BREAKFAST (Patient not taking: Reported on 10/28/2021) 90 tablet 0   lidocaine-prilocaine (EMLA) cream APPLY TOPICALLY TO TO THE AFFECTED AREA AS NEEDED 30 g 4   loratadine (CLARITIN) 10 MG tablet Take 10 mg by mouth daily.     meclizine (ANTIVERT) 12.5 MG tablet      OVER THE COUNTER MEDICATION Take 5 mLs by mouth daily as needed (cough). [employer]'s kids cough syrup     Polyvinyl Alcohol-Povidone (REFRESH OP) Place 1 drop into both eyes daily as needed (dry eyes).     XARELTO 20 MG TABS tablet TAKE 1 TABLET(20 MG) BY MOUTH DAILY WITH SUPPER 30 tablet 5   No current facility-administered medications for this visit.    SURGICAL HISTORY:  Past Surgical History:  Procedure Laterality Date   CARDIOVERSION N/A 02/19/2018   Procedure: CARDIOVERSION;  Surgeon: Sanda Klein, MD;  Location: [facility] ENDOSCOPY;  Service: Cardiovascular;  Laterality: N/A;   CARDIOVERSION N/A 04/30/2018   Procedure: CARDIOVERSION;  Surgeon: Thayer Headings, MD;  Location[facility] ENDOSCOPY;  Service: Cardiovascular;  Laterality: N/A;   CATARACT EXTRACTION W/PHACO Right 08/14/2016   Procedure: CATARACT EXTRACTION PHACO AND INTRAOCULAR LENS PLACEMENT RIGHT EYE CDE 10.53;  Surgeon: Tonny Branch, MD;  Location: [facility];  Service: Ophthalmology;  Laterality: Right;  right   CATARACT EXTRACTION W/PHACO Left 09/25/2016   Procedure: CATARACT EXTRACTION PHACO AND INTRAOCULAR LENS PLACEMENT (IOC);  Surgeon: Tonny Branch, MD;  Location: [facility];  Service: Ophthalmology;  Laterality: Left;  left CDE 8.81   COLONOSCOPY     [facility]: cattered sigmoid diverticula, internal hemorrhoids, sessile polyp in ascending and mid transverse colon (tubular  adenoma).    colonoscopy with polypectomy  2015   ESOPHAGOGASTRODUODENOSCOPY     [facility]: bile reflux in stomach, mild gastritis, hiatal hernia   ESOPHAGOGASTRODUODENOSCOPY N/A 01/31/2016   Procedure: ESOPHAGOGASTRODUODENOSCOPY (EGD);  Surgeon: [name], MD;  Location: [facility];  Service: Endoscopy;  Laterality: N/A;  1200   IR FLUORO GUIDE PORT INSERTION RIGHT  06/30/2017   IR US GUIDE VASC ACCESS RIGHT  06/30/2017   LOBECTOMY Right 10/27/2012   Procedure: LOBECTOMY;  Surgeon: [name], MD;  Location: [facility];  Service: Thoracic;  Laterality: Right;   RIGHT UPPER LOBECTOMY & Node Dissection   LYMPH NODE BIOPSY Right 05/01/2014   Procedure: LYMPH NODE BIOPSY, Right Axillary;  Surgeon: [name], MD;  Location: [facility];  Service: Thoracic;  Laterality: Right;   PACEMAKER IMPLANT N/A 10/30/2017   SJM Assurity MRI PPM implanted by Dr [name] once leadless pacemaker battery depleted   PARTIAL HYSTERECTOMY  PERMANENT PACEMAKER INSERTION N/A 09/14/2012   Nanostim (SJM) leadless pacemaker (LEADLESS II STUDY PATIENT) implanted by Dr [name], no longer functioning, device abandoned.   VIDEO ASSISTED THORACOSCOPY (VATS)/WEDGE RESECTION Right 10/27/2012   Procedure: VIDEO ASSISTED THORACOSCOPY (VATS),RGHT UPPER LOBE LUNG WEDGE RESECTION;  Surgeon: [name], MD;  Location: [facility];  Service: Thoracic;  Laterality: Right;    REVIEW OF SYSTEMS:  Constitutional: negative Eyes: negative Ears, nose, mouth, throat, and face: negative Respiratory: negative Cardiovascular: negative Gastrointestinal: negative Genitourinary:negative Integument/breast: negative Hematologic/lymphatic: negative Musculoskeletal:negative Neurological: negative Behavioral/Psych: negative Endocrine: negative Allergic/Immunologic: negative   PHYSICAL EXAMINATION: General appearance: alert, cooperative, fatigued, and no distress Head: Normocephalic, without obvious abnormality,  atraumatic Neck: no adenopathy, no JVD, supple, symmetrical, trachea midline, and thyroid not enlarged, symmetric, no tenderness/mass/nodules Lymph nodes: Cervical, supraclavicular, and axillary nodes normal. Resp: clear to auscultation bilaterally Back: symmetric, no curvature. ROM normal. No CVA tenderness. Cardio: regular rate and rhythm, S1, S2 normal, no murmur, click, rub or gallop GI: soft, non-tender; bowel sounds normal; no masses,  no organomegaly Extremities: extremities normal, atraumatic, no cyanosis or edema Neurologic: Alert and oriented X 3, normal strength and tone. Normal symmetric reflexes. Normal coordination and gait  ECOG PERFORMANCE STATUS: 1 - Symptomatic but completely ambulatory  Blood pressure 133/61, pulse 69, temperature (!) 97.1 F (36.2 C), temperature source Tympanic, resp. rate 17, weight 141 lb 7 oz (64.2 kg), SpO2 97 %.  LABORATORY DATA: Lab Results  Component Value Date   WBC 4.9 01/09/2022   HGB 13.5 01/09/2022   HCT 37.9 01/09/2022   MCV 96.2 01/09/2022   PLT 141 (L) 01/09/2022      Chemistry      Component Value Date/Time   NA 138 01/09/2022 0959   NA 139 08/05/2017 0902   K 4.3 01/09/2022 0959   K 4.1 08/05/2017 0902   CL 103 01/09/2022 0959   CO2 29 01/09/2022 0959   CO2 28 08/05/2017 0902   BUN 7 (L) 01/09/2022 0959   BUN 10.1 08/05/2017 0902   CREATININE 0.61 01/09/2022 0959   CREATININE 0.8 08/05/2017 0902   GLU 134 (H) 02/18/2017 1410      Component Value Date/Time   CALCIUM 9.5 01/09/2022 0959   CALCIUM 9.5 08/05/2017 0902   ALKPHOS 105 01/09/2022 0959   ALKPHOS 111 08/05/2017 0902   AST 33 01/09/2022 0959   AST 33 08/05/2017 0902   ALT 20 01/09/2022 0959   ALT 34 08/05/2017 0902   BILITOT 2.2 (H) 01/09/2022 0959   BILITOT 1.39 (H) 08/05/2017 0902       RADIOGRAPHIC STUDIES: CT Chest W Contrast  Result Date: 01/10/2022 CLINICAL DATA:  Primary Cancer Type: Lung Imaging Indication: Assess response to therapy  Interval therapy since last imaging? Yes Initial Cancer Diagnosis Date: 10/27/2012; Established by: Biopsy-proven Detailed Pathology: Initial diagnosis stage IB non-small cell lung cancer, adenocarcinoma. Primary Tumor location:  Right upper lobe. Recurrence? Yes; Date(s) of recurrence: 05/01/2014; Established by: Biopsy-proven Surgeries: Right upper lobectomy 10/27/2012. Pacemaker 2019. Partial hysterectomy. Chemotherapy: Yes; Ongoing? No; Most recent administration: 09/22/2014 Immunotherapy?  Yes; Type: Nivolumab; Ongoing? Yes Radiation therapy? Yes Date Range: 11/18/2021 - 11/28/2021; Target: Right and left lungs Date Range: 2017; Target: subcarinal lymph node * Tracking Code: BO * EXAM: CT CHEST, ABDOMEN, AND PELVIS WITH CONTRAST TECHNIQUE: Multidetector CT imaging of the chest, abdomen and pelvis was performed following the standard protocol during bolus administration of intravenous contrast. RADIATION DOSE REDUCTION: This exam was performed according to the departmental dose-optimization program  which includes automated exposure control, adjustment of the mA and/or kV according to patient size and/or use of iterative reconstruction technique. CONTRAST:  180mL OMNIPAQUE IOHEXOL 300 MG/ML  SOLN COMPARISON:  10/08/2021 PET-CT. Most recent CT chest, abdomen and pelvis 09/19/2021. FINDINGS: CT CHEST FINDINGS Cardiovascular: Aortic atherosclerosis. Normal heart size. Left coronary artery calcifications. No pericardial effusion. Mediastinum/Nodes: No enlarged mediastinal, hilar, or axillary lymph nodes. Small hiatal hernia. Thyroid gland, trachea, and esophagus demonstrate no significant findings. Lungs/Pleura: Status post right upper lobectomy with unchanged paramedian radiation fibrosis of the right lower lobe and scarring of the right lung base. Multiple spiculated, previously FDG avid pulmonary nodules are diminished in size, for example a nodule of the peripheral right lower lobe measuring 1.5 x 0.4 cm,  previously 1.6 x 1.2 cm (series 4, image 69). An additional index nodule of the peripheral left costophrenic recess measures 1.5 x 0.6 cm, previously 1.6 x 1.2 cm (series 4, image 125). Mild centrilobular emphysema. No pleural effusion or pneumothorax. Musculoskeletal: No chest wall mass or suspicious osseous lesions identified. CT ABDOMEN PELVIS FINDINGS Hepatobiliary: Hepatic steatosis. Coarse, nodular contour of the liver. No solid liver abnormality is seen. Multiple gallstones. No gallbladder wall thickening, or biliary dilatation. Pancreas: Unremarkable. No pancreatic ductal dilatation or surrounding inflammatory changes. Spleen: Normal in size without significant abnormality. Adrenals/Urinary Tract: Adrenal glands are unremarkable. Kidneys are normal, without renal calculi, solid lesion, or hydronephrosis. Bladder is unremarkable. Stomach/Bowel: Stomach is within normal limits. Appendix appears normal. No evidence of bowel wall thickening, distention, or inflammatory changes. Descending and sigmoid diverticulosis. Vascular/Lymphatic: Aortic atherosclerosis. Numerous unchanged prominent gastrohepatic ligament, celiac axis, and portacaval lymph nodes, not previously FDG avid and most likely reactive in the setting of cirrhosis. Reproductive: Status post hysterectomy. Other: No abdominal wall hernia or abnormality. No ascites. Musculoskeletal: No acute osseous findings. IMPRESSION: 1. Multiple spiculated, previously FDG avid pulmonary nodules are diminished in size, consistent with treatment response. 2. No evidence of new metastatic disease in the chest, abdomen, or pelvis. 3. Status post right upper lobectomy with unchanged paramedian radiation fibrosis of the right lower lobe and scarring of the right lung base. 4. Hepatic steatosis and cirrhosis. 5. Cholelithiasis. 6. Emphysema. Aortic Atherosclerosis (ICD10-I70.0) and Emphysema (ICD10-J43.9). Electronically Signed   By: Delanna Ahmadi M.D.   On: 01/10/2022  10:33   CT Abdomen Pelvis W Contrast  Result Date: 01/10/2022 CLINICAL DATA:  Primary Cancer Type: Lung Imaging Indication: Assess response to therapy Interval therapy since last imaging? Yes Initial Cancer Diagnosis Date: 10/27/2012; Established by: Biopsy-proven Detailed Pathology: Initial diagnosis stage IB non-small cell lung cancer, adenocarcinoma. Primary Tumor location:  Right upper lobe. Recurrence? Yes; Date(s) of recurrence: 05/01/2014; Established by: Biopsy-proven Surgeries: Right upper lobectomy 10/27/2012. Pacemaker 2019. Partial hysterectomy. Chemotherapy: Yes; Ongoing? No; Most recent administration: 09/22/2014 Immunotherapy?  Yes; Type: Nivolumab; Ongoing? Yes Radiation therapy? Yes Date Range: 11/18/2021 - 11/28/2021; Target: Right and left lungs Date Range: 2017; Target: subcarinal lymph node * Tracking Code: BO * EXAM: CT CHEST, ABDOMEN, AND PELVIS WITH CONTRAST TECHNIQUE: Multidetector CT imaging of the chest, abdomen and pelvis was performed following the standard protocol during bolus administration of intravenous contrast. RADIATION DOSE REDUCTION: This exam was performed according to the departmental dose-optimization program which includes automated exposure control, adjustment of the mA and/or kV according to patient size and/or use of iterative reconstruction technique. CONTRAST:  170mL OMNIPAQUE IOHEXOL 300 MG/ML  SOLN COMPARISON:  10/08/2021 PET-CT. Most recent CT chest, abdomen and pelvis 09/19/2021. FINDINGS: CT CHEST FINDINGS Cardiovascular:  Aortic atherosclerosis. Normal heart size. Left coronary artery calcifications. No pericardial effusion. Mediastinum/Nodes: No enlarged mediastinal, hilar, or axillary lymph nodes. Small hiatal hernia. Thyroid gland, trachea, and esophagus demonstrate no significant findings. Lungs/Pleura: Status post right upper lobectomy with unchanged paramedian radiation fibrosis of the right lower lobe and scarring of the right lung base. Multiple  spiculated, previously FDG avid pulmonary nodules are diminished in size, for example a nodule of the peripheral right lower lobe measuring 1.5 x 0.4 cm, previously 1.6 x 1.2 cm (series 4, image 69). An additional index nodule of the peripheral left costophrenic recess measures 1.5 x 0.6 cm, previously 1.6 x 1.2 cm (series 4, image 125). Mild centrilobular emphysema. No pleural effusion or pneumothorax. Musculoskeletal: No chest wall mass or suspicious osseous lesions identified. CT ABDOMEN PELVIS FINDINGS Hepatobiliary: Hepatic steatosis. Coarse, nodular contour of the liver. No solid liver abnormality is seen. Multiple gallstones. No gallbladder wall thickening, or biliary dilatation. Pancreas: Unremarkable. No pancreatic ductal dilatation or surrounding inflammatory changes. Spleen: Normal in size without significant abnormality. Adrenals/Urinary Tract: Adrenal glands are unremarkable. Kidneys are normal, without renal calculi, solid lesion, or hydronephrosis. Bladder is unremarkable. Stomach/Bowel: Stomach is within normal limits. Appendix appears normal. No evidence of bowel wall thickening, distention, or inflammatory changes. Descending and sigmoid diverticulosis. Vascular/Lymphatic: Aortic atherosclerosis. Numerous unchanged prominent gastrohepatic ligament, celiac axis, and portacaval lymph nodes, not previously FDG avid and most likely reactive in the setting of cirrhosis. Reproductive: Status post hysterectomy. Other: No abdominal wall hernia or abnormality. No ascites. Musculoskeletal: No acute osseous findings. IMPRESSION: 1. Multiple spiculated, previously FDG avid pulmonary nodules are diminished in size, consistent with treatment response. 2. No evidence of new metastatic disease in the chest, abdomen, or pelvis. 3. Status post right upper lobectomy with unchanged paramedian radiation fibrosis of the right lower lobe and scarring of the right lung base. 4. Hepatic steatosis and cirrhosis. 5.  Cholelithiasis. 6. Emphysema. Aortic Atherosclerosis (ICD10-I70.0) and Emphysema (ICD10-J43.9). Electronically Signed   By: Delanna Ahmadi M.D.   On: 01/10/2022 10:33    ASSESSMENT AND PLAN: This is a very pleasant 76 years old white female with metastatic non-small cell lung cancer, adenocarcinoma with no actionable mutations status post systemic chemotherapy with carboplatin and Alimta and she is currently undergoing treatment with second line immunotherapy with Nivolumab status post 6 cycles. This was followed by immunotherapy with Nivolumab 480 mg IV every 4 weeks status post 61 cycles. The patient has been tolerating her treatment well except for mild skin rash. Her last CT scans showed multiple new bilateral spiculated pulmonary nodules suspicious for metastatic disease.  The patient also mentions that she had recent chest congestion and cough.  Inflammatory process could not be completely excluded at this point. The patient had a PET scan that showed hypermetabolic primarily subpleural nodules in both lower lobes with a dominant nodule measuring 2.2 x 1.3 cm along the right lower lobe adjacent to the diaphragm with maximum SUV of 15.5.  Other nodules are not hypermetabolic but still suspicious and no evidence of metastatic disease to the neck, abdomen and pelvis or skeleton. The patient underwent SBRT to the enlarging pulmonary nodules under the care of Dr. Sondra Come. The patient has been tolerating her treatment with immunotherapy fairly well with no concerning adverse effects. She had repeat CT scan of the chest, abdomen and pelvis performed recently.  I personally and independently reviewed the scan and discussed the result with the patient today. Her scan showed improvement in the multiple spiculated pulmonary  nodules that was seen previously. I recommended for the patient to continue her current treatment with nivolumab and she will proceed with cycle #62 today. I will see her back for follow-up  visit in 4 weeks for evaluation before the next cycle of her treatment. The patient was advised to call immediately if she has any concerning symptoms in the interval. The patient voices understanding of current disease status and treatment options and is in agreement with the current care plan.  All questions were answered. The patient knows to call the clinic with any problems, questions or concerns. We can certainly see the patient much sooner if necessary.  Disclaimer: This note was dictated with voice recognition software. Similar sounding words can inadvertently be transcribed and may not be corrected upon review.

## 2022-01-21 ENCOUNTER — Encounter: Payer: Self-pay | Admitting: *Deleted

## 2022-01-21 NOTE — Progress Notes (Signed)
I followed up on Nancy Hodges's treatment plan schedule. She is set up at this time.

## 2022-01-29 ENCOUNTER — Ambulatory Visit (INDEPENDENT_AMBULATORY_CARE_PROVIDER_SITE_OTHER): Payer: Medicare Other

## 2022-01-29 DIAGNOSIS — I495 Sick sinus syndrome: Secondary | ICD-10-CM

## 2022-01-30 LAB — CUP PACEART REMOTE DEVICE CHECK
Battery Remaining Longevity: 84 mo
Battery Remaining Percentage: 65 %
Battery Voltage: 3.02 V
Brady Statistic RV Percent Paced: 31 %
Date Time Interrogation Session: 20230628020013
Implantable Lead Implant Date: 20190329
Implantable Lead Implant Date: 20190329
Implantable Lead Location: 753859
Implantable Lead Location: 753860
Implantable Pulse Generator Implant Date: 20190329
Lead Channel Impedance Value: 650 Ohm
Lead Channel Pacing Threshold Amplitude: 0.75 V
Lead Channel Pacing Threshold Pulse Width: 0.5 ms
Lead Channel Sensing Intrinsic Amplitude: 12 mV
Lead Channel Setting Pacing Amplitude: 2.5 V
Lead Channel Setting Pacing Pulse Width: 0.5 ms
Lead Channel Setting Sensing Sensitivity: 2 mV
Pulse Gen Model: 2272
Pulse Gen Serial Number: 9003317

## 2022-02-11 ENCOUNTER — Ambulatory Visit: Payer: Medicare Other

## 2022-02-11 ENCOUNTER — Other Ambulatory Visit: Payer: Medicare Other

## 2022-02-11 ENCOUNTER — Other Ambulatory Visit: Payer: Self-pay

## 2022-02-11 ENCOUNTER — Inpatient Hospital Stay: Payer: Medicare Other

## 2022-02-11 ENCOUNTER — Inpatient Hospital Stay: Payer: Medicare Other | Attending: Internal Medicine | Admitting: Internal Medicine

## 2022-02-11 ENCOUNTER — Ambulatory Visit: Payer: Medicare Other | Admitting: Physician Assistant

## 2022-02-11 VITALS — BP 147/84 | HR 95 | Resp 17

## 2022-02-11 VITALS — BP 150/78 | HR 101 | Temp 97.9°F | Resp 16 | Wt 138.2 lb

## 2022-02-11 DIAGNOSIS — Z5112 Encounter for antineoplastic immunotherapy: Secondary | ICD-10-CM

## 2022-02-11 DIAGNOSIS — C3491 Malignant neoplasm of unspecified part of right bronchus or lung: Secondary | ICD-10-CM

## 2022-02-11 DIAGNOSIS — C3411 Malignant neoplasm of upper lobe, right bronchus or lung: Secondary | ICD-10-CM | POA: Insufficient documentation

## 2022-02-11 DIAGNOSIS — Z79899 Other long term (current) drug therapy: Secondary | ICD-10-CM | POA: Insufficient documentation

## 2022-02-11 DIAGNOSIS — Z95828 Presence of other vascular implants and grafts: Secondary | ICD-10-CM

## 2022-02-11 DIAGNOSIS — E032 Hypothyroidism due to medicaments and other exogenous substances: Secondary | ICD-10-CM

## 2022-02-11 DIAGNOSIS — C773 Secondary and unspecified malignant neoplasm of axilla and upper limb lymph nodes: Secondary | ICD-10-CM | POA: Diagnosis present

## 2022-02-11 LAB — CMP (CANCER CENTER ONLY)
ALT: 21 U/L (ref 0–44)
AST: 38 U/L (ref 15–41)
Albumin: 3.7 g/dL (ref 3.5–5.0)
Alkaline Phosphatase: 110 U/L (ref 38–126)
Anion gap: 5 (ref 5–15)
BUN: 11 mg/dL (ref 8–23)
CO2: 30 mmol/L (ref 22–32)
Calcium: 9.5 mg/dL (ref 8.9–10.3)
Chloride: 102 mmol/L (ref 98–111)
Creatinine: 0.58 mg/dL (ref 0.44–1.00)
GFR, Estimated: >60 mL/min (ref >60)
Glucose, Bld: 121 mg/dL — ABNORMAL HIGH (ref 70–99)
Potassium: 4.2 mmol/L (ref 3.5–5.1)
Sodium: 137 mmol/L (ref 135–145)
Total Bilirubin: 1.4 mg/dL — ABNORMAL HIGH (ref 0.3–1.2)
Total Protein: 7 g/dL (ref 6.5–8.1)

## 2022-02-11 LAB — CBC WITH DIFFERENTIAL (CANCER CENTER ONLY)
Abs Immature Granulocytes: 0.02 10*3/uL (ref 0.00–0.07)
Basophils Absolute: 0.1 10*3/uL (ref 0.0–0.1)
Basophils Relative: 1 %
Eosinophils Absolute: 0.1 10*3/uL (ref 0.0–0.5)
Eosinophils Relative: 2 %
HCT: 37.3 % (ref 36.0–46.0)
Hemoglobin: 13.5 g/dL (ref 12.0–15.0)
Immature Granulocytes: 0 %
Lymphocytes Relative: 9 %
Lymphs Abs: 0.6 10*3/uL — ABNORMAL LOW (ref 0.7–4.0)
MCH: 34.6 pg — ABNORMAL HIGH (ref 26.0–34.0)
MCHC: 36.2 g/dL — ABNORMAL HIGH (ref 30.0–36.0)
MCV: 95.6 fL (ref 80.0–100.0)
Monocytes Absolute: 0.8 10*3/uL (ref 0.1–1.0)
Monocytes Relative: 12 %
Neutro Abs: 5.1 10*3/uL (ref 1.7–7.7)
Neutrophils Relative %: 76 %
Platelet Count: 219 10*3/uL (ref 150–400)
RBC: 3.9 MIL/uL (ref 3.87–5.11)
RDW: 13.7 % (ref 11.5–15.5)
WBC Count: 6.7 10*3/uL (ref 4.0–10.5)
nRBC: 0 % (ref 0.0–0.2)

## 2022-02-11 LAB — TSH: TSH: 3.884 u[IU]/mL (ref 0.350–4.500)

## 2022-02-11 MED ORDER — HEPARIN SOD (PORK) LOCK FLUSH 100 UNIT/ML IV SOLN
500.0000 [IU] | Freq: Once | INTRAVENOUS | Status: AC | PRN
  Administered 2022-02-11: 500 [IU]

## 2022-02-11 MED ORDER — SODIUM CHLORIDE 0.9% FLUSH
10.0000 mL | INTRAVENOUS | Status: DC | PRN
  Administered 2022-02-11: 10 mL

## 2022-02-11 MED ORDER — SODIUM CHLORIDE 0.9 % IV SOLN: Freq: Once | INTRAVENOUS | Status: AC

## 2022-02-11 MED ORDER — SODIUM CHLORIDE 0.9 % IV SOLN
480.0000 mg | Freq: Once | INTRAVENOUS | Status: AC
  Administered 2022-02-11: 480 mg via INTRAVENOUS
  Filled 2022-02-11: qty 48

## 2022-02-11 MED ORDER — SODIUM CHLORIDE 0.9% FLUSH
10.0000 mL | INTRAVENOUS | Status: DC | PRN
  Administered 2022-02-11: 10 mL via INTRAVENOUS

## 2022-02-11 NOTE — Patient Instructions (Signed)
[facility]  Discharge Instructions: Thank you for choosing [facility] to provide your oncology and hematology care.   If you have a lab appointment with the [facility], please go directly to the [facility] and check in at the registration area.   Wear comfortable clothing and clothing appropriate for easy access to any Portacath or PICC line.   We strive to give you quality time with your provider. You may need to reschedule your appointment if you arrive late (15 or more minutes).  Arriving late affects you and other patients whose appointments are after yours.  Also, if you miss three or more appointments without notifying the office, you may be dismissed from the clinic at the provider's discretion.      For prescription refill requests, have your pharmacy contact our office and allow 72 hours for refills to be completed.    Today you received the following chemotherapy and/or immunotherapy agents: Opdivo      To help prevent nausea and vomiting after your treatment, we encourage you to take your nausea medication as directed.  BELOW ARE SYMPTOMS THAT SHOULD BE REPORTED IMMEDIATELY: *FEVER GREATER THAN 100.4 F (38 C) OR HIGHER *CHILLS OR SWEATING *NAUSEA AND VOMITING THAT IS NOT CONTROLLED WITH YOUR NAUSEA MEDICATION *UNUSUAL SHORTNESS OF BREATH *UNUSUAL BRUISING OR BLEEDING *URINARY PROBLEMS (pain or burning when urinating, or frequent urination) *BOWEL PROBLEMS (unusual diarrhea, constipation, pain near the anus) TENDERNESS IN MOUTH AND THROAT WITH OR WITHOUT PRESENCE OF ULCERS (sore throat, sores in mouth, or a toothache) UNUSUAL RASH, SWELLING OR PAIN  UNUSUAL VAGINAL DISCHARGE OR ITCHING   Items with * indicate a potential emergency and should be followed up as soon as possible or go to the Emergency Department if any problems should occur.  Please show the CHEMOTHERAPY ALERT CARD or IMMUNOTHERAPY ALERT CARD at check-in to the  Emergency Department and triage nurse.  Should you have questions after your visit or need to cancel or reschedule your appointment, please contact [facility]  Dept: [phone]  and follow the prompts.  Office hours are 8:00 a.m. to 4:30 p.m. Monday - Friday. Please note that voicemails left after 4:00 p.m. may not be returned until the following business day.  We are closed weekends and major holidays. You have access to a nurse at all times for urgent questions. Please call the main number to the clinic Dept: [phone] and follow the prompts.   For any non-urgent questions, you may also contact your provider using MyChart. We now offer e-Visits for anyone 30 and older to request care online for non-urgent symptoms. For details visit [email or web address].   Also download the MyChart app! Go to the app store, search "MyChart", open the app, select [facility], and log in with your MyChart username and password.  Masks are optional in the cancer centers. If you would like for your care team to wear a mask while they are taking care of you, please let them know. For doctor visits, patients may have with them one support person who is at least 78 years old. At this time, visitors are not allowed in the infusion area.

## 2022-02-11 NOTE — Progress Notes (Signed)
[place] [phone]   [phone]  OFFICE PROGRESS NOTE  Harris, [name], [name] [address] [place] [address]  DIAGNOSIS: Metastatic non-small cell lung cancer, adenocarcinoma. This was initially diagnosed as stage IB (T2a, N0, M0) in March of 2014, status post right upper lobectomy with lymph node dissection but the patient has evidence for disease recurrence in the right axilla status post resection.  PRIOR THERAPY:  1) Status post right axillary lymph node biopsy on 05/01/2014 under the care of Dr. Roxan Hockey. 2) Systemic chemotherapy with carboplatin for AUC of 5 and Alimta 500 mg/M2 every 3 weeks. First cycle 05/31/2014. Status post 6 cycles. Last dose was given 09/22/2014. 3) palliative radiotherapy to the subcarinal lymph node under the care of Dr. Sondra Come. 4) Nivolumab 240 mg IV every 2 weeks. First dose 12/23/2016. Status post 6 cycles. 5) SBRT to the enlarging pulmonary nodules under the care of Dr. Sondra Come.  CURRENT THERAPY:  Nivolumab 480 mg IV every 4 weeks. First dose 03/17/2017. Status post 62 cycles.  INTERVAL HISTORY: Nancy Hodges 76 y.o. female returns to the clinic today for follow-up visit.  The patient is complaining of increasing fatigue and weakness today.  She mentions that she was sick few weeks ago and she was given prescription for doxycycline by her primary care physician.  She denied having any recent chest pain, shortness of breath, cough or hemoptysis.  She has no nausea, vomiting, diarrhea or constipation.  She has no headache or visual changes.  She has been tolerating her treatment with nivolumab fairly well.  She is here today for evaluation before starting cycle #63.  MEDICAL HISTORY: Past Medical History:  Diagnosis Date   Allergic rhinitis    Anxiety    Arthritis    Asthma    Atrial flutter ([place])    Cholelithiases 09/26/2015   Diverticulosis    Encounter for antineoplastic immunotherapy  12/09/2016   GERD (gastroesophageal reflux disease)    H/O hiatal hernia    History of blood transfusion    History of chemotherapy    History of kidney stones    History of radiation therapy    Right and left lung- 11/18/21-11/28/21- Dr. Gery Pray   Internal hemorrhoid    Lung cancer [facility])    a. Stage IB non-small cell carcinoma, s/p R VATS, wedge resection, RU lobectomy 10/2012.   Persistent atrial fibrillation (HCC)    a. anticoagulated with Xarelto   Pneumonia 06/2016   [facility]   Presence of permanent cardiac pacemaker    Pulmonary nodule    Tachycardia-bradycardia syndrome ([place])    a. 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    ALLERGIES:  is allergic to chlorpheniramine-dm, prednisone, rofecoxib, septra [sulfamethoxazole-trimethoprim], doxycycline monohydrate, fluticasone, molds & smuts, and penicillins.  MEDICATIONS:  Current Outpatient Medications  Medication Sig Dispense Refill   acetaminophen (TYLENOL) 500 MG tablet Take 500 mg by mouth 3 (three) times daily as needed for moderate pain or fever.      albuterol (PROVENTIL HFA;VENTOLIN HFA) 108 (90 BASE) MCG/ACT inhaler Inhale 2 puffs into the lungs every 6 (six) hours as needed for wheezing or shortness of breath.      azelastine (ASTELIN) 0.1 % nasal spray Place 1 spray into both nostrils 2 (two) times daily. Use in each nostril as directed     benzonatate (TESSALON) 200 MG capsule Take 1 capsule (200 mg total) by mouth 3 (three) times daily as needed for  cough. 20 capsule 0   calcium carbonate (TUMS - DOSED IN MG ELEMENTAL CALCIUM) 500 MG chewable tablet Chew 1 tablet by mouth 2 (two) times daily as needed for indigestion or heartburn.      diltiazem (CARTIA XT) 240 MG 24 hr capsule Take 1 capsule (240 mg total) by mouth daily. 90 capsule 3   dimenhyDRINATE (DRAMAMINE) 50 MG tablet Take 12.5 mg by mouth every 8 (eight) hours as needed for dizziness.     hydrocortisone 1 % lotion Apply 1 application topically 2  (two) times daily. 118 mL 0   ipratropium-albuterol (DUONEB) 0.5-2.5 (3) MG/3ML SOLN Take 3 mLs by nebulization every 6 (six) hours as needed (shortness of breath or wheezing).      levothyroxine (SYNTHROID) 50 MCG tablet TAKE 1 TABLET(50 MCG) BY MOUTH DAILY BEFORE AND BREAKFAST (Patient not taking: Reported on 10/28/2021) 90 tablet 0   lidocaine-prilocaine (EMLA) cream APPLY TOPICALLY TO TO THE AFFECTED AREA AS NEEDED 30 g 4   loratadine (CLARITIN) 10 MG tablet Take 10 mg by mouth daily.     meclizine (ANTIVERT) 12.5 MG tablet      OVER THE COUNTER MEDICATION Take 5 mLs by mouth daily as needed (cough). [employer]'s kids cough syrup     Polyvinyl Alcohol-Povidone (REFRESH OP) Place 1 drop into both eyes daily as needed (dry eyes).     XARELTO 20 MG TABS tablet TAKE 1 TABLET(20 MG) BY MOUTH DAILY WITH SUPPER 30 tablet 5   No current facility-administered medications for this visit.    SURGICAL HISTORY:  Past Surgical History:  Procedure Laterality Date   CARDIOVERSION N/A 02/19/2018   Procedure: CARDIOVERSION;  Surgeon: Sanda Klein, MD;  Location: [facility]OSCOPY;  Service: Cardiovascular;  Laterality: N/A;   CARDIOVERSION N/A 04/30/2018   Procedure: CARDIOVERSION;  Surgeon: Thayer Headings, MD;  Location: [facility] ENDOSCOPY;  Service: Cardiovascular;  Laterality: N/A;   CATARACT EXTRACTION W/PHACO Right 08/14/2016   Procedure: CATARACT EXTRACTION PHACO AND INTRAOCULAR LENS PLACEMENT RIGHT EYE CDE 10.53;  Surgeon: Tonny Branch, MD;  [facility];  Service: Ophthalmology;  Laterality: Right;  right   CATARACT EXTRACTION W/PHACO Left 09/25/2016   Procedure: CATARACT EXTRACTION PHACO AND INTRAOCULAR LENS PLACEMENT (IOC);  Surgeon: Tonny Branch, MD;  Location: [facility];  Service: Ophthalmology;  Laterality: Left;  left CDE 8.81   COLONOSCOPY     [facility]: cattered sigmoid diverticula, internal hemorrhoids, sessile polyp in ascending and mid transverse colon (tubular adenoma).    colonoscopy with polypectomy   2015   ESOPHAGOGASTRODUODENOSCOPY     [facility]: bile reflux in stomach, mild gastritis, hiatal hernia   ESOPHAGOGASTRODUODENOSCOPY N/A 01/31/2016   Procedure: ESOPHAGOGASTRODUODENOSCOPY (EGD);  Surgeon: [name], MD;  [facility];  Service: Endoscopy;  Laterality: N/A;  1200   IR FLUORO GUIDE PORT INSERTION RIGHT  06/30/2017   IR US GUIDE VASC ACCESS RIGHT  06/30/2017   LOBECTOMY Right 10/27/2012   Procedure: LOBECTOMY;  Surgeon: [name], MD;  Location: [facility];  Service: Thoracic;  Laterality: Right;   RIGHT UPPER LOBECTOMY & Node Dissection   LYMPH NODE BIOPSY Right 05/01/2014   Procedure: LYMPH NODE BIOPSY, Right Axillary;  Surgeon: [name], MD;  Location: [facility];  Service: Thoracic;  Laterality: Right;   PACEMAKER IMPLANT N/A 10/30/2017   SJM Assurity MRI PPM implanted by Dr [name] once leadless pacemaker battery depleted   PARTIAL HYSTERECTOMY     PERMANENT PACEMAKER INSERTION N/A 09/14/2012   Nanostim (SJM) leadless pacemaker (  LEADLESS II STUDY PATIENT) implanted by Dr [name], no longer functioning, device abandoned.   VIDEO ASSISTED THORACOSCOPY (VATS)/WEDGE RESECTION Right 10/27/2012   Procedure: VIDEO ASSISTED THORACOSCOPY (VATS),RGHT UPPER LOBE LUNG WEDGE RESECTION;  Surgeon: [name], MD;  Location: [facility];  Service: Thoracic;  Laterality: Right;    REVIEW OF SYSTEMS:  A comprehensive review of systems was negative except for: Constitutional: positive for fatigue   PHYSICAL EXAMINATION: General appearance: alert, cooperative, fatigued, and no distress Head: Normocephalic, without obvious abnormality, atraumatic Neck: no adenopathy, no JVD, supple, symmetrical, trachea midline, and thyroid not enlarged, symmetric, no tenderness/mass/nodules Lymph nodes: Cervical, supraclavicular, and axillary nodes normal. Resp: clear to auscultation bilaterally Back: symmetric, no curvature. ROM normal. No CVA tenderness. Cardio: regular rate and  rhythm, S1, S2 normal, no murmur, click, rub or gallop GI: soft, non-tender; bowel sounds normal; no masses,  no organomegaly Extremities: extremities normal, atraumatic, no cyanosis or edema  ECOG PERFORMANCE STATUS: 1 - Symptomatic but completely ambulatory  Blood pressure (!) 150/78, pulse (!) 101, temperature 97.9 F (36.6 C), temperature source Tympanic, resp. rate 16, weight 138 lb 4 oz (62.7 kg), SpO2 97 %.  LABORATORY DATA: Lab Results  Component Value Date   WBC 6.7 02/11/2022   HGB 13.5 02/11/2022   HCT 37.3 02/11/2022   MCV 95.6 02/11/2022   PLT 219 02/11/2022      Chemistry      Component Value Date/Time   NA 137 02/11/2022 1348   NA 139 08/05/2017 0902   K 4.2 02/11/2022 1348   K 4.1 08/05/2017 0902   CL 102 02/11/2022 1348   CO2 30 02/11/2022 1348   CO2 28 08/05/2017 0902   BUN 11 02/11/2022 1348   BUN 10.1 08/05/2017 0902   CREATININE 0.58 02/11/2022 1348   CREATININE 0.8 08/05/2017 0902   GLU 134 (H) 02/18/2017 1410      Component Value Date/Time   CALCIUM 9.5 02/11/2022 1348   CALCIUM 9.5 08/05/2017 0902   ALKPHOS 110 02/11/2022 1348   ALKPHOS 111 08/05/2017 0902   AST 38 02/11/2022 1348   AST 33 08/05/2017 0902   ALT 21 02/11/2022 1348   ALT 34 08/05/2017 0902   BILITOT 1.4 (H) 02/11/2022 1348   BILITOT 1.39 (H) 08/05/2017 0902       RADIOGRAPHIC STUDIES: CUP PACEART REMOTE DEVICE CHECK  Result Date: 01/30/2022 Scheduled remote reviewed. Normal device function.  Next remote 91 days. [place]   ASSESSMENT AND PLAN: This is a very pleasant 76 years old white female with metastatic non-small cell lung cancer, adenocarcinoma with no actionable mutations status post systemic chemotherapy with carboplatin and Alimta and she is currently undergoing treatment with second line immunotherapy with Nivolumab status post 6 cycles. This was followed by immunotherapy with Nivolumab 480 mg IV every 4 weeks status post 62 cycles. Her last CT scans showed multiple  new bilateral spiculated pulmonary nodules suspicious for metastatic disease.  The patient also mentions that she had recent chest congestion and cough.  Inflammatory process could not be completely excluded at this point. The patient had a PET scan that showed hypermetabolic primarily subpleural nodules in both lower lobes with a dominant nodule measuring 2.2 x 1.3 cm along the right lower lobe adjacent to the diaphragm with maximum SUV of 15.5.  Other nodules are not hypermetabolic but still suspicious and no evidence of metastatic disease to the neck, abdomen and pelvis or skeleton. The patient underwent SBRT to the enlarging pulmonary nodules under the care  of Dr. Sondra Come. The patient has been tolerating this treatment well with no concerning adverse effects. I recommended for her to proceed with cycle #63 today as planned. I will see her back for follow-up visit in 4 weeks for evaluation before the next cycle of her treatment. She was advised to call immediately if she has any other concerning issues in the interval. The patient voices understanding of current disease status and treatment options and is in agreement with the current care plan.  All questions were answered. The patient knows to call the clinic with any problems, questions or concerns. We can certainly see the patient much sooner if necessary.  Disclaimer: This note was dictated with voice recognition software. Similar sounding words can inadvertently be transcribed and may not be corrected upon review.

## 2022-02-18 NOTE — Progress Notes (Signed)
Remote pacemaker transmission.   

## 2022-02-24 ENCOUNTER — Other Ambulatory Visit: Payer: Self-pay

## 2022-03-07 ENCOUNTER — Ambulatory Visit (INDEPENDENT_AMBULATORY_CARE_PROVIDER_SITE_OTHER): Payer: Medicare Other | Admitting: Internal Medicine

## 2022-03-07 ENCOUNTER — Encounter: Payer: Self-pay | Admitting: Internal Medicine

## 2022-03-07 VITALS — BP 132/60 | HR 94 | Ht 62.0 in | Wt 136.4 lb

## 2022-03-07 DIAGNOSIS — I495 Sick sinus syndrome: Secondary | ICD-10-CM

## 2022-03-07 DIAGNOSIS — D6869 Other thrombophilia: Secondary | ICD-10-CM | POA: Diagnosis not present

## 2022-03-07 DIAGNOSIS — I4821 Permanent atrial fibrillation: Secondary | ICD-10-CM | POA: Diagnosis not present

## 2022-03-07 NOTE — Patient Instructions (Signed)
Medication Instructions:  Continue all current medications.  Labwork: none  Testing/Procedures: none  Follow-Up: 1 year   Any Other Special Instructions Will Be Listed Below (If Applicable).  If you need a refill on your cardiac medications before your next appointment, please call your pharmacy.  

## 2022-03-07 NOTE — Progress Notes (Signed)
PCP: [name], [name]   Primary EP:  Dr Lillette Boxer KARMINA Nancy Hodges is a 76 y.o. female who presents today for routine electrophysiology followup.  Since last being seen in our clinic, the patient reports doing very well.  Today, she denies symptoms of palpitations, chest pain, shortness of breath,  lower extremity edema, dizziness, presyncope, or syncope.  The patient is otherwise without complaint today.   Past Medical History:  Diagnosis Date   Allergic rhinitis    Anxiety    Arthritis    Asthma    Atrial flutter ([place])    Cholelithiases 09/26/2015   Diverticulosis    Encounter for antineoplastic immunotherapy 12/09/2016   GERD (gastroesophageal reflux disease)    H/O hiatal hernia    History of blood transfusion    History of chemotherapy    History of kidney stones    History of radiation therapy    Right and left lung- 11/18/21-11/28/21- Dr. Gery Pray   Internal hemorrhoid    Lung cancer [facility])    a. Stage IB non-small cell carcinoma, s/p R VATS, wedge resection, RU lobectomy 10/2012.   Persistent atrial fibrillation (HCC)    a. anticoagulated with Xarelto   Pneumonia 06/2016   [facility]   Presence of permanent cardiac pacemaker    Pulmonary nodule    Tachycardia-bradycardia syndrome ([place])    a. 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   Past Surgical History:  Procedure Laterality Date   CARDIOVERSION N/A 02/19/2018   Procedure: CARDIOVERSION;  Surgeon: Sanda Klein, MD;  Location: [facility] ENDOSCOPY;  Service: Cardiovascular;  Laterality: N/A;   CARDIOVERSION N/A 04/30/2018   Procedure: CARDIOVERSION;  Surgeon: Thayer Headings, MD;  Location: [facility] ENDOSCOPY;  Service: Cardiovascular;  Laterality: N/A;   CATARACT EXTRACTION W/PHACO Right 08/14/2016   Procedure: CATARACT EXTRACTION PHACO AND INTRAOCULAR LENS PLACEMENT RIGHT EYE CDE 10.53;  Surgeon: Tonny Branch, MD;  [facility];  Service: Ophthalmology;  Laterality: Right;  right   CATARACT EXTRACTION  W/PHACO Left 09/25/2016   Procedure: CATARACT EXTRACTION PHACO AND INTRAOCULAR LENS PLACEMENT (IOC);  Surgeon: Tonny Branch, MD;  [facility];  Service: Ophthalmology;  Laterality: Left;  left CDE 8.81   COLONOSCOPY     [facility]: cattered sigmoid diverticula, internal hemorrhoids, sessile polyp in ascending and mid transverse colon (tubular adenoma).    colonoscopy with polypectomy  2015   ESOPHAGOGASTRODUODENOSCOPY     [facility]: bile reflux in stomach, mild gastritis, hiatal hernia   ESOPHAGOGASTRODUODENOSCOPY N/A 01/31/2016   Procedure: ESOPHAGOGASTRODUODENOSCOPY (EGD);  Surgeon: [name], MD;  [facility];  Service: Endoscopy;  Laterality: N/A;  1200   IR FLUORO GUIDE PORT INSERTION RIGHT  06/30/2017   IR US GUIDE VASC ACCESS RIGHT  06/30/2017   LOBECTOMY Right 10/27/2012   Procedure: LOBECTOMY;  Surgeon: [name], MD;  Location: [facility];  Service: Thoracic;  Laterality: Right;   RIGHT UPPER LOBECTOMY & Node Dissection   LYMPH NODE BIOPSY Right 05/01/2014   Procedure: LYMPH NODE BIOPSY, Right Axillary;  Surgeon: [name], MD;  Location: [facility];  Service: Thoracic;  Laterality: Right;   PACEMAKER IMPLANT N/A 10/30/2017   SJM Assurity MRI PPM implanted by Dr [name] once leadless pacemaker battery depleted   PARTIAL HYSTERECTOMY     PERMANENT PACEMAKER INSERTION N/A 09/14/2012   Nanostim (SJM) leadless pacemaker (LEADLESS II STUDY PATIENT) implanted by Dr [name], no longer functioning, device abandoned.   VIDEO ASSISTED THORACOSCOPY (VATS)/WEDGE RESECTION Right 10/27/2012  Procedure: VIDEO ASSISTED THORACOSCOPY (VATS),RGHT UPPER LOBE LUNG WEDGE RESECTION;  Surgeon: [name], MD;  Location: [employer];  Service: Thoracic;  Laterality: Right;    ROS- all systems are reviewed and negative except as per HPI above  Current Outpatient Medications  Medication Sig Dispense Refill   acetaminophen (TYLENOL) 500 MG tablet Take 500 mg by mouth 3 (three)  times daily as needed for moderate pain or fever.      albuterol (PROVENTIL HFA;VENTOLIN HFA) 108 (90 BASE) MCG/ACT inhaler Inhale 2 puffs into the lungs every 6 (six) hours as needed for wheezing or shortness of breath.      azelastine (ASTELIN) 0.1 % nasal spray Place 1 spray into both nostrils 2 (two) times daily. Use in each nostril as directed     benzonatate (TESSALON) 200 MG capsule Take 1 capsule (200 mg total) by mouth 3 (three) times daily as needed for cough. 20 capsule 0   calcium carbonate (TUMS - DOSED IN MG ELEMENTAL CALCIUM) 500 MG chewable tablet Chew 1 tablet by mouth 2 (two) times daily as needed for indigestion or heartburn.      diltiazem (CARTIA XT) 240 MG 24 hr capsule Take 1 capsule (240 mg total) by mouth daily. 90 capsule 3   dimenhyDRINATE (DRAMAMINE) 50 MG tablet Take 12.5 mg by mouth every 8 (eight) hours as needed for dizziness.     hydrocortisone 1 % lotion Apply 1 application topically 2 (two) times daily. 118 mL 0   ipratropium-albuterol (DUONEB) 0.5-2.5 (3) MG/3ML SOLN Take 3 mLs by nebulization every 6 (six) hours as needed (shortness of breath or wheezing).      lidocaine-prilocaine (EMLA) cream APPLY TOPICALLY TO TO THE AFFECTED AREA AS NEEDED 30 g 4   loratadine (CLARITIN) 10 MG tablet Take 10 mg by mouth daily.     OVER THE COUNTER MEDICATION Take 5 mLs by mouth daily as needed (cough). [employer]'s kids cough syrup     Polyvinyl Alcohol-Povidone (REFRESH OP) Place 1 drop into both eyes daily as needed (dry eyes).     XARELTO 20 MG TABS tablet TAKE 1 TABLET(20 MG) BY MOUTH DAILY WITH SUPPER 30 tablet 5   No current facility-administered medications for this visit.    Physical Exam: Vitals:   03/07/22 0922  BP: 132/60  Pulse: 94  SpO2: 96%  Weight: 136 lb 6.4 oz (61.9 kg)  Height: 5\' 2"  (1.575 m)    GEN- The patient is well appearing, alert and oriented x 3 today.   Head- normocephalic, atraumatic Eyes-  Sclera clear, conjunctiva pink Ears- hearing  intact Oropharynx- clear Lungs- Clear to ausculation bilaterally, normal work of breathing Chest- pacemaker pocket is well healed Heart- Regular rate and rhythm, no murmurs, rubs or gallops, PMI not laterally displaced GI- soft, NT, ND, + BS Extremities- no clubbing, cyanosis, or edema  Pacemaker interrogation- reviewed in detail today,  See PACEART report  ekg tracing ordered today is personally reviewed and shows afib, narrow qrs  Assessment and Plan:  1. Symptomatic sinus bradycardia  Normal pacemaker function See Pace Art report Reduce lower pacing rate from 60 to 50 bpm. she is not device dependant today  2. Permanent afib Rate controlled She is on xarelto Chads2vasc score is 5. Labs 02/11/22 reviewed  Risks, benefits and potential toxicities for medications prescribed and/or refilled reviewed with patient today.   Return annually  [name] MD, [facility] 03/07/2022 9:49 AM

## 2022-03-11 ENCOUNTER — Other Ambulatory Visit: Payer: Self-pay

## 2022-03-11 ENCOUNTER — Inpatient Hospital Stay: Payer: Medicare Other | Attending: Internal Medicine | Admitting: Internal Medicine

## 2022-03-11 ENCOUNTER — Other Ambulatory Visit: Payer: Medicare Other

## 2022-03-11 ENCOUNTER — Encounter: Payer: Self-pay | Admitting: Internal Medicine

## 2022-03-11 ENCOUNTER — Inpatient Hospital Stay: Payer: Medicare Other

## 2022-03-11 VITALS — BP 143/57 | HR 89 | Temp 98.1°F | Resp 16 | Ht 62.0 in | Wt 135.8 lb

## 2022-03-11 DIAGNOSIS — C3491 Malignant neoplasm of unspecified part of right bronchus or lung: Secondary | ICD-10-CM

## 2022-03-11 DIAGNOSIS — C773 Secondary and unspecified malignant neoplasm of axilla and upper limb lymph nodes: Secondary | ICD-10-CM | POA: Insufficient documentation

## 2022-03-11 DIAGNOSIS — Z5112 Encounter for antineoplastic immunotherapy: Secondary | ICD-10-CM

## 2022-03-11 DIAGNOSIS — Z79899 Other long term (current) drug therapy: Secondary | ICD-10-CM | POA: Insufficient documentation

## 2022-03-11 DIAGNOSIS — C3411 Malignant neoplasm of upper lobe, right bronchus or lung: Secondary | ICD-10-CM | POA: Diagnosis present

## 2022-03-11 DIAGNOSIS — Z95828 Presence of other vascular implants and grafts: Secondary | ICD-10-CM

## 2022-03-11 DIAGNOSIS — E032 Hypothyroidism due to medicaments and other exogenous substances: Secondary | ICD-10-CM

## 2022-03-11 LAB — CMP (CANCER CENTER ONLY)
ALT: 17 U/L (ref 0–44)
AST: 27 U/L (ref 15–41)
Albumin: 3.5 g/dL (ref 3.5–5.0)
Alkaline Phosphatase: 130 U/L — ABNORMAL HIGH (ref 38–126)
Anion gap: 4 — ABNORMAL LOW (ref 5–15)
BUN: 10 mg/dL (ref 8–23)
CO2: 29 mmol/L (ref 22–32)
Calcium: 8.9 mg/dL (ref 8.9–10.3)
Chloride: 102 mmol/L (ref 98–111)
Creatinine: 0.56 mg/dL (ref 0.44–1.00)
GFR, Estimated: >60 mL/min (ref >60)
Glucose, Bld: 225 mg/dL — ABNORMAL HIGH (ref 70–99)
Potassium: 4.3 mmol/L (ref 3.5–5.1)
Sodium: 135 mmol/L (ref 135–145)
Total Bilirubin: 1 mg/dL (ref 0.3–1.2)
Total Protein: 7.1 g/dL (ref 6.5–8.1)

## 2022-03-11 LAB — CBC WITH DIFFERENTIAL (CANCER CENTER ONLY)
Abs Immature Granulocytes: 0.01 10*3/uL (ref 0.00–0.07)
Basophils Absolute: 0.1 10*3/uL (ref 0.0–0.1)
Basophils Relative: 1 %
Eosinophils Absolute: 0.1 10*3/uL (ref 0.0–0.5)
Eosinophils Relative: 1 %
HCT: 35.3 % — ABNORMAL LOW (ref 36.0–46.0)
Hemoglobin: 12.7 g/dL (ref 12.0–15.0)
Immature Granulocytes: 0 %
Lymphocytes Relative: 7 %
Lymphs Abs: 0.6 10*3/uL — ABNORMAL LOW (ref 0.7–4.0)
MCH: 34.8 pg — ABNORMAL HIGH (ref 26.0–34.0)
MCHC: 36 g/dL (ref 30.0–36.0)
MCV: 96.7 fL (ref 80.0–100.0)
Monocytes Absolute: 0.8 10*3/uL (ref 0.1–1.0)
Monocytes Relative: 11 %
Neutro Abs: 6 10*3/uL (ref 1.7–7.7)
Neutrophils Relative %: 80 %
Platelet Count: 260 10*3/uL (ref 150–400)
RBC: 3.65 MIL/uL — ABNORMAL LOW (ref 3.87–5.11)
RDW: 12.9 % (ref 11.5–15.5)
WBC Count: 7.6 10*3/uL (ref 4.0–10.5)
nRBC: 0 % (ref 0.0–0.2)

## 2022-03-11 LAB — TSH: TSH: 3.409 u[IU]/mL (ref 0.350–4.500)

## 2022-03-11 MED ORDER — SODIUM CHLORIDE 0.9 % IV SOLN
480.0000 mg | Freq: Once | INTRAVENOUS | Status: AC
  Administered 2022-03-11: 480 mg via INTRAVENOUS
  Filled 2022-03-11: qty 48

## 2022-03-11 MED ORDER — SODIUM CHLORIDE 0.9% FLUSH
10.0000 mL | INTRAVENOUS | Status: DC | PRN
  Administered 2022-03-11: 10 mL via INTRAVENOUS

## 2022-03-11 MED ORDER — SODIUM CHLORIDE 0.9% FLUSH
10.0000 mL | INTRAVENOUS | Status: DC | PRN
  Administered 2022-03-11: 10 mL

## 2022-03-11 MED ORDER — HEPARIN SOD (PORK) LOCK FLUSH 100 UNIT/ML IV SOLN
500.0000 [IU] | Freq: Once | INTRAVENOUS | Status: AC | PRN
  Administered 2022-03-11: 500 [IU]

## 2022-03-11 MED ORDER — SODIUM CHLORIDE 0.9 % IV SOLN: Freq: Once | INTRAVENOUS | Status: AC

## 2022-03-11 NOTE — Patient Instructions (Signed)
[facility] ONCOLOGY  Discharge Instructions: Thank you for choosing [facility] to provide your oncology and hematology care.   If you have a lab appointment with the [facility], please go directly to the [facility] and check in at the registration area.   Wear comfortable clothing and clothing appropriate for easy access to any Portacath or PICC line.   We strive to give you quality time with your provider. You may need to reschedule your appointment if you arrive late (15 or more minutes).  Arriving late affects you and other patients whose appointments are after yours.  Also, if you miss three or more appointments without notifying the office, you may be dismissed from the clinic at the provider's discretion.      For prescription refill requests, have your pharmacy contact our office and allow 72 hours for refills to be completed.    Today you received the following chemotherapy and/or immunotherapy agents: Opdivo      To help prevent nausea and vomiting after your treatment, we encourage you to take your nausea medication as directed.  BELOW ARE SYMPTOMS THAT SHOULD BE REPORTED IMMEDIATELY: *FEVER GREATER THAN 100.4 F (38 C) OR HIGHER *CHILLS OR SWEATING *NAUSEA AND VOMITING THAT IS NOT CONTROLLED WITH YOUR NAUSEA MEDICATION *UNUSUAL SHORTNESS OF BREATH *UNUSUAL BRUISING OR BLEEDING *URINARY PROBLEMS (pain or burning when urinating, or frequent urination) *BOWEL PROBLEMS (unusual diarrhea, constipation, pain near the anus) TENDERNESS IN MOUTH AND THROAT WITH OR WITHOUT PRESENCE OF ULCERS (sore throat, sores in mouth, or a toothache) UNUSUAL RASH, SWELLING OR PAIN  UNUSUAL VAGINAL DISCHARGE OR ITCHING   Items with * indicate a potential emergency and should be followed up as soon as possible or go to the Emergency Department if any problems should occur.  Please show the CHEMOTHERAPY ALERT CARD or IMMUNOTHERAPY ALERT CARD at check-in to the  Emergency Department and triage nurse.  Should you have questions after your visit or need to cancel or reschedule your appointment, please contact [facility]  Dept: [phone]  and follow the prompts.  Office hours are 8:00 a.m. to 4:30 p.m. Monday - Friday. Please note that voicemails left after 4:00 p.m. may not be returned until the following business day.  We are closed weekends and major holidays. You have access to a nurse at all times for urgent questions. Please call the main number to the clinic Dept: [phone] and follow the prompts.   For any non-urgent questions, you may also contact your provider using MyChart. We now offer e-Visits for anyone 58 and older to request care online for non-urgent symptoms. For details visit [email or web address].   Also download the MyChart app! Go to the app store, search "MyChart", open the app, select [facility], and log in with your MyChart username and password.  Masks are optional in the cancer centers. If you would like for your care team to wear a mask while they are taking care of you, please let them know. For doctor visits, patients may have with them one support Nancy Hodges who is at least 78 years old. At this time, visitors are not allowed in the infusion area.

## 2022-03-11 NOTE — Progress Notes (Signed)
[place] [phone]   [phone]  OFFICE PROGRESS NOTE  Harris, [name], [name] [address] [place] [address]  DIAGNOSIS: Metastatic non-small cell lung cancer, adenocarcinoma. This was initially diagnosed as stage IB (T2a, N0, M0) in March of 2014, status post right upper lobectomy with lymph node dissection but the patient has evidence for disease recurrence in the right axilla status post resection.  PRIOR THERAPY:  1) Status post right axillary lymph node biopsy on 05/01/2014 under the care of Dr. Roxan Hockey. 2) Systemic chemotherapy with carboplatin for AUC of 5 and Alimta 500 mg/M2 every 3 weeks. First cycle 05/31/2014. Status post 6 cycles. Last dose was given 09/22/2014. 3) palliative radiotherapy to the subcarinal lymph node under the care of Dr. Sondra Come. 4) Nivolumab 240 mg IV every 2 weeks. First dose 12/23/2016. Status post 6 cycles. 5) SBRT to the enlarging pulmonary nodules under the care of Dr. Sondra Come.  CURRENT THERAPY:  Nivolumab 480 mg IV every 4 weeks. First dose 03/17/2017. Status post 63 cycles.  INTERVAL HISTORY: Nancy Hodges 76 y.o. female returns to the clinic today for follow-up visit.  The patient is feeling fine with no concerning complaints except for intermittent pain in the right upper quadrant.  She denied having any shortness of breath, cough or hemoptysis.  She has no nausea, vomiting, diarrhea or constipation.  She has no headache or visual changes.  She denied having any weight loss or night sweats.  She continues to tolerate her treatment with nivolumab fairly well.  The patient is here today for evaluation before starting cycle #64.  MEDICAL HISTORY: Past Medical History:  Diagnosis Date   Allergic rhinitis    Anxiety    Arthritis    Asthma    Atrial flutter ([place])    Cholelithiases 09/26/2015   Diverticulosis    Encounter for antineoplastic immunotherapy 12/09/2016   GERD  (gastroesophageal reflux disease)    H/O hiatal hernia    History of blood transfusion    History of chemotherapy    History of kidney stones    History of radiation therapy    Right and left lung- 11/18/21-11/28/21- Dr. Gery Pray   Internal hemorrhoid    Lung cancer [facility])    a. Stage IB non-small cell carcinoma, s/p R VATS, wedge resection, RU lobectomy 10/2012.   Persistent atrial fibrillation ([facility])    a. anticoagulated with Xarelto   Pneumonia 06/2016   [facility]   Presence of permanent cardiac pacemaker    Pulmonary nodule    Tachycardia-bradycardia syndrome ([place])    a. 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    ALLERGIES:  is allergic to chlorpheniramine-dm, prednisone, rofecoxib, septra [sulfamethoxazole-trimethoprim], doxycycline monohydrate, fluticasone, molds & smuts, meclizine, and penicillins.  MEDICATIONS:  Current Outpatient Medications  Medication Sig Dispense Refill   acetaminophen (TYLENOL) 500 MG tablet Take 500 mg by mouth 3 (three) times daily as needed for moderate pain or fever.      albuterol (PROVENTIL HFA;VENTOLIN HFA) 108 (90 BASE) MCG/ACT inhaler Inhale 2 puffs into the lungs every 6 (six) hours as needed for wheezing or shortness of breath.      azelastine (ASTELIN) 0.1 % nasal spray Place 1 spray into both nostrils 2 (two) times daily. Use in each nostril as directed     benzonatate (TESSALON) 200 MG capsule Take 1 capsule (200 mg total) by mouth 3 (three) times daily as needed for cough. 20 capsule 0  calcium carbonate (TUMS - DOSED IN MG ELEMENTAL CALCIUM) 500 MG chewable tablet Chew 1 tablet by mouth 2 (two) times daily as needed for indigestion or heartburn.      diltiazem (CARTIA XT) 240 MG 24 hr capsule Take 1 capsule (240 mg total) by mouth daily. 90 capsule 3   dimenhyDRINATE (DRAMAMINE) 50 MG tablet Take 12.5 mg by mouth every 8 (eight) hours as needed for dizziness.     hydrocortisone 1 % lotion Apply 1 application topically 2 (two)  times daily. 118 mL 0   ipratropium-albuterol (DUONEB) 0.5-2.5 (3) MG/3ML SOLN Take 3 mLs by nebulization every 6 (six) hours as needed (shortness of breath or wheezing).      lidocaine-prilocaine (EMLA) cream APPLY TOPICALLY TO TO THE AFFECTED AREA AS NEEDED 30 g 4   loratadine (CLARITIN) 10 MG tablet Take 10 mg by mouth daily.     OVER THE COUNTER MEDICATION Take 5 mLs by mouth daily as needed (cough). [employer] kids cough syrup     Polyvinyl Alcohol-Povidone (REFRESH OP) Place 1 drop into both eyes daily as needed (dry eyes).     XARELTO 20 MG TABS tablet TAKE 1 TABLET(20 MG) BY MOUTH DAILY WITH SUPPER 30 tablet 5   No current facility-administered medications for this visit.    SURGICAL HISTORY:  Past Surgical History:  Procedure Laterality Date   CARDIOVERSION N/A 02/19/2018   Procedure: CARDIOVERSION;  Surgeon: Sanda Klein, MD;  Location: [facility]COPY;  Service: Cardiovascular;  Laterality: N/A;   CARDIOVERSION N/A 04/30/2018   Procedure: CARDIOVERSION;  Surgeon: Thayer Headings, MD;  Location: [facility];  Service: Cardiovascular;  Laterality: N/A;   CATARACT EXTRACTION W/PHACO Right 08/14/2016   Procedure: CATARACT EXTRACTION PHACO AND INTRAOCULAR LENS PLACEMENT RIGHT EYE CDE 10.53;  Surgeon: Tonny Branch, MD;  Location: [facility];  Service: Ophthalmology;  Laterality: Right;  right   CATARACT EXTRACTION W/PHACO Left 09/25/2016   Procedure: CATARACT EXTRACTION PHACO AND INTRAOCULAR LENS PLACEMENT (IOC);  Surgeon: Tonny Branch, MD;  Location: [facility];  Service: Ophthalmology;  Laterality: Left;  left CDE 8.81   COLONOSCOPY     [facility]: cattered sigmoid diverticula, internal hemorrhoids, sessile polyp in ascending and mid transverse colon (tubular adenoma).    colonoscopy with polypectomy  2015   ESOPHAGOGASTRODUODENOSCOPY     [facility]: bile reflux in stomach, mild gastritis, hiatal hernia   ESOPHAGOGASTRODUODENOSCOPY N/A 01/31/2016   Procedure: ESOPHAGOGASTRODUODENOSCOPY (EGD);   Surgeon: [name], MD;  Location: [facility];  Service: Endoscopy;  Laterality: N/A;  1200   IR FLUORO GUIDE PORT INSERTION RIGHT  06/30/2017   IR US GUIDE VASC ACCESS RIGHT  06/30/2017   LOBECTOMY Right 10/27/2012   Procedure: LOBECTOMY;  Surgeon: [name], MD;  Location: [place];  Service: Thoracic;  Laterality: Right;   RIGHT UPPER LOBECTOMY & Node Dissection   LYMPH NODE BIOPSY Right 05/01/2014   Procedure: LYMPH NODE BIOPSY, Right Axillary;  Surgeon: [name], MD;  Location: [place];  Service: Thoracic;  Laterality: Right;   PACEMAKER IMPLANT N/A 10/30/2017   SJM Assurity MRI PPM implanted by Dr [name] once leadless pacemaker battery depleted   PARTIAL HYSTERECTOMY     PERMANENT PACEMAKER INSERTION N/A 09/14/2012   Nanostim (SJM) leadless pacemaker (LEADLESS II STUDY PATIENT) implanted by Dr [name], no longer functioning, device abandoned.   VIDEO ASSISTED THORACOSCOPY (VATS)/WEDGE RESECTION Right 10/27/2012   Procedure: VIDEO ASSISTED THORACOSCOPY (VATS),RGHT UPPER LOBE LUNG WEDGE RESECTION;  Surgeon: [name], MD;  Location:  [facility] OR;  Service: Thoracic;  Laterality: Right;    REVIEW OF SYSTEMS:  A comprehensive review of systems was negative except for: Constitutional: positive for fatigue Respiratory: positive for cough   PHYSICAL EXAMINATION: General appearance: alert, cooperative, fatigued, and no distress Head: Normocephalic, without obvious abnormality, atraumatic Neck: no adenopathy, no JVD, supple, symmetrical, trachea midline, and thyroid not enlarged, symmetric, no tenderness/mass/nodules Lymph nodes: Cervical, supraclavicular, and axillary nodes normal. Resp: clear to auscultation bilaterally Back: symmetric, no curvature. ROM normal. No CVA tenderness. Cardio: regular rate and rhythm, S1, S2 normal, no murmur, click, rub or gallop GI: soft, non-tender; bowel sounds normal; no masses,  no organomegaly Extremities: extremities normal,  atraumatic, no cyanosis or edema  ECOG PERFORMANCE STATUS: 1 - Symptomatic but completely ambulatory  Blood pressure (!) 143/57, pulse 89, temperature 98.1 F (36.7 C), temperature source Oral, resp. rate 16, height 5\' 2"  (1.575 m), weight 135 lb 12.8 oz (61.6 kg), SpO2 96 %.  LABORATORY DATA: Lab Results  Component Value Date   WBC 7.6 03/11/2022   HGB 12.7 03/11/2022   HCT 35.3 (L) 03/11/2022   MCV 96.7 03/11/2022   PLT 260 03/11/2022      Chemistry      Component Value Date/Time   NA 135 03/11/2022 1052   NA 139 08/05/2017 0902   K 4.3 03/11/2022 1052   K 4.1 08/05/2017 0902   CL 102 03/11/2022 1052   CO2 29 03/11/2022 1052   CO2 28 08/05/2017 0902   BUN 10 03/11/2022 1052   BUN 10.1 08/05/2017 0902   CREATININE 0.56 03/11/2022 1052   CREATININE 0.8 08/05/2017 0902   GLU 134 (H) 02/18/2017 1410      Component Value Date/Time   CALCIUM 8.9 03/11/2022 1052   CALCIUM 9.5 08/05/2017 0902   ALKPHOS 130 (H) 03/11/2022 1052   ALKPHOS 111 08/05/2017 0902   AST 27 03/11/2022 1052   AST 33 08/05/2017 0902   ALT 17 03/11/2022 1052   ALT 34 08/05/2017 0902   BILITOT 1.0 03/11/2022 1052   BILITOT 1.39 (H) 08/05/2017 0902       RADIOGRAPHIC STUDIES: No results found.  ASSESSMENT AND PLAN: This is a very pleasant 76 years old white female with metastatic non-small cell lung cancer, adenocarcinoma with no actionable mutations status post systemic chemotherapy with carboplatin and Alimta and she is currently undergoing treatment with second line immunotherapy with Nivolumab status post 6 cycles. This was followed by immunotherapy with Nivolumab 480 mg IV every 4 weeks status post 63 cycles. Her last CT scans showed multiple new bilateral spiculated pulmonary nodules suspicious for metastatic disease.  The patient also mentions that she had recent chest congestion and cough.  Inflammatory process could not be completely excluded at this point. The patient had a PET scan that  showed hypermetabolic primarily subpleural nodules in both lower lobes with a dominant nodule measuring 2.2 x 1.3 cm along the right lower lobe adjacent to the diaphragm with maximum SUV of 15.5.  Other nodules are not hypermetabolic but still suspicious and no evidence of metastatic disease to the neck, abdomen and pelvis or skeleton. The patient underwent SBRT to the enlarging pulmonary nodules under the care of Dr. Sondra Come. The patient has been tolerating her treatment with nivolumab fairly well with no concerning adverse effects. I recommended for her to proceed with cycle #64 today as planned. I will see her back for follow-up visit in 4 weeks for evaluation before the next cycle of her treatment. Will  consider repeating her imaging studies after the next cycle of her treatment. The patient voices understanding of current disease status and treatment options and is in agreement with the current care plan.  All questions were answered. The patient knows to call the clinic with any problems, questions or concerns. We can certainly see the patient much sooner if necessary.  Disclaimer: This note was dictated with voice recognition software. Similar sounding words can inadvertently be transcribed and may not be corrected upon review.

## 2022-03-24 ENCOUNTER — Other Ambulatory Visit: Payer: Self-pay

## 2022-03-24 ENCOUNTER — Emergency Department (HOSPITAL_COMMUNITY): Payer: Medicare Other

## 2022-03-24 ENCOUNTER — Encounter (HOSPITAL_COMMUNITY): Payer: Self-pay

## 2022-03-24 ENCOUNTER — Emergency Department (HOSPITAL_COMMUNITY)
Admission: EM | Admit: 2022-03-24 | Discharge: 2022-03-24 | Disposition: A | Discharge status: Home / Self Care | Payer: Medicare Other | Attending: Emergency Medicine | Admitting: Emergency Medicine

## 2022-03-24 DIAGNOSIS — J45909 Unspecified asthma, uncomplicated: Secondary | ICD-10-CM | POA: Insufficient documentation

## 2022-03-24 DIAGNOSIS — Z20822 Contact with and (suspected) exposure to covid-19: Secondary | ICD-10-CM | POA: Insufficient documentation

## 2022-03-24 DIAGNOSIS — J189 Pneumonia, unspecified organism: Secondary | ICD-10-CM | POA: Diagnosis not present

## 2022-03-24 DIAGNOSIS — Z85118 Personal history of other malignant neoplasm of bronchus and lung: Secondary | ICD-10-CM | POA: Diagnosis not present

## 2022-03-24 DIAGNOSIS — Z7901 Long term (current) use of anticoagulants: Secondary | ICD-10-CM | POA: Diagnosis not present

## 2022-03-24 DIAGNOSIS — R4182 Altered mental status, unspecified: Secondary | ICD-10-CM | POA: Diagnosis present

## 2022-03-24 DIAGNOSIS — R110 Nausea: Secondary | ICD-10-CM

## 2022-03-24 LAB — URINALYSIS, ROUTINE W REFLEX MICROSCOPIC
Bacteria, UA: NONE SEEN
Bilirubin Urine: NEGATIVE
Glucose, UA: NEGATIVE mg/dL
Hgb urine dipstick: NEGATIVE
Ketones, ur: NEGATIVE mg/dL
Leukocytes,Ua: NEGATIVE
Nitrite: NEGATIVE
Protein, ur: 30 mg/dL — AB
Specific Gravity, Urine: 1.023 (ref 1.005–1.030)
pH: 6 (ref 5.0–8.0)

## 2022-03-24 LAB — COMPREHENSIVE METABOLIC PANEL
ALT: 23 U/L (ref 0–44)
AST: 40 U/L (ref 15–41)
Albumin: 3 g/dL — ABNORMAL LOW (ref 3.5–5.0)
Alkaline Phosphatase: 147 U/L — ABNORMAL HIGH (ref 38–126)
Anion gap: 9 (ref 5–15)
BUN: 11 mg/dL (ref 8–23)
CO2: 26 mmol/L (ref 22–32)
Calcium: 9 mg/dL (ref 8.9–10.3)
Chloride: 99 mmol/L (ref 98–111)
Creatinine, Ser: 0.59 mg/dL (ref 0.44–1.00)
GFR, Estimated: >60 mL/min (ref >60)
Glucose, Bld: 216 mg/dL — ABNORMAL HIGH (ref 70–99)
Potassium: 4.1 mmol/L (ref 3.5–5.1)
Sodium: 134 mmol/L — ABNORMAL LOW (ref 135–145)
Total Bilirubin: 1 mg/dL (ref 0.3–1.2)
Total Protein: 7.3 g/dL (ref 6.5–8.1)

## 2022-03-24 LAB — CBC WITH DIFFERENTIAL/PLATELET
Abs Immature Granulocytes: 0.03 10*3/uL (ref 0.00–0.07)
Basophils Absolute: 0.1 10*3/uL (ref 0.0–0.1)
Basophils Relative: 1 %
Eosinophils Absolute: 0.1 10*3/uL (ref 0.0–0.5)
Eosinophils Relative: 1 %
HCT: 37.8 % (ref 36.0–46.0)
Hemoglobin: 12.8 g/dL (ref 12.0–15.0)
Immature Granulocytes: 0 %
Lymphocytes Relative: 6 %
Lymphs Abs: 0.5 10*3/uL — ABNORMAL LOW (ref 0.7–4.0)
MCH: 33.4 pg (ref 26.0–34.0)
MCHC: 33.9 g/dL (ref 30.0–36.0)
MCV: 98.7 fL (ref 80.0–100.0)
Monocytes Absolute: 1 10*3/uL (ref 0.1–1.0)
Monocytes Relative: 11 %
Neutro Abs: 7.5 10*3/uL (ref 1.7–7.7)
Neutrophils Relative %: 81 %
Platelets: 303 10*3/uL (ref 150–400)
RBC: 3.83 MIL/uL — ABNORMAL LOW (ref 3.87–5.11)
RDW: 13.3 % (ref 11.5–15.5)
WBC: 9.2 10*3/uL (ref 4.0–10.5)
nRBC: 0 % (ref 0.0–0.2)

## 2022-03-24 LAB — RESP PANEL BY RT-PCR (FLU A&B, COVID) ARPGX2
Influenza A by PCR: NEGATIVE
Influenza B by PCR: NEGATIVE
SARS Coronavirus 2 by RT PCR: NEGATIVE

## 2022-03-24 LAB — LIPASE, BLOOD: Lipase: 38 U/L (ref 11–51)

## 2022-03-24 MED ORDER — CEFPODOXIME PROXETIL 200 MG PO TABS: 200.0000 mg | ORAL_TABLET | Freq: Two times a day (BID) | ORAL | 0 refills | Status: AC

## 2022-03-24 MED ORDER — ONDANSETRON 4 MG PO TBDP: 4.0000 mg | ORAL_TABLET | Freq: Three times a day (TID) | ORAL | 0 refills | Status: AC | PRN

## 2022-03-24 MED ORDER — AZITHROMYCIN 250 MG PO TABS: 250.0000 mg | ORAL_TABLET | Freq: Every day | ORAL | 0 refills | Status: DC

## 2022-03-24 NOTE — Discharge Instructions (Signed)
You came to the emergency department today to be evaluated for your cough and nausea.  The chest x-ray obtained showed that you have pneumonia to your middle right lung and both lower lobes.  Due to this she was started on the antibiotic azithromycin and cefpodoxime.  Please take this medication as prescribed.  Please follow-up with your primary care doctor for repeat evaluation make sure that your pneumonia is improving.  Additionally I have given you medication Zofran to take as needed for nausea and vomiting.  Get help right away if: Your shortness of breath becomes worse. Your chest pain increases. Your sickness becomes worse, especially if you are an older adult or have a weak immune system. You cough up blood.

## 2022-03-24 NOTE — ED Triage Notes (Signed)
Patient BIB by EMS from PCP with reports of AMS. EMS reports that per patient she has been altered for the past two weeks. Patient complaining of cough and nausea. Patient is alert to self, place. States that she was brought here because she's "no good." Currently receiving chemo for lung cancer. EDP in room at time of triage for MSE. Patient does report to him that she is [facility] with occasional chest pain.

## 2022-03-24 NOTE — ED Notes (Signed)
Water given  

## 2022-03-24 NOTE — ED Provider Notes (Signed)
[facility] EMERGENCY DEPARTMENT Provider Note   CSN: [id number] Arrival date & time: 03/24/22  1255     History  Chief Complaint  Patient presents with   Altered Mental Status    Nancy Hodges is a 76 y.o. female With a medical history of metastatic non-small cell lung cancer currently receiving chemotherapy  last infusion 03/11/2022, persistent atrial fibrillation anticoagulated with Xarelto, asthma, presence of permanent cardiac pacemaker, anxiety, GERD.   Brought to the emergency department by EMS with a chief complaint of altered mental status.  EMS reported that patient was at her primary care doctor's office today for cough and nausea.  Patient lives by herself and drove herself to the doctor's appointment..  Patient was sent to the emergency department for altered mental status.  Patient is found to be alert to person, place and year.  Patient incorrectly identifies the month as July.  Patient denies feeling confused at this time.  Patient's chief complaint is a cough.  States that she has had a cough over the last month.  Cough occasionally produces green mucus.  Patient does endorse some shortness of breath associated with her cough.  Additionally patient reports that she has been dealing with nausea but denies any at present.  Patient also reports that she has occasional night sweats.  Patient also reports that she has episodes of dizziness where he feels like the entire room is spinning.  Patient denies any dizziness at present.  Patient's daughter reports that she has not seen any altered mental status and patient recently.  Patient has had a decrease in her vocabulary occasional short term memory loss since sustaining a stroke last year.  Patient's daughter also reports that patient has had 30 pound weight loss over the last few weeks.  This has started since she received radiation therapy for her breast cancer.  Her oncologist is aware.   Altered Mental Status Presenting  symptoms: no confusion   Associated symptoms: nausea   Associated symptoms: no abdominal pain, no fever, no headaches, no light-headedness, no rash, no seizures, no vomiting and no weakness        Home Medications Prior to Admission medications   Medication Sig Start Date End Date Taking? Authorizing Provider  acetaminophen (TYLENOL) 500 MG tablet Take 500 mg by mouth 3 (three) times daily as needed for moderate pain or fever.     [provider]  albuterol (PROVENTIL HFA;VENTOLIN HFA) 108 (90 BASE) MCG/ACT inhaler Inhale 2 puffs into the lungs every 6 (six) hours as needed for wheezing or shortness of breath.     [provider]  azelastine (ASTELIN) 0.1 % nasal spray Place 1 spray into both nostrils 2 (two) times daily. Use in each nostril as directed    [provider]  benzonatate (TESSALON) 200 MG capsule Take 1 capsule (200 mg total) by mouth 3 (three) times daily as needed for cough. 10/22/21   Curt Bears, MD  calcium carbonate (TUMS - DOSED IN MG ELEMENTAL CALCIUM) 500 MG chewable tablet Chew 1 tablet by mouth 2 (two) times daily as needed for indigestion or heartburn.     [provider]  diltiazem (CARTIA XT) 240 MG 24 hr capsule Take 1 capsule (240 mg total) by mouth daily. 04/10/21   Allred, Jeneen Rinks, MD  dimenhyDRINATE (DRAMAMINE) 50 MG tablet Take 12.5 mg by mouth every 8 (eight) hours as needed for dizziness.    [provider]  hydrocortisone 1 % lotion Apply 1 application topically 2 (  two) times daily. 09/24/21   Curt Bears, MD  ipratropium-albuterol (DUONEB) 0.5-2.5 (3) MG/3ML SOLN Take 3 mLs by nebulization every 6 (six) hours as needed (shortness of breath or wheezing).     [provider]  lidocaine-prilocaine (EMLA) cream APPLY TOPICALLY TO TO THE AFFECTED AREA AS NEEDED 08/24/19   Curt Bears, MD  loratadine (CLARITIN) 10 MG tablet Take 10 mg by mouth daily.    [provider]  OVER THE COUNTER  MEDICATION Take 5 mLs by mouth daily as needed (cough). [employer] kids cough syrup    [provider]  Polyvinyl Alcohol-Povidone (REFRESH OP) Place 1 drop into both eyes daily as needed (dry eyes).    [provider]  XARELTO 20 MG TABS tablet TAKE 1 TABLET(20 MG) BY MOUTH DAILY WITH SUPPER 10/28/21   Allred, Jeneen Rinks, MD      Allergies    Chlorpheniramine-dm, Prednisone, Rofecoxib, Septra [sulfamethoxazole-trimethoprim], Doxycycline monohydrate, Fluticasone, Molds & smuts, Meclizine, and Penicillins    Review of Systems   Review of Systems  Constitutional:  Negative for chills and fever.  Eyes:  Negative for visual disturbance.  Respiratory:  Positive for cough and shortness of breath.   Cardiovascular:  Negative for chest pain.  Gastrointestinal:  Positive for nausea. Negative for abdominal distention, abdominal pain, anal bleeding, blood in stool, constipation, diarrhea, rectal pain and vomiting.  Genitourinary:  Negative for difficulty urinating, dysuria and hematuria.  Musculoskeletal:  Negative for back pain and neck pain.  Skin:  Negative for color change and rash.  Neurological:  Positive for dizziness. Negative for tremors, seizures, syncope, facial asymmetry, speech difficulty, weakness, light-headedness, numbness and headaches.  Psychiatric/Behavioral:  Negative for confusion.     Physical Exam Updated Vital Signs BP 138/60   Pulse 92   Temp 98.2 F (36.8 C) (Oral)   Resp 18   Ht 5' 2"  (1.575 m)   Wt 61.6 kg   SpO2 98%   BMI 24.84 kg/m  Physical Exam Vitals and nursing note reviewed.  Constitutional:      General: She is not in acute distress.    Appearance: She is not ill-appearing, toxic-appearing or diaphoretic.  Eyes:     General:        Right eye: No discharge.        Left eye: No discharge.     Extraocular Movements: Extraocular movements intact.     Conjunctiva/sclera: Conjunctivae normal.     Pupils: Pupils are equal, round, and reactive  to light.  Cardiovascular:     Rate and Rhythm: Normal rate.     Pulses:          Radial pulses are 2+ on the right side and 2+ on the left side.  Pulmonary:     Effort: Pulmonary effort is normal. No tachypnea, bradypnea or respiratory distress.     Breath sounds: Examination of the right-upper field reveals wheezing. Examination of the left-upper field reveals wheezing. Examination of the right-lower field reveals rhonchi. Wheezing and rhonchi present.     Comments: Minimal expiratory wheezing noted to bilateral upper lobes.  Rhonchi to right lower lobe Abdominal:     General: Abdomen is flat. There is no distension. There are no signs of injury.     Palpations: Abdomen is soft. There is no mass or pulsatile mass.     Tenderness: There is generalized abdominal tenderness.     Comments: Minimal tenderness to palpation throughout entire abdomen  Musculoskeletal:     Cervical back: Normal  range of motion and neck supple. No rigidity.  Skin:    General: Skin is warm and dry.  Neurological:     General: No focal deficit present.     Mental Status: She is alert.     GCS: GCS eye subscore is 4. GCS verbal subscore is 5. GCS motor subscore is 6.     Cranial Nerves: No cranial nerve deficit or facial asymmetry.     Sensory: Sensation is intact.     Motor: No weakness, tremor, seizure activity or pronator drift.     Coordination: Finger-Nose-Finger Test normal.     Comments: CN II-XII intact; performed in supine position, +5 strength to bilateral upper extremities, +5 strength to dorsiflexion and plantarflexion, patient able to lift both legs against gravity and hold each there without difficulty, sensation grossly intact to bilateral upper and lower extremities  Psychiatric:        Behavior: Behavior is cooperative.     ED Results / Procedures / Treatments   Labs (all labs ordered are listed, but only abnormal results are displayed) Labs Reviewed  COMPREHENSIVE METABOLIC PANEL -  Abnormal; Notable for the following components:      Result Value   Sodium 134 (*)    Glucose, Bld 216 (*)    Albumin 3.0 (*)    Alkaline Phosphatase 147 (*)    All other components within normal limits  CBC WITH DIFFERENTIAL/PLATELET - Abnormal; Notable for the following components:   RBC 3.83 (*)    Lymphs Abs 0.5 (*)    All other components within normal limits  RESP PANEL BY RT-PCR (FLU A&B, COVID) ARPGX2  URINE CULTURE  LIPASE, BLOOD  URINALYSIS, ROUTINE W REFLEX MICROSCOPIC    EKG None  Radiology CT Head Wo Contrast  Result Date: 03/24/2022 CLINICAL DATA:  Mental status change EXAM: CT HEAD WITHOUT CONTRAST TECHNIQUE: Contiguous axial images were obtained from the base of the skull through the vertex without intravenous contrast. RADIATION DOSE REDUCTION: This exam was performed according to the departmental dose-optimization program which includes automated exposure control, adjustment of the mA and/or kV according to patient size and/or use of iterative reconstruction technique. COMPARISON:  10/08/2021 head CT FINDINGS: Brain: No acute territorial infarction, hemorrhage, or intracranial mass is visualized. Small chronic infarcts involving the left frontal lobe and right cerebellum. Atrophy and chronic small vessel ischemic changes of the white matter. Stable ventricle size. Vascular: No hyperdense vessels.  No unexpected calcification Skull: Normal. Negative for fracture or focal lesion. Sinuses/Orbits: No acute finding. Other: None IMPRESSION: 1. No CT evidence for acute intracranial abnormality. 2. Atrophy and chronic small vessel ischemic changes of the white matter. Multiple small chronic infarcts. Electronically Signed   By: Donavan Foil M.D.   On: 03/24/2022 15:40   DG Chest Portable 1 View  Result Date: 03/24/2022 CLINICAL DATA:  Cough, altered mental status EXAM: PORTABLE CHEST 1 VIEW COMPARISON:  Previous chest radiographs done on 01/08/2018 and CT chest done on  01/09/2022 FINDINGS: Cardiac size is within normal limits. There are no signs of pulmonary edema. Pacemaker battery is seen in left infraclavicular region. Tip of right IJ chest port is seen at the junction of superior vena cava and right atrium. There are patchy infiltrates in right mid and both lower lung fields. There is blunting of right lateral CP angle. There is no pneumothorax. IMPRESSION: Infiltrates are seen in right mid and both lower lung fields suggesting atelectasis/pneumonia. Small right pleural effusion. Electronically Signed   By: Royston Cowper  Rathinasamy M.D.   On: 03/24/2022 13:38    Procedures Procedures    Medications Ordered in ED Medications - No data to display  ED Course/ Medical Decision Making/ A&P Clinical Course as of 03/24/22 1521  Mon Mar 24, 2022  1326 I attempted to contact patient's daughter Tresea Heine for additional information, patient's daughter did not answer.  No other contacts listed to obtain collateral information from. [PB]    Clinical Course User Index [PB] Loni Beckwith, PA-C                           Medical Decision Making Amount and/or Complexity of Data Reviewed Labs: ordered. Radiology: ordered.  Risk Prescription drug management.   Alert 76 year old female in no acute distress, nontoxic-appearing.  Presents to the emergency department with a chief complaint of cough, nausea, and altered mental status.  Information obtained from patient and patient's daughter.  I reviewed past medical records including previous provider notes, labs, and imaging.  Patient has medical history as outlined in HPI which complicates her care.  Patient is alert to person, place, and time.  Patient's daughter reports any recent altered mental status.  However will obtain noncontrast head CT, urinalysis, and basic lab work.  We will add on lipase due to patient's reports of nausea.  Will check chest x-ray due to patient's complaint of cough.  I personally  viewed and interpreted patient's lab results.  Pertinent findings include: -UA shows no signs of infection -No leukocytosis or anemia -Alk phos 147, appears baseline for patient  I personally viewed and interpreted patient's chest x-ray.  Agree with radiology interpretation of infiltrate seen in the right mid and both lower lung field suggesting atelectasis/pneumonia.  Small right pleural effusion.  I personally viewed and interpret patient's noncontrast head CT.  Imaging shows no acute intracranial abnormalities  Patient has curb 65 score of 1.  She is afebrile without leukocytosis.  We will start oral cefpodoxime and azithromycin for treatment of pneumonia.  Patient has allergy to penicillin however has received cephalosporins in the past without incident.  I discussed patient's antibiotic regiment with pharmacist even.  Patient will need to follow-up with PCP.  Additionally will prescribe patient with Zofran for reports of nausea.  Patient was able tolerate p.o. intake in the emergency department without difficulty.  Patient care discussed with attending physician Dr. Ashok Cordia  Based on patient's chief complaint, I considered admission might be necessary, however after reassuring ED workup feel patient is reasonable for discharge.  Discussed results, findings, treatment and follow up. Patient and patient's daughter advised of return precautions. Patient and patient's daughter verbalized understanding and agreed with plan.  Portions of this note were generated with Lobbyist. Dictation errors may occur despite best attempts at proofreading.           Final Clinical Impression(s) / ED Diagnoses Final diagnoses:  Community acquired pneumonia, unspecified laterality  Nausea    Rx / DC Orders ED Discharge Orders          Ordered    ondansetron (ZOFRAN-ODT) 4 MG disintegrating tablet  Every 8 hours PRN        03/24/22 1547    azithromycin (ZITHROMAX) 250 MG tablet   Daily        03/24/22 1547    cefpodoxime (VANTIN) 200 MG tablet  2 times daily        03/24/22 1547  Loni Beckwith, PA-C 03/24/22 1602    Lajean Saver, MD 03/25/22 [phone]

## 2022-03-25 LAB — URINE CULTURE: Culture: <10000 — AB

## 2022-03-27 ENCOUNTER — Telehealth: Payer: Self-pay | Admitting: Medical Oncology

## 2022-03-27 NOTE — Telephone Encounter (Addendum)
  FYI from dtr- 08/21-Pt confused - She only mowed half her lawn the other day because she started vomiting. She went to ED and started treatment for pneumonia. Pt is back at home. She told dtr she needs someone to mow her lawn now. A family member is checking on her everyday. Dtr is concerned about her mom ,but said " my mom is difficult to work with ".   Transportation -Dtr does not know who was providing her moms transportation and [place] does not know I told dtr that  pt is not on our transportation list, but Danae Chen or Darrick Meigs will f/u with dtr. Dtr  will check with [facility]  End of life-documents Helene Kelp wants Dr Julien Nordmann to consider DNR or MOST form .She is pts POA. Advanced Directive are in chart.  Next appt Sept 5th . 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 She will get access to [facility].

## 2022-03-28 ENCOUNTER — Other Ambulatory Visit: Payer: Self-pay | Admitting: Internal Medicine

## 2022-03-28 DIAGNOSIS — I4819 Other persistent atrial fibrillation: Secondary | ICD-10-CM

## 2022-03-28 NOTE — Telephone Encounter (Signed)
Xarelto 20mg  refill request received. Pt is 76 years old, weight-61.6kg, Crea-0.59 on 03/24/2022, last seen by Dr. [name] on 03/07/2022, Diagnosis-Aflutter, CrCl-76.61ml/min; Dose is appropriate based on dosing criteria. Will send in refill to requested pharmacy.

## 2022-04-02 ENCOUNTER — Other Ambulatory Visit: Payer: Self-pay

## 2022-04-02 ENCOUNTER — Inpatient Hospital Stay (HOSPITAL_COMMUNITY)
Admission: EM | Admit: 2022-04-02 | Discharge: 2022-04-05 | DRG: 194 | Disposition: A | Discharge status: Skilled Nursing Facility | Payer: Medicare Other | Attending: Family Medicine | Admitting: Family Medicine

## 2022-04-02 ENCOUNTER — Encounter (HOSPITAL_COMMUNITY): Payer: Self-pay | Admitting: *Deleted

## 2022-04-02 ENCOUNTER — Emergency Department (HOSPITAL_COMMUNITY): Payer: Medicare Other

## 2022-04-02 DIAGNOSIS — Z20822 Contact with and (suspected) exposure to covid-19: Secondary | ICD-10-CM | POA: Diagnosis present

## 2022-04-02 DIAGNOSIS — R531 Weakness: Secondary | ICD-10-CM | POA: Diagnosis not present

## 2022-04-02 DIAGNOSIS — K7460 Unspecified cirrhosis of liver: Secondary | ICD-10-CM | POA: Diagnosis present

## 2022-04-02 DIAGNOSIS — J45909 Unspecified asthma, uncomplicated: Secondary | ICD-10-CM | POA: Diagnosis present

## 2022-04-02 DIAGNOSIS — E441 Mild protein-calorie malnutrition: Secondary | ICD-10-CM | POA: Diagnosis present

## 2022-04-02 DIAGNOSIS — Z881 Allergy status to other antibiotic agents status: Secondary | ICD-10-CM | POA: Diagnosis not present

## 2022-04-02 DIAGNOSIS — I480 Paroxysmal atrial fibrillation: Secondary | ICD-10-CM | POA: Diagnosis not present

## 2022-04-02 DIAGNOSIS — C3490 Malignant neoplasm of unspecified part of unspecified bronchus or lung: Secondary | ICD-10-CM | POA: Diagnosis present

## 2022-04-02 DIAGNOSIS — Z91048 Other nonmedicinal substance allergy status: Secondary | ICD-10-CM

## 2022-04-02 DIAGNOSIS — J189 Pneumonia, unspecified organism: Principal | ICD-10-CM | POA: Diagnosis present

## 2022-04-02 DIAGNOSIS — Z7901 Long term (current) use of anticoagulants: Secondary | ICD-10-CM

## 2022-04-02 DIAGNOSIS — K219 Gastro-esophageal reflux disease without esophagitis: Secondary | ICD-10-CM | POA: Diagnosis present

## 2022-04-02 DIAGNOSIS — I69351 Hemiplegia and hemiparesis following cerebral infarction affecting right dominant side: Secondary | ICD-10-CM

## 2022-04-02 DIAGNOSIS — Z888 Allergy status to other drugs, medicaments and biological substances status: Secondary | ICD-10-CM

## 2022-04-02 DIAGNOSIS — E440 Moderate protein-calorie malnutrition: Secondary | ICD-10-CM | POA: Diagnosis not present

## 2022-04-02 DIAGNOSIS — R6510 Systemic inflammatory response syndrome (SIRS) of non-infectious origin without acute organ dysfunction: Secondary | ICD-10-CM | POA: Diagnosis not present

## 2022-04-02 DIAGNOSIS — Z880 Allergy status to penicillin: Secondary | ICD-10-CM

## 2022-04-02 DIAGNOSIS — Z882 Allergy status to sulfonamides status: Secondary | ICD-10-CM | POA: Diagnosis not present

## 2022-04-02 DIAGNOSIS — Z902 Acquired absence of lung [part of]: Secondary | ICD-10-CM

## 2022-04-02 DIAGNOSIS — Z8249 Family history of ischemic heart disease and other diseases of the circulatory system: Secondary | ICD-10-CM | POA: Diagnosis not present

## 2022-04-02 DIAGNOSIS — Z87891 Personal history of nicotine dependence: Secondary | ICD-10-CM | POA: Diagnosis not present

## 2022-04-02 DIAGNOSIS — Z801 Family history of malignant neoplasm of trachea, bronchus and lung: Secondary | ICD-10-CM | POA: Diagnosis not present

## 2022-04-02 DIAGNOSIS — E46 Unspecified protein-calorie malnutrition: Secondary | ICD-10-CM | POA: Diagnosis present

## 2022-04-02 DIAGNOSIS — Z6825 Body mass index (BMI) 25.0-25.9, adult: Secondary | ICD-10-CM

## 2022-04-02 DIAGNOSIS — Z923 Personal history of irradiation: Secondary | ICD-10-CM | POA: Diagnosis not present

## 2022-04-02 DIAGNOSIS — I495 Sick sinus syndrome: Secondary | ICD-10-CM | POA: Diagnosis present

## 2022-04-02 DIAGNOSIS — Z79899 Other long term (current) drug therapy: Secondary | ICD-10-CM

## 2022-04-02 DIAGNOSIS — Z950 Presence of cardiac pacemaker: Secondary | ICD-10-CM | POA: Diagnosis not present

## 2022-04-02 DIAGNOSIS — R55 Syncope and collapse: Secondary | ICD-10-CM

## 2022-04-02 DIAGNOSIS — Z9221 Personal history of antineoplastic chemotherapy: Secondary | ICD-10-CM | POA: Diagnosis not present

## 2022-04-02 DIAGNOSIS — F411 Generalized anxiety disorder: Secondary | ICD-10-CM | POA: Diagnosis present

## 2022-04-02 DIAGNOSIS — I4819 Other persistent atrial fibrillation: Secondary | ICD-10-CM

## 2022-04-02 LAB — COMPREHENSIVE METABOLIC PANEL
ALT: 27 U/L (ref 0–44)
AST: 40 U/L (ref 15–41)
Albumin: 2.6 g/dL — ABNORMAL LOW (ref 3.5–5.0)
Alkaline Phosphatase: 165 U/L — ABNORMAL HIGH (ref 38–126)
Anion gap: 7 (ref 5–15)
BUN: 12 mg/dL (ref 8–23)
CO2: 28 mmol/L (ref 22–32)
Calcium: 8.9 mg/dL (ref 8.9–10.3)
Chloride: 99 mmol/L (ref 98–111)
Creatinine, Ser: 0.56 mg/dL (ref 0.44–1.00)
GFR, Estimated: >60 mL/min (ref >60)
Glucose, Bld: 191 mg/dL — ABNORMAL HIGH (ref 70–99)
Potassium: 4.4 mmol/L (ref 3.5–5.1)
Sodium: 134 mmol/L — ABNORMAL LOW (ref 135–145)
Total Bilirubin: 1.3 mg/dL — ABNORMAL HIGH (ref 0.3–1.2)
Total Protein: 6.5 g/dL (ref 6.5–8.1)

## 2022-04-02 LAB — CBC WITH DIFFERENTIAL/PLATELET
Abs Immature Granulocytes: 0.03 10*3/uL (ref 0.00–0.07)
Basophils Absolute: 0.1 10*3/uL (ref 0.0–0.1)
Basophils Relative: 1 %
Eosinophils Absolute: 0 10*3/uL (ref 0.0–0.5)
Eosinophils Relative: 0 %
HCT: 34.4 % — ABNORMAL LOW (ref 36.0–46.0)
Hemoglobin: 12 g/dL (ref 12.0–15.0)
Immature Granulocytes: 0 %
Lymphocytes Relative: 4 %
Lymphs Abs: 0.5 10*3/uL — ABNORMAL LOW (ref 0.7–4.0)
MCH: 33.2 pg (ref 26.0–34.0)
MCHC: 34.9 g/dL (ref 30.0–36.0)
MCV: 95.3 fL (ref 80.0–100.0)
Monocytes Absolute: 1 10*3/uL (ref 0.1–1.0)
Monocytes Relative: 8 %
Neutro Abs: 10.4 10*3/uL — ABNORMAL HIGH (ref 1.7–7.7)
Neutrophils Relative %: 87 %
Platelets: 287 10*3/uL (ref 150–400)
RBC: 3.61 MIL/uL — ABNORMAL LOW (ref 3.87–5.11)
RDW: 13.5 % (ref 11.5–15.5)
WBC: 12 10*3/uL — ABNORMAL HIGH (ref 4.0–10.5)
nRBC: 0 % (ref 0.0–0.2)

## 2022-04-02 LAB — URINALYSIS, ROUTINE W REFLEX MICROSCOPIC
Bilirubin Urine: NEGATIVE
Glucose, UA: NEGATIVE mg/dL
Hgb urine dipstick: NEGATIVE
Ketones, ur: NEGATIVE mg/dL
Leukocytes,Ua: NEGATIVE
Nitrite: NEGATIVE
Protein, ur: NEGATIVE mg/dL
Specific Gravity, Urine: 1.009 (ref 1.005–1.030)
pH: 6 (ref 5.0–8.0)

## 2022-04-02 LAB — SARS CORONAVIRUS 2 BY RT PCR: SARS Coronavirus 2 by RT PCR: NEGATIVE

## 2022-04-02 LAB — LACTIC ACID, PLASMA
Lactic Acid, Venous: 1.5 mmol/L (ref 0.5–1.9)
Lactic Acid, Venous: 1 mmol/L (ref 0.5–1.9)

## 2022-04-02 LAB — TROPONIN I (HIGH SENSITIVITY): Troponin I (High Sensitivity): 5 ng/L (ref <18)

## 2022-04-02 MED ORDER — DILTIAZEM HCL ER COATED BEADS 240 MG PO CP24
240.0000 mg | ORAL_CAPSULE | Freq: Every day | ORAL | Status: DC
  Administered 2022-04-03 – 2022-04-05 (×3): 240 mg via ORAL
  Filled 2022-04-02 (×3): qty 1

## 2022-04-02 MED ORDER — IPRATROPIUM-ALBUTEROL 0.5-2.5 (3) MG/3ML IN SOLN
3.0000 mL | Freq: Four times a day (QID) | RESPIRATORY_TRACT | Status: DC | PRN
Start: 2022-04-02 — End: 2022-04-05
  Administered 2022-04-02 – 2022-04-04 (×4): 3 mL via RESPIRATORY_TRACT
  Filled 2022-04-02 (×4): qty 3

## 2022-04-02 MED ORDER — DIPHENHYDRAMINE HCL 25 MG PO CAPS
25.0000 mg | ORAL_CAPSULE | Freq: Three times a day (TID) | ORAL | Status: DC | PRN
Start: 2022-04-02 — End: 2022-04-05

## 2022-04-02 MED ORDER — LEVOFLOXACIN IN D5W 750 MG/150ML IV SOLN: 750.0000 mg | INTRAVENOUS | Status: DC

## 2022-04-02 MED ORDER — ENSURE ENLIVE PO LIQD
237.0000 mL | Freq: Two times a day (BID) | ORAL | Status: DC
  Administered 2022-04-03 – 2022-04-05 (×4): 237 mL via ORAL

## 2022-04-02 MED ORDER — SODIUM CHLORIDE 0.9 % IV BOLUS
1000.0000 mL | Freq: Once | INTRAVENOUS | Status: AC
  Administered 2022-04-02: 1000 mL via INTRAVENOUS

## 2022-04-02 MED ORDER — LEVOFLOXACIN IN D5W 750 MG/150ML IV SOLN
750.0000 mg | Freq: Once | INTRAVENOUS | Status: DC
Start: 2022-04-02 — End: 2022-04-02
  Administered 2022-04-02: 750 mg via INTRAVENOUS
  Filled 2022-04-02: qty 150

## 2022-04-02 MED ORDER — SODIUM CHLORIDE 0.9 % IV SOLN: INTRAVENOUS | Status: DC

## 2022-04-02 MED ORDER — SODIUM CHLORIDE 0.9 % IV SOLN
2.0000 g | Freq: Two times a day (BID) | INTRAVENOUS | Status: DC
  Administered 2022-04-02 – 2022-04-05 (×6): 2 g via INTRAVENOUS
  Filled 2022-04-02 (×6): qty 12.5

## 2022-04-02 MED ORDER — BENZONATATE 100 MG PO CAPS
200.0000 mg | ORAL_CAPSULE | Freq: Three times a day (TID) | ORAL | Status: DC | PRN
Start: 2022-04-02 — End: 2022-04-05
  Administered 2022-04-04 (×2): 200 mg via ORAL
  Filled 2022-04-02 (×2): qty 2

## 2022-04-02 MED ORDER — RIVAROXABAN 20 MG PO TABS
20.0000 mg | ORAL_TABLET | Freq: Every day | ORAL | Status: DC
  Administered 2022-04-02 – 2022-04-04 (×3): 20 mg via ORAL
  Filled 2022-04-02 (×3): qty 1

## 2022-04-02 NOTE — Progress Notes (Signed)
Ok to change Levaquin to Cefepime per Dr. Clearence Ped.   Onnie Boer, PharmD, BCIDP, AAHIVP, CPP Infectious Disease Pharmacist 04/02/2022 10:10 PM

## 2022-04-02 NOTE — ED Notes (Signed)
Dr. Melina Copa given EKG and notified of possible sepsis pt per triage criteria.

## 2022-04-02 NOTE — Assessment & Plan Note (Addendum)
-   Consult PT eval and treat -Encourage nutrient dense food intake

## 2022-04-02 NOTE — Progress Notes (Signed)
Gave patient PRN treatment since she has not had a breathing treatment all day.

## 2022-04-02 NOTE — Assessment & Plan Note (Signed)
-   Heart rate 111, respiratory rate 24, leukocytosis 12 -No evidence of endorgan damage -Chest x-ray shows right lower lobe infiltrate that is unchanged and left lower lobe consolidation is new -Negative for COVID -Continue Levaquin -Follow-up repeat lactic

## 2022-04-02 NOTE — ED Provider Notes (Signed)
[facility] EMERGENCY DEPARTMENT Provider Note   CSN: [id number] Arrival date & time: 04/02/22  1718     History  Chief Complaint  Patient presents with   Near Syncope    Nancy Hodges is a 75 y.o. female with a history including paroxysmal atrial fibrillation, tachybradycardia syndrome with pacemaker, history of right lung cancer currently in remission, hepatic cirrhosis, also reports history of vertigo, has just completed a course of Vantin and Zithromax due to pneumonia which was diagnosed on August 21, presenting with near syncope.  She reports having generalized weakness, decreased appetite since being diagnosed with pneumonia and today when sitting on the couch she became lightheaded, had a vague sensation of the room spinning became nauseated with emesis x1 and her vision grayed out but she denies completely passing out.  She continues to feel weak, denies dizziness at this time.  She continues to have a nonproductive cough, denies chest pain, shortness of breath, also denies nausea or abdominal pain at this time.  She denies recognizing any activation of her pacemaker.  The history is provided by the patient and a relative (Daughter at bedside).       Home Medications Prior to Admission medications   Medication Sig Start Date End Date Taking? Authorizing Provider  acetaminophen (TYLENOL) 500 MG tablet Take 500 mg by mouth 3 (three) times daily as needed for moderate pain or fever.    Yes [provider]  albuterol (PROVENTIL HFA;VENTOLIN HFA) 108 (90 BASE) MCG/ACT inhaler Inhale 2 puffs into the lungs every 6 (six) hours as needed for wheezing or shortness of breath.    Yes [provider]  azelastine (ASTELIN) 0.1 % nasal spray Place 1 spray into both nostrils 2 (two) times daily. Use in each nostril as directed   Yes [provider]  calcium carbonate (TUMS - DOSED IN MG ELEMENTAL CALCIUM) 500 MG chewable tablet Chew 1 tablet by mouth 2 (two)  times daily as needed for indigestion or heartburn.    Yes [provider]  CARTIA XT 240 MG 24 hr capsule TAKE 1 CAPSULE BY MOUTH DAILY 03/28/22  Yes Allred, Jeneen Rinks, MD  dimenhyDRINATE (DRAMAMINE) 50 MG tablet Take 12.5 mg by mouth every 8 (eight) hours as needed for dizziness.   Yes [provider]  ipratropium-albuterol (DUONEB) 0.5-2.5 (3) MG/3ML SOLN Take 3 mLs by nebulization every 6 (six) hours as needed (shortness of breath or wheezing).    Yes [provider]  lidocaine-prilocaine (EMLA) cream APPLY TOPICALLY TO TO THE AFFECTED AREA AS NEEDED Patient taking differently: Apply 1 Application topically as needed (for pain). 08/24/19  Yes Curt Bears, MD  ondansetron (ZOFRAN-ODT) 4 MG disintegrating tablet Take 1 tablet (4 mg total) by mouth every 8 (eight) hours as needed for nausea or vomiting. 03/24/22  Yes Badalamente, Rudell Cobb, PA-C  Polyvinyl Alcohol-Povidone (REFRESH OP) Place 1 drop into both eyes daily as needed (dry eyes).   Yes [provider]  XARELTO 20 MG TABS tablet TAKE 1 TABLET(20 MG) BY MOUTH DAILY WITH SUPPER Patient taking differently: Take 20 mg by mouth daily with supper. 03/28/22  Yes Allred, Jeneen Rinks, MD  azithromycin (ZITHROMAX) 250 MG tablet Take 1 tablet (250 mg total) by mouth daily. Take first 2 tablets together, then 1 every day until finished. Patient not taking: Reported on 04/02/2022 03/24/22   Loni Beckwith, PA-C  benzonatate (TESSALON) 200 MG capsule Take 1 capsule (200 mg total) by mouth 3 (three) times daily as needed for  cough. Patient not taking: Reported on 04/02/2022 10/22/21   Curt Bears, MD      Allergies    Chlorpheniramine-dm, Prednisone, Rofecoxib, Septra [sulfamethoxazole-trimethoprim], Doxycycline monohydrate, Fluticasone, Molds & smuts, Meclizine, and Penicillins    Review of Systems   Review of Systems  Constitutional:  Positive for fatigue. Negative for fever.  HENT:  Negative for congestion and  sore throat.   Eyes: Negative.   Respiratory:  Positive for cough. Negative for chest tightness and shortness of breath.   Cardiovascular:  Negative for chest pain.  Gastrointestinal:  Positive for nausea and vomiting. Negative for abdominal pain.  Genitourinary: Negative.   Musculoskeletal:  Negative for arthralgias, joint swelling and neck pain.  Skin: Negative.  Negative for rash and wound.  Neurological:  Positive for dizziness, syncope and weakness. Negative for light-headedness, numbness and headaches.  Psychiatric/Behavioral: Negative.    All other systems reviewed and are negative.   Physical Exam Updated Vital Signs BP (!) 140/71 (BP Location: Left Arm)   Pulse (!) 111   Temp 98 F (36.7 C) (Oral)   Resp (!) 24   Ht 5\' 2"  (1.575 m)   Wt 61.6 kg   SpO2 94%   BMI 24.84 kg/m  Physical Exam Vitals and nursing note reviewed.  Constitutional:      Appearance: She is well-developed.  HENT:     Head: Normocephalic and atraumatic.     Mouth/Throat:     Mouth: Mucous membranes are dry.  Eyes:     Conjunctiva/sclera: Conjunctivae normal.  Cardiovascular:     Rate and Rhythm: Tachycardia present. Rhythm irregular.     Heart sounds: Normal heart sounds.  Pulmonary:     Effort: Pulmonary effort is normal.     Breath sounds: Normal breath sounds. No wheezing.  Abdominal:     General: Bowel sounds are normal.     Palpations: Abdomen is soft.     Tenderness: There is no abdominal tenderness. There is no guarding.  Musculoskeletal:        General: Normal range of motion.     Cervical back: Normal range of motion.  Skin:    General: Skin is warm and dry.  Neurological:     General: No focal deficit present.     Mental Status: She is alert.     Cranial Nerves: No cranial nerve deficit.     Sensory: No sensory deficit.     Motor: No pronator drift. Weakness: equal grip strength, 4/5 bilaterally.    Coordination: Coordination normal. Heel to Shin Test normal.     ED  Results / Procedures / Treatments   Labs (all labs ordered are listed, but only abnormal results are displayed) Labs Reviewed  CBC WITH DIFFERENTIAL/PLATELET - Abnormal; Notable for the following components:      Result Value   WBC 12.0 (*)    RBC 3.61 (*)    HCT 34.4 (*)    Neutro Abs 10.4 (*)    Lymphs Abs 0.5 (*)    All other components within normal limits  COMPREHENSIVE METABOLIC PANEL - Abnormal; Notable for the following components:   Sodium 134 (*)    Glucose, Bld 191 (*)    Albumin 2.6 (*)    Alkaline Phosphatase 165 (*)    Total Bilirubin 1.3 (*)    All other components within normal limits  SARS CORONAVIRUS 2 BY RT PCR  CULTURE, BLOOD (ROUTINE X 2)  CULTURE, BLOOD (ROUTINE X 2)  URINALYSIS, ROUTINE W REFLEX MICROSCOPIC  LACTIC ACID, PLASMA  LACTIC ACID, PLASMA  TROPONIN I (HIGH SENSITIVITY)    EKG EKG Interpretation  Date/Time:  Wednesday April 02 2022 17:52:11 EDT Ventricular Rate:  98 PR Interval:    QRS Duration: 98 QT Interval:  366 QTC Calculation: 480 R Axis:   75 Text Interpretation: Atrial fibrillation Low voltage, precordial leads RSR' in V1 or V2, right VCD or RVH No significant change since prior 8/23 Confirmed by Aletta Edouard [phone]) on 04/02/2022 5:58:19 PM  Radiology DG Chest Portable 1 View  Result Date: 04/02/2022 CLINICAL DATA:  Syncope, recent pneumonia.  Lung cancer EXAM: PORTABLE CHEST 1 VIEW COMPARISON:  Chest 03/24/2022 FINDINGS: Patchy airspace disease right lung base unchanged. Small right effusion. Progressive consolidation in the left lung base. This is not present on prior chest CT of 01/09/2022 Port-A-Cath tip SVC.  Dual lead pacemaker unchanged. IMPRESSION: Right lower lobe infiltrate unchanged possible pneumonia. Progression of left lower lobe consolidation, possible pneumonia Electronically Signed   By: Franchot Gallo M.D.   On: 04/02/2022 19:00    Procedures Procedures    Medications Ordered in ED Medications   levofloxacin (LEVAQUIN) IVPB 750 mg (has no administration in time range)  sodium chloride 0.9 % bolus 1,000 mL (has no administration in time range)  sodium chloride 0.9 % bolus 1,000 mL (1,000 mLs Intravenous New Bag/Given 04/02/22 1909)    ED Course/ Medical Decision Making/ A&P Clinical Course as of 04/02/22 2117  Wed Apr 02, 4221  5027 76 year old female with recent ED visit for shortness of breath found to have pneumonia.  Treated with antibiotics although does not feel any improvement.  It sounds like a near syncopal event.  Is tachycardic hypertensive with elevated white count and chest x-ray showing possible worsening of bilateral infiltrates.  Will likely need admission.  [MB]    Clinical Course User Index [MB] Hayden Rasmussen, MD                           Medical Decision Making Patient presenting with a near syncopal event, persistent weakness and shortness of breath since being treated as an outpatient with right lower lobe pneumonia last week, she endorses having completed her antibiotics.  Imaging today suggest no change in the the right lower, now appears to have a developing left lower pneumonia.  Her COVID test is currently pending.  With a near syncopal event, pacemaker  interrogation is in process.  Patient will need admission for failure of outpatient CAP Treatment.  Amount and/or Complexity of Data Reviewed Labs: ordered.    Details: Mild leukocytosis with a WBC count of 12.0.  Glucose 191 Radiology: ordered and independent interpretation performed.    Details: Worsening bilateral pneumonia Discussion of management or test interpretation with external provider(s): Discussed with Dr. Clearence Ped who accepts pt for admission.  Risk Decision regarding hospitalization.           Final Clinical Impression(s) / ED Diagnoses Final diagnoses:  Pneumonia of both lower lobes due to infectious organism  Near syncope    Rx / DC Orders ED Discharge Orders      None         Landis Martins 04/02/22 2123    Hayden Rasmussen, MD 04/03/22 1004

## 2022-04-02 NOTE — ED Triage Notes (Addendum)
Pt c/o near syncopal episode that happened today. Pt reports she started to feel dizzy right before she passed out and then woke up vomiting. Pt reports she just finished her antibiotics for PNA.

## 2022-04-02 NOTE — Assessment & Plan Note (Addendum)
-   With pacer in place -Episode of dizziness, lightheadedness today -dc'd cardiac monitoring after 24 hours

## 2022-04-02 NOTE — Assessment & Plan Note (Signed)
-   Continue diltiazem and Xarelto

## 2022-04-02 NOTE — Assessment & Plan Note (Signed)
-   Albumin 2.6 -Reported poor p.o. intake at home -Encourage nutrient dense p.o. intake -Continue to monitor

## 2022-04-02 NOTE — Assessment & Plan Note (Addendum)
-   Chest x-ray shows persistent pneumonia -Treated with a full course of cefpodoxime and Zithromax outpatient -Unchanged right sided infiltrate, and new left-sided infiltrate -Blood cultures and sputum culture no growth to date  -Urine antigens for Legionella and strep pneumo (neg) -Levaquin started in the ED, but after discussing with pharmacy cefepime started.  -continue supportive measures

## 2022-04-02 NOTE — ED Notes (Signed)
Pacemaker interrogated. Single chamber. Placed for chronic afib. Has used it less than one percent in the last 4 years.

## 2022-04-03 DIAGNOSIS — J189 Pneumonia, unspecified organism: Secondary | ICD-10-CM | POA: Diagnosis not present

## 2022-04-03 DIAGNOSIS — R6510 Systemic inflammatory response syndrome (SIRS) of non-infectious origin without acute organ dysfunction: Secondary | ICD-10-CM

## 2022-04-03 DIAGNOSIS — R531 Weakness: Secondary | ICD-10-CM

## 2022-04-03 DIAGNOSIS — I480 Paroxysmal atrial fibrillation: Secondary | ICD-10-CM

## 2022-04-03 DIAGNOSIS — E440 Moderate protein-calorie malnutrition: Secondary | ICD-10-CM

## 2022-04-03 DIAGNOSIS — I495 Sick sinus syndrome: Secondary | ICD-10-CM

## 2022-04-03 LAB — STREP PNEUMONIAE URINARY ANTIGEN: Strep Pneumo Urinary Antigen: NEGATIVE

## 2022-04-03 LAB — COMPREHENSIVE METABOLIC PANEL
ALT: 20 U/L (ref 0–44)
AST: 29 U/L (ref 15–41)
Albumin: 2.1 g/dL — ABNORMAL LOW (ref 3.5–5.0)
Alkaline Phosphatase: 125 U/L (ref 38–126)
Anion gap: 7 (ref 5–15)
BUN: 8 mg/dL (ref 8–23)
CO2: 24 mmol/L (ref 22–32)
Calcium: 7.8 mg/dL — ABNORMAL LOW (ref 8.9–10.3)
Chloride: 105 mmol/L (ref 98–111)
Creatinine, Ser: 0.41 mg/dL — ABNORMAL LOW (ref 0.44–1.00)
GFR, Estimated: >60 mL/min (ref >60)
Glucose, Bld: 134 mg/dL — ABNORMAL HIGH (ref 70–99)
Potassium: 3.7 mmol/L (ref 3.5–5.1)
Sodium: 136 mmol/L (ref 135–145)
Total Bilirubin: 1 mg/dL (ref 0.3–1.2)
Total Protein: 5.4 g/dL — ABNORMAL LOW (ref 6.5–8.1)

## 2022-04-03 LAB — MAGNESIUM: Magnesium: 1.6 mg/dL — ABNORMAL LOW (ref 1.7–2.4)

## 2022-04-03 LAB — HIV ANTIBODY (ROUTINE TESTING W REFLEX): HIV Screen 4th Generation wRfx: NONREACTIVE

## 2022-04-03 MED ORDER — ACETAMINOPHEN 325 MG PO TABS: 650.0000 mg | ORAL_TABLET | Freq: Four times a day (QID) | ORAL | Status: DC | PRN

## 2022-04-03 MED ORDER — BUDESONIDE 0.25 MG/2ML IN SUSP
0.2500 mg | Freq: Two times a day (BID) | RESPIRATORY_TRACT | Status: DC
Start: 2022-04-03 — End: 2022-04-05
  Administered 2022-04-03 – 2022-04-05 (×5): 0.25 mg via RESPIRATORY_TRACT
  Filled 2022-04-03 (×5): qty 2

## 2022-04-03 MED ORDER — OXYCODONE HCL 5 MG PO TABS: 5.0000 mg | ORAL_TABLET | ORAL | Status: DC | PRN

## 2022-04-03 MED ORDER — ONDANSETRON HCL 4 MG/2ML IJ SOLN: 4.0000 mg | Freq: Four times a day (QID) | INTRAMUSCULAR | Status: DC | PRN

## 2022-04-03 MED ORDER — MAGNESIUM SULFATE 4 GM/100ML IV SOLN
4.0000 g | Freq: Once | INTRAVENOUS | Status: AC
  Administered 2022-04-03: 4 g via INTRAVENOUS
  Filled 2022-04-03: qty 100

## 2022-04-03 NOTE — Evaluation (Signed)
Physical Therapy Evaluation Patient Details Name: Nancy Hodges MRN: [id number] DOB: 06-06-1946 Today's Date: 04/03/2022  History of Present Illness  Nancy Hodges is a 76 y.o. female with medical history significant of atrial flutter, anxiety, GERD, lung cancer scheduled for next infusion in 6 days-infusions are once per month, pacemaker, and more presents to the ED with a chief complaint of nausea, vomiting, lightheadedness.  Patient reports the same set of symptoms that she had in her previous visit to the ER.  At that time she was diagnosed with pneumonia and she was sent home with cefpodoxime and azithromycin.  Today she reports she is experiencing generalized weakness.  Her daughter reports that patient has had significant weight loss over the last 1 month.  She had finished radiation and has been doing once a month infusions for her lung cancer which has left her without an appetite.  Patient only forces herself to eat when it is time to take her medications, and otherwise is not eating.  She reports she felt dizzy, and off balance.  She thought she might pass out but did not.  She had nausea and an episode of vomiting.  Patient reports that since she has finished her course of antibiotics, she has continued to have a cough that is productive of green sputum.  She is has experienced subjective fever, and chills at home.  She has episodes where she breaks out into a sweat as well.  Patient has dyspnea that is worse on exertion.  She does not have orthopnea.  Patient has no other acute complaints at this time.  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  Patient reports that she gets chest pains often, but they are relieved when she changes position.  These are not new.   Clinical Impression  Patient demonstrates labored movement for sitting up at bedside requiring  Min assist, very unsteady on feet when attempting ambulation without AD, required use of RW for safety demonstrating slow labored cadence and limited mostly due to fatigue and SOB.  Patient on room air with SpOT at 91% before walking, dropped to 88% after returning to chair and put back on 2 LPM with SpO2 at 91% - RN notified.  Patient will benefit from continued skilled physical therapy in hospital and recommended venue below to increase strength, balance, endurance for safe ADLs and gait.        Recommendations for follow up therapy are one component of a multi-disciplinary discharge planning process, led by the attending physician.  Recommendations may be updated based on patient status, additional functional criteria and insurance authorization.  Follow Up Recommendations Skilled nursing-short term rehab (<3 hours/day)      Assistance Recommended at Discharge Intermittent Supervision/Assistance  Patient can return home with the following  A lot of help with walking and/or transfers;A lot of help with bathing/dressing/bathroom;Help with stairs or ramp for entrance;Assistance with cooking/housework    Equipment Recommendations Rolling walker (2 wheels)  Recommendations for Other Services       Functional Status Assessment Patient has had a recent decline in their functional status and demonstrates the ability to make significant improvements in function in a reasonable and predictable amount of time.     Precautions / Restrictions Precautions Precautions: Fall Restrictions Weight Bearing Restrictions: No      Mobility  Bed Mobility Overal bed mobility: Needs Assistance Bed Mobility: Supine  to Sit     Supine to sit: Min assist     General bed mobility comments: increased time, labored movement    Transfers Overall transfer level: Needs assistance Equipment used: None, Rolling walker (2 wheels), 1 person hand held assist Transfers: Sit to/from Stand, Bed to  chair/wheelchair/BSC Sit to Stand: Min assist   Step pivot transfers: Min assist       General transfer comment: very unsteady on feet without AD, required use of RW for safety    Ambulation/Gait Ambulation/Gait assistance: Min assist, Mod assist Gait Distance (Feet): 20 Feet Assistive device: Rolling walker (2 wheels) Gait Pattern/deviations: Decreased step length - right, Decreased step length - left, Decreased stride length Gait velocity: slow     General Gait Details: slow labored unsteady cadence that gradually requied more assistance once fatigued and SOB  Stairs            Wheelchair Mobility    Modified Rankin (Stroke Patients Only)       Balance Overall balance assessment: Needs assistance Sitting-balance support: Feet supported, No upper extremity supported Sitting balance-Leahy Scale: Fair Sitting balance - Comments: fair/good seated at EOB   Standing balance support: During functional activity, No upper extremity supported Standing balance-Leahy Scale: Poor Standing balance comment: fair/poor using RW                             Pertinent Vitals/Pain Pain Assessment Pain Assessment: Faces Faces Pain Scale: Hurts little more Pain Location: low back, right lung Pain Descriptors / Indicators: Pressure, Squeezing Pain Intervention(s): Monitored during session, Limited activity within patient's tolerance    Home Living Family/patient expects to be discharged to:: Private residence Living Arrangements: Alone Available Help at Discharge: Family;Available PRN/intermittently Type of Home: Mobile home Home Access: Stairs to enter Entrance Stairs-Rails: Left Entrance Stairs-Number of Steps: 3   Home Layout: One level Home Equipment: None      Prior Function Prior Level of Function : Independent/Modified Independent             Mobility Comments: Hydrographic surveyor, drives ADLs Comments: Independent     Hand Dominance         Extremity/Trunk Assessment   Upper Extremity Assessment Upper Extremity Assessment: Generalized weakness    Lower Extremity Assessment Lower Extremity Assessment: Generalized weakness    Cervical / Trunk Assessment Cervical / Trunk Assessment: Normal  Communication   Communication: No difficulties  Cognition Arousal/Alertness: Awake/alert Behavior During Therapy: WFL for tasks assessed/performed Overall Cognitive Status: Within Functional Limits for tasks assessed                                          General Comments      Exercises     Assessment/Plan    PT Assessment Patient needs continued PT services  PT Problem List Decreased strength;Decreased activity tolerance;Decreased balance;Decreased mobility       PT Treatment Interventions DME instruction;Gait training;Stair training;Functional mobility training;Therapeutic activities;Therapeutic exercise;Patient/family education;Balance training    PT Goals (Current goals can be found in the Care Plan section)  Acute Rehab PT Goals Patient Stated Goal: return home with family to assist PT Goal Formulation: With patient Time For Goal Achievement: 04/17/22 Potential to Achieve Goals: Good    Frequency Min 3X/week     Co-evaluation  AM-PAC PT "6 Clicks" Mobility  Outcome Measure Help needed turning from your back to your side while in a flat bed without using bedrails?: None Help needed moving from lying on your back to sitting on the side of a flat bed without using bedrails?: A Little Help needed moving to and from a bed to a chair (including a wheelchair)?: A Little Help needed standing up from a chair using your arms (e.g., wheelchair or bedside chair)?: A Little Help needed to walk in hospital room?: A Lot Help needed climbing 3-5 steps with a railing? : A Lot 6 Click Score: 17    End of Session Equipment Utilized During Treatment: Oxygen Activity Tolerance: Patient  tolerated treatment well;Patient limited by fatigue Patient left: in chair;with call bell/phone within reach Nurse Communication: Mobility status PT Visit Diagnosis: Unsteadiness on feet (R26.81);Other abnormalities of gait and mobility (R26.89);Muscle weakness (generalized) (M62.81)    Time: 9379-0240 PT Time Calculation (min) (ACUTE ONLY): 32 min   Charges:   PT Evaluation $PT Eval Moderate Complexity: 1 Mod PT Treatments $Therapeutic Activity: 23-37 mins        11:57 AM, 04/03/22 Lonell Grandchild, MPT Physical Therapist with [facility] [phone] office [phone] mobile phone

## 2022-04-03 NOTE — Progress Notes (Signed)
ASSUMPTION OF CARE NOTE   04/03/2022 5:42 PM  Nancy Hodges was seen and examined.  The H&P by the admitting provider, orders, imaging was reviewed.  Please see new orders.  Will continue to follow.   Vitals:   04/03/22 0809 04/03/22 1300  BP:  (!) 127/56  Pulse:  90  Resp:  18  Temp:  98.1 F (36.7 C)  SpO2: 94% 95%    Results for orders placed or performed during the hospital encounter of 04/02/22  SARS Coronavirus 2 by RT PCR (hospital order, performed in [facility] hospital lab) *cepheid single result test* Anterior Nasal Swab   Specimen: Anterior Nasal Swab  Result Value Ref Range   SARS Coronavirus 2 by RT PCR NEGATIVE NEGATIVE  Blood culture (routine x 2)   Specimen: BLOOD LEFT HAND  Result Value Ref Range   Specimen Description BLOOD LEFT HAND    Special Requests      BOTTLES DRAWN AEROBIC AND ANAEROBIC Blood Culture adequate volume   Culture      NO GROWTH < 12 HOURS Performed at [facility], [address], [place], [place] [address]    Report Status PENDING   Blood culture (routine x 2)   Specimen: BLOOD LEFT HAND  Result Value Ref Range   Specimen Description BLOOD LEFT HAND    Special Requests      BOTTLES DRAWN AEROBIC AND ANAEROBIC Blood Culture adequate volume   Culture      NO GROWTH < 12 HOURS Performed at [facility], [address] [place], [place] [address]    Report Status PENDING   CBC with Differential  Result Value Ref Range   WBC 12.0 (H) 4.0 - 10.5 K/uL   RBC 3.61 (L) 3.87 - 5.11 MIL/uL   Hemoglobin 12.0 12.0 - 15.0 g/dL   HCT 34.4 (L) 36.0 - 46.0 %   MCV 95.3 80.0 - 100.0 fL   MCH 33.2 26.0 - 34.0 pg   MCHC 34.9 30.0 - 36.0 g/dL   RDW 13.5 11.5 - 15.5 %   Platelets 287 150 - 400 K/uL   nRBC 0.0 0.0 - 0.2 %   Neutrophils Relative % 87 %   Neutro Abs 10.4 (H) 1.7 - 7.7 K/uL   Lymphocytes Relative 4 %   Lymphs Abs 0.5 (L) 0.7 - 4.0 K/uL   Monocytes Relative 8 %   Monocytes Absolute 1.0 0.1 - 1.0 K/uL   Eosinophils Relative 0  %   Eosinophils Absolute 0.0 0.0 - 0.5 K/uL   Basophils Relative 1 %   Basophils Absolute 0.1 0.0 - 0.1 K/uL   Immature Granulocytes 0 %   Abs Immature Granulocytes 0.03 0.00 - 0.07 K/uL  Comprehensive metabolic panel  Result Value Ref Range   Sodium 134 (L) 135 - 145 mmol/L   Potassium 4.4 3.5 - 5.1 mmol/L   Chloride 99 98 - 111 mmol/L   CO2 28 22 - 32 mmol/L   Glucose, Bld 191 (H) 70 - 99 mg/dL   BUN 12 8 - 23 mg/dL   Creatinine, Ser 0.56 0.44 - 1.00 mg/dL   Calcium 8.9 8.9 - 10.3 mg/dL   Total Protein 6.5 6.5 - 8.1 g/dL   Albumin 2.6 (L) 3.5 - 5.0 g/dL   AST 40 15 - 41 U/L   ALT 27 0 - 44 U/L   Alkaline Phosphatase 165 (H) 38 - 126 U/L   Total Bilirubin 1.3 (H) 0.3 - 1.2 mg/dL   GFR, Estimated >60 >  60 mL/min   Anion gap 7 5 - 15  Urinalysis, Routine w reflex microscopic Anterior Nasal Swab  Result Value Ref Range   Color, Urine YELLOW YELLOW   APPearance CLEAR CLEAR   Specific Gravity, Urine 1.009 1.005 - 1.030   pH 6.0 5.0 - 8.0   Glucose, UA NEGATIVE NEGATIVE mg/dL   Hgb urine dipstick NEGATIVE NEGATIVE   Bilirubin Urine NEGATIVE NEGATIVE   Ketones, ur NEGATIVE NEGATIVE mg/dL   Protein, ur NEGATIVE NEGATIVE mg/dL   Nitrite NEGATIVE NEGATIVE   Leukocytes,Ua NEGATIVE NEGATIVE  Lactic acid, plasma  Result Value Ref Range   Lactic Acid, Venous 1.5 0.5 - 1.9 mmol/L  Lactic acid, plasma  Result Value Ref Range   Lactic Acid, Venous 1.0 0.5 - 1.9 mmol/L  HIV Antibody (routine testing w rflx)  Result Value Ref Range   HIV Screen 4th Generation wRfx Non Reactive Non Reactive  CBC with Differential/Platelet  Result Value Ref Range   WBC 9.0 4.0 - 10.5 K/uL   RBC 2.79 (L) 3.87 - 5.11 MIL/uL   Hemoglobin 9.1 (L) 12.0 - 15.0 g/dL   HCT 28.2 (L) 36.0 - 46.0 %   MCV 101.1 (H) 80.0 - 100.0 fL   MCH 32.6 26.0 - 34.0 pg   MCHC 32.3 30.0 - 36.0 g/dL   RDW 13.7 11.5 - 15.5 %   Platelets 209 150 - 400 K/uL   nRBC 0.0 0.0 - 0.2 %   Neutrophils Relative % 82 %   Neutro Abs  7.4 1.7 - 7.7 K/uL   Lymphocytes Relative 5 %   Lymphs Abs 0.4 (L) 0.7 - 4.0 K/uL   Monocytes Relative 11 %   Monocytes Absolute 1.0 0.1 - 1.0 K/uL   Eosinophils Relative 1 %   Eosinophils Absolute 0.1 0.0 - 0.5 K/uL   Basophils Relative 0 %   Basophils Absolute 0.0 0.0 - 0.1 K/uL   Immature Granulocytes 1 %   Abs Immature Granulocytes 0.05 0.00 - 0.07 K/uL  Strep pneumoniae urinary antigen  Result Value Ref Range   Strep Pneumo Urinary Antigen NEGATIVE NEGATIVE  Comprehensive metabolic panel  Result Value Ref Range   Sodium 136 135 - 145 mmol/L   Potassium 3.7 3.5 - 5.1 mmol/L   Chloride 105 98 - 111 mmol/L   CO2 24 22 - 32 mmol/L   Glucose, Bld 134 (H) 70 - 99 mg/dL   BUN 8 8 - 23 mg/dL   Creatinine, Ser 0.41 (L) 0.44 - 1.00 mg/dL   Calcium 7.8 (L) 8.9 - 10.3 mg/dL   Total Protein 5.4 (L) 6.5 - 8.1 g/dL   Albumin 2.1 (L) 3.5 - 5.0 g/dL   AST 29 15 - 41 U/L   ALT 20 0 - 44 U/L   Alkaline Phosphatase 125 38 - 126 U/L   Total Bilirubin 1.0 0.3 - 1.2 mg/dL   GFR, Estimated >60 >60 mL/min   Anion gap 7 5 - 15  Magnesium  Result Value Ref Range   Magnesium 1.6 (L) 1.7 - 2.4 mg/dL  Troponin I (High Sensitivity)  Result Value Ref Range   Troponin I (High Sensitivity) 5 <18 ng/L   *Note: Due to a large number of results and/or encounters for the requested time period, some results have not been displayed. A complete set of results can be found in Results Review.     Murvin Natal, MD [facility]   04/02/2022  5:34 PM How to contact the [facility] Attending or Consulting provider  7A - 7P or covering provider during after hours [place], for this patient?  Check the care team in [facility] and look for a) attending/consulting TRH provider listed and b) the [facility] team listed Log into [email or web address] and use [place] universal password to access. If you do not have the password, please contact the hospital operator. Locate the [facility] provider you are looking for under [facility] and  page to a number that you can be directly reached. If you still have difficulty reaching the provider, please page the [facility] (Director on Call) for the Hospitalists listed on amion for assistance.

## 2022-04-03 NOTE — NC FL2 (Signed)
[place] LEVEL OF CARE SCREENING TOOL     IDENTIFICATION  Patient Name: Nancy Hodges Birthdate: [birth year] Sex: female Admission Date (Current Location): 04/02/2022  [place] and [place] Number:  [place] [id number] [place] and Address:  [facility] [address] [place]      Provider Number: [id number]  Attending Physician Name and Address:  Murlean Iba, MD  Relative Name and Phone Number:  Nancy, Hodges (Daughter)   [phone]    Current Level of Care: Hospital Recommended Level of Care: [place] Prior Approval Number:    Date Approved/Denied:   PASRR Number: [id number]  Discharge Plan: SNF    Current Diagnoses: Patient Active Problem List   Diagnosis Date Noted   CAP (community acquired pneumonia) 04/02/2022   Protein calorie malnutrition ([place]) 04/02/2022   Persistent atrial fibrillation    Sick sinus syndrome ([place]) 10/30/2017   Port-A-Cath in place 07/07/2017   Seborrheic keratosis 05/12/2017   Nausea and vomiting 02/19/2017   Weakness 02/19/2017   SIRS (systemic inflammatory response syndrome) ([place]) 02/19/2017   Goals of care, counseling/discussion 12/09/2016   Encounter for antineoplastic immunotherapy 12/09/2016   Esophageal varices in other disease    Hepatic cirrhosis ([place]) 01/09/2016   Elevated LFTs 11/07/2015   Cholelithiases 09/26/2015   Melena    GI bleed 07/29/2014   Lung cancer, primary, with metastasis from lung to other site [facility]) 05/05/2014   Arthralgia 10/05/2013   Obstructive chronic bronchitis without exacerbation Gold A  02/11/2013   Non-small cell carcinoma of right lung, stage 4 ([place]) 09/29/2012   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   Paroxysmal atrial fibrillation (H[facility])     Orientation RESPIRATION BLADDER Height & Weight     Self, Time, Situation, Place  O2 Continent Weight: 137 lb 11.2 oz (62.5 kg) Height:  5\' 2"  (157.5 cm)  BEHAVIORAL  SYMPTOMS/MOOD NEUROLOGICAL BOWEL NUTRITION STATUS      Continent  (2)  AMBULATORY STATUS COMMUNICATION OF NEEDS Skin   Limited Assist Verbally Normal                       Personal Care Assistance Level of Assistance  Bathing, Feeding, Dressing Bathing Assistance: Limited assistance Feeding assistance: Independent Dressing Assistance: Limited assistance     Functional Limitations Info  Sight, Speech, Hearing Sight Info: Adequate Hearing Info: Adequate Speech Info: Adequate    SPECIAL CARE FACTORS FREQUENCY  PT (By licensed PT)     PT Frequency: 5x/week              Contractures Contractures Info: Not present    Additional Factors Info  Code Status, Allergies Code Status Info: Full Code Allergies Info: Chlorpheniramine dm, prednisone, rofecoxib, septra, doxycycline monohydrate, fluticasone, molds and smuts, meclizine, penicillins           Current Medications (04/03/2022):  This is the current hospital active medication list Current Facility-Administered Medications  Medication Dose Route Frequency Provider Last Rate Last Admin   0.9 %  sodium chloride infusion   Intravenous Continuous Zierle-Ghosh, Asia B, DO 75 mL/hr at 04/02/22 2349 New Bag at 04/02/22 2349   acetaminophen (TYLENOL) tablet 650 mg  650 mg Oral Q6H PRN Zierle-Ghosh, Asia B, DO       benzonatate (TESSALON) capsule 200 mg  200 mg Oral TID PRN Zierle-Ghosh, Asia B, DO       budesonide (PULMICORT) nebulizer solution 0.25 mg  0.25 mg Nebulization BID Zierle-Ghosh, Asia B, DO   0.25  mg at 04/03/22 0807   ceFEPIme (MAXIPIME) 2 g in sodium chloride 0.9 % 100 mL IVPB  2 g Intravenous Q12H Pham, Minh Q, RPH-CPP 200 mL/hr at 04/03/22 1457 2 g at 04/03/22 1457   diltiazem (CARDIZEM CD) 24 hr capsule 240 mg  240 mg Oral Daily Zierle-Ghosh, Asia B, DO   240 mg at 04/03/22 8768   diphenhydrAMINE (BENADRYL) capsule 25 mg  25 mg Oral Q8H PRN Zierle-Ghosh, Asia B, DO       feeding supplement (ENSURE ENLIVE /  ENSURE PLUS) liquid 237 mL  237 mL Oral BID BM Zierle-Ghosh, Asia B, DO   237 mL at 04/03/22 1157   ipratropium-albuterol (DUONEB) 0.5-2.5 (3) MG/3ML nebulizer solution 3 mL  3 mL Nebulization Q6H PRN Zierle-Ghosh, Asia B, DO   3 mL at 04/03/22 0401   ondansetron (ZOFRAN) injection 4 mg  4 mg Intravenous Q6H PRN Zierle-Ghosh, Asia B, DO       oxyCODONE (Oxy IR/ROXICODONE) immediate release tablet 5 mg  5 mg Oral Q4H PRN Zierle-Ghosh, Asia B, DO       rivaroxaban (XARELTO) tablet 20 mg  20 mg Oral Q supper Zierle-Ghosh, Asia B, DO   20 mg at 04/02/22 2345     Discharge Medications: Please see discharge summary for a list of discharge medications.  Relevant Imaging Results:  Relevant Lab Results:   Additional Information SSN [id number]. Per daughter patient is vaccinated and boostered for COVID  Levette Paulick, Clydene Pugh, LCSW

## 2022-04-03 NOTE — Plan of Care (Signed)
  Problem: Acute Rehab PT Goals(only PT should resolve) Goal: Pt Will Go Supine/Side To Sit Outcome: Progressing Flowsheets (Taken 04/03/2022 1158) Pt will go Supine/Side to Sit:  with modified independence  with supervision Goal: Patient Will Transfer Sit To/From Stand Outcome: Progressing Flowsheets (Taken 04/03/2022 1158) Patient will transfer sit to/from stand:  with min guard assist  with supervision Goal: Pt Will Transfer Bed To Chair/Chair To Bed Outcome: Progressing Flowsheets (Taken 04/03/2022 1158) Pt will Transfer Bed to Chair/Chair to Bed:  min guard assist  with supervision Goal: Pt Will Ambulate Outcome: Progressing Flowsheets (Taken 04/03/2022 1158) Pt will Ambulate:  50 feet  with supervision  with min guard assist  with rolling walker   11:58 AM, 04/03/22 Lonell Grandchild, MPT Physical Therapist with [facility] [phone] office [phone] mobile phone

## 2022-04-03 NOTE — H&P (Signed)
History and Physical    Patient: Nancy Hodges:[id number] DOB: April 16, 1946 DOA: 04/02/2022 DOS: the patient was seen and examined on 04/03/2022 PCP: [name], [name]  Patient coming from: Home  Chief Complaint:  Chief Complaint  Patient presents with   Near Syncope   HPI: Nancy Hodges is a 76 y.o. female with medical history significant of atrial flutter, anxiety, GERD, lung cancer scheduled for next infusion in 6 days-infusions are once per month, pacemaker, and more presents to the ED with a chief complaint of nausea, vomiting, lightheadedness.  Patient reports the same set of symptoms that she had in her previous visit to the ER.  At that time she was diagnosed with pneumonia and she was sent home with cefpodoxime and azithromycin.  Today she reports she is experiencing generalized weakness.  Her daughter reports that patient has had significant weight loss over the last 1 month.  She had finished radiation and has been doing once a month infusions for her lung cancer which has left her without an appetite.  Patient only forces herself to eat when it is time to take her medications, and otherwise is not eating.  She reports she felt dizzy, and off balance.  She thought she might pass out but did not.  She had nausea and an episode of vomiting.  Patient reports that since she has finished her course of antibiotics, she has continued to have a cough that is productive of green sputum.  She is has experienced subjective fever, and chills at home.  She has episodes where she breaks out into a sweat as well.  Patient has dyspnea that is worse on exertion.  She does not have orthopnea.  Patient has no other acute complaints at this time.  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  Patient reports that she gets chest pains often, but they are relieved when  she changes position.  These are not new.  Daughter would like to let us know that patient will require several refills at discharge.  She is scheduled to see her PCP but not until September 28, and she is low on several medications.  I discussed with her that this can be handled at discharge.  Patient does not smoke, does not drink, does not use illicit drugs.  She is vaccinated for COVID.  They report that patient has a living well, though not exactly sure what it says.  Right at this time, patient reports that she would want to be full code, but not left on life support for an extended period of time. Review of Systems: As mentioned in the history of present illness. All other systems reviewed and are negative. Past Medical History:  Diagnosis Date   Allergic rhinitis    Anxiety    Arthritis    Asthma    Atrial flutter ([place])    Cholelithiases 09/26/2015   Diverticulosis    Encounter for antineoplastic immunotherapy 12/09/2016   GERD (gastroesophageal reflux disease)    H/O hiatal hernia    History of blood transfusion    History of chemotherapy    History of kidney stones    History of radiation therapy    Right and left lung- 11/18/21-11/28/21- Dr. Gery Pray   Internal hemorrhoid    Lung cancer [facility])    a. Stage IB non-small cell carcinoma, s/p R VATS, wedge resection,  RU lobectomy 10/2012.   Persistent atrial fibrillation (HCC)    a. anticoagulated with Xarelto   Pneumonia 06/2016   [facility]   Presence of permanent cardiac pacemaker    Pulmonary nodule    Tachycardia-bradycardia syndrome ([place])    a. 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   Past Surgical History:  Procedure Laterality Date   CARDIOVERSION N/A 02/19/2018   Procedure: CARDIOVERSION;  Surgeon: Sanda Klein, MD;  Location: [facility] ENDOSCOPY;  Service: Cardiovascular;  Laterality: N/A;   CARDIOVERSION N/A 04/30/2018   Procedure: CARDIOVERSION;  Surgeon: Thayer Headings, MD;  Location: [facility] ENDOSCOPY;   Service: Cardiovascular;  Laterality: N/A;   CATARACT EXTRACTION W/PHACO Right 08/14/2016   Procedure: CATARACT EXTRACTION PHACO AND INTRAOCULAR LENS PLACEMENT RIGHT EYE CDE 10.53;  Surgeon: Tonny Branch, MD;  Location: [facility] ORS;  Service: Ophthalmology;  Laterality: Right;  right   CATARACT EXTRACTION W/PHACO Left 09/25/2016   Procedure: CATARACT EXTRACTION PHACO AND INTRAOCULAR LENS PLACEMENT (IOC);  Surgeon: Tonny Branch, MD;  Location: [facility] ORS;  Service: Ophthalmology;  Laterality: Left;  left CDE 8.81   COLONOSCOPY     [facility]: cattered sigmoid diverticula, internal hemorrhoids, sessile polyp in ascending and mid transverse colon (tubular adenoma).    colonoscopy with polypectomy  2015   ESOPHAGOGASTRODUODENOSCOPY     [facility]: bile reflux in stomach, mild gastritis, hiatal hernia   ESOPHAGOGASTRODUODENOSCOPY N/A 01/31/2016   Procedure: ESOPHAGOGASTRODUODENOSCOPY (EGD);  Surgeon: [name], MD;  Location[facility] END[facility];  Service: Endoscopy;  Laterality: N/A;  1200   IR FLUORO GUIDE PORT INSERTION RIGHT  06/30/2017   IR US GUIDE VASC ACCESS RIGHT  06/30/2017   LOBECTOMY Right 10/27/2012   Procedure: LOBECTOMY;  Surgeon: [name], MD;  Location: [place];  Service: Thoracic;  Laterality: Right;   RIGHT UPPER LOBECTOMY & Node Dissection   LYMPH NODE BIOPSY Right 05/01/2014   Procedure: LYMPH NODE BIOPSY, Right Axillary;  Surgeon: [name], MD;  Location: [place];  Service: Thoracic;  Laterality: Right;   PACEMAKER IMPLANT N/A 10/30/2017   SJM Assurity MRI PPM implanted by Dr [name] once leadless pacemaker battery depleted   PARTIAL HYSTERECTOMY     PERMANENT PACEMAKER INSERTION N/A 09/14/2012   Nanostim (SJM) leadless pacemaker (LEADLESS II STUDY PATIENT) implanted by Dr [name], no longer functioning, device abandoned.   VIDEO ASSISTED THORACOSCOPY (VATS)/WEDGE RESECTION Right 10/27/2012   Procedure: VIDEO ASSISTED THORACOSCOPY (VATS),RGHT UPPER LOBE LUNG WEDGE RESECTION;   Surgeon: [name], MD;  Location: [facility];  Service: Thoracic;  Laterality: Right;   Social History:  reports that she quit smoking about 26 years ago. Her smoking use included cigarettes. She has a 80.00 pack-year smoking history. She has never used smokeless tobacco. She reports that she does not drink alcohol and does not use drugs.  Allergies  Allergen Reactions   Chlorpheniramine-Dm Anaphylaxis and Other (See Comments)    Pt states BP dropped; developed irregular heartbeat    Prednisone Shortness Of Breath, Palpitations and Other (See Comments)    Double vision. History is not consistent with any allergy. "drove me crazy"   Rofecoxib Shortness Of Breath and Other (See Comments)    Stomach cramps Other reaction(s): shortness of breath   Septra [Sulfamethoxazole-Trimethoprim] Shortness Of Breath   Doxycycline Monohydrate Diarrhea, Nausea And Vomiting and Dermatitis   Fluticasone     Other reaction(s): dizziness   Molds & Smuts    Meclizine Rash   Penicillins Swelling, Rash and Other (See Comments)  DID THE REACTION INVOLVE: Swelling of the face/tongue/throat, SOB, or low BP? No Sudden or severe rash/hives, skin peeling, or the inside of the mouth or nose? Yes Did it require medical treatment? No  When did it last happen?      unknown If all above answers are "NO", may proceed with cephalosporin use.     Family History  Problem Relation Age of Onset   Heart disease Mother    Diabetes Mother    Kidney cancer Mother    Asthma Father    Heart disease Father    Pancreatic cancer Brother    Diabetes Sister    Lung cancer Sister    Diabetes Brother    Heart attack Son    Colon cancer Neg Hx    Breast cancer Neg Hx     Prior to Admission medications   Medication Sig Start Date End Date Taking? Authorizing Provider  acetaminophen (TYLENOL) 500 MG tablet Take 500 mg by mouth 3 (three) times daily as needed for moderate pain or fever.    Yes [provider]   albuterol (PROVENTIL HFA;VENTOLIN HFA) 108 (90 BASE) MCG/ACT inhaler Inhale 2 puffs into the lungs every 6 (six) hours as needed for wheezing or shortness of breath.    Yes [provider]  azelastine (ASTELIN) 0.1 % nasal spray Place 1 spray into both nostrils 2 (two) times daily. Use in each nostril as directed   Yes [provider]  calcium carbonate (TUMS - DOSED IN MG ELEMENTAL CALCIUM) 500 MG chewable tablet Chew 1 tablet by mouth 2 (two) times daily as needed for indigestion or heartburn.    Yes [provider]  CARTIA XT 240 MG 24 hr capsule TAKE 1 CAPSULE BY MOUTH DAILY 03/28/22  Yes Allred, Jeneen Rinks, MD  dimenhyDRINATE (DRAMAMINE) 50 MG tablet Take 12.5 mg by mouth every 8 (eight) hours as needed for dizziness.   Yes [provider]  ipratropium-albuterol (DUONEB) 0.5-2.5 (3) MG/3ML SOLN Take 3 mLs by nebulization every 6 (six) hours as needed (shortness of breath or wheezing).    Yes [provider]  lidocaine-prilocaine (EMLA) cream APPLY TOPICALLY TO TO THE AFFECTED AREA AS NEEDED Patient taking differently: Apply 1 Application topically as needed (for pain). 08/24/19  Yes Curt Bears, MD  ondansetron (ZOFRAN-ODT) 4 MG disintegrating tablet Take 1 tablet (4 mg total) by mouth every 8 (eight) hours as needed for nausea or vomiting. 03/24/22  Yes Badalamente, Rudell Cobb, PA-C  Polyvinyl Alcohol-Povidone (REFRESH OP) Place 1 drop into both eyes daily as needed (dry eyes).   Yes [provider]  XARELTO 20 MG TABS tablet TAKE 1 TABLET(20 MG) BY MOUTH DAILY WITH SUPPER Patient taking differently: Take 20 mg by mouth daily with supper. 03/28/22  Yes Allred, Jeneen Rinks, MD  azithromycin (ZITHROMAX) 250 MG tablet Take 1 tablet (250 mg total) by mouth daily. Take first 2 tablets together, then 1 every day until finished. Patient not taking: Reported on 04/02/2022 03/24/22   Loni Beckwith, PA-C  benzonatate (TESSALON) 200 MG capsule Take 1 capsule  (200 mg total) by mouth 3 (three) times daily as needed for cough. Patient not taking: Reported on 04/02/2022 10/22/21   Curt Bears, MD    Physical Exam: Vitals:   04/02/22 1746 04/02/22 2146 04/02/22 2220 04/02/22 2327  BP: (!) 140/71  123/74 (!) 133/59  Pulse: (!) 111   95  Resp: (!) 24   20  Temp: 98 F (36.7 C)  (!) 97.5 F (  36.4 C) 97.7 F (36.5 C)  TempSrc: Oral  Oral Oral  SpO2: 94% 94%  95%  Weight:      Height:       1.  General: Patient lying supine in bed,  no acute distress   2. Psychiatric: Alert and oriented x 3, mood and behavior normal for situation, pleasant and cooperative with exam   3. Neurologic: Speech and language are normal, face is symmetric, moves all 4 extremities voluntarily, at baseline without acute deficits on limited exam   4. HEENMT:  Head is atraumatic, normocephalic, pupils reactive to light, neck is supple, trachea is midline, mucous membranes are moist   5. Respiratory : Lungs are clear to auscultation bilaterally s/p breathing treatment, without wheezing, rhonchi, rales, no cyanosis, no increase in work of breathing or accessory muscle use   6. Cardiovascular : Heart rate normal, rhythm is regular, no murmurs, rubs or gallops, no peripheral edema, peripheral pulses palpated   7. Gastrointestinal:  Abdomen is soft, nondistended, nontender to palpation bowel sounds active, no masses or organomegaly palpated   8. Skin:  Skin is warm, dry and intact without rashes, acute lesions, or ulcers on limited exam   9.Musculoskeletal:  No acute deformities or trauma, no asymmetry in tone, no peripheral edema, peripheral pulses palpated, no tenderness to palpation in the extremities  Data Reviewed: In the ED Temp 98, heart rate 111, respiratory rate 24, blood pressure 140/71, satting at 94% Leukocytosis at 12.0 (secondary to pneumonia), hemoglobin 12.0 Chemistry reveals a glucose of 191, alk phos 165, albumin 2.6, T. bili 1.3 UA is not  indicative of UTI Chest x-ray shows right lower lobe infiltrate unchanged and left lower lobe consolidation that is new Negative COVID Patient failed outpatient treatment with cefpodoxime and Zithromax Levaquin initially started in the ED, but after discussing with pharmacy cefepime is a better option Admission requested for community-acquired pneumonia  Assessment and Plan: Protein calorie malnutrition (HCC) - Albumin 2.6 -Reported poor p.o. intake at home -Encourage nutrient dense p.o. intake -Continue to monitor  CAP (community acquired pneumonia) - Chest x-ray shows pneumonia -Treated with a full course of cefpodoxime and Zithromax outpatient -Unchanged right sided infiltrate, and new left-sided infiltrate -Blood cultures and sputum culture -Urine antigens for Legionella and strep -Levaquin started in the ED, but after discussing with pharmacy cefepime is a better option at this time.  Start cefepime -Patient does meet SIRS criteria with a leukocytosis of 12.0, heart rate 111, respiratory rate 24 -Currently no endorgan damage evidenced with a lactic acid 1.5, creatinine 0.56, no oxygen requirement, no altered mental status -Follow-up repeat lactic -1 L of fluids given -Continue to monitor  SIRS (systemic inflammatory response syndrome) (HCC) - Heart rate 111, respiratory rate 24, leukocytosis 12 -No evidence of endorgan damage -Chest x-ray shows right lower lobe infiltrate that is unchanged and left lower lobe consolidation is new -Negative for COVID -Continue Levaquin -Follow-up repeat lactic  Weakness - Consult PT eval and treat -Encourage nutrient dense food intake -Maintain euvolemia -Treat infection  Tachycardia-bradycardia syndrome (HCC) - With pacer in place -Episode of dizziness, lightheadedness today -Interrogation of pacemaker ordered and pending  Paroxysmal atrial fibrillation (HCC) - Continue diltiazem and Xarelto      Advance Care Planning:   Code  Status: Full Code   Consults: None  Family Communication: Daughter at bedside  Severity of Illness: The appropriate patient status for this patient is INPATIENT. Inpatient status is judged to be reasonable and necessary in order to  provide the required intensity of service to ensure the patient's safety. The patient's presenting symptoms, physical exam findings, and initial radiographic and laboratory data in the context of their chronic comorbidities is felt to place them at high risk for further clinical deterioration. Furthermore, it is not anticipated that the patient will be medically stable for discharge from the hospital within 2 midnights of admission.   * I certify that at the point of admission it is my clinical judgment that the patient will require inpatient hospital care spanning beyond 2 midnights from the point of admission due to high intensity of service, high risk for further deterioration and high frequency of surveillance required.*  Author: Rolla Plate, DO 04/03/2022 1:14 AM  For on call review [email or web address].

## 2022-04-03 NOTE — TOC Initial Note (Addendum)
Transition of Care [facility]) - Initial/Assessment Note    Patient Details  Name: Nancy Hodges MRN: [id number] Date of Birth: [birth year]  Transition of Care [facility]) CM/SW Contact:    Nancy Gully, Nancy Hodges Phone Number: 04/03/2022, 1:56 PM  Clinical Narrative:                 Update: Patient is agreeable to SNF. Referral made to facility of choice.  Patient from home alone. Independent at baseline. Has not driven in about a month. Discussed SNF vs HH with daughter, Nancy Hodges. Nancy Hodges states that patient should go to SNF but will likely choose HH. Nancy Hodges will speak with patient and discuss [facility] vs SNF and contact TOC back with decision. Patient has been vaccinated and received a booster for COVID.   Expected Discharge Plan: [place] Barriers to Discharge: Continued Medical Work up   Patient Goals and CMS Choice        Expected Discharge Plan and Services Expected Discharge Plan: [place]       Living arrangements for the past 2 months: Mobile Home                                      Prior Living Arrangements/Services Living arrangements for the past 2 months: Mobile Home Lives with:: Self Patient language and need for interpreter reviewed:: Yes Do you feel safe going back to the place where you live?: Yes      Need for Family Participation in Patient Care: Yes (Comment) Care giver support system in place?: No (comment)   Criminal Activity/Legal Involvement Pertinent to Current Situation/Hospitalization: No - Comment as needed  Activities of Daily Living Home Assistive Devices/Equipment: Eyeglasses ADL Screening (condition at time of admission) Patient's cognitive ability adequate to safely complete daily activities?: Yes Is the patient deaf or have difficulty hearing?: No Does the patient have difficulty seeing, even when wearing glasses/contacts?: No Does the patient have difficulty concentrating, remembering, or making  decisions?: No Patient able to express need for assistance with ADLs?: Yes Does the patient have difficulty dressing or bathing?: Yes Independently performs ADLs?: No Communication: Independent Dressing (OT): Needs assistance Is this a change from baseline?: Change from baseline, expected to last <3days Grooming: Needs assistance Is this a change from baseline?: Change from baseline, expected to last <3 days Feeding: Needs assistance Is this a change from baseline?: Change from baseline, expected to last <3 days Bathing: Needs assistance Is this a change from baseline?: Change from baseline, expected to last <3 days Toileting: Needs assistance Is this a change from baseline?: Change from baseline, expected to last <3 days In/Out Bed: Needs assistance Is this a change from baseline?: Change from baseline, expected to last <3 days Walks in Home: Needs assistance Is this a change from baseline?: Change from baseline, expected to last <3 days Does the patient have difficulty walking or climbing stairs?: Yes Weakness of Legs: Both Weakness of Arms/Hands: Both  Permission Sought/Granted Permission sought to share information with : Family Supports    Share Information with NAME: daughter, Nancy Hodges           Emotional Assessment       Orientation: : Oriented to Self, Oriented to Place, Oriented to Situation Alcohol / Substance Use: Not Applicable Psych Involvement: No (comment)  Admission diagnosis:  CAP (community acquired pneumonia) [J18.9] Near syncope [R55] Pneumonia of  both lower lobes due to infectious organism [J18.9] Patient Active Problem List   Diagnosis Date Noted   CAP (community acquired pneumonia) 04/02/2022   Protein calorie malnutrition ([place]) 04/02/2022   Persistent atrial fibrillation    Sick sinus syndrome ([place]) 10/30/2017   Port-A-Cath in place 07/07/2017   Seborrheic keratosis 05/12/2017   Nausea and vomiting 02/19/2017   Weakness 02/19/2017    SIRS (systemic inflammatory response syndrome) ([place]) 02/19/2017   Goals of care, counseling/discussion 12/09/2016   Encounter for antineoplastic immunotherapy 12/09/2016   Esophageal varices in other disease    Hepatic cirrhosis ([place]) 01/09/2016   Elevated LFTs 11/07/2015   Cholelithiases 09/26/2015   Melena    GI bleed 07/29/2014   Lung cancer, primary, with metastasis from lung to other site ([place]) 05/05/2014   Arthralgia 10/05/2013   Obstructive chronic bronchitis without exacerbation [place] [facility]  02/11/2013   Non-small cell carcinoma of right lung, stage 4 ([place]) 09/29/2012   Tachycardia-bradycardia syndrome ([place]) 09/06/2012   Paroxysmal atrial fibrillation ([place])    PCP:  [name], [name] Pharmacy:   [facility] [place], [address] AT [address] [place] [address] Phone: [phone] Fax: [phone]     Social Determinants of Health (SDOH) Interventions    Readmission Risk Interventions     No data to display

## 2022-04-04 ENCOUNTER — Telehealth: Payer: Self-pay | Admitting: Medical Oncology

## 2022-04-04 DIAGNOSIS — I480 Paroxysmal atrial fibrillation: Secondary | ICD-10-CM | POA: Diagnosis not present

## 2022-04-04 DIAGNOSIS — E440 Moderate protein-calorie malnutrition: Secondary | ICD-10-CM | POA: Diagnosis not present

## 2022-04-04 DIAGNOSIS — J189 Pneumonia, unspecified organism: Secondary | ICD-10-CM | POA: Diagnosis not present

## 2022-04-04 LAB — LEGIONELLA PNEUMOPHILA SEROGP 1 UR AG: L. pneumophila Serogp 1 Ur Ag: NEGATIVE

## 2022-04-04 LAB — MAGNESIUM: Magnesium: 2 mg/dL (ref 1.7–2.4)

## 2022-04-04 NOTE — Progress Notes (Signed)
   04/03/22 1957  Assess: MEWS Score  Temp 98.4 F (36.9 C)  BP (!) 160/58  MAP (mmHg) 89  Pulse Rate (!) 110  Resp (!) 23  Level of Consciousness Alert  SpO2 95 %  O2 Device Nasal Cannula  O2 Flow Rate (L/min) 2 L/min  Assess: MEWS Score  MEWS Temp 0  MEWS Systolic 0  MEWS Pulse 1  MEWS RR 1  MEWS LOC 0  MEWS Score 2  MEWS Score Color Yellow  Assess: if the MEWS score is Yellow or Red  Were vital signs taken at a resting state? Yes  Focused Assessment Change from prior assessment (see assessment flowsheet)  Does the patient meet 2 or more of the SIRS criteria? No  MEWS guidelines implemented *See Row Information* Yes  Treat  MEWS Interventions Administered scheduled meds/treatments  Pain Scale 0-10  Pain Score 0  Take Vital Signs  Increase Vital Sign Frequency  Yellow: Q 2hr X 2 then Q 4hr X 2, if remains yellow, continue Q 4hrs  Escalate  MEWS: Escalate Yellow: discuss with charge nurse/RN and consider discussing with provider and RRT  Notify: Charge Nurse/RN  Name of Charge Nurse/RN Notified K[place] RN  Date Charge Nurse/RN Notified 04/04/22  Time Charge Nurse/RN Notified 0030  Document  Progress note created (see row info) Yes  Assess: SIRS CRITERIA  SIRS Temperature  0  SIRS Pulse 1  SIRS Respirations  1  SIRS WBC 1  SIRS Score Sum  3

## 2022-04-04 NOTE — Assessment & Plan Note (Signed)
--   failed outpatient management, continue IV antibiotics

## 2022-04-04 NOTE — TOC Progression Note (Signed)
Transition of Care [facility]) - Progression Note    Patient Details  Name: Nancy Hodges MRN: [id number] Date of Birth: 12/10/1945  Transition of Care [facility]) CM/SW Contact  Ihor Gully, LCSW Phone Number: 04/04/2022, 3:07 PM  Clinical Narrative:    Bed offers provided to patient and daughter. Additional referrals made to requested facilities. Advised that patient could likely be medically stable for d/c Saturday.    Expected Discharge Plan: [place] Barriers to Discharge: Continued Medical Work up  Expected Discharge Plan and Services Expected Discharge Plan: [place] arrangements for the past 2 months: Mobile Home                                       Social Determinants of Health (SDOH) Interventions    Readmission Risk Interventions     No data to display

## 2022-04-04 NOTE — Progress Notes (Signed)
Physical Therapy Treatment Patient Details Name: Nancy Hodges MRN: [id number] DOB: 10/20/1945 Today's Date: 04/04/2022   History of Present Illness Nancy Hodges is a 76 y.o. female with medical history significant of atrial flutter, anxiety, GERD, lung cancer scheduled for next infusion in 6 days-infusions are once per month, pacemaker, and more presents to the ED with a chief complaint of nausea, vomiting, lightheadedness.  Patient reports the same set of symptoms that she had in her previous visit to the ER.  At that time she was diagnosed with pneumonia and she was sent home with cefpodoxime and azithromycin.  Today she reports she is experiencing generalized weakness.  Her daughter reports that patient has had significant weight loss over the last 1 month.  She had finished radiation and has been doing once a month infusions for her lung cancer which has left her without an appetite.  Patient only forces herself to eat when it is time to take her medications, and otherwise is not eating.  She reports she felt dizzy, and off balance.  She thought she might pass out but did not.  She had nausea and an episode of vomiting.  Patient reports that since she has finished her course of antibiotics, she has continued to have a cough that is productive of green sputum.  She is has experienced subjective fever, and chills at home.  She has episodes where she breaks out into a sweat as well.  Patient has dyspnea that is worse on exertion.  She does not have orthopnea.  Patient has no other acute complaints at this time.  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  Patient reports that she gets chest pains often, but they are relieved when she changes position.  These are not new.    PT Comments    Patient requires min assist to pull to seated EOB. She demonstrates good  sitting balance and sitting tolerance at EOB.She requires several attempts and momentum to transfer to standing with min g/a and use of RW. Patient able to ambulate increased distance today in room without loss of balance with use of RW. Patient returned to bed at end of session. Patient will benefit from continued skilled physical therapy in hospital and recommended venue below to increase strength, balance, endurance for safe ADLs and gait.    Recommendations for follow up therapy are one component of a multi-disciplinary discharge planning process, led by the attending physician.  Recommendations may be updated based on patient status, additional functional criteria and insurance authorization.  Follow Up Recommendations  Skilled nursing-short term rehab (<3 hours/day) Can patient physically be transported by private vehicle: Yes   Assistance Recommended at Discharge Intermittent Supervision/Assistance  Patient can return home with the following A lot of help with walking and/or transfers;A lot of help with bathing/dressing/bathroom;Help with stairs or ramp for entrance;Assistance with cooking/housework   Equipment Recommendations  Rolling walker (2 wheels)    Recommendations for Other Services       Precautions / Restrictions Precautions Precautions: Fall Restrictions Weight Bearing Restrictions: No     Mobility  Bed Mobility Overal bed mobility: Needs Assistance Bed Mobility: Supine to Sit     Supine to sit: Min assist     General bed mobility comments: increased time, labored movement    Transfers Overall transfer level: Needs assistance Equipment used: Rolling walker (2 wheels) Transfers: Sit  to/from Stand Sit to Stand: Min assist, Min guard           General transfer comment: transfer to standing with RW, requires several attempts with min g/a    Ambulation/Gait Ambulation/Gait assistance: Min guard, Min assist Gait Distance (Feet): 30 Feet Assistive  device: Rolling walker (2 wheels) Gait Pattern/deviations: Decreased step length - right, Decreased step length - left, Decreased stride length Gait velocity: decreased     General Gait Details: slow labored cadence with minimal unsteadiness   Stairs             Wheelchair Mobility    Modified Rankin (Stroke Patients Only)       Balance Overall balance assessment: Needs assistance Sitting-balance support: Feet supported, No upper extremity supported Sitting balance-Leahy Scale: Fair Sitting balance - Comments: fair/good seated at EOB   Standing balance support: During functional activity, No upper extremity supported Standing balance-Leahy Scale: Fair Standing balance comment: fair using RW                            Cognition Arousal/Alertness: Awake/alert Behavior During Therapy: WFL for tasks assessed/performed Overall Cognitive Status: Within Functional Limits for tasks assessed                                          Exercises      General Comments        Pertinent Vitals/Pain Pain Assessment Pain Assessment: No/denies pain    Home Living                          Prior Function            PT Goals (current goals can now be found in the care plan section) Acute Rehab PT Goals Patient Stated Goal: return home with family to assist PT Goal Formulation: With patient Time For Goal Achievement: 04/17/22 Potential to Achieve Goals: Good Progress towards PT goals: Progressing toward goals    Frequency    Min 3X/week      PT Plan Current plan remains appropriate    Co-evaluation              AM-PAC PT "6 Clicks" Mobility   Outcome Measure  Help needed turning from your back to your side while in a flat bed without using bedrails?: None Help needed moving from lying on your back to sitting on the side of a flat bed without using bedrails?: A Little Help needed moving to and from a bed to a  chair (including a wheelchair)?: A Little Help needed standing up from a chair using your arms (e.g., wheelchair or bedside chair)?: A Little Help needed to walk in hospital room?: A Lot Help needed climbing 3-5 steps with a railing? : A Lot 6 Click Score: 17    End of Session Equipment Utilized During Treatment: Oxygen Activity Tolerance: Patient tolerated treatment well;Patient limited by fatigue Patient left: with call bell/phone within reach;in bed;with bed alarm set Nurse Communication: Mobility status PT Visit Diagnosis: Unsteadiness on feet (R26.81);Other abnormalities of gait and mobility (R26.89);Muscle weakness (generalized) (M62.81)     Time: 8338-2505 PT Time Calculation (min) (ACUTE ONLY): 18 min  Charges:  $Therapeutic Activity: 8-22 mins  1:40 PM, 04/04/22 Mearl Latin PT, DPT Physical Therapist at [facility]

## 2022-04-04 NOTE — Care Management Important Message (Signed)
Important Message  Patient Details  Name: Nancy Hodges MRN: [id number] Date of Birth: 1945/08/20   Medicare Important Message Given:  Yes     Tommy Medal 04/04/2022, 12:42 PM

## 2022-04-04 NOTE — Progress Notes (Signed)
PROGRESS NOTE   Nancy Hodges  BZJ:[id number] DOB: 02/18/1946 DOA: 04/02/2022 PCP: [name], [name]   Chief Complaint  Patient presents with   Near Syncope   Level of care: Med-Surg  Brief Admission History:  76 y.o. female with medical history significant of atrial flutter, anxiety, GERD, lung cancer scheduled for next infusion in 6 days-infusions are once per month, pacemaker, and more presents to the ED with a chief complaint of nausea, vomiting, lightheadedness.  Patient reports the same set of symptoms that she had in her previous visit to the ER.  At that time she was diagnosed with pneumonia and she was sent home with cefpodoxime and azithromycin.  Today she reports she is experiencing generalized weakness.  Her daughter reports that patient has had significant weight loss over the last 1 month.  She had finished radiation and has been doing once a month infusions for her lung cancer which has left her without an appetite.  Patient only forces herself to eat when it is time to take her medications, and otherwise is not eating.  She reports she felt dizzy, and off balance.  She thought she might pass out but did not.  She had nausea and an episode of vomiting.  Patient reports that since she has finished her course of antibiotics, she has continued to have a cough that is productive of green sputum.  She is has experienced subjective fever, and chills at home.  She has episodes where she breaks out into a sweat as well.  Patient has dyspnea that is worse on exertion.  She does not have orthopnea.  Patient has no other acute complaints at this time.  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  Patient reports that she gets chest pains often, but they are relieved when she changes position.  These are not new.   Assessment and Plan: * CAP (community acquired  pneumonia) - Chest x-ray shows persistent pneumonia -Treated with a full course of cefpodoxime and Zithromax outpatient -Unchanged right sided infiltrate, and new left-sided infiltrate -Blood cultures and sputum culture no growth to date  -Urine antigens for Legionella and strep pneumo (neg) -Levaquin started in the ED, but after discussing with pharmacy cefepime started.  -continue supportive measures   Pneumonia of both lower lobes due to infectious organism -- failed outpatient management, continue IV antibiotics   Protein calorie malnutrition (HCC) - Albumin 2.6 -Reported poor p.o. intake at home -Encourage nutrient dense p.o. intake -Continue to monitor  Weakness - Consult PT eval and treat -Encourage nutrient dense food intake  Tachycardia-bradycardia syndrome ([place]) - With pacer in place -Episode of dizziness, lightheadedness today -dc'd cardiac monitoring after 24 hours  Paroxysmal atrial fibrillation (HCC) - Continue diltiazem and Xarelto  DVT prophylaxis: rivaroxaban Code Status:  Full  Family Communication:  Disposition: Status is: Inpatient Remains inpatient appropriate because: IV antibiotics, failed outpatient management   Consultants:   Procedures:   Antimicrobials:  Levofloxacin 8/30>>  Subjective: Pt reports she was able to sleep better last night, still with SOB but less severe today.  Objective: Vitals:   04/04/22 0238 04/04/22 0252 04/04/22 0402 04/04/22 0834  BP:   (!) 133/57   Pulse: (!) 103  97   Resp:   (!) 24   Temp:   98.3 F (36.8 C)   TempSrc:   Oral   SpO2: 95% 92%  95% 92%  Weight:      Height:       No intake or output data in the 24 hours ending 04/04/22 0851 Filed Weights   04/02/22 1743 04/03/22 0638  Weight: 61.6 kg 62.5 kg   Examination:  General exam: Appears calm and comfortable  Respiratory system: rales heard RLL. Cardiovascular system: normal S1 & S2 heard. No JVD, murmurs, rubs, gallops or clicks. No pedal  edema. Gastrointestinal system: Abdomen is nondistended, soft and nontender. No organomegaly or masses felt. Normal bowel sounds heard. Central nervous system: Alert and oriented. No focal neurological deficits. Extremities: Symmetric 5 x 5 power. Skin: No rashes, lesions or ulcers. Psychiatry: Judgement and insight appear normal. Mood & affect appropriate.   Data Reviewed: I have personally reviewed following labs and imaging studies  CBC: Recent Labs  Lab 04/02/22 1901 04/03/22 0449  WBC 12.0* 9.0  NEUTROABS 10.4* 7.4  HGB 12.0 9.1*  HCT 34.4* 28.2*  MCV 95.3 101.1*  PLT 287 235    Basic Metabolic Panel: Recent Labs  Lab 04/02/22 1901 04/03/22 0654 04/04/22 0730  NA 134* 136  --   K 4.4 3.7  --   CL 99 105  --   CO2 28 24  --   GLUCOSE 191* 134*  --   BUN 12 8  --   CREATININE 0.56 0.41*  --   CALCIUM 8.9 7.8*  --   MG  --  1.6* 2.0    CBG: No results for input(s): "GLUCAP" in the last 168 hours.  Recent Results (from the past 240 hour(s))  SARS Coronavirus 2 by RT PCR (hospital order, performed in [facility] hospital lab) *cepheid single result test* Anterior Nasal Swab     Status: None   Collection Time: 04/02/22  8:25 PM   Specimen: Anterior Nasal Swab  Result Value Ref Range Status   SARS Coronavirus 2 by RT PCR NEGATIVE NEGATIVE Final    Comment: (NOTE) SARS-CoV-2 target nucleic acids are NOT DETECTED.  The SARS-CoV-2 RNA is generally detectable in upper and lower respiratory specimens during the acute phase of infection. The lowest concentration of SARS-CoV-2 viral copies this assay can detect is 250 copies / mL. A negative result does not preclude SARS-CoV-2 infection and should not be used as the sole basis for treatment or other patient management decisions.  A negative result may occur with improper specimen collection / handling, submission of specimen other than nasopharyngeal swab, presence of viral mutation(s) within the areas targeted by  this assay, and inadequate number of viral copies (<250 copies / mL). A negative result must be combined with clinical observations, patient history, and epidemiological information.  Fact Sheet for Patients:   https://www.patel.info/  Fact Sheet for Healthcare Providers: https://hall.com/  This test is not yet approved or  cleared by the [place] FDA and has been authorized for detection and/or diagnosis of SARS-CoV-2 by FDA under an Emergency Use Authorization (EUA).  This EUA will remain in effect (meaning this test can be used) for the duration of the COVID-19 declaration under Section 564(b)(1) of the Act, 21 U.S.C. section 360bbb-3(b)(1), unless the authorization is terminated or revoked sooner.  Performed at [facility], [address], [place], [place] [address]   Blood culture (routine x 2)     Status: None (Preliminary result)   Collection Time: 04/02/22  9:21 PM   Specimen: BLOOD LEFT HAND  Result Value Ref Range Status   Specimen Description BLOOD LEFT HAND  Final  Special Requests   Final    BOTTLES DRAWN AEROBIC AND ANAEROBIC Blood Culture adequate volume   Culture   Final    NO GROWTH 2 DAYS Performed at [facility], [address], [place], [place] [address]    Report Status PENDING  Incomplete  Blood culture (routine x 2)     Status: None (Preliminary result)   Collection Time: 04/02/22  9:43 PM   Specimen: BLOOD LEFT HAND  Result Value Ref Range Status   Specimen Description BLOOD LEFT HAND  Final   Special Requests   Final    BOTTLES DRAWN AEROBIC AND ANAEROBIC Blood Culture adequate volume   Culture   Final    NO GROWTH 2 DAYS Performed at [facility], [address], [name], [place] [address]    Report Status PENDING  Incomplete     Radiology Studies: DG Chest Portable 1 View  Result Date: 04/02/2022 CLINICAL DATA:  Syncope, recent pneumonia.  Lung cancer EXAM: PORTABLE CHEST 1 VIEW COMPARISON:   Chest 03/24/2022 FINDINGS: Patchy airspace disease right lung base unchanged. Small right effusion. Progressive consolidation in the left lung base. This is not present on prior chest CT of 01/09/2022 Port-A-Cath tip SVC.  Dual lead pacemaker unchanged. IMPRESSION: Right lower lobe infiltrate unchanged possible pneumonia. Progression of left lower lobe consolidation, possible pneumonia Electronically Signed   By: Franchot Gallo M.D.   On: 04/02/2022 19:00    Scheduled Meds:  budesonide (PULMICORT) nebulizer solution  0.25 mg Nebulization BID   diltiazem  240 mg Oral Daily   feeding supplement  237 mL Oral BID BM   rivaroxaban  20 mg Oral Q supper   Continuous Infusions:  sodium chloride 75 mL/hr at 04/02/22 2349   ceFEPime (MAXIPIME) IV 2 g (04/04/22 0830)     LOS: 2 days   Time spent: 35 mins  Temiloluwa Recchia Wynetta Emery, MD How to contact the [facility] Attending or Consulting provider [place] or covering provider during after hours [place], for this patient?  Check the care team in [facility] and look for a) attending/consulting TRH provider listed and b) the [facility] team listed Log into [email or web address] and use [place]'s universal password to access. If you do not have the password, please contact the hospital operator. Locate the [facility] provider you are looking for under [facility] and page to a number that you can be directly reached. If you still have difficulty reaching the provider, please page the [facility] (Director on Call) for the Hospitalists listed on amion for assistance.  04/04/2022, 8:51 AM

## 2022-04-04 NOTE — Telephone Encounter (Signed)
  Discharge disposition-  Pt going to [facility] after discharge. I told dtr to keep pt appt on May 09, 2022.

## 2022-04-04 NOTE — Progress Notes (Signed)
Initial Nutrition Assessment  DOCUMENTATION CODES:      INTERVENTION:  Ensure Enlive po BID, each supplement provides 350 kcal and 20 grams of protein.   Regular diet   Nutrition education attached to AVS  NUTRITION DIAGNOSIS:   Inadequate oral intake related to acute illness (penumonia) as evidenced by per patient/family report.   GOAL:  Patient will meet greater than or equal to 90% of their needs   MONITOR:  PO intake, Supplement acceptance, Labs, Weight trends  REASON FOR ASSESSMENT:   Malnutrition Screening Tool    ASSESSMENT: Patient is a 76 yo female with lung cancer (monthly chemo), GERD, pneumonia and weakness.  Patient says she is feeling pressured by her daughter to go to SNF.  Patient lives alone and has been eating poorly the past 2 weeks. Reports 0% intake for breakfast or lunch. Ensure MAX -50% consumed. At baseline she is able to cook for herself. We talked about the importance of energy, protein intake, nutrient dense foods to support muscle and strength. Encouraged hydration.   Patient weights indicate- range of 62-67 kg since March. Loss of 6% x 5 months -not significant for timeframe. Currently 62.5 kg.   Medications reviewed. IV antibiotic     Latest Ref Rng & Units 04/03/2022    6:54 AM 04/02/2022    7:01 PM 03/24/2022    1:14 PM  BMP  Glucose 70 - 99 mg/dL 134  191  216   BUN 8 - 23 mg/dL 8  12  11    Creatinine 0.44 - 1.00 mg/dL 0.41  0.56  0.59   Sodium 135 - 145 mmol/L 136  134  134   Potassium 3.5 - 5.1 mmol/L 3.7  4.4  4.1   Chloride 98 - 111 mmol/L 105  99  99   CO2 22 - 32 mmol/L 24  28  26    Calcium 8.9 - 10.3 mg/dL 7.8  8.9  9.0       NUTRITION - FOCUSED PHYSICAL EXAM: Deferred   Diet Order:   Diet Order             Diet regular Room service appropriate? Yes; Fluid consistency: Thin  Diet effective now                   EDUCATION NEEDS:  Education needs have been addressed  Skin:  Skin Assessment: Reviewed RN  Assessment  Last BM:  8/27  Height:   Ht Readings from Last 1 Encounters:  04/02/22 5\' 2"  (1.575 m)    Weight:   Wt Readings from Last 1 Encounters:  04/03/22 62.5 kg    Ideal Body Weight:   50 kg  BMI:  Body mass index is 25.19 kg/m.  Estimated Nutritional Needs:   Kcal:  1600-1800  Protein:  88-92 gr  Fluid:  >1500 ml daily  Colman Cater MS,RD,CSG,LDN Contact: Shea Evans

## 2022-04-05 DIAGNOSIS — I495 Sick sinus syndrome: Secondary | ICD-10-CM | POA: Diagnosis not present

## 2022-04-05 DIAGNOSIS — J189 Pneumonia, unspecified organism: Secondary | ICD-10-CM | POA: Diagnosis not present

## 2022-04-05 DIAGNOSIS — E440 Moderate protein-calorie malnutrition: Secondary | ICD-10-CM | POA: Diagnosis not present

## 2022-04-05 DIAGNOSIS — I480 Paroxysmal atrial fibrillation: Secondary | ICD-10-CM | POA: Diagnosis not present

## 2022-04-05 MED ORDER — RIVAROXABAN 20 MG PO TABS: 20.0000 mg | ORAL_TABLET | Freq: Every day | ORAL | Status: AC

## 2022-04-05 MED ORDER — ACETAMINOPHEN 325 MG PO TABS: 650.0000 mg | ORAL_TABLET | ORAL | Status: AC | PRN

## 2022-04-05 MED ORDER — LEVOFLOXACIN 750 MG PO TABS: 750.0000 mg | ORAL_TABLET | Freq: Every day | ORAL | 0 refills | Status: AC

## 2022-04-05 MED ORDER — ALPRAZOLAM 0.25 MG PO TABS
0.2500 mg | ORAL_TABLET | Freq: Three times a day (TID) | ORAL | 0 refills | Status: AC | PRN
Start: 2022-04-05 — End: ?

## 2022-04-05 MED ORDER — AZELASTINE HCL 0.1 % NA SOLN: 1.0000 | Freq: Two times a day (BID) | NASAL | 12 refills | Status: AC | PRN

## 2022-04-05 MED ORDER — ALBUTEROL SULFATE HFA 108 (90 BASE) MCG/ACT IN AERS: 2.0000 | INHALATION_SPRAY | RESPIRATORY_TRACT | 2 refills | Status: AC | PRN

## 2022-04-05 MED ORDER — IPRATROPIUM-ALBUTEROL 0.5-2.5 (3) MG/3ML IN SOLN: 3.0000 mL | Freq: Two times a day (BID) | RESPIRATORY_TRACT | Status: AC

## 2022-04-05 MED ORDER — SACCHAROMYCES BOULARDII 250 MG PO CAPS: 250.0000 mg | ORAL_CAPSULE | Freq: Two times a day (BID) | ORAL | 0 refills | Status: AC

## 2022-04-05 MED ORDER — ALPRAZOLAM 0.25 MG PO TABS
0.2500 mg | ORAL_TABLET | Freq: Three times a day (TID) | ORAL | Status: DC | PRN
Start: 2022-04-05 — End: 2022-04-05
  Administered 2022-04-05: 0.25 mg via ORAL
  Filled 2022-04-05: qty 1

## 2022-04-05 NOTE — Progress Notes (Signed)
   04/05/22 1200  Medical Necessity for Transport Certificate --- IF THIS TRANSPORT IS ROUND TRIP OR SCHEDULED AND REPEATED, A PHYSICIAN MUST COMPLETE THIS FORM  Transport from: (Location) [facility]  Transport to (Location) Other (Comment) [facility])  Did the patient arrive from a [place], [place] or Group Home? No  Is this the closest appropriate facility? Yes  Date of Transport Service 04/05/22  Name of [facility]  Round Trip Transport? No  Reason for Transport Discharge  Is this a hospice patient? No  Describe the Medical Condition CAP  Q1 Are ALL the following "true"? 1. Patient unable to get up from bed without assistance  AND  2. Unable to ambulate  AND  3. Unable to sit in a chair, including wheelchair. No  Q2 Could the patient be transported safely by other means of transportation (I.E., wheelchair van)? No  Q3 Please check any of the following conditions that apply at the time of transport: Requires continuous oxygen  Electronic Signature Lonn Georgia, RN  Credentials RN  Date Signed 04/05/22  Print Form Print

## 2022-04-05 NOTE — TOC Transition Note (Signed)
Transition of Care [facility]) - CM/SW Discharge Note   Patient Details  Name: Nancy Hodges MRN: [id number] Date of Birth: 24-Feb-1946  Transition of Care [facility]) CM/SW Contact:  Joanne Chars, LCSW Phone Number: 04/05/2022, 11:59 AM   Clinical Narrative:   Pt discharging to [facility], room 503-B.  RN call report to [phone].    Final next level of care: Skilled Nursing Facility Barriers to Discharge: Barriers Resolved   Patient Goals and CMS Choice        Discharge Placement              Patient chooses bed at:  [facility]) Patient to be transferred to facility by: [place] Name of family member notified: daughter Helene Kelp Patient and family notified of of transfer: 04/05/22  Discharge Plan and Services                                     Social Determinants of Health (SDOH) Interventions     Readmission Risk Interventions     No data to display

## 2022-04-05 NOTE — Discharge Summary (Addendum)
Physician Discharge Summary  Nancy Hodges HAL:[id number] DOB: 1945/10/18 DOA: 04/02/2022  PCP: [name], [name]  Admit date: 04/02/2022 Discharge date: 04/05/2022  Admitted From: Home  Disposition: SNF Rehab   Recommendations for Outpatient Follow-up:  Follow up with PCP in 2 weeks Please consider outpatient palliative medicine consultation  Discharge Condition: STABLE   CODE STATUS: FULL DIET: regular    Brief Hospitalization Summary: Please see all hospital notes, images, labs for full details of the hospitalization. ADMISSION HPI: Brief Admission History:  76 y.o. female with medical history significant of atrial flutter, anxiety, GERD, lung cancer scheduled for next infusion in 6 days-infusions are once per month, pacemaker, and more presents to the ED with a chief complaint of nausea, vomiting, lightheadedness.  Patient reports the same set of symptoms that she had in her previous visit to the ER.  At that time she was diagnosed with pneumonia and she was sent home with cefpodoxime and azithromycin.  Today she reports she is experiencing generalized weakness.  Her daughter reports that patient has had significant weight loss over the last 1 month.  She had finished radiation and has been doing once a month infusions for her lung cancer which has left her without an appetite.  Patient only forces herself to eat when it is time to take her medications, and otherwise is not eating.  She reports she felt dizzy, and off balance.  She thought she might pass out but did not.  She had nausea and an episode of vomiting.  Patient reports that since she has finished her course of antibiotics, she has continued to have a cough that is productive of green sputum.  She is has experienced subjective fever, and chills at home.  She has episodes where she breaks out into a sweat as well.  Patient has dyspnea that is worse on exertion.  She does not have orthopnea.  Patient has no other acute  complaints at this time.  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  Patient reports that she gets chest pains often, but they are relieved when she changes position.  These are not new.  HOSPITAL COURSE BY PROBLEM   CAP (community acquired pneumonia) - Chest x-ray showed pneumonia on admission - Pt was treated with a full course of cefpodoxime and Zithromax outpatient - CXR with findings of unchanged right sided infiltrate, and new left-sided infiltrate -Blood cultures and sputum culture no growth to date  -Urine antigens for Legionella and strep pneumo (neg) -Pt was treated with IV cefepime and levofloxacin. Pt is clinically improving.  Pt stable to DC today.    Pneumonia of both lower lobes due to infectious organism -- failed outpatient management, improved after treatment with IV antibiotics Pt stable to DC to SNF today.    Protein calorie malnutrition (HCC) - Albumin 2.6 -Reported poor p.o. intake at home -Encourage nutrient dense p.o. intake -appreciate dietitian consultation - regular diet recommended   Weakness - Generalized  - PT eval and treat recommending SNF which patient is agreeable  - DC to SNF rehab today for further rehabilitation   Generalized Anxiety Disorder - trial of alprazolam 0.25 mg TID prn for severe symptoms of panic  Tachycardia-bradycardia syndrome  - chronic - With pacer in place - has been stable, controlled on current diltiazem dosing -dc'd cardiac monitoring after 24 hours   Paroxysmal atrial fibrillation  -  Continue diltiazem and rivaroxaban    DVT prophylaxis: rivaroxaban Code Status:  Full   Discharge Diagnoses:  Principal Problem:   CAP (community acquired pneumonia) Active Problems:   Paroxysmal atrial fibrillation (HCC)   Tachycardia-bradycardia syndrome (HCC)   Weakness   Protein calorie malnutrition  (HCC)   Pneumonia of both lower lobes due to infectious organism   Discharge Instructions:  Allergies as of 04/05/2022       Reactions   Chlorpheniramine-dm Anaphylaxis, Other (See Comments)   Pt states BP dropped; developed irregular heartbeat    Prednisone Shortness Of Breath, Palpitations, Other (See Comments)   Double vision. History is not consistent with any allergy. "drove me crazy"   Rofecoxib Shortness Of Breath, Other (See Comments)   Stomach cramps Other reaction(s): shortness of breath   Septra [sulfamethoxazole-trimethoprim] Shortness Of Breath   Doxycycline Monohydrate Diarrhea, Nausea And Vomiting, Dermatitis   Fluticasone    Other reaction(s): dizziness   Molds & Smuts    Meclizine Rash   Penicillins Swelling, Rash, Other (See Comments)   DID THE REACTION INVOLVE: Swelling of the face/tongue/throat, SOB, or low BP? No Sudden or severe rash/hives, skin peeling, or the inside of the mouth or nose? Yes Did it require medical treatment? No  When did it last happen?      unknown If all above answers are "NO", may proceed with cephalosporin use.        Medication List     STOP taking these medications    azithromycin 250 MG tablet Commonly known as: ZITHROMAX   benzonatate 200 MG capsule Commonly known as: TESSALON       TAKE these medications    acetaminophen 325 MG tablet Commonly known as: TYLENOL Take 2 tablets (650 mg total) by mouth every 4 (four) hours as needed for moderate pain, fever or headache. What changed:  medication strength how much to take when to take this reasons to take this   albuterol 108 (90 Base) MCG/ACT inhaler Commonly known as: VENTOLIN HFA Inhale 2 puffs into the lungs every 4 (four) hours as needed for wheezing or shortness of breath. What changed: when to take this   ALPRAZolam 0.25 MG tablet Commonly known as: XANAX Take 1 tablet (0.25 mg total) by mouth 3 (three) times daily as needed for anxiety.   azelastine  0.1 % nasal spray Commonly known as: ASTELIN Place 1 spray into both nostrils 2 (two) times daily as needed for rhinitis or allergies. Use in each nostril as directed What changed:  when to take this reasons to take this   calcium carbonate 500 MG chewable tablet Commonly known as: TUMS - dosed in mg elemental calcium Chew 1 tablet by mouth 2 (two) times daily as needed for indigestion or heartburn.   Cartia XT 240 MG 24 hr capsule Generic drug: diltiazem TAKE 1 CAPSULE BY MOUTH DAILY   dimenhyDRINATE 50 MG tablet Commonly known as: DRAMAMINE Take 12.5 mg by mouth every 8 (eight) hours as needed for dizziness.   ipratropium-albuterol 0.5-2.5 (3) MG/3ML Soln Commonly known as: DUONEB Take 3 mLs by nebulization 2 (two) times daily. What changed:  when to take this reasons to take this   levofloxacin 750 MG tablet Commonly known as: Levaquin Take 1 tablet (750 mg total) by mouth daily for 1 day. Start taking on: April 06, 2022   lidocaine-prilocaine cream Commonly known as: EMLA APPLY TOPICALLY TO TO THE AFFECTED AREA AS NEEDED What changed: See the new instructions.   ondansetron  4 MG disintegrating tablet Commonly known as: ZOFRAN-ODT Take 1 tablet (4 mg total) by mouth every 8 (eight) hours as needed for nausea or vomiting.   REFRESH OP Place 1 drop into both eyes daily as needed (dry eyes).   rivaroxaban 20 MG Tabs tablet Commonly known as: Xarelto Take 1 tablet (20 mg total) by mouth daily with supper. What changed: See the new instructions.   saccharomyces boulardii 250 MG capsule Commonly known as: Florastor Take 1 capsule (250 mg total) by mouth 2 (two) times daily for 14 days.        Contact information for follow-up providers     [name], [name]. Schedule an appointment as soon as possible for a visit in 2 week(s).   Specialty: Family Medicine Why: Hospital Follow Up Contact information: [address] [place]  [address] [phone]              Contact information for after-discharge care     [facility] .   Service: Skilled Nursing Contact information: [place] [address] [phone]                    Allergies  Allergen Reactions   Chlorpheniramine-Dm Anaphylaxis and Other (See Comments)    Pt states BP dropped; developed irregular heartbeat    Prednisone Shortness Of Breath, Palpitations and Other (See Comments)    Double vision. History is not consistent with any allergy. "drove me crazy"   Rofecoxib Shortness Of Breath and Other (See Comments)    Stomach cramps Other reaction(s): shortness of breath   Septra [Sulfamethoxazole-Trimethoprim] Shortness Of Breath   Doxycycline Monohydrate Diarrhea, Nausea And Vomiting and Dermatitis   Fluticasone     Other reaction(s): dizziness   Molds & Smuts    Meclizine Rash   Penicillins Swelling, Rash and Other (See Comments)    DID THE REACTION INVOLVE: Swelling of the face/tongue/throat, SOB, or low BP? No Sudden or severe rash/hives, skin peeling, or the inside of the mouth or nose? Yes Did it require medical treatment? No  When did it last happen?      unknown If all above answers are "NO", may proceed with cephalosporin use.    Allergies as of 04/05/2022       Reactions   Chlorpheniramine-dm Anaphylaxis, Other (See Comments)   Pt states BP dropped; developed irregular heartbeat    Prednisone Shortness Of Breath, Palpitations, Other (See Comments)   Double vision. History is not consistent with any allergy. "drove me crazy"   Rofecoxib Shortness Of Breath, Other (See Comments)   Stomach cramps Other reaction(s): shortness of breath   Septra [sulfamethoxazole-trimethoprim] Shortness Of Breath   Doxycycline Monohydrate Diarrhea, Nausea And Vomiting, Dermatitis   Fluticasone    Other reaction(s): dizziness   Molds & Smuts    Meclizine Rash    Penicillins Swelling, Rash, Other (See Comments)   DID THE REACTION INVOLVE: Swelling of the face/tongue/throat, SOB, or low BP? No Sudden or severe rash/hives, skin peeling, or the inside of the mouth or nose? Yes Did it require medical treatment? No  When did it last happen?      unknown If all above answers are "NO", may proceed with cephalosporin use.        Medication List     STOP taking these medications    azithromycin 250 MG tablet Commonly known as: ZITHROMAX   benzonatate 200 MG  capsule Commonly known as: TESSALON       TAKE these medications    acetaminophen 325 MG tablet Commonly known as: TYLENOL Take 2 tablets (650 mg total) by mouth every 4 (four) hours as needed for moderate pain, fever or headache. What changed:  medication strength how much to take when to take this reasons to take this   albuterol 108 (90 Base) MCG/ACT inhaler Commonly known as: VENTOLIN HFA Inhale 2 puffs into the lungs every 4 (four) hours as needed for wheezing or shortness of breath. What changed: when to take this   ALPRAZolam 0.25 MG tablet Commonly known as: XANAX Take 1 tablet (0.25 mg total) by mouth 3 (three) times daily as needed for anxiety.   azelastine 0.1 % nasal spray Commonly known as: ASTELIN Place 1 spray into both nostrils 2 (two) times daily as needed for rhinitis or allergies. Use in each nostril as directed What changed:  when to take this reasons to take this   calcium carbonate 500 MG chewable tablet Commonly known as: TUMS - dosed in mg elemental calcium Chew 1 tablet by mouth 2 (two) times daily as needed for indigestion or heartburn.   Cartia XT 240 MG 24 hr capsule Generic drug: diltiazem TAKE 1 CAPSULE BY MOUTH DAILY   dimenhyDRINATE 50 MG tablet Commonly known as: DRAMAMINE Take 12.5 mg by mouth every 8 (eight) hours as needed for dizziness.   ipratropium-albuterol 0.5-2.5 (3) MG/3ML Soln Commonly known as: DUONEB Take 3 mLs by  nebulization 2 (two) times daily. What changed:  when to take this reasons to take this   levofloxacin 750 MG tablet Commonly known as: Levaquin Take 1 tablet (750 mg total) by mouth daily for 1 day. Start taking on: April 06, 2022   lidocaine-prilocaine cream Commonly known as: EMLA APPLY TOPICALLY TO TO THE AFFECTED AREA AS NEEDED What changed: See the new instructions.   ondansetron 4 MG disintegrating tablet Commonly known as: ZOFRAN-ODT Take 1 tablet (4 mg total) by mouth every 8 (eight) hours as needed for nausea or vomiting.   REFRESH OP Place 1 drop into both eyes daily as needed (dry eyes).   rivaroxaban 20 MG Tabs tablet Commonly known as: Xarelto Take 1 tablet (20 mg total) by mouth daily with supper. What changed: See the new instructions.   saccharomyces boulardii 250 MG capsule Commonly known as: Florastor Take 1 capsule (250 mg total) by mouth 2 (two) times daily for 14 days.       Procedures/Studies: DG Chest Portable 1 View  Result Date: 04/02/2022 CLINICAL DATA:  Syncope, recent pneumonia.  Lung cancer EXAM: PORTABLE CHEST 1 VIEW COMPARISON:  Chest 03/24/2022 FINDINGS: Patchy airspace disease right lung base unchanged. Small right effusion. Progressive consolidation in the left lung base. This is not present on prior chest CT of 01/09/2022 Port-A-Cath tip SVC.  Dual lead pacemaker unchanged. IMPRESSION: Right lower lobe infiltrate unchanged possible pneumonia. Progression of left lower lobe consolidation, possible pneumonia Electronically Signed   By: Franchot Gallo M.D.   On: 04/02/2022 19:00   CT Head Wo Contrast  Result Date: 03/24/2022 CLINICAL DATA:  Mental status change EXAM: CT HEAD WITHOUT CONTRAST TECHNIQUE: Contiguous axial images were obtained from the base of the skull through the vertex without intravenous contrast. RADIATION DOSE REDUCTION: This exam was performed according to the departmental dose-optimization program which includes  automated exposure control, adjustment of the mA and/or kV according to patient size and/or use of iterative reconstruction technique. COMPARISON:  10/08/2021 head CT FINDINGS: Brain: No acute territorial infarction, hemorrhage, or intracranial mass is visualized. Small chronic infarcts involving the left frontal lobe and right cerebellum. Atrophy and chronic small vessel ischemic changes of the white matter. Stable ventricle size. Vascular: No hyperdense vessels.  No unexpected calcification Skull: Normal. Negative for fracture or focal lesion. Sinuses/Orbits: No acute finding. Other: None IMPRESSION: 1. No CT evidence for acute intracranial abnormality. 2. Atrophy and chronic small vessel ischemic changes of the white matter. Multiple small chronic infarcts. Electronically Signed   By: Donavan Foil M.D.   On: 03/24/2022 15:40   DG Chest Portable 1 View  Result Date: 03/24/2022 CLINICAL DATA:  Cough, altered mental status EXAM: PORTABLE CHEST 1 VIEW COMPARISON:  Previous chest radiographs done on 01/08/2018 and CT chest done on 01/09/2022 FINDINGS: Cardiac size is within normal limits. There are no signs of pulmonary edema. Pacemaker battery is seen in left infraclavicular region. Tip of right IJ chest port is seen at the junction of superior vena cava and right atrium. There are patchy infiltrates in right mid and both lower lung fields. There is blunting of right lateral CP angle. There is no pneumothorax. IMPRESSION: Infiltrates are seen in right mid and both lower lung fields suggesting atelectasis/pneumonia. Small right pleural effusion. Electronically Signed   By: Elmer Picker M.D.   On: 03/24/2022 13:38     Subjective: Pt reports having anxiety symptoms, breathing has been much better, cough improved, no fever or chills.   Discharge Exam: Vitals:   04/05/22 0743 04/05/22 0800  BP:  (!) 138/58  Pulse:  95  Resp:  (!) 22  Temp:  97.8 F (36.6 C)  SpO2: 91% 93%   Vitals:   04/05/22  0457 04/05/22 0518 04/05/22 0743 04/05/22 0800  BP: (!) 141/54   (!) 138/58  Pulse: 98   95  Resp: 16   (!) 22  Temp: 97.6 F (36.4 C)   97.8 F (36.6 C)  TempSrc:    Oral  SpO2: 94%  91% 93%  Weight:  62.4 kg    Height:       General: Pt is alert, awake, not in acute distress Cardiovascular: normal S1/S2 +, no rubs, no gallops Respiratory:  no wheezing, no rhonchi no increased work of breathing Abdominal: Soft, NT, ND, bowel sounds + Extremities: no edema, no cyanosis   The results of significant diagnostics from this hospitalization (including imaging, microbiology, ancillary and laboratory) are listed below for reference.     Microbiology: Recent Results (from the past 240 hour(s))  SARS Coronavirus 2 by RT PCR (hospital order, performed in [facility] hospital lab) *cepheid single result test* Anterior Nasal Swab     Status: None   Collection Time: 04/02/22  8:25 PM   Specimen: Anterior Nasal Swab  Result Value Ref Range Status   SARS Coronavirus 2 by RT PCR NEGATIVE NEGATIVE Final    Comment: (NOTE) SARS-CoV-2 target nucleic acids are NOT DETECTED.  The SARS-CoV-2 RNA is generally detectable in upper and lower respiratory specimens during the acute phase of infection. The lowest concentration of SARS-CoV-2 viral copies this assay can detect is 250 copies / mL. A negative result does not preclude SARS-CoV-2 infection and should not be used as the sole basis for treatment or other patient management decisions.  A negative result may occur with improper specimen collection / handling, submission of specimen other than nasopharyngeal swab, presence of viral mutation(s) within the areas targeted by this assay, and inadequate  number of viral copies (<250 copies / mL). A negative result must be combined with clinical observations, patient history, and epidemiological information.  Fact Sheet for Patients:   https://www.patel.info/  Fact Sheet for  Healthcare Providers: https://hall.com/  This test is not yet approved or  cleared by the [place] FDA and has been authorized for detection and/or diagnosis of SARS-CoV-2 by FDA under an Emergency Use Authorization (EUA).  This EUA will remain in effect (meaning this test can be used) for the duration of the COVID-19 declaration under Section 564(b)(1) of the Act, 21 U.S.C. section 360bbb-3(b)(1), unless the authorization is terminated or revoked sooner.  Performed at [facility], [address], [place], [place] [address]   Blood culture (routine x 2)     Status: None (Preliminary result)   Collection Time: 04/02/22  9:21 PM   Specimen: BLOOD LEFT HAND  Result Value Ref Range Status   Specimen Description BLOOD LEFT HAND  Final   Special Requests   Final    BOTTLES DRAWN AEROBIC AND ANAEROBIC Blood Culture adequate volume   Culture   Final    NO GROWTH 3 DAYS Performed at [facility], [address], [place],  [address]    Report Status PENDING  Incomplete  Blood culture (routine x 2)     Status: None (Preliminary result)   Collection Time: 04/02/22  9:43 PM   Specimen: BLOOD LEFT HAND  Result Value Ref Range Status   Specimen Description BLOOD LEFT HAND  Final   Special Requests   Final    BOTTLES DRAWN AEROBIC AND ANAEROBIC Blood Culture adequate volume   Culture   Final    NO GROWTH 3 DAYS Performed at [facility], [address] [place],  [address]    Report Status PENDING  Incomplete     Labs: BNP (last 3 results) No results for input(s): "BNP" in the last 8760 hours. Basic Metabolic Panel: Recent Labs  Lab 04/02/22 1901 04/03/22 0654 04/04/22 0730  NA 134* 136  --   K 4.4 3.7  --   CL 99 105  --   CO2 28 24  --   GLUCOSE 191* 134*  --   BUN 12 8  --   CREATININE 0.56 0.41*  --   CALCIUM 8.9 7.8*  --   MG  --  1.6* 2.0   Liver Function Tests: Recent Labs  Lab 04/02/22 1901 04/03/22 0654  AST 40 29  ALT  27 20  ALKPHOS 165* 125  BILITOT 1.3* 1.0  PROT 6.5 5.4*  ALBUMIN 2.6* 2.1*   No results for input(s): "LIPASE", "AMYLASE" in the last 168 hours. No results for input(s): "AMMONIA" in the last 168 hours. CBC: Recent Labs  Lab 04/02/22 1901 04/03/22 0449  WBC 12.0* 9.0  NEUTROABS 10.4* 7.4  HGB 12.0 9.1*  HCT 34.4* 28.2*  MCV 95.3 101.1*  PLT 287 209   Cardiac Enzymes: No results for input(s): "CKTOTAL", "CKMB", "CKMBINDEX", "TROPONINI" in the last 168 hours. BNP: Invalid input(s): "POCBNP" CBG: No results for input(s): "GLUCAP" in the last 168 hours. D-Dimer No results for input(s): "DDIMER" in the last 72 hours. Hgb A1c No results for input(s): "HGBA1C" in the last 72 hours. Lipid Profile No results for input(s): "CHOL", "HDL", "LDLCALC", "TRIG", "CHOLHDL", "LDLDIRECT" in the last 72 hours. Thyroid function studies No results for input(s): "TSH", "T4TOTAL", "T3FREE", "THYROIDAB" in the last 72 hours.  Invalid input(s): "FREET3" Anemia work up No results for input(s): "VITAMINB12", "FOLATE", "  FERRITIN", "TIBC", "IRON", "RETICCTPCT" in the last 72 hours. Urinalysis    Component Value Date/Time   COLORURINE YELLOW 04/02/2022 2025   APPEARANCEUR CLEAR 04/02/2022 2025   LABSPEC 1.009 04/02/2022 2025   PHURINE 6.0 04/02/2022 2025   GLUCOSEU NEGATIVE 04/02/2022 2025   HGBUR NEGATIVE 04/02/2022 2025   BILIRUBINUR NEGATIVE 04/02/2022 2025   KETONESUR NEGATIVE 04/02/2022 2025   PROTEINUR NEGATIVE 04/02/2022 2025   UROBILINOGEN 0.2 07/29/2014 1028   NITRITE NEGATIVE 04/02/2022 2025   LEUKOCYTESUR NEGATIVE 04/02/2022 2025   Sepsis Labs Recent Labs  Lab 04/02/22 1901 04/03/22 0449  WBC 12.0* 9.0   Microbiology Recent Results (from the past 240 hour(s))  SARS Coronavirus 2 by RT PCR (hospital order, performed in [place] hospital lab) *cepheid single result test* Anterior Nasal Swab     Status: None   Collection Time: 04/02/22  8:25 PM   Specimen: Anterior Nasal  Swab  Result Value Ref Range Status   SARS Coronavirus 2 by RT PCR NEGATIVE NEGATIVE Final    Comment: (NOTE) SARS-CoV-2 target nucleic acids are NOT DETECTED.  The SARS-CoV-2 RNA is generally detectable in upper and lower respiratory specimens during the acute phase of infection. The lowest concentration of SARS-CoV-2 viral copies this assay can detect is 250 copies / mL. A negative result does not preclude SARS-CoV-2 infection and should not be used as the sole basis for treatment or other patient management decisions.  A negative result may occur with improper specimen collection / handling, submission of specimen other than nasopharyngeal swab, presence of viral mutation(s) within the areas targeted by this assay, and inadequate number of viral copies (<250 copies / mL). A negative result must be combined with clinical observations, patient history, and epidemiological information.  Fact Sheet for Patients:   https://www.patel.info/  Fact Sheet for Healthcare Providers: https://hall.com/  This test is not yet approved or  cleared by the [place] FDA and has been authorized for detection and/or diagnosis of SARS-CoV-2 by FDA under an Emergency Use Authorization (EUA).  This EUA will remain in effect (meaning this test can be used) for the duration of the COVID-19 declaration under Section 564(b)(1) of the Act, 21 U.S.C. section 360bbb-3(b)(1), unless the authorization is terminated or revoked sooner.  Performed at [facility], [address], [place], [place] [address]   Blood culture (routine x 2)     Status: None (Preliminary result)   Collection Time: 04/02/22  9:21 PM   Specimen: BLOOD LEFT HAND  Result Value Ref Range Status   Specimen Description BLOOD LEFT HAND  Final   Special Requests   Final    BOTTLES DRAWN AEROBIC AND ANAEROBIC Blood Culture adequate volume   Culture   Final    NO GROWTH 3 DAYS Performed at [facility], [address] [place], [place] [address]    Report Status PENDING  Incomplete  Blood culture (routine x 2)     Status: None (Preliminary result)   Collection Time: 04/02/22  9:43 PM   Specimen: BLOOD LEFT HAND  Result Value Ref Range Status   Specimen Description BLOOD LEFT HAND  Final   Special Requests   Final    BOTTLES DRAWN AEROBIC AND ANAEROBIC Blood Culture adequate volume   Culture   Final    NO GROWTH 3 DAYS Performed at [facility], [address], [place], [place] [address]    Report Status PENDING  Incomplete   Time coordinating discharge: 35 mins   SIGNED:  Irwin Brakeman,  MD  [facility] 04/05/2022, 11:11 AM How to contact the [facility] Attending or Consulting provider [place] or covering provider during after hours [place], for this patient?  Check the care team in [facility] and look for a) attending/consulting [facility] provider listed and b) the [facility] team listed Log into [email or web address] and use [place]'s universal password to access. If you do not have the password, please contact the hospital operator. Locate the [facility] provider you are looking for under [facility] and page to a number that you can be directly reached. If you still have difficulty reaching the provider, please page the [facility] (Director on Call) for the Hospitalists listed on amion for assistance.

## 2022-04-05 NOTE — Progress Notes (Signed)
attempted to call report to [facility] x 2 without success.

## 2022-04-05 NOTE — Progress Notes (Signed)
Attempted to wean to room air and after 30 min. pt O2 sat was 86%. 2L [place] applied and after 3 min O2 sat went to 91%. MD made aware.

## 2022-04-05 NOTE — TOC Progression Note (Signed)
Transition of Care [facility]) - Progression Note    Patient Details  Name: Nancy Hodges MRN: [id number] Date of Birth: [birth year]  [facility] CM/SW Contact  Joanne Chars, LCSW Phone Number: 04/05/2022, 11:15 AM  Clinical Narrative:  CSW spoke with [facility] regarding DC today.  They have had to move pts last night, will not have bed on A hall as she discussed with daughter yesterday.   CSW spoke with daughter Helene Kelp about the above.  This is significant issue for her and may change her mind about choosing [facility].  Helene Kelp called [facility], called CSW back and wants to pursue [facility] instead. CSW confirmed with Allison[facility] that they can accept pt today.  CSW discussed transport with Helene Kelp, pt is cleared for family to transport.  Helene Kelp does not want to do this, wants to use ambulance transport, discussed potential billing/med necessity issues but she does want to use ambulance transport.      Expected Discharge Plan: [place] Barriers to Discharge: Continued Medical Work up  Expected Discharge Plan and Services Expected Discharge Plan: [place] arrangements for the past 2 months: Mobile Home Expected Discharge Date: 04/05/22                                     Social Determinants of Health (SDOH) Interventions    Readmission Risk Interventions     No data to display

## 2022-04-07 LAB — CBC WITH DIFFERENTIAL/PLATELET
Abs Immature Granulocytes: 0.05 10*3/uL (ref 0.00–0.07)
Basophils Absolute: 0 10*3/uL (ref 0.0–0.1)
Basophils Relative: 0 %
Eosinophils Absolute: 0.1 10*3/uL (ref 0.0–0.5)
Eosinophils Relative: 1 %
HCT: 28.2 % — ABNORMAL LOW (ref 36.0–46.0)
Hemoglobin: 9.1 g/dL — ABNORMAL LOW (ref 12.0–15.0)
Immature Granulocytes: 1 %
Lymphocytes Relative: 5 %
Lymphs Abs: 0.4 10*3/uL — ABNORMAL LOW (ref 0.7–4.0)
MCH: 32.6 pg (ref 26.0–34.0)
MCHC: 32.3 g/dL (ref 30.0–36.0)
MCV: 101.1 fL — ABNORMAL HIGH (ref 80.0–100.0)
Monocytes Absolute: 1 10*3/uL (ref 0.1–1.0)
Monocytes Relative: 11 %
Neutro Abs: 7.4 10*3/uL (ref 1.7–7.7)
Neutrophils Relative %: 82 %
Platelets: 209 10*3/uL (ref 150–400)
RBC: 2.79 MIL/uL — ABNORMAL LOW (ref 3.87–5.11)
RDW: 13.7 % (ref 11.5–15.5)
WBC: 9 10*3/uL (ref 4.0–10.5)
nRBC: 0 % (ref 0.0–0.2)

## 2022-04-08 ENCOUNTER — Other Ambulatory Visit: Payer: Medicare Other

## 2022-04-08 ENCOUNTER — Inpatient Hospital Stay: Payer: Medicare Other | Admitting: Internal Medicine

## 2022-04-08 ENCOUNTER — Inpatient Hospital Stay: Payer: Medicare Other

## 2022-04-08 LAB — CULTURE, BLOOD (ROUTINE X 2)
Culture: NO GROWTH
Culture: NO GROWTH
Special Requests: ADEQUATE
Special Requests: ADEQUATE

## 2022-04-15 ENCOUNTER — Telehealth: Payer: Self-pay | Admitting: Medical Oncology

## 2022-04-15 ENCOUNTER — Telehealth: Payer: Self-pay | Admitting: Internal Medicine

## 2022-04-15 NOTE — Telephone Encounter (Signed)
Pt's daughter would like a callback regarding pt's current condition of her heart. Daughter states that she is having to decide whether pt needs to be in a Long Term facility or able to go home with caregiver coming in. Please advise

## 2022-04-15 NOTE — Telephone Encounter (Signed)
Explained to pt that Dr Julien Nordmann will not treat pt with nivolumab.

## 2022-04-15 NOTE — Telephone Encounter (Signed)
Informed patient's daughter, DPR, of Estrella Myrtle PA's advisement.    Shirley Friar, PA-C  You 12 minutes ago (4:21 PM)   It appears that her cardiac issues are relatively well controlled.  These do not currently appear to have a bearing on her living situation. Would follow any recommendations from SNF and/or PCP.       Patient has a remote check coming up. Patient's daughter stated she will be in SNF and not sure what to do. Will send to device clinic to call and help.

## 2022-04-21 NOTE — Telephone Encounter (Signed)
I spoke with the patient daughter and she agreed to take the patient monitor to the facility.

## 2022-04-30 ENCOUNTER — Ambulatory Visit (INDEPENDENT_AMBULATORY_CARE_PROVIDER_SITE_OTHER): Payer: Medicare Other

## 2022-04-30 ENCOUNTER — Telehealth: Payer: Self-pay | Admitting: Medical Oncology

## 2022-04-30 ENCOUNTER — Other Ambulatory Visit: Payer: Self-pay | Admitting: Medical Oncology

## 2022-04-30 DIAGNOSIS — I495 Sick sinus syndrome: Secondary | ICD-10-CM

## 2022-04-30 DIAGNOSIS — C3490 Malignant neoplasm of unspecified part of unspecified bronchus or lung: Secondary | ICD-10-CM

## 2022-04-30 NOTE — Telephone Encounter (Addendum)
Pt has completed 3 different antibiotics for presumed pneumonia . She is not any better and O2 dropped.     09/26 CXR  conclusion "...modest pleural effusions and pneumonia should be considered".  Dr Julien Nordmann ordered CT C/A/P scans to be done at [facility]. Candace at [facility] is coordinating the appt. She will have labs drawn on pt this Friday and fax the results to [facility] radiology.

## 2022-05-02 ENCOUNTER — Encounter: Payer: Self-pay | Admitting: Internal Medicine

## 2022-05-02 LAB — CUP PACEART REMOTE DEVICE CHECK
Battery Remaining Longevity: 84 mo
Battery Remaining Percentage: 63 %
Battery Voltage: 3.02 V
Brady Statistic RV Percent Paced: 1 %
Date Time Interrogation Session: 20230927181118
Implantable Lead Implant Date: 20190329
Implantable Lead Implant Date: 20190329
Implantable Lead Location: 753859
Implantable Lead Location: 753860
Implantable Pulse Generator Implant Date: 20190329
Lead Channel Impedance Value: 550 Ohm
Lead Channel Pacing Threshold Amplitude: 0.5 V
Lead Channel Pacing Threshold Pulse Width: 0.5 ms
Lead Channel Sensing Intrinsic Amplitude: 12 mV
Lead Channel Setting Pacing Amplitude: 2.5 V
Lead Channel Setting Pacing Pulse Width: 0.5 ms
Lead Channel Setting Sensing Sensitivity: 2 mV
Pulse Gen Model: 2272
Pulse Gen Serial Number: 9003317

## 2022-05-04 ENCOUNTER — Encounter (HOSPITAL_COMMUNITY): Payer: Self-pay | Admitting: Emergency Medicine

## 2022-05-04 ENCOUNTER — Other Ambulatory Visit: Payer: Self-pay

## 2022-05-04 ENCOUNTER — Emergency Department (HOSPITAL_COMMUNITY): Payer: Medicare Other

## 2022-05-04 ENCOUNTER — Inpatient Hospital Stay (HOSPITAL_COMMUNITY): Payer: Medicare Other

## 2022-05-04 ENCOUNTER — Inpatient Hospital Stay (HOSPITAL_COMMUNITY)
Admission: EM | Admit: 2022-05-04 | Discharge: 2022-06-04 | DRG: 871 | Disposition: E | Payer: Medicare Other | Source: Skilled Nursing Facility | Attending: Internal Medicine | Admitting: Internal Medicine

## 2022-05-04 DIAGNOSIS — Z801 Family history of malignant neoplasm of trachea, bronchus and lung: Secondary | ICD-10-CM

## 2022-05-04 DIAGNOSIS — I4819 Other persistent atrial fibrillation: Secondary | ICD-10-CM | POA: Diagnosis present

## 2022-05-04 DIAGNOSIS — G9341 Metabolic encephalopathy: Secondary | ICD-10-CM | POA: Diagnosis present

## 2022-05-04 DIAGNOSIS — R400 Somnolence: Secondary | ICD-10-CM | POA: Diagnosis present

## 2022-05-04 DIAGNOSIS — Z515 Encounter for palliative care: Secondary | ICD-10-CM | POA: Diagnosis not present

## 2022-05-04 DIAGNOSIS — I69351 Hemiplegia and hemiparesis following cerebral infarction affecting right dominant side: Secondary | ICD-10-CM

## 2022-05-04 DIAGNOSIS — Z85118 Personal history of other malignant neoplasm of bronchus and lung: Secondary | ICD-10-CM

## 2022-05-04 DIAGNOSIS — Z66 Do not resuscitate: Secondary | ICD-10-CM | POA: Diagnosis present

## 2022-05-04 DIAGNOSIS — J189 Pneumonia, unspecified organism: Secondary | ICD-10-CM | POA: Diagnosis present

## 2022-05-04 DIAGNOSIS — J9621 Acute and chronic respiratory failure with hypoxia: Secondary | ICD-10-CM | POA: Diagnosis present

## 2022-05-04 DIAGNOSIS — I5033 Acute on chronic diastolic (congestive) heart failure: Secondary | ICD-10-CM | POA: Diagnosis present

## 2022-05-04 DIAGNOSIS — R791 Abnormal coagulation profile: Secondary | ICD-10-CM | POA: Diagnosis present

## 2022-05-04 DIAGNOSIS — J439 Emphysema, unspecified: Secondary | ICD-10-CM | POA: Diagnosis present

## 2022-05-04 DIAGNOSIS — R6521 Severe sepsis with septic shock: Secondary | ICD-10-CM | POA: Diagnosis not present

## 2022-05-04 DIAGNOSIS — N179 Acute kidney failure, unspecified: Secondary | ICD-10-CM | POA: Diagnosis not present

## 2022-05-04 DIAGNOSIS — K219 Gastro-esophageal reflux disease without esophagitis: Secondary | ICD-10-CM | POA: Diagnosis present

## 2022-05-04 DIAGNOSIS — I480 Paroxysmal atrial fibrillation: Secondary | ICD-10-CM | POA: Diagnosis not present

## 2022-05-04 DIAGNOSIS — J9602 Acute respiratory failure with hypercapnia: Secondary | ICD-10-CM | POA: Diagnosis not present

## 2022-05-04 DIAGNOSIS — J9601 Acute respiratory failure with hypoxia: Secondary | ICD-10-CM | POA: Diagnosis present

## 2022-05-04 DIAGNOSIS — R6520 Severe sepsis without septic shock: Secondary | ICD-10-CM | POA: Diagnosis not present

## 2022-05-04 DIAGNOSIS — Z90711 Acquired absence of uterus with remaining cervical stump: Secondary | ICD-10-CM

## 2022-05-04 DIAGNOSIS — I110 Hypertensive heart disease with heart failure: Secondary | ICD-10-CM | POA: Diagnosis present

## 2022-05-04 DIAGNOSIS — I272 Pulmonary hypertension, unspecified: Secondary | ICD-10-CM | POA: Diagnosis present

## 2022-05-04 DIAGNOSIS — Z8249 Family history of ischemic heart disease and other diseases of the circulatory system: Secondary | ICD-10-CM | POA: Diagnosis not present

## 2022-05-04 DIAGNOSIS — R9431 Abnormal electrocardiogram [ECG] [EKG]: Secondary | ICD-10-CM | POA: Diagnosis not present

## 2022-05-04 DIAGNOSIS — R188 Other ascites: Secondary | ICD-10-CM | POA: Diagnosis present

## 2022-05-04 DIAGNOSIS — Y95 Nosocomial condition: Secondary | ICD-10-CM | POA: Diagnosis present

## 2022-05-04 DIAGNOSIS — Z9221 Personal history of antineoplastic chemotherapy: Secondary | ICD-10-CM | POA: Diagnosis not present

## 2022-05-04 DIAGNOSIS — Z87891 Personal history of nicotine dependence: Secondary | ICD-10-CM

## 2022-05-04 DIAGNOSIS — Z7189 Other specified counseling: Secondary | ICD-10-CM | POA: Diagnosis not present

## 2022-05-04 DIAGNOSIS — Z880 Allergy status to penicillin: Secondary | ICD-10-CM

## 2022-05-04 DIAGNOSIS — A419 Sepsis, unspecified organism: Secondary | ICD-10-CM | POA: Diagnosis present

## 2022-05-04 DIAGNOSIS — Z825 Family history of asthma and other chronic lower respiratory diseases: Secondary | ICD-10-CM

## 2022-05-04 DIAGNOSIS — Z800 Family history of malignant neoplasm of digestive organs: Secondary | ICD-10-CM

## 2022-05-04 DIAGNOSIS — Z20822 Contact with and (suspected) exposure to covid-19: Secondary | ICD-10-CM | POA: Diagnosis present

## 2022-05-04 DIAGNOSIS — Z8051 Family history of malignant neoplasm of kidney: Secondary | ICD-10-CM

## 2022-05-04 DIAGNOSIS — J441 Chronic obstructive pulmonary disease with (acute) exacerbation: Secondary | ICD-10-CM | POA: Diagnosis not present

## 2022-05-04 DIAGNOSIS — Z888 Allergy status to other drugs, medicaments and biological substances status: Secondary | ICD-10-CM

## 2022-05-04 DIAGNOSIS — Z7901 Long term (current) use of anticoagulants: Secondary | ICD-10-CM

## 2022-05-04 DIAGNOSIS — J9622 Acute and chronic respiratory failure with hypercapnia: Secondary | ICD-10-CM | POA: Diagnosis present

## 2022-05-04 DIAGNOSIS — Z833 Family history of diabetes mellitus: Secondary | ICD-10-CM

## 2022-05-04 DIAGNOSIS — I4892 Unspecified atrial flutter: Secondary | ICD-10-CM | POA: Diagnosis present

## 2022-05-04 DIAGNOSIS — F419 Anxiety disorder, unspecified: Secondary | ICD-10-CM | POA: Diagnosis present

## 2022-05-04 DIAGNOSIS — Z923 Personal history of irradiation: Secondary | ICD-10-CM

## 2022-05-04 DIAGNOSIS — R918 Other nonspecific abnormal finding of lung field: Secondary | ICD-10-CM

## 2022-05-04 DIAGNOSIS — Z902 Acquired absence of lung [part of]: Secondary | ICD-10-CM

## 2022-05-04 DIAGNOSIS — K7460 Unspecified cirrhosis of liver: Secondary | ICD-10-CM | POA: Diagnosis present

## 2022-05-04 DIAGNOSIS — Z950 Presence of cardiac pacemaker: Secondary | ICD-10-CM | POA: Diagnosis not present

## 2022-05-04 DIAGNOSIS — D849 Immunodeficiency, unspecified: Secondary | ICD-10-CM | POA: Diagnosis present

## 2022-05-04 DIAGNOSIS — Z79899 Other long term (current) drug therapy: Secondary | ICD-10-CM

## 2022-05-04 DIAGNOSIS — E875 Hyperkalemia: Secondary | ICD-10-CM | POA: Diagnosis not present

## 2022-05-04 LAB — BLOOD GAS, VENOUS
Acid-Base Excess: 4.8 mmol/L — ABNORMAL HIGH (ref 0.0–2.0)
Bicarbonate: 35.1 mmol/L — ABNORMAL HIGH (ref 20.0–28.0)
Drawn by: 7049
FIO2: 100 %
O2 Saturation: 62.4 %
Patient temperature: 36.2
pCO2, Ven: 79 mmHg (ref 44–60)
pH, Ven: 7.25 (ref 7.25–7.43)
pO2, Ven: 35 mmHg (ref 32–45)

## 2022-05-04 LAB — LACTIC ACID, PLASMA
Lactic Acid, Venous: 2.6 mmol/L (ref 0.5–1.9)
Lactic Acid, Venous: 1.3 mmol/L (ref 0.5–1.9)

## 2022-05-04 LAB — MRSA NEXT GEN BY PCR, NASAL: MRSA by PCR Next Gen: NOT DETECTED

## 2022-05-04 LAB — CBC WITH DIFFERENTIAL/PLATELET
Abs Immature Granulocytes: 0.26 10*3/uL — ABNORMAL HIGH (ref 0.00–0.07)
Basophils Absolute: 0.1 10*3/uL (ref 0.0–0.1)
Basophils Relative: 0 %
Eosinophils Absolute: 0 10*3/uL (ref 0.0–0.5)
Eosinophils Relative: 0 %
HCT: 36.7 % (ref 36.0–46.0)
Hemoglobin: 11.7 g/dL — ABNORMAL LOW (ref 12.0–15.0)
Immature Granulocytes: 1 %
Lymphocytes Relative: 2 %
Lymphs Abs: 0.4 10*3/uL — ABNORMAL LOW (ref 0.7–4.0)
MCH: 32 pg (ref 26.0–34.0)
MCHC: 31.9 g/dL (ref 30.0–36.0)
MCV: 100.3 fL — ABNORMAL HIGH (ref 80.0–100.0)
Monocytes Absolute: 1.6 10*3/uL — ABNORMAL HIGH (ref 0.1–1.0)
Monocytes Relative: 7 %
Neutro Abs: 20.3 10*3/uL — ABNORMAL HIGH (ref 1.7–7.7)
Neutrophils Relative %: 90 %
Platelets: 248 10*3/uL (ref 150–400)
RBC: 3.66 MIL/uL — ABNORMAL LOW (ref 3.87–5.11)
RDW: 17.6 % — ABNORMAL HIGH (ref 11.5–15.5)
WBC: 22.6 10*3/uL — ABNORMAL HIGH (ref 4.0–10.5)
nRBC: 0 % (ref 0.0–0.2)

## 2022-05-04 LAB — COMPREHENSIVE METABOLIC PANEL
ALT: 20 U/L (ref 0–44)
AST: 33 U/L (ref 15–41)
Albumin: 2 g/dL — ABNORMAL LOW (ref 3.5–5.0)
Alkaline Phosphatase: 158 U/L — ABNORMAL HIGH (ref 38–126)
Anion gap: 9 (ref 5–15)
BUN: 39 mg/dL — ABNORMAL HIGH (ref 8–23)
CO2: 33 mmol/L — ABNORMAL HIGH (ref 22–32)
Calcium: 9.5 mg/dL (ref 8.9–10.3)
Chloride: 92 mmol/L — ABNORMAL LOW (ref 98–111)
Creatinine, Ser: 0.74 mg/dL (ref 0.44–1.00)
GFR, Estimated: >60 mL/min (ref >60)
Glucose, Bld: 203 mg/dL — ABNORMAL HIGH (ref 70–99)
Potassium: 5.1 mmol/L (ref 3.5–5.1)
Sodium: 134 mmol/L — ABNORMAL LOW (ref 135–145)
Total Bilirubin: 2 mg/dL — ABNORMAL HIGH (ref 0.3–1.2)
Total Protein: 6 g/dL — ABNORMAL LOW (ref 6.5–8.1)

## 2022-05-04 LAB — BLOOD GAS, ARTERIAL
Acid-Base Excess: 9.2 mmol/L — ABNORMAL HIGH (ref 0.0–2.0)
Acid-Base Excess: 7.4 mmol/L — ABNORMAL HIGH (ref 0.0–2.0)
Bicarbonate: 37.8 mmol/L — ABNORMAL HIGH (ref 20.0–28.0)
Bicarbonate: 36.1 mmol/L — ABNORMAL HIGH (ref 20.0–28.0)
Drawn by: 38235
Drawn by: 38235
FIO2: 100 %
FIO2: 100 %
O2 Saturation: 96 %
O2 Saturation: 96.3 %
Patient temperature: 37
Patient temperature: 37
pCO2 arterial: 70 mmHg (ref 32–48)
pCO2 arterial: 70 mmHg (ref 32–48)
pH, Arterial: 7.34 — ABNORMAL LOW (ref 7.35–7.45)
pH, Arterial: 7.32 — ABNORMAL LOW (ref 7.35–7.45)
pO2, Arterial: 73 mmHg — ABNORMAL LOW (ref 83–108)
pO2, Arterial: 77 mmHg — ABNORMAL LOW (ref 83–108)

## 2022-05-04 LAB — HIV ANTIBODY (ROUTINE TESTING W REFLEX): HIV Screen 4th Generation wRfx: NONREACTIVE

## 2022-05-04 LAB — TROPONIN I (HIGH SENSITIVITY)
Troponin I (High Sensitivity): 16 ng/L (ref <18)
Troponin I (High Sensitivity): 14 ng/L (ref <18)

## 2022-05-04 LAB — BRAIN NATRIURETIC PEPTIDE: B Natriuretic Peptide: 430 pg/mL — ABNORMAL HIGH (ref 0.0–100.0)

## 2022-05-04 LAB — AMMONIA: Ammonia: 46 umol/L — ABNORMAL HIGH (ref 9–35)

## 2022-05-04 LAB — SARS CORONAVIRUS 2 BY RT PCR: SARS Coronavirus 2 by RT PCR: NEGATIVE

## 2022-05-04 MED ORDER — CHLORHEXIDINE GLUCONATE CLOTH 2 % EX PADS
6.0000 | MEDICATED_PAD | Freq: Every day | CUTANEOUS | Status: DC
  Administered 2022-05-04 – 2022-05-05 (×2): 6 via TOPICAL

## 2022-05-04 MED ORDER — SODIUM CHLORIDE 0.9 % IV SOLN
2.0000 g | Freq: Once | INTRAVENOUS | Status: AC
  Administered 2022-05-04: 2 g via INTRAVENOUS
  Filled 2022-05-04: qty 10

## 2022-05-04 MED ORDER — IPRATROPIUM-ALBUTEROL 0.5-2.5 (3) MG/3ML IN SOLN: 3.0000 mL | Freq: Four times a day (QID) | RESPIRATORY_TRACT | Status: DC

## 2022-05-04 MED ORDER — IPRATROPIUM-ALBUTEROL 0.5-2.5 (3) MG/3ML IN SOLN
3.0000 mL | Freq: Four times a day (QID) | RESPIRATORY_TRACT | Status: DC
  Administered 2022-05-04 – 2022-05-06 (×9): 3 mL via RESPIRATORY_TRACT
  Filled 2022-05-04 (×9): qty 3

## 2022-05-04 MED ORDER — LACTULOSE 10 GM/15ML PO SOLN
10.0000 g | Freq: Every day | ORAL | Status: DC
  Administered 2022-05-04 – 2022-05-05 (×2): 10 g via ORAL
  Filled 2022-05-04 (×2): qty 30

## 2022-05-04 MED ORDER — LEVALBUTEROL HCL 1.25 MG/0.5ML IN NEBU
INHALATION_SOLUTION | RESPIRATORY_TRACT | Status: AC
  Administered 2022-05-04: 1.25 mg
  Filled 2022-05-04: qty 0.5

## 2022-05-04 MED ORDER — ALBUTEROL SULFATE (2.5 MG/3ML) 0.083% IN NEBU
2.5000 mg | INHALATION_SOLUTION | RESPIRATORY_TRACT | Status: DC | PRN
  Administered 2022-05-04: 2.5 mg via RESPIRATORY_TRACT
  Filled 2022-05-04: qty 3

## 2022-05-04 MED ORDER — FUROSEMIDE 10 MG/ML IJ SOLN
20.0000 mg | Freq: Every day | INTRAMUSCULAR | Status: DC
  Filled 2022-05-04: qty 2

## 2022-05-04 MED ORDER — GUAIFENESIN ER 600 MG PO TB12
600.0000 mg | ORAL_TABLET | Freq: Two times a day (BID) | ORAL | Status: DC
  Administered 2022-05-05: 600 mg via ORAL
  Filled 2022-05-04: qty 1

## 2022-05-04 MED ORDER — CARVEDILOL 3.125 MG PO TABS
3.1250 mg | ORAL_TABLET | Freq: Two times a day (BID) | ORAL | Status: DC
  Filled 2022-05-04: qty 1

## 2022-05-04 MED ORDER — RIVAROXABAN 20 MG PO TABS
20.0000 mg | ORAL_TABLET | Freq: Every day | ORAL | Status: DC
  Administered 2022-05-04: 20 mg via ORAL
  Filled 2022-05-04 (×2): qty 1

## 2022-05-04 MED ORDER — ALBUTEROL SULFATE HFA 108 (90 BASE) MCG/ACT IN AERS
2.0000 | INHALATION_SPRAY | RESPIRATORY_TRACT | Status: DC | PRN
Start: 2022-05-04 — End: 2022-05-04

## 2022-05-04 MED ORDER — VANCOMYCIN HCL 1250 MG/250ML IV SOLN
1250.0000 mg | INTRAVENOUS | Status: DC
  Administered 2022-05-05: 1250 mg via INTRAVENOUS
  Filled 2022-05-04: qty 250

## 2022-05-04 MED ORDER — SODIUM CHLORIDE 0.9 % IV SOLN
1.0000 g | Freq: Three times a day (TID) | INTRAVENOUS | Status: DC
  Administered 2022-05-04 – 2022-05-05 (×3): 1 g via INTRAVENOUS
  Filled 2022-05-04 (×7): qty 5

## 2022-05-04 MED ORDER — MORPHINE SULFATE (PF) 2 MG/ML IV SOLN
2.0000 mg | Freq: Once | INTRAVENOUS | Status: AC
  Administered 2022-05-04: 2 mg via INTRAVENOUS
  Filled 2022-05-04: qty 1

## 2022-05-04 MED ORDER — DILTIAZEM HCL ER COATED BEADS 240 MG PO CP24
240.0000 mg | ORAL_CAPSULE | Freq: Every day | ORAL | Status: DC
  Filled 2022-05-04: qty 1

## 2022-05-04 MED ORDER — ALPRAZOLAM 0.25 MG PO TABS: 0.2500 mg | ORAL_TABLET | Freq: Three times a day (TID) | ORAL | Status: DC | PRN

## 2022-05-04 MED ORDER — VANCOMYCIN HCL 1250 MG/250ML IV SOLN
1250.0000 mg | Freq: Once | INTRAVENOUS | Status: AC
  Administered 2022-05-04: 1250 mg via INTRAVENOUS
  Filled 2022-05-04: qty 250

## 2022-05-04 MED ORDER — SODIUM CHLORIDE 0.9 % IV SOLN: INTRAVENOUS | Status: DC

## 2022-05-04 MED ORDER — IPRATROPIUM BROMIDE 0.02 % IN SOLN
RESPIRATORY_TRACT | Status: AC
  Administered 2022-05-04: 0.5 mg
  Filled 2022-05-04: qty 2.5

## 2022-05-04 MED ORDER — SPIRONOLACTONE 25 MG PO TABS
25.0000 mg | ORAL_TABLET | Freq: Every day | ORAL | Status: DC
  Administered 2022-05-05: 25 mg via ORAL
  Filled 2022-05-04: qty 1

## 2022-05-04 MED ORDER — IOHEXOL 350 MG/ML SOLN
75.0000 mL | Freq: Once | INTRAVENOUS | Status: AC | PRN
  Administered 2022-05-04: 75 mL via INTRAVENOUS

## 2022-05-04 MED ORDER — FUROSEMIDE 10 MG/ML IJ SOLN
20.0000 mg | Freq: Two times a day (BID) | INTRAMUSCULAR | Status: DC
  Administered 2022-05-04 – 2022-05-05 (×3): 20 mg via INTRAVENOUS
  Filled 2022-05-04 (×3): qty 2

## 2022-05-04 NOTE — Assessment & Plan Note (Signed)
Continue diltiazem and Xarelto

## 2022-05-04 NOTE — Progress Notes (Signed)
Per the daughters request, please have palliative care  and the social worker call her Lynlee Stratton  [phone]) tomorrow to discuss the family's options.

## 2022-05-04 NOTE — ED Provider Notes (Addendum)
[facility] EMERGENCY DEPARTMENT Provider Note   CSN: [id number] Arrival date & time: 05/07/2022  0115     History  Chief Complaint  Patient presents with   Shortness of Breath    Nancy Hodges is a 76 y.o. female.  Patient is a 76 year old female with past medical history of paroxysmal atrial fibrillation, lung cancer, COPD, community-acquired pneumonia.  Patient sent from her extended care facility for evaluation of low oxygen saturations.  From what I am told, her saturations began to drop earlier today.  The nurses there received in order to increase her oxygen from 2 L to 4 L, however saturations were still in the upper 80s.  At that point she was transported here to be seen.  Patient is somewhat somnolent and adds little additional history.  She denies to me she is having any pain.  The history is provided by the patient.       Home Medications Prior to Admission medications   Medication Sig Start Date End Date Taking? Authorizing Provider  acetaminophen (TYLENOL) 325 MG tablet Take 2 tablets (650 mg total) by mouth every 4 (four) hours as needed for moderate pain, fever or headache. 04/05/22   Johnson, Clanford L, MD  albuterol (VENTOLIN HFA) 108 (90 Base) MCG/ACT inhaler Inhale 2 puffs into the lungs every 4 (four) hours as needed for wheezing or shortness of breath. 04/05/22   Johnson, Clanford L, MD  ALPRAZolam (XANAX) 0.25 MG tablet Take 1 tablet (0.25 mg total) by mouth 3 (three) times daily as needed for anxiety. 04/05/22   Johnson, Clanford L, MD  azelastine (ASTELIN) 0.1 % nasal spray Place 1 spray into both nostrils 2 (two) times daily as needed for rhinitis or allergies. Use in each nostril as directed 04/05/22   Irwin Brakeman L, MD  calcium carbonate (TUMS - DOSED IN MG ELEMENTAL CALCIUM) 500 MG chewable tablet Chew 1 tablet by mouth 2 (two) times daily as needed for indigestion or heartburn.     [provider]  CARTIA XT 240 MG 24 hr capsule TAKE 1 CAPSULE  BY MOUTH DAILY 03/28/22   Allred, Jeneen Rinks, MD  dimenhyDRINATE (DRAMAMINE) 50 MG tablet Take 12.5 mg by mouth every 8 (eight) hours as needed for dizziness.    [provider]  ipratropium-albuterol (DUONEB) 0.5-2.5 (3) MG/3ML SOLN Take 3 mLs by nebulization 2 (two) times daily. 04/05/22   Johnson, Clanford L, MD  lidocaine-prilocaine (EMLA) cream APPLY TOPICALLY TO TO THE AFFECTED AREA AS NEEDED Patient taking differently: Apply 1 Application topically as needed (for pain). 08/24/19   Curt Bears, MD  ondansetron (ZOFRAN-ODT) 4 MG disintegrating tablet Take 1 tablet (4 mg total) by mouth every 8 (eight) hours as needed for nausea or vomiting. 03/24/22   Loni Beckwith, PA-C  Polyvinyl Alcohol-Povidone (REFRESH OP) Place 1 drop into both eyes daily as needed (dry eyes).    [provider]  rivaroxaban (XARELTO) 20 MG TABS tablet Take 1 tablet (20 mg total) by mouth daily with supper. 04/05/22   Johnson, Clanford L, MD      Allergies    Chlorpheniramine-dm, Prednisone, Rofecoxib, Septra [sulfamethoxazole-trimethoprim], Doxycycline monohydrate, Fluticasone, Molds & smuts, Meclizine, and Penicillins    Review of Systems   Review of Systems  Unable to perform ROS: Acuity of condition    Physical Exam Updated Vital Signs BP (!) 133/51 (BP Location: Right Arm)   Pulse 99   Temp 97.8 F (36.6 C) (Axillary)   Resp (!) 22  Ht 5\' 2"  (1.575 m)   Wt 62 kg   SpO2 (!) 80%   BMI 25.00 kg/m  Physical Exam Vitals and nursing note reviewed.  Constitutional:      General: She is not in acute distress.    Appearance: She is well-developed. She is not diaphoretic.  HENT:     Head: Normocephalic and atraumatic.  Cardiovascular:     Rate and Rhythm: Normal rate and regular rhythm.     Heart sounds: No murmur heard.    No friction rub. No gallop.  Pulmonary:     Effort: Pulmonary effort is normal. No respiratory distress.     Breath sounds: Examination of the right-lower field  reveals rales. Examination of the left-lower field reveals rales. Rales present. No wheezing.  Abdominal:     General: Bowel sounds are normal. There is no distension.     Palpations: Abdomen is soft.     Tenderness: There is no abdominal tenderness.  Musculoskeletal:        General: Normal range of motion.     Cervical back: Normal range of motion and neck supple.     Right lower leg: No edema.     Left lower leg: No edema.  Skin:    General: Skin is warm and dry.  Neurological:     General: No focal deficit present.     Mental Status: She is alert and oriented to person, place, and time.     ED Results / Procedures / Treatments   Labs (all labs ordered are listed, but only abnormal results are displayed) Labs Reviewed  CULTURE, BLOOD (ROUTINE X 2)  CULTURE, BLOOD (ROUTINE X 2)  COMPREHENSIVE METABOLIC PANEL  CBC WITH DIFFERENTIAL/PLATELET  LACTIC ACID, PLASMA  LACTIC ACID, PLASMA  BRAIN NATRIURETIC PEPTIDE  TROPONIN I (HIGH SENSITIVITY)    EKG EKG Interpretation  Date/Time:  Sunday May 04 2022 01:26:05 EDT Ventricular Rate:  96 PR Interval:    QRS Duration: 97 QT Interval:  327 QTC Calculation: 414 R Axis:   58 Text Interpretation: Atrial fibrillation Multiple ventricular premature complexes Low voltage, precordial leads Confirmed by Veryl Speak [phone]) on 05/22/2022 1:47:48 AM  Radiology DG Chest Portable 1 View  Result Date: 05/13/2022 CLINICAL DATA:  Shortness of breath EXAM: PORTABLE CHEST 1 VIEW COMPARISON:  04/03/2019 FINDINGS: Cardiac shadow is stable. Pacing device is again seen. Right chest wall port and loop recorder are again noted. Lungs are well aerated bilaterally. Persistent opacities are noted in the bases bilaterally similar to that seen on prior exam although new patchy opacities are noted throughout both lungs particularly in the left upper lobe likely representing more acute infiltrate. IMPRESSION: Significant increase in airspace opacities  particularly in the left upper lobe as described. Electronically Signed   By: Inez Catalina M.D.   On: 05/31/2022 01:38    Procedures Procedures    Medications Ordered in ED Medications  albuterol (VENTOLIN HFA) 108 (90 Base) MCG/ACT inhaler 2 puff (has no administration in time range)  levalbuterol (XOPENEX) 1.25 MG/0.5ML nebulizer solution (has no administration in time range)  ipratropium (ATROVENT) 0.02 % nebulizer solution (has no administration in time range)    ED Course/ Medical Decision Making/ A&P  This patient presents to the ED for concern of shortness of breath and low oxygen saturations, this involves an extensive number of treatment options, and is a complaint that carries with it a high risk of complications and morbidity.  The differential diagnosis includes pneumonia, pulmonary embolism, pneumothorax,  congestive heart failure   Co morbidities that complicate the patient evaluation  Recent admission for pneumonia   Additional history obtained:  No additional history or external records needed   Lab Tests:  I Ordered, and personally interpreted labs.  The pertinent results include: CBC showing white count of 22,000, but is otherwise unremarkable as is metabolic panel, troponin.  ABG obtained reveals a PCO2 of 70.   Imaging Studies ordered:  I ordered imaging studies including chest x-ray I independently visualized and interpreted imaging which showed worsening infiltrates I agree with the radiologist interpretation   Cardiac Monitoring: / EKG:  The patient was maintained on a cardiac monitor.  I personally viewed and interpreted the cardiac monitored which showed an underlying rhythm of: Sinus rhythm   Consultations Obtained:  I requested consultation with the hospitalist,  and discussed lab and imaging findings as well as pertinent plan - they recommend: Admission   Problem List / ED Course / Critical interventions / Medication management  Patient  is a 76 year old female presenting with shortness of breath and low oxygen saturations.  She was recently admitted for pneumonia, and discharged to a rehab facility.  She began dropping her oxygen saturations throughout the day and despite increasing her oxygen, she remained hypoxic.  Patient arrived here on a nonrebreather with saturations in the upper 80s.  She seems somewhat sluggish and frail, then was placed on BiPAP.  After an hour on BiPAP, she became more alert, however her ABG did not change. At this point, hypoxia appears to be related to pneumonia.  She has received vancomycin and aztreonam to treat for HCAP as she is penicillin allergic.  Patient to be admitted to the hospitalist service.   I have reviewed the patients home medicines and have made adjustments as needed   Social Determinants of Health:  None   Test / Admission - Considered:  Patient is hypoxic and in some respiratory distress.  She will require admission.  CRITICAL CARE Performed by: Veryl Speak Total critical care time: 40 minutes Critical care time was exclusive of separately billable procedures and treating other patients. Critical care was necessary to treat or prevent imminent or life-threatening deterioration. Critical care was time spent personally by me on the following activities: development of treatment plan with patient and/or surrogate as well as nursing, discussions with consultants, evaluation of patient's response to treatment, examination of patient, obtaining history from patient or surrogate, ordering and performing treatments and interventions, ordering and review of laboratory studies, ordering and review of radiographic studies, pulse oximetry and re-evaluation of patient's condition.   Final Clinical Impression(s) / ED Diagnoses Final diagnoses:  None    Rx / DC Orders ED Discharge Orders     None         Veryl Speak, MD 05/30/2022 2774    Veryl Speak, MD 05/23/2022 [phone]

## 2022-05-04 NOTE — Progress Notes (Addendum)
Pharmacy Antibiotic Note  Nancy Hodges is a 76 y.o. female admitted on 05/25/2022 with pneumonia.  Pharmacy has been consulted for Vancomycin dosing. Given Azactam x1 in ED.   Plan: Vancomycin 1250mg  IV q24h - cAUC 506 Monitor renal function, culture results, and clinical status Follow-up for continuation of gram negative coverage  Addendum: Consulted to further dose Azactam.  Continue Azactam 1g IV q8h  Height: 5\' 2"  (157.5 cm) Weight: 62 kg (136 lb 11 oz) IBW/kg (Calculated) : 50.1  Temp (24hrs), Avg:97.8 F (36.6 C), Min:97.8 F (36.6 C), Max:97.8 F (36.6 C)  Recent Labs  Lab 05/23/2022 0143  WBC 22.6*  CREATININE 0.74    Estimated Creatinine Clearance: 50.2 mL/min (by C-G formula based on SCr of 0.74 mg/dL).    Allergies  Allergen Reactions   Chlorpheniramine-Dm Anaphylaxis and Other (See Comments)    Pt states BP dropped; developed irregular heartbeat    Prednisone Shortness Of Breath, Palpitations and Other (See Comments)    Double vision. History is not consistent with any allergy. "drove me crazy"   Rofecoxib Shortness Of Breath and Other (See Comments)    Stomach cramps Other reaction(s): shortness of breath   Septra [Sulfamethoxazole-Trimethoprim] Shortness Of Breath   Doxycycline Monohydrate Diarrhea, Nausea And Vomiting and Dermatitis   Fluticasone     Other reaction(s): dizziness   Molds & Smuts    Meclizine Rash   Penicillins Swelling, Rash and Other (See Comments)    DID THE REACTION INVOLVE: Swelling of the face/tongue/throat, SOB, or low BP? No Sudden or severe rash/hives, skin peeling, or the inside of the mouth or nose? Yes Did it require medical treatment? No  When did it last happen?      unknown If all above answers are "NO", may proceed with cephalosporin use.     Antimicrobials this admission: Azactam 10/1 x1 Vanc 10/1 >>  Dose adjustments this admission:  Microbiology results: pending  Thank you for allowing pharmacy to be a  part of this patient's care.  Sloan Leiter, PharmD, BCPS, BCCCP Clinical Pharmacist Please refer to [facility] for [place] numbers 05/31/2022 3:25 AM

## 2022-05-04 NOTE — Progress Notes (Signed)
Having some trouble at times with BiPAP mask obtaining good seal. Have changed to smaller mask on day shift. Have another mask different variety if this becomes a problem. As of now mask works that is being used.

## 2022-05-04 NOTE — Assessment & Plan Note (Signed)
-   Described as somnolence -Opens eyes to voice, but does not respond -Likely due to PCO2 70 -Patient does have a history of hepatic cirrhosis as well so checking ammonia -Continue treatment of HCAP/COPD exacerbation dissipate improvement in mentation

## 2022-05-04 NOTE — ED Triage Notes (Signed)
Pt from [facility] via EMS after staff found pt with O2 sats in 80's on room air around 2330. Staff contacted facility MD who instructed they place pt on O2 @ 4L. And when that did not work- to bring pt to hospital. EMS states they placed pt on 100% NRB and that her sats were 89% and that they gave pt a breathing treatment.

## 2022-05-04 NOTE — Assessment & Plan Note (Signed)
-   History of obstructive bronchitis - PCO2 70 on VBG x2 - Continue BiPAP - Continue albuterol and DuoNeb - Holding Solu-Medrol as patient has not listed as an allergy - Continue to monitor

## 2022-05-04 NOTE — Progress Notes (Signed)
Changed mask to different type.

## 2022-05-04 NOTE — H&P (Signed)
History and Physical    Patient: Nancy Hodges VZD:[id number] DOB: [birth year] DOA: 06/02/2022 DOS: the patient was seen and examined on 06/02/2022 PCP: [facility]  Patient coming from: SNF  Chief Complaint:  Chief Complaint  Patient presents with   Shortness of Breath   HPI: Nancy Hodges is a 76 y.o. female with medical history significant of atrial flutter, anxiety, diverticulosis, GERD, lung cancer, persistent atrial fibrillation, presence of pacemaker, and more presents to the ED with a chief complaint of hypoxia.  Apparently when patient was last discharged she was a facility for rehab.  Throughout the day today the facility had reported that she did not hypoxic.  They were administering oxygen, but she remained hypoxic.  When she came in the ED she was on a nonrebreather and satting in the mid 80s.  In the ED they put her on BiPAP.  ED provider spoke with daughter, I believe, who said that patient would want to be intubated if it was going to be short-term, but would not want to be on life support for long-term.  Unfortunately, patient is somnolent not able to give me any history.  She opens her eyes to voice, but does not respond.  She is currently on BiPAP which was adjusted to 16/7 at the time of admission.  Will admit to stepdown. Review of Systems: unable to review all systems due to the inability of the patient to answer questions. Past Medical History:  Diagnosis Date   Allergic rhinitis    Anxiety    Arthritis    Asthma    Atrial flutter ([place])    Cholelithiases 09/26/2015   Diverticulosis    Encounter for antineoplastic immunotherapy 12/09/2016   GERD (gastroesophageal reflux disease)    H/O hiatal hernia    History of blood transfusion    History of chemotherapy    History of kidney stones    History of radiation therapy    Right and left lung- 11/18/21-11/28/21- Dr. Gery Pray   Internal hemorrhoid    Lung cancer [facility])    a.  Stage IB non-small cell carcinoma, s/p R VATS, wedge resection, RU lobectomy 10/2012.   Persistent atrial fibrillation (HCC)    a. anticoagulated with Xarelto   Pneumonia 06/2016   [facility]   Presence of permanent cardiac pacemaker    Pulmonary nodule    Tachycardia-bradycardia syndrome ([place])    a. 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   Past Surgical History:  Procedure Laterality Date   CARDIOVERSION N/A 02/19/2018   Procedure: CARDIOVERSION;  Surgeon: Sanda Klein, MD;  Location: [facility] ENDOSCOPY;  Service: Cardiovascular;  Laterality: N/A;   CARDIOVERSION N/A 04/30/2018   Procedure: CARDIOVERSION;  Surgeon: Thayer Headings, MD;  Location: [facility] ENDOSCOPY;  Service: Cardiovascular;  Laterality: N/A;   CATARACT EXTRACTION W/PHACO Right 08/14/2016   Procedure: CATARACT EXTRACTION PHACO AND INTRAOCULAR LENS PLACEMENT RIGHT EYE CDE 10.53;  Surgeon: Tonny Branch, MD;  Location: [facility] ORS;  Service: Ophthalmology;  Laterality: Right;  right   CATARACT EXTRACTION W/PHACO Left 09/25/2016   Procedure: CATARACT EXTRACTION PHACO AND INTRAOCULAR LENS PLACEMENT (IOC);  Surgeon: Tonny Branch, MD;  Location: [facility] ORS;  Service: Ophthalmology;  Laterality: Left;  left CDE 8.81   COLONOSCOPY     [facility]: cattered sigmoid diverticula, internal hemorrhoids, sessile polyp in ascending and mid transverse colon (tubular adenoma).    colonoscopy with polypectomy  2015   ESOPHAGOGASTRODUODENOSCOPY     [facility]: bile reflux in stomach, mild gastritis,  hiatal hernia   ESOPHAGOGASTRODUODENOSCOPY N/A 01/31/2016   Procedure: ESOPHAGOGASTRODUODENOSCOPY (EGD);  Surgeon: [name], MD;  Location: [facility] ENDO [facility];  Service: Endoscopy;  Laterality: N/A;  1200   IR FLUORO GUIDE PORT INSERTION RIGHT  06/30/2017   IR US GUIDE VASC ACCESS RIGHT  06/30/2017   LOBECTOMY Right 10/27/2012   Procedure: LOBECTOMY;  Surgeon: [name], MD;  Location: [facility];  Service: Thoracic;  Laterality: Right;   RIGHT UPPER LOBECTOMY  & Node Dissection   LYMPH NODE BIOPSY Right 05/01/2014   Procedure: LYMPH NODE BIOPSY, Right Axillary;  Surgeon: [name], MD;  Location: [facility];  Service: Thoracic;  Laterality: Right;   PACEMAKER IMPLANT N/A 10/30/2017   SJM Assurity MRI PPM implanted by Dr [name] once leadless pacemaker battery depleted   PARTIAL HYSTERECTOMY     PERMANENT PACEMAKER INSERTION N/A 09/14/2012   Nanostim (SJM) leadless pacemaker (LEADLESS II STUDY PATIENT) implanted by Dr [name], no longer functioning, device abandoned.   VIDEO ASSISTED THORACOSCOPY (VATS)/WEDGE RESECTION Right 10/27/2012   Procedure: VIDEO ASSISTED THORACOSCOPY (VATS),RGHT UPPER LOBE LUNG WEDGE RESECTION;  Surgeon: [name], MD;  Location: [facility];  Service: Thoracic;  Laterality: Right;   Social History:  reports that she quit smoking about 26 years ago. Her smoking use included cigarettes. She has a 80.00 pack-year smoking history. She has never used smokeless tobacco. She reports that she does not drink alcohol and does not use drugs.  Allergies  Allergen Reactions   Chlorpheniramine-Dm Anaphylaxis and Other (See Comments)    Pt states BP dropped; developed irregular heartbeat    Prednisone Shortness Of Breath, Palpitations and Other (See Comments)    Double vision. History is not consistent with any allergy. "drove me crazy"   Rofecoxib Shortness Of Breath and Other (See Comments)    Stomach cramps Other reaction(s): shortness of breath   Septra [Sulfamethoxazole-Trimethoprim] Shortness Of Breath   Doxycycline Monohydrate Diarrhea, Nausea And Vomiting and Dermatitis   Fluticasone     Other reaction(s): dizziness   Molds & Smuts    Meclizine Rash   Penicillins Swelling, Rash and Other (See Comments)    DID THE REACTION INVOLVE: Swelling of the face/tongue/throat, SOB, or low BP? No Sudden or severe rash/hives, skin peeling, or the inside of the mouth or nose? Yes Did it require medical treatment? No  When did it  last happen?      unknown If all above answers are "NO", may proceed with cephalosporin use.     Family History  Problem Relation Age of Onset   Heart disease Mother    Diabetes Mother    Kidney cancer Mother    Asthma Father    Heart disease Father    Pancreatic cancer Brother    Diabetes Sister    Lung cancer Sister    Diabetes Brother    Heart attack Son    Colon cancer Neg Hx    Breast cancer Neg Hx     Prior to Admission medications   Medication Sig Start Date End Date Taking? Authorizing Provider  acetaminophen (TYLENOL) 325 MG tablet Take 2 tablets (650 mg total) by mouth every 4 (four) hours as needed for moderate pain, fever or headache. 04/05/22   Johnson, Clanford L, MD  albuterol (VENTOLIN HFA) 108 (90 Base) MCG/ACT inhaler Inhale 2 puffs into the lungs every 4 (four) hours as needed for wheezing or shortness of breath. 04/05/22   Murlean Iba, MD  ALPRAZolam Duanne Moron) 0.25 MG  tablet Take 1 tablet (0.25 mg total) by mouth 3 (three) times daily as needed for anxiety. 04/05/22   Johnson, Clanford L, MD  azelastine (ASTELIN) 0.1 % nasal spray Place 1 spray into both nostrils 2 (two) times daily as needed for rhinitis or allergies. Use in each nostril as directed 04/05/22   Irwin Brakeman L, MD  calcium carbonate (TUMS - DOSED IN MG ELEMENTAL CALCIUM) 500 MG chewable tablet Chew 1 tablet by mouth 2 (two) times daily as needed for indigestion or heartburn.     [provider]  CARTIA XT 240 MG 24 hr capsule TAKE 1 CAPSULE BY MOUTH DAILY 03/28/22   Allred, Jeneen Rinks, MD  dimenhyDRINATE (DRAMAMINE) 50 MG tablet Take 12.5 mg by mouth every 8 (eight) hours as needed for dizziness.    [provider]  ipratropium-albuterol (DUONEB) 0.5-2.5 (3) MG/3ML SOLN Take 3 mLs by nebulization 2 (two) times daily. 04/05/22   Johnson, Clanford L, MD  lidocaine-prilocaine (EMLA) cream APPLY TOPICALLY TO TO THE AFFECTED AREA AS NEEDED Patient taking differently: Apply 1 Application  topically as needed (for pain). 08/24/19   Curt Bears, MD  ondansetron (ZOFRAN-ODT) 4 MG disintegrating tablet Take 1 tablet (4 mg total) by mouth every 8 (eight) hours as needed for nausea or vomiting. 03/24/22   Loni Beckwith, PA-C  Polyvinyl Alcohol-Povidone (REFRESH OP) Place 1 drop into both eyes daily as needed (dry eyes).    [provider]  rivaroxaban (XARELTO) 20 MG TABS tablet Take 1 tablet (20 mg total) by mouth daily with supper. 04/05/22   Murlean Iba, MD    Physical Exam: Vitals:   05/21/2022 0315 05/24/2022 0330 05/22/2022 0406 05/27/2022 0500  BP: 107/61 (!) 122/54 (!) 119/45 (!) 114/49  Pulse: 91 94 99 90  Resp: 19 19 (!) 28 13  Temp:      TempSrc:      SpO2: 94% 94% 95% 96%  Weight:      Height:       1.  General: Supine in bed, head of bed elevated, on BiPAP   2. Psychiatric: Somnolent, opens eyes to voice but does not respond, does not follow commands   3. Neurologic: Not responding and not following commands at this time   4. HEENMT:  Head is atraumatic, normocephalic, pupils reactive to light, neck is supple, trachea is midline, mucous membranes are moist   5. Respiratory : Lungs are diminished on the left but otherwise, clear to auscultation bilaterally without wheezing, rhonchi, rales, no cyanosis, no increase in work of breathing or accessory muscle use, BiPAP in place with settings 16/7   6. Cardiovascular : Heart rate normal, rhythm is regular, no rubs or gallops, no peripheral edema, peripheral pulses palpated   7. Gastrointestinal:  Abdomen is soft, nondistended, nontender to palpation bowel sounds active, no masses or organomegaly palpated   8. Skin:  Skin is warm, dry and intact without rashes, acute lesions, or ulcers on limited exam   9.Musculoskeletal:  No acute deformities or trauma, no asymmetry in tone, no peripheral edema, peripheral pulses palpated, no tenderness to palpation in the extremities  Data Reviewed: In  the ED Temp 97.8, heart rate 91-99, respiratory rate 19-31, blood pressure 107/45-133/61, satting 80-95% initially the 80s on nonrebreather 95% is on BiPAP Blood gases almost identical 2 hours apart with the most recent being a pH of 7.32, PCO2 70, PO2 77 Leukocytosis 22.6, hemoglobin 11.7, platelets 248 Chemistry is unremarkable BNP is 430,  troponin 16, 14  Blood cultures pending Chest x-ray shows significant increase in airspace opacities on the left upper lobe EKG shows A-fib, heart rate 96, QTc 414 without acute ST changes Patient was given albuterol, aztreonam, vancomycin, morphine, ED Admission was requested for acute respiratory failure with hypoxia secondary to HCAP   Assessment and Plan: * Acute respiratory failure with hypoxia ([place]) - Patient satting in mid 58s on nonrebreather at arrival - Chest x-ray shows a significant increase in airspace opacities particularly in the left upper lobe - Patient was recently discharged from the hospitalization September second with pneumonia that was treated with IV cefepime and Levaquin - At that time her blood cultures did not grow anything and it does not appear that there was a sputum culture - 16, 14 - EKG shows A-fib without acute ST changes - See treatment plan for pneumonia  Sepsis ([place]) - Heart rate 89, respiratory rate 31, white blood cell count 22.6, source is pneumonia, with acute metabolic encephalopathy - Treat with vancomycin and aztreonam - Lactic acid was 2.6 - Continue gentle IV hydration - Cultures pending -Continue to monitor  COPD with acute exacerbation (HCC) - History of obstructive bronchitis - PCO2 70 on VBG x2 - Continue BiPAP - Continue albuterol and DuoNeb - Holding Solu-Medrol as patient has not listed as an allergy - Continue to monitor  Acute metabolic encephalopathy - Described as somnolence -Opens eyes to voice, but does not respond -Likely due to PCO2 70 -Patient does have a history of hepatic  cirrhosis as well so checking ammonia -Continue treatment of HCAP/COPD exacerbation dissipate improvement in mentation  HCAP (healthcare-associated pneumonia) - Patient recently discharge - Continue aztreonam and vancomycin per allergic order set - Sputum culture pending - Legionella and strep antigens pending - Blood culture pending - Closely monitor  Paroxysmal atrial fibrillation (HCC) Continue diltiazem and Xarelto      Advance Care Planning:   Code Status: Prior full  Consults: None  Family Communication: ED provider spoke with daughter who reported that patient would want to be full code  Severity of Illness: The appropriate patient status for this patient is INPATIENT. Inpatient status is judged to be reasonable and necessary in order to provide the required intensity of service to ensure the patient's safety. The patient's presenting symptoms, physical exam findings, and initial radiographic and laboratory data in the context of their chronic comorbidities is felt to place them at high risk for further clinical deterioration. Furthermore, it is not anticipated that the patient will be medically stable for discharge from the hospital within 2 midnights of admission.   * I certify that at the point of admission it is my clinical judgment that the patient will require inpatient hospital care spanning beyond 2 midnights from the point of admission due to high intensity of service, high risk for further deterioration and high frequency of surveillance required.*  Author: Rolla Plate, DO 05/05/2022 6:24 AM  For on call review [email or web address].

## 2022-05-04 NOTE — Assessment & Plan Note (Signed)
-   Patient recently discharge - Continue aztreonam and vancomycin per allergic order set - Sputum culture pending - Legionella and strep antigens pending - Blood culture pending - Closely monitor

## 2022-05-04 NOTE — Progress Notes (Addendum)
PROGRESS NOTE     Nancy Hodges, is a 76 y.o. female, DOB - 16-Feb-1946, MBE:[id number]  Admit date - 05/27/2022   Admitting Physician Rolla Plate, DO  Outpatient Primary MD for the patient is [facility]  LOS - 0  Chief Complaint  Patient presents with   Shortness of Breath        Brief Narrative:  76 y.o. female with medical history significant of atrial flutter, anxiety, diverticulosis, GERD, lung cancer, persistent atrial fibrillation, presence of pacemaker admitted on 05/12/2022 with recurrent Multi-Focal Pneumonia    -Assessment and Plan: 1) Acute respiratory failure with hypercapnia and hypoxia due to Recurrent Bil  Multi-Focal PNA - Patient satting in mid 80s on nonrebreather at arrival -PCO2 was 70 with a pH of 7.32 Patient required Bipap, weaning to nasal cannula CTA chest on 05/17/2022----shows Extensive new interstitial and alveolar densities are seen in both lungs suggesting multifocal pneumonia -Recently treated with IV cefepime and Levaquin -Treat empirically with aztreonam and vancomycin in the setting of penicillin allergy -Continue bronchodilators and mucolytics -Lactic acid improved to 1.3 from 2.6 after IV fluids -Patient is immunocompromised due to underlying lung cancer with prior lung surgery chemoradiation therapy -Get speech evaluation to rule out possible aspiration component  2)Sepsis due to Recurrent Bil  Multi-Focal PNA---POA -Patient met sepsis criteria on admission -Management as above #1 Patient is immunocompromised due to underlying lung cancer with prior lung surgery, chemotherapy and radiation -Legionella and strep antigen requested  3)Liver Cirrhosis with Ascites------?? Etiology, not much of a drinker -No prior history of fatty liver --serum ammonia is 46,, Pt is somewhat sleepy -Give Lactulose -Given Lasix, Aldactone and Coreg -Check PT/INR  4) CHF--??  Systolic versus diastolic -Patient with  bilateral pleural effusions and ascites -Lasix and Aldactone as above -Echo pending  5)COPD with acute exacerbation ([place]) -Secondary to # 1 above  -management as above #1 -Patient apparently is allergic to steroids  6)Acute metabolic encephalopathy -Multifactorial in the setting of hypercapnia and liver cirrhosis with mildly elevated ammonia -Management as above #1  7)Paroxysmal atrial fibrillation (HCC) Continue diltiazem and Xarelto -Coreg added due to liver cirrhosis with with ascites raising concerns for portal hypertension  8)Social/Ethics--- d/w Marcial Pacas (daughter), daughter will let us know code status after she makes her decision -Palliative care consult requested  9)H/o Metastatic non-small cell lung cancer, adenocarcinoma. This was initially diagnosed as stage IB (T2a, N0, M0) in March of 2014, status post right upper lobectomy with lymph node dissection but the patient has evidence for disease recurrence in the right axilla status post resection.  --Patient previously treated with chemotherapy and radiation,  -currently on immunotherapy -Patient sees Dr. Lorna Few  -Total care time is over 53 minutes  Disposition/Need for in-Hospital Stay- patient unable to be discharged at this time due to --acute hypoxic and hypercapnic respiratory failure with metabolic encephalopathy requiring IV antibiotics... And close monitoring including BiPAP  Status is: Inpatient   Disposition: The patient is from: SNF              Anticipated d/c is to: SNF              Anticipated d/c date is: > 3 days              Patient currently is not medically stable to d/c. Barriers: Not Clinically Stable-   Code Status : -  Code Status: Full Code   Family Communication:  d/w Loredana Medellin (daughter)  DVT Prophylaxis  :   - SCDs   SCDs Start: 05/24/2022 0758 rivaroxaban (XARELTO) tablet 20 mg   Lab Results  Component Value Date   PLT 248 05/18/2022    Inpatient  Medications  Scheduled Meds:  Chlorhexidine Gluconate Cloth  6 each Topical Daily   diltiazem  240 mg Oral Daily   furosemide  20 mg Intravenous Q12H   guaiFENesin  600 mg Oral BID   ipratropium-albuterol  3 mL Nebulization Q6H   rivaroxaban  20 mg Oral Q supper   Continuous Infusions:  sodium chloride 125 mL/hr at 05/07/2022 1620   aztreonam 1 g (05/30/2022 1334)   [START ON 05/05/2022] vancomycin     PRN Meds:.albuterol, ALPRAZolam   Anti-infectives (From admission, onward)    Start     Dose/Rate Route Frequency Ordered Stop   05/05/22 0400  vancomycin (VANCOREADY) IVPB 1250 mg/250 mL        1,250 mg 166.7 mL/hr over 90 Minutes Intravenous Every 24 hours 05/07/2022 0330 05/12/22 0359   06/01/2022 1300  aztreonam (AZACTAM) 1 g in sodium chloride 0.9 % 100 mL IVPB        1 g 200 mL/hr over 30 Minutes Intravenous Every 8 hours 05/14/2022 0513     05/29/2022 0330  aztreonam (AZACTAM) 2 g in sodium chloride 0.9 % 100 mL IVPB        2 g 200 mL/hr over 30 Minutes Intravenous  Once 05/26/2022 0323 05/09/2022 0554   05/23/2022 0330  vancomycin (VANCOREADY) IVPB 1250 mg/250 mL        1,250 mg 166.7 mL/hr over 90 Minutes Intravenous  Once 05/19/2022 0328 05/12/2022 0160        Subjective: Nancy Hodges today has no fevers, no emesis,  No chest pain,   - Altered mentation , shortness of breath ,  hypoxia and cough persist  Patient's daughter is at bedside, questions answered  Objective: Vitals:   05/11/2022 1435 05/27/2022 1443 05/21/2022 1500 05/14/2022 1700  BP: (!) 154/83  (!) 152/134 134/88  Pulse: 97 97 93   Resp: (!) 21 (!) 21 (!) 22   Temp:      TempSrc:      SpO2: 92%  95% 90%  Weight:      Height:        Intake/Output Summary (Last 24 hours) at 05/10/2022 1720 Last data filed at 05/17/2022 1507 Gross per 24 hour  Intake 851.03 ml  Output --  Net 851.03 ml   Filed Weights   06/03/2022 0123 05/22/2022 1432  Weight: 62 kg 61 kg    Physical Exam  Gen:-Sleepy,,  in no apparent  distress  HEENT:- [place].AT, No sclera icterus Nose- [place] alternating with BiPAP Neck-Supple Neck,No JVD,.  Lungs-diminished breath sounds with rhonchi  CV- S1, S2 normal, irregular Abd-  +ve B.Sounds, Abd Soft, No tenderness,    Extremity/Skin:- +ve  edema, pedal pulses present  Psych-affect is flat, sleepy,  neuro-generalized weakness no new focal deficits, no tremors  Data Reviewed: I have personally reviewed following labs and imaging studies  CBC: Recent Labs  Lab 05/14/2022 0143  WBC 22.6*  NEUTROABS 20.3*  HGB 11.7*  HCT 36.7  MCV 100.3*  PLT 109   Basic Metabolic Panel: Recent Labs  Lab 05/11/2022 0143  NA 134*  K 5.1  CL 92*  CO2 33*  GLUCOSE 203*  BUN 39*  CREATININE 0.74  CALCIUM 9.5   GFR: Estimated Creatinine Clearance: 49.9 mL/min (by C-G formula based on SCr of  0.74 mg/dL). Liver Function Tests: Recent Labs  Lab 05/09/2022 0143  AST 33  ALT 20  ALKPHOS 158*  BILITOT 2.0*  PROT 6.0*  ALBUMIN 2.0*    Recent Results (from the past 240 hour(s))  Blood culture (routine x 2)     Status: None (Preliminary result)   Collection Time: 05/29/2022  2:38 AM   Specimen: Blood  Result Value Ref Range Status   Specimen Description BLOOD RIGHT FOREARM BOTTLES DRAWN AEROBIC ONLY  Final   Special Requests   Final    Blood Culture results may not be optimal due to an inadequate volume of blood received in culture bottles Performed at [facility], [address], [place], [place] [address]    Culture PENDING  Incomplete   Report Status PENDING  Incomplete  Blood culture (routine x 2)     Status: None (Preliminary result)   Collection Time: 05/25/2022  2:51 AM   Specimen: Blood  Result Value Ref Range Status   Specimen Description   Final    SITE NOT SPECIFIED BOTTLES DRAWN AEROBIC AND ANAEROBIC   Special Requests   Final    Blood Culture adequate volume Performed at [facility], [address] [place], [place] [address]    Culture PENDING  Incomplete   Report  Status PENDING  Incomplete  MRSA Next Gen by PCR, Nasal     Status: None   Collection Time: 05/16/2022  2:27 PM   Specimen: Anterior Nasal Swab  Result Value Ref Range Status   MRSA by PCR Next Gen NOT DETECTED NOT DETECTED Final    Comment: (NOTE) The GeneXpert MRSA Assay (FDA approved for NASAL specimens only), is one component of a comprehensive MRSA colonization surveillance program. It is not intended to diagnose MRSA infection nor to guide or monitor treatment for MRSA infections. Test performance is not FDA approved in patients less than 80 years old. Performed at [facility], [address], [place], [place] [address]   SARS Coronavirus 2 by RT PCR (hospital order, performed in [facility] hospital lab) *cepheid single result test* Anterior Nasal Swab     Status: None   Collection Time: 06/02/2022  2:30 PM   Specimen: Anterior Nasal Swab  Result Value Ref Range Status   SARS Coronavirus 2 by RT PCR NEGATIVE NEGATIVE Final    Comment: (NOTE) SARS-CoV-2 target nucleic acids are NOT DETECTED.  The SARS-CoV-2 RNA is generally detectable in upper and lower respiratory specimens during the acute phase of infection. The lowest concentration of SARS-CoV-2 viral copies this assay can detect is 250 copies / mL. A negative result does not preclude SARS-CoV-2 infection and should not be used as the sole basis for treatment or other patient management decisions.  A negative result may occur with improper specimen collection / handling, submission of specimen other than nasopharyngeal swab, presence of viral mutation(s) within the areas targeted by this assay, and inadequate number of viral copies (<250 copies / mL). A negative result must be combined with clinical observations, patient history, and epidemiological information.  Fact Sheet for Patients:   https://www.patel.info/  Fact Sheet for Healthcare Providers: https://hall.com/  This test  is not yet approved or  cleared by the [place] FDA and has been authorized for detection and/or diagnosis of SARS-CoV-2 by FDA under an Emergency Use Authorization (EUA).  This EUA will remain in effect (meaning this test can be used) for the duration of the COVID-19 declaration under Section 564(b)(1) of the Act, 21 U.S.C.  section 360bbb-3(b)(1), unless the authorization is terminated or revoked sooner.  Performed at [facility], [address], [place], [place] [address]     Radiology Studies: CT Angio Chest Pulmonary Embolism (PE) W or WO Contrast  Result Date: 05/29/2022 CLINICAL DATA:  Chronic dyspnea EXAM: CT ANGIOGRAPHY CHEST WITH CONTRAST TECHNIQUE: Multidetector CT imaging of the chest was performed using the standard protocol during bolus administration of intravenous contrast. Multiplanar CT image reconstructions and MIPs were obtained to evaluate the vascular anatomy. RADIATION DOSE REDUCTION: This exam was performed according to the departmental dose-optimization program which includes automated exposure control, adjustment of the mA and/or kV according to patient size and/or use of iterative reconstruction technique. CONTRAST:  79m OMNIPAQUE IOHEXOL 350 MG/ML SOLN COMPARISON:  Previous CT done on 01/09/2022, chest radiograph done earlier today FINDINGS: Cardiovascular: There are no intraluminal filling defects in central pulmonary artery branches. Evaluation of small peripheral branches is limited by extensive infiltrates and breathing motion artifacts. There is homogeneous enhancement in thoracic aorta. Coronary artery calcifications are seen. Heart is enlarged in size. Pacer leads are noted in place. Mediastinum/Nodes: Enlarged lymph nodes are seen in mediastinum and hilar regions. There is possible edema in mediastinal fat planes. Lungs/Pleura: Extensive new patchy interstitial and alveolar densities are seen in both lungs. Large bilateral pleural effusions are seen, more so on  the left side. There is no pneumothorax. Upper Abdomen: There is nodularity in liver surface suggesting cirrhosis. Gallbladder stones are seen. Moderate ascites is present in the upper abdomen. Musculoskeletal: There is edema in subcutaneous plane suggesting anasarca. Review of the MIP images confirms the above findings. IMPRESSION: There is no evidence of central pulmonary artery embolism. There is no evidence of thoracic aortic dissection. Atherosclerotic changes are noted in thoracic aorta. Coronary artery calcifications are seen. Extensive new interstitial and alveolar densities are seen in both lungs suggesting multifocal pneumonia and possibly pulmonary edema. There is interval appearance of large bilateral pleural effusions, more so on the left side. There is increased density in mediastinum which may suggest lymphadenopathy and possibly edema. Cirrhosis of liver.  Gallbladder stones.  Moderate ascites. Other findings as described in the body of the report. Electronically Signed   By: PElmer PickerM.D.   On: 05/11/2022 10:23   DG Chest Portable 1 View  Result Date: 05/26/2022 CLINICAL DATA:  Shortness of breath EXAM: PORTABLE CHEST 1 VIEW COMPARISON:  04/03/2019 FINDINGS: Cardiac shadow is stable. Pacing device is again seen. Right chest wall port and loop recorder are again noted. Lungs are well aerated bilaterally. Persistent opacities are noted in the bases bilaterally similar to that seen on prior exam although new patchy opacities are noted throughout both lungs particularly in the left upper lobe likely representing more acute infiltrate. IMPRESSION: Significant increase in airspace opacities particularly in the left upper lobe as described. Electronically Signed   By: MInez CatalinaM.D.   On: 06/02/2022 01:38    Scheduled Meds:  Chlorhexidine Gluconate Cloth  6 each Topical Daily   diltiazem  240 mg Oral Daily   furosemide  20 mg Intravenous Q12H   guaiFENesin  600 mg Oral BID    ipratropium-albuterol  3 mL Nebulization Q6H   rivaroxaban  20 mg Oral Q supper   Continuous Infusions:  sodium chloride 125 mL/hr at 05/31/2022 1620   aztreonam 1 g (05/29/2022 1334)   [START ON 05/05/2022] vancomycin       LOS: 0 days    CRoxan HockeyM.D on 05/09/2022 at 5:20  PM  Go to [email or web address] - for contact info  [facility] - Office  [phone]  If 7PM-7AM, please contact night-coverage [email or web address] 05/24/2022, 5:20 PM

## 2022-05-04 NOTE — Assessment & Plan Note (Addendum)
-   Patient satting in mid 71s on nonrebreather at arrival - Chest x-ray shows a significant increase in airspace opacities particularly in the left upper lobe - Patient was recently discharged from the hospitalization September second with pneumonia that was treated with IV cefepime and Levaquin - At that time her blood cultures did not grow anything and it does not appear that there was a sputum culture - 16, 14 - EKG shows A-fib without acute ST changes - See treatment plan for pneumonia

## 2022-05-04 NOTE — Assessment & Plan Note (Signed)
-   Heart rate 89, respiratory rate 31, white blood cell count 22.6, source is pneumonia, with acute metabolic encephalopathy - Treat with vancomycin and aztreonam - Lactic acid was 2.6 - Continue gentle IV hydration - Cultures pending -Continue to monitor

## 2022-05-04 NOTE — TOC Progression Note (Signed)
  Transition of Care [facility]) Screening Note   Patient Details  Name: HEIRESS WILLIAMSON Date of Birth: November 09, 1945   [facility]) CM/SW Contact:    Shade Flood, LCSW Phone Number: 05/16/2022, 11:01 AM    Transition of Care Department [facility]) has reviewed patient and no TOC needs have been identified at this time. We will continue to monitor patient advancement through interdisciplinary progression rounds. If new patient transition needs arise, please place a TOC consult.

## 2022-05-05 ENCOUNTER — Inpatient Hospital Stay (HOSPITAL_COMMUNITY): Payer: Medicare Other

## 2022-05-05 DIAGNOSIS — G9341 Metabolic encephalopathy: Secondary | ICD-10-CM | POA: Diagnosis not present

## 2022-05-05 DIAGNOSIS — J9602 Acute respiratory failure with hypercapnia: Secondary | ICD-10-CM | POA: Diagnosis not present

## 2022-05-05 DIAGNOSIS — R918 Other nonspecific abnormal finding of lung field: Secondary | ICD-10-CM | POA: Diagnosis not present

## 2022-05-05 DIAGNOSIS — J9601 Acute respiratory failure with hypoxia: Secondary | ICD-10-CM | POA: Diagnosis not present

## 2022-05-05 DIAGNOSIS — Z515 Encounter for palliative care: Secondary | ICD-10-CM | POA: Diagnosis not present

## 2022-05-05 DIAGNOSIS — J441 Chronic obstructive pulmonary disease with (acute) exacerbation: Secondary | ICD-10-CM | POA: Diagnosis not present

## 2022-05-05 DIAGNOSIS — R9431 Abnormal electrocardiogram [ECG] [EKG]: Secondary | ICD-10-CM | POA: Diagnosis not present

## 2022-05-05 DIAGNOSIS — I480 Paroxysmal atrial fibrillation: Secondary | ICD-10-CM | POA: Diagnosis not present

## 2022-05-05 DIAGNOSIS — Z7189 Other specified counseling: Secondary | ICD-10-CM

## 2022-05-05 LAB — COMPREHENSIVE METABOLIC PANEL
ALT: 17 U/L (ref 0–44)
AST: 27 U/L (ref 15–41)
Albumin: 1.7 g/dL — ABNORMAL LOW (ref 3.5–5.0)
Alkaline Phosphatase: 122 U/L (ref 38–126)
Anion gap: 6 (ref 5–15)
BUN: 44 mg/dL — ABNORMAL HIGH (ref 8–23)
CO2: 31 mmol/L (ref 22–32)
Calcium: 8.8 mg/dL — ABNORMAL LOW (ref 8.9–10.3)
Chloride: 103 mmol/L (ref 98–111)
Creatinine, Ser: 0.54 mg/dL (ref 0.44–1.00)
GFR, Estimated: >60 mL/min (ref >60)
Glucose, Bld: 162 mg/dL — ABNORMAL HIGH (ref 70–99)
Potassium: 4.2 mmol/L (ref 3.5–5.1)
Sodium: 140 mmol/L (ref 135–145)
Total Bilirubin: 1.4 mg/dL — ABNORMAL HIGH (ref 0.3–1.2)
Total Protein: 5.1 g/dL — ABNORMAL LOW (ref 6.5–8.1)

## 2022-05-05 LAB — BRAIN NATRIURETIC PEPTIDE: B Natriuretic Peptide: 677 pg/mL — ABNORMAL HIGH (ref 0.0–100.0)

## 2022-05-05 LAB — PROTIME-INR
INR: 3.8 — ABNORMAL HIGH (ref 0.8–1.2)
Prothrombin Time: 37 seconds — ABNORMAL HIGH (ref 11.4–15.2)

## 2022-05-05 LAB — ECHOCARDIOGRAM COMPLETE
AR max vel: 1.73 cm2
AV Area VTI: 1.7 cm2
AV Area mean vel: 1.67 cm2
AV Mean grad: 4 mmHg
AV Peak grad: 8.2 mmHg
Ao pk vel: 1.43 m/s
Area-P 1/2: 3.08 cm2
Height: 62 in
MV VTI: 1.02 cm2
S' Lateral: 2.3 cm
Weight: 2144.63 oz

## 2022-05-05 LAB — CBC WITH DIFFERENTIAL/PLATELET
Abs Immature Granulocytes: 0.2 10*3/uL — ABNORMAL HIGH (ref 0.00–0.07)
Basophils Absolute: 0 10*3/uL (ref 0.0–0.1)
Basophils Relative: 0 %
Eosinophils Absolute: 0 10*3/uL (ref 0.0–0.5)
Eosinophils Relative: 0 %
HCT: 33.3 % — ABNORMAL LOW (ref 36.0–46.0)
Hemoglobin: 10.2 g/dL — ABNORMAL LOW (ref 12.0–15.0)
Immature Granulocytes: 1 %
Lymphocytes Relative: 2 %
Lymphs Abs: 0.4 10*3/uL — ABNORMAL LOW (ref 0.7–4.0)
MCH: 32 pg (ref 26.0–34.0)
MCHC: 30.6 g/dL (ref 30.0–36.0)
MCV: 104.4 fL — ABNORMAL HIGH (ref 80.0–100.0)
Monocytes Absolute: 1.6 10*3/uL — ABNORMAL HIGH (ref 0.1–1.0)
Monocytes Relative: 7 %
Neutro Abs: 19.6 10*3/uL — ABNORMAL HIGH (ref 1.7–7.7)
Neutrophils Relative %: 90 %
Platelets: 208 10*3/uL (ref 150–400)
RBC: 3.19 MIL/uL — ABNORMAL LOW (ref 3.87–5.11)
RDW: 17.8 % — ABNORMAL HIGH (ref 11.5–15.5)
WBC: 21.8 10*3/uL — ABNORMAL HIGH (ref 4.0–10.5)
nRBC: 0.1 % (ref 0.0–0.2)

## 2022-05-05 LAB — PROCALCITONIN: Procalcitonin: 0.56 ng/mL

## 2022-05-05 LAB — MAGNESIUM: Magnesium: 1.9 mg/dL (ref 1.7–2.4)

## 2022-05-05 LAB — HEPATITIS PANEL, ACUTE
HCV Ab: NONREACTIVE
Hep A IgM: NONREACTIVE
Hep B C IgM: NONREACTIVE
Hepatitis B Surface Ag: NONREACTIVE

## 2022-05-05 LAB — STREP PNEUMONIAE URINARY ANTIGEN: Strep Pneumo Urinary Antigen: NEGATIVE

## 2022-05-05 LAB — SEDIMENTATION RATE: Sed Rate: 75 mm/hr — ABNORMAL HIGH (ref 0–22)

## 2022-05-05 MED ORDER — ALPRAZOLAM 0.25 MG PO TABS: 0.2500 mg | ORAL_TABLET | Freq: Three times a day (TID) | ORAL | Status: DC | PRN

## 2022-05-05 MED ORDER — ENOXAPARIN SODIUM 60 MG/0.6ML IJ SOSY
60.0000 mg | PREFILLED_SYRINGE | Freq: Two times a day (BID) | INTRAMUSCULAR | Status: DC
  Administered 2022-05-05 – 2022-05-06 (×2): 60 mg via SUBCUTANEOUS
  Filled 2022-05-05 (×2): qty 0.6

## 2022-05-05 MED ORDER — METOPROLOL TARTRATE 5 MG/5ML IV SOLN
5.0000 mg | Freq: Four times a day (QID) | INTRAVENOUS | Status: DC
  Administered 2022-05-05 – 2022-05-06 (×2): 5 mg via INTRAVENOUS
  Filled 2022-05-05 (×2): qty 5

## 2022-05-05 MED ORDER — FUROSEMIDE 10 MG/ML IJ SOLN
40.0000 mg | Freq: Two times a day (BID) | INTRAMUSCULAR | Status: DC
  Administered 2022-05-05: 40 mg via INTRAVENOUS
  Filled 2022-05-05: qty 4

## 2022-05-05 MED ORDER — MORPHINE SULFATE (PF) 4 MG/ML IV SOLN
4.0000 mg | Freq: Once | INTRAVENOUS | Status: AC
  Administered 2022-05-05: 2 mg via INTRAVENOUS
  Filled 2022-05-05: qty 1

## 2022-05-05 MED ORDER — METHYLPREDNISOLONE SODIUM SUCC 40 MG IJ SOLR
40.0000 mg | Freq: Two times a day (BID) | INTRAMUSCULAR | Status: DC
  Administered 2022-05-05 – 2022-05-06 (×2): 40 mg via INTRAVENOUS
  Filled 2022-05-05 (×2): qty 1

## 2022-05-05 MED ORDER — DILTIAZEM HCL 60 MG PO TABS
60.0000 mg | ORAL_TABLET | Freq: Four times a day (QID) | ORAL | Status: DC
  Filled 2022-05-05: qty 1

## 2022-05-05 MED ORDER — SODIUM CHLORIDE 0.9 % IV SOLN
2.0000 g | Freq: Two times a day (BID) | INTRAVENOUS | Status: DC
  Administered 2022-05-05 – 2022-05-06 (×2): 2 g via INTRAVENOUS
  Filled 2022-05-05 (×2): qty 12.5

## 2022-05-05 MED ORDER — LORAZEPAM 2 MG/ML IJ SOLN
0.5000 mg | Freq: Three times a day (TID) | INTRAMUSCULAR | Status: DC | PRN
  Filled 2022-05-05: qty 1

## 2022-05-05 MED ORDER — ALBUMIN HUMAN 25 % IV SOLN
25.0000 g | Freq: Three times a day (TID) | INTRAVENOUS | Status: DC
  Administered 2022-05-05 – 2022-05-06 (×4): 25 g via INTRAVENOUS
  Filled 2022-05-05 (×4): qty 100

## 2022-05-05 NOTE — Consult Note (Signed)
[facility]   Patient name: Nancy Hodges Admit date: 05/21/2022  DOB: 1945-12-24 LOS: 1  MRN: [id number] Consult date: 05/23/2022  Referring provider: Dr. Roderic Palau, [facility]: Short of breath    History:  76 yo female former smoker sent from [facility] with dyspnea and hypoxia.  SpO2 80% on room air.  She was somnolent in the ER.  She was started on Bipap in the ER, and started on antibiotics for pneumonia.  She was started on lasix for CHF exacerbation.  ABG showed acute on chronic hypoxia and hypercapnia.  PCCM consulted to assist with respiratory management.  Past medical history:  A fib/flutter, Anxiety, GERD, Hiatal hernia, Diverticulosis, Lung cancer, Tachy-brady syndrome s/p PM, Cirrhosis, Nephrolithiasis  Significant events:  10/01 Admit, start ABx and Bipap 10/02 palliative care consulted >> DNR/DNI; start solumedrol  Studies:  Spirometry 02/19/16 >> FEV1 1.84 (71%), FEV1% 75 CT angio chest 05/11/2022 >> new patchy interstitial and alveolar densities with b/l effusions Lt > Rt, changes of cirrhosis, mod ascites Echo 05/05/22 >> EF 60 to 65%, mod elevation in PASP, severe LA dilation  Micro:  Blood 10/01 >>   Lines:     Antibiotics:  Cefepime 10/01 >>   Consults:  Palliative care    Interim history:  Pt received morphine this morning for dyspnea.  Hx from her daughter.  She has been feeling weaker since her last radiation therapy about 3 months ago.  She last received nivolumab about 2 months ago.  Pt was unaware that she has cirrhosis.  Daughter denies that she was using alcohol.  Swelling seems better after getting albumin and lasix.  Vital signs:  BP (!) 123/96   Pulse 100   Temp 97.6 F (36.4 C) (Axillary)   Resp (!) 24   Ht 5' 2"  (1.575 m)   Wt 60.8 kg   SpO2 92%   BMI 24.52 kg/m   Intake/output:  I/O last 3 completed shifts: In: 2686.2 [I.V.:1886.2; IV Piggyback:800] Out: 700 [Urine:700]    Physical exam:   General - somnolent Eyes - pupils reactive ENT - Bipap mask on Cardiac - irregular Chest - decreased breath sounds, scattered ronchi Abdomen - soft, non tender, + bowel sounds Extremities - no cyanosis, clubbing, or edema Skin - no rashes Neuro - opens eyes briefly with stimulation and moans, not following commands  Best practice:   DVT - xarelto SUP - N/A Nutrition - heart healthy   Assessment/plan:   Acute on chronic hypoxic/hypercapnic respiratory failure with pulmonary infiltrates. - differential includes HCAP, acute pulmonary edema, pneumonitis from nivolumab, and/or COPD with emphysema - day 2 of cefepime - lasix per per primary team - check procalcitonin, BNP, ESR - add solumedrol 40 mg q12h - goal SpO2 > 90% - continue Bipap - f/u portable CXR on 10/03 - DNR/DNI  COPD with emphysema. - continue duoneb  Acute on chronic diastolic CHF. Hx of HTN, A fib/flutter. Cirrhosis with ascites. - per primary team  Hx of Metastatic NSCLC (adenocarcinoma). - initial dx March 2014 and had Rt upper lobectomy and concurrent chemoradiation therapy - treated with nivolumab  - followed by Dr. Curt Bears with oncology  Resolved hospital problems:    Goals of care/Family discussions:  Code status: DNR/DNI; palliative care consulted  Updated pt's daughter at bedside.  Labs:      Latest Ref Rng & Units 05/05/2022    3:53 AM 05/08/2022    1:43 AM 04/03/2022    6:54 AM  CMP  Glucose 70 - 99 mg/dL 162  203  134   BUN 8 - 23 mg/dL 44  39  8   Creatinine 0.44 - 1.00 mg/dL 0.54  0.74  0.41   Sodium 135 - 145 mmol/L 140  134  136   Potassium 3.5 - 5.1 mmol/L 4.2  5.1  3.7   Chloride 98 - 111 mmol/L 103  92  105   CO2 22 - 32 mmol/L 31  33  24   Calcium 8.9 - 10.3 mg/dL 8.8  9.5  7.8   Total Protein 6.5 - 8.1 g/dL 5.1  6.0  5.4   Total Bilirubin 0.3 - 1.2 mg/dL 1.4  2.0  1.0   Alkaline Phos 38 - 126 U/L 122  158  125   AST 15 - 41 U/L 27  33  29    ALT 0 - 44 U/L 17  20  20         Latest Ref Rng & Units 05/05/2022    3:53 AM 05/16/2022    1:43 AM 04/03/2022    4:49 AM  CBC  WBC 4.0 - 10.5 K/uL 21.8  22.6  9.0   Hemoglobin 12.0 - 15.0 g/dL 10.2  11.7  9.1   Hematocrit 36.0 - 46.0 % 33.3  36.7  28.2   Platelets 150 - 400 K/uL 208  248  209     ABG    Component Value Date/Time   PHART 7.32 (L) 05/28/2022 0413   PCO2ART 70 (HH) 05/11/2022 0413   PO2ART 77 (L) 05/21/2022 0413   HCO3 35.1 (H) 05/12/2022 2018   TCO2 22 07/29/2014 0947   ACIDBASEDEF 1.2 10/28/2012 0251   O2SAT 62.4 05/07/2022 2018    CBG (last 3)  No results for input(s): "GLUCAP" in the last 72 hours.   Past surgical history:  She  has a past surgical history that includes Partial hysterectomy; Video assisted thoracoscopy (vats)/wedge resection (Right, 10/27/2012); Lobectomy (Right, 10/27/2012); colonoscopy with polypectomy (2015); Lymph node biopsy (Right, 05/01/2014); permanent pacemaker insertion (N/A, 09/14/2012); Esophagogastroduodenoscopy; Colonoscopy; Esophagogastroduodenoscopy (N/A, 01/31/2016); Cataract extraction w/PHACO (Right, 08/14/2016); Cataract extraction w/PHACO (Left, 09/25/2016); IR FLUORO GUIDE PORT INSERTION RIGHT (06/30/2017); IR US Guide Vasc Access Right (06/30/2017); PACEMAKER IMPLANT (N/A, 10/30/2017); Cardioversion (N/A, 02/19/2018); and Cardioversion (N/A, 04/30/2018).  Social history:  She  reports that she quit smoking about 26 years ago. Her smoking use included cigarettes. She has a 80.00 pack-year smoking history. She has never used smokeless tobacco. She reports that she does not drink alcohol and does not use drugs.   Review of systems:  Unable to obtain.  Family history:  Her family history includes Asthma in her father; Diabetes in her brother, mother, and sister; Heart attack in her son; Heart disease in her father and mother; Kidney cancer in her mother; Lung cancer in her sister; Pancreatic cancer in her brother.    Medications:    No current facility-administered medications on file prior to encounter.   Current Outpatient Medications on File Prior to Encounter  Medication Sig   acetaminophen (TYLENOL) 325 MG tablet Take 2 tablets (650 mg total) by mouth every 4 (four) hours as needed for moderate pain, fever or headache.   albuterol (VENTOLIN HFA) 108 (90 Base) MCG/ACT inhaler Inhale 2 puffs into the lungs every 4 (four) hours as needed for wheezing or shortness of breath.   ALPRAZolam (XANAX) 0.25 MG tablet Take 1 tablet (0.25 mg total) by mouth 3 (three) times daily as needed  for anxiety.   azelastine (ASTELIN) 0.1 % nasal spray Place 1 spray into both nostrils 2 (two) times daily as needed for rhinitis or allergies. Use in each nostril as directed   calcium carbonate (TUMS - DOSED IN MG ELEMENTAL CALCIUM) 500 MG chewable tablet Chew 1 tablet by mouth 2 (two) times daily as needed for indigestion or heartburn.    CARTIA XT 240 MG 24 hr capsule TAKE 1 CAPSULE BY MOUTH DAILY   dimenhyDRINATE (DRAMAMINE) 50 MG tablet Take 12.5 mg by mouth every 8 (eight) hours as needed for dizziness.   ipratropium-albuterol (DUONEB) 0.5-2.5 (3) MG/3ML SOLN Take 3 mLs by nebulization 2 (two) times daily.   lidocaine-prilocaine (EMLA) cream APPLY TOPICALLY TO TO THE AFFECTED AREA AS NEEDED (Patient taking differently: Apply 1 Application topically as needed (for pain).)   ondansetron (ZOFRAN-ODT) 4 MG disintegrating tablet Take 1 tablet (4 mg total) by mouth every 8 (eight) hours as needed for nausea or vomiting.   Polyvinyl Alcohol-Povidone (REFRESH OP) Place 1 drop into both eyes daily as needed (dry eyes).   rivaroxaban (XARELTO) 20 MG TABS tablet Take 1 tablet (20 mg total) by mouth daily with supper.     Critical care time: 39 minutes  Chesley Mires, MD [place] Pager - [phone] 05/05/2022, 1:48 PM

## 2022-05-05 NOTE — Progress Notes (Signed)
*  PRELIMINARY RESULTS* Echocardiogram 2D Echocardiogram has been performed.  Nancy Hodges 05/05/2022, 11:33 AM

## 2022-05-05 NOTE — Consult Note (Signed)
Consultation Note Date: 05/05/2022   Patient Name: Nancy Hodges  DOB: [birth year]  MRN: [id number]  Age / Sex: 76 y.o., female  PCP: [facility] Referring Physician: Kathie Dike, MD  Reason for Consultation: Establishing goals of care  HPI/Patient Profile: 76 y.o. female  with past medical history of stroke with right-sided weakness, lung cancer (monthly Nivolumab infusions since 2018), COPD, atrial flutter/fibrillation, s/p pacemaker, anxiety, diverticulosis, GERD admitted on 05/24/2022 with shortness of breath requiring BiPAP due to multifocal pneumonia and COPD exacerbation. Concern for new CHF and liver cirrhosis. Recently admitted 04/03/22-04/05/22 with pneumonia and noted significant weigh loss and weakness over past month prior to admission. Reviewed Advance Directive that is uploaded into electronic chart.   Clinical Assessment and Goals of Care: I have reviewed records and recent hospitalization. I met with Ms. Rohr and she is lethargic and weak but resting comfortably. She has limited reserve and unable to speak with me but nods her head yes/no and she denies pain and denies trouble breathing.   I called and spoke with daughter, Helene Kelp. Helene Kelp and I had a long discussion about her mother's progression of illness with noted decline since her last radiation treatment ~3 months ago. We discussed complicated illness with multiple organ dysfunction but primarily declined lung function. Helene Kelp and I discussed concern for worsening status and concern that this may not be reversible. We reviewed Living Will and Ms. Derasmo's wishes and ultimately makes decision for DNR which I support. She also notes that Ms. Peaster would not want feeding tube. I further discussed with Helene Kelp that Ms. Smeal is having decline from her medical issues and that poor intake is a side effect of the severity  of her health condition and I do not feel that a feeding tube is the answer. I further explained that there are side effects and suffering associated with feeding tube - we will hold off on this for now.   We had an honest conversation regarding poor prognosis. Helene Kelp asks about FMLA and if she should request more time off work. I shared with Helene Kelp that I am concerned that her mother may be approaching end of life and she is so ill that anything could happen at any time. I worry that she could decline towards end of life in the coming days to weeks. With this Helene Kelp will request time off work - I provided note for Helene Kelp to provide for her work. We agreed to continue current interventions and that Ms. Luce will show [place] the signs of some level of improvement or worsening. Helene Kelp does not want her mother to suffer.   All questions/concerns addressed. Emotional support provided.   Primary Decision Maker HCPOA daughter Helene Kelp    SUMMARY OF RECOMMENDATIONS   - DNR decided - Watchful waiting  - Will need further discussion for comfort measures with further decline  Code Status/Advance Care Planning: DNR   Symptom Management:  Low dose anxiety medication ordered PRN.  If worsening shortness of breath may consider low  dose morphine 1-2 mg IV if needed.   Prognosis:  Overall prognosis poor.   Discharge Planning: To Be Determined      Primary Diagnoses: Present on Admission:  Acute respiratory failure with hypoxia (HCC)  Paroxysmal atrial fibrillation ([place])   I have reviewed the medical record, interviewed the patient and family, and examined the patient. The following aspects are pertinent.  Past Medical History:  Diagnosis Date   Allergic rhinitis    Anxiety    Arthritis    Asthma    Atrial flutter ([place])    Cholelithiases 09/26/2015   Diverticulosis    Encounter for antineoplastic immunotherapy 12/09/2016   GERD (gastroesophageal reflux disease)    H/O hiatal hernia     History of blood transfusion    History of chemotherapy    History of kidney stones    History of radiation therapy    Right and left lung- 11/18/21-11/28/21- Dr. Gery Pray   Internal hemorrhoid    Lung cancer [facility])    a. Stage IB non-small cell carcinoma, s/p R VATS, wedge resection, RU lobectomy 10/2012.   Persistent atrial fibrillation (HCC)    a. anticoagulated with Xarelto   Pneumonia [facility]/2016   [facility]   Presence of permanent cardiac pacemaker    Pulmonary nodule    Tachycardia-bradycardia syndrome ([place])    a. 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   Social History   Socioeconomic History   Marital status: Single    Spouse name: Not on file   Number of children: Not on file   Years of education: Not on file   Highest education level: Not on file  Occupational History   Not on file  Tobacco Use   Smoking status: Former    Packs/day: 2.00    Years: 40.00    Total pack years: 80.00    Types: Cigarettes    Quit date: 08/05/1995    Years since quitting: 26.7   Smokeless tobacco: Never  Vaping Use   Vaping Use: Never used  Substance and Sexual Activity   Alcohol use: No   Drug use: No   Sexual activity: Not Currently  Other Topics Concern   Not on file  Social History Narrative   Patient lives alone in [place].   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   Patient has 1 living daughter    Social Determinants of Health   Financial Resource Strain: Not on file  Food Insecurity: No Food Insecurity (06/03/2022)   Hunger Vital Sign    Worried About Running Out of Food in the Last Year: Never true    Ran Out of Food in the Last Year: Never true  Transportation Needs: No Transportation Needs (05/26/2022)   PRAPARE - Hydrologist (Medical): No    Lack of Transportation (Non-Medical): No  Physical Activity: Unknown (10/30/2017)   Exercise Vital Sign    Days of Exercise per Week: Patient refused    Minutes of Exercise per  Session: Patient refused  Stress: Unknown (10/30/2017)   [place]    Feeling of Stress : Patient refused  Social Connections: Unknown (10/30/2017)   Social Connection and Isolation Panel [NHANES]    Frequency of Communication with Friends and Family: Patient refused    Frequency of Social Gatherings with Friends and Family: Patient refused    Attends Religious Services: Patient refused    Active Member of Clubs or Organizations: Patient refused  Attends Archivist Meetings: Patient refused    Marital Status: Patient refused   Family History  Problem Relation Age of Onset   Heart disease Mother    Diabetes Mother    Kidney cancer Mother    Asthma Father    Heart disease Father    Pancreatic cancer Brother    Diabetes Sister    Lung cancer Sister    Diabetes Brother    Heart attack Son    Colon cancer Neg Hx    Breast cancer Neg Hx    Scheduled Meds:  carvedilol  3.125 mg Oral BID WC   Chlorhexidine Gluconate Cloth  6 each Topical Daily   diltiazem  240 mg Oral Daily   furosemide  20 mg Intravenous Q12H   guaiFENesin  600 mg Oral BID   ipratropium-albuterol  3 mL Nebulization Q6H   lactulose  10 g Oral Daily   rivaroxaban  20 mg Oral Q supper   spironolactone  25 mg Oral Daily   Continuous Infusions:  sodium chloride 10 mL/hr at 05/05/22 0855   albumin human 25 g (05/05/22 0901)   aztreonam Stopped (05/05/22 0659)   vancomycin Stopped (05/05/22 0529)   PRN Meds:.albuterol, ALPRAZolam Allergies  Allergen Reactions   Chlorpheniramine-Dm Anaphylaxis and Other (See Comments)    Pt states BP dropped; developed irregular heartbeat    Prednisone Shortness Of Breath, Palpitations and Other (See Comments)    Double vision. History is not consistent with any allergy. "drove me crazy"   Rofecoxib Shortness Of Breath and Other (See Comments)    Stomach cramps Other reaction(s): shortness of breath    Septra [Sulfamethoxazole-Trimethoprim] Shortness Of Breath   Doxycycline Monohydrate Diarrhea, Nausea And Vomiting and Dermatitis   Fluticasone     Other reaction(s): dizziness   Molds & Smuts    Meclizine Rash   Penicillins Swelling, Rash and Other (See Comments)    DID THE REACTION INVOLVE: Swelling of the face/tongue/throat, SOB, or low BP? No Sudden or severe rash/hives, skin peeling, or the inside of the mouth or nose? Yes Did it require medical treatment? No  When did it last happen?      unknown If all above answers are "NO", may proceed with cephalosporin use.    Review of Systems  Unable to perform ROS: Acuity of condition    Physical Exam Vitals and nursing note reviewed.  Constitutional:      Appearance: She is ill-appearing.  Cardiovascular:     Rate and Rhythm: Tachycardia present.  Pulmonary:     Effort: No tachypnea, accessory muscle usage or respiratory distress.     Comments: HFNC 30L when I first saw her. Now back on BiPAP.  Abdominal:     General: Abdomen is flat.     Palpations: Abdomen is soft.  Neurological:     Mental Status: She is lethargic.     Vital Signs: BP (!) 114/90   Pulse 100   Temp (!) 96.8 F (36 C) (Axillary)   Resp (!) 24   Ht _0  (1.575 m)   Wt 60.8 kg   SpO2 91%   BMI 24.52 kg/m  Pain Scale: 0-10 POSS *See Group Information*: 1-Acceptable,Awake and alert Pain Score: 0-No pain   SpO2: SpO2: 91 % O2 Device:SpO2: 91 % O2 Flow Rate: .O2 Flow Rate (L/min): 30 L/min  IO: Intake/output summary:  Intake/Output Summary (Last 24 hours) at 05/05/2022 0906 Last data filed at 05/05/2022 0855 Gross per 24 hour  Intake 2696.07  ml  Output 700 ml  Net 1996.07 ml    LBM: Last BM Date :  (PTA) Baseline Weight: Weight: 62 kg Most recent weight: Weight: 60.8 kg     Palliative Assessment/Data:     Time In: 0945  Time Total: 95 min  Greater than 50%  of this time was spent counseling and coordinating care related to the above  assessment and plan.  Signed by: Vinie Sill, NP Palliative Medicine Team Pager # [phone] (M-F 8a-5p) Team Phone # [phone] (Nights/Weekends)

## 2022-05-05 NOTE — Progress Notes (Addendum)
ANTICOAGULATION CONSULT NOTE - Initial Consult  Pharmacy Consult for enoxaparin Indication: atrial fibrillation  Allergies  Allergen Reactions   Chlorpheniramine-Dm Anaphylaxis and Other (See Comments)    Pt states BP dropped; developed irregular heartbeat    Rofecoxib Shortness Of Breath and Other (See Comments)    Stomach cramps Other reaction(s): shortness of breath   Septra [Sulfamethoxazole-Trimethoprim] Shortness Of Breath   Doxycycline Monohydrate Diarrhea, Nausea And Vomiting and Dermatitis   Fluticasone     Other reaction(s): dizziness   Molds & Smuts    Meclizine Rash   Penicillins Swelling, Rash and Other (See Comments)    DID THE REACTION INVOLVE: Swelling of the face/tongue/throat, SOB, or low BP? No Sudden or severe rash/hives, skin peeling, or the inside of the mouth or nose? Yes Did it require medical treatment? No  When did it last happen?      unknown If all above answers are "NO", may proceed with cephalosporin use.    Prednisone Other (See Comments)    Anxiety    Patient Measurements: Height: 5\' 2"  (157.5 cm) Weight: 60.8 kg (134 lb 0.6 oz) IBW/kg (Calculated) : 50.1   Vital Signs: Temp: 97.3 F (36.3 C) (10/02 1551) Temp Source: Axillary (10/02 1551) BP: 123/96 (10/02 1100) Pulse Rate: 100 (10/02 1142)  Labs: Recent Labs    05/12/2022 0143 05/21/2022 0251 05/05/22 0353  HGB 11.7*  --  10.2*  HCT 36.7  --  33.3*  PLT 248  --  208  LABPROT  --   --  37.0*  INR  --   --  3.8*  CREATININE 0.74  --  0.54  TROPONINIHS 16 14  --     Estimated Creatinine Clearance: 49.8 mL/min (by C-G formula based on SCr of 0.54 mg/dL).   Medical History: Past Medical History:  Diagnosis Date   Allergic rhinitis    Anxiety    Arthritis    Asthma    Atrial flutter ([place])    Cholelithiases 09/26/2015   Diverticulosis    Encounter for antineoplastic immunotherapy 12/09/2016   GERD (gastroesophageal reflux disease)    H/O hiatal hernia    History of blood  transfusion    History of chemotherapy    History of kidney stones    History of radiation therapy    Right and left lung- 11/18/21-11/28/21- Dr. Gery Pray   Internal hemorrhoid    Lung cancer [facility])    a. Stage IB non-small cell carcinoma, s/p R VATS, wedge resection, RU lobectomy 10/2012.   Persistent atrial fibrillation (HCC)    a. anticoagulated with Xarelto   Pneumonia 06/2016   [facility]   Presence of permanent cardiac pacemaker    Pulmonary nodule    Tachycardia-bradycardia syndrome ([place])    a. 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    Assessment: 76 yo W with hx Afib/flutter on rivaroxaban at home now held for possible procedure. Last dose 10/1 at 18:15. Pharmacy consulted for enoxaparin.   ClCr ~50 ml/min. INR elevated due to rivaroxaban.   Goal of Therapy:  Monitor platelets by anticoagulation protocol: Yes   Plan:  Enoxaparin 60mg  Q12 hr Monitor for signs/symptoms of bleeding    Benetta Spar, PharmD, BCPS, BCCP Clinical Pharmacist  Please check AMION for all [facility] phone numbers After 10:00 PM, call [place] [phone]

## 2022-05-05 NOTE — Progress Notes (Addendum)
PROGRESS NOTE     Nancy Hodges, is a 76 y.o. female, DOB - May 18, 1946, DHR:[id number]  Admit date - 05/05/2022   Admitting Physician Rolla Plate, DO  Outpatient Primary MD for the patient is [facility]  LOS - 1  Chief Complaint  Patient presents with   Shortness of Breath        Brief Narrative:  76 y.o. female with medical history significant of atrial flutter, anxiety, diverticulosis, GERD, lung cancer, persistent atrial fibrillation, presence of pacemaker admitted on 06/02/2022 with recurrent Multi-Focal Pneumonia    -Assessment and Plan: 1) Acute respiratory failure with hypercapnia and hypoxia due to Recurrent Bil  Multi-Focal PNA - Patient satting in mid 80s on nonrebreather at arrival -PCO2 was 70 with a pH of 7.32 Patient required Bipap, weaning to nasal cannula CTA chest on 05/24/2022----shows Extensive new interstitial and alveolar densities are seen in both lungs suggesting multifocal pneumonia -Recently treated with IV cefepime and Levaquin -Currently, restarted on cefepime.  Vancomycin discontinued since MRSA PCR is negative -Continue bronchodilators and mucolytics -Lactic acid improved to 1.3 from 2.6 after IV fluids -Patient is immunocompromised due to underlying lung cancer with prior lung surgery chemoradiation therapy -Speech therapy evaluation requested due to concerns for recurrent aspiration -Since she has been unable to wean from BiPAP, pulmonology input requested  2)Sepsis due to Recurrent Bil  Multi-Focal PNA---POA -Patient met sepsis criteria on admission -Management as above #1 Patient is immunocompromised due to underlying lung cancer with prior lung surgery, chemotherapy and radiation -Strep pneumo antigen negative -Legionella antigen in process -Currently on cefepime  3)Liver Cirrhosis with Ascites------?? Etiology, not much of a drinker -No prior history of fatty liver -Viral hepatitis panel is  negative --serum ammonia is 46,, Pt is somewhat sleepy -Give Lactulose -Given Lasix, Aldactone and Coreg -INR is elevated, but she is on Xarelto  4) acute diastolic congestive heart failure Pulmonary hypertension, RV pressure on echo 53.4 mmHg -Patient with bilateral pleural effusions and ascites -Lasix and Aldactone as above -We will also give albumin infusion prior to Lasix to help assist with diuresis -Echo shows preserved ejection fraction, elevated pulmonary pressures  5)COPD with acute exacerbation (HCC) -Secondary to # 1 above  -management as above #1 -Started on Solu-Medrol, bronchodilators  6)Acute metabolic encephalopathy -Multifactorial in the setting of hypercapnia and liver cirrhosis with mildly elevated ammonia -Management as above #1  7)Paroxysmal atrial fibrillation (HCC) Change diltiazem to short acting, which can be crushed, since she is having trouble swallowing long-acting medication -She is on anticoagulation, will change Xarelto to Lovenox she is having trouble swallowing on BiPAP -Coreg added due to liver cirrhosis with with ascites raising concerns for portal hypertension  8)Social/Ethics--- palliative care following -Patient is now DNR -Further decisions on goals of care based on patient progression  9)H/o Metastatic non-small cell lung cancer, adenocarcinoma. This was initially diagnosed as stage IB (T2a, N0, M0) in March of 2014, status post right upper lobectomy with lymph node dissection but the patient has evidence for disease recurrence in the right axilla status post resection.  --Patient previously treated with chemotherapy and radiation,  -currently on immunotherapy -Patient sees Dr. Lorna Few  -Total care time is over 35 minutes  Disposition/Need for in-Hospital Stay- patient unable to be discharged at this time due to --acute hypoxic and hypercapnic respiratory failure with metabolic encephalopathy requiring IV antibiotics... And close  monitoring including BiPAP  Status is: Inpatient   Disposition: The patient is from: SNF  Anticipated d/c is to: SNF              Anticipated d/c date is: > 3 days              Patient currently is not medically stable to d/c. Barriers: Not Clinically Stable-   Code Status : -  Code Status: DNR   Family Communication:  d/w Nancy Hodges (daughter) 10/2  DVT Prophylaxis  :   - SCDs   SCDs Start: 05/15/2022 0758 rivaroxaban (XARELTO) tablet 20 mg   Lab Results  Component Value Date   PLT 208 05/05/2022    Inpatient Medications  Scheduled Meds:  carvedilol  3.125 mg Oral BID WC   Chlorhexidine Gluconate Cloth  6 each Topical Daily   diltiazem  240 mg Oral Daily   furosemide  20 mg Intravenous Q12H   guaiFENesin  600 mg Oral BID   ipratropium-albuterol  3 mL Nebulization Q6H   lactulose  10 g Oral Daily   methylPREDNISolone (SOLU-MEDROL) injection  40 mg Intravenous Q12H   rivaroxaban  20 mg Oral Q supper   spironolactone  25 mg Oral Daily   Continuous Infusions:  sodium chloride 10 mL/hr at 05/05/22 0855   albumin human 25 g (05/05/22 1456)   ceFEPime (MAXIPIME) IV 2 g (05/05/22 1135)   PRN Meds:.albuterol, LORazepam **OR** ALPRAZolam   Anti-infectives (From admission, onward)    Start     Dose/Rate Route Frequency Ordered Stop   05/05/22 1200  ceFEPIme (MAXIPIME) 2 g in sodium chloride 0.9 % 100 mL IVPB        2 g 200 mL/hr over 30 Minutes Intravenous Every 12 hours 05/05/22 0947     05/05/22 0400  vancomycin (VANCOREADY) IVPB 1250 mg/250 mL  Status:  Discontinued        1,250 mg 166.7 mL/hr over 90 Minutes Intravenous Every 24 hours 05/23/2022 0330 05/05/22 0944   05/27/2022 1300  aztreonam (AZACTAM) 1 g in sodium chloride 0.9 % 100 mL IVPB  Status:  Discontinued        1 g 200 mL/hr over 30 Minutes Intravenous Every 8 hours 06/01/2022 0513 05/05/22 0947   05/13/2022 0330  aztreonam (AZACTAM) 2 g in sodium chloride 0.9 % 100 mL IVPB        2 g 200 mL/hr over  30 Minutes Intravenous  Once 05/14/2022 0323 05/22/2022 0554   05/20/2022 0330  vancomycin (VANCOREADY) IVPB 1250 mg/250 mL        1,250 mg 166.7 mL/hr over 90 Minutes Intravenous  Once 05/07/2022 0328 05/26/2022 6045        Subjective: Patient is awake, currently on BiPAP She has been unable to wean from BiPAP No complaints of chest pain  Objective: Vitals:   05/05/22 1200 05/05/22 1342 05/05/22 1400 05/05/22 1551  BP:      Pulse:      Resp:      Temp:    (!) 97.3 F (36.3 C)  TempSrc:    Axillary  SpO2: 92% 92% 92%   Weight:      Height:        Intake/Output Summary (Last 24 hours) at 05/05/2022 1720 Last data filed at 05/05/2022 0855 Gross per 24 hour  Intake 2195.04 ml  Output 700 ml  Net 1495.04 ml   Filed Weights   05/21/2022 0123 05/19/2022 1432 05/05/22 0331  Weight: 62 kg 61 kg 60.8 kg    Physical Exam  Gen:-Sleepy,,  in no apparent distress  HEENT:-  [place].AT, No sclera icterus Nose-BiPAP in place Neck-Supple Neck Lungs-coarse crackles bilaterally CV- S1, S2 normal, irregular Abd-  +ve B.Sounds, Abd Soft, No tenderness,    Extremity/Skin:- +ve  edema, pedal pulses present  neuro-generalized weakness no new focal deficits, no tremors  Data Reviewed: I have personally reviewed following labs and imaging studies  CBC: Recent Labs  Lab 05/24/2022 0143 05/05/22 0353  WBC 22.6* 21.8*  NEUTROABS 20.3* 19.6*  HGB 11.7* 10.2*  HCT 36.7 33.3*  MCV 100.3* 104.4*  PLT 248 482   Basic Metabolic Panel: Recent Labs  Lab 05/17/2022 0143 05/05/22 0353  NA 134* 140  K 5.1 4.2  CL 92* 103  CO2 33* 31  GLUCOSE 203* 162*  BUN 39* 44*  CREATININE 0.74 0.54  CALCIUM 9.5 8.8*  MG  --  1.9   GFR: Estimated Creatinine Clearance: 49.8 mL/min (by C-G formula based on SCr of 0.54 mg/dL). Liver Function Tests: Recent Labs  Lab 05/16/2022 0143 05/05/22 0353  AST 33 27  ALT 20 17  ALKPHOS 158* 122  BILITOT 2.0* 1.4*  PROT 6.0* 5.1*  ALBUMIN 2.0* 1.7*    Recent Results  (from the past 240 hour(s))  Blood culture (routine x 2)     Status: None (Preliminary result)   Collection Time: 05/19/2022  2:38 AM   Specimen: BLOOD RIGHT FOREARM  Result Value Ref Range Status   Specimen Description BLOOD RIGHT FOREARM BOTTLES DRAWN AEROBIC ONLY  Final   Special Requests   Final    Blood Culture results may not be optimal due to an inadequate volume of blood received in culture bottles   Culture   Final    NO GROWTH < 24 HOURS Performed at [facility], [address] [place], [place] [address]    Report Status PENDING  Incomplete  Blood culture (routine x 2)     Status: None (Preliminary result)   Collection Time: 05/09/2022  2:51 AM   Specimen: Site Not Specified; Blood  Result Value Ref Range Status   Specimen Description   Final    SITE NOT [place] AND ANAEROBIC   Special Requests Blood Culture adequate volume  Final   Culture   Final    NO GROWTH < 24 HOURS Performed at [facility], [address] [place], [place] [address]    Report Status PENDING  Incomplete  MRSA Next Gen by PCR, Nasal     Status: None   Collection Time: 05/19/2022  2:27 PM   Specimen: Anterior Nasal Swab  Result Value Ref Range Status   MRSA by PCR Next Gen NOT DETECTED NOT DETECTED Final    Comment: (NOTE) The GeneXpert MRSA Assay (FDA approved for NASAL specimens only), is one component of a comprehensive MRSA colonization surveillance program. It is not intended to diagnose MRSA infection nor to guide or monitor treatment for MRSA infections. Test performance is not FDA approved in patients less than 34 years old. Performed at [facility], [address] [place], [place] [address]   SARS Coronavirus 2 by RT PCR (hospital order, performed in [facility] hospital lab) *cepheid single result test* Anterior Nasal Swab     Status: None   Collection Time: 05/27/2022  2:30 PM   Specimen: Anterior Nasal Swab  Result Value Ref Range Status   SARS Coronavirus 2 by RT  PCR NEGATIVE NEGATIVE Final    Comment: (NOTE) SARS-CoV-2 target nucleic acids are NOT DETECTED.  The SARS-CoV-2 RNA is generally detectable in  upper and lower respiratory specimens during the acute phase of infection. The lowest concentration of SARS-CoV-2 viral copies this assay can detect is 250 copies / mL. A negative result does not preclude SARS-CoV-2 infection and should not be used as the sole basis for treatment or other patient management decisions.  A negative result may occur with improper specimen collection / handling, submission of specimen other than nasopharyngeal swab, presence of viral mutation(s) within the areas targeted by this assay, and inadequate number of viral copies (<250 copies / mL). A negative result must be combined with clinical observations, patient history, and epidemiological information.  Fact Sheet for Patients:   https://www.patel.info/  Fact Sheet for Healthcare Providers: https://hall.com/  This test is not yet approved or  cleared by the [place] FDA and has been authorized for detection and/or diagnosis of SARS-CoV-2 by FDA under an Emergency Use Authorization (EUA).  This EUA will remain in effect (meaning this test can be used) for the duration of the COVID-19 declaration under Section 564(b)(1) of the Act, 21 U.S.C. section 360bbb-3(b)(1), unless the authorization is terminated or revoked sooner.  Performed at [facility], [address], [place], [place] [address]     Radiology Studies: ECHOCARDIOGRAM COMPLETE  Result Date: 05/05/2022    ECHOCARDIOGRAM REPORT   Patient Name:   Nancy Hodges Date of Exam: 05/05/2022 Medical Rec #:  [id number]          Height:       62.0 in Accession #:    [id number]         Weight:       134.0 lb Date of Birth:  1945/09/17          BSA:          1.613 m Patient Age:    67 years           BP:           112/45 mmHg Patient Gender: F                  HR:            101 bpm. Exam Location:  [facility] Procedure: 2D Echo, Cardiac Doppler and Color Doppler Indications:    Abnormal ECG  History:        Patient has prior history of Echocardiogram examinations, most                 recent 10/28/2017. COPD; Arrythmias:Atrial Fibrillation,                 Tachycardia and Bradycardia. Lung CA, Lobectomy, Port wire                 visualized in RA.  Sonographer:    Wenda Low Referring Phys: [id number] COURAGE EMOKPAE  Sonographer Comments: Image acquisition challenging due to patient behavioral factors. and Image acquisition challenging due to uncooperative patient. IMPRESSIONS  1. Left ventricular ejection fraction, by estimation, is 60 to 65%. The left ventricle has normal function. The left ventricle has no regional wall motion abnormalities. Left ventricular diastolic parameters are indeterminate.  2. Right ventricular systolic function is normal. The right ventricular size is normal. There is moderately elevated pulmonary artery systolic pressure.  3. Left atrial size was severely dilated.  4. Right atrial size was moderately dilated.  5. Trivial mitral valve regurgitation.  6. The aortic valve was not well visualized. Aortic valve regurgitation is mild.  7. The inferior vena cava is normal  in size with greater than 50% respiratory variability, suggesting right atrial pressure of 3 mmHg. FINDINGS  Left Ventricle: Left ventricular ejection fraction, by estimation, is 60 to 65%. The left ventricle has normal function. The left ventricle has no regional wall motion abnormalities. The left ventricular internal cavity size was normal in size. There is  no left ventricular hypertrophy. Left ventricular diastolic parameters are indeterminate. Right Ventricle: The right ventricular size is normal. No increase in right ventricular wall thickness. Right ventricular systolic function is normal. There is moderately elevated pulmonary artery systolic pressure. The tricuspid  regurgitant velocity is 3.37 m/s, and with an assumed right atrial pressure of 8 mmHg, the estimated right ventricular systolic pressure is 38.8 mmHg. Left Atrium: Left atrial size was severely dilated. Right Atrium: Right atrial size was moderately dilated. Pericardium: There is no evidence of pericardial effusion. Mitral Valve: There is mild thickening of the anterior mitral valve leaflet(s). There is mild calcification of the anterior mitral valve leaflet(s). Mildly decreased mobility of the mitral valve leaflets. Trivial mitral valve regurgitation. MV peak gradient, 26.8 mmHg. The mean mitral valve gradient is 7.0 mmHg. Tricuspid Valve: Tricuspid valve regurgitation is mild. Aortic Valve: The aortic valve was not well visualized. Aortic valve regurgitation is mild. Aortic valve mean gradient measures 4.0 mmHg. Aortic valve peak gradient measures 8.2 mmHg. Aortic valve area, by VTI measures 1.70 cm. Pulmonic Valve: The pulmonic valve was not well visualized. Pulmonic valve regurgitation is not visualized. Aorta: The aortic root and ascending aorta are structurally normal, with no evidence of dilitation. Venous: The inferior vena cava is normal in size with greater than 50% respiratory variability, suggesting right atrial pressure of 3 mmHg. IAS/Shunts: The interatrial septum was not well visualized.  LEFT VENTRICLE PLAX 2D LVIDd:         4.20 cm   Diastology LVIDs:         2.30 cm   LV e' medial:    9.90 cm/s LV PW:         0.70 cm   LV E/e' medial:  16.4 LV IVS:        0.70 cm   LV e' lateral:   14.10 cm/s LVOT diam:     1.65 cm   LV E/e' lateral: 11.5 LV SV:         43 LV SV Index:   27 LVOT Area:     2.14 cm  RIGHT VENTRICLE RV Basal diam:  3.30 cm RV Mid diam:    2.80 cm RV S prime:     13.30 cm/s TAPSE (M-mode): 2.1 cm LEFT ATRIUM             Index        RIGHT ATRIUM           Index LA diam:        3.70 cm 2.29 cm/m   RA Area:     20.70 cm LA Vol (A2C):   77.3 ml 47.93 ml/m  RA Volume:   61.10 ml   37.89 ml/m LA Vol (A4C):   73.2 ml 45.39 ml/m LA Biplane Vol: 78.1 ml 48.43 ml/m  AORTIC VALVE AV Area (Vmax):    1.73 cm AV Area (Vmean):   1.67 cm AV Area (VTI):     1.70 cm AV Vmax:           143.00 cm/s AV Vmean:          88.700 cm/s AV VTI:  0.255 m AV Peak Grad:      8.2 mmHg AV Mean Grad:      4.0 mmHg LVOT Vmax:         116.00 cm/s LVOT Vmean:        69.100 cm/s LVOT VTI:          0.203 m LVOT/AV VTI ratio: 0.80  AORTA Ao Root diam: 2.80 cm MITRAL VALVE                TRICUSPID VALVE MV Area (PHT): 3.08 cm     TR Peak grad:   45.4 mmHg MV Area VTI:   1.02 cm     TR Vmax:        337.00 cm/s MV Peak grad:  26.8 mmHg MV Mean grad:  7.0 mmHg     SHUNTS MV Vmax:       2.59 m/s     Systemic VTI:  0.20 m MV Vmean:      108.0 cm/s   Systemic Diam: 1.65 cm MV Decel Time: 246 msec MV E velocity: 162.00 cm/s Phineas Inches Electronically signed by Phineas Inches Signature Date/Time: 05/05/2022/12:06:32 PM    Final    CT Angio Chest Pulmonary Embolism (PE) W or WO Contrast  Result Date: 05/15/2022 CLINICAL DATA:  Chronic dyspnea EXAM: CT ANGIOGRAPHY CHEST WITH CONTRAST TECHNIQUE: Multidetector CT imaging of the chest was performed using the standard protocol during bolus administration of intravenous contrast. Multiplanar CT image reconstructions and MIPs were obtained to evaluate the vascular anatomy. RADIATION DOSE REDUCTION: This exam was performed according to the departmental dose-optimization program which includes automated exposure control, adjustment of the mA and/or kV according to patient size and/or use of iterative reconstruction technique. CONTRAST:  49m OMNIPAQUE IOHEXOL 350 MG/ML SOLN COMPARISON:  Previous CT done on 01/09/2022, chest radiograph done earlier today FINDINGS: Cardiovascular: There are no intraluminal filling defects in central pulmonary artery branches. Evaluation of small peripheral branches is limited by extensive infiltrates and breathing motion artifacts. There is  homogeneous enhancement in thoracic aorta. Coronary artery calcifications are seen. Heart is enlarged in size. Pacer leads are noted in place. Mediastinum/Nodes: Enlarged lymph nodes are seen in mediastinum and hilar regions. There is possible edema in mediastinal fat planes. Lungs/Pleura: Extensive new patchy interstitial and alveolar densities are seen in both lungs. Large bilateral pleural effusions are seen, more so on the left side. There is no pneumothorax. Upper Abdomen: There is nodularity in liver surface suggesting cirrhosis. Gallbladder stones are seen. Moderate ascites is present in the upper abdomen. Musculoskeletal: There is edema in subcutaneous plane suggesting anasarca. Review of the MIP images confirms the above findings. IMPRESSION: There is no evidence of central pulmonary artery embolism. There is no evidence of thoracic aortic dissection. Atherosclerotic changes are noted in thoracic aorta. Coronary artery calcifications are seen. Extensive new interstitial and alveolar densities are seen in both lungs suggesting multifocal pneumonia and possibly pulmonary edema. There is interval appearance of large bilateral pleural effusions, more so on the left side. There is increased density in mediastinum which may suggest lymphadenopathy and possibly edema. Cirrhosis of liver.  Gallbladder stones.  Moderate ascites. Other findings as described in the body of the report. Electronically Signed   By: PElmer PickerM.D.   On: 05/23/2022 10:23   DG Chest Portable 1 View  Result Date: 05/22/2022 CLINICAL DATA:  Shortness of breath EXAM: PORTABLE CHEST 1 VIEW COMPARISON:  04/03/2019 FINDINGS: Cardiac shadow is stable. Pacing device is again seen. Right  chest wall port and loop recorder are again noted. Lungs are well aerated bilaterally. Persistent opacities are noted in the bases bilaterally similar to that seen on prior exam although new patchy opacities are noted throughout both lungs particularly  in the left upper lobe likely representing more acute infiltrate. IMPRESSION: Significant increase in airspace opacities particularly in the left upper lobe as described. Electronically Signed   By: Inez Catalina M.D.   On: 05/24/2022 01:38    Scheduled Meds:  carvedilol  3.125 mg Oral BID WC   Chlorhexidine Gluconate Cloth  6 each Topical Daily   diltiazem  240 mg Oral Daily   furosemide  20 mg Intravenous Q12H   guaiFENesin  600 mg Oral BID   ipratropium-albuterol  3 mL Nebulization Q6H   lactulose  10 g Oral Daily   methylPREDNISolone (SOLU-MEDROL) injection  40 mg Intravenous Q12H   rivaroxaban  20 mg Oral Q supper   spironolactone  25 mg Oral Daily   Continuous Infusions:  sodium chloride 10 mL/hr at 05/05/22 0855   albumin human 25 g (05/05/22 1456)   ceFEPime (MAXIPIME) IV 2 g (05/05/22 1135)     LOS: 1 day    Kathie Dike M.D on 05/05/2022 at 5:20 PM  Go to [email or web address] - for contact info  [facility] - Office  [phone]  If 7PM-7AM, please contact night-coverage [email or web address] 05/05/2022, 5:20 PM

## 2022-05-06 ENCOUNTER — Other Ambulatory Visit: Payer: Medicare Other

## 2022-05-06 ENCOUNTER — Inpatient Hospital Stay (HOSPITAL_COMMUNITY): Payer: Medicare Other

## 2022-05-06 ENCOUNTER — Ambulatory Visit: Payer: Medicare Other

## 2022-05-06 ENCOUNTER — Ambulatory Visit: Payer: Medicare Other | Admitting: Internal Medicine

## 2022-05-06 DIAGNOSIS — Z7189 Other specified counseling: Secondary | ICD-10-CM | POA: Diagnosis not present

## 2022-05-06 DIAGNOSIS — Z515 Encounter for palliative care: Secondary | ICD-10-CM | POA: Diagnosis not present

## 2022-05-06 DIAGNOSIS — J9601 Acute respiratory failure with hypoxia: Secondary | ICD-10-CM | POA: Diagnosis not present

## 2022-05-06 DIAGNOSIS — J9602 Acute respiratory failure with hypercapnia: Secondary | ICD-10-CM | POA: Diagnosis not present

## 2022-05-06 LAB — CBC
HCT: 31.3 % — ABNORMAL LOW (ref 36.0–46.0)
Hemoglobin: 9 g/dL — ABNORMAL LOW (ref 12.0–15.0)
MCH: 32 pg (ref 26.0–34.0)
MCHC: 28.8 g/dL — ABNORMAL LOW (ref 30.0–36.0)
MCV: 111.4 fL — ABNORMAL HIGH (ref 80.0–100.0)
Platelets: 228 10*3/uL (ref 150–400)
RBC: 2.81 MIL/uL — ABNORMAL LOW (ref 3.87–5.11)
RDW: 18 % — ABNORMAL HIGH (ref 11.5–15.5)
WBC: 21.2 10*3/uL — ABNORMAL HIGH (ref 4.0–10.5)
nRBC: 0.7 % — ABNORMAL HIGH (ref 0.0–0.2)

## 2022-05-06 LAB — POTASSIUM: Potassium: 5.5 mmol/L — ABNORMAL HIGH (ref 3.5–5.1)

## 2022-05-06 LAB — LEGIONELLA PNEUMOPHILA SEROGP 1 UR AG: L. pneumophila Serogp 1 Ur Ag: NEGATIVE

## 2022-05-06 LAB — PROCALCITONIN: Procalcitonin: 0.96 ng/mL

## 2022-05-06 LAB — GLUCOSE, CAPILLARY: Glucose-Capillary: 211 mg/dL — ABNORMAL HIGH (ref 70–99)

## 2022-05-06 LAB — COMPREHENSIVE METABOLIC PANEL
ALT: 70 U/L — ABNORMAL HIGH (ref 0–44)
AST: 229 U/L — ABNORMAL HIGH (ref 15–41)
Albumin: 3.7 g/dL (ref 3.5–5.0)
Alkaline Phosphatase: 99 U/L (ref 38–126)
Anion gap: 13 (ref 5–15)
BUN: 64 mg/dL — ABNORMAL HIGH (ref 8–23)
CO2: 26 mmol/L (ref 22–32)
Calcium: 9.3 mg/dL (ref 8.9–10.3)
Chloride: 102 mmol/L (ref 98–111)
Creatinine, Ser: 1.28 mg/dL — ABNORMAL HIGH (ref 0.44–1.00)
GFR, Estimated: 43 mL/min — ABNORMAL LOW (ref >60)
Glucose, Bld: 151 mg/dL — ABNORMAL HIGH (ref 70–99)
Potassium: 6.2 mmol/L — ABNORMAL HIGH (ref 3.5–5.1)
Sodium: 141 mmol/L (ref 135–145)
Total Bilirubin: 2.9 mg/dL — ABNORMAL HIGH (ref 0.3–1.2)
Total Protein: 6.4 g/dL — ABNORMAL LOW (ref 6.5–8.1)

## 2022-05-06 MED ORDER — BIOTENE DRY MOUTH MT LIQD: 15.0000 mL | OROMUCOSAL | Status: DC | PRN

## 2022-05-06 MED ORDER — HALOPERIDOL LACTATE 2 MG/ML PO CONC: 0.5000 mg | ORAL | Status: DC | PRN

## 2022-05-06 MED ORDER — GLYCOPYRROLATE 0.2 MG/ML IJ SOLN
0.4000 mg | INTRAMUSCULAR | Status: DC
  Administered 2022-05-06: 0.4 mg via INTRAVENOUS
  Filled 2022-05-06: qty 2

## 2022-05-06 MED ORDER — HALOPERIDOL 0.5 MG PO TABS: 0.5000 mg | ORAL_TABLET | ORAL | Status: DC | PRN

## 2022-05-06 MED ORDER — NOREPINEPHRINE 4 MG/250ML-% IV SOLN
0.0000 ug/min | INTRAVENOUS | Status: DC
  Administered 2022-05-06: 2 ug/min via INTRAVENOUS

## 2022-05-06 MED ORDER — ONDANSETRON 4 MG PO TBDP: 4.0000 mg | ORAL_TABLET | Freq: Four times a day (QID) | ORAL | Status: DC | PRN

## 2022-05-06 MED ORDER — LORAZEPAM 2 MG/ML IJ SOLN: 1.0000 mg | INTRAMUSCULAR | Status: DC | PRN

## 2022-05-06 MED ORDER — SODIUM POLYSTYRENE SULFONATE 15 GM/60ML PO SUSP: 15.0000 g | Freq: Once | ORAL | Status: DC

## 2022-05-06 MED ORDER — MORPHINE 100MG IN NS 100ML (1MG/ML) PREMIX INFUSION
2.0000 mg/h | INTRAVENOUS | Status: DC
  Administered 2022-05-06: 2 mg/h via INTRAVENOUS
  Filled 2022-05-06: qty 100

## 2022-05-06 MED ORDER — LORAZEPAM 1 MG PO TABS: 1.0000 mg | ORAL_TABLET | ORAL | Status: DC | PRN

## 2022-05-06 MED ORDER — ONDANSETRON HCL 4 MG/2ML IJ SOLN: 4.0000 mg | Freq: Four times a day (QID) | INTRAMUSCULAR | Status: DC | PRN

## 2022-05-06 MED ORDER — CALCIUM GLUCONATE-NACL 1-0.675 GM/50ML-% IV SOLN
1.0000 g | Freq: Once | INTRAVENOUS | Status: AC
  Administered 2022-05-06: 1000 mg via INTRAVENOUS
  Filled 2022-05-06: qty 50

## 2022-05-06 MED ORDER — DEXTROSE 50 % IV SOLN
1.0000 | Freq: Once | INTRAVENOUS | Status: AC
  Administered 2022-05-06: 50 mL via INTRAVENOUS
  Filled 2022-05-06: qty 50

## 2022-05-06 MED ORDER — ACETAMINOPHEN 325 MG PO TABS: 650.0000 mg | ORAL_TABLET | Freq: Four times a day (QID) | ORAL | Status: DC | PRN

## 2022-05-06 MED ORDER — ACETAMINOPHEN 650 MG RE SUPP: 650.0000 mg | Freq: Four times a day (QID) | RECTAL | Status: DC | PRN

## 2022-05-06 MED ORDER — GLYCOPYRROLATE 0.2 MG/ML IJ SOLN: 0.2000 mg | INTRAMUSCULAR | Status: DC | PRN

## 2022-05-06 MED ORDER — MORPHINE BOLUS VIA INFUSION: 2.0000 mg | INTRAVENOUS | Status: DC | PRN

## 2022-05-06 MED ORDER — HALOPERIDOL LACTATE 5 MG/ML IJ SOLN: 0.5000 mg | INTRAMUSCULAR | Status: DC | PRN

## 2022-05-06 MED ORDER — POLYVINYL ALCOHOL 1.4 % OP SOLN: 1.0000 [drp] | Freq: Four times a day (QID) | OPHTHALMIC | Status: DC | PRN

## 2022-05-06 MED ORDER — GLYCOPYRROLATE 1 MG PO TABS: 1.0000 mg | ORAL_TABLET | ORAL | Status: DC | PRN

## 2022-05-06 MED ORDER — INSULIN ASPART 100 UNIT/ML IV SOLN
10.0000 [IU] | Freq: Once | INTRAVENOUS | Status: AC
  Administered 2022-05-06: 10 [IU] via INTRAVENOUS

## 2022-05-06 MED ORDER — LORAZEPAM 2 MG/ML PO CONC: 1.0000 mg | ORAL | Status: DC | PRN

## 2022-05-06 MED ORDER — MORPHINE SULFATE (PF) 2 MG/ML IV SOLN: 1.0000 mg | INTRAVENOUS | Status: DC | PRN

## 2022-05-08 NOTE — Addendum Note (Signed)
Addended by: Cheri Kearns A on: 05/08/2022 09:32 AM   Modules accepted: Level of Service

## 2022-05-08 NOTE — Progress Notes (Signed)
Remote pacemaker transmission.   

## 2022-05-09 LAB — CULTURE, BLOOD (ROUTINE X 2)
Culture: NO GROWTH
Culture: NO GROWTH
Special Requests: ADEQUATE

## 2022-05-15 ENCOUNTER — Ambulatory Visit (HOSPITAL_COMMUNITY): Payer: Medicare Other

## 2022-06-03 ENCOUNTER — Other Ambulatory Visit: Payer: Medicare Other

## 2022-06-03 ENCOUNTER — Ambulatory Visit: Payer: Medicare Other

## 2022-06-03 ENCOUNTER — Ambulatory Visit: Payer: Medicare Other | Admitting: Internal Medicine

## 2022-06-04 NOTE — Progress Notes (Addendum)
eLink Physician-Brief Progress Note Patient Name: Nancy Hodges DOB: Oct 18, 1945 MRN: [id number]   Date of Service  May 24, 2022  HPI/Events of Note  Notified of hyperkalemia with K 6.2.  Pt with AKI creatinine increasing from 0.54 --> 1.28.  eICU Interventions  Give calcium gluconate, insulin and D50 and kayexalate.  Hold spironolactone.      Intervention Category Intermediate Interventions: Electrolyte abnormality - evaluation and management  Elsie Lincoln 05-24-22, 6:13 AM  6:35 AM Pt could not tolerate being off BIPAP.   Plan> Hold off on kayexalate.

## 2022-06-04 NOTE — Death Summary Note (Signed)
DEATH SUMMARY   Patient Details  Name: Nancy Hodges MRN: [id number] DOB: [birth year] [place] Date:  May 10, 2022  Date of Death: Date of Death: 05-12-2022  Time of Death: Time of Death: 05-Oct-1112  Length of Stay: 2   Principle Cause of death: Acute on chronic hypoxemic/hypercapnic respiratory failure with pneumonitis/HCAP.  Hospital Diagnoses: Principal Problem:   Acute respiratory failure with hypoxia and hypercapnia (HCC) Active Problems:   Paroxysmal atrial fibrillation (HCC)   HCAP (healthcare-associated pneumonia)   Acute metabolic encephalopathy   COPD with acute exacerbation (HCC)   Sepsis ([place])   Pulmonary infiltrates   Hospital Course: 76 y.o. female with medical history significant of atrial flutter, anxiety, diverticulosis, GERD, lung cancer, persistent atrial fibrillation, presence of pacemaker admitted on 05/10/2022 with recurrent Multi-Focal Pneumonia.  She underwent treatment with cefepime as noted below along with Solu-Medrol and breathing treatments, but her condition continued to worsen.  She was seen by PCCM as well as palliative care on 10/2 and it was noted that she had very poor prognosis and was not expected to survive the evening.  She had worsening hypotension this morning and was put on higher doses of norepinephrine for blood pressure support and daughter was at bedside who agreed that she would like to transition her mother to comfort care as her prognosis was terminal.  The change to comfort care was made and within hours, patient expired at 11:14 AM.  Assessment and Plan: 1) Acute respiratory failure with hypercapnia and hypoxia due to Recurrent Bil  Multi-Focal PNA - Patient satting in mid 80s on nonrebreather at arrival -PCO2 was 70 with a pH of 7.32 Patient required Bipap, weaning to nasal cannula CTA chest on May 10, 2022----shows Extensive new interstitial and alveolar densities  are seen in both lungs suggesting multifocal pneumonia -Recently treated with IV cefepime and Levaquin -Currently, restarted on cefepime.  Vancomycin discontinued since MRSA PCR is negative -Continue bronchodilators and mucolytics -Lactic acid improved to 1.3 from 2.6 after IV fluids -Patient is immunocompromised due to underlying lung cancer with prior lung surgery chemoradiation therapy -Speech therapy evaluation requested due to concerns for recurrent aspiration -Since she has been unable to wean from BiPAP, pulmonology input requested   2)Sepsis due to Recurrent Bil  Multi-Focal PNA---POA -Patient met sepsis criteria on admission -Management as above #1 Patient is immunocompromised due to underlying lung cancer with prior lung surgery, chemotherapy and radiation -Strep pneumo antigen negative -Legionella antigen in process -Currently on cefepime   3)Liver Cirrhosis with Ascites------?? Etiology, not much of a drinker -No prior history of fatty liver -Viral hepatitis panel is negative --serum ammonia is 46,, Pt is somewhat sleepy -Give Lactulose -Given Lasix, Aldactone and Coreg -INR is elevated, but she is on Xarelto   4) acute diastolic congestive heart failure Pulmonary hypertension, RV pressure on echo 53.4 mmHg -Patient with bilateral pleural effusions and ascites -Lasix and Aldactone as above -We will also give albumin infusion prior to Lasix to help assist with diuresis -Echo shows preserved ejection fraction, elevated pulmonary pressures   5)COPD with acute exacerbation (HCC) -Secondary to # 1 above  -management as above #1 -Started on Solu-Medrol, bronchodilators   6)Acute metabolic encephalopathy -Multifactorial in the setting of hypercapnia and liver cirrhosis with mildly elevated ammonia -Management as above #1   7)Paroxysmal atrial fibrillation (HCC) Change diltiazem to short acting, which can be crushed, since she is having trouble swallowing long-acting  medication -She is on anticoagulation,  will change Xarelto to Lovenox she is having trouble swallowing on BiPAP -Coreg added due to liver cirrhosis with with ascites raising concerns for portal hypertension   8)Social/Ethics--- palliative care following -Patient is now DNR -Further decisions on goals of care based on patient progression   9)H/o Metastatic non-small cell lung cancer, adenocarcinoma. This was initially diagnosed as stage IB (T2a, N0, M0) in March of 2014, status post right upper lobectomy with lymph node dissection but the patient has evidence for disease recurrence in the right axilla status post resection.  --Patient previously treated with chemotherapy and radiation,  -currently on immunotherapy -Patient sees Dr. Lorna Few       Procedures: None  Consultations: Pulmonology, palliative  The results of significant diagnostics from this hospitalization (including imaging, microbiology, ancillary and laboratory) are listed below for reference.   Significant Diagnostic Studies: DG Chest Port 1 View  Result Date: 2022/05/19 CLINICAL DATA:  [id number].  Pneumonia follow-up. EXAM: PORTABLE CHEST 1 VIEW COMPARISON:  CTA chest and portable chest both 05/31/2022 FINDINGS: 4:53 a.m. The patient mildly rotated to the right. There is a stable right IJ port catheter with tip in the upper right atrium and a stable left chest dual lead pacing system and wire insertions with cylindrical electronic device again noted right atrium. Extensive widespread bilateral airspace disease, left-greater-than-right again is noted with interval increased confluent density in the left upper lung field. There are moderate pleural effusions. There is cardiomegaly with at least mild central vascular congestion noted. Difficult to exclude a least a partial component of airspace edema. There are surgical sutures along the right hilum. The mediastinum is stable. IMPRESSION: Diffuse bilateral airspace disease  left-greater-than-right with increasing density of infiltrates left upper lung field, otherwise not significantly changed, with moderate layering pleural effusions underlying. Electronically Signed   By: Telford Nab M.D.   On: 05/19/22 05:20   ECHOCARDIOGRAM COMPLETE  Result Date: 05/05/2022    ECHOCARDIOGRAM REPORT   Patient Name:   DORSIE SETHI Date of Exam: 05/05/2022 Medical Rec #:  [id number]          Height:       62.0 in Accession #:    [id number]         Weight:       134.0 lb Date of Birth:  07-Apr-1946          BSA:          1.613 m Patient Age:    50 years           BP:           112/45 mmHg Patient Gender: F                  HR:           101 bpm. Exam Location:  [facility] Procedure: 2D Echo, Cardiac Doppler and Color Doppler Indications:    Abnormal ECG  History:        Patient has prior history of Echocardiogram examinations, most                 recent 10/28/2017. COPD; Arrythmias:Atrial Fibrillation,                 Tachycardia and Bradycardia. Lung CA, Lobectomy, Port wire                 visualized in RA.  Sonographer:    Wenda Low Referring Phys: [id number] COURAGE EMOKPAE  Sonographer Comments: Image acquisition challenging  due to patient behavioral factors. and Image acquisition challenging due to uncooperative patient. IMPRESSIONS  1. Left ventricular ejection fraction, by estimation, is 60 to 65%. The left ventricle has normal function. The left ventricle has no regional wall motion abnormalities. Left ventricular diastolic parameters are indeterminate.  2. Right ventricular systolic function is normal. The right ventricular size is normal. There is moderately elevated pulmonary artery systolic pressure.  3. Left atrial size was severely dilated.  4. Right atrial size was moderately dilated.  5. Trivial mitral valve regurgitation.  6. The aortic valve was not well visualized. Aortic valve regurgitation is mild.  7. The inferior vena cava is normal in size with greater than 50%  respiratory variability, suggesting right atrial pressure of 3 mmHg. FINDINGS  Left Ventricle: Left ventricular ejection fraction, by estimation, is 60 to 65%. The left ventricle has normal function. The left ventricle has no regional wall motion abnormalities. The left ventricular internal cavity size was normal in size. There is  no left ventricular hypertrophy. Left ventricular diastolic parameters are indeterminate. Right Ventricle: The right ventricular size is normal. No increase in right ventricular wall thickness. Right ventricular systolic function is normal. There is moderately elevated pulmonary artery systolic pressure. The tricuspid regurgitant velocity is 3.37 m/s, and with an assumed right atrial pressure of 8 mmHg, the estimated right ventricular systolic pressure is 06.3 mmHg. Left Atrium: Left atrial size was severely dilated. Right Atrium: Right atrial size was moderately dilated. Pericardium: There is no evidence of pericardial effusion. Mitral Valve: There is mild thickening of the anterior mitral valve leaflet(s). There is mild calcification of the anterior mitral valve leaflet(s). Mildly decreased mobility of the mitral valve leaflets. Trivial mitral valve regurgitation. MV peak gradient, 26.8 mmHg. The mean mitral valve gradient is 7.0 mmHg. Tricuspid Valve: Tricuspid valve regurgitation is mild. Aortic Valve: The aortic valve was not well visualized. Aortic valve regurgitation is mild. Aortic valve mean gradient measures 4.0 mmHg. Aortic valve peak gradient measures 8.2 mmHg. Aortic valve area, by VTI measures 1.70 cm. Pulmonic Valve: The pulmonic valve was not well visualized. Pulmonic valve regurgitation is not visualized. Aorta: The aortic root and ascending aorta are structurally normal, with no evidence of dilitation. Venous: The inferior vena cava is normal in size with greater than 50% respiratory variability, suggesting right atrial pressure of 3 mmHg. IAS/Shunts: The interatrial  septum was not well visualized.  LEFT VENTRICLE PLAX 2D LVIDd:         4.20 cm   Diastology LVIDs:         2.30 cm   LV e' medial:    9.90 cm/s LV PW:         0.70 cm   LV E/e' medial:  16.4 LV IVS:        0.70 cm   LV e' lateral:   14.10 cm/s LVOT diam:     1.65 cm   LV E/e' lateral: 11.5 LV SV:         43 LV SV Index:   27 LVOT Area:     2.14 cm  RIGHT VENTRICLE RV Basal diam:  3.30 cm RV Mid diam:    2.80 cm RV S prime:     13.30 cm/s TAPSE (M-mode): 2.1 cm LEFT ATRIUM             Index        RIGHT ATRIUM           Index LA diam:  3.70 cm 2.29 cm/m   RA Area:     20.70 cm LA Vol (A2C):   77.3 ml 47.93 ml/m  RA Volume:   61.10 ml  37.89 ml/m LA Vol (A4C):   73.2 ml 45.39 ml/m LA Biplane Vol: 78.1 ml 48.43 ml/m  AORTIC VALVE AV Area (Vmax):    1.73 cm AV Area (Vmean):   1.67 cm AV Area (VTI):     1.70 cm AV Vmax:           143.00 cm/s AV Vmean:          88.700 cm/s AV VTI:            0.255 m AV Peak Grad:      8.2 mmHg AV Mean Grad:      4.0 mmHg LVOT Vmax:         116.00 cm/s LVOT Vmean:        69.100 cm/s LVOT VTI:          0.203 m LVOT/AV VTI ratio: 0.80  AORTA Ao Root diam: 2.80 cm MITRAL VALVE                TRICUSPID VALVE MV Area (PHT): 3.08 cm     TR Peak grad:   45.4 mmHg MV Area VTI:   1.02 cm     TR Vmax:        337.00 cm/s MV Peak grad:  26.8 mmHg MV Mean grad:  7.0 mmHg     SHUNTS MV Vmax:       2.59 m/s     Systemic VTI:  0.20 m MV Vmean:      108.0 cm/s   Systemic Diam: 1.65 cm MV Decel Time: 246 msec MV E velocity: 162.00 cm/s Phineas Inches Electronically signed by Phineas Inches Signature Date/Time: 05/05/2022/12:06:32 PM    Final    CT Angio Chest Pulmonary Embolism (PE) W or WO Contrast  Result Date: 05/21/2022 CLINICAL DATA:  Chronic dyspnea EXAM: CT ANGIOGRAPHY CHEST WITH CONTRAST TECHNIQUE: Multidetector CT imaging of the chest was performed using the standard protocol during bolus administration of intravenous contrast. Multiplanar CT image reconstructions and MIPs were  obtained to evaluate the vascular anatomy. RADIATION DOSE REDUCTION: This exam was performed according to the departmental dose-optimization program which includes automated exposure control, adjustment of the mA and/or kV according to patient size and/or use of iterative reconstruction technique. CONTRAST:  79m OMNIPAQUE IOHEXOL 350 MG/ML SOLN COMPARISON:  Previous CT done on 01/09/2022, chest radiograph done earlier today FINDINGS: Cardiovascular: There are no intraluminal filling defects in central pulmonary artery branches. Evaluation of small peripheral branches is limited by extensive infiltrates and breathing motion artifacts. There is homogeneous enhancement in thoracic aorta. Coronary artery calcifications are seen. Heart is enlarged in size. Pacer leads are noted in place. Mediastinum/Nodes: Enlarged lymph nodes are seen in mediastinum and hilar regions. There is possible edema in mediastinal fat planes. Lungs/Pleura: Extensive new patchy interstitial and alveolar densities are seen in both lungs. Large bilateral pleural effusions are seen, more so on the left side. There is no pneumothorax. Upper Abdomen: There is nodularity in liver surface suggesting cirrhosis. Gallbladder stones are seen. Moderate ascites is present in the upper abdomen. Musculoskeletal: There is edema in subcutaneous plane suggesting anasarca. Review of the MIP images confirms the above findings. IMPRESSION: There is no evidence of central pulmonary artery embolism. There is no evidence of thoracic aortic dissection. Atherosclerotic changes are noted in thoracic aorta. Coronary artery calcifications are  seen. Extensive new interstitial and alveolar densities are seen in both lungs suggesting multifocal pneumonia and possibly pulmonary edema. There is interval appearance of large bilateral pleural effusions, more so on the left side. There is increased density in mediastinum which may suggest lymphadenopathy and possibly edema.  Cirrhosis of liver.  Gallbladder stones.  Moderate ascites. Other findings as described in the body of the report. Electronically Signed   By: Elmer Picker M.D.   On: 06/02/2022 10:23   DG Chest Portable 1 View  Result Date: 05/21/2022 CLINICAL DATA:  Shortness of breath EXAM: PORTABLE CHEST 1 VIEW COMPARISON:  04/03/2019 FINDINGS: Cardiac shadow is stable. Pacing device is again seen. Right chest wall port and loop recorder are again noted. Lungs are well aerated bilaterally. Persistent opacities are noted in the bases bilaterally similar to that seen on prior exam although new patchy opacities are noted throughout both lungs particularly in the left upper lobe likely representing more acute infiltrate. IMPRESSION: Significant increase in airspace opacities particularly in the left upper lobe as described. Electronically Signed   By: Inez Catalina M.D.   On: 05/17/2022 01:38   CUP PACEART REMOTE DEVICE CHECK  Result Date: 05/02/2022 Scheduled remote reviewed. Normal device function.  Permanent AF, Xarelto Next remote 91 days. [place]   Microbiology: Recent Results (from the past 240 hour(s))  Blood culture (routine x 2)     Status: None (Preliminary result)   Collection Time: 05/21/2022  2:38 AM   Specimen: BLOOD RIGHT FOREARM  Result Value Ref Range Status   Specimen Description BLOOD RIGHT FOREARM BOTTLES DRAWN AEROBIC ONLY  Final   Special Requests   Final    Blood Culture results may not be optimal due to an inadequate volume of blood received in culture bottles   Culture   Final    NO GROWTH 2 DAYS Performed at [facility], [address] [place], [place] [address]    Report Status PENDING  Incomplete  Blood culture (routine x 2)     Status: None (Preliminary result)   Collection Time: 05/05/2022  2:51 AM   Specimen: Site Not Specified; Blood  Result Value Ref Range Status   Specimen Description   Final    SITE NOT SPECIFIED BOTTLES DRAWN AEROBIC AND ANAEROBIC   Special Requests  Blood Culture adequate volume  Final   Culture   Final    NO GROWTH 2 DAYS Performed at [facility], [address] [place], [place] [address]    Report Status PENDING  Incomplete  MRSA Next Gen by PCR, Nasal     Status: None   Collection Time: 05/08/2022  2:27 PM   Specimen: Anterior Nasal Swab  Result Value Ref Range Status   MRSA by PCR Next Gen NOT DETECTED NOT DETECTED Final    Comment: (NOTE) The GeneXpert MRSA Assay (FDA approved for NASAL specimens only), is one component of a comprehensive MRSA colonization surveillance program. It is not intended to diagnose MRSA infection nor to guide or monitor treatment for MRSA infections. Test performance is not FDA approved in patients less than 15 years old. Performed at [facility], [address] [place], [place] [address]   SARS Coronavirus 2 by RT PCR (hospital order, performed in [facility] hospital lab) *cepheid single result test* Anterior Nasal Swab     Status: None   Collection Time: 05/18/2022  2:30 PM   Specimen: Anterior Nasal Swab  Result Value Ref Range Status   SARS Coronavirus 2 by RT  PCR NEGATIVE NEGATIVE Final    Comment: (NOTE) SARS-CoV-2 target nucleic acids are NOT DETECTED.  The SARS-CoV-2 RNA is generally detectable in upper and lower respiratory specimens during the acute phase of infection. The lowest concentration of SARS-CoV-2 viral copies this assay can detect is 250 copies / mL. A negative result does not preclude SARS-CoV-2 infection and should not be used as the sole basis for treatment or other patient management decisions.  A negative result may occur with improper specimen collection / handling, submission of specimen other than nasopharyngeal swab, presence of viral mutation(s) within the areas targeted by this assay, and inadequate number of viral copies (<250 copies / mL). A negative result must be combined with clinical observations, patient history, and epidemiological information.  Fact  Sheet for Patients:   https://www.patel.info/  Fact Sheet for Healthcare Providers: https://hall.com/  This test is not yet approved or  cleared by the [place] FDA and has been authorized for detection and/or diagnosis of SARS-CoV-2 by FDA under an Emergency Use Authorization (EUA).  This EUA will remain in effect (meaning this test can be used) for the duration of the COVID-19 declaration under Section 564(b)(1) of the Act, 21 U.S.C. section 360bbb-3(b)(1), unless the authorization is terminated or revoked sooner.  Performed at [facility], [address] [place], [place] [address]     Time spent: 30 minutes  Signed: Rodena Goldmann, DO 2022-05-07

## 2022-06-04 NOTE — Progress Notes (Signed)
Palliative:  HPI: 76 y.o. female  with past medical history of stroke with right-sided weakness, lung cancer (monthly Nivolumab infusions since 2018), COPD, atrial flutter/fibrillation, s/p pacemaker, anxiety, diverticulosis, GERD admitted on 05/07/2022 with shortness of breath requiring BiPAP due to multifocal pneumonia and COPD exacerbation. Concern for new CHF and liver cirrhosis. Recently admitted 04/03/22-04/05/22 with pneumonia and noted significant weigh loss and weakness over past month prior to admission. Reviewed Advance Directive that is uploaded into electronic chart.   I met today at bedside with Ms. Wedge and daughter, Helene Kelp, multiple times throughout the day. Acute decline overnight and Ms. Beaufort appears to be in the process of dying. Helene Kelp understands and agrees with comfort care. We discuss what this means and looks like. We discussed symptom management medications to maintain comfort. Discussed plan for maintaining current interventions, no escalation, awaiting other family en-route to visit. Once family arrives we will begin to liberate from life prolonging interventions that may be causing suffering such as vasopressors and BiPAP. I revisited to provide comfort medication with plan to remove BiPAP once comfort medications onboard. Ms. Marron peacefully died prior to removing BiPAP. I visited and provided condolences to family at bedside.   All questions/concerns addressed. Emotional support provided.   Plan: - Transition to comfort care and she died peacefully.  - Comfort medications were discussed and ordered.  - Spiritual care requested for additional support.   [place], NP Palliative Medicine Team Pager [phone] (Please see [email or web address] for schedule) Team Phone [phone]    Greater than 50%  of this time was spent counseling and coordinating care related to the above assessment and plan

## 2022-06-04 NOTE — Progress Notes (Signed)
Patient placed on Comfort care this morning. Family at bedside and education/emotional support provided and expressed full understanding of comfort measures. Comfort tray provided for family. New orders placed and patient transitioned to comfort care with no signs or symptoms of pain, discomfort or distress. Daughter, Helene Kelp to take patient belongings (clothes) home. Time of death called at 1114am with two RN check. No pulse felt or heartbeat/breath sounds present upon auscultation. Family aware, emotional support provided. Albin Felling, Dr Manuella Ghazi and Fenton Malling NP made aware. Respiratory Therapist, Fenton Malling NP and chaplain at patient bedside with writer and family.

## 2022-06-04 DEATH — deceased
